# Patient Record
Sex: Female | Born: 1945 | Race: White | Hispanic: No | Marital: Married | Smoking: Current every day smoker
Health system: Southern US, Community
[De-identification: ages and names within clinical notes are randomized; demographics above are authoritative.]

## PROBLEM LIST (undated history)

## (undated) DIAGNOSIS — I2510 Atherosclerotic heart disease of native coronary artery without angina pectoris: Secondary | ICD-10-CM

## (undated) DIAGNOSIS — F419 Anxiety disorder, unspecified: Secondary | ICD-10-CM

## (undated) DIAGNOSIS — E119 Type 2 diabetes mellitus without complications: Secondary | ICD-10-CM

## (undated) DIAGNOSIS — R062 Wheezing: Secondary | ICD-10-CM

## (undated) DIAGNOSIS — K589 Irritable bowel syndrome without diarrhea: Secondary | ICD-10-CM

## (undated) DIAGNOSIS — I10 Essential (primary) hypertension: Secondary | ICD-10-CM

## (undated) DIAGNOSIS — M720 Palmar fascial fibromatosis [Dupuytren]: Secondary | ICD-10-CM

## (undated) DIAGNOSIS — K219 Gastro-esophageal reflux disease without esophagitis: Secondary | ICD-10-CM

## (undated) DIAGNOSIS — M1990 Unspecified osteoarthritis, unspecified site: Secondary | ICD-10-CM

## (undated) DIAGNOSIS — Z9889 Other specified postprocedural states: Secondary | ICD-10-CM

## (undated) DIAGNOSIS — E559 Vitamin D deficiency, unspecified: Secondary | ICD-10-CM

## (undated) DIAGNOSIS — Z22322 Carrier or suspected carrier of Methicillin resistant Staphylococcus aureus: Secondary | ICD-10-CM

## (undated) DIAGNOSIS — I5022 Chronic systolic (congestive) heart failure: Secondary | ICD-10-CM

## (undated) DIAGNOSIS — I219 Acute myocardial infarction, unspecified: Secondary | ICD-10-CM

## (undated) DIAGNOSIS — F32A Depression, unspecified: Secondary | ICD-10-CM

## (undated) DIAGNOSIS — J449 Chronic obstructive pulmonary disease, unspecified: Secondary | ICD-10-CM

## (undated) DIAGNOSIS — G709 Myoneural disorder, unspecified: Secondary | ICD-10-CM

## (undated) DIAGNOSIS — E11319 Type 2 diabetes mellitus with unspecified diabetic retinopathy without macular edema: Secondary | ICD-10-CM

## (undated) DIAGNOSIS — B019 Varicella without complication: Secondary | ICD-10-CM

## (undated) DIAGNOSIS — M810 Age-related osteoporosis without current pathological fracture: Secondary | ICD-10-CM

## (undated) DIAGNOSIS — E785 Hyperlipidemia, unspecified: Secondary | ICD-10-CM

## (undated) DIAGNOSIS — C4490 Unspecified malignant neoplasm of skin, unspecified: Secondary | ICD-10-CM

## (undated) DIAGNOSIS — I639 Cerebral infarction, unspecified: Secondary | ICD-10-CM

## (undated) DIAGNOSIS — J40 Bronchitis, not specified as acute or chronic: Secondary | ICD-10-CM

## (undated) DIAGNOSIS — I5189 Other ill-defined heart diseases: Secondary | ICD-10-CM

## (undated) DIAGNOSIS — N390 Urinary tract infection, site not specified: Secondary | ICD-10-CM

## (undated) DIAGNOSIS — I4510 Unspecified right bundle-branch block: Secondary | ICD-10-CM

## (undated) DIAGNOSIS — G459 Transient cerebral ischemic attack, unspecified: Secondary | ICD-10-CM

## (undated) DIAGNOSIS — J189 Pneumonia, unspecified organism: Secondary | ICD-10-CM

## (undated) DIAGNOSIS — F329 Major depressive disorder, single episode, unspecified: Secondary | ICD-10-CM

## (undated) HISTORY — DX: Vitamin D deficiency, unspecified: E55.9

## (undated) HISTORY — DX: Pneumonia, unspecified organism: J18.9

## (undated) HISTORY — DX: Age-related osteoporosis without current pathological fracture: M81.0

## (undated) HISTORY — DX: Hyperlipidemia, unspecified: E78.5

## (undated) HISTORY — DX: Type 2 diabetes mellitus without complications: E11.9

## (undated) HISTORY — PX: COLONOSCOPY: SHX5424

## (undated) HISTORY — DX: Major depressive disorder, single episode, unspecified: F32.9

## (undated) HISTORY — DX: Irritable bowel syndrome, unspecified: K58.9

## (undated) HISTORY — DX: Other specified postprocedural states: Z98.890

## (undated) HISTORY — DX: Type 2 diabetes mellitus with unspecified diabetic retinopathy without macular edema: E11.319

## (undated) HISTORY — DX: Varicella without complication: B01.9

## (undated) HISTORY — DX: Carrier or suspected carrier of methicillin resistant Staphylococcus aureus: Z22.322

## (undated) HISTORY — DX: Palmar fascial fibromatosis (dupuytren): M72.0

## (undated) HISTORY — DX: Depression, unspecified: F32.A

## (undated) HISTORY — PX: BACK SURGERY: SHX140

## (undated) HISTORY — PX: CATARACT EXTRACTION, BILATERAL: SHX1313

## (undated) HISTORY — PX: REPAIR DURAL / CSF LEAK: SUR1169

## (undated) HISTORY — PX: EYE SURGERY: SHX253

## (undated) HISTORY — DX: Cerebral infarction, unspecified: I63.9

---

## 1992-03-08 DIAGNOSIS — I219 Acute myocardial infarction, unspecified: Secondary | ICD-10-CM

## 1992-03-08 HISTORY — DX: Acute myocardial infarction, unspecified: I21.9

## 1992-03-08 HISTORY — PX: CARDIAC CATHETERIZATION: SHX172

## 1992-03-08 HISTORY — PX: CORONARY ANGIOPLASTY: SHX604

## 1997-10-21 ENCOUNTER — Encounter: Admission: RE | Admit: 1997-10-21 | Discharge: 1998-01-19 | Payer: Self-pay | Admitting: Family Medicine

## 1998-05-05 ENCOUNTER — Ambulatory Visit (HOSPITAL_COMMUNITY): Admission: RE | Admit: 1998-05-05 | Discharge: 1998-05-05 | Payer: Self-pay | Admitting: Orthopedic Surgery

## 1998-05-05 ENCOUNTER — Encounter: Payer: Self-pay | Admitting: Orthopedic Surgery

## 1998-05-06 ENCOUNTER — Encounter: Payer: Self-pay | Admitting: Orthopedic Surgery

## 1998-05-06 ENCOUNTER — Ambulatory Visit (HOSPITAL_COMMUNITY): Admission: RE | Admit: 1998-05-06 | Discharge: 1998-05-06 | Payer: Self-pay | Admitting: Orthopedic Surgery

## 1999-10-17 ENCOUNTER — Encounter: Payer: Self-pay | Admitting: Emergency Medicine

## 1999-10-17 ENCOUNTER — Emergency Department (HOSPITAL_COMMUNITY): Admission: EM | Admit: 1999-10-17 | Discharge: 1999-10-17 | Payer: Self-pay | Admitting: Emergency Medicine

## 1999-10-23 ENCOUNTER — Encounter: Admission: RE | Admit: 1999-10-23 | Discharge: 1999-10-23 | Payer: Self-pay | Admitting: Family Medicine

## 1999-10-23 ENCOUNTER — Encounter: Payer: Self-pay | Admitting: Family Medicine

## 1999-11-04 ENCOUNTER — Encounter: Admission: RE | Admit: 1999-11-04 | Discharge: 1999-11-04 | Payer: Self-pay | Admitting: Family Medicine

## 1999-11-04 ENCOUNTER — Encounter: Payer: Self-pay | Admitting: Family Medicine

## 1999-12-09 ENCOUNTER — Encounter: Admission: RE | Admit: 1999-12-09 | Discharge: 1999-12-09 | Payer: Self-pay | Admitting: Family Medicine

## 1999-12-09 ENCOUNTER — Encounter: Payer: Self-pay | Admitting: Family Medicine

## 1999-12-10 ENCOUNTER — Inpatient Hospital Stay (HOSPITAL_COMMUNITY): Admission: AD | Admit: 1999-12-10 | Discharge: 1999-12-16 | Payer: Self-pay | Admitting: Family Medicine

## 1999-12-11 ENCOUNTER — Encounter: Payer: Self-pay | Admitting: Family Medicine

## 1999-12-13 ENCOUNTER — Encounter: Payer: Self-pay | Admitting: Family Medicine

## 1999-12-15 ENCOUNTER — Encounter: Payer: Self-pay | Admitting: Family Medicine

## 1999-12-24 ENCOUNTER — Encounter: Admission: RE | Admit: 1999-12-24 | Discharge: 1999-12-24 | Payer: Self-pay | Admitting: Family Medicine

## 2000-02-23 ENCOUNTER — Encounter: Admission: RE | Admit: 2000-02-23 | Discharge: 2000-02-23 | Payer: Self-pay | Admitting: Family Medicine

## 2000-02-23 ENCOUNTER — Encounter: Payer: Self-pay | Admitting: Family Medicine

## 2000-02-29 ENCOUNTER — Encounter: Payer: Self-pay | Admitting: Neurological Surgery

## 2000-02-29 ENCOUNTER — Ambulatory Visit (HOSPITAL_COMMUNITY): Admission: RE | Admit: 2000-02-29 | Discharge: 2000-02-29 | Payer: Self-pay | Admitting: Neurological Surgery

## 2000-05-04 ENCOUNTER — Encounter: Admission: RE | Admit: 2000-05-04 | Discharge: 2000-05-04 | Payer: Self-pay | Admitting: Neurological Surgery

## 2000-05-04 ENCOUNTER — Encounter: Payer: Self-pay | Admitting: Neurological Surgery

## 2000-05-11 ENCOUNTER — Encounter: Admission: RE | Admit: 2000-05-11 | Discharge: 2000-06-27 | Payer: Self-pay | Admitting: Neurological Surgery

## 2000-07-05 ENCOUNTER — Encounter: Payer: Self-pay | Admitting: Neurological Surgery

## 2000-07-05 ENCOUNTER — Ambulatory Visit (HOSPITAL_COMMUNITY): Admission: RE | Admit: 2000-07-05 | Discharge: 2000-07-05 | Payer: Self-pay | Admitting: Neurological Surgery

## 2002-04-24 ENCOUNTER — Ambulatory Visit (HOSPITAL_COMMUNITY): Admission: RE | Admit: 2002-04-24 | Discharge: 2002-04-24 | Payer: Self-pay | Admitting: Family Medicine

## 2002-06-11 ENCOUNTER — Other Ambulatory Visit: Admission: RE | Admit: 2002-06-11 | Discharge: 2002-06-11 | Payer: Self-pay | Admitting: Family Medicine

## 2002-06-20 ENCOUNTER — Encounter: Admission: RE | Admit: 2002-06-20 | Discharge: 2002-06-20 | Payer: Self-pay | Admitting: Family Medicine

## 2002-06-20 ENCOUNTER — Encounter: Payer: Self-pay | Admitting: Family Medicine

## 2004-09-17 ENCOUNTER — Other Ambulatory Visit: Admission: RE | Admit: 2004-09-17 | Discharge: 2004-09-17 | Payer: Self-pay | Admitting: Family Medicine

## 2006-01-17 ENCOUNTER — Other Ambulatory Visit: Admission: RE | Admit: 2006-01-17 | Discharge: 2006-01-17 | Payer: Self-pay | Admitting: Family Medicine

## 2009-03-08 DIAGNOSIS — Z9889 Other specified postprocedural states: Secondary | ICD-10-CM

## 2009-03-08 DIAGNOSIS — M720 Palmar fascial fibromatosis [Dupuytren]: Secondary | ICD-10-CM

## 2009-03-08 HISTORY — PX: DUPUYTREN CONTRACTURE RELEASE: SHX1478

## 2009-03-08 HISTORY — PX: HAND SURGERY: SHX662

## 2009-03-08 HISTORY — DX: Palmar fascial fibromatosis (dupuytren): M72.0

## 2009-03-08 HISTORY — DX: Other specified postprocedural states: Z98.890

## 2010-05-15 ENCOUNTER — Ambulatory Visit: Payer: Self-pay

## 2010-06-23 ENCOUNTER — Ambulatory Visit: Payer: Self-pay | Admitting: Physician Assistant

## 2012-03-08 DIAGNOSIS — G459 Transient cerebral ischemic attack, unspecified: Secondary | ICD-10-CM

## 2012-03-08 DIAGNOSIS — J189 Pneumonia, unspecified organism: Secondary | ICD-10-CM

## 2012-03-08 HISTORY — DX: Transient cerebral ischemic attack, unspecified: G45.9

## 2012-03-08 HISTORY — DX: Pneumonia, unspecified organism: J18.9

## 2012-07-24 DIAGNOSIS — J189 Pneumonia, unspecified organism: Secondary | ICD-10-CM | POA: Insufficient documentation

## 2012-07-24 DIAGNOSIS — I259 Chronic ischemic heart disease, unspecified: Secondary | ICD-10-CM | POA: Insufficient documentation

## 2012-07-24 DIAGNOSIS — E119 Type 2 diabetes mellitus without complications: Secondary | ICD-10-CM | POA: Insufficient documentation

## 2012-07-24 DIAGNOSIS — I240 Acute coronary thrombosis not resulting in myocardial infarction: Secondary | ICD-10-CM | POA: Insufficient documentation

## 2012-07-24 DIAGNOSIS — R0902 Hypoxemia: Secondary | ICD-10-CM | POA: Insufficient documentation

## 2012-07-30 DIAGNOSIS — I5022 Chronic systolic (congestive) heart failure: Secondary | ICD-10-CM | POA: Insufficient documentation

## 2012-08-17 DIAGNOSIS — E785 Hyperlipidemia, unspecified: Secondary | ICD-10-CM | POA: Insufficient documentation

## 2012-08-17 DIAGNOSIS — J449 Chronic obstructive pulmonary disease, unspecified: Secondary | ICD-10-CM | POA: Insufficient documentation

## 2012-08-17 DIAGNOSIS — M810 Age-related osteoporosis without current pathological fracture: Secondary | ICD-10-CM | POA: Insufficient documentation

## 2012-08-17 DIAGNOSIS — Z794 Long term (current) use of insulin: Secondary | ICD-10-CM | POA: Insufficient documentation

## 2012-08-17 DIAGNOSIS — E113293 Type 2 diabetes mellitus with mild nonproliferative diabetic retinopathy without macular edema, bilateral: Secondary | ICD-10-CM | POA: Insufficient documentation

## 2012-08-17 DIAGNOSIS — E1169 Type 2 diabetes mellitus with other specified complication: Secondary | ICD-10-CM | POA: Insufficient documentation

## 2012-08-17 DIAGNOSIS — D649 Anemia, unspecified: Secondary | ICD-10-CM | POA: Insufficient documentation

## 2012-11-23 DIAGNOSIS — Z86010 Personal history of colonic polyps: Secondary | ICD-10-CM | POA: Insufficient documentation

## 2012-11-23 DIAGNOSIS — Z7982 Long term (current) use of aspirin: Secondary | ICD-10-CM | POA: Insufficient documentation

## 2012-11-23 DIAGNOSIS — Z8673 Personal history of transient ischemic attack (TIA), and cerebral infarction without residual deficits: Secondary | ICD-10-CM | POA: Insufficient documentation

## 2012-11-23 DIAGNOSIS — I4510 Unspecified right bundle-branch block: Secondary | ICD-10-CM | POA: Insufficient documentation

## 2012-11-23 DIAGNOSIS — I252 Old myocardial infarction: Secondary | ICD-10-CM | POA: Insufficient documentation

## 2012-11-23 DIAGNOSIS — Z9861 Coronary angioplasty status: Secondary | ICD-10-CM | POA: Insufficient documentation

## 2013-06-04 ENCOUNTER — Ambulatory Visit: Payer: Self-pay | Admitting: Ophthalmology

## 2013-06-04 DIAGNOSIS — I10 Essential (primary) hypertension: Secondary | ICD-10-CM

## 2013-06-04 LAB — HEMOGLOBIN: HGB: 10.3 g/dL — ABNORMAL LOW (ref 12.0–16.0)

## 2013-06-04 LAB — POTASSIUM: Potassium: 4.3 mmol/L (ref 3.5–5.1)

## 2013-06-11 ENCOUNTER — Ambulatory Visit: Payer: Self-pay | Admitting: Ophthalmology

## 2013-10-25 ENCOUNTER — Ambulatory Visit: Payer: Self-pay | Admitting: Ophthalmology

## 2013-10-25 LAB — POTASSIUM: Potassium: 4.2 mmol/L (ref 3.5–5.1)

## 2013-10-25 LAB — HEMOGLOBIN: HGB: 10 g/dL — ABNORMAL LOW (ref 12.0–16.0)

## 2013-11-05 ENCOUNTER — Ambulatory Visit: Payer: Self-pay | Admitting: Ophthalmology

## 2013-11-06 HISTORY — PX: HIP FRACTURE SURGERY: SHX118

## 2013-11-25 ENCOUNTER — Inpatient Hospital Stay: Payer: Self-pay | Admitting: Internal Medicine

## 2013-11-25 LAB — CBC WITH DIFFERENTIAL/PLATELET
Basophil #: 0 10*3/uL (ref 0.0–0.1)
Basophil %: 0.4 %
Eosinophil #: 0 10*3/uL (ref 0.0–0.7)
Eosinophil %: 0.1 %
HCT: 36.7 % (ref 35.0–47.0)
HGB: 11.8 g/dL — ABNORMAL LOW (ref 12.0–16.0)
Lymphocyte #: 0.7 10*3/uL — ABNORMAL LOW (ref 1.0–3.6)
Lymphocyte %: 8.4 %
MCH: 29.6 pg (ref 26.0–34.0)
MCHC: 32.1 g/dL (ref 32.0–36.0)
MCV: 92 fL (ref 80–100)
Monocyte #: 0.3 x10 3/mm (ref 0.2–0.9)
Monocyte %: 3.9 %
Neutrophil #: 7.5 10*3/uL — ABNORMAL HIGH (ref 1.4–6.5)
Neutrophil %: 87.2 %
Platelet: 198 10*3/uL (ref 150–440)
RBC: 3.97 10*6/uL (ref 3.80–5.20)
RDW: 14.7 % — ABNORMAL HIGH (ref 11.5–14.5)
WBC: 8.6 10*3/uL (ref 3.6–11.0)

## 2013-11-25 LAB — BASIC METABOLIC PANEL
Anion Gap: 6 — ABNORMAL LOW (ref 7–16)
BUN: 45 mg/dL — ABNORMAL HIGH (ref 7–18)
Calcium, Total: 8.9 mg/dL (ref 8.5–10.1)
Chloride: 97 mmol/L — ABNORMAL LOW (ref 98–107)
Co2: 31 mmol/L (ref 21–32)
Creatinine: 1.52 mg/dL — ABNORMAL HIGH (ref 0.60–1.30)
EGFR (African American): 40 — ABNORMAL LOW
EGFR (Non-African Amer.): 35 — ABNORMAL LOW
Glucose: 341 mg/dL — ABNORMAL HIGH (ref 65–99)
Osmolality: 293 (ref 275–301)
Potassium: 6.2 mmol/L — ABNORMAL HIGH (ref 3.5–5.1)
Sodium: 134 mmol/L — ABNORMAL LOW (ref 136–145)

## 2013-11-25 LAB — POTASSIUM: POTASSIUM: 5.5 mmol/L — AB (ref 3.5–5.1)

## 2013-11-26 LAB — CBC WITH DIFFERENTIAL/PLATELET
BASOS PCT: 0.6 %
Basophil #: 0 10*3/uL (ref 0.0–0.1)
Eosinophil #: 0.1 10*3/uL (ref 0.0–0.7)
Eosinophil %: 1.5 %
HCT: 29 % — AB (ref 35.0–47.0)
HGB: 9.3 g/dL — ABNORMAL LOW (ref 12.0–16.0)
Lymphocyte #: 1.1 10*3/uL (ref 1.0–3.6)
Lymphocyte %: 17 %
MCH: 29.6 pg (ref 26.0–34.0)
MCHC: 32 g/dL (ref 32.0–36.0)
MCV: 92 fL (ref 80–100)
Monocyte #: 0.6 x10 3/mm (ref 0.2–0.9)
Monocyte %: 9.2 %
Neutrophil #: 4.8 10*3/uL (ref 1.4–6.5)
Neutrophil %: 71.7 %
PLATELETS: 184 10*3/uL (ref 150–440)
RBC: 3.14 10*6/uL — AB (ref 3.80–5.20)
RDW: 14.7 % — ABNORMAL HIGH (ref 11.5–14.5)
WBC: 6.7 10*3/uL (ref 3.6–11.0)

## 2013-11-26 LAB — BASIC METABOLIC PANEL
Anion Gap: 6 — ABNORMAL LOW (ref 7–16)
BUN: 44 mg/dL — ABNORMAL HIGH (ref 7–18)
CALCIUM: 7.8 mg/dL — AB (ref 8.5–10.1)
Chloride: 105 mmol/L (ref 98–107)
Co2: 26 mmol/L (ref 21–32)
Creatinine: 2.14 mg/dL — ABNORMAL HIGH (ref 0.60–1.30)
EGFR (African American): 27 — ABNORMAL LOW
EGFR (Non-African Amer.): 23 — ABNORMAL LOW
GLUCOSE: 196 mg/dL — AB (ref 65–99)
OSMOLALITY: 290 (ref 275–301)
Potassium: 5 mmol/L (ref 3.5–5.1)
Sodium: 137 mmol/L (ref 136–145)

## 2013-11-26 LAB — HEMOGLOBIN A1C: Hemoglobin A1C: 7.5 % — ABNORMAL HIGH (ref 4.2–6.3)

## 2013-11-27 LAB — CBC WITH DIFFERENTIAL/PLATELET
Basophil #: 0 10*3/uL (ref 0.0–0.1)
Basophil %: 0.5 %
EOS PCT: 2.9 %
Eosinophil #: 0.2 10*3/uL (ref 0.0–0.7)
HCT: 25.2 % — AB (ref 35.0–47.0)
HGB: 7.7 g/dL — ABNORMAL LOW (ref 12.0–16.0)
LYMPHS ABS: 1 10*3/uL (ref 1.0–3.6)
Lymphocyte %: 16 %
MCH: 28.4 pg (ref 26.0–34.0)
MCHC: 30.6 g/dL — ABNORMAL LOW (ref 32.0–36.0)
MCV: 93 fL (ref 80–100)
MONOS PCT: 10.4 %
Monocyte #: 0.7 x10 3/mm (ref 0.2–0.9)
NEUTROS PCT: 70.2 %
Neutrophil #: 4.5 10*3/uL (ref 1.4–6.5)
PLATELETS: 148 10*3/uL — AB (ref 150–440)
RBC: 2.71 10*6/uL — AB (ref 3.80–5.20)
RDW: 14.7 % — AB (ref 11.5–14.5)
WBC: 6.4 10*3/uL (ref 3.6–11.0)

## 2013-11-27 LAB — BASIC METABOLIC PANEL
ANION GAP: 3 — AB (ref 7–16)
BUN: 40 mg/dL — ABNORMAL HIGH (ref 7–18)
Calcium, Total: 7.4 mg/dL — ABNORMAL LOW (ref 8.5–10.1)
Chloride: 105 mmol/L (ref 98–107)
Co2: 27 mmol/L (ref 21–32)
Creatinine: 1.77 mg/dL — ABNORMAL HIGH (ref 0.60–1.30)
GFR CALC AF AMER: 34 — AB
GFR CALC NON AF AMER: 29 — AB
GLUCOSE: 258 mg/dL — AB (ref 65–99)
OSMOLALITY: 289 (ref 275–301)
Potassium: 4.9 mmol/L (ref 3.5–5.1)
Sodium: 135 mmol/L — ABNORMAL LOW (ref 136–145)

## 2013-11-28 LAB — CBC WITH DIFFERENTIAL/PLATELET
Basophil #: 0 10*3/uL (ref 0.0–0.1)
Basophil %: 0.3 %
EOS ABS: 0.2 10*3/uL (ref 0.0–0.7)
Eosinophil %: 2.2 %
HCT: 27.8 % — ABNORMAL LOW (ref 35.0–47.0)
HGB: 8.8 g/dL — AB (ref 12.0–16.0)
Lymphocyte #: 1.1 10*3/uL (ref 1.0–3.6)
Lymphocyte %: 12.6 %
MCH: 29.3 pg (ref 26.0–34.0)
MCHC: 31.5 g/dL — AB (ref 32.0–36.0)
MCV: 93 fL (ref 80–100)
MONOS PCT: 7.3 %
Monocyte #: 0.6 x10 3/mm (ref 0.2–0.9)
NEUTROS PCT: 77.6 %
Neutrophil #: 6.4 10*3/uL (ref 1.4–6.5)
Platelet: 159 10*3/uL (ref 150–440)
RBC: 2.99 10*6/uL — ABNORMAL LOW (ref 3.80–5.20)
RDW: 14.6 % — ABNORMAL HIGH (ref 11.5–14.5)
WBC: 8.3 10*3/uL (ref 3.6–11.0)

## 2013-11-28 LAB — BASIC METABOLIC PANEL
Anion Gap: 6 — ABNORMAL LOW (ref 7–16)
BUN: 35 mg/dL — ABNORMAL HIGH (ref 7–18)
CHLORIDE: 103 mmol/L (ref 98–107)
CO2: 26 mmol/L (ref 21–32)
CREATININE: 1.42 mg/dL — AB (ref 0.60–1.30)
Calcium, Total: 8.1 mg/dL — ABNORMAL LOW (ref 8.5–10.1)
GFR CALC AF AMER: 44 — AB
GFR CALC NON AF AMER: 38 — AB
Glucose: 309 mg/dL — ABNORMAL HIGH (ref 65–99)
OSMOLALITY: 290 (ref 275–301)
Potassium: 5 mmol/L (ref 3.5–5.1)
Sodium: 135 mmol/L — ABNORMAL LOW (ref 136–145)

## 2013-11-29 ENCOUNTER — Encounter: Payer: Self-pay | Admitting: Internal Medicine

## 2013-12-06 ENCOUNTER — Encounter: Payer: Self-pay | Admitting: Internal Medicine

## 2013-12-14 ENCOUNTER — Ambulatory Visit: Payer: Self-pay | Admitting: Gerontology

## 2014-01-01 LAB — CBC WITH DIFFERENTIAL/PLATELET
Basophil #: 0 10*3/uL (ref 0.0–0.1)
Basophil %: 0.5 %
EOS ABS: 1.1 10*3/uL — AB (ref 0.0–0.7)
Eosinophil %: 15.4 %
HCT: 30.1 % — AB (ref 35.0–47.0)
HGB: 9.1 g/dL — ABNORMAL LOW (ref 12.0–16.0)
LYMPHS ABS: 1.2 10*3/uL (ref 1.0–3.6)
LYMPHS PCT: 16.7 %
MCH: 27.7 pg (ref 26.0–34.0)
MCHC: 30.4 g/dL — ABNORMAL LOW (ref 32.0–36.0)
MCV: 91 fL (ref 80–100)
MONOS PCT: 8.3 %
Monocyte #: 0.6 x10 3/mm (ref 0.2–0.9)
NEUTROS PCT: 59.1 %
Neutrophil #: 4.2 10*3/uL (ref 1.4–6.5)
PLATELETS: 261 10*3/uL (ref 150–440)
RBC: 3.3 10*6/uL — AB (ref 3.80–5.20)
RDW: 16.5 % — ABNORMAL HIGH (ref 11.5–14.5)
WBC: 7 10*3/uL (ref 3.6–11.0)

## 2014-01-01 LAB — BASIC METABOLIC PANEL
Anion Gap: 8 (ref 7–16)
BUN: 17 mg/dL (ref 7–18)
CALCIUM: 8.3 mg/dL — AB (ref 8.5–10.1)
CO2: 30 mmol/L (ref 21–32)
CREATININE: 1.02 mg/dL (ref 0.60–1.30)
Chloride: 105 mmol/L (ref 98–107)
EGFR (Non-African Amer.): 57 — ABNORMAL LOW
Glucose: 136 mg/dL — ABNORMAL HIGH (ref 65–99)
Osmolality: 289 (ref 275–301)
POTASSIUM: 4.2 mmol/L (ref 3.5–5.1)
Sodium: 143 mmol/L (ref 136–145)

## 2014-01-01 LAB — TSH: THYROID STIMULATING HORM: 5.93 u[IU]/mL — AB

## 2014-01-06 ENCOUNTER — Encounter: Payer: Self-pay | Admitting: Internal Medicine

## 2014-01-16 LAB — URINALYSIS, COMPLETE
Bacteria: NONE SEEN
Bilirubin,UR: NEGATIVE
Glucose,UR: NEGATIVE mg/dL (ref 0–75)
Hyaline Cast: 2
Ketone: NEGATIVE
Leukocyte Esterase: NEGATIVE
Nitrite: NEGATIVE
Ph: 6 (ref 4.5–8.0)
Protein: NEGATIVE
RBC,UR: <1 /HPF (ref 0–5)
SPECIFIC GRAVITY: 1.009 (ref 1.003–1.030)
Squamous Epithelial: 1
WBC UR: 1 /HPF (ref 0–5)

## 2014-01-18 LAB — URINE CULTURE

## 2014-02-05 ENCOUNTER — Encounter: Payer: Self-pay | Admitting: Internal Medicine

## 2014-03-08 ENCOUNTER — Encounter: Payer: Self-pay | Admitting: Internal Medicine

## 2014-06-29 NOTE — Consult Note (Signed)
PATIENT NAME:  Andrea [name], Andrea [name] MR#:  [id number] DATE OF BIRTH:  03/05/1946  DATE OF CONSULTATION:  11/25/2013  REFERRING PHYSICIAN:   CONSULTING PHYSICIAN:  Laurene Footman, MD  REASON FOR CONSULTATION: Left hip fracture.   HISTORY OF PRESENT ILLNESS: The patient is a 69 year old female with a history of diabetes and peripheral neuropathy. She slipped at home when she reached out to go outside her home, fell after losing her balance. She had immediate pain in her hip. She went to the Emergency Room in [facility] and had hip fracture and she is transferred here per her request as [facility] does not have orthopedic surgery coverage. She has a history of chronic back problems and apparently some significant peripheral neuropathy as well as significant health problems related to diabetes and heart disease. She has been ambulatory in the community without assisted device.  X-rays are not available and an AP x-ray has been ordered.  Her left leg is quite shortened, approximately 3 to 4 inches on the left, externally rotated. She is able to feel light touch to the foot and has trace dorsalis pedis and posterior tibialis pulses. Skin is intact.  CLINICAL IMPRESSION:  Comminuted intertrochanteric hip fracture on the left based on  x-ray report.   PLAN:  Obtain an x-ray and open reduction and internal fixation later today. The risks, benefits, and possible complications were discussed. Plan on cephalomedullary device unless x-ray shows something unexpected.     ____________________________ Laurene Footman, MD mjm:nr D: 11/25/2013 10:22:45 ET T: 11/25/2013 11:33:07 ET JOB#: [id number]  cc: Laurene Footman, MD, <Dictator> Laurene Footman MD ELECTRONICALLY SIGNED 11/25/2013 13:25

## 2014-06-29 NOTE — Op Note (Signed)
PATIENT NAME:  Andrea [name], Andrea [name] MR#:  [id number] DATE OF BIRTH:  05-Feb-1946  DATE OF PROCEDURE:  06/11/2013  PREOPERATIVE DIAGNOSIS: Cataract, left eye.   POSTOPERATIVE DIAGNOSIS: Cataract, left eye.  PROCEDURE PERFORMED: Extracapsular cataract extraction using phacoemulsification with placement Alcon [id number], 23.0 diopter posterior chamber lens, serial number [id number].   SURGEON: Loura Back. Serina Nichter, M.D.   ANESTHESIA: 4% lidocaine, 0.75% Marcaine a 50-50 mixture with 10 units/mL of HyoMax added, given as a peribulbar.   ANESTHESIOLOGIST: Dr. Benjamine Mola.   COMPLICATIONS: None.   ESTIMATED BLOOD LOSS: Less than 1 mL.   DESCRIPTION OF PROCEDURE:  The patient was brought to the operating room and given a peribulbar block.  The patient was then prepped and draped in the usual fashion.  The vertical rectus muscles were imbricated using 5-0 silk sutures.  These sutures were then clamped to the sterile drapes as bridle sutures.  A limbal peritomy was performed extending two clock hours and hemostasis was obtained with cautery.  A partial thickness scleral groove was made at the surgical limbus and dissected anteriorly in a lamellar dissection using an Alcon crescent knife.  The anterior chamber was entered supero-temporally with a Superblade and through the lamellar dissection with a 2.6 mm keratome.  DisCoVisc was used to replace the aqueous and a continuous tear capsulorrhexis was carried out.  Hydrodissection and hydrodelineation were carried out with balanced salt and a 27 gauge canula.  The nucleus was rotated to confirm the effectiveness of the hydrodissection.  Phacoemulsification was carried out using a divide-and-conquer technique.  Total ultrasound time was 1 minute and 12 seconds with an average power of 23.7 percent. CDE 30.18.  Irrigation/aspiration was used to remove the residual cortex.  DisCoVisc was used to inflate the capsule and the internal incision was enlarged to 3 mm with the  crescent knife.  The intraocular lens was folded and inserted into the capsular bag using the Acrysert delivery system.  Irrigation/aspiration was used to remove the residual DisCoVisc.  Miostat was injected into the anterior chamber through the paracentesis track to inflate the anterior chamber and induce miosis. A tenth of a milliliter of cefuroxime containing 1 mg of drug was injected via the paracentesis track The wound was checked for leaks and wound leakage was found.  A single 10-0 suture was placed across the incision, tied and the knot was rotated superiorly.  The conjunctiva was closed with cautery and the bridle sutures were removed.  Two drops of 0.3% Vigamox were placed on the eye.   An eye shield was placed on the eye.  The patient was discharged to the recovery room in good condition.     ____________________________ Loura Back Dymir Neeson, MD sad:sg D: 06/11/2013 12:07:17 ET T: 06/11/2013 12:21:45 ET JOB#: [id number]  cc: Remo Lipps A. Donjuan Robison, MD, <Dictator> Martie Lee MD ELECTRONICALLY SIGNED 06/18/2013 12:23

## 2014-06-29 NOTE — H&P (Signed)
PATIENT NAME:  Andrea [name], Andrea [name] MR#:  [id number] DATE OF BIRTH:  1945-04-25  DATE OF ADMISSION:  11/25/2013  REFERRING PHYSICIAN: Dr. Alvester Morin from [facility].   PRIMARY CARE PHYSICIAN: Dr. Cyndi Bender.   ADMISSION DIAGNOSIS: Hip fracture.   HISTORY OF PRESENT ILLNESS: This is a 69 year old Caucasian female who is accepted in transfer from [facility] where she was evaluated in Emergency Department after a fall. The patient states that she was reaching for and opened door and lost her balance. She struck her left hip and immediately felt excruciating pain. She could not stand or move and so she called EMS for transport. X-rays in the Emergency Department show a significantly comminuted left hip fracture. At that hospital they do not have orthopedic surgical services and so the patient was requested transfer to [facility] where she is closer to family.   REVIEW OF SYSTEMS:   CONSTITUTIONAL: The patient denies fever or weakness.  EYES: Denies blurred vision or inflammation.  EARS, NOSE AND THROAT: Denies tinnitus or sore throat.  RESPIRATORY: Denies cough or wheezing.  CARDIOVASCULAR: Denies chest pain, orthopnea,  GASTROINTESTINAL: The patient admits to an episode of nausea in the Emergency Department at the outside hospital following fentanyl injection. She denies abdominal pain.  GENITOURINARY: The patient denies dysuria, increased frequency or hesitancy.  ENDOCRINE: The patient denies polyuria or polydipsia.  HEMATOLOGIC AND LYMPHATIC: The patient denies easy bruising or bleeding.  INTEGUMENT: Denies rashes or lesions.  MUSCULOSKELETAL: Admits to chronic back pain as well as right hip pain. She does not complain of muscle aches.  NEUROLOGIC: The patient denies numbness in her extremities or dysarthria.  PSYCHIATRIC: The patient denies depression or suicidal ideation.   PAST MEDICAL HISTORY: Significant for coronary artery disease, diabetes type 2, osteoporosis, chronic  back pain and Dupuytren contractures as well as coronary artery disease. The patient had a myocardial infarction approximately 3 years ago.   PAST SURGICAL HISTORY: The patient had surgery on her left hand, as well as bilateral cataract removal.   FAMILY HISTORY: Diabetes specific particularly in her daughter.   SOCIAL HISTORY: The patient is a former smoker. She does not drink or use any illegal drugs.   MEDICATIONS: Aspirin 81 mg 1 tablet p.o. daily, atorvastatin 40 mg 1 tab p.o. daily, enalapril 2.5 mg 1 tab p.o. daily, gabapentin 600 mg p.o. t.i.d., Levemir the patient cannot remember her dose, fast acting insulin the patient also cannot remember her dose, Timolol 0.5% ophthalmic drops one drop to both eyes b.i.d. The patient is also on Lasix, but she cannot remember the dose or directions.   ALLERGIES: No known drug allergies.   LABORATORIES: The patient does not have any lab values at this time, but radiology results from the outside hospital show a comminuted left femoral intertrochanteric fracture with up to 6 cm of shortening in the fracture site and mild rotation of the distal femur, a displaced lesser trochanter fragment is seen. The left femoral head remains in the acetabulum. No additional fractures are seen.   PHYSICAL EXAMINATION:  VITAL SIGNS: Temperature is 97.5, pulse 83, respirations 22, blood pressure 144/81, pulse oximetry 90% on 2 liters of oxygen via nasal cannula.  GENERAL: The patient is alert and oriented. She is moaning in a lot of distress anytime she tries to move.  HEENT: Normocephalic, atraumatic. Pupils equal, round, and reactive to light and accommodation. Extraocular movements are intact. Mucous membranes are dry.  NECK: Trachea is midline. No adenopathy.  CHEST: Symmetric and  atraumatic.  CARDIOVASCULAR: Regular rate and rhythm. Normal S1, S2. No rubs, clicks, or murmurs.  LUNGS: Clear to auscultation bilaterally. Normal effort and excursion.  ABDOMEN:  Positive bowel sounds. Somewhat firm, mildly tender diffusely and some  distention, but no hepatosplenomegaly.  GENITOURINARY: Foley is in place.  MUSCULOSKELETAL: The patient moves her upper extremities equally bilaterally and she has 5/5 strength bilaterally.  She can move her right leg without much difficulty, but this causes a lot of pain to her left leg, which is pinned under the right which is supported by a pillow. Her left leg is externally rotated and flexed at the knee. It is exquisitely tender to touch at the hip.  SKIN: There is skin tear on the right upper extremity. Otherwise there are no rashes or lesions.  NEUROLOGIC: Cranial nerves II through XII grossly intact.  PSYCHIATRIC: Mood is normal albeit uncomfortable and the affect is congruent.   ASSESSMENT AND PLAN: This is a 69 year old female with a left hip fracture.   1. Hip fracture goal right now is pain control. The patient had fentanyl 100 mcg at the outside hospital, as well as 2 mg of Dilaudid. She still complains of pain as to be expected after transport. I have ordered long acting OxyContin 10 mg and IV morphine 4 mg as needed for breakthrough pain. Norco consult has been ordered. Dr. Rudene Christians is aware. She is a moderate risk for surgery at this time given her history of coronary artery disease and myocardial infarction. She is currently a nonsmoker and she has no lung disease. I have ordered chest x-ray for preop evaluation. Labs are pending as well.  2. Coronary artery disease. Continue aspirin, enalapril, atorvastatin for secondary prevention. (Holding enalapril prior to surgery at the discretion of the operating physician). There is some question of ischemic cardiomyopathy as Lasix is on the patient's home medicine list. Certainly this could be in place due to some venous insufficiency or maybe even some diastolic dysfunction. Clinically she does not have any fluid overload, but she is on maintenance fluid at this time and it is  important for [place] to verify her dose so as not to place her at undue risk for surgery from a cardiovascular standpoint.  3. Diabetes type 2. The patient is currently n.p.o. except for medications. I will clarify her fluid orders so that she will get a bolus of normal saline and then D5 half-normal saline for maintenance prior to surgery. I have started her on a basal dose of insulin that is half the calculated dose. She also has sliding scale insulin ordered as needed.  4. Osteoporosis. Check a vitamin D level.  5. Deep vein thrombosis prophylaxis. Heparin.  6. Gastrointestinal prophylaxis. Pantoprazole.  CODE STATUS: The patient is a full code.  TIME SPENT ON ADMISSION ORDERS AND PATIENT CARE: Approximately 35 minutes.     ____________________________ Norva Riffle. Marcille Blanco, MD msd:JT D: 11/25/2013 03:47:04 ET T: 11/25/2013 06:38:16 ET JOB#: [id number]  cc: Norva Riffle. Marcille Blanco, MD, <Dictator> Norva Riffle Esther Broyles MD ELECTRONICALLY SIGNED 11/26/2013 0:04

## 2014-06-29 NOTE — Op Note (Signed)
PATIENT NAME:  Andrea [name], Andrea [name] MR#:  [id number] DATE OF BIRTH:  [birth year]  DATE OF PROCEDURE:  11/25/2013  PREOPERATIVE DIAGNOSIS:  Subtrochanteric-intertrochanteric left hip fracture.   POSTOPERATIVE DIAGNOSIS:  Subtrochanteric-intertrochanteric left hip fracture.   PROCEDURE:  Open reduction and internal fixation with intramedullary device.   ANESTHESIA:  General.   SURGEON:  Hessie Knows, MD   DESCRIPTION OF PROCEDURE:  The patient was brought to the operating room, and after adequate anesthesia was obtained, the patient was placed on the fracture table, the right leg in the well leg holder, left foot in the traction boot with traction applied. C-arm was brought in, and good alignment was obtained in both AP and lateral projections. After prepping and draping using the barrier drape method, appropriate patient identification and timeout procedures were completed. A small proximal incision approximately 5 cm in length was made and the subcutaneous tissue spread. A guidewire was inserted into the tip of the trochanter, and getting appropriate position on the lateral view, proximal reaming was carried out. A guidewire was then placed down the canal past the subtrochanteric extension and measurements made for length determination. Reaming was carried out to 11 mm, at which point there was chatter at the isthmus, and a 9 x 320 mm left long Affixus rod 130 degrees was obtained and assembled on the insertion device. It was then inserted down the canal and set to the appropriate level. The subtrochanteric extension was somewhat displaced prior to reaming, and so a small lateral incision was made and the reduction clamp applied, and this was left in place for reaming and for the rod insertion. It was removed so that the proximal interlock could be placed after drill guide down to the appropriate level. Drill hole was made and a guidewire inserted up into the femoral head on both AP and lateral projections,  drilled, and a 95 mm lag screw was inserted to the appropriate level. The proximal set screw was placed to a level where it was tightened and then backed off a quarter turn to allow for compression. The proximal insertion handle was removed at this point and the leg abducted so that the distal femoral drill hole could be made through the slotted hole. This was made through a small stab incision after localizing that hole with the C-arm. Drilling was carried out followed by placement of a 40 mm screw, which gave good fixation. AP and lateral projections of fractures obtained with internal fixation. The wounds were thoroughly irrigated. The deep fascia was repaired using #1 Vicryl, 2-0 Vicryl subcutaneously, and skin staples. Xeroform, 4 x 4's, ABD, and tape applied.   ESTIMATED BLOOD LOSS:  500.   COMPLICATIONS:  None.   SPECIMEN:  None.   IMPLANTS:  Biomet Affixus hip fracture nail, left, 9 x 320 mm, 130 degrees with a 95 mm lag screw and a 40 mm cortical bone screw distally.   CONDITION:  To recovery room stable.    ____________________________ Laurene Footman, MD mjm:nb D: 11/25/2013 17:54:34 ET T: 11/25/2013 22:29:07 ET JOB#: [id number]  cc: Laurene Footman, MD, <Dictator> Laurene Footman MD ELECTRONICALLY SIGNED 11/26/2013 8:04

## 2014-06-29 NOTE — Discharge Summary (Signed)
Dates of Admission and Diagnosis:  Date of Admission 25-Nov-2013   Date of Discharge 28-Nov-2013   Admitting Diagnosis hip fracture   Final Diagnosis hip fracture Ac blood loss anemia Htn CAD    Chief Complaint/History of Present Illness a 69 year old Caucasian female who is accepted in transfer from [facility] where she was evaluated in Emergency Department after a fall. The patient states that she was reaching for and opened door and lost her balance. She struck her left hip and immediately felt excruciating pain. She could not stand or move and so she called EMS for transport. X-rays in the Emergency Department show a significantly comminuted left hip fracture. At that hospital they do not have orthopedic surgical services and so the patient was requested transfer to [facility] where she is closer to family.   Allergies:  No Known Allergies:   Routine BB:  20-Sep-15 04:09   Crossmatch Unit 1 Transfused  Result(s) reported on 28 Nov 2013 at 06:56AM.  ABO Group + Rh Type A Positive  Antibody Screen NEGATIVE (Result(s) reported on 25 Nov 2013 at 05:56AM.)  General Ref:  20-Sep-15 04:09   Vitamin D 1,25 - Dihydroxy - 10 ========== TEST NAME ==========  ========= RESULTS =========  = REFERENCE RANGE =  CALCITRIOL  Calcitriol(1,25 di-OH Vit D) Calcitriol(1,25 di-OH Vit D)    [   58.8 pg/mL           ]         19.9-79.3               [facility]  No: [id number]           [address], [place], [place] [address]           Lindon Romp, MD         [phone]   Result(s) reported on 26 Nov 2013 at 05:30PM.  Routine Chem:  20-Sep-15 04:09   Glucose, Serum  341  BUN  45  Creatinine (comp)  1.52  Sodium, Serum  134  Potassium, Serum  6.2  Chloride, Serum  97  CO2, Serum 31  Calcium (Total), Serum 8.9  Anion Gap  6  Osmolality (calc) 293  eGFR (African American)  40  eGFR (Non-African American)  35 (eGFR values <2m/min/1.73 m2 may be an  indication of chronic kidney disease (CKD). Calculated eGFR is useful in patients with stable renal function. The eGFR calculation will not be reliable in acutely ill patients when serum creatinine is changing rapidly. It is not useful in  patients on dialysis. The eGFR calculation may not be applicable to patients at the low and high extremes of body sizes, pregnant women, and vegetarians.)  21-Sep-15 04:14   Glucose, Serum  196  BUN  44  Creatinine (comp)  2.14  Sodium, Serum 137  Potassium, Serum 5.0  Chloride, Serum 105  CO2, Serum 26  Calcium (Total), Serum  7.8  Anion Gap  6  Osmolality (calc) 290  eGFR (African American)  27  eGFR (Non-African American)  23 (eGFR values <677mmin/1.73 m2 may be an indication of chronic kidney disease (CKD). Calculated eGFR is useful in patients with stable renal function. The eGFR calculation will not be reliable in acutely ill patients when serum creatinine is changing rapidly. It is not useful in  patients on dialysis. The eGFR calculation may not be applicable to patients at the low and high extremes of body sizes, pregnant women, and vegetarians.)  Hemoglobin A1c ([facility])  7.5 (  The American Diabetes Association recommends that a primary goal of therapy should be <7% and that physicians should reevaluate the treatment regimen in patients with HbA1c values consistently >8%.)  22-Sep-15 05:57   Glucose, Serum  258  BUN  40  Creatinine (comp)  1.77  Sodium, Serum  135  Potassium, Serum 4.9  Chloride, Serum 105  CO2, Serum 27  Calcium (Total), Serum  7.4  Anion Gap  3  Osmolality (calc) 289  eGFR (African American)  34  eGFR (Non-African American)  29 (eGFR values <12m/min/1.73 m2 may be an indication of chronic kidney disease (CKD). Calculated eGFR is useful in patients with stable renal function. The eGFR calculation will not be reliable in acutely ill patients when serum creatinine is changing rapidly. It is not useful in   patients on dialysis. The eGFR calculation may not be applicable to patients at the low and high extremes of body sizes, pregnant women, and vegetarians.)  23-Sep-15 08:52   Glucose, Serum  309  BUN  35  Creatinine (comp)  1.42  Sodium, Serum  135  Potassium, Serum 5.0  Chloride, Serum 103  CO2, Serum 26  Calcium (Total), Serum  8.1  Anion Gap  6  Osmolality (calc) 290  eGFR (African American)  44  eGFR (Non-African American)  38 (eGFR values <617mmin/1.73 m2 may be an indication of chronic kidney disease (CKD). Calculated eGFR is useful in patients with stable renal function. The eGFR calculation will not be reliable in acutely ill patients when serum creatinine is changing rapidly. It is not useful in  patients on dialysis. The eGFR calculation may not be applicable to patients at the low and high extremes of body sizes, pregnant women, and vegetarians.)  Routine Hem:  20-Sep-15 04:09   WBC (CBC) 8.6  RBC (CBC) 3.97  Hemoglobin (CBC)  11.8  Hematocrit (CBC) 36.7  Platelet Count (CBC) 198  MCV 92  MCH 29.6  MCHC 32.1  RDW  14.7  Neutrophil % 87.2  Lymphocyte % 8.4  Monocyte % 3.9  Eosinophil % 0.1  Basophil % 0.4  Neutrophil #  7.5  Lymphocyte #  0.7  Monocyte # 0.3  Eosinophil # 0.0  Basophil # 0.0 (Result(s) reported on 25 Nov 2013 at 04:48AM.)  21-Sep-15 04:14   WBC (CBC) 6.7  RBC (CBC)  3.14  Hemoglobin (CBC)  9.3  Hematocrit (CBC)  29.0  Platelet Count (CBC) 184  MCV 92  MCH 29.6  MCHC 32.0  RDW  14.7  Neutrophil % 71.7  Lymphocyte % 17.0  Monocyte % 9.2  Eosinophil % 1.5  Basophil % 0.6  Neutrophil # 4.8  Lymphocyte # 1.1  Monocyte # 0.6  Eosinophil # 0.1  Basophil # 0.0 (Result(s) reported on 26 Nov 2013 at 06[facility]  22-Sep-15 05:57   WBC (CBC) 6.4  RBC (CBC)  2.71  Hemoglobin (CBC)  7.7  Hematocrit (CBC)  25.2  Platelet Count (CBC)  148  MCV 93  MCH 28.4  MCHC  30.6  RDW  14.7  Neutrophil % 70.2  Lymphocyte % 16.0  Monocyte %  10.4  Eosinophil % 2.9  Basophil % 0.5  Neutrophil # 4.5  Lymphocyte # 1.0  Monocyte # 0.7  Eosinophil # 0.2  Basophil # 0.0 (Result(s) reported on 27 Nov 2013 at 07:28AM.)  23-Sep-15 08:52   WBC (CBC) 8.3  RBC (CBC)  2.99  Hemoglobin (CBC)  8.8  Hematocrit (CBC)  27.8  Platelet Count (CBC) 159  MCV 93  MCH 29.3  MCHC  31.5  RDW  14.6  Neutrophil % 77.6  Lymphocyte % 12.6  Monocyte % 7.3  Eosinophil % 2.2  Basophil % 0.3  Neutrophil # 6.4  Lymphocyte # 1.1  Monocyte # 0.6  Eosinophil # 0.2  Basophil # 0.0 (Result(s) reported on 28 Nov 2013 at 09:13AM.)   PERTINENT RADIOLOGY STUDIES: XRay:    20-Sep-15 06:23, Chest 1 View AP or PA  Chest 1 View AP or PA   REASON FOR EXAM:    eval for acute process  COMMENTS:       PROCEDURE: DXR - DXR CHEST 1 VIEWAP OR PA  - Nov 25 2013  6:23AM     CLINICAL DATA:  Hip fracture.    EXAM:  PORTABLE CHEST - 1 VIEW    COMPARISON:  None.    FINDINGS:  Cardiac silhouette mildly to moderately enlarged for AP portable  technique. Consolidation in the lateral left lung base. Lungs  otherwise clear. Pulmonary venous hypertension without overt edema.  No visible pleural effusions.     IMPRESSION:  Atelectasis versus pneumonia involving the lateral left lower lobe.  Cardiomegaly without pulmonary edema.      Electronically Signed    By: Evangeline Dakin M.D.    On: 11/25/2013 08:13         Verified By: Deniece Portela, M.D.,    20-Sep-15 10:51, Hip Left One View  Hip Left One View   REASON FOR EXAM:    hip fracture with no xrays from [facility] sent  COMMENTS:   LMP: Post-Menopausal    PROCEDURE: DXR - DXR HIP LEFT ONE VIEW  - Nov 25 2013 10:51AM     CLINICAL DATA:  Known hip fracture; no images stent with patient  from that outside facility    EXAM:  LEFT HIP - 1 VIEW:    COMPARISON:  None.    FINDINGS:  This single portable frog-leg lateral view of the left hip reveal an  intertrochanteric fracture. There is  mild overriding of the fracture  fragments. The femoral head and acetabulum are grossly intact.     IMPRESSION:  There is an intertrochanteric fracture of the left hip.      Electronically Signed    By: David  Martinique    On: 11/25/2013 10:54         Verified By: DAVID A. Martinique, M.D., MD    20-Sep-15 15:03, Hip Left One View  Hip Left One View   REASON FOR EXAM:    Fracture reduction/fixation/left hip long Affixus   nailing  COMMENTS:       PROCEDURE: DXR - DXR HIP LEFT ONE VIEW  - Nov 25 2013  3:03PM     CLINICAL DATA:  Status post ORIF foreign intertrochanteric fracture  of the left hip.    EXAM:  LEFT HIP - 1 VIEW:    COMPARISON:  None.    FINDINGS:  Fluoro time reported is 2 min 49 seconds. Fluoro spot films reveal  the patient placement of an intra medullary rod and telescoping dx  screw for an intertrochanteric fracture. Alignment of the fracture  fragments is now more near anatomic.     IMPRESSION:  The patient has undergone ORIF foreign intertrochanteric fracture of  the left hip without evidence of immediate postprocedure  complication.      Electronically Signed    By: David  Martinique    On: 11/25/2013 15:08     Verified By: Shanon Brow  A. Martinique, M.D., MD  LabUnknown:    20-Sep-15 06:23, Chest 1 View AP or PA  PACS Image     20-Sep-15 10:51, Hip Left One View  PACS Image     20-Sep-15 15:03, Hip Left One View  PACS Image    Pertinent Past History:  Pertinent Past History Significant for coronary artery disease, diabetes type 2, osteoporosis, chronic back pain and Dupuytren contractures as well as coronary artery disease. The patient had a myocardial infarction approximately 3 years ago.   Hospital Course:  Hospital Course * hip fracture- sx 11/25/13   manage per sx.   PT, rehab placement today.  * ac blood loss anemia   s/p sx, monitor. Dropped to 7.7   Explained pt about risk of BT- including mild fever- to severe cross reactions and lung or kidney  damage.   She agreed to receive 1 unit- as it may help to recover.   transfused 9/22- now Hb 8.8  * CAD   asa, statin  * DM on insulin.  * osteoporosis   cal+ vit D   Condition on Discharge Stable   Code Status:  Code Status Full Code   DISCHARGE INSTRUCTIONS HOME MEDS:  Medication Reconciliation: Patient's Home Medications at Discharge:     Medication Instructions  fish oil  300 milligram(s) orally once a daytabs   gabapentin 600 mg oral tablet  1 tab(s) orally 3 times a day   vitamin d3 2000 intl units oral capsule  1 cap(s) orally once a day   aspirin low dose 81 mg oral delayed release tablet  1 tab(s) orally once a day   atorvastatin 80 mg oral tablet  1 tab(s) orally once a day (at bedtime)   durezol 0.05% ophthalmic emulsion  1 drop(s) to each affected eye 4 times a day   timolol ophthalmic 0.5% ophthalmic solution  1 drop(s) to each affected eye 2 times a day   ilevro 0.3% ophthalmic suspension  1 drop(s) to each affected eye once a day   insulin detemir 100 units/ml subcutaneous solution  15 unit(s) subcutaneous once a day (at bedtime)   lasix 40 mg oral tablet  0.5 tab(s) orally once a day   oxycodone 10 mg oral tablet, extended release  1 tab(s) orally every 12 hours   oxycodone 10 mg oral tablet  1 tab(s) orally every 4 hours, As needed, moderate pain (4-6/10)   tamsulosin 0.4 mg oral capsule  1 cap(s) orally    nystatin 100,000 units/g topical powder  1 application topically 2 times a day   ferrous sulfate 325 mg (65 mg elemental iron) oral tablet  1 tab(s) orally 2 times a day (with meals)   senna  1 tab(s) orally 2 times a day, As needed, constipation   heparin  5000 unit(s) subcutaneous every 8 hours    STOP TAKING THE FOLLOWING MEDICATION(S):    oxybutynin 10 mg/24 hr oral tablet, extended release: 1 tab(s) orally once a day norco 325 mg-10 mg oral tablet: 1 tab(s) orally 1 to 2 times a day prn enalapril 2.5 mg oral tablet: 1 tab(s) orally once a  day  Physician's Instructions:  Diet Low Sodium  Low Fat, Low Cholesterol  Carbohydrate Controlled (ADA) Diet   Activity Limitations As tolerated   Return to Work Not Applicable   Time frame for Follow Up Appointment 1-2 weeks  PMD, Ortho   Other Comments follow with PMD in 2 weeks and check renal function.  Hessie Knows J(Ordered): [facility], [address] [place], [place] [address], [place]  Electronic Signatures: Vaughan Basta (MD)  (Signed 23-Sep-15 17:04)  Authored: ADMISSION DATE AND DIAGNOSIS, CHIEF COMPLAINT/HPI, Allergies, PERTINENT LABS, PERTINENT RADIOLOGY STUDIES, PERTINENT PAST HISTORY, HOSPITAL COURSE, DISCHARGE INSTRUCTIONS HOME MEDS, PATIENT INSTRUCTIONS, Follow Up Physician   Last Updated: 23-Sep-15 17:04 by Vaughan Basta (MD)

## 2014-06-29 NOTE — Consult Note (Signed)
Brief Consult Note: Diagnosis: left hip fracture.   Patient was seen by consultant.   Recommend to proceed with surgery or procedure.   Recommend further assessment or treatment.   Orders entered.   Discussed with Attending MD.   Comments: Will need xray, don't have any from [facility] , but expect long cephalomedullary nail. has eleveted potassium, will recheck stat.  Electronic Signatures: Laurene Footman (MD)  (Signed 20-Sep-15 09:06)  Authored: Brief Consult Note   Last Updated: 20-Sep-15 09:06 by Laurene Footman (MD)

## 2014-06-29 NOTE — Op Note (Signed)
PATIENT NAME:  Andrea [name], Andrea [name] MR#:  [id number] DATE OF BIRTH:  June 30, 1945  DATE OF PROCEDURE:  11/05/2013  PREOPERATIVE DIAGNOSIS: Nuclear sclerotic cataract, right eye.   POSTOPERATIVE DIAGNOSIS: Nuclear sclerotic cataract, right eye.  PROCEDURE PERFORMED: Extracapsular cataract extraction using phacoemulsification with placement of Alcon [id number], 22.5 diopter posterior chamber lens, serial number [id number].  SURGEON: Loura Back. Noriel Guthrie, M.D.   ANESTHESIA: Lidocaine 4% and 0.75% Marcaine, a 50-50 mixture with 10 units per mL of Hylenex added, given as a peribulbar.  ANESTHESIOLOGIST: Velora Heckler. Polin, MD  COMPLICATIONS: None.   ESTIMATED BLOOD LOSS: Less than 1 mL.  DESCRIPTION OF PROCEDURE:  The patient was brought to the operating room and given a peribulbar block.  The patient was then prepped and draped in the usual fashion.  The vertical rectus muscles were imbricated using 5-0 silk sutures.  These sutures were then clamped to the sterile drapes as bridle sutures.  A limbal peritomy was performed extending two clock hours and hemostasis was obtained with cautery.  A partial thickness scleral groove was made at the surgical limbus and dissected anteriorly in a lamellar dissection using an Alcon crescent knife.  The anterior chamber was entered superonasally with a Superblade and through the lamellar dissection with a 2.6 mm keratome.  DisCoVisc was used to replace the aqueous and a continuous tear capsulorrhexis was carried out.  Hydrodissection and hydrodelineation were carried out with balanced salt and a 27 gauge canula.  The nucleus was rotated to confirm the effectiveness of the hydrodissection.  Phacoemulsification was carried out using a divide-and-conquer technique.  Total ultrasound time was 1 minute and 38.4 seconds with an average power of 25.1 percent. CDE of 40.52. No suture was placed.   Irrigation/aspiration was used to remove the residual cortex.  DisCoVisc was used  to inflate the capsule and the internal incision was enlarged to 3 mm with the crescent knife.  The intraocular lens was folded and inserted into the capsular bag using the [employer] delivery system instead of a [employer].  Irrigation/aspiration was used to remove the residual DisCoVisc.  Miostat was injected into the anterior chamber through the paracentesis track to inflate the anterior chamber and induce miosis.  Cefuroxime 0.01 mL was injected via the paracentesis tract containing 1 mg of drug. The wound was checked for leaks and none were found. The conjunctiva was closed with cautery and the bridle sutures were removed.  Two drops of 0.3% Vigamox were placed on the eye.   An eye shield was placed on the eye.  The patient was discharged to the recovery room in good condition.   ____________________________ Loura Back Taje Littler, MD sad:LT D: 11/05/2013 13:26:19 ET T: 11/05/2013 22:47:54 ET JOB#: [id number]  cc: Remo Lipps A. Heman Que, MD, <Dictator> Martie Lee MD ELECTRONICALLY SIGNED 11/13/2013 11:24

## 2014-08-27 DIAGNOSIS — S7290XA Unspecified fracture of unspecified femur, initial encounter for closed fracture: Secondary | ICD-10-CM | POA: Diagnosis not present

## 2014-09-10 DIAGNOSIS — M79644 Pain in right finger(s): Secondary | ICD-10-CM | POA: Diagnosis not present

## 2014-09-10 DIAGNOSIS — Z6839 Body mass index (BMI) 39.0-39.9, adult: Secondary | ICD-10-CM | POA: Diagnosis not present

## 2014-09-10 DIAGNOSIS — M659 Synovitis and tenosynovitis, unspecified: Secondary | ICD-10-CM | POA: Diagnosis not present

## 2014-09-26 DIAGNOSIS — S7290XA Unspecified fracture of unspecified femur, initial encounter for closed fracture: Secondary | ICD-10-CM | POA: Diagnosis not present

## 2014-10-07 DIAGNOSIS — Z79899 Other long term (current) drug therapy: Secondary | ICD-10-CM | POA: Diagnosis not present

## 2014-10-07 DIAGNOSIS — E1165 Type 2 diabetes mellitus with hyperglycemia: Secondary | ICD-10-CM | POA: Diagnosis not present

## 2014-10-07 DIAGNOSIS — M545 Low back pain: Secondary | ICD-10-CM | POA: Diagnosis not present

## 2014-10-07 DIAGNOSIS — E780 Pure hypercholesterolemia: Secondary | ICD-10-CM | POA: Diagnosis not present

## 2014-10-07 DIAGNOSIS — I2510 Atherosclerotic heart disease of native coronary artery without angina pectoris: Secondary | ICD-10-CM | POA: Diagnosis not present

## 2014-10-07 DIAGNOSIS — E559 Vitamin D deficiency, unspecified: Secondary | ICD-10-CM | POA: Diagnosis not present

## 2014-10-15 DIAGNOSIS — I2510 Atherosclerotic heart disease of native coronary artery without angina pectoris: Secondary | ICD-10-CM | POA: Diagnosis not present

## 2014-10-15 DIAGNOSIS — I5021 Acute systolic (congestive) heart failure: Secondary | ICD-10-CM | POA: Diagnosis not present

## 2014-10-15 DIAGNOSIS — E669 Obesity, unspecified: Secondary | ICD-10-CM | POA: Diagnosis not present

## 2014-10-15 DIAGNOSIS — E785 Hyperlipidemia, unspecified: Secondary | ICD-10-CM | POA: Diagnosis not present

## 2014-10-27 DIAGNOSIS — S7290XA Unspecified fracture of unspecified femur, initial encounter for closed fracture: Secondary | ICD-10-CM | POA: Diagnosis not present

## 2014-10-29 DIAGNOSIS — I5021 Acute systolic (congestive) heart failure: Secondary | ICD-10-CM | POA: Diagnosis not present

## 2014-11-05 DIAGNOSIS — Z6838 Body mass index (BMI) 38.0-38.9, adult: Secondary | ICD-10-CM | POA: Diagnosis not present

## 2014-11-05 DIAGNOSIS — N390 Urinary tract infection, site not specified: Secondary | ICD-10-CM | POA: Diagnosis not present

## 2014-11-05 DIAGNOSIS — N3941 Urge incontinence: Secondary | ICD-10-CM | POA: Diagnosis not present

## 2014-11-06 ENCOUNTER — Emergency Department
Admission: EM | Admit: 2014-11-06 | Discharge: 2014-11-06 | Disposition: A | Discharge status: Home / Self Care | Payer: Medicare Other | Attending: Emergency Medicine | Admitting: Emergency Medicine

## 2014-11-06 DIAGNOSIS — R509 Fever, unspecified: Secondary | ICD-10-CM | POA: Diagnosis not present

## 2014-11-06 DIAGNOSIS — E119 Type 2 diabetes mellitus without complications: Secondary | ICD-10-CM | POA: Insufficient documentation

## 2014-11-06 DIAGNOSIS — N390 Urinary tract infection, site not specified: Secondary | ICD-10-CM | POA: Diagnosis not present

## 2014-11-06 DIAGNOSIS — R110 Nausea: Secondary | ICD-10-CM | POA: Diagnosis not present

## 2014-11-06 HISTORY — DX: Atherosclerotic heart disease of native coronary artery without angina pectoris: I25.10

## 2014-11-06 HISTORY — DX: Transient cerebral ischemic attack, unspecified: G45.9

## 2014-11-06 HISTORY — DX: Acute myocardial infarction, unspecified: I21.9

## 2014-11-06 HISTORY — DX: Type 2 diabetes mellitus without complications: E11.9

## 2014-11-06 LAB — COMPREHENSIVE METABOLIC PANEL
ALT: 43 U/L (ref 14–54)
AST: 49 U/L — AB (ref 15–41)
Albumin: 3.2 g/dL — ABNORMAL LOW (ref 3.5–5.0)
Alkaline Phosphatase: 208 U/L — ABNORMAL HIGH (ref 38–126)
Anion gap: 10 (ref 5–15)
BILIRUBIN TOTAL: 2.7 mg/dL — AB (ref 0.3–1.2)
BUN: 25 mg/dL — AB (ref 6–20)
CO2: 25 mmol/L (ref 22–32)
CREATININE: 1.54 mg/dL — AB (ref 0.44–1.00)
Calcium: 8.5 mg/dL — ABNORMAL LOW (ref 8.9–10.3)
Chloride: 97 mmol/L — ABNORMAL LOW (ref 101–111)
GFR calc Af Amer: 39 mL/min — ABNORMAL LOW (ref >60)
GFR, EST NON AFRICAN AMERICAN: 33 mL/min — AB (ref >60)
Glucose, Bld: 277 mg/dL — ABNORMAL HIGH (ref 65–99)
POTASSIUM: 4.3 mmol/L (ref 3.5–5.1)
Sodium: 132 mmol/L — ABNORMAL LOW (ref 135–145)
TOTAL PROTEIN: 7.1 g/dL (ref 6.5–8.1)

## 2014-11-06 LAB — CBC
HCT: 39.1 % (ref 35.0–47.0)
Hemoglobin: 12.7 g/dL (ref 12.0–16.0)
MCH: 29.3 pg (ref 26.0–34.0)
MCHC: 32.5 g/dL (ref 32.0–36.0)
MCV: 90.1 fL (ref 80.0–100.0)
PLATELETS: 128 10*3/uL — AB (ref 150–440)
RBC: 4.34 MIL/uL (ref 3.80–5.20)
RDW: 14.1 % (ref 11.5–14.5)
WBC: 9.3 10*3/uL (ref 3.6–11.0)

## 2014-11-06 LAB — URINALYSIS COMPLETE WITH MICROSCOPIC (ARMC ONLY)
Bilirubin Urine: NEGATIVE
KETONES UR: NEGATIVE mg/dL
NITRITE: NEGATIVE
Protein, ur: 100 mg/dL — AB
SPECIFIC GRAVITY, URINE: 1.011 (ref 1.005–1.030)
pH: 6 (ref 5.0–8.0)

## 2014-11-06 LAB — LIPASE, BLOOD: Lipase: 13 U/L — ABNORMAL LOW (ref 22–51)

## 2014-11-06 MED ORDER — DEXTROSE 5 % IV SOLN
1.0000 g | Freq: Once | INTRAVENOUS | Status: AC
  Administered 2014-11-06: 1 g via INTRAVENOUS
  Filled 2014-11-06: qty 10

## 2014-11-06 MED ORDER — ACETAMINOPHEN 500 MG PO TABS
1000.0000 mg | ORAL_TABLET | Freq: Once | ORAL | Status: AC
  Administered 2014-11-06: 1000 mg via ORAL
  Filled 2014-11-06: qty 2

## 2014-11-06 MED ORDER — ONDANSETRON HCL 4 MG/2ML IJ SOLN
4.0000 mg | Freq: Once | INTRAMUSCULAR | Status: AC
  Administered 2014-11-06: 4 mg via INTRAVENOUS
  Filled 2014-11-06: qty 2

## 2014-11-06 MED ORDER — SODIUM CHLORIDE 0.9 % IV BOLUS (SEPSIS)
1000.0000 mL | Freq: Once | INTRAVENOUS | Status: AC
  Administered 2014-11-06: 1000 mL via INTRAVENOUS

## 2014-11-06 MED ORDER — ONDANSETRON 4 MG PO TBDP: 4.0000 mg | ORAL_TABLET | Freq: Three times a day (TID) | ORAL | Status: DC | PRN

## 2014-11-06 MED ORDER — CEPHALEXIN 500 MG PO CAPS: 500.0000 mg | ORAL_CAPSULE | Freq: Four times a day (QID) | ORAL | Status: AC

## 2014-11-06 NOTE — ED Provider Notes (Signed)
[facility] Emergency Department Provider Note  Time seen: 9:12 PM  I have reviewed the triage vital signs and the nursing notes.   HISTORY  Chief Complaint Nausea    HPI Andrea [name] is a 69 y.o. female for the past medical history of diabetes, MI, TIAs who presents the emergency department for nausea. According to the patient she was having lower abdominal pain, and dysuria so she went to her primary care doctor yesterday and was diagnosed with urinary tract infection. Patient was prescribed Bactrim when she started this morning, and since starting her antibiotics she has been very nauseated unable to tolerate anything orally. Patient also states since last night she had a fever 102 yesterday, 103.3 in the ER today. Denies any back pain, denies any vomiting but states she feels too nauseated to eat. Describes her lower abdominal pain as dull, burning, moderate in severity. Worse when urinating.     Past Medical History  Diagnosis Date  . Diabetes mellitus without complication   . Coronary artery disease   . MI (myocardial infarction)   . TIA (transient ischemic attack)     There are no active problems to display for this patient.   History reviewed. No pertinent past surgical history.  No current outpatient prescriptions on file.  Allergies Review of patient's allergies indicates no known allergies.  No family history on file.  Social History Social History  Substance Use Topics  . Smoking status: None  . Smokeless tobacco: None  . Alcohol Use: None    Review of Systems Constitutional: Positive for fever. Cardiovascular: Negative for chest pain. Respiratory: Negative for shortness of breath. Gastrointestinal: Positive for lower abdominal pain, nausea. Negative for diarrhea and vomiting. Genitourinary: Positive for dysuria. Musculoskeletal: Negative for back pain Neurological: Negative for headache 10-point ROS otherwise  negative.  ____________________________________________   PHYSICAL EXAM:  VITAL SIGNS: ED Triage Vitals  Enc Vitals Group     BP 11/06/14 2051 170/64 mmHg     Pulse Rate 11/06/14 2051 98     Resp 11/06/14 2051 12     Temp 11/06/14 2051 103.3 F (39.6 C)     Temp Source 11/06/14 2051 Oral     SpO2 11/06/14 2051 91 %     Weight 11/06/14 2051 197 lb (89.359 kg)     Height 11/06/14 2051 5' (1.524 m)     Head Cir --      Peak Flow --      Pain Score 11/06/14 2053 6     Pain Loc --      Pain Edu? --      Excl. in [place]? --     Constitutional: Alert and oriented. Well appearing and in no distress. Eyes: Normal exam ENT   Mouth/Throat: Mucous membranes are moist. Cardiovascular: Normal rate, regular rhythm.  Respiratory: Normal respiratory effort without tachypnea nor retractions. Breath sounds are clear and equal bilaterally. No wheezes/rales/rhonchi. Gastrointestinal: Soft, moderate lower abdominal/suprapubic tenderness palpation. No distention. No CVA tenderness palpation. Musculoskeletal: Nontender with normal range of motion in all extremities. Neurologic:  Normal speech and language. No gross focal neurologic deficits Skin:  Skin is warm, dry and intact.  Psychiatric: Mood and affect are normal. Speech and behavior are normal. ____________________________________________   INITIAL IMPRESSION / ASSESSMENT AND PLAN / ED COURSE  Pertinent labs & imaging results that were available during my care of the patient were reviewed by me and considered in my medical decision making (see chart for details).  Patient with lower abdominal pain, dysuria, now fever to 103, nausea. We will check labs including blood cultures, urinalysis and urine culture. Patient states her nausea began after taking Bactrim, possible reaction to antibiotics versus progression of her urinary tract infection with possible pyelonephritis. We will start IV Rocephin empirically while awaiting lab results,  continue IV hydration and monitor very closely in the emergency department.  Labs consistent with urinary tract infection, but kidney function appears within normal limits, normal white blood cell count. We will treat with Rocephin in the emergency department and discharged on Keflex. I discontinued the patient's Bactrim, and I will prescribe a nausea medication as needed. I discussed this plan of care with the patient is agreeable. Patient is to follow-up with her primary care doctor tomorrow. ____________________________________________   FINAL CLINICAL IMPRESSION(S) / ED DIAGNOSES  Fever Urinary tract infection   Harvest Dark, MD 11/06/14 2227

## 2014-11-06 NOTE — Discharge Instructions (Signed)
You have been seen in the emergency department today for nausea, vomiting, any urinary tract infection. Please take your antibiotics as prescribed for the entire course. Please take nausea medication as needed, as written. Please follow-up with your primary care doctor in the next 1-2 days for reevaluation. As we discussed if you develop any abdominal pain, continued fever, or vomiting unable to keep your antibiotics down please return to the emergency department immediately for further evaluation.     Nausea and Vomiting Nausea is a sick feeling that often comes before throwing up (vomiting). Vomiting is a reflex where stomach contents come out of your mouth. Vomiting can cause severe loss of body fluids (dehydration). Children and elderly adults can become dehydrated quickly, especially if they also have diarrhea. Nausea and vomiting are symptoms of a condition or disease. It is important to find the cause of your symptoms. CAUSES   Direct irritation of the stomach lining. This irritation can result from increased acid production (gastroesophageal reflux disease), infection, food poisoning, taking certain medicines (such as nonsteroidal anti-inflammatory drugs), alcohol use, or tobacco use.  Signals from the brain.These signals could be caused by a headache, heat exposure, an inner ear disturbance, increased pressure in the brain from injury, infection, a tumor, or a concussion, pain, emotional stimulus, or metabolic problems.  An obstruction in the gastrointestinal tract (bowel obstruction).  Illnesses such as diabetes, hepatitis, gallbladder problems, appendicitis, kidney problems, cancer, sepsis, atypical symptoms of a heart attack, or eating disorders.  Medical treatments such as chemotherapy and radiation.  Receiving medicine that makes you sleep (general anesthetic) during surgery. DIAGNOSIS Your caregiver may ask for tests to be done if the problems do not improve after a few days.  Tests may also be done if symptoms are severe or if the reason for the nausea and vomiting is not clear. Tests may include:  Urine tests.  Blood tests.  Stool tests.  Cultures (to look for evidence of infection).  X-rays or other imaging studies. Test results can help your caregiver make decisions about treatment or the need for additional tests. TREATMENT You need to stay well hydrated. Drink frequently but in small amounts.You may wish to drink water, sports drinks, clear broth, or eat frozen ice pops or gelatin dessert to help stay hydrated.When you eat, eating slowly may help prevent nausea.There are also some antinausea medicines that may help prevent nausea. HOME CARE INSTRUCTIONS   Take all medicine as directed by your caregiver.  If you do not have an appetite, do not force yourself to eat. However, you must continue to drink fluids.  If you have an appetite, eat a normal diet unless your caregiver tells you differently.  Eat a variety of complex carbohydrates (rice, wheat, potatoes, bread), lean meats, yogurt, fruits, and vegetables.  Avoid high-fat foods because they are more difficult to digest.  Drink enough water and fluids to keep your urine clear or pale yellow.  If you are dehydrated, ask your caregiver for specific rehydration instructions. Signs of dehydration may include:  Severe thirst.  Dry lips and mouth.  Dizziness.  Dark urine.  Decreasing urine frequency and amount.  Confusion.  Rapid breathing or pulse. SEEK IMMEDIATE MEDICAL CARE IF:   You have blood or brown flecks (like coffee grounds) in your vomit.  You have black or bloody stools.  You have a severe headache or stiff neck.  You are confused.  You have severe abdominal pain.  You have chest pain or trouble breathing.  You  do not urinate at least once every 8 hours.  You develop cold or clammy skin.  You continue to vomit for longer than 24 to 48 hours.  You have a  fever. MAKE SURE YOU:   Understand these instructions.  Will watch your condition.  Will get help right away if you are not doing well or get worse. Document Released: 02/22/2005 Document Revised: 05/17/2011 Document Reviewed: 07/22/2010 [facility] Patient Information 2015 [place], [place]. This information is not intended to replace advice given to you by your health care provider. Make sure you discuss any questions you have with your health care provider.  Urinary Tract Infection Urinary tract infections (UTIs) can develop anywhere along your urinary tract. Your urinary tract is your body's drainage system for removing wastes and extra water. Your urinary tract includes two kidneys, two ureters, a bladder, and a urethra. Your kidneys are a pair of bean-shaped organs. Each kidney is about the size of your fist. They are located below your ribs, one on each side of your spine. CAUSES Infections are caused by microbes, which are microscopic organisms, including fungi, viruses, and bacteria. These organisms are so small that they can only be seen through a microscope. Bacteria are the microbes that most commonly cause UTIs. SYMPTOMS  Symptoms of UTIs may vary by age and gender of the patient and by the location of the infection. Symptoms in young women typically include a frequent and intense urge to urinate and a painful, burning feeling in the bladder or urethra during urination. Older women and men are more likely to be tired, shaky, and weak and have muscle aches and abdominal pain. A fever may mean the infection is in your kidneys. Other symptoms of a kidney infection include pain in your back or sides below the ribs, nausea, and vomiting. DIAGNOSIS To diagnose a UTI, your caregiver will ask you about your symptoms. Your caregiver also will ask to provide a urine sample. The urine sample will be tested for bacteria and white blood cells. White blood cells are made by your body to help fight  infection. TREATMENT  Typically, UTIs can be treated with medication. Because most UTIs are caused by a bacterial infection, they usually can be treated with the use of antibiotics. The choice of antibiotic and length of treatment depend on your symptoms and the type of bacteria causing your infection. HOME CARE INSTRUCTIONS  If you were prescribed antibiotics, take them exactly as your caregiver instructs you. Finish the medication even if you feel better after you have only taken some of the medication.  Drink enough water and fluids to keep your urine clear or pale yellow.  Avoid caffeine, tea, and carbonated beverages. They tend to irritate your bladder.  Empty your bladder often. Avoid holding urine for long periods of time.  Empty your bladder before and after sexual intercourse.  After a bowel movement, women should cleanse from front to back. Use each tissue only once. SEEK MEDICAL CARE IF:   You have back pain.  You develop a fever.  Your symptoms do not begin to resolve within 3 days. SEEK IMMEDIATE MEDICAL CARE IF:   You have severe back pain or lower abdominal pain.  You develop chills.  You have nausea or vomiting.  You have continued burning or discomfort with urination. MAKE SURE YOU:   Understand these instructions.  Will watch your condition.  Will get help right away if you are not doing well or get worse. Document Released: 12/02/2004 Document  Revised: 08/24/2011 Document Reviewed: 04/02/2011 [facility] Patient Information 2015 [place], [place]. This information is not intended to replace advice given to you by your health care provider. Make sure you discuss any questions you have with your health care provider.

## 2014-11-06 NOTE — ED Notes (Signed)
Pt presents to ED with c/o nausea and dry heaves since starting an ABX (Bactrim) for a UTI today (pt dx/'d with a UTI yesterday at her PCP and was prescribed an ABX). Pt reports not eating anything at all today  D/t lack of appetite. Per EMS, pt is also reported to have a fever of 102.8 PTA. Pt is A&O, in NAD, respirations even and unlabored, calm and cooperative. EMS reports CBG of 218, BP of 148/62, HR 102.

## 2014-11-06 NOTE — ED Notes (Signed)
Pt placed on 2L O2 via [place] d/t O2 sats between 90-92% after ambulating to the toilet.

## 2014-11-07 DIAGNOSIS — H40003 Preglaucoma, unspecified, bilateral: Secondary | ICD-10-CM | POA: Diagnosis not present

## 2014-11-08 LAB — URINE CULTURE

## 2014-11-11 LAB — CULTURE, BLOOD (ROUTINE X 2)
CULTURE: NO GROWTH
Culture: NO GROWTH

## 2014-11-13 DIAGNOSIS — Z961 Presence of intraocular lens: Secondary | ICD-10-CM | POA: Diagnosis not present

## 2014-11-28 DIAGNOSIS — N390 Urinary tract infection, site not specified: Secondary | ICD-10-CM | POA: Diagnosis not present

## 2014-11-28 DIAGNOSIS — Z9181 History of falling: Secondary | ICD-10-CM | POA: Diagnosis not present

## 2014-11-28 DIAGNOSIS — Z1389 Encounter for screening for other disorder: Secondary | ICD-10-CM | POA: Diagnosis not present

## 2014-11-28 DIAGNOSIS — Z6838 Body mass index (BMI) 38.0-38.9, adult: Secondary | ICD-10-CM | POA: Diagnosis not present

## 2014-11-28 DIAGNOSIS — Z139 Encounter for screening, unspecified: Secondary | ICD-10-CM | POA: Diagnosis not present

## 2014-11-28 DIAGNOSIS — N898 Other specified noninflammatory disorders of vagina: Secondary | ICD-10-CM | POA: Diagnosis not present

## 2015-01-09 DIAGNOSIS — I2510 Atherosclerotic heart disease of native coronary artery without angina pectoris: Secondary | ICD-10-CM | POA: Diagnosis not present

## 2015-01-09 DIAGNOSIS — Z79899 Other long term (current) drug therapy: Secondary | ICD-10-CM | POA: Diagnosis not present

## 2015-01-09 DIAGNOSIS — Z23 Encounter for immunization: Secondary | ICD-10-CM | POA: Diagnosis not present

## 2015-01-09 DIAGNOSIS — D509 Iron deficiency anemia, unspecified: Secondary | ICD-10-CM | POA: Diagnosis not present

## 2015-01-09 DIAGNOSIS — E7800 Pure hypercholesterolemia, unspecified: Secondary | ICD-10-CM | POA: Diagnosis not present

## 2015-01-09 DIAGNOSIS — M545 Low back pain: Secondary | ICD-10-CM | POA: Diagnosis not present

## 2015-01-09 DIAGNOSIS — E1165 Type 2 diabetes mellitus with hyperglycemia: Secondary | ICD-10-CM | POA: Diagnosis not present

## 2015-02-25 DIAGNOSIS — E669 Obesity, unspecified: Secondary | ICD-10-CM | POA: Diagnosis not present

## 2015-02-25 DIAGNOSIS — N390 Urinary tract infection, site not specified: Secondary | ICD-10-CM | POA: Diagnosis not present

## 2015-02-25 DIAGNOSIS — Z6838 Body mass index (BMI) 38.0-38.9, adult: Secondary | ICD-10-CM | POA: Diagnosis not present

## 2015-03-11 DIAGNOSIS — L270 Generalized skin eruption due to drugs and medicaments taken internally: Secondary | ICD-10-CM | POA: Diagnosis not present

## 2015-04-16 DIAGNOSIS — I2510 Atherosclerotic heart disease of native coronary artery without angina pectoris: Secondary | ICD-10-CM | POA: Diagnosis not present

## 2015-04-16 DIAGNOSIS — E785 Hyperlipidemia, unspecified: Secondary | ICD-10-CM | POA: Diagnosis not present

## 2015-04-16 DIAGNOSIS — I5022 Chronic systolic (congestive) heart failure: Secondary | ICD-10-CM | POA: Diagnosis not present

## 2015-04-16 DIAGNOSIS — E669 Obesity, unspecified: Secondary | ICD-10-CM | POA: Insufficient documentation

## 2015-04-22 DIAGNOSIS — I2510 Atherosclerotic heart disease of native coronary artery without angina pectoris: Secondary | ICD-10-CM | POA: Diagnosis not present

## 2015-04-22 DIAGNOSIS — E1165 Type 2 diabetes mellitus with hyperglycemia: Secondary | ICD-10-CM | POA: Diagnosis not present

## 2015-04-22 DIAGNOSIS — K219 Gastro-esophageal reflux disease without esophagitis: Secondary | ICD-10-CM | POA: Diagnosis not present

## 2015-05-14 DIAGNOSIS — H40003 Preglaucoma, unspecified, bilateral: Secondary | ICD-10-CM | POA: Diagnosis not present

## 2015-07-08 ENCOUNTER — Emergency Department: Payer: Medicare Other

## 2015-07-08 ENCOUNTER — Emergency Department
Admission: EM | Admit: 2015-07-08 | Discharge: 2015-07-08 | Disposition: A | Discharge status: Home / Self Care | Payer: Medicare Other | Attending: Emergency Medicine | Admitting: Emergency Medicine

## 2015-07-08 ENCOUNTER — Encounter: Payer: Self-pay | Admitting: Emergency Medicine

## 2015-07-08 DIAGNOSIS — E119 Type 2 diabetes mellitus without complications: Secondary | ICD-10-CM | POA: Insufficient documentation

## 2015-07-08 DIAGNOSIS — R0981 Nasal congestion: Secondary | ICD-10-CM | POA: Diagnosis present

## 2015-07-08 DIAGNOSIS — J209 Acute bronchitis, unspecified: Secondary | ICD-10-CM

## 2015-07-08 DIAGNOSIS — Z79899 Other long term (current) drug therapy: Secondary | ICD-10-CM | POA: Insufficient documentation

## 2015-07-08 DIAGNOSIS — J0110 Acute frontal sinusitis, unspecified: Secondary | ICD-10-CM | POA: Diagnosis not present

## 2015-07-08 DIAGNOSIS — Z87891 Personal history of nicotine dependence: Secondary | ICD-10-CM | POA: Insufficient documentation

## 2015-07-08 DIAGNOSIS — Z7982 Long term (current) use of aspirin: Secondary | ICD-10-CM | POA: Insufficient documentation

## 2015-07-08 DIAGNOSIS — I252 Old myocardial infarction: Secondary | ICD-10-CM | POA: Insufficient documentation

## 2015-07-08 DIAGNOSIS — R05 Cough: Secondary | ICD-10-CM | POA: Diagnosis not present

## 2015-07-08 DIAGNOSIS — Z8673 Personal history of transient ischemic attack (TIA), and cerebral infarction without residual deficits: Secondary | ICD-10-CM | POA: Diagnosis not present

## 2015-07-08 DIAGNOSIS — I2510 Atherosclerotic heart disease of native coronary artery without angina pectoris: Secondary | ICD-10-CM | POA: Diagnosis not present

## 2015-07-08 MED ORDER — ALBUTEROL SULFATE HFA 108 (90 BASE) MCG/ACT IN AERS: 2.0000 | INHALATION_SPRAY | Freq: Four times a day (QID) | RESPIRATORY_TRACT | Status: DC | PRN

## 2015-07-08 MED ORDER — IPRATROPIUM-ALBUTEROL 0.5-2.5 (3) MG/3ML IN SOLN
3.0000 mL | Freq: Once | RESPIRATORY_TRACT | Status: AC
  Administered 2015-07-08: 3 mL via RESPIRATORY_TRACT
  Filled 2015-07-08: qty 3

## 2015-07-08 MED ORDER — LEVOFLOXACIN 500 MG PO TABS: 500.0000 mg | ORAL_TABLET | Freq: Every day | ORAL | Status: AC

## 2015-07-08 MED ORDER — BENZONATATE 100 MG PO CAPS
100.0000 mg | ORAL_CAPSULE | Freq: Three times a day (TID) | ORAL | Status: DC | PRN
Start: 2015-07-08 — End: 2015-09-18

## 2015-07-08 NOTE — ED Notes (Signed)
States she developed some sinus pressure and nasal congestion/drip since Sunday  Also had some diarrhea with decreased appetite

## 2015-07-08 NOTE — Discharge Instructions (Signed)
Acute Bronchitis Bronchitis is when the airways that extend from the windpipe into the lungs get red, puffy, and painful (inflamed). Bronchitis often causes thick spit (mucus) to develop. This leads to a cough. A cough is the most common symptom of bronchitis. In acute bronchitis, the condition usually begins suddenly and goes away over time (usually in 2 weeks). Smoking, allergies, and asthma can make bronchitis worse. Repeated episodes of bronchitis may cause more lung problems. HOME CARE  Rest.  Drink enough fluids to keep your pee (urine) clear or pale yellow (unless you need to limit fluids as told by your doctor).  Only take over-the-counter or prescription medicines as told by your doctor.  Avoid smoking and secondhand smoke. These can make bronchitis worse. If you are a smoker, think about using nicotine gum or skin patches. Quitting smoking will help your lungs heal faster.  Reduce the chance of getting bronchitis again by:  Washing your hands often.  Avoiding people with cold symptoms.  Trying not to touch your hands to your mouth, nose, or eyes.  Follow up with your doctor as told. GET HELP IF: Your symptoms do not improve after 1 week of treatment. Symptoms include:  Cough.  Fever.  Coughing up thick spit.  Body aches.  Chest congestion.  Chills.  Shortness of breath.  Sore throat. GET HELP RIGHT AWAY IF:   You have an increased fever.  You have chills.  You have severe shortness of breath.  You have bloody thick spit (sputum).  You throw up (vomit) often.  You lose too much body fluid (dehydration).  You have a severe headache.  You faint. MAKE SURE YOU:   Understand these instructions.  Will watch your condition.  Will get help right away if you are not doing well or get worse.   This information is not intended to replace advice given to you by your health care provider. Make sure you discuss any questions you have with your health care  provider.   Document Released: 08/11/2007 Document Revised: 10/25/2012 Document Reviewed: 08/15/2012 Elsevier Interactive Patient Education 2016 [employer]  Sinusitis, Adult Sinusitis is redness, soreness, and puffiness (inflammation) of the air pockets in the bones of your face (sinuses). The redness, soreness, and puffiness can cause air and mucus to get trapped in your sinuses. This can allow germs to grow and cause an infection.  HOME CARE   Drink enough fluids to keep your pee (urine) clear or pale yellow.  Use a humidifier in your home.  Run a hot shower to create steam in the bathroom. Sit in the bathroom with the door closed. Breathe in the steam 3-4 times a day.  Put a warm, moist washcloth on your face 3-4 times a day, or as told by your doctor.  Use salt water sprays (saline sprays) to wet the thick fluid in your nose. This can help the sinuses drain.  Only take medicine as told by your doctor. GET HELP RIGHT AWAY IF:   Your pain gets worse.  You have very bad headaches.  You are sick to your stomach (nauseous).  You throw up (vomit).  You are very sleepy (drowsy) all the time.  Your face is puffy (swollen).  Your vision changes.  You have a stiff neck.  You have trouble breathing. MAKE SURE YOU:   Understand these instructions.  Will watch your condition.  Will get help right away if you are not doing well or get worse.   This information is  not intended to replace advice given to you by your health care provider. Make sure you discuss any questions you have with your health care provider.   Document Released: 08/11/2007 Document Revised: 03/15/2014 Document Reviewed: 09/28/2011 Elsevier Interactive Patient Education 2016 [place] with your regular doctor if any continued problems. Use albuterol inhaler as needed for wheezing. Levaquin is for infection once a day for 7 days. And Tessalon Perles for cough as needed.

## 2015-07-08 NOTE — ED Notes (Signed)
Pt states headache and sinus "drip." Pt has gauze stuffed in her nose, and states it has been running since Sunday. Decreased appetite as well.

## 2015-07-08 NOTE — ED Provider Notes (Signed)
[facility] Emergency Department Provider Note   ____________________________________________  Time seen: Approximately 6:44 PM  I have reviewed the triage vital signs and the nursing notes.   HISTORY  Chief Complaint Nasal Congestion and Headache   HPI Andrea [name] is a 70 y.o. female is here complaining of sinus pressure and nasal congestion for 2-3 days. Patient states that she has had decreased appetite due to the sinus drainage. She states that her nose has been running and at same time feels stopped up. She has not been taking any over-the-counter medication for this. She also has had some diarrhea but no nausea or vomiting. She is unaware of any fever. She states she has had a history of bronchitis and pneumonia in the past. Currently she also complains of facial pain and headache. Patient was a former smoker and discontinued smoking approximately 3 years ago.At present she rates her pain as 4/10.   Past Medical History  Diagnosis Date  . Diabetes mellitus without complication ([place])   . Coronary artery disease   . MI (myocardial infarction) ([place])   . TIA (transient ischemic attack)     There are no active problems to display for this patient.   History reviewed. No pertinent past surgical history.  Current Outpatient Rx  Name  Route  Sig  Dispense  Refill  . aspirin 81 MG tablet   Oral   Take 81 mg by mouth daily.         [name] Kitchen albuterol (PROVENTIL HFA;VENTOLIN HFA) 108 (90 Base) MCG/ACT inhaler   Inhalation   Inhale 2 puffs into the lungs every 6 (six) hours as needed for wheezing or shortness of breath.   1 Inhaler   2   . atorvastatin (LIPITOR) 80 MG tablet   Oral   Take 80 mg by mouth daily.         . benzonatate (TESSALON PERLES) 100 MG capsule   Oral   Take 1 capsule (100 mg total) by mouth 3 (three) times daily as needed for cough.   30 capsule   0   . Cyanocobalamin (B-12) 1000 MCG CAPS   Oral   Take 1,500 mcg by  mouth.         . ferrous sulfate 325 (65 FE) MG tablet   Oral   Take 325 mg by mouth daily with breakfast.         . gabapentin (NEURONTIN) 600 MG tablet   Oral   Take 600 mg by mouth 3 (three) times daily.         [name] Kitchen HYDROcodone-acetaminophen (NORCO) 10-325 MG per tablet   Oral   Take 1 tablet by mouth every 6 (six) hours as needed.         [name] Kitchen levofloxacin (LEVAQUIN) 500 MG tablet   Oral   Take 1 tablet (500 mg total) by mouth daily.   7 tablet   0   . lisinopril (PRINIVIL,ZESTRIL) 5 MG tablet   Oral   Take 5 mg by mouth daily.         . Omega-3 Fatty Acids (FISH OIL) 1200 MG CAPS   Oral   Take by mouth.         . oxybutynin (DITROPAN-XL) 10 MG 24 hr tablet   Oral   Take 10 mg by mouth at bedtime.         . timolol (BETIMOL) 0.5 % ophthalmic solution      1 drop 2 (two) times daily.         [name] Kitchen  Vitamin D, Ergocalciferol, (DRISDOL) 50000 UNITS CAPS capsule   Oral   Take 50,000 Units by mouth every 7 (seven) days.           Allergies Metformin and related and Sulfa antibiotics  No family history on file.  Social History Social History  Substance Use Topics  . Smoking status: Former Research scientist (life sciences)  . Smokeless tobacco: None  . Alcohol Use: No    Review of Systems Constitutional: No fever/chills Eyes: No visual changes. ENT: Positive right-sided facial pain. Positive posterior drainage. Cardiovascular: Denies chest pain. Respiratory: Denies shortness of breath. Positive cough. Gastrointestinal: No abdominal pain.  No nausea, no vomiting.  Positive diarrhea.  No constipation. Positive decreased appetite. Genitourinary: Negative for dysuria. Musculoskeletal: Negative for back pain. Skin: Negative for rash. Neurological: Positive for headaches, no focal weakness or numbness.  10-point ROS otherwise negative.  ____________________________________________   PHYSICAL EXAM:  VITAL SIGNS: ED Triage Vitals  Enc Vitals Group     BP 07/08/15 1746  112/52 mmHg     Pulse Rate 07/08/15 1746 78     Resp 07/08/15 1746 16     Temp 07/08/15 1746 98.1 F (36.7 C)     Temp Source 07/08/15 1746 Oral     SpO2 07/08/15 1746 96 %     Weight 07/08/15 1746 185 lb (83.915 kg)     Height 07/08/15 1746 5' (1.524 m)     Head Cir --      Peak Flow --      Pain Score 07/08/15 1811 4     Pain Loc --      Pain Edu? --      Excl. in [place]? --     Constitutional: Alert and oriented. Well appearing and in no acute distress. Eyes: Conjunctivae are normal. PERRL. EOMI. Head: Atraumatic.Positive right frontal sinus pain on percussion. Nose: Mild nasal congestion is present. No rhinorrhea. Mouth/Throat: Mucous membranes are moist.  Oropharynx non-erythematous, but positive posterior drainage.. Neck: No stridor.  Supple. Hematological/Lymphatic/Immunilogical: No cervical lymphadenopathy. Cardiovascular: Normal rate, regular rhythm. Grossly normal heart sounds.  Good peripheral circulation. Respiratory: Normal respiratory effort.  No retractions. Lungs bilateral expiratory wheezes heard throughout which clears slightly with cough. Gastrointestinal: Soft and nontender. No distention.  Musculoskeletal: Moves upper and lower extremities without any difficulty. Neurologic:  Normal speech and language. No gross focal neurologic deficits are appreciated. No gait instability. Skin:  Skin is warm, dry and intact. No rash noted. Psychiatric: Mood and affect are normal. Speech and behavior are normal.  ____________________________________________   LABS (all labs ordered are listed, but only abnormal results are displayed)  Labs Reviewed - No data to display   RADIOLOGY  X-rays x-ray per radiologist shows cardiomegaly unchanged but no acute findings. ____________________________________________   PROCEDURES  Procedure(s) performed: None  Critical Care performed: No  ____________________________________________   INITIAL IMPRESSION / ASSESSMENT AND  PLAN / ED COURSE  Pertinent labs & imaging results that were available during my care of the patient were reviewed by me and considered in my medical decision making (see chart for details).  Patient received a DuoNeb treatment prior to discharge and improved significantly. We discussed patient's blood sugar readings and with her diabetes in poor control we deferred any steroids at this time. Patient was started on albuterol inhaler as needed for wheezing. Levaquin 500 mg one daily for 7 days and Tessalon Perles as needed for coughing. Patient is to follow-up with her primary care doctor if any continued problems at which  time they can discuss the use of steroids. ____________________________________________   FINAL CLINICAL IMPRESSION(S) / ED DIAGNOSES  Final diagnoses:  Acute frontal sinusitis, recurrence not specified  Acute bronchitis, unspecified organism      NEW MEDICATIONS STARTED DURING THIS VISIT:  New Prescriptions   ALBUTEROL (PROVENTIL HFA;VENTOLIN HFA) 108 (90 BASE) MCG/ACT INHALER    Inhale 2 puffs into the lungs every 6 (six) hours as needed for wheezing or shortness of breath.   BENZONATATE (TESSALON PERLES) 100 MG CAPSULE    Take 1 capsule (100 mg total) by mouth 3 (three) times daily as needed for cough.   LEVOFLOXACIN (LEVAQUIN) 500 MG TABLET    Take 1 tablet (500 mg total) by mouth daily.     Note:  This document was prepared using Dragon voice recognition software and may include unintentional dictation errors.    Johnn Hai, PA-C 07/08/15 2027  Nena Polio, MD 07/08/15 2029

## 2015-07-29 DIAGNOSIS — M545 Low back pain: Secondary | ICD-10-CM | POA: Diagnosis not present

## 2015-07-29 DIAGNOSIS — Z79899 Other long term (current) drug therapy: Secondary | ICD-10-CM | POA: Diagnosis not present

## 2015-07-29 DIAGNOSIS — E1165 Type 2 diabetes mellitus with hyperglycemia: Secondary | ICD-10-CM | POA: Diagnosis not present

## 2015-07-29 DIAGNOSIS — E559 Vitamin D deficiency, unspecified: Secondary | ICD-10-CM | POA: Diagnosis not present

## 2015-07-29 DIAGNOSIS — I2510 Atherosclerotic heart disease of native coronary artery without angina pectoris: Secondary | ICD-10-CM | POA: Diagnosis not present

## 2015-07-29 DIAGNOSIS — E7800 Pure hypercholesterolemia, unspecified: Secondary | ICD-10-CM | POA: Diagnosis not present

## 2015-07-30 ENCOUNTER — Telehealth: Payer: Self-pay | Admitting: Physician Assistant

## 2015-07-30 NOTE — Telephone Encounter (Signed)
Yes that is fine

## 2015-07-30 NOTE — Telephone Encounter (Signed)
Called pt.to schd a new pt appt.   She said she was being set up for a colonoscopy by her primary care doctor that she is presently seeing.  She will contact us after the colonoscopy and schedule an appt with [place].  She does want to change doctors, but thinks she needs to wait until after the colonoscopy is done.  I think daughter is encouraging mom to change physicians  . Con [place]

## 2015-07-30 NOTE — Telephone Encounter (Signed)
Please review.  Thanks,  -Joseline 

## 2015-07-30 NOTE — Telephone Encounter (Signed)
This lady called in to say she would like to know if you would take her on a new patient.  Her daughter [name] sees you and referred her .  This lady does have diabetes and taking insulin.  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  Her call back is [phone].  Thanks, [employer]

## 2015-07-31 NOTE — Telephone Encounter (Signed)
When she calls back please make sure she is not wanting to stay with her current PCP. If she has a good relationship with him and trust him, it is better to continue that relationship. If she still wished to switch, however, I will take her on as a new patient.

## 2015-08-06 DIAGNOSIS — Z86010 Personal history of colonic polyps: Secondary | ICD-10-CM | POA: Diagnosis not present

## 2015-08-06 DIAGNOSIS — K580 Irritable bowel syndrome with diarrhea: Secondary | ICD-10-CM | POA: Diagnosis not present

## 2015-08-21 DIAGNOSIS — Z7982 Long term (current) use of aspirin: Secondary | ICD-10-CM | POA: Diagnosis not present

## 2015-08-21 DIAGNOSIS — Z09 Encounter for follow-up examination after completed treatment for conditions other than malignant neoplasm: Secondary | ICD-10-CM | POA: Diagnosis not present

## 2015-08-21 DIAGNOSIS — E119 Type 2 diabetes mellitus without complications: Secondary | ICD-10-CM | POA: Diagnosis not present

## 2015-08-21 DIAGNOSIS — Z1211 Encounter for screening for malignant neoplasm of colon: Secondary | ICD-10-CM | POA: Diagnosis not present

## 2015-08-21 DIAGNOSIS — Z8673 Personal history of transient ischemic attack (TIA), and cerebral infarction without residual deficits: Secondary | ICD-10-CM | POA: Diagnosis not present

## 2015-08-21 DIAGNOSIS — I252 Old myocardial infarction: Secondary | ICD-10-CM | POA: Diagnosis not present

## 2015-08-21 DIAGNOSIS — K6389 Other specified diseases of intestine: Secondary | ICD-10-CM | POA: Diagnosis not present

## 2015-08-21 DIAGNOSIS — K5730 Diverticulosis of large intestine without perforation or abscess without bleeding: Secondary | ICD-10-CM | POA: Diagnosis not present

## 2015-08-21 DIAGNOSIS — J449 Chronic obstructive pulmonary disease, unspecified: Secondary | ICD-10-CM | POA: Diagnosis not present

## 2015-08-21 DIAGNOSIS — I10 Essential (primary) hypertension: Secondary | ICD-10-CM | POA: Diagnosis not present

## 2015-08-21 DIAGNOSIS — D123 Benign neoplasm of transverse colon: Secondary | ICD-10-CM | POA: Diagnosis not present

## 2015-08-21 DIAGNOSIS — D126 Benign neoplasm of colon, unspecified: Secondary | ICD-10-CM | POA: Diagnosis not present

## 2015-08-21 DIAGNOSIS — K219 Gastro-esophageal reflux disease without esophagitis: Secondary | ICD-10-CM | POA: Diagnosis not present

## 2015-08-21 DIAGNOSIS — Z86010 Personal history of colonic polyps: Secondary | ICD-10-CM | POA: Diagnosis not present

## 2015-08-21 DIAGNOSIS — I2510 Atherosclerotic heart disease of native coronary artery without angina pectoris: Secondary | ICD-10-CM | POA: Diagnosis not present

## 2015-08-21 DIAGNOSIS — Z87891 Personal history of nicotine dependence: Secondary | ICD-10-CM | POA: Diagnosis not present

## 2015-08-21 DIAGNOSIS — E785 Hyperlipidemia, unspecified: Secondary | ICD-10-CM | POA: Diagnosis not present

## 2015-08-21 HISTORY — PX: OTHER SURGICAL HISTORY: SHX169

## 2015-08-21 LAB — HM COLONOSCOPY

## 2015-09-02 ENCOUNTER — Ambulatory Visit (INDEPENDENT_AMBULATORY_CARE_PROVIDER_SITE_OTHER): Payer: Medicare Other | Admitting: Physician Assistant

## 2015-09-02 ENCOUNTER — Other Ambulatory Visit: Payer: Self-pay | Admitting: Physician Assistant

## 2015-09-02 ENCOUNTER — Encounter: Payer: Self-pay | Admitting: Physician Assistant

## 2015-09-02 VITALS — BP 110/60 | HR 70 | Temp 97.9°F | Resp 16 | Wt 186.6 lb

## 2015-09-02 DIAGNOSIS — E1142 Type 2 diabetes mellitus with diabetic polyneuropathy: Secondary | ICD-10-CM

## 2015-09-02 DIAGNOSIS — D519 Vitamin B12 deficiency anemia, unspecified: Secondary | ICD-10-CM

## 2015-09-02 DIAGNOSIS — Z7189 Other specified counseling: Secondary | ICD-10-CM

## 2015-09-02 DIAGNOSIS — E785 Hyperlipidemia, unspecified: Secondary | ICD-10-CM | POA: Diagnosis not present

## 2015-09-02 DIAGNOSIS — Z794 Long term (current) use of insulin: Secondary | ICD-10-CM | POA: Diagnosis not present

## 2015-09-02 DIAGNOSIS — Z7689 Persons encountering health services in other specified circumstances: Secondary | ICD-10-CM

## 2015-09-02 DIAGNOSIS — I5022 Chronic systolic (congestive) heart failure: Secondary | ICD-10-CM

## 2015-09-02 NOTE — Progress Notes (Signed)
Patient: Andrea [name] Female    DOB: 1945-03-30   70 y.o.   MRN: [id number] Visit Date: 09/02/2015  Today's Provider: Mar Daring, PA-C   Chief Complaint  Patient presents with  . New Patient (Initial Visit)    Establish Care   Subjective:    HPI Patient is here to establish care as new patient and follow-up diabetes. She reports she had a colonoscopy 08/21/2015, Mammogram 2016 normal per patient, BMD 2014-Osteoporosis and Pneumonia vaccine 2016. Physicians from whom patient received care within the 2 years -Dr. Cloyd Stagers and Dr. Annice Needy.   Diabetes Mellitus Type II, Follow-up:   Lab Results  Component Value Date   HGBA1C 7.5* 11/26/2013    Last seen for diabetes 1 months ago.  Management since then includes none. She reports good compliance with treatment. She is not having side effects.  Current symptoms include none and have been stable. Home blood sugar records: fasting range: 130-200  Episodes of hypoglycemia? no   Current Insulin Regimen: Lantus. Most Recent Eye Exam: per patient is was this 2017 around April. Weight trend: fluctuating a lot Prior visit with dietician: no Current diet: in general, an "unhealthy" diet Current exercise: none  Pertinent Labs:    Component Value Date/Time   CREATININE 1.54* 11/06/2014 2101   CREATININE 1.02 01/01/2014 0643    Wt Readings from Last 3 Encounters:  09/02/15 186 lb 9.6 oz (84.641 kg)  07/08/15 185 lb (83.915 kg)  11/06/14 197 lb (89.359 kg)    ------------------------------------------------------------------------       Allergies  Allergen Reactions  . Metformin And Related Diarrhea  . Sulfa Antibiotics Nausea And Vomiting   Current Meds  Medication Sig  . albuterol (PROVENTIL HFA;VENTOLIN HFA) 108 (90 Base) MCG/ACT inhaler Inhale 2 puffs into the lungs every 6 (six) hours as needed for wheezing or shortness of breath.  [name] Kitchen aspirin 81 MG tablet Take 81 mg by mouth daily.  [name] Kitchen  atorvastatin (LIPITOR) 80 MG tablet Take 80 mg by mouth daily.  . Cyanocobalamin (B-12) 1000 MCG CAPS Take 1,500 mcg by mouth.  . gabapentin (NEURONTIN) 600 MG tablet Take 600 mg by mouth 3 (three) times daily.  [name] Kitchen HYDROcodone-acetaminophen (NORCO) 10-325 MG per tablet Take 1 tablet by mouth every 6 (six) hours as needed.  . Insulin Glargine (LANTUS SOLOSTAR) 100 UNIT/ML Solostar Pen   . lisinopril (PRINIVIL,ZESTRIL) 5 MG tablet Take 5 mg by mouth daily.  . metoprolol succinate (TOPROL-XL) 25 MG 24 hr tablet   . Omega-3 Fatty Acids (FISH OIL) 1200 MG CAPS Take by mouth.  [name] Kitchen omeprazole (PRILOSEC) 40 MG capsule   . oxybutynin (DITROPAN-XL) 10 MG 24 hr tablet Take 10 mg by mouth at bedtime.  . timolol (BETIMOL) 0.5 % ophthalmic solution 1 drop 2 (two) times daily.  . Vitamin D, Ergocalciferol, (DRISDOL) 50000 UNITS CAPS capsule Take 50,000 Units by mouth every 7 (seven) days.    Review of Systems  Constitutional: Negative.   HENT: Negative.   Eyes: Negative.   Respiratory: Positive for cough and wheezing.   Cardiovascular: Negative.   Gastrointestinal: Negative.   Endocrine: Negative.   Genitourinary: Negative.   Musculoskeletal: Positive for myalgias, back pain, arthralgias and gait problem.  Skin: Negative.   Allergic/Immunologic: Negative.   Neurological: Negative.   Hematological: Bruises/bleeds easily.  Psychiatric/Behavioral: Negative.     Social History  Substance Use Topics  . Smoking status: Former Research scientist (life sciences)  . Smokeless tobacco: Not on file  Comment: Quit 2014  . Alcohol Use: No   Objective:   BP 110/60 mmHg  Pulse 70  Temp(Src) 97.9 F (36.6 C) (Oral)  Resp 16  Wt 186 lb 9.6 oz (84.641 kg)  Physical Exam  Constitutional: She is oriented to person, place, and time. She appears well-developed and well-nourished. No distress.  HENT:  Head: Normocephalic and atraumatic.  Right Ear: External ear normal.  Left Ear: External ear normal.  Nose: Nose normal.    Mouth/Throat: Oropharynx is clear and moist. No oropharyngeal exudate.  Eyes: Conjunctivae and EOM are normal. Pupils are equal, round, and reactive to light. Right eye exhibits no discharge. Left eye exhibits no discharge. No scleral icterus.  Neck: Normal range of motion. Neck supple. No JVD present. No tracheal deviation present. No thyromegaly present.  Cardiovascular: Normal rate, regular rhythm, normal heart sounds and intact distal pulses.  Exam reveals no gallop and no friction rub.   No murmur heard. Pulmonary/Chest: Effort normal. No respiratory distress. She has wheezes. She has no rales. She exhibits no tenderness.  Abdominal: Soft. Bowel sounds are normal. She exhibits no distension and no mass. There is no tenderness. There is no rebound and no guarding.  Musculoskeletal: Normal range of motion. She exhibits no edema or tenderness.  Lymphadenopathy:    She has no cervical adenopathy.  Neurological: She is alert and oriented to person, place, and time.  Skin: Skin is warm and dry. No rash noted. She is not diaphoretic.  Psychiatric: She has a normal mood and affect. Her behavior is normal. Judgment and thought content normal.  Vitals reviewed.       Assessment & Plan:     1. Establishing care with new doctor, encounter for Previous patient of Cyndi Bender, PA-C. I will see her back in 3 months for medicare wellness.  2. Type 2 diabetes mellitus with diabetic polyneuropathy, with long-term current use of insulin (HCC) Stable. Will check labs as below and f/u pending lab results. - CBC with Differential - Comprehensive metabolic panel - Hemoglobin A1C  3. Anemia due to vitamin B12 deficiency Will check labs as below and f/u pending results. - CBC with Differential - TSH  4. HLD (hyperlipidemia) Will check labs as below and f/u pending results. - Lipid panel  5. Chronic systolic heart failure ([place]) Will check labs as below and f/u pending results. - Comprehensive  metabolic panel       Mar Daring, PA-C  [facility]

## 2015-09-02 NOTE — Patient Instructions (Signed)

## 2015-09-03 ENCOUNTER — Telehealth: Payer: Self-pay

## 2015-09-03 DIAGNOSIS — M545 Low back pain, unspecified: Secondary | ICD-10-CM

## 2015-09-03 LAB — COMPREHENSIVE METABOLIC PANEL
ALBUMIN: 3.9 g/dL (ref 3.6–4.8)
ALT: 13 IU/L (ref 0–32)
AST: 13 IU/L (ref 0–40)
Albumin/Globulin Ratio: 1.2 (ref 1.2–2.2)
Alkaline Phosphatase: 106 IU/L (ref 39–117)
BUN / CREAT RATIO: 18 (ref 12–28)
BUN: 17 mg/dL (ref 8–27)
Bilirubin Total: 0.6 mg/dL (ref 0.0–1.2)
CO2: 26 mmol/L (ref 18–29)
CREATININE: 0.95 mg/dL (ref 0.57–1.00)
Calcium: 9.4 mg/dL (ref 8.7–10.3)
Chloride: 101 mmol/L (ref 96–106)
GFR calc Af Amer: 71 mL/min/{1.73_m2} (ref >59)
GFR, EST NON AFRICAN AMERICAN: 61 mL/min/{1.73_m2} (ref >59)
GLOBULIN, TOTAL: 3.2 g/dL (ref 1.5–4.5)
GLUCOSE: 114 mg/dL — AB (ref 65–99)
Potassium: 5.3 mmol/L — ABNORMAL HIGH (ref 3.5–5.2)
SODIUM: 139 mmol/L (ref 134–144)
Total Protein: 7.1 g/dL (ref 6.0–8.5)

## 2015-09-03 LAB — CBC WITH DIFFERENTIAL/PLATELET
BASOS: 0 %
Basophils Absolute: 0 10*3/uL (ref 0.0–0.2)
EOS (ABSOLUTE): 0.4 10*3/uL (ref 0.0–0.4)
EOS: 6 %
HEMATOCRIT: 40.9 % (ref 34.0–46.6)
Hemoglobin: 13.1 g/dL (ref 11.1–15.9)
Immature Grans (Abs): 0 10*3/uL (ref 0.0–0.1)
Immature Granulocytes: 0 %
LYMPHS ABS: 1.6 10*3/uL (ref 0.7–3.1)
Lymphs: 29 %
MCH: 29.7 pg (ref 26.6–33.0)
MCHC: 32 g/dL (ref 31.5–35.7)
MCV: 93 fL (ref 79–97)
MONOS ABS: 0.3 10*3/uL (ref 0.1–0.9)
Monocytes: 5 %
Neutrophils Absolute: 3.3 10*3/uL (ref 1.4–7.0)
Neutrophils: 60 %
PLATELETS: 197 10*3/uL (ref 150–379)
RBC: 4.41 x10E6/uL (ref 3.77–5.28)
RDW: 14.5 % (ref 12.3–15.4)
WBC: 5.6 10*3/uL (ref 3.4–10.8)

## 2015-09-03 LAB — HEMOGLOBIN A1C
Est. average glucose Bld gHb Est-mCnc: 212 mg/dL
HEMOGLOBIN A1C: 9 % — AB (ref 4.8–5.6)

## 2015-09-03 LAB — LIPID PANEL
CHOL/HDL RATIO: 2.1 ratio (ref 0.0–4.4)
Cholesterol, Total: 142 mg/dL (ref 100–199)
HDL: 67 mg/dL (ref >39)
LDL Calculated: 48 mg/dL (ref 0–99)
Triglycerides: 134 mg/dL (ref 0–149)
VLDL CHOLESTEROL CAL: 27 mg/dL (ref 5–40)

## 2015-09-03 LAB — TSH: TSH: 0.967 u[IU]/mL (ref 0.450–4.500)

## 2015-09-03 MED ORDER — HYDROCODONE-ACETAMINOPHEN 10-325 MG PO TABS: 1.0000 | ORAL_TABLET | Freq: Three times a day (TID) | ORAL | Status: DC | PRN

## 2015-09-03 NOTE — Telephone Encounter (Signed)
Patient advised.

## 2015-09-03 NOTE — Telephone Encounter (Signed)
Rx printed and will be placed up front for pick up

## 2015-09-03 NOTE — Telephone Encounter (Signed)
-----   Message from Mar Daring, [place] sent at 09/03/2015  8:20 AM EDT ----- All labs are within normal limits and stable with exception of HgBA1c which has increased to 9.0. I would like for you to come back in the next 2 weeks or so. Thanks! -JB

## 2015-09-03 NOTE — Telephone Encounter (Signed)
Advised patient as directed below. Scheduled her for a 30 minutes appointment on 07/13 follow-up diabetes and back pain. She wants refills on the HYDROcodone-acetaminophen ([place]) 10-325 MG per tablet She will have enough until 07/07. Asking if you can give her a prescription until she comes in to see you.  Please advise.  Thanks.

## 2015-09-03 NOTE — Telephone Encounter (Signed)
LMTCB  Thanks,  -Joseline 

## 2015-09-18 ENCOUNTER — Ambulatory Visit (INDEPENDENT_AMBULATORY_CARE_PROVIDER_SITE_OTHER): Payer: Medicare Other | Admitting: Physician Assistant

## 2015-09-18 ENCOUNTER — Encounter: Payer: Self-pay | Admitting: Physician Assistant

## 2015-09-18 VITALS — BP 130/60 | HR 62 | Temp 98.0°F | Resp 18 | Wt 188.0 lb

## 2015-09-18 DIAGNOSIS — E1142 Type 2 diabetes mellitus with diabetic polyneuropathy: Secondary | ICD-10-CM

## 2015-09-18 DIAGNOSIS — Z794 Long term (current) use of insulin: Secondary | ICD-10-CM

## 2015-09-18 LAB — POCT GLYCOSYLATED HEMOGLOBIN (HGB A1C): Hemoglobin A1C: 9

## 2015-09-18 MED ORDER — EMPAGLIFLOZIN 10 MG PO TABS: 10.0000 mg | ORAL_TABLET | Freq: Every day | ORAL | Status: DC

## 2015-09-18 MED ORDER — EMPAGLIFLOZIN 25 MG PO TABS: 25.0000 mg | ORAL_TABLET | Freq: Every day | ORAL | Status: DC

## 2015-09-18 NOTE — Progress Notes (Signed)
Patient: Andrea [name] Female    DOB: 07-06-1945   70 y.o.   MRN: [id number] Visit Date: 09/18/2015  Today's Provider: Mar Daring, PA-C   No chief complaint on file.  Subjective:    HPI Patient is here to follow-up Type 2 Diabetes Mellitus. HgBA1c increase from 7.5 to 9.0 in one year. She reports no visual disturbances, frequent urination or frequent urination or foot ulcerations. She is currently using Lantus 65 units with biggest meal and 65 units at bedtime with a snack. She has previously been on Actos, glipizide and Levemir. She was changed to Lantus by her previous PCP. She cannot recall why he discontinued the actos or glipizide.   Back Pain:She is here to follow-up on back pain. This is a chronic problem. Her hydrocodone-atemanophen was refilled on 08/29/15. She is stable currently.      Allergies  Allergen Reactions  . Metformin And Related Diarrhea  . Sulfa Antibiotics Nausea And Vomiting   Current Meds  Medication Sig  . albuterol (PROVENTIL HFA;VENTOLIN HFA) 108 (90 Base) MCG/ACT inhaler Inhale 2 puffs into the lungs every 6 (six) hours as needed for wheezing or shortness of breath.  [name] Kitchen aspirin 81 MG tablet Take 81 mg by mouth daily.  [name] Kitchen atorvastatin (LIPITOR) 80 MG tablet Take 80 mg by mouth daily.  . Blood Glucose Calibration (ONETOUCH VERIO) SOLN   . Blood Glucose Monitoring Suppl (ONETOUCH VERIO IQ SYSTEM) w/Device KIT   . Cyanocobalamin (B-12) 1000 MCG CAPS Take 1,500 mcg by mouth.  . gabapentin (NEURONTIN) 600 MG tablet Take 600 mg by mouth 3 (three) times daily.  [name] Kitchen HYDROcodone-acetaminophen (NORCO) 10-325 MG tablet Take 1 tablet by mouth every 8 (eight) hours as needed.  . Insulin Glargine (LANTUS SOLOSTAR) 100 UNIT/ML Solostar Pen   . lisinopril (PRINIVIL,ZESTRIL) 5 MG tablet Take 5 mg by mouth daily.  . metoprolol succinate (TOPROL-XL) 25 MG 24 hr tablet   . Omega-3 Fatty Acids (FISH OIL) 1200 MG CAPS Take by mouth.  [name] Kitchen omeprazole (PRILOSEC) 40  MG capsule   . ONETOUCH DELICA LANCETS 85I MISC   . ONETOUCH VERIO test strip   . oxybutynin (DITROPAN-XL) 10 MG 24 hr tablet Take 10 mg by mouth at bedtime.  . timolol (BETIMOL) 0.5 % ophthalmic solution 1 drop 2 (two) times daily.  . Vitamin D, Ergocalciferol, (DRISDOL) 50000 UNITS CAPS capsule Take 50,000 Units by mouth every 7 (seven) days.    Review of Systems  Constitutional: Negative for fatigue.  Respiratory: Negative.   Cardiovascular: Negative for chest pain, palpitations and leg swelling.  Gastrointestinal: Negative.   Endocrine: Negative for cold intolerance, heat intolerance and polyuria.  Genitourinary: Negative for urgency and frequency.  Neurological: Negative for dizziness, light-headedness and headaches.    Social History  Substance Use Topics  . Smoking status: Former Research scientist (life sciences)  . Smokeless tobacco: Not on file     Comment: Quit 2014  . Alcohol Use: No   Objective:   BP 130/60 mmHg  Pulse 62  Temp(Src) 98 F (36.7 C) (Oral)  Resp 18  Wt 188 lb (85.276 kg)  Physical Exam  Constitutional: She appears well-developed and well-nourished. No distress.  Neck: Normal range of motion. Neck supple.  Cardiovascular: Normal rate, regular rhythm and normal heart sounds.  Exam reveals no gallop and no friction rub.   No murmur heard. Pulmonary/Chest: Effort normal and breath sounds normal. No respiratory distress. She has no wheezes. She has no rales.  Skin:  She is not diaphoretic.  Vitals reviewed.     Assessment & Plan:     1. Type 2 diabetes mellitus with diabetic polyneuropathy, with long-term current use of insulin (HCC) Will add Jardiance 10mg for her to take x one week, then increase to Jardiance 25mg. Continue Lantus as prescribed. Will recheck in 2-3 months. She is to call if she has any adverse reaction to the medication.  - POCT glycosylated hemoglobin (Hb A1C) - empagliflozin (JARDIANCE) 10 MG TABS tablet; Take 10 mg by mouth daily.  Dispense: 7 tablet;  Refill: 0 - empagliflozin (JARDIANCE) 25 MG TABS tablet; Take 25 mg by mouth daily.  Dispense: 21 tablet; Refill: 0       Jennifer M Burnette, PA-C  [facility] 

## 2015-09-18 NOTE — Patient Instructions (Signed)
Empagliflozin oral tablets What is this medicine? EMPAGLIGLOZIN (EM pa gli FLOE zin) helps to treat type 2 diabetes. It helps to control blood sugar. Treatment is combined with diet and exercise. This medicine may be used for other purposes; ask your health care provider or pharmacist if you have questions. What should I tell my health care provider before I take this medicine? They need to know if you have any of these conditions: -dehydration -diabetic ketoacidosis -diet low in salt -eating less due to illness, surgery, dieting, or any other reason -having surgery -high cholesterol -high levels of potassium in the blood -history of pancreatitis or pancreas problems -history of yeast infection of the penis or vagina -if you often drink alcohol -infections in the bladder, kidneys, or urinary tract -kidney disease -liver disease -low blood pressure -on hemodialysis -problems urinating -type 1 diabetes -uncircumcised female -an unusual or allergic reaction to empagliflozin, other medicines, foods, dyes, or preservatives -pregnant or trying to get pregnant -breast-feeding How should I use this medicine? Take this medicine by mouth with a glass of water. Follow the directions on the prescription label. Take it in the morning, with or without food. Take your dose at the same time each day. Do not take more often than directed. Do not stop taking except on your doctor's advice. Talk to your pediatrician regarding the use of this medicine in children. Special care may be needed. Overdosage: If you think you have taken too much of this medicine contact a poison control center or emergency room at once. NOTE: This medicine is only for you. Do not share this medicine with others. What if I miss a dose? If you miss a dose, take it as soon as you can. If it is almost time for your next dose, take only that dose. Do not take double or extra doses. What may interact with this medicine? Do not take  this medicine with any of the following medications: -gatifloxacin This medicine may also interact with the following medications: -alcohol -certain medicines for blood pressure, heart disease -diuretics This list may not describe all possible interactions. Give your health care provider a list of all the medicines, herbs, non-prescription drugs, or dietary supplements you use. Also tell them if you smoke, drink alcohol, or use illegal drugs. Some items may interact with your medicine. What should I watch for while using this medicine? Visit your doctor or health care professional for regular checks on your progress. This medicine can cause a serious condition in which there is too much acid in the blood. If you develop nausea, vomiting, stomach pain, unusual tiredness, or breathing problems, stop taking this medicine and call your doctor right away. If possible, use a ketone dipstick to check for ketones in your urine. A test called the HbA1C (A1C) will be monitored. This is a simple blood test. It measures your blood sugar control over the last 2 to 3 months. You will receive this test every 3 to 6 months. Learn how to check your blood sugar. Learn the symptoms of low and high blood sugar and how to manage them. Always carry a quick-source of sugar with you in case you have symptoms of low blood sugar. Examples include hard sugar candy or glucose tablets. Make sure others know that you can choke if you eat or drink when you develop serious symptoms of low blood sugar, such as seizures or unconsciousness. They must get medical help at once. Tell your doctor or health care professional if you  have high blood sugar. You might need to change the dose of your medicine. If you are sick or exercising more than usual, you might need to change the dose of your medicine. Do not skip meals. Ask your doctor or health care professional if you should avoid alcohol. Many nonprescription cough and cold products  contain sugar or alcohol. These can affect blood sugar. Wear a medical ID bracelet or chain, and carry a card that describes your disease and details of your medicine and dosage times. What side effects may I notice from receiving this medicine? Side effects that you should report to your doctor or health care professional as soon as possible: -allergic reactions like skin rash, itching or hives, swelling of the face, lips, or tongue -breathing problems -dizziness -fast or irregular heartbeat -feeling faint or lightheaded, falls -muscle weakness -nausea, vomiting, unusual stomach upset or pain -signs and symptoms of low blood sugar such as feeling anxious, confusion, dizziness, increased hunger, unusually weak or tired, sweating, shakiness, cold, irritable, headache, blurred vision, fast heartbeat, loss of consciousness -signs and symptoms of a urinary tract infection, such as fever, chills, a burning feeling when urinating, blood in the urine, back pain -trouble passing urine or change in the amount of urine, including an urgent need to urinate more often, in larger amounts, or at night -penile discharge, itching, or pain in men -unusual tiredness -vaginal discharge, itching, or odor in women Side effects that usually do not require medical attention (Report these to your doctor or health care professional if they continue or are bothersome.): -joint pain -mild increase in urination -thirsty This list may not describe all possible side effects. Call your doctor for medical advice about side effects. You may report side effects to FDA at 1-800-FDA-1088. Where should I keep my medicine? Keep out of the reach of children. Store at room temperature between 20 and 25 degrees C (68 and 77 degrees F). Throw away any unused medicine after the expiration date. NOTE: This sheet is a summary. It may not cover all possible information. If you have questions about this medicine, talk to your doctor,  pharmacist, or health care provider.    2016, Elsevier/Gold Standard. (2014-02-12 11:37:10)

## 2015-10-02 ENCOUNTER — Telehealth: Payer: Self-pay

## 2015-10-02 DIAGNOSIS — Z794 Long term (current) use of insulin: Secondary | ICD-10-CM

## 2015-10-02 DIAGNOSIS — E1142 Type 2 diabetes mellitus with diabetic polyneuropathy: Secondary | ICD-10-CM

## 2015-10-02 MED ORDER — EMPAGLIFLOZIN 25 MG PO TABS: 25.0000 mg | ORAL_TABLET | Freq: Every day | ORAL | 0 refills | Status: DC

## 2015-10-02 MED ORDER — INSULIN SYRINGE-NEEDLE U-100 29G 1 ML MISC: 6 refills | Status: DC

## 2015-10-02 MED ORDER — INSULIN GLARGINE 100 UNIT/ML ~~LOC~~ SOLN: 65.0000 [IU] | Freq: Two times a day (BID) | SUBCUTANEOUS | 11 refills | Status: DC

## 2015-10-02 MED ORDER — ALCOHOL PREP PADS: MEDICATED_PAD | 6 refills | Status: DC

## 2015-10-02 NOTE — Telephone Encounter (Signed)
Patient would like Samples of Andrea [name] 25mg . She would also like to change the Lantus pen to the Lantus Vial due to cost. Patient would like to pick up samples today if possible. I didn't pull the lantus down for re-order because the one in patient chart is for the pen, and patient wants the Vial.

## 2015-10-02 NOTE — Telephone Encounter (Signed)
LM with detailed message as directed below.  Thanks,  -Derenda Giddings

## 2015-10-02 NOTE — Telephone Encounter (Signed)
Have 4 bottles of jardiance for her to pick up and sent new Rx with supplies to [facility] for lantus Rx.

## 2015-10-23 DIAGNOSIS — I5022 Chronic systolic (congestive) heart failure: Secondary | ICD-10-CM | POA: Diagnosis not present

## 2015-10-23 DIAGNOSIS — I2510 Atherosclerotic heart disease of native coronary artery without angina pectoris: Secondary | ICD-10-CM | POA: Diagnosis not present

## 2015-10-23 DIAGNOSIS — E785 Hyperlipidemia, unspecified: Secondary | ICD-10-CM | POA: Diagnosis not present

## 2015-10-24 ENCOUNTER — Other Ambulatory Visit: Payer: Self-pay

## 2015-10-24 DIAGNOSIS — K219 Gastro-esophageal reflux disease without esophagitis: Secondary | ICD-10-CM

## 2015-10-24 MED ORDER — OMEPRAZOLE 40 MG PO CPDR: 40.0000 mg | DELAYED_RELEASE_CAPSULE | Freq: Every day | ORAL | 3 refills | Status: DC

## 2015-10-24 NOTE — Telephone Encounter (Signed)
Refill request received from [facility] requesting Omeprazole 40 mg.   Thanks, [employer]

## 2015-10-27 ENCOUNTER — Other Ambulatory Visit: Payer: Self-pay | Admitting: Physician Assistant

## 2015-10-27 ENCOUNTER — Telehealth: Payer: Self-pay | Admitting: Physician Assistant

## 2015-10-27 DIAGNOSIS — M545 Low back pain, unspecified: Secondary | ICD-10-CM

## 2015-10-27 MED ORDER — HYDROCODONE-ACETAMINOPHEN 10-325 MG PO TABS: 1.0000 | ORAL_TABLET | Freq: Three times a day (TID) | ORAL | 0 refills | Status: DC | PRN

## 2015-10-27 NOTE — Telephone Encounter (Signed)
Samples left at front desk. Okay per Anderson Malta. Patient is aware.

## 2015-10-27 NOTE — Telephone Encounter (Signed)
Pt contacted office for refill request on the following medications: HYDROcodone-acetaminophen (NORCO) 10-325 MG tablet  Last written: 09/03/15 Last OV: 09/18/15 Please advise. Thanks TNP

## 2015-10-27 NOTE — Telephone Encounter (Signed)
Patient advised RX is ready for pick up.   Thanks, [employer]

## 2015-10-27 NOTE — Telephone Encounter (Signed)
Pt is requesting samples of empagliflozin (JARDIANCE) 25 MG TABS tablet.  CB# [id number]

## 2015-11-11 ENCOUNTER — Encounter: Payer: Self-pay | Admitting: Physician Assistant

## 2015-11-11 ENCOUNTER — Ambulatory Visit (INDEPENDENT_AMBULATORY_CARE_PROVIDER_SITE_OTHER): Payer: Medicare Other | Admitting: Physician Assistant

## 2015-11-11 VITALS — BP 128/70 | HR 67 | Temp 97.6°F | Resp 16 | Ht 60.0 in | Wt 183.2 lb

## 2015-11-11 DIAGNOSIS — Z1239 Encounter for other screening for malignant neoplasm of breast: Secondary | ICD-10-CM

## 2015-11-11 DIAGNOSIS — N3000 Acute cystitis without hematuria: Secondary | ICD-10-CM | POA: Diagnosis not present

## 2015-11-11 DIAGNOSIS — E113299 Type 2 diabetes mellitus with mild nonproliferative diabetic retinopathy without macular edema, unspecified eye: Secondary | ICD-10-CM | POA: Diagnosis not present

## 2015-11-11 DIAGNOSIS — M545 Low back pain, unspecified: Secondary | ICD-10-CM

## 2015-11-11 DIAGNOSIS — B379 Candidiasis, unspecified: Secondary | ICD-10-CM

## 2015-11-11 DIAGNOSIS — R300 Dysuria: Secondary | ICD-10-CM

## 2015-11-11 DIAGNOSIS — Z780 Asymptomatic menopausal state: Secondary | ICD-10-CM | POA: Diagnosis not present

## 2015-11-11 DIAGNOSIS — Z794 Long term (current) use of insulin: Secondary | ICD-10-CM

## 2015-11-11 DIAGNOSIS — Z0000 Encounter for general adult medical examination without abnormal findings: Secondary | ICD-10-CM

## 2015-11-11 LAB — POCT URINALYSIS DIPSTICK
Bilirubin, UA: NEGATIVE
GLUCOSE UA: 1000
KETONES UA: NEGATIVE
Nitrite, UA: POSITIVE
Protein, UA: NEGATIVE
SPEC GRAV UA: 1.01
UROBILINOGEN UA: 0.2
pH, UA: 6

## 2015-11-11 MED ORDER — NITROFURANTOIN MONOHYD MACRO 100 MG PO CAPS: 100.0000 mg | ORAL_CAPSULE | Freq: Two times a day (BID) | ORAL | 0 refills | Status: DC

## 2015-11-11 MED ORDER — HYDROCODONE-ACETAMINOPHEN 10-325 MG PO TABS: 1.0000 | ORAL_TABLET | Freq: Three times a day (TID) | ORAL | 0 refills | Status: DC | PRN

## 2015-11-11 MED ORDER — VITAMIN D (ERGOCALCIFEROL) 1.25 MG (50000 UNIT) PO CAPS: 50000.0000 [IU] | ORAL_CAPSULE | ORAL | 1 refills | Status: DC

## 2015-11-11 MED ORDER — FLUCONAZOLE 150 MG PO TABS: ORAL_TABLET | ORAL | 0 refills | Status: DC

## 2015-11-11 NOTE — Progress Notes (Signed)
Patient: Andrea [name], Female    DOB: 1945/11/08, 70 y.o.   MRN: [id number] Visit Date: 11/11/2015  Today's Provider: Mar Daring, PA-C   Chief Complaint  Patient presents with  . Medicare Wellness   Subjective:    Annual wellness visit [name] is a 70 y.o. female. She feels fairly well. She reports exercising none. She reports she is sleeping well.  Patient had her follow-up with Cardiology last month. She does not have to return for cardiology f/u for 6 months. She normally has seen Dr. Cloyd Stagers, [facility], but she went out on maternity leave. She recently saw Dr. Dwyane Dee in place of Dr. Cloyd Stagers and did not have a good relationship. She is going to see if Dr. Cloyd Stagers returns when it is time for her 6 month f/u and if not she would like to switch to a cardiologist in [place].   EKG:06/04/2013 Colonoscopy:08/21/15 Mammogram:07/17/14 Prevnar 13 (va) 09/10/14 Pneumovax 12/29/10 Eye Exam:05/14/15-Has a follow-up scheduled 11/13/15  She also has concerns of having increasing dysuria, frequency, and possible yeast infection. She has been having increased vaginal discharge that is a thick, white consistency. She denies much itching at this time. She is currently taking Jardiance for her diabetes. -----------------------------------------------------------  Review of Systems  Constitutional: Negative.        Irritability  HENT: Positive for congestion, drooling and voice change.   Eyes: Negative.   Respiratory: Positive for cough and wheezing. Negative for chest tightness.   Cardiovascular: Negative.   Gastrointestinal: Negative.   Endocrine: Negative.   Genitourinary: Positive for dysuria, frequency and vaginal discharge. Negative for flank pain, genital sores, hematuria, pelvic pain and urgency.       Thinks she has UTI and possible yeast infection  Musculoskeletal: Positive for arthralgias, back pain and gait problem.  Allergic/Immunologic: Negative.     Hematological: Bruises/bleeds easily.  Psychiatric/Behavioral: Positive for agitation.    Social History   Social History  . Marital status: Single    Spouse name: N/A  . Number of children: N/A  . Years of education: N/A   Occupational History  . Not on file.   Social History Main Topics  . Smoking status: Former Research scientist (life sciences)  . Smokeless tobacco: Never Used     Comment: Quit 2014  . Alcohol use No  . Drug use: No  . Sexual activity: Not on file   Other Topics Concern  . Not on file   Social History Narrative  . No narrative on file    Past Medical History:  Diagnosis Date  . Coronary artery disease   . Diabetes mellitus without complication ([place])   . MI (myocardial infarction) ([place])   . Pneumonia 2014  . TIA (transient ischemic attack)      Patient Active Problem List   Diagnosis Date Noted  . Morbid obesity ([place]) 04/16/2015  . History of colon polyps 11/23/2012  . Healed myocardial infarct 11/23/2012  . Bundle branch block, 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  . 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 Absolute anemia 08/17/2012  . Chronic obstructive pulmonary disease ([place]) 08/17/2012  . HLD (hyperlipidemia) 08/17/2012  . OP (osteoporosis) 08/17/2012  . Type 2 diabetes mellitus ([place]) 08/17/2012  . Chronic systolic heart failure ([place]) 07/30/2012  . 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 Angio plasty  1995  . HAND SURGERY  Left 2012  . HIP FRACTURE SURGERY  2015    Her family history is not on file.    Current Meds  Medication Sig  . Alcohol Swabs (ALCOHOL PREP) PADS Use twice daily prior to SQ injection of insulin to clean skin  . aspirin 81 MG tablet Take 81 mg by mouth daily.  [name] Kitchen atorvastatin (LIPITOR) 80 MG tablet Take 80 mg by mouth daily.  . Blood Glucose Calibration (ONETOUCH VERIO) SOLN   . Blood  Glucose Monitoring Suppl (ONETOUCH VERIO IQ SYSTEM) w/Device KIT   . Cyanocobalamin (B-12) 1000 MCG CAPS Take 1,500 mcg by mouth.  . empagliflozin (JARDIANCE) 25 MG TABS tablet Take 25 mg by mouth daily.  [name] Kitchen gabapentin (NEURONTIN) 600 MG tablet Take 600 mg by mouth 3 (three) times daily.  Derrill Memo ON 11/26/2015] HYDROcodone-acetaminophen (NORCO) 10-325 MG tablet Take 1 tablet by mouth every 8 (eight) hours as needed. Please do NOT fill until 11/26/15  . insulin glargine (LANTUS) 100 UNIT/ML injection Inject 0.65 mLs (65 Units total) into the skin 2 (two) times daily with a meal.  . Insulin Syringe-Needle U-100 29G 1 ML MISC Use to inject 65 units Lantus SQ BIDWC  . lisinopril (PRINIVIL,ZESTRIL) 5 MG tablet Take 5 mg by mouth daily.  . metoprolol succinate (TOPROL-XL) 25 MG 24 hr tablet   . Omega-3 Fatty Acids (FISH OIL) 1200 MG CAPS Take by mouth.  [name] Kitchen omeprazole (PRILOSEC) 40 MG capsule Take 1 capsule (40 mg total) by mouth daily.  Glory Rosebush DELICA LANCETS 27O MISC   . ONETOUCH VERIO test strip   . oxybutynin (DITROPAN-XL) 10 MG 24 hr tablet Take 10 mg by mouth at bedtime.  . timolol (BETIMOL) 0.5 % ophthalmic solution 1 drop 2 (two) times daily.  . Vitamin D, Ergocalciferol, (DRISDOL) 50000 units CAPS capsule Take 1 capsule (50,000 Units total) by mouth every 7 (seven) days.  . [DISCONTINUED] HYDROcodone-acetaminophen (NORCO) 10-325 MG tablet Take 1 tablet by mouth every 8 (eight) hours as needed.  . [DISCONTINUED] Vitamin D, Ergocalciferol, (DRISDOL) 50000 UNITS CAPS capsule Take 50,000 Units by mouth every 7 (seven) days.    Patient Care Team: Cyndi Bender, PA-C as PCP - General (Physician Assistant)    Objective:   Vitals: BP 128/70 (BP Location: Left Arm, Patient Position: Sitting, Cuff Size: Normal)   Pulse 67   Temp 97.6 F (36.4 C) (Oral)   Resp 16   Ht 5' (1.524 m)   Wt 183 lb 3.2 oz (83.1 kg)   SpO2 96%   BMI 35.78 kg/m   Physical Exam  Constitutional: She is oriented to  person, place, and time. She appears well-developed and well-nourished. No distress.  HENT:  Head: Normocephalic and atraumatic.  Right Ear: Tympanic membrane, external ear and ear canal normal.  Left Ear: Tympanic membrane, external ear and ear canal normal.  Nose: Nose normal.  Mouth/Throat: Uvula is midline, oropharynx is clear and moist and mucous membranes are normal. She has dentures. No oropharyngeal exudate.  Eyes: Conjunctivae and EOM are normal. Pupils are equal, round, and reactive to light. Right eye exhibits no discharge. Left eye exhibits no discharge. No scleral icterus.  Neck: Normal range of motion. Neck supple. No JVD present. No tracheal deviation present. No thyromegaly present.  Cardiovascular: Normal rate, regular rhythm, normal heart sounds and intact distal pulses.  Exam reveals no gallop and no friction rub.   No murmur heard. Pulmonary/Chest: Effort normal and breath sounds normal. No respiratory distress. She has no wheezes.  She has no rales. She exhibits no tenderness.  Abdominal: Soft. Bowel sounds are normal. She exhibits no distension and no mass. There is no tenderness. There is no rebound and no guarding.  Musculoskeletal: Normal range of motion. She exhibits no edema or tenderness.  Lymphadenopathy:    She has no cervical adenopathy.  Neurological: She is alert and oriented to person, place, and time.  Skin: Skin is warm and dry. No rash noted. She is not diaphoretic.  Psychiatric: She has a normal mood and affect. Her behavior is normal. Judgment and thought content normal.  Vitals reviewed.  Diabetic Foot Form - Detailed   Diabetic Foot Exam - detailed Diabetic Foot exam was performed with the following findings:  Yes 11/11/2015 10:00 AM  Is there a history of foot ulcer?:  No Can the patient see the bottom of their feet?:  No Are the shoes appropriate in style and fit?:  Yes Is there swelling or and abnormal foot shape?:  No Are the toenails long?:   No Are the toenails thick?:  Yes Do you have pain in calf while walking?:  No Is there a claw toe deformity?:  Yes (Comment: slight) Is there elevated skin temparature?:  No Is there limited skin dorsiflexion?:  No Is there foot or ankle muscle weakness?:  No Are the toenails ingrown?:  No Normal Range of Motion:  Yes Pulse Foot Exam completed.:  Yes  Right posterior Tibialias:  Present Left posterior Tibialias:  Present  Right Dorsalis Pedis:  Present Left Dorsalis Pedis:  Present  Sensory Foot Exam Completed.:  Yes Swelling:  No Semmes-Weinstein Monofilament Test R Foot Test Control:  Neg L Foot Test Control:  Neg  R Site 1-Great Toe:  Neg L Site 1-Great Toe:  Neg  R Site 4:  Neg L Site 4:  Neg  R Site 5:  Neg L Site 5:  Neg    Comments:  Normal exam     Activities of Daily Living In your present state of health, do you have any difficulty performing the following activities: 11/11/2015  Hearing? N  Vision? N  Difficulty concentrating or making decisions? Y  Walking or climbing stairs? Y  Dressing or bathing? Y  Doing errands, shopping? Y  Some recent data might be hidden    Fall Risk Assessment Fall Risk  11/11/2015  Falls in the past year? No     Depression Screen PHQ 2/9 Scores 11/11/2015 11/11/2015  PHQ - 2 Score 5 5  PHQ- 9 Score 10 -    Cognitive Testing - 6-CIT  Correct? Score   What year is it? yes 0 0 or 4  What month is it? yes 0 0 or 3  Memorize:    Pia Mau,  42,  [place],      What time is it? (within 1 hour) yes 0 0 or 3  Count backwards from 20 yes 0 0, 2, or 4  Name the months of the year yes 0 0, 2, or 4  Repeat name & address above no 8 0, 2, 4, 6, 8, or 10       TOTAL SCORE  8/28   Interpretation:  Abnormal- 8  Normal (0-7) Abnormal (8-28)   Audit-C Alcohol Use Screening  Question Answer Points  How often do you have alcoholic drink? never 0  On days you do drink alcohol, how many drinks do you typically consume? 0 0  How  oftey will you drink  6 or more in a total? never 0  Total Score:  0   A score of 3 or more in women, and 4 or more in men indicates increased risk for alcohol abuse, EXCEPT if all of the points are from question 1.     Assessment & Plan:     Annual Wellness Visit  Reviewed patient's Family Medical History Reviewed and updated list of patient's medical providers Assessment of cognitive impairment was done Assessed patient's functional ability Established a written schedule for health screening Susan Moore Completed and Reviewed  Exercise Activities and Dietary recommendations Goals    None       There is no immunization history on file for this patient.  Health Maintenance  Topic Date Due  . Hepatitis C Screening  Mar 14, 1945  . FOOT EXAM  10/03/1955  . TETANUS/TDAP  10/02/1964  . ZOSTAVAX  10/02/2005  . DEXA SCAN  10/03/2010  . INFLUENZA VACCINE  10/07/2015  . HEMOGLOBIN A1C  12/19/2015  . OPHTHALMOLOGY EXAM  05/13/2016  . MAMMOGRAM  07/16/2016  . COLONOSCOPY  08/20/2025  . PNA vac Low Risk Adult  Completed      Discussed health benefits of physical activity, and encouraged her to engage in regular exercise appropriate for her age and condition.    1. Medicare annual wellness visit, subsequent Normal physical exam.  2. Breast cancer screening Previous mammogram was normal. Mammogram ordered as below. - MM Digital Screening; Future  3. Postmenopausal estrogen deficiency Continue Vit D and calcium. Has not had BMD since 2001. Will repeat as below.  - DG Bone Density; Future - Vitamin D, Ergocalciferol, (DRISDOL) 50000 units CAPS capsule; Take 1 capsule (50,000 Units total) by mouth every 7 (seven) days.  Dispense: 12 capsule; Refill: 1  4. Acute cystitis without hematuria UA positive for leukocytes and glucose (Jardiance). Will treat with Macrobid as below. She is to call if no improvement in symptoms. Urine also sent for culture. Will  adjust antibiotic therapy if needed per C&S results. Advised to stay well hydrated with Jardiance. - nitrofurantoin, macrocrystal-monohydrate, (MACROBID) 100 MG capsule; Take 1 capsule (100 mg total) by mouth 2 (two) times daily.  Dispense: 14 capsule; Refill: 0  5. Dysuria See above.  - POCT urinalysis dipstick - Urine culture  6. Yeast infection Possible mycotic yeast infection from [place]. Will treat with diflucan as below. She is to call if no improvement.  - fluconazole (DIFLUCAN) 150 MG tablet; Take one tablet by mouth; May repeat 2nd tab 72 hours after 1st if needed.  Dispense: 2 tablet; Refill: 0  7. Bilateral low back pain without sciatica No change. Stable. Diagnosis pulled for medication refill. Continue current medical treatment plan. - HYDROcodone-acetaminophen (NORCO) 10-325 MG tablet; Take 1 tablet by mouth every 8 (eight) hours as needed. Please do NOT fill until 11/26/15  Dispense: 90 tablet; Refill: 0  8. 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 She has her 6 month f/u ophthalmology appt on Thursday 11/13/14.   ------------------------------------------------------------------------------------------------------------    Mar Daring, PA-C  [facility]

## 2015-11-11 NOTE — Patient Instructions (Signed)

## 2015-11-13 DIAGNOSIS — H40003 Preglaucoma, unspecified, bilateral: Secondary | ICD-10-CM | POA: Diagnosis not present

## 2015-11-16 LAB — URINE CULTURE

## 2015-11-17 ENCOUNTER — Telehealth: Payer: Self-pay

## 2015-11-17 NOTE — Telephone Encounter (Signed)
Pt advised.   Thanks,   -Petrea Fredenburg  

## 2015-11-17 NOTE — Telephone Encounter (Signed)
-----   Message from Mar Daring, PA-C sent at 11/17/2015  8:22 AM EDT ----- Urine culture positive for e.coli. It is susceptible to macrobid. Continue until completed and call if no symptom improvement.

## 2015-11-26 ENCOUNTER — Telehealth: Payer: Self-pay | Admitting: Physician Assistant

## 2015-11-26 DIAGNOSIS — B3731 Acute candidiasis of vulva and vagina: Secondary | ICD-10-CM

## 2015-11-26 DIAGNOSIS — B373 Candidiasis of vulva and vagina: Secondary | ICD-10-CM

## 2015-11-26 MED ORDER — TERCONAZOLE 0.8 % VA CREA
1.0000 | TOPICAL_CREAM | Freq: Every day | VAGINAL | 0 refills | Status: DC
Start: 2015-11-26 — End: 2015-12-24

## 2015-11-26 NOTE — Telephone Encounter (Signed)
Sent in terconazole cream to [facility]

## 2015-11-26 NOTE — Telephone Encounter (Signed)
Patient advised.

## 2015-11-26 NOTE — Telephone Encounter (Signed)
Pt states he has rec'd a Rx for yeast infection from [facility] on 11/11/15.  Pt states she is still having some itching in her vaginal area.  Pt is requesting something different to help with this.  [place].  [id number]

## 2015-11-27 DIAGNOSIS — Z780 Asymptomatic menopausal state: Secondary | ICD-10-CM | POA: Diagnosis not present

## 2015-11-27 DIAGNOSIS — R29890 Loss of height: Secondary | ICD-10-CM | POA: Diagnosis not present

## 2015-11-27 DIAGNOSIS — Z1231 Encounter for screening mammogram for malignant neoplasm of breast: Secondary | ICD-10-CM | POA: Diagnosis not present

## 2015-11-27 DIAGNOSIS — M810 Age-related osteoporosis without current pathological fracture: Secondary | ICD-10-CM | POA: Diagnosis not present

## 2015-12-02 ENCOUNTER — Telehealth: Payer: Self-pay | Admitting: Physician Assistant

## 2015-12-02 DIAGNOSIS — E1142 Type 2 diabetes mellitus with diabetic polyneuropathy: Secondary | ICD-10-CM

## 2015-12-02 DIAGNOSIS — Z794 Long term (current) use of insulin: Secondary | ICD-10-CM

## 2015-12-02 MED ORDER — EMPAGLIFLOZIN 25 MG PO TABS: 25.0000 mg | ORAL_TABLET | Freq: Every day | ORAL | 5 refills | Status: DC

## 2015-12-02 NOTE — Telephone Encounter (Signed)
Pt is requesting results from a body density and mammogram.  Pt contacted office for refill request on the following medications:  empagliflozin (JARDIANCE) 25 MG TABS tablet.  [place].  [id number]

## 2015-12-02 NOTE — Telephone Encounter (Signed)
I just got mammogram today and it is normal. Have not seen BMD result yet.  Jardiance refilled

## 2015-12-02 NOTE — Telephone Encounter (Signed)
Patient advised as below.  

## 2015-12-03 ENCOUNTER — Encounter: Payer: Self-pay | Admitting: Physician Assistant

## 2015-12-03 ENCOUNTER — Telehealth: Payer: Self-pay | Admitting: Physician Assistant

## 2015-12-03 DIAGNOSIS — E1142 Type 2 diabetes mellitus with diabetic polyneuropathy: Secondary | ICD-10-CM

## 2015-12-03 DIAGNOSIS — Z794 Long term (current) use of insulin: Secondary | ICD-10-CM

## 2015-12-03 MED ORDER — EMPAGLIFLOZIN 25 MG PO TABS: 25.0000 mg | ORAL_TABLET | Freq: Every day | ORAL | 0 refills | Status: DC

## 2015-12-03 NOTE — Telephone Encounter (Signed)
Ok to give samples if we have them. I have given her 1 month supply before

## 2015-12-03 NOTE — Telephone Encounter (Signed)
Please advise refills 

## 2015-12-03 NOTE — Telephone Encounter (Signed)
Patient was notified samples are ready to pick-up.  

## 2015-12-03 NOTE — Telephone Encounter (Signed)
Pt called wanting to know if we have any samples of jardiance?  She said she can not afford to pay for them at the pharmacy.  Her call back is [phone]  Hoyt Koch

## 2015-12-09 ENCOUNTER — Telehealth: Payer: Self-pay | Admitting: Physician Assistant

## 2015-12-09 NOTE — Telephone Encounter (Signed)
Pt is requesting results from bone density test.  [id number]

## 2015-12-09 NOTE — Telephone Encounter (Signed)
Please review.  Thanks,  -Shalika Arntz 

## 2015-12-10 NOTE — Telephone Encounter (Signed)
Can we call and have them send the results. I never received them and the only thing under care everywhere states they sent the results and results can be viewed under canopy but I dont have outside access to this.

## 2015-12-11 NOTE — Telephone Encounter (Signed)
Patient advised that we are waiting for the results to be fax and once we get the results.  Thanks,  -Teige Rountree

## 2015-12-12 NOTE — Telephone Encounter (Signed)
Patient advised of results. Report placed up front to be scan into patient charts. HM updated.  Thanks,  -Joseline

## 2015-12-16 ENCOUNTER — Encounter: Payer: Self-pay | Admitting: Physician Assistant

## 2015-12-23 ENCOUNTER — Ambulatory Visit: Payer: Self-pay | Admitting: Physician Assistant

## 2015-12-24 ENCOUNTER — Encounter: Payer: Self-pay | Admitting: Physician Assistant

## 2015-12-24 ENCOUNTER — Ambulatory Visit (INDEPENDENT_AMBULATORY_CARE_PROVIDER_SITE_OTHER): Payer: Medicare Other | Admitting: Physician Assistant

## 2015-12-24 DIAGNOSIS — M545 Low back pain, unspecified: Secondary | ICD-10-CM

## 2015-12-24 DIAGNOSIS — E1142 Type 2 diabetes mellitus with diabetic polyneuropathy: Secondary | ICD-10-CM

## 2015-12-24 DIAGNOSIS — Z794 Long term (current) use of insulin: Secondary | ICD-10-CM | POA: Diagnosis not present

## 2015-12-24 DIAGNOSIS — N3000 Acute cystitis without hematuria: Secondary | ICD-10-CM | POA: Diagnosis not present

## 2015-12-24 DIAGNOSIS — B373 Candidiasis of vulva and vagina: Secondary | ICD-10-CM

## 2015-12-24 DIAGNOSIS — G8929 Other chronic pain: Secondary | ICD-10-CM

## 2015-12-24 DIAGNOSIS — B3731 Acute candidiasis of vulva and vagina: Secondary | ICD-10-CM

## 2015-12-24 MED ORDER — NITROFURANTOIN MONOHYD MACRO 100 MG PO CAPS: 100.0000 mg | ORAL_CAPSULE | Freq: Two times a day (BID) | ORAL | 0 refills | Status: DC

## 2015-12-24 MED ORDER — TERCONAZOLE 0.8 % VA CREA: 1.0000 | TOPICAL_CREAM | Freq: Every day | VAGINAL | 3 refills | Status: DC

## 2015-12-24 MED ORDER — EMPAGLIFLOZIN 25 MG PO TABS: 25.0000 mg | ORAL_TABLET | Freq: Every day | ORAL | 3 refills | Status: DC

## 2015-12-24 MED ORDER — HYDROCODONE-ACETAMINOPHEN 10-325 MG PO TABS: 1.0000 | ORAL_TABLET | Freq: Three times a day (TID) | ORAL | 0 refills | Status: DC | PRN

## 2015-12-24 NOTE — Patient Instructions (Signed)

## 2015-12-24 NOTE — Progress Notes (Signed)
Patient: Andrea [name] Female    DOB: February 23, 1946   70 y.o.   MRN: [id number] Visit Date: 12/24/2015  Today's Provider: Mar Daring, PA-C   Chief Complaint  Patient presents with  . Urinary Tract Infection   Subjective:    HPI Patient is here with c/o of possible UTI. She denies painful urination or burning. She reports that her back hurts and urine has a bad odor/cloudy. She has had UTI last month and culture came back positive for E. Coli. She reports improving after antibiotic and symptoms just returning over the last week. She also reports increasing vaginal itching similar to yeast infection. This also improved following Terazol cream but returned a couple of weeks after discontinuing.      Allergies  Allergen Reactions  . Metformin And Related Diarrhea  . Sulfa Antibiotics Nausea And Vomiting     Current Outpatient Prescriptions:  .  Alcohol Swabs (ALCOHOL PREP) PADS, Use twice daily prior to SQ injection of insulin to clean skin, Disp: 100 each, Rfl: 6 .  aspirin 81 MG tablet, Take 81 mg by mouth daily., Disp: , Rfl:  .  atorvastatin (LIPITOR) 80 MG tablet, Take 80 mg by mouth daily., Disp: , Rfl:  .  Blood Glucose Calibration (ONETOUCH VERIO) SOLN, , Disp: , Rfl:  .  Blood Glucose Monitoring Suppl (ONETOUCH VERIO IQ SYSTEM) w/Device KIT, , Disp: , Rfl:  .  Cyanocobalamin (B-12) 1000 MCG CAPS, Take 1,500 mcg by mouth., Disp: , Rfl:  .  empagliflozin (JARDIANCE) 25 MG TABS tablet, Take 25 mg by mouth daily., Disp: 28 tablet, Rfl: 0 .  gabapentin (NEURONTIN) 600 MG tablet, Take 600 mg by mouth 3 (three) times daily., Disp: , Rfl:  .  HYDROcodone-acetaminophen (NORCO) 10-325 MG tablet, Take 1 tablet by mouth every 8 (eight) hours as needed. Please do NOT fill until 11/26/15, Disp: 90 tablet, Rfl: 0 .  insulin glargine (LANTUS) 100 UNIT/ML injection, Inject 0.65 mLs (65 Units total) into the skin 2 (two) times daily with a meal., Disp: 10 mL, Rfl: 11 .  Insulin  Syringe-Needle U-100 29G 1 ML MISC, Use to inject 65 units Lantus SQ BIDWC, Disp: 100 each, Rfl: 6 .  lisinopril (PRINIVIL,ZESTRIL) 5 MG tablet, Take 5 mg by mouth daily., Disp: , Rfl:  .  metoprolol succinate (TOPROL-XL) 25 MG 24 hr tablet, , Disp: , Rfl:  .  nitrofurantoin, macrocrystal-monohydrate, (MACROBID) 100 MG capsule, Take 1 capsule (100 mg total) by mouth 2 (two) times daily., Disp: 14 capsule, Rfl: 0 .  Omega-3 Fatty Acids (FISH OIL) 1200 MG CAPS, Take by mouth., Disp: , Rfl:  .  omeprazole (PRILOSEC) 40 MG capsule, Take 1 capsule (40 mg total) by mouth daily., Disp: 90 capsule, Rfl: 3 .  ONETOUCH DELICA LANCETS 29U MISC, , Disp: , Rfl:  .  ONETOUCH VERIO test strip, , Disp: , Rfl:  .  oxybutynin (DITROPAN-XL) 10 MG 24 hr tablet, Take 10 mg by mouth at bedtime., Disp: , Rfl:  .  terconazole (TERAZOL 3) 0.8 % vaginal cream, Place 1 applicator vaginally at bedtime., Disp: 20 g, Rfl: 0 .  timolol (BETIMOL) 0.5 % ophthalmic solution, 1 drop 2 (two) times daily., Disp: , Rfl:  .  Vitamin D, Ergocalciferol, (DRISDOL) 50000 units CAPS capsule, Take 1 capsule (50,000 Units total) by mouth every 7 (seven) days., Disp: 12 capsule, Rfl: 1 .  albuterol (PROVENTIL HFA;VENTOLIN HFA) 108 (90 Base) MCG/ACT inhaler, Inhale 2 puffs  into the lungs every 6 (six) hours as needed for wheezing or shortness of breath. (Patient not taking: Reported on 12/24/2015), Disp: 1 Inhaler, Rfl: 2  Review of Systems  Constitutional: Negative.   Respiratory: Negative.   Cardiovascular: Negative for chest pain, palpitations and leg swelling.  Gastrointestinal: Negative.   Endocrine: Positive for polyuria.  Genitourinary: Positive for frequency and urgency. Negative for dysuria.  Musculoskeletal: Positive for back pain.    Social History  Substance Use Topics  . Smoking status: Former Research scientist (life sciences)  . Smokeless tobacco: Never Used     Comment: Quit 2014  . Alcohol use No   Objective:   BP 112/70 (BP Location: Right  Arm, Patient Position: Sitting, Cuff Size: Normal)   Pulse 83   Temp 97.8 F (36.6 C) (Oral)   Resp 16   Wt 181 lb (82.1 kg)   BMI 35.35 kg/m   Physical Exam  Constitutional: She is oriented to person, place, and time. She appears well-developed and well-nourished. No distress.  Cardiovascular: Normal rate, regular rhythm and normal heart sounds.  Exam reveals no gallop and no friction rub.   No murmur heard. Pulmonary/Chest: Effort normal and breath sounds normal. No respiratory distress. She has no wheezes. She has no rales.  Abdominal: Soft. Normal appearance and bowel sounds are normal. She exhibits no distension and no mass. There is no hepatosplenomegaly. There is tenderness in the suprapubic area. There is no rebound, no guarding and no CVA tenderness.  Neurological: She is alert and oriented to person, place, and time.  Skin: Skin is warm and dry. She is not diaphoretic.  Vitals reviewed.     Assessment & Plan:     1. Vaginal yeast infection Will give her terazol cream for vaginal yeast infection. She is to use daily until symptoms resolve, then use M-W-F afterwards.  - terconazole (TERAZOL 3) 0.8 % vaginal cream; Place 1 applicator vaginally at bedtime.  Dispense: 20 g; Refill: 3  2. Acute cystitis without hematuria Unable to void today. Will treat based off previous culture results. She is to call if symptoms do not improve. - nitrofurantoin, macrocrystal-monohydrate, (MACROBID) 100 MG capsule; Take 1 capsule (100 mg total) by mouth 2 (two) times daily.  Dispense: 20 capsule; Refill: 0  3. Type 2 diabetes mellitus with diabetic polyneuropathy, with long-term current use of insulin ([place]) Jardiance prescription printed for financial assistance through pharmaceutical company. - empagliflozin (JARDIANCE) 25 MG TABS tablet; Take 25 mg by mouth daily.  Dispense: 90 tablet; Refill: 3  4. Chronic bilateral low back pain without sciatica Stable. Diagnosis pulled for medication  refill. Continue current medical treatment plan. - HYDROcodone-acetaminophen (NORCO) 10-325 MG tablet; Take 1 tablet by mouth every 8 (eight) hours as needed. Please do NOT fill until 12/26/15  Dispense: 90 tablet; Refill: 0       Mar Daring, PA-C  [facility]

## 2016-01-06 ENCOUNTER — Other Ambulatory Visit: Payer: Self-pay

## 2016-01-06 DIAGNOSIS — Z780 Asymptomatic menopausal state: Secondary | ICD-10-CM

## 2016-01-06 MED ORDER — VITAMIN D (ERGOCALCIFEROL) 1.25 MG (50000 UNIT) PO CAPS: 50000.0000 [IU] | ORAL_CAPSULE | ORAL | 1 refills | Status: DC

## 2016-01-06 NOTE — Telephone Encounter (Signed)
Refill request for Vitamin D 50000IU CAP.  Thanks,  -Taliyah Watrous

## 2016-01-09 ENCOUNTER — Telehealth: Payer: Self-pay | Admitting: Physician Assistant

## 2016-01-09 NOTE — Telephone Encounter (Signed)
Samples are available in closet please advise. KW

## 2016-01-09 NOTE — Telephone Encounter (Signed)
Ok to give samples. May give month supply if we have enough.

## 2016-01-09 NOTE — Telephone Encounter (Signed)
Advised patient that samples have been left at the front desk

## 2016-01-09 NOTE — Telephone Encounter (Signed)
Pt called wanting to get samples of Jardiance 25 mg.  She is going to her eye appt.  [name] on Monday and would like to stop by then and pick them up  Her call back is [phone].  Thanks, Con Memos

## 2016-01-22 ENCOUNTER — Telehealth: Payer: Self-pay | Admitting: Physician Assistant

## 2016-01-22 DIAGNOSIS — M545 Low back pain, unspecified: Secondary | ICD-10-CM

## 2016-01-22 DIAGNOSIS — G8929 Other chronic pain: Secondary | ICD-10-CM

## 2016-01-22 MED ORDER — HYDROCODONE-ACETAMINOPHEN 10-325 MG PO TABS: 1.0000 | ORAL_TABLET | Freq: Three times a day (TID) | ORAL | 0 refills | Status: DC | PRN

## 2016-01-22 NOTE — Telephone Encounter (Signed)
Please review. KW 

## 2016-01-22 NOTE — Telephone Encounter (Signed)
Patient has been advised. KW 

## 2016-01-22 NOTE — Telephone Encounter (Signed)
Pt needs refill  HYDROcodone-acetaminophen (NORCO) 10-325 MG tablet  She does not need to get it filled until next week but she has a ride today.  Please call when it is ready for pick up.  Thanks Con Memos

## 2016-01-22 NOTE — Telephone Encounter (Signed)
Rx printed and available for pick up

## 2016-01-30 ENCOUNTER — Telehealth: Payer: Self-pay | Admitting: Physician Assistant

## 2016-01-30 DIAGNOSIS — B3731 Acute candidiasis of vulva and vagina: Secondary | ICD-10-CM

## 2016-01-30 DIAGNOSIS — B373 Candidiasis of vulva and vagina: Secondary | ICD-10-CM

## 2016-01-30 DIAGNOSIS — N3000 Acute cystitis without hematuria: Secondary | ICD-10-CM

## 2016-01-30 MED ORDER — NITROFURANTOIN MONOHYD MACRO 100 MG PO CAPS: 100.0000 mg | ORAL_CAPSULE | Freq: Two times a day (BID) | ORAL | 0 refills | Status: DC

## 2016-01-30 MED ORDER — FLUCONAZOLE 150 MG PO TABS: 150.0000 mg | ORAL_TABLET | Freq: Once | ORAL | 0 refills | Status: AC

## 2016-01-30 NOTE — Telephone Encounter (Signed)
Sent in [place] and diflucan for UTI and yeast

## 2016-01-30 NOTE — Telephone Encounter (Signed)
Pt is wanting a refill on the antibiotic she took about a month ago for a UTI.  She is having symptoms again.  She does not want to come in.  She uses [facility] in [place].  Call back is [phone]  Thanks teri

## 2016-01-30 NOTE — Telephone Encounter (Signed)
Patient advised as directed below.  Thanks,  -Petrona Wyeth 

## 2016-01-30 NOTE — Telephone Encounter (Signed)
Please review.  Thanks,  -Joseline 

## 2016-02-10 ENCOUNTER — Ambulatory Visit: Payer: Medicare Other | Admitting: Physician Assistant

## 2016-02-12 ENCOUNTER — Encounter: Payer: Self-pay | Admitting: Physician Assistant

## 2016-02-12 ENCOUNTER — Ambulatory Visit (INDEPENDENT_AMBULATORY_CARE_PROVIDER_SITE_OTHER): Payer: Medicare Other | Admitting: Physician Assistant

## 2016-02-12 VITALS — BP 120/50 | HR 84 | Temp 97.9°F | Resp 15 | Wt 177.8 lb

## 2016-02-12 DIAGNOSIS — G8929 Other chronic pain: Secondary | ICD-10-CM | POA: Diagnosis not present

## 2016-02-12 DIAGNOSIS — E113299 Type 2 diabetes mellitus with mild nonproliferative diabetic retinopathy without macular edema, unspecified eye: Secondary | ICD-10-CM | POA: Diagnosis not present

## 2016-02-12 DIAGNOSIS — Z23 Encounter for immunization: Secondary | ICD-10-CM

## 2016-02-12 DIAGNOSIS — F331 Major depressive disorder, recurrent, moderate: Secondary | ICD-10-CM

## 2016-02-12 DIAGNOSIS — M545 Low back pain, unspecified: Secondary | ICD-10-CM

## 2016-02-12 LAB — POCT GLYCOSYLATED HEMOGLOBIN (HGB A1C)
ESTIMATED AVERAGE GLUCOSE: 235
Hemoglobin A1C: 9.8

## 2016-02-12 MED ORDER — SERTRALINE HCL 25 MG PO TABS: 25.0000 mg | ORAL_TABLET | Freq: Every day | ORAL | 0 refills | Status: DC

## 2016-02-12 MED ORDER — HYDROCODONE-ACETAMINOPHEN 10-325 MG PO TABS: 1.0000 | ORAL_TABLET | Freq: Three times a day (TID) | ORAL | 0 refills | Status: DC | PRN

## 2016-02-12 NOTE — Patient Instructions (Signed)
Major Depressive Disorder, Adult Major depressive disorder (MDD) is a mental health condition. MDD often makes you feel sad, hopeless, or helpless. MDD can also cause symptoms in your body. MDD can affect your:  Work.  School.  Relationships.  Other normal activities. MDD can range from mild to very bad. It may occur once (single episode MDD). It can also occur many times (recurrent MDD). The main symptoms of MDD often include:  Feeling sad, depressed, or irritable most of the time.  Loss of interest. MDD symptoms also include:  Sleeping too much or too little.  Eating too much or too little.  A change in your weight.  Feeling tired (fatigue) or having low energy.  Feeling worthless.  Feeling guilty.  Trouble making decisions.  Trouble thinking clearly.  Thoughts of suicide or harming others.  Feeling weak.  Feeling agitated.  Keeping yourself from being around other people (isolation). Follow these instructions at home: Activity  Do these things as told by your doctor:  Go back to your normal activities.  Exercise regularly.  Spend time outdoors. Alcohol  Talk with your doctor about how alcohol can affect your antidepressant medicines.  Do not drink alcohol. Or, limit how much alcohol you drink.  This means no more than 1 drink a day for nonpregnant women and 2 drinks a day for men. One drink equals one of these:  12 oz of beer.  5 oz of wine.  1 oz of hard liquor. General instructions  Take over-the-counter and prescription medicines only as told by your doctor.  Eat a healthy diet.  Get plenty of sleep.  Find activities that you enjoy. Make time to do them.  Think about joining a support group. Your doctor may be able to suggest a group for you.  Keep all follow-up visits as told by your doctor. This is important. Where to find more information:  [name] on Mental Illness:  www.nami.org  U.S. National Institute of Mental  Health:  https://carter.com/  National Suicide Prevention Lifeline:  445 458 8723. This is free, 24-hour help. Contact a doctor if:  Your symptoms get worse.  You have new symptoms. Get help right away if:  You self-harm.  You see, hear, taste, smell, or feel things that are not present (hallucinate). If you ever feel like you may hurt yourself or others, or have thoughts about taking your own life, get help right away. You can go to your nearest emergency department or call:  Your local emergency services (911 in the [place]  A suicide crisis helpline, such as the [place]:  5716217630. This is open 24 hours a day. This information is not intended to replace advice given to you by your health care provider. Make sure you discuss any questions you have with your health care provider. Document Released: 02/03/2015 Document Revised: 11/09/2015 Document Reviewed: 11/09/2015 Elsevier Interactive Patient Education  2017 Elsevier Inc. Sertraline tablets What is this medicine? SERTRALINE (SER tra leen) is used to treat depression. It may also be used to treat obsessive compulsive disorder, panic disorder, post-trauma stress, premenstrual dysphoric disorder (PMDD) or social anxiety. This medicine may be used for other purposes; ask your health care provider or pharmacist if you have questions. COMMON BRAND NAME(S): Zoloft What should I tell my health care provider before I take this medicine? They need to know if you have any of these conditions: -bleeding disorders -bipolar disorder or a family history of bipolar disorder -glaucoma -heart disease -high blood pressure -history  of irregular heartbeat -history of low levels of calcium, magnesium, or potassium in the blood -if you often drink alcohol -liver disease -receiving electroconvulsive therapy -seizures -suicidal thoughts, plans, or attempt; a previous suicide attempt by you or a family  member -take medicines that treat or prevent blood clots -thyroid disease -an unusual or allergic reaction to sertraline, other medicines, foods, dyes, or preservatives -pregnant or trying to get pregnant -breast-feeding How should I use this medicine? Take this medicine by mouth with a glass of water. Follow the directions on the prescription label. You can take it with or without food. Take your medicine at regular intervals. Do not take your medicine more often than directed. Do not stop taking this medicine suddenly except upon the advice of your doctor. Stopping this medicine too quickly may cause serious side effects or your condition may worsen. A special MedGuide will be given to you by the pharmacist with each prescription and refill. Be sure to read this information carefully each time. Talk to your pediatrician regarding the use of this medicine in children. While this drug may be prescribed for children as young as 7 years for selected conditions, precautions do apply. Overdosage: If you think you have taken too much of this medicine contact a poison control center or emergency room at once. NOTE: This medicine is only for you. Do not share this medicine with others. What if I miss a dose? If you miss a dose, take it as soon as you can. If it is almost time for your next dose, take only that dose. Do not take double or extra doses. What may interact with this medicine? Do not take this medicine with any of the following medications: -certain medicines for fungal infections like fluconazole, itraconazole, ketoconazole, posaconazole, voriconazole -cisapride -disulfiram -dofetilide -linezolid -MAOIs like Carbex, Eldepryl, Marplan, Nardil, and Parnate -metronidazole -methylene blue (injected into a vein) -pimozide -thioridazine -ziprasidone This medicine may also interact with the following medications: -alcohol -amphetamines -aspirin and aspirin-like medicines -certain  medicines for depression, anxiety, or psychotic disturbances -certain medicines for irregular heart beat like flecainide, propafenone -certain medicines for migraine headaches like almotriptan, eletriptan, frovatriptan, naratriptan, rizatriptan, sumatriptan, zolmitriptan -certain medicines for sleep -certain medicines for seizures like carbamazepine, valproic acid, phenytoin -certain medicines that treat or prevent blood clots like warfarin, enoxaparin, dalteparin -cimetidine -digoxin -diuretics -fentanyl -furazolidone -isoniazid -lithium -NSAIDs, medicines for pain and inflammation, like ibuprofen or naproxen -other medicines that prolong the QT interval (cause an abnormal heart rhythm) -procarbazine -rasagiline -supplements like St. John's wort, kava kava, valerian -tolbutamide -tramadol -tryptophan This list may not describe all possible interactions. Give your health care provider a list of all the medicines, herbs, non-prescription drugs, or dietary supplements you use. Also tell them if you smoke, drink alcohol, or use illegal drugs. Some items may interact with your medicine. What should I watch for while using this medicine? Tell your doctor if your symptoms do not get better or if they get worse. Visit your doctor or health care professional for regular checks on your progress. Because it may take several weeks to see the full effects of this medicine, it is important to continue your treatment as prescribed by your doctor. Patients and their families should watch out for new or worsening thoughts of suicide or depression. Also watch out for sudden changes in feelings such as feeling anxious, agitated, panicky, irritable, hostile, aggressive, impulsive, severely restless, overly excited and hyperactive, or not being able to sleep. If this happens, especially at  the beginning of treatment or after a change in dose, call your health care professional. Dennis Bast may get drowsy or dizzy. Do  not drive, use machinery, or do anything that needs mental alertness until you know how this medicine affects you. Do not stand or sit up quickly, especially if you are an older patient. This reduces the risk of dizzy or fainting spells. Alcohol may interfere with the effect of this medicine. Avoid alcoholic drinks. Your mouth may get dry. Chewing sugarless gum or sucking hard candy, and drinking plenty of water may help. Contact your doctor if the problem does not go away or is severe. What side effects may I notice from receiving this medicine? Side effects that you should report to your doctor or health care professional as soon as possible: -allergic reactions like skin rash, itching or hives, swelling of the face, lips, or tongue -anxious -black, tarry stools -changes in vision -confusion -elevated mood, decreased need for sleep, racing thoughts, impulsive behavior -eye pain -fast, irregular heartbeat -feeling faint or lightheaded, falls -feeling agitated, angry, or irritable -hallucination, loss of contact with reality -loss of balance or coordination -loss of memory -painful or prolonged erections -restlessness, pacing, inability to keep still -seizures -stiff muscles -suicidal thoughts or other mood changes -trouble sleeping -unusual bleeding or bruising -unusually weak or tired -vomiting Side effects that usually do not require medical attention (report to your doctor or health care professional if they continue or are bothersome): -change in appetite or weight -change in sex drive or performance -diarrhea -increased sweating -indigestion, nausea -tremors This list may not describe all possible side effects. Call your doctor for medical advice about side effects. You may report side effects to FDA at 1-800-FDA-1088. Where should I keep my medicine? Keep out of the reach of children. Store at room temperature between 15 and 30 degrees C (59 and 86 degrees F). Throw away  any unused medicine after the expiration date. NOTE: This sheet is a summary. It may not cover all possible information. If you have questions about this medicine, talk to your doctor, pharmacist, or health care provider.  2017 Elsevier/Gold Standard (2015-07-26 15:54:02)

## 2016-02-12 NOTE — Progress Notes (Signed)
Patient: Andrea [name] Female    DOB: Jan 31, 1946   70 y.o.   MRN: [id number] Visit Date: 02/12/2016  Today's Provider: Mar Daring, PA-C   No chief complaint on file.  Subjective:    HPI  Diabetes Mellitus Type II, Follow-up:   Lab Results  Component Value Date   HGBA1C 9.8 02/12/2016   HGBA1C 9.0 09/18/2015   HGBA1C 9.0 (H) 09/02/2015    Last seen for diabetes 3 months ago.  Management since then includes none. She reports excellent compliance with treatment. She is not having side effects.  Current symptoms include none and have been stable. Sugars levels at home after a cup of coffee is between 120's-140's.  Most Recent Eye Exam: 11/13/2014 Weight trend: decreasing steadily Current diet: in general, an "unhealthy" diet Current exercise: none Upon further discussion with patient she does admit that she is not taking her 65 units of insulin at bedtime. She states that she sometimes forgets to take this dose.  Pertinent Labs:    Component Value Date/Time   CHOL 142 09/02/2015 1214   TRIG 134 09/02/2015 1214   HDL 67 09/02/2015 1214   LDLCALC 48 09/02/2015 1214   CREATININE 0.95 09/02/2015 1214   CREATININE 1.02 01/01/2014 0643    Wt Readings from Last 3 Encounters:  02/12/16 177 lb 12.8 oz (80.6 kg)  12/24/15 181 lb (82.1 kg)  11/11/15 183 lb 3.2 oz (83.1 kg)    ------------------------------------------------------------------------ She reports that lately she is crying for no reason, irritability and poor appetite.     Allergies  Allergen Reactions  . Metformin And Related Diarrhea  . Sulfa Antibiotics Nausea And Vomiting     Current Outpatient Prescriptions:  .  albuterol (PROVENTIL HFA;VENTOLIN HFA) 108 (90 Base) MCG/ACT inhaler, Inhale 2 puffs into the lungs every 6 (six) hours as needed for wheezing or shortness of breath., Disp: 1 Inhaler, Rfl: 2 .  Alcohol Swabs (ALCOHOL PREP) PADS, Use twice daily prior to SQ injection of  insulin to clean skin, Disp: 100 each, Rfl: 6 .  aspirin 81 MG tablet, Take 81 mg by mouth daily., Disp: , Rfl:  .  atorvastatin (LIPITOR) 80 MG tablet, Take 80 mg by mouth daily., Disp: , Rfl:  .  Blood Glucose Calibration (ONETOUCH VERIO) SOLN, , Disp: , Rfl:  .  Blood Glucose Monitoring Suppl (ONETOUCH VERIO IQ SYSTEM) w/Device KIT, , Disp: , Rfl:  .  Cyanocobalamin (B-12) 1000 MCG CAPS, Take 1,500 mcg by mouth., Disp: , Rfl:  .  empagliflozin (JARDIANCE) 25 MG TABS tablet, Take 25 mg by mouth daily., Disp: 90 tablet, Rfl: 3 .  gabapentin (NEURONTIN) 600 MG tablet, Take 600 mg by mouth 3 (three) times daily., Disp: , Rfl:  .  HYDROcodone-acetaminophen (NORCO) 10-325 MG tablet, Take 1 tablet by mouth every 8 (eight) hours as needed. Please do NOT fill until 01/26/16, Disp: 90 tablet, Rfl: 0 .  insulin glargine (LANTUS) 100 UNIT/ML injection, Inject 0.65 mLs (65 Units total) into the skin 2 (two) times daily with a meal., Disp: 10 mL, Rfl: 11 .  Insulin Syringe-Needle U-100 29G 1 ML MISC, Use to inject 65 units Lantus SQ BIDWC, Disp: 100 each, Rfl: 6 .  lisinopril (PRINIVIL,ZESTRIL) 5 MG tablet, Take 5 mg by mouth daily., Disp: , Rfl:  .  metoprolol succinate (TOPROL-XL) 25 MG 24 hr tablet, , Disp: , Rfl:  .  nitrofurantoin, macrocrystal-monohydrate, (MACROBID) 100 MG capsule, Take 1 capsule (100 mg  total) by mouth 2 (two) times daily., Disp: 20 capsule, Rfl: 0 .  Omega-3 Fatty Acids (FISH OIL) 1200 MG CAPS, Take by mouth., Disp: , Rfl:  .  omeprazole (PRILOSEC) 40 MG capsule, Take 1 capsule (40 mg total) by mouth daily., Disp: 90 capsule, Rfl: 3 .  ONETOUCH DELICA LANCETS 05L MISC, , Disp: , Rfl:  .  ONETOUCH VERIO test strip, , Disp: , Rfl:  .  oxybutynin (DITROPAN-XL) 10 MG 24 hr tablet, Take 10 mg by mouth at bedtime., Disp: , Rfl:  .  terconazole (TERAZOL 3) 0.8 % vaginal cream, Place 1 applicator vaginally at bedtime., Disp: 20 g, Rfl: 3 .  Vitamin D, Ergocalciferol, (DRISDOL) 50000 units  CAPS capsule, Take 1 capsule (50,000 Units total) by mouth every 7 (seven) days., Disp: 12 capsule, Rfl: 1  Review of Systems  Constitutional: Positive for appetite change and fatigue.       Crying and Irritability  Respiratory: Positive for shortness of breath.   Cardiovascular: Negative for chest pain, palpitations and leg swelling.  Endocrine: Positive for polydipsia.    Social History  Substance Use Topics  . Smoking status: Former Research scientist (life sciences)  . Smokeless tobacco: Never Used     Comment: Quit 2014  . Alcohol use No   Objective:   BP (!) 120/50 (BP Location: Right Arm, Patient Position: Sitting, Cuff Size: Normal)   Pulse 84   Temp 97.9 F (36.6 C) (Oral)   Resp 15   Wt 177 lb 12.8 oz (80.6 kg)   BMI 34.72 kg/m   Physical Exam  Constitutional: She appears well-developed and well-nourished. No distress.  Neck: Normal range of motion. Neck supple. No JVD present. No tracheal deviation present. No thyromegaly present.  Cardiovascular: Normal rate, regular rhythm and normal heart sounds.  Exam reveals no gallop and no friction rub.   No murmur heard. Pulmonary/Chest: Effort normal and breath sounds normal. No respiratory distress. She has no wheezes. She has no rales.  Lymphadenopathy:    She has no cervical adenopathy.  Skin: She is not diaphoretic.  Psychiatric: Her speech is normal and behavior is normal. Judgment and thought content normal. Her mood appears anxious. Cognition and memory are normal. She exhibits a depressed mood.  Vitals reviewed.     Assessment & Plan:     1. 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 A1c is increased 9.8. Patient states that she is not taking her nightly dose of the 65 units insulin. Advised patient that she needs to start being more compliant with medications. She voices understanding. Her daughter  states that she is also point to start trying to make sure she is taking the nighttime dose as well. She is to focus on trying to take this nighttime dose over the next 4 weeks and I will see her back and we will recheck her A1c at that time. - POCT glycosylated hemoglobin (Hb A1C)  2. Need for influenza vaccination Flu vaccine given today without complication. Patient sat upright for 15 minutes to check for adverse reaction before being released. - Flu vaccine HIGH DOSE PF  3. Chronic bilateral low back pain without sciatica Stable. Diagnosis pulled for medication refill. Continue current medical treatment plan. - HYDROcodone-acetaminophen (NORCO) 10-325 MG tablet; Take 1 tablet by mouth every 8 (eight) hours as needed. Please do NOT fill until 01/26/16  Dispense: 90 tablet; Refill: 0  4. Moderate episode  of recurrent major depressive disorder (HCC) Worsening symptoms. Will start low-dose sertraline as below. I will see her back in 4 weeks to see how she is doing with this medication and to see if there are any improvements. - sertraline (ZOLOFT) 25 MG tablet; Take 1 tablet (25 mg total) by mouth daily.  Dispense: 30 tablet; Refill: 0       Mar Daring, PA-C  [facility]

## 2016-02-16 ENCOUNTER — Other Ambulatory Visit: Payer: Self-pay

## 2016-02-16 DIAGNOSIS — Z794 Long term (current) use of insulin: Secondary | ICD-10-CM

## 2016-02-16 DIAGNOSIS — E1142 Type 2 diabetes mellitus with diabetic polyneuropathy: Secondary | ICD-10-CM

## 2016-02-16 MED ORDER — INSULIN GLARGINE 100 UNIT/ML ~~LOC~~ SOLN: 65.0000 [IU] | Freq: Two times a day (BID) | SUBCUTANEOUS | 11 refills | Status: DC

## 2016-02-25 ENCOUNTER — Telehealth: Payer: Self-pay | Admitting: Physician Assistant

## 2016-02-25 NOTE — Telephone Encounter (Signed)
Patient advised that Tawanna Sat said is ok for patient to take 2 Sertraline on Christmas Day. Patient is aware that it will make her sleepy.  Thanks,  -Mulan Adan

## 2016-02-25 NOTE — Telephone Encounter (Signed)
Pt is asking if she can take 2 sertraline (ZOLOFT) 25 MG tablet on Christmas Day so she will stay calm due to a lot of people being around her.  [id number]

## 2016-03-02 ENCOUNTER — Other Ambulatory Visit: Payer: Self-pay

## 2016-03-02 DIAGNOSIS — Z780 Asymptomatic menopausal state: Secondary | ICD-10-CM

## 2016-03-02 NOTE — Telephone Encounter (Signed)
Josie there isn't any medications attached here.

## 2016-03-03 MED ORDER — VITAMIN D (ERGOCALCIFEROL) 1.25 MG (50000 UNIT) PO CAPS: 50000.0000 [IU] | ORAL_CAPSULE | ORAL | 1 refills | Status: DC

## 2016-03-11 ENCOUNTER — Ambulatory Visit (INDEPENDENT_AMBULATORY_CARE_PROVIDER_SITE_OTHER): Payer: Medicare Other | Admitting: Physician Assistant

## 2016-03-11 ENCOUNTER — Encounter: Payer: Self-pay | Admitting: Physician Assistant

## 2016-03-11 DIAGNOSIS — M545 Low back pain, unspecified: Secondary | ICD-10-CM

## 2016-03-11 DIAGNOSIS — G8929 Other chronic pain: Secondary | ICD-10-CM

## 2016-03-11 DIAGNOSIS — F331 Major depressive disorder, recurrent, moderate: Secondary | ICD-10-CM | POA: Diagnosis not present

## 2016-03-11 MED ORDER — HYDROCODONE-ACETAMINOPHEN 10-325 MG PO TABS: 1.0000 | ORAL_TABLET | Freq: Three times a day (TID) | ORAL | 0 refills | Status: DC | PRN

## 2016-03-11 MED ORDER — SERTRALINE HCL 25 MG PO TABS: 25.0000 mg | ORAL_TABLET | Freq: Every day | ORAL | 3 refills | Status: DC

## 2016-03-11 NOTE — Patient Instructions (Signed)

## 2016-03-11 NOTE — Progress Notes (Signed)
Patient: Andrea [name] Female    DOB: May 01, 1945   71 y.o.   MRN: [id number] Visit Date: 03/11/2016  Today's Provider: Mar Daring, PA-C   Chief Complaint  Patient presents with  . Follow-up    Anxiety   Subjective:    Anxiety  Presents for follow-up visit. Symptoms include confusion, decreased concentration (patient reports is a little better), excessive worry and irritability. Patient reports no hyperventilation, nervous/anxious behavior, palpitations, panic, shortness of breath or suicidal ideas. Episode frequency: Irritability depends on the situation. She reports that it happens when she is around a lot of people. The severity of symptoms is moderate. The quality of sleep is fair.        Allergies  Allergen Reactions  . Metformin And Related Diarrhea  . Sulfa Antibiotics Nausea And Vomiting     Current Outpatient Prescriptions:  .  albuterol (PROVENTIL HFA;VENTOLIN HFA) 108 (90 Base) MCG/ACT inhaler, Inhale 2 puffs into the lungs every 6 (six) hours as needed for wheezing or shortness of breath., Disp: 1 Inhaler, Rfl: 2 .  Alcohol Swabs (ALCOHOL PREP) PADS, Use twice daily prior to SQ injection of insulin to clean skin, Disp: 100 each, Rfl: 6 .  aspirin 81 MG tablet, Take 81 mg by mouth daily., Disp: , Rfl:  .  atorvastatin (LIPITOR) 80 MG tablet, Take 80 mg by mouth daily., Disp: , Rfl:  .  Blood Glucose Calibration (ONETOUCH VERIO) SOLN, , Disp: , Rfl:  .  Blood Glucose Monitoring Suppl (ONETOUCH VERIO IQ SYSTEM) w/Device KIT, , Disp: , Rfl:  .  Cyanocobalamin (B-12) 1000 MCG CAPS, Take 1,500 mcg by mouth., Disp: , Rfl:  .  empagliflozin (JARDIANCE) 25 MG TABS tablet, Take 25 mg by mouth daily., Disp: 90 tablet, Rfl: 3 .  gabapentin (NEURONTIN) 600 MG tablet, Take 600 mg by mouth 3 (three) times daily., Disp: , Rfl:  .  HYDROcodone-acetaminophen (NORCO) 10-325 MG tablet, Take 1 tablet by mouth every 8 (eight) hours as needed. Please do NOT fill until  01/26/16, Disp: 90 tablet, Rfl: 0 .  insulin glargine (LANTUS) 100 UNIT/ML injection, Inject 0.65 mLs (65 Units total) into the skin 2 (two) times daily with a meal., Disp: 10 mL, Rfl: 11 .  Insulin Syringe-Needle U-100 29G 1 ML MISC, Use to inject 65 units Lantus SQ BIDWC, Disp: 100 each, Rfl: 6 .  lisinopril (PRINIVIL,ZESTRIL) 5 MG tablet, Take 5 mg by mouth daily., Disp: , Rfl:  .  metoprolol succinate (TOPROL-XL) 25 MG 24 hr tablet, , Disp: , Rfl:  .  Omega-3 Fatty Acids (FISH OIL) 1200 MG CAPS, Take by mouth., Disp: , Rfl:  .  omeprazole (PRILOSEC) 40 MG capsule, Take 1 capsule (40 mg total) by mouth daily., Disp: 90 capsule, Rfl: 3 .  ONETOUCH DELICA LANCETS 33I MISC, , Disp: , Rfl:  .  ONETOUCH VERIO test strip, , Disp: , Rfl:  .  oxybutynin (DITROPAN-XL) 10 MG 24 hr tablet, Take 10 mg by mouth at bedtime., Disp: , Rfl:  .  sertraline (ZOLOFT) 25 MG tablet, Take 1 tablet (25 mg total) by mouth daily., Disp: 30 tablet, Rfl: 0 .  terconazole (TERAZOL 3) 0.8 % vaginal cream, Place 1 applicator vaginally at bedtime., Disp: 20 g, Rfl: 3 .  Vitamin D, Ergocalciferol, (DRISDOL) 50000 units CAPS capsule, Take 1 capsule (50,000 Units total) by mouth every 7 (seven) days., Disp: 12 capsule, Rfl: 1  Review of Systems  Constitutional: Positive for irritability.  Respiratory: Negative for shortness of breath.   Cardiovascular: Negative for palpitations.  Psychiatric/Behavioral: Positive for confusion and decreased concentration (patient reports is a little better). Negative for suicidal ideas. The patient is not nervous/anxious.     Social History  Substance Use Topics  . Smoking status: Former Research scientist (life sciences)  . Smokeless tobacco: Never Used     Comment: Quit 2014  . Alcohol use No   Objective:   BP 120/60 (BP Location: Right Arm, Patient Position: Sitting, Cuff Size: Large)   Pulse 82   Temp 98.1 F (36.7 C) (Oral)   Resp 16   Wt 178 lb 9.6 oz (81 kg)   BMI 34.88 kg/m   Physical Exam    Constitutional: She appears well-developed and well-nourished. No distress.  Neck: Normal range of motion. Neck supple. No tracheal deviation present. No thyromegaly present.  Cardiovascular: Normal rate, regular rhythm and normal heart sounds.  Exam reveals no gallop and no friction rub.   No murmur heard. Pulmonary/Chest: Effort normal and breath sounds normal. No respiratory distress. She has no wheezes. She has no rales.  Lymphadenopathy:    She has no cervical adenopathy.  Skin: She is not diaphoretic.  Vitals reviewed.      Assessment & Plan:     1. Moderate episode of recurrent major depressive disorder (HCC) Improving. Will continue current dose of sertraline 64m as she feels this is helping enough. I will see her back in 2 months for T2DM f/u/. - sertraline (ZOLOFT) 25 MG tablet; Take 1 tablet (25 mg total) by mouth daily.  Dispense: 30 tablet; Refill: 3  2. Chronic bilateral low back pain without sciatica Stable. Diagnosis pulled for medication refill. Continue current medical treatment plan. - HYDROcodone-acetaminophen (NORCO) 10-325 MG tablet; Take 1 tablet by mouth every 8 (eight) hours as needed. Please do NOT fill until 03/27/16  Dispense: 90 tablet; Refill: 0       JMar Daring PA-C  [email or web address]

## 2016-03-31 ENCOUNTER — Telehealth: Payer: Self-pay

## 2016-03-31 DIAGNOSIS — Z8673 Personal history of transient ischemic attack (TIA), and cerebral infarction without residual deficits: Secondary | ICD-10-CM

## 2016-03-31 DIAGNOSIS — I4510 Unspecified right bundle-branch block: Secondary | ICD-10-CM

## 2016-03-31 DIAGNOSIS — I5022 Chronic systolic (congestive) heart failure: Secondary | ICD-10-CM

## 2016-03-31 DIAGNOSIS — I252 Old myocardial infarction: Secondary | ICD-10-CM

## 2016-03-31 NOTE — Telephone Encounter (Signed)
It looks like I did not place referral and I apologize and am glad she called to check. Order is now placed.

## 2016-03-31 NOTE — Telephone Encounter (Signed)
Patient is calling to check the status of a referral for a different cardiologist. Patient states she discussed with Tawanna Sat at [facility] on 03/11/2016. CB# [phone]

## 2016-04-21 ENCOUNTER — Telehealth: Payer: Self-pay

## 2016-04-21 DIAGNOSIS — M545 Low back pain, unspecified: Secondary | ICD-10-CM

## 2016-04-21 DIAGNOSIS — G8929 Other chronic pain: Secondary | ICD-10-CM

## 2016-04-21 MED ORDER — HYDROCODONE-ACETAMINOPHEN 10-325 MG PO TABS: 1.0000 | ORAL_TABLET | Freq: Three times a day (TID) | ORAL | 0 refills | Status: DC | PRN

## 2016-04-21 NOTE — Telephone Encounter (Signed)
Rx printed for patient. [place] Substance abuse registry checked. Patient is only having filled by me, has not filled early and has not pharmacy shopped.

## 2016-04-21 NOTE — Telephone Encounter (Signed)
Please review. Thanks!  

## 2016-04-21 NOTE — Telephone Encounter (Signed)
Patient is requesting to pick up RX for HYDROcodone-acetaminophen (NORCO) 10-325 MG tablet tomorrow. 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 Network engineer and will be in [place]. She said she will not fill RX until the 20th when its due. CB#[phone]

## 2016-04-22 ENCOUNTER — Ambulatory Visit: Payer: Medicare Other | Admitting: Cardiology

## 2016-04-29 ENCOUNTER — Encounter (INDEPENDENT_AMBULATORY_CARE_PROVIDER_SITE_OTHER): Payer: Self-pay

## 2016-04-29 ENCOUNTER — Telehealth: Payer: Self-pay | Admitting: Physician Assistant

## 2016-04-29 ENCOUNTER — Encounter: Payer: Self-pay | Admitting: Cardiology

## 2016-04-29 ENCOUNTER — Ambulatory Visit (INDEPENDENT_AMBULATORY_CARE_PROVIDER_SITE_OTHER): Payer: Medicare Other | Admitting: Cardiology

## 2016-04-29 VITALS — BP 108/50 | HR 63 | Ht 60.0 in | Wt 174.8 lb

## 2016-04-29 DIAGNOSIS — I2510 Atherosclerotic heart disease of native coronary artery without angina pectoris: Secondary | ICD-10-CM | POA: Diagnosis not present

## 2016-04-29 DIAGNOSIS — E6609 Other obesity due to excess calories: Secondary | ICD-10-CM

## 2016-04-29 DIAGNOSIS — I255 Ischemic cardiomyopathy: Secondary | ICD-10-CM | POA: Diagnosis not present

## 2016-04-29 DIAGNOSIS — E782 Mixed hyperlipidemia: Secondary | ICD-10-CM

## 2016-04-29 DIAGNOSIS — I4510 Unspecified right bundle-branch block: Secondary | ICD-10-CM | POA: Diagnosis not present

## 2016-04-29 DIAGNOSIS — Z6834 Body mass index (BMI) 34.0-34.9, adult: Secondary | ICD-10-CM | POA: Diagnosis not present

## 2016-04-29 NOTE — Patient Instructions (Addendum)
Medication Instructions:  Continue same medications  Labwork: None today  Testing/Procedures: Your physician has requested that you have an echocardiogram. Echocardiography is a painless test that uses sound waves to create images of your heart. It provides your doctor with information about the size and shape of your heart and how well your heart's chambers and valves are working. This procedure takes approximately one hour. There are no restrictions for this procedure.  Andrea [name]  Your caregiver has ordered a Stress Test with nuclear imaging. The purpose of this test is to evaluate the blood supply to your heart muscle. This procedure is referred to as a "Non-Invasive Stress Test." This is because other than having an IV started in your vein, nothing is inserted or "invades" your body. Cardiac stress tests are done to find areas of poor blood flow to the heart by determining the extent of coronary artery disease (CAD). Some patients exercise on a treadmill, which naturally increases the blood flow to your heart, while others who are  unable to walk on a treadmill due to physical limitations have a pharmacologic/chemical stress agent called Lexiscan . This medicine will mimic walking on a treadmill by temporarily increasing your coronary blood flow.   Please note: these test may take anywhere between 2-4 hours to complete  PLEASE REPORT TO [place] AT THE FIRST DESK WILL DIRECT YOU WHERE TO GO  Date of Procedure:_Wednesday May 12, 2016 at 09:00AM_  Arrival Time for Procedure:_Arrive at 08:45AM to register__  Instructions regarding medication:   __X__ : Hold diabetes medication morning of procedure  __X__:  Hold metoprolol the night before procedure and morning of procedure  _X___:  Decrease your insulin the night before by half and do not take morning dose of procedure.   PLEASE NOTIFY THE OFFICE AT LEAST 7 HOURS IN ADVANCE IF YOU ARE UNABLE TO  KEEP YOUR APPOINTMENT.  [phone] AND  PLEASE NOTIFY NUCLEAR MEDICINE AT [facility] AT LEAST 24 HOURS IN ADVANCE IF YOU ARE UNABLE TO KEEP YOUR APPOINTMENT. [phone]  How to prepare for your Myoview test:  1. Do not eat or drink after midnight 2. No caffeine for 24 hours prior to test 3. No smoking 24 hours prior to test. 4. Your medication may be taken with water.  If your doctor stopped a medication because of this test, do not take that medication. 5. Ladies, please do not wear dresses.  Skirts or pants are appropriate. Please wear a short sleeve shirt. 6. No perfume, cologne or lotion. 7. Wear comfortable walking shoes. No heels!   Follow-Up: Your physician wants you to follow-up in: 6 months. You will receive a reminder letter in the mail two months in advance. If you don't receive a letter, please call our office to schedule the follow-up appointment.  It was a pleasure seeing you today here in the office. Please do not hesitate to give [place] a call back if you have any further questions. [place], BSN     Pharmacologic Stress Electrocardiogram A pharmacologic stress electrocardiogram is a heart (cardiac) test that uses nuclear imaging to evaluate the blood supply to your heart. This test may also be called a pharmacologic stress electrocardiography. Pharmacologic means that a medicine is used to increase your heart rate and blood pressure.  This stress test is done to find areas of poor blood flow to the heart by determining the extent of coronary artery disease (CAD). Some people exercise on a  treadmill, which naturally increases the blood flow to the heart. For those people unable to exercise on a treadmill, a medicine is used. This medicine stimulates your heart and will cause your heart to beat harder and more quickly, as if you were exercising.  Pharmacologic stress tests can help determine:  The adequacy of blood flow to your heart during increased levels of  activity in order to clear you for discharge home.  The extent of coronary artery blockage caused by CAD.  Your prognosis if you have suffered a heart attack.  The effectiveness of cardiac procedures done, such as an angioplasty, which can increase the circulation in your coronary arteries.  Causes of chest pain or pressure. LET [facility] CARE PROVIDER KNOW ABOUT:  Any allergies you have.  All medicines you are taking, including vitamins, herbs, eye drops, creams, and over-the-counter medicines.  Previous problems you or members of your family have had with the use of anesthetics.  Any blood disorders you have.  Previous surgeries you have had.  Medical conditions you have.  Possibility of pregnancy, if this applies.  If you are currently breastfeeding. RISKS AND COMPLICATIONS Generally, this is a safe procedure. However, as with any procedure, complications can occur. Possible complications include:  You develop pain or pressure in the following areas:  Chest.  Jaw or neck.  Between your shoulder blades.  Radiating down your left arm.  Headache.  Dizziness or light-headedness.  Shortness of breath.  Increased or irregular heartbeat.  Low blood pressure.  Nausea or vomiting.  Flushing.  Redness going up the arm and slight pain during injection of medicine.  Heart attack (rare). BEFORE THE PROCEDURE   Avoid all forms of caffeine for 24 hours before your test or as directed by your health care provider. This includes coffee, tea (even decaffeinated tea), caffeinated sodas, chocolate, cocoa, and certain pain medicines.  Follow your health care provider's instructions regarding eating and drinking before the test.  Take your medicines as directed at regular times with water unless instructed otherwise. Exceptions may include:  If you have diabetes, ask how you are to take your insulin or pills. It is common to adjust insulin dosing the morning of the  test.  If you are taking beta-blocker medicines, it is important to talk to your health care provider about these medicines well before the date of your test. Taking beta-blocker medicines may interfere with the test. In some cases, these medicines need to be changed or stopped 24 hours or more before the test.  If you wear a nitroglycerin patch, it may need to be removed prior to the test. Ask your health care provider if the patch should be removed before the test.  If you use an inhaler for any breathing condition, bring it with you to the test.  If you are an outpatient, bring a snack so you can eat right after the stress phase of the test.  Do not smoke for 4 hours prior to the test or as directed by your health care provider.  Do not apply lotions, powders, creams, or oils on your chest prior to the test.  Wear comfortable shoes and clothing. Let your health care provider know if you were unable to complete or follow the preparations for your test. PROCEDURE   Multiple patches (electrodes) will be put on your chest. If needed, small areas of your chest may be shaved to get better contact with the electrodes. Once the electrodes are attached to your body,  multiple wires will be attached to the electrodes, and your heart rate will be monitored.  An IV access will be started. A nuclear trace (isotope) is given. The isotope may be given intravenously, or it may be swallowed. Nuclear refers to several types of radioactive isotopes, and the nuclear isotope lights up the arteries so that the nuclear images are clear. The isotope is absorbed by your body. This results in low radiation exposure.  A resting nuclear image is taken to show how your heart functions at rest.  A medicine is given through the IV access.  A second scan is done about 1 hour after the medicine injection and determines how your heart functions under stress.  During this stress phase, you will be connected to an  electrocardiogram machine. Your blood pressure and oxygen levels will be monitored. AFTER THE PROCEDURE   Your heart rate and blood pressure will be monitored after the test.  You may return to your normal schedule, including diet,activities, and medicines, unless your health care provider tells you otherwise. This information is not intended to replace advice given to you by your health care provider. Make sure you discuss any questions you have with your health care provider. Document Released: 07/11/2008 Document Revised: 02/27/2013 Document Reviewed: 10/30/2012 Elsevier Interactive Patient Education  2017 [employer]. Echocardiogram An echocardiogram, or echocardiography, uses sound waves (ultrasound) to produce an image of your heart. The echocardiogram is simple, painless, obtained within a short period of time, and offers valuable information to your health care provider. The images from an echocardiogram can provide information such as:  Evidence of coronary artery disease (CAD).  Heart size.  Heart muscle function.  Heart valve function.  Aneurysm detection.  Evidence of a past heart attack.  Fluid buildup around the heart.  Heart muscle thickening.  Assess heart valve function. Tell a health care provider about:  Any allergies you have.  All medicines you are taking, including vitamins, herbs, eye drops, creams, and over-the-counter medicines.  Any problems you or family members have had with anesthetic medicines.  Any blood disorders you have.  Any surgeries you have had.  Any medical conditions you have.  Whether you are pregnant or may be pregnant. What happens before the procedure? No special preparation is needed. Eat and drink normally. What happens during the procedure?  In order to produce an image of your heart, gel will be applied to your chest and a wand-like tool (transducer) will be moved over your chest. The gel will help transmit the sound  waves from the transducer. The sound waves will harmlessly bounce off your heart to allow the heart images to be captured in real-time motion. These images will then be recorded.  You may need an IV to receive a medicine that improves the quality of the pictures. What happens after the procedure? You may return to your normal schedule including diet, activities, and medicines, unless your health care provider tells you otherwise. This information is not intended to replace advice given to you by your health care provider. Make sure you discuss any questions you have with your health care provider. Document Released: 02/20/2000 Document Revised: 10/11/2015 Document Reviewed: 10/30/2012 Elsevier Interactive Patient Education  2017 [employer].

## 2016-04-29 NOTE — Telephone Encounter (Signed)
Called Pt to schedule AWV with [facility] - knb °

## 2016-04-29 NOTE — Progress Notes (Signed)
Cardiology Office Note   Date:  04/29/2016   ID:  Andrea [name], Andrea [name] 04/20/1945, MRN [id number]  Referring Doctor:  Mar Daring, PA-C   Cardiologist:   Wende Bushy, MD   Reason for consultation:  Chief Complaint  Patient presents with  . other    New Patient. Referred by [name], PA. Pt wants to establish care here; switching from [facility].      History of Present Illness: Andrea [name] is a 71 y.o. female who presents for Transfer of care for history of coronary artery disease  Review of medical records show: Andrea [name] is a 71 y.o. female with obesity, diabetes, hyperlipidemia, right bundle branch block, history of myocardial infarction (in 1994-95) , history of systolic heart failure with ejection fraction of 45-50% in May 2014  Patient reports shortness of breath is presentation of myocardial infarction. She remembers having had an angioplasty done at that time. She admits to noncompliance with medical therapy for diabetes and even for heart disease medications.  At baseline, very limited functional capacity due to history of hip fracture, limited mobility. No chest pain or shortness of breath at rest. Next  Denies PND, orthopnea, edema. Does not recall having had any stress testing.  ROS:  Please see the history of present illness. Aside from mentioned under HPI, all other systems are reviewed and negative.     Past Medical History:  Diagnosis Date  . Coronary artery disease   . Depression   . Diabetes mellitus without complication ([place])   . MI (myocardial infarction)   . Osteoporosis   . Pneumonia 2014  . Stroke ([place])   . TIA (transient ischemic attack)     Past Surgical History:  Procedure Laterality Date  . Angio plasty  1995  . HAND SURGERY Left 2012  . HIP FRACTURE SURGERY  2015     reports that she has quit smoking. She has never used smokeless tobacco. She reports that she does not drink alcohol or use drugs.   family  history includes Anxiety disorder in her daughter; Arthritis in her daughter; Asthma in her brother; Cataracts in her mother; Depression in her daughter and mother; Hypertension in her mother; Plantar fasciitis in her daughter; Stroke in her mother.   Outpatient Medications Prior to Visit  Medication Sig Dispense Refill  . albuterol (PROVENTIL HFA;VENTOLIN HFA) 108 (90 Base) MCG/ACT inhaler Inhale 2 puffs into the lungs every 6 (six) hours as needed for wheezing or shortness of breath. 1 Inhaler 2  . Alcohol Swabs (ALCOHOL PREP) PADS Use twice daily prior to SQ injection of insulin to clean skin 100 each 6  . aspirin 81 MG tablet Take 81 mg by mouth daily.    [name] Kitchen atorvastatin (LIPITOR) 80 MG tablet Take 80 mg by mouth daily.    . Blood Glucose Calibration (ONETOUCH VERIO) SOLN     . Blood Glucose Monitoring Suppl (ONETOUCH VERIO IQ SYSTEM) w/Device KIT     . Cyanocobalamin (B-12) 1000 MCG CAPS Take 1,500 mcg by mouth.    . empagliflozin (JARDIANCE) 25 MG TABS tablet Take 25 mg by mouth daily. 90 tablet 3  . gabapentin (NEURONTIN) 600 MG tablet Take 600 mg by mouth 3 (three) times daily.    [name] Kitchen HYDROcodone-acetaminophen (NORCO) 10-325 MG tablet Take 1 tablet by mouth every 8 (eight) hours as needed. 90 tablet 0  . insulin glargine (LANTUS) 100 UNIT/ML injection Inject 0.65 mLs (65 Units total) into the skin 2 (two) times  daily with a meal. 10 mL 11  . Insulin Syringe-Needle U-100 29G 1 ML MISC Use to inject 65 units Lantus SQ BIDWC 100 each 6  . lisinopril (PRINIVIL,ZESTRIL) 5 MG tablet Take 5 mg by mouth daily.    . metoprolol succinate (TOPROL-XL) 25 MG 24 hr tablet     . Omega-3 Fatty Acids (FISH OIL) 1200 MG CAPS Take by mouth.    [name] Kitchen omeprazole (PRILOSEC) 40 MG capsule Take 1 capsule (40 mg total) by mouth daily. 90 capsule 3  . ONETOUCH DELICA LANCETS 35K MISC     . ONETOUCH VERIO test strip     . oxybutynin (DITROPAN-XL) 10 MG 24 hr tablet Take 10 mg by mouth at bedtime.    . sertraline  (ZOLOFT) 25 MG tablet Take 1 tablet (25 mg total) by mouth daily. 30 tablet 3  . terconazole (TERAZOL 3) 0.8 % vaginal cream Place 1 applicator vaginally at bedtime. 20 g 3  . Vitamin D, Ergocalciferol, (DRISDOL) 50000 units CAPS capsule Take 1 capsule (50,000 Units total) by mouth every 7 (seven) days. 12 capsule 1   No facility-administered medications prior to visit.      Allergies: Metformin and related and Sulfa antibiotics    PHYSICAL EXAM: VS:  BP (!) 108/50 (BP Location: Right Arm, Patient Position: Sitting, Cuff Size: Normal)   Pulse 63   Ht 5' (1.524 m)   Wt 174 lb 12 oz (79.3 kg)   BMI 34.13 kg/m  , Body mass index is 34.13 kg/m. Wt Readings from Last 3 Encounters:  04/29/16 174 lb 12 oz (79.3 kg)  03/11/16 178 lb 9.6 oz (81 kg)  02/12/16 177 lb 12.8 oz (80.6 kg)    GENERAL:  well developed, well nourished,  obese, not in acute distress HEENT: normocephalic, pink conjunctivae, anicteric sclerae, no xanthelasma, normal dentition, oropharynx clear NECK:  no neck vein engorgement, JVP normal, no hepatojugular reflux, carotid upstroke brisk and symmetric, no bruit, no thyromegaly, no lymphadenopathy LUNGS:  good respiratory effort, clear to auscultation bilaterally CV:  PMI not displaced, no thrills, no lifts, S1 and S2 within normal limits, no palpable S3 or S4, no murmurs, no rubs, no gallops ABD:  Soft, nontender, nondistended, normoactive bowel sounds, no abdominal aortic bruit, no hepatomegaly, no splenomegaly MS: nontender back, no kyphosis, no scoliosis, no joint deformities EXT:  2+ DP/PT pulses, no edema, no varicosities, no cyanosis, no clubbing SKIN: warm, nondiaphoretic, normal turgor, no ulcers NEUROPSYCH: alert, oriented to person, place, and time, sensory/motor grossly intact, normal mood, appropriate affect  Recent Labs: 09/02/2015: ALT 13; BUN 17; Creatinine, Ser 0.95; Platelets 197; Potassium 5.3; Sodium 139; TSH 0.967   Lipid Panel    Component Value  Date/Time   CHOL 142 09/02/2015 1214   TRIG 134 09/02/2015 1214   HDL 67 09/02/2015 1214   CHOLHDL 2.1 09/02/2015 1214   LDLCALC 48 09/02/2015 1214     Other studies Reviewed:  EKG:  The ekg from 04/29/2016 was personally reviewed by me and it revealed sinus rhythm, 63 BPM, first-degree AV block, right bundle branch block.  Additional studies/ records that were reviewed personally reviewed by me today include: None available   ASSESSMENT AND PLAN: Coronary artery disease status post MI treated with angioplasty/local therapy in the 1990s right bundle branch block Asymptomatic although very limited functional capacity Due to concerns of uncontrolled risk factors including diabetes with an A1c of 9, history of noncompliance with medications, recommend further assessment with stress testing, pharmacologic nuclear stress  test. Continue medical therapy with aspirin 81 daily, atorvastatin 80 mg by mouth daily at bedtime. Continue Lisinopril and metoprolol.  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  If weight gain of > 2 lbs over 24 hours, or > 5 lbs over 1 week, please call office.    Hyperlipidemia Lipid levels at goal (LDL < 70). Continue current medical therapy and lifestyle changes. PCP following labs  Obesity Body mass index is 34.13 kg/m.[name] Kitchen Recommend aggressive weight loss through diet and increased physical activity.   Current medicines are reviewed at length with the patient today.  The patient does not have concerns regarding medicines.  Labs/ tests ordered today include:  Orders Placed This Encounter  Procedures  . NM Myocar Multi W/Spect W/Wall Motion / EF  . ECHOCARDIOGRAM COMPLETE    I had a lengthy and detailed discussion with the patient regarding diagnoses, prognosis, diagnostic  options, treatment options , and side effects of medications.   I counseled the patient on importance of lifestyle modification including heart healthy diet, regular physical activity .   Disposition:   FU with undersigned after tests   Thank you for this consultation. We will forwarding this consultation to referring physician.    I spent at least 52mnutes with the patient today and more than 50% of the time was spent counseling the patient and coordinating care.   Signed, AWende Bushy MD  04/29/2016 5:50 PM    [place] This note was generated in part with voice recognition software and I apologize for any typographical errors that were not detected and corrected.

## 2016-05-04 NOTE — Addendum Note (Signed)
Addended by: Kittie Plater on: 05/04/2016 07:55 AM   Modules accepted: Orders

## 2016-05-07 ENCOUNTER — Encounter: Payer: Self-pay | Admitting: Physician Assistant

## 2016-05-07 ENCOUNTER — Ambulatory Visit (INDEPENDENT_AMBULATORY_CARE_PROVIDER_SITE_OTHER): Payer: Medicare Other | Admitting: Physician Assistant

## 2016-05-07 VITALS — BP 116/46 | HR 66 | Temp 97.7°F | Resp 16 | Wt 172.4 lb

## 2016-05-07 DIAGNOSIS — E1142 Type 2 diabetes mellitus with diabetic polyneuropathy: Secondary | ICD-10-CM | POA: Diagnosis not present

## 2016-05-07 DIAGNOSIS — Z794 Long term (current) use of insulin: Secondary | ICD-10-CM

## 2016-05-07 DIAGNOSIS — M5431 Sciatica, right side: Secondary | ICD-10-CM

## 2016-05-07 LAB — POCT GLYCOSYLATED HEMOGLOBIN (HGB A1C)
Est. average glucose Bld gHb Est-mCnc: >326
HEMOGLOBIN A1C: 13.5

## 2016-05-07 NOTE — Patient Instructions (Signed)
Carbohydrate Counting for Diabetes Mellitus, Adult Carbohydrate counting is a method for keeping track of how many carbohydrates you eat. Eating carbohydrates naturally increases the amount of sugar (glucose) in the blood. Counting how many carbohydrates you eat helps keep your blood glucose within normal limits, which helps you manage your diabetes (diabetes mellitus). It is important to know how many carbohydrates you can safely have in each meal. This is different for every person. A diet and nutrition specialist (registered dietitian) can help you make a meal plan and calculate how many carbohydrates you should have at each meal and snack. Carbohydrates are found in the following foods:  Grains, such as breads and cereals.  Dried beans and soy products.  Starchy vegetables, such as potatoes, peas, and corn.  Fruit and fruit juices.  Milk and yogurt.  Sweets and snack foods, such as cake, cookies, candy, chips, and soft drinks. How do I count carbohydrates? There are two ways to count carbohydrates in food. You can use either of the methods or a combination of both. Reading "Nutrition Facts" on packaged food  The "Nutrition Facts" list is included on the labels of almost all packaged foods and beverages in the [place] It includes:  The serving size.  Information about nutrients in each serving, including the grams (g) of carbohydrate per serving. To use the "Nutrition Facts":  Decide how many servings you will have.  Multiply the number of servings by the number of carbohydrates per serving.  The resulting number is the total amount of carbohydrates that you will be having. Learning standard serving sizes of other foods  When you eat foods containing carbohydrates that are not packaged or do not include "Nutrition Facts" on the label, you need to measure the servings in order to count the amount of carbohydrates:  Measure the foods that you will eat with a food scale or measuring  cup, if needed.  Decide how many standard-size servings you will eat.  Multiply the number of servings by 15. Most carbohydrate-rich foods have about 15 g of carbohydrates per serving.  For example, if you eat 8 oz (170 g) of strawberries, you will have eaten 2 servings and 30 g of carbohydrates (2 servings x 15 g = 30 g).  For foods that have more than one food mixed, such as soups and casseroles, you must count the carbohydrates in each food that is included. The following list contains standard serving sizes of common carbohydrate-rich foods. Each of these servings has about 15 g of carbohydrates:   hamburger bun or  English muffin.   oz (15 mL) syrup.   oz (14 g) jelly.  1 slice of bread.  1 six-inch tortilla.  3 oz (85 g) cooked rice or pasta.  4 oz (113 g) cooked dried beans.  4 oz (113 g) starchy vegetable, such as peas, corn, or potatoes.  4 oz (113 g) hot cereal.  4 oz (113 g) mashed potatoes or  of a large baked potato.  4 oz (113 g) canned or frozen fruit.  4 oz (120 mL) fruit juice.  4-6 crackers.  6 chicken nuggets.  6 oz (170 g) unsweetened dry cereal.  6 oz (170 g) plain fat-free yogurt or yogurt sweetened with artificial sweeteners.  8 oz (240 mL) milk.  8 oz (170 g) fresh fruit or one small piece of fruit.  24 oz (680 g) popped popcorn. Example of carbohydrate counting Sample meal  3 oz (85 g) chicken breast.  6 oz (  170 g) brown rice.  4 oz (113 g) corn.  8 oz (240 mL) milk.  8 oz (170 g) strawberries with sugar-free whipped topping. Carbohydrate calculation 1. Identify the foods that contain carbohydrates:  Rice.  Corn.  Milk.  Strawberries. 2. Calculate how many servings you have of each food:  2 servings rice.  1 serving corn.  1 serving milk.  1 serving strawberries. 3. Multiply each number of servings by 15 g:  2 servings rice x 15 g = 30 g.  1 serving corn x 15 g = 15 g.  1 serving milk x 15 g = 15  g.  1 serving strawberries x 15 g = 15 g. 4. Add together all of the amounts to find the total grams of carbohydrates eaten:  30 g + 15 g + 15 g + 15 g = 75 g of carbohydrates total. This information is not intended to replace advice given to you by your health care provider. Make sure you discuss any questions you have with your health care provider. Document Released: 02/22/2005 Document Revised: 09/12/2015 Document Reviewed: 08/06/2015 Elsevier Interactive Patient Education  2017 [employer]  

## 2016-05-07 NOTE — Progress Notes (Signed)
Patient: Andrea [name] Female    DOB: 07-09-1945   71 y.o.   MRN: [id number] Visit Date: 05/07/2016  Today's Provider: Mar Daring, PA-C   Chief Complaint  Patient presents with  . Follow-up  . Diabetes   Subjective:    HPI  Follow up ER visit  Patient was seen in ER for right buttock pain radiating down to hip on 04/30/2016. She was treated for sciatica. Treatment for this included x-ray. Patient was given Dilaudid at ER and prednisone 60 mg. She reports excellent compliance with treatment. She reports this condition is Improved. She is ambulating small distances without her walker. ------------------------------------------------------------------------------------  Diabetes Mellitus Type II, Follow-up:   Lab Results  Component Value Date   HGBA1C 13.5 05/07/2016   HGBA1C 9.8 02/12/2016   HGBA1C 9.0 09/18/2015    Last seen for diabetes 3 months ago.  Management since then includes check A1c 9.8% patient was not taking her 65 units of insulin at night. She reports excellent compliance with treatment. She is not having side effects.  Current symptoms include weight loss and have been unchanged. Home blood sugar records: fasting range: 177 this morning. Patient reports that FBS was in the 300 during her treatment with prednisone.  Episodes of hypoglycemia? no   Current Insulin Regimen: as  Most Recent Eye Exam: 2017, Dr. Kate Sable Weight trend: decreasing steadily Prior visit with dietician: no Current diet: in general, an "unhealthy" diet Current exercise: none Patient reports that she is drinking sweet tea and apple juice for the entire day. She also reports eating maybe one meal per day. She is not taking her insulin but once on most days, occasionally twice.   Pertinent Labs:    Component Value Date/Time   CHOL 142 09/02/2015 1214   TRIG 134 09/02/2015 1214   HDL 67 09/02/2015 1214   LDLCALC 48 09/02/2015 1214   CREATININE 0.95 09/02/2015  1214   CREATININE 1.02 01/01/2014 0643   Wt Readings from Last 3 Encounters:  05/07/16 172 lb 6.4 oz (78.2 kg)  04/29/16 174 lb 12 oz (79.3 kg)  03/11/16 178 lb 9.6 oz (81 kg)  ------------------------------------------------------------------------    Allergies  Allergen Reactions  . Metformin And Related Diarrhea  . Sulfa Antibiotics Nausea And Vomiting     Current Outpatient Prescriptions:  .  albuterol (PROVENTIL HFA;VENTOLIN HFA) 108 (90 Base) MCG/ACT inhaler, Inhale 2 puffs into the lungs every 6 (six) hours as needed for wheezing or shortness of breath., Disp: 1 Inhaler, Rfl: 2 .  Alcohol Swabs (ALCOHOL PREP) PADS, Use twice daily prior to SQ injection of insulin to clean skin, Disp: 100 each, Rfl: 6 .  aspirin 81 MG tablet, Take 81 mg by mouth daily., Disp: , Rfl:  .  atorvastatin (LIPITOR) 80 MG tablet, Take 80 mg by mouth daily., Disp: , Rfl:  .  Blood Glucose Calibration (ONETOUCH VERIO) SOLN, , Disp: , Rfl:  .  Blood Glucose Monitoring Suppl (ONETOUCH VERIO IQ SYSTEM) w/Device KIT, , Disp: , Rfl:  .  Cyanocobalamin (B-12) 1000 MCG CAPS, Take 1,500 mcg by mouth., Disp: , Rfl:  .  empagliflozin (JARDIANCE) 25 MG TABS tablet, Take 25 mg by mouth daily., Disp: 90 tablet, Rfl: 3 .  gabapentin (NEURONTIN) 600 MG tablet, Take 600 mg by mouth 3 (three) times daily., Disp: , Rfl:  .  HYDROcodone-acetaminophen (NORCO) 10-325 MG tablet, Take 1 tablet by mouth every 8 (eight) hours as needed., Disp: 90 tablet, Rfl:  0 .  insulin glargine (LANTUS) 100 UNIT/ML injection, Inject 0.65 mLs (65 Units total) into the skin 2 (two) times daily with a meal., Disp: 10 mL, Rfl: 11 .  Insulin Syringe-Needle U-100 29G 1 ML MISC, Use to inject 65 units Lantus SQ BIDWC, Disp: 100 each, Rfl: 6 .  lisinopril (PRINIVIL,ZESTRIL) 5 MG tablet, Take 2.5 mg by mouth daily. , Disp: , Rfl:  .  metoprolol succinate (TOPROL-XL) 25 MG 24 hr tablet, Take 12.5 mg by mouth daily. , Disp: , Rfl:  .  Omega-3 Fatty  Acids (FISH OIL) 1200 MG CAPS, Take by mouth., Disp: , Rfl:  .  omeprazole (PRILOSEC) 40 MG capsule, Take 1 capsule (40 mg total) by mouth daily., Disp: 90 capsule, Rfl: 3 .  ONETOUCH DELICA LANCETS 09W MISC, , Disp: , Rfl:  .  ONETOUCH VERIO test strip, , Disp: , Rfl:  .  oxybutynin (DITROPAN-XL) 10 MG 24 hr tablet, Take 10 mg by mouth at bedtime., Disp: , Rfl:  .  sertraline (ZOLOFT) 25 MG tablet, Take 1 tablet (25 mg total) by mouth daily., Disp: 30 tablet, Rfl: 3 .  terconazole (TERAZOL 3) 0.8 % vaginal cream, Place 1 applicator vaginally at bedtime., Disp: 20 g, Rfl: 3 .  Vitamin D, Ergocalciferol, (DRISDOL) 50000 units CAPS capsule, Take 1 capsule (50,000 Units total) by mouth every 7 (seven) days., Disp: 12 capsule, Rfl: 1  Review of Systems  Constitutional: Positive for appetite change.  HENT: Negative.   Respiratory: Negative.   Cardiovascular: Negative.   Gastrointestinal: Negative.   Endocrine: Negative.   Musculoskeletal: Positive for arthralgias.  Neurological: Negative.     Social History  Substance Use Topics  . Smoking status: Former Research scientist (life sciences)  . Smokeless tobacco: Never Used     Comment: Quit 2014  . Alcohol use No   Objective:   BP (!) 116/46 (BP Location: Left Arm, Patient Position: Sitting, Cuff Size: Large)   Pulse 66   Temp 97.7 F (36.5 C) (Oral)   Resp 16   Wt 172 lb 6.4 oz (78.2 kg)   SpO2 95%   BMI 33.67 kg/m  Vitals:   05/07/16 1400  BP: (!) 116/46  Pulse: 66  Resp: 16  Temp: 97.7 F (36.5 C)  TempSrc: Oral  SpO2: 95%  Weight: 172 lb 6.4 oz (78.2 kg)     Physical Exam  Constitutional: She appears well-developed and well-nourished. No distress.  Neck: Normal range of motion. Neck supple. No tracheal deviation present. No thyromegaly present.  Cardiovascular: Normal rate, regular rhythm and normal heart sounds.  Exam reveals no gallop and no friction rub.   No murmur heard. Pulmonary/Chest: Effort normal and breath sounds normal. No  respiratory distress. She has no wheezes. She has no rales.  Musculoskeletal: She exhibits no edema.  Lymphadenopathy:    She has no cervical adenopathy.  Skin: She is not diaphoretic.  Psychiatric: She has a normal mood and affect. Her behavior is normal. Judgment and thought content normal.  Vitals reviewed.     Assessment & Plan:     1. Type 2 diabetes mellitus with diabetic polyneuropathy, with long-term current use of insulin (HCC) Uncontrolled most likely secondary to diet and prednisone. Discussed in detail dieting habits. She needs to start trying to eat smaller meals more frequently and increase water. Continue exercise as she is doing. Make sure to take insulin twice daily. Will recheck in 3 months.  - POCT glycosylated hemoglobin (Hb A1C)  2. Sciatica of right  side Improved. Will hold off on referral or MRI unless symptoms return.        Mar Daring, PA-C  [facility]

## 2016-05-11 ENCOUNTER — Other Ambulatory Visit: Payer: Self-pay | Admitting: Physician Assistant

## 2016-05-11 ENCOUNTER — Other Ambulatory Visit: Payer: Self-pay

## 2016-05-11 DIAGNOSIS — Z794 Long term (current) use of insulin: Secondary | ICD-10-CM

## 2016-05-11 DIAGNOSIS — E1142 Type 2 diabetes mellitus with diabetic polyneuropathy: Secondary | ICD-10-CM

## 2016-05-11 MED ORDER — EMPAGLIFLOZIN 25 MG PO TABS: 25.0000 mg | ORAL_TABLET | Freq: Every day | ORAL | 3 refills | Status: DC

## 2016-05-11 NOTE — Telephone Encounter (Signed)
Pt requesting refill of Jardiance to be sent to [facility] for delivery to house. Renaldo Fiddler, CMA

## 2016-05-11 NOTE — Progress Notes (Signed)
Jardiance refilled

## 2016-05-12 ENCOUNTER — Telehealth: Payer: Self-pay

## 2016-05-12 ENCOUNTER — Encounter
Admission: RE | Admit: 2016-05-12 | Discharge: 2016-05-12 | Disposition: A | Discharge status: Home / Self Care | Payer: Medicare Other | Source: Ambulatory Visit | Attending: Cardiology | Admitting: Cardiology

## 2016-05-12 DIAGNOSIS — I2510 Atherosclerotic heart disease of native coronary artery without angina pectoris: Secondary | ICD-10-CM | POA: Diagnosis not present

## 2016-05-12 LAB — NM MYOCAR MULTI W/SPECT W/WALL MOTION / EF
CHL CUP NUCLEAR SDS: 6
CHL CUP NUCLEAR SSS: 33
CHL CUP STRESS STAGE 2 GRADE: 0 %
CHL CUP STRESS STAGE 2 SPEED: 0 mph
CHL CUP STRESS STAGE 3 GRADE: 0 %
CHL CUP STRESS STAGE 3 SPEED: 0 mph
CHL CUP STRESS STAGE 4 HR: 94 {beats}/min
CHL CUP STRESS STAGE 5 SPEED: 0 mph
CSEPPHR: 86 {beats}/min
Estimated workload: 1 METS
LVDIAVOL: 95 mL (ref 46–106)
LVSYSVOL: 57 mL
NUC STRESS TID: 1.02
Percent HR: 75 %
Percent of predicted max HR: 57 %
Rest HR: 108 {beats}/min
SRS: 27
Stage 1 Grade: 0 %
Stage 1 HR: 73 {beats}/min
Stage 1 Speed: 0 mph
Stage 2 HR: 70 {beats}/min
Stage 3 HR: 86 {beats}/min
Stage 4 Grade: 0 %
Stage 4 Speed: 0 mph
Stage 5 DBP: 52 mmHg
Stage 5 Grade: 0 %
Stage 5 HR: 93 {beats}/min
Stage 5 SBP: 120 mmHg

## 2016-05-12 MED ORDER — TECHNETIUM TC 99M TETROFOSMIN IV KIT
30.0000 | PACK | Freq: Once | INTRAVENOUS | Status: AC | PRN
  Administered 2016-05-12: 32.685 via INTRAVENOUS

## 2016-05-12 MED ORDER — TECHNETIUM TC 99M TETROFOSMIN IV KIT
13.0000 | PACK | Freq: Once | INTRAVENOUS | Status: AC | PRN
  Administered 2016-05-12: 12.62 via INTRAVENOUS

## 2016-05-12 MED ORDER — REGADENOSON 0.4 MG/5ML IV SOLN
0.4000 mg | Freq: Once | INTRAVENOUS | Status: AC
  Administered 2016-05-12: 0.4 mg via INTRAVENOUS

## 2016-05-12 NOTE — Telephone Encounter (Signed)
Pt wanted to inform you that she had her stress test this morning, and she did NOT experience any nausea or headache. This is just an [place]. Renaldo Fiddler, CMA

## 2016-05-12 NOTE — Telephone Encounter (Signed)
Ok great.

## 2016-05-13 ENCOUNTER — Ambulatory Visit: Payer: Medicare Other | Admitting: Physician Assistant

## 2016-05-21 ENCOUNTER — Telehealth: Payer: Self-pay

## 2016-05-21 NOTE — Telephone Encounter (Signed)
Patient called and states Vania Rea is costing pt $100 something and she can not afford this. What does she need to do?-aa

## 2016-05-21 NOTE — Telephone Encounter (Signed)
Patient advised. She will [employer], [employer]. One month supply of samples at the front desk for pick up.

## 2016-05-21 NOTE — Telephone Encounter (Signed)
She can contact the pharmaceutical company like she did previously to see if she can get it from them. Last time she got the form then they brought to me for me to write the Rx and then the company mails the supply to her house. If we have a sample we can give to cover until this is completed. Ok to give 2 weeks to a month supply of samples if we have them.

## 2016-05-27 ENCOUNTER — Ambulatory Visit (INDEPENDENT_AMBULATORY_CARE_PROVIDER_SITE_OTHER): Payer: Medicare Other

## 2016-05-27 ENCOUNTER — Encounter: Payer: Self-pay | Admitting: Cardiology

## 2016-05-27 ENCOUNTER — Ambulatory Visit (INDEPENDENT_AMBULATORY_CARE_PROVIDER_SITE_OTHER): Payer: Medicare Other | Admitting: Cardiology

## 2016-05-27 ENCOUNTER — Telehealth: Payer: Self-pay | Admitting: Physician Assistant

## 2016-05-27 ENCOUNTER — Other Ambulatory Visit: Payer: Self-pay

## 2016-05-27 VITALS — BP 98/60 | HR 79 | Ht 60.0 in | Wt 178.5 lb

## 2016-05-27 DIAGNOSIS — E6609 Other obesity due to excess calories: Secondary | ICD-10-CM | POA: Diagnosis not present

## 2016-05-27 DIAGNOSIS — I4510 Unspecified right bundle-branch block: Secondary | ICD-10-CM

## 2016-05-27 DIAGNOSIS — I2510 Atherosclerotic heart disease of native coronary artery without angina pectoris: Secondary | ICD-10-CM | POA: Diagnosis not present

## 2016-05-27 DIAGNOSIS — E782 Mixed hyperlipidemia: Secondary | ICD-10-CM | POA: Diagnosis not present

## 2016-05-27 DIAGNOSIS — Z6834 Body mass index (BMI) 34.0-34.9, adult: Secondary | ICD-10-CM | POA: Diagnosis not present

## 2016-05-27 DIAGNOSIS — M545 Low back pain, unspecified: Secondary | ICD-10-CM

## 2016-05-27 DIAGNOSIS — G8929 Other chronic pain: Secondary | ICD-10-CM

## 2016-05-27 MED ORDER — HYDROCODONE-ACETAMINOPHEN 10-325 MG PO TABS: 1.0000 | ORAL_TABLET | Freq: Three times a day (TID) | ORAL | 0 refills | Status: DC | PRN

## 2016-05-27 NOTE — Progress Notes (Signed)
Cardiology Office Note   Date:  05/27/2016   ID:  Soul, Deveney 12/04/1945, MRN [id number]  Referring Doctor:  Mar Daring, PA-C   Cardiologist:   Wende Bushy, MD   Reason for consultation:  Chief Complaint  Patient presents with  . other    follow up from testing.  Meds verbally reviewed with patient.        History of Present Illness: Andrea [name] is a 71 y.o. female who presents for ffup after tests, CAD  Review of medical records show: Andrea [name] is a 71 y.o. female with obesity, diabetes, hyperlipidemia, right bundle branch block, history of myocardial infarction (in 1994-95) , history of systolic heart failure with ejection fraction of 45-50% in May 2014  Since last visit, pt doing well. SOB at baseline, no CP.  No PND, orhtopnea, edema.  ROS:  Please see the history of present illness. Aside from mentioned under HPI, all other systems are reviewed and negative.     Past Medical History:  Diagnosis Date  . Coronary artery disease   . Depression   . Diabetes mellitus without complication ([place])   . MI (myocardial infarction)   . Osteoporosis   . Pneumonia 2014  . Stroke ([place])   . TIA (transient ischemic attack)     Past Surgical History:  Procedure Laterality Date  . Angio plasty  1995  . HAND SURGERY Left 2012  . HIP FRACTURE SURGERY  2015     reports that she has quit smoking. She has never used smokeless tobacco. She reports that she does not drink alcohol or use drugs.   family history includes Anxiety disorder in her daughter; Arthritis in her daughter; Asthma in her brother; Cataracts in her mother; Depression in her daughter and mother; Hypertension in her mother; Plantar fasciitis in her daughter; Stroke in her mother.   Outpatient Medications Prior to Visit  Medication Sig Dispense Refill  . albuterol (PROVENTIL HFA;VENTOLIN HFA) 108 (90 Base) MCG/ACT inhaler Inhale 2 puffs into the lungs every 6 (six) hours as needed for  wheezing or shortness of breath. 1 Inhaler 2  . Alcohol Swabs (ALCOHOL PREP) PADS Use twice daily prior to SQ injection of insulin to clean skin 100 each 6  . aspirin 81 MG tablet Take 81 mg by mouth daily.    [name] Kitchen atorvastatin (LIPITOR) 80 MG tablet Take 80 mg by mouth daily.    . Blood Glucose Calibration (ONETOUCH VERIO) SOLN     . Blood Glucose Monitoring Suppl (ONETOUCH VERIO IQ SYSTEM) w/Device KIT     . Cyanocobalamin (B-12) 1000 MCG CAPS Take 1,500 mcg by mouth.    . empagliflozin (JARDIANCE) 25 MG TABS tablet Take 25 mg by mouth daily. 90 tablet 3  . gabapentin (NEURONTIN) 600 MG tablet Take 600 mg by mouth 3 (three) times daily.    [name] Kitchen HYDROcodone-acetaminophen (NORCO) 10-325 MG tablet Take 1 tablet by mouth every 8 (eight) hours as needed. 90 tablet 0  . insulin glargine (LANTUS) 100 UNIT/ML injection Inject 0.65 mLs (65 Units total) into the skin 2 (two) times daily with a meal. 10 mL 11  . Insulin Syringe-Needle U-100 29G 1 ML MISC Use to inject 65 units Lantus SQ BIDWC 100 each 6  . lisinopril (PRINIVIL,ZESTRIL) 5 MG tablet Take 2.5 mg by mouth daily.     . metoprolol succinate (TOPROL-XL) 25 MG 24 hr tablet Take 12.5 mg by mouth daily.     . Omega-3 Fatty  Acids (FISH OIL) 1200 MG CAPS Take by mouth.    [name] Kitchen omeprazole (PRILOSEC) 40 MG capsule Take 1 capsule (40 mg total) by mouth daily. 90 capsule 3  . ONETOUCH DELICA LANCETS 12Y MISC     . ONETOUCH VERIO test strip     . oxybutynin (DITROPAN-XL) 10 MG 24 hr tablet Take 10 mg by mouth at bedtime.    . sertraline (ZOLOFT) 25 MG tablet Take 1 tablet (25 mg total) by mouth daily. 30 tablet 3  . terconazole (TERAZOL 3) 0.8 % vaginal cream Place 1 applicator vaginally at bedtime. 20 g 3  . Vitamin D, Ergocalciferol, (DRISDOL) 50000 units CAPS capsule Take 1 capsule (50,000 Units total) by mouth every 7 (seven) days. 12 capsule 1   No facility-administered medications prior to visit.      Allergies: Metformin and related and Sulfa  antibiotics    PHYSICAL EXAM: VS:  BP 98/60 (BP Location: Left Arm, Patient Position: Sitting, Cuff Size: Normal)   Pulse 79   Ht 5' (1.524 m)   Wt 178 lb 8 oz (81 kg)   BMI 34.86 kg/m  , Body mass index is 34.86 kg/m. Wt Readings from Last 3 Encounters:  05/27/16 178 lb 8 oz (81 kg)  05/07/16 172 lb 6.4 oz (78.2 kg)  04/29/16 174 lb 12 oz (79.3 kg)    GENERAL:  well developed, well nourished, obese, not in acute distress HEENT: normocephalic, pink conjunctivae, anicteric sclerae, no xanthelasma, normal dentition, oropharynx clear NECK:  no neck vein engorgement, JVP normal, no hepatojugular reflux, carotid upstroke brisk and symmetric, no bruit, no thyromegaly, no lymphadenopathy LUNGS:  good respiratory effort, clear to auscultation bilaterally CV:  PMI not displaced, no thrills, no lifts, S1 and S2 within normal limits, no palpable S3 or S4, no murmurs, no rubs, no gallops ABD:  Soft, nontender, nondistended, normoactive bowel sounds, no abdominal aortic bruit, no hepatomegaly, no splenomegaly MS: nontender back, kyphosis, no scoliosis, no joint deformities EXT:  2+ DP/PT pulses, no edema, no varicosities, no cyanosis, no clubbing SKIN: warm, nondiaphoretic, normal turgor, no ulcers NEUROPSYCH: alert, oriented to person, place, and time, sensory/motor grossly intact, normal mood, appropriate affect    Recent Labs: 09/02/2015: ALT 13; BUN 17; Creatinine, Ser 0.95; Platelets 197; Potassium 5.3; Sodium 139; TSH 0.967   Lipid Panel    Component Value Date/Time   CHOL 142 09/02/2015 1214   TRIG 134 09/02/2015 1214   HDL 67 09/02/2015 1214   CHOLHDL 2.1 09/02/2015 1214   LDLCALC 48 09/02/2015 1214     Other studies Reviewed:  EKG:  The ekg from 04/29/2016 was personally reviewed by me and it revealed sinus rhythm, 63 BPM, first-degree AV block, right bundle branch block.  Additional studies/ records that were reviewed personally reviewed by me today include: Nuclear stress  test 05/12/2016: Pharmacological myocardial perfusion imaging study with no significant ischemia Large region of severe fixed perfusion defect in the mid anterior wall extending to apical region, anteroseptal wall, apical, apical inferior region, consistent with previous MI Large region of Wall motion abnormality noted in the region above EF estimated at 56% No EKG changes concerning for ischemia at peak stress or in recovery. Baseline EKG concerning for old anterior MI At least moderate risk scan given size of perfusion defect    ASSESSMENT AND PLAN: Coronary artery disease status post MI treated with angioplasty/local therapy in the 1990s right bundle branch block No evidence of ischemia on stress test. Fixed perfusion defect in anterior,  although EF nl. Will await echo. Recommendations the same as per last visit 04/29/2106: Continue medical therapy with aspirin 81 daily, atorvastatin 80 mg by mouth daily at bedtime. Continue Lisinopril and metoprolol.  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  If weight gain of > 2 lbs over 24 hours, or > 5 lbs over 1 week, please call office.  Await echo.   Hyperlipidemia Lipid levels at goal (LDL < 70). Continue current medical therapy and lifestyle changes.   Obesity Body mass index is 34.86 kg/m. Recommend aggressive weight loss through diet and increased physical activity.   Current medicines are reviewed at length with the patient today.  The patient does not have concerns regarding medicines.  Labs/ tests ordered today include:  No orders of the defined types were placed in this encounter.   I had a lengthy and detailed discussion with the patient regarding diagnoses, prognosis, diagnostic options, treatment  options , and side effects of medications.   I counseled the patient on importance of lifestyle modification including heart healthy diet, regular physical activity .   Disposition:   FU with Cardiologyin 6 mo  Thank you for this consultation. We will forwarding this consultation to referring physician.   I spent at least 25 minutes with the patient today and more than 50% of the time was spent counseling the patient and coordinating care.    Signed, Wende Bushy, MD  05/27/2016 2:42 PM    [place]  This note was generated in part with voice recognition software and I apologize for any typographical errors that were not detected and corrected.

## 2016-05-27 NOTE — Telephone Encounter (Signed)
Pt contacted office for refill request on the following medications: HYDROcodone-acetaminophen (NORCO) 10-325 MG tablet Last Rx: 04/21/16 Pt stated that she will be in the area this afternoon and would really like to pick up the Rx this afternoon if possible b/c she has a long drive from [place]. Please advise. Thanks TNP

## 2016-05-27 NOTE — Telephone Encounter (Signed)
LM that Rx printed and is placed up front ready for pick up.  Thanks,  -Taler Kushner

## 2016-05-27 NOTE — Patient Instructions (Signed)
Follow-Up: Your physician wants you to follow-up in: 6 months. You will receive a reminder letter in the mail two months in advance. If you don't receive a letter, please call our office to schedule the follow-up appointment.  It was a pleasure seeing you today here in the office. Please do not hesitate to give us a call back if you have any further questions. [phone]  Lynora Dymond A. RN, BSN     

## 2016-05-27 NOTE — Telephone Encounter (Signed)
Please review.   Last prescription was 04/21/16 Qty:90

## 2016-05-27 NOTE — Telephone Encounter (Signed)
Rx printed. [place] substance abuse registry checked. Patient had last Rx filled on 04/30/16 at [facility]. She does not request or fill early, does not pharmacy shop and Rx is only filled by me.

## 2016-05-31 ENCOUNTER — Telehealth: Payer: Self-pay | Admitting: Physician Assistant

## 2016-05-31 NOTE — Telephone Encounter (Signed)
Please review-aa 

## 2016-05-31 NOTE — Telephone Encounter (Signed)
Ok to give one month supply of samples if we have them. And the paper work she would have to get by calling the [place] like she did last year and they mail the paperwork to her. Once she gets it she would bring it to him to complete the Rx part of it. Joy may need to call for her to get the paperwork mailed to her. Or if possible and someone has time we can see if we can get the paperwork mailed to her. It is for the patient assistance program for patient's with financial issues.

## 2016-05-31 NOTE — Telephone Encounter (Signed)
Pt called about getting samples of empagliflozin (JARDIANCE) 25 MG TABS tablet  She also said that there is paper work that needs to be filled out about getting the refills for Jardiance  if it could be sent home to her by her daughter when she picks up the samples.  Thank sTeri

## 2016-05-31 NOTE — Telephone Encounter (Signed)
Patient was advised as directed below. Samples placed up front ready for pick up.  Thanks,  -Vernice Mannina

## 2016-06-14 ENCOUNTER — Other Ambulatory Visit: Payer: Self-pay | Admitting: Physician Assistant

## 2016-06-14 DIAGNOSIS — E1140 Type 2 diabetes mellitus with diabetic neuropathy, unspecified: Secondary | ICD-10-CM

## 2016-06-14 DIAGNOSIS — N3281 Overactive bladder: Secondary | ICD-10-CM

## 2016-06-14 MED ORDER — GABAPENTIN 600 MG PO TABS: 600.0000 mg | ORAL_TABLET | Freq: Three times a day (TID) | ORAL | 5 refills | Status: DC

## 2016-06-14 MED ORDER — OXYBUTYNIN CHLORIDE ER 10 MG PO TB24: 10.0000 mg | ORAL_TABLET | Freq: Every day | ORAL | 5 refills | Status: DC

## 2016-06-14 NOTE — Telephone Encounter (Signed)
[facility] faxed a request on the following medications for patient.  Thanks CC   gabapentin (NEURONTIN) 600 MG tablet  Take one Tablet by mouth three times a day for back/leg pain.   oxybutynin (DITROPAN-XL) 10 MG 24 hr tablet Take one tablet by mouth once daily for bladder control.

## 2016-06-16 ENCOUNTER — Telehealth: Payer: Self-pay

## 2016-06-16 ENCOUNTER — Other Ambulatory Visit: Payer: Self-pay | Admitting: Physician Assistant

## 2016-06-16 DIAGNOSIS — Z794 Long term (current) use of insulin: Secondary | ICD-10-CM

## 2016-06-16 DIAGNOSIS — E1142 Type 2 diabetes mellitus with diabetic polyneuropathy: Secondary | ICD-10-CM

## 2016-06-16 MED ORDER — EMPAGLIFLOZIN 25 MG PO TABS: 25.0000 mg | ORAL_TABLET | Freq: Every day | ORAL | 3 refills | Status: DC

## 2016-06-16 NOTE — Telephone Encounter (Signed)
Patient was advised that her prescription for Jardiance is placed up front ready for pick up, together with the completed form.  Thanks,  -Andrea [name] Copy of the form was made to be can in patient's chart.

## 2016-06-16 NOTE — Progress Notes (Signed)
jardiance printed to be sent to [employer] with patient information for patient financial assistance.

## 2016-06-23 ENCOUNTER — Other Ambulatory Visit: Payer: Self-pay | Admitting: Physician Assistant

## 2016-06-23 DIAGNOSIS — E1142 Type 2 diabetes mellitus with diabetic polyneuropathy: Secondary | ICD-10-CM

## 2016-06-23 MED ORDER — ONETOUCH VERIO VI STRP: ORAL_STRIP | 3 refills | Status: DC

## 2016-06-23 NOTE — Telephone Encounter (Signed)
Refill  ONETOUCH VERIO test strip  [facility]

## 2016-06-24 ENCOUNTER — Telehealth: Payer: Self-pay | Admitting: Physician Assistant

## 2016-06-24 NOTE — Telephone Encounter (Signed)
Error/MW °

## 2016-06-28 ENCOUNTER — Other Ambulatory Visit: Payer: Self-pay | Admitting: Physician Assistant

## 2016-06-28 DIAGNOSIS — M545 Low back pain, unspecified: Secondary | ICD-10-CM

## 2016-06-28 DIAGNOSIS — G8929 Other chronic pain: Secondary | ICD-10-CM

## 2016-06-28 MED ORDER — HYDROCODONE-ACETAMINOPHEN 10-325 MG PO TABS: 1.0000 | ORAL_TABLET | Freq: Three times a day (TID) | ORAL | 0 refills | Status: DC | PRN

## 2016-06-28 NOTE — Telephone Encounter (Signed)
Rx printed for Norco. [place] Substance abuse registry checked and no flags noted. May give one month of Jardiance 25mg  if we have enough.

## 2016-06-28 NOTE — Telephone Encounter (Signed)
Pt needs refill   HYDROcodone-acetaminophen (NORCO) 10-325 MG tablet  And sample of Jardiance.  Please call when ready to pick up [phone]  Thanks teri

## 2016-06-28 NOTE — Telephone Encounter (Signed)
Patient advised as directed below.  Thanks,  -Herrick Hartog 

## 2016-07-12 ENCOUNTER — Other Ambulatory Visit: Payer: Self-pay | Admitting: Physician Assistant

## 2016-07-12 DIAGNOSIS — F331 Major depressive disorder, recurrent, moderate: Secondary | ICD-10-CM

## 2016-07-12 MED ORDER — SERTRALINE HCL 25 MG PO TABS: 25.0000 mg | ORAL_TABLET | Freq: Every day | ORAL | 5 refills | Status: DC

## 2016-07-12 NOTE — Telephone Encounter (Signed)
[facility] faxed a request on the following medication. Thanks CC  sertraline (ZOLOFT) 25 MG tablet  Take one tablet by mouth once daily.

## 2016-07-13 ENCOUNTER — Telehealth: Payer: Self-pay | Admitting: Physician Assistant

## 2016-07-13 ENCOUNTER — Telehealth: Payer: Self-pay | Admitting: Cardiology

## 2016-07-13 DIAGNOSIS — I10 Essential (primary) hypertension: Secondary | ICD-10-CM

## 2016-07-13 DIAGNOSIS — Z780 Asymptomatic menopausal state: Secondary | ICD-10-CM

## 2016-07-13 DIAGNOSIS — F331 Major depressive disorder, recurrent, moderate: Secondary | ICD-10-CM

## 2016-07-13 MED ORDER — VITAMIN D (ERGOCALCIFEROL) 1.25 MG (50000 UNIT) PO CAPS: 50000.0000 [IU] | ORAL_CAPSULE | ORAL | 1 refills | Status: DC

## 2016-07-13 MED ORDER — LISINOPRIL 5 MG PO TABS: 5.0000 mg | ORAL_TABLET | Freq: Every day | ORAL | 5 refills | Status: DC

## 2016-07-13 MED ORDER — SERTRALINE HCL 25 MG PO TABS: 25.0000 mg | ORAL_TABLET | Freq: Every day | ORAL | 5 refills | Status: DC

## 2016-07-13 MED ORDER — METOPROLOL SUCCINATE ER 25 MG PO TB24: 25.0000 mg | ORAL_TABLET | Freq: Every day | ORAL | 5 refills | Status: DC

## 2016-07-13 NOTE — Telephone Encounter (Signed)
Please review for refill. Thanks!  

## 2016-07-13 NOTE — Telephone Encounter (Signed)
Please review and advise. KW 

## 2016-07-13 NOTE — Telephone Encounter (Signed)
refilled 

## 2016-07-13 NOTE — Telephone Encounter (Signed)
Pt needs refill on   metoprolol succinate (TOPROL-XL) 25 MG 24 hr tablet  sertraline (ZOLOFT) 25 MG tablet  Vitamin D, Ergocalciferol, (DRISDOL) 50000 units CAPS capsule  lisinopril (PRINIVIL,ZESTRIL) 5 MG tablet  [facility]  ThanksTeri

## 2016-07-13 NOTE — Telephone Encounter (Signed)
°*  STAT* If patient is at the pharmacy, call can be transferred to refill team.   1. Which medications need to be refilled? (please list name of each medication and dose if known)  Metoprolol and Lisinopril   2. Which pharmacy/location (including street and city if local pharmacy) is medication to be sent to? [facility]   3. Do they need a 30 day or 90 day supply? 90 day

## 2016-07-13 NOTE — Telephone Encounter (Signed)
Spoke with patient and let her know that medications have already been sent in for refill. She was appreciative for the call and has no further questions at this time.

## 2016-07-15 ENCOUNTER — Telehealth: Payer: Self-pay | Admitting: Physician Assistant

## 2016-07-15 DIAGNOSIS — I10 Essential (primary) hypertension: Secondary | ICD-10-CM

## 2016-07-15 MED ORDER — LISINOPRIL 5 MG PO TABS: 2.5000 mg | ORAL_TABLET | Freq: Every day | ORAL | 3 refills | Status: DC

## 2016-07-15 MED ORDER — METOPROLOL SUCCINATE ER 25 MG PO TB24
12.5000 mg | ORAL_TABLET | Freq: Every day | ORAL | 3 refills | Status: DC
Start: 2016-07-15 — End: 2017-07-20

## 2016-07-15 NOTE — Telephone Encounter (Signed)
Yes she should only be taking half. I will change.

## 2016-07-15 NOTE — Telephone Encounter (Signed)
Pt states she had been metoprolol 12.5 mg and lisinopril 2.5 mg daily. Pt states that these were filled as 1 whole tablet daily, and was calling to ask if she needed to increase the dose to the whole tablet. Last 3 OV BP's were 116/46, 120/60, 120/50. Pt also states were written for #30 tablets, instead of #45 that they are usually written for. I advised pt to only take 1/2 tablet until I can verify this with you. Please advise. Renaldo Fiddler, CMA

## 2016-07-15 NOTE — Telephone Encounter (Signed)
Pt advised and agrees with treatment plan. Dashton Czerwinski Drozdowski, CMA  

## 2016-07-15 NOTE — Telephone Encounter (Signed)
Pt is needing clarification on her   metoprolol succinate (TOPROL-XL) 25 MG 24 hr tablet  lisinopril (PRINIVIL,ZESTRIL) 5 MG tablet  She is not sure what dosage she is suppose to be taking   [employer]

## 2016-07-16 ENCOUNTER — Telehealth: Payer: Self-pay

## 2016-07-16 DIAGNOSIS — Z794 Long term (current) use of insulin: Secondary | ICD-10-CM

## 2016-07-16 DIAGNOSIS — E1142 Type 2 diabetes mellitus with diabetic polyneuropathy: Secondary | ICD-10-CM

## 2016-07-16 MED ORDER — EMPAGLIFLOZIN 25 MG PO TABS: 25.0000 mg | ORAL_TABLET | Freq: Every day | ORAL | 3 refills | Status: DC

## 2016-07-16 NOTE — Telephone Encounter (Signed)
RX faxed to assistance program-aa

## 2016-07-19 LAB — HM DIABETES EYE EXAM

## 2016-07-23 ENCOUNTER — Other Ambulatory Visit: Payer: Self-pay

## 2016-07-23 DIAGNOSIS — E1142 Type 2 diabetes mellitus with diabetic polyneuropathy: Secondary | ICD-10-CM

## 2016-07-23 DIAGNOSIS — E7800 Pure hypercholesterolemia, unspecified: Secondary | ICD-10-CM

## 2016-07-23 DIAGNOSIS — Z794 Long term (current) use of insulin: Secondary | ICD-10-CM

## 2016-07-23 MED ORDER — ATORVASTATIN CALCIUM 80 MG PO TABS: 80.0000 mg | ORAL_TABLET | Freq: Every day | ORAL | 1 refills | Status: DC

## 2016-07-23 MED ORDER — INSULIN GLARGINE 100 UNIT/ML ~~LOC~~ SOLN: 65.0000 [IU] | Freq: Two times a day (BID) | SUBCUTANEOUS | 11 refills | Status: DC

## 2016-07-23 NOTE — Telephone Encounter (Signed)
Request for New RX Atorvastatin Calcium 80MG  Tab  Qty:90 R;3  Pharmacy: [facility] History: 07/31/15  90 10/23/15  90 01/14/16 90 04/27/16 90

## 2016-07-23 NOTE — Telephone Encounter (Signed)
Last ov 05/07/16 Last filled 02/16/16 Please review. Thank you. sd

## 2016-07-26 ENCOUNTER — Other Ambulatory Visit: Payer: Self-pay | Admitting: Physician Assistant

## 2016-07-26 DIAGNOSIS — G8929 Other chronic pain: Secondary | ICD-10-CM

## 2016-07-26 DIAGNOSIS — M545 Low back pain, unspecified: Secondary | ICD-10-CM

## 2016-07-26 MED ORDER — HYDROCODONE-ACETAMINOPHEN 10-325 MG PO TABS: 1.0000 | ORAL_TABLET | Freq: Three times a day (TID) | ORAL | 0 refills | Status: DC | PRN

## 2016-07-26 NOTE — Telephone Encounter (Signed)
pt needs refill  HYDROcodone-acetaminophen (NORCO) 10-325 MG tablet  Pt would like to pick tomorrow.  teri

## 2016-08-09 ENCOUNTER — Encounter: Payer: Self-pay | Admitting: Physician Assistant

## 2016-08-09 ENCOUNTER — Ambulatory Visit (INDEPENDENT_AMBULATORY_CARE_PROVIDER_SITE_OTHER): Payer: Medicare Other | Admitting: Physician Assistant

## 2016-08-09 ENCOUNTER — Ambulatory Visit
Admission: RE | Admit: 2016-08-09 | Discharge: 2016-08-09 | Disposition: A | Discharge status: Home / Self Care | Payer: Medicare Other | Source: Ambulatory Visit | Attending: Physician Assistant | Admitting: Physician Assistant

## 2016-08-09 VITALS — BP 106/56 | HR 76 | Temp 98.0°F | Resp 16 | Wt 177.0 lb

## 2016-08-09 DIAGNOSIS — M5441 Lumbago with sciatica, right side: Secondary | ICD-10-CM | POA: Diagnosis not present

## 2016-08-09 DIAGNOSIS — I10 Essential (primary) hypertension: Secondary | ICD-10-CM | POA: Diagnosis not present

## 2016-08-09 DIAGNOSIS — E113299 Type 2 diabetes mellitus with mild nonproliferative diabetic retinopathy without macular edema, unspecified eye: Secondary | ICD-10-CM | POA: Diagnosis not present

## 2016-08-09 DIAGNOSIS — M5136 Other intervertebral disc degeneration, lumbar region: Secondary | ICD-10-CM | POA: Diagnosis not present

## 2016-08-09 DIAGNOSIS — Z794 Long term (current) use of insulin: Secondary | ICD-10-CM

## 2016-08-09 DIAGNOSIS — G8929 Other chronic pain: Secondary | ICD-10-CM | POA: Diagnosis present

## 2016-08-09 DIAGNOSIS — Z6834 Body mass index (BMI) 34.0-34.9, adult: Secondary | ICD-10-CM

## 2016-08-09 DIAGNOSIS — M5137 Other intervertebral disc degeneration, lumbosacral region: Secondary | ICD-10-CM | POA: Insufficient documentation

## 2016-08-09 DIAGNOSIS — E6609 Other obesity due to excess calories: Secondary | ICD-10-CM

## 2016-08-09 LAB — POCT GLYCOSYLATED HEMOGLOBIN (HGB A1C)
ESTIMATED AVERAGE GLUCOSE: 235
HEMOGLOBIN A1C: 9.8

## 2016-08-09 MED ORDER — LOSARTAN POTASSIUM 50 MG PO TABS: 25.0000 mg | ORAL_TABLET | Freq: Every day | ORAL | 3 refills | Status: DC

## 2016-08-09 NOTE — Patient Instructions (Signed)

## 2016-08-09 NOTE — Progress Notes (Addendum)
Patient: Andrea [name] Female    DOB: Apr 27, 1945   71 y.o.   MRN: [id number] Visit Date: 08/09/2016  Today's Provider: Mar Daring, PA-C   Chief Complaint  Patient presents with  . Diabetes  . Leg Pain   Subjective:    HPI  Diabetes Mellitus Type II, Follow-up:   Lab Results  Component Value Date   HGBA1C 9.8 08/09/2016   HGBA1C 13.5 05/07/2016   HGBA1C 9.8 02/12/2016    Last seen for diabetes 3 months ago.  Management since then includes no changes. She reports excellent compliance with treatment. She is not having side effects. Current symptoms include paresthesia of the feet and have been stable. Home blood sugar records: fasting range: 75 today  Episodes of hypoglycemia? no   Current Insulin Regimen: no change Most Recent Eye Exam: UTD Weight trend: stable Prior visit with dietician: no Current diet: in general, a "healthy" diet   Current exercise: none  Pertinent Labs:    Component Value Date/Time   CHOL 142 09/02/2015 1214   TRIG 134 09/02/2015 1214   HDL 67 09/02/2015 1214   LDLCALC 48 09/02/2015 1214   CREATININE 0.95 09/02/2015 1214   CREATININE 1.02 01/01/2014 0643    Wt Readings from Last 3 Encounters:  08/09/16 177 lb (80.3 kg)  05/27/16 178 lb 8 oz (81 kg)  05/07/16 172 lb 6.4 oz (78.2 kg)    ------------------------------------------------------------------------ Patient C/O leg pain right worse than left. Patient reports that medications have not been able to control pain. Patient reports that pain is worse with activity. She has known back issues since 2001. She reports symptoms have recently worsened from previous. She reports that her pain starts in the lower back and radiates down the right leg. She has had imaging of the lumbar spine including MRIs, most recently in 2009. She reports also previously being seen by a neurosurgeon but this was back in 2001-2002. She does also report remote history of one ESI that was  unsuccessful. No loss of bowel or bladder function.     Allergies  Allergen Reactions  . Metformin And Related Diarrhea  . Sulfa Antibiotics Nausea And Vomiting     Current Outpatient Prescriptions:  .  albuterol (PROVENTIL HFA;VENTOLIN HFA) 108 (90 Base) MCG/ACT inhaler, Inhale 2 puffs into the lungs every 6 (six) hours as needed for wheezing or shortness of breath., Disp: 1 Inhaler, Rfl: 2 .  Alcohol Swabs (ALCOHOL PREP) PADS, Use twice daily prior to SQ injection of insulin to clean skin, Disp: 100 each, Rfl: 6 .  aspirin 81 MG tablet, Take 81 mg by mouth daily., Disp: , Rfl:  .  atorvastatin (LIPITOR) 80 MG tablet, Take 1 tablet (80 mg total) by mouth daily., Disp: 90 tablet, Rfl: 1 .  Blood Glucose Calibration (ONETOUCH VERIO) SOLN, , Disp: , Rfl:  .  Blood Glucose Monitoring Suppl (ONETOUCH VERIO IQ SYSTEM) w/Device KIT, , Disp: , Rfl:  .  Cyanocobalamin (B-12) 1000 MCG CAPS, Take 1,500 mcg by mouth., Disp: , Rfl:  .  empagliflozin (JARDIANCE) 25 MG TABS tablet, Take 25 mg by mouth daily., Disp: 90 tablet, Rfl: 3 .  gabapentin (NEURONTIN) 600 MG tablet, Take 1 tablet (600 mg total) by mouth 3 (three) times daily., Disp: 90 tablet, Rfl: 5 .  HYDROcodone-acetaminophen (NORCO) 10-325 MG tablet, Take 1 tablet by mouth every 8 (eight) hours as needed., Disp: 90 tablet, Rfl: 0 .  insulin glargine (LANTUS) 100 UNIT/ML injection,  Inject 0.65 mLs (65 Units total) into the skin 2 (two) times daily with a meal., Disp: 10 mL, Rfl: 11 .  Insulin Syringe-Needle U-100 29G 1 ML MISC, Use to inject 65 units Lantus SQ BIDWC, Disp: 100 each, Rfl: 6 .  lisinopril (PRINIVIL,ZESTRIL) 5 MG tablet, Take 0.5 tablets (2.5 mg total) by mouth daily., Disp: 45 tablet, Rfl: 3 .  metoprolol succinate (TOPROL-XL) 25 MG 24 hr tablet, Take 0.5 tablets (12.5 mg total) by mouth daily., Disp: 45 tablet, Rfl: 3 .  Multiple Vitamins-Minerals (ICAPS AREDS 2 PO), Take 1 capsule by mouth daily., Disp: , Rfl:  .  Omega-3 Fatty  Acids (FISH OIL) 1200 MG CAPS, Take by mouth., Disp: , Rfl:  .  omeprazole (PRILOSEC) 40 MG capsule, Take 1 capsule (40 mg total) by mouth daily., Disp: 90 capsule, Rfl: 3 .  ONETOUCH DELICA LANCETS 78G MISC, , Disp: , Rfl:  .  ONETOUCH VERIO test strip, To check blood sugar once daily, Disp: 100 each, Rfl: 3 .  oxybutynin (DITROPAN-XL) 10 MG 24 hr tablet, Take 1 tablet (10 mg total) by mouth at bedtime., Disp: 30 tablet, Rfl: 5 .  sertraline (ZOLOFT) 25 MG tablet, Take 1 tablet (25 mg total) by mouth daily., Disp: 30 tablet, Rfl: 5 .  terconazole (TERAZOL 3) 0.8 % vaginal cream, Place 1 applicator vaginally at bedtime., Disp: 20 g, Rfl: 3 .  Vitamin D, Ergocalciferol, (DRISDOL) 50000 units CAPS capsule, Take 1 capsule (50,000 Units total) by mouth every 7 (seven) days., Disp: 12 capsule, Rfl: 1  Review of Systems  Constitutional: Positive for fatigue. Negative for activity change, appetite change, chills, diaphoresis, fever and unexpected weight change.  HENT: Negative for congestion, postnasal drip, rhinorrhea, sinus pain, sinus pressure, sneezing, sore throat, tinnitus, trouble swallowing and voice change.   Respiratory: Positive for cough (clearing throat frequently). Negative for chest tightness, shortness of breath and wheezing.   Cardiovascular: Negative.   Musculoskeletal: Positive for back pain, gait problem and myalgias. Negative for arthralgias, joint swelling, neck pain and neck stiffness.  Neurological: Positive for weakness and numbness. Negative for dizziness and headaches.    Social History  Substance Use Topics  . Smoking status: Former Research scientist (life sciences)  . Smokeless tobacco: Never Used     Comment: Quit 2014  . Alcohol use No   Objective:   BP (!) 106/56 (BP Location: Right Arm, Patient Position: Sitting, Cuff Size: Large)   Pulse 76   Temp 98 F (36.7 C) (Oral)   Resp 16   Wt 177 lb (80.3 kg)   BMI 34.57 kg/m  Vitals:   08/09/16 1356  BP: (!) 106/56  Pulse: 76  Resp: 16    Temp: 98 F (36.7 C)  TempSrc: Oral  Weight: 177 lb (80.3 kg)     Physical Exam  Constitutional: She appears well-developed and well-nourished. No distress.  Neck: Normal range of motion. Neck supple. No tracheal deviation present. No thyromegaly present.  Cardiovascular: Normal rate, regular rhythm and normal heart sounds.  Exam reveals no gallop and no friction rub.   No murmur heard. Pulmonary/Chest: Effort normal and breath sounds normal. No respiratory distress. She has no wheezes. She has no rales.  Musculoskeletal:       Right hip: She exhibits normal range of motion, normal strength and no tenderness.       Right knee: Normal.       Right ankle: Normal.       Lumbar back: She exhibits decreased range of  motion and tenderness. She exhibits no bony tenderness and no spasm.  Lymphadenopathy:    She has no cervical adenopathy.  Skin: She is not diaphoretic.  Vitals reviewed.  CLINICAL DATA:  71 year old female with pain right lower back since 2001. Pain radiates to right hip down right leg. No injury.  EXAM: LUMBAR SPINE - COMPLETE 4+ VIEW  COMPARISON:  09/25/2007 MR. an 07/08/2015 chest x-ray.  FINDINGS: Fusion T10 and T11 vertebra.  Mild facet degenerative changes L3-4 through L5-S1. Minimal anterior slip L4. Minimal disc space narrowing L3-4 through L5-S1.  Vascular calcifications.  Prior left hip surgery.  Mild right hip joint degenerative changes.  IMPRESSION: Mild facet degenerative changes L3-4 through L5-S1. Minimal anterior slip L4. Minimal disc space narrowing L3-4 through L5-S1.   Electronically Signed   By: Genia Del M.D.   On: 08/09/2016 20:58    Assessment & Plan:     1. 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 Continue current medical treatment plan lantus 65 units BID and  jardiance 53m. I will see her back in 3 months for AWV/CPE and all labs. - POCT glycosylated hemoglobin (Hb A1C)  2. Chronic right-sided low back pain with right-sided sciatica Worsening symptoms of chronic issue. Patient already on gabapentin 604mTID and Norco [id number] get new xray since not imaged in many years to make sure no worsening bony abnormalities. Will also refer back to neurosurgery to see what other treatment options are available.  - DG Lumbar Spine Complete; Future - Ambulatory referral to Neurosurgery  3. Class 1 obesity due to excess calories with serious comorbidity and body mass index (BMI) of 34.0 to 34.9 in adult Patient use to walk for exercise until her acute on chronic low back pain with right lower extremity radiculopathy.  4. Essential hypertension Suspect cough/throat clearing to be secondary to lisinopril. Will change to losartan as below. Will recheck BP when she returns in 3 months.  - losartan (COZAAR) 50 MG tablet; Take 0.5 tablets (25 mg total) by mouth daily.  Dispense: 45 tablet; Refill: 3  [place]

## 2016-08-27 ENCOUNTER — Other Ambulatory Visit: Payer: Self-pay | Admitting: Physician Assistant

## 2016-08-27 DIAGNOSIS — M545 Low back pain, unspecified: Secondary | ICD-10-CM

## 2016-08-27 DIAGNOSIS — G8929 Other chronic pain: Secondary | ICD-10-CM

## 2016-08-27 MED ORDER — HYDROCODONE-ACETAMINOPHEN 10-325 MG PO TABS: 1.0000 | ORAL_TABLET | Freq: Three times a day (TID) | ORAL | 0 refills | Status: DC | PRN

## 2016-08-27 NOTE — Telephone Encounter (Signed)
Pt contacted office for refill request on the following medications: ° °HYDROcodone-acetaminophen (NORCO) 10-325 MG tablet ° °CB#[phone]/MW °

## 2016-08-27 NOTE — Telephone Encounter (Signed)
Rx printed

## 2016-08-27 NOTE — Telephone Encounter (Signed)
LM that prescription is placed up front ready for pick up.  Thanks,  -Joseline 

## 2016-09-02 ENCOUNTER — Telehealth: Payer: Self-pay | Admitting: Physician Assistant

## 2016-09-02 DIAGNOSIS — M545 Low back pain, unspecified: Secondary | ICD-10-CM

## 2016-09-02 DIAGNOSIS — G8929 Other chronic pain: Secondary | ICD-10-CM

## 2016-09-02 MED ORDER — HYDROCODONE-ACETAMINOPHEN 10-325 MG PO TABS: 1.0000 | ORAL_TABLET | Freq: Three times a day (TID) | ORAL | 0 refills | Status: DC | PRN

## 2016-09-02 NOTE — Telephone Encounter (Signed)
LMTCB  Thanks,  -Alvita Fana 

## 2016-09-02 NOTE — Telephone Encounter (Signed)
Pt wants to know if Sonia Baller can write several months Rx at time because it is hard for her or her daughter to get here.  She said they could be dated separately for each month.  She is coming to pick up [place] today and wants to know if Sonia Baller can do one for July and August also...   Pt call back [phone]  Thanks teri

## 2016-09-02 NOTE — Telephone Encounter (Signed)
I am sorry I do understand the circumstance but I only give one Rx at a time. I can not make an exception to this rule as if I do it for one I would have to change for others. I am only giving one month at a time.  Rx printed and can be picked up.

## 2016-09-17 ENCOUNTER — Telehealth: Payer: Self-pay | Admitting: Physician Assistant

## 2016-09-17 NOTE — Telephone Encounter (Signed)
Spoke with patient and scheduled her for Monday 09/20/16.  Thanks,  -Pawel Soules

## 2016-09-17 NOTE — Telephone Encounter (Signed)
Pt would like a nurse to call her back regarding continual UTI's  Call back is [phone]. Pt would like a call back before 11 am  Thanks teri

## 2016-09-20 ENCOUNTER — Ambulatory Visit: Payer: Self-pay | Admitting: Physician Assistant

## 2016-09-23 ENCOUNTER — Ambulatory Visit (INDEPENDENT_AMBULATORY_CARE_PROVIDER_SITE_OTHER): Payer: Medicare Other | Admitting: Physician Assistant

## 2016-09-23 ENCOUNTER — Encounter: Payer: Self-pay | Admitting: Physician Assistant

## 2016-09-23 VITALS — BP 116/54 | HR 80 | Temp 97.4°F | Resp 16 | Wt 173.0 lb

## 2016-09-23 DIAGNOSIS — N3090 Cystitis, unspecified without hematuria: Secondary | ICD-10-CM

## 2016-09-23 DIAGNOSIS — E113299 Type 2 diabetes mellitus with mild nonproliferative diabetic retinopathy without macular edema, unspecified eye: Secondary | ICD-10-CM | POA: Diagnosis not present

## 2016-09-23 DIAGNOSIS — M545 Low back pain, unspecified: Secondary | ICD-10-CM

## 2016-09-23 DIAGNOSIS — B3731 Acute candidiasis of vulva and vagina: Secondary | ICD-10-CM

## 2016-09-23 DIAGNOSIS — Z6833 Body mass index (BMI) 33.0-33.9, adult: Secondary | ICD-10-CM

## 2016-09-23 DIAGNOSIS — G8929 Other chronic pain: Secondary | ICD-10-CM

## 2016-09-23 DIAGNOSIS — J410 Simple chronic bronchitis: Secondary | ICD-10-CM

## 2016-09-23 DIAGNOSIS — I5022 Chronic systolic (congestive) heart failure: Secondary | ICD-10-CM

## 2016-09-23 DIAGNOSIS — B373 Candidiasis of vulva and vagina: Secondary | ICD-10-CM | POA: Diagnosis not present

## 2016-09-23 DIAGNOSIS — Z794 Long term (current) use of insulin: Secondary | ICD-10-CM

## 2016-09-23 LAB — POCT URINALYSIS DIPSTICK
BILIRUBIN UA: NEGATIVE
Glucose, UA: 2000
KETONES UA: NEGATIVE
Nitrite, UA: POSITIVE
PH UA: 6.5 (ref 5.0–8.0)
Protein, UA: NEGATIVE
SPEC GRAV UA: 1.01 (ref 1.010–1.025)
Urobilinogen, UA: 0.2 E.U./dL

## 2016-09-23 MED ORDER — HYDROCODONE-ACETAMINOPHEN 10-325 MG PO TABS: 1.0000 | ORAL_TABLET | Freq: Three times a day (TID) | ORAL | 0 refills | Status: DC | PRN

## 2016-09-23 MED ORDER — ALBUTEROL SULFATE HFA 108 (90 BASE) MCG/ACT IN AERS: 2.0000 | INHALATION_SPRAY | Freq: Four times a day (QID) | RESPIRATORY_TRACT | 2 refills | Status: DC | PRN

## 2016-09-23 MED ORDER — NITROFURANTOIN MONOHYD MACRO 100 MG PO CAPS: 100.0000 mg | ORAL_CAPSULE | Freq: Two times a day (BID) | ORAL | 0 refills | Status: DC

## 2016-09-23 NOTE — Progress Notes (Signed)
Patient: Andrea [name] Female    DOB: Jul 09, 1945   71 y.o.   MRN: [id number] Visit Date: 09/23/2016  Today's Provider: Mar Daring, PA-C   Chief Complaint  Patient presents with  . Urinary Tract Infection   Subjective:    Urinary Tract Infection   This is a new problem. Episode onset: "couple weeks" Quality: "pressure" The pain is mild. There has been no fever. Associated symptoms include a discharge (yeast), flank pain (improved), frequency and urgency. Pertinent negatives include no chills, hematuria, hesitancy, nausea, sweats or vomiting. She has tried nothing for the symptoms.   Pt is c/o skin lesions on bilateral upper legs. She denies insect bites. She states the lesions are blistering and itchy. She is unsure of what could have caused these sx.  She also continues to complain of recurrent yeast infections. She has been using terazol cream without relief. She is on Jardiance currently.     Allergies  Allergen Reactions  . Metformin And Related Diarrhea  . Sulfa Antibiotics Nausea And Vomiting     Current Outpatient Prescriptions:  .  albuterol (PROVENTIL HFA;VENTOLIN HFA) 108 (90 Base) MCG/ACT inhaler, Inhale 2 puffs into the lungs every 6 (six) hours as needed for wheezing or shortness of breath., Disp: 1 Inhaler, Rfl: 2 .  Alcohol Swabs (ALCOHOL PREP) PADS, Use twice daily prior to SQ injection of insulin to clean skin, Disp: 100 each, Rfl: 6 .  aspirin 81 MG tablet, Take 81 mg by mouth daily., Disp: , Rfl:  .  atorvastatin (LIPITOR) 80 MG tablet, Take 1 tablet (80 mg total) by mouth daily., Disp: 90 tablet, Rfl: 1 .  Blood Glucose Calibration (ONETOUCH VERIO) SOLN, , Disp: , Rfl:  .  Blood Glucose Monitoring Suppl (ONETOUCH VERIO IQ SYSTEM) w/Device KIT, , Disp: , Rfl:  .  Cyanocobalamin (B-12) 1000 MCG CAPS, Take 1,500 mcg by mouth., Disp: , Rfl:  .  empagliflozin (JARDIANCE) 25 MG TABS tablet, Take 25 mg by mouth daily., Disp: 90 tablet, Rfl: 3 .   gabapentin (NEURONTIN) 600 MG tablet, Take 1 tablet (600 mg total) by mouth 3 (three) times daily., Disp: 90 tablet, Rfl: 5 .  HYDROcodone-acetaminophen (NORCO) 10-325 MG tablet, Take 1 tablet by mouth every 8 (eight) hours as needed., Disp: 90 tablet, Rfl: 0 .  insulin glargine (LANTUS) 100 UNIT/ML injection, Inject 0.65 mLs (65 Units total) into the skin 2 (two) times daily with a meal., Disp: 10 mL, Rfl: 11 .  Insulin Syringe-Needle U-100 29G 1 ML MISC, Use to inject 65 units Lantus SQ BIDWC, Disp: 100 each, Rfl: 6 .  losartan (COZAAR) 50 MG tablet, Take 0.5 tablets (25 mg total) by mouth daily., Disp: 45 tablet, Rfl: 3 .  metoprolol succinate (TOPROL-XL) 25 MG 24 hr tablet, Take 0.5 tablets (12.5 mg total) by mouth daily., Disp: 45 tablet, Rfl: 3 .  Multiple Vitamins-Minerals (ICAPS AREDS 2 PO), Take 1 capsule by mouth daily., Disp: , Rfl:  .  Omega-3 Fatty Acids (FISH OIL) 1200 MG CAPS, Take by mouth., Disp: , Rfl:  .  omeprazole (PRILOSEC) 40 MG capsule, Take 1 capsule (40 mg total) by mouth daily., Disp: 90 capsule, Rfl: 3 .  ONETOUCH DELICA LANCETS 63J MISC, , Disp: , Rfl:  .  ONETOUCH VERIO test strip, To check blood sugar once daily, Disp: 100 each, Rfl: 3 .  oxybutynin (DITROPAN-XL) 10 MG 24 hr tablet, Take 1 tablet (10 mg total) by mouth at bedtime.,  Disp: 30 tablet, Rfl: 5 .  sertraline (ZOLOFT) 25 MG tablet, Take 1 tablet (25 mg total) by mouth daily., Disp: 30 tablet, Rfl: 5 .  terconazole (TERAZOL 3) 0.8 % vaginal cream, Place 1 applicator vaginally at bedtime., Disp: 20 g, Rfl: 3 .  Vitamin D, Ergocalciferol, (DRISDOL) 50000 units CAPS capsule, Take 1 capsule (50,000 Units total) by mouth every 7 (seven) days., Disp: 12 capsule, Rfl: 1  Review of Systems  Constitutional: Negative for chills.  Respiratory: Negative.   Cardiovascular: Negative.   Gastrointestinal: Negative for abdominal pain, nausea and vomiting.  Genitourinary: Positive for flank pain (improved), frequency and  urgency. Negative for hematuria and hesitancy.    Social History  Substance Use Topics  . Smoking status: Former Research scientist (life sciences)  . Smokeless tobacco: Never Used     Comment: Quit 2014  . Alcohol use No   Objective:   BP (!) 116/54 (BP Location: Left Arm, Patient Position: Sitting, Cuff Size: Normal)   Pulse 80   Temp (!) 97.4 F (36.3 C) (Oral)   Resp 16   Wt 173 lb (78.5 kg)   BMI 33.79 kg/m  Vitals:   09/23/16 1354  BP: (!) 116/54  Pulse: 80  Resp: 16  Temp: (!) 97.4 F (36.3 C)  TempSrc: Oral  Weight: 173 lb (78.5 kg)     Physical Exam  Constitutional: She is oriented to person, place, and time. She appears well-developed and well-nourished. No distress.  Cardiovascular: Normal rate, regular rhythm and normal heart sounds.  Exam reveals no gallop and no friction rub.   No murmur heard. Pulmonary/Chest: Effort normal. No respiratory distress. She has wheezes in the right upper field, the right middle field and the left upper field. She has no rales.  Abdominal: Soft. Normal appearance and bowel sounds are normal. She exhibits no distension and no mass. There is no hepatosplenomegaly. There is tenderness in the suprapubic area. There is no rebound, no guarding and no CVA tenderness.  Suprapubic tenderness  Neurological: She is alert and oriented to person, place, and time.  Skin: Skin is warm and dry. She is not diaphoretic.  Vitals reviewed.      Assessment & Plan:     1. Cystitis Worsening symptoms. UA positive. Will treat empirically with Macrobid. Continue to push fluids. Urine sent for culture. Will follow up pending C&S results. She is to call if symptoms do not improve or if they worsen.  - POCT urinalysis dipstick - Urine Culture - nitrofurantoin, macrocrystal-monohydrate, (MACROBID) 100 MG capsule; Take 1 capsule (100 mg total) by mouth 2 (two) times daily.  Dispense: 20 capsule; Refill: 0  2. BMI 33.0-33.9,adult Counseled patient on healthy lifestyle  modifications including dieting and exercise.   3. Simple chronic bronchitis (HCC) Stable. Diagnosis pulled for medication refill. Continue current medical treatment plan. - albuterol (PROVENTIL HFA;VENTOLIN HFA) 108 (90 Base) MCG/ACT inhaler; Inhale 2 puffs into the lungs every 6 (six) hours as needed for wheezing or shortness of breath.  Dispense: 1 Inhaler; Refill: 2  4. 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 We will recheck A1c when she returns in September 2018.   5. Chronic systolic heart failure ([place]) Medically managed and stable. No edema or weight change.   6. Chronic bilateral low back pain without sciatica Stable. Diagnosis pulled for medication refill. Continue current medical treatment plan. - HYDROcodone-acetaminophen (  NORCO) 10-325 MG tablet; Take 1 tablet by mouth every 8 (eight) hours as needed. Please do NOT fill until 10/01/16  Dispense: 90 tablet; Refill: 0  7. Yeast infection of the vagina See above medical treatment plan for #2.        Mar Daring, PA-C  [facility]

## 2016-09-26 LAB — URINE CULTURE

## 2016-10-13 ENCOUNTER — Other Ambulatory Visit: Payer: Self-pay | Admitting: Physician Assistant

## 2016-10-13 DIAGNOSIS — E1142 Type 2 diabetes mellitus with diabetic polyneuropathy: Secondary | ICD-10-CM

## 2016-10-13 MED ORDER — ONETOUCH DELICA LANCETS 33G MISC: 5 refills | Status: DC

## 2016-10-13 NOTE — Progress Notes (Signed)
Refill request for onetouch delica 18Z needles received and refilled to [facility].

## 2016-10-21 ENCOUNTER — Other Ambulatory Visit: Payer: Self-pay | Admitting: Neurosurgery

## 2016-10-21 DIAGNOSIS — M4316 Spondylolisthesis, lumbar region: Secondary | ICD-10-CM

## 2016-10-25 ENCOUNTER — Other Ambulatory Visit: Payer: Self-pay | Admitting: Physician Assistant

## 2016-10-25 DIAGNOSIS — B373 Candidiasis of vulva and vagina: Secondary | ICD-10-CM

## 2016-10-25 DIAGNOSIS — K219 Gastro-esophageal reflux disease without esophagitis: Secondary | ICD-10-CM

## 2016-10-25 DIAGNOSIS — B3731 Acute candidiasis of vulva and vagina: Secondary | ICD-10-CM

## 2016-10-25 MED ORDER — TERCONAZOLE 0.8 % VA CREA: 1.0000 | TOPICAL_CREAM | Freq: Every day | VAGINAL | 3 refills | Status: DC

## 2016-10-25 MED ORDER — OMEPRAZOLE 40 MG PO CPDR: 40.0000 mg | DELAYED_RELEASE_CAPSULE | Freq: Every day | ORAL | 3 refills | Status: DC

## 2016-10-25 NOTE — Telephone Encounter (Signed)
[facility] faxed a request for the following medication. Thanks CC  omeprazole (PRILOSEC) 40 MG capsule  >Take one capsule by mouth once daily forgerd/reflux.

## 2016-10-25 NOTE — Telephone Encounter (Signed)
[facility] faxed a request on the following medication. Thanks CC  terconazole (TERAZOL 3) 0.8 % vaginal cream  >Insert 1 applicatorful vaginally at bedtime.

## 2016-11-01 ENCOUNTER — Ambulatory Visit: Payer: Medicare Other

## 2016-11-04 ENCOUNTER — Ambulatory Visit
Admission: RE | Admit: 2016-11-04 | Discharge: 2016-11-04 | Disposition: A | Discharge status: Home / Self Care | Payer: Medicare Other | Source: Ambulatory Visit | Attending: Neurosurgery | Admitting: Neurosurgery

## 2016-11-04 ENCOUNTER — Encounter: Payer: Self-pay | Admitting: Physician Assistant

## 2016-11-04 ENCOUNTER — Ambulatory Visit (INDEPENDENT_AMBULATORY_CARE_PROVIDER_SITE_OTHER): Payer: Medicare Other | Admitting: Physician Assistant

## 2016-11-04 VITALS — BP 98/58 | HR 96 | Temp 98.2°F | Resp 16 | Wt 173.6 lb

## 2016-11-04 DIAGNOSIS — M1288 Other specific arthropathies, not elsewhere classified, other specified site: Secondary | ICD-10-CM | POA: Diagnosis not present

## 2016-11-04 DIAGNOSIS — M5127 Other intervertebral disc displacement, lumbosacral region: Secondary | ICD-10-CM | POA: Insufficient documentation

## 2016-11-04 DIAGNOSIS — M545 Low back pain, unspecified: Secondary | ICD-10-CM

## 2016-11-04 DIAGNOSIS — R350 Frequency of micturition: Secondary | ICD-10-CM | POA: Diagnosis not present

## 2016-11-04 DIAGNOSIS — M48061 Spinal stenosis, lumbar region without neurogenic claudication: Secondary | ICD-10-CM | POA: Insufficient documentation

## 2016-11-04 DIAGNOSIS — N3090 Cystitis, unspecified without hematuria: Secondary | ICD-10-CM

## 2016-11-04 DIAGNOSIS — G8929 Other chronic pain: Secondary | ICD-10-CM | POA: Diagnosis not present

## 2016-11-04 DIAGNOSIS — M5126 Other intervertebral disc displacement, lumbar region: Secondary | ICD-10-CM | POA: Insufficient documentation

## 2016-11-04 DIAGNOSIS — M4316 Spondylolisthesis, lumbar region: Secondary | ICD-10-CM | POA: Insufficient documentation

## 2016-11-04 LAB — POCT URINALYSIS DIPSTICK
Bilirubin, UA: NEGATIVE
GLUCOSE UA: NEGATIVE
KETONES UA: NEGATIVE
Nitrite, UA: POSITIVE
PH UA: 5 (ref 5.0–8.0)
Protein, UA: NEGATIVE
RBC UA: NEGATIVE
Urobilinogen, UA: 0.2 E.U./dL

## 2016-11-04 MED ORDER — NITROFURANTOIN MONOHYD MACRO 100 MG PO CAPS: 100.0000 mg | ORAL_CAPSULE | Freq: Two times a day (BID) | ORAL | 0 refills | Status: DC

## 2016-11-04 MED ORDER — HYDROCODONE-ACETAMINOPHEN 10-325 MG PO TABS: 1.0000 | ORAL_TABLET | Freq: Three times a day (TID) | ORAL | 0 refills | Status: DC | PRN

## 2016-11-04 NOTE — Progress Notes (Signed)
Patient: Andrea [name] Female    DOB: 02/15/1946   71 y.o.   MRN: [id number] Visit Date: 11/04/2016  Today's Provider: Mar Daring, PA-C   Chief Complaint  Patient presents with  . Urinary Tract Infection   Subjective:    Urinary Tract Infection   This is a new problem. The current episode started 1 to 4 weeks ago (3 weeks). The problem has been unchanged. The patient is experiencing no pain. There has been no fever. She is sexually active. There is no history of pyelonephritis. Associated symptoms include frequency and urgency. Pertinent negatives include no chills, discharge, flank pain, hematuria, nausea, possible pregnancy, sweats or vomiting. She has tried nothing for the symptoms.  She did have a UTI last month that was E.coli.      Allergies  Allergen Reactions  . Metformin And Related Diarrhea  . Sulfa Antibiotics Nausea And Vomiting     Current Outpatient Prescriptions:  .  albuterol (PROVENTIL HFA;VENTOLIN HFA) 108 (90 Base) MCG/ACT inhaler, Inhale 2 puffs into the lungs every 6 (six) hours as needed for wheezing or shortness of breath., Disp: 1 Inhaler, Rfl: 2 .  Alcohol Swabs (ALCOHOL PREP) PADS, Use twice daily prior to SQ injection of insulin to clean skin, Disp: 100 each, Rfl: 6 .  aspirin 81 MG tablet, Take 81 mg by mouth daily., Disp: , Rfl:  .  atorvastatin (LIPITOR) 80 MG tablet, Take 1 tablet (80 mg total) by mouth daily., Disp: 90 tablet, Rfl: 1 .  Blood Glucose Calibration (ONETOUCH VERIO) SOLN, , Disp: , Rfl:  .  Blood Glucose Monitoring Suppl (ONETOUCH VERIO IQ SYSTEM) w/Device KIT, , Disp: , Rfl:  .  Cyanocobalamin (B-12) 1000 MCG CAPS, Take 1,500 mcg by mouth., Disp: , Rfl:  .  gabapentin (NEURONTIN) 600 MG tablet, Take 1 tablet (600 mg total) by mouth 3 (three) times daily., Disp: 90 tablet, Rfl: 5 .  HYDROcodone-acetaminophen (NORCO) 10-325 MG tablet, Take 1 tablet by mouth every 8 (eight) hours as needed. Please do NOT fill until  10/01/16, Disp: 90 tablet, Rfl: 0 .  insulin glargine (LANTUS) 100 UNIT/ML injection, Inject 0.65 mLs (65 Units total) into the skin 2 (two) times daily with a meal., Disp: 10 mL, Rfl: 11 .  Insulin Syringe-Needle U-100 29G 1 ML MISC, Use to inject 65 units Lantus SQ BIDWC, Disp: 100 each, Rfl: 6 .  losartan (COZAAR) 50 MG tablet, Take 0.5 tablets (25 mg total) by mouth daily., Disp: 45 tablet, Rfl: 3 .  metoprolol succinate (TOPROL-XL) 25 MG 24 hr tablet, Take 0.5 tablets (12.5 mg total) by mouth daily., Disp: 45 tablet, Rfl: 3 .  Multiple Vitamins-Minerals (ICAPS AREDS 2 PO), Take 1 capsule by mouth daily., Disp: , Rfl:  .  nitrofurantoin, macrocrystal-monohydrate, (MACROBID) 100 MG capsule, Take 1 capsule (100 mg total) by mouth 2 (two) times daily., Disp: 20 capsule, Rfl: 0 .  Omega-3 Fatty Acids (FISH OIL) 1200 MG CAPS, Take by mouth., Disp: , Rfl:  .  omeprazole (PRILOSEC) 40 MG capsule, Take 1 capsule (40 mg total) by mouth daily., Disp: 90 capsule, Rfl: 3 .  ONETOUCH DELICA LANCETS 27X MISC, To check blood sugar daily, Disp: 100 each, Rfl: 5 .  ONETOUCH VERIO test strip, To check blood sugar once daily, Disp: 100 each, Rfl: 3 .  oxybutynin (DITROPAN-XL) 10 MG 24 hr tablet, Take 1 tablet (10 mg total) by mouth at bedtime., Disp: 30 tablet, Rfl: 5 .  sertraline (ZOLOFT) 25 MG tablet, Take 1 tablet (25 mg total) by mouth daily., Disp: 30 tablet, Rfl: 5 .  terconazole (TERAZOL 3) 0.8 % vaginal cream, Place 1 applicator vaginally at bedtime., Disp: 20 g, Rfl: 3 .  Vitamin D, Ergocalciferol, (DRISDOL) 50000 units CAPS capsule, Take 1 capsule (50,000 Units total) by mouth every 7 (seven) days., Disp: 12 capsule, Rfl: 1  Review of Systems  Constitutional: Negative for chills.  Respiratory: Negative for chest tightness, shortness of breath and wheezing.   Cardiovascular: Negative for chest pain, palpitations and leg swelling.  Gastrointestinal: Positive for abdominal pain (lower abdominal pain).  Negative for nausea and vomiting.  Genitourinary: Positive for frequency and urgency. Negative for dysuria, flank pain, hematuria, pelvic pain, vaginal bleeding, vaginal discharge and vaginal pain.    Social History  Substance Use Topics  . Smoking status: Former Research scientist (life sciences)  . Smokeless tobacco: Never Used     Comment: Quit 2014  . Alcohol use No   Objective:   BP (!) 98/58 (BP Location: Right Arm, Patient Position: Sitting, Cuff Size: Large)   Pulse 96   Temp 98.2 F (36.8 C) (Oral)   Resp 16   Wt 173 lb 9.6 oz (78.7 kg)   BMI 33.90 kg/m     Physical Exam  Constitutional: She is oriented to person, place, and time. She appears well-developed and well-nourished. No distress.  Cardiovascular: Normal rate, regular rhythm and normal heart sounds.  Exam reveals no gallop and no friction rub.   No murmur heard. Pulmonary/Chest: Effort normal and breath sounds normal. No respiratory distress. She has no wheezes. She has no rales.  Abdominal: Soft. Normal appearance and bowel sounds are normal. She exhibits no distension and no mass. There is no hepatosplenomegaly. There is tenderness in the suprapubic area. There is no rebound, no guarding and no CVA tenderness.  Neurological: She is alert and oriented to person, place, and time.  Skin: Skin is warm and dry. She is not diaphoretic.  Vitals reviewed.      Assessment & Plan:     1. Cystitis Worsening symptoms. UA positive. Will treat empirically with Macrobid as she has tolerated well and last culture showed sensitivity. Continue to push fluids. Urine sent for culture. Will follow up pending C&S results. She is to call if symptoms do not improve or if they worsen.  - nitrofurantoin, macrocrystal-monohydrate, (MACROBID) 100 MG capsule; Take 1 capsule (100 mg total) by mouth 2 (two) times daily.  Dispense: 20 capsule; Refill: 0  2. Frequency of urination See above medical treatment plan. - POCT urinalysis dipstick - Urine Culture  3.  Chronic bilateral low back pain without sciatica Stable. Diagnosis pulled for medication refill. Continue current medical treatment plan. - HYDROcodone-acetaminophen (NORCO) 10-325 MG tablet; Take 1 tablet by mouth every 8 (eight) hours as needed.  Dispense: 90 tablet; Refill: 0       Mar Daring, PA-C  [facility]

## 2016-11-04 NOTE — Patient Instructions (Signed)

## 2016-11-08 LAB — URINE CULTURE

## 2016-11-09 ENCOUNTER — Telehealth: Payer: Self-pay

## 2016-11-09 NOTE — Telephone Encounter (Signed)
-----   Message from Mar Daring, PA-C sent at 11/09/2016  8:25 AM EDT ----- Urine culture positive for e. Coli again. It is susceptible to the macrobid. Continue until completed.

## 2016-11-09 NOTE — Telephone Encounter (Signed)
Patient advised as directed below.  Thanks,  -Joseline 

## 2016-11-11 ENCOUNTER — Ambulatory Visit: Payer: Medicare Other

## 2016-11-16 ENCOUNTER — Ambulatory Visit: Payer: Medicare Other

## 2016-11-17 ENCOUNTER — Encounter: Payer: Medicare Other | Admitting: Physician Assistant

## 2016-11-29 ENCOUNTER — Telehealth: Payer: Self-pay | Admitting: Physician Assistant

## 2016-11-29 DIAGNOSIS — M545 Low back pain, unspecified: Secondary | ICD-10-CM

## 2016-11-29 DIAGNOSIS — G8929 Other chronic pain: Secondary | ICD-10-CM

## 2016-11-29 MED ORDER — HYDROCODONE-ACETAMINOPHEN 10-325 MG PO TABS: 1.0000 | ORAL_TABLET | Freq: Three times a day (TID) | ORAL | 0 refills | Status: DC | PRN

## 2016-11-29 NOTE — Telephone Encounter (Signed)
Pt contacted office for refill request on the following medications:  HYDROcodone-acetaminophen (NORCO) 10-325 MG tablet  CB#[phone]/MW

## 2016-11-29 NOTE — Telephone Encounter (Signed)
Please review

## 2016-11-29 NOTE — Telephone Encounter (Signed)
LM for patient that prescription is placed up front.  Thanks,  -Joseline

## 2016-11-29 NOTE — Telephone Encounter (Signed)
Rx printed

## 2016-12-01 ENCOUNTER — Other Ambulatory Visit: Payer: Self-pay | Admitting: Neurosurgery

## 2016-12-02 ENCOUNTER — Telehealth: Payer: Self-pay

## 2016-12-02 NOTE — Telephone Encounter (Signed)
Patient advised that the placard form is ready and placed up front ready for pick up.  Thanks,  -Joseline

## 2016-12-16 ENCOUNTER — Ambulatory Visit (INDEPENDENT_AMBULATORY_CARE_PROVIDER_SITE_OTHER): Payer: Medicare Other

## 2016-12-16 ENCOUNTER — Ambulatory Visit (INDEPENDENT_AMBULATORY_CARE_PROVIDER_SITE_OTHER): Payer: Medicare Other | Admitting: Physician Assistant

## 2016-12-16 VITALS — BP 122/52 | HR 72 | Temp 98.6°F | Ht 60.0 in | Wt 174.6 lb

## 2016-12-16 DIAGNOSIS — N390 Urinary tract infection, site not specified: Secondary | ICD-10-CM | POA: Diagnosis not present

## 2016-12-16 DIAGNOSIS — Z1159 Encounter for screening for other viral diseases: Secondary | ICD-10-CM | POA: Diagnosis not present

## 2016-12-16 DIAGNOSIS — E113299 Type 2 diabetes mellitus with mild nonproliferative diabetic retinopathy without macular edema, unspecified eye: Secondary | ICD-10-CM

## 2016-12-16 DIAGNOSIS — J410 Simple chronic bronchitis: Secondary | ICD-10-CM

## 2016-12-16 DIAGNOSIS — Z794 Long term (current) use of insulin: Secondary | ICD-10-CM | POA: Diagnosis not present

## 2016-12-16 DIAGNOSIS — I5022 Chronic systolic (congestive) heart failure: Secondary | ICD-10-CM

## 2016-12-16 DIAGNOSIS — E7800 Pure hypercholesterolemia, unspecified: Secondary | ICD-10-CM

## 2016-12-16 DIAGNOSIS — Z23 Encounter for immunization: Secondary | ICD-10-CM | POA: Diagnosis not present

## 2016-12-16 DIAGNOSIS — D519 Vitamin B12 deficiency anemia, unspecified: Secondary | ICD-10-CM

## 2016-12-16 DIAGNOSIS — Z1231 Encounter for screening mammogram for malignant neoplasm of breast: Secondary | ICD-10-CM

## 2016-12-16 DIAGNOSIS — Z0000 Encounter for general adult medical examination without abnormal findings: Secondary | ICD-10-CM

## 2016-12-16 DIAGNOSIS — Z6834 Body mass index (BMI) 34.0-34.9, adult: Secondary | ICD-10-CM | POA: Diagnosis not present

## 2016-12-16 DIAGNOSIS — Z1239 Encounter for other screening for malignant neoplasm of breast: Secondary | ICD-10-CM

## 2016-12-16 DIAGNOSIS — M810 Age-related osteoporosis without current pathological fracture: Secondary | ICD-10-CM | POA: Diagnosis not present

## 2016-12-16 DIAGNOSIS — R300 Dysuria: Secondary | ICD-10-CM

## 2016-12-16 LAB — POCT UA - MICROALBUMIN: MICROALBUMIN (UR) POC: 20 mg/L

## 2016-12-16 LAB — POCT URINALYSIS DIPSTICK
Bilirubin, UA: NEGATIVE
Glucose, UA: NEGATIVE
Ketones, UA: NEGATIVE
Nitrite, UA: NEGATIVE
PROTEIN UA: NEGATIVE
SPEC GRAV UA: 1.01 (ref 1.010–1.025)
Urobilinogen, UA: 0.2 E.U./dL
pH, UA: 6.5 (ref 5.0–8.0)

## 2016-12-16 MED ORDER — AMOXICILLIN-POT CLAVULANATE 875-125 MG PO TABS: 1.0000 | ORAL_TABLET | Freq: Two times a day (BID) | ORAL | 0 refills | Status: DC

## 2016-12-16 MED ORDER — NITROFURANTOIN MACROCRYSTAL 100 MG PO CAPS: 100.0000 mg | ORAL_CAPSULE | Freq: Every day | ORAL | 0 refills | Status: DC

## 2016-12-16 NOTE — Patient Instructions (Signed)
Andrea [name] , Thank you for taking time to come for your Medicare Wellness Visit. I appreciate your ongoing commitment to your health goals. Please review the following plan we discussed and let me know if I can assist you in the future.   Screening recommendations/referrals: Colonoscopy: Up to date Mammogram: Up to date Bone Density: discuss with PCP Recommended yearly ophthalmology/optometry visit for glaucoma screening and checkup Recommended yearly dental visit for hygiene and checkup  Vaccinations: Influenza vaccine: completed today Pneumococcal vaccine: completed series Tdap vaccine: declined Shingles vaccine: declined  Advanced directives: Advance directive discussed with you today. I have provided a copy for you to complete at home and have notarized. Once this is complete please bring a copy in to our office so we can scan it into your chart.  Conditions/risks identified: Obesity; Recommend decreasing caffeine by 2 glasses a dayand drinking 3 glasses of water a day as a subsitute.   Next appointment: 2:00 PM   Preventive Care 86 Years and Older, Female Preventive care refers to lifestyle choices and visits with your health care provider that can promote health and wellness. What does preventive care include?  A yearly physical exam. This is also called an annual well check.  Dental exams once or twice a year.  Routine eye exams. Ask your health care provider how often you should have your eyes checked.  Personal lifestyle choices, including:  Daily care of your teeth and gums.  Regular physical activity.  Eating a healthy diet.  Avoiding tobacco and drug use.  Limiting alcohol use.  Practicing safe sex.  Taking low-dose aspirin every day.  Taking vitamin and mineral supplements as recommended by your health care provider. What happens during an annual well check? The services and screenings done by your health care provider during your annual well check  will depend on your age, overall health, lifestyle risk factors, and family history of disease. Counseling  Your health care provider may ask you questions about your:  Alcohol use.  Tobacco use.  Drug use.  Emotional well-being.  Home and relationship well-being.  Sexual activity.  Eating habits.  History of falls.  Memory and ability to understand (cognition).  Work and work Statistician.  Reproductive health. Screening  You may have the following tests or measurements:  Height, weight, and BMI.  Blood pressure.  Lipid and cholesterol levels. These may be checked every 5 years, or more frequently if you are over 61 years old.  Skin check.  Lung cancer screening. You may have this screening every year starting at age 71 if you have a 30-pack-year history of smoking and currently smoke or have quit within the past 15 years.  Fecal occult blood test (FOBT) of the stool. You may have this test every year starting at age 2.  Flexible sigmoidoscopy or colonoscopy. You may have a sigmoidoscopy every 5 years or a colonoscopy every 10 years starting at age 43.  Hepatitis C blood test.  Hepatitis B blood test.  Sexually transmitted disease (STD) testing.  Diabetes screening. This is done by checking your blood sugar (glucose) after you have not eaten for a while (fasting). You may have this done every 1-3 years.  Bone density scan. This is done to screen for osteoporosis. You may have this done starting at age 56.  Mammogram. This may be done every 1-2 years. Talk to your health care provider about how often you should have regular mammograms. Talk with your health care provider about your test results,  treatment options, and if necessary, the need for more tests. Vaccines  Your health care provider may recommend certain vaccines, such as:  Influenza vaccine. This is recommended every year.  Tetanus, diphtheria, and acellular pertussis (Tdap, Td) vaccine. You may  need a Td booster every 10 years.  Zoster vaccine. You may need this after age 21.  Pneumococcal 13-valent conjugate (PCV13) vaccine. One dose is recommended after age 49.  Pneumococcal polysaccharide (PPSV23) vaccine. One dose is recommended after age 30. Talk to your health care provider about which screenings and vaccines you need and how often you need them. This information is not intended to replace advice given to you by your health care provider. Make sure you discuss any questions you have with your health care provider. Document Released: 03/21/2015 Document Revised: 11/12/2015 Document Reviewed: 12/24/2014 Elsevier Interactive Patient Education  2017 [place] Prevention in the Home Falls can cause injuries. They can happen to people of all ages. There are many things you can do to make your home safe and to help prevent falls. What can I do on the outside of my home?  Regularly fix the edges of walkways and driveways and fix any cracks.  Remove anything that might make you trip as you walk through a door, such as a raised step or threshold.  Trim any bushes or trees on the path to your home.  Use bright outdoor lighting.  Clear any walking paths of anything that might make someone trip, such as rocks or tools.  Regularly check to see if handrails are loose or broken. Make sure that both sides of any steps have handrails.  Any raised decks and porches should have guardrails on the edges.  Have any leaves, snow, or ice cleared regularly.  Use sand or salt on walking paths during winter.  Clean up any spills in your garage right away. This includes oil or grease spills. What can I do in the bathroom?  Use night lights.  Install grab bars by the toilet and in the tub and shower. Do not use towel bars as grab bars.  Use non-skid mats or decals in the tub or shower.  If you need to sit down in the shower, use a plastic, non-slip stool.  Keep the floor  dry. Clean up any water that spills on the floor as soon as it happens.  Remove soap buildup in the tub or shower regularly.  Attach bath mats securely with double-sided non-slip rug tape.  Do not have throw rugs and other things on the floor that can make you trip. What can I do in the bedroom?  Use night lights.  Make sure that you have a light by your bed that is easy to reach.  Do not use any sheets or blankets that are too big for your bed. They should not hang down onto the floor.  Have a firm chair that has side arms. You can use this for support while you get dressed.  Do not have throw rugs and other things on the floor that can make you trip. What can I do in the kitchen?  Clean up any spills right away.  Avoid walking on wet floors.  Keep items that you use a lot in easy-to-reach places.  If you need to reach something above you, use a strong step stool that has a grab bar.  Keep electrical cords out of the way.  Do not use floor polish or wax that makes floors  slippery. If you must use wax, use non-skid floor wax.  Do not have throw rugs and other things on the floor that can make you trip. What can I do with my stairs?  Do not leave any items on the stairs.  Make sure that there are handrails on both sides of the stairs and use them. Fix handrails that are broken or loose. Make sure that handrails are as long as the stairways.  Check any carpeting to make sure that it is firmly attached to the stairs. Fix any carpet that is loose or worn.  Avoid having throw rugs at the top or bottom of the stairs. If you do have throw rugs, attach them to the floor with carpet tape.  Make sure that you have a light switch at the top of the stairs and the bottom of the stairs. If you do not have them, ask someone to add them for you. What else can I do to help prevent falls?  Wear shoes that:  Do not have high heels.  Have rubber bottoms.  Are comfortable and fit you  well.  Are closed at the toe. Do not wear sandals.  If you use a stepladder:  Make sure that it is fully opened. Do not climb a closed stepladder.  Make sure that both sides of the stepladder are locked into place.  Ask someone to hold it for you, if possible.  Clearly mark and make sure that you can see:  Any grab bars or handrails.  First and last steps.  Where the edge of each step is.  Use tools that help you move around (mobility aids) if they are needed. These include:  Canes.  Walkers.  Scooters.  Crutches.  Turn on the lights when you go into a dark area. Replace any light bulbs as soon as they burn out.  Set up your furniture so you have a clear path. Avoid moving your furniture around.  If any of your floors are uneven, fix them.  If there are any pets around you, be aware of where they are.  Review your medicines with your doctor. Some medicines can make you feel dizzy. This can increase your chance of falling. Ask your doctor what other things that you can do to help prevent falls. This information is not intended to replace advice given to you by your health care provider. Make sure you discuss any questions you have with your health care provider. Document Released: 12/19/2008 Document Revised: 07/31/2015 Document Reviewed: 03/29/2014 Elsevier Interactive Patient Education  2017 [employer].

## 2016-12-16 NOTE — Progress Notes (Signed)
Patient: Andrea [name], Female    DOB: August 31, 1945, 71 y.o.   MRN: [id number] Visit Date: 12/16/2016  Today's Provider: Mar Daring, PA-C   No chief complaint on file.  Subjective:    Annual physical exam Andrea [name] is a 71 y.o. female who presents today for health maintenance and complete physical. She feels well. She reports exercising none. She reports she is sleeping well. -----------------------------------------------------------------  CPE:11/11/15 Mammogram:11/27/15 BI-RADS 1 BMD: 11/27/15 Recheck 11/2016 Colonoscopy:08/21/2015 Polyps-F/U 3-5 years  Review of Systems  Constitutional: Negative.        Irritability  HENT: Positive for drooling.   Eyes: Negative.   Respiratory: Positive for wheezing.   Cardiovascular: Negative.   Gastrointestinal: Negative.   Endocrine: Negative.   Genitourinary: Negative.   Musculoskeletal: Positive for arthralgias, back pain and gait problem.  Skin: Negative.   Allergic/Immunologic: Negative.   Hematological: Bruises/bleeds easily.  Psychiatric/Behavioral: Positive for agitation.    Social History      She  reports that she has quit smoking. She has never used smokeless tobacco. She reports that she does not drink alcohol or use drugs.       Social History   Social History  . Marital status: Single    Spouse name: N/A  . Number of children: N/A  . Years of education: N/A   Social History Main Topics  . Smoking status: Former Research scientist (life sciences)  . Smokeless tobacco: Never Used     Comment: Quit 2014  . Alcohol use No  . Drug use: No  . Sexual activity: Not on file   Other Topics Concern  . Not on file   Social History Narrative  . No narrative on file    Past Medical History:  Diagnosis Date  . Coronary artery disease   . Depression   . Diabetes mellitus without complication ([place])   . MI (myocardial infarction) ([place])   . Osteoporosis   . Pneumonia 2014  . Stroke ([place])   . TIA (transient  ischemic attack)      Patient Active Problem List   Diagnosis Date Noted  . Obese 04/16/2015  . History of colon polyps 11/23/2012  . Healed myocardial infarct 11/23/2012  . Bundle branch block, 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  . 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 Absolute anemia 08/17/2012  . Chronic obstructive pulmonary disease ([place]) 08/17/2012  . HLD (hyperlipidemia) 08/17/2012  . OP (osteoporosis) 08/17/2012  . Type 2 diabetes mellitus ([place]) 08/17/2012  . Chronic systolic heart failure ([place]) 07/30/2012  . 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 Angio plasty  1995  . HAND SURGERY Left 2012  . HIP FRACTURE SURGERY  2015    Family History        Family Status  Relation Status  . Daughter Alive  . Mother Deceased  . Sister Alive  . Brother Alive        Her family history includes Anxiety disorder in her daughter; Arthritis in her daughter; Asthma in her brother; Cataracts in her mother; Depression in her daughter and mother; Hypertension in her mother; Plantar fasciitis in her daughter; Stroke in her mother.     Allergies  Allergen Reactions  . Metformin And Related Diarrhea  . Sulfa Antibiotics Nausea And Vomiting     Current Outpatient Prescriptions:  .  albuterol (PROVENTIL HFA;VENTOLIN HFA) 108 (  90 Base) MCG/ACT inhaler, Inhale 2 puffs into the lungs every 6 (six) hours as needed for wheezing or shortness of breath., Disp: 1 Inhaler, Rfl: 2 .  Alcohol Swabs (ALCOHOL PREP) PADS, Use twice daily prior to SQ injection of insulin to clean skin, Disp: 100 each, Rfl: 6 .  aspirin 81 MG tablet, Take 81 mg by mouth daily., Disp: , Rfl:  .  atorvastatin (LIPITOR) 80 MG tablet, Take 1 tablet (80 mg total) by mouth daily., Disp: 90 tablet, Rfl: 1 .  Blood Glucose Calibration (ONETOUCH VERIO) SOLN, , Disp: , Rfl:  .   Blood Glucose Monitoring Suppl (ONETOUCH VERIO IQ SYSTEM) w/Device KIT, , Disp: , Rfl:  .  Cyanocobalamin (B-12) 1000 MCG CAPS, Take 1,500 mcg by mouth., Disp: , Rfl:  .  gabapentin (NEURONTIN) 600 MG tablet, Take 1 tablet (600 mg total) by mouth 3 (three) times daily., Disp: 90 tablet, Rfl: 5 .  HYDROcodone-acetaminophen (NORCO) 10-325 MG tablet, Take 1 tablet by mouth every 8 (eight) hours as needed., Disp: 90 tablet, Rfl: 0 .  insulin glargine (LANTUS) 100 UNIT/ML injection, Inject 0.65 mLs (65 Units total) into the skin 2 (two) times daily with a meal., Disp: 10 mL, Rfl: 11 .  Insulin Syringe-Needle U-100 29G 1 ML MISC, Use to inject 65 units Lantus SQ BIDWC, Disp: 100 each, Rfl: 6 .  losartan (COZAAR) 50 MG tablet, Take 0.5 tablets (25 mg total) by mouth daily., Disp: 45 tablet, Rfl: 3 .  metoprolol succinate (TOPROL-XL) 25 MG 24 hr tablet, Take 0.5 tablets (12.5 mg total) by mouth daily., Disp: 45 tablet, Rfl: 3 .  Multiple Vitamins-Minerals (ICAPS AREDS 2 PO), Take 1 capsule by mouth daily., Disp: , Rfl:  .  Omega-3 Fatty Acids (FISH OIL) 1200 MG CAPS, Take by mouth., Disp: , Rfl:  .  omeprazole (PRILOSEC) 40 MG capsule, Take 1 capsule (40 mg total) by mouth daily., Disp: 90 capsule, Rfl: 3 .  ONETOUCH DELICA LANCETS 25E MISC, To check blood sugar daily, Disp: 100 each, Rfl: 5 .  ONETOUCH VERIO test strip, To check blood sugar once daily, Disp: 100 each, Rfl: 3 .  oxybutynin (DITROPAN-XL) 10 MG 24 hr tablet, Take 1 tablet (10 mg total) by mouth at bedtime., Disp: 30 tablet, Rfl: 5 .  sertraline (ZOLOFT) 25 MG tablet, Take 1 tablet (25 mg total) by mouth daily., Disp: 30 tablet, Rfl: 5 .  terconazole (TERAZOL 3) 0.8 % vaginal cream, Place 1 applicator vaginally at bedtime. (Patient taking differently: Place 1 applicator vaginally at bedtime. ), Disp: 20 g, Rfl: 3 .  Vitamin D, Ergocalciferol, (DRISDOL) 50000 units CAPS capsule, Take 1 capsule (50,000 Units total) by mouth every 7 (seven) days.,  Disp: 12 capsule, Rfl: 1   Patient Care Team: Mar Daring, PA-C as PCP - General (Family Medicine) Dingeldein, Remo Lipps, MD as Consulting Physician (Ophthalmology) Newman Pies, MD as Consulting Physician (Neurosurgery)      Objective:  Vitals: BP (!) 122/52 (BP Location: Left Arm)   Pulse 72   Temp 98.6 F (37 C) (Oral)   Ht 5' (1.524 m)   Wt 174 lb 9.6 oz (79.2 kg)   BMI 34.10 kg/m   Body mass index is 34.1 kg/m.   Physical Exam  Constitutional: She is oriented to person, place, and time. She appears well-developed and well-nourished.  HENT:  Head: Normocephalic and atraumatic.  Right Ear: Hearing, tympanic membrane, external ear and ear canal normal.  Left Ear: Hearing, tympanic membrane, external  ear and ear canal normal.  Nose: Nose normal. Right sinus exhibits no maxillary sinus tenderness and no frontal sinus tenderness. Left sinus exhibits no maxillary sinus tenderness and no frontal sinus tenderness.  Mouth/Throat: Uvula is midline, oropharynx is clear and moist and mucous membranes are normal.  Eyes: Pupils are equal, round, and reactive to light. Conjunctivae and EOM are normal.  Neck: Normal range of motion. Neck supple. Carotid bruit is not present.  Cardiovascular: Normal rate, regular rhythm, normal heart sounds and intact distal pulses.   Pulmonary/Chest: Effort normal. She has no decreased breath sounds. She has wheezes in the right middle field and the left middle field. She has no rhonchi. She has no rales.  Abdominal: Soft. Bowel sounds are normal.  Genitourinary:  Genitourinary Comments: Deferred per patient  Musculoskeletal: Normal range of motion.  Neurological: She is alert and oriented to person, place, and time. She has normal reflexes.  Skin: Skin is warm and dry.  Psychiatric: She has a normal mood and affect. Her behavior is normal. Judgment and thought content normal.  Vitals reviewed.    Depression Screen PHQ 2/9 Scores 12/16/2016  12/16/2016 12/16/2016 11/11/2015  PHQ - 2 Score 0 0 0 5  PHQ- 9 Score 0 0 - 10    Diabetic Foot Exam - Simple   Simple Foot Form Diabetic Foot exam was performed with the following findings:  Yes 12/16/2016  1:27 PM  Visual Inspection No deformities, no ulcerations, no other skin breakdown bilaterally:  Yes Sensation Testing Intact to touch and monofilament testing bilaterally:  Yes Pulse Check Posterior Tibialis and Dorsalis pulse intact bilaterally:  Yes Comments     Assessment & Plan:     Routine Health Maintenance and Physical Exam  Exercise Activities and Dietary recommendations Goals    . Decrease soda or juice intake          Recommend decreasing caffeine by 2 glasses a dayand drinking 3 glasses of water a day as a subsitute.       Immunization History  Administered Date(s) Administered  . Influenza, High Dose Seasonal PF 02/12/2016, 12/16/2016    Health Maintenance  Topic Date Due  . Hepatitis C Screening  January 23, 1946  . TETANUS/TDAP  10/02/1964  . HEMOGLOBIN A1C  11/09/2016  . FOOT EXAM  11/10/2016  . DEXA SCAN  11/26/2016  . OPHTHALMOLOGY EXAM  07/19/2017  . MAMMOGRAM  11/26/2017  . COLONOSCOPY  08/21/2018  . INFLUENZA VACCINE  Completed  . PNA vac Low Risk Adult  Completed     Discussed health benefits of physical activity, and encouraged her to engage in regular exercise appropriate for her age and condition.    1. Annual physical exam Normal physical exam today. Will check labs as below and f/u pending lab results. If labs are stable and WNL she will not need to have these rechecked for one year at her next annual physical exam. She is to call the office in the meantime if she has any acute issue, questions or concerns. - TSH  2. Breast cancer screening Patient wants to await mammogram (last done at [facility]) until after her back surgery on 12/30/16. I will see her back in 3 months for T2DM and will consider ordering at that time if doing  well.   3. Chronic systolic heart failure (HCC) Stable. Continue Losartan, metoprolol.  - Comprehensive metabolic panel  4. Simple chronic bronchitis (HCC) Stable. - Comprehensive metabolic panel  5. 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 Patient on Losartan. A1c was 11.0. Will add Trulicity as below to Lantus 65 units BID. I will see her back in 3 months to recheck.  - POCT UA - Microalbumin - Comprehensive metabolic panel - Hemoglobin A1c - Dulaglutide (TRULICITY) 5.32 YE/3.3ID SOPN; Inject 0.75 mg into the skin once a week.  Dispense: 4 pen; Refill: 5  6. Age-related osteoporosis without current pathological fracture Was to have repeat BMD in one year due to high risk osteoporosis but patient wishes to wait until after her back surgery on 12/30/16 with Dr. Arnoldo Morale. I will see her back in 3 months for T2DM and will see if she wishes to have ordered then.  7. Pure hypercholesterolemia Stable. Continue Atorvastatin. Will check labs as below and f/u pending results. - Comprehensive metabolic panel - Lipid panel  8. Anemia due to vitamin B12 deficiency, unspecified B12 deficiency type H/O this. Will check labs as below and f/u pending results. - CBC with Differential/Platelet  9. BMI 34.0-34.9,adult Counseled patient on healthy lifestyle modifications including dieting and exercise.   10. Dysuria UA positive for leuks again. Has had E.coli positive cultures. Will treat with augmentin as below. Once treatment completed she is to start Macrobid once daily for prophylaxis x 3 months.  - POCT Urinalysis Dipstick - Urine Culture  11. Recurrent UTI See above medical treatment plan. - amoxicillin-clavulanate (AUGMENTIN) 875-125 MG tablet; Take 1 tablet by mouth 2 (two) times daily.  Dispense: 10 tablet; Refill: 0 - nitrofurantoin  (MACRODANTIN) 100 MG capsule; Take 1 capsule (100 mg total) by mouth at bedtime.  Dispense: 90 capsule; Refill: 0  12. Need for hepatitis C screening test - Hepatitis C antibody  --------------------------------------------------------------------    Mar Daring, PA-C  [facility]

## 2016-12-16 NOTE — Patient Instructions (Signed)
Health Maintenance for Postmenopausal Women Menopause is a normal process in which your reproductive ability comes to an end. This process happens gradually over a span of months to years, usually between the ages of 22 and 9. Menopause is complete when you have missed 12 consecutive menstrual periods. It is important to talk with your health care provider about some of the most common conditions that affect postmenopausal women, such as heart disease, cancer, and bone loss (osteoporosis). Adopting a healthy lifestyle and getting preventive care can help to promote your health and wellness. Those actions can also lower your chances of developing some of these common conditions. What should I know about menopause? During menopause, you may experience a number of symptoms, such as:  Moderate-to-severe hot flashes.  Night sweats.  Decrease in sex drive.  Mood swings.  Headaches.  Tiredness.  Irritability.  Memory problems.  Insomnia.  Choosing to treat or not to treat menopausal changes is an individual decision that you make with your health care provider. What should I know about hormone replacement therapy and supplements? Hormone therapy products are effective for treating symptoms that are associated with menopause, such as hot flashes and night sweats. Hormone replacement carries certain risks, especially as you become older. If you are thinking about using estrogen or estrogen with progestin treatments, discuss the benefits and risks with your health care provider. What should I know about heart disease and stroke? Heart disease, heart attack, and stroke become more likely as you age. This may be due, in part, to the hormonal changes that your body experiences during menopause. These can affect how your body processes dietary fats, triglycerides, and cholesterol. Heart attack and stroke are both medical emergencies. There are many things that you can do to help prevent heart disease  and stroke:  Have your blood pressure checked at least every 1-2 years. High blood pressure causes heart disease and increases the risk of stroke.  If you are 53-22 years old, ask your health care provider if you should take aspirin to prevent a heart attack or a stroke.  Do not use any tobacco products, including cigarettes, chewing tobacco, or electronic cigarettes. If you need help quitting, ask your health care provider.  It is important to eat a healthy diet and maintain a healthy weight. ? Be sure to include plenty of vegetables, fruits, low-fat dairy products, and lean protein. ? Avoid eating foods that are high in solid fats, added sugars, or salt (sodium).  Get regular exercise. This is one of the most important things that you can do for your health. ? Try to exercise for at least 150 minutes each week. The type of exercise that you do should increase your heart rate and make you sweat. This is known as moderate-intensity exercise. ? Try to do strengthening exercises at least twice each week. Do these in addition to the moderate-intensity exercise.  Know your numbers.Ask your health care provider to check your cholesterol and your blood glucose. Continue to have your blood tested as directed by your health care provider.  What should I know about cancer screening? There are several types of cancer. Take the following steps to reduce your risk and to catch any cancer development as early as possible. Breast Cancer  Practice breast self-awareness. ? This means understanding how your breasts normally appear and feel. ? It also means doing regular breast self-exams. Let your health care provider know about any changes, no matter how small.  If you are 40  or older, have a clinician do a breast exam (clinical breast exam or CBE) every year. Depending on your age, family history, and medical history, it may be recommended that you also have a yearly breast X-ray (mammogram).  If you  have a family history of breast cancer, talk with your health care provider about genetic screening.  If you are at high risk for breast cancer, talk with your health care provider about having an MRI and a mammogram every year.  Breast cancer (BRCA) gene test is recommended for women who have family members with BRCA-related cancers. Results of the assessment will determine the need for genetic counseling and BRCA1 and for BRCA2 testing. BRCA-related cancers include these types: ? Breast. This occurs in males or females. ? Ovarian. ? Tubal. This may also be called fallopian tube cancer. ? Cancer of the abdominal or pelvic lining (peritoneal cancer). ? Prostate. ? Pancreatic.  Cervical, Uterine, and Ovarian Cancer Your health care provider may recommend that you be screened regularly for cancer of the pelvic organs. These include your ovaries, uterus, and vagina. This screening involves a pelvic exam, which includes checking for microscopic changes to the surface of your cervix (Pap test).  For women ages 21-65, health care providers may recommend a pelvic exam and a Pap test every three years. For women ages 79-65, they may recommend the Pap test and pelvic exam, combined with testing for human papilloma virus (HPV), every five years. Some types of HPV increase your risk of cervical cancer. Testing for HPV may also be done on women of any age who have unclear Pap test results.  Other health care providers may not recommend any screening for nonpregnant women who are considered low risk for pelvic cancer and have no symptoms. Ask your health care provider if a screening pelvic exam is right for you.  If you have had past treatment for cervical cancer or a condition that could lead to cancer, you need Pap tests and screening for cancer for at least 20 years after your treatment. If Pap tests have been discontinued for you, your risk factors (such as having a new sexual partner) need to be  reassessed to determine if you should start having screenings again. Some women have medical problems that increase the chance of getting cervical cancer. In these cases, your health care provider may recommend that you have screening and Pap tests more often.  If you have a family history of uterine cancer or ovarian cancer, talk with your health care provider about genetic screening.  If you have vaginal bleeding after reaching menopause, tell your health care provider.  There are currently no reliable tests available to screen for ovarian cancer.  Lung Cancer Lung cancer screening is recommended for adults 69-62 years old who are at high risk for lung cancer because of a history of smoking. A yearly low-dose CT scan of the lungs is recommended if you:  Currently smoke.  Have a history of at least 30 pack-years of smoking and you currently smoke or have quit within the past 15 years. A pack-year is smoking an average of one pack of cigarettes per day for one year.  Yearly screening should:  Continue until it has been 15 years since you quit.  Stop if you develop a health problem that would prevent you from having lung cancer treatment.  Colorectal Cancer  This type of cancer can be detected and can often be prevented.  Routine colorectal cancer screening usually begins at  age 42 and continues through age 45.  If you have risk factors for colon cancer, your health care provider may recommend that you be screened at an earlier age.  If you have a family history of colorectal cancer, talk with your health care provider about genetic screening.  Your health care provider may also recommend using home test kits to check for hidden blood in your stool.  A small camera at the end of a tube can be used to examine your colon directly (sigmoidoscopy or colonoscopy). This is done to check for the earliest forms of colorectal cancer.  Direct examination of the colon should be repeated every  5-10 years until age 71. However, if early forms of precancerous polyps or small growths are found or if you have a family history or genetic risk for colorectal cancer, you may need to be screened more often.  Skin Cancer  Check your skin from head to toe regularly.  Monitor any moles. Be sure to tell your health care provider: ? About any new moles or changes in moles, especially if there is a change in a mole's shape or color. ? If you have a mole that is larger than the size of a pencil eraser.  If any of your family members has a history of skin cancer, especially at a young age, talk with your health care provider about genetic screening.  Always use sunscreen. Apply sunscreen liberally and repeatedly throughout the day.  Whenever you are outside, protect yourself by wearing long sleeves, pants, a wide-brimmed hat, and sunglasses.  What should I know about osteoporosis? Osteoporosis is a condition in which bone destruction happens more quickly than new bone creation. After menopause, you may be at an increased risk for osteoporosis. To help prevent osteoporosis or the bone fractures that can happen because of osteoporosis, the following is recommended:  If you are 46-71 years old, get at least 1,000 mg of calcium and at least 600 mg of vitamin D per day.  If you are older than age 55 but younger than age 65, get at least 1,200 mg of calcium and at least 600 mg of vitamin D per day.  If you are older than age 54, get at least 1,200 mg of calcium and at least 800 mg of vitamin D per day.  Smoking and excessive alcohol intake increase the risk of osteoporosis. Eat foods that are rich in calcium and vitamin D, and do weight-bearing exercises several times each week as directed by your health care provider. What should I know about how menopause affects my mental health? Depression may occur at any age, but it is more common as you become older. Common symptoms of depression  include:  Low or sad mood.  Changes in sleep patterns.  Changes in appetite or eating patterns.  Feeling an overall lack of motivation or enjoyment of activities that you previously enjoyed.  Frequent crying spells.  Talk with your health care provider if you think that you are experiencing depression. What should I know about immunizations? It is important that you get and maintain your immunizations. These include:  Tetanus, diphtheria, and pertussis (Tdap) booster vaccine.  Influenza every year before the flu season begins.  Pneumonia vaccine.  Shingles vaccine.  Your health care provider may also recommend other immunizations. This information is not intended to replace advice given to you by your health care provider. Make sure you discuss any questions you have with your health care provider. Document Released: 04/16/2005  Document Revised: 09/12/2015 Document Reviewed: 11/26/2014 Elsevier Interactive Patient Education  2018 [employer]  

## 2016-12-16 NOTE — Progress Notes (Signed)
Subjective:   Andrea [name] is a 71 y.o. female who presents for Medicare Annual (Subsequent) preventive examination.  Review of Systems:  N/A  Cardiac Risk Factors include: advanced age (>66mn, >>87women);dyslipidemia;hypertension;obesity (BMI >30kg/m2);diabetes mellitus     Objective:     Vitals: BP (!) 122/52 (BP Location: Left Arm)   Pulse 72   Temp 98.6 F (37 C) (Oral)   Ht 5' (1.524 m)   Wt 174 lb 9.6 oz (79.2 kg)   BMI 34.10 kg/m   Body mass index is 34.1 kg/m.   Tobacco History  Smoking Status  . Former Smoker  Smokeless Tobacco  . Never Used    Comment: Quit 2014     Counseling given: Not Answered   Past Medical History:  Diagnosis Date  . Coronary artery disease   . Depression   . Diabetes mellitus [place] complication [place]   . [place]) [place]   . Osteoporosis   . Pneumonia 2014  . Stroke ([place]   . TIA (transient ischemic attack)    Past Surgical History:  Procedure Laterality Date  . Angio plasty  1995  . HAND SURGERY Left 2012  . HIP FRACTURE SURGERY  2015   Family History  Problem Relation Age of Onset  . Depression Daughter   . Arthritis Daughter        spine  . Plantar fasciitis Daughter   . Anxiety disorder Daughter   . Hypertension Mother   . Stroke Mother   . Cataracts Mother   . Depression Mother   . Asthma Brother    History  Sexual Activity  . Sexual activity: Not on file    Outpatient Encounter Prescriptions as of 12/16/2016  Medication Sig  . albuterol (PROVENTIL HFA;VENTOLIN HFA) 108 (90 Base) MCG/ACT inhaler Inhale 2 puffs into the lungs every 6 (six) hours as needed for wheezing or shortness of breath.  . Alcohol Swabs (ALCOHOL PREP) PADS Use twice daily prior to SQ injection of insulin to clean skin  . aspirin 81 MG tablet Take 81 mg by mouth daily.  .[name] Kitchenatorvastatin (LIPITOR) 80 MG tablet Take 1 tablet (80 mg total) by mouth daily.  . Blood Glucose Calibration (ONETOUCH VERIO) SOLN   .  Blood Glucose Monitoring Suppl (ONETOUCH VERIO IQ SYSTEM) w/Device KIT   . Cyanocobalamin (B-12) 1000 MCG CAPS Take 1,500 mcg by mouth.  . gabapentin (NEURONTIN) 600 MG tablet Take 1 tablet (600 mg total) by mouth 3 (three) times daily.  .[name] KitchenHYDROcodone-acetaminophen (NORCO) 10-325 MG tablet Take 1 tablet by mouth every 8 (eight) hours as needed.  . insulin glargine (LANTUS) 100 UNIT/ML injection Inject 0.65 mLs (65 Units total) into the skin 2 (two) times daily with a meal.  . Insulin Syringe-Needle U-100 29G 1 ML MISC Use to inject 65 units Lantus SQ BIDWC  . losartan (COZAAR) 50 MG tablet Take 0.5 tablets (25 mg total) by mouth daily.  . metoprolol succinate (TOPROL-XL) 25 MG 24 hr tablet Take 0.5 tablets (12.5 mg total) by mouth daily.  . Multiple Vitamins-Minerals (ICAPS AREDS 2 PO) Take 1 capsule by mouth daily.  . Omega-3 Fatty Acids (FISH OIL) 1200 MG CAPS Take by mouth.  .[name] Kitchenomeprazole (PRILOSEC) 40 MG capsule Take 1 capsule (40 mg total) by mouth daily.  .Glory RosebushDELICA LANCETS 322QMISC To check blood sugar daily  . ONETOUCH VERIO test strip To check blood sugar once daily  . oxybutynin (DITROPAN-XL) 10 MG 24 hr tablet Take 1  tablet (10 mg total) by mouth at bedtime.  . sertraline (ZOLOFT) 25 MG tablet Take 1 tablet (25 mg total) by mouth daily.  [name] Kitchen terconazole (TERAZOL 3) 0.8 % vaginal cream Place 1 applicator vaginally at bedtime. (Patient taking differently: Place 1 applicator vaginally at bedtime. )  . Vitamin D, Ergocalciferol, (DRISDOL) 50000 units CAPS capsule Take 1 capsule (50,000 Units total) by mouth every 7 (seven) days.  . [DISCONTINUED] nitrofurantoin, macrocrystal-monohydrate, (MACROBID) 100 MG capsule Take 1 capsule (100 mg total) by mouth 2 (two) times daily.   No facility-administered encounter medications on file as of 12/16/2016.     Activities of Daily Living In your present state of health, do you have any difficulty performing the following activities: 12/16/2016    Hearing? N  Vision? N  Difficulty concentrating or making decisions? N  Walking or climbing stairs? Y  Comment due to back pain  Dressing or bathing? Y  Comment only bathing  Doing errands, shopping? N  Preparing Food and eating ? N  Using the Toilet? N  In the past six months, have you accidently leaked urine? N  Do you have problems with loss of bowel control? N  Managing your Medications? N  Managing your Finances? N  Housekeeping or managing your Housekeeping? N  Some recent data might be hidden    Patient Care Team: Rubye Beach as PCP - General (Family Medicine) Estill Cotta, MD as Consulting Physician (Ophthalmology) Newman Pies, MD as Consulting Physician (Neurosurgery)    Assessment:     Exercise Activities and Dietary recommendations Current Exercise Habits: The patient does not participate in regular exercise at present, Exercise limited by: orthopedic condition(s)  Goals    . Decrease soda or juice intake          Recommend decreasing caffeine by 2 glasses a dayand drinking 3 glasses of water a day as a subsitute.      Fall Risk Fall Risk  12/16/2016 11/11/2015  Falls in the past year? No No   Depression Screen PHQ 2/9 Scores 12/16/2016 12/16/2016 12/16/2016 11/11/2015  PHQ - 2 Score 0 0 0 5  PHQ- 9 Score 0 0 - 10     Cognitive Function     6CIT Screen 12/16/2016  What Year? 0 points  What month? 0 points  What time? 0 points  Count back from 20 0 points  Months in reverse 2 points  Repeat phrase 4 points  Total Score 6    Immunization History  Administered Date(s) Administered  . Influenza, High Dose Seasonal PF 02/12/2016, 12/16/2016   Screening Tests Health Maintenance  Topic Date Due  . Hepatitis C Screening  14-Jun-1945  . TETANUS/TDAP  10/02/1964  . HEMOGLOBIN A1C  11/09/2016  . FOOT EXAM  11/10/2016  . DEXA SCAN  11/26/2016  . OPHTHALMOLOGY EXAM  07/19/2017  . MAMMOGRAM  11/26/2017  . COLONOSCOPY   08/21/2018  . INFLUENZA VACCINE  Completed  . PNA vac Low Risk Adult  Completed      Plan:  I have personally reviewed and addressed the Medicare Annual Wellness questionnaire and have noted the following in the patient's chart:  A. Medical and social history B. Use of alcohol, tobacco or illicit drugs  C. Current medications and supplements D. Functional ability and status E.  Nutritional status F.  Physical activity G. Advance directives H. List of other physicians I.  Hospitalizations, surgeries, and ER visits in previous 12 months J.  [place] such as  hearing and vision if needed, cognitive and depression L. Referrals and appointments - none  In addition, I have reviewed and discussed with patient certain preventive protocols, quality metrics, and best practice recommendations. A written personalized care plan for preventive services as well as general preventive health recommendations were provided to patient.  See attached scanned questionnaire for additional information.   Signed,  Fabio Neighbors, LPN Nurse Health Advisor   MD Recommendations: Pt needs her Hgb A1c checked and a diabetic foot exam completed today. Pt declined the tetanus vaccine. Pt would like to follow up on previous DEXA report and discuss future orders for this.

## 2016-12-17 LAB — COMPLETE METABOLIC PANEL WITH GFR
AG Ratio: 1.4 (calc) (ref 1.0–2.5)
ALKALINE PHOSPHATASE (APISO): 151 U/L — AB (ref 33–130)
ALT: 21 U/L (ref 6–29)
AST: 19 U/L (ref 10–35)
Albumin: 4 g/dL (ref 3.6–5.1)
BILIRUBIN TOTAL: 0.7 mg/dL (ref 0.2–1.2)
BUN/Creatinine Ratio: 22 (calc) (ref 6–22)
BUN: 24 mg/dL (ref 7–25)
CHLORIDE: 102 mmol/L (ref 98–110)
CO2: 28 mmol/L (ref 20–32)
Calcium: 9.6 mg/dL (ref 8.6–10.4)
Creat: 1.08 mg/dL — ABNORMAL HIGH (ref 0.60–0.93)
GFR, Est African American: 60 mL/min/{1.73_m2} (ref >=60)
GFR, Est Non African American: 52 mL/min/{1.73_m2} — ABNORMAL LOW (ref >=60)
GLUCOSE: 77 mg/dL (ref 65–139)
Globulin: 2.9 g/dL (calc) (ref 1.9–3.7)
Potassium: 4.6 mmol/L (ref 3.5–5.3)
Sodium: 140 mmol/L (ref 135–146)
Total Protein: 6.9 g/dL (ref 6.1–8.1)

## 2016-12-17 LAB — TSH: TSH: 1.59 mIU/L (ref 0.40–4.50)

## 2016-12-17 LAB — CBC WITH DIFFERENTIAL/PLATELET
BASOS ABS: 63 {cells}/uL (ref 0–200)
Basophils Relative: 0.6 %
EOS ABS: 368 {cells}/uL (ref 15–500)
Eosinophils Relative: 3.5 %
HCT: 39.4 % (ref 35.0–45.0)
Hemoglobin: 12.9 g/dL (ref 11.7–15.5)
Lymphs Abs: 3360 cells/uL (ref 850–3900)
MCH: 29.3 pg (ref 27.0–33.0)
MCHC: 32.7 g/dL (ref 32.0–36.0)
MCV: 89.5 fL (ref 80.0–100.0)
MPV: 10.7 fL (ref 7.5–12.5)
Monocytes Relative: 6.1 %
NEUTROS PCT: 57.8 %
Neutro Abs: 6069 cells/uL (ref 1500–7800)
PLATELETS: 344 10*3/uL (ref 140–400)
RBC: 4.4 10*6/uL (ref 3.80–5.10)
RDW: 13.4 % (ref 11.0–15.0)
TOTAL LYMPHOCYTE: 32 %
WBC: 10.5 10*3/uL (ref 3.8–10.8)
WBCMIX: 641 {cells}/uL (ref 200–950)

## 2016-12-17 LAB — HEMOGLOBIN A1C
HEMOGLOBIN A1C: 11 %{Hb} — AB (ref <5.7)
MEAN PLASMA GLUCOSE: 269 (calc)
eAG (mmol/L): 14.9 (calc)

## 2016-12-17 LAB — HEPATITIS C ANTIBODY
HEP C AB: NONREACTIVE
SIGNAL TO CUT-OFF: 0.02 (ref <1.00)

## 2016-12-17 LAB — LIPID PANEL
CHOL/HDL RATIO: 2.1 (calc) (ref <5.0)
CHOLESTEROL: 144 mg/dL (ref <200)
HDL: 67 mg/dL (ref >50)
LDL Cholesterol (Calc): 56 mg/dL (calc)
NON-HDL CHOLESTEROL (CALC): 77 mg/dL (ref <130)
TRIGLYCERIDES: 133 mg/dL (ref <150)

## 2016-12-17 MED ORDER — DULAGLUTIDE 0.75 MG/0.5ML ~~LOC~~ SOAJ: 0.7500 mg | SUBCUTANEOUS | 5 refills | Status: DC

## 2016-12-19 LAB — URINE CULTURE
MICRO NUMBER:: 81134999
SPECIMEN QUALITY: ADEQUATE

## 2016-12-20 ENCOUNTER — Telehealth: Payer: Self-pay

## 2016-12-20 NOTE — Telephone Encounter (Signed)
Patient advised as below.  

## 2016-12-20 NOTE — Telephone Encounter (Signed)
-----   Message from Mar Daring, [place] sent at 12/17/2016 11:25 AM EDT ----- All labs are stable and WNL with exception of A1c which is up to 11.0. Continue lantus 65 units twice daily. I will send in Rx for the once weekly injectable as well just as we discussed. I will see you back in 3 months and recheck A1c. Depending on result will consider lowering Lantus dose at that time.

## 2016-12-24 ENCOUNTER — Other Ambulatory Visit: Payer: Self-pay | Admitting: Physician Assistant

## 2016-12-24 DIAGNOSIS — G8929 Other chronic pain: Secondary | ICD-10-CM

## 2016-12-24 DIAGNOSIS — M545 Low back pain, unspecified: Secondary | ICD-10-CM

## 2016-12-24 MED ORDER — HYDROCODONE-ACETAMINOPHEN 10-325 MG PO TABS: 1.0000 | ORAL_TABLET | Freq: Three times a day (TID) | ORAL | 0 refills | Status: DC | PRN

## 2016-12-24 NOTE — Progress Notes (Signed)
Norco refilled. Elys[facility] reviewed.

## 2016-12-27 NOTE — Pre-Procedure Instructions (Signed)
Andrea [name]  12/27/2016      [facility] - [place], [place] - [place] [address] Phone: [phone] Fax: [phone]    Your procedure is scheduled on October 25  Report to [facility] at [phone] A.M.  Call this number if you have problems the morning of surgery:  [phone]   Remember:  Do not eat food or drink liquids after midnight.  Continue all other medications as directed by your physician except follow these medication instructions before surgery   Take these medicines the morning of surgery with A SIP OF WATER  albuterol (PROVENTIL HFA;VENTOLIN HFA)  amoxicillin-clavulanate (AUGMENTIN)  gabapentin (NEURONTIN)  HYDROcodone-acetaminophen (NORCO) metoprolol succinate (TOPROL-XL)  omeprazole (PRILOSEC)  sertraline (ZOLOFT)   7 days prior to surgery STOP taking any Aspirin (unless otherwise instructed by your surgeon), Aleve, Naproxen, Ibuprofen, Motrin, Advil, Goody's, BC's, all herbal medications, fish oil, and all vitamins   WHAT DO I DO ABOUT MY DIABETES MEDICATION?   [name] Kitchen Do not take oral diabetes medicines (pills) the morning of surgery.  . THE NIGHT BEFORE SURGERY, take ____32_______ units of __insulin glargine (LANTUS)_________insulin.       . THE MORNING OF SURGERY, take ______32_______ units of __insulin glargine (LANTUS)________insulin.  . The day of surgery, do not take other diabetes injectables, including Byetta (exenatide), Bydureon (exenatide ER), Victoza (liraglutide), or Trulicity (dulaglutide).   How to Manage Your Diabetes Before and After Surgery  Why is it important to control my blood sugar before and after surgery? . Improving blood sugar levels before and after surgery helps healing and can limit problems. . A way of improving blood sugar control is eating a healthy diet by: o  Eating less sugar and carbohydrates o  Increasing activity/exercise o  Talking  with your doctor about reaching your blood sugar goals . High blood sugars (greater than 180 mg/dL) can raise your risk of infections and slow your recovery, so you will need to focus on controlling your diabetes during the weeks before surgery. . Make sure that the doctor who takes care of your diabetes knows about your planned surgery including the date and location.  How do I manage my blood sugar before surgery? . Check your blood sugar at least 4 times a day, starting 2 days before surgery, to make sure that the level is not too high or low. o Check your blood sugar the morning of your surgery when you wake up and every 2 hours until you get to the Short Stay unit. . If your blood sugar is less than 70 mg/dL, you will need to treat for low blood sugar: o Do not take insulin. o Treat a low blood sugar (less than 70 mg/dL) with  cup of clear juice (cranberry or apple), 4 glucose tablets, OR glucose gel. o Recheck blood sugar in 15 minutes after treatment (to make sure it is greater than 70 mg/dL). If your blood sugar is not greater than 70 mg/dL on recheck, call [phone] for further instructions. . Report your blood sugar to the short stay nurse when you get to Short Stay.  . If you are admitted to the hospital after surgery: o Your blood sugar will be checked by the staff and you will probably be given insulin after surgery (instead of oral diabetes medicines) to make sure you have good blood sugar levels. o The goal for blood sugar control after surgery is 80-180 mg/dL.  Do not wear jewelry, make-up or nail polish.  Do not wear lotions, powders, or perfumes, or deoderant.  Do not shave 48 hours prior to surgery.  Men may shave face and neck.  Do not bring valuables to the hospital.  [facility] is not responsible for any belongings or valuables.  Contacts, dentures or bridgework may not be worn into surgery.  Leave your suitcase in the car.  After surgery it may be brought to  your room.  For patients admitted to the hospital, discharge time will be determined by your treatment team.  Patients discharged the day of surgery will not be allowed to drive home.    Special instructions:   [facility]- Preparing For Surgery  Before surgery, you can play an important role. Because skin is not sterile, your skin needs to be as free of germs as possible. You can reduce the number of germs on your skin by washing with CHG (chlorahexidine gluconate) Soap before surgery.  CHG is an antiseptic cleaner which kills germs and bonds with the skin to continue killing germs even after washing.  Please do not use if you have an allergy to CHG or antibacterial soaps. If your skin becomes reddened/irritated stop using the CHG.  Do not shave (including legs and underarms) for at least 48 hours prior to first CHG shower. It is OK to shave your face.  Please follow these instructions carefully.   1. Shower the NIGHT BEFORE SURGERY and the MORNING OF SURGERY with CHG.   2. If you chose to wash your hair, wash your hair first as usual with your normal shampoo.  3. After you shampoo, rinse your hair and body thoroughly to remove the shampoo.  4. Use CHG as you would any other liquid soap. You can apply CHG directly to the skin and wash gently with a scrungie or a clean washcloth.   5. Apply the CHG Soap to your body ONLY FROM THE NECK DOWN.  Do not use on open wounds or open sores. Avoid contact with your eyes, ears, mouth and genitals (private parts). Wash Face and genitals (private parts)  with your normal soap.  6. Wash thoroughly, paying special attention to the area where your surgery will be performed.  7. Thoroughly rinse your body with warm water from the neck down.  8. DO NOT shower/wash with your normal soap after using and rinsing off the CHG Soap.  9. Pat yourself dry with a CLEAN TOWEL.  10. Wear CLEAN PAJAMAS to bed the night before surgery, wear comfortable clothes  the morning of surgery  11. Place CLEAN SHEETS on your bed the night of your first shower and DO NOT SLEEP WITH PETS.    Day of Surgery: Do not apply any deodorants/lotions. Please wear clean clothes to the hospital/surgery center.      Please read over the following fact sheets that you were given.

## 2016-12-28 ENCOUNTER — Encounter (HOSPITAL_COMMUNITY): Payer: Self-pay

## 2016-12-28 ENCOUNTER — Encounter (HOSPITAL_COMMUNITY)
Admission: RE | Admit: 2016-12-28 | Discharge: 2016-12-28 | Disposition: A | Discharge status: Home / Self Care | Payer: Medicare Other | Source: Ambulatory Visit | Attending: Neurosurgery | Admitting: Neurosurgery

## 2016-12-28 ENCOUNTER — Telehealth: Payer: Self-pay | Admitting: Physician Assistant

## 2016-12-28 DIAGNOSIS — E7800 Pure hypercholesterolemia, unspecified: Secondary | ICD-10-CM

## 2016-12-28 HISTORY — DX: Wheezing: R06.2

## 2016-12-28 HISTORY — DX: Gastro-esophageal reflux disease without esophagitis: K21.9

## 2016-12-28 HISTORY — DX: Bronchitis, not specified as acute or chronic: J40

## 2016-12-28 HISTORY — DX: Myoneural disorder, unspecified: G70.9

## 2016-12-28 HISTORY — DX: Urinary tract infection, site not specified: N39.0

## 2016-12-28 LAB — CBC
HEMATOCRIT: 43.5 % (ref 36.0–46.0)
Hemoglobin: 13.6 g/dL (ref 12.0–15.0)
MCH: 29.4 pg (ref 26.0–34.0)
MCHC: 31.3 g/dL (ref 30.0–36.0)
MCV: 94 fL (ref 78.0–100.0)
PLATELETS: 175 10*3/uL (ref 150–400)
RBC: 4.63 MIL/uL (ref 3.87–5.11)
RDW: 15 % (ref 11.5–15.5)
WBC: 6.1 10*3/uL (ref 4.0–10.5)

## 2016-12-28 LAB — BASIC METABOLIC PANEL
Anion gap: 7 (ref 5–15)
BUN: 16 mg/dL (ref 6–20)
CALCIUM: 9.5 mg/dL (ref 8.9–10.3)
CO2: 28 mmol/L (ref 22–32)
Chloride: 103 mmol/L (ref 101–111)
Creatinine, Ser: 0.97 mg/dL (ref 0.44–1.00)
GFR calc Af Amer: >60 mL/min (ref >60)
GFR, EST NON AFRICAN AMERICAN: 57 mL/min — AB (ref >60)
GLUCOSE: 203 mg/dL — AB (ref 65–99)
POTASSIUM: 4.4 mmol/L (ref 3.5–5.1)
SODIUM: 138 mmol/L (ref 135–145)

## 2016-12-28 LAB — SURGICAL PCR SCREEN
MRSA, PCR: NEGATIVE
STAPHYLOCOCCUS AUREUS: NEGATIVE

## 2016-12-28 LAB — GLUCOSE, CAPILLARY: GLUCOSE-CAPILLARY: 196 mg/dL — AB (ref 65–99)

## 2016-12-28 LAB — TYPE AND SCREEN
ABO/RH(D): A POS
Antibody Screen: NEGATIVE

## 2016-12-28 LAB — ABO/RH: ABO/RH(D): A POS

## 2016-12-28 NOTE — Telephone Encounter (Signed)
Andrea [name] faxed refill request for the following medication. Thanks CC  atorvastatin (LIPITOR) 80 MG tablet  >Take one tablet by mouth once daily for cholesterol.

## 2016-12-28 NOTE — Progress Notes (Signed)
PCP - [name] Cardiologist - Farrel Conners? At [facility]  Chest x-ray - not needed EKG - 04/30/79 Stress Test - denies ECHO - 05/2016 Cardiac Cath - cant remember maybe 1995    Fasting Blood Sugar - 120s Checks Blood Sugar __1___ times a day  Sending to anesthesia for review of EKG and a1c   Patient denies shortness of breath, fever, cough and chest pain at PAT appointment   Patient verbalized understanding of instructions that were given to them at the PAT appointment. Patient was also instructed that they will need to review over the PAT instructions again at home before surgery.

## 2016-12-29 MED ORDER — ATORVASTATIN CALCIUM 80 MG PO TABS: 80.0000 mg | ORAL_TABLET | Freq: Every day | ORAL | 1 refills | Status: DC

## 2016-12-29 NOTE — Progress Notes (Signed)
Anesthesia Chart Review:  Pt is a 71 year old female scheduled for L4-5 PLIF on 12/30/2016 with Newman Pies, MD  - PCP is [name], PA - Cardiologist is Wende Bushy, MD. Last office visit 05/27/16. F/u scheduled 01/25/17 with Dr. Ida Rogue.   PMH includes:  CAD (MI, PTCA ~1995), DM, stroke, TIA, GERD. Former smoker. BMI 33  Medications include: albuterol, augmentin (last dose 12/25/16), ASA 81mg , dulaglutide, lantus, losartan, metoprolol, prilosec  BP (!) 128/47   Pulse 78   Temp 36.7 C   Resp 20   Ht 5' (1.524 m)   Wt 170 lb 8 oz (77.3 kg)   SpO2 97%   BMI 33.30 kg/m    Preoperative labs reviewed.   - Glucose 203. HbA1c was 11.0 on 12/16/16, new medicine dulaglutide added. Pt reports fasting sugars now ~120.   EKG 04/29/16: sinus rhythm with 1st degree AV block. RBBB.   Echo 05/27/16:  - Left ventricle: Systolic function was normal. The estimated ejection fraction was in the range of 55% to 60%. Doppler parameters are consistent with abnormal left ventricular relaxation (grade 1 diastolic dysfunction).  Nuclear stress test 05/12/16:  - Pharmacological myocardial perfusion imaging study with no significant ischemia - Large region of severe fixed perfusion defect in the mid anterior wall extending to apical region, anteroseptal wall, apical, apical inferior region, consistent with previous MI - Large region of Wall motion abnormality noted in the region above EF estimated at 56% - No EKG changes concerning for ischemia at peak stress or in recovery. 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   Willeen Cass, FNP-BC [facility]/Anesthesiology Phone: [phone] 12/29/2016 11:53 AM

## 2016-12-29 NOTE — Telephone Encounter (Signed)
refilled 

## 2016-12-30 ENCOUNTER — Encounter (HOSPITAL_COMMUNITY)
Admission: RE | Disposition: A | Discharge status: Skilled Nursing Facility | Payer: Self-pay | Source: Ambulatory Visit | Attending: Neurosurgery

## 2016-12-30 ENCOUNTER — Inpatient Hospital Stay (HOSPITAL_COMMUNITY)
Admission: RE | Admit: 2016-12-30 | Discharge: 2017-01-05 | DRG: 455 | Disposition: A | Discharge status: Skilled Nursing Facility | Payer: Medicare Other | Source: Ambulatory Visit | Attending: Neurosurgery | Admitting: Neurosurgery

## 2016-12-30 ENCOUNTER — Inpatient Hospital Stay (HOSPITAL_COMMUNITY): Payer: Medicare Other | Admitting: Emergency Medicine

## 2016-12-30 ENCOUNTER — Inpatient Hospital Stay (HOSPITAL_COMMUNITY): Payer: Medicare Other

## 2016-12-30 ENCOUNTER — Encounter (HOSPITAL_COMMUNITY): Payer: Self-pay | Admitting: Certified Registered Nurse Anesthetist

## 2016-12-30 DIAGNOSIS — M810 Age-related osteoporosis without current pathological fracture: Secondary | ICD-10-CM | POA: Diagnosis present

## 2016-12-30 DIAGNOSIS — Z881 Allergy status to other antibiotic agents status: Secondary | ICD-10-CM

## 2016-12-30 DIAGNOSIS — I2510 Atherosclerotic heart disease of native coronary artery without angina pectoris: Secondary | ICD-10-CM | POA: Diagnosis present

## 2016-12-30 DIAGNOSIS — Z6833 Body mass index (BMI) 33.0-33.9, adult: Secondary | ICD-10-CM

## 2016-12-30 DIAGNOSIS — M48061 Spinal stenosis, lumbar region without neurogenic claudication: Secondary | ICD-10-CM | POA: Diagnosis present

## 2016-12-30 DIAGNOSIS — R0902 Hypoxemia: Secondary | ICD-10-CM | POA: Diagnosis not present

## 2016-12-30 DIAGNOSIS — K219 Gastro-esophageal reflux disease without esophagitis: Secondary | ICD-10-CM | POA: Diagnosis present

## 2016-12-30 DIAGNOSIS — M5416 Radiculopathy, lumbar region: Secondary | ICD-10-CM | POA: Diagnosis present

## 2016-12-30 DIAGNOSIS — R400 Somnolence: Secondary | ICD-10-CM | POA: Diagnosis not present

## 2016-12-30 DIAGNOSIS — Z888 Allergy status to other drugs, medicaments and biological substances status: Secondary | ICD-10-CM

## 2016-12-30 DIAGNOSIS — Z818 Family history of other mental and behavioral disorders: Secondary | ICD-10-CM

## 2016-12-30 DIAGNOSIS — Z794 Long term (current) use of insulin: Secondary | ICD-10-CM | POA: Diagnosis not present

## 2016-12-30 DIAGNOSIS — Z419 Encounter for procedure for purposes other than remedying health state, unspecified: Secondary | ICD-10-CM

## 2016-12-30 DIAGNOSIS — Z8673 Personal history of transient ischemic attack (TIA), and cerebral infarction without residual deficits: Secondary | ICD-10-CM

## 2016-12-30 DIAGNOSIS — E119 Type 2 diabetes mellitus without complications: Secondary | ICD-10-CM | POA: Diagnosis present

## 2016-12-30 DIAGNOSIS — F329 Major depressive disorder, single episode, unspecified: Secondary | ICD-10-CM | POA: Diagnosis present

## 2016-12-30 DIAGNOSIS — Z79899 Other long term (current) drug therapy: Secondary | ICD-10-CM

## 2016-12-30 DIAGNOSIS — Z87891 Personal history of nicotine dependence: Secondary | ICD-10-CM | POA: Diagnosis not present

## 2016-12-30 DIAGNOSIS — E669 Obesity, unspecified: Secondary | ICD-10-CM | POA: Diagnosis present

## 2016-12-30 DIAGNOSIS — N289 Disorder of kidney and ureter, unspecified: Secondary | ICD-10-CM | POA: Diagnosis present

## 2016-12-30 DIAGNOSIS — M4316 Spondylolisthesis, lumbar region: Principal | ICD-10-CM | POA: Diagnosis present

## 2016-12-30 DIAGNOSIS — Z7982 Long term (current) use of aspirin: Secondary | ICD-10-CM | POA: Diagnosis not present

## 2016-12-30 DIAGNOSIS — I252 Old myocardial infarction: Secondary | ICD-10-CM

## 2016-12-30 LAB — GLUCOSE, CAPILLARY
GLUCOSE-CAPILLARY: 109 mg/dL — AB (ref 65–99)
GLUCOSE-CAPILLARY: 111 mg/dL — AB (ref 65–99)
GLUCOSE-CAPILLARY: 318 mg/dL — AB (ref 65–99)
Glucose-Capillary: 306 mg/dL — ABNORMAL HIGH (ref 65–99)

## 2016-12-30 LAB — HEMOGLOBIN A1C
Hgb A1c MFr Bld: 10.4 % — ABNORMAL HIGH (ref 4.8–5.6)
MEAN PLASMA GLUCOSE: 251.78 mg/dL

## 2016-12-30 SURGERY — POSTERIOR LUMBAR FUSION 1 LEVEL
Anesthesia: General

## 2016-12-30 MED ORDER — PROPOFOL 10 MG/ML IV BOLUS
INTRAVENOUS | Status: AC
  Filled 2016-12-30: qty 20

## 2016-12-30 MED ORDER — BISACODYL 10 MG RE SUPP
10.0000 mg | Freq: Every day | RECTAL | Status: DC | PRN
  Administered 2017-01-03 – 2017-01-04 (×2): 10 mg via RECTAL
  Filled 2016-12-30 (×2): qty 1

## 2016-12-30 MED ORDER — ONDANSETRON HCL 4 MG/2ML IJ SOLN: 4.0000 mg | Freq: Once | INTRAMUSCULAR | Status: DC | PRN

## 2016-12-30 MED ORDER — ONDANSETRON HCL 4 MG/2ML IJ SOLN
INTRAMUSCULAR | Status: DC | PRN
Start: 2016-12-30 — End: 2016-12-30
  Administered 2016-12-30: 4 mg via INTRAVENOUS

## 2016-12-30 MED ORDER — ACETAMINOPHEN 325 MG PO TABS
650.0000 mg | ORAL_TABLET | ORAL | Status: DC | PRN
  Administered 2017-01-01 – 2017-01-04 (×6): 650 mg via ORAL
  Filled 2016-12-30 (×7): qty 2

## 2016-12-30 MED ORDER — CHLORHEXIDINE GLUCONATE CLOTH 2 % EX PADS: 6.0000 | MEDICATED_PAD | Freq: Once | CUTANEOUS | Status: DC

## 2016-12-30 MED ORDER — HYDROMORPHONE HCL 1 MG/ML IJ SOLN
INTRAMUSCULAR | Status: AC
  Filled 2016-12-30: qty 1

## 2016-12-30 MED ORDER — ATORVASTATIN CALCIUM 80 MG PO TABS
80.0000 mg | ORAL_TABLET | Freq: Every day | ORAL | Status: DC
  Administered 2016-12-30 – 2017-01-05 (×7): 80 mg via ORAL
  Filled 2016-12-30 (×7): qty 1

## 2016-12-30 MED ORDER — BUPIVACAINE LIPOSOME 1.3 % IJ SUSP
INTRAMUSCULAR | Status: DC | PRN
  Administered 2016-12-30: 20 mL

## 2016-12-30 MED ORDER — THROMBIN (RECOMBINANT) 20000 UNITS EX SOLR
CUTANEOUS | Status: DC | PRN
  Administered 2016-12-30: 13:00:00 via TOPICAL

## 2016-12-30 MED ORDER — DEXAMETHASONE SODIUM PHOSPHATE 10 MG/ML IJ SOLN
INTRAMUSCULAR | Status: DC | PRN
  Administered 2016-12-30: 4 mg via INTRAVENOUS

## 2016-12-30 MED ORDER — MORPHINE SULFATE (PF) 4 MG/ML IV SOLN
4.0000 mg | INTRAVENOUS | Status: DC | PRN
  Administered 2016-12-30: 4 mg via INTRAVENOUS
  Filled 2016-12-30: qty 1

## 2016-12-30 MED ORDER — GABAPENTIN 600 MG PO TABS
600.0000 mg | ORAL_TABLET | Freq: Three times a day (TID) | ORAL | Status: DC
  Administered 2016-12-30 – 2017-01-01 (×5): 600 mg via ORAL
  Filled 2016-12-30 (×5): qty 1

## 2016-12-30 MED ORDER — SUGAMMADEX SODIUM 200 MG/2ML IV SOLN
INTRAVENOUS | Status: DC | PRN
  Administered 2016-12-30: 200 mg via INTRAVENOUS

## 2016-12-30 MED ORDER — MENTHOL 3 MG MT LOZG: 1.0000 | LOZENGE | OROMUCOSAL | Status: DC | PRN

## 2016-12-30 MED ORDER — ROCURONIUM BROMIDE 100 MG/10ML IV SOLN
INTRAVENOUS | Status: DC | PRN
  Administered 2016-12-30: 10 mg via INTRAVENOUS
  Administered 2016-12-30: 40 mg via INTRAVENOUS

## 2016-12-30 MED ORDER — FENTANYL CITRATE (PF) 100 MCG/2ML IJ SOLN
25.0000 ug | INTRAMUSCULAR | Status: DC | PRN
  Administered 2016-12-30 (×2): 50 ug via INTRAVENOUS

## 2016-12-30 MED ORDER — FENTANYL CITRATE (PF) 250 MCG/5ML IJ SOLN
INTRAMUSCULAR | Status: AC
  Filled 2016-12-30: qty 5

## 2016-12-30 MED ORDER — ONDANSETRON HCL 4 MG/2ML IJ SOLN
INTRAMUSCULAR | Status: AC
  Filled 2016-12-30: qty 2

## 2016-12-30 MED ORDER — PROPOFOL 10 MG/ML IV BOLUS
INTRAVENOUS | Status: DC | PRN
  Administered 2016-12-30: 110 mg via INTRAVENOUS

## 2016-12-30 MED ORDER — PHENYLEPHRINE 40 MCG/ML (10ML) SYRINGE FOR IV PUSH (FOR BLOOD PRESSURE SUPPORT)
PREFILLED_SYRINGE | INTRAVENOUS | Status: AC
  Filled 2016-12-30: qty 10

## 2016-12-30 MED ORDER — ARTIFICIAL TEARS OPHTHALMIC OINT
TOPICAL_OINTMENT | OPHTHALMIC | Status: AC
  Filled 2016-12-30: qty 7

## 2016-12-30 MED ORDER — BACITRACIN ZINC 500 UNIT/GM EX OINT
TOPICAL_OINTMENT | CUTANEOUS | Status: AC
  Filled 2016-12-30: qty 28.35

## 2016-12-30 MED ORDER — METOPROLOL SUCCINATE ER 25 MG PO TB24
12.5000 mg | ORAL_TABLET | Freq: Every day | ORAL | Status: DC
  Administered 2016-12-31 – 2017-01-05 (×5): 12.5 mg via ORAL
  Filled 2016-12-30 (×6): qty 1

## 2016-12-30 MED ORDER — PHENYLEPHRINE HCL 10 MG/ML IJ SOLN
INTRAMUSCULAR | Status: DC | PRN
  Administered 2016-12-30 (×2): 80 ug via INTRAVENOUS
  Administered 2016-12-30: 50 ug via INTRAVENOUS
  Administered 2016-12-30 (×2): 80 ug via INTRAVENOUS

## 2016-12-30 MED ORDER — BUPIVACAINE-EPINEPHRINE (PF) 0.5% -1:200000 IJ SOLN
INTRAMUSCULAR | Status: AC
  Filled 2016-12-30: qty 30

## 2016-12-30 MED ORDER — ARTIFICIAL TEARS OPHTHALMIC OINT
TOPICAL_OINTMENT | OPHTHALMIC | Status: DC | PRN
  Administered 2016-12-30: 1 via OPHTHALMIC

## 2016-12-30 MED ORDER — LIDOCAINE HCL (CARDIAC) 20 MG/ML IV SOLN
INTRAVENOUS | Status: DC | PRN
  Administered 2016-12-30: 40 mg via INTRAVENOUS

## 2016-12-30 MED ORDER — LOSARTAN POTASSIUM 50 MG PO TABS
25.0000 mg | ORAL_TABLET | Freq: Every day | ORAL | Status: DC
  Administered 2017-01-01 – 2017-01-05 (×5): 25 mg via ORAL
  Filled 2016-12-30 (×8): qty 1

## 2016-12-30 MED ORDER — BACITRACIN ZINC 500 UNIT/GM EX OINT
TOPICAL_OINTMENT | CUTANEOUS | Status: DC | PRN
  Administered 2016-12-30: 1 via TOPICAL

## 2016-12-30 MED ORDER — INSULIN GLARGINE 100 UNIT/ML ~~LOC~~ SOLN
65.0000 [IU] | Freq: Two times a day (BID) | SUBCUTANEOUS | Status: DC
  Administered 2016-12-31 – 2017-01-05 (×9): 65 [IU] via SUBCUTANEOUS
  Filled 2016-12-30 (×13): qty 0.65

## 2016-12-30 MED ORDER — HYDROCODONE-ACETAMINOPHEN 10-325 MG PO TABS
1.0000 | ORAL_TABLET | ORAL | Status: DC | PRN
  Administered 2016-12-31 – 2017-01-05 (×10): 1 via ORAL
  Filled 2016-12-30 (×10): qty 1

## 2016-12-30 MED ORDER — ALBUTEROL SULFATE (2.5 MG/3ML) 0.083% IN NEBU
2.5000 mg | INHALATION_SOLUTION | Freq: Four times a day (QID) | RESPIRATORY_TRACT | Status: DC | PRN
  Administered 2017-01-01: 2.5 mg via RESPIRATORY_TRACT
  Filled 2016-12-30: qty 3

## 2016-12-30 MED ORDER — HYDROMORPHONE HCL 1 MG/ML IJ SOLN
0.2500 mg | INTRAMUSCULAR | Status: DC | PRN
  Administered 2016-12-30 (×4): 0.5 mg via INTRAVENOUS

## 2016-12-30 MED ORDER — SERTRALINE HCL 50 MG PO TABS
25.0000 mg | ORAL_TABLET | Freq: Every day | ORAL | Status: DC
  Administered 2016-12-31 – 2017-01-05 (×5): 25 mg via ORAL
  Filled 2016-12-30 (×6): qty 1

## 2016-12-30 MED ORDER — PHENYLEPHRINE HCL 10 MG/ML IJ SOLN
INTRAVENOUS | Status: DC | PRN
  Administered 2016-12-30: 40 ug/min via INTRAVENOUS

## 2016-12-30 MED ORDER — LACTATED RINGERS IV SOLN
INTRAVENOUS | Status: DC
  Administered 2016-12-30 (×3): via INTRAVENOUS

## 2016-12-30 MED ORDER — OXYCODONE HCL 5 MG/5ML PO SOLN: 5.0000 mg | Freq: Once | ORAL | Status: AC | PRN

## 2016-12-30 MED ORDER — ACETAMINOPHEN 650 MG RE SUPP: 650.0000 mg | RECTAL | Status: DC | PRN

## 2016-12-30 MED ORDER — MIDAZOLAM HCL 5 MG/5ML IJ SOLN
INTRAMUSCULAR | Status: DC | PRN
  Administered 2016-12-30: 1 mg via INTRAVENOUS

## 2016-12-30 MED ORDER — CEFAZOLIN SODIUM-DEXTROSE 2-4 GM/100ML-% IV SOLN
2.0000 g | Freq: Three times a day (TID) | INTRAVENOUS | Status: AC
  Administered 2016-12-30 – 2016-12-31 (×2): 2 g via INTRAVENOUS
  Filled 2016-12-30 (×2): qty 100

## 2016-12-30 MED ORDER — VANCOMYCIN HCL 1000 MG IV SOLR
INTRAVENOUS | Status: DC | PRN
  Administered 2016-12-30: 1000 mg via TOPICAL

## 2016-12-30 MED ORDER — ALBUMIN HUMAN 5 % IV SOLN
INTRAVENOUS | Status: DC | PRN
  Administered 2016-12-30: 13:00:00 via INTRAVENOUS

## 2016-12-30 MED ORDER — SODIUM CHLORIDE 0.9% FLUSH
3.0000 mL | Freq: Two times a day (BID) | INTRAVENOUS | Status: DC
  Administered 2016-12-31 – 2017-01-05 (×8): 3 mL via INTRAVENOUS

## 2016-12-30 MED ORDER — CEFAZOLIN SODIUM-DEXTROSE 2-4 GM/100ML-% IV SOLN
2.0000 g | INTRAVENOUS | Status: AC
  Administered 2016-12-30: 2 g via INTRAVENOUS
  Filled 2016-12-30: qty 100

## 2016-12-30 MED ORDER — VANCOMYCIN HCL 1000 MG IV SOLR
INTRAVENOUS | Status: AC
  Filled 2016-12-30: qty 1000

## 2016-12-30 MED ORDER — THROMBIN (RECOMBINANT) 20000 UNITS EX SOLR
CUTANEOUS | Status: DC | PRN
  Administered 2016-12-30: 20000 [IU] via TOPICAL

## 2016-12-30 MED ORDER — CYCLOBENZAPRINE HCL 10 MG PO TABS
10.0000 mg | ORAL_TABLET | Freq: Three times a day (TID) | ORAL | Status: DC | PRN
  Administered 2016-12-30 – 2017-01-01 (×3): 10 mg via ORAL
  Filled 2016-12-30 (×2): qty 1

## 2016-12-30 MED ORDER — BUPIVACAINE LIPOSOME 1.3 % IJ SUSP
20.0000 mL | Freq: Once | INTRAMUSCULAR | Status: DC
  Filled 2016-12-30: qty 20

## 2016-12-30 MED ORDER — ZOLPIDEM TARTRATE 5 MG PO TABS
5.0000 mg | ORAL_TABLET | Freq: Every evening | ORAL | Status: DC | PRN
  Filled 2016-12-30: qty 1

## 2016-12-30 MED ORDER — THROMBIN (RECOMBINANT) 20000 UNITS EX SOLR
CUTANEOUS | Status: AC
  Filled 2016-12-30: qty 20000

## 2016-12-30 MED ORDER — PHENOL 1.4 % MT LIQD: 1.0000 | OROMUCOSAL | Status: DC | PRN

## 2016-12-30 MED ORDER — OXYCODONE HCL 5 MG PO TABS
5.0000 mg | ORAL_TABLET | ORAL | Status: DC | PRN
  Administered 2016-12-31 – 2017-01-05 (×4): 5 mg via ORAL
  Filled 2016-12-30 (×4): qty 1

## 2016-12-30 MED ORDER — BUPIVACAINE-EPINEPHRINE (PF) 0.5% -1:200000 IJ SOLN
INTRAMUSCULAR | Status: DC | PRN
  Administered 2016-12-30: 20 mL

## 2016-12-30 MED ORDER — CYCLOBENZAPRINE HCL 10 MG PO TABS
ORAL_TABLET | ORAL | Status: AC
  Filled 2016-12-30: qty 1

## 2016-12-30 MED ORDER — DEXAMETHASONE SODIUM PHOSPHATE 10 MG/ML IJ SOLN
INTRAMUSCULAR | Status: AC
  Filled 2016-12-30: qty 1

## 2016-12-30 MED ORDER — DOCUSATE SODIUM 100 MG PO CAPS
100.0000 mg | ORAL_CAPSULE | Freq: Two times a day (BID) | ORAL | Status: DC
  Administered 2016-12-30 – 2017-01-05 (×12): 100 mg via ORAL
  Filled 2016-12-30 (×12): qty 1

## 2016-12-30 MED ORDER — BACITRACIN 50000 UNITS IM SOLR
INTRAMUSCULAR | Status: DC | PRN
Start: 2016-12-30 — End: 2016-12-30
  Administered 2016-12-30: 13:00:00

## 2016-12-30 MED ORDER — EPHEDRINE SULFATE 50 MG/ML IJ SOLN
INTRAMUSCULAR | Status: DC | PRN
  Administered 2016-12-30: 10 mg via INTRAVENOUS

## 2016-12-30 MED ORDER — ONDANSETRON HCL 4 MG/2ML IJ SOLN: 4.0000 mg | Freq: Four times a day (QID) | INTRAMUSCULAR | Status: DC | PRN

## 2016-12-30 MED ORDER — CEFAZOLIN SODIUM-DEXTROSE 2-4 GM/100ML-% IV SOLN: 2.0000 g | INTRAVENOUS | Status: DC

## 2016-12-30 MED ORDER — VITAMIN D (ERGOCALCIFEROL) 1.25 MG (50000 UNIT) PO CAPS
50000.0000 [IU] | ORAL_CAPSULE | ORAL | Status: DC
  Administered 2016-12-31: 50000 [IU] via ORAL
  Filled 2016-12-30: qty 1

## 2016-12-30 MED ORDER — PANTOPRAZOLE SODIUM 40 MG PO TBEC
80.0000 mg | DELAYED_RELEASE_TABLET | Freq: Every day | ORAL | Status: DC
  Administered 2016-12-31 – 2017-01-05 (×6): 80 mg via ORAL
  Filled 2016-12-30 (×6): qty 2

## 2016-12-30 MED ORDER — INSULIN ASPART 100 UNIT/ML ~~LOC~~ SOLN
0.0000 [IU] | SUBCUTANEOUS | Status: DC
  Administered 2016-12-30: 15 [IU] via SUBCUTANEOUS
  Administered 2016-12-31: 7 [IU] via SUBCUTANEOUS
  Administered 2016-12-31: 15 [IU] via SUBCUTANEOUS
  Administered 2016-12-31: 7 [IU] via SUBCUTANEOUS
  Administered 2016-12-31 (×3): 15 [IU] via SUBCUTANEOUS
  Administered 2017-01-01 (×5): 4 [IU] via SUBCUTANEOUS
  Administered 2017-01-01: 7 [IU] via SUBCUTANEOUS
  Administered 2017-01-02: 15 [IU] via SUBCUTANEOUS
  Administered 2017-01-02 (×2): 11 [IU] via SUBCUTANEOUS
  Administered 2017-01-03: 4 [IU] via SUBCUTANEOUS
  Administered 2017-01-03: 3 [IU] via SUBCUTANEOUS
  Administered 2017-01-03: 15 [IU] via SUBCUTANEOUS
  Administered 2017-01-03 – 2017-01-04 (×2): 4 [IU] via SUBCUTANEOUS
  Administered 2017-01-04: 7 [IU] via SUBCUTANEOUS
  Administered 2017-01-04: 4 [IU] via SUBCUTANEOUS
  Administered 2017-01-05: 3 [IU] via SUBCUTANEOUS

## 2016-12-30 MED ORDER — FENTANYL CITRATE (PF) 100 MCG/2ML IJ SOLN
INTRAMUSCULAR | Status: AC
  Filled 2016-12-30: qty 2

## 2016-12-30 MED ORDER — FENTANYL CITRATE (PF) 100 MCG/2ML IJ SOLN
INTRAMUSCULAR | Status: DC | PRN
  Administered 2016-12-30 (×5): 50 ug via INTRAVENOUS

## 2016-12-30 MED ORDER — SODIUM CHLORIDE 0.9% FLUSH: 3.0000 mL | INTRAVENOUS | Status: DC | PRN

## 2016-12-30 MED ORDER — OXYCODONE HCL 5 MG PO TABS
5.0000 mg | ORAL_TABLET | Freq: Once | ORAL | Status: AC | PRN
  Administered 2016-12-30: 5 mg via ORAL

## 2016-12-30 MED ORDER — OXYCODONE HCL 5 MG PO TABS
ORAL_TABLET | ORAL | Status: AC
  Filled 2016-12-30: qty 1

## 2016-12-30 MED ORDER — MIDAZOLAM HCL 2 MG/2ML IJ SOLN
INTRAMUSCULAR | Status: AC
  Filled 2016-12-30: qty 2

## 2016-12-30 MED ORDER — ONDANSETRON HCL 4 MG PO TABS: 4.0000 mg | ORAL_TABLET | Freq: Four times a day (QID) | ORAL | Status: DC | PRN

## 2016-12-30 MED ORDER — OXYBUTYNIN CHLORIDE ER 10 MG PO TB24
10.0000 mg | ORAL_TABLET | Freq: Every day | ORAL | Status: DC
  Administered 2016-12-31 – 2017-01-04 (×5): 10 mg via ORAL
  Filled 2016-12-30 (×6): qty 1

## 2016-12-30 MED ORDER — SODIUM CHLORIDE 0.9 % IV SOLN
250.0000 mL | INTRAVENOUS | Status: DC
  Administered 2017-01-02: 250 mL via INTRAVENOUS

## 2016-12-30 MED ORDER — OXYCODONE HCL 5 MG PO TABS
10.0000 mg | ORAL_TABLET | ORAL | Status: DC | PRN
  Administered 2016-12-31 – 2017-01-01 (×2): 10 mg via ORAL
  Filled 2016-12-30 (×2): qty 2

## 2016-12-30 MED ORDER — DULAGLUTIDE 0.75 MG/0.5ML ~~LOC~~ SOAJ
0.7500 mg | SUBCUTANEOUS | Status: DC
  Administered 2017-01-03: 0.75 mg via SUBCUTANEOUS
  Filled 2016-12-30 (×2): qty 1

## 2016-12-30 MED FILL — Sodium Chloride IV Soln 0.9%: INTRAVENOUS | Qty: 1000 | Status: AC

## 2016-12-30 MED FILL — Heparin Sodium (Porcine) Inj 1000 Unit/ML: INTRAMUSCULAR | Qty: 30 | Status: AC

## 2016-12-30 SURGICAL SUPPLY — 61 items
BAG DECANTER FOR FLEXI CONT (MISCELLANEOUS) ×2 IMPLANT
BASKET BONE COLLECTION (BASKET) ×2 IMPLANT
BENZOIN TINCTURE PRP APPL 2/3 (GAUZE/BANDAGES/DRESSINGS) ×2 IMPLANT
BLADE CLIPPER SURG (BLADE) IMPLANT
BUR MATCHSTICK NEURO 3.0 LAGG (BURR) ×2 IMPLANT
BUR PRECISION FLUTE 6.0 (BURR) ×2 IMPLANT
CANISTER SUCT 3000ML PPV (MISCELLANEOUS) ×2 IMPLANT
CAP REVERE LOCKING (Cap) ×8 IMPLANT
CARTRIDGE OIL MAESTRO DRILL (MISCELLANEOUS) ×1 IMPLANT
CONT SPEC 4OZ CLIKSEAL STRL BL (MISCELLANEOUS) ×2 IMPLANT
COVER BACK TABLE 60X90IN (DRAPES) ×4 IMPLANT
DIFFUSER DRILL AIR PNEUMATIC (MISCELLANEOUS) ×2 IMPLANT
DRAPE C-ARM 42X72 X-RAY (DRAPES) ×4 IMPLANT
DRAPE HALF SHEET 40X57 (DRAPES) ×2 IMPLANT
DRAPE LAPAROTOMY 100X72X124 (DRAPES) ×2 IMPLANT
DRAPE POUCH INSTRU U-SHP 10X18 (DRAPES) ×2 IMPLANT
DRAPE SURG 17X23 STRL (DRAPES) ×8 IMPLANT
ELECT BLADE 4.0 EZ CLEAN MEGAD (MISCELLANEOUS) ×2
ELECT REM PT RETURN 9FT ADLT (ELECTROSURGICAL) ×2
ELECTRODE BLDE 4.0 EZ CLN MEGD (MISCELLANEOUS) ×1 IMPLANT
ELECTRODE REM PT RTRN 9FT ADLT (ELECTROSURGICAL) ×1 IMPLANT
EVACUATOR 1/8 PVC DRAIN (DRAIN) IMPLANT
GAUZE SPONGE 4X4 12PLY STRL (GAUZE/BANDAGES/DRESSINGS) ×2 IMPLANT
GAUZE SPONGE 4X4 16PLY XRAY LF (GAUZE/BANDAGES/DRESSINGS) ×2 IMPLANT
GLOVE BIO SURGEON STRL SZ8 (GLOVE) ×4 IMPLANT
GLOVE BIO SURGEON STRL SZ8.5 (GLOVE) ×4 IMPLANT
GLOVE EXAM NITRILE LRG STRL (GLOVE) IMPLANT
GLOVE EXAM NITRILE XL STR (GLOVE) IMPLANT
GLOVE EXAM NITRILE XS STR PU (GLOVE) IMPLANT
GLOVE INDICATOR 7.5 STRL GRN (GLOVE) ×4 IMPLANT
GOWN STRL REUS W/ TWL LRG LVL3 (GOWN DISPOSABLE) IMPLANT
GOWN STRL REUS W/ TWL XL LVL3 (GOWN DISPOSABLE) ×2 IMPLANT
GOWN STRL REUS W/TWL 2XL LVL3 (GOWN DISPOSABLE) IMPLANT
GOWN STRL REUS W/TWL LRG LVL3 (GOWN DISPOSABLE)
GOWN STRL REUS W/TWL XL LVL3 (GOWN DISPOSABLE) ×2
KIT BASIN OR (CUSTOM PROCEDURE TRAY) ×2 IMPLANT
KIT INFUSE XX SMALL 0.7CC (Orthopedic Implant) ×2 IMPLANT
KIT ROOM TURNOVER OR (KITS) ×2 IMPLANT
NEEDLE HYPO 21X1.5 SAFETY (NEEDLE) IMPLANT
NEEDLE HYPO 22GX1.5 SAFETY (NEEDLE) ×2 IMPLANT
NS IRRIG 1000ML POUR BTL (IV SOLUTION) ×2 IMPLANT
OIL CARTRIDGE MAESTRO DRILL (MISCELLANEOUS) ×2
PACK LAMINECTOMY NEURO (CUSTOM PROCEDURE TRAY) ×2 IMPLANT
PAD ARMBOARD 7.5X6 YLW CONV (MISCELLANEOUS) ×6 IMPLANT
PATTIES SURGICAL .5 X1 (DISPOSABLE) IMPLANT
ROD REVERE 6.35 40MM (Rod) ×4 IMPLANT
SCREW 7.5X50MM (Screw) ×8 IMPLANT
SPACER ALTERA 10X31-15 (Spacer) ×2 IMPLANT
SPONGE LAP 4X18 X RAY DECT (DISPOSABLE) IMPLANT
SPONGE NEURO XRAY DETECT 1X3 (DISPOSABLE) IMPLANT
SPONGE SURGIFOAM ABS GEL 100 (HEMOSTASIS) ×2 IMPLANT
STRIP BIOACTIVE 20CC 25X100X8 (Miscellaneous) ×2 IMPLANT
STRIP CLOSURE SKIN 1/2X4 (GAUZE/BANDAGES/DRESSINGS) ×2 IMPLANT
SUT VIC AB 1 CT1 18XBRD ANBCTR (SUTURE) ×2 IMPLANT
SUT VIC AB 1 CT1 8-18 (SUTURE) ×2
SUT VIC AB 2-0 CP2 18 (SUTURE) ×4 IMPLANT
TAPE CLOTH SURG 4X10 WHT LF (GAUZE/BANDAGES/DRESSINGS) ×2 IMPLANT
TOWEL GREEN STERILE (TOWEL DISPOSABLE) ×2 IMPLANT
TOWEL GREEN STERILE FF (TOWEL DISPOSABLE) ×2 IMPLANT
TRAY FOLEY W/METER SILVER 16FR (SET/KITS/TRAYS/PACK) ×2 IMPLANT
WATER STERILE IRR 1000ML POUR (IV SOLUTION) ×2 IMPLANT

## 2016-12-30 NOTE — Anesthesia Preprocedure Evaluation (Addendum)
Anesthesia Evaluation  Patient identified by MRN, date of birth, ID band Patient awake    Reviewed: Allergy & Precautions, NPO status , Patient's Chart, lab work & pertinent test results  Airway Mallampati: II  TM Distance: >3 FB Neck ROM: Full    Dental  (+) Edentulous Upper   Pulmonary former smoker,    breath sounds clear to auscultation       Cardiovascular  Rhythm:Regular Rate:Normal     Neuro/Psych    GI/Hepatic   Endo/Other  diabetes  Renal/GU      Musculoskeletal   Abdominal (+) + obese,   Peds  Hematology   Anesthesia Other Findings   Reproductive/Obstetrics                           Anesthesia Physical Anesthesia Plan  ASA: III  Anesthesia Plan: General   Post-op Pain Management:    Induction: Intravenous  PONV Risk Score and Plan: 1 and Ondansetron and Dexamethasone  Airway Management Planned: Oral ETT  Additional Equipment:   Intra-op Plan:   Post-operative Plan: Extubation in OR  Informed Consent: I have reviewed the patients History and Physical, chart, labs and discussed the procedure including the risks, benefits and alternatives for the proposed anesthesia with the patient or authorized representative who has indicated his/her understanding and acceptance.   Dental advisory given  Plan Discussed with: CRNA and Anesthesiologist  Anesthesia Plan Comments:         Anesthesia Quick Evaluation

## 2016-12-30 NOTE — Op Note (Signed)
Brief history: The patient is a 71 year old white female who has complained of back, buttock and leg pain consistent with neurogenic claudication. 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 I discussed the various treatment options with the patient including surgery. She has weighed the risks, benefits, and alternatives to surgery and decided proceed with an L4-5 decompression, instrumentation, and fusion.  Preoperative diagnosis: L4-5 spondylolisthesis, facet arthropathy,  spinal stenosis compressing both the L4 and the L5 nerve roots; lumbago; lumbar radiculopathy; neurogenic claudication  Postoperative diagnosis: The same  Procedure: Bilateral L4-5 Laminotomy/foraminotomies to decompress the bilateral L4 and L5 nerve roots(the work required to do this was in addition to the work required to do the posterior lumbar interbody fusion because of the patient's spinal stenosis, facet arthropathy. Etc. requiring a wide decompression of the nerve roots.); L4-5 transforaminal lumbar interbody fusion with local morselized autograft bone and Kinnex graft extender and bone morphogenic protein-soaked collagen sponges; insertion of interbody prosthesis at L4-5 (globus peek expandable interbody prosthesis); posterior nonsegmental instrumentation from L4 to L5 with globus titanium pedicle screws and rods; posterior lateral arthrodesis at L4-5 with local morselized autograft bone and Kinnex bone graft extender and bone morphogenic protein soaked coated sponges.  Surgeon: Dr. Earle Gell  Asst.: Dr. Saintclair Halsted  Anesthesia: Gen. endotracheal  Estimated blood loss: 200 mL  Drains: None  Complications: None  Description of procedure: The patient was brought to the operating room by the anesthesia team. General endotracheal anesthesia was induced. The patient was turned to the prone  position on the Wilson frame. The patient's lumbosacral region was then prepared with Betadine scrub and Betadine solution. Sterile drapes were applied.  I then injected the area to be incised with Marcaine with epinephrine solution. I then used the scalpel to make a linear midline incision over the L4-5 interspace. I then used electrocautery to perform a bilateral subperiosteal dissection exposing the spinous process and lamina of L4 and L5. We then obtained intraoperative radiograph to confirm our location. We then inserted the Verstrac retractor to provide exposure.  I began the decompression by using the high speed drill to perform laminotomies at L4-5 bilaterally. We then used the Kerrison punches to widen the laminotomy and removed the ligamentum flavum at L4-5 bilaterally. We used the Kerrison punches to remove the medial facets at L4-5 bilaterally. We performed wide foraminotomies about the bilateral L4 and L5 nerve roots completing the decompression.  We now turned our attention to the posterior lumbar interbody fusion. I used a scalpel to incise the intervertebral disc at L4-5 bilaterally. I then performed a partial intervertebral discectomy at L4-5 bilaterally using the pituitary forceps. We prepared the vertebral endplates at J2-4 bilaterally for the fusion by removing the soft tissues with the curettes. We then used the trial spacers to pick the appropriate sized interbody prosthesis. We prefilled his prosthesis with a combination of local morselized autograft bone that we obtained during the decompression as well as Kinnex bone graft extender. We inserted the prefilled prosthesis into the interspace at L4-5, we then expanded the prosthesis. There was a good snug fit of the prosthesis in the interspace. We then filled and the remainder of the intervertebral disc space with local morselized autograft bone and Kinnex and small bone morphogenic protein-soaked collagen sponges. This completed the  posterior lumbar interbody arthrodesis.  We now turned attention to the instrumentation. Under fluoroscopic guidance we  cannulated the bilateral L4 and L5 pedicles with the bone probe. We then removed the bone probe. We then tapped the pedicle with a 6.5 millimeter tap. We then removed the tap. We probed inside the tapped pedicle with a ball probe to rule out cortical breaches. We then inserted a 7.5 x 50 millimeter pedicle screw into the L4 and L5 pedicles bilaterally under fluoroscopic guidance. We then palpated along the medial aspect of the pedicles to rule out cortical breaches. There were none. The nerve roots were not injured. We then connected the unilateral pedicle screws with a lordotic rod. We compressed the construct and secured the rod in place with the caps. We then tightened the caps appropriately. This completed the instrumentation from L4-5.  We now turned our attention to the posterior lateral arthrodesis at L4-5 bilaterally. We used the high-speed drill to decorticate the remainder of the facets, pars, transverse process at L4-5 bilaterally. We then applied a combination of local morselized autograft bone, bone morphogenic protein soaked collagen sponges and Kinnex bone graft extender over these decorticated posterior lateral structures. This completed the posterior lateral arthrodesis.  We then obtained hemostasis using bipolar electrocautery. We irrigated the wound out with bacitracin solution. We inspected the thecal sac and nerve roots and noted they were well decompressed. We then removed the retractor. We placed vancomycin powder in the wound. We reapproximated patient's thoracolumbar fascia with interrupted #1 Vicryl suture. We reapproximated patient's subcutaneous tissue with interrupted 2-0 Vicryl suture. The reapproximated patient's skin with Steri-Strips and benzoin. The wound was then coated with bacitracin ointment. A sterile dressing was applied. The drapes were removed. The  patient was subsequently returned to the supine position where they were extubated by the anesthesia team. He was then transported to the post anesthesia care unit in stable condition. All sponge instrument and needle counts were reportedly correct at the end of this case.

## 2016-12-30 NOTE — Progress Notes (Signed)
Patient ID: Andrea [name], female   DOB: 08/06/1945, 71 y.o.   MRN: [id number] Subjective:  the patient is alert and pleasant. She is in no apparent distress.  Objective: Vital signs in last 24 hours: Temp:  [97.7 F (36.5 C)-98.1 F (36.7 C)] 97.7 F (36.5 C) (10/25 1537) Pulse Rate:  [76-130] 76 (10/25 1600) Resp:  [15-31] 15 (10/25 1600) BP: (82-142)/(39-59) 111/39 (10/25 1552) SpO2:  [90 %-96 %] 95 % (10/25 1600) Weight:  [77.3 kg (170 lb 8 oz)] 77.3 kg (170 lb 8 oz) (10/25 1037)  Intake/Output from previous day: No intake/output data recorded. Intake/Output this shift: Total I/O In: 1850 [I.V.:1600; IV Piggyback:250] Out: 395 [Urine:195; Blood:200]  Physical exam the patient is alert and pleasant. She is moving her lower extremities well.  Lab Results:  Recent Labs  12/28/16 1155  WBC 6.1  HGB 13.6  HCT 43.5  PLT 175   BMET  Recent Labs  12/28/16 1155  NA 138  K 4.4  CL 103  CO2 28  GLUCOSE 203*  BUN 16  CREATININE 0.97  CALCIUM 9.5    Studies/Results: Dg Lumbar Spine 2-3 Views  Result Date: 12/30/2016 CLINICAL DATA:  Posterior fusion at L4-5 EXAM: LUMBAR SPINE - 2-3 VIEW COMPARISON:  Lumbar spine films 11/16/2016 FINDINGS: Two C-arm spot films show placement of pedicular screws at L4 on L5 with interbody fusion plug in good position for fusion of L4-5. No complicating features are seen. IMPRESSION: Hardware placed for posterior fusion of L4-5. Electronically Signed   By: Ivar Drape M.D.   On: 12/30/2016 15:27   Dg Lumbar Spine 1 View  Result Date: 12/30/2016 CLINICAL DATA:  Lumbar spine surgery. EXAM: LUMBAR SPINE - 1 VIEW COMPARISON:  MRI 11/04/2016. FINDINGS: MRI scratched it lumbar spine numbered as per prior exam. Metallic marker noted posteriorly at L4-L5 disc space level. IMPRESSION: Metallic marker noted posteriorly at the L4-L5 disc space level. Electronically Signed   By: Marcello Moores  Register   On: 12/30/2016 13:17   Dg C-arm 1-60  Min  Result Date: 12/30/2016 CLINICAL DATA:  Posterior fusion at L4-5 EXAM: DG C-ARM 61-120 MIN COMPARISON:  Lumbar spine films of 11/16/2016 FINDINGS: C-arm fluoroscopy was provided during posterior fusion at L4-5. Fluoroscopy time of 30 seconds was recorded. IMPRESSION: C-arm fluoroscopy provided. Electronically Signed   By: Ivar Drape M.D.   On: 12/30/2016 15:26    Assessment/Plan: The patient is doing well. I spoke with her family.  LOS: 0 days     Cambree Hendrix D 12/30/2016, 4:33 PM

## 2016-12-30 NOTE — Anesthesia Procedure Notes (Signed)
Procedure Name: Intubation Date/Time: 12/30/2016 12:09 PM Performed by: Rejeana Brock L Pre-anesthesia Checklist: Patient identified, Emergency Drugs available, Suction available and Patient being monitored Patient Re-evaluated:Patient Re-evaluated prior to induction Oxygen Delivery Method: Circle System Utilized Preoxygenation: Pre-oxygenation with 100% oxygen Induction Type: IV induction Ventilation: Mask ventilation without difficulty Laryngoscope Size: Mac and 3 Grade View: Grade I Tube type: Oral Tube size: 7.0 mm Number of attempts: 1 Airway Equipment and Method: Stylet and Oral airway Placement Confirmation: ETT inserted through vocal cords under direct vision,  positive ETCO2 and breath sounds checked- equal and bilateral Secured at: 20 cm Tube secured with: Tape Dental Injury: Teeth and Oropharynx as per pre-operative assessment

## 2016-12-30 NOTE — Anesthesia Postprocedure Evaluation (Signed)
Anesthesia Post Note  Patient: Andrea [name]  Procedure(s) Performed: POSTERIOR LUMBAR INTERBODY FUSION, POSTERIOR INSTRUMENTATION LUMBAR 4- LUMBAR 5 (N/A )     Patient location during evaluation: PACU Anesthesia Type: General Level of consciousness: awake, oriented and awake and alert Pain management: pain level controlled Vital Signs Assessment: post-procedure vital signs reviewed and stable Respiratory status: spontaneous breathing, nonlabored ventilation and respiratory function stable Cardiovascular status: blood pressure returned to baseline Anesthetic complications: no    Last Vitals:  Vitals:   12/30/16 1700 12/30/16 1709  BP:  97/63  Pulse: 80 79  Resp: (!) 29 13  Temp:    SpO2: 96% 97%    Last Pain:  Vitals:   12/30/16 1702  TempSrc:   PainSc: 7     LLE Motor Response: Purposeful movement (12/30/16 1707) LLE Sensation: Full sensation (12/30/16 1707) RLE Motor Response: Purposeful movement (12/30/16 1707) RLE Sensation: Full sensation (12/30/16 1707)      Kadeem Hyle COKER

## 2016-12-30 NOTE — Transfer of Care (Signed)
Immediate Anesthesia Transfer of Care Note  Patient: Andrea [name]  Procedure(s) Performed: POSTERIOR LUMBAR INTERBODY FUSION, POSTERIOR INSTRUMENTATION LUMBAR 4- LUMBAR 5 (N/A )  Patient Location: PACU  Anesthesia Type:General  Level of Consciousness: awake and alert   Airway & Oxygen Therapy: Patient Spontanous Breathing and Patient connected to nasal cannula oxygen  Post-op Assessment: Report given to RN, Post -op Vital signs reviewed and stable and Patient moving all extremities X 4  Post vital signs: Reviewed and stable  Last Vitals:  Vitals:   12/30/16 1037  BP: (!) 142/59  Pulse: 83  Resp: 20  Temp: 36.7 C  SpO2: 96%    Last Pain:  Vitals:   12/30/16 1048  TempSrc:   PainSc: 7       Patients Stated Pain Goal: 4 (78/41/28 2081)  Complications: No apparent anesthesia complications

## 2016-12-30 NOTE — H&P (Signed)
Subjective: The patient is a 71 year old white female who has complained of back and leg pain consistent with neurogenic claudication. She has failed medical management and was worked up with a lumbar MRI. This demonstrated an L4-5 spondylolisthesis and spinal stenosis. I discussed the various treatment options. She has decided to proceed with surgery.   Past Medical History:  Diagnosis Date  . Bronchitis   . Coronary artery disease   . Depression   . Diabetes mellitus without complication ([place])   . Frequent urinary tract infections   . GERD (gastroesophageal reflux disease)   . MI (myocardial infarction) ([place])   . Neuromuscular disorder ([place])    nerve pain in back and legs  . Osteoporosis   . Pneumonia 2014  . Stroke ([place])   . TIA (transient ischemic attack)   . Wheezing     Past Surgical History:  Procedure Laterality Date  . Angio plasty  1995  . COLONOSCOPY     x3  . EYE SURGERY     cataracts  . HAND SURGERY Left 2012  . HIP FRACTURE SURGERY  2015    Allergies  Allergen Reactions  . Metformin And Related Diarrhea  . Sulfa Antibiotics Rash    Social History  Substance Use Topics  . Smoking status: Former Research scientist (life sciences)  . Smokeless tobacco: Never Used     Comment: Quit 2014  . Alcohol use No    Family History  Problem Relation Age of Onset  . Depression Daughter   . Arthritis Daughter        spine  . Plantar fasciitis Daughter   . Anxiety disorder Daughter   . Hypertension Mother   . Stroke Mother   . Cataracts Mother   . Depression Mother   . Asthma Brother    Prior to Admission medications   Medication Sig Start Date End Date Taking? Authorizing Provider  albuterol (PROVENTIL HFA;VENTOLIN HFA) 108 (90 Base) MCG/ACT inhaler Inhale 2 puffs into the lungs every 6 (six) hours as needed for wheezing or shortness of breath. 09/23/16  Yes Mar Daring, PA-C  amoxicillin-clavulanate (AUGMENTIN) 875-125 MG tablet Take 1 tablet by mouth 2 (two) times daily.  12/16/16  Yes Mar Daring, PA-C  aspirin 81 MG tablet Take 81 mg by mouth daily.   Yes [provider]  atorvastatin (LIPITOR) 80 MG tablet Take 1 tablet (80 mg total) by mouth daily. 12/29/16  Yes Mar Daring, PA-C  Cyanocobalamin (B-12) 1000 MCG CAPS Take 1,000 mcg by mouth daily.    Yes [provider]  Dulaglutide (TRULICITY) 5.10 CH/8.5ID SOPN Inject 0.75 mg into the skin once a week. 12/17/16  Yes Mar Daring, PA-C  gabapentin (NEURONTIN) 600 MG tablet Take 1 tablet (600 mg total) by mouth 3 (three) times daily. 06/14/16  Yes Mar Daring, PA-C  HYDROcodone-acetaminophen (NORCO) 10-325 MG tablet Take 1 tablet by mouth every 8 (eight) hours as needed. 12/24/16  Yes [name] M, PA-C  insulin glargine (LANTUS) 100 UNIT/ML injection Inject 0.65 mLs (65 Units total) into the skin 2 (two) times daily with a meal. 07/23/16  Yes Burnette, Clearnce Sorrel, PA-C  losartan (COZAAR) 50 MG tablet Take 0.5 tablets (25 mg total) by mouth daily. 08/09/16  Yes Mar Daring, PA-C  metoprolol succinate (TOPROL-XL) 25 MG 24 hr tablet Take 0.5 tablets (12.5 mg total) by mouth daily. 07/15/16  Yes Mar Daring, PA-C  Multiple Vitamins-Minerals (ICAPS AREDS 2 PO) Take 1 capsule by mouth 2 (two)  times daily.    Yes [provider]  nitrofurantoin (MACRODANTIN) 100 MG capsule Take 1 capsule (100 mg total) by mouth at bedtime. 12/16/16  Yes Mar Daring, PA-C  Omega-3 Fatty Acids (FISH OIL) 1200 MG CAPS Take 1,200 mg by mouth daily.    Yes [provider]  omeprazole (PRILOSEC) 40 MG capsule Take 1 capsule (40 mg total) by mouth daily. 10/25/16  Yes Mar Daring, PA-C  oxybutynin (DITROPAN-XL) 10 MG 24 hr tablet Take 1 tablet (10 mg total) by mouth at bedtime. Patient taking differently: Take 10 mg by mouth daily.  06/14/16  Yes Mar Daring, PA-C  sertraline (ZOLOFT) 25 MG tablet Take 1 tablet (25 mg total) by  mouth daily. 07/13/16  Yes Mar Daring, PA-C  terconazole (TERAZOL 3) 0.8 % vaginal cream Place 1 applicator vaginally at bedtime. Patient taking differently: Place 1 applicator vaginally at bedtime as needed (yeast infections).  10/25/16  Yes Mar Daring, PA-C  Vitamin D, Ergocalciferol, (DRISDOL) 50000 units CAPS capsule Take 1 capsule (50,000 Units total) by mouth every 7 (seven) days. 07/13/16  Yes Mar Daring, PA-C  Alcohol Swabs (ALCOHOL PREP) PADS Use twice daily prior to SQ injection of insulin to clean skin 10/02/15   Mar Daring, PA-C  Insulin Syringe-Needle U-100 29G 1 ML MISC Use to inject 65 units Lantus SQ BIDWC 10/02/15   Mar Daring, PA-C  [facility] DELICA LANCETS 16X MISC To check blood sugar daily 10/13/16   Mar Daring, PA-C  [facility] VERIO test strip To check blood sugar once daily 06/23/16   Mar Daring, PA-C     Review of Systems  Positive ROS: As above  All other systems have been reviewed and were otherwise negative with the exception of those mentioned in the HPI and as above.  Objective: Vital signs in last 24 hours: Temp:  [98.1 F (36.7 C)] 98.1 F (36.7 C) (10/25 1037) Pulse Rate:  [83] 83 (10/25 1037) Resp:  [20] 20 (10/25 1037) BP: (142)/(59) 142/59 (10/25 1037) SpO2:  [96 %] 96 % (10/25 1037) Weight:  [77.3 kg (170 lb 8 oz)] 77.3 kg (170 lb 8 oz) (10/25 1037)  General Appearance: Alert Head: Normocephalic, without obvious abnormality, atraumatic Eyes: PERRL, conjunctiva/corneas clear, EOM's intact,    Ears: Normal  Throat: Normal  Neck: Supple, Back: unremarkable Lungs: Clear to auscultation bilaterally, respirations unlabored Heart: Regular rate and rhythm, no murmur, rub or gallop Abdomen: Soft, non-tender Extremities: Extremities normal, atraumatic, no cyanosis or edema Skin: unremarkable  NEUROLOGIC:   Mental status: alert and oriented,Motor Exam - grossly normal Sensory Exam - grossly  normal Reflexes:  Coordination - grossly normal Gait - grossly normal Balance - grossly normal Cranial Nerves: I: smell Not tested  II: visual acuity  OS: Normal  OD: Normal   II: visual fields Full to confrontation  II: pupils Equal, round, reactive to light  III,VII: ptosis None  III,IV,VI: extraocular muscles  Full ROM  V: mastication Normal  V: facial light touch sensation  Normal  V,VII: corneal reflex  Present  VII: facial muscle function - upper  Normal  VII: facial muscle function - lower Normal  VIII: hearing Not tested  IX: soft palate elevation  Normal  IX,X: gag reflex Present  XI: trapezius strength  5/5  XI: sternocleidomastoid strength 5/5  XI: neck flexion strength  5/5  XII: tongue strength  Normal    Data Review Lab Results  Component Value Date  WBC 6.1 12/28/2016   HGB 13.6 12/28/2016   HCT 43.5 12/28/2016   MCV 94.0 12/28/2016   PLT 175 12/28/2016   Lab Results  Component Value Date   NA 138 12/28/2016   K 4.4 12/28/2016   CL 103 12/28/2016   CO2 28 12/28/2016   BUN 16 12/28/2016   CREATININE 0.97 12/28/2016   GLUCOSE 203 (H) 12/28/2016   No results found for: INR, PROTIME  Assessment/Plan: L4-5 spinal listhesis, spinal stenosis, lumbar radiculopathy, neurogenic claudication, lumbago: I discussed the situation with the patient and reviewed her imaging studies with her. We have discussed the various treatment options including surgery. I have described the surgical treatment option of an L4-5 decompression, instrumentation and fusion. I have shown her surgical models. I have given her a surgical pamphlet. I have discussed the risks, benefits, alternatives, expected postoperative course, and likelihood of achieving our goals with surgery. I have answered all her questions. She has decided proceed with surgery.   Rafiel Mecca D 12/30/2016 11:09 AM

## 2016-12-31 ENCOUNTER — Encounter (HOSPITAL_COMMUNITY): Payer: Self-pay | Admitting: *Deleted

## 2016-12-31 LAB — BASIC METABOLIC PANEL
ANION GAP: 8 (ref 5–15)
BUN: 22 mg/dL — ABNORMAL HIGH (ref 6–20)
CHLORIDE: 100 mmol/L — AB (ref 101–111)
CO2: 27 mmol/L (ref 22–32)
Calcium: 8.6 mg/dL — ABNORMAL LOW (ref 8.9–10.3)
Creatinine, Ser: 1.15 mg/dL — ABNORMAL HIGH (ref 0.44–1.00)
GFR calc non Af Amer: 47 mL/min — ABNORMAL LOW (ref >60)
GFR, EST AFRICAN AMERICAN: 54 mL/min — AB (ref >60)
Glucose, Bld: 299 mg/dL — ABNORMAL HIGH (ref 65–99)
POTASSIUM: 4.8 mmol/L (ref 3.5–5.1)
SODIUM: 135 mmol/L (ref 135–145)

## 2016-12-31 LAB — GLUCOSE, CAPILLARY
GLUCOSE-CAPILLARY: 306 mg/dL — AB (ref 65–99)
GLUCOSE-CAPILLARY: 232 mg/dL — AB (ref 65–99)
GLUCOSE-CAPILLARY: 308 mg/dL — AB (ref 65–99)
Glucose-Capillary: 238 mg/dL — ABNORMAL HIGH (ref 65–99)
Glucose-Capillary: 320 mg/dL — ABNORMAL HIGH (ref 65–99)

## 2016-12-31 LAB — CBC
HCT: 33.8 % — ABNORMAL LOW (ref 36.0–46.0)
HEMOGLOBIN: 10.3 g/dL — AB (ref 12.0–15.0)
MCH: 28.9 pg (ref 26.0–34.0)
MCHC: 30.5 g/dL (ref 30.0–36.0)
MCV: 94.7 fL (ref 78.0–100.0)
Platelets: 149 10*3/uL — ABNORMAL LOW (ref 150–400)
RBC: 3.57 MIL/uL — AB (ref 3.87–5.11)
RDW: 15 % (ref 11.5–15.5)
WBC: 11.2 10*3/uL — ABNORMAL HIGH (ref 4.0–10.5)

## 2016-12-31 MED ORDER — LIVING WELL WITH DIABETES BOOK
Freq: Once | Status: AC
  Administered 2016-12-31: 10:00:00
  Filled 2016-12-31: qty 1

## 2016-12-31 NOTE — Evaluation (Signed)
Physical Therapy Evaluation Patient Details Name: Andrea [name] MRN: [id number] DOB: 04-03-1945 Today's Date: 12/31/2016   History of Present Illness  The patient is a 71 year old white female who has complained of back, buttock and leg pain consistent with neurogenic claudication. 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   Past Medical History:  Diagnosis Date  . Bronchitis   . Coronary artery disease   . Depression   . Diabetes mellitus without complication ([place])   . Frequent urinary tract infections   . GERD (gastroesophageal reflux disease)   . MI (myocardial infarction) ([place])   . Neuromuscular disorder ([place])    nerve pain in back and legs  . Osteoporosis   . Pneumonia 2014  . Stroke ([place])   . TIA (transient ischemic attack)   . Wheezing      Clinical Impression  Patient is s/p above surgery resulting in functional limitations due to the deficits listed below (see PT Problem List). Pt mobility is limited by pain and generalized weakness of her B LE. Pt currently minA for transfers and ambulation of 50 feet with RW support. Patient will benefit from skilled PT to increase their independence and safety with mobility to allow discharge to the venue listed below.       Follow Up Recommendations SNF    Equipment Recommendations  Other (comment) (to be determined at next venue)    Recommendations for Other Services OT consult     Precautions / Restrictions Precautions Precautions: Back Precaution Booklet Issued: Yes (comment) Required Braces or Orthoses: Spinal Brace Spinal Brace: Thoracolumbosacral orthotic;Applied in sitting position Restrictions Weight Bearing Restrictions: No      Mobility  Bed Mobility               General  bed mobility comments: seated EoB at entry   Transfers Overall transfer level: Needs assistance Equipment used: Rolling walker (2 wheeled) Transfers: Sit to/from Stand Sit to Stand: Min assist         General transfer comment: minA for power up and steadying in walker  Ambulation/Gait Ambulation/Gait assistance: Min assist Ambulation Distance (Feet): 50 Feet Assistive device: Rolling walker (2 wheeled) Gait Pattern/deviations: Decreased step length - right;Decreased step length - left;Shuffle;Decreased weight shift to left Gait velocity: slowed Gait velocity interpretation: Below normal speed for age/gender General Gait Details: minA for steadying with gait, due to L LE shorter than R pt requires hip hike clear R LE that increases back pain      Balance Overall balance assessment: Needs assistance Sitting-balance support: Feet supported;No upper extremity supported Sitting balance-Leahy Scale: Good     Standing balance support: Bilateral upper extremity supported Standing balance-Leahy Scale: Poor Standing balance comment: requires RW support for standing balance                             Pertinent Vitals/Pain Pain Assessment: 0-10 Pain Score: 8  Pain Location: back  Pain Descriptors / Indicators: Aching;Burning Pain Intervention(s): Premedicated before session;Monitored during session;Limited activity within patient's tolerance    Home Living Family/patient expects to be discharged to:: Private residence Living Arrangements: Spouse/significant other Available Help at Discharge: Available PRN/intermittently;Friend(s);Family Type of Home: Mobile home Home Access: Ramped entrance     Home Layout: One level Home Equipment: Gilford Rile -  2 wheels;Wheelchair - manual;Tub bench;Grab bars - toilet      Prior Function Level of Independence: Needs assistance   Gait / Transfers Assistance Needed: been using her wheelchair the majority of the time  recently  ADL's / Homemaking Assistance Needed:  needs assist with bathing and dressing, and laundry           Extremity/Trunk Assessment   Upper Extremity Assessment Upper Extremity Assessment: Overall WFL for tasks assessed    Lower Extremity Assessment Lower Extremity Assessment: LLE deficits/detail LLE Deficits / Details: L leg shorter than R s/p previous hip surgery, should wear lift shoes but can't afford, strength grossly 3+/5 with functional activity    Cervical / Trunk Assessment Cervical / Trunk Assessment: Kyphotic  Communication   Communication: No difficulties  Cognition Arousal/Alertness: Awake/alert Behavior During Therapy: WFL for tasks assessed/performed Overall Cognitive Status: Within Functional Limits for tasks assessed                                        General Comments General comments (skin integrity, edema, etc.): VSS throughout session        Assessment/Plan    PT Assessment Patient needs continued PT services  PT Problem List Decreased strength;Decreased activity tolerance;Decreased balance;Decreased mobility;Decreased knowledge of precautions;Pain       PT Treatment Interventions DME instruction;Gait training;Therapeutic activities;Functional mobility training;Therapeutic exercise;Balance training;Patient/family education    PT Goals (Current goals can be found in the Care Plan section)  Acute Rehab PT Goals Patient Stated Goal: have less pain PT Goal Formulation: With patient Time For Goal Achievement: 01/05/17 Potential to Achieve Goals: Fair    Frequency Min 4X/week   Barriers to discharge Decreased caregiver support no one can be with pt throughout day       AM-PAC PT "6 Clicks" Daily Activity  Outcome Measure Difficulty turning over in bed (including adjusting bedclothes, sheets and blankets)?: Unable Difficulty moving from lying on back to sitting on the side of the bed? : Unable Difficulty sitting down  on and standing up from a chair with arms (e.g., wheelchair, bedside commode, etc,.)?: Unable Help needed moving to and from a bed to chair (including a wheelchair)?: A Little Help needed walking in hospital room?: A Little Help needed climbing 3-5 steps with a railing? : Total 6 Click Score: 10    End of Session Equipment Utilized During Treatment: Gait belt;Back brace Activity Tolerance: Patient limited by pain;Patient limited by fatigue Patient left: in chair;with call bell/phone within reach;with chair alarm set Nurse Communication: Mobility status PT Visit Diagnosis: Unsteadiness on feet (R26.81);Other abnormalities of gait and mobility (R26.89);Muscle weakness (generalized) (M62.81);History of falling (Z91.81);Difficulty in walking, not elsewhere classified (R26.2);Pain Pain - part of body:  (back)    Time: 1740-8144 PT Time Calculation (min) (ACUTE ONLY): 39 min   Charges:   PT Evaluation $PT Eval Moderate Complexity: 1 Mod PT Treatments $Gait Training: 8-22 mins $Therapeutic Activity: 8-22 mins   PT G Codes:        Aarilyn Dye B. Migdalia Dk PT, DPT Acute Rehabilitation  [phone] Pager [phone]    [place] 12/31/2016, 3:02 PM

## 2016-12-31 NOTE — Progress Notes (Signed)
Inpatient Diabetes Program Recommendations  AACE/ADA: New Consensus Statement on Inpatient Glycemic Control (2015)  Target Ranges:  Prepandial:   less than 140 mg/dL      Peak postprandial:   less than 180 mg/dL (1-2 hours)      Critically ill patients:  140 - 180 mg/dL   Lab Results  Component Value Date   GLUCAP 238 (H) 12/31/2016   HGBA1C 10.4 (H) 12/30/2016   Review of Glycemic Control Results for Andrea [name], Andrea [name] (MRN [id number]) as of 12/31/2016 13:02  Ref. Range 12/30/2016 20:08 12/31/2016 00:01 12/31/2016 04:49 12/31/2016 08:03 12/31/2016 11:16  Glucose-Capillary Latest Ref Range: 65 - 99 mg/dL 306 (H) 318 (H) 306 (H) 232 (H) 238 (H)   Diabetes history: DM2 Outpatient Diabetes medications: Lantus 65 units bid + Trulicity .75 mg weekly Current orders for Inpatient glycemic control: Lantus 65 units bid + Dulaglutide 0.75 mg + Novolog moderate correction q 4 hrs.  Inpatient Diabetes Program Recommendations:    Spoke with pt about new diagnosis A1C 10.4 them and explained what an A1C is, basic pathophysiology of DM Type 2, basic home care, basic diabetes diet nutrition principles, importance of checking CBGs and maintaining good CBG control to prevent long-term and short-term complications. Reviewed signs and symptoms of hyperglycemia and hypoglycemia and how to treat hypoglycemia at home. Also reviewed blood sugar goals at home.  RNs to provide ongoing basic DM education at bedside with this patient. Have ordered educational booklet and DM videos.   Patient states she started on her prescription of Trulicity approximately 2 weeks ago plans to followup with PCP.  Thank you, Nani Gasser. Philo Kurtz, RN, MSN, CDE  Diabetes Coordinator Inpatient Glycemic Control Team Team Pager [phone] (8am-5pm) 12/31/2016 1:10 PM

## 2016-12-31 NOTE — Progress Notes (Signed)
Patient ID: Andrea [name], female   DOB: 1945/05/08, 71 y.o.   MRN: [id number] Subjective:  The patient is alert and pleasant. She looks well. She is in no apparent distress. Her back is appropriately sore.  Objective: Vital signs in last 24 hours: Temp:  [97.6 F (36.4 C)-98 F (36.7 C)] 98 F (36.7 C) (10/26 0938) Pulse Rate:  [72-130] 84 (10/26 0938) Resp:  [13-57] 16 (10/26 0938) BP: (82-139)/(39-113) 106/41 (10/26 0938) SpO2:  [87 %-100 %] 96 % (10/26 0938)  Intake/Output from previous day: 10/25 0701 - 10/26 0700 In: 2050 [I.V.:1600; IV Piggyback:450] Out: 1295 [Urine:1095; Blood:200] Intake/Output this shift: No intake/output data recorded.  Physical exam the patient is alert and oriented. Her strength is grossly normal and lower extremities. Her dressing is clean and dry.  Lab Results:  Recent Labs  12/31/16 0548  WBC 11.2*  HGB 10.3*  HCT 33.8*  PLT 149*   BMET  Recent Labs  12/31/16 0548  NA 135  K 4.8  CL 100*  CO2 27  GLUCOSE 299*  BUN 22*  CREATININE 1.15*  CALCIUM 8.6*    Studies/Results: Dg Lumbar Spine 2-3 Views  Result Date: 12/30/2016 CLINICAL DATA:  Posterior fusion at L4-5 EXAM: LUMBAR SPINE - 2-3 VIEW COMPARISON:  Lumbar spine films 11/16/2016 FINDINGS: Two C-arm spot films show placement of pedicular screws at L4 on L5 with interbody fusion plug in good position for fusion of L4-5. No complicating features are seen. IMPRESSION: Hardware placed for posterior fusion of L4-5. Electronically Signed   By: Ivar Drape M.[name].   On: 12/30/2016 15:27   Dg Lumbar Spine 1 View  Result Date: 12/30/2016 CLINICAL DATA:  Lumbar spine surgery. EXAM: LUMBAR SPINE - 1 VIEW COMPARISON:  MRI 11/04/2016. FINDINGS: MRI scratched it lumbar spine numbered as per prior exam. Metallic marker noted posteriorly at L4-L5 disc space level. IMPRESSION: Metallic marker noted posteriorly at the L4-L5 disc space level. Electronically Signed   By: Marcello Moores  Register   On:  12/30/2016 13:17   Dg C-arm 1-60 Min  Result Date: 12/30/2016 CLINICAL DATA:  Posterior fusion at L4-5 EXAM: DG C-ARM 61-120 MIN COMPARISON:  Lumbar spine films of 11/16/2016 FINDINGS: C-arm fluoroscopy was provided during posterior fusion at L4-5. Fluoroscopy time of 30 seconds was recorded. IMPRESSION: C-arm fluoroscopy provided. Electronically Signed   By: Ivar Drape M.[name].   On: 12/30/2016 15:26    Assessment/Plan: Postop day 1: The patient is doing well well neurologically. We will continue to mobilize her. She will likely go home over the weekend.  Renal insufficiency: Her creatinine is slightly elevated from 2 weeks ago. I'll plan to check her creatinine again tomorrow.  LOS: 1 day     Andrea [name] 12/31/2016, 12:43 PM

## 2016-12-31 NOTE — Progress Notes (Signed)
Orthopedic Tech Progress Note Patient Details:  Andrea [name] 1945-03-22 [id number]  Patient ID: [name], female   DOB: [birth year], 71 y.o.   MRN: [id number]   [place] Pink 12/31/2016, 8:32 [facility] [employer] for Lumbar brace.

## 2016-12-31 NOTE — Care Management Note (Signed)
Case Management Note  Patient Details  Name: Andrea [name] MRN: [id number] Date of Birth: 04/14/1945  Subjective/Objective:   Pt s/p lumbar fusion. She is from home with spouse.                 Action/Plan: PT recommending SNF. Awaiting OT rec. CM following for d/c disposition.  Expected Discharge Date:                  Expected Discharge Plan:  Skilled Nursing Facility  In-House Referral:  Clinical Social Work  Discharge planning Services     Post Acute Care Choice:    Choice offered to:     DME Arranged:    DME Agency:     HH Arranged:    [place] Agency:     Status of Service:  In process, will continue to follow  If discussed at Long Length of Stay Meetings, dates discussed:    Additional Comments:  Pollie Friar, RN 12/31/2016, 3:05 PM

## 2017-01-01 ENCOUNTER — Inpatient Hospital Stay (HOSPITAL_COMMUNITY): Payer: Medicare Other

## 2017-01-01 LAB — GLUCOSE, CAPILLARY
GLUCOSE-CAPILLARY: 132 mg/dL — AB (ref 65–99)
GLUCOSE-CAPILLARY: 179 mg/dL — AB (ref 65–99)
GLUCOSE-CAPILLARY: 247 mg/dL — AB (ref 65–99)
Glucose-Capillary: 159 mg/dL — ABNORMAL HIGH (ref 65–99)
Glucose-Capillary: 155 mg/dL — ABNORMAL HIGH (ref 65–99)
Glucose-Capillary: 198 mg/dL — ABNORMAL HIGH (ref 65–99)

## 2017-01-01 LAB — BASIC METABOLIC PANEL
ANION GAP: 8 (ref 5–15)
BUN: 24 mg/dL — ABNORMAL HIGH (ref 6–20)
CHLORIDE: 100 mmol/L — AB (ref 101–111)
CO2: 27 mmol/L (ref 22–32)
Calcium: 8.7 mg/dL — ABNORMAL LOW (ref 8.9–10.3)
Creatinine, Ser: 1.11 mg/dL — ABNORMAL HIGH (ref 0.44–1.00)
GFR calc Af Amer: 57 mL/min — ABNORMAL LOW (ref >60)
GFR, EST NON AFRICAN AMERICAN: 49 mL/min — AB (ref >60)
Glucose, Bld: 179 mg/dL — ABNORMAL HIGH (ref 65–99)
POTASSIUM: 4.5 mmol/L (ref 3.5–5.1)
SODIUM: 135 mmol/L (ref 135–145)

## 2017-01-01 MED ORDER — CYCLOBENZAPRINE HCL 10 MG PO TABS
5.0000 mg | ORAL_TABLET | Freq: Three times a day (TID) | ORAL | Status: DC | PRN
  Administered 2017-01-03 – 2017-01-05 (×3): 5 mg via ORAL
  Filled 2017-01-01 (×3): qty 1

## 2017-01-01 MED ORDER — GABAPENTIN 600 MG PO TABS
300.0000 mg | ORAL_TABLET | Freq: Three times a day (TID) | ORAL | Status: DC
  Administered 2017-01-01 – 2017-01-05 (×13): 300 mg via ORAL
  Filled 2017-01-01 (×13): qty 1

## 2017-01-01 MED ORDER — MORPHINE SULFATE (PF) 2 MG/ML IV SOLN: 2.0000 mg | INTRAVENOUS | Status: DC | PRN

## 2017-01-01 NOTE — Progress Notes (Signed)
Patient ID: Andrea [name], female   DOB: [birth year], 71 y.o.   MRN: [id number] Subjective:  The patient is mildly somnolent but easily arousable. She is in no apparent distress. She's had some hypoxia. She is on nasal cannula oxygen.  Objective: Vital signs in last 24 hours: Temp:  [98 F (36.7 C)-101.1 F (38.4 C)] 98.2 F (36.8 C) (10/27 0807) Pulse Rate:  [81-109] 107 (10/27 0920) Resp:  [16-20] 16 (10/27 0912) BP: (105-124)/(34-47) 114/34 (10/27 0912) SpO2:  [74 %-98 %] 95 % (10/27 0915)  Intake/Output from previous day: 10/26 0701 - 10/27 0700 In: 480 [P.O.:480] Out: -  Intake/Output this shift: No intake/output data recorded.  Physical exam the patient is somnolent but arousable. She is oriented to person and [facility] She is moving her lower extremities well.  Lab Results:  Recent Labs  12/31/16 0548  WBC 11.2*  HGB 10.3*  HCT 33.8*  PLT 149*   BMET  Recent Labs  12/31/16 0548 01/01/17 0617  NA 135 135  K 4.8 4.5  CL 100* 100*  CO2 27 27  GLUCOSE 299* 179*  BUN 22* 24*  CREATININE 1.15* 1.11*  CALCIUM 8.6* 8.7*    Studies/Results: Dg Lumbar Spine 2-3 Views  Result Date: 12/30/2016 CLINICAL DATA:  Posterior fusion at L4-5 EXAM: LUMBAR SPINE - 2-3 VIEW COMPARISON:  Lumbar spine films 11/16/2016 FINDINGS: Two C-arm spot films show placement of pedicular screws at L4 on L5 with interbody fusion plug in good position for fusion of L4-5. No complicating features are seen. IMPRESSION: Hardware placed for posterior fusion of L4-5. Electronically Signed   By: Ivar Drape M.D.   On: 12/30/2016 15:27   Dg Lumbar Spine 1 View  Result Date: 12/30/2016 CLINICAL DATA:  Lumbar spine surgery. EXAM: LUMBAR SPINE - 1 VIEW COMPARISON:  MRI 11/04/2016. FINDINGS: MRI scratched it lumbar spine numbered as per prior exam. Metallic marker noted posteriorly at L4-L5 disc space level. IMPRESSION: Metallic marker noted posteriorly at the L4-L5 disc space level.  Electronically Signed   By: Marcello Moores  Register   On: 12/30/2016 13:17   Dg C-arm 1-60 Min  Result Date: 12/30/2016 CLINICAL DATA:  Posterior fusion at L4-5 EXAM: DG C-ARM 61-120 MIN COMPARISON:  Lumbar spine films of 11/16/2016 FINDINGS: C-arm fluoroscopy was provided during posterior fusion at L4-5. Fluoroscopy time of 30 seconds was recorded. IMPRESSION: C-arm fluoroscopy provided. Electronically Signed   By: Ivar Drape M.D.   On: 12/30/2016 15:26    Assessment/Plan: Postop day #2: We will continue to mobilize the patient. She may need rehabilitation.  Hypoxia: I'll check a chest x-ray  Renal insufficiency: The patient is back to her baseline.  Somnolence: The patient's sodium is normal. I'll decrease her sedating medications.    LOS: 2 days     Jaking Thayer D 01/01/2017, 9:37 AM

## 2017-01-01 NOTE — Progress Notes (Signed)
Family called me to bedside. Patient is lethargic. Wakes to voice. Bilateral expiratory wheezing heard.Called respiratory.Repositioned patient in bed.

## 2017-01-01 NOTE — Progress Notes (Signed)
Physical Therapy Treatment Patient Details Name: Andrea [name] MRN: [id number] DOB: October 27, 1945 Today's Date: 01/01/2017    History of Present Illness The patient is a 71 year old white female who has complained of back, buttock and leg pain consistent with neurogenic claudication. 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     PT Comments    Pt making slow progress and somewhat lethargic today. Pt also limited secondary to pain and anxiety. Pt would continue to benefit from skilled physical therapy services at this time while admitted and after d/c to address the below listed limitations in order to improve overall safety and independence with functional mobility.    Follow Up Recommendations  SNF     Equipment Recommendations  None recommended by PT    Recommendations for Other Services       Precautions / Restrictions Precautions Precautions: Back Precaution Booklet Issued: Yes (comment) Required Braces or Orthoses: Spinal Brace Spinal Brace: Thoracolumbosacral orthotic;Applied in sitting position Restrictions Weight Bearing Restrictions: No    Mobility  Bed Mobility Overal bed mobility: Needs Assistance Bed Mobility: Rolling;Sidelying to Sit Rolling: Mod assist Sidelying to sit: Mod assist       General bed mobility comments: increased time and effort, assist at hips to roll, assist with bilateral LEs and trunk to achieve sitting EOB  Transfers Overall transfer level: Needs assistance Equipment used: Rolling walker (2 wheeled) Transfers: Sit to/from Stand Sit to Stand: Min assist         General transfer comment: assist to power into standing x2 from bed and x1 from toilet  Ambulation/Gait Ambulation/Gait assistance: Min  assist Ambulation Distance (Feet): 20 Feet (20' x1, 30' x1) Assistive device: Rolling walker (2 wheeled) Gait Pattern/deviations: Decreased step length - left;Shuffle;Decreased weight shift to left;Decreased stance time - right;Decreased stride length;Antalgic Gait velocity: decreased Gait velocity interpretation: Below normal speed for age/gender General Gait Details: pt intermittently dragging L LE, unsteady throughout and requiring constant min A for safety with RW   Stairs            Wheelchair Mobility    Modified [place] (Stroke Patients Only)       Balance Overall balance assessment: Needs assistance Sitting-balance support: Feet supported;Bilateral upper extremity supported Sitting balance-Leahy Scale: Poor Sitting balance - Comments: bracing in pain   Standing balance support: Bilateral upper extremity supported Standing balance-Leahy Scale: Poor                              Cognition Arousal/Alertness: Awake/alert Behavior During Therapy: Anxious (tearful) Overall Cognitive Status: Impaired/Different from baseline Area of Impairment: Memory;Safety/judgement;Problem solving                     Memory: Decreased recall of precautions;Decreased short-term memory   Safety/Judgement: Decreased awareness of deficits;Decreased awareness of safety   Problem Solving: Difficulty sequencing;Requires verbal cues;Requires tactile cues        Exercises      General Comments        Pertinent Vitals/Pain Pain Assessment: 0-10 Pain Score: 8  Pain Location: back  Pain Descriptors / Indicators: Aching;Burning Pain Intervention(s): Monitored during session;Repositioned    Home Living  Prior Function            PT Goals (current goals can now be found in the care plan section) Acute Rehab PT Goals PT Goal Formulation: With patient Time For Goal Achievement: 01/05/17 Potential to Achieve Goals: Fair Progress  towards PT goals: Progressing toward goals    Frequency    Min 4X/week      PT Plan Current plan remains appropriate    Co-evaluation              AM-PAC PT "6 Clicks" Daily Activity  Outcome Measure  Difficulty turning over in bed (including adjusting bedclothes, sheets and blankets)?: Unable Difficulty moving from lying on back to sitting on the side of the bed? : Unable Difficulty sitting down on and standing up from a chair with arms (e.g., wheelchair, bedside commode, etc,.)?: Unable Help needed moving to and from a bed to chair (including a wheelchair)?: A Little Help needed walking in hospital room?: A Little Help needed climbing 3-5 steps with a railing? : A Lot 6 Click Score: 11    End of Session Equipment Utilized During Treatment: Gait belt;Back brace Activity Tolerance: Patient limited by pain;Patient limited by lethargy;Patient limited by fatigue Patient left: in chair;with call bell/phone within reach;with chair alarm set Nurse Communication: Mobility status PT Visit Diagnosis: Unsteadiness on feet (R26.81);Other abnormalities of gait and mobility (R26.89);Muscle weakness (generalized) (M62.81);History of falling (Z91.81);Difficulty in walking, not elsewhere classified (R26.2);Pain Pain - part of body:  (back)     Time: 8299-3716 PT Time Calculation (min) (ACUTE ONLY): 39 min  Charges:  $Gait Training: 8-22 mins $Therapeutic Activity: 23-37 mins                    G Codes:       [place], [place], [place] 01/01/2017, 4:33 PM

## 2017-01-02 LAB — GLUCOSE, CAPILLARY
GLUCOSE-CAPILLARY: 267 mg/dL — AB (ref 65–99)
GLUCOSE-CAPILLARY: 278 mg/dL — AB (ref 65–99)
GLUCOSE-CAPILLARY: 347 mg/dL — AB (ref 65–99)
Glucose-Capillary: 88 mg/dL (ref 65–99)
Glucose-Capillary: 67 mg/dL (ref 65–99)
Glucose-Capillary: 96 mg/dL (ref 65–99)

## 2017-01-02 MED ORDER — POLYETHYLENE GLYCOL 3350 17 G PO PACK
17.0000 g | PACK | Freq: Every day | ORAL | Status: DC | PRN
  Administered 2017-01-04: 17 g via ORAL
  Filled 2017-01-02: qty 1

## 2017-01-02 NOTE — Progress Notes (Signed)
Pt noted with CBG of 69, currently eating breakfast.  Will recheck when breakfast is done.  Will continue to monitor.  Cori Razor, RN

## 2017-01-02 NOTE — Progress Notes (Signed)
Patient slept throughout night. When turned she would complain of pain. She states "It doesn't hurt if I don't move". Dressing clean dry and intact. Patient is still sleepy, but easily opens eyes to voice. Bed alarm on. Safety maintained.

## 2017-01-02 NOTE — Progress Notes (Signed)
Patient ID: Andrea [name], female   DOB: 23-Dec-1945, 71 y.o.   MRN: [id number] Subjective:  The patient is alert and pleasant. She looks much better today. Her daughter is at the bedside.  Objective: Vital signs in last 24 hours: Temp:  [98.3 F (36.8 C)-99.5 F (37.5 C)] 99.5 F (37.5 C) (10/28 0947) Pulse Rate:  [76-93] 87 (10/28 0947) Resp:  [16] 16 (10/28 0536) BP: (99-119)/(46-51) 113/46 (10/28 0947) SpO2:  [97 %-99 %] 99 % (10/28 0947)  Intake/Output from previous day: 10/27 0701 - 10/28 0700 In: 312 [P.O.:300; I.V.:12] Out: 300 [Urine:300] Intake/Output this shift: No intake/output data recorded.  Physical exam the patient is alert and oriented. She is moving her lower extremities well.  Lab Results:  Recent Labs  12/31/16 0548  WBC 11.2*  HGB 10.3*  HCT 33.8*  PLT 149*   BMET  Recent Labs  12/31/16 0548 01/01/17 0617  NA 135 135  K 4.8 4.5  CL 100* 100*  CO2 27 27  GLUCOSE 299* 179*  BUN 22* 24*  CREATININE 1.15* 1.11*  CALCIUM 8.6* 8.7*    Studies/Results: Dg Chest Port 1 View  Result Date: 01/01/2017 CLINICAL DATA:  Hypoxia EXAM: PORTABLE CHEST 1 VIEW COMPARISON:  07/08/2015 FINDINGS: The heart is enlarged. There are no focal consolidations or pleural effusions. No pulmonary edema. Mediastinal contour is accentuated by the AP portable position of the patient. IMPRESSION: Stable cardiomegaly.  No evidence for acute pulmonary abnormality. Electronically Signed   By: Nolon Nations M.D.   On: 01/01/2017 11:01    Assessment/Plan: Postop day #3: The patient is slowly making progress. It looks like she will need skilled nursing facility placement. Exoneration is agreeable.  Somnolence, hypoxia: Her chest x-ray was okay. I think she was a bit overmedicated. She is better today.  Renal insufficiency: She is at her baseline.  LOS: 3 days     Danilynn Jemison D 01/02/2017, 12:27 PM

## 2017-01-03 LAB — GLUCOSE, CAPILLARY
GLUCOSE-CAPILLARY: 188 mg/dL — AB (ref 65–99)
GLUCOSE-CAPILLARY: 147 mg/dL — AB (ref 65–99)
Glucose-Capillary: 167 mg/dL — ABNORMAL HIGH (ref 65–99)
Glucose-Capillary: 307 mg/dL — ABNORMAL HIGH (ref 65–99)
Glucose-Capillary: 75 mg/dL (ref 65–99)
Glucose-Capillary: 89 mg/dL (ref 65–99)

## 2017-01-03 MED ORDER — DOCUSATE SODIUM 100 MG PO CAPS: 100.0000 mg | ORAL_CAPSULE | Freq: Two times a day (BID) | ORAL | 0 refills | Status: DC

## 2017-01-03 MED ORDER — CYCLOBENZAPRINE HCL 5 MG PO TABS: 5.0000 mg | ORAL_TABLET | Freq: Three times a day (TID) | ORAL | 1 refills | Status: DC | PRN

## 2017-01-03 MED ORDER — OXYCODONE HCL 5 MG PO TABS: 5.0000 mg | ORAL_TABLET | ORAL | 0 refills | Status: DC | PRN

## 2017-01-03 NOTE — Discharge Summary (Addendum)
Physician Discharge Summary  Patient ID: AKEYA RYTHER MRN: [id number] DOB/AGE: 71-Oct-1947 71 y.o.  Admit date: 12/30/2016 Discharge date: 01/05/2017  Admission Diagnoses: L4-5 spondylolisthesis, lumbago, lumbar radiculopathy  Discharge Diagnoses: The same Active Problems:   Spondylolisthesis of lumbar region   Discharged Condition: good  Hospital Course: I performed an L4-5 decompression, instrumentation, and fusion on the patient on 12/30/2016. The surgery went well.  The patient's postoperative course was unremarkable. She was somewhat slow to mobilize. She was for made for her to go to a skilled nursing facility. The patient was given written and oral discharge instructions. All her questions were answered.  Consults: Physical therapy, care management Significant Diagnostic Studies: None Treatments: L4-5 decompression, agitation, and fusion. Discharge Exam: Blood pressure 115/70, pulse 83, temperature 98.5 F (36.9 C), temperature source Oral, resp. rate 18, height 5' (1.524 m), weight 77.3 kg (170 lb 8 oz), SpO2 92 %. The patient is alert and pleasant. She looks well. Her incision is healing well. Her strength is normal in her lower extremities.  Disposition: Skilled nursing facility  Discharge Instructions    Call MD for:  difficulty breathing, headache or visual disturbances    Complete by:  As directed    Call MD for:  extreme fatigue    Complete by:  As directed    Call MD for:  hives    Complete by:  As directed    Call MD for:  persistant dizziness or light-headedness    Complete by:  As directed    Call MD for:  persistant nausea and vomiting    Complete by:  As directed    Call MD for:  redness, tenderness, or signs of infection (pain, swelling, redness, odor or green/yellow discharge around incision site)    Complete by:  As directed    Call MD for:  severe uncontrolled pain    Complete by:  As directed    Call MD for:  temperature >100.4    Complete by:   As directed    Diet - low sodium heart healthy    Complete by:  As directed    Discharge instructions    Complete by:  As directed    Call [phone] for a followup appointment. Take a stool softener while you are using pain medications.   Driving Restrictions    Complete by:  As directed    Do not drive for 2 weeks.   Increase activity slowly    Complete by:  As directed    Lifting restrictions    Complete by:  As directed    Do not lift more than 5 pounds. No excessive bending or twisting.   May shower / Bathe    Complete by:  As directed    He may shower after the pain she is removed 3 days after surgery. Leave the incision alone.   No dressing needed    Complete by:  As directed      Allergies as of 01/05/2017      Reactions   Metformin And Related Diarrhea   Sulfa Antibiotics Rash      Medication List    STOP taking these medications   HYDROcodone-acetaminophen 10-325 MG tablet Commonly known as:  NORCO     TAKE these medications   albuterol 108 (90 Base) MCG/ACT inhaler Commonly known as:  PROVENTIL HFA;VENTOLIN HFA Inhale 2 puffs into the lungs every 6 (six) hours as needed for wheezing or shortness of breath.   Alcohol Prep Pads Use  twice daily prior to SQ injection of insulin to clean skin   amoxicillin-clavulanate 875-125 MG tablet Commonly known as:  AUGMENTIN Take 1 tablet by mouth 2 (two) times daily.   aspirin 81 MG tablet Take 81 mg by mouth daily.   atorvastatin 80 MG tablet Commonly known as:  LIPITOR Take 1 tablet (80 mg total) by mouth daily.   B-12 1000 MCG Caps Take 1,000 mcg by mouth daily.   cyclobenzaprine 5 MG tablet Commonly known as:  FLEXERIL Take 1 tablet (5 mg total) by mouth 3 (three) times daily as needed for muscle spasms.   docusate sodium 100 MG capsule Commonly known as:  COLACE Take 1 capsule (100 mg total) by mouth 2 (two) times daily.   Dulaglutide 0.75 MG/0.5ML Sopn Commonly known as:  TRULICITY Inject 6.30  mg into the skin once a week.   Fish Oil 1200 MG Caps Take 1,200 mg by mouth daily.   gabapentin 600 MG tablet Commonly known as:  NEURONTIN Take 1 tablet (600 mg total) by mouth 3 (three) times daily.   ICAPS AREDS 2 PO Take 1 capsule by mouth 2 (two) times daily.   insulin glargine 100 UNIT/ML injection Commonly known as:  LANTUS Inject 0.65 mLs (65 Units total) into the skin 2 (two) times daily with a meal.   Insulin Syringe-Needle U-100 29G 1 ML Misc Use to inject 65 units Lantus SQ BIDWC   losartan 50 MG tablet Commonly known as:  COZAAR Take 0.5 tablets (25 mg total) by mouth daily.   metoprolol succinate 25 MG 24 hr tablet Commonly known as:  TOPROL-XL Take 0.5 tablets (12.5 mg total) by mouth daily.   nitrofurantoin 100 MG capsule Commonly known as:  MACRODANTIN Take 1 capsule (100 mg total) by mouth at bedtime.   omeprazole 40 MG capsule Commonly known as:  PRILOSEC Take 1 capsule (40 mg total) by mouth daily.   ONETOUCH DELICA LANCETS 16W Misc To check blood sugar daily   ONETOUCH VERIO test strip Generic drug:  glucose blood To check blood sugar once daily   oxybutynin 10 MG 24 hr tablet Commonly known as:  DITROPAN-XL Take 1 tablet (10 mg total) by mouth at bedtime. What changed:  when to take this   oxyCODONE 5 MG immediate release tablet Commonly known as:  Oxy IR/ROXICODONE Take 1 tablet (5 mg total) by mouth every 3 (three) hours as needed.   sertraline 25 MG tablet Commonly known as:  ZOLOFT Take 1 tablet (25 mg total) by mouth daily.   terconazole 0.8 % vaginal cream Commonly known as:  TERAZOL 3 Place 1 applicator vaginally at bedtime. What changed:  when to take this  reasons to take this   Vitamin D (Ergocalciferol) 50000 units Caps capsule Commonly known as:  DRISDOL Take 1 capsule (50,000 Units total) by mouth every 7 (seven) days.      Contact information for after-discharge care    Destination    [facility] .    Specialty:  [place] information: [address] [place] [phone]              Signed: Traci Sermon 01/05/2017, 2:41 PM

## 2017-01-03 NOTE — NC FL2 (Signed)
[place] LEVEL OF CARE SCREENING TOOL     IDENTIFICATION  Patient Name: Andrea [name] Birthdate: 1945-05-20 Sex: female Admission Date (Current Location): 12/30/2016  [facility] and [place] Number:  Herbalist and Address:  [facility], [place] [address] [place], [place] [address]      Provider Number: [id number]  Attending Physician Name and Address:  Newman Pies, MD  Relative Name and Phone Number:       Current Level of Care: Hospital Recommended Level of Care: [place] Prior Approval Number:    Date Approved/Denied:   PASRR Number: [id number]  Discharge Plan: SNF    Current Diagnoses: Patient Active Problem List   Diagnosis Date Noted  . Spondylolisthesis of lumbar region 12/30/2016  . Obese 04/16/2015  . History of colon polyps 11/23/2012  . Healed myocardial infarct 11/23/2012  . Bundle branch block, 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  . 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 Absolute anemia 08/17/2012  . Chronic obstructive pulmonary disease ([place]) 08/17/2012  . HLD (hyperlipidemia) 08/17/2012  . OP (osteoporosis) 08/17/2012  . Type 2 diabetes mellitus ([place]) 08/17/2012  . Chronic systolic heart failure ([place]) 07/30/2012  . 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[place]) 07/24/2012    Orientation RESPIRATION BLADDER Height & Weight     Self, Time, Situation, Place  O2 ([place] 2L) Continent, Incontinent (incontinent at times) Weight: 170 lb 8 oz (77.3 kg) Height:  5' (152.4 cm)  BEHAVIORAL SYMPTOMS/MOOD NEUROLOGICAL BOWEL NUTRITION STATUS      Continent Diet (carb modified)  AMBULATORY STATUS COMMUNICATION OF NEEDS Skin   Limited Assist Verbally Surgical wounds (closed back incision (12/30/16), gauze and tape dressing)                       Personal Care Assistance Level of Assistance  Bathing, Feeding,  Dressing Bathing Assistance: Limited assistance Feeding assistance: Independent Dressing Assistance: Limited assistance     Functional Limitations Info             [place]  PT (By licensed PT), OT (By licensed OT)     PT Frequency: 5x/wk OT Frequency: 5x/wk            Contractures Contractures Info: Not present    Additional Factors Info  Code Status, Allergies, Psychotropic, Insulin Sliding Scale Code Status Info: Full Allergies Info: Metformin And Related, Sulfa Antibiotics Psychotropic Info: Zoloft 25mg  daily Insulin Sliding Scale Info: Insulin 0-20 units every 4 hours; Lantus 65 units 2x/day with meals       Current Medications (01/03/2017):  This is the current hospital active medication list Current Facility-Administered Medications  Medication Dose Route Frequency Provider Last Rate Last Dose  . 0.9 %  sodium chloride infusion  250 mL Intravenous Continuous Newman Pies, MD 1 mL/hr at 01/02/17 0548 250 mL at 01/02/17 0548  . acetaminophen (TYLENOL) tablet 650 mg  650 mg Oral Q4H PRN Newman Pies, MD   650 mg at 01/02/17 2112   Or  . acetaminophen (TYLENOL) suppository 650 mg  650 mg Rectal Q4H PRN Newman Pies, MD      . albuterol (PROVENTIL) (2.5 MG/3ML) 0.083% nebulizer solution 2.5 mg  2.5 mg Nebulization Q6H PRN Newman Pies, MD   2.5 mg at 01/01/17 2048  . atorvastatin (LIPITOR) tablet 80 mg  80 mg Oral Daily Newman Pies, MD   80 mg at 01/03/17  0915  . bisacodyl (DULCOLAX) suppository 10 mg  10 mg Rectal Daily PRN Newman Pies, MD      . cyclobenzaprine (FLEXERIL) tablet 5 mg  5 mg Oral TID PRN Newman Pies, MD   5 mg at 01/03/17 0650  . docusate sodium (COLACE) capsule 100 mg  100 mg Oral BID Newman Pies, MD   100 mg at 01/03/17 0915  . Dulaglutide SOPN 0.75 mg  0.75 mg Subcutaneous Weekly Newman Pies, MD      . gabapentin (NEURONTIN) tablet 300 mg  300 mg Oral TID Newman Pies, MD   300 mg at  01/03/17 0915  . HYDROcodone-acetaminophen (NORCO) 10-325 MG per tablet 1 tablet  1 tablet Oral Q4H PRN Newman Pies, MD   1 tablet at 01/03/17 [phone]  . insulin aspart (novoLOG) injection 0-20 Units  0-20 Units Subcutaneous Q4H Newman Pies, MD   4 Units at 01/03/17 0427  . insulin glargine (LANTUS) injection 65 Units  65 Units Subcutaneous BID WC Newman Pies, MD   65 Units at 01/03/17 0900  . losartan (COZAAR) tablet 25 mg  25 mg Oral Daily Newman Pies, MD   25 mg at 01/03/17 0916  . menthol-cetylpyridinium (CEPACOL) lozenge 3 mg  1 lozenge Oral PRN Newman Pies, MD       Or  . phenol [facility]) mouth spray 1 spray  1 spray Mouth/Throat PRN Newman Pies, MD      . metoprolol succinate (TOPROL-XL) 24 hr tablet 12.5 mg  12.5 mg Oral Daily Newman Pies, MD   12.5 mg at 01/03/17 0916  . morphine 2 MG/ML injection 2 mg  2 mg Intravenous Q2H PRN Newman Pies, MD      . ondansetron [facility]) tablet 4 mg  4 mg Oral Q6H PRN Newman Pies, MD       Or  . ondansetron [facility]) injection 4 mg  4 mg Intravenous Q6H PRN Newman Pies, MD      . oxybutynin (DITROPAN-XL) 24 hr tablet 10 mg  10 mg Oral QHS Newman Pies, MD   10 mg at 01/03/17 0009  . oxyCODONE (Oxy IR/ROXICODONE) immediate release tablet 5 mg  5 mg Oral Q3H PRN Newman Pies, MD   5 mg at 12/31/16 0709  . pantoprazole (PROTONIX) EC tablet 80 mg  80 mg Oral Daily Newman Pies, MD   80 mg at 01/03/17 0915  . polyethylene glycol (MIRALAX / GLYCOLAX) packet 17 g  17 g Oral Daily PRN Newman Pies, MD      . sertraline (ZOLOFT) tablet 25 mg  25 mg Oral Daily Newman Pies, MD   25 mg at 01/03/17 0915  . sodium chloride flush (NS) 0.9 % injection 3 mL  3 mL Intravenous Q12H Newman Pies, MD   3 mL at 01/03/17 1000  . sodium chloride flush (NS) 0.9 % injection 3 mL  3 mL Intravenous PRN Newman Pies, MD      . Vitamin D (Ergocalciferol) (DRISDOL) capsule 50,000 Units  50,000 Units Oral  Q7 days Newman Pies, MD   50,000 Units at 12/31/16 0506     Discharge Medications: Please see discharge summary for a list of discharge medications.  Relevant Imaging Results:  Relevant Lab Results:   Additional Information SS#: [id number]  Geralynn Ochs, LCSW

## 2017-01-03 NOTE — Progress Notes (Signed)
Physical Therapy Treatment Patient Details Name: Andrea [name] MRN: [id number] DOB: 11-11-1945 Today's Date: 01/03/2017    History of Present Illness The patient is a 71 year old white female who has complained of back, buttock and leg pain consistent with neurogenic claudication. 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     PT Comments    Pt progressing towards physical therapy goals. Was able to perform transfers and ambulation with gross min assist for balance support and safety. Pt with increased pain and difficulty maneuvering the RW. Replaced RW with youth sized RW and pt was able to manage better. Recommend youth sized walker at d/c. Will continue to follow and progress as able per POC.    Follow Up Recommendations  SNF;Supervision/Assistance - 24 hour     Equipment Recommendations  Rolling walker with 5" wheels (Youth size)    Recommendations for Other Services       Precautions / Restrictions Precautions Precautions: Back;Fall Precaution Booklet Issued: Yes (comment) Precaution Comments: Reviewed during functional mobility. Pt was able to recall 2/3 precautions without cues.  Required Braces or Orthoses: Spinal Brace Spinal Brace: Lumbar corset;Applied in sitting position Restrictions Weight Bearing Restrictions: No    Mobility  Bed Mobility Overal bed mobility: Needs Assistance Bed Mobility: Rolling;Sidelying to Sit Rolling: Min guard Sidelying to sit: Mod assist       General bed mobility comments: Increased time and effort with heavy use of rails. Assist provided to advance LE's off EOB and to elevate trunk to achieve full sitting position.  Transfers Overall transfer level: Needs assistance Equipment used: Rolling walker (2  wheeled) Transfers: Sit to/from Stand Sit to Stand: Min assist         General transfer comment: Assist to power-up to full standing position. VC's for hand placement on seated surface for safety. Pt was able to complete x4 sit<>stand transfers throughout session.   Ambulation/Gait Ambulation/Gait assistance: Min assist Ambulation Distance (Feet): 30 Feet Assistive device: Rolling walker (2 wheeled) Gait Pattern/deviations: Decreased step length - left;Shuffle;Decreased weight shift to left;Decreased stance time - right;Decreased stride length;Antalgic Gait velocity: Decreased Gait velocity interpretation: Below normal speed for age/gender General Gait Details: VC's for sequencing and general safety with RW. Pt required balance support during dynamic standing activity, but also trunk support in static standing due to increased pain. Pt attempting to lean over the side of the walker onto the sink for increased support, breaking precautions.    Stairs            Wheelchair Mobility    Modified Rankin (Stroke Patients Only)       Balance Overall balance assessment: Needs assistance Sitting-balance support: Feet supported;Bilateral upper extremity supported Sitting balance-Leahy Scale: Poor Sitting balance - Comments: bracing in pain   Standing balance support: Bilateral upper extremity supported Standing balance-Leahy Scale: Poor Standing balance comment: requires RW support for standing balance                            Cognition Arousal/Alertness: Awake/alert Behavior During Therapy: Anxious Overall Cognitive Status: Impaired/Different from baseline Area of Impairment: Memory;Safety/judgement;Problem solving                     Memory: Decreased recall of precautions;Decreased short-term memory   Safety/Judgement:  Decreased awareness of deficits;Decreased awareness of safety   Problem Solving: Difficulty sequencing;Requires verbal  cues;Requires tactile cues        Exercises      General Comments        Pertinent Vitals/Pain Pain Assessment: 0-10 Pain Score: 10-Worst pain ever Pain Location: back  Pain Descriptors / Indicators: Operative site guarding;Aching;Burning Pain Intervention(s): Limited activity within patient's tolerance;Monitored during session;Repositioned    Home Living Family/patient expects to be discharged to:: Private residence                    Prior Function            PT Goals (current goals can now be found in the care plan section) Acute Rehab PT Goals Patient Stated Goal: have less pain PT Goal Formulation: With patient Time For Goal Achievement: 01/05/17 Potential to Achieve Goals: Fair Progress towards PT goals: Progressing toward goals    Frequency    Min 5X/week      PT Plan Frequency needs to be updated    Co-evaluation              AM-PAC PT "6 Clicks" Daily Activity  Outcome Measure  Difficulty turning over in bed (including adjusting bedclothes, sheets and blankets)?: Unable Difficulty moving from lying on back to sitting on the side of the bed? : Unable Difficulty sitting down on and standing up from a chair with arms (e.g., wheelchair, bedside commode, etc,.)?: Unable Help needed moving to and from a bed to chair (including a wheelchair)?: A Little Help needed walking in hospital room?: A Little Help needed climbing 3-5 steps with a railing? : A Lot 6 Click Score: 11    End of Session Equipment Utilized During Treatment: Gait belt;Back brace Activity Tolerance: Patient limited by pain;Patient limited by lethargy;Patient limited by fatigue Patient left: in chair;with call bell/phone within reach;with chair alarm set Nurse Communication: Mobility status PT Visit Diagnosis: Unsteadiness on feet (R26.81);Other abnormalities of gait and mobility (R26.89);Muscle weakness (generalized) (M62.81);History of falling (Z91.81);Difficulty in  walking, not elsewhere classified (R26.2);Pain Pain - part of body:  (back)     Time: 1117-1140 PT Time Calculation (min) (ACUTE ONLY): 23 min  Charges:  $Gait Training: 23-37 mins                    G Codes:       Rolinda Roan, PT, DPT Acute Rehabilitation Services Pager: [phone] 01/03/2017, 11:59 AM

## 2017-01-03 NOTE — Clinical Social Work Note (Signed)
Clinical Social Work Assessment  Patient Details  Name: AVERYANNA [name] MRN: [id number] Date of Birth: 1945/05/31  Date of referral:  01/03/17               Reason for consult:  Facility Placement                Permission sought to share information with:  Facility Sport and exercise psychologist, Family Supports Permission granted to share information::  Yes, Verbal Permission Granted  Name::     Horald Pollen  Agency::  SNF  Relationship::  Husband, Daughter  Contact Information:     Housing/Transportation Living arrangements for the past 2 months:  Single Family Home Source of Information:  Patient Patient Interpreter Needed:  None Criminal Activity/Legal Involvement Pertinent to Current Situation/Hospitalization:  No - Comment as needed Significant Relationships:  Adult Children, Spouse Lives with:  Self, Spouse Do you feel safe going back to the place where you live?  Yes Need for family participation in patient care:  No (Coment)  Care giving concerns:  Patient has been living at home with spouse but will currently benefit from some post acute rehab in order to heal from surgery and return to previous level of functioning to return home with spouse support.   Social Worker assessment / plan:  CSW met with patient to discuss recommendation for SNF. CSW confirmed with patient that she had been to rehab in the past and would prefer to return to the same facility she had been to before. CSW completed referral information and sent over to [facility]. CSW left voicemail for admissions coordinator to see if patient would have a bed available for admission. CSW to follow.  Employment status:  Retired Nurse, adult PT Recommendations:  [place] / Referral to community resources:     Patient/Family's Response to care:  Patient agreeable to SNF placement.  Patient/Family's Understanding of and Emotional Response to Diagnosis, Current  Treatment, and Prognosis:  Patient discussed how enjoyable her previous SNF stay had been when she broke her hip a few years ago. Patient expressed that, while she was happy to return back home after her rehab ended, she was sad to leave the friends behind that she had made at the SNF. Patient hopeful that she can return and then get back home soon.  Emotional Assessment Appearance:    Attitude/Demeanor/Rapport:    Affect (typically observed):  Appropriate Orientation:  Oriented to Self, Oriented to Place, Oriented to  Time, Oriented to Situation Alcohol / Substance use:  Not Applicable Psych involvement (Current and /or in the community):  No (Comment)  Discharge Needs  Concerns to be addressed:  Care Coordination Readmission within the last 30 days:  No Current discharge risk:  Physical Impairment Barriers to Discharge:  Continued Medical Work up   [employer], [place] 01/03/2017, 11:16 AM

## 2017-01-03 NOTE — Care Management Important Message (Signed)
Important Message  Patient Details  Name: Andrea [name] MRN: [id number] Date of Birth: 1945-10-09   Medicare Important Message Given:  Yes    Jaggar Benko Abena 01/03/2017, 9:02 AM

## 2017-01-03 NOTE — Progress Notes (Signed)
CSW confirmed with [facility] that patient can admit, but facility will not have a bed until tomorrow. CSW alerted MD.  CSW will follow up tomorrow.  Laveda Abbe, [place] Clinical Social Worker [phone]

## 2017-01-03 NOTE — Progress Notes (Signed)
   01/03/17 1500  Clinical Encounter Type  Visited With Patient  Visit Type Initial  Referral From Chaplain  Consult/Referral To Chaplain  Spiritual Encounters  Spiritual Needs Prayer  Stress Factors  Patient Stress Factors Major life changes  Chaplain visited with PT during rounding.  PT was thankful for a chaplain visit b/c she stated she was lonely.  PT requested prayer and she talked about how she depends on the [place] for her healing.

## 2017-01-03 NOTE — Progress Notes (Addendum)
Inpatient Diabetes Program Recommendations  AACE/ADA: New Consensus Statement on Inpatient Glycemic Control (2015)  Target Ranges:  Prepandial:   less than 140 mg/dL      Peak postprandial:   less than 180 mg/dL (1-2 hours)      Critically ill patients:  140 - 180 mg/dL   Lab Results  Component Value Date   GLUCAP 75 01/03/2017   HGBA1C 10.4 (H) 12/30/2016    Review of Glycemic Control Results for Andrea, [name] (MRN [id number]) as of 01/03/2017 11:00  Ref. Range 01/02/2017 00:11 01/02/2017 05:35 01/02/2017 08:19 01/02/2017 11:17 01/02/2017 16:15 01/02/2017 20:25 01/03/2017 00:18 01/03/2017 04:21 01/03/2017 08:12  Glucose-Capillary Latest Ref Range: 65 - 99 mg/dL 278 (H) 88 67 96 267 (H) 347 (H) 307 (H) 167 (H) 75    Inpatient Diabetes Program Recommendations:    Patient with low glucose this am. Ordered home dose of lantus of 65 units bid and resistant q 4 hrs.  Patient now eating 100%.  Please consider changing correction to tidwc and HS correction scale.  Thank you Rosita Kea, RN, MSN, CDE  Diabetes Inpatient Program Office: [phone] Pager: [phone] 8:00 am to 5:00 pm

## 2017-01-04 ENCOUNTER — Other Ambulatory Visit: Payer: Self-pay

## 2017-01-04 ENCOUNTER — Encounter
Admission: RE | Admit: 2017-01-04 | Discharge: 2017-01-04 | Disposition: A | Discharge status: Home / Self Care | Payer: Medicare Other | Source: Ambulatory Visit | Attending: Internal Medicine | Admitting: Internal Medicine

## 2017-01-04 LAB — GLUCOSE, CAPILLARY
GLUCOSE-CAPILLARY: 91 mg/dL (ref 65–99)
GLUCOSE-CAPILLARY: 85 mg/dL (ref 65–99)
GLUCOSE-CAPILLARY: 176 mg/dL — AB (ref 65–99)
GLUCOSE-CAPILLARY: 199 mg/dL — AB (ref 65–99)
Glucose-Capillary: 118 mg/dL — ABNORMAL HIGH (ref 65–99)
Glucose-Capillary: 219 mg/dL — ABNORMAL HIGH (ref 65–99)

## 2017-01-04 MED ORDER — OXYCODONE HCL 5 MG PO TABS
5.0000 mg | ORAL_TABLET | ORAL | 0 refills | Status: DC | PRN
Start: 2017-01-04 — End: 2017-01-05

## 2017-01-04 MED ORDER — FLEET ENEMA 7-19 GM/118ML RE ENEM: 1.0000 | ENEMA | Freq: Once | RECTAL | Status: DC

## 2017-01-04 NOTE — Progress Notes (Signed)
Called [facility] nursing spoke to "Andrea [name]" as they are adamant about their per 3 day Bowel movement policy.  Patient had a bowel movement 4 days ago, states that this is normal for her and that she has not the urge to go at this time. Patient at this time refuses an enema when offered by this nurse.   [facility] called and notified of this, "Andrea [name]" stated that they are open and that patient may come anytime in the night as long as someone notifies them ahead of time but that patient will not be accepted unless patient has a bowel movement before transfer.   On call physician notified of events, ok if patient stays another night if no bowel movement produced will be addressed tomorrow.

## 2017-01-04 NOTE — Progress Notes (Signed)
Patient encouraged and  given Dulcolax suppository

## 2017-01-04 NOTE — Progress Notes (Signed)
Discharge instructions given to patient.  All questions and concerns addressed.  Patient medication picked up from pharmacy and given to patient to take with her.

## 2017-01-04 NOTE — Discharge Instructions (Signed)
Spinal Fusion, Adult, Care After This sheet gives you information about how to care for yourself after your procedure. Your health care provider may also give you more specific instructions. If you have problems or questions, contact your health care provider. What can I expect after the procedure? After the procedure, it is common to have:  Back pain and stiffness.  Pain in the incision area.  Follow these instructions at home: Medicines  Take over-the-counter and prescription medicines only as told by your health care provider. These include any pain medicines or blood thinning medicines (anticoagulants).  If you were prescribed an antibiotic medicine, take it as told by your health care provider. Do not stop taking the antibiotic even if you start to feel better.  Do not drive for 24 hours if you received a medicine to help you relax (sedative).  Do not drive or use heavy machinery while taking prescription pain medicine. If you have a brace:  Wear the brace as told by your health care provider. Remove it only as told by your health care provider.  Keep the brace clean. Managing pain, stiffness, and swelling  If directed, put ice on the injured area: ? If you have a removable brace, remove it as told by your health care provider. ? Put ice in a plastic bag. ? Place a towel between your skin and the bag. ? Leave the ice on for 20 minutes, 2-3 times a day. Incision care  Follow instructions from your health care provider about how to take care of your incision. Make sure you: ? Wash your hands with soap and water before you change your bandage (dressing). If soap and water are not available, use hand sanitizer. ? Change your dressing as told by your health care provider. ? Leave stitches (sutures), skin glue, or adhesive strips in place. These skin closures may need to be in place for 2 weeks or longer. If adhesive strip edges start to loosen and curl up, you may trim the loose  edges. Do not remove adhesive strips completely unless your health care provider tells you to do that.  Keep your incision clean and dry. Do not take baths, swim, or use a hot tub until your health care provider approves.  Check your incision area every day for signs of infection. Check for: ? More redness, swelling, or pain. ? Fluid or blood. ? Warmth. ? Pus or a bad smell. Physical activity  Rest and protect your back as much as possible.  Follow instructions from your health care provider about how to move and use good posture to help your spine heal.  Do not lift anything that is heavier than 8 lb (3.6 kg) or as told by your health care provider.  Do not twist or bend at the waist until your health care provider approves.  Avoid: ? Pushing and pulling motions. ? Lifting anything over your head. ? Sitting or lying down in the same position for long periods of time.  Do not exercise until your health care provider approves. Once your health care provider has approved exercise, ask him or her what kinds of exercises you can do to make your back stronger (physical therapy). General instructions  Wear compression stockings and walk at least every few hours as told by your health care provider. Doing this will help to prevent blood clots and reduce swelling in your legs.  Do not use any products that contain nicotine or tobacco, such as cigarettes and e-cigarettes. These can  delay bone healing. If you need help quitting, ask your health care provider.  To prevent or treat constipation while you are taking prescription pain medicine, your health care provider may recommend that you: ? Drink enough fluid to keep your urine clear or pale yellow. ? Take over-the-counter or prescription medicines. ? Eat foods that are high in fiber, such as fresh fruits and vegetables, whole grains, and beans. ? Limit foods that are high in fat and processed sugars, such as fried and sweet foods.  Keep  all follow-up visits as told by your health care provider. This is important. Contact a health care provider if:  You have pain that gets worse or does not get better with medicine.  Your legs or feet become painful or swollen.  You have more redness, swelling, or pain around your incision.  You have fluid or blood coming from your incision.  Your incision feels warm to the touch.  You have pus or a bad smell coming from your incision.  You have a fever.  You vomit or feel nausea.  You have weakness or numbness in your legs that is new or getting worse.  You have trouble controlling urination or bowel movements. Get help right away if:  You have severe pain.  You have chest pain.  You have trouble breathing.  You develop a cough. These symptoms may represent a serious problem that is an emergency. Do not wait to see if the symptoms will go away. Get medical help right away. Call your local emergency services (911 in the [place] Do not drive yourself to the hospital. Summary  It is common to have pain at the back and incision area.  Icing and pain medicines may help to control the pain. Follow directions from your health care provider.  Rest and protect your back as much as possible. Do not twist or bend at the waist. Get up and walk at least every few hours as told by your health care provider. This information is not intended to replace advice given to you by your health care provider. Make sure you discuss any questions you have with your health care provider. Document Released: 09/11/2004 Document Revised: 02/11/2016 Document Reviewed: 02/11/2016 Elsevier Interactive Patient Education  [employer].

## 2017-01-04 NOTE — Care Management Note (Signed)
Case Management Note  Patient Details  Name: GRAYLYN BUNNEY MRN: [id number] Date of Birth: [birth year]  Subjective/Objective:                    Action/Plan: Pt discharging to [facility] today. No further needs per CM.   Expected Discharge Date:  01/03/17               Expected Discharge Plan:  Skilled Nursing Facility  In-House Referral:  Clinical Social Work  Discharge planning Services     Post Acute Care Choice:    Choice offered to:     DME Arranged:    DME Agency:     HH Arranged:    [place] Agency:     Status of Service:  Completed, signed off  If discussed at [employer], dates discussed:    Additional Comments:  Pollie Friar, RN 01/04/2017, 11:34 AM

## 2017-01-04 NOTE — Progress Notes (Signed)
Patient given miralax for stool softener.  Patient refuse suppository, stated she doesn't need it as of yet.

## 2017-01-04 NOTE — Progress Notes (Signed)
Physical Therapy Treatment Patient Details Name: Andrea [name] MRN: [id number] DOB: [birth year] Today's Date: 01/04/2017    History of Present Illness The patient is a 71 year old white female who has complained of back, buttock and leg pain consistent with neurogenic claudication. 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     PT Comments    Pt progressing towards goals. Seemed less guarded this session and less anxious. Pt able to increase ambulation distance, however, continues to require min A for steadying assist. Current recommendations remain appropriate. Will continue to follow acutely to maximize functional mobility independence and safety.    Follow Up Recommendations  SNF;Supervision/Assistance - 24 hour     Equipment Recommendations  Rolling walker with 5" wheels (youth size )    Recommendations for Other Services       Precautions / Restrictions Precautions Precautions: Back;Fall Precaution Booklet Issued: Yes (comment) Precaution Comments: Pt able to recall 2/3 without cues and no lifting with cues.  Required Braces or Orthoses: Spinal Brace Spinal Brace: Lumbar corset;Applied in sitting position Restrictions Weight Bearing Restrictions: No    Mobility  Bed Mobility               General bed mobility comments: IN chair upon entry   Transfers Overall transfer level: Needs assistance Equipment used: Rolling walker (2 wheeled) Transfers: Sit to/from Stand Sit to Stand: Min assist         General transfer comment: Min A for steadying assist and for lift assist. Sit<>Stand X 3 during session. Verbal cues for safe hand placement.   Ambulation/Gait Ambulation/Gait assistance: Min assist Ambulation Distance (Feet): 40  Feet Assistive device: Rolling walker (2 wheeled) Gait Pattern/deviations: Decreased step length - left;Shuffle;Decreased weight shift to left;Decreased stance time - right;Decreased stride length;Antalgic Gait velocity: Decreased Gait velocity interpretation: Below normal speed for age/gender General Gait Details: Min A for steadying throughout. Verbal cues for sequencing with RW and for upright posture during gait.    Stairs            Wheelchair Mobility    Modified Rankin (Stroke Patients Only)       Balance Overall balance assessment: Needs assistance Sitting-balance support: Feet supported;No upper extremity supported Sitting balance-Leahy Scale: Fair Sitting balance - Comments: Less bracing this session    Standing balance support: Bilateral upper extremity supported Standing balance-Leahy Scale: Poor Standing balance comment: requires RW support for standing balance. Slight posterior lean upon standing initially.                             Cognition Arousal/Alertness: Awake/alert Behavior During Therapy: WFL for tasks assessed/performed Overall Cognitive Status: Impaired/Different from baseline Area of Impairment: Memory;Safety/judgement;Problem solving                     Memory: Decreased recall of precautions;Decreased short-term memory   Safety/Judgement: Decreased awareness of deficits;Decreased awareness of safety   Problem Solving: Difficulty sequencing;Requires verbal cues;Requires tactile cues        Exercises      General Comments General comments (skin integrity, edema, etc.): Educated about generalized walking program.       Pertinent Vitals/Pain Pain Assessment: 0-10 Pain Score: 8  Pain Location: back  Pain Descriptors / Indicators: Operative site guarding;Aching;Burning  Pain Intervention(s): Limited activity within patient's tolerance;Monitored during session;Repositioned    Home Living                       Prior Function            PT Goals (current goals can now be found in the care plan section) Acute Rehab PT Goals Patient Stated Goal: have less pain PT Goal Formulation: With patient Time For Goal Achievement: 01/05/17 Potential to Achieve Goals: Fair Progress towards PT goals: Progressing toward goals    Frequency    Min 5X/week      PT Plan Current plan remains appropriate    Co-evaluation              AM-PAC PT "6 Clicks" Daily Activity  Outcome Measure  Difficulty turning over in bed (including adjusting bedclothes, sheets and blankets)?: Unable Difficulty moving from lying on back to sitting on the side of the bed? : Unable Difficulty sitting down on and standing up from a chair with arms (e.g., wheelchair, bedside commode, etc,.)?: Unable Help needed moving to and from a bed to chair (including a wheelchair)?: A Little Help needed walking in hospital room?: A Little Help needed climbing 3-5 steps with a railing? : A Lot 6 Click Score: 11    End of Session Equipment Utilized During Treatment: Gait belt;Back brace Activity Tolerance: Patient limited by pain Patient left: in chair;with call bell/phone within reach;with chair alarm set Nurse Communication: Mobility status PT Visit Diagnosis: Unsteadiness on feet (R26.81);Other abnormalities of gait and mobility (R26.89);Muscle weakness (generalized) (M62.81);History of falling (Z91.81);Difficulty in walking, not elsewhere classified (R26.2);Pain Pain - part of body:  (back )     Time: 4801-6553 PT Time Calculation (min) (ACUTE ONLY): 18 min  Charges:  $Gait Training: 8-22 mins                    G Codes:       Leighton Ruff, PT, DPT  Acute Rehabilitation Services  Pager: [phone]    Rudean Hitt 01/04/2017, 3:57 PM

## 2017-01-04 NOTE — Progress Notes (Signed)
   01/04/17 1100  Clinical Encounter Type  Visited With Patient  Visit Type Follow-up  Consult/Referral To Chaplain  Spiritual Encounters  Spiritual Needs Prayer  Chaplain promised to revisit with the PT from yesterday at her request.  It turns out that the PT is going to a rehab place and her spirits have been lifted.  She was up sitting int he chair brushing her hair as if she was ready to go.  She is excited about getting back on her feet

## 2017-01-04 NOTE — Progress Notes (Signed)
CSW contacted by [facility] admissions coordinator that patient cannot admit until after a bowel movement; facility policy dictates that patient's must have had a bowel movement within 3 days in order to admit for SNF. CSW notified RN.  Facility can accept patient after a bowel movement; RN to call and update facility before patient's daughter provides transportation.  Laveda Abbe, [place] Clinical Social Worker [phone]

## 2017-01-04 NOTE — Progress Notes (Signed)
Patient ID: Andrea [name], female   DOB: 22-Sep-1945, 71 y.o.   MRN: [id number] Subjective: The patient is alert and pleasant. She looks well. We are awaiting a rehabilitation bed.  Objective: Vital signs in last 24 hours: Temp:  [98.3 F (36.8 C)-99.2 F (37.3 C)] 98.4 F (36.9 C) (10/30 0438) Pulse Rate:  [78-92] 78 (10/30 0438) Resp:  [16-20] 20 (10/30 0438) BP: (101-133)/(45-66) 122/49 (10/30 0438) SpO2:  [92 %-95 %] 94 % (10/30 0438)  Intake/Output from previous day: 10/29 0701 - 10/30 0700 In: 680 [P.O.:680] Out: -  Intake/Output this shift: No intake/output data recorded.  Physical exam the patient is alert and oriented. Strength is normal.  Lab Results: No results for input(s): WBC, HGB, HCT, PLT in the last 72 hours. BMET No results for input(s): NA, K, CL, CO2, GLUCOSE, BUN, CREATININE, CALCIUM in the last 72 hours.  Studies/Results: No results found.  Assessment/Plan: Subsequently #5: The patient is doing well. She is to go to a skilled nursing facility today. I gave her discharge instructions and answered all her questions.  LOS: 5 days     Afrika Brick D 01/04/2017, 7:55 AM

## 2017-01-04 NOTE — Progress Notes (Signed)
Report given to [facility].

## 2017-01-04 NOTE — Telephone Encounter (Signed)
Rx sent to [facility] phone : [phone] , fax : [phone]  

## 2017-01-04 NOTE — Clinical Social Work Placement (Signed)
Nurse to call report to [phone], Room 203A   CLINICAL SOCIAL WORK PLACEMENT  NOTE  Date:  01/04/2017  Patient Details  Name: Andrea [name] MRN: [id number] Date of Birth: [birth year]  Clinical Social Work is seeking post-discharge placement for this patient at the [place] level of care (*CSW will initial, date and re-position this form in  chart as items are completed):  Yes   Patient/family provided with [place] Work Department's list of facilities offering this level of care within the geographic area requested by the patient (or if unable, by the patient's family).  Yes   Patient/family informed of their freedom to choose among providers that offer the needed level of care, that participate in Medicare, Medicaid or managed care program needed by the patient, have an available bed and are willing to accept the patient.  Yes   Patient/family informed of [facility]'s ownership interest in [facility] and [facility], as well as of the fact that they are under no obligation to receive care at these facilities.  PASRR submitted to EDS on 01/03/17     PASRR number received on       Existing PASRR number confirmed on       FL2 transmitted to all facilities in geographic area requested by pt/family on       FL2 transmitted to all facilities within larger geographic area on 01/03/17     Patient informed that his/her managed care company has contracts with or will negotiate with certain facilities, including the following:        Yes   Patient/family informed of bed offers received.  Patient chooses bed at [facility]     Physician recommends and patient chooses bed at      Patient to be transferred to [facility] on 01/04/17.  Patient to be transferred to facility by [facility]     Patient family notified on 01/04/17 of transfer.  Name of family member notified:        PHYSICIAN       Additional Comment:     _______________________________________________ Geralynn Ochs, LCSW 01/04/2017, 11:55 AM

## 2017-01-05 LAB — GLUCOSE, CAPILLARY
GLUCOSE-CAPILLARY: 135 mg/dL — AB (ref 65–99)
GLUCOSE-CAPILLARY: 70 mg/dL (ref 65–99)
Glucose-Capillary: 73 mg/dL (ref 65–99)

## 2017-01-05 MED ORDER — OXYCODONE HCL 5 MG PO TABS: 5.0000 mg | ORAL_TABLET | ORAL | 0 refills | Status: DC | PRN

## 2017-01-05 MED ORDER — OXYCODONE HCL 5 MG PO TABS
5.0000 mg | ORAL_TABLET | ORAL | 0 refills | Status: DC | PRN
Start: 2017-01-05 — End: 2017-01-05

## 2017-01-05 NOTE — Progress Notes (Signed)
Patient ID: Andrea [name], female   DOB: 1946/01/05, 71 y.o.   MRN: [id number] Subjective:  The patient is alert and pleasant. She hasn't had a bowel movement today. We are awaiting a rehabilitation bed. I am told the rehabilitation facility will take her she's had a bowel movement.  Objective: Vital signs in last 24 hours: Temp:  [98.1 F (36.7 C)-99.3 F (37.4 C)] 99.3 F (37.4 C) (10/31 0401) Pulse Rate:  [76-87] 82 (10/31 0401) Resp:  [16-20] 20 (10/31 0401) BP: (102-126)/(39-49) 124/44 (10/31 0401) SpO2:  [92 %-96 %] 96 % (10/31 0401)  Intake/Output from previous day: 10/30 0701 - 10/31 0700 In: 720 [P.O.:720] Out: 500 [Urine:500] Intake/Output this shift: No intake/output data recorded.  Physical exam the patient is alert and oriented. She looks well. Her abdomen is soft. Her wound is healing well. Her strength is normal.  Lab Results: No results for input(s): WBC, HGB, HCT, PLT in the last 72 hours. BMET No results for input(s): NA, K, CL, CO2, GLUCOSE, BUN, CREATININE, CALCIUM in the last 72 hours.  Studies/Results: No results found.  Assessment/Plan: Postop day #6: The patient has been ready for transfer to skilled nursing facility for the last 2 days. We are awaiting placement.  LOS: 6 days     Andrea [name] D 01/05/2017, 8:19 AM

## 2017-01-05 NOTE — Progress Notes (Addendum)
Patient d/c to [facility], d/c instructions done with teach back, pt verbalize understanding, no new concerns, pt had 2 bowel movement after soap sud enema. Report given to Rocky Link LPN. Pt will be transported from Hospital by sister.

## 2017-01-06 ENCOUNTER — Encounter
Admission: RE | Admit: 2017-01-06 | Discharge: 2017-01-06 | Disposition: A | Discharge status: Home / Self Care | Payer: Medicare Other | Source: Ambulatory Visit | Attending: Internal Medicine | Admitting: Internal Medicine

## 2017-01-06 DIAGNOSIS — M48061 Spinal stenosis, lumbar region without neurogenic claudication: Secondary | ICD-10-CM | POA: Insufficient documentation

## 2017-01-06 DIAGNOSIS — I2510 Atherosclerotic heart disease of native coronary artery without angina pectoris: Secondary | ICD-10-CM | POA: Insufficient documentation

## 2017-01-06 DIAGNOSIS — K5903 Drug induced constipation: Secondary | ICD-10-CM | POA: Insufficient documentation

## 2017-01-06 DIAGNOSIS — I25118 Atherosclerotic heart disease of native coronary artery with other forms of angina pectoris: Secondary | ICD-10-CM | POA: Insufficient documentation

## 2017-01-11 ENCOUNTER — Non-Acute Institutional Stay (SKILLED_NURSING_FACILITY): Payer: Medicare Other | Admitting: Gerontology

## 2017-01-11 DIAGNOSIS — T148XXA Other injury of unspecified body region, initial encounter: Secondary | ICD-10-CM

## 2017-01-12 ENCOUNTER — Encounter: Payer: Self-pay | Admitting: Gerontology

## 2017-01-12 NOTE — Progress Notes (Signed)
Location:    [place] Room Number: [place] of Service:  SNF [phone]) Provider:  Toni Arthurs, NP-C  Marlyn Corporal Clearnce Sorrel, PA-C  Patient Care Team: Rubye Beach as PCP - General (Family Medicine) Dingeldein, Remo Lipps, MD as Consulting Physician (Ophthalmology) Newman Pies, MD as Consulting Physician (Neurosurgery)  Extended Emergency Contact Information Primary Emergency Contact: Ephriam Jenkins Address: [address]          [place], [place] [address] [place] Phone: [phone] Relation: Spouse Secondary Emergency Contact: Darlina Sicilian Address: [address]          [place], [place] [address] [place] Phone: [phone] Mobile Phone: [phone] Relation: Daughter  Code Status: FULL Goals of care: Advanced Directive information Advanced Directives 01/12/2017  Does Patient Have a Medical Advance Directive? No  Would patient like information on creating a medical advance directive? No - Patient declined     Chief Complaint  Patient presents with  . Acute Visit    Recheck incision    HPI:  Pt is a 71 y.o. female seen today for an acute visit for evaluation of incision. Pt is concerned about tenderness around the incision on her back. Staff has noticed a minor amount of dark red blood on the dressing and minor swelling around the incision site. On exam, the peri-incisional tissue is without redness, warmth. Incision is well approximated except for small area on distal incision. Minor swelling around the incision. Very small amount of dark, red blood expressed from incision. Pt denies significant pain or neuropathies. No weakness. No fevers. No signs of infection. Otherwise, pt reports she is feeling fine and pain is well controlled. VSS. No other complaints.    Past Medical History:  Diagnosis Date  . Bronchitis   . Chickenpox   . Coronary artery disease   . Depression   .  Diabetes mellitus type 2, uncomplicated ([place])   . Diabetes mellitus without complication ([place])   . Diabetic retinopathy ([place])   . Dupuytren contracture   . Frequent urinary tract infections   . GERD (gastroesophageal reflux disease)   . History of hand surgery 2011   left hand  . Hyperlipidemia, unspecified   . Irritable bowel syndrome   . MI (myocardial infarction) ([place]) 1994  . Neuromuscular disorder ([place])    nerve pain in back and legs  . Osteoporosis   . Pneumonia 2014  . Stroke ([place])   . TIA (transient ischemic attack)   . Vitamin D deficiency, unspecified   . Wheezing    Past Surgical History:  Procedure Laterality Date  . CARDIAC CATHETERIZATION  1994  . COLONOSCOPY     x3  . CORONARY ANGIOPLASTY  1994  . EYE SURGERY     cataracts  . HAND SURGERY Left 2011   left hand release of contractures  . HIP FRACTURE SURGERY  11/2013  . PR COLSC FLX W/REMOVAL LESION BY HOT BX FORCEPS   08/21/2015   Procedure: COLONOSCOPY, FLEXIBLE, PROXIMAL TO SPLENIC FLEXURE; W/REMOVAL TUMOR/POLYP/OTHER LESION, HOT BX FORCEP/CAUTE; Surgeon: Carlena Hurl, MD; Location: [facility]; Service: General Surgery    Allergies  Allergen Reactions  . Metformin And Related Diarrhea  . Sulfa Antibiotics Rash    Allergies as of 01/11/2017      Reactions   Metformin And Related Diarrhea   Sulfa Antibiotics Rash      Medication List  Accurate as of 01/11/17 11:59 PM. Always use your most recent med list.          acetaminophen 325 MG tablet Commonly known as:  TYLENOL Take 650 mg 4 (four) times daily by mouth.   albuterol 108 (90 Base) MCG/ACT inhaler Commonly known as:  PROVENTIL HFA;VENTOLIN HFA Inhale 2 puffs into the lungs every 6 (six) hours as needed for wheezing or shortness of breath.   Alcohol Prep Pads Use twice daily prior to SQ injection of insulin to clean skin   aspirin EC 81 MG tablet Take 81 mg daily by mouth.   atorvastatin 80 MG tablet Commonly  known as:  LIPITOR Take 80 mg daily by mouth.   atorvastatin 80 MG tablet Commonly known as:  LIPITOR Take 1 tablet (80 mg total) by mouth daily.   B-12 1000 MCG Caps Take 1,000 mcg by mouth daily.   cyclobenzaprine 5 MG tablet Commonly known as:  FLEXERIL Take 1 tablet (5 mg total) by mouth 3 (three) times daily as needed for muscle spasms.   docusate sodium 100 MG capsule Commonly known as:  COLACE Take 1 capsule (100 mg total) by mouth 2 (two) times daily.   ergocalciferol 50000 units capsule Commonly known as:  VITAMIN D2 Take 50,000 Units once a week by mouth. On Fridays   Fish Oil 1200 MG Caps Take 1,200 mg by mouth daily.   gabapentin 600 MG tablet Commonly known as:  NEURONTIN Take 1 tablet (600 mg total) by mouth 3 (three) times daily.   ICAPS AREDS 2 PO Take 1 capsule by mouth 2 (two) times daily.   insulin glargine 100 UNIT/ML injection Commonly known as:  LANTUS Inject 0.65 mLs (65 Units total) into the skin 2 (two) times daily with a meal.   Insulin Syringe-Needle U-100 29G 1 ML Misc Use to inject 65 units Lantus SQ BIDWC   losartan 25 MG tablet Commonly known as:  COZAAR Take 25 mg daily by mouth.   metoprolol succinate 25 MG 24 hr tablet Commonly known as:  TOPROL-XL Take 0.5 tablets (12.5 mg total) by mouth daily.   nitrofurantoin 100 MG capsule Commonly known as:  MACRODANTIN Take 100 mg at bedtime by mouth.   omeprazole 40 MG capsule Commonly known as:  PRILOSEC Take 1 capsule (40 mg total) by mouth daily.   ONETOUCH DELICA LANCETS 64Q Misc To check blood sugar daily   ONETOUCH VERIO test strip Generic drug:  glucose blood To check blood sugar once daily   oxybutynin 10 MG 24 hr tablet Commonly known as:  DITROPAN-XL Take 10 mg at bedtime by mouth.   oxyCODONE 5 MG immediate release tablet Commonly known as:  Oxy IR/ROXICODONE Take 1 tablet (5 mg total) by mouth every 3 (three) hours as needed.   senna 8.6 MG Tabs  tablet Commonly known as:  SENOKOT Take 2 tablets 2 (two) times daily by mouth.   sertraline 25 MG tablet Commonly known as:  ZOLOFT Take 25 mg at bedtime by mouth.   terconazole 0.8 % vaginal cream Commonly known as:  TERAZOL 3 Place 1 applicator at bedtime vaginally. Give 3 days and hold 4 days , for vaginal yeast infection ( per home regimen )   TRULICITY 0.34 VQ/2.5ZD Sopn Generic drug:  Dulaglutide Inject 0.75 mg once a week into the skin. On Monday       Review of Systems  Constitutional: Negative for activity change, appetite change, chills, diaphoresis and fever.  HENT: Negative for  congestion, sneezing, sore throat, trouble swallowing and voice change.   Respiratory: Negative for apnea, cough, choking, chest tightness, shortness of breath and wheezing.   Cardiovascular: Negative for chest pain, palpitations and leg swelling.  Gastrointestinal: Negative for abdominal distention, abdominal pain, constipation, diarrhea and nausea.  Genitourinary: Negative for difficulty urinating, dysuria, frequency and urgency.  Musculoskeletal: Positive for arthralgias (typical arthritis) and back pain. Negative for gait problem and myalgias.  Skin: Positive for wound. Negative for color change, pallor and rash.  Neurological: Negative for dizziness, tremors, syncope, speech difficulty, weakness, numbness and headaches.  Psychiatric/Behavioral: Negative for agitation and behavioral problems.  All other systems reviewed and are negative.   Immunization History  Administered Date(s) Administered  . Influenza, High Dose Seasonal PF 02/12/2016, 12/16/2016   Pertinent  Health Maintenance Due  Topic Date Due  . DEXA SCAN  01/16/2017 (Originally 11/26/2016)  . HEMOGLOBIN A1C  04/01/2017  . OPHTHALMOLOGY EXAM  07/19/2017  . MAMMOGRAM  11/26/2017  . FOOT EXAM  12/16/2017  . COLONOSCOPY  08/21/2018  . INFLUENZA VACCINE  Completed  . PNA vac Low Risk Adult  Completed   Fall Risk   12/16/2016 11/11/2015  Falls in the past year? No No   Functional Status Survey:    Vitals:   01/11/17 1031  BP: (!) 135/58  Pulse: 91  Resp: 20  Temp: 98.1 F (36.7 C)  TempSrc: Oral  SpO2: 90%  Weight: 177 lb 8 oz (80.5 kg)  Height: 5' (1.524 m)   Body mass index is 34.67 kg/m. Physical Exam  Constitutional: She is oriented to person, place, and time. Vital signs are normal. She appears well-developed and well-nourished. She is active and cooperative. She does not appear ill. No distress.  HENT:  Head: Normocephalic and atraumatic.  Mouth/Throat: Uvula is midline, oropharynx is clear and moist and mucous membranes are normal. Mucous membranes are not pale, not dry and not cyanotic.  Eyes: Conjunctivae, EOM and lids are normal. Pupils are equal, round, and reactive to light.  Neck: Trachea normal, normal range of motion and full passive range of motion without pain. Neck supple. No JVD present. No tracheal deviation, no edema and no erythema present. No thyromegaly present.  Cardiovascular: Normal rate, regular rhythm, normal heart sounds, intact distal pulses and normal pulses. Exam reveals no gallop, no distant heart sounds and no friction rub.  No murmur heard. Pulses:      Dorsalis pedis pulses are 2+ on the right side, and 2+ on the left side.  No edema  Pulmonary/Chest: Effort normal and breath sounds normal. No accessory muscle usage. No respiratory distress. She has no decreased breath sounds. She has no wheezes. She has no rhonchi. She has no rales. She exhibits no tenderness.  Abdominal: Soft. Normal appearance and bowel sounds are normal. She exhibits no distension and no ascites. There is no tenderness.  Musculoskeletal: She exhibits no edema or tenderness.       Lumbar back: She exhibits decreased range of motion, swelling and laceration.  Expected osteoarthritis, stiffness; generalized weakness; calves soft, supple. Negative Homan's sign.  Neurological: She is alert  and oriented to person, place, and time. She has normal strength.  Skin: Skin is warm and dry. Laceration noted. She is not diaphoretic. No cyanosis. No pallor. Nails show no clubbing.     Psychiatric: She has a normal mood and affect. Her speech is normal and behavior is normal. Judgment and thought content normal. Cognition and memory are normal.  Nursing note  and vitals reviewed.   Labs reviewed: Recent Labs    12/28/16 1155 12/31/16 0548 01/01/17 0617  NA 138 135 135  K 4.4 4.8 4.5  CL 103 100* 100*  CO2 28 27 27   GLUCOSE 203* 299* 179*  BUN 16 22* 24*  CREATININE 0.97 1.15* 1.11*  CALCIUM 9.5 8.6* 8.7*   Recent Labs    12/16/16 1539  AST 19  ALT 21  BILITOT 0.7  PROT 6.9   Recent Labs    12/16/16 1539 12/28/16 1155 12/31/16 0548  WBC 10.5 6.1 11.2*  NEUTROABS 6,069  --   --   HGB 12.9 13.6 10.3*  HCT 39.4 43.5 33.8*  MCV 89.5 94.0 94.7  PLT 344 175 149*   Lab Results  Component Value Date   TSH 1.59 12/16/2016   Lab Results  Component Value Date   HGBA1C 10.4 (H) 12/30/2016   Lab Results  Component Value Date   CHOL 144 12/16/2016   HDL 67 12/16/2016   LDLCALC 48 09/02/2015   TRIG 133 12/16/2016   CHOLHDL 2.1 12/16/2016    Significant Diagnostic Results in last 30 days:  Dg Lumbar Spine 2-3 Views  Result Date: 12/30/2016 CLINICAL DATA:  Posterior fusion at L4-5 EXAM: LUMBAR SPINE - 2-3 VIEW COMPARISON:  Lumbar spine films 11/16/2016 FINDINGS: Two C-arm spot films show placement of pedicular screws at L4 on L5 with interbody fusion plug in good position for fusion of L4-5. No complicating features are seen. IMPRESSION: Hardware placed for posterior fusion of L4-5. Electronically Signed   By: Ivar Drape M.D.   On: 12/30/2016 15:27   Dg Lumbar Spine 1 View  Result Date: 12/30/2016 CLINICAL DATA:  Lumbar spine surgery. EXAM: LUMBAR SPINE - 1 VIEW COMPARISON:  MRI 11/04/2016. FINDINGS: MRI scratched it lumbar spine numbered as per prior exam.  Metallic marker noted posteriorly at L4-L5 disc space level. IMPRESSION: Metallic marker noted posteriorly at the L4-L5 disc space level. Electronically Signed   By: Marcello Moores  Register   On: 12/30/2016 13:17   Dg Chest Port 1 View  Result Date: 01/01/2017 CLINICAL DATA:  Hypoxia EXAM: PORTABLE CHEST 1 VIEW COMPARISON:  07/08/2015 FINDINGS: The heart is enlarged. There are no focal consolidations or pleural effusions. No pulmonary edema. Mediastinal contour is accentuated by the AP portable position of the patient. IMPRESSION: Stable cardiomegaly.  No evidence for acute pulmonary abnormality. Electronically Signed   By: Nolon Nations M.D.   On: 01/01/2017 11:01   Dg C-arm 1-60 Min  Result Date: 12/30/2016 CLINICAL DATA:  Posterior fusion at L4-5 EXAM: DG C-ARM 61-120 MIN COMPARISON:  Lumbar spine films of 11/16/2016 FINDINGS: C-arm fluoroscopy was provided during posterior fusion at L4-5. Fluoroscopy time of 30 seconds was recorded. IMPRESSION: C-arm fluoroscopy provided. Electronically Signed   By: Ivar Drape M.D.   On: 12/30/2016 15:26    Assessment/Plan 1. Subcutaneous Hematoma  Compression dressing to incision. Change Q day  Software engineer of findings  Family/ staff Communication:   Total Time:  Documentation:  Face to Face:  Family/Phone:   Labs/tests ordered:    Medication list reviewed and assessed for continued appropriateness.  Vikki Ports, NP-C Geriatrics [facility] [phone], [place] [address] Cell Phone (Mon-Fri 8am-5pm):  [phone] On Call:  [phone] & follow prompts after 5pm & weekends Office Phone:  [phone] Office Fax:  [phone]

## 2017-01-18 ENCOUNTER — Encounter: Payer: Self-pay | Admitting: Gerontology

## 2017-01-18 ENCOUNTER — Non-Acute Institutional Stay (SKILLED_NURSING_FACILITY): Payer: Medicare Other | Admitting: Gerontology

## 2017-01-18 DIAGNOSIS — Z981 Arthrodesis status: Secondary | ICD-10-CM

## 2017-01-18 DIAGNOSIS — M4316 Spondylolisthesis, lumbar region: Secondary | ICD-10-CM

## 2017-01-18 NOTE — Progress Notes (Signed)
Location:   [facility] Room Number: 209B Place of Service:  SNF [phone]) Provider:  Toni Arthurs, NP-C  Marlyn Corporal Clearnce Sorrel, PA-C  Patient Care Team: Rubye Beach as PCP - General (Family Medicine) Dingeldein, Remo Lipps, MD as Consulting Physician (Ophthalmology) Newman Pies, MD as Consulting Physician (Neurosurgery)  Extended Emergency Contact Information Primary Emergency Contact: Ephriam Jenkins Address: [address] [place], [place] [address] [place] Phone: [phone] Relation: Spouse Secondary Emergency Contact: Darlina Sicilian Address: [place]          [place], [place] [address] [place] Phone: [phone] Mobile Phone: [phone] Relation: Daughter  Code Status:  FULL Goals of care: Advanced Directive information Advanced Directives 01/18/2017  Does Patient Have a Medical Advance Directive? No  Would patient like information on creating a medical advance directive? -     Chief Complaint  Patient presents with  . Medical Management of Chronic Issues    Routine Visit    HPI:  Pt is a 71 y.o. female seen today for medical management of chronic diseases.  Patient was admitted to the facility for rehab following hospitalization for lumbar fusion due to spondylolisthesis of the lumbar region.  patient has been participating in PT and OT.  She is ambulatory with a walker.  Patient has been progressing well.  Patient reports her pain is well controlled with current regimen.  On last visit, patient had a small hematoma under the incision site.  This seems to have resolved.  Incision is now well approximated, dressing CDI. No noted deficits. Back brace in place. Appetite is good, voiding well and have regular BMs. VSS. No other complaints.    Past Medical History:  Diagnosis Date  . Bronchitis   . Chickenpox   . Coronary artery disease   . Depression   . Diabetes  mellitus type 2, uncomplicated ([place])   . Diabetes mellitus without complication ([place])   . Diabetic retinopathy ([place])   . Dupuytren contracture   . Frequent urinary tract infections   . GERD (gastroesophageal reflux disease)   . History of hand surgery 2011   left hand  . Hyperlipidemia, unspecified   . Irritable bowel syndrome   . MI (myocardial infarction) ([place]) 1994  . Neuromuscular disorder ([place])    nerve pain in back and legs  . Osteoporosis   . Pneumonia 2014  . Stroke ([place])   . TIA (transient ischemic attack)   . Vitamin D deficiency, unspecified   . Wheezing    Past Surgical History:  Procedure Laterality Date  . CARDIAC CATHETERIZATION  1994  . COLONOSCOPY     x3  . CORONARY ANGIOPLASTY  1994  . EYE SURGERY     cataracts  . HAND SURGERY Left 2011   left hand release of contractures  . HIP FRACTURE SURGERY  11/2013  . PR COLSC FLX W/REMOVAL LESION BY HOT BX FORCEPS   08/21/2015   Procedure: COLONOSCOPY, FLEXIBLE, PROXIMAL TO SPLENIC FLEXURE; W/REMOVAL TUMOR/POLYP/OTHER LESION, HOT BX FORCEP/CAUTE; Surgeon: Carlena Hurl, MD; Location: [facility]; Service: General Surgery    Allergies  Allergen Reactions  . Metformin And Related Diarrhea  . Sulfa Antibiotics Rash    Allergies as of 01/18/2017      Reactions   Metformin And Related Diarrhea   Sulfa Antibiotics Rash      Medication List  Accurate as of 01/18/17 11:17 AM. Always use your most recent med list.          acetaminophen 325 MG tablet Commonly known as:  TYLENOL Take 650 mg 4 (four) times daily by mouth.   albuterol 108 (90 Base) MCG/ACT inhaler Commonly known as:  PROVENTIL HFA;VENTOLIN HFA Inhale 2 puffs into the lungs every 6 (six) hours as needed for wheezing or shortness of breath.   Alcohol Prep Pads Use twice daily prior to SQ injection of insulin to clean skin   aspirin EC 81 MG tablet Take 81 mg daily by mouth.   atorvastatin 80 MG tablet Commonly known as:   LIPITOR Take 80 mg daily by mouth.   B-12 1000 MCG Caps Take 1,000 mcg by mouth daily.   cyclobenzaprine 5 MG tablet Commonly known as:  FLEXERIL Take 1 tablet (5 mg total) by mouth 3 (three) times daily as needed for muscle spasms.   docusate sodium 100 MG capsule Commonly known as:  COLACE Take 1 capsule (100 mg total) by mouth 2 (two) times daily.   ergocalciferol 50000 units capsule Commonly known as:  VITAMIN D2 Take 50,000 Units once a week by mouth. On Fridays   Fish Oil 1200 MG Caps Take 1,200 mg by mouth daily.   gabapentin 600 MG tablet Commonly known as:  NEURONTIN Take 1 tablet (600 mg total) by mouth 3 (three) times daily.   ICAPS AREDS 2 PO Take 1 capsule by mouth 2 (two) times daily.   insulin glargine 100 UNIT/ML injection Commonly known as:  LANTUS Inject 0.65 mLs (65 Units total) into the skin 2 (two) times daily with a meal.   Insulin Syringe-Needle U-100 29G 1 ML Misc Use to inject 65 units Lantus SQ BIDWC   losartan 25 MG tablet Commonly known as:  COZAAR Take 25 mg daily by mouth.   metoprolol succinate 25 MG 24 hr tablet Commonly known as:  TOPROL-XL Take 0.5 tablets (12.5 mg total) by mouth daily.   nitrofurantoin 100 MG capsule Commonly known as:  MACRODANTIN Take 100 mg at bedtime by mouth.   omeprazole 40 MG capsule Commonly known as:  PRILOSEC Take 1 capsule (40 mg total) by mouth daily.   ONETOUCH DELICA LANCETS 96E Misc To check blood sugar daily   ONETOUCH VERIO test strip Generic drug:  glucose blood To check blood sugar once daily   oxybutynin 10 MG 24 hr tablet Commonly known as:  DITROPAN-XL Take 10 mg at bedtime by mouth.   oxyCODONE 5 MG immediate release tablet Commonly known as:  Oxy IR/ROXICODONE Take 1 tablet (5 mg total) by mouth every 3 (three) hours as needed.   senna 8.6 MG Tabs tablet Commonly known as:  SENOKOT Take 2 tablets 2 (two) times daily by mouth.   sertraline 25 MG tablet Commonly known as:   ZOLOFT Take 25 mg at bedtime by mouth.   terconazole 0.8 % vaginal cream Commonly known as:  TERAZOL 3 Place 1 applicator at bedtime vaginally. Give 3 days and hold 4 days , for vaginal yeast infection ( per home regimen )   TRULICITY 9.52 WU/1.3KG Sopn Generic drug:  Dulaglutide Inject 0.75 mg once a week into the skin. On Monday       Review of Systems  Constitutional: Negative for activity change, appetite change, chills, diaphoresis and fever.  HENT: Negative for congestion, sneezing, sore throat, trouble swallowing and voice change.   Respiratory: Negative for apnea, cough, choking, chest tightness, shortness  of breath and wheezing.   Cardiovascular: Negative for chest pain, palpitations and leg swelling.  Gastrointestinal: Negative for abdominal distention, abdominal pain, constipation, diarrhea and nausea.  Genitourinary: Negative for difficulty urinating, dysuria, frequency and urgency.  Musculoskeletal: Positive for arthralgias (typical arthritis) and back pain. Negative for gait problem and myalgias.  Skin: Positive for wound. Negative for color change, pallor and rash.  Neurological: Negative for dizziness, tremors, syncope, speech difficulty, weakness, numbness and headaches.  Psychiatric/Behavioral: Negative for agitation and behavioral problems.  All other systems reviewed and are negative.   Immunization History  Administered Date(s) Administered  . Influenza, High Dose Seasonal PF 02/12/2016, 12/16/2016   Pertinent  Health Maintenance Due  Topic Date Due  . DEXA SCAN  11/26/2016  . HEMOGLOBIN A1C  04/01/2017  . OPHTHALMOLOGY EXAM  07/19/2017  . MAMMOGRAM  11/26/2017  . FOOT EXAM  12/16/2017  . COLONOSCOPY  08/21/2018  . INFLUENZA VACCINE  Completed  . PNA vac Low Risk Adult  Completed   Fall Risk  12/16/2016 11/11/2015  Falls in the past year? No No   Functional Status Survey:    Vitals:   01/18/17 1103  BP: 130/63  Pulse: 84  Resp: 16  Temp: 98.6  F (37 C)  TempSrc: Oral  SpO2: 96%  Weight: 173 lb 4.8 oz (78.6 kg)  Height: 5' (1.524 m)   Body mass index is 33.85 kg/m. Physical Exam  Constitutional: She is oriented to person, place, and time. Vital signs are normal. She appears well-developed and well-nourished. She is active and cooperative. She does not appear ill. No distress.  HENT:  Head: Normocephalic and atraumatic.  Mouth/Throat: Uvula is midline, oropharynx is clear and moist and mucous membranes are normal. Mucous membranes are not pale, not dry and not cyanotic.  Eyes: Conjunctivae, EOM and lids are normal. Pupils are equal, round, and reactive to light.  Neck: Trachea normal, normal range of motion and full passive range of motion without pain. Neck supple. No JVD present. No tracheal deviation, no edema and no erythema present. No thyromegaly present.  Cardiovascular: Normal rate, regular rhythm, normal heart sounds, intact distal pulses and normal pulses. Exam reveals no gallop, no distant heart sounds and no friction rub.  No murmur heard. Pulses:      Dorsalis pedis pulses are 2+ on the right side, and 2+ on the left side.  No edema  Pulmonary/Chest: Effort normal and breath sounds normal. No accessory muscle usage. No respiratory distress. She has no decreased breath sounds. She has no wheezes. She has no rhonchi. She has no rales. She exhibits no tenderness.  Abdominal: Soft. Normal appearance and bowel sounds are normal. She exhibits no distension and no ascites. There is no tenderness.  Musculoskeletal: She exhibits no edema or tenderness.       Lumbar back: She exhibits decreased range of motion and laceration.  Expected osteoarthritis, stiffness; calves soft, supple. Negative homan's sign  Neurological: She is alert and oriented to person, place, and time. She has normal strength.  Skin: Skin is warm and dry. Laceration noted. She is not diaphoretic. No cyanosis. No pallor. Nails show no clubbing.    Psychiatric: She has a normal mood and affect. Her speech is normal and behavior is normal. Judgment and thought content normal. Cognition and memory are normal.  Nursing note and vitals reviewed.   Labs reviewed: Recent Labs    12/28/16 1155 12/31/16 0548 01/01/17 0617  NA 138 135 135  K 4.4 4.8 4.5  CL 103 100* 100*  CO2 28 27 27   GLUCOSE 203* 299* 179*  BUN 16 22* 24*  CREATININE 0.97 1.15* 1.11*  CALCIUM 9.5 8.6* 8.7*   Recent Labs    12/16/16 1539  AST 19  ALT 21  BILITOT 0.7  PROT 6.9   Recent Labs    12/16/16 1539 12/28/16 1155 12/31/16 0548  WBC 10.5 6.1 11.2*  NEUTROABS 6,069  --   --   HGB 12.9 13.6 10.3*  HCT 39.4 43.5 33.8*  MCV 89.5 94.0 94.7  PLT 344 175 149*   Lab Results  Component Value Date   TSH 1.59 12/16/2016   Lab Results  Component Value Date   HGBA1C 10.4 (H) 12/30/2016   Lab Results  Component Value Date   CHOL 144 12/16/2016   HDL 67 12/16/2016   LDLCALC 48 09/02/2015   TRIG 133 12/16/2016   CHOLHDL 2.1 12/16/2016    Significant Diagnostic Results in last 30 days:  Dg Lumbar Spine 2-3 Views  Result Date: 12/30/2016 CLINICAL DATA:  Posterior fusion at L4-5 EXAM: LUMBAR SPINE - 2-3 VIEW COMPARISON:  Lumbar spine films 11/16/2016 FINDINGS: Two C-arm spot films show placement of pedicular screws at L4 on L5 with interbody fusion plug in good position for fusion of L4-5. No complicating features are seen. IMPRESSION: Hardware placed for posterior fusion of L4-5. Electronically Signed   By: Ivar Drape M.D.   On: 12/30/2016 15:27   Dg Lumbar Spine 1 View  Result Date: 12/30/2016 CLINICAL DATA:  Lumbar spine surgery. EXAM: LUMBAR SPINE - 1 VIEW COMPARISON:  MRI 11/04/2016. FINDINGS: MRI scratched it lumbar spine numbered as per prior exam. Metallic marker noted posteriorly at L4-L5 disc space level. IMPRESSION: Metallic marker noted posteriorly at the L4-L5 disc space level. Electronically Signed   By: Marcello Moores  Register   On:  12/30/2016 13:17   Dg Chest Port 1 View  Result Date: 01/01/2017 CLINICAL DATA:  Hypoxia EXAM: PORTABLE CHEST 1 VIEW COMPARISON:  07/08/2015 FINDINGS: The heart is enlarged. There are no focal consolidations or pleural effusions. No pulmonary edema. Mediastinal contour is accentuated by the AP portable position of the patient. IMPRESSION: Stable cardiomegaly.  No evidence for acute pulmonary abnormality. Electronically Signed   By: Nolon Nations M.D.   On: 01/01/2017 11:01   Dg C-arm 1-60 Min  Result Date: 12/30/2016 CLINICAL DATA:  Posterior fusion at L4-5 EXAM: DG C-ARM 61-120 MIN COMPARISON:  Lumbar spine films of 11/16/2016 FINDINGS: C-arm fluoroscopy was provided during posterior fusion at L4-5. Fluoroscopy time of 30 seconds was recorded. IMPRESSION: C-arm fluoroscopy provided. Electronically Signed   By: Ivar Drape M.D.   On: 12/30/2016 15:26    Assessment/Plan 1. Spondylolithisthesis of lumbar region 2. S/P lumbar fusion  Continue working with PT/OT  Continue exercises as taught by PT/OT  Ice pack to the back as needed for pain or swelling  Skin care per protocol  Continue daily dressing changes  Continue Tylenol 650 mg po QID scheduled for pain  Continue oxycodone 5 mg 1 tablet p.o. every 3 hours as needed pain  Continue Flexeril 5 mg po TID prn for spasms/cramps  Follow up with Neurosurgeon as instructed  Family/ staff Communication:   Total Time:  Documentation:  Face to Face:  Family/Phone:   Labs/tests ordered:    Medication list reviewed and assessed for continued appropriateness. Monthly medication orders reviewed and signed.  Vikki Ports, NP-C Geriatrics [facility] [phone]  [place], [place] [address] Cell Phone (Mon-Fri 8am-5pm):  [phone] On Call:  [phone] & follow prompts after 5pm & weekends Office Phone:  [phone] Office Fax:  [phone]

## 2017-01-24 DIAGNOSIS — I2510 Atherosclerotic heart disease of native coronary artery without angina pectoris: Secondary | ICD-10-CM | POA: Insufficient documentation

## 2017-01-24 NOTE — Progress Notes (Deleted)
Cardiology Office Note  Date:  01/24/2017   ID:  Xuan, Mateus [birth year], MRN [id number]  PCP:  Mar Daring, PA-C   No chief complaint on file.   HPI:  Andrea [name] is a 71 y.o. female  CAD L4-5 decompression, instrumentation, and fusion 12/30/2016. 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 The estimated   ejection fraction was in the range of 55% to 60%. Doppler   parameters are consistent with abnormal left ventricular   relaxation (grade 1 diastolic dysfunction).  Stress test May 12, 2016 Pharmacological myocardial perfusion imaging study with no significant ischemia Large region of severe fixed perfusion defect in the mid anterior wall extending to apical region, anteroseptal wall, apical, apical inferior region, consistent with previous MI Large region of Wall motion abnormality noted in the region above EF estimated at 56% No EKG changes concerning for ischemia at peak stress or in recovery. Baseline EKG concerning for old anterior MI At least moderate risk scan given size of perfusion defect    Since last visit, pt doing well. SOB at baseline, no CP.  No PND, orhtopnea, edema.   PMH:   has a past medical history of Bronchitis, Chickenpox, Coronary artery disease, Depression, Diabetes mellitus type 2, uncomplicated ([facility]), Diabetes mellitus without complication ([place]), Diabetic retinopathy ([place]), Dupuytren contracture, Frequent urinary tract infections, GERD (gastroesophageal reflux disease), History of hand surgery (2011), Hyperlipidemia, unspecified, Irritable bowel syndrome, MI (myocardial infarction) ([place]) (1994), Neuromuscular disorder  ([place]), Osteoporosis, Pneumonia (2014), Stroke ([place]), TIA (transient ischemic attack), Vitamin D deficiency, unspecified, and Wheezing.  PSH:    Past Surgical History:  Procedure Laterality Date  . CARDIAC CATHETERIZATION  1994  . COLONOSCOPY     x3  . CORONARY ANGIOPLASTY  1994  . EYE SURGERY     cataracts  . HAND SURGERY Left 2011   left hand release of contractures  . HIP FRACTURE SURGERY  11/2013  . POSTERIOR LUMBAR INTERBODY FUSION, POSTERIOR INSTRUMENTATION LUMBAR 4- LUMBAR 5 N/A 12/30/2016   Performed by Newman Pies, MD at [facility]  . PR COLSC FLX W/REMOVAL LESION BY HOT BX FORCEPS   08/21/2015   Procedure: COLONOSCOPY, FLEXIBLE, PROXIMAL TO SPLENIC FLEXURE; W/REMOVAL TUMOR/POLYP/OTHER LESION, HOT BX FORCEP/CAUTE; Surgeon: Carlena Hurl, MD; Location: [facility]; Service: General Surgery    Current Outpatient Medications  Medication Sig Dispense Refill  . acetaminophen (TYLENOL) 325 MG tablet Take 650 mg 4 (four) times daily by mouth.    [name] Kitchen albuterol (PROVENTIL HFA;VENTOLIN HFA) 108 (90 Base) MCG/ACT inhaler Inhale 2 puffs into the lungs every 6 (six) hours as needed for wheezing or shortness of breath. 1 Inhaler 2  . Alcohol Swabs (ALCOHOL PREP) PADS Use twice daily prior to SQ injection of insulin to clean skin 100 each 6  . aspirin EC 81 MG tablet Take 81 mg daily by mouth.    [name] Kitchen atorvastatin (LIPITOR) 80 MG tablet Take 80 mg daily by mouth.    . Cyanocobalamin (B-12) 1000 MCG CAPS Take 1,000 mcg by mouth daily.     . cyclobenzaprine (FLEXERIL) 5 MG tablet Take 1 tablet (5 mg total) by mouth 3 (three) times daily as needed for muscle spasms. 50 tablet 1  .  docusate sodium (COLACE) 100 MG capsule Take 1 capsule (100 mg total) by mouth 2 (two) times daily. 60 capsule 0  . Dulaglutide (TRULICITY) 2.37 SE/8.3TD SOPN Inject 0.75 mg once a week into the skin. On Monday    . ergocalciferol (VITAMIN D2) 50000 units capsule Take 50,000 Units once a week by mouth. On Fridays     . gabapentin (NEURONTIN) 600 MG tablet Take 1 tablet (600 mg total) by mouth 3 (three) times daily. 90 tablet 5  . insulin glargine (LANTUS) 100 UNIT/ML injection Inject 0.65 mLs (65 Units total) into the skin 2 (two) times daily with a meal. 10 mL 11  . Insulin Syringe-Needle U-100 29G 1 ML MISC Use to inject 65 units Lantus SQ BIDWC 100 each 6  . losartan (COZAAR) 25 MG tablet Take 25 mg daily by mouth.    . metoprolol succinate (TOPROL-XL) 25 MG 24 hr tablet Take 0.5 tablets (12.5 mg total) by mouth daily. 45 tablet 3  . Multiple Vitamins-Minerals (ICAPS AREDS 2 PO) Take 1 capsule by mouth 2 (two) times daily.     . nitrofurantoin (MACRODANTIN) 100 MG capsule Take 100 mg at bedtime by mouth.    . Omega-3 Fatty Acids (FISH OIL) 1200 MG CAPS Take 1,200 mg by mouth daily.     [name] Kitchen omeprazole (PRILOSEC) 40 MG capsule Take 1 capsule (40 mg total) by mouth daily. 90 capsule 3  . ONETOUCH DELICA LANCETS 17O MISC To check blood sugar daily 100 each 5  . ONETOUCH VERIO test strip To check blood sugar once daily 100 each 3  . oxybutynin (DITROPAN-XL) 10 MG 24 hr tablet Take 10 mg at bedtime by mouth.    . oxyCODONE (OXY IR/ROXICODONE) 5 MG immediate release tablet Take 1 tablet (5 mg total) by mouth every 3 (three) hours as needed. 120 tablet 0  . senna (SENOKOT) 8.6 MG TABS tablet Take 2 tablets 2 (two) times daily by mouth.    . sertraline (ZOLOFT) 25 MG tablet Take 25 mg at bedtime by mouth.    . terconazole (TERAZOL 3) 0.8 % vaginal cream Place 1 applicator at bedtime vaginally. Give 3 days and hold 4 days , for vaginal yeast infection ( per home regimen )     No current facility-administered medications for this visit.      Allergies:   Metformin and related and Sulfa antibiotics   Social History:  The patient  reports that she quit smoking about 4 years ago. Her smoking use included cigarettes. She has a 30.00 pack-year smoking history. she has never used smokeless tobacco. She reports that  she drinks alcohol. She reports that she does not use drugs.   Family History:   family history includes AAA (abdominal aortic aneurysm) in her daughter; Anxiety disorder in her daughter; Arthritis in her daughter; Asthma in her brother; Cataracts in her mother; Depression in her daughter and mother; Diabetes in her daughter; Heart disease in her mother; Hyperlipidemia in her daughter and daughter; Hypertension in her daughter and mother; Plantar fasciitis in her daughter; Stroke in her mother.    Review of Systems: ROS   PHYSICAL EXAM: VS:  There were no vitals taken for this visit. , BMI There is no height or weight on file to calculate BMI. GEN: Well nourished, well developed, in no acute distress HEENT: normal Neck: no JVD, carotid bruits, or masses Cardiac: RRR; no murmurs, rubs, or gallops,no edema  Respiratory:  clear to auscultation bilaterally, normal work of breathing GI:  soft, nontender, nondistended, + BS MS: no deformity or atrophy Skin: warm and dry, no rash Neuro:  Strength and sensation are intact Psych: euthymic mood, full affect    Recent Labs: 12/16/2016: ALT 21; TSH 1.59 12/31/2016: Hemoglobin 10.3; Platelets 149 01/01/2017: BUN 24; Creatinine, Ser 1.11; Potassium 4.5; Sodium 135    Lipid Panel Lab Results  Component Value Date   CHOL 144 12/16/2016   HDL 67 12/16/2016   LDLCALC 48 09/02/2015   TRIG 133 12/16/2016      Wt Readings from Last 3 Encounters:  01/18/17 173 lb 4.8 oz (78.6 kg)  01/11/17 177 lb 8 oz (80.5 kg)  12/30/16 170 lb 8 oz (77.3 kg)       ASSESSMENT AND PLAN:  No diagnosis found.   Disposition:   F/U  6 months  No orders of the defined types were placed in this encounter.    Signed, Esmond Plants, M.D., Ph.D. 01/24/2017  [facility], [place]

## 2017-01-25 ENCOUNTER — Ambulatory Visit: Payer: Medicare Other | Admitting: Cardiovascular Disease

## 2017-01-26 ENCOUNTER — Encounter: Payer: Self-pay | Admitting: Cardiovascular Disease

## 2017-01-26 ENCOUNTER — Encounter: Payer: Self-pay | Admitting: Gerontology

## 2017-01-26 ENCOUNTER — Non-Acute Institutional Stay (SKILLED_NURSING_FACILITY): Payer: Medicare Other | Admitting: Gerontology

## 2017-01-26 DIAGNOSIS — M4316 Spondylolisthesis, lumbar region: Secondary | ICD-10-CM | POA: Diagnosis not present

## 2017-01-26 DIAGNOSIS — Z981 Arthrodesis status: Secondary | ICD-10-CM | POA: Diagnosis not present

## 2017-01-26 NOTE — Progress Notes (Signed)
Location:   [facility] Room Number: 209B Place of Service:  SNF [phone]) Provider:  Toni Arthurs, NP-C  Marlyn Corporal Clearnce Sorrel, PA-C  Patient Care Team: Rubye Beach as PCP - General (Family Medicine) Dingeldein, Remo Lipps, MD as Consulting Physician (Ophthalmology) Newman Pies, MD as Consulting Physician (Neurosurgery)  Extended Emergency Contact Information Primary Emergency Contact: Ephriam Jenkins Address: [address] and [address] [place], [place] [address] [place] Phone: [phone] Relation: Spouse Secondary Emergency Contact: Darlina Sicilian Address: [address]          [place], [place] [address] [place] Phone: [phone] Mobile Phone: [phone] Relation: Daughter  Code Status:  FULL Goals of care: Advanced Directive information Advanced Directives 01/26/2017  Does Patient Have a Medical Advance Directive? No  Would patient like information on creating a medical advance directive? No - Patient declined     Chief Complaint  Patient presents with  . Medical Management of Chronic Issues    Routine Visit    HPI:  Pt is a 71 y.o. female seen today for medical management of chronic diseases. Pt was admitted to the facility for rehab following hospitalization for lumbar fusion due to spondylolisthesis of the lumbar region. Patient has been participating in PT and OT. She is ambulatory with a walker. Patient has been progressing well. Patient reports her pain is well controlled with current regimen. On a previous visit, patient had a small hematoma under the incision site. This seems to have resolved. Incision is now well approximated, dressing CDI. No noted deficits. Back brace in place. Appetite is good, voiding well and is having regular BMs. VSS. No other complaints.    Past Medical History:  Diagnosis Date  . Bronchitis   . Chickenpox   . Coronary artery disease   . Depression     . Diabetes mellitus type 2, uncomplicated ([place])   . Diabetes mellitus without complication ([place])   . Diabetic retinopathy ([place])   . Dupuytren contracture   . Frequent urinary tract infections   . GERD (gastroesophageal reflux disease)   . History of hand surgery 2011   left hand  . Hyperlipidemia, unspecified   . Irritable bowel syndrome   . MI (myocardial infarction) ([place]) 1994  . Neuromuscular disorder ([place])    nerve pain in back and legs  . Osteoporosis   . Pneumonia 2014  . Stroke ([place])   . TIA (transient ischemic attack)   . Vitamin D deficiency, unspecified   . Wheezing    Past Surgical History:  Procedure Laterality Date  . CARDIAC CATHETERIZATION  1994  . COLONOSCOPY     x3  . CORONARY ANGIOPLASTY  1994  . EYE SURGERY     cataracts  . HAND SURGERY Left 2011   left hand release of contractures  . HIP FRACTURE SURGERY  11/2013  . PR COLSC FLX W/REMOVAL LESION BY HOT BX FORCEPS   08/21/2015   Procedure: COLONOSCOPY, FLEXIBLE, PROXIMAL TO SPLENIC FLEXURE; W/REMOVAL TUMOR/POLYP/OTHER LESION, HOT BX FORCEP/CAUTE; Surgeon: Carlena Hurl, MD; Location: [facility]; Service: General Surgery    Allergies  Allergen Reactions  . Metformin And Related Diarrhea  . Sulfa Antibiotics Rash    Allergies as of 01/26/2017      Reactions   Metformin And Related Diarrhea   Sulfa Antibiotics Rash      Medication List  Accurate as of 01/26/17 11:02 AM. Always use your most recent med list.          acetaminophen 325 MG tablet Commonly known as:  TYLENOL Take 650 mg 4 (four) times daily by mouth.   albuterol 108 (90 Base) MCG/ACT inhaler Commonly known as:  PROVENTIL HFA;VENTOLIN HFA Inhale 2 puffs into the lungs every 6 (six) hours as needed for wheezing or shortness of breath.   Alcohol Prep Pads Use twice daily prior to SQ injection of insulin to clean skin   aspirin EC 81 MG tablet Take 81 mg daily by mouth.   atorvastatin 80 MG  tablet Commonly known as:  LIPITOR Take 80 mg daily by mouth.   B-12 1000 MCG Caps Take 1,000 mcg by mouth daily.   cyclobenzaprine 5 MG tablet Commonly known as:  FLEXERIL Take 1 tablet (5 mg total) by mouth 3 (three) times daily as needed for muscle spasms.   docusate sodium 100 MG capsule Commonly known as:  COLACE Take 1 capsule (100 mg total) by mouth 2 (two) times daily.   ergocalciferol 50000 units capsule Commonly known as:  VITAMIN D2 Take 50,000 Units once a week by mouth. On Fridays   Fish Oil 1200 MG Caps Take 1,200 mg by mouth daily.   gabapentin 600 MG tablet Commonly known as:  NEURONTIN Take 1 tablet (600 mg total) by mouth 3 (three) times daily.   ICAPS AREDS 2 PO Take 1 capsule by mouth 2 (two) times daily.   insulin glargine 100 UNIT/ML injection Commonly known as:  LANTUS Inject 0.65 mLs (65 Units total) into the skin 2 (two) times daily with a meal.   Insulin Syringe-Needle U-100 29G 1 ML Misc Use to inject 65 units Lantus SQ BIDWC   losartan 25 MG tablet Commonly known as:  COZAAR Take 25 mg daily by mouth.   metoprolol succinate 25 MG 24 hr tablet Commonly known as:  TOPROL-XL Take 0.5 tablets (12.5 mg total) by mouth daily.   nitrofurantoin 100 MG capsule Commonly known as:  MACRODANTIN Take 100 mg at bedtime by mouth.   omeprazole 40 MG capsule Commonly known as:  PRILOSEC Take 1 capsule (40 mg total) by mouth daily.   ONETOUCH DELICA LANCETS 95M Misc To check blood sugar daily   ONETOUCH VERIO test strip Generic drug:  glucose blood To check blood sugar once daily   oxybutynin 10 MG 24 hr tablet Commonly known as:  DITROPAN-XL Take 10 mg at bedtime by mouth.   oxyCODONE 5 MG immediate release tablet Commonly known as:  Oxy IR/ROXICODONE Take 1 tablet (5 mg total) by mouth every 3 (three) hours as needed.   senna 8.6 MG Tabs tablet Commonly known as:  SENOKOT Take 2 tablets 2 (two) times daily by mouth.   sertraline 25 MG  tablet Commonly known as:  ZOLOFT Take 25 mg at bedtime by mouth.   terconazole 0.8 % vaginal cream Commonly known as:  TERAZOL 3 Place 1 applicator at bedtime vaginally. Give 3 days and hold 4 days , for vaginal yeast infection ( per home regimen )   TRULICITY 8.41 LK/4.4WN Sopn Generic drug:  Dulaglutide Inject 0.75 mg once a week into the skin. On Monday       Review of Systems  Constitutional: Negative for activity change, appetite change, chills, diaphoresis and fever.  HENT: Negative for congestion, sneezing, sore throat, trouble swallowing and voice change.   Respiratory: Negative for apnea, cough, choking, chest tightness, shortness  of breath and wheezing.   Cardiovascular: Negative for chest pain, palpitations and leg swelling.  Gastrointestinal: Negative for abdominal distention, abdominal pain, constipation, diarrhea and nausea.  Genitourinary: Negative for difficulty urinating, dysuria, frequency and urgency.  Musculoskeletal: Positive for arthralgias (typical arthritis) and back pain. Negative for gait problem and myalgias.  Skin: Positive for wound. Negative for color change, pallor and rash.  Neurological: Negative for dizziness, tremors, syncope, speech difficulty, weakness, numbness and headaches.  Psychiatric/Behavioral: Negative for agitation and behavioral problems.  All other systems reviewed and are negative.   Immunization History  Administered Date(s) Administered  . Influenza, High Dose Seasonal PF 02/12/2016, 12/16/2016   Pertinent  Health Maintenance Due  Topic Date Due  . DEXA SCAN  11/26/2016  . HEMOGLOBIN A1C  04/01/2017  . OPHTHALMOLOGY EXAM  07/19/2017  . MAMMOGRAM  11/26/2017  . FOOT EXAM  12/16/2017  . COLONOSCOPY  08/21/2018  . INFLUENZA VACCINE  Completed  . PNA vac Low Risk Adult  Completed   Fall Risk  12/16/2016 11/11/2015  Falls in the past year? No No   Functional Status Survey:    Vitals:   01/26/17 1040  BP: 132/73  Pulse:  93  Resp: 20  Temp: 97.9 F (36.6 C)  TempSrc: Oral  SpO2: 96%  Weight: 175 lb (79.4 kg)  Height: 5' (1.524 m)   Body mass index is 34.18 kg/m. Physical Exam  Constitutional: She is oriented to person, place, and time. Vital signs are normal. She appears well-developed and well-nourished. She is active and cooperative. She does not appear ill. No distress.  HENT:  Head: Normocephalic and atraumatic.  Mouth/Throat: Uvula is midline, oropharynx is clear and moist and mucous membranes are normal. Mucous membranes are not pale, not dry and not cyanotic.  Eyes: Conjunctivae, EOM and lids are normal. Pupils are equal, round, and reactive to light.  Neck: Trachea normal, normal range of motion and full passive range of motion without pain. Neck supple. No JVD present. No tracheal deviation, no edema and no erythema present. No thyromegaly present.  Cardiovascular: Normal rate, regular rhythm, normal heart sounds, intact distal pulses and normal pulses. Exam reveals no gallop, no distant heart sounds and no friction rub.  No murmur heard. Pulses:      Dorsalis pedis pulses are 2+ on the right side, and 2+ on the left side.  No edema  Pulmonary/Chest: Effort normal and breath sounds normal. No accessory muscle usage. No respiratory distress. She has no decreased breath sounds. She has no wheezes. She has no rhonchi. She has no rales. She exhibits no tenderness.  Abdominal: Normal appearance and bowel sounds are normal. She exhibits no distension and no ascites. There is no tenderness.  Musculoskeletal: She exhibits no edema or tenderness.       Lumbar back: She exhibits decreased range of motion and laceration.  Expected osteoarthritis, stiffness; calves soft, supple. Negative Homan's sign  Neurological: She is alert and oriented to person, place, and time. She has normal strength.  Skin: Skin is warm and dry. Laceration noted. She is not diaphoretic. No cyanosis. No pallor. Nails show no  clubbing.  Psychiatric: She has a normal mood and affect. Her speech is normal and behavior is normal. Judgment and thought content normal. Cognition and memory are normal.  Nursing note and vitals reviewed.   Labs reviewed: Recent Labs    12/28/16 1155 12/31/16 0548 01/01/17 0617  NA 138 135 135  K 4.4 4.8 4.5  CL 103 100*  100*  CO2 28 27 27   GLUCOSE 203* 299* 179*  BUN 16 22* 24*  CREATININE 0.97 1.15* 1.11*  CALCIUM 9.5 8.6* 8.7*   Recent Labs    12/16/16 1539  AST 19  ALT 21  BILITOT 0.7  PROT 6.9   Recent Labs    12/16/16 1539 12/28/16 1155 12/31/16 0548  WBC 10.5 6.1 11.2*  NEUTROABS 6,069  --   --   HGB 12.9 13.6 10.3*  HCT 39.4 43.5 33.8*  MCV 89.5 94.0 94.7  PLT 344 175 149*   Lab Results  Component Value Date   TSH 1.59 12/16/2016   Lab Results  Component Value Date   HGBA1C 10.4 (H) 12/30/2016   Lab Results  Component Value Date   CHOL 144 12/16/2016   HDL 67 12/16/2016   LDLCALC 48 09/02/2015   TRIG 133 12/16/2016   CHOLHDL 2.1 12/16/2016    Significant Diagnostic Results in last 30 days:  Dg Lumbar Spine 2-3 Views  Result Date: 12/30/2016 CLINICAL DATA:  Posterior fusion at L4-5 EXAM: LUMBAR SPINE - 2-3 VIEW COMPARISON:  Lumbar spine films 11/16/2016 FINDINGS: Two C-arm spot films show placement of pedicular screws at L4 on L5 with interbody fusion plug in good position for fusion of L4-5. No complicating features are seen. IMPRESSION: Hardware placed for posterior fusion of L4-5. Electronically Signed   By: Ivar Drape M.D.   On: 12/30/2016 15:27   Dg Lumbar Spine 1 View  Result Date: 12/30/2016 CLINICAL DATA:  Lumbar spine surgery. EXAM: LUMBAR SPINE - 1 VIEW COMPARISON:  MRI 11/04/2016. FINDINGS: MRI scratched it lumbar spine numbered as per prior exam. Metallic marker noted posteriorly at L4-L5 disc space level. IMPRESSION: Metallic marker noted posteriorly at the L4-L5 disc space level. Electronically Signed   By: Marcello Moores  Register    On: 12/30/2016 13:17   Dg Chest Port 1 View  Result Date: 01/01/2017 CLINICAL DATA:  Hypoxia EXAM: PORTABLE CHEST 1 VIEW COMPARISON:  07/08/2015 FINDINGS: The heart is enlarged. There are no focal consolidations or pleural effusions. No pulmonary edema. Mediastinal contour is accentuated by the AP portable position of the patient. IMPRESSION: Stable cardiomegaly.  No evidence for acute pulmonary abnormality. Electronically Signed   By: Nolon Nations M.D.   On: 01/01/2017 11:01   Dg C-arm 1-60 Min  Result Date: 12/30/2016 CLINICAL DATA:  Posterior fusion at L4-5 EXAM: DG C-ARM 61-120 MIN COMPARISON:  Lumbar spine films of 11/16/2016 FINDINGS: C-arm fluoroscopy was provided during posterior fusion at L4-5. Fluoroscopy time of 30 seconds was recorded. IMPRESSION: C-arm fluoroscopy provided. Electronically Signed   By: Ivar Drape M.D.   On: 12/30/2016 15:26    Assessment/Plan 1. Spondylolithisthesis of lumbar region 2. S/P lumbar fusion  Continue working with PT/OT  Continue exercises as taught by PT/OT  Ice pack to the back as needed for pain or swelling  Skin care per protocol  Continue daily dressing changes  Continue Tylenol 650 mg po QID scheduled for pain  Continue Oxycodone 5 mg po Q 3 hours prn pain  Continue Flexeril 5 mg po TID prn for spasms/cramps  Follow up with Neurosurgeon as instructed   Family/ staff Communication:   Total Time:  Documentation:  Face to Face:  Family/Phone:   Labs/tests ordered:    Medication list reviewed and assessed for continued appropriateness. Monthly medication orders reviewed and signed.  Vikki Ports, NP-C [facility] [phone] [address], [place] [address] Cell  Phone (Mon-Fri 8am-5pm):  [phone] On Call:  [phone] & follow prompts after 5pm & weekends Office Phone:  [phone] Office Fax:  [phone]

## 2017-02-10 ENCOUNTER — Other Ambulatory Visit: Payer: Self-pay | Admitting: Physician Assistant

## 2017-02-10 ENCOUNTER — Telehealth: Payer: Self-pay | Admitting: Physician Assistant

## 2017-02-10 ENCOUNTER — Telehealth: Payer: Self-pay

## 2017-02-10 DIAGNOSIS — Z794 Long term (current) use of insulin: Secondary | ICD-10-CM

## 2017-02-10 DIAGNOSIS — E1142 Type 2 diabetes mellitus with diabetic polyneuropathy: Secondary | ICD-10-CM

## 2017-02-10 MED ORDER — INSULIN GLARGINE 100 UNIT/ML ~~LOC~~ SOLN: 65.0000 [IU] | Freq: Two times a day (BID) | SUBCUTANEOUS | 11 refills | Status: DC

## 2017-02-10 NOTE — Telephone Encounter (Signed)
Pt called asking about her rx for [place].  She said the pharmacy [facility] told her they had called [place] 3 times and faxed an order to [place].  I did not see that in her chart.  Please advise  [phone]  Thanks teri

## 2017-02-10 NOTE — Telephone Encounter (Signed)
Patient advised as directed below.  Thanks,  -Aanchal Cope 

## 2017-02-10 NOTE — Telephone Encounter (Signed)
Chris (PT) with [facility] called to report that Andrea [name] BP reading is low 83/44. Patient reports that last night she was not feeling well was having upper back pain and vomiting. Patient denies any shortness of breath, chest pain, swelling, or vomiting today. Patient reports she is feeling much better, and has been able to drink water today with out vomiting. Gerald Stabs reports that patient is stable and reports that patient does not seem to need urgent care at this moment. Gerald Stabs advised patient to continue hydrating and check BP again and call with any changes or questions.  [phone] Andrea [name] [place]

## 2017-02-10 NOTE — Telephone Encounter (Signed)
Agree push fluids and call if BP continues to run low or if she starts with vomiting or diarrhea again

## 2017-02-10 NOTE — Telephone Encounter (Signed)
error 

## 2017-02-10 NOTE — Progress Notes (Signed)
Request received for insulin refill. This has been sent to [facility]

## 2017-02-10 NOTE — Telephone Encounter (Signed)
Patient advised as below. Patient verbalizes understanding and is in agreement with treatment plan. Patient reports she is feeling better, BP 96/76 now.

## 2017-03-09 ENCOUNTER — Other Ambulatory Visit: Payer: Self-pay | Admitting: Physician Assistant

## 2017-03-09 DIAGNOSIS — E559 Vitamin D deficiency, unspecified: Secondary | ICD-10-CM

## 2017-03-09 MED ORDER — ERGOCALCIFEROL 1.25 MG (50000 UT) PO CAPS: 50000.0000 [IU] | ORAL_CAPSULE | ORAL | 1 refills | Status: DC

## 2017-03-09 NOTE — Telephone Encounter (Signed)
[place] [facility] faxed a refill request for the following medication. Thanks CC  ergocalciferol (VITAMIN D2) 50000 units capsule

## 2017-03-22 ENCOUNTER — Encounter: Payer: Self-pay | Admitting: Physician Assistant

## 2017-03-22 ENCOUNTER — Ambulatory Visit: Payer: Medicare Other | Admitting: Physician Assistant

## 2017-03-22 VITALS — BP 130/70 | HR 68 | Temp 97.8°F | Resp 16 | Wt 169.0 lb

## 2017-03-22 DIAGNOSIS — M4316 Spondylolisthesis, lumbar region: Secondary | ICD-10-CM

## 2017-03-22 DIAGNOSIS — E7800 Pure hypercholesterolemia, unspecified: Secondary | ICD-10-CM

## 2017-03-22 DIAGNOSIS — B3731 Acute candidiasis of vulva and vagina: Secondary | ICD-10-CM

## 2017-03-22 DIAGNOSIS — B373 Candidiasis of vulva and vagina: Secondary | ICD-10-CM | POA: Diagnosis not present

## 2017-03-22 DIAGNOSIS — E113299 Type 2 diabetes mellitus with mild nonproliferative diabetic retinopathy without macular edema, unspecified eye: Secondary | ICD-10-CM

## 2017-03-22 DIAGNOSIS — Z794 Long term (current) use of insulin: Secondary | ICD-10-CM | POA: Diagnosis not present

## 2017-03-22 DIAGNOSIS — Z1231 Encounter for screening mammogram for malignant neoplasm of breast: Secondary | ICD-10-CM

## 2017-03-22 LAB — POCT GLYCOSYLATED HEMOGLOBIN (HGB A1C)
Est. average glucose Bld gHb Est-mCnc: 249
HEMOGLOBIN A1C: 10.3

## 2017-03-22 MED ORDER — HYDROCODONE-ACETAMINOPHEN 10-325 MG PO TABS: 1.0000 | ORAL_TABLET | Freq: Three times a day (TID) | ORAL | 0 refills | Status: DC | PRN

## 2017-03-22 MED ORDER — FISH OIL 1200 MG PO CAPS: 1200.0000 mg | ORAL_CAPSULE | Freq: Every day | ORAL | 3 refills | Status: DC

## 2017-03-22 MED ORDER — TERCONAZOLE 0.8 % VA CREA: 1.0000 | TOPICAL_CREAM | Freq: Every day | VAGINAL | 11 refills | Status: DC

## 2017-03-22 NOTE — Progress Notes (Signed)
Patient: Andrea [name] Female    DOB: 10-18-1945   72 y.o.   MRN: [id number] Visit Date: 03/22/2017  Today's Provider: Mar Daring, PA-C   Chief Complaint  Patient presents with  . Follow-up    T2DM   Subjective:    HPI  Diabetes Mellitus Type II, Follow-up:   Lab Results  Component Value Date   HGBA1C 10.3 03/22/2017   HGBA1C 10.4 (H) 12/30/2016   HGBA1C 11.0 (H) 12/16/2016    Last seen for diabetes 3 months ago.  Management since then includes add Trulicity to Lantus 65 units BID, She reports good compliance with treatment. She is not having side effects.  Current symptoms include none and have been stable. Home blood sugar records: 140's  Episodes of hypoglycemia? no   Current Insulin Regimen: Trulicity 3.32 RJ/1.8AC and Lantus 65 units Most Recent Eye Exam: UTD Weight trend: stable Prior visit with dietician: no Current diet: in general, a "healthy" diet   Current exercise: none  Pertinent Labs:    Component Value Date/Time   CHOL 144 12/16/2016 1539   CHOL 142 09/02/2015 1214   TRIG 133 12/16/2016 1539   HDL 67 12/16/2016 1539   HDL 67 09/02/2015 1214   LDLCALC 48 09/02/2015 1214   CREATININE 1.11 (H) 01/01/2017 0617   CREATININE 1.08 (H) 12/16/2016 1539    Wt Readings from Last 3 Encounters:  03/22/17 169 lb (76.7 kg)  01/26/17 175 lb (79.4 kg)  01/18/17 173 lb 4.8 oz (78.6 kg)    ------------------------------------------------------------------------     Allergies  Allergen Reactions  . Metformin And Related Diarrhea  . Sulfa Antibiotics Rash     Current Outpatient Medications:  .  acetaminophen (TYLENOL) 325 MG tablet, Take 650 mg 4 (four) times daily by mouth., Disp: , Rfl:  .  albuterol (PROVENTIL HFA;VENTOLIN HFA) 108 (90 Base) MCG/ACT inhaler, Inhale 2 puffs into the lungs every 6 (six) hours as needed for wheezing or shortness of breath., Disp: 1 Inhaler, Rfl: 2 .  Alcohol Swabs (ALCOHOL PREP) PADS, Use twice  daily prior to SQ injection of insulin to clean skin, Disp: 100 each, Rfl: 6 .  aspirin EC 81 MG tablet, Take 81 mg daily by mouth., Disp: , Rfl:  .  atorvastatin (LIPITOR) 80 MG tablet, Take 80 mg daily by mouth., Disp: , Rfl:  .  Cyanocobalamin (B-12) 1000 MCG CAPS, Take 1,000 mcg by mouth daily. , Disp: , Rfl:  .  cyclobenzaprine (FLEXERIL) 5 MG tablet, Take 1 tablet (5 mg total) by mouth 3 (three) times daily as needed for muscle spasms., Disp: 50 tablet, Rfl: 1 .  docusate sodium (COLACE) 100 MG capsule, Take 1 capsule (100 mg total) by mouth 2 (two) times daily., Disp: 60 capsule, Rfl: 0 .  Dulaglutide (TRULICITY) 1.66 AY/3.0ZS SOPN, Inject 0.75 mg once a week into the skin. On Monday, Disp: , Rfl:  .  ergocalciferol (VITAMIN D2) 50000 units capsule, Take 1 capsule (50,000 Units total) by mouth once a week. On Fridays, Disp: 12 capsule, Rfl: 1 .  gabapentin (NEURONTIN) 600 MG tablet, Take 1 tablet (600 mg total) by mouth 3 (three) times daily., Disp: 90 tablet, Rfl: 5 .  insulin glargine (LANTUS) 100 UNIT/ML injection, Inject 0.65 mLs (65 Units total) into the skin 2 (two) times daily with a meal., Disp: 10 mL, Rfl: 11 .  Insulin Syringe-Needle U-100 29G 1 ML MISC, Use to inject 65 units Lantus SQ BIDWC, Disp: 100  each, Rfl: 6 .  losartan (COZAAR) 25 MG tablet, Take 25 mg daily by mouth., Disp: , Rfl:  .  metoprolol succinate (TOPROL-XL) 25 MG 24 hr tablet, Take 0.5 tablets (12.5 mg total) by mouth daily., Disp: 45 tablet, Rfl: 3 .  Multiple Vitamins-Minerals (ICAPS AREDS 2 PO), Take 1 capsule by mouth 2 (two) times daily. , Disp: , Rfl:  .  nitrofurantoin (MACRODANTIN) 100 MG capsule, Take 100 mg at bedtime by mouth., Disp: , Rfl:  .  Omega-3 Fatty Acids (FISH OIL) 1200 MG CAPS, Take 1,200 mg by mouth daily. , Disp: , Rfl:  .  omeprazole (PRILOSEC) 40 MG capsule, Take 1 capsule (40 mg total) by mouth daily., Disp: 90 capsule, Rfl: 3 .  ONETOUCH DELICA LANCETS 23J MISC, To check blood sugar  daily, Disp: 100 each, Rfl: 5 .  ONETOUCH VERIO test strip, To check blood sugar once daily, Disp: 100 each, Rfl: 3 .  oxybutynin (DITROPAN-XL) 10 MG 24 hr tablet, Take 10 mg at bedtime by mouth., Disp: , Rfl:  .  senna (SENOKOT) 8.6 MG TABS tablet, Take 2 tablets 2 (two) times daily by mouth., Disp: , Rfl:  .  sertraline (ZOLOFT) 25 MG tablet, Take 25 mg at bedtime by mouth., Disp: , Rfl:  .  terconazole (TERAZOL 3) 0.8 % vaginal cream, Place 1 applicator at bedtime vaginally. Give 3 days and hold 4 days , for vaginal yeast infection ( per home regimen ), Disp: , Rfl:   Review of Systems  Constitutional: Negative.   Respiratory: Negative.   Cardiovascular: Negative.   Gastrointestinal: Negative.   Endocrine: Negative.   Genitourinary: Positive for dysuria and frequency. Negative for flank pain, hematuria, pelvic pain and urgency.  Neurological: Positive for numbness.    Social History   Tobacco Use  . Smoking status: Former Smoker    Packs/day: 1.00    Years: 30.00    Pack years: 30.00    Types: Cigarettes    Last attempt to quit: 07/17/2012    Years since quitting: 4.6  . Smokeless tobacco: Never Used  . Tobacco comment:    Substance Use Topics  . Alcohol use: Yes    Alcohol/week: 0.0 oz    Comment: Rarely   Objective:   BP 130/70 (BP Location: Left Wrist, Patient Position: Sitting, Cuff Size: Normal)   Pulse 68   Temp 97.8 F (36.6 C) (Oral)   Resp 16   Wt 169 lb (76.7 kg)   BMI 33.01 kg/m    Physical Exam  Constitutional: She appears well-developed and well-nourished. No distress.  Neck: Normal range of motion. Neck supple.  Cardiovascular: Normal rate, regular rhythm and normal heart sounds. Exam reveals no gallop and no friction rub.  No murmur heard. Pulmonary/Chest: Effort normal and breath sounds normal. No respiratory distress. She has no wheezes. She has no rales.  Skin: She is not diaphoretic.  Vitals reviewed.       Assessment & Plan:   1. 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 Patient does admit to only using Lantus once daily, not twice. She does state she is using Trulicity every Monday. Had long discussion with patient on importance of taking insulin. Discussed regimen of taking Lantus in the morning with some food then taking the second dose in the evening with her largest meal. She is  to continue Trulicity as well. I have advised her to watch for hypoglycemic episodes with this dosing as this may occur. She agrees and will call. I will see her back in 3 months to recheck.  - POCT glycosylated hemoglobin (Hb A1C)  2. Encounter for screening mammogram for breast cancer Mammogram ordered as below.  - MM DIGITAL SCREENING BILATERAL; Future  3. Vaginal yeast infection Stable. Diagnosis pulled for medication refill. Continue current medical treatment plan. - terconazole (TERAZOL 3) 0.8 % vaginal cream; Place 1 applicator vaginally at bedtime. Give 3 days and hold 4 days , for vaginal yeast infection ( per home regimen )  Dispense: 20 g; Refill: 11  4. Hypercholesterolemia Stable. Diagnosis pulled for medication refill. Continue current medical treatment plan. - Omega-3 Fatty Acids (FISH OIL) 1200 MG CAPS; Take 1 capsule (1,200 mg total) by mouth daily.  Dispense: 90 capsule; Refill: 3  5. Spondylolisthesis of lumbar region Stable. Diagnosis pulled for medication refill. Continue current medical treatment plan. - HYDROcodone-acetaminophen (NORCO) 10-325 MG tablet; Take 1 tablet by mouth every 8 (eight) hours as needed.  Dispense: 90 tablet; Refill: 0       Mar Daring, PA-C  [facility]

## 2017-03-22 NOTE — Patient Instructions (Addendum)
Take 65 units lantus in morning with small amount of food when you wake up and then take the 2nd 65 units of lantus with supper in the evening.  Continue trulicity once weekly, on Mondays.

## 2017-03-24 ENCOUNTER — Telehealth: Payer: Self-pay | Admitting: Physician Assistant

## 2017-03-24 NOTE — Telephone Encounter (Signed)
LMTCB  Thanks,  -Latacha Texeira 

## 2017-03-24 NOTE — Telephone Encounter (Signed)
I dont have this in her medication list so I do not know dosing. She can have [facility] send a request.

## 2017-03-24 NOTE — Telephone Encounter (Signed)
Patient is calling asking if you will send in a prescription for lidocain prilocaine cream.  She would like this sent to [facility]

## 2017-03-25 ENCOUNTER — Telehealth: Payer: Self-pay

## 2017-03-25 DIAGNOSIS — M5442 Lumbago with sciatica, left side: Principal | ICD-10-CM

## 2017-03-25 DIAGNOSIS — M5441 Lumbago with sciatica, right side: Principal | ICD-10-CM

## 2017-03-25 DIAGNOSIS — G8929 Other chronic pain: Secondary | ICD-10-CM

## 2017-03-25 NOTE — Telephone Encounter (Signed)
LMTCB

## 2017-03-25 NOTE — Telephone Encounter (Signed)
Pt has never has the lidocaine prilocaine prescribed before, and an [employer] employee had advised her to try this medication. This is for back and leg pain. [place].

## 2017-03-26 MED ORDER — LIDOCAINE-PRILOCAINE 2.5-2.5 % EX CREA: 1.0000 "application " | TOPICAL_CREAM | CUTANEOUS | 0 refills | Status: DC | PRN

## 2017-03-26 NOTE — Telephone Encounter (Signed)
Med sent in to try

## 2017-04-09 NOTE — Progress Notes (Signed)
Cardiology Office Note  Date:  04/12/2017   ID:  [name], [name] 10/09/1945, MRN [id number]  PCP:  Mar Daring, PA-C   Chief Complaint  Patient presents with  . Other    OD 6 month follow up. Patient states she is "doing well" Patient last seen 05/27/2016. Former Water engineer patient. Meds reviewed verbally with patient.     HPI:  Andrea [name] is a 72 y.o. female  with past medical history of obesity,  Diabetes type II, hemoglobin A1c 10.3  Prior stroke/TIA Prior smoking, stopped 4 to 5 yrs ago hyperlipidemia,  right bundle branch block,  myocardial infarction (in 1994-95) , mid anterior wall to apical region, anteroseptal, apical and apical inferior region systolic heart failure with ejection fraction of 45-50% in May 2014 Normal ejection fraction March 2018 Who presents for follow-up of her coronary artery disease, history of previous MI  No chest pain,  Walks with a walker, some gait instability, previous falls but none recently  Previous fall leading to hip fracture No significant lower extremity edema  Previous lab work reviewed with her Total cholesterol 144, LDL 56  Previous testing reviewed with her in detail Echocardiogram March 2018 ejection fraction 55-60%  Stress test March 2018 fixed defect mid to distal anterior wall, previous MI  HBa1C 10.3 Struggling to get her sugars down, sometimes eating the wrong foods Try carbohydrates 20 pound weight loss it would appear over the past several months  Stopped smoking 4 years ago after PNA  EKG personally reviewed by myself on todays visit Shows normal sinus rhythm with rate 99 bpm consider old anterior MI, left axis deviation  PMH:   has a past medical history of Bronchitis, Chickenpox, Coronary artery disease, Depression, Diabetes mellitus type 2, uncomplicated ([place]), Diabetes mellitus without complication ([place]), Diabetic retinopathy ([place]), Dupuytren contracture, Frequent urinary tract infections, GERD  (gastroesophageal reflux disease), History of hand surgery (2011), Hyperlipidemia, unspecified, Irritable bowel syndrome, MI (myocardial infarction) ([place]) (1994), Neuromuscular disorder ([place]), Osteoporosis, Pneumonia (2014), Stroke ([place]), TIA (transient ischemic attack), Vitamin D deficiency, unspecified, and Wheezing.  PSH:    Past Surgical History:  Procedure Laterality Date  . CARDIAC CATHETERIZATION  1994  . COLONOSCOPY     x3  . CORONARY ANGIOPLASTY  1994  . EYE SURGERY     cataracts  . HAND SURGERY Left 2011   left hand release of contractures  . HIP FRACTURE SURGERY  11/2013  . PR COLSC FLX W/REMOVAL LESION BY HOT BX FORCEPS   08/21/2015   Procedure: COLONOSCOPY, FLEXIBLE, PROXIMAL TO SPLENIC FLEXURE; W/REMOVAL TUMOR/POLYP/OTHER LESION, HOT BX FORCEP/CAUTE; Surgeon: Carlena Hurl, MD; Location: OR [facility]; Service: General Surgery    Current Outpatient Medications  Medication Sig Dispense Refill  . acetaminophen (TYLENOL) 325 MG tablet Take 650 mg 4 (four) times daily by mouth.    [name] Kitchen albuterol (PROVENTIL HFA;VENTOLIN HFA) 108 (90 Base) MCG/ACT inhaler Inhale 2 puffs into the lungs every 6 (six) hours as needed for wheezing or shortness of breath. 1 Inhaler 2  . Alcohol Swabs (ALCOHOL PREP) PADS Use twice daily prior to SQ injection of insulin to clean skin 100 each 6  . aspirin EC 81 MG tablet Take 81 mg daily by mouth.    [name] Kitchen atorvastatin (LIPITOR) 80 MG tablet Take 80 mg daily by mouth.    . Cyanocobalamin (B-12) 1000 MCG CAPS Take 1,000 mcg by mouth daily.     . cyclobenzaprine (FLEXERIL) 5 MG tablet Take 1 tablet (5 mg total)  by mouth 3 (three) times daily as needed for muscle spasms. 50 tablet 1  . docusate sodium (COLACE) 100 MG capsule Take 1 capsule (100 mg total) by mouth 2 (two) times daily. 60 capsule 0  . Dulaglutide (TRULICITY) 1.61 WR/6.0AV SOPN Inject 0.75 mg once a week into the skin. On Monday    . ergocalciferol (VITAMIN D2) 50000 units capsule Take 1  capsule (50,000 Units total) by mouth once a week. On Fridays 12 capsule 1  . gabapentin (NEURONTIN) 600 MG tablet Take 1 tablet (600 mg total) by mouth 3 (three) times daily. 90 tablet 5  . HYDROcodone-acetaminophen (NORCO) 10-325 MG tablet Take 1 tablet by mouth every 8 (eight) hours as needed. 90 tablet 0  . insulin glargine (LANTUS) 100 UNIT/ML injection Inject 0.65 mLs (65 Units total) into the skin 2 (two) times daily with a meal. 10 mL 11  . Insulin Syringe-Needle U-100 29G 1 ML MISC Use to inject 65 units Lantus SQ BIDWC 100 each 6  . lidocaine-prilocaine (EMLA) cream Apply 1 application topically as needed. 30 g 0  . losartan (COZAAR) 25 MG tablet Take 25 mg daily by mouth.    . metoprolol succinate (TOPROL-XL) 25 MG 24 hr tablet Take 0.5 tablets (12.5 mg total) by mouth daily. 45 tablet 3  . Multiple Vitamins-Minerals (ICAPS AREDS 2 PO) Take 1 capsule by mouth 2 (two) times daily.     . nitrofurantoin (MACRODANTIN) 100 MG capsule Take 100 mg at bedtime by mouth.    . Omega-3 Fatty Acids (FISH OIL) 1200 MG CAPS Take 1 capsule (1,200 mg total) by mouth daily. 90 capsule 3  . omeprazole (PRILOSEC) 40 MG capsule Take 1 capsule (40 mg total) by mouth daily. 90 capsule 3  . ONETOUCH DELICA LANCETS 40J MISC To check blood sugar daily 100 each 5  . ONETOUCH VERIO test strip To check blood sugar once daily 100 each 3  . oxybutynin (DITROPAN-XL) 10 MG 24 hr tablet Take 10 mg at bedtime by mouth.    . senna (SENOKOT) 8.6 MG TABS tablet Take 2 tablets 2 (two) times daily by mouth.    . sertraline (ZOLOFT) 25 MG tablet Take 25 mg at bedtime by mouth.    . terconazole (TERAZOL 3) 0.8 % vaginal cream Place 1 applicator vaginally at bedtime. Give 3 days and hold 4 days , for vaginal yeast infection ( per home regimen ) 20 g 11   No current facility-administered medications for this visit.      Allergies:   Metformin and related and Sulfa antibiotics   Social History:  The patient  reports that she  quit smoking about 4 years ago. Her smoking use included cigarettes. She has a 30.00 pack-year smoking history. she has never used smokeless tobacco. She reports that she drinks alcohol. She reports that she does not use drugs.   Family History:   family history includes AAA (abdominal aortic aneurysm) in her daughter; Anxiety disorder in her daughter; Arthritis in her daughter; Asthma in her brother; Cataracts in her mother; Depression in her daughter and mother; Diabetes in her daughter; Heart disease in her mother; Hyperlipidemia in her daughter and daughter; Hypertension in her daughter and mother; Plantar fasciitis in her daughter; Stroke in her mother.    Review of Systems: Review of Systems  Constitutional: Negative.   Respiratory: Negative.   Cardiovascular: Negative.   Gastrointestinal: Negative.   Musculoskeletal: Negative.        Unsteady gait Previous falls  Neurological: Negative.   Psychiatric/Behavioral: Negative.   All other systems reviewed and are negative.    PHYSICAL EXAM: VS:  BP 133/70 (BP Location: Right Arm, Patient Position: Sitting, Cuff Size: Normal)   Pulse 99   Ht 5' (1.524 m)   Wt 153 lb (69.4 kg)   BMI 29.88 kg/m  , BMI Body mass index is 29.88 kg/m. GEN: Well nourished, well developed, in no acute distress , obese, walking with a walker HEENT: normal  Neck: no JVD, carotid bruits, or masses Cardiac: RRR; no murmurs, rubs, or gallops,no edema  Respiratory:  clear to auscultation bilaterally, normal work of breathing GI: soft, nontender, nondistended, + BS MS: no deformity or atrophy  Skin: warm and dry, no rash Neuro:  Strength and sensation are intact Psych: euthymic mood, full affect    Recent Labs: 12/16/2016: ALT 21; TSH 1.59 12/31/2016: Hemoglobin 10.3; Platelets 149 01/01/2017: BUN 24; Creatinine, Ser 1.11; Potassium 4.5; Sodium 135    Lipid Panel Lab Results  Component Value Date   CHOL 144 12/16/2016   HDL 67 12/16/2016    LDLCALC 48 09/02/2015   TRIG 133 12/16/2016      Wt Readings from Last 3 Encounters:  04/12/17 153 lb (69.4 kg)  03/22/17 169 lb (76.7 kg)  01/26/17 175 lb (79.4 kg)       ASSESSMENT AND PLAN:  Atherosclerosis of native coronary artery with stable angina pectoris, unspecified whether native or transplanted heart ([place]) - Plan: EKG 12-Lead Currently with no symptoms of angina. No further workup at this time. Continue current medication regimen.  Stable  Poorly controlled type 2 diabetes mellitus ([place]) - Plan: EKG 12-Lead Long discussion concerning her diet Dietary guide provided Suggestions made when they eat out  Mixed hyperlipidemia - Plan: EKG 12-Lead Cholesterol is at goal on the current lipid regimen. No changes to the medications were made.  Former smoker - Plan: EKG 12-Lead Stopped several years ago after pneumonia  Disposition:   F/U  12 months   Total encounter time more than 25 minutes  Greater than 50% was spent in counseling and coordination of care with the patient    Orders Placed This Encounter  Procedures  . EKG 12-Lead     Signed, Esmond Plants, M.D., Ph.D. 04/12/2017  [place], [place]

## 2017-04-12 ENCOUNTER — Ambulatory Visit
Admission: RE | Admit: 2017-04-12 | Discharge: 2017-04-12 | Disposition: A | Discharge status: Home / Self Care | Payer: Medicare Other | Source: Ambulatory Visit | Attending: Physician Assistant | Admitting: Physician Assistant

## 2017-04-12 ENCOUNTER — Ambulatory Visit (INDEPENDENT_AMBULATORY_CARE_PROVIDER_SITE_OTHER): Payer: Medicare Other | Admitting: Cardiovascular Disease

## 2017-04-12 ENCOUNTER — Encounter: Payer: Self-pay | Admitting: Cardiovascular Disease

## 2017-04-12 VITALS — BP 133/70 | HR 99 | Ht 60.0 in | Wt 153.0 lb

## 2017-04-12 DIAGNOSIS — E782 Mixed hyperlipidemia: Secondary | ICD-10-CM | POA: Diagnosis not present

## 2017-04-12 DIAGNOSIS — I25118 Atherosclerotic heart disease of native coronary artery with other forms of angina pectoris: Secondary | ICD-10-CM | POA: Diagnosis not present

## 2017-04-12 DIAGNOSIS — Z87891 Personal history of nicotine dependence: Secondary | ICD-10-CM | POA: Diagnosis not present

## 2017-04-12 DIAGNOSIS — E1165 Type 2 diabetes mellitus with hyperglycemia: Secondary | ICD-10-CM | POA: Diagnosis not present

## 2017-04-12 DIAGNOSIS — Z1231 Encounter for screening mammogram for malignant neoplasm of breast: Secondary | ICD-10-CM | POA: Insufficient documentation

## 2017-04-12 MED ORDER — ATORVASTATIN CALCIUM 80 MG PO TABS: 80.0000 mg | ORAL_TABLET | Freq: Every day | ORAL | 3 refills | Status: DC

## 2017-04-12 NOTE — Patient Instructions (Signed)

## 2017-04-13 ENCOUNTER — Telehealth: Payer: Self-pay

## 2017-04-13 NOTE — Telephone Encounter (Signed)
Patient advised as directed below.  Thanks,  -Efstathios Sawin 

## 2017-04-13 NOTE — Telephone Encounter (Signed)
-----   Message from Mar Daring, [place] sent at 04/12/2017  8:15 PM EST ----- Normal mammogram. Repeat screening in one year.

## 2017-04-20 ENCOUNTER — Other Ambulatory Visit: Payer: Self-pay | Admitting: Physician Assistant

## 2017-04-20 ENCOUNTER — Encounter: Payer: Self-pay | Admitting: Physician Assistant

## 2017-04-20 DIAGNOSIS — M4316 Spondylolisthesis, lumbar region: Secondary | ICD-10-CM

## 2017-04-20 MED ORDER — HYDROCODONE-ACETAMINOPHEN 10-325 MG PO TABS: 1.0000 | ORAL_TABLET | Freq: Three times a day (TID) | ORAL | 0 refills | Status: DC | PRN

## 2017-04-20 NOTE — Telephone Encounter (Signed)
NCCSR reviewed. Rx printed. 

## 2017-04-20 NOTE — Telephone Encounter (Signed)
Patient is requesting a refill on the following medication  HYDROcodone-acetaminophen (NORCO) 10-325 MG tablet   She uses [facility].  She would like to pick this up Friday along with her jury duty excuse if possible.

## 2017-05-16 ENCOUNTER — Other Ambulatory Visit: Payer: Self-pay | Admitting: Physician Assistant

## 2017-05-16 DIAGNOSIS — M4316 Spondylolisthesis, lumbar region: Secondary | ICD-10-CM

## 2017-05-16 MED ORDER — HYDROCODONE-ACETAMINOPHEN 10-325 MG PO TABS: 1.0000 | ORAL_TABLET | Freq: Three times a day (TID) | ORAL | 0 refills | Status: DC | PRN

## 2017-05-16 NOTE — Telephone Encounter (Signed)
Pt contacted office for refill request on the following medications:  1. nitrofurantoin (MACRODANTIN) 100 MG capsule Last Rx: 12/16/16 (pt wasn't sure if Tawanna Sat wanted her to continue taking this medication)  2. HYDROcodone-acetaminophen (NORCO) 10-325 MG tablet Last Rx: 04/20/17  LOV: 03/22/17  [place]  Please advise. Thanks TNP

## 2017-05-16 NOTE — Telephone Encounter (Signed)
NCCSR reviewed. 

## 2017-06-20 ENCOUNTER — Other Ambulatory Visit: Payer: Self-pay | Admitting: Physician Assistant

## 2017-06-20 DIAGNOSIS — E1140 Type 2 diabetes mellitus with diabetic neuropathy, unspecified: Secondary | ICD-10-CM

## 2017-06-20 DIAGNOSIS — M4316 Spondylolisthesis, lumbar region: Secondary | ICD-10-CM

## 2017-06-20 MED ORDER — HYDROCODONE-ACETAMINOPHEN 10-325 MG PO TABS: 1.0000 | ORAL_TABLET | Freq: Three times a day (TID) | ORAL | 0 refills | Status: DC | PRN

## 2017-06-20 MED ORDER — GABAPENTIN 600 MG PO TABS: 600.0000 mg | ORAL_TABLET | Freq: Three times a day (TID) | ORAL | 5 refills | Status: DC

## 2017-06-20 NOTE — Telephone Encounter (Signed)
Please review. Thanks!  

## 2017-06-20 NOTE — Telephone Encounter (Signed)
[place] reviewed. No red flags. Narcotics only written by me on file.

## 2017-06-20 NOTE — Telephone Encounter (Signed)
Pt needs refill on her  Hydrocodone 10-325  She uses [facility]  Pt's call back is [phone]  Thanks teri

## 2017-06-20 NOTE — Telephone Encounter (Signed)
[facility] faxed a refill request for the following medication. Thanks CC   gabapentin (NEURONTIN) 600 MG tablet

## 2017-06-21 ENCOUNTER — Telehealth: Payer: Self-pay

## 2017-06-21 ENCOUNTER — Ambulatory Visit: Payer: Medicare Other | Admitting: Physician Assistant

## 2017-06-21 ENCOUNTER — Encounter: Payer: Self-pay | Admitting: Physician Assistant

## 2017-06-21 VITALS — BP 122/68 | HR 95 | Temp 97.9°F | Resp 16 | Ht 60.0 in | Wt 168.0 lb

## 2017-06-21 DIAGNOSIS — E113299 Type 2 diabetes mellitus with mild nonproliferative diabetic retinopathy without macular edema, unspecified eye: Secondary | ICD-10-CM | POA: Diagnosis not present

## 2017-06-21 DIAGNOSIS — Z794 Long term (current) use of insulin: Secondary | ICD-10-CM | POA: Diagnosis not present

## 2017-06-21 DIAGNOSIS — F320 Major depressive disorder, single episode, mild: Secondary | ICD-10-CM

## 2017-06-21 LAB — POCT GLYCOSYLATED HEMOGLOBIN (HGB A1C)
ESTIMATED AVERAGE GLUCOSE: 263
Hemoglobin A1C: 10.8

## 2017-06-21 MED ORDER — DULAGLUTIDE 1.5 MG/0.5ML ~~LOC~~ SOAJ: 1.5000 mg | SUBCUTANEOUS | 5 refills | Status: DC

## 2017-06-21 NOTE — Telephone Encounter (Signed)
[facility] send a refill request for the following medication: sertraline (ZOLOFT) 25 MG tablet  Qty:30

## 2017-06-21 NOTE — Patient Instructions (Signed)

## 2017-06-21 NOTE — Progress Notes (Signed)
Patient: Andrea [name] Female    DOB: [birth year]   72 y.o.   MRN: [id number] Visit Date: 06/21/2017  Today's Provider: Mar Daring, PA-C   Chief Complaint  Patient presents with  . Diabetes  . Hyperlipidemia   Subjective:    HPI  Diabetes Mellitus Type II, Follow-up:   Lab Results  Component Value Date   HGBA1C 10.8 06/21/2017   HGBA1C 10.3 03/22/2017   HGBA1C 10.4 (H) 12/30/2016    Last seen for diabetes 3 months ago.  Management since then includes no changes. However, patient reports that she is taking her Lantus twice daily now.  She reports good compliance with treatment. She is not having side effects.  Current symptoms include none and have been resolved. Home blood sugar records: trend: fluctuating a bit  Episodes of hypoglycemia? no   Current Insulin Regimen: 65 units of Lantus twice daily.  Most Recent Eye Exam: due in 07/2017. Weight trend: stable Prior visit with dietician: no Current diet: well balanced Current exercise: no regular exercise  Pertinent Labs:    Component Value Date/Time   CHOL 144 12/16/2016 1539   CHOL 142 09/02/2015 1214   TRIG 133 12/16/2016 1539   HDL 67 12/16/2016 1539   HDL 67 09/02/2015 1214   LDLCALC 56 12/16/2016 1539   CREATININE 1.11 (H) 01/01/2017 0617   CREATININE 1.08 (H) 12/16/2016 1539    Wt Readings from Last 3 Encounters:  06/21/17 168 lb (76.2 kg)  04/12/17 153 lb (69.4 kg)  03/22/17 169 lb (76.7 kg)    Lipid/Cholesterol, Follow-up:   Last seen for this3 months ago.  Management changes since that visit include no changes. . Last Lipid Panel:    Component Value Date/Time   CHOL 144 12/16/2016 1539   CHOL 142 09/02/2015 1214   TRIG 133 12/16/2016 1539   HDL 67 12/16/2016 1539   HDL 67 09/02/2015 1214   CHOLHDL 2.1 12/16/2016 1539   LDLCALC 56 12/16/2016 1539    Risk factors for vascular disease include diabetes mellitus  She reports good compliance with treatment. She is not  having side effects.  Current symptoms include none and have been stable.     Allergies  Allergen Reactions  . Metformin And Related Diarrhea  . Sulfa Antibiotics Rash     Current Outpatient Medications:  .  acetaminophen (TYLENOL) 325 MG tablet, Take 650 mg 4 (four) times daily by mouth., Disp: , Rfl:  .  albuterol (PROVENTIL HFA;VENTOLIN HFA) 108 (90 Base) MCG/ACT inhaler, Inhale 2 puffs into the lungs every 6 (six) hours as needed for wheezing or shortness of breath., Disp: 1 Inhaler, Rfl: 2 .  Alcohol Swabs (ALCOHOL PREP) PADS, Use twice daily prior to SQ injection of insulin to clean skin, Disp: 100 each, Rfl: 6 .  aspirin EC 81 MG tablet, Take 81 mg daily by mouth., Disp: , Rfl:  .  atorvastatin (LIPITOR) 80 MG tablet, Take 1 tablet (80 mg total) by mouth daily., Disp: 90 tablet, Rfl: 3 .  Cyanocobalamin (B-12) 1000 MCG CAPS, Take 1,000 mcg by mouth daily. , Disp: , Rfl:  .  cyclobenzaprine (FLEXERIL) 5 MG tablet, Take 1 tablet (5 mg total) by mouth 3 (three) times daily as needed for muscle spasms., Disp: 50 tablet, Rfl: 1 .  docusate sodium (COLACE) 100 MG capsule, Take 1 capsule (100 mg total) by mouth 2 (two) times daily., Disp: 60 capsule, Rfl: 0 .  Dulaglutide (TRULICITY) 2.44 WN/0.2VO SOPN,  Inject 0.75 mg once a week into the skin. On Monday, Disp: , Rfl:  .  ergocalciferol (VITAMIN D2) 50000 units capsule, Take 1 capsule (50,000 Units total) by mouth once a week. On Fridays, Disp: 12 capsule, Rfl: 1 .  gabapentin (NEURONTIN) 600 MG tablet, Take 1 tablet (600 mg total) by mouth 3 (three) times daily., Disp: 90 tablet, Rfl: 5 .  HYDROcodone-acetaminophen (NORCO) 10-325 MG tablet, Take 1 tablet by mouth every 8 (eight) hours as needed., Disp: 90 tablet, Rfl: 0 .  insulin glargine (LANTUS) 100 UNIT/ML injection, Inject 0.65 mLs (65 Units total) into the skin 2 (two) times daily with a meal., Disp: 10 mL, Rfl: 11 .  Insulin Syringe-Needle U-100 29G 1 ML MISC, Use to inject 65 units  Lantus SQ BIDWC, Disp: 100 each, Rfl: 6 .  lidocaine-prilocaine (EMLA) cream, Apply 1 application topically as needed., Disp: 30 g, Rfl: 0 .  losartan (COZAAR) 25 MG tablet, Take 25 mg daily by mouth., Disp: , Rfl:  .  metoprolol succinate (TOPROL-XL) 25 MG 24 hr tablet, Take 0.5 tablets (12.5 mg total) by mouth daily., Disp: 45 tablet, Rfl: 3 .  Multiple Vitamins-Minerals (ICAPS AREDS 2 PO), Take 1 capsule by mouth 2 (two) times daily. , Disp: , Rfl:  .  nitrofurantoin (MACRODANTIN) 100 MG capsule, Take 100 mg at bedtime by mouth., Disp: , Rfl:  .  Omega-3 Fatty Acids (FISH OIL) 1200 MG CAPS, Take 1 capsule (1,200 mg total) by mouth daily., Disp: 90 capsule, Rfl: 3 .  omeprazole (PRILOSEC) 40 MG capsule, Take 1 capsule (40 mg total) by mouth daily., Disp: 90 capsule, Rfl: 3 .  ONETOUCH DELICA LANCETS 16S MISC, To check blood sugar daily, Disp: 100 each, Rfl: 5 .  ONETOUCH VERIO test strip, To check blood sugar once daily, Disp: 100 each, Rfl: 3 .  oxybutynin (DITROPAN-XL) 10 MG 24 hr tablet, Take 10 mg at bedtime by mouth., Disp: , Rfl:  .  senna (SENOKOT) 8.6 MG TABS tablet, Take 2 tablets 2 (two) times daily by mouth., Disp: , Rfl:  .  sertraline (ZOLOFT) 25 MG tablet, Take 25 mg at bedtime by mouth., Disp: , Rfl:  .  terconazole (TERAZOL 3) 0.8 % vaginal cream, Place 1 applicator vaginally at bedtime. Give 3 days and hold 4 days , for vaginal yeast infection ( per home regimen ) (Patient not taking: Reported on 06/21/2017), Disp: 20 g, Rfl: 11  Review of Systems  Constitutional: Negative for appetite change, fatigue and unexpected weight change.  Respiratory: Negative for apnea, cough, choking, chest tightness, shortness of breath, wheezing and stridor.   Cardiovascular: Negative for chest pain, palpitations and leg swelling.  Endocrine: Negative for cold intolerance, heat intolerance, polydipsia, polyphagia and polyuria.  Neurological: Positive for numbness.    Social History   Tobacco  Use  . Smoking status: Former Smoker    Packs/day: 1.00    Years: 30.00    Pack years: 30.00    Types: Cigarettes    Last attempt to quit: 07/17/2012    Years since quitting: 4.9  . Smokeless tobacco: Never Used  . Tobacco comment:    Substance Use Topics  . Alcohol use: Yes    Alcohol/week: 0.0 oz    Comment: Rarely   Objective:   BP 122/68 (BP Location: Left Arm, Patient Position: Sitting, Cuff Size: Normal)   Pulse 95   Temp 97.9 F (36.6 C)   Resp 16   Ht 5' (1.524 m)  Wt 168 lb (76.2 kg)   SpO2 95%   BMI 32.81 kg/m  Vitals:   06/21/17 1116  BP: 122/68  Pulse: 95  Resp: 16  Temp: 97.9 F (36.6 C)  SpO2: 95%  Weight: 168 lb (76.2 kg)  Height: 5' (1.524 m)     Physical Exam  Constitutional: She appears well-developed and well-nourished. No distress.  Neck: Normal range of motion. Neck supple. No JVD present. No tracheal deviation present. No thyromegaly present.  Cardiovascular: Normal rate, regular rhythm and normal heart sounds. Exam reveals no gallop and no friction rub.  No murmur heard. Pulmonary/Chest: Effort normal and breath sounds normal. No respiratory distress. She has no wheezes. She has no rales.  Musculoskeletal: She exhibits no edema.  Lymphadenopathy:    She has no cervical adenopathy.  Skin: She is not diaphoretic.  Vitals reviewed.      Assessment & Plan:     1. 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 Had long discussion about dietary habits and instructions on how to count carbs. Information also printed about carb counting. Will increase Trulicity to 1.5mg /0.10mL as below. Continue Lantus 65 units BID. I will see her back in 3 months. If still increasing or not much change will add a DPP4.  - POCT glycosylated hemoglobin (Hb A1C) - Dulaglutide (TRULICITY) 1.5 PJ/8.2NK SOPN; Inject 1.5  mg into the skin once a week.  Dispense: 4 pen; Refill: [place], PA-C  [facility]

## 2017-06-22 MED ORDER — SERTRALINE HCL 25 MG PO TABS: 25.0000 mg | ORAL_TABLET | Freq: Every day | ORAL | 1 refills | Status: DC

## 2017-06-22 NOTE — Telephone Encounter (Signed)
Refilled sertraline

## 2017-06-24 ENCOUNTER — Other Ambulatory Visit: Payer: Self-pay | Admitting: Physician Assistant

## 2017-06-24 DIAGNOSIS — E1142 Type 2 diabetes mellitus with diabetic polyneuropathy: Secondary | ICD-10-CM

## 2017-06-24 DIAGNOSIS — Z794 Long term (current) use of insulin: Secondary | ICD-10-CM

## 2017-06-24 NOTE — Telephone Encounter (Signed)
Jim with [facility] is requesting refill on the following medications:  insulin glargine (LANTUS) 100 UNIT/ML injection   Last Rx: 02/10/17 with 11 refills LOV: 06/21/17 Clair Gulling stated that pt has already used the 11 refills and needs new Rx for the medication. Please advise. Thanks TNP

## 2017-06-27 ENCOUNTER — Telehealth: Payer: Self-pay | Admitting: Physician Assistant

## 2017-06-27 MED ORDER — INSULIN GLARGINE 100 UNIT/ML ~~LOC~~ SOLN: 65.0000 [IU] | Freq: Two times a day (BID) | SUBCUTANEOUS | 11 refills | Status: DC

## 2017-06-27 NOTE — Telephone Encounter (Signed)
Pt called to check and see if her rx was sent to the pharmacy

## 2017-06-27 NOTE — Telephone Encounter (Signed)
Please review. Thanks!  

## 2017-06-27 NOTE — Telephone Encounter (Signed)
error 

## 2017-07-04 ENCOUNTER — Other Ambulatory Visit: Payer: Self-pay

## 2017-07-04 NOTE — Telephone Encounter (Signed)
Fax from covermymeds for prior authorization for trulicity.  Prior authorization initiated via covermymeds.  Waiting outcome.

## 2017-07-11 ENCOUNTER — Telehealth: Payer: Self-pay | Admitting: Physician Assistant

## 2017-07-11 DIAGNOSIS — N3281 Overactive bladder: Secondary | ICD-10-CM

## 2017-07-11 MED ORDER — OXYBUTYNIN CHLORIDE ER 10 MG PO TB24: 10.0000 mg | ORAL_TABLET | Freq: Every day | ORAL | 1 refills | Status: DC

## 2017-07-11 NOTE — Telephone Encounter (Signed)
refilled 

## 2017-07-11 NOTE — Telephone Encounter (Signed)
pt needs refill  Oxybutynin 10 mg   She uses [facility]  Pt call back is [phone]  Thanks teri

## 2017-07-14 LAB — HM DIABETES EYE EXAM

## 2017-07-20 ENCOUNTER — Other Ambulatory Visit: Payer: Self-pay | Admitting: Physician Assistant

## 2017-07-20 DIAGNOSIS — I10 Essential (primary) hypertension: Secondary | ICD-10-CM

## 2017-07-20 MED ORDER — METOPROLOL SUCCINATE ER 25 MG PO TB24: 12.5000 mg | ORAL_TABLET | Freq: Every day | ORAL | 3 refills | Status: DC

## 2017-07-20 NOTE — Telephone Encounter (Signed)
[facility] faxed a refill request for the following medication. Thanks CC  metoprolol succinate (TOPROL-XL) 25 MG 24 hr tablet

## 2017-07-21 ENCOUNTER — Telehealth: Payer: Self-pay | Admitting: Physician Assistant

## 2017-07-21 DIAGNOSIS — M4316 Spondylolisthesis, lumbar region: Secondary | ICD-10-CM

## 2017-07-21 MED ORDER — HYDROCODONE-ACETAMINOPHEN 10-325 MG PO TABS
1.0000 | ORAL_TABLET | Freq: Three times a day (TID) | ORAL | 0 refills | Status: DC | PRN
Start: 2017-07-21 — End: 2017-08-19

## 2017-07-21 NOTE — Telephone Encounter (Signed)
NCCSR reviewed. Rx sent in. 

## 2017-07-21 NOTE — Telephone Encounter (Signed)
Pt needs a refill on her hydrocodone 10-325  She uses [facility]  In [place] [employer]

## 2017-07-27 ENCOUNTER — Telehealth: Payer: Self-pay | Admitting: Physician Assistant

## 2017-07-27 NOTE — Telephone Encounter (Signed)
They can try the pharmacy program like she has done for her Jardiance.

## 2017-07-27 NOTE — Telephone Encounter (Signed)
Pt returned call

## 2017-07-27 NOTE — Telephone Encounter (Signed)
LMTCB

## 2017-07-27 NOTE — Telephone Encounter (Signed)
Pt Is calling saying the Trulicity 1.5 mg cost too much money.  She wants to know if There is something she can do to pay less.  Pt's call back is [phone]  Thanks teri

## 2017-07-28 NOTE — Telephone Encounter (Signed)
Patient advised as below.  

## 2017-08-19 ENCOUNTER — Other Ambulatory Visit: Payer: Self-pay

## 2017-08-19 DIAGNOSIS — M4316 Spondylolisthesis, lumbar region: Secondary | ICD-10-CM

## 2017-08-19 MED ORDER — HYDROCODONE-ACETAMINOPHEN 10-325 MG PO TABS: 1.0000 | ORAL_TABLET | Freq: Three times a day (TID) | ORAL | 0 refills | Status: DC | PRN

## 2017-08-19 NOTE — Telephone Encounter (Signed)
NCCSR reviewed. 

## 2017-08-26 ENCOUNTER — Other Ambulatory Visit: Payer: Self-pay | Admitting: Physician Assistant

## 2017-08-26 DIAGNOSIS — E559 Vitamin D deficiency, unspecified: Secondary | ICD-10-CM

## 2017-09-05 ENCOUNTER — Other Ambulatory Visit: Payer: Self-pay | Admitting: Physician Assistant

## 2017-09-05 DIAGNOSIS — I10 Essential (primary) hypertension: Secondary | ICD-10-CM

## 2017-09-05 MED ORDER — LOSARTAN POTASSIUM 50 MG PO TABS: 50.0000 mg | ORAL_TABLET | Freq: Every day | ORAL | 1 refills | Status: DC

## 2017-09-05 MED ORDER — LOSARTAN POTASSIUM 25 MG PO TABS: 25.0000 mg | ORAL_TABLET | Freq: Every day | ORAL | 1 refills | Status: DC

## 2017-09-05 NOTE — Addendum Note (Signed)
Addended by: Mar Daring on: 09/05/2017 02:25 PM   Modules accepted: Orders

## 2017-09-05 NOTE — Telephone Encounter (Signed)
Please Review

## 2017-09-05 NOTE — Telephone Encounter (Signed)
Pt advised.   Thanks,   -Denisia Harpole  

## 2017-09-05 NOTE — Telephone Encounter (Signed)
Pt states she is needing 50 MG instead of 25 MG.  States she wasn't sure why the medication was called in for 25 MG.  Informed the patient it appeared the original message had she was requesting 25 MG.  Pt states she never gave or confirmed a MG just called for a refill on the medication.  Pt wants to know if Tawanna Sat wants her to take 25 MG of Losartin or keep taking 50 MG.  States new RX will need to be called in if 50 MG.

## 2017-09-05 NOTE — Telephone Encounter (Signed)
Peds refill on her losartin 25mg   She uses [facility]   Pt's call back is [phone]  Thanks teri

## 2017-09-05 NOTE — Telephone Encounter (Signed)
Sent in 50mg 

## 2017-09-06 ENCOUNTER — Telehealth: Payer: Self-pay | Admitting: Physician Assistant

## 2017-09-06 NOTE — Telephone Encounter (Signed)
patient advised to continue 1/2 tablet daily as previous prescription.

## 2017-09-06 NOTE — Telephone Encounter (Signed)
Pt picked up her losartin form [facility] and the pill looks different and has different instructions.  Pt is concerned that something is not right with her prescription  Pt's call back is [phone]  Thanks teri

## 2017-09-07 NOTE — Telephone Encounter (Signed)
Agreed -

## 2017-09-21 ENCOUNTER — Ambulatory Visit (INDEPENDENT_AMBULATORY_CARE_PROVIDER_SITE_OTHER): Payer: Medicare Other | Admitting: Physician Assistant

## 2017-09-21 ENCOUNTER — Encounter: Payer: Self-pay | Admitting: Physician Assistant

## 2017-09-21 VITALS — BP 118/60 | HR 101 | Temp 97.7°F | Resp 16 | Ht 60.0 in | Wt 161.0 lb

## 2017-09-21 DIAGNOSIS — M2170 Unequal limb length (acquired), unspecified site: Secondary | ICD-10-CM

## 2017-09-21 DIAGNOSIS — E113299 Type 2 diabetes mellitus with mild nonproliferative diabetic retinopathy without macular edema, unspecified eye: Secondary | ICD-10-CM

## 2017-09-21 DIAGNOSIS — M4316 Spondylolisthesis, lumbar region: Secondary | ICD-10-CM | POA: Diagnosis not present

## 2017-09-21 DIAGNOSIS — Z794 Long term (current) use of insulin: Secondary | ICD-10-CM

## 2017-09-21 LAB — POCT GLYCOSYLATED HEMOGLOBIN (HGB A1C)
Est. average glucose Bld gHb Est-mCnc: 252
Hemoglobin A1C: 10.4 % — AB (ref 4.0–5.6)

## 2017-09-21 MED ORDER — HYDROCODONE-ACETAMINOPHEN 10-325 MG PO TABS: 1.0000 | ORAL_TABLET | Freq: Three times a day (TID) | ORAL | 0 refills | Status: DC | PRN

## 2017-09-21 NOTE — Progress Notes (Signed)
Patient: Andrea [name] Female    DOB: 1946-02-17   72 y.o.   MRN: [id number] Visit Date: 09/21/2017  Today's Provider: Mar Daring, PA-C   Chief Complaint  Patient presents with  . Follow-up    Diabetes   Subjective:    HPI  Diabetes Mellitus Type II, Follow-up:   Lab Results  Component Value Date   HGBA1C 10.4 (A) 09/21/2017   HGBA1C 10.8 06/21/2017   HGBA1C 10.3 03/22/2017    Last seen for diabetes 3 months ago.  Management since then includes long discussion about dietary habits and instructions on how to count carbs.  Information also printed about carb counting.Increased Trulicity to 1.5mg /0.3mL. Continue Lantus 65 units BID.If still increasing or not much change will add a DPP4.   She reports excellent compliance with treatment. She is not having side effects.  Current symptoms include none and have been stable. Home blood sugar records: fasting range: 120's-170's  Episodes of hypoglycemia? no  Current Insulin Regimen: Lantus 65 units BID, patient admits to not taking nighttime dose and eating a lot of fruit and starchy vegetables.  Most Recent Eye Exam: 07/2017 Weight trend: stable Prior visit with dietician: no Current diet: well balanced Current exercise: none  Pertinent Labs:    Component Value Date/Time   CHOL 144 12/16/2016 1539   CHOL 142 09/02/2015 1214   TRIG 133 12/16/2016 1539   HDL 67 12/16/2016 1539   HDL 67 09/02/2015 1214   LDLCALC 56 12/16/2016 1539   CREATININE 1.11 (H) 01/01/2017 0617   CREATININE 1.08 (H) 12/16/2016 1539    Wt Readings from Last 3 Encounters:  09/21/17 161 lb (73 kg)  06/21/17 168 lb (76.2 kg)  04/12/17 153 lb (69.4 kg)    ------------------------------------------------------------------------ Patient also reports that she stopped the Gabapentin. Reports neuropathy has been improved recently and is not having issues with lower extremity neuropathy. She has been using CBD oil without  recommendation.     Allergies  Allergen Reactions  . Metformin And Related Diarrhea  . Sulfa Antibiotics Rash     Current Outpatient Medications:  .  acetaminophen (TYLENOL) 325 MG tablet, Take 650 mg 4 (four) times daily by mouth., Disp: , Rfl:  .  albuterol (PROVENTIL HFA;VENTOLIN HFA) 108 (90 Base) MCG/ACT inhaler, Inhale 2 puffs into the lungs every 6 (six) hours as needed for wheezing or shortness of breath., Disp: 1 Inhaler, Rfl: 2 .  Alcohol Swabs (ALCOHOL PREP) PADS, Use twice daily prior to SQ injection of insulin to clean skin, Disp: 100 each, Rfl: 6 .  aspirin EC 81 MG tablet, Take 81 mg daily by mouth., Disp: , Rfl:  .  atorvastatin (LIPITOR) 80 MG tablet, Take 1 tablet (80 mg total) by mouth daily., Disp: 90 tablet, Rfl: 3 .  Cyanocobalamin (B-12) 1000 MCG CAPS, Take 1,000 mcg by mouth daily. , Disp: , Rfl:  .  cyclobenzaprine (FLEXERIL) 5 MG tablet, Take 1 tablet (5 mg total) by mouth 3 (three) times daily as needed for muscle spasms., Disp: 50 tablet, Rfl: 1 .  docusate sodium (COLACE) 100 MG capsule, Take 1 capsule (100 mg total) by mouth 2 (two) times daily., Disp: 60 capsule, Rfl: 0 .  Dulaglutide (TRULICITY) 1.5 UY/4.0HK SOPN, Inject 1.5 mg into the skin once a week., Disp: 4 pen, Rfl: 5 .  HYDROcodone-acetaminophen (NORCO) 10-325 MG tablet, Take 1 tablet by mouth every 8 (eight) hours as needed., Disp: 90 tablet, Rfl: 0 .  insulin glargine (LANTUS) 100 UNIT/ML injection, Inject 0.65 mLs (65 Units total) into the skin 2 (two) times daily with a meal., Disp: 10 mL, Rfl: 11 .  Insulin Syringe-Needle U-100 29G 1 ML MISC, Use to inject 65 units Lantus SQ BIDWC, Disp: 100 each, Rfl: 6 .  lidocaine-prilocaine (EMLA) cream, Apply 1 application topically as needed., Disp: 30 g, Rfl: 0 .  losartan (COZAAR) 50 MG tablet, Take 1 tablet (50 mg total) by mouth daily., Disp: 90 tablet, Rfl: 1 .  metoprolol succinate (TOPROL-XL) 25 MG 24 hr tablet, Take 0.5 tablets (12.5 mg total) by  mouth daily., Disp: 45 tablet, Rfl: 3 .  Multiple Vitamins-Minerals (ICAPS AREDS 2 PO), Take 1 capsule by mouth 2 (two) times daily. , Disp: , Rfl:  .  nitrofurantoin (MACRODANTIN) 100 MG capsule, Take 100 mg at bedtime by mouth., Disp: , Rfl:  .  Omega-3 Fatty Acids (FISH OIL) 1200 MG CAPS, Take 1 capsule (1,200 mg total) by mouth daily., Disp: 90 capsule, Rfl: 3 .  omeprazole (PRILOSEC) 40 MG capsule, Take 1 capsule (40 mg total) by mouth daily., Disp: 90 capsule, Rfl: 3 .  ONETOUCH DELICA LANCETS 11B MISC, To check blood sugar daily, Disp: 100 each, Rfl: 5 .  ONETOUCH VERIO test strip, To check blood sugar once daily, Disp: 100 each, Rfl: 3 .  oxybutynin (DITROPAN-XL) 10 MG 24 hr tablet, Take 1 tablet (10 mg total) by mouth at bedtime., Disp: 90 tablet, Rfl: 1 .  senna (SENOKOT) 8.6 MG TABS tablet, Take 2 tablets 2 (two) times daily by mouth., Disp: , Rfl:  .  sertraline (ZOLOFT) 25 MG tablet, Take 1 tablet (25 mg total) by mouth at bedtime., Disp: 90 tablet, Rfl: 1 .  terconazole (TERAZOL 3) 0.8 % vaginal cream, Place 1 applicator vaginally at bedtime. Give 3 days and hold 4 days , for vaginal yeast infection ( per home regimen ), Disp: 20 g, Rfl: 11 .  Vitamin D, Ergocalciferol, (DRISDOL) 50000 units CAPS capsule, TAKE 1 CAPSULE BY MOUTH ONCE WEEKLY ON THE SAME DAY EACH WEEK ON FRIDAYS, Disp: 12 capsule, Rfl: 1 .  gabapentin (NEURONTIN) 600 MG tablet, Take 1 tablet (600 mg total) by mouth 3 (three) times daily. (Patient not taking: Reported on 09/21/2017), Disp: 90 tablet, Rfl: 5  Review of Systems  Constitutional: Negative.   Respiratory: Negative.   Cardiovascular: Negative.   Musculoskeletal: Negative.   Neurological: Negative.     Social History   Tobacco Use  . Smoking status: Former Smoker    Packs/day: 1.00    Years: 30.00    Pack years: 30.00    Types: Cigarettes    Last attempt to quit: 07/17/2012    Years since quitting: 5.1  . Smokeless tobacco: Never Used  . Tobacco  comment:    Substance Use Topics  . Alcohol use: Yes    Alcohol/week: 0.0 oz    Comment: Rarely   Objective:   BP 118/60 (BP Location: Left Arm, Patient Position: Sitting, Cuff Size: Normal)   Pulse (!) 101   Temp 97.7 F (36.5 C) (Oral)   Resp 16   Ht 5' (1.524 m)   Wt 161 lb (73 kg)   SpO2 98%   BMI 31.44 kg/m  Vitals:   09/21/17 1127  BP: 118/60  Pulse: (!) 101  Resp: 16  Temp: 97.7 F (36.5 C)  TempSrc: Oral  SpO2: 98%  Weight: 161 lb (73 kg)  Height: 5' (1.524 m)  Physical Exam  Constitutional: She appears well-developed and well-nourished. No distress.  Neck: Normal range of motion. Neck supple.  Cardiovascular: Normal rate, regular rhythm and normal heart sounds. Exam reveals no gallop and no friction rub.  No murmur heard. Pulses:      Dorsalis pedis pulses are 2+ on the right side, and 2+ on the left side.       Posterior tibial pulses are 2+ on the right side, and 2+ on the left side.  Pulmonary/Chest: Effort normal and breath sounds normal. No respiratory distress. She has no wheezes. She has no rales.  Musculoskeletal: She exhibits no edema.       Right foot: There is normal range of motion and no deformity.       Left foot: There is normal range of motion and no deformity.  Feet:  Right Foot:  Protective Sensation: 10 sites tested. 10 sites sensed.  Skin Integrity: Negative for ulcer, blister, skin breakdown, erythema, warmth, callus or dry skin.  Left Foot:  Protective Sensation: 10 sites tested. 10 sites sensed.  Skin Integrity: Negative for ulcer, blister, skin breakdown, erythema, warmth, callus or dry skin.  Skin: She is not diaphoretic.  Vitals reviewed.      Assessment & Plan:     1. 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 Discussed importance of taking insulin as  prescribed. Also discussed decreasing fruit intake some as she is eating fruit with every meal. She voiced understanding. I will see her back in 3 months for CPE and labs.  - POCT glycosylated hemoglobin (Hb A1C)  2. Spondylolisthesis of lumbar region Stable. Diagnosis pulled for medication refill. Continue current medical treatment plan. - HYDROcodone-acetaminophen (NORCO) 10-325 MG tablet; Take 1 tablet by mouth every 8 (eight) hours as needed.  Dispense: 90 tablet; Refill: 0  3. Leg length discrepancy Left leg shorter by approximately 2 inches. Reports this was acquired secondary to hip surgery many years ago. Has one pair of shoes with a lift that are old and from initial diagnosis. Never got another pair. 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 Will try to refer her to a prosthetic company for new lift.        Mar Daring, PA-C  [facility]

## 2017-09-27 ENCOUNTER — Other Ambulatory Visit: Payer: Self-pay | Admitting: Physician Assistant

## 2017-09-27 DIAGNOSIS — E1142 Type 2 diabetes mellitus with diabetic polyneuropathy: Secondary | ICD-10-CM

## 2017-09-27 MED ORDER — ONETOUCH VERIO VI STRP: ORAL_STRIP | 3 refills | Status: DC

## 2017-09-27 NOTE — Telephone Encounter (Signed)
[facility] faxed a refill request for the following. Thanks CC  ONETOUCH VERIO test strip

## 2017-10-17 ENCOUNTER — Other Ambulatory Visit: Payer: Self-pay | Admitting: Physician Assistant

## 2017-10-17 DIAGNOSIS — M4316 Spondylolisthesis, lumbar region: Secondary | ICD-10-CM

## 2017-10-17 MED ORDER — HYDROCODONE-ACETAMINOPHEN 10-325 MG PO TABS: 1.0000 | ORAL_TABLET | Freq: Three times a day (TID) | ORAL | 0 refills | Status: DC | PRN

## 2017-10-17 NOTE — Telephone Encounter (Signed)
Pt needs a refill on her hydrocodone 10-325  She uses [facility]  In [place]  CB#  [phone]  Thanks [employer]

## 2017-10-24 ENCOUNTER — Other Ambulatory Visit: Payer: Self-pay | Admitting: Physician Assistant

## 2017-10-24 DIAGNOSIS — K219 Gastro-esophageal reflux disease without esophagitis: Secondary | ICD-10-CM

## 2017-10-24 MED ORDER — OMEPRAZOLE 40 MG PO CPDR: 40.0000 mg | DELAYED_RELEASE_CAPSULE | Freq: Every day | ORAL | 3 refills | Status: DC

## 2017-10-24 NOTE — Telephone Encounter (Signed)
[facility] faxed a refill request for the following medication. Thanks CC  omeprazole (PRILOSEC) 40 MG capsule

## 2017-11-18 ENCOUNTER — Other Ambulatory Visit: Payer: Self-pay

## 2017-11-18 DIAGNOSIS — M4316 Spondylolisthesis, lumbar region: Secondary | ICD-10-CM

## 2017-11-18 MED ORDER — HYDROCODONE-ACETAMINOPHEN 10-325 MG PO TABS: 1.0000 | ORAL_TABLET | Freq: Three times a day (TID) | ORAL | 0 refills | Status: DC | PRN

## 2017-11-18 NOTE — Telephone Encounter (Signed)
NCCSR reviewed. 

## 2017-12-11 ENCOUNTER — Other Ambulatory Visit: Payer: Self-pay | Admitting: Physician Assistant

## 2017-12-11 DIAGNOSIS — E559 Vitamin D deficiency, unspecified: Secondary | ICD-10-CM

## 2017-12-12 ENCOUNTER — Other Ambulatory Visit: Payer: Self-pay | Admitting: Physician Assistant

## 2017-12-12 DIAGNOSIS — E113299 Type 2 diabetes mellitus with mild nonproliferative diabetic retinopathy without macular edema, unspecified eye: Secondary | ICD-10-CM

## 2017-12-12 DIAGNOSIS — Z794 Long term (current) use of insulin: Principal | ICD-10-CM

## 2017-12-15 ENCOUNTER — Other Ambulatory Visit: Payer: Self-pay | Admitting: Physician Assistant

## 2017-12-15 DIAGNOSIS — E113299 Type 2 diabetes mellitus with mild nonproliferative diabetic retinopathy without macular edema, unspecified eye: Secondary | ICD-10-CM

## 2017-12-15 DIAGNOSIS — Z794 Long term (current) use of insulin: Principal | ICD-10-CM

## 2017-12-19 ENCOUNTER — Ambulatory Visit (INDEPENDENT_AMBULATORY_CARE_PROVIDER_SITE_OTHER): Payer: Medicare Other | Admitting: Physician Assistant

## 2017-12-19 ENCOUNTER — Ambulatory Visit (INDEPENDENT_AMBULATORY_CARE_PROVIDER_SITE_OTHER): Payer: Medicare Other

## 2017-12-19 ENCOUNTER — Encounter: Payer: Self-pay | Admitting: Physician Assistant

## 2017-12-19 VITALS — BP 128/52 | HR 98 | Temp 98.3°F | Ht 60.0 in | Wt 159.8 lb

## 2017-12-19 DIAGNOSIS — E7800 Pure hypercholesterolemia, unspecified: Secondary | ICD-10-CM | POA: Diagnosis not present

## 2017-12-19 DIAGNOSIS — Z0000 Encounter for general adult medical examination without abnormal findings: Secondary | ICD-10-CM | POA: Diagnosis not present

## 2017-12-19 DIAGNOSIS — Z13820 Encounter for screening for osteoporosis: Secondary | ICD-10-CM | POA: Diagnosis not present

## 2017-12-19 DIAGNOSIS — M810 Age-related osteoporosis without current pathological fracture: Secondary | ICD-10-CM

## 2017-12-19 DIAGNOSIS — Z794 Long term (current) use of insulin: Secondary | ICD-10-CM

## 2017-12-19 DIAGNOSIS — Z23 Encounter for immunization: Secondary | ICD-10-CM | POA: Diagnosis not present

## 2017-12-19 DIAGNOSIS — E113299 Type 2 diabetes mellitus with mild nonproliferative diabetic retinopathy without macular edema, unspecified eye: Secondary | ICD-10-CM | POA: Diagnosis not present

## 2017-12-19 DIAGNOSIS — M4316 Spondylolisthesis, lumbar region: Secondary | ICD-10-CM

## 2017-12-19 DIAGNOSIS — I5022 Chronic systolic (congestive) heart failure: Secondary | ICD-10-CM

## 2017-12-19 DIAGNOSIS — J410 Simple chronic bronchitis: Secondary | ICD-10-CM

## 2017-12-19 DIAGNOSIS — F331 Major depressive disorder, recurrent, moderate: Secondary | ICD-10-CM | POA: Insufficient documentation

## 2017-12-19 DIAGNOSIS — F3342 Major depressive disorder, recurrent, in full remission: Secondary | ICD-10-CM

## 2017-12-19 MED ORDER — HYDROCODONE-ACETAMINOPHEN 10-325 MG PO TABS: 1.0000 | ORAL_TABLET | Freq: Three times a day (TID) | ORAL | 0 refills | Status: DC | PRN

## 2017-12-19 NOTE — Patient Instructions (Signed)
Andrea [name] , Thank you for taking time to come for your Medicare Wellness Visit. I appreciate your ongoing commitment to your health goals. Please review the following plan we discussed and let me know if I can assist you in the future.   Screening recommendations/referrals: Colonoscopy: Up to date Mammogram: Up to date Bone Density: Referral sent today.  Recommended yearly ophthalmology/optometry visit for glaucoma screening and checkup Recommended yearly dental visit for hygiene and checkup  Vaccinations: Influenza vaccine: Wants to update today. Pneumococcal vaccine: Up to date Tdap vaccine: Pt declines today.  Shingles vaccine: Pt declines today.     Advanced directives: Please bring a copy of your POA (Power of Attorney) and/or Living Will to your next appointment.   Conditions/risks identified: Obesity- recommend to decrease portion sizes by eating 3 small healthy meals and at least 2 healthy snacks per day.  Next appointment: 2:20 PM with Andrea [name] today.   Preventive Care 46 Years and Older, Female Preventive care refers to lifestyle choices and visits with your health care provider that can promote health and wellness. What does preventive care include?  A yearly physical exam. This is also called an annual well check.  Dental exams once or twice a year.  Routine eye exams. Ask your health care provider how often you should have your eyes checked.  Personal lifestyle choices, including:  Daily care of your teeth and gums.  Regular physical activity.  Eating a healthy diet.  Avoiding tobacco and drug use.  Limiting alcohol use.  Practicing safe sex.  Taking low-dose aspirin every day.  Taking vitamin and mineral supplements as recommended by your health care provider. What happens during an annual well check? The services and screenings done by your health care provider during your annual well check will depend on your age, overall health, lifestyle  risk factors, and family history of disease. Counseling  Your health care provider may ask you questions about your:  Alcohol use.  Tobacco use.  Drug use.  Emotional well-being.  Home and relationship well-being.  Sexual activity.  Eating habits.  History of falls.  Memory and ability to understand (cognition).  Work and work Statistician.  Reproductive health. Screening  You may have the following tests or measurements:  Height, weight, and BMI.  Blood pressure.  Lipid and cholesterol levels. These may be checked every 5 years, or more frequently if you are over 36 years old.  Skin check.  Lung cancer screening. You may have this screening every year starting at age 41 if you have a 30-pack-year history of smoking and currently smoke or have quit within the past 15 years.  Fecal occult blood test (FOBT) of the stool. You may have this test every year starting at age 71.  Flexible sigmoidoscopy or colonoscopy. You may have a sigmoidoscopy every 5 years or a colonoscopy every 10 years starting at age 43.  Hepatitis C blood test.  Hepatitis B blood test.  Sexually transmitted disease (STD) testing.  Diabetes screening. This is done by checking your blood sugar (glucose) after you have not eaten for a while (fasting). You may have this done every 1-3 years.  Bone density scan. This is done to screen for osteoporosis. You may have this done starting at age 23.  Mammogram. This may be done every 1-2 years. Talk to your health care provider about how often you should have regular mammograms. Talk with your health care provider about your test results, treatment options, and if necessary, the  need for more tests. Vaccines  Your health care provider may recommend certain vaccines, such as:  Influenza vaccine. This is recommended every year.  Tetanus, diphtheria, and acellular pertussis (Tdap, Td) vaccine. You may need a Td booster every 10 years.  Zoster vaccine.  You may need this after age 9.  Pneumococcal 13-valent conjugate (PCV13) vaccine. One dose is recommended after age 21.  Pneumococcal polysaccharide (PPSV23) vaccine. One dose is recommended after age 67. Talk to your health care provider about which screenings and vaccines you need and how often you need them. This information is not intended to replace advice given to you by your health care provider. Make sure you discuss any questions you have with your health care provider. Document Released: 03/21/2015 Document Revised: 11/12/2015 Document Reviewed: 12/24/2014 Elsevier Interactive Patient Education  2017 [place] Prevention in the Home Falls can cause injuries. They can happen to people of all ages. There are many things you can do to make your home safe and to help prevent falls. What can I do on the outside of my home?  Regularly fix the edges of walkways and driveways and fix any cracks.  Remove anything that might make you trip as you walk through a door, such as a raised step or threshold.  Trim any bushes or trees on the path to your home.  Use bright outdoor lighting.  Clear any walking paths of anything that might make someone trip, such as rocks or tools.  Regularly check to see if handrails are loose or broken. Make sure that both sides of any steps have handrails.  Any raised decks and porches should have guardrails on the edges.  Have any leaves, snow, or ice cleared regularly.  Use sand or salt on walking paths during winter.  Clean up any spills in your garage right away. This includes oil or grease spills. What can I do in the bathroom?  Use night lights.  Install grab bars by the toilet and in the tub and shower. Do not use towel bars as grab bars.  Use non-skid mats or decals in the tub or shower.  If you need to sit down in the shower, use a plastic, non-slip stool.  Keep the floor dry. Clean up any water that spills on the floor as soon  as it happens.  Remove soap buildup in the tub or shower regularly.  Attach bath mats securely with double-sided non-slip rug tape.  Do not have throw rugs and other things on the floor that can make you trip. What can I do in the bedroom?  Use night lights.  Make sure that you have a light by your bed that is easy to reach.  Do not use any sheets or blankets that are too big for your bed. They should not hang down onto the floor.  Have a firm chair that has side arms. You can use this for support while you get dressed.  Do not have throw rugs and other things on the floor that can make you trip. What can I do in the kitchen?  Clean up any spills right away.  Avoid walking on wet floors.  Keep items that you use a lot in easy-to-reach places.  If you need to reach something above you, use a strong step stool that has a grab bar.  Keep electrical cords out of the way.  Do not use floor polish or wax that makes floors slippery. If you must use wax,  use non-skid floor wax.  Do not have throw rugs and other things on the floor that can make you trip. What can I do with my stairs?  Do not leave any items on the stairs.  Make sure that there are handrails on both sides of the stairs and use them. Fix handrails that are broken or loose. Make sure that handrails are as long as the stairways.  Check any carpeting to make sure that it is firmly attached to the stairs. Fix any carpet that is loose or worn.  Avoid having throw rugs at the top or bottom of the stairs. If you do have throw rugs, attach them to the floor with carpet tape.  Make sure that you have a light switch at the top of the stairs and the bottom of the stairs. If you do not have them, ask someone to add them for you. What else can I do to help prevent falls?  Wear shoes that:  Do not have high heels.  Have rubber bottoms.  Are comfortable and fit you well.  Are closed at the toe. Do not wear sandals.  If  you use a stepladder:  Make sure that it is fully opened. Do not climb a closed stepladder.  Make sure that both sides of the stepladder are locked into place.  Ask someone to hold it for you, if possible.  Clearly mark and make sure that you can see:  Any grab bars or handrails.  First and last steps.  Where the edge of each step is.  Use tools that help you move around (mobility aids) if they are needed. These include:  Canes.  Walkers.  Scooters.  Crutches.  Turn on the lights when you go into a dark area. Replace any light bulbs as soon as they burn out.  Set up your furniture so you have a clear path. Avoid moving your furniture around.  If any of your floors are uneven, fix them.  If there are any pets around you, be aware of where they are.  Review your medicines with your doctor. Some medicines can make you feel dizzy. This can increase your chance of falling. Ask your doctor what other things that you can do to help prevent falls. This information is not intended to replace advice given to you by your health care provider. Make sure you discuss any questions you have with your health care provider. Document Released: 12/19/2008 Document Revised: 07/31/2015 Document Reviewed: 03/29/2014 Elsevier Interactive Patient Education  2017 [employer].

## 2017-12-19 NOTE — Progress Notes (Signed)
Subjective:   Andrea [name] is a 72 y.o. female who presents for Medicare Annual (Subsequent) preventive examination.  Review of Systems:  N/A  Cardiac Risk Factors include: advanced age (>25men, >84 women);diabetes mellitus;dyslipidemia     Objective:     Vitals: BP (!) 128/52 (BP Location: Right Arm)   Pulse 98   Temp 98.3 F (36.8 C) (Oral)   Ht 5' (1.524 m)   Wt 159 lb 12.8 oz (72.5 kg)   BMI 31.21 kg/m   Body mass index is 31.21 kg/m.  Advanced Directives 12/19/2017 01/26/2017 01/18/2017 01/12/2017 12/31/2016 12/30/2016 12/28/2016  Does Patient Have a Medical Advance Directive? Yes No No No No No No  Type of Paramedic of [place];Living will - - - - - -  Copy of [place] in Chart? No - copy requested - - - - - -  Would patient like information on creating a medical advance directive? - No - Patient declined - No - Patient declined No - Patient declined No - Patient declined No - Patient declined    Tobacco Social History   Tobacco Use  Smoking Status Former Smoker  . Packs/day: 1.00  . Years: 30.00  . Pack years: 30.00  . Types: Cigarettes  . Last attempt to quit: 07/17/2012  . Years since quitting: 5.4  Smokeless Tobacco Never Used  Tobacco Comment         Counseling given: Not Answered Comment:     Clinical Intake:  Pre-visit preparation completed: Yes  Pain : 0-10 Pain Score: 0-No pain Pain Type: Chronic pain Pain Location: Leg Pain Orientation: Left Pain Descriptors / Indicators: Aching Pain Frequency: Constant    Nutrition Risk Assessment:  Has the patient had any N/V/D within the last 2 months?  No  Does the patient have any non-healing wounds?  No  Has the patient had any unintentional weight loss or weight gain?  No   Diabetes:  Is the patient diabetic?  Yes  If diabetic, was a CBG obtained today?  No  Did the patient bring in their glucometer from home?  No  How often do you monitor  your CBG's? Once a day.   Financial Strains and Diabetes Management:  Are you having any financial strains with the device, your supplies or your medication? No .  Does the patient want to be seen by Chronic Care Management for management of their diabetes?  No  Would the patient like to be referred to a Nutritionist or for Diabetic Management?  No   Diabetic Exams:  Diabetic Eye Exam: Completed 07/14/17.   Diabetic Foot Exam: Completed 12/16/16. Pt due for this today.     How often do you need to have someone help you when you read instructions, pamphlets, or other written materials from your doctor or pharmacy?: 1 - Never  Interpreter Needed?: No  Information entered by :: [facility], LPN  Past Medical History:  Diagnosis Date  . Bronchitis   . Chickenpox   . Coronary artery disease   . Depression   . Diabetes mellitus type 2, uncomplicated ([place])   . Diabetes mellitus without complication ([place])   . Diabetic retinopathy ([place])   . Dupuytren contracture   . Frequent urinary tract infections   . GERD (gastroesophageal reflux disease)   . History of hand surgery 2011   left hand  . Hyperlipidemia, unspecified   . Irritable bowel syndrome   . MI (myocardial infarction) ([place]) 1994  .  Neuromuscular disorder ([place])    nerve pain in back and legs  . Osteoporosis   . Pneumonia 2014  . Stroke ([place])   . TIA (transient ischemic attack)   . Vitamin D deficiency, unspecified   . Wheezing    Past Surgical History:  Procedure Laterality Date  . CARDIAC CATHETERIZATION  1994  . COLONOSCOPY     x3  . CORONARY ANGIOPLASTY  1994  . EYE SURGERY     cataracts  . HAND SURGERY Left 2011   left hand release of contractures  . HIP FRACTURE SURGERY  11/2013  . PR COLSC FLX W/REMOVAL LESION BY HOT BX FORCEPS   08/21/2015   Procedure: COLONOSCOPY, FLEXIBLE, PROXIMAL TO SPLENIC FLEXURE; W/REMOVAL TUMOR/POLYP/OTHER LESION, HOT BX FORCEP/CAUTE; Surgeon: Carlena Hurl, MD;  Location: OR [facility]; Service: General Surgery   Family History  Problem Relation Age of Onset  . Depression Daughter   . Arthritis Daughter        spine  . Plantar fasciitis Daughter   . Anxiety disorder Daughter   . Hyperlipidemia Daughter   . AAA (abdominal aortic aneurysm) Daughter   . Hypertension Mother   . Stroke Mother   . Cataracts Mother   . Depression Mother   . Heart disease Mother   . Asthma Brother   . Diabetes Daughter   . Hyperlipidemia Daughter   . Hypertension Daughter    Social History   Socioeconomic History  . Marital status: Married    Spouse name: Roselie Awkward  . Number of children: 3  . Years of education: 8  . Highest education level: 8th grade  Occupational History  . Occupation: retired  Scientific laboratory technician  . Financial resource strain: Not hard at all  . Food insecurity:    Worry: Never true    Inability: Never true  . Transportation needs:    Medical: No    Non-medical: No  Tobacco Use  . Smoking status: Former Smoker    Packs/day: 1.00    Years: 30.00    Pack years: 30.00    Types: Cigarettes    Last attempt to quit: 07/17/2012    Years since quitting: 5.4  . Smokeless tobacco: Never Used  . Tobacco comment:    Substance and Sexual Activity  . Alcohol use: Not Currently    Alcohol/week: 0.0 standard drinks  . Drug use: No  . Sexual activity: Not on file  Lifestyle  . Physical activity:    Days per week: Not on file    Minutes per session: Not on file  . Stress: Not at all  Relationships  . Social connections:    Talks on phone: Patient refused    Gets together: Patient refused    Attends religious service: Patient refused    Active member of club or organization: Patient refused    Attends meetings of clubs or organizations: Patient refused    Relationship status: Patient refused  Other Topics Concern  . Not on file  Social History Narrative  . Not on file    Outpatient Encounter Medications as of 12/19/2017  Medication Sig    . Alcohol Swabs (ALCOHOL PREP) PADS Use twice daily prior to SQ injection of insulin to clean skin  . aspirin EC 81 MG tablet Take 81 mg daily by mouth.  [name] Kitchen atorvastatin (LIPITOR) 80 MG tablet Take 1 tablet (80 mg total) by mouth daily.  . Cyanocobalamin (B-12) 1000 MCG CAPS Take 1,000 mcg by mouth daily.   [name] Kitchen HYDROcodone-acetaminophen ([place])  10-325 MG tablet Take 1 tablet by mouth every 8 (eight) hours as needed.  . insulin glargine (LANTUS) 100 UNIT/ML injection Inject 0.65 mLs (65 Units total) into the skin 2 (two) times daily with a meal.  . Insulin Syringe-Needle U-100 29G 1 ML MISC Use to inject 65 units Lantus SQ BIDWC  . losartan (COZAAR) 50 MG tablet Take 1 tablet (50 mg total) by mouth daily. (Patient taking differently: Take 25 mg by mouth daily. )  . metoprolol succinate (TOPROL-XL) 25 MG 24 hr tablet Take 0.5 tablets (12.5 mg total) by mouth daily.  . Multiple Vitamins-Minerals (ICAPS AREDS 2 PO) Take 1 capsule by mouth 2 (two) times daily.   . nitrofurantoin (MACRODANTIN) 100 MG capsule Take 100 mg at bedtime by mouth.  . Omega-3 Fatty Acids (FISH OIL) 1200 MG CAPS Take 1 capsule (1,200 mg total) by mouth daily.  [name] Kitchen omeprazole (PRILOSEC) 40 MG capsule Take 1 capsule (40 mg total) by mouth daily.  Glory Rosebush DELICA LANCETS 41Y MISC To check blood sugar daily  . ONETOUCH VERIO test strip To check blood sugar once daily  . oxybutynin (DITROPAN-XL) 10 MG 24 hr tablet Take 1 tablet (10 mg total) by mouth at bedtime.  . sertraline (ZOLOFT) 25 MG tablet Take 1 tablet (25 mg total) by mouth at bedtime.  . TRULICITY 1.5 SA/6.3KZ SOPN INJECT 1.5MG  INTO THE SKIN ONCE A WEEK AS DIRECTED  . Vitamin D, Ergocalciferol, (DRISDOL) 50000 units CAPS capsule TAKE ONE CAPSULE BY MOUTH ONCE WEEKLY ON THE SAME DAY EACH WEEK (ON FRIDAYS)  . acetaminophen (TYLENOL) 325 MG tablet Take 650 mg 4 (four) times daily by mouth.  [name] Kitchen albuterol (PROVENTIL HFA;VENTOLIN HFA) 108 (90 Base) MCG/ACT inhaler Inhale 2 puffs  into the lungs every 6 (six) hours as needed for wheezing or shortness of breath. (Patient not taking: Reported on 12/19/2017)  . cyclobenzaprine (FLEXERIL) 5 MG tablet Take 1 tablet (5 mg total) by mouth 3 (three) times daily as needed for muscle spasms. (Patient not taking: Reported on 12/19/2017)  . docusate sodium (COLACE) 100 MG capsule Take 1 capsule (100 mg total) by mouth 2 (two) times daily. (Patient not taking: Reported on 12/19/2017)  . lidocaine-prilocaine (EMLA) cream Apply 1 application topically as needed. (Patient not taking: Reported on 12/19/2017)  . senna (SENOKOT) 8.6 MG TABS tablet Take 2 tablets 2 (two) times daily by mouth.  . terconazole (TERAZOL 3) 0.8 % vaginal cream Place 1 applicator vaginally at bedtime. Give 3 days and hold 4 days , for vaginal yeast infection ( per home regimen ) (Patient not taking: Reported on 12/19/2017)   No facility-administered encounter medications on file as of 12/19/2017.     Activities of Daily Living In your present state of health, do you have any difficulty performing the following activities: 12/19/2017 12/30/2016  Hearing? N N  Vision? N N  Difficulty concentrating or making decisions? Y N  Walking or climbing stairs? Y Y  Comment Due to leg pains.  -  Dressing or bathing? N N  Comment Husband helps with showers. -  Doing errands, shopping? N N  Preparing Food and eating ? Y -  Using the Toilet? N -  In the past six months, have you accidently leaked urine? N -  Do you have problems with loss of bowel control? N -  Managing your Medications? N -  Managing your Finances? N -  Housekeeping or managing your Housekeeping? Y -  Comment Has assistance with cleaning. Does  easy tasks.  -  Some recent data might be hidden    Patient Care Team: Mar Daring, PA-C as PCP - General (Family Medicine) Dingeldein, Remo Lipps, MD as Consulting Physician (Ophthalmology) Newman Pies, MD as Consulting Physician  (Neurosurgery) Minna Merritts, MD as Consulting Physician (Cardiology)    Assessment:   This is a routine wellness examination for Halyn.  Exercise Activities and Dietary recommendations Current Exercise Habits: The patient does not participate in regular exercise at present, Exercise limited by: orthopedic condition(s)  Goals    . Decrease soda or juice intake     Recommend decreasing caffeine by 2 glasses a dayand drinking 3 glasses of water a day as a subsitute.    . Have 3 meals a day     Recommend to decrease portion sizes by eating 3 small healthy meals and at least 2 healthy snacks per day.         Fall Risk Fall Risk  12/19/2017 12/16/2016 11/11/2015  Falls in the past year? No No No   FALL RISK PREVENTION PERTAINING TO THE HOME:  Any stairs in or around the home WITH handrails? No  Home free of loose throw rugs in walkways, pet beds, electrical cords, etc? Yes  Adequate lighting in your home to reduce risk of falls? Yes   ASSISTIVE DEVICES UTILIZED TO PREVENT FALLS:  Life alert? No  Use of a cane, walker or w/c? Yes  Grab bars in the bathroom? No  Shower chair or bench in shower? Yes  Elevated toilet seat or a handicapped toilet? Yes   DME ORDERS:  DME order needed?  No   TIMED UP AND GO:  Was the test performed? No .    Depression Screen PHQ 2/9 Scores 12/19/2017 12/16/2016 12/16/2016 12/16/2016  PHQ - 2 Score 0 0 0 0  PHQ- 9 Score - 0 0 -     Cognitive Function     6CIT Screen 12/19/2017 12/16/2016  What Year? 0 points 0 points  What month? 0 points 0 points  What time? 0 points 0 points  Count back from 20 0 points 0 points  Months in reverse 0 points 2 points  Repeat phrase 4 points 4 points  Total Score 4 6    Immunization History  Administered Date(s) Administered  . Influenza, High Dose Seasonal PF 02/12/2016, 12/16/2016    Qualifies for Shingles Vaccine? Yes . Due for Shingrix. Education has been provided regarding the  importance of this vaccine. Pt has been advised to call insurance company to determine out of pocket expense. Advised may also receive vaccine at local pharmacy or Health Dept. Verbalized acceptance and understanding.  Tdap: Although this vaccine is not a covered service during a Wellness Exam, does the patient still wish to receive this vaccine today?  No .  Education has been provided regarding the importance of this vaccine. Advised may receive this vaccine at local pharmacy or Health Dept. Aware to provide a copy of the vaccination record if obtained from local pharmacy or Health Dept. Verbalized acceptance and understanding.  Flu Vaccine: Due for Flu vaccine. Does the patient want to receive this vaccine today?  Yes . Education has been provided regarding the importance of this vaccine but still declined. Advised may receive this vaccine at local pharmacy or Health Dept. Aware to provide a copy of the vaccination record if obtained from local pharmacy or Health Dept. Verbalized acceptance and understanding.  Pneumococcal Vaccine: Up to date    Screening  Tests Health Maintenance  Topic Date Due  . TETANUS/TDAP  10/02/1964  . INFLUENZA VACCINE  10/06/2017  . DEXA SCAN  11/26/2017  . FOOT EXAM  12/16/2017  . HEMOGLOBIN A1C  12/22/2017  . OPHTHALMOLOGY EXAM  07/15/2018  . COLONOSCOPY  08/21/2018  . MAMMOGRAM  04/13/2019  . Hepatitis C Screening  Completed  . PNA vac Low Risk Adult  Completed   Cancer Screenings:  Colorectal Screening: Completed 08/21/15. Repeat every 3 years.  Mammogram: Completed 04/12/17.   Bone Density: Completed 11/27/15. Ordered today. Pt aware the office will call re: appt.  Lung Cancer Screening: (Low Dose CT Chest recommended if Age 30-80 years, 30 pack-year currently smoking OR have quit w/in 15years.) does qualify, however declines today.   Additional Screening:  Hepatitis C Screening: Up to date  Vision Screening: Recommended annual ophthalmology  exams for early detection of glaucoma and other disorders of the eye. Dental Screening: Recommended annual dental exams for proper oral hygiene  Community Resource Referral:  CRR required this visit?  No       Plan:  I have personally reviewed and addressed the Medicare Annual Wellness questionnaire and have noted the following in the patient's chart:  A. Medical and social history B. Use of alcohol, tobacco or illicit drugs  C. Current medications and supplements D. Functional ability and status E.  Nutritional status F.  Physical activity G. Advance directives H. List of other physicians I.  Hospitalizations, surgeries, and ER visits in previous 12 months J.  [place] such as hearing and vision if needed, cognitive and depression L. Referrals and appointments - none  In addition, I have reviewed and discussed with patient certain preventive protocols, quality metrics, and best practice recommendations. A written personalized care plan for preventive services as well as general preventive health recommendations were provided to patient.  See attached scanned questionnaire for additional information.   Signed,  Fabio Neighbors, LPN Nurse Health Advisor   Nurse Recommendations: Pt needs her influenza vaccine (was behind schedule) and diabetic foot exam today.. Pt declined the tetanus vaccine today.

## 2017-12-19 NOTE — Patient Instructions (Signed)
Health Maintenance for Postmenopausal Women Menopause is a normal process in which your reproductive ability comes to an end. This process happens gradually over a span of months to years, usually between the ages of 22 and 9. Menopause is complete when you have missed 12 consecutive menstrual periods. It is important to talk with your health care provider about some of the most common conditions that affect postmenopausal women, such as heart disease, cancer, and bone loss (osteoporosis). Adopting a healthy lifestyle and getting preventive care can help to promote your health and wellness. Those actions can also lower your chances of developing some of these common conditions. What should I know about menopause? During menopause, you may experience a number of symptoms, such as:  Moderate-to-severe hot flashes.  Night sweats.  Decrease in sex drive.  Mood swings.  Headaches.  Tiredness.  Irritability.  Memory problems.  Insomnia.  Choosing to treat or not to treat menopausal changes is an individual decision that you make with your health care provider. What should I know about hormone replacement therapy and supplements? Hormone therapy products are effective for treating symptoms that are associated with menopause, such as hot flashes and night sweats. Hormone replacement carries certain risks, especially as you become older. If you are thinking about using estrogen or estrogen with progestin treatments, discuss the benefits and risks with your health care provider. What should I know about heart disease and stroke? Heart disease, heart attack, and stroke become more likely as you age. This may be due, in part, to the hormonal changes that your body experiences during menopause. These can affect how your body processes dietary fats, triglycerides, and cholesterol. Heart attack and stroke are both medical emergencies. There are many things that you can do to help prevent heart disease  and stroke:  Have your blood pressure checked at least every 1-2 years. High blood pressure causes heart disease and increases the risk of stroke.  If you are 53-22 years old, ask your health care provider if you should take aspirin to prevent a heart attack or a stroke.  Do not use any tobacco products, including cigarettes, chewing tobacco, or electronic cigarettes. If you need help quitting, ask your health care provider.  It is important to eat a healthy diet and maintain a healthy weight. ? Be sure to include plenty of vegetables, fruits, low-fat dairy products, and lean protein. ? Avoid eating foods that are high in solid fats, added sugars, or salt (sodium).  Get regular exercise. This is one of the most important things that you can do for your health. ? Try to exercise for at least 150 minutes each week. The type of exercise that you do should increase your heart rate and make you sweat. This is known as moderate-intensity exercise. ? Try to do strengthening exercises at least twice each week. Do these in addition to the moderate-intensity exercise.  Know your numbers.Ask your health care provider to check your cholesterol and your blood glucose. Continue to have your blood tested as directed by your health care provider.  What should I know about cancer screening? There are several types of cancer. Take the following steps to reduce your risk and to catch any cancer development as early as possible. Breast Cancer  Practice breast self-awareness. ? This means understanding how your breasts normally appear and feel. ? It also means doing regular breast self-exams. Let your health care provider know about any changes, no matter how small.  If you are 40  or older, have a clinician do a breast exam (clinical breast exam or CBE) every year. Depending on your age, family history, and medical history, it may be recommended that you also have a yearly breast X-ray (mammogram).  If you  have a family history of breast cancer, talk with your health care provider about genetic screening.  If you are at high risk for breast cancer, talk with your health care provider about having an MRI and a mammogram every year.  Breast cancer (BRCA) gene test is recommended for women who have family members with BRCA-related cancers. Results of the assessment will determine the need for genetic counseling and BRCA1 and for BRCA2 testing. BRCA-related cancers include these types: ? Breast. This occurs in males or females. ? Ovarian. ? Tubal. This may also be called fallopian tube cancer. ? Cancer of the abdominal or pelvic lining (peritoneal cancer). ? Prostate. ? Pancreatic.  Cervical, Uterine, and Ovarian Cancer Your health care provider may recommend that you be screened regularly for cancer of the pelvic organs. These include your ovaries, uterus, and vagina. This screening involves a pelvic exam, which includes checking for microscopic changes to the surface of your cervix (Pap test).  For women ages 21-65, health care providers may recommend a pelvic exam and a Pap test every three years. For women ages 79-65, they may recommend the Pap test and pelvic exam, combined with testing for human papilloma virus (HPV), every five years. Some types of HPV increase your risk of cervical cancer. Testing for HPV may also be done on women of any age who have unclear Pap test results.  Other health care providers may not recommend any screening for nonpregnant women who are considered low risk for pelvic cancer and have no symptoms. Ask your health care provider if a screening pelvic exam is right for you.  If you have had past treatment for cervical cancer or a condition that could lead to cancer, you need Pap tests and screening for cancer for at least 20 years after your treatment. If Pap tests have been discontinued for you, your risk factors (such as having a new sexual partner) need to be  reassessed to determine if you should start having screenings again. Some women have medical problems that increase the chance of getting cervical cancer. In these cases, your health care provider may recommend that you have screening and Pap tests more often.  If you have a family history of uterine cancer or ovarian cancer, talk with your health care provider about genetic screening.  If you have vaginal bleeding after reaching menopause, tell your health care provider.  There are currently no reliable tests available to screen for ovarian cancer.  Lung Cancer Lung cancer screening is recommended for adults 69-62 years old who are at high risk for lung cancer because of a history of smoking. A yearly low-dose CT scan of the lungs is recommended if you:  Currently smoke.  Have a history of at least 30 pack-years of smoking and you currently smoke or have quit within the past 15 years. A pack-year is smoking an average of one pack of cigarettes per day for one year.  Yearly screening should:  Continue until it has been 15 years since you quit.  Stop if you develop a health problem that would prevent you from having lung cancer treatment.  Colorectal Cancer  This type of cancer can be detected and can often be prevented.  Routine colorectal cancer screening usually begins at  age 42 and continues through age 45.  If you have risk factors for colon cancer, your health care provider may recommend that you be screened at an earlier age.  If you have a family history of colorectal cancer, talk with your health care provider about genetic screening.  Your health care provider may also recommend using home test kits to check for hidden blood in your stool.  A small camera at the end of a tube can be used to examine your colon directly (sigmoidoscopy or colonoscopy). This is done to check for the earliest forms of colorectal cancer.  Direct examination of the colon should be repeated every  5-10 years until age 71. However, if early forms of precancerous polyps or small growths are found or if you have a family history or genetic risk for colorectal cancer, you may need to be screened more often.  Skin Cancer  Check your skin from head to toe regularly.  Monitor any moles. Be sure to tell your health care provider: ? About any new moles or changes in moles, especially if there is a change in a mole's shape or color. ? If you have a mole that is larger than the size of a pencil eraser.  If any of your family members has a history of skin cancer, especially at a young age, talk with your health care provider about genetic screening.  Always use sunscreen. Apply sunscreen liberally and repeatedly throughout the day.  Whenever you are outside, protect yourself by wearing long sleeves, pants, a wide-brimmed hat, and sunglasses.  What should I know about osteoporosis? Osteoporosis is a condition in which bone destruction happens more quickly than new bone creation. After menopause, you may be at an increased risk for osteoporosis. To help prevent osteoporosis or the bone fractures that can happen because of osteoporosis, the following is recommended:  If you are 46-71 years old, get at least 1,000 mg of calcium and at least 600 mg of vitamin D per day.  If you are older than age 55 but younger than age 65, get at least 1,200 mg of calcium and at least 600 mg of vitamin D per day.  If you are older than age 54, get at least 1,200 mg of calcium and at least 800 mg of vitamin D per day.  Smoking and excessive alcohol intake increase the risk of osteoporosis. Eat foods that are rich in calcium and vitamin D, and do weight-bearing exercises several times each week as directed by your health care provider. What should I know about how menopause affects my mental health? Depression may occur at any age, but it is more common as you become older. Common symptoms of depression  include:  Low or sad mood.  Changes in sleep patterns.  Changes in appetite or eating patterns.  Feeling an overall lack of motivation or enjoyment of activities that you previously enjoyed.  Frequent crying spells.  Talk with your health care provider if you think that you are experiencing depression. What should I know about immunizations? It is important that you get and maintain your immunizations. These include:  Tetanus, diphtheria, and pertussis (Tdap) booster vaccine.  Influenza every year before the flu season begins.  Pneumonia vaccine.  Shingles vaccine.  Your health care provider may also recommend other immunizations. This information is not intended to replace advice given to you by your health care provider. Make sure you discuss any questions you have with your health care provider. Document Released: 04/16/2005  Document Revised: 09/12/2015 Document Reviewed: 11/26/2014 [employer] Interactive Patient Education  [employer].

## 2017-12-19 NOTE — Progress Notes (Signed)
Patient: Andrea [name], Female    DOB: 09-17-1945, 72 y.o.   MRN: [id number] Visit Date: 12/19/2017  Today's Provider: Mar Daring, PA-C   Chief Complaint  Patient presents with  . Annual Exam   Subjective:     Complete Physical Andrea [name] is a 72 y.o. female. She feels well. She reports exercising none. She reports she is sleeping well. -----------------------------------------------------------   Review of Systems  Constitutional: Negative.   HENT: Negative.   Eyes: Negative.   Respiratory: Negative.   Gastrointestinal: Negative.   Endocrine: Positive for cold intolerance.  Genitourinary: Negative.   Musculoskeletal: Positive for arthralgias, back pain and gait problem.  Skin: Negative.   Allergic/Immunologic: Negative.   Hematological: Bruises/bleeds easily.  Psychiatric/Behavioral: Positive for agitation.    Social History   Socioeconomic History  . Marital status: Married    Spouse name: Roselie Awkward  . Number of children: 3  . Years of education: 8  . Highest education level: 8th grade  Occupational History  . Occupation: retired  Scientific laboratory technician  . Financial resource strain: Not hard at all  . Food insecurity:    Worry: Never true    Inability: Never true  . Transportation needs:    Medical: No    Non-medical: No  Tobacco Use  . Smoking status: Former Smoker    Packs/day: 1.00    Years: 30.00    Pack years: 30.00    Types: Cigarettes    Last attempt to quit: 07/17/2012    Years since quitting: 5.4  . Smokeless tobacco: Never Used  . Tobacco comment:    Substance and Sexual Activity  . Alcohol use: Not Currently    Alcohol/week: 0.0 standard drinks  . Drug use: No  . Sexual activity: Not on file  Lifestyle  . Physical activity:    Days per week: Not on file    Minutes per session: Not on file  . Stress: Not at all  Relationships  . Social connections:    Talks on phone: Patient refused    Gets together: Patient refused     Attends religious service: Patient refused    Active member of club or organization: Patient refused    Attends meetings of clubs or organizations: Patient refused    Relationship status: Patient refused  . Intimate partner violence:    Fear of current or ex partner: Patient refused    Emotionally abused: Patient refused    Physically abused: Patient refused    Forced sexual activity: Patient refused  Other Topics Concern  . Not on file  Social History Narrative  . Not on file    Past Medical History:  Diagnosis Date  . Bronchitis   . Chickenpox   . Coronary artery disease   . Depression   . Diabetes mellitus type 2, uncomplicated ([place])   . Diabetes mellitus without complication ([place])   . Diabetic retinopathy ([place])   . Dupuytren contracture   . Frequent urinary tract infections   . GERD (gastroesophageal reflux disease)   . History of hand surgery 2011   left hand  . Hyperlipidemia, unspecified   . Irritable bowel syndrome   . MI (myocardial infarction) ([place]) 1994  . Neuromuscular disorder ([place])    nerve pain in back and legs  . Osteoporosis   . Pneumonia 2014  . Stroke ([place])   . TIA (transient ischemic attack)   . Vitamin D deficiency, unspecified   . Wheezing  Patient Active Problem List   Diagnosis Date Noted  . Leg length discrepancy 09/21/2017  . CAD (coronary artery disease), native coronary artery 01/24/2017  . Spondylolisthesis of lumbar region 12/30/2016  . Obese 04/16/2015  . History of colon polyps 11/23/2012  . Bundle branch block, 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  . 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 Absolute anemia 08/17/2012  . Chronic obstructive pulmonary disease ([place]) 08/17/2012  . HLD (hyperlipidemia) 08/17/2012  . OP (osteoporosis) 08/17/2012  . Type 2 diabetes mellitus ([place]) 08/17/2012  . Chronic systolic heart failure ([place]) 07/30/2012    Past Surgical History:   Procedure Laterality Date  . CARDIAC CATHETERIZATION  1994  . COLONOSCOPY     x3  . CORONARY ANGIOPLASTY  1994  . EYE SURGERY     cataracts  . HAND SURGERY Left 2011   left hand release of contractures  . HIP FRACTURE SURGERY  11/2013  . PR COLSC FLX W/REMOVAL LESION BY HOT BX FORCEPS   08/21/2015   Procedure: COLONOSCOPY, FLEXIBLE, PROXIMAL TO SPLENIC FLEXURE; W/REMOVAL TUMOR/POLYP/OTHER LESION, HOT BX FORCEP/CAUTE; Surgeon: Carlena Hurl, MD; Location: OR [facility]; Service: General Surgery    Her family history includes AAA (abdominal aortic aneurysm) in her daughter; Anxiety disorder in her daughter; Arthritis in her daughter; Asthma in her brother; Cataracts in her mother; Depression in her daughter and mother; Diabetes in her daughter; Heart disease in her mother; Hyperlipidemia in her daughter and daughter; Hypertension in her daughter and mother; Plantar fasciitis in her daughter; Stroke in her mother.      Current Outpatient Medications:  .  acetaminophen (TYLENOL) 325 MG tablet, Take 650 mg 4 (four) times daily by mouth., Disp: , Rfl:  .  albuterol (PROVENTIL HFA;VENTOLIN HFA) 108 (90 Base) MCG/ACT inhaler, Inhale 2 puffs into the lungs every 6 (six) hours as needed for wheezing or shortness of breath., Disp: 1 Inhaler, Rfl: 2 .  Alcohol Swabs (ALCOHOL PREP) PADS, Use twice daily prior to SQ injection of insulin to clean skin, Disp: 100 each, Rfl: 6 .  aspirin EC 81 MG tablet, Take 81 mg daily by mouth., Disp: , Rfl:  .  atorvastatin (LIPITOR) 80 MG tablet, Take 1 tablet (80 mg total) by mouth daily., Disp: 90 tablet, Rfl: 3 .  Cyanocobalamin (B-12) 1000 MCG CAPS, Take 1,000 mcg by mouth daily. , Disp: , Rfl:  .  cyclobenzaprine (FLEXERIL) 5 MG tablet, Take 1 tablet (5 mg total) by mouth 3 (three) times daily as needed for muscle spasms., Disp: 50 tablet, Rfl: 1 .  docusate sodium (COLACE) 100 MG capsule, Take 1 capsule (100 mg total) by mouth 2 (two) times daily.,  Disp: 60 capsule, Rfl: 0 .  HYDROcodone-acetaminophen (NORCO) 10-325 MG tablet, Take 1 tablet by mouth every 8 (eight) hours as needed., Disp: 90 tablet, Rfl: 0 .  insulin glargine (LANTUS) 100 UNIT/ML injection, Inject 0.65 mLs (65 Units total) into the skin 2 (two) times daily with a meal., Disp: 10 mL, Rfl: 11 .  Insulin Syringe-Needle U-100 29G 1 ML MISC, Use to inject 65 units Lantus SQ BIDWC, Disp: 100 each, Rfl: 6 .  lidocaine-prilocaine (EMLA) cream, Apply 1 application topically as needed., Disp: 30 g, Rfl: 0 .  losartan (COZAAR) 50 MG tablet, Take 1 tablet (50 mg total) by mouth daily. (Patient taking differently: Take 25 mg by mouth daily. ), Disp: 90 tablet, Rfl: 1 .  metoprolol succinate (TOPROL-XL) 25 MG 24 hr  tablet, Take 0.5 tablets (12.5 mg total) by mouth daily., Disp: 45 tablet, Rfl: 3 .  Multiple Vitamins-Minerals (ICAPS AREDS 2 PO), Take 1 capsule by mouth 2 (two) times daily. , Disp: , Rfl:  .  nitrofurantoin (MACRODANTIN) 100 MG capsule, Take 100 mg at bedtime by mouth., Disp: , Rfl:  .  Omega-3 Fatty Acids (FISH OIL) 1200 MG CAPS, Take 1 capsule (1,200 mg total) by mouth daily., Disp: 90 capsule, Rfl: 3 .  omeprazole (PRILOSEC) 40 MG capsule, Take 1 capsule (40 mg total) by mouth daily., Disp: 90 capsule, Rfl: 3 .  ONETOUCH DELICA LANCETS 63Z MISC, To check blood sugar daily, Disp: 100 each, Rfl: 5 .  ONETOUCH VERIO test strip, To check blood sugar once daily, Disp: 100 each, Rfl: 3 .  oxybutynin (DITROPAN-XL) 10 MG 24 hr tablet, Take 1 tablet (10 mg total) by mouth at bedtime., Disp: 90 tablet, Rfl: 1 .  senna (SENOKOT) 8.6 MG TABS tablet, Take 2 tablets 2 (two) times daily by mouth., Disp: , Rfl:  .  sertraline (ZOLOFT) 25 MG tablet, Take 1 tablet (25 mg total) by mouth at bedtime., Disp: 90 tablet, Rfl: 1 .  terconazole (TERAZOL 3) 0.8 % vaginal cream, Place 1 applicator vaginally at bedtime. Give 3 days and hold 4 days , for vaginal yeast infection ( per home regimen ),  Disp: 20 g, Rfl: 11 .  TRULICITY 1.5 CH/8.8FO SOPN, INJECT 1.5MG  INTO THE SKIN ONCE A WEEK AS DIRECTED, Disp: 2 pen, Rfl: 2 .  Vitamin D, Ergocalciferol, (DRISDOL) 50000 units CAPS capsule, TAKE ONE CAPSULE BY MOUTH ONCE WEEKLY ON THE SAME DAY EACH WEEK (ON FRIDAYS), Disp: 4 capsule, Rfl: 5  Patient Care Team: Mar Daring, PA-C as PCP - General (Family Medicine) Dingeldein, Remo Lipps, MD as Consulting Physician (Ophthalmology) Newman Pies, MD as Consulting Physician (Neurosurgery) Rockey Situ, Kathlene November, MD as Consulting Physician (Cardiology)     Objective:  Vitals: BP (!) 128/52 (BP Location: Right Arm)   Pulse 98   Temp 98.3 F (36.8 C) (Oral)   Ht 5' (1.524 m)   Wt 159 lb 12.8 oz (72.5 kg)   BMI 31.21 kg/m   Body mass index is 31.21 kg/m.  Physical Exam  Constitutional: She is oriented to person, place, and time. She appears well-developed and well-nourished. No distress.  HENT:  Head: Normocephalic and atraumatic.  Right Ear: Hearing, tympanic membrane, external ear and ear canal normal.  Left Ear: Hearing, tympanic membrane, external ear and ear canal normal.  Nose: Nose normal.  Mouth/Throat: Uvula is midline, oropharynx is clear and moist and mucous membranes are normal. No oropharyngeal exudate.  Eyes: Pupils are equal, round, and reactive to light. Conjunctivae and EOM are normal. Right eye exhibits no discharge. Left eye exhibits no discharge. No scleral icterus.  Neck: Normal range of motion. Neck supple. No JVD present. Carotid bruit is not present. No tracheal deviation present. No thyromegaly present.  Cardiovascular: Normal rate, regular rhythm, normal heart sounds and intact distal pulses. Exam reveals no gallop and no friction rub.  No murmur heard. Pulmonary/Chest: Effort normal and breath sounds normal. No respiratory distress. She has no wheezes. She has no rales. She exhibits no tenderness.  Abdominal: Soft. Bowel sounds are normal. She exhibits no  distension and no mass. There is no tenderness. There is no rebound and no guarding.  Musculoskeletal: Normal range of motion. She exhibits no edema or tenderness.  Lymphadenopathy:    She has no cervical adenopathy.  Neurological: She is alert and oriented to person, place, and time.  Skin: Skin is warm and dry. No rash noted. She is not diaphoretic.  Psychiatric: She has a normal mood and affect. Her behavior is normal. Judgment and thought content normal.  Vitals reviewed.   Activities of Daily Living In your present state of health, do you have any difficulty performing the following activities: 12/19/2017 12/30/2016  Hearing? N N  Vision? N N  Difficulty concentrating or making decisions? Y N  Walking or climbing stairs? Y Y  Comment Due to leg pains.  -  Dressing or bathing? N N  Comment Husband helps with showers. -  Doing errands, shopping? N N  Preparing Food and eating ? Y -  Using the Toilet? N -  In the past six months, have you accidently leaked urine? N -  Do you have problems with loss of bowel control? N -  Managing your Medications? N -  Managing your Finances? N -  Housekeeping or managing your Housekeeping? Y -  Comment Has assistance with cleaning. Does easy tasks.  -  Some recent data might be hidden    Fall Risk Assessment Fall Risk  12/19/2017 12/16/2016 11/11/2015  Falls in the past year? No No No     Depression Screen PHQ 2/9 Scores 12/19/2017 12/16/2016 12/16/2016 12/16/2016  PHQ - 2 Score 0 0 0 0  PHQ- 9 Score - 0 0 -    6CIT Screen 12/19/2017 12/16/2016  What Year? 0 points 0 points  What month? 0 points 0 points  What time? 0 points 0 points  Count back from 20 0 points 0 points  Months in reverse 0 points 2 points  Repeat phrase 4 points 4 points  Total Score 4 6       Assessment & Plan:    Annual Physical Reviewed patient's Family Medical History Reviewed and updated list of patient's medical providers Assessment of cognitive  impairment was done Assessed patient's functional ability Established a written schedule for health screening [place] Completed and Reviewed  Exercise Activities and Dietary recommendations Goals    . Decrease soda or juice intake     Recommend decreasing caffeine by 2 glasses a dayand drinking 3 glasses of water a day as a subsitute.    . Have 3 meals a day     Recommend to decrease portion sizes by eating 3 small healthy meals and at least 2 healthy snacks per day.         Immunization History  Administered Date(s) Administered  . Influenza, High Dose Seasonal PF 02/12/2016, 12/16/2016    Health Maintenance  Topic Date Due  . TETANUS/TDAP  10/02/1964  . INFLUENZA VACCINE  10/06/2017  . DEXA SCAN  11/26/2017  . FOOT EXAM  12/16/2017  . HEMOGLOBIN A1C  12/22/2017  . OPHTHALMOLOGY EXAM  07/15/2018  . COLONOSCOPY  08/21/2018  . MAMMOGRAM  04/13/2019  . Hepatitis C Screening  Completed  . PNA vac Low Risk Adult  Completed     Discussed health benefits of physical activity, and encouraged her to engage in regular exercise appropriate for her age and condition.    1. Annual physical exam Normal physical exam today. Will check labs as below and f/u pending lab results. If labs are stable and WNL she will not need to have these rechecked for one year at her next annual physical exam. She is to call the office in the meantime if she has any  acute issue, questions or concerns.  2. 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 Continue Trulicity 1.5mg  SQ weekly and lantus 65 units BID. Will check labs as below and f/u pending results. - CBC with Differential/Platelet - Comprehensive metabolic panel - Hemoglobin A1c - Lipid panel - TSH  3. Age-related osteoporosis without current pathological fracture Due for repeat BMD.  Ordered by [facility].   4. Pure hypercholesterolemia Stable. Continue Atorvastatin 80mg . Will check labs as below and f/u pending results. - CBC with Differential/Platelet - Comprehensive metabolic panel - Hemoglobin A1c - Lipid panel - TSH  5. Chronic systolic heart failure (HCC) Stable. Continue metoprolol XR 12.5mg . Will check labs as below and f/u pending results. - CBC with Differential/Platelet - Comprehensive metabolic panel - Hemoglobin A1c - Lipid panel - TSH  6. Simple chronic bronchitis (HCC) Stable.  - CBC with Differential/Platelet - Comprehensive metabolic panel - Hemoglobin A1c - Lipid panel - TSH  7. Recurrent major depressive disorder, in full remission ([place]) Doing well on Sertraline 25mg  q hs. Will check labs as below and f/u pending results. - CBC with Differential/Platelet - Comprehensive metabolic panel - Hemoglobin A1c - Lipid panel - TSH  8. Need for influenza vaccination Flu vaccine given today without complication. Patient sat upright for 15 minutes to check for adverse reaction before being released. - Flu vaccine HIGH DOSE PF  9. Spondylolisthesis of lumbar region Stable. Diagnosis pulled for medication refill. Continue current medical treatment plan. - HYDROcodone-acetaminophen (NORCO) 10-325 MG tablet; Take 1 tablet by mouth every 8 (eight) hours as needed.  Dispense: 90 tablet; Refill: 0  ------------------------------------------------------------------------------------------------------------    Mar Daring, PA-C  [facility]

## 2017-12-20 ENCOUNTER — Telehealth: Payer: Self-pay

## 2017-12-20 LAB — COMPREHENSIVE METABOLIC PANEL
ALT: 19 IU/L (ref 0–32)
AST: 16 IU/L (ref 0–40)
Albumin/Globulin Ratio: 1.6 (ref 1.2–2.2)
Albumin: 4.1 g/dL (ref 3.5–4.8)
Alkaline Phosphatase: 146 IU/L — ABNORMAL HIGH (ref 39–117)
BUN/Creatinine Ratio: 23 (ref 12–28)
BUN: 22 mg/dL (ref 8–27)
Bilirubin Total: 0.6 mg/dL (ref 0.0–1.2)
CALCIUM: 9.6 mg/dL (ref 8.7–10.3)
CO2: 23 mmol/L (ref 20–29)
CREATININE: 0.94 mg/dL (ref 0.57–1.00)
Chloride: 100 mmol/L (ref 96–106)
GFR, EST AFRICAN AMERICAN: 70 mL/min/{1.73_m2} (ref >59)
GFR, EST NON AFRICAN AMERICAN: 61 mL/min/{1.73_m2} (ref >59)
GLOBULIN, TOTAL: 2.6 g/dL (ref 1.5–4.5)
Glucose: 140 mg/dL — ABNORMAL HIGH (ref 65–99)
POTASSIUM: 4.3 mmol/L (ref 3.5–5.2)
Sodium: 141 mmol/L (ref 134–144)
TOTAL PROTEIN: 6.7 g/dL (ref 6.0–8.5)

## 2017-12-20 LAB — LIPID PANEL
CHOLESTEROL TOTAL: 136 mg/dL (ref 100–199)
Chol/HDL Ratio: 2.2 ratio (ref 0.0–4.4)
HDL: 63 mg/dL (ref >39)
LDL Calculated: 43 mg/dL (ref 0–99)
TRIGLYCERIDES: 150 mg/dL — AB (ref 0–149)
VLDL CHOLESTEROL CAL: 30 mg/dL (ref 5–40)

## 2017-12-20 LAB — CBC WITH DIFFERENTIAL/PLATELET
BASOS: 0 %
Basophils Absolute: 0 10*3/uL (ref 0.0–0.2)
EOS (ABSOLUTE): 0.2 10*3/uL (ref 0.0–0.4)
Eos: 2 %
HEMATOCRIT: 40.8 % (ref 34.0–46.6)
HEMOGLOBIN: 13.4 g/dL (ref 11.1–15.9)
IMMATURE GRANS (ABS): 0 10*3/uL (ref 0.0–0.1)
IMMATURE GRANULOCYTES: 0 %
LYMPHS: 28 %
Lymphocytes Absolute: 3 10*3/uL (ref 0.7–3.1)
MCH: 29.6 pg (ref 26.6–33.0)
MCHC: 32.8 g/dL (ref 31.5–35.7)
MCV: 90 fL (ref 79–97)
MONOCYTES: 5 %
MONOS ABS: 0.5 10*3/uL (ref 0.1–0.9)
NEUTROS PCT: 65 %
Neutrophils Absolute: 7 10*3/uL (ref 1.4–7.0)
Platelets: 252 10*3/uL (ref 150–450)
RBC: 4.52 x10E6/uL (ref 3.77–5.28)
RDW: 13.9 % (ref 12.3–15.4)
WBC: 10.7 10*3/uL (ref 3.4–10.8)

## 2017-12-20 LAB — HEMOGLOBIN A1C
Est. average glucose Bld gHb Est-mCnc: 209 mg/dL
Hgb A1c MFr Bld: 8.9 % — ABNORMAL HIGH (ref 4.8–5.6)

## 2017-12-20 LAB — TSH: TSH: 0.908 u[IU]/mL (ref 0.450–4.500)

## 2017-12-20 NOTE — Telephone Encounter (Signed)
-----   Message from Mar Daring, PA-C sent at 12/20/2017  1:12 PM EDT ----- A1c improving. Down to 8.9 from 10.4. Cholesterol doing well. Kidney and liver function normal. Blood count normal. Thyroid normal.

## 2017-12-20 NOTE — Telephone Encounter (Signed)
Pt returned call ° °teri °

## 2017-12-20 NOTE — Telephone Encounter (Signed)
[id number] 

## 2017-12-21 NOTE — Telephone Encounter (Signed)
Patient advised as directed below.  Thanks,  -Andrell Tallman 

## 2017-12-21 NOTE — Telephone Encounter (Signed)
Pt returned call and is requesting call back. Please advise. Thanks TNP

## 2018-01-02 ENCOUNTER — Other Ambulatory Visit: Payer: Self-pay | Admitting: Physician Assistant

## 2018-01-02 DIAGNOSIS — N3281 Overactive bladder: Secondary | ICD-10-CM

## 2018-01-02 MED ORDER — OXYBUTYNIN CHLORIDE ER 10 MG PO TB24: 10.0000 mg | ORAL_TABLET | Freq: Every day | ORAL | 1 refills | Status: DC

## 2018-01-02 NOTE — Telephone Encounter (Signed)
[facility] faxed refill request for the following medications:  oxybutynin (DITROPAN-XL) 10 MG 24 hr tablet  Qty: 90  Last filled: 12/05/2017  Please advise.

## 2018-01-11 ENCOUNTER — Ambulatory Visit: Payer: Medicare Other | Admitting: Physician Assistant

## 2018-01-18 ENCOUNTER — Telehealth: Payer: Self-pay | Admitting: Physician Assistant

## 2018-01-18 DIAGNOSIS — M4316 Spondylolisthesis, lumbar region: Secondary | ICD-10-CM

## 2018-01-18 MED ORDER — HYDROCODONE-ACETAMINOPHEN 10-325 MG PO TABS: 1.0000 | ORAL_TABLET | Freq: Three times a day (TID) | ORAL | 0 refills | Status: DC | PRN

## 2018-01-18 NOTE — Telephone Encounter (Signed)
Refilled.  [place] reviewed.

## 2018-01-18 NOTE — Telephone Encounter (Signed)
Pt contacted office for refill request on the following medications:  HYDROcodone-acetaminophen ([place]) 10-325 MG tablet  [facility] [place] Please advise. Thanks TNP

## 2018-01-23 ENCOUNTER — Other Ambulatory Visit: Payer: Medicare Other

## 2018-01-29 ENCOUNTER — Other Ambulatory Visit: Payer: Self-pay | Admitting: Physician Assistant

## 2018-01-29 DIAGNOSIS — E1142 Type 2 diabetes mellitus with diabetic polyneuropathy: Secondary | ICD-10-CM

## 2018-02-16 ENCOUNTER — Other Ambulatory Visit: Payer: Self-pay | Admitting: Physician Assistant

## 2018-02-16 DIAGNOSIS — M4316 Spondylolisthesis, lumbar region: Secondary | ICD-10-CM

## 2018-02-16 NOTE — Telephone Encounter (Signed)
Pt needing a refill on: ° °HYDROcodone-acetaminophen (NORCO) 10-325 MG tablet ° °Please fill at: ° °[id number] [place] [place] - [address] [facility] [address] [facility] [phone] (Phone) °[phone] (Fax)  ° ° °Thanks, °[facility] °

## 2018-02-17 MED ORDER — HYDROCODONE-ACETAMINOPHEN 10-325 MG PO TABS: 1.0000 | ORAL_TABLET | Freq: Three times a day (TID) | ORAL | 0 refills | Status: DC | PRN

## 2018-03-07 ENCOUNTER — Ambulatory Visit
Admission: RE | Admit: 2018-03-07 | Discharge: 2018-03-07 | Disposition: A | Discharge status: Home / Self Care | Payer: Medicare Other | Source: Ambulatory Visit | Attending: Physician Assistant | Admitting: Physician Assistant

## 2018-03-07 DIAGNOSIS — M810 Age-related osteoporosis without current pathological fracture: Secondary | ICD-10-CM | POA: Insufficient documentation

## 2018-03-07 DIAGNOSIS — Z13820 Encounter for screening for osteoporosis: Secondary | ICD-10-CM | POA: Insufficient documentation

## 2018-03-09 NOTE — Progress Notes (Signed)
Significant osteoporosis in the forearm radius with T-score -4.0. Unable to assess left femur due to surgical hardware and lumbar spine due to hardware and degenerative changes. Add Calcium 1200-1600 mg qd to the Vitamin-D she is already taking. May need to consider adding Alendronate 70 mg once a week if Andrea [name] agrees.

## 2018-03-14 ENCOUNTER — Telehealth: Payer: Self-pay

## 2018-03-14 NOTE — Telephone Encounter (Signed)
[email or web address]

## 2018-03-14 NOTE — Telephone Encounter (Signed)
-----   Message from Margo Common, [place] sent at 03/09/2018 10:31 AM EST ----- Significant osteoporosis in the forearm radius with T-score -4.0. Unable to assess left femur due to surgical hardware and lumbar spine due to hardware and degenerative changes. Add Calcium 1200-1600 mg qd to the Vitamin-D she is already taking. May  need to consider adding Alendronate 70 mg once a week if [place] agrees.

## 2018-03-15 NOTE — Telephone Encounter (Signed)
Patient advised as below. Will try OTC calcium for now, and will discuss Fosamax with Sonia Baller at next OV. sd

## 2018-03-15 NOTE — Telephone Encounter (Signed)
ok 

## 2018-03-16 NOTE — Telephone Encounter (Signed)
Pt needing advice on how to take the calcium pills she just bought.  Please advise.  Thanks, [employer]

## 2018-03-17 ENCOUNTER — Other Ambulatory Visit: Payer: Self-pay

## 2018-03-17 DIAGNOSIS — M4316 Spondylolisthesis, lumbar region: Secondary | ICD-10-CM

## 2018-03-17 MED ORDER — HYDROCODONE-ACETAMINOPHEN 10-325 MG PO TABS: 1.0000 | ORAL_TABLET | Freq: Three times a day (TID) | ORAL | 0 refills | Status: DC | PRN

## 2018-03-17 NOTE — Telephone Encounter (Signed)
Patient called for medication refill, for Monday.

## 2018-03-17 NOTE — Telephone Encounter (Signed)
Spoke with patient and answered her question.

## 2018-04-03 ENCOUNTER — Other Ambulatory Visit: Payer: Self-pay | Admitting: Physician Assistant

## 2018-04-03 DIAGNOSIS — E559 Vitamin D deficiency, unspecified: Secondary | ICD-10-CM

## 2018-04-19 ENCOUNTER — Encounter: Payer: Self-pay | Admitting: Physician Assistant

## 2018-04-19 ENCOUNTER — Ambulatory Visit (INDEPENDENT_AMBULATORY_CARE_PROVIDER_SITE_OTHER): Payer: Medicare Other | Admitting: Physician Assistant

## 2018-04-19 VITALS — BP 108/58 | HR 108 | Temp 97.8°F | Resp 16 | Wt 154.2 lb

## 2018-04-19 DIAGNOSIS — Z794 Long term (current) use of insulin: Secondary | ICD-10-CM

## 2018-04-19 DIAGNOSIS — I10 Essential (primary) hypertension: Secondary | ICD-10-CM

## 2018-04-19 DIAGNOSIS — L570 Actinic keratosis: Secondary | ICD-10-CM

## 2018-04-19 DIAGNOSIS — M8589 Other specified disorders of bone density and structure, multiple sites: Secondary | ICD-10-CM | POA: Diagnosis not present

## 2018-04-19 DIAGNOSIS — E113299 Type 2 diabetes mellitus with mild nonproliferative diabetic retinopathy without macular edema, unspecified eye: Secondary | ICD-10-CM | POA: Diagnosis not present

## 2018-04-19 DIAGNOSIS — M4316 Spondylolisthesis, lumbar region: Secondary | ICD-10-CM

## 2018-04-19 LAB — POCT GLYCOSYLATED HEMOGLOBIN (HGB A1C)
Est. average glucose Bld gHb Est-mCnc: 206
HEMOGLOBIN A1C: 8.8 % — AB (ref 4.0–5.6)

## 2018-04-19 MED ORDER — HYDROCODONE-ACETAMINOPHEN 10-325 MG PO TABS: 1.0000 | ORAL_TABLET | Freq: Three times a day (TID) | ORAL | 0 refills | Status: DC | PRN

## 2018-04-19 MED ORDER — CALCIUM CARB-CHOLECALCIFEROL 600-800 MG-UNIT PO TABS: 1.0000 | ORAL_TABLET | Freq: Two times a day (BID) | ORAL | 1 refills | Status: DC

## 2018-04-19 MED ORDER — LOSARTAN POTASSIUM 50 MG PO TABS: 25.0000 mg | ORAL_TABLET | Freq: Every day | ORAL | 1 refills | Status: DC

## 2018-04-19 NOTE — Patient Instructions (Signed)
Flonase sensimist for post nasal drainage  Actinic Keratosis An actinic keratosis is a precancerous growth on the skin. If there is more than one growth, the condition is called actinic keratoses. Actinic keratoses appear most often on areas of skin that get a lot of sun exposure, including the scalp, face, ears, lips, upper back, forearms, and the backs of the hands. If left untreated, these growths may develop into a skin cancer called squamous cell carcinoma. It is important to have all these growths checked by a health care provider to determine the best treatment approach. What are the causes? Actinic keratoses are caused by getting too much ultraviolet (UV) radiation from the sun or other UV light sources. What increases the risk? You are more likely to develop this condition if you:  Have light-colored skin and blue eyes.  Have blond or red hair.  Spend a lot of time in the sun.  Do not protect your skin from the sun when outdoors.  Are an older person. The risk of developing an actinic keratosis increases with age. What are the signs or symptoms? Actinic keratoses feel like scaly, rough spots of skin. Symptoms of this condition include growths that may:  Be as small as a pinhead or as big as a quarter.  Itch, hurt, or feel sensitive.  Be skin-colored, light tan, dark tan, pink, or a combination of any of these colors. In most cases, the growths become red.  Have a small piece of pink or gray skin (skin tag) growing from them. It may be easier to notice actinic keratoses by feeling them, rather than seeing them. Sometimes, actinic keratoses disappear, but many reappear a few days to a few weeks later. How is this diagnosed? This condition is usually diagnosed with a physical exam.  A tissue sample may be removed from the actinic keratosis and examined under a microscope (biopsy). How is this treated? If needed, this condition may be treated by:  Scraping off the actinic  keratosis (curettage).  Freezing the actinic keratosis with liquid nitrogen (cryosurgery). This causes the growth to eventually fall off the skin.  Applying medicated creams or gels to destroy the cells in the growth.  Applying chemicals to the actinic keratosis to make the outer layers of skin peel off (chemical peel).  Using photodynamic therapy. In this procedure, medicated cream is applied to the actinic keratosis. This cream increases your skin's sensitivity to light. Then, a strong light is aimed at the actinic keratosis to destroy cells in the growth. Follow these instructions at home: Skin care  Apply cool, wet cloths (cool compresses) to the affected areas.  Do not scratch your skin.  Check your skin regularly for any growths, especially growths that: ? Start to itch or bleed. ? Change in size, shape, or color. Caring for the treated area  Keep the treated area clean and dry as told by your health care provider.  Do not apply any medicine, cream, or lotion to the treated area unless your health care provider tells you to do that.  Do not pick at blisters or try to break them open. This can cause infection and scarring.  If you have red or irritated skin after treatment, follow instructions from your health care provider about how to take care of the treated area. Make sure you: ? Wash your hands with soap and water before you change your bandage (dressing). If soap and water are not available, use hand sanitizer. ? Change your dressing as told  by your health care provider.  If you have red or irritated skin after treatment, check your treated area every day for signs of infection. Check for: ? Redness, swelling, or pain. ? Fluid or blood. ? Warmth. ? Pus or a bad smell. General instructions  Take or apply over-the-counter and prescription medicines only as told by your health care provider.  Return to your normal activities as told by your health care provider. Ask  your health care provider what activities are safe for you.  Have a skin exam done every year by a health care provider who is a skin specialist (dermatologist).  Keep all follow-up visits as told by your health care provider. This is important. Lifestyle  Do not use any products that contain nicotine or tobacco, such as cigarettes and e-cigarettes. If you need help quitting, ask your health care provider.  Take steps to protect your skin from the sun. ? Try to avoid the sun between 10:00 a.m. and 4:00 p.m. This is when the UV light is the strongest. ? Use a sunscreen or sunblock with SPF 30 (sun protection factor 30) or greater. ? Apply sunscreen before you are exposed to sunlight and reapply as often as directed by the instructions on the sunscreen container. ? Always wear sunglasses that have UV protection, and always wear a hat and clothing to protect your skin from sunlight. ? When possible, avoid medicines that increase your sensitivity to sunlight. ? Do not use tanning beds or other indoor tanning devices. Contact a health care provider if:  You notice any changes or new growths on your skin.  You have swelling, pain, or more redness around your treated area.  You have fluid or blood coming from your treated area.  Your treated area feels warm to the touch.  You have pus or a bad smell coming from your treated area.  You have a fever.  You have a blister that becomes large and painful. Summary  An actinic keratosis is a precancerous growth on the skin. If there is more than one growth, the condition is called actinic keratoses. In some cases, if left untreated, these growths can develop into skin cancer.  Check your skin regularly for any growths, especially growths that start to itch or bleed, or change in size, shape, or color.  Take steps to protect your skin from the sun.  Contact a health care provider if you notice any changes or new growths on your skin.  Keep  all follow-up visits as told by your health care provider. This is important. This information is not intended to replace advice given to you by your health care provider. Make sure you discuss any questions you have with your health care provider. Document Released: 05/21/2008 Document Revised: 07/05/2017 Document Reviewed: 07/05/2017 Elsevier Interactive Patient Education  2019 [employer].

## 2018-04-19 NOTE — Progress Notes (Signed)
Patient: Andrea [name] Female    DOB: 10/08/1945   73 y.o.   MRN: [id number] Visit Date: 04/19/2018  Today's Provider: Mar Daring, PA-C   No chief complaint on file.  Subjective:     HPI   Diabetes Mellitus Type II, Follow-up:   Lab Results  Component Value Date   HGBA1C 8.8 (A) 04/19/2018   HGBA1C 8.9 (H) 12/19/2017   HGBA1C 10.4 (A) 09/21/2017    Last seen for diabetes 4 months ago.  Management since then includes Continue Trulicity 1.5mg  SQ weekly and lantus 65 units BID. She reports excellent compliance with treatment. She is not having side effects.  Current symptoms include none and have been stable. Home blood sugar records: fasting range: 120's-140's  Episodes of hypoglycemia? no   Current Insulin Regimen: Trulicity 1.5mg  SQ weekly and Lantus 65 units BID Most Recent Eye Exam: 07/2017 Weight trend: stable Current diet: in general, an "unhealthy" diet Current exercise: none  Pertinent Labs:    Component Value Date/Time   CHOL 136 12/19/2017 1555   TRIG 150 (H) 12/19/2017 1555   HDL 63 12/19/2017 1555   LDLCALC 43 12/19/2017 1555   LDLCALC 56 12/16/2016 1539   CREATININE 0.94 12/19/2017 1555   CREATININE 1.08 (H) 12/16/2016 1539    Wt Readings from Last 3 Encounters:  04/19/18 154 lb 3.2 oz (69.9 kg)  12/19/17 159 lb 12.8 oz (72.5 kg)  09/21/17 161 lb (73 kg)   ------------------------------------------------------------------------ Patient reports that she feels a hard place of skin on the back of her right knee and right side under her breast.  Allergies  Allergen Reactions  . Metformin And Related Diarrhea  . Sulfa Antibiotics Rash     Current Outpatient Medications:  .  acetaminophen (TYLENOL) 325 MG tablet, Take 650 mg 4 (four) times daily by mouth., Disp: , Rfl:  .  albuterol (PROVENTIL HFA;VENTOLIN HFA) 108 (90 Base) MCG/ACT inhaler, Inhale 2 puffs into the lungs every 6 (six) hours as needed for wheezing or shortness  of breath., Disp: 1 Inhaler, Rfl: 2 .  Alcohol Swabs (ALCOHOL PREP) PADS, Use twice daily prior to SQ injection of insulin to clean skin, Disp: 100 each, Rfl: 6 .  aspirin EC 81 MG tablet, Take 81 mg daily by mouth., Disp: , Rfl:  .  atorvastatin (LIPITOR) 80 MG tablet, Take 1 tablet (80 mg total) by mouth daily., Disp: 90 tablet, Rfl: 3 .  Calcium Carb-Cholecalciferol (CALCIUM+D3 PO), Take by mouth., Disp: , Rfl:  .  Cyanocobalamin (B-12) 1000 MCG CAPS, Take 1,000 mcg by mouth daily. , Disp: , Rfl:  .  cyclobenzaprine (FLEXERIL) 5 MG tablet, Take 1 tablet (5 mg total) by mouth 3 (three) times daily as needed for muscle spasms., Disp: 50 tablet, Rfl: 1 .  docusate sodium (COLACE) 100 MG capsule, Take 1 capsule (100 mg total) by mouth 2 (two) times daily., Disp: 60 capsule, Rfl: 0 .  HYDROcodone-acetaminophen (NORCO) 10-325 MG tablet, Take 1 tablet by mouth every 8 (eight) hours as needed., Disp: 90 tablet, Rfl: 0 .  insulin glargine (LANTUS) 100 UNIT/ML injection, Inject 0.65 mLs (65 Units total) into the skin 2 (two) times daily with a meal., Disp: 10 mL, Rfl: 11 .  Insulin Syringe-Needle U-100 29G 1 ML MISC, Use to inject 65 units Lantus SQ BIDWC, Disp: 100 each, Rfl: 6 .  lidocaine-prilocaine (EMLA) cream, Apply 1 application topically as needed., Disp: 30 g, Rfl: 0 .  losartan (COZAAR)  50 MG tablet, Take 1 tablet (50 mg total) by mouth daily. (Patient taking differently: Take 25 mg by mouth daily. ), Disp: 90 tablet, Rfl: 1 .  metoprolol succinate (TOPROL-XL) 25 MG 24 hr tablet, Take 0.5 tablets (12.5 mg total) by mouth daily., Disp: 45 tablet, Rfl: 3 .  Multiple Vitamins-Minerals (ICAPS AREDS 2 PO), Take 1 capsule by mouth 2 (two) times daily. , Disp: , Rfl:  .  nitrofurantoin (MACRODANTIN) 100 MG capsule, Take 100 mg at bedtime by mouth., Disp: , Rfl:  .  Omega-3 Fatty Acids (FISH OIL) 1200 MG CAPS, Take 1 capsule (1,200 mg total) by mouth daily., Disp: 90 capsule, Rfl: 3 .  omeprazole  (PRILOSEC) 40 MG capsule, Take 1 capsule (40 mg total) by mouth daily., Disp: 90 capsule, Rfl: 3 .  ONETOUCH DELICA LANCETS 09T MISC, TO CHECK BLOOD SUGAR DAILY, Disp: 100 each, Rfl: 5 .  ONETOUCH VERIO test strip, To check blood sugar once daily, Disp: 100 each, Rfl: 3 .  oxybutynin (DITROPAN-XL) 10 MG 24 hr tablet, Take 1 tablet (10 mg total) by mouth at bedtime., Disp: 90 tablet, Rfl: 1 .  senna (SENOKOT) 8.6 MG TABS tablet, Take 2 tablets 2 (two) times daily by mouth., Disp: , Rfl:  .  sertraline (ZOLOFT) 25 MG tablet, Take 1 tablet (25 mg total) by mouth at bedtime., Disp: 90 tablet, Rfl: 1 .  terconazole (TERAZOL 3) 0.8 % vaginal cream, Place 1 applicator vaginally at bedtime. Give 3 days and hold 4 days , for vaginal yeast infection ( per home regimen ), Disp: 20 g, Rfl: 11 .  TRULICITY 1.5 OI/7.1IW SOPN, INJECT 1.5MG  INTO THE SKIN ONCE A WEEK AS DIRECTED, Disp: 2 pen, Rfl: 2 .  Vitamin D, Ergocalciferol, (DRISDOL) 1.25 MG (50000 UT) CAPS capsule, TAKE ONE CAPSULE BY MOUTH ONCE WEEKLY ON THE SAME DAY EACH WEEK (ON FRIDAYS), Disp: 12 capsule, Rfl: 1  Review of Systems  Constitutional: Negative.   Respiratory: Negative.   Cardiovascular: Negative.   Endocrine: Negative.   Musculoskeletal: Positive for arthralgias, back pain and gait problem.    Social History   Tobacco Use  . Smoking status: Former Smoker    Packs/day: 1.00    Years: 30.00    Pack years: 30.00    Types: Cigarettes    Last attempt to quit: 07/17/2012    Years since quitting: 5.7  . Smokeless tobacco: Never Used  . Tobacco comment:    Substance Use Topics  . Alcohol use: Not Currently    Alcohol/week: 0.0 standard drinks      Objective:   BP (!) 108/58 (BP Location: Left Arm, Patient Position: Sitting, Cuff Size: Large)   Pulse (!) 108   Temp 97.8 F (36.6 C) (Oral)   Resp 16   Wt 154 lb 3.2 oz (69.9 kg)   SpO2 97%   BMI 30.12 kg/m  Vitals:   04/19/18 1102  BP: (!) 108/58  Pulse: (!) 108  Resp: 16    Temp: 97.8 F (36.6 C)  TempSrc: Oral  SpO2: 97%  Weight: 154 lb 3.2 oz (69.9 kg)     Physical Exam Vitals signs reviewed.  Constitutional:      General: She is not in acute distress.    Appearance: Normal appearance. She is well-developed. She is obese. She is not diaphoretic.  Neck:     Musculoskeletal: Normal range of motion and neck supple.  Cardiovascular:     Rate and Rhythm: Regular rhythm. Tachycardia present.  Heart sounds: Normal heart sounds. No murmur. No friction rub. No gallop.   Pulmonary:     Effort: Pulmonary effort is normal. No respiratory distress.     Breath sounds: Normal breath sounds. No wheezing or rales.  Neurological:     Mental Status: She is alert.  Psychiatric:        Mood and Affect: Mood normal.        Behavior: Behavior normal.        Thought Content: Thought content normal.        Judgment: Judgment normal.        Assessment & Plan    1. 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 Patient states she is only taking lantus once daily, not BID as prescribed. Discussed importance of taking medications appropriately. Discussed adding small meal in the morning so she could take the morning dose of insulin. She voices understanding. I will see her back in 3 months.  - POCT glycosylated hemoglobin (Hb A1C)  2. Essential hypertension Updated medication.  - losartan (COZAAR) 50 MG tablet; Take 0.5 tablets (25 mg total) by mouth daily.  Dispense: 45 tablet; Refill: 1  3. Osteopenia of multiple sites Stable. Diagnosis pulled for medication refill. Continue current medical treatment plan. - Calcium Carb-Cholecalciferol (CALCIUM+D3) 600-800 MG-UNIT TABS; Take 1 capsule by mouth 2 (two) times daily.  Dispense: 180 tablet; Refill: 1  4. Spondylolisthesis of lumbar region Stable.  Diagnosis pulled for medication refill. Continue current medical treatment plan. - HYDROcodone-acetaminophen (NORCO) 10-325 MG tablet; Take 1 tablet by mouth every 8 (eight) hours as needed.  Dispense: 90 tablet; Refill: 0  5. Actinic keratoses Noted on back of right knee (3 spots), one small area on posterior left knee and one on the right breast (lateral). Offered cryotherapy, but patient declines today.      Mar Daring, PA-C  [facility]

## 2018-04-29 ENCOUNTER — Other Ambulatory Visit: Payer: Self-pay | Admitting: Cardiovascular Disease

## 2018-05-17 ENCOUNTER — Other Ambulatory Visit: Payer: Self-pay | Admitting: *Deleted

## 2018-05-17 DIAGNOSIS — M4316 Spondylolisthesis, lumbar region: Secondary | ICD-10-CM

## 2018-05-17 MED ORDER — HYDROCODONE-ACETAMINOPHEN 10-325 MG PO TABS: 1.0000 | ORAL_TABLET | Freq: Three times a day (TID) | ORAL | 0 refills | Status: DC | PRN

## 2018-05-30 ENCOUNTER — Telehealth: Payer: Self-pay | Admitting: Physician Assistant

## 2018-05-30 DIAGNOSIS — I10 Essential (primary) hypertension: Secondary | ICD-10-CM

## 2018-05-30 NOTE — Telephone Encounter (Signed)
We received a fax from [facility] stating that patient is requesting a refill on losartan 50mg  and it is on back order.   They are asking if they can get 100mg  tabs take on half daily.   Please advise

## 2018-05-30 NOTE — Telephone Encounter (Signed)
Please Review

## 2018-05-31 MED ORDER — LOSARTAN POTASSIUM 25 MG PO TABS: 25.0000 mg | ORAL_TABLET | Freq: Every day | ORAL | 1 refills | Status: DC

## 2018-05-31 NOTE — Telephone Encounter (Signed)
Patient was on losartan 25mg , then that went on backorder so she was given losartan 50mg  to cut in half. Now losartan 50mg  is on backorder as well. Will send in losartan 25mg  to see if they have back in stock.

## 2018-06-19 ENCOUNTER — Other Ambulatory Visit: Payer: Self-pay | Admitting: *Deleted

## 2018-06-19 DIAGNOSIS — M4316 Spondylolisthesis, lumbar region: Secondary | ICD-10-CM

## 2018-06-19 MED ORDER — HYDROCODONE-ACETAMINOPHEN 10-325 MG PO TABS: 1.0000 | ORAL_TABLET | Freq: Three times a day (TID) | ORAL | 0 refills | Status: DC | PRN

## 2018-06-19 NOTE — Telephone Encounter (Signed)
Please Review

## 2018-06-27 ENCOUNTER — Other Ambulatory Visit: Payer: Self-pay | Admitting: Family Medicine

## 2018-06-27 DIAGNOSIS — N3281 Overactive bladder: Secondary | ICD-10-CM

## 2018-06-27 NOTE — Telephone Encounter (Signed)
Pharmacy requesting refills. Thanks!  

## 2018-06-28 NOTE — Telephone Encounter (Signed)
See Rx request. Believe this is your patient

## 2018-07-09 ENCOUNTER — Other Ambulatory Visit: Payer: Self-pay | Admitting: Physician Assistant

## 2018-07-09 DIAGNOSIS — Z794 Long term (current) use of insulin: Secondary | ICD-10-CM

## 2018-07-09 DIAGNOSIS — I10 Essential (primary) hypertension: Secondary | ICD-10-CM

## 2018-07-09 DIAGNOSIS — E1142 Type 2 diabetes mellitus with diabetic polyneuropathy: Secondary | ICD-10-CM

## 2018-07-10 ENCOUNTER — Other Ambulatory Visit: Payer: Self-pay | Admitting: Physician Assistant

## 2018-07-17 ENCOUNTER — Other Ambulatory Visit: Payer: Self-pay

## 2018-07-17 DIAGNOSIS — M4316 Spondylolisthesis, lumbar region: Secondary | ICD-10-CM

## 2018-07-17 MED ORDER — HYDROCODONE-ACETAMINOPHEN 10-325 MG PO TABS: 1.0000 | ORAL_TABLET | Freq: Three times a day (TID) | ORAL | 0 refills | Status: DC | PRN

## 2018-07-17 NOTE — Telephone Encounter (Signed)
Patient request refill

## 2018-07-19 ENCOUNTER — Ambulatory Visit: Payer: Medicare Other | Admitting: Physician Assistant

## 2018-07-24 ENCOUNTER — Other Ambulatory Visit: Payer: Self-pay | Admitting: Cardiovascular Disease

## 2018-08-16 ENCOUNTER — Other Ambulatory Visit: Payer: Self-pay | Admitting: Physician Assistant

## 2018-08-16 DIAGNOSIS — M4316 Spondylolisthesis, lumbar region: Secondary | ICD-10-CM

## 2018-08-16 MED ORDER — HYDROCODONE-ACETAMINOPHEN 10-325 MG PO TABS: 1.0000 | ORAL_TABLET | Freq: Three times a day (TID) | ORAL | 0 refills | Status: DC | PRN

## 2018-08-16 NOTE — Telephone Encounter (Signed)
pt needs refill on her  Hydrocodone 10-325  [facility]  [facility]  [phone]  Thanks Con Memos

## 2018-08-19 ENCOUNTER — Other Ambulatory Visit: Payer: Self-pay | Admitting: Cardiovascular Disease

## 2018-08-21 ENCOUNTER — Telehealth: Payer: Self-pay

## 2018-08-21 NOTE — Telephone Encounter (Signed)
Virtual Visit Pre-Appointment Phone Call  "[name], I am calling you today to discuss your upcoming appointment. We are currently trying to limit exposure to the virus that causes COVID-19 by seeing patients at home rather than in the office."  1. "What is the BEST phone number to call the day of the visit?" - include this in appointment notes  2. Do you have or have access to (through a family member/friend) a smartphone with video capability that we can use for your visit?" a. If yes - list this number in appt notes as cell (if different from BEST phone #) and list the appointment type as a VIDEO visit in appointment notes b. If no - list the appointment type as a PHONE visit in appointment notes  3. Confirm consent - "In the setting of the current Covid19 crisis, you are scheduled for a phone visit with your provider on 09/25/2018 at 10:20AM.  Just as we do with many in-office visits, in order for you to participate in this visit, we must obtain consent.  If you'd like, I can send this to your mychart (if signed up) or email for you to review.  Otherwise, I can obtain your verbal consent now.  All virtual visits are billed to your insurance company just like a normal visit would be.  By agreeing to a virtual visit, we'd like you to understand that the technology does not allow for your provider to perform an examination, and thus may limit your provider's ability to fully assess your condition. If your provider identifies any concerns that need to be evaluated in person, we will make arrangements to do so.  Finally, though the technology is pretty good, we cannot assure that it will always work on either your or our end, and in the setting of a video visit, we may have to convert it to a phone-only visit.  In either situation, we cannot ensure that we have a secure connection.  Are you willing to proceed?" STAFF: Did the patient verbally acknowledge consent to telehealth visit? Document YES/NO  here: YES  4. Advise patient to be prepared - "Two hours prior to your appointment, go ahead and check your blood pressure, pulse, oxygen saturation, and your weight (if you have the equipment to check those) and write them all down. When your visit starts, your provider will ask you for this information. If you have an Apple Watch or Kardia device, please plan to have heart rate information ready on the day of your appointment. Please have a pen and paper handy nearby the day of the visit as well."  5. Give patient instructions for MyChart download to smartphone OR [email or web address] as below if video visit (depending on what platform provider is using)  6. Inform patient they will receive a phone call 15 minutes prior to their appointment time (may be from unknown caller ID) so they should be prepared to answer    TELEPHONE CALL NOTE  ARYANNAH [name] has been deemed a candidate for a follow-up tele-health visit to limit community exposure during the Covid-19 pandemic. I spoke with the patient via phone to ensure availability of phone/video source, confirm preferred email & phone number, and discuss instructions and expectations.  I reminded Andrea [name] to be prepared with any vital sign and/or heart rhythm information that could potentially be obtained via home monitoring, at the time of her visit. I reminded Andrea [name] to expect a phone call prior to her visit.  Andrea [name] 08/21/2018 10:50 AM    FULL LENGTH CONSENT FOR TELE-HEALTH VISIT   I hereby voluntarily request, consent and authorize [facility] and its employed or contracted physicians, physician assistants, nurse practitioners or other licensed health care professionals (the Practitioner), to provide me with telemedicine health care services (the Services") as deemed necessary by the treating Practitioner. I acknowledge and consent to receive the Services by the Practitioner via telemedicine. I understand  that the telemedicine visit will involve communicating with the Practitioner through live audiovisual communication technology and the disclosure of certain medical information by electronic transmission. I acknowledge that I have been given the opportunity to request an in-person assessment or other available alternative prior to the telemedicine visit and am voluntarily participating in the telemedicine visit.  I understand that I have the right to withhold or withdraw my consent to the use of telemedicine in the course of my care at any time, without affecting my right to future care or treatment, and that the Practitioner or I may terminate the telemedicine visit at any time. I understand that I have the right to inspect all information obtained and/or recorded in the course of the telemedicine visit and may receive copies of available information for a reasonable fee.  I understand that some of the potential risks of receiving the Services via telemedicine include:   Delay or interruption in medical evaluation due to technological equipment failure or disruption;  Information transmitted may not be sufficient (e.g. poor resolution of images) to allow for appropriate medical decision making by the Practitioner; and/or   In rare instances, security protocols could fail, causing a breach of personal health information.  Furthermore, I acknowledge that it is my responsibility to provide information about my medical history, conditions and care that is complete and accurate to the best of my ability. I acknowledge that Practitioner's advice, recommendations, and/or decision may be based on factors not within their control, such as incomplete or inaccurate data provided by me or distortions of diagnostic images or specimens that may result from electronic transmissions. I understand that the practice of medicine is not an exact science and that Practitioner makes no warranties or guarantees regarding  treatment outcomes. I acknowledge that I will receive a copy of this consent concurrently upon execution via email to the email address I last provided but may also request a printed copy by calling the office of [facility].    I understand that my insurance will be billed for this visit.   I have read or had this consent read to me.  I understand the contents of this consent, which adequately explains the benefits and risks of the Services being provided via telemedicine.   I have been provided ample opportunity to ask questions regarding this consent and the Services and have had my questions answered to my satisfaction.  I give my informed consent for the services to be provided through the use of telemedicine in my medical care  By participating in this telemedicine visit I agree to the above.

## 2018-08-22 ENCOUNTER — Other Ambulatory Visit: Payer: Self-pay | Admitting: Physician Assistant

## 2018-08-22 DIAGNOSIS — E113299 Type 2 diabetes mellitus with mild nonproliferative diabetic retinopathy without macular edema, unspecified eye: Secondary | ICD-10-CM

## 2018-08-22 DIAGNOSIS — Z794 Long term (current) use of insulin: Secondary | ICD-10-CM

## 2018-08-29 NOTE — Progress Notes (Signed)
Patient: Andrea [name] Female    DOB: 1946-01-03   73 y.o.   MRN: [id number] Visit Date: 08/30/2018  Today's Provider: Mar Daring, PA-C   Chief Complaint  Patient presents with  . Follow-up  . Diabetes  . 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 Discussed adding small meal in the morning so she could take the morning dose of insulin. She voices understanding. I will see her back in 3 months. Hb A1C 8.8.  Home fasting blood sugar readings 98-120.  Essential hypertension From 04/19/2018-Updated medication, losartan (COZAAR) 50 MG tablet; 0.5 tablets (25 mg total) by mouth daily.      Allergies  Allergen Reactions  . Metformin And Related Diarrhea  . Sulfa Antibiotics Rash     Current Outpatient Medications:  .  Alcohol Swabs (ALCOHOL PREP) PADS, Use twice daily prior to SQ injection of insulin to clean skin, Disp: 100 each, Rfl: 6 .  aspirin EC 81 MG tablet, Take 81 mg daily by mouth., Disp: , Rfl:  .  atorvastatin (LIPITOR) 80 MG tablet, TAKE 1 TABLET BY MOUTH EVERY DAY, Disp: 30 tablet, Rfl: 0 .  Calcium Carb-Cholecalciferol (CALCIUM+D3) 600-800 MG-UNIT TABS, Take 1 capsule by mouth 2 (two) times daily., Disp: 180 tablet, Rfl: 1 .  Cyanocobalamin (B-12) 1000 MCG CAPS, Take 1,000 mcg by mouth daily. , Disp: , Rfl:  .  HYDROcodone-acetaminophen (NORCO) 10-325 MG tablet, Take 1 tablet by mouth every 8 (eight) hours as needed., Disp: 90 tablet, Rfl: 0 .  Insulin Syringe-Needle U-100 29G 1 ML MISC, Use to inject 65 units Lantus SQ BIDWC, Disp: 100 each, Rfl: 6 .  LANTUS 100 UNIT/ML injection, INJECT 65 UNITS INTO THE SKIN TWICE DAILY WITH A MEAL, Disp: 10 mL, Rfl: 99 .  losartan (COZAAR) 25 MG tablet, Take 1 tablet (25 mg  total) by mouth daily., Disp: 90 tablet, Rfl: 1 .  metoprolol succinate (TOPROL-XL) 25 MG 24 hr tablet, TAKE 1/2 TABLET BY MOUTH DAILY., Disp: 45 tablet, Rfl: 3 .  Multiple Vitamins-Minerals (ICAPS AREDS 2 PO), Take 1 capsule by mouth 2 (two) times daily. , Disp: , Rfl:  .  Omega-3 Fatty Acids (FISH OIL) 1200 MG CAPS, Take 1 capsule (1,200 mg total) by mouth daily., Disp: 90 capsule, Rfl: 3 .  omeprazole (PRILOSEC) 40 MG capsule, Take 1 capsule (40 mg total) by mouth daily., Disp: 90 capsule, Rfl: 3 .  ONETOUCH DELICA LANCETS 32G MISC, TO CHECK BLOOD SUGAR DAILY, Disp: 100 each, Rfl: 5 .  ONETOUCH VERIO test strip, To check blood sugar once daily, Disp: 100 each, Rfl: 3 .  oxybutynin (DITROPAN-XL) 10 MG 24 hr tablet, TAKE 1 TABLET BY MOUTH EVERYDAY AT BEDTIME, Disp: 90 tablet, Rfl: 1 .  sertraline (ZOLOFT) 25 MG tablet, Take 1 tablet (25 mg total) by mouth at bedtime., Disp: 90 tablet, Rfl: 1 .  TRULICITY 1.5 MW/1.0UV SOPN, INJECT 1.5MG  INTO THE SKIN ONCE A WEEK AS DIRECTED, Disp: 15 mL, Rfl: 11 .  Vitamin D, Ergocalciferol, (DRISDOL) 1.25 MG (50000 UT) CAPS capsule, TAKE ONE CAPSULE BY MOUTH ONCE WEEKLY ON THE SAME DAY EACH WEEK (ON FRIDAYS), Disp: 12 capsule, Rfl: 1 .  acetaminophen (TYLENOL) 325 MG tablet, Take 650 mg 4 (four) times daily by mouth., Disp: , Rfl:  .  albuterol (PROVENTIL HFA;VENTOLIN HFA) 108 (90 Base) MCG/ACT inhaler, Inhale 2 puffs into the lungs every 6 (six) hours as needed for wheezing or shortness of breath. (Patient not taking: Reported on 08/30/2018), Disp: 1 Inhaler, Rfl: 2 .  cyclobenzaprine (FLEXERIL) 5 MG tablet, Take 1 tablet (5 mg total) by mouth 3 (three) times daily as needed for muscle spasms. (Patient not taking: Reported on 08/30/2018), Disp: 50 tablet, Rfl: 1 .  docusate sodium (COLACE) 100 MG capsule, Take 1 capsule (100 mg total) by mouth 2 (two) times daily. (Patient not taking: Reported on 08/30/2018), Disp: 60 capsule, Rfl: 0 .  lidocaine-prilocaine (EMLA)  cream, Apply 1 application topically as needed. (Patient not taking: Reported on 08/30/2018), Disp: 30 g, Rfl: 0 .  nitrofurantoin (MACRODANTIN) 100 MG capsule, Take 100 mg at bedtime by mouth., Disp: , Rfl:  .  senna (SENOKOT) 8.6 MG TABS tablet, Take 2 tablets 2 (two) times daily by mouth., Disp: , Rfl:  .  terconazole (TERAZOL 3) 0.8 % vaginal cream, Place 1 applicator vaginally at bedtime. Give 3 days and hold 4 days , for vaginal yeast infection ( per home regimen ) (Patient not taking: Reported on 08/30/2018), Disp: 20 g, Rfl: 11  Review of Systems  Constitutional: Negative for appetite change, chills, fatigue and fever.  Respiratory: Negative for chest tightness and shortness of breath.   Cardiovascular: Negative for chest pain and palpitations.  Gastrointestinal: Negative for abdominal pain, nausea and vomiting.  Endocrine: Negative for polydipsia, polyphagia and polyuria.  Musculoskeletal: Positive for arthralgias (chronic ) and back pain (chronic).  Neurological: Negative for dizziness and weakness.    Social History   Tobacco Use  . Smoking status: Former Smoker    Packs/day: 1.00    Years: 30.00    Pack years: 30.00    Types: Cigarettes    Quit date: 07/17/2012    Years since quitting: 6.1  . Smokeless tobacco: Never Used  . Tobacco comment:    Substance Use Topics  . Alcohol use: Not Currently    Alcohol/week: 0.0 standard drinks      Objective:   BP 110/68 (BP Location: Left Arm, Patient Position: Sitting, Cuff Size: Large)   Pulse 76   Temp 98.4 F (36.9 C) (Oral)   Resp 16   Ht 5' (1.524 m)   Wt 156 lb (70.8 kg)   SpO2 96%   BMI 30.47 kg/m  Vitals:   08/30/18 1129  BP: 110/68  Pulse: 76  Resp: 16  Temp: 98.4 F (36.9 C)  TempSrc: Oral  SpO2: 96%  Weight: 156 lb (70.8 kg)  Height: 5' (1.524 m)     Physical Exam Vitals signs reviewed.  Constitutional:      General: She is not in acute distress.    Appearance: Normal appearance. She is  well-developed. She is obese. She is not ill-appearing or diaphoretic.  Neck:     Musculoskeletal: Normal range of motion and neck supple.  Cardiovascular:     Rate and Rhythm: Normal rate and regular rhythm.     Pulses: Normal pulses.     Heart sounds: Normal heart sounds. No murmur. No friction rub. No gallop.   Pulmonary:     Effort: Pulmonary effort is normal. No respiratory distress.     Breath sounds: Rhonchi (throughout) present. No wheezing or rales.  Neurological:     Mental Status: She is alert.  Psychiatric:        Mood and Affect: Mood normal.  Behavior: Behavior normal.        Thought Content: Thought content normal.        Judgment: Judgment normal.      No results found for any visits on 08/30/18.     Assessment & Plan    1. 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 Continue Lantus 65 units BID and trulicity 1.5mg  once weekly. Will check labs as below and f/u pending results. - CBC w/Diff/Platelet - Basic Metabolic Panel (BMET) - HgB A1c  2. Simple chronic bronchitis (HCC) Stable. Diagnosis pulled for medication refill. Continue current medical treatment plan. Not using albuterol currently but had more prominent rhonchi today. Advised to use albuterol q 6-8 hours for next 4-5 days.  - albuterol (VENTOLIN HFA) 108 (90 Base) MCG/ACT inhaler; Inhale 2 puffs into the lungs every 6 (six) hours as needed for wheezing or shortness of breath.  Dispense: 18 g; Refill: [place], PA-C  [facility]

## 2018-08-30 ENCOUNTER — Encounter: Payer: Self-pay | Admitting: Physician Assistant

## 2018-08-30 ENCOUNTER — Other Ambulatory Visit: Payer: Self-pay

## 2018-08-30 ENCOUNTER — Ambulatory Visit (INDEPENDENT_AMBULATORY_CARE_PROVIDER_SITE_OTHER): Payer: Medicare Other | Admitting: Physician Assistant

## 2018-08-30 ENCOUNTER — Ambulatory Visit: Payer: Self-pay | Admitting: Physician Assistant

## 2018-08-30 VITALS — BP 110/68 | HR 76 | Temp 98.4°F | Resp 16 | Ht 60.0 in | Wt 156.0 lb

## 2018-08-30 DIAGNOSIS — J410 Simple chronic bronchitis: Secondary | ICD-10-CM | POA: Diagnosis not present

## 2018-08-30 DIAGNOSIS — Z794 Long term (current) use of insulin: Secondary | ICD-10-CM

## 2018-08-30 DIAGNOSIS — E113299 Type 2 diabetes mellitus with mild nonproliferative diabetic retinopathy without macular edema, unspecified eye: Secondary | ICD-10-CM

## 2018-08-30 MED ORDER — ALBUTEROL SULFATE HFA 108 (90 BASE) MCG/ACT IN AERS: 2.0000 | INHALATION_SPRAY | Freq: Four times a day (QID) | RESPIRATORY_TRACT | 1 refills | Status: DC | PRN

## 2018-08-30 NOTE — Patient Instructions (Signed)
Diabetes Mellitus and Nutrition, Adult  When you have diabetes (diabetes mellitus), it is very important to have healthy eating habits because your blood sugar (glucose) levels are greatly affected by what you eat and drink. Eating healthy foods in the appropriate amounts, at about the same times every day, can help you:  · Control your blood glucose.  · Lower your risk of heart disease.  · Improve your blood pressure.  · Reach or maintain a healthy weight.  Every person with diabetes is different, and each person has different needs for a meal plan. Your health care provider may recommend that you work with a diet and nutrition specialist (dietitian) to make a meal plan that is best for you. Your meal plan may vary depending on factors such as:  · The calories you need.  · The medicines you take.  · Your weight.  · Your blood glucose, blood pressure, and cholesterol levels.  · Your activity level.  · Other health conditions you have, such as heart or kidney disease.  How do carbohydrates affect me?  Carbohydrates, also called carbs, affect your blood glucose level more than any other type of food. Eating carbs naturally raises the amount of glucose in your blood. Carb counting is a method for keeping track of how many carbs you eat. Counting carbs is important to keep your blood glucose at a healthy level, especially if you use insulin or take certain oral diabetes medicines.  It is important to know how many carbs you can safely have in each meal. This is different for every person. Your dietitian can help you calculate how many carbs you should have at each meal and for each snack.  Foods that contain carbs include:  · Bread, cereal, rice, pasta, and crackers.  · Potatoes and corn.  · Peas, beans, and lentils.  · Milk and yogurt.  · Fruit and juice.  · Desserts, such as cakes, cookies, ice cream, and candy.  How does alcohol affect me?  Alcohol can cause a sudden decrease in blood glucose (hypoglycemia),  especially if you use insulin or take certain oral diabetes medicines. Hypoglycemia can be a life-threatening condition. Symptoms of hypoglycemia (sleepiness, dizziness, and confusion) are similar to symptoms of having too much alcohol.  If your health care provider says that alcohol is safe for you, follow these guidelines:  · Limit alcohol intake to no more than 1 drink per day for nonpregnant women and 2 drinks per day for men. One drink equals 12 oz of beer, 5 oz of wine, or 1½ oz of hard liquor.  · Do not drink on an empty stomach.  · Keep yourself hydrated with water, diet soda, or unsweetened iced tea.  · Keep in mind that regular soda, juice, and other mixers may contain a lot of sugar and must be counted as carbs.  What are tips for following this plan?    Reading food labels  · Start by checking the serving size on the "Nutrition Facts" label of packaged foods and drinks. The amount of calories, carbs, fats, and other nutrients listed on the label is based on one serving of the item. Many items contain more than one serving per package.  · Check the total grams (g) of carbs in one serving. You can calculate the number of servings of carbs in one serving by dividing the total carbs by 15. For example, if a food has 30 g of total carbs, it would be equal to 2   servings of carbs.  · Check the number of grams (g) of saturated and trans fats in one serving. Choose foods that have low or no amount of these fats.  · Check the number of milligrams (mg) of salt (sodium) in one serving. Most people should limit total sodium intake to less than 2,300 mg per day.  · Always check the nutrition information of foods labeled as "low-fat" or "nonfat". These foods may be higher in added sugar or refined carbs and should be avoided.  · Talk to your dietitian to identify your daily goals for nutrients listed on the label.  Shopping  · Avoid buying canned, premade, or processed foods. These foods tend to be high in fat, sodium,  and added sugar.  · Shop around the outside edge of the grocery store. This includes fresh fruits and vegetables, bulk grains, fresh meats, and fresh dairy.  Cooking  · Use low-heat cooking methods, such as baking, instead of high-heat cooking methods like deep frying.  · Cook using healthy oils, such as olive, canola, or sunflower oil.  · Avoid cooking with butter, cream, or high-fat meats.  Meal planning  · Eat meals and snacks regularly, preferably at the same times every day. Avoid going long periods of time without eating.  · Eat foods high in fiber, such as fresh fruits, vegetables, beans, and whole grains. Talk to your dietitian about how many servings of carbs you can eat at each meal.  · Eat 4-6 ounces (oz) of lean protein each day, such as lean meat, chicken, fish, eggs, or tofu. One oz of lean protein is equal to:  ? 1 oz of meat, chicken, or fish.  ? 1 egg.  ? ¼ cup of tofu.  · Eat some foods each day that contain healthy fats, such as avocado, nuts, seeds, and fish.  Lifestyle  · Check your blood glucose regularly.  · Exercise regularly as told by your health care provider. This may include:  ? 150 minutes of moderate-intensity or vigorous-intensity exercise each week. This could be brisk walking, biking, or water aerobics.  ? Stretching and doing strength exercises, such as yoga or weightlifting, at least 2 times a week.  · Take medicines as told by your health care provider.  · Do not use any products that contain nicotine or tobacco, such as cigarettes and e-cigarettes. If you need help quitting, ask your health care provider.  · Work with a counselor or diabetes educator to identify strategies to manage stress and any emotional and social challenges.  Questions to ask a health care provider  · Do I need to meet with a diabetes educator?  · Do I need to meet with a dietitian?  · What number can I call if I have questions?  · When are the best times to check my blood glucose?  Where to find more  information:  · American Diabetes Association: diabetes.org  · Academy of Nutrition and Dietetics: www.eatright.org  · National Institute of Diabetes and Digestive and Kidney Diseases (NIH): www.niddk.nih.gov  Summary  · A healthy meal plan will help you control your blood glucose and maintain a healthy lifestyle.  · Working with a diet and nutrition specialist (dietitian) can help you make a meal plan that is best for you.  · Keep in mind that carbohydrates (carbs) and alcohol have immediate effects on your blood glucose levels. It is important to count carbs and to use alcohol carefully.  This information is not intended to   replace advice given to you by your health care provider. Make sure you discuss any questions you have with your health care provider.  Document Released: 11/19/2004 Document Revised: 09/22/2016 Document Reviewed: 03/29/2016  Elsevier Interactive Patient Education © 2019 Elsevier Inc.

## 2018-08-31 ENCOUNTER — Telehealth: Payer: Self-pay

## 2018-08-31 LAB — BASIC METABOLIC PANEL
BUN/Creatinine Ratio: 28 (ref 12–28)
BUN: 27 mg/dL (ref 8–27)
CO2: 22 mmol/L (ref 20–29)
Calcium: 10 mg/dL (ref 8.7–10.3)
Chloride: 103 mmol/L (ref 96–106)
Creatinine, Ser: 0.97 mg/dL (ref 0.57–1.00)
GFR calc Af Amer: 67 mL/min/{1.73_m2} (ref >59)
GFR calc non Af Amer: 59 mL/min/{1.73_m2} — ABNORMAL LOW (ref >59)
Glucose: 197 mg/dL — ABNORMAL HIGH (ref 65–99)
Potassium: 4.6 mmol/L (ref 3.5–5.2)
Sodium: 140 mmol/L (ref 134–144)

## 2018-08-31 LAB — CBC WITH DIFFERENTIAL/PLATELET
Basophils Absolute: 0 10*3/uL (ref 0.0–0.2)
Basos: 0 %
EOS (ABSOLUTE): 0.3 10*3/uL (ref 0.0–0.4)
Eos: 3 %
Hematocrit: 39.9 % (ref 34.0–46.6)
Hemoglobin: 12.9 g/dL (ref 11.1–15.9)
Immature Grans (Abs): 0 10*3/uL (ref 0.0–0.1)
Immature Granulocytes: 0 %
Lymphocytes Absolute: 2.1 10*3/uL (ref 0.7–3.1)
Lymphs: 21 %
MCH: 29.5 pg (ref 26.6–33.0)
MCHC: 32.3 g/dL (ref 31.5–35.7)
MCV: 91 fL (ref 79–97)
Monocytes Absolute: 0.6 10*3/uL (ref 0.1–0.9)
Monocytes: 6 %
Neutrophils Absolute: 6.9 10*3/uL (ref 1.4–7.0)
Neutrophils: 70 %
Platelets: 169 10*3/uL (ref 150–450)
RBC: 4.38 x10E6/uL (ref 3.77–5.28)
RDW: 14.1 % (ref 11.7–15.4)
WBC: 10 10*3/uL (ref 3.4–10.8)

## 2018-08-31 LAB — HEMOGLOBIN A1C
Est. average glucose Bld gHb Est-mCnc: 194 mg/dL
Hgb A1c MFr Bld: 8.4 % — ABNORMAL HIGH (ref 4.8–5.6)

## 2018-08-31 NOTE — Telephone Encounter (Signed)
Patient advised as directed below. 

## 2018-08-31 NOTE — Telephone Encounter (Signed)
-----   Message from Mar Daring, [place] sent at 08/31/2018  8:35 AM EDT ----- A1c has come down to 8.4 from 8.8. Keep up the good work. All other labs were all normal.

## 2018-09-07 LAB — HM DIABETES EYE EXAM

## 2018-09-14 ENCOUNTER — Other Ambulatory Visit: Payer: Self-pay | Admitting: Physician Assistant

## 2018-09-14 DIAGNOSIS — E559 Vitamin D deficiency, unspecified: Secondary | ICD-10-CM

## 2018-09-15 ENCOUNTER — Other Ambulatory Visit: Payer: Self-pay

## 2018-09-15 DIAGNOSIS — M4316 Spondylolisthesis, lumbar region: Secondary | ICD-10-CM

## 2018-09-15 MED ORDER — HYDROCODONE-ACETAMINOPHEN 10-325 MG PO TABS: 1.0000 | ORAL_TABLET | Freq: Three times a day (TID) | ORAL | 0 refills | Status: DC | PRN

## 2018-09-15 NOTE — Telephone Encounter (Signed)
Patient requesting refill on the following medication: HYDROcodone-acetaminophen (NORCO) 10-325 MG tablet   Pharmacy:[id number] - [place], [place] - [address]

## 2018-09-19 ENCOUNTER — Other Ambulatory Visit: Payer: Self-pay | Admitting: Cardiovascular Disease

## 2018-09-22 ENCOUNTER — Telehealth: Payer: Self-pay | Admitting: Cardiovascular Disease

## 2018-09-22 NOTE — Telephone Encounter (Signed)
error 

## 2018-09-25 ENCOUNTER — Ambulatory Visit: Payer: Medicare Other | Admitting: Cardiovascular Disease

## 2018-09-26 ENCOUNTER — Telehealth: Payer: Self-pay | Admitting: Cardiovascular Disease

## 2018-09-26 NOTE — Telephone Encounter (Signed)
Virtual Visit Pre-Appointment Phone Call  "(Name), I am calling you today to discuss your upcoming appointment. We are currently trying to limit exposure to the virus that causes COVID-19 by seeing patients at home rather than in the office."  1. "What is the BEST phone number to call the day of the visit?" - include this in appointment notes  2. Do you have or have access to (through a family member/friend) a smartphone with video capability that we can use for your visit?" a. If yes - list this number in appt notes as cell (if different from BEST phone #) and list the appointment type as a VIDEO visit in appointment notes b. If no - list the appointment type as a PHONE visit in appointment notes  3. Confirm consent - "In the setting of the current Covid19 crisis, you are scheduled for a (phone or video) visit with your provider on (date) at (time).  Just as we do with many in-office visits, in order for you to participate in this visit, we must obtain consent.  If you'd like, I can send this to your mychart (if signed up) or email for you to review.  Otherwise, I can obtain your verbal consent now.  All virtual visits are billed to your insurance company just like a normal visit would be.  By agreeing to a virtual visit, we'd like you to understand that the technology does not allow for your provider to perform an examination, and thus may limit your provider's ability to fully assess your condition. If your provider identifies any concerns that need to be evaluated in person, we will make arrangements to do so.  Finally, though the technology is pretty good, we cannot assure that it will always work on either your or our end, and in the setting of a video visit, we may have to convert it to a phone-only visit.  In either situation, we cannot ensure that we have a secure connection.  Are you willing to proceed?" STAFF: Did the patient verbally acknowledge consent to telehealth visit? Document  YES/NO here: YES  4. Advise patient to be prepared - "Two hours prior to your appointment, go ahead and check your blood pressure, pulse, oxygen saturation, and your weight (if you have the equipment to check those) and write them all down. When your visit starts, your provider will ask you for this information. If you have an Apple Watch or Kardia device, please plan to have heart rate information ready on the day of your appointment. Please have a pen and paper handy nearby the day of the visit as well."  5. Give patient instructions for MyChart download to smartphone OR [email or web address] as below if video visit (depending on what platform provider is using)  6. Inform patient they will receive a phone call 15 minutes prior to their appointment time (may be from unknown caller ID) so they should be prepared to answer    TELEPHONE CALL NOTE  ARIANA JUUL has been deemed a candidate for a follow-up tele-health visit to limit community exposure during the Covid-19 pandemic. I spoke with the patient via phone to ensure availability of phone/video source, confirm preferred email & phone number, and discuss instructions and expectations.  I reminded Andrea [name] to be prepared with any vital sign and/or heart rhythm information that could potentially be obtained via home monitoring, at the time of her visit. I reminded Andrea [name] to expect a phone call prior to  her visit.  Clarisse Gouge 09/26/2018 1:37 PM   INSTRUCTIONS FOR DOWNLOADING THE MYCHART APP TO SMARTPHONE  - The patient must first make sure to have activated MyChart and know their login information - If Apple, go to [employer] and type in MyChart in the search bar and download the app. If Android, ask patient to go to [employer] and type in [place] in the search bar and download the app. The app is free but as with any other app downloads, their phone may require them to verify saved payment information or  Apple/Android password.  - The patient will need to then log into the app with their MyChart username and password, and select [facility] as their healthcare provider to link the account. When it is time for your visit, go to the MyChart app, find appointments, and click Begin Video Visit. Be sure to Select Allow for your device to access the Microphone and Camera for your visit. You will then be connected, and your provider will be with you shortly.  **If they have any issues connecting, or need assistance please contact MyChart service desk [phone])**  **If using a computer, in order to ensure the best quality for their visit they will need to use either of the following Internet Browsers: [employer], or Google Chrome**  IF USING DOXIMITY or DOXY.ME - The patient will receive a link just prior to their visit by text.     FULL LENGTH CONSENT FOR TELE-HEALTH VISIT   I hereby voluntarily request, consent and authorize [facility] and its employed or contracted physicians, physician assistants, nurse practitioners or other licensed health care professionals (the Practitioner), to provide me with telemedicine health care services (the Services") as deemed necessary by the treating Practitioner. I acknowledge and consent to receive the Services by the Practitioner via telemedicine. I understand that the telemedicine visit will involve communicating with the Practitioner through live audiovisual communication technology and the disclosure of certain medical information by electronic transmission. I acknowledge that I have been given the opportunity to request an in-person assessment or other available alternative prior to the telemedicine visit and am voluntarily participating in the telemedicine visit.  I understand that I have the right to withhold or withdraw my consent to the use of telemedicine in the course of my care at any time, without affecting my right to future care  or treatment, and that the Practitioner or I may terminate the telemedicine visit at any time. I understand that I have the right to inspect all information obtained and/or recorded in the course of the telemedicine visit and may receive copies of available information for a reasonable fee.  I understand that some of the potential risks of receiving the Services via telemedicine include:   Delay or interruption in medical evaluation due to technological equipment failure or disruption;  Information transmitted may not be sufficient (e.g. poor resolution of images) to allow for appropriate medical decision making by the Practitioner; and/or   In rare instances, security protocols could fail, causing a breach of personal health information.  Furthermore, I acknowledge that it is my responsibility to provide information about my medical history, conditions and care that is complete and accurate to the best of my ability. I acknowledge that Practitioner's advice, recommendations, and/or decision may be based on factors not within their control, such as incomplete or inaccurate data provided by me or distortions of diagnostic images or specimens that may result from electronic transmissions. I  understand that the practice of medicine is not an exact science and that Practitioner makes no warranties or guarantees regarding treatment outcomes. I acknowledge that I will receive a copy of this consent concurrently upon execution via email to the email address I last provided but may also request a printed copy by calling the office of [facility].    I understand that my insurance will be billed for this visit.   I have read or had this consent read to me.  I understand the contents of this consent, which adequately explains the benefits and risks of the Services being provided via telemedicine.   I have been provided ample opportunity to ask questions regarding this consent and the Services and have had  my questions answered to my satisfaction.  I give my informed consent for the services to be provided through the use of telemedicine in my medical care  By participating in this telemedicine visit I agree to the above.

## 2018-09-26 NOTE — Progress Notes (Signed)
Virtual Visit via Telephone Note   This visit type was conducted due to national recommendations for restrictions regarding the COVID-19 Pandemic (e.g. social distancing) in an effort to limit this patient's exposure and mitigate transmission in our community.  Due to her co-morbid illnesses, this patient is at least at moderate risk for complications without adequate follow up.  This format is felt to be most appropriate for this patient at this time.  The patient did not have access to video technology/had technical difficulties with video requiring transitioning to audio format only (telephone).  All issues noted in this document were discussed and addressed.  No physical exam could be performed with this format.  Please refer to the patient's chart for her  consent to telehealth for [facility].   I connected with  Andrea [name] on 09/27/18 by a video enabled telemedicine application and verified that I am speaking with the correct person using two identifiers. I discussed the limitations of evaluation and management by telemedicine. The patient expressed understanding and agreed to proceed.   Evaluation Performed:  Follow-up visit  Date:  09/27/2018   ID:  Andrea, [name] 1945/07/31, MRN [id number]  Patient Location:  [place] [address]   Provider location:   [facility], [place] office  PCP:  Mar Daring, PA-C  Cardiologist:  Patsy Baltimore   Chief Complaint:  Leg weakness, bp elevated   History of Present Illness:    Andrea [name] is a 73 y.o. female who presents via audio/video conferencing for a telehealth visit today.   The patient does not symptoms concerning for COVID-19 infection (fever, chills, cough, or new SHORTNESS OF BREATH).   Patient has a past medical history of obesity,  Diabetes type II, hemoglobin A1c 10.3  Prior stroke/TIA Prior smoking, stopped 4 to 5 yrs ago hyperlipidemia,  right bundle  branch block,  myocardial infarction (in 1994-95) , mid anterior wall to apical region, anteroseptal, apical and apical inferior region systolic heart failure with ejection fraction of 45-50% in May 2014 Normal ejection fraction March 2018 Who presents for follow-up of her coronary artery disease, history of previous MI  Last office visit February 2019  Doing well,  No chest pain, no SOB No edema  Walks with a walker, some gait instability,  previous falls but none recently  Previous fall leading to hip fracture  HBA1C 8.4, down over two year Cut back on soda, sweat tea  Previous lab work reviewed with her Total cholesterol 136, LDL 43   Previous testing reviewed with her in detail Echocardiogram March 2018 ejection fraction 55-60%  Stress test March 2018 fixed defect mid to distal anterior wall, previous MI  Prior CV studies:   The following studies were reviewed today:    Past Medical History:  Diagnosis Date  . Bronchitis   . Chickenpox   . Coronary artery disease   . Depression   . Diabetes mellitus type 2, uncomplicated ([place])   . Diabetes mellitus without complication ([place])   . Diabetic retinopathy ([place])   . Dupuytren contracture   . Frequent urinary tract infections   . GERD (gastroesophageal reflux disease)   . History of hand surgery 2011   left hand  . Hyperlipidemia, unspecified   . Irritable bowel syndrome   . MI (myocardial infarction) ([place]) 1994  . Neuromuscular disorder ([place])    nerve pain in back and legs  . Osteoporosis   .  Pneumonia 2014  . Stroke ([place])   . TIA (transient ischemic attack)   . Vitamin D deficiency, unspecified   . Wheezing    Past Surgical History:  Procedure Laterality Date  . CARDIAC CATHETERIZATION  1994  . COLONOSCOPY     x3  . CORONARY ANGIOPLASTY  1994  . EYE SURGERY     cataracts  . HAND SURGERY Left 2011   left hand release of contractures  . HIP FRACTURE SURGERY  11/2013  . PR COLSC FLX W/REMOVAL LESION  BY HOT BX FORCEPS   08/21/2015   Procedure: COLONOSCOPY, FLEXIBLE, PROXIMAL TO SPLENIC FLEXURE; W/REMOVAL TUMOR/POLYP/OTHER LESION, HOT BX FORCEP/CAUTE; Surgeon: Carlena Hurl, MD; Location: OR [facility]; Service: General Surgery     Current Meds  Medication Sig  . albuterol (VENTOLIN HFA) 108 (90 Base) MCG/ACT inhaler Inhale 2 puffs into the lungs every 6 (six) hours as needed for wheezing or shortness of breath.  . Alcohol Swabs (ALCOHOL PREP) PADS Use twice daily prior to SQ injection of insulin to clean skin  . aspirin EC 81 MG tablet Take 81 mg daily by mouth.  [name] Kitchen atorvastatin (LIPITOR) 80 MG tablet TAKE 1 TABLET BY MOUTH EVERY DAY  . Calcium Carb-Cholecalciferol (CALCIUM+D3) 600-800 MG-UNIT TABS Take 1 capsule by mouth 2 (two) times daily.  . Cyanocobalamin (B-12) 1000 MCG CAPS Take 1,000 mcg by mouth daily.   [name] Kitchen HYDROcodone-acetaminophen (NORCO) 10-325 MG tablet Take 1 tablet by mouth every 8 (eight) hours as needed.  . Insulin Syringe-Needle U-100 29G 1 ML MISC Use to inject 65 units Lantus SQ BIDWC  . LANTUS 100 UNIT/ML injection INJECT 65 UNITS INTO THE SKIN TWICE DAILY WITH A MEAL  . losartan (COZAAR) 25 MG tablet Take 1 tablet (25 mg total) by mouth daily.  . metoprolol succinate (TOPROL-XL) 25 MG 24 hr tablet TAKE 1/2 TABLET BY MOUTH DAILY.  . Multiple Vitamins-Minerals (ICAPS AREDS 2 PO) Take 1 capsule by mouth 2 (two) times daily.   . nitrofurantoin (MACRODANTIN) 100 MG capsule Take 100 mg at bedtime by mouth.  . Omega-3 Fatty Acids (FISH OIL) 1200 MG CAPS Take 1 capsule (1,200 mg total) by mouth daily.  [name] Kitchen omeprazole (PRILOSEC) 40 MG capsule Take 1 capsule (40 mg total) by mouth daily.  Andrea [name] DELICA LANCETS 08M MISC TO CHECK BLOOD SUGAR DAILY  . ONETOUCH VERIO test strip To check blood sugar once daily  . oxybutynin (DITROPAN-XL) 10 MG 24 hr tablet TAKE 1 TABLET BY MOUTH EVERYDAY AT BEDTIME  . sertraline (ZOLOFT) 25 MG tablet Take 1 tablet (25 mg total) by mouth at  bedtime.  . TRULICITY 1.5 VH/8.4ON SOPN INJECT 1.5MG  INTO THE SKIN ONCE A WEEK AS DIRECTED  . Vitamin D, Ergocalciferol, (DRISDOL) 1.25 MG (50000 UT) CAPS capsule TAKE ONE CAPSULE BY MOUTH ONCE WEEKLY ON THE SAME DAY EACH WEEK (ON FRIDAYS)     Allergies:   Metformin and related and Sulfa antibiotics   Social History   Tobacco Use  . Smoking status: Former Smoker    Packs/day: 1.00    Years: 30.00    Pack years: 30.00    Types: Cigarettes    Quit date: 07/17/2012    Years since quitting: 6.2  . Smokeless tobacco: Never Used  . Tobacco comment:    Substance Use Topics  . Alcohol use: Not Currently    Alcohol/week: 0.0 standard drinks  . Drug use: No     Current Outpatient Medications on File Prior to Visit  Medication  Sig Dispense Refill  . albuterol (VENTOLIN HFA) 108 (90 Base) MCG/ACT inhaler Inhale 2 puffs into the lungs every 6 (six) hours as needed for wheezing or shortness of breath. 18 g 1  . Alcohol Swabs (ALCOHOL PREP) PADS Use twice daily prior to SQ injection of insulin to clean skin 100 each 6  . aspirin EC 81 MG tablet Take 81 mg daily by mouth.    [name] Kitchen atorvastatin (LIPITOR) 80 MG tablet TAKE 1 TABLET BY MOUTH EVERY DAY 30 tablet 0  . Calcium Carb-Cholecalciferol (CALCIUM+D3) 600-800 MG-UNIT TABS Take 1 capsule by mouth 2 (two) times daily. 180 tablet 1  . Cyanocobalamin (B-12) 1000 MCG CAPS Take 1,000 mcg by mouth daily.     [name] Kitchen HYDROcodone-acetaminophen (NORCO) 10-325 MG tablet Take 1 tablet by mouth every 8 (eight) hours as needed. 90 tablet 0  . Insulin Syringe-Needle U-100 29G 1 ML MISC Use to inject 65 units Lantus SQ BIDWC 100 each 6  . LANTUS 100 UNIT/ML injection INJECT 65 UNITS INTO THE SKIN TWICE DAILY WITH A MEAL 10 mL 99  . losartan (COZAAR) 25 MG tablet Take 1 tablet (25 mg total) by mouth daily. 90 tablet 1  . metoprolol succinate (TOPROL-XL) 25 MG 24 hr tablet TAKE 1/2 TABLET BY MOUTH DAILY. 45 tablet 3  . Multiple Vitamins-Minerals (ICAPS AREDS 2 PO) Take  1 capsule by mouth 2 (two) times daily.     . nitrofurantoin (MACRODANTIN) 100 MG capsule Take 100 mg at bedtime by mouth.    . Omega-3 Fatty Acids (FISH OIL) 1200 MG CAPS Take 1 capsule (1,200 mg total) by mouth daily. 90 capsule 3  . omeprazole (PRILOSEC) 40 MG capsule Take 1 capsule (40 mg total) by mouth daily. 90 capsule 3  . ONETOUCH DELICA LANCETS 57S MISC TO CHECK BLOOD SUGAR DAILY 100 each 5  . ONETOUCH VERIO test strip To check blood sugar once daily 100 each 3  . oxybutynin (DITROPAN-XL) 10 MG 24 hr tablet TAKE 1 TABLET BY MOUTH EVERYDAY AT BEDTIME 90 tablet 1  . sertraline (ZOLOFT) 25 MG tablet Take 1 tablet (25 mg total) by mouth at bedtime. 90 tablet 1  . TRULICITY 1.5 VX/7.9TJ SOPN INJECT 1.5MG  INTO THE SKIN ONCE A WEEK AS DIRECTED 15 mL 11  . Vitamin D, Ergocalciferol, (DRISDOL) 1.25 MG (50000 UT) CAPS capsule TAKE ONE CAPSULE BY MOUTH ONCE WEEKLY ON THE SAME DAY EACH WEEK (ON FRIDAYS) 12 capsule 1   No current facility-administered medications on file prior to visit.      Family Hx: The patient's family history includes AAA (abdominal aortic aneurysm) in her daughter; Anxiety disorder in her daughter; Arthritis in her daughter; Asthma in her brother; Cataracts in her mother; Depression in her daughter and mother; Diabetes in her daughter; Heart disease in her mother; Hyperlipidemia in her daughter and daughter; Hypertension in her daughter and mother; Plantar fasciitis in her daughter; Stroke in her mother.  ROS:   Please see the history of present illness.    Review of Systems  Constitutional: Negative.   HENT: Negative.   Respiratory: Negative.   Cardiovascular: Negative.   Gastrointestinal: Negative.   Musculoskeletal: Negative.        Gait instability  Neurological: Negative.   Psychiatric/Behavioral: Negative.   All other systems reviewed and are negative.    Labs/Other Tests and Data Reviewed:    Recent Labs: 12/19/2017: ALT 19; TSH 0.908 08/30/2018: BUN 27;  Creatinine, Ser 0.97; Hemoglobin 12.9; Platelets 169; Potassium 4.6; Sodium  140   Recent Lipid Panel Lab Results  Component Value Date/Time   CHOL 136 12/19/2017 03:55 PM   TRIG 150 (H) 12/19/2017 03:55 PM   HDL 63 12/19/2017 03:55 PM   CHOLHDL 2.2 12/19/2017 03:55 PM   CHOLHDL 2.1 12/16/2016 03:39 PM   LDLCALC 43 12/19/2017 03:55 PM   LDLCALC 56 12/16/2016 03:39 PM    Wt Readings from Last 3 Encounters:  09/27/18 154 lb (69.9 kg)  08/30/18 156 lb (70.8 kg)  04/19/18 154 lb 3.2 oz (69.9 kg)     Exam:    Vital Signs: Vital signs may also be detailed in the HPI BP (!) 155/55 (BP Location: Right Arm, Patient Position: Sitting, Cuff Size: Normal)   Pulse 97   Ht 5' (1.524 m)   Wt 154 lb (69.9 kg)   BMI 30.08 kg/m   Wt Readings from Last 3 Encounters:  09/27/18 154 lb (69.9 kg)  08/30/18 156 lb (70.8 kg)  04/19/18 154 lb 3.2 oz (69.9 kg)   Temp Readings from Last 3 Encounters:  08/30/18 98.4 F (36.9 C) (Oral)  04/19/18 97.8 F (36.6 C) (Oral)  12/19/17 98.3 F (36.8 C) (Oral)   BP Readings from Last 3 Encounters:  09/27/18 (!) 155/55  08/30/18 110/68  04/19/18 (!) 108/58   Pulse Readings from Last 3 Encounters:  09/27/18 97  08/30/18 76  04/19/18 (!) 108    150/90,  36  Well nourished, well developed female in no acute distress. Constitutional:  oriented to person, place, and time. No distress.    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 No further workup at this time. Continue current medication regimen.  Diabetes, HBA1C 8.4, Diet changes  Gait  Instability Sedentary, unstable, uses a walker, No recent falls, leg pain  COVID-19 Education: The signs and symptoms of COVID-19 were discussed with the patient and how to seek care for testing (follow up with PCP or arrange E-visit).  The importance of social distancing was discussed today.  Patient Risk:   After full review of this patients clinical status, I feel that they are at least moderate risk at this time.  Time:   Today, I have spent 25 minutes with the patient with telehealth technology discussing the cardiac and medical problems/diagnoses detailed above   10 min spent reviewing the chart prior to patient visit today   Medication Adjustments/Labs and Tests Ordered: Current medicines are reviewed at length with the patient today.  Concerns regarding medicines are outlined above.   Tests Ordered: No tests ordered   Medication Changes: No changes made   Disposition: Follow-up in 6 months  Signed, Ida Rogue, MD  [place] Office [id number], [place], [place] [address]

## 2018-09-27 ENCOUNTER — Other Ambulatory Visit: Payer: Self-pay

## 2018-09-27 ENCOUNTER — Telehealth: Payer: Self-pay | Admitting: Cardiovascular Disease

## 2018-09-27 ENCOUNTER — Telehealth (INDEPENDENT_AMBULATORY_CARE_PROVIDER_SITE_OTHER): Payer: Medicare Other | Admitting: Cardiovascular Disease

## 2018-09-27 ENCOUNTER — Encounter: Payer: Self-pay | Admitting: Cardiovascular Disease

## 2018-09-27 VITALS — BP 155/55 | HR 97 | Ht 60.0 in | Wt 154.0 lb

## 2018-09-27 DIAGNOSIS — Z794 Long term (current) use of insulin: Secondary | ICD-10-CM | POA: Diagnosis not present

## 2018-09-27 DIAGNOSIS — E1159 Type 2 diabetes mellitus with other circulatory complications: Secondary | ICD-10-CM | POA: Diagnosis not present

## 2018-09-27 DIAGNOSIS — I25118 Atherosclerotic heart disease of native coronary artery with other forms of angina pectoris: Secondary | ICD-10-CM | POA: Diagnosis not present

## 2018-09-27 DIAGNOSIS — E782 Mixed hyperlipidemia: Secondary | ICD-10-CM

## 2018-09-27 DIAGNOSIS — I5022 Chronic systolic (congestive) heart failure: Secondary | ICD-10-CM

## 2018-09-27 DIAGNOSIS — Z87891 Personal history of nicotine dependence: Secondary | ICD-10-CM

## 2018-09-27 DIAGNOSIS — E1165 Type 2 diabetes mellitus with hyperglycemia: Secondary | ICD-10-CM

## 2018-09-27 NOTE — Telephone Encounter (Signed)
Patient called in with BP readings 155/55 while on the phone. After she hung up she rechecked it and got 128/55. Patient stated she might have cuff on wrong but wanted to let Dr. Rockey Situ know.

## 2018-09-27 NOTE — Patient Instructions (Signed)

## 2018-09-28 NOTE — Telephone Encounter (Signed)
Spoke with the pt. Pt sts that she purchased a BP machine and has began to monitor her BP daily. Pt sts that the first reading was elevated, sh believes it was due to user error. Pt sts that the recheck was 128/55. Adv the pt to ck her BP daily at the same time. Pt adv to sit and relax for 51min prior to checking. Pt adv to keep a log of her readings and to call the office to provide them for Dr.Gollan to review in 2 weeks. Pt adv to contact the office sooner of her BP is consistently elevated, systolic > 803. Pt verbalized understanding and voiced appreciation for the call.

## 2018-09-28 NOTE — Telephone Encounter (Signed)
Would continue to check blood pressure Some of her other numbers were very high 180 and higher Try amlodipine 5 (break the pill in half) If blood pressure continues to run high, greater than 140 on a regular basis, probably need a full 10 mg  There is a big difference between her measurements of 180 and 120.  We need to find out where it is really running

## 2018-10-03 ENCOUNTER — Ambulatory Visit: Payer: Medicare Other | Admitting: Physician Assistant

## 2018-10-13 ENCOUNTER — Telehealth: Payer: Self-pay | Admitting: Cardiovascular Disease

## 2018-10-13 DIAGNOSIS — I10 Essential (primary) hypertension: Secondary | ICD-10-CM

## 2018-10-13 MED ORDER — METOPROLOL SUCCINATE ER 25 MG PO TB24: 25.0000 mg | ORAL_TABLET | Freq: Every day | ORAL | Status: DC

## 2018-10-13 NOTE — Telephone Encounter (Signed)
I called and spoke with the patient. I advised her of Dr. Donivan Scull recommendations to increase metoprolol succinate to 25 mg once daily.  She voices understanding and is agreeable. She will record her BP/ HR for about a week on the higher dose metoprolol and call back with further BP (HR) readings.

## 2018-10-13 NOTE — Telephone Encounter (Signed)
Good numbers heartrate a little high Would she consider increasing the metoprolol up to a whole pil, 25 mg daily, Continue losartan at current dose

## 2018-10-13 NOTE — Telephone Encounter (Signed)
Patient called in with blood pressure readings (called to back door #- states she called [facility] and they gave her a number, which was the portable phone number).  I advised her to please call the main number next time.  She had a telehealth visit on 7/22 with Dr. Rockey Situ and her BP numbers were reported high at home.  She was advised to take her BP for 2 weeks and call with her readings.   BP (HR's) as below:  7/23- 129/56 (96) 7/24- 130/55 (98) 7/25- 127/55 (96) 7/27- 138/60 (96) 7/28- 128/56 (97) 7/29- 136/57 (100) 7/30- 130/62 (89) 7/31- 129/51 (87) 8/1- 136/58 (90) 8/2- 120/59 (75) 8/3- 143/61 (88) 8/4- 128/58 (90) 8/5- 129/59 (95)  She confirms she is taking: - losartan 25 mg once daily in the morning  - metoprolol succinate 25 mg- 0.5 tablet (12.5 mg) once daily in the morning.  She also confirms her BP (HR) readings are taken mid day.  She states she feels well at this time and denies any symptoms of SOB/ dizziness/ lightheaded.   She is aware I will forward to Dr. Rockey Situ to review and we will call her back with recommendations.  She voices understanding and is agreeable.

## 2018-10-14 ENCOUNTER — Other Ambulatory Visit: Payer: Self-pay | Admitting: Cardiovascular Disease

## 2018-10-16 ENCOUNTER — Other Ambulatory Visit: Payer: Self-pay | Admitting: Physician Assistant

## 2018-10-16 DIAGNOSIS — M4316 Spondylolisthesis, lumbar region: Secondary | ICD-10-CM

## 2018-10-16 MED ORDER — HYDROCODONE-ACETAMINOPHEN 10-325 MG PO TABS: 1.0000 | ORAL_TABLET | Freq: Three times a day (TID) | ORAL | 0 refills | Status: DC | PRN

## 2018-10-16 NOTE — Telephone Encounter (Signed)
Pt needing a refill on:  HYDROcodone-acetaminophen (NORCO) 10-325 MG tablet  Please fill at:  [id number] [place], [place] - [place] [phone] (Phone) [phone] (Fax)    Thanks, [facility]

## 2018-10-23 ENCOUNTER — Other Ambulatory Visit: Payer: Self-pay | Admitting: Physician Assistant

## 2018-10-23 DIAGNOSIS — E1142 Type 2 diabetes mellitus with diabetic polyneuropathy: Secondary | ICD-10-CM

## 2018-10-24 ENCOUNTER — Telehealth: Payer: Self-pay | Admitting: Cardiovascular Disease

## 2018-10-24 NOTE — Telephone Encounter (Signed)
On 8/7, Dr Rockey Situ had patient increase metoprolol succinate to 25 mg daily in telephone message. Patient calling today with most recent BP/HR since the med increase. Reports no complaints, no chest pain, shortness of breath, dizziness or palpitations. Says she's doing well. Advised I will forward to Dr Rockey Situ to review and will make her aware if there are any further changes. She is aware to continue her current medications at this time.

## 2018-10-24 NOTE — Telephone Encounter (Signed)
Patient calling in with HR and BP readings   BP  HR   8/8  122/61  87 8/9  119/51  98  8/10 130/57  89 8/11 141/66  87 8/12 131/60  83 8/13 120/54  81 8/14 123/55  86

## 2018-10-24 NOTE — Telephone Encounter (Signed)
Very good numbers both blood pressure and heart rate

## 2018-10-26 ENCOUNTER — Other Ambulatory Visit: Payer: Self-pay | Admitting: Physician Assistant

## 2018-10-26 DIAGNOSIS — K219 Gastro-esophageal reflux disease without esophagitis: Secondary | ICD-10-CM

## 2018-10-26 MED ORDER — OMEPRAZOLE 40 MG PO CPDR: 40.0000 mg | DELAYED_RELEASE_CAPSULE | Freq: Every day | ORAL | 3 refills | Status: DC

## 2018-10-26 NOTE — Telephone Encounter (Signed)
[facility] for the following medications:  omeprazole (PRILOSEC) 40 MG capsule  LOV: 08/30/2018 Please advise. Thanks TNP

## 2018-10-27 NOTE — Telephone Encounter (Signed)
No answer. Left detail message with recommendations, ok per DPR, and to call back if any questions.  

## 2018-10-30 NOTE — Telephone Encounter (Signed)
Spoke with patient and reviewed her readings were good. She was very appreciative for the call back with no further questions at this time.

## 2018-11-09 ENCOUNTER — Telehealth: Payer: Self-pay | Admitting: Physician Assistant

## 2018-11-09 LAB — ABI

## 2018-11-09 NOTE — Telephone Encounter (Signed)
They can send over the report of ABIs so I can send to vascular surgeon.

## 2018-11-09 NOTE — Telephone Encounter (Signed)
[place] with [facility] called saying she was doing a wellness visit with patient and she did a PAD test and it showed moderate PAD on the rt lower extremity.    Her call back is  [phone]  Con Memos

## 2018-11-10 NOTE — Telephone Encounter (Signed)
Left Message for [facility] with [facility]

## 2018-11-16 ENCOUNTER — Other Ambulatory Visit: Payer: Self-pay | Admitting: Physician Assistant

## 2018-11-16 DIAGNOSIS — M4316 Spondylolisthesis, lumbar region: Secondary | ICD-10-CM

## 2018-11-16 MED ORDER — HYDROCODONE-ACETAMINOPHEN 10-325 MG PO TABS: 1.0000 | ORAL_TABLET | Freq: Three times a day (TID) | ORAL | 0 refills | Status: DC | PRN

## 2018-11-16 NOTE — Telephone Encounter (Signed)
Pt needs a refill on her hydrocodone   [facility]

## 2018-11-17 NOTE — Telephone Encounter (Signed)
[place] with [facility] will send reports to [place] today.

## 2018-11-23 ENCOUNTER — Other Ambulatory Visit: Payer: Self-pay | Admitting: Physician Assistant

## 2018-11-23 DIAGNOSIS — M8589 Other specified disorders of bone density and structure, multiple sites: Secondary | ICD-10-CM

## 2018-11-26 ENCOUNTER — Other Ambulatory Visit: Payer: Self-pay | Admitting: Physician Assistant

## 2018-11-26 DIAGNOSIS — I10 Essential (primary) hypertension: Secondary | ICD-10-CM

## 2018-11-27 ENCOUNTER — Telehealth: Payer: Self-pay | Admitting: Cardiovascular Disease

## 2018-11-27 DIAGNOSIS — I10 Essential (primary) hypertension: Secondary | ICD-10-CM

## 2018-11-27 MED ORDER — METOPROLOL SUCCINATE ER 25 MG PO TB24: 25.0000 mg | ORAL_TABLET | Freq: Every day | ORAL | 3 refills | Status: DC

## 2018-11-27 NOTE — Telephone Encounter (Signed)
°*  STAT* If patient is at the pharmacy, call can be transferred to refill team.   1. Which medications need to be refilled? (please list name of each medication and dose if known)   Metoprolol 25 mg po q d   2. Which pharmacy/location (including street and city if local pharmacy) is [facility] [place]   3. Do they need a 30 day or 90 day supply? 90  Patient wants to make sure this is the right medication .[name] Kitchen  She says Dr. Rockey Situ recently increased it .

## 2018-11-27 NOTE — Telephone Encounter (Signed)
Confirmed with patient that current intended Metoprolol is 1 tablet (25 mg total) once daily.   Rx refill sent.

## 2018-11-27 NOTE — Telephone Encounter (Signed)
Please review for refill.  

## 2018-12-18 ENCOUNTER — Telehealth: Payer: Self-pay

## 2018-12-18 DIAGNOSIS — M4316 Spondylolisthesis, lumbar region: Secondary | ICD-10-CM

## 2018-12-18 MED ORDER — HYDROCODONE-ACETAMINOPHEN 10-325 MG PO TABS: 1.0000 | ORAL_TABLET | Freq: Three times a day (TID) | ORAL | 0 refills | Status: DC | PRN

## 2018-12-18 NOTE — Telephone Encounter (Signed)
Patient requesting refill request on the following medication HYDROcodone-acetaminophen (NORCO) 10-325 MG tablet   [facility] [place]

## 2018-12-21 ENCOUNTER — Encounter: Payer: Medicare Other | Admitting: Physician Assistant

## 2018-12-21 ENCOUNTER — Ambulatory Visit: Payer: Medicare Other

## 2018-12-27 NOTE — Progress Notes (Signed)
Patient: Andrea [name], Female    DOB: Jul 13, 1945, 73 y.o.   MRN: [id number] Visit Date: 12/28/2018  Today's Provider: Mar Daring, PA-C   Chief Complaint  Patient presents with   Medicare Wellness   Subjective:     Annual wellness visit Andrea [name] is a 73 y.o. female. She feels well. She reports exercising none. She reports she is sleeping well.  ----------------------------------------------------------- 03/09/2018-Significant osteoporosis  04/12/2017- Normal mammogram. Repeat screening in one year.  Review of Systems  Constitutional: Negative.   HENT: Negative.   Eyes: Negative.   Respiratory: Negative.   Cardiovascular: Negative.   Gastrointestinal: Positive for constipation.  Endocrine: Negative.   Genitourinary: Negative.   Musculoskeletal: Positive for arthralgias and back pain.  Skin: Negative.   Allergic/Immunologic: Negative.   Neurological: Negative.   Hematological: Negative.   Psychiatric/Behavioral: Positive for agitation. The patient is nervous/anxious.     Social History   Socioeconomic History   Marital status: Married    Spouse name: Roselie Awkward   Number of children: 3   Years of education: 8   Highest education level: 8th grade  Occupational History   Occupation: retired  Scientist, product/process development strain: Not hard at [employer] insecurity    Worry: Never true    Inability: Never true   Transportation needs    Medical: No    Non-medical: No  Tobacco Use   Smoking status: Former Smoker    Packs/day: 1.00    Years: 30.00    Pack years: 30.00    Types: Cigarettes    Quit date: 07/17/2012    Years since quitting: 6.4   Smokeless tobacco: Never Used   Tobacco comment:    Substance and Sexual Activity   Alcohol use: Not Currently    Alcohol/week: 0.0 standard drinks   Drug use: No   Sexual activity: Not on file  Lifestyle   Physical activity    Days per week: Not on file    Minutes per  session: Not on file   Stress: Not at all  Relationships   Social connections    Talks on phone: Patient refused    Gets together: Patient refused    Attends religious service: Patient refused    Active member of club or organization: Patient refused    Attends meetings of clubs or organizations: Patient refused    Relationship status: Patient refused   Intimate partner violence    Fear of current or ex partner: Patient refused    Emotionally abused: Patient refused    Physically abused: Patient refused    Forced sexual activity: Patient refused  Other Topics Concern   Not on file  Social History Narrative   Not on file    Past Medical History:  Diagnosis Date   Bronchitis    Chickenpox    Coronary artery disease    Depression    Diabetes mellitus type 2, uncomplicated ([place])    Diabetes mellitus without complication ([place])    Diabetic retinopathy ([place])    Dupuytren contracture    Frequent urinary tract infections    GERD (gastroesophageal reflux disease)    History of hand surgery 2011   left hand   Hyperlipidemia, unspecified    Irritable bowel syndrome    MI (myocardial infarction) ([place]) 1994   Neuromuscular disorder ([place])    nerve pain in back and legs   Osteoporosis    Pneumonia 2014   Stroke ([place])  TIA (transient ischemic attack)    Vitamin D deficiency, unspecified    Wheezing      Patient Active Problem List   Diagnosis Date Noted   Moderate episode of recurrent major depressive disorder ([place]) 12/19/2017   Leg length discrepancy 09/21/2017   CAD (coronary artery disease), native coronary artery 01/24/2017   Spondylolisthesis of lumbar region 12/30/2016   Obese 04/16/2015   History of colon polyps 11/23/2012   Bundle branch block, 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   Personal history of transient ischemic attack (TIA), and cerebral infarction without residual deficits 11/23/2012   Absolute anemia 08/17/2012   Chronic obstructive  pulmonary disease ([place]) 08/17/2012   HLD (hyperlipidemia) 08/17/2012   OP (osteoporosis) 08/17/2012   Type 2 diabetes mellitus ([place]) [id number]   Chronic systolic heart failure ([place]) 07/30/2012    Past Surgical History:  Procedure Laterality Date   CARDIAC CATHETERIZATION  1994   COLONOSCOPY     x3   CORONARY ANGIOPLASTY  1994   EYE SURGERY     cataracts   HAND SURGERY Left 2011   left hand release of contractures   HIP FRACTURE SURGERY  11/2013   PR COLSC FLX W/REMOVAL LESION BY HOT BX FORCEPS   08/21/2015   Procedure: COLONOSCOPY, FLEXIBLE, PROXIMAL TO SPLENIC FLEXURE; W/REMOVAL TUMOR/POLYP/OTHER LESION, HOT BX FORCEP/CAUTE; Surgeon: Carlena Hurl, MD; Location: OR [facility]; Service: General Surgery    Her family history includes AAA (abdominal aortic aneurysm) in her daughter; Anxiety disorder in her daughter; Arthritis in her daughter; Asthma in her brother; Cataracts in her mother; Depression in her daughter and mother; Diabetes in her daughter; Heart disease in her mother; Hyperlipidemia in her daughter and daughter; Hypertension in her daughter and mother; Plantar fasciitis in her daughter; Stroke in her mother.   Current Outpatient Medications:    albuterol (VENTOLIN HFA) 108 (90 Base) MCG/ACT inhaler, Inhale 2 puffs into the lungs every 6 (six) hours as needed for wheezing or shortness of breath., Disp: 18 g, Rfl: 1   Alcohol Swabs (ALCOHOL PREP) PADS, Use twice daily prior to SQ injection of insulin to clean skin, Disp: 100 each, Rfl: 6   aspirin EC 81 MG tablet, Take 81 mg daily by mouth., Disp: , Rfl:    atorvastatin (LIPITOR) 80 MG tablet, TAKE 1 TABLET BY MOUTH EVERY DAY, Disp: 30 tablet, Rfl: 5   CVS CALCIUM 600+D 600-800 MG-UNIT TABS, TAKE 1 TABLET BY MOUTH TWICE DAILY, Disp: 120 tablet, Rfl: 2   Cyanocobalamin (B-12) 1000 MCG CAPS, Take 1,000 mcg by mouth daily. , Disp: , Rfl:    HYDROcodone-acetaminophen (NORCO) 10-325 MG tablet, Take 1  tablet by mouth every 8 (eight) hours as needed., Disp: 90 tablet, Rfl: 0   Insulin Syringe-Needle U-100 29G 1 ML MISC, Use to inject 65 units Lantus SQ BIDWC, Disp: 100 each, Rfl: 6   LANTUS 100 UNIT/ML injection, INJECT 65 UNITS INTO THE SKIN TWICE DAILY WITH A MEAL, Disp: 10 mL, Rfl: 99   losartan (COZAAR) 25 MG tablet, TAKE 1 TABLET BY MOUTH EVERY DAY, Disp: 90 tablet, Rfl: 1   metoprolol succinate (TOPROL-XL) 25 MG 24 hr tablet, Take 1 tablet (25 mg total) by mouth daily., Disp: 90 tablet, Rfl: 3   Multiple Vitamins-Minerals (ICAPS AREDS 2 PO), Take 1 capsule by mouth 2 (two) times daily. , Disp: , Rfl:    nitrofurantoin (MACRODANTIN) 100 MG capsule, Take 100 mg at bedtime by mouth., Disp: , Rfl:    Omega-3 Fatty Acids (FISH OIL) 1200 MG  CAPS, Take 1 capsule (1,200 mg total) by mouth daily., Disp: 90 capsule, Rfl: 3   omeprazole (PRILOSEC) 40 MG capsule, Take 1 capsule (40 mg total) by mouth daily., Disp: 90 capsule, Rfl: 3   ONETOUCH DELICA LANCETS 99991111 MISC, TO CHECK BLOOD SUGAR DAILY, Disp: 100 each, Rfl: 5   ONETOUCH VERIO test strip, TO CHECK BLOOD SUGAR ONCE DAILY., Disp: 100 strip, Rfl: 3   oxybutynin (DITROPAN-XL) 10 MG 24 hr tablet, TAKE 1 TABLET BY MOUTH EVERYDAY AT BEDTIME, Disp: 90 tablet, Rfl: 1   sertraline (ZOLOFT) 25 MG tablet, Take 1 tablet (25 mg total) by mouth at bedtime., Disp: 90 tablet, Rfl: 1   TRULICITY 1.5 0000000 SOPN, INJECT 1.5MG  INTO THE SKIN ONCE A WEEK AS DIRECTED, Disp: 15 mL, Rfl: 11   Vitamin D, Ergocalciferol, (DRISDOL) 1.25 MG (50000 UT) CAPS capsule, TAKE ONE CAPSULE BY MOUTH ONCE WEEKLY ON THE SAME DAY EACH WEEK (ON FRIDAYS), Disp: 12 capsule, Rfl: 1  Patient Care Team: Mar Daring, PA-C as PCP - General (Family Medicine) Dingeldein, Remo Lipps, MD as Consulting Physician (Ophthalmology) Newman Pies, MD as Consulting Physician (Neurosurgery) Minna Merritts, MD as Consulting Physician (Cardiology)    Objective:    Vitals:  BP 103/67 (BP Location: Left Arm, Patient Position: Sitting, Cuff Size: Large)    Pulse 82    Temp (!) 97.5 F (36.4 C) (Other (Comment))    Resp 16    Ht 5' (1.524 m)    Wt 154 lb 12.8 oz (70.2 kg)    BMI 30.23 kg/m   Physical Exam Vitals signs reviewed.  Constitutional:      General: She is not in acute distress.    Appearance: Normal appearance. She is well-developed. She is obese. She is not ill-appearing or diaphoretic.  HENT:     Head: Normocephalic and atraumatic.     Right Ear: Tympanic membrane, ear canal and external ear normal.     Left Ear: Tympanic membrane, ear canal and external ear normal.     Nose: Nose normal.     Mouth/Throat:     Mouth: Mucous membranes are moist.     Pharynx: Oropharynx is clear. No oropharyngeal exudate.  Eyes:     General: No scleral icterus.       Right eye: No discharge.        Left eye: No discharge.     Extraocular Movements: Extraocular movements intact.     Conjunctiva/sclera: Conjunctivae normal.     Pupils: Pupils are equal, round, and reactive to light.  Neck:     Musculoskeletal: Normal range of motion and neck supple.     Thyroid: No thyromegaly.     Vascular: No carotid bruit or JVD.     Trachea: No tracheal deviation.  Cardiovascular:     Rate and Rhythm: Normal rate and regular rhythm.     Pulses: Normal pulses.     Heart sounds: Normal heart sounds. No murmur. No friction rub. No gallop.   Pulmonary:     Effort: Pulmonary effort is normal. No respiratory distress.     Breath sounds: Normal breath sounds. No wheezing or rales.  Chest:     Chest wall: No tenderness.  Abdominal:     General: Abdomen is protuberant. Bowel sounds are normal. There is no distension.     Palpations: Abdomen is soft. There is no mass.     Tenderness: There is no abdominal tenderness. There is no guarding or rebound.  Musculoskeletal: Normal  range of motion.        General: No tenderness.     Right lower leg: No edema.     Left lower leg: No  edema.  Lymphadenopathy:     Cervical: No cervical adenopathy.  Skin:    General: Skin is warm and dry.     Capillary Refill: Capillary refill takes less than 2 seconds.     Findings: No rash.  Neurological:     General: No focal deficit present.     Mental Status: She is alert and oriented to person, place, and time. Mental status is at baseline.     Gait: Gait abnormal (walks with walker).  Psychiatric:        Mood and Affect: Mood normal.        Behavior: Behavior normal.        Thought Content: Thought content normal.        Judgment: Judgment normal.     Activities of Daily Living In your present state of health, do you have any difficulty performing the following activities: 12/28/2018  Hearing? N  Vision? N  Difficulty concentrating or making decisions? Y  Walking or climbing stairs? Y  Dressing or bathing? N  Doing errands, shopping? Y  Some recent data might be hidden    Fall Risk Assessment Fall Risk  12/28/2018 12/19/2017 12/16/2016 11/11/2015  Falls in the past year? 0 No No No  Number falls in past yr: 0 - - -  Injury with Fall? 0 - - -     Depression Screen PHQ 2/9 Scores 12/28/2018 12/19/2017 12/16/2016 12/16/2016  PHQ - 2 Score 0 0 0 0  PHQ- 9 Score 0 - 0 0    6CIT Screen 12/28/2018  What Year? 0 points  What month? 0 points  What time? 0 points  Count back from 20 0 points  Months in reverse 0 points  Repeat phrase 2 points  Total Score 2      Assessment & Plan:     Annual Wellness Visit  Reviewed patient's Family Medical History Reviewed and updated list of patient's medical providers Assessment of cognitive impairment was done Assessed patient's functional ability Established a written schedule for health screening services Health Risk Assessent Completed and Reviewed  Exercise Activities and Dietary recommendations Goals     Decrease soda or juice intake     Recommend decreasing caffeine by 2 glasses a dayand drinking 3 glasses  of water a day as a subsitute.     Have 3 meals a day     Recommend to decrease portion sizes by eating 3 small healthy meals and at least 2 healthy snacks per day.         Immunization History  Administered Date(s) Administered   Influenza, High Dose Seasonal PF 02/12/2016, 12/16/2016, 12/19/2017   Zoster Recombinat (Shingrix) 05/07/2018, 11/21/2018    Health Maintenance  Topic Date Due   TETANUS/TDAP  10/02/1964   FOOT EXAM  12/16/2017   COLONOSCOPY  08/21/2018   INFLUENZA VACCINE  10/07/2018   HEMOGLOBIN A1C  11/30/2018   MAMMOGRAM  04/13/2019   OPHTHALMOLOGY EXAM  09/07/2019   DEXA SCAN  03/07/2020   Hepatitis C Screening  Completed   PNA vac Low Risk Adult  Completed     Discussed health benefits of physical activity, and encouraged her to engage in regular exercise appropriate for her age and condition.    1. Annual physical exam Normal physical exam today. Will check labs as below  and f/u pending lab results. If labs are stable and WNL she will not need to have these rechecked for one year at her next annual physical exam. She is to call the office in the meantime if she has any acute issue, questions or concerns. - CBC with Differential/Platelet - Comprehensive metabolic panel - Hemoglobin A1c - Lipid panel - TSH  2. Encounter for breast cancer screening using non-mammogram modality There is no family history of breast cancer. She does perform regular self breast exams. Mammogram was ordered as below. Information for [facility] Breast clinic was given to patient so she may schedule her mammogram at her convenience. - MM 3D SCREEN BREAST BILATERAL; Future  3. Colon cancer screening Had colonoscopy in 2017 and had tubular adenomas that require 3 yr f/u. Referral placed for colonoscopy.  - Ambulatory referral to Gastroenterology  4. Coronary artery disease of native artery of native heart with stable angina pectoris (HCC) Stable. Followed by  Cardiology. Will check labs as below and f/u pending results. - CBC with Differential/Platelet - Comprehensive metabolic panel - Lipid panel  5. Simple chronic bronchitis (HCC) Stable. Currently asymptomatic. Continue to not smoke.   6. Type 2 diabetes mellitus with diabetic peripheral angiopathy without gangrene, with long-term current use of insulin (HCC) Stable. Continue lantus 65 units daily, trulicity 1.5mg /0.64mL. Continue lifestyle management. Will check labs as below and f/u pending results. - CBC with Differential/Platelet - Comprehensive metabolic panel - Hemoglobin A1c - Lipid panel - Ambulatory referral to Vascular Surgery  7. Mixed hyperlipidemia Stable. Continue Atorvastatin 80mg . Will check labs as below and f/u pending results. - CBC with Differential/Platelet - Comprehensive metabolic panel - Lipid panel  8. Chronic systolic heart failure (HCC) Stable. Continue metoprolol 25mg  daily, losartan 25mg  daily.   9. Recurrent major depressive disorder, in full remission ([place]) Stable. Continue Sertraline 25mg  at bedtime.   10. PAD (peripheral artery disease) ([place]) New found in right leg. Patient is currently asymptomatic. Denies claudication or rest pain. Outside ABI was found to be 0.75 in right leg, left leg was normal (1.26). Referral to vascular surgery for further evaluation and monitoring if required.  - Ambulatory referral to Vascular Surgery  11. 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 edema, with long-term current use of insulin (HCC) Stable. See medical plan for #6.  12. Need for influenza vaccination Patient declined.  ------------------------------------------------------------------------------------------------------------    Mar Daring, PA-C  [facility]

## 2018-12-28 ENCOUNTER — Encounter: Payer: Self-pay | Admitting: Physician Assistant

## 2018-12-28 ENCOUNTER — Other Ambulatory Visit: Payer: Self-pay

## 2018-12-28 ENCOUNTER — Ambulatory Visit (INDEPENDENT_AMBULATORY_CARE_PROVIDER_SITE_OTHER): Payer: Medicare Other | Admitting: Physician Assistant

## 2018-12-28 ENCOUNTER — Other Ambulatory Visit: Payer: Self-pay | Admitting: Physician Assistant

## 2018-12-28 VITALS — BP 103/67 | HR 82 | Temp 97.5°F | Resp 16 | Ht 60.0 in | Wt 154.8 lb

## 2018-12-28 DIAGNOSIS — Z1239 Encounter for other screening for malignant neoplasm of breast: Secondary | ICD-10-CM

## 2018-12-28 DIAGNOSIS — Z1211 Encounter for screening for malignant neoplasm of colon: Secondary | ICD-10-CM | POA: Diagnosis not present

## 2018-12-28 DIAGNOSIS — I739 Peripheral vascular disease, unspecified: Secondary | ICD-10-CM | POA: Insufficient documentation

## 2018-12-28 DIAGNOSIS — E1151 Type 2 diabetes mellitus with diabetic peripheral angiopathy without gangrene: Secondary | ICD-10-CM

## 2018-12-28 DIAGNOSIS — I25118 Atherosclerotic heart disease of native coronary artery with other forms of angina pectoris: Secondary | ICD-10-CM | POA: Diagnosis not present

## 2018-12-28 DIAGNOSIS — F3342 Major depressive disorder, recurrent, in full remission: Secondary | ICD-10-CM

## 2018-12-28 DIAGNOSIS — Z0000 Encounter for general adult medical examination without abnormal findings: Secondary | ICD-10-CM | POA: Diagnosis not present

## 2018-12-28 DIAGNOSIS — E1142 Type 2 diabetes mellitus with diabetic polyneuropathy: Secondary | ICD-10-CM

## 2018-12-28 DIAGNOSIS — Z23 Encounter for immunization: Secondary | ICD-10-CM

## 2018-12-28 DIAGNOSIS — Z794 Long term (current) use of insulin: Secondary | ICD-10-CM

## 2018-12-28 DIAGNOSIS — E113293 Type 2 diabetes mellitus with mild nonproliferative diabetic retinopathy without macular edema, bilateral: Secondary | ICD-10-CM

## 2018-12-28 DIAGNOSIS — J410 Simple chronic bronchitis: Secondary | ICD-10-CM

## 2018-12-28 DIAGNOSIS — E782 Mixed hyperlipidemia: Secondary | ICD-10-CM

## 2018-12-28 DIAGNOSIS — N3281 Overactive bladder: Secondary | ICD-10-CM

## 2018-12-28 DIAGNOSIS — I5022 Chronic systolic (congestive) heart failure: Secondary | ICD-10-CM

## 2018-12-29 ENCOUNTER — Telehealth: Payer: Self-pay

## 2018-12-29 LAB — COMPREHENSIVE METABOLIC PANEL
ALT: 14 IU/L (ref 0–32)
AST: 15 IU/L (ref 0–40)
Albumin/Globulin Ratio: 1.7 (ref 1.2–2.2)
Albumin: 4.3 g/dL (ref 3.7–4.7)
Alkaline Phosphatase: 118 IU/L — ABNORMAL HIGH (ref 39–117)
BUN/Creatinine Ratio: 26 (ref 12–28)
BUN: 27 mg/dL (ref 8–27)
Bilirubin Total: 0.9 mg/dL (ref 0.0–1.2)
CO2: 23 mmol/L (ref 20–29)
Calcium: 10.5 mg/dL — ABNORMAL HIGH (ref 8.7–10.3)
Chloride: 103 mmol/L (ref 96–106)
Creatinine, Ser: 1.02 mg/dL — ABNORMAL HIGH (ref 0.57–1.00)
GFR calc Af Amer: 63 mL/min/{1.73_m2} (ref >59)
GFR calc non Af Amer: 55 mL/min/{1.73_m2} — ABNORMAL LOW (ref >59)
Globulin, Total: 2.6 g/dL (ref 1.5–4.5)
Glucose: 91 mg/dL (ref 65–99)
Potassium: 4.3 mmol/L (ref 3.5–5.2)
Sodium: 140 mmol/L (ref 134–144)
Total Protein: 6.9 g/dL (ref 6.0–8.5)

## 2018-12-29 LAB — CBC WITH DIFFERENTIAL/PLATELET
Basophils Absolute: 0 10*3/uL (ref 0.0–0.2)
Basos: 1 %
EOS (ABSOLUTE): 0.2 10*3/uL (ref 0.0–0.4)
Eos: 2 %
Hematocrit: 38.7 % (ref 34.0–46.6)
Hemoglobin: 13.1 g/dL (ref 11.1–15.9)
Immature Grans (Abs): 0 10*3/uL (ref 0.0–0.1)
Immature Granulocytes: 0 %
Lymphocytes Absolute: 2.9 10*3/uL (ref 0.7–3.1)
Lymphs: 33 %
MCH: 30.5 pg (ref 26.6–33.0)
MCHC: 33.9 g/dL (ref 31.5–35.7)
MCV: 90 fL (ref 79–97)
Monocytes Absolute: 0.6 10*3/uL (ref 0.1–0.9)
Monocytes: 7 %
Neutrophils Absolute: 5.1 10*3/uL (ref 1.4–7.0)
Neutrophils: 57 %
Platelets: 170 10*3/uL (ref 150–450)
RBC: 4.29 x10E6/uL (ref 3.77–5.28)
RDW: 13.2 % (ref 11.7–15.4)
WBC: 8.8 10*3/uL (ref 3.4–10.8)

## 2018-12-29 LAB — LIPID PANEL
Chol/HDL Ratio: 1.9 ratio (ref 0.0–4.4)
Cholesterol, Total: 123 mg/dL (ref 100–199)
HDL: 64 mg/dL (ref >39)
LDL Chol Calc (NIH): 39 mg/dL (ref 0–99)
Triglycerides: 115 mg/dL (ref 0–149)
VLDL Cholesterol Cal: 20 mg/dL (ref 5–40)

## 2018-12-29 LAB — TSH: TSH: 1.22 u[IU]/mL (ref 0.450–4.500)

## 2018-12-29 LAB — HEMOGLOBIN A1C
Est. average glucose Bld gHb Est-mCnc: 171 mg/dL
Hgb A1c MFr Bld: 7.6 % — ABNORMAL HIGH (ref 4.8–5.6)

## 2018-12-29 NOTE — Telephone Encounter (Signed)
-----   Message from Mar Daring, [place] sent at 12/29/2018  5:05 PM EDT ----- Blood count is normal. Kidney function is stable. Liver enzymes are normal. Sodium and potassium are normal. Calcium is just very borderline high, so may can cut back to one calcium daily instead of 2 or do just one multivitamin like we discussed. A1c is also doing GREAT! It is down to 7.6. This is fantastic! Keep up all the good work. Because it is better controlled does not mean to start slacking now on those things you have been doing because they are working. Cholesterol is great. Thyroid is normal.

## 2018-12-29 NOTE — Telephone Encounter (Signed)
Patient is very excited about this and will try to get better or maintain. She is also requesting that we mail the AVS to her as she did not get one when she left yesterday. Ok to do this?

## 2018-12-30 ENCOUNTER — Encounter: Payer: Self-pay | Admitting: Physician Assistant

## 2018-12-30 MED ORDER — INSULIN GLARGINE 100 UNIT/ML ~~LOC~~ SOLN: 65.0000 [IU] | Freq: Every day | SUBCUTANEOUS | 99 refills | Status: DC

## 2019-01-01 NOTE — Telephone Encounter (Signed)
AVS has been mailed to the patient.

## 2019-01-01 NOTE — Telephone Encounter (Signed)
Yes I guess so

## 2019-01-09 ENCOUNTER — Encounter: Payer: Self-pay | Admitting: *Deleted

## 2019-01-10 ENCOUNTER — Telehealth: Payer: Self-pay

## 2019-01-10 ENCOUNTER — Other Ambulatory Visit: Payer: Self-pay

## 2019-01-10 DIAGNOSIS — Z86010 Personal history of colonic polyps: Secondary | ICD-10-CM

## 2019-01-10 DIAGNOSIS — Z1211 Encounter for screening for malignant neoplasm of colon: Secondary | ICD-10-CM

## 2019-01-10 NOTE — Telephone Encounter (Signed)
Gastroenterology Pre-Procedure Review  Request Date: Friday 02/08/19 Requesting Physician: Dr. Vicente Males  PATIENT REVIEW QUESTIONS: The patient responded to the following health history questions as indicated:    1. Are you having any GI issues? no 2. Do you have a personal history of Polyps? yes (2017 colon polyps) 3. Do you have a family history of Colon Cancer or Polyps? no 4. Diabetes Mellitus?yes 5. Joint replacements in the past 12 months?no 6. Major health problems in the past 3 months?no 7. Any artificial heart valves, MVP, or defibrillator?no    MEDICATIONS & ALLERGIES:    Patient reports the following regarding taking any anticoagulation/antiplatelet therapy:   Plavix, Coumadin, Eliquis, Xarelto, Lovenox, Pradaxa, Brilinta, or Effient? no Aspirin? yes (81 mg daily)  Patient confirms/reports the following medications:  Current Outpatient Medications  Medication Sig Dispense Refill  . albuterol (VENTOLIN HFA) 108 (90 Base) MCG/ACT inhaler Inhale 2 puffs into the lungs every 6 (six) hours as needed for wheezing or shortness of breath. 18 g 1  . Alcohol Swabs (ALCOHOL PREP) PADS Use twice daily prior to SQ injection of insulin to clean skin 100 each 6  . aspirin EC 81 MG tablet Take 81 mg daily by mouth.    [name] Kitchen atorvastatin (LIPITOR) 80 MG tablet TAKE 1 TABLET BY MOUTH EVERY DAY 30 tablet 5  . CVS CALCIUM 600+D 600-800 MG-UNIT TABS TAKE 1 TABLET BY MOUTH TWICE DAILY 120 tablet 2  . Cyanocobalamin (B-12) 1000 MCG CAPS Take 1,000 mcg by mouth daily.     [name] Kitchen HYDROcodone-acetaminophen (NORCO) 10-325 MG tablet Take 1 tablet by mouth every 8 (eight) hours as needed. 90 tablet 0  . insulin glargine (LANTUS) 100 UNIT/ML injection Inject 0.65 mLs (65 Units total) into the skin daily with supper. 10 mL 99  . Insulin Syringe-Needle U-100 29G 1 ML MISC Use to inject 65 units Lantus SQ BIDWC 100 each 6  . losartan (COZAAR) 25 MG tablet TAKE 1 TABLET BY MOUTH EVERY DAY 90 tablet 1  . metoprolol  succinate (TOPROL-XL) 25 MG 24 hr tablet Take 1 tablet (25 mg total) by mouth daily. 90 tablet 3  . Multiple Vitamins-Minerals (ICAPS AREDS 2 PO) Take 1 capsule by mouth 2 (two) times daily.     . nitrofurantoin (MACRODANTIN) 100 MG capsule Take 100 mg at bedtime by mouth.    . Omega-3 Fatty Acids (FISH OIL) 1200 MG CAPS Take 1 capsule (1,200 mg total) by mouth daily. 90 capsule 3  . omeprazole (PRILOSEC) 40 MG capsule Take 1 capsule (40 mg total) by mouth daily. 90 capsule 3  . ONETOUCH DELICA LANCETS 99991111 MISC TO CHECK BLOOD SUGAR DAILY 100 each 5  . ONETOUCH VERIO test strip TO CHECK BLOOD SUGAR ONCE DAILY. 100 strip 3  . oxybutynin (DITROPAN-XL) 10 MG 24 hr tablet TAKE 1 TABLET BY MOUTH EVERYDAY AT BEDTIME 90 tablet 1  . sertraline (ZOLOFT) 25 MG tablet Take 1 tablet (25 mg total) by mouth at bedtime. 90 tablet 1  . TRULICITY 1.5 0000000 SOPN INJECT 1.5MG  INTO THE SKIN ONCE A WEEK AS DIRECTED 15 mL 11  . Vitamin D, Ergocalciferol, (DRISDOL) 1.25 MG (50000 UT) CAPS capsule TAKE ONE CAPSULE BY MOUTH ONCE WEEKLY ON THE SAME DAY EACH WEEK (ON FRIDAYS) 12 capsule 1   No current facility-administered medications for this visit.     Patient confirms/reports the following allergies:  Allergies  Allergen Reactions  . Metformin And Related Diarrhea  . Sulfa Antibiotics Rash    No orders  of the defined types were placed in this encounter.   AUTHORIZATION INFORMATION Primary Insurance: 1D#: Group #:  Secondary Insurance: 1D#: Group #:  SCHEDULE INFORMATION: Date: 02/08/19 Time: Location:[facility]

## 2019-01-16 ENCOUNTER — Other Ambulatory Visit: Payer: Self-pay | Admitting: Physician Assistant

## 2019-01-16 ENCOUNTER — Encounter (INDEPENDENT_AMBULATORY_CARE_PROVIDER_SITE_OTHER): Payer: Self-pay | Admitting: Vascular Surgery

## 2019-01-16 ENCOUNTER — Ambulatory Visit (INDEPENDENT_AMBULATORY_CARE_PROVIDER_SITE_OTHER): Payer: Medicare Other | Admitting: Vascular Surgery

## 2019-01-16 ENCOUNTER — Other Ambulatory Visit: Payer: Self-pay

## 2019-01-16 VITALS — BP 105/62 | HR 93 | Resp 16 | Wt 155.8 lb

## 2019-01-16 DIAGNOSIS — E113293 Type 2 diabetes mellitus with mild nonproliferative diabetic retinopathy without macular edema, bilateral: Secondary | ICD-10-CM

## 2019-01-16 DIAGNOSIS — I25118 Atherosclerotic heart disease of native coronary artery with other forms of angina pectoris: Secondary | ICD-10-CM | POA: Diagnosis not present

## 2019-01-16 DIAGNOSIS — E782 Mixed hyperlipidemia: Secondary | ICD-10-CM

## 2019-01-16 DIAGNOSIS — M4316 Spondylolisthesis, lumbar region: Secondary | ICD-10-CM

## 2019-01-16 DIAGNOSIS — Z794 Long term (current) use of insulin: Secondary | ICD-10-CM

## 2019-01-16 DIAGNOSIS — I739 Peripheral vascular disease, unspecified: Secondary | ICD-10-CM

## 2019-01-16 MED ORDER — HYDROCODONE-ACETAMINOPHEN 10-325 MG PO TABS: 1.0000 | ORAL_TABLET | Freq: Three times a day (TID) | ORAL | 0 refills | Status: DC | PRN

## 2019-01-16 NOTE — Patient Instructions (Signed)

## 2019-01-16 NOTE — Assessment & Plan Note (Signed)
lipid control important in reducing the progression of atherosclerotic disease. Continue statin therapy  

## 2019-01-16 NOTE — Progress Notes (Signed)
Patient ID: Andrea [name], female   DOB: 05-Oct-1945, 73 y.o.   MRN: [id number]  Chief Complaint  Patient presents with  . New Patient (Initial Visit)    ref Marlyn Corporal PAD    HPI Andrea [name] is a 73 y.o. female.  I am asked to see the patient by Joette Catching, PA-C for evaluation of PAD.  The patient is undergone recent noninvasive studies by her primary care provider which demonstrated moderately reduced right ABI of 0.75 range and a normal left ABI of 1.2.  She says she actually has a lot more trouble with her left leg.  This is the leg where she had the hip fracture and subsequent surgery with shortening of that leg.  She admits to not really being able to walk enough to determine true claudication.  She describes no ischemic rest pain or ulceration.  She has a significant history of atherosclerotic disease with multiple previous coronary interventions.  She has multiple atherosclerotic risk factors.  No current fevers or chills.  No ulcerations or infections of the lower extremity   Past Medical History:  Diagnosis Date  . Bronchitis   . Chickenpox   . Coronary artery disease   . Depression   . Diabetes mellitus type 2, uncomplicated ([place])   . Diabetes mellitus without complication ([place])   . Diabetic retinopathy ([place])   . Dupuytren contracture   . Frequent urinary tract infections   . GERD (gastroesophageal reflux disease)   . History of hand surgery 2011   left hand  . Hyperlipidemia, unspecified   . Irritable bowel syndrome   . MI (myocardial infarction) ([place]) 1994  . Neuromuscular disorder ([place])    nerve pain in back and legs  . Osteoporosis   . Pneumonia 2014  . Stroke ([place])   . TIA (transient ischemic attack)   . Vitamin D deficiency, unspecified   . Wheezing     Past Surgical History:  Procedure Laterality Date  . CARDIAC CATHETERIZATION  1994  . COLONOSCOPY     x3  . CORONARY ANGIOPLASTY  1994  . EYE SURGERY     cataracts  . HAND SURGERY Left 2011   left hand release of contractures  . HIP FRACTURE SURGERY  11/2013  . PR COLSC FLX W/REMOVAL LESION BY HOT BX FORCEPS   08/21/2015   Procedure: COLONOSCOPY, FLEXIBLE, PROXIMAL TO SPLENIC FLEXURE; W/REMOVAL TUMOR/POLYP/OTHER LESION, HOT BX FORCEP/CAUTE; Surgeon: Carlena Hurl, MD; Location: OR [facility]; Service: General Surgery     Family History  Problem Relation Age of Onset  . Depression Daughter   . Arthritis Daughter        spine  . Plantar fasciitis Daughter   . Anxiety disorder Daughter   . Hyperlipidemia Daughter   . AAA (abdominal aortic aneurysm) Daughter   . Hypertension Mother   . Stroke Mother   . Cataracts Mother   . Depression Mother   . Heart disease Mother   . Asthma Brother   . Diabetes Daughter   . Hyperlipidemia Daughter   . Hypertension Daughter     Social History   Tobacco Use  . Smoking status: Former Smoker    Packs/day: 1.00    Years: 30.00    Pack years: 30.00    Types: Cigarettes    Quit date: 07/17/2012    Years since quitting: 6.5  . Smokeless tobacco: Never Used  . Tobacco comment:    Substance Use Topics  . Alcohol use: Not Currently  Alcohol/week: 0.0 standard drinks  . Drug use: No     Allergies  Allergen Reactions  . Metformin And Related Diarrhea  . Sulfa Antibiotics Rash    Current Outpatient Medications  Medication Sig Dispense Refill  . albuterol (VENTOLIN HFA) 108 (90 Base) MCG/ACT inhaler Inhale 2 puffs into the lungs every 6 (six) hours as needed for wheezing or shortness of breath. 18 g 1  . Alcohol Swabs (ALCOHOL PREP) PADS Use twice daily prior to SQ injection of insulin to clean skin 100 each 6  . aspirin EC 81 MG tablet Take 81 mg daily by mouth.    [name] Kitchen atorvastatin (LIPITOR) 80 MG tablet TAKE 1 TABLET BY MOUTH EVERY DAY 30 tablet 5  . [facility] CALCIUM 600+D 600-800 MG-UNIT TABS TAKE 1 TABLET BY MOUTH TWICE DAILY 120 tablet 2  . Cyanocobalamin (B-12) 1000 MCG CAPS Take 1,000 mcg by mouth daily.     [name] Kitchen  HYDROcodone-acetaminophen (NORCO) 10-325 MG tablet Take 1 tablet by mouth every 8 (eight) hours as needed. 90 tablet 0  . insulin glargine (LANTUS) 100 UNIT/ML injection Inject 0.65 mLs (65 Units total) into the skin daily with supper. 10 mL 99  . Insulin Syringe-Needle U-100 29G 1 ML MISC Use to inject 65 units Lantus SQ BIDWC 100 each 6  . losartan (COZAAR) 25 MG tablet TAKE 1 TABLET BY MOUTH EVERY DAY 90 tablet 1  . metoprolol succinate (TOPROL-XL) 25 MG 24 hr tablet Take 1 tablet (25 mg total) by mouth daily. 90 tablet 3  . Multiple Vitamins-Minerals (ICAPS AREDS 2 PO) Take 1 capsule by mouth 2 (two) times daily.     . nitrofurantoin (MACRODANTIN) 100 MG capsule Take 100 mg at bedtime by mouth.    . Omega-3 Fatty Acids (FISH OIL) 1200 MG CAPS Take 1 capsule (1,200 mg total) by mouth daily. 90 capsule 3  . omeprazole (PRILOSEC) 40 MG capsule Take 1 capsule (40 mg total) by mouth daily. 90 capsule 3  . ONETOUCH DELICA LANCETS 99991111 MISC TO CHECK BLOOD SUGAR DAILY 100 each 5  . ONETOUCH VERIO test strip TO CHECK BLOOD SUGAR ONCE DAILY. 100 strip 3  . oxybutynin (DITROPAN-XL) 10 MG 24 hr tablet TAKE 1 TABLET BY MOUTH EVERYDAY AT BEDTIME 90 tablet 1  . sertraline (ZOLOFT) 25 MG tablet Take 1 tablet (25 mg total) by mouth at bedtime. 90 tablet 1  . TRULICITY 1.5 0000000 SOPN INJECT 1.5MG  INTO THE SKIN ONCE A WEEK AS DIRECTED 15 mL 11  . Vitamin D, Ergocalciferol, (DRISDOL) 1.25 MG (50000 UT) CAPS capsule TAKE ONE CAPSULE BY MOUTH ONCE WEEKLY ON THE SAME DAY EACH WEEK (ON FRIDAYS) 12 capsule 1   No current facility-administered medications for this visit.       REVIEW OF SYSTEMS (Negative unless checked)  Constitutional: [] Weight loss  [] Fever  [] Chills Cardiac: [] Chest pain   [] Chest pressure   [] Palpitations   [] Shortness of breath when laying flat   [] Shortness of breath at rest   [x] Shortness of breath with exertion. Vascular:  [x] Pain in legs with walking   [x] Pain in legs at rest   [x] Pain  in legs when laying flat   [] Claudication   [] Pain in feet when walking  [] Pain in feet at rest  [] Pain in feet when laying flat   [] History of DVT   [] Phlebitis   [] Swelling in legs   [] Varicose veins   [] Non-healing ulcers Pulmonary:   [] Uses home oxygen   [] Productive cough   [] Hemoptysis   []   Wheeze  [x] COPD   [x] Asthma Neurologic:  [] Dizziness  [] Blackouts   [] Seizures   [] History of stroke   [] History of TIA  [] Aphasia   [] Temporary blindness   [] Dysphagia   [] Weakness or numbness in arms   [] Weakness or numbness in legs Musculoskeletal:  [x] Arthritis   [] Joint swelling   [x] Joint pain   [] Low back pain Hematologic:  [] Easy bruising  [] Easy bleeding   [] Hypercoagulable state   [] Anemic  [] Hepatitis Gastrointestinal:  [] Blood in stool   [] Vomiting blood  [x] Gastroesophageal reflux/heartburn   [] Abdominal pain Genitourinary:  [] Chronic kidney disease   [] Difficult urination  [] Frequent urination  [] Burning with urination   [] Hematuria Skin:  [] Rashes   [] Ulcers   [] Wounds Psychological:  [] History of anxiety   []  History of major depression.    Physical Exam BP 105/62 (BP Location: Right Arm)   Pulse 93   Resp 16   Wt 155 lb 12.8 oz (70.7 kg)   BMI 30.43 kg/m  Gen:  WD/WN, NAD Head: [place]/AT, No temporalis wasting.  Ear/Nose/Throat: Hearing grossly intact, nares w/o erythema or drainage, oropharynx w/o Erythema/Exudate Eyes: Conjunctiva clear, sclera non-icteric  Neck: trachea midline.  No JVD.  Pulmonary:  Good air movement, respirations not labored, no use of accessory muscles  Cardiac: RRR, no JVD Vascular:  Vessel Right Left  Radial Palpable Palpable                          DP  1+  2+  PT  1+  1+   Gastrointestinal:. No masses, surgical incisions, or scars. Musculoskeletal: M/S 5/5 throughout.  Extremities without ischemic changes.  No deformity or atrophy.  Walks with a walker.  Left leg is noticeably shorter than the right.  No significant lower extremity edema.  Neurologic: Sensation grossly intact in extremities.  Symmetrical.  Speech is fluent. Motor exam as listed above. Psychiatric: Judgment intact, Mood & affect appropriate for pt's clinical situation. Dermatologic: No rashes or ulcers noted.  No cellulitis or open wounds.    Radiology No results found.  Labs Recent Results (from the past 2160 hour(s))  ABI     Status: None   Collection Time: 11/09/18 10:24 AM  Result Value Ref Range   Right ABI     Left ABI    CBC with Differential/Platelet     Status: None   Collection Time: 12/28/18  4:26 PM  Result Value Ref Range   WBC 8.8 3.4 - 10.8 x10E3/uL   RBC 4.29 3.77 - 5.28 x10E6/uL   Hemoglobin 13.1 11.1 - 15.9 g/dL   Hematocrit 38.7 34.0 - 46.6 %   MCV 90 79 - 97 fL   MCH 30.5 26.6 - 33.0 pg   MCHC 33.9 31.5 - 35.7 g/dL   RDW 13.2 11.7 - 15.4 %   Platelets 170 150 - 450 x10E3/uL   Neutrophils 57 Not Estab. %   Lymphs 33 Not Estab. %   Monocytes 7 Not Estab. %   Eos 2 Not Estab. %   Basos 1 Not Estab. %   Neutrophils Absolute 5.1 1.4 - 7.0 x10E3/uL   Lymphocytes Absolute 2.9 0.7 - 3.1 x10E3/uL   Monocytes Absolute 0.6 0.1 - 0.9 x10E3/uL   EOS (ABSOLUTE) 0.2 0.0 - 0.4 x10E3/uL   Basophils Absolute 0.0 0.0 - 0.2 x10E3/uL   Immature Granulocytes 0 Not Estab. %   Immature Grans (Abs) 0.0 0.0 - 0.1 x10E3/uL  Comprehensive metabolic panel  Status: Abnormal   Collection Time: 12/28/18  4:26 PM  Result Value Ref Range   Glucose 91 65 - 99 mg/dL   BUN 27 8 - 27 mg/dL   Creatinine, Ser 1.02 (H) 0.57 - 1.00 mg/dL   GFR calc non Af Amer 55 (L) >59 mL/min/1.73   GFR calc Af Amer 63 >59 mL/min/1.73   BUN/Creatinine Ratio 26 12 - 28   Sodium 140 134 - 144 mmol/L   Potassium 4.3 3.5 - 5.2 mmol/L   Chloride 103 96 - 106 mmol/L   CO2 23 20 - 29 mmol/L   Calcium 10.5 (H) 8.7 - 10.3 mg/dL   Total Protein 6.9 6.0 - 8.5 g/dL   Albumin 4.3 3.7 - 4.7 g/dL   Globulin, Total 2.6 1.5 - 4.5 g/dL   Albumin/Globulin Ratio 1.7 1.2 - 2.2    Bilirubin Total 0.9 0.0 - 1.2 mg/dL   Alkaline Phosphatase 118 (H) 39 - 117 IU/L   AST 15 0 - 40 IU/L   ALT 14 0 - 32 IU/L  Hemoglobin A1c     Status: Abnormal   Collection Time: 12/28/18  4:26 PM  Result Value Ref Range   Hgb A1c MFr Bld 7.6 (H) 4.8 - 5.6 %    Comment:          Prediabetes: 5.7 - 6.4          Diabetes: >6.4          Glycemic control for adults with diabetes: <7.0    Est. average glucose Bld gHb Est-mCnc 171 mg/dL  Lipid panel     Status: None   Collection Time: 12/28/18  4:26 PM  Result Value Ref Range   Cholesterol, Total 123 100 - 199 mg/dL   Triglycerides 115 0 - 149 mg/dL   HDL 64 >39 mg/dL   VLDL Cholesterol Cal 20 5 - 40 mg/dL   LDL Chol Calc (NIH) 39 0 - 99 mg/dL   Chol/HDL Ratio 1.9 0.0 - 4.4 ratio    Comment:                                   T. Chol/HDL Ratio                                             Men  Women                               1/2 Avg.Risk  3.4    3.3                                   Avg.Risk  5.0    4.4                                2X Avg.Risk  9.6    7.1                                3X Avg.Risk 23.4   11.0   TSH     Status: None   Collection Time:  12/28/18  4:26 PM  Result Value Ref Range   TSH 1.220 0.450 - 4.500 uIU/mL    Assessment/Plan:  HLD (hyperlipidemia) lipid control important in reducing the progression of atherosclerotic disease. Continue statin therapy   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 edema, with long-term current use of insulin (HCC) blood glucose control important in reducing the progression of atherosclerotic disease. Also, involved in wound healing. On appropriate medications.   CAD (coronary artery disease), native coronary artery Continue cardiac and antihypertensive medications as already ordered and reviewed, no changes at this time. Continue statin as ordered and reviewed, no changes at this time Nitrates PRN for chest pain    PAD (peripheral artery disease) ([place]) The patient has undergone recent noninvasive studies by her primary care provider which demonstrated moderately reduced right ABI of 0.75 range and a normal left ABI of 1.2. At current, she does not have any limb threatening symptoms.  No ulceration or rest pain.  Given her limitations in walking due to her musculoskeletal issues, she is likely not having debilitating claudication at this time.  I will plan more formal noninvasive studies with duplex and ABIs at a follow-up visit in a couple of months.  She will contact our office with any problems in the interim.  I discussed the pathophysiology and natural history of peripheral arterial disease.  The patient voices her understanding and is agreeable with our plan of care.      Leotis Pain 01/16/2019, 3:25 PM   This note was created with Dragon medical transcription system.  Any errors from dictation are unintentional.

## 2019-01-16 NOTE — Telephone Encounter (Signed)
Pt needing refill on:  HYDROcodone-acetaminophen (NORCO) 10-325 MG tablet  Please fill at:  [id number] [place], [place] [phone] (Phone) [phone] (Fax)   Thanks, [facility]

## 2019-01-16 NOTE — Assessment & Plan Note (Signed)
The patient has undergone recent noninvasive studies by her primary care provider which demonstrated moderately reduced right ABI of 0.75 range and a normal left ABI of 1.2. At current, she does not have any limb threatening symptoms.  No ulceration or rest pain.  Given her limitations in walking due to her musculoskeletal issues, she is likely not having debilitating claudication at this time.  I will plan more formal noninvasive studies with duplex and ABIs at a follow-up visit in a couple of months.  She will contact our office with any problems in the interim.  I discussed the pathophysiology and natural history of peripheral arterial disease.  The patient voices her understanding and is agreeable with our plan of care.

## 2019-01-16 NOTE — Assessment & Plan Note (Signed)
Continue cardiac and antihypertensive medications as already ordered and reviewed, no changes at this time. Continue statin as ordered and reviewed, no changes at this time Nitrates PRN for chest pain  

## 2019-01-16 NOTE — Assessment & Plan Note (Signed)
blood glucose control important in reducing the progression of atherosclerotic disease. Also, involved in wound healing. On appropriate medications.  

## 2019-02-05 ENCOUNTER — Other Ambulatory Visit
Admission: RE | Admit: 2019-02-05 | Discharge: 2019-02-05 | Disposition: A | Discharge status: Home / Self Care | Payer: Medicare Other | Source: Ambulatory Visit | Attending: Gastroenterology | Admitting: Gastroenterology

## 2019-02-05 ENCOUNTER — Other Ambulatory Visit: Payer: Self-pay

## 2019-02-05 DIAGNOSIS — Z01812 Encounter for preprocedural laboratory examination: Secondary | ICD-10-CM | POA: Insufficient documentation

## 2019-02-05 DIAGNOSIS — Z20828 Contact with and (suspected) exposure to other viral communicable diseases: Secondary | ICD-10-CM | POA: Diagnosis not present

## 2019-02-06 LAB — SARS CORONAVIRUS 2 (TAT 6-24 HRS): SARS Coronavirus 2: NEGATIVE

## 2019-02-08 ENCOUNTER — Encounter
Admission: RE | Disposition: A | Discharge status: Home / Self Care | Payer: Self-pay | Source: Ambulatory Visit | Attending: Gastroenterology

## 2019-02-08 ENCOUNTER — Other Ambulatory Visit: Payer: Self-pay

## 2019-02-08 ENCOUNTER — Ambulatory Visit
Admission: RE | Admit: 2019-02-08 | Discharge: 2019-02-08 | Disposition: A | Discharge status: Home / Self Care | Payer: Medicare Other | Source: Ambulatory Visit | Attending: Gastroenterology | Admitting: Gastroenterology

## 2019-02-08 ENCOUNTER — Ambulatory Visit: Payer: Medicare Other | Admitting: Anesthesiology

## 2019-02-08 DIAGNOSIS — D122 Benign neoplasm of ascending colon: Secondary | ICD-10-CM | POA: Insufficient documentation

## 2019-02-08 DIAGNOSIS — Z1211 Encounter for screening for malignant neoplasm of colon: Secondary | ICD-10-CM | POA: Diagnosis not present

## 2019-02-08 DIAGNOSIS — Z882 Allergy status to sulfonamides status: Secondary | ICD-10-CM | POA: Diagnosis not present

## 2019-02-08 DIAGNOSIS — E11319 Type 2 diabetes mellitus with unspecified diabetic retinopathy without macular edema: Secondary | ICD-10-CM | POA: Diagnosis not present

## 2019-02-08 DIAGNOSIS — Z888 Allergy status to other drugs, medicaments and biological substances status: Secondary | ICD-10-CM | POA: Insufficient documentation

## 2019-02-08 DIAGNOSIS — Z8673 Personal history of transient ischemic attack (TIA), and cerebral infarction without residual deficits: Secondary | ICD-10-CM | POA: Insufficient documentation

## 2019-02-08 DIAGNOSIS — Z794 Long term (current) use of insulin: Secondary | ICD-10-CM | POA: Diagnosis not present

## 2019-02-08 DIAGNOSIS — E785 Hyperlipidemia, unspecified: Secondary | ICD-10-CM | POA: Diagnosis not present

## 2019-02-08 DIAGNOSIS — Z9861 Coronary angioplasty status: Secondary | ICD-10-CM | POA: Insufficient documentation

## 2019-02-08 DIAGNOSIS — K219 Gastro-esophageal reflux disease without esophagitis: Secondary | ICD-10-CM | POA: Diagnosis not present

## 2019-02-08 DIAGNOSIS — Z79899 Other long term (current) drug therapy: Secondary | ICD-10-CM | POA: Diagnosis not present

## 2019-02-08 DIAGNOSIS — Z8249 Family history of ischemic heart disease and other diseases of the circulatory system: Secondary | ICD-10-CM | POA: Insufficient documentation

## 2019-02-08 DIAGNOSIS — Z6831 Body mass index (BMI) 31.0-31.9, adult: Secondary | ICD-10-CM | POA: Insufficient documentation

## 2019-02-08 DIAGNOSIS — D125 Benign neoplasm of sigmoid colon: Secondary | ICD-10-CM | POA: Insufficient documentation

## 2019-02-08 DIAGNOSIS — K640 First degree hemorrhoids: Secondary | ICD-10-CM | POA: Diagnosis not present

## 2019-02-08 DIAGNOSIS — E559 Vitamin D deficiency, unspecified: Secondary | ICD-10-CM | POA: Insufficient documentation

## 2019-02-08 DIAGNOSIS — Z86010 Personal history of colonic polyps: Secondary | ICD-10-CM

## 2019-02-08 DIAGNOSIS — K589 Irritable bowel syndrome without diarrhea: Secondary | ICD-10-CM | POA: Diagnosis not present

## 2019-02-08 DIAGNOSIS — Z7982 Long term (current) use of aspirin: Secondary | ICD-10-CM | POA: Diagnosis not present

## 2019-02-08 DIAGNOSIS — I2510 Atherosclerotic heart disease of native coronary artery without angina pectoris: Secondary | ICD-10-CM | POA: Insufficient documentation

## 2019-02-08 DIAGNOSIS — F329 Major depressive disorder, single episode, unspecified: Secondary | ICD-10-CM | POA: Insufficient documentation

## 2019-02-08 DIAGNOSIS — Z87891 Personal history of nicotine dependence: Secondary | ICD-10-CM | POA: Diagnosis not present

## 2019-02-08 DIAGNOSIS — E669 Obesity, unspecified: Secondary | ICD-10-CM | POA: Diagnosis not present

## 2019-02-08 DIAGNOSIS — K635 Polyp of colon: Secondary | ICD-10-CM

## 2019-02-08 DIAGNOSIS — D120 Benign neoplasm of cecum: Secondary | ICD-10-CM | POA: Insufficient documentation

## 2019-02-08 DIAGNOSIS — I252 Old myocardial infarction: Secondary | ICD-10-CM | POA: Insufficient documentation

## 2019-02-08 HISTORY — PX: COLONOSCOPY WITH PROPOFOL: SHX5780

## 2019-02-08 LAB — GLUCOSE, CAPILLARY: Glucose-Capillary: 166 mg/dL — ABNORMAL HIGH (ref 70–99)

## 2019-02-08 SURGERY — COLONOSCOPY WITH PROPOFOL
Anesthesia: General

## 2019-02-08 MED ORDER — PROPOFOL 500 MG/50ML IV EMUL
INTRAVENOUS | Status: DC | PRN
  Administered 2019-02-08: 120 ug/kg/min via INTRAVENOUS

## 2019-02-08 MED ORDER — PROPOFOL 10 MG/ML IV BOLUS
INTRAVENOUS | Status: AC
  Filled 2019-02-08: qty 20

## 2019-02-08 MED ORDER — LIDOCAINE HCL (PF) 2 % IJ SOLN
INTRAMUSCULAR | Status: AC
  Filled 2019-02-08: qty 10

## 2019-02-08 MED ORDER — SODIUM CHLORIDE 0.9 % IV SOLN
INTRAVENOUS | Status: DC
  Administered 2019-02-08: 08:00:00 via INTRAVENOUS

## 2019-02-08 MED ORDER — LIDOCAINE HCL (CARDIAC) PF 100 MG/5ML IV SOSY
PREFILLED_SYRINGE | INTRAVENOUS | Status: DC | PRN
  Administered 2019-02-08: 60 mg via INTRAVENOUS

## 2019-02-08 MED ORDER — PROPOFOL 10 MG/ML IV BOLUS
INTRAVENOUS | Status: DC | PRN
  Administered 2019-02-08: 40 mg via INTRAVENOUS

## 2019-02-08 MED ORDER — PHENYLEPHRINE HCL (PRESSORS) 10 MG/ML IV SOLN
INTRAVENOUS | Status: AC
  Filled 2019-02-08: qty 1

## 2019-02-08 NOTE — Anesthesia Post-op Follow-up Note (Signed)
Anesthesia QCDR form completed.        

## 2019-02-08 NOTE — Op Note (Signed)
[facility]astroenter[facility] Patient Name: Andrea [name] Procedure Date: 02/08/2019 7:33 AM MRN: [id number] Account #: [id number] Date of Birth: [birth year] Admit Type: Outpatient Age: 73 Room: [facility] ENDO ROOM 4 Gender: Female Note Status: Finalized Procedure:             Colonoscopy Indications:           High risk colon cancer surveillance: Personal history                         of colonic polyps Providers:             [name] MD, MD Medicines:             Monitored Anesthesia Care Complications:         No immediate complications. Procedure:             Pre-Anesthesia Assessment:                        - Prior to the procedure, a History and Physical was                         performed, and patient medications, allergies and                         sensitivities were reviewed. The patient's tolerance                         of previous anesthesia was reviewed.                        - The risks and benefits of the procedure and the                         sedation options and risks were discussed with the                         patient. All questions were answered and informed                         consent was obtained.                        - After reviewing the risks and benefits, the patient                         was deemed in satisfactory condition to undergo the                         procedure.                        - ASA Grade Assessment: III - A patient with severe                         systemic disease.                        After obtaining informed consent, the colonoscope was  passed under direct vision. Throughout the procedure,                         the patient's blood pressure, pulse, and oxygen                         saturations were monitored continuously. The                         Colonoscope was introduced through the anus and                         advanced to the the cecum, identified by the                          appendiceal orifice. The colonoscopy was performed                         with ease. The patient tolerated the procedure well.                         The quality of the bowel preparation was excellent. Findings:      The perianal and digital rectal examinations were normal.      Three sessile polyps were found in the sigmoid colon, ascending colon       and cecum. The polyps were 5 to 8 mm in size. These polyps were removed       with a cold snare. Resection and retrieval were complete.      Non-bleeding internal hemorrhoids were found during endoscopy. The       hemorrhoids were medium-sized and Grade I (internal hemorrhoids that do       not prolapse).      The exam was otherwise without abnormality. Impression:            - Three 5 to 8 mm polyps in the sigmoid colon, in the                         ascending colon and in the cecum, removed with a cold                         snare. Resected and retrieved.                        - Non-bleeding internal hemorrhoids.                        - The examination was otherwise normal. Recommendation:        - Discharge patient to home (with escort).                        - Resume previous diet.                        - Continue present medications.                        - Await pathology results. Procedure Code(s):     --- Professional ---  45385, Colonoscopy, flexible; with removal of                         tumor(s), polyp(s), or other lesion(s) by snare                         technique Diagnosis Code(s):     --- Professional ---                        Z86.010, Personal history of colonic polyps                        K63.5, Polyp of colon                        K64.0, First degree hemorrhoids CPT copyright 2019 American Medical Association. All rights reserved. The codes documented in this report are preliminary and upon coder review may  be revised to meet current compliance  requirements. [name], MD [name] MD, MD 02/08/2019 8:46:28 AM This report has been signed electronically. Number of Addenda: 0 Note Initiated On: 02/08/2019 7:33 AM Scope Withdrawal Time: 0 hours 11 minutes 11 seconds  Total Procedure Duration: 0 hours 15 minutes 55 seconds  Estimated Blood Loss:  Estimated blood loss: none.      [facility]

## 2019-02-08 NOTE — Transfer of Care (Signed)
Immediate Anesthesia Transfer of Care Note  Patient: Andrea [name]  Procedure(s) Performed: COLONOSCOPY WITH PROPOFOL (N/A )  Patient Location: PACU  Anesthesia Type:General  Level of Consciousness: awake and patient cooperative  Airway & Oxygen Therapy: Patient Spontanous Breathing and Patient connected to nasal cannula oxygen  Post-op Assessment: Report given to RN and Post -op Vital signs reviewed and stable  Post vital signs: Reviewed and stable  Last Vitals:  Vitals Value Taken Time  BP 113/69 02/08/19 0850  Temp 36.2 C 02/08/19 0850  Pulse 103 02/08/19 0851  Resp 16 02/08/19 0851  SpO2 99 % 02/08/19 0851  Vitals shown include unvalidated device data.  Last Pain:  Vitals:   02/08/19 0803  TempSrc: Temporal  PainSc: 2          Complications: No apparent anesthesia complications

## 2019-02-08 NOTE — H&P (Signed)
Andrea Bellows, MD [address] [place], [place], [place], [address] [id number], [place], [place], [place], [address] Phone: [phone]  Fax: [phone]  Primary Care Physician:  Mar Daring, PA-C   Pre-Procedure History & Physical: HPI:  Andrea [name] is a 73 y.o. female is here for an colonoscopy.   Past Medical History:  Diagnosis Date  . Bronchitis   . Chickenpox   . Coronary artery disease   . Depression   . Diabetes mellitus type 2, uncomplicated ([place])   . Diabetes mellitus without complication ([place])   . Diabetic retinopathy ([place])   . Dupuytren contracture   . Frequent urinary tract infections   . GERD (gastroesophageal reflux disease)   . History of hand surgery 2011   left hand  . Hyperlipidemia, unspecified   . Irritable bowel syndrome   . MI (myocardial infarction) ([place]) 1994  . Neuromuscular disorder ([place])    nerve pain in back and legs  . Osteoporosis   . Pneumonia 2014  . Stroke ([place])   . TIA (transient ischemic attack)   . Vitamin D deficiency, unspecified   . Wheezing     Past Surgical History:  Procedure Laterality Date  . CARDIAC CATHETERIZATION  1994  . COLONOSCOPY     x3  . CORONARY ANGIOPLASTY  1994  . EYE SURGERY     cataracts  . HAND SURGERY Left 2011   left hand release of contractures  . HIP FRACTURE SURGERY  11/2013  . PR COLSC FLX W/REMOVAL LESION BY HOT BX FORCEPS   08/21/2015   Procedure: COLONOSCOPY, FLEXIBLE, PROXIMAL TO SPLENIC FLEXURE; W/REMOVAL TUMOR/POLYP/OTHER LESION, HOT BX FORCEP/CAUTE; Surgeon: Carlena Hurl, MD; Location: [facility]; Service: General Surgery    Prior to Admission medications   Medication Sig Start Date End Date Taking? Authorizing Provider  aspirin EC 81 MG tablet Take 81 mg daily by mouth.   Yes [provider]  atorvastatin (LIPITOR) 80 MG tablet TAKE 1 TABLET BY MOUTH EVERY DAY 10/16/18  Yes Gollan, Kathlene November, MD  [facility] CALCIUM 600+D 600-800 MG-UNIT TABS TAKE 1  TABLET BY MOUTH TWICE DAILY 11/23/18  Yes Burnette, Anderson Malta M, PA-C  Cyanocobalamin (B-12) 1000 MCG CAPS Take 1,000 mcg by mouth daily.    Yes [provider]  HYDROcodone-acetaminophen (NORCO) 10-325 MG tablet Take 1 tablet by mouth every 8 (eight) hours as needed. 01/16/19  Yes [name] M, PA-C  insulin glargine (LANTUS) 100 UNIT/ML injection Inject 0.65 mLs (65 Units total) into the skin daily with supper. 12/30/18  Yes [name] M, PA-C  Insulin Syringe-Needle U-100 29G 1 ML MISC Use to inject 65 units Lantus SQ BIDWC 10/02/15  Yes [name] M, PA-C  losartan (COZAAR) 25 MG tablet TAKE 1 TABLET BY MOUTH EVERY DAY 11/27/18  Yes [name] M, PA-C  metoprolol succinate (TOPROL-XL) 25 MG 24 hr tablet Take 1 tablet (25 mg total) by mouth daily. 11/27/18  Yes Gollan, Kathlene November, MD  Multiple Vitamins-Minerals (ICAPS AREDS 2 PO) Take 1 capsule by mouth 2 (two) times daily.    Yes [provider]  Omega-3 Fatty Acids (FISH OIL) 1200 MG CAPS Take 1 capsule (1,200 mg total) by mouth daily. 03/22/17  Yes Mar Daring, PA-C  omeprazole (PRILOSEC) 40 MG capsule Take 1 capsule (40 mg total) by mouth daily. 10/26/18  Yes Burnette, [place], PA-C  ONETOUCH DELICA LANCETS [id number] MISC TO CHECK BLOOD SUGAR DAILY 01/29/18  Yes Mar Daring, [place]  ONETOUCH VERIO test strip TO CHECK BLOOD SUGAR ONCE DAILY. 10/23/18  Yes Mar Daring, PA-C  oxybutynin (DITROPAN-XL) 10 MG 24 hr tablet TAKE 1 TABLET BY MOUTH EVERYDAY AT BEDTIME 12/28/18  Yes Mar Daring, PA-C  sertraline (ZOLOFT) 25 MG tablet Take 1 tablet (25 mg total) by mouth at bedtime. 06/22/17  Yes Burnette, Clearnce Sorrel, PA-C  Vitamin D, Ergocalciferol, (DRISDOL) 1.25 MG (50000 UT) CAPS capsule TAKE ONE CAPSULE BY MOUTH ONCE WEEKLY ON THE SAME DAY EACH WEEK (ON FRIDAYS) 09/14/18  Yes [name] M, PA-C  albuterol (VENTOLIN HFA) 108 (90 Base) MCG/ACT inhaler Inhale 2 puffs into the  lungs every 6 (six) hours as needed for wheezing or shortness of breath. 08/30/18   Mar Daring, PA-C  Alcohol Swabs (ALCOHOL PREP) PADS Use twice daily prior to SQ injection of insulin to clean skin 10/02/15   Mar Daring, PA-C  nitrofurantoin (MACRODANTIN) 100 MG capsule Take 100 mg at bedtime by mouth.    [provider]  TRULICITY 1.5 0000000 SOPN INJECT 1.5MG  INTO THE SKIN ONCE A WEEK AS DIRECTED 08/22/18   Mar Daring, PA-C    Allergies as of 01/10/2019 - Review Complete 12/30/2018  Allergen Reaction Noted  . Metformin and related Diarrhea 07/08/2015  . Sulfa antibiotics Rash 07/08/2015    Family History  Problem Relation Age of Onset  . Depression Daughter   . Arthritis Daughter        spine  . Plantar fasciitis Daughter   . Anxiety disorder Daughter   . Hyperlipidemia Daughter   . AAA (abdominal aortic aneurysm) Daughter   . Hypertension Mother   . Stroke Mother   . Cataracts Mother   . Depression Mother   . Heart disease Mother   . Asthma Brother   . Diabetes Daughter   . Hyperlipidemia Daughter   . Hypertension Daughter     Social History   Socioeconomic History  . Marital status: Married    Spouse name: Roselie Awkward  . Number of children: 3  . Years of education: 8  . Highest education level: 8th grade  Occupational History  . Occupation: retired  Scientific laboratory technician  . Financial resource strain: Not hard at all  . Food insecurity    Worry: Never true    Inability: Never true  . Transportation needs    Medical: No    Non-medical: No  Tobacco Use  . Smoking status: Former Smoker    Packs/day: 1.00    Years: 30.00    Pack years: 30.00    Types: Cigarettes    Quit date: 07/17/2012    Years since quitting: 6.5  . Smokeless tobacco: Never Used  . Tobacco comment:    Substance and Sexual Activity  . Alcohol use: Not Currently    Alcohol/week: 0.0 standard drinks  . Drug use: No  . Sexual activity: Not on file  Lifestyle  .  Physical activity    Days per week: Not on file    Minutes per session: Not on file  . Stress: Not at all  Relationships  . Social Herbalist on phone: Patient refused    Gets together: Patient refused    Attends religious service: Patient refused    Active member of club or organization: Patient refused    Attends meetings of clubs or organizations: Patient refused    Relationship status: Patient refused  . Intimate partner violence    Fear of current or ex partner:  Patient refused    Emotionally abused: Patient refused    Physically abused: Patient refused    Forced sexual activity: Patient refused  Other Topics Concern  . Not on file  Social History Narrative  . Not on file    Review of Systems: See HPI, otherwise negative ROS  Physical Exam: BP (!) 138/56   Pulse 88   Temp (!) 96.7 F (35.9 C) (Temporal)   Resp 20   Ht 4\' 11"  (1.499 m)   Wt 69.9 kg   SpO2 100%   BMI 31.10 kg/m  General:   Alert,  pleasant and cooperative in NAD Head:  Normocephalic and atraumatic. Neck:  Supple; no masses or thyromegaly. Lungs:  Clear throughout to auscultation, normal respiratory effort.    Heart:  +S1, +S2, Regular rate and rhythm, No edema. Abdomen:  Soft, nontender and nondistended. Normal bowel sounds, without guarding, and without rebound.   Neurologic:  Alert and  oriented x4;  grossly normal neurologically.  Impression/Plan: Andrea [name] is here for an colonoscopy to be performed for surveillance due to prior history of colon polyps   Risks, benefits, limitations, and alternatives regarding  colonoscopy have been reviewed with the patient.  Questions have been answered.  All parties agreeable.   Andrea Bellows, MD  02/08/2019, 8:13 AM

## 2019-02-08 NOTE — Anesthesia Preprocedure Evaluation (Addendum)
Anesthesia Evaluation  Patient identified by MRN, date of birth, ID band Patient awake    Reviewed: Allergy & Precautions, H&P , NPO status , Patient's Chart, lab work & pertinent test results  Airway Mallampati: II  TM Distance: >3 FB     Dental  (+) Edentulous Lower, Upper Dentures   Pulmonary shortness of breath and with exertion, COPD, former smoker,           Cardiovascular (-) angina+ CAD, + Past MI and + Peripheral Vascular Disease    Echo 2018:  Left ventricle: Systolic function was normal. The estimated   ejection fraction was in the range of 55% to 60%. Doppler   parameters are consistent with abnormal left ventricular   relaxation (grade 1 diastolic dysfunction).   Neuro/Psych PSYCHIATRIC DISORDERS Depression TIACVA    GI/Hepatic Neg liver ROS, GERD  Controlled,  Endo/Other  diabetes  Renal/GU negative Renal ROS  negative genitourinary   Musculoskeletal   Abdominal   Peds  Hematology   Anesthesia Other Findings Obese  Past Medical History: No date: Bronchitis No date: Chickenpox No date: Coronary artery disease No date: Depression No date: Diabetes mellitus type 2, uncomplicated ([facility]) No date: Diabetes mellitus without complication ([facility]) No date: Diabetic retinopathy ([facility]) No date: Dupuytren contracture No date: Frequent urinary tract infections No date: GERD (gastroesophageal reflux disease) 2011: History of hand surgery     Comment:  left hand No date: Hyperlipidemia, unspecified No date: Irritable bowel syndrome 1994: MI (myocardial infarction) ([place]) No date: Neuromuscular disorder ([place])     Comment:  nerve pain in back and legs No date: Osteoporosis 2014: Pneumonia No date: Stroke ([place]) No date: TIA (transient ischemic attack) No date: Vitamin D deficiency, unspecified No date: Wheezing  Past Surgical History: 1994: CARDIAC CATHETERIZATION No date: COLONOSCOPY     Comment:   x3 1994: CORONARY ANGIOPLASTY No date: EYE SURGERY     Comment:  cataracts 2011: HAND SURGERY; Left     Comment:  left hand release of contractures 11/2013: HIP FRACTURE SURGERY 08/21/2015: PR COLSC FLX W/REMOVAL LESION BY HOT BX FORCEPS      Comment:  Procedure: COLONOSCOPY, FLEXIBLE, PROXIMAL TO SPLENIC               FLEXURE; W/REMOVAL TUMOR/POLYP/OTHER LESION, HOT BX               FORCEP/CAUTE; Surgeon: Carlena Hurl, MD;               Location: [facility]; Service: General Surgery  BMI    Body Mass Index: 31.10 kg/m      Reproductive/Obstetrics negative OB ROS                            Anesthesia Physical Anesthesia Plan  ASA: III  Anesthesia Plan: General   Post-op Pain Management:    Induction:   PONV Risk Score and Plan: Propofol infusion and TIVA  Airway Management Planned: Natural Airway and Nasal Cannula  Additional Equipment:   Intra-op Plan:   Post-operative Plan:   Informed Consent: I have reviewed the patients History and Physical, chart, labs and discussed the procedure including the risks, benefits and alternatives for the proposed anesthesia with the patient or authorized representative who has indicated his/her understanding and acceptance.     Dental Advisory Given  Plan Discussed with: Anesthesiologist  Anesthesia Plan Comments:         Anesthesia Quick Evaluation

## 2019-02-09 ENCOUNTER — Encounter: Payer: Self-pay | Admitting: Gastroenterology

## 2019-02-09 LAB — SURGICAL PATHOLOGY

## 2019-02-09 NOTE — Anesthesia Postprocedure Evaluation (Signed)
Anesthesia Post Note  Patient: Andrea [name]  Procedure(s) Performed: COLONOSCOPY WITH PROPOFOL (N/A )  Patient location during evaluation: PACU Anesthesia Type: General Level of consciousness: awake and alert Pain management: pain level controlled Vital Signs Assessment: post-procedure vital signs reviewed and stable Respiratory status: spontaneous breathing, nonlabored ventilation and respiratory function stable Cardiovascular status: blood pressure returned to baseline and stable Postop Assessment: no apparent nausea or vomiting Anesthetic complications: no     Last Vitals:  Vitals:   02/08/19 0900 02/08/19 0910  BP: (!) 120/46 125/77  Pulse: 81 91  Resp: 18 19  Temp:    SpO2: 100% 100%    Last Pain:  Vitals:   02/09/19 0737  TempSrc:   PainSc: 0-No pain                 Durenda Hurt

## 2019-02-11 ENCOUNTER — Encounter: Payer: Self-pay | Admitting: Gastroenterology

## 2019-02-15 ENCOUNTER — Other Ambulatory Visit: Payer: Self-pay | Admitting: Physician Assistant

## 2019-02-15 DIAGNOSIS — M4316 Spondylolisthesis, lumbar region: Secondary | ICD-10-CM

## 2019-02-15 MED ORDER — HYDROCODONE-ACETAMINOPHEN 10-325 MG PO TABS: 1.0000 | ORAL_TABLET | Freq: Three times a day (TID) | ORAL | 0 refills | Status: DC | PRN

## 2019-02-15 NOTE — Telephone Encounter (Signed)
Requested medication (s) are due for refill today: yes  Requested medication (s) are on the active medication list: yes  Last refill:  01/16/2019  Future visit scheduled: no  Notes to clinic: refill cannot be delegated    Requested Prescriptions  Pending Prescriptions Disp Refills   HYDROcodone-acetaminophen (NORCO) 10-325 MG tablet 90 tablet 0    Sig: Take 1 tablet by mouth every 8 (eight) hours as needed.      Not Delegated - Analgesics:  Opioid Agonist Combinations Failed - 02/15/2019  9:38 AM      Failed - This refill cannot be delegated      Failed - Urine Drug Screen completed in last 360 days.      Passed - Valid encounter within last 6 months    Recent Outpatient Visits           1 month ago Annual physical exam   [facility] [name] M, [place]   5 months ago 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, with long-term current use of insulin, macular edema presence unspecified, unspecified laterality [facility])   [place], Anderson Malta M, PA-C   10 months ago 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, with long-term current use of insulin, macular edema presence unspecified, unspecified laterality [facility])   [place], Clearnce Sorrel, [place]   1 year ago Annual physical exam   [facility] [name] M, PA-C   1 year ago 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, with long-term current use of insulin, macular edema presence unspecified, unspecified laterality [facility])   [place], [place], [place]

## 2019-02-15 NOTE — Telephone Encounter (Signed)
Medication Refill - Medication: HYDROcodone-acetaminophen (NORCO) 10-325 MG tablet   [id number] [place], [place] Phone:  [phone]  Fax:  [phone]       Pt was advised that RX refills may take up to 3 business days. We ask that you follow-up with your pharmacy.

## 2019-02-23 ENCOUNTER — Other Ambulatory Visit: Payer: Self-pay | Admitting: Physician Assistant

## 2019-02-23 DIAGNOSIS — E559 Vitamin D deficiency, unspecified: Secondary | ICD-10-CM

## 2019-02-23 NOTE — Telephone Encounter (Signed)
Requested medication (s) are due for refill today: yes  Requested medication (s) are on the active medication list: yes  Last refill: 12/05/2018  Future visit scheduled: no  Notes to clinic:  refill cannot be delegated    Requested Prescriptions  Pending Prescriptions Disp Refills   Vitamin D, Ergocalciferol, (DRISDOL) 1.25 MG (50000 UT) CAPS capsule [Pharmacy Med Name: VITAMIN D2 1.25MG (50,000 UNIT)] 12 capsule 1    Sig: TAKE ONE CAPSULE BY MOUTH ONCE WEEKLY ON THE SAME DAY [place] (ON FRIDAYS)      Endocrinology:  Vitamins - Vitamin D Supplementation Failed - 02/23/2019  2:55 AM      Failed - 50,000 IU strengths are not delegated      Failed - Ca in normal range and within 360 days    Calcium  Date Value Ref Range Status  12/28/2018 10.5 (H) 8.7 - 10.3 mg/dL Final   Calcium, Total  Date Value Ref Range Status  01/01/2014 8.3 (L) 8.5 - 10.1 mg/dL Final          Failed - Phosphate in normal range and within 360 days    No results found for: PHOS        Failed - Vitamin D in normal range and within 360 days    No results found for: [id number], [id number], [id number], 25OHVITD3, [place], [place], [place], [place], 25OHVITD2, 25OHVITD3, VD25OH        Passed - Valid encounter within last 12 months    Recent Outpatient Visits           1 month ago Annual physical exam   [facility] [name] M, PA-C   5 months ago 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, with long-term current use of insulin, macular edema presence unspecified, unspecified laterality [facility])   [place], Anderson Malta M, PA-C   10 months ago 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, with long-term current use of insulin, macular edema presence unspecified, unspecified laterality [facility])   [place], Clearnce Sorrel, [place]   1 year ago Annual physical exam   [place], [place], PA-C   1 year ago 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, with long-term current use of insulin, macular edema presence unspecified, unspecified laterality [facility])   [facility] [place], [place], [place]

## 2019-03-09 DIAGNOSIS — I252 Old myocardial infarction: Secondary | ICD-10-CM | POA: Insufficient documentation

## 2019-03-19 ENCOUNTER — Other Ambulatory Visit: Payer: Self-pay | Admitting: Physician Assistant

## 2019-03-19 ENCOUNTER — Telehealth: Payer: Self-pay | Admitting: Physician Assistant

## 2019-03-19 DIAGNOSIS — M4316 Spondylolisthesis, lumbar region: Secondary | ICD-10-CM

## 2019-03-19 MED ORDER — HYDROCODONE-ACETAMINOPHEN 10-325 MG PO TABS: 1.0000 | ORAL_TABLET | Freq: Three times a day (TID) | ORAL | 0 refills | Status: DC | PRN

## 2019-03-19 NOTE — Telephone Encounter (Signed)
Patient is calling to ask is it time to schedule an appt to follow back up with Andrea [name] Please advise CB- [phone]

## 2019-03-19 NOTE — Telephone Encounter (Signed)
Patient has been scheduled

## 2019-03-19 NOTE — Telephone Encounter (Signed)
Medication: HYDROcodone-acetaminophen (NORCO) 10-325 MG tablet [id number]   Has the patient contacted their pharmacy? Yes  (Agent: If no, request that the patient contact the pharmacy for the refill.) (Agent: If yes, when and what did the pharmacy advise?)  Preferred Pharmacy (with phone number or street name): [id number] [place], [place] [address] Phone: [phone] Fax: [phone] Not a 24 hour pharmacy; exact hours not known.    Agent: Please be advised that RX refills may take up to 3 business days. We ask that you follow-up with your pharmacy.

## 2019-03-19 NOTE — Telephone Encounter (Signed)
Requested medication (s) are due for refill today: yes  Requested medication (s) are on the active medication list: yes  Last refill:  02/15/2019  Future visit scheduled: no  Notes to clinic:  not delegated    Requested Prescriptions  Pending Prescriptions Disp Refills   HYDROcodone-acetaminophen (NORCO) 10-325 MG tablet 90 tablet 0    Sig: Take 1 tablet by mouth every 8 (eight) hours as needed.      Not Delegated - Analgesics:  Opioid Agonist Combinations Failed - 03/19/2019  9:23 AM      Failed - This refill cannot be delegated      Failed - Urine Drug Screen completed in last 360 days.      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, with long-term current use of insulin, macular edema presence unspecified, unspecified laterality [facility])   [place], Anderson Malta M, [place]   11 months ago 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, with long-term current use of insulin, macular edema presence unspecified, unspecified laterality [facility])   [place], Clearnce Sorrel, [place]   1 year ago Annual physical exam   [facility] [name] M, PA-C   1 year ago 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, with long-term current use of insulin, macular edema presence unspecified, unspecified laterality [facility])   [place], [place], [place]

## 2019-03-22 ENCOUNTER — Encounter (INDEPENDENT_AMBULATORY_CARE_PROVIDER_SITE_OTHER): Payer: Self-pay

## 2019-03-22 ENCOUNTER — Ambulatory Visit (INDEPENDENT_AMBULATORY_CARE_PROVIDER_SITE_OTHER): Payer: Medicare Other | Admitting: Nurse Practitioner

## 2019-03-22 ENCOUNTER — Encounter (INDEPENDENT_AMBULATORY_CARE_PROVIDER_SITE_OTHER): Payer: Self-pay | Admitting: Nurse Practitioner

## 2019-03-22 ENCOUNTER — Other Ambulatory Visit: Payer: Self-pay

## 2019-03-22 ENCOUNTER — Ambulatory Visit (INDEPENDENT_AMBULATORY_CARE_PROVIDER_SITE_OTHER): Payer: Medicare Other

## 2019-03-22 VITALS — BP 148/83 | HR 82 | Resp 14 | Ht 59.0 in | Wt 161.0 lb

## 2019-03-22 DIAGNOSIS — I739 Peripheral vascular disease, unspecified: Secondary | ICD-10-CM

## 2019-03-22 DIAGNOSIS — E782 Mixed hyperlipidemia: Secondary | ICD-10-CM | POA: Diagnosis not present

## 2019-03-22 DIAGNOSIS — E113293 Type 2 diabetes mellitus with mild nonproliferative diabetic retinopathy without macular edema, bilateral: Secondary | ICD-10-CM | POA: Diagnosis not present

## 2019-03-22 DIAGNOSIS — Z794 Long term (current) use of insulin: Secondary | ICD-10-CM

## 2019-03-22 NOTE — Progress Notes (Signed)
SUBJECTIVE:  Patient ID: Andrea [name], female    DOB: 04/22/1945, 74 y.o.   MRN: [id number] Chief Complaint  Patient presents with  . Follow-up    U/S Follow up    HPI  Andrea [name] is a 74 y.o. female the presents today for follow-up studies regarding noninvasive studies that were done as part of a screening by her insurance company.  She was told that she had decreased blood flow in her right lower extremity.  A digital ABI was done using Quanta flow that revealed a 0.75 ABI on the right and 1.29 ABI on the left.  The patient denies any claudication-like symptoms however after a recent hip surgery she does not ambulate very far or for long.  However she also denies any rest pain like symptoms or lower extremity wounds.  She denies any fever, chills, nausea, vomiting or diarrhea.  She denies any chest pain or shortness of breath.  She denies any TIA-like symptoms.  Today she underwent bilateral ABIs which revealed an ABI of 1.25 on the right and 1.04 on the left.  Her TBI was 0.77 on the right and 0.91 on the left.  Bilateral lower extremity arterial duplex revealed biphasic waveforms throughout the level of the left lower extremity.  The right lower extremity has biphasic/triphasic waveforms throughout the lower extremity.  Past Medical History:  Diagnosis Date  . Bronchitis   . Chickenpox   . Coronary artery disease   . Depression   . Diabetes mellitus type 2, uncomplicated ([place])   . Diabetes mellitus without complication ([place])   . Diabetic retinopathy ([place])   . Dupuytren contracture   . Frequent urinary tract infections   . GERD (gastroesophageal reflux disease)   . History of hand surgery 2011   left hand  . Hyperlipidemia, unspecified   . Irritable bowel syndrome   . MI (myocardial infarction) ([place]) 1994  . Neuromuscular disorder ([place])    nerve pain in back and legs  . Osteoporosis   . Pneumonia 2014  . Stroke ([place])   . TIA (transient ischemic attack)   . Vitamin  D deficiency, unspecified   . Wheezing     Past Surgical History:  Procedure Laterality Date  . CARDIAC CATHETERIZATION  1994  . COLONOSCOPY     x3  . COLONOSCOPY WITH PROPOFOL N/A 02/08/2019   Procedure: COLONOSCOPY WITH PROPOFOL;  Surgeon: [name], MD;  Location: [facility];  Service: Gastroenterology;  Laterality: N/A;  . CORONARY ANGIOPLASTY  1994  . EYE SURGERY     cataracts  . HAND SURGERY Left 2011   left hand release of contractures  . HIP FRACTURE SURGERY  11/2013  . PR COLSC FLX W/REMOVAL LESION BY HOT BX FORCEPS   08/21/2015   Procedure: COLONOSCOPY, FLEXIBLE, PROXIMAL TO SPLENIC FLEXURE; W/REMOVAL TUMOR/POLYP/OTHER LESION, HOT BX FORCEP/CAUTE; Surgeon: Carlena Hurl, MD; Location: [facility]; Service: General Surgery    Social History   Socioeconomic History  . Marital status: Married    Spouse name: Roselie Awkward  . Number of children: 3  . Years of education: 8  . Highest education level: 8th grade  Occupational History  . Occupation: retired  Tobacco Use  . Smoking status: Former Smoker    Packs/day: 1.00    Years: 30.00    Pack years: 30.00    Types: Cigarettes    Quit date: 07/17/2012    Years since quitting: 6.6  . Smokeless tobacco: Never Used  . Tobacco comment:  Substance and Sexual Activity  . Alcohol use: Not Currently    Alcohol/week: 0.0 standard drinks  . Drug use: No  . Sexual activity: Not on file  Other Topics Concern  . Not on file  Social History Narrative  . Not on file   Social Determinants of Health   Financial Resource Strain:   . Difficulty of Paying Living Expenses: Not on file  Food Insecurity:   . Worried About Charity fundraiser in the Last Year: Not on file  . Ran Out of Food in the Last Year: Not on file  Transportation Needs:   . Lack of Transportation (Medical): Not on file  . Lack of Transportation (Non-Medical): Not on file  Physical Activity:   . Days of Exercise per Week: Not on file  .  Minutes of Exercise per Session: Not on file  Stress:   . Feeling of Stress : Not on file  Social Connections:   . Frequency of Communication with Friends and Family: Not on file  . Frequency of Social Gatherings with Friends and Family: Not on file  . Attends Religious Services: Not on file  . Active Member of Clubs or Organizations: Not on file  . Attends Archivist Meetings: Not on file  . Marital Status: Not on file  Intimate Partner Violence:   . Fear of Current or Ex-Partner: Not on file  . Emotionally Abused: Not on file  . Physically Abused: Not on file  . Sexually Abused: Not on file    Family History  Problem Relation Age of Onset  . Depression Daughter   . Arthritis Daughter        spine  . Plantar fasciitis Daughter   . Anxiety disorder Daughter   . Hyperlipidemia Daughter   . AAA (abdominal aortic aneurysm) Daughter   . Hypertension Mother   . Stroke Mother   . Cataracts Mother   . Depression Mother   . Heart disease Mother   . Asthma Brother   . Diabetes Daughter   . Hyperlipidemia Daughter   . Hypertension Daughter     Allergies  Allergen Reactions  . Metformin And Related Diarrhea  . Sulfa Antibiotics Rash     Review of Systems   Review of Systems: Negative Unless Checked Constitutional: [] Weight loss  [] Fever  [] Chills Cardiac: [] Chest pain   []  Atrial Fibrillation  [] Palpitations   [] Shortness of breath when laying flat   [] Shortness of breath with exertion. [] Shortness of breath at rest Vascular:  [] Pain in legs with walking   [] Pain in legs with standing [] Pain in legs when laying flat   [] Claudication    [] Pain in feet when laying flat    [] History of DVT   [] Phlebitis   [] Swelling in legs   [] Varicose veins   [] Non-healing ulcers Pulmonary:   [] Uses home oxygen   [] Productive cough   [] Hemoptysis   [] Wheeze  [x] COPD   [] Asthma Neurologic:  [] Dizziness   [] Seizures  [] Blackouts [] History of stroke   [] History of TIA  [] Aphasia    [] Temporary Blindness   [] Weakness or numbness in arm   [] Weakness or numbness in leg Musculoskeletal:   [] Joint swelling   [] Joint pain   [] Low back pain  []  History of Knee Replacement [x] Arthritis [] back Surgeries  [x]  Spinal Stenosis    Hematologic:  [] Easy bruising  [] Easy bleeding   [] Hypercoagulable state   [] Anemic Gastrointestinal:  [] Diarrhea   [] Vomiting  [] Gastroesophageal reflux/heartburn   [] Difficulty swallowing. [] Abdominal  pain Genitourinary:  [] Chronic kidney disease   [] Difficult urination  [] Anuric   [] Blood in urine [] Frequent urination  [] Burning with urination   [] Hematuria Skin:  [] Rashes   [] Ulcers [] Wounds Psychological:  [] History of anxiety   []  History of major depression  []  Memory Difficulties      OBJECTIVE:   Physical Exam  BP (!) 148/83 (BP Location: Right Arm)   Pulse 82   Resp 14   Ht 4\' 11"  (1.499 m)   Wt 161 lb (73 kg)   BMI 32.52 kg/m   Gen: WD/WN, NAD Head: [place]/AT, No temporalis wasting.  Ear/Nose/Throat: Hearing grossly intact, nares w/o erythema or drainage Eyes: PER, EOMI, sclera nonicteric.  Neck: Supple, no masses.  No JVD.  Pulmonary:  Good air movement, no use of accessory muscles.  Cardiac: RRR Vascular:  Vessel Right Left  Radial Palpable Palpable  Dorsalis Pedis Palpable Palpable  Posterior Tibial Palpable Palpable   Gastrointestinal: soft, non-distended. No guarding/no peritoneal signs.  Musculoskeletal: Uses walker today, wheelchair-bound at home No deformity or atrophy.  Neurologic: Pain and light touch intact in extremities.  Symmetrical.  Speech is fluent. Motor exam as listed above. Psychiatric: Judgment intact, Mood & affect appropriate for pt's clinical situation. Dermatologic: No Venous rashes. No Ulcers Noted.  No changes consistent with cellulitis.       ASSESSMENT AND PLAN:  1. PAD (peripheral artery disease) ([place]) Patient currently does not have any limb threatening or emergent symptoms of peripheral arterial  disease.  However based on her studies she does have seem to have some very mild disease.  Based on her multiple comorbidities in addition to her previous smoking history I feel it be prudent to at least monitor her on an annual basis in case some changes should occur.  Patient is agreement with this plan we will see her 1 year for noninvasive studies. - VAS [place] ABI WITH/WO TBI; Future  2. 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 edema, with long-term current use of insulin (HCC) Continue hypoglycemic medications as already ordered, these medications have been reviewed and there are no changes at this time.  Hgb A1C to be monitored as already arranged by primary service   3. Mixed hyperlipidemia Continue statin as ordered and reviewed, no changes at this time    Current Outpatient Medications on File Prior to Visit  Medication Sig Dispense Refill  . albuterol (VENTOLIN HFA) 108 (90 Base) MCG/ACT inhaler Inhale 2 puffs into the lungs every 6 (six) hours as needed for wheezing or shortness of breath. 18 g 1  . Alcohol Swabs (ALCOHOL PREP) PADS Use twice daily prior to SQ injection of insulin to clean skin 100 each 6  . aspirin EC 81 MG tablet Take 81 mg daily by mouth.    [name] Kitchen atorvastatin (LIPITOR) 80 MG tablet TAKE 1 TABLET BY MOUTH EVERY DAY 30 tablet 5  . [facility] CALCIUM 600+D 600-800 MG-UNIT TABS TAKE 1 TABLET BY MOUTH TWICE DAILY 120 tablet 2  . Cyanocobalamin (B-12) 1000 MCG CAPS Take 1,000 mcg by mouth daily.     [name] Kitchen HYDROcodone-acetaminophen (NORCO) 10-325 MG tablet Take 1 tablet by mouth every 8 (eight) hours as needed. 90 tablet 0  . insulin glargine (LANTUS) 100 UNIT/ML injection Inject 0.65 mLs (65 Units total) into the skin daily with supper. 10 mL 99  . Insulin Syringe-Needle U-100 29G 1 ML MISC Use to inject 65 units Lantus SQ BIDWC 100 each 6  .  losartan (COZAAR) 25 MG tablet TAKE 1 TABLET BY MOUTH EVERY DAY 90 tablet 1  .  metoprolol succinate (TOPROL-XL) 25 MG 24 hr tablet Take 1 tablet (25 mg total) by mouth daily. 90 tablet 3  . Multiple Vitamins-Minerals (ICAPS AREDS 2 PO) Take 1 capsule by mouth 2 (two) times daily.     . Omega-3 Fatty Acids (FISH OIL) 1200 MG CAPS Take 1 capsule (1,200 mg total) by mouth daily. 90 capsule 3  . omeprazole (PRILOSEC) 40 MG capsule Take 1 capsule (40 mg total) by mouth daily. 90 capsule 3  . ONETOUCH DELICA LANCETS 99991111 MISC TO CHECK BLOOD SUGAR DAILY 100 each 5  . ONETOUCH VERIO test strip TO CHECK BLOOD SUGAR ONCE DAILY. 100 strip 3  . oxybutynin (DITROPAN-XL) 10 MG 24 hr tablet TAKE 1 TABLET BY MOUTH EVERYDAY AT BEDTIME 90 tablet 1  . sertraline (ZOLOFT) 25 MG tablet Take 1 tablet (25 mg total) by mouth at bedtime. 90 tablet 1  . TRULICITY 1.5 0000000 SOPN INJECT 1.5MG  INTO THE SKIN ONCE A WEEK AS DIRECTED 15 mL 11  . Vitamin D, Ergocalciferol, (DRISDOL) 1.25 MG (50000 UT) CAPS capsule TAKE ONE CAPSULE BY MOUTH ONCE WEEKLY ON THE SAME DAY EACH WEEK (ON FRIDAYS) 12 capsule 1  . nitrofurantoin (MACRODANTIN) 100 MG capsule Take 100 mg at bedtime by mouth.     No current facility-administered medications on file prior to visit.    There are no Patient Instructions on file for this visit. No follow-ups on file.   Kris Hartmann, NP  This note was completed with Sales executive.  Any errors are purely unintentional.

## 2019-04-05 ENCOUNTER — Ambulatory Visit (INDEPENDENT_AMBULATORY_CARE_PROVIDER_SITE_OTHER): Payer: Medicare Other | Admitting: Physician Assistant

## 2019-04-05 ENCOUNTER — Encounter: Payer: Self-pay | Admitting: Physician Assistant

## 2019-04-05 ENCOUNTER — Other Ambulatory Visit: Payer: Self-pay

## 2019-04-05 VITALS — BP 129/75 | HR 96 | Temp 96.9°F | Resp 16 | Wt 160.4 lb

## 2019-04-05 DIAGNOSIS — E1151 Type 2 diabetes mellitus with diabetic peripheral angiopathy without gangrene: Secondary | ICD-10-CM

## 2019-04-05 DIAGNOSIS — Z794 Long term (current) use of insulin: Secondary | ICD-10-CM | POA: Diagnosis not present

## 2019-04-05 LAB — POCT GLYCOSYLATED HEMOGLOBIN (HGB A1C)
Est. average glucose Bld gHb Est-mCnc: 171
Hemoglobin A1C: 7.6 % — AB (ref 4.0–5.6)

## 2019-04-05 NOTE — Progress Notes (Signed)
Patient: Andrea [name] Female    DOB: February 07, 1946   74 y.o.   MRN: [id number] Visit Date: 04/05/2019  Today's Provider: Mar Daring, PA-C   Chief Complaint  Patient presents with  . Follow-up    T2DM   Subjective:     HPI  Type 2 diabetes mellitus with diabetic peripheral angiopathy without gangrene, with long-term current use of insulin: Stable. Continue lantus 65 units daily, trulicity 1.5mg /0.62mL. Continue lifestyle management. Lab Results  Component Value Date   HGBA1C 7.6 (A) 04/05/2019   Mixed hyperlipidemia: Stable. Patient on Atorvastatin 80mg .   Allergies  Allergen Reactions  . Metformin And Related Diarrhea  . Sulfa Antibiotics Rash     Current Outpatient Medications:  .  albuterol (VENTOLIN HFA) 108 (90 Base) MCG/ACT inhaler, Inhale 2 puffs into the lungs every 6 (six) hours as needed for wheezing or shortness of breath., Disp: 18 g, Rfl: 1 .  Alcohol Swabs (ALCOHOL PREP) PADS, Use twice daily prior to SQ injection of insulin to clean skin, Disp: 100 each, Rfl: 6 .  aspirin EC 81 MG tablet, Take 81 mg daily by mouth., Disp: , Rfl:  .  atorvastatin (LIPITOR) 80 MG tablet, TAKE 1 TABLET BY MOUTH EVERY DAY, Disp: 30 tablet, Rfl: 5 .  CVS CALCIUM 600+D 600-800 MG-UNIT TABS, TAKE 1 TABLET BY MOUTH TWICE DAILY, Disp: 120 tablet, Rfl: 2 .  Cyanocobalamin (B-12) 1000 MCG CAPS, Take 1,000 mcg by mouth daily. , Disp: , Rfl:  .  HYDROcodone-acetaminophen (NORCO) 10-325 MG tablet, Take 1 tablet by mouth every 8 (eight) hours as needed., Disp: 90 tablet, Rfl: 0 .  insulin glargine (LANTUS) 100 UNIT/ML injection, Inject 0.65 mLs (65 Units total) into the skin daily with supper., Disp: 10 mL, Rfl: 99 .  Insulin Syringe-Needle U-100 29G 1 ML MISC, Use to inject 65 units Lantus SQ BIDWC, Disp: 100 each, Rfl: 6 .  losartan (COZAAR) 25 MG tablet, TAKE 1 TABLET BY MOUTH EVERY DAY, Disp: 90 tablet, Rfl: 1 .  metoprolol succinate (TOPROL-XL) 25 MG 24 hr tablet, Take 1  tablet (25 mg total) by mouth daily., Disp: 90 tablet, Rfl: 3 .  Multiple Vitamins-Minerals (ICAPS AREDS 2 PO), Take 1 capsule by mouth 2 (two) times daily. , Disp: , Rfl:  .  nitrofurantoin (MACRODANTIN) 100 MG capsule, Take 100 mg at bedtime by mouth., Disp: , Rfl:  .  Omega-3 Fatty Acids (FISH OIL) 1200 MG CAPS, Take 1 capsule (1,200 mg total) by mouth daily., Disp: 90 capsule, Rfl: 3 .  omeprazole (PRILOSEC) 40 MG capsule, Take 1 capsule (40 mg total) by mouth daily., Disp: 90 capsule, Rfl: 3 .  ONETOUCH DELICA LANCETS 99991111 MISC, TO CHECK BLOOD SUGAR DAILY, Disp: 100 each, Rfl: 5 .  ONETOUCH VERIO test strip, TO CHECK BLOOD SUGAR ONCE DAILY., Disp: 100 strip, Rfl: 3 .  oxybutynin (DITROPAN-XL) 10 MG 24 hr tablet, TAKE 1 TABLET BY MOUTH EVERYDAY AT BEDTIME, Disp: 90 tablet, Rfl: 1 .  sertraline (ZOLOFT) 25 MG tablet, Take 1 tablet (25 mg total) by mouth at bedtime., Disp: 90 tablet, Rfl: 1 .  TRULICITY 1.5 0000000 SOPN, INJECT 1.5MG  INTO THE SKIN ONCE A WEEK AS DIRECTED, Disp: 15 mL, Rfl: 11 .  Vitamin D, Ergocalciferol, (DRISDOL) 1.25 MG (50000 UT) CAPS capsule, TAKE ONE CAPSULE BY MOUTH ONCE WEEKLY ON THE SAME DAY EACH WEEK (ON FRIDAYS), Disp: 12 capsule, Rfl: 1  Review of Systems  Constitutional: Negative.  Respiratory: Negative.   Cardiovascular: Negative.   Neurological: Negative.     Social History   Tobacco Use  . Smoking status: Former Smoker    Packs/day: 1.00    Years: 30.00    Pack years: 30.00    Types: Cigarettes    Quit date: 07/17/2012    Years since quitting: 6.7  . Smokeless tobacco: Never Used  . Tobacco comment:    Substance Use Topics  . Alcohol use: Not Currently    Alcohol/week: 0.0 standard drinks      Objective:   BP 129/75 (BP Location: Left Arm, Patient Position: Sitting, Cuff Size: Large)   Pulse 96   Temp (!) 96.9 F (36.1 C) (Temporal)   Resp 16   Wt 160 lb 6.4 oz (72.8 kg)   BMI 32.40 kg/m  Vitals:   04/05/19 1109  BP: 129/75  Pulse:  96  Resp: 16  Temp: (!) 96.9 F (36.1 C)  TempSrc: Temporal  Weight: 160 lb 6.4 oz (72.8 kg)  Body mass index is 32.4 kg/m.   Physical Exam Vitals reviewed.  Constitutional:      General: She is not in acute distress.    Appearance: Normal appearance. She is well-developed. She is obese. She is not ill-appearing or diaphoretic.  Cardiovascular:     Rate and Rhythm: Normal rate and regular rhythm.     Pulses: Normal pulses.     Heart sounds: Normal heart sounds. No murmur. No friction rub. No gallop.   Pulmonary:     Effort: Pulmonary effort is normal. No respiratory distress.     Breath sounds: Normal breath sounds. No wheezing or rales.  Musculoskeletal:     Cervical back: Normal range of motion and neck supple.  Neurological:     Mental Status: She is alert.      Results for orders placed or performed in visit on 04/05/19  POCT glycosylated hemoglobin (Hb A1C)  Result Value Ref Range   Hemoglobin A1C 7.6 (A) 4.0 - 5.6 %   Est. average glucose Bld gHb Est-mCnc 171        Assessment & Plan    1. Type 2 diabetes mellitus with diabetic peripheral angiopathy without gangrene, with long-term current use of insulin (HCC) A1c stable at 7.6. Continue Trulicity 1.5mg  weekly and lantus 65 units with supper. Return in 3 months.     Mar Daring, PA-C  [facility]

## 2019-04-05 NOTE — Patient Instructions (Signed)

## 2019-04-12 ENCOUNTER — Other Ambulatory Visit: Payer: Self-pay | Admitting: Cardiovascular Disease

## 2019-04-19 ENCOUNTER — Other Ambulatory Visit: Payer: Self-pay | Admitting: Physician Assistant

## 2019-04-19 DIAGNOSIS — M4316 Spondylolisthesis, lumbar region: Secondary | ICD-10-CM

## 2019-04-19 MED ORDER — HYDROCODONE-ACETAMINOPHEN 10-325 MG PO TABS: 1.0000 | ORAL_TABLET | Freq: Three times a day (TID) | ORAL | 0 refills | Status: DC | PRN

## 2019-04-19 NOTE — Telephone Encounter (Signed)
Medication Refill - Medication: HYDROcodone-acetaminophen (NORCO) 10-325 MG tablet   Has the patient contacted their pharmacy? Yes.   (Agent: If no, request that the patient contact the pharmacy for the refill.) (Agent: If yes, when and what did the pharmacy advise?)  Preferred Pharmacy (with phone number or street name):  [id number] [place], [place]  [address] [place] [address]  Phone: [phone] Fax: [phone]     Agent: Please be advised that RX refills may take up to 3 business days. We ask that you follow-up with your pharmacy.

## 2019-04-19 NOTE — Telephone Encounter (Signed)
Requested medication (s) are due for refill today: yes  Requested medication (s) are on the active medication list: yes  Last refill:  03/19/19  Future visit scheduled: yes  Notes to clinic:  not delegated    Requested Prescriptions  Pending Prescriptions Disp Refills   HYDROcodone-acetaminophen (NORCO) 10-325 MG tablet 90 tablet 0    Sig: Take 1 tablet by mouth every 8 (eight) hours as needed.      Not Delegated - Analgesics:  Opioid Agonist Combinations Failed - 04/19/2019 11:01 AM      Failed - This refill cannot be delegated      Failed - Urine Drug Screen completed in last 360 days.      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, with long-term current use of insulin, macular edema presence unspecified, unspecified laterality [facility])   [place], [place], PA-C   1 year ago 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, with long-term current use of insulin, macular edema presence unspecified, unspecified laterality [facility])   [place], Clearnce Sorrel, [place]   1 year ago Annual physical exam   [place], Clearnce [place], [place]       Future Appointments             In 1 week [place] Rile, Martie [place], NP [place], [facility]   In 2 months [place], [place], PA-C [employer], [place]

## 2019-04-26 ENCOUNTER — Encounter: Payer: Self-pay | Admitting: Family

## 2019-04-26 ENCOUNTER — Telehealth (INDEPENDENT_AMBULATORY_CARE_PROVIDER_SITE_OTHER): Payer: Medicare Other | Admitting: Family

## 2019-04-26 ENCOUNTER — Other Ambulatory Visit: Payer: Self-pay

## 2019-04-26 VITALS — BP 129/75 | HR 96 | Ht 60.0 in | Wt 154.0 lb

## 2019-04-26 DIAGNOSIS — I2510 Atherosclerotic heart disease of native coronary artery without angina pectoris: Secondary | ICD-10-CM | POA: Diagnosis not present

## 2019-04-26 DIAGNOSIS — I739 Peripheral vascular disease, unspecified: Secondary | ICD-10-CM | POA: Diagnosis not present

## 2019-04-26 DIAGNOSIS — I10 Essential (primary) hypertension: Secondary | ICD-10-CM | POA: Diagnosis not present

## 2019-04-26 DIAGNOSIS — E782 Mixed hyperlipidemia: Secondary | ICD-10-CM

## 2019-04-26 DIAGNOSIS — I4510 Unspecified right bundle-branch block: Secondary | ICD-10-CM

## 2019-04-26 DIAGNOSIS — E785 Hyperlipidemia, unspecified: Secondary | ICD-10-CM

## 2019-04-26 NOTE — Progress Notes (Signed)
Virtual Visit via Telephone Note   This visit type was conducted due to national recommendations for restrictions regarding the COVID-19 Pandemic (e.g. social distancing) in an effort to limit this patient's exposure and mitigate transmission in our community.  Due to her co-morbid illnesses, this patient is at least at moderate risk for complications without adequate follow up.  This format is felt to be most appropriate for this patient at this time.  The patient did not have access to video technology/had technical difficulties with video requiring transitioning to audio format only (telephone).  All issues noted in this document were discussed and addressed.  No physical exam could be performed with this format.  Please refer to the patient's chart for her  consent to telehealth for [facility].   Date:  04/26/2019   ID:  Andrea [name], DOB Apr 29, 1945, MRN [id number]  Patient Location: Home Provider Location: Home  PCP:  Mar Daring, PA-C  Cardiologist:  Ida Rogue, MD  Electrophysiologist:  None   Evaluation Performed:  Follow-Up Visit  Chief Complaint:  Follow up of CAD and HTN  History of Present Illness:    Andrea [name] is a 74 y.o. female with HTN, prior CVA/TIA, prior tobacco abuse, RBBB, CAD s/p MI in 99991111, systolic heart failure, HLD, DM 2, PAD.  Prior EF 45-50% in 2014.  Most recent echo March 2019 with EF 55 to 60%.  Stress test March 2018 with fixed defect mid to distal anterior wall consistent with previous MI.  She was seen by vascular 03/22/2019.  Testing shows 0.79 ABI on the right and 199 ABI on the left as part of intricately screening.  She denies claudication-like symptoms.  ABI in the office 125 on the right and 1.04 on the left.  TBI 0.77 on the right and 0.91 on the left.  Bilateral lower extremity duplex showing biphasic waveforms.  RLE had biphasic/triphasic waveforms.  Noted mild PAD.  Recommended for annual follow-up with  ABI.  Recent BPs at office visits:   03/22/19 - 148/73  04/05/19 - 129/76  Tells me she has been mostly wheelchair bound since her hip surgery. She worked with physical therapy, but completed this. Uses a [employer]ros[employer] around the home.  We discussed that maintaining her weight would likely need to be done via an increased focus on diet since she cannot perform increased activity.  Denies chest pain, pressure, tightness.  No shortness of breath or dyspnea on exertion.  Denies edema, orthopnea, PND.  She has no complaints related to her heart today.  Reports checking her blood pressure intermittently at home.  She was encouraged to check at least once a week.  The patient does not have symptoms concerning for COVID-19 infection (fever, chills, cough, or new shortness of breath).    Past Medical History:  Diagnosis Date  . Bronchitis   . Chickenpox   . Coronary artery disease   . Depression   . Diabetes mellitus type 2, uncomplicated ([place])   . Diabetes mellitus without complication ([place])   . Diabetic retinopathy ([place])   . Dupuytren contracture   . Frequent urinary tract infections   . GERD (gastroesophageal reflux disease)   . History of hand surgery 2011   left hand  . Hyperlipidemia, unspecified   . Irritable bowel syndrome   . MI (myocardial infarction) ([place]) 1994  . Neuromuscular disorder ([place])    nerve pain in back and legs  . Osteoporosis   . Pneumonia 2014  . Stroke (  [place])   . TIA (transient ischemic attack)   . Vitamin D deficiency, unspecified   . Wheezing    Past Surgical History:  Procedure Laterality Date  . CARDIAC CATHETERIZATION  1994  . COLONOSCOPY     x3  . COLONOSCOPY WITH PROPOFOL N/A 02/08/2019   Procedure: COLONOSCOPY WITH PROPOFOL;  Surgeon: [name], MD;  Location: [facility] ENDOSCOPY;  Service: Gastroenterology;  Laterality: N/A;  . CORONARY ANGIOPLASTY  1994  . EYE SURGERY     cataracts  . HAND SURGERY Left 2011   left hand release of contractures  .  HIP FRACTURE SURGERY  11/2013  . PR COLSC FLX W/REMOVAL LESION BY HOT BX FORCEPS   08/21/2015   Procedure: COLONOSCOPY, FLEXIBLE, PROXIMAL TO SPLENIC FLEXURE; W/REMOVAL TUMOR/POLYP/OTHER LESION, HOT BX FORCEP/CAUTE; Surgeon: Carlena Hurl, MD; Location: OR [place]; Service: General Surgery     Current Meds  Medication Sig  . albuterol (VENTOLIN HFA) 108 (90 Base) MCG/ACT inhaler Inhale 2 puffs into the lungs every 6 (six) hours as needed for wheezing or shortness of breath.  . Alcohol Swabs (ALCOHOL PREP) PADS Use twice daily prior to SQ injection of insulin to clean skin  . aspirin EC 81 MG tablet Take 81 mg daily by mouth.  [name] Kitchen atorvastatin (LIPITOR) 80 MG tablet TAKE 1 TABLET BY MOUTH EVERY DAY  . Calcium Carb-Cholecalciferol ([facility] CALCIUM 600+D) 600-800 MG-UNIT TABS Take 1 tablet by mouth once daily  . Cyanocobalamin (B-12) 1000 MCG CAPS Take 1,000 mcg by mouth daily.   [name] Kitchen HYDROcodone-acetaminophen (NORCO) 10-325 MG tablet Take 1 tablet by mouth every 8 (eight) hours as needed.  . insulin glargine (LANTUS) 100 UNIT/ML injection Inject 0.65 mLs (65 Units total) into the skin daily with supper.  . Insulin Syringe-Needle U-100 29G 1 ML MISC Use to inject 65 units Lantus SQ BIDWC  . losartan (COZAAR) 25 MG tablet TAKE 1 TABLET BY MOUTH EVERY DAY  . metoprolol succinate (TOPROL-XL) 25 MG 24 hr tablet Take 1 tablet (25 mg total) by mouth daily.  . Multiple Vitamins-Minerals (ICAPS AREDS 2 PO) Take 1 capsule by mouth 2 (two) times daily.   . nitrofurantoin (MACRODANTIN) 100 MG capsule Take 100 mg at bedtime by mouth.  . Omega-3 Fatty Acids (FISH OIL) 1200 MG CAPS Take 1 capsule (1,200 mg total) by mouth daily.  [name] Kitchen omeprazole (PRILOSEC) 40 MG capsule Take 1 capsule (40 mg total) by mouth daily.  Glory Rosebush DELICA LANCETS 99991111 MISC TO CHECK BLOOD SUGAR DAILY  . ONETOUCH VERIO test strip TO CHECK BLOOD SUGAR ONCE DAILY.  [name] Kitchen oxybutynin (DITROPAN-XL) 10 MG 24 hr tablet TAKE 1 TABLET BY MOUTH  EVERYDAY AT BEDTIME  . sertraline (ZOLOFT) 25 MG tablet Take 1 tablet (25 mg) by mouth once daily as needed  . TRULICITY 1.5 0000000 SOPN INJECT 1.5MG  INTO THE SKIN ONCE A WEEK AS DIRECTED  . Vitamin D, Ergocalciferol, (DRISDOL) 1.25 MG (50000 UT) CAPS capsule TAKE ONE CAPSULE BY MOUTH ONCE WEEKLY ON THE SAME DAY EACH WEEK (ON FRIDAYS)     Allergies:   Metformin and related and Sulfa antibiotics   Social History   Tobacco Use  . Smoking status: Former Smoker    Packs/day: 1.00    Years: 30.00    Pack years: 30.00    Types: Cigarettes    Quit date: 07/17/2012    Years since quitting: 6.7  . Smokeless tobacco: Never Used  . Tobacco comment:    Substance Use Topics  . Alcohol use:  Not Currently    Alcohol/week: 0.0 standard drinks  . Drug use: No     Family Hx: The patient's family history includes AAA (abdominal aortic aneurysm) in her daughter; Anxiety disorder in her daughter; Arthritis in her daughter; Asthma in her brother; Cataracts in her mother; Depression in her daughter and mother; Diabetes in her daughter; Heart disease in her mother; Hyperlipidemia in her daughter and daughter; Hypertension in her daughter and mother; Plantar fasciitis in her daughter; Stroke in her mother.  ROS:   Please see the history of present illness.    Review of Systems  Constitution: Negative for chills, fever and malaise/fatigue.  Cardiovascular: Negative for chest pain, dyspnea on exertion, leg swelling, near-syncope, orthopnea, palpitations and syncope.  Respiratory: Negative for cough, shortness of breath and wheezing.   Musculoskeletal:       (+) leg pain and hip pain  Gastrointestinal: Negative for nausea and vomiting.  Neurological: Negative for dizziness, light-headedness and weakness.   All other systems reviewed and are negative.  Labs/Other Tests and Data Reviewed:    EKG:  No ECG reviewed.  Recent Labs: 12/28/2018: ALT 14; BUN 27; Creatinine, Ser 1.02; Hemoglobin 13.1;  Platelets 170; Potassium 4.3; Sodium 140; TSH 1.220   Recent Lipid Panel Lab Results  Component Value Date/Time   CHOL 123 12/28/2018 04:26 PM   TRIG 115 12/28/2018 04:26 PM   HDL 64 12/28/2018 04:26 PM   CHOLHDL 1.9 12/28/2018 04:26 PM   CHOLHDL 2.1 12/16/2016 03:39 PM   LDLCALC 39 12/28/2018 04:26 PM   LDLCALC 56 12/16/2016 03:39 PM    Wt Readings from Last 3 Encounters:  04/26/19 154 lb (69.9 kg)  04/05/19 160 lb 6.4 oz (72.8 kg)  03/22/19 161 lb (73 kg)     Objective:    Vital Signs:  BP 129/75   Pulse 96   Ht 5' (1.524 m)   Wt 154 lb (69.9 kg)   BMI 30.08 kg/m    VITAL SIGNS:  reviewed  ASSESSMENT & PLAN:    1. CAD - Stable with no anginal symptoms. Continue GDMT aspirin, beta blocker, statin.  No indication for ischemic evaluation at this time. 2. HTN -BP well controlled continue present antihypertensive regimen including losartan 25 mg daily and metoprolol 25 mg daily. 3. DM2 -follows with her PCP.  If additional agent needed could consider SGLT2 I for cardioprotective benefit. 4. HLD, LDL goal <70 - 12/2018 LDL 39.  Continue high intensity atorvastatin. 5. HFrEF - Most recent EF improved to normal.  No symptoms concerning for worsening heart failure.  No edema, shortness of breath, DOE.  Continue beta-blocker and ARB.  No indication for diuretic at this time. 6. RBBB - No symptoms including lightheadedness, dizziness.  We will update EKG at her next office visit. 7. Obesity -weight loss via diet and exercise encouraged.  Due to hip and leg pain in the setting of hip surgery discussed that much of her weight loss could need to be via diet changes.  COVID-19 Education: The signs and symptoms of COVID-19 were discussed with the patient and how to seek care for testing (follow up with PCP or arrange E-visit).  The importance of social distancing was discussed today.  Time:   Today, I have spent 15 minutes with the patient with telehealth technology discussing the  above problems.    Medication Adjustments/Labs and Tests Ordered: Current medicines are reviewed at length with the patient today.  Concerns regarding medicines are outlined above.   Tests  Ordered: No orders of the defined types were placed in this encounter.  Medication Changes: No orders of the defined types were placed in this encounter.   Follow Up:  In Person in 6 month(s) with Dr. Rockey Situ  Signed, Loel Dubonnet, NP  04/26/2019 10:32 AM    [place]

## 2019-04-26 NOTE — Patient Instructions (Signed)
Medication Instructions:  No medication changes today.  *If you need a refill on your cardiac medications before your next appointment, please call your pharmacy*  Lab Work: No lab work ordered today. Recent labs with your primary care provider looked great.  Testing/Procedures: None ordered today.  We will plan to do an EKG when you see [place] in the office the next time.  Follow-Up: At [facility], you and your health needs are our priority.  As part of our continuing mission to provide you with exceptional heart care, we have created designated Provider Care Teams.  These Care Teams include your primary Cardiologist (physician) and Advanced Practice Providers (APPs -  Physician Assistants and Nurse Practitioners) who all work together to provide you with the care you need, when you need it.  Your next appointment:   6 month(s)  The format for your next appointment:   In Person  Provider:   Ida Rogue, MD  Other Instructions  It was a pleasure to speak with you today.  Thank you for your flexibility being willing to switch to a virtual office visit rather than an office visit with the weather.  So glad you are feeling well!  Your blood pressure looks great.  We will plan to continue your present blood pressure medications.  Your cholesterol numbers are wonderful in October when checked by her primary care provider.  Try to be as active as you can but I understand that this is difficult due to your hip and leg pain.  Likely will just have to be a little bit more mindful of what you eat.  If you are buying seeds or prepackaged food would recommend looking for things with a "low-sodium" label. Additional heart healthy diet information included below.   DASH Eating Plan DASH stands for "Dietary Approaches to Stop Hypertension." The DASH eating plan is a healthy eating plan that has been shown to reduce high blood pressure (hypertension). It may also reduce your risk for type 2  diabetes, heart disease, and stroke. The DASH eating plan may also help with weight loss. What are tips for following this plan?  General guidelines  Avoid eating more than 2,300 mg (milligrams) of salt (sodium) a day. If you have hypertension, you may need to reduce your sodium intake to 1,500 mg a day.  Limit alcohol intake to no more than 1 drink a day for nonpregnant women and 2 drinks a day for men. One drink equals 12 oz of beer, 5 oz of wine, or 1 oz of hard liquor.  Work with your health care provider to maintain a healthy body weight or to lose weight. Ask what an ideal weight is for you.  Get at least 30 minutes of exercise that causes your heart to beat faster (aerobic exercise) most days of the week. Activities may include walking, swimming, or biking.  Work with your health care provider or diet and nutrition specialist (dietitian) to adjust your eating plan to your individual calorie needs. Reading food labels   Check food labels for the amount of sodium per serving. Choose foods with less than 5 percent of the Daily Value of sodium. Generally, foods with less than 300 mg of sodium per serving fit into this eating plan.  To find whole grains, look for the word "whole" as the first word in the ingredient list. Shopping  Buy products labeled as "low-sodium" or "no salt added."  Buy fresh foods. Avoid canned foods and premade or frozen meals. Cooking  Avoid adding salt when cooking. Use salt-free seasonings or herbs instead of table salt or sea salt. Check with your health care provider or pharmacist before using salt substitutes.  Do not fry foods. Cook foods using healthy methods such as baking, boiling, grilling, and broiling instead.  Cook with heart-healthy oils, such as olive, canola, soybean, or sunflower oil. Meal planning  Eat a balanced diet that includes: ? 5 or more servings of fruits and vegetables each day. At each meal, try to fill half of your plate with  fruits and vegetables. ? Up to 6-8 servings of whole grains each day. ? Less than 6 oz of lean meat, poultry, or fish each day. A 3-oz serving of meat is about the same size as a deck of cards. One egg equals 1 oz. ? 2 servings of low-fat dairy each day. ? A serving of nuts, seeds, or beans 5 times each week. ? Heart-healthy fats. Healthy fats called Omega-3 fatty acids are found in foods such as flaxseeds and coldwater fish, like sardines, salmon, and mackerel.  Limit how much you eat of the following: ? Canned or prepackaged foods. ? Food that is high in trans fat, such as fried foods. ? Food that is high in saturated fat, such as fatty meat. ? Sweets, desserts, sugary drinks, and other foods with added sugar. ? Full-fat dairy products.  Do not salt foods before eating.  Try to eat at least 2 vegetarian meals each week.  Eat more home-cooked food and less restaurant, buffet, and fast food.  When eating at a restaurant, ask that your food be prepared with less salt or no salt, if possible. What foods are recommended? The items listed may not be a complete list. Talk with your dietitian about what dietary choices are best for you. Grains Whole-grain or whole-wheat bread. Whole-grain or whole-wheat pasta. Brown rice. Modena Morrow. Bulgur. Whole-grain and low-sodium cereals. Pita bread. Low-fat, low-sodium crackers. Whole-wheat flour tortillas. Vegetables Fresh or frozen vegetables (raw, steamed, roasted, or grilled). Low-sodium or reduced-sodium tomato and vegetable juice. Low-sodium or reduced-sodium tomato sauce and tomato paste. Low-sodium or reduced-sodium canned vegetables. Fruits All fresh, dried, or frozen fruit. Canned fruit in natural juice (without added sugar). Meat and other protein foods Skinless chicken or [place]. Ground chicken or [place]. Pork with fat trimmed off. Fish and seafood. Egg whites. Dried beans, peas, or lentils. Unsalted nuts, nut butters, and seeds.  Unsalted canned beans. Lean cuts of beef with fat trimmed off. Low-sodium, lean deli meat. Dairy Low-fat (1%) or fat-free (skim) milk. Fat-free, low-fat, or reduced-fat cheeses. Nonfat, low-sodium ricotta or cottage cheese. Low-fat or nonfat yogurt. Low-fat, low-sodium cheese. Fats and oils Soft margarine without trans fats. Vegetable oil. Low-fat, reduced-fat, or light mayonnaise and salad dressings (reduced-sodium). Canola, safflower, olive, soybean, and sunflower oils. Avocado. Seasoning and other foods Herbs. Spices. Seasoning mixes without salt. Unsalted popcorn and pretzels. Fat-free sweets. What foods are not recommended? The items listed may not be a complete list. Talk with your dietitian about what dietary choices are best for you. Grains Baked goods made with fat, such as croissants, muffins, or some breads. Dry pasta or rice meal packs. Vegetables Creamed or fried vegetables. Vegetables in a cheese sauce. Regular canned vegetables (not low-sodium or reduced-sodium). Regular canned tomato sauce and paste (not low-sodium or reduced-sodium). Regular tomato and vegetable juice (not low-sodium or reduced-sodium). Angie Fava. Olives. Fruits Canned fruit in a light or heavy syrup. Fried fruit. Fruit in cream or butter sauce. Meat  and other protein foods Fatty cuts of meat. Ribs. Fried meat. Berniece Salines. Sausage. Bologna and other processed lunch meats. Salami. Fatback. Hotdogs. Bratwurst. Salted nuts and seeds. Canned beans with added salt. Canned or smoked fish. Whole eggs or egg yolks. Chicken or [place] with skin. Dairy Whole or 2% milk, cream, and half-and-half. Whole or full-fat cream cheese. Whole-fat or sweetened yogurt. Full-fat cheese. Nondairy creamers. Whipped toppings. Processed cheese and cheese spreads. Fats and oils Butter. Stick margarine. Lard. Shortening. Ghee. Bacon fat. Tropical oils, such as coconut, palm kernel, or palm oil. Seasoning and other foods Salted popcorn and  pretzels. Onion salt, garlic salt, seasoned salt, table salt, and sea salt. Worcestershire sauce. Tartar sauce. Barbecue sauce. Teriyaki sauce. Soy sauce, including reduced-sodium. Steak sauce. Canned and packaged gravies. Fish sauce. Oyster sauce. Cocktail sauce. Horseradish that you find on the shelf. Ketchup. Mustard. Meat flavorings and tenderizers. Bouillon cubes. Hot sauce and Tabasco sauce. Premade or packaged marinades. Premade or packaged taco seasonings. Relishes. Regular salad dressings. Where to find more information:  National Heart, Lung, and [place]: https://wilson-eaton.com/  American Heart Association: www.heart.org Summary  The DASH eating plan is a healthy eating plan that has been shown to reduce high blood pressure (hypertension). It may also reduce your risk for type 2 diabetes, heart disease, and stroke.  With the DASH eating plan, you should limit salt (sodium) intake to 2,300 mg a day. If you have hypertension, you may need to reduce your sodium intake to 1,500 mg a day.  When on the DASH eating plan, aim to eat more fresh fruits and vegetables, whole grains, lean proteins, low-fat dairy, and heart-healthy fats.  Work with your health care provider or diet and nutrition specialist (dietitian) to adjust your eating plan to your individual calorie needs. This information is not intended to replace advice given to you by your health care provider. Make sure you discuss any questions you have with your health care provider. Document Revised: 02/04/2017 Document Reviewed: 02/16/2016 Elsevier Patient Education  2020 [employer].

## 2019-04-27 ENCOUNTER — Encounter (INDEPENDENT_AMBULATORY_CARE_PROVIDER_SITE_OTHER): Payer: Self-pay

## 2019-04-30 ENCOUNTER — Other Ambulatory Visit: Payer: Self-pay | Admitting: Physician Assistant

## 2019-04-30 DIAGNOSIS — E1142 Type 2 diabetes mellitus with diabetic polyneuropathy: Secondary | ICD-10-CM

## 2019-04-30 NOTE — Telephone Encounter (Signed)
Refill request for ONE TOUCH DELICA [id number] LANCETS. Prescription expired on 01/29/19

## 2019-05-17 ENCOUNTER — Other Ambulatory Visit: Payer: Self-pay | Admitting: Physician Assistant

## 2019-05-17 DIAGNOSIS — M4316 Spondylolisthesis, lumbar region: Secondary | ICD-10-CM

## 2019-05-17 MED ORDER — HYDROCODONE-ACETAMINOPHEN 10-325 MG PO TABS: 1.0000 | ORAL_TABLET | Freq: Three times a day (TID) | ORAL | 0 refills | Status: DC | PRN

## 2019-05-17 NOTE — Telephone Encounter (Signed)
Medication Refill - Medication: HYDROcodone-acetaminophen (NORCO) 10-325 MG tablet    Has the patient contacted their pharmacy? No. (Agent: If no, request that the patient contact the pharmacy for the refill.) (Agent: If yes, when and what did the pharmacy advise?)  Preferred Pharmacy (with phone number or street name):  [id number] [place], [place] Phone:  [phone]  Fax:  [phone]       Agent: Please be advised that RX refills may take up to 3 business days. We ask that you follow-up with your pharmacy.

## 2019-05-17 NOTE — Telephone Encounter (Signed)
Requested medication (s) are due for refill today: yes  Requested medication (s) are on the active medication list: yes  Last refill:  04/19/19  Future visit scheduled: yes  Notes to clinic:  Not delegated    Requested Prescriptions  Pending Prescriptions Disp Refills   HYDROcodone-acetaminophen (NORCO) 10-325 MG tablet 90 tablet 0    Sig: Take 1 tablet by mouth every 8 (eight) hours as needed.      Not Delegated - Analgesics:  Opioid Agonist Combinations Failed - 05/17/2019  3:49 PM      Failed - This refill cannot be delegated      Failed - Urine Drug Screen completed in last 360 days.      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, with long-term current use of insulin, macular edema presence unspecified, unspecified laterality [facility])   [place], [place], PA-C   1 year ago 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

## 2019-05-25 ENCOUNTER — Other Ambulatory Visit: Payer: Self-pay | Admitting: Physician Assistant

## 2019-05-25 DIAGNOSIS — I10 Essential (primary) hypertension: Secondary | ICD-10-CM

## 2019-05-26 ENCOUNTER — Other Ambulatory Visit: Payer: Self-pay | Admitting: Physician Assistant

## 2019-05-26 DIAGNOSIS — I10 Essential (primary) hypertension: Secondary | ICD-10-CM

## 2019-05-28 ENCOUNTER — Other Ambulatory Visit: Payer: Self-pay | Admitting: Physician Assistant

## 2019-05-28 DIAGNOSIS — I10 Essential (primary) hypertension: Secondary | ICD-10-CM

## 2019-05-28 MED ORDER — METOPROLOL SUCCINATE ER 25 MG PO TB24: 25.0000 mg | ORAL_TABLET | Freq: Every day | ORAL | 3 refills | Status: DC

## 2019-05-28 NOTE — Telephone Encounter (Signed)
Pt called stating that she is completely out of her metoprolol. Pt states that she was supposed to be taking a half pill and she has been taking the whole thing. Please advise.      [id number] [place], [place]  [place] [address]  Phone: [phone] Fax: [phone]  Not a 24 hour pharmacy; exact hours not known.

## 2019-05-28 NOTE — Telephone Encounter (Signed)
Requested medication (s) are due for refill today: yes  Requested medication (s) are on the active medication list: yes  Last refill:  11/27/18 by cardiology  Future visit scheduled:yes  Notes to clinic:  Patient states she is to have taken half dose. I see nothing in notes that indicate less than 25 mg daily. Please advise patient.    Requested Prescriptions  Pending Prescriptions Disp Refills   metoprolol succinate (TOPROL-XL) 25 MG 24 hr tablet 90 tablet 3    Sig: Take 1 tablet (25 mg total) by mouth daily.      Cardiovascular:  Beta Blockers Passed - 05/28/2019  9:23 AM      Passed - Last BP in normal range    BP Readings from Last 1 Encounters:  04/26/19 129/75          Passed - Last Heart Rate in normal range    Pulse Readings from Last 1 Encounters:  04/26/19 96          Passed - Valid encounter within last 6 months    Recent Outpatient Visits           1 month ago Type 2 diabetes mellitus with diabetic peripheral angiopathy without gangrene, with long-term current use of insulin [facility])   [facility] [place], [place], [place]   5 months ago Annual physical exam   [facility] [name] M, PA-C   9 months ago 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, with long-term current use of insulin, macular edema presence unspecified, unspecified laterality [facility])   [place], [place], PA-C   1 year ago 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

## 2019-05-29 ENCOUNTER — Telehealth: Payer: Self-pay | Admitting: Physician Assistant

## 2019-05-29 NOTE — Telephone Encounter (Signed)
metoprolol succinate (TOPROL-XL) 25 MG 24 hr tablet    Patient calling to inquire what the correct dosage of this medication is. She states previously she was taking half a tablet.

## 2019-05-30 ENCOUNTER — Telehealth: Payer: Self-pay

## 2019-05-30 ENCOUNTER — Telehealth: Payer: Self-pay | Admitting: Cardiovascular Disease

## 2019-05-30 NOTE — Telephone Encounter (Signed)
Pt c/o medication issue:  1. Name of Medication: metoprolol   2. How are you currently taking this medication (dosage and times per day)? 25 MG 1 tablet daily   3. Are you having a reaction (difficulty breathing--STAT)? HR has been in the 90s  4. What is your medication issue? Patient calling, states that she use to take 0.5 tablet of metoprolol but at one point was changed to 1 tablet a day.  Patient is confused and would like to clarify on what amount she should be taking.  Please call to discuss.

## 2019-05-30 NOTE — Telephone Encounter (Signed)
Noted.   Thank you Jiles Garter for doing the research there.  JB

## 2019-05-30 NOTE — Telephone Encounter (Signed)
Pt would like a call today so she can know what her dose should be. She was taking half of tablet but now her Rx says 1 tablet/ please advise about med listed below

## 2019-05-30 NOTE — Telephone Encounter (Signed)
Spoke with patient about her medication question. Reviewed previous notes and medications and confirmed that she should be taking metoprolol succinate 25 mg 1 tablet once daily. Had her get pill bottle and spell out name on bottle so that I could confirm her medication. She read back metoprolol succinate 25 mg and then instructed her that she should be taking one whole tablet once daily. She verbalized understanding with no further questions at this time.

## 2019-05-30 NOTE — Telephone Encounter (Signed)
FYI-Patient confirmed with Dr Rockey Situ that he wants her on 1 whole pill daily.     Copied from [place] [id number]. Topic: General - Other >> May 30, 2019 10:45 AM Yvette Rack wrote: Reason for CRM: Pt stated she saw her heart doctor and she was instructed to take 1 pill daily of the metoprolol succinate (TOPROL-XL) 25 MG 24 hr tablet. Pt requested that the clinical staff be made aware.

## 2019-05-30 NOTE — Telephone Encounter (Signed)
FYI  I spoke with the patient after reviewing the chart to see where the confusion may have occurred.  In August 2020 Dr Donivan Scull office had a conversation via phone with the patient suggesting she go to 1 pill daily.  Patient states that she went up to 1 whole one but when she ran out of pills the Rx that was filled was apparently the old one and it said 1/2 daily.  Not sure if Dr Rockey Situ sent in a new Rx at the time of the conversation.   Advised patient we should probably see what Dr Rockey Situ wanted her to do.  The last few visits she had we various providers revealed heart rate staying around the low 90's.   She is going to call Dr Donivan Scull office and explain that she has only been taking 1/2 tab daily and she will advise [place] what he tells her to do.

## 2019-06-15 ENCOUNTER — Other Ambulatory Visit: Payer: Self-pay | Admitting: Physician Assistant

## 2019-06-15 DIAGNOSIS — M4316 Spondylolisthesis, lumbar region: Secondary | ICD-10-CM

## 2019-06-15 MED ORDER — HYDROCODONE-ACETAMINOPHEN 10-325 MG PO TABS: 1.0000 | ORAL_TABLET | Freq: Three times a day (TID) | ORAL | 0 refills | Status: DC | PRN

## 2019-06-15 NOTE — Telephone Encounter (Signed)
Patient called back and stated that she would like her request expedited and is out of her medication. Please advise

## 2019-06-15 NOTE — Telephone Encounter (Signed)
Copied from [place] [id number]. Topic: Quick Communication - Rx Refill/Question >> Jun 15, 2019  9:19 AM Rainey Pines A wrote: Medication: HYDROcodone-acetaminophen (NORCO) 10-325 MG tablet   Has the patient contacted their pharmacy?yes (Agent: If no, request that the patient contact the pharmacy for the refill.) (Agent: If yes, when and what did the pharmacy advise?)contact PCP  Preferred Pharmacy (with phone number or street name): [id number] - [place], [place]  Phone:  [phone] Fax:  [phone]     Agent: Please be advised that RX refills may take up to 3 business days. We ask that you follow-up with your pharmacy.

## 2019-06-22 ENCOUNTER — Other Ambulatory Visit: Payer: Self-pay | Admitting: Physician Assistant

## 2019-06-22 DIAGNOSIS — N3281 Overactive bladder: Secondary | ICD-10-CM

## 2019-06-29 NOTE — Progress Notes (Signed)
Established patient visit   Patient: Andrea [name]   DOB: [birth year]   74 y.o. Female  MRN: [id number] Visit Date: 07/05/2019  Today's healthcare provider: Mar Daring, PA-C   No chief complaint on file.  Subjective    HPI Diabetes Mellitus Type II, Follow-up  Lab Results  Component Value Date   HGBA1C 8.1 (A) 07/05/2019   HGBA1C 7.6 (A) 04/05/2019   HGBA1C 7.6 (H) 12/28/2018   Last seen for diabetes 3 months ago.  Management since then includes continue Lantus 65 units daily, Trulicity 1.5mg /0.7mL. Continue lifestyle management. She reports excellent compliance with treatment. She is not having side effects.  Symptoms: No fatigue No foot ulcerations No appetite changes No nausea No paresthesia (numbness or tingling) of the feet  No polydipsia (excessive thirst) No polyuria (frequent urination) No visual disturbances  No vomiting  Home blood sugar records: fasting range: 110's-160's  Episodes of hypoglycemia? No    Current insulin regiment: Lantus 65 units daily and Trulicity 1.5mg /0.85mL Current exercise: none Current diet habits: in general, an "unhealthy" diet  Pertinent Labs: Lab Results  Component Value Date   CHOL 123 12/28/2018   HDL 64 12/28/2018   LDLCALC 39 12/28/2018   TRIG 115 12/28/2018   CHOLHDL 1.9 12/28/2018   Lab Results  Component Value Date   NA 140 12/28/2018   K 4.3 12/28/2018   CO2 23 12/28/2018   GLUCOSE 91 12/28/2018   BUN 27 12/28/2018   CREATININE 1.02 (H) 12/28/2018   CALCIUM 10.5 (H) 12/28/2018   GFRNONAA 55 (L) 12/28/2018   GFRAA 63 12/28/2018     Wt Readings from Last 3 Encounters:  07/05/19 159 lb (72.1 kg)  04/26/19 154 lb (69.9 kg)  04/05/19 160 lb 6.4 oz (72.8 kg)    -----------------------------------------------------------------------------------------   Patient Active Problem List   Diagnosis Date Noted  . PAD (peripheral artery disease) ([place]) 12/28/2018  . Moderate episode of recurrent  major depressive disorder ([place]) 12/19/2017  . Leg length discrepancy 09/21/2017  . CAD (coronary artery disease), native coronary artery 01/24/2017  . Drug-induced constipation 01/06/2017  . Spinal stenosis of lumbar region 01/06/2017  . Spondylolisthesis of lumbar region 12/30/2016  . Obese 04/16/2015  . History of colon polyps 11/23/2012  . Bundle branch block, 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  . 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 Encounter for long-term (current) use of aspirin 11/23/2012  . Status post percutaneous transluminal coronary angioplasty 11/23/2012  . Transient ischemic attack (TIA), and cerebral infarction without residual deficits(V12.54) 11/23/2012  . Absolute anemia 08/17/2012  . Chronic obstructive pulmonary disease ([place]) 08/17/2012  . HLD (hyperlipidemia) 08/17/2012  . OP (osteoporosis) 08/17/2012  . 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 edema, with long-term current use of insulin ([place]) 08/17/2012  . Chronic systolic heart failure ([place]) 07/30/2012   Past Medical History:  Diagnosis Date  . Bronchitis   . Chickenpox   . Coronary artery disease   . Depression   . Diabetes mellitus type 2, uncomplicated ([place])   . Diabetes mellitus without complication ([place])   . Diabetic retinopathy ([place])   . Dupuytren contracture   . Frequent urinary tract infections   . GERD (gastroesophageal reflux disease)   . History of hand surgery 2011   left hand  . Hyperlipidemia, unspecified   . Irritable bowel syndrome   . MI (myocardial infarction) ([place]) 1994  . Neuromuscular disorder ([place])  nerve pain in back and legs  . Osteoporosis   . Pneumonia 2014  . Stroke ([place])   . TIA (transient ischemic attack)   . Vitamin D deficiency, unspecified   . Wheezing    Allergies  Allergen Reactions  . Metformin And Related Diarrhea  . Sulfa Antibiotics  Rash        Medications: Outpatient Medications Prior to Visit  Medication Sig  . albuterol (VENTOLIN HFA) 108 (90 Base) MCG/ACT inhaler Inhale 2 puffs into the lungs every 6 (six) hours as needed for wheezing or shortness of breath.  . Alcohol Swabs (ALCOHOL PREP) PADS Use twice daily prior to SQ injection of insulin to clean skin  . aspirin EC 81 MG tablet Take 81 mg daily by mouth.  [name] Kitchen atorvastatin (LIPITOR) 80 MG tablet TAKE 1 TABLET BY MOUTH EVERY DAY  . Calcium Carb-Cholecalciferol ([facility] CALCIUM 600+D) 600-800 MG-UNIT TABS Take 1 tablet by mouth once daily  . Cyanocobalamin (B-12) 1000 MCG CAPS Take 1,000 mcg by mouth daily.   [name] Kitchen HYDROcodone-acetaminophen (NORCO) 10-325 MG tablet Take 1 tablet by mouth every 8 (eight) hours as needed.  . insulin glargine (LANTUS) 100 UNIT/ML injection Inject 0.65 mLs (65 Units total) into the skin daily with supper.  . Insulin Syringe-Needle U-100 29G 1 ML MISC Use to inject 65 units Lantus SQ BIDWC  . losartan (COZAAR) 25 MG tablet TAKE 1 TABLET BY MOUTH EVERY DAY  . metoprolol succinate (TOPROL-XL) 25 MG 24 hr tablet Take 1 tablet (25 mg total) by mouth daily.  . Multiple Vitamins-Minerals (ICAPS AREDS 2 PO) Take 1 capsule by mouth 2 (two) times daily.   . Omega-3 Fatty Acids (FISH OIL) 1200 MG CAPS Take 1 capsule (1,200 mg total) by mouth daily.  [name] Kitchen omeprazole (PRILOSEC) 40 MG capsule Take 1 capsule (40 mg total) by mouth daily.  Glory Rosebush Delica Lancets 99991111 MISC To check blood sugar daily  DX: E11.9  . ONETOUCH VERIO test strip TO CHECK BLOOD SUGAR ONCE DAILY.  [name] Kitchen oxybutynin (DITROPAN-XL) 10 MG 24 hr tablet TAKE 1 TABLET BY MOUTH EVERYDAY AT BEDTIME  . sertraline (ZOLOFT) 25 MG tablet Take 1 tablet (25 mg) by mouth once daily as needed  . TRULICITY 1.5 0000000 SOPN INJECT 1.5MG  INTO THE SKIN ONCE A WEEK AS DIRECTED  . Vitamin D, Ergocalciferol, (DRISDOL) 1.25 MG (50000 UT) CAPS capsule TAKE ONE CAPSULE BY MOUTH ONCE WEEKLY ON THE SAME DAY EACH WEEK  (ON FRIDAYS)  . nitrofurantoin (MACRODANTIN) 100 MG capsule Take 100 mg at bedtime by mouth.   No facility-administered medications prior to visit.    Review of Systems  Constitutional: Negative.   Respiratory: Negative.  Negative for chest tightness and shortness of breath.   Cardiovascular: Negative.  Negative for chest pain, palpitations and leg swelling.  Hematological: Negative.         Objective    BP 124/68 (BP Location: Left Arm, Patient Position: Sitting, Cuff Size: Normal)   Pulse 93   Temp (!) 96.9 F (36.1 C) (Temporal)   Resp 16   Wt 159 lb (72.1 kg)   BMI 31.05 kg/m  BP Readings from Last 3 Encounters:  07/05/19 124/68  04/26/19 129/75  04/05/19 129/75   Wt Readings from Last 3 Encounters:  07/05/19 159 lb (72.1 kg)  04/26/19 154 lb (69.9 kg)  04/05/19 160 lb 6.4 oz (72.8 kg)      Physical Exam Vitals reviewed.  Constitutional:      General: She is not  in acute distress.    Appearance: Normal appearance. She is well-developed. She is not ill-appearing or diaphoretic.  Cardiovascular:     Rate and Rhythm: Normal rate and regular rhythm.     Heart sounds: Normal heart sounds. No murmur. No friction rub. No gallop.   Pulmonary:     Effort: Pulmonary effort is normal. No respiratory distress.     Breath sounds: Normal breath sounds. No wheezing or rales.  Musculoskeletal:     Cervical back: Normal range of motion and neck supple.  Neurological:     Mental Status: She is alert.       Results for orders placed or performed in visit on 07/05/19  POCT glycosylated hemoglobin (Hb A1C)  Result Value Ref Range   Hemoglobin A1C 8.1 (A) 4.0 - 5.6 %   Est. average glucose Bld gHb Est-mCnc 186     Assessment & Plan    1. Type 2 diabetes mellitus with diabetic peripheral angiopathy without gangrene, with long-term current use of insulin (HCC) A1c increased from 7.6 to 8.1. Continue current regimen of Lantus 65 units daily and Trulicity 1.5mg  once weekly.  Will f/u in 3 months. If A1c increases again will increase trulicity to 3mg .   2. Intertrigo Noted in skin folds under breast and in groin. Nystatin powder given as below. Call if not improving.  - nystatin (MYCOSTATIN/NYSTOP) powder; Apply 1 application topically 3 (three) times daily.  Dispense: 15 g; Refill: 0   No follow-ups on file.      Reynolds Bowl, PA-C, have reviewed all documentation for this visit. The documentation on 07/05/19 for the exam, diagnosis, procedures, and orders are all accurate and complete.   Rubye Beach  [facility] [phone] (phone) [phone] (fax)  [place]

## 2019-07-03 NOTE — Progress Notes (Signed)
Pt did not answer her telephonic AWV call. This encounter was created in error - please disregard.

## 2019-07-04 ENCOUNTER — Other Ambulatory Visit: Payer: Self-pay

## 2019-07-04 ENCOUNTER — Other Ambulatory Visit: Payer: Self-pay | Admitting: Cardiovascular Disease

## 2019-07-04 NOTE — Progress Notes (Signed)
Subjective:   Andrea [name] is a 74 y.o. female who presents for Medicare Annual (Subsequent) preventive examination.    This visit is being conducted through telemedicine due to the COVID-19 pandemic. This patient has given me verbal consent via doximity to conduct this visit, patient states they are participating from their home address. Some vital signs may be absent or patient reported.    Patient identification: identified by name, DOB, and current address  Review of Systems:  N/A  Cardiac Risk Factors include: advanced age (>61men, >53 women);diabetes mellitus;dyslipidemia;obesity (BMI >30kg/m2);hypertension     Objective:     Vitals: There were no vitals taken for this visit.  There is no height or weight on file to calculate BMI. Unable to obtain vitals due to visit being conducted via telephonically.   Advanced Directives 07/09/2019 02/08/2019 12/19/2017 01/26/2017 01/18/2017 01/12/2017 12/31/2016  Does Patient Have a Medical Advance Directive? Yes No Yes No No No No  Type of Paramedic of [place];Living will - [place];Living will - - - -  Copy of [place] in Chart? No - copy requested - No - copy requested - - - -  Would patient like information on creating a medical advance directive? - No - Patient declined - No - Patient declined - No - Patient declined No - Patient declined    Tobacco Social History   Tobacco Use  Smoking Status Former Smoker  . Packs/day: 1.00  . Years: 30.00  . Pack years: 30.00  . Types: Cigarettes  . Quit date: 07/17/2012  . Years since quitting: 6.9  Smokeless Tobacco Never Used  Tobacco Comment         Counseling given: Not Answered Comment:     Clinical Intake:  Pre-visit preparation completed: Yes  Pain : No/denies pain     Nutritional Risks: None Diabetes: Yes  How often do you need to have someone help you when you read instructions, pamphlets, or other  written materials from your doctor or pharmacy?: 1 - Never   Diabetes:  Is the patient diabetic?  Yes  If diabetic, was a CBG obtained today?  No  Did the patient bring in their glucometer from home?  No  How often do you monitor your CBG's? Once a day in the morning.   Financial Strains and Diabetes Management:  Are you having any financial strains with the device, your supplies or your medication? No .  Does the patient want to be seen by Chronic Care Management for management of their diabetes?  No  Would the patient like to be referred to a Nutritionist or for Diabetic Management?  No   Diabetic Exams:  Diabetic Eye Exam: Completed 09/07/18. Repeat yearly.  Diabetic Foot Exam: Completed 12/28/18. Repeat yearly.   Interpreter Needed?: No  Information entered by :: [facility], LPN  Past Medical History:  Diagnosis Date  . Bronchitis   . Chickenpox   . Coronary artery disease   . Depression   . Diabetes mellitus type 2, uncomplicated ([place])   . Diabetes mellitus without complication ([place])   . Diabetic retinopathy ([place])   . Dupuytren contracture   . Frequent urinary tract infections   . GERD (gastroesophageal reflux disease)   . History of hand surgery 2011   left hand  . Hyperlipidemia, unspecified   . Irritable bowel syndrome   . MI (myocardial infarction) ([place]) 1994  . Neuromuscular disorder ([place])    nerve pain in back  and legs  . Osteoporosis   . Pneumonia 2014  . Stroke ([place])   . TIA (transient ischemic attack)   . Vitamin D deficiency, unspecified   . Wheezing    Past Surgical History:  Procedure Laterality Date  . CARDIAC CATHETERIZATION  1994  . COLONOSCOPY     x3  . COLONOSCOPY WITH PROPOFOL N/A 02/08/2019   Procedure: COLONOSCOPY WITH PROPOFOL;  Surgeon: [name], MD;  Location: [facility] ENDOSCOPY;  Service: Gastroenterology;  Laterality: N/A;  . CORONARY ANGIOPLASTY  1994  . EYE SURGERY     cataracts  . HAND SURGERY Left 2011   left hand release of  contractures  . HIP FRACTURE SURGERY  11/2013  . PR COLSC FLX W/REMOVAL LESION BY HOT BX FORCEPS   08/21/2015   Procedure: COLONOSCOPY, FLEXIBLE, PROXIMAL TO SPLENIC FLEXURE; W/REMOVAL TUMOR/POLYP/OTHER LESION, HOT BX FORCEP/CAUTE; Surgeon: Carlena Hurl, MD; Location: [facility]; Service: General Surgery   Family History  Problem Relation Age of Onset  . Depression Daughter   . Arthritis Daughter        spine  . Plantar fasciitis Daughter   . Anxiety disorder Daughter   . Hyperlipidemia Daughter   . AAA (abdominal aortic aneurysm) Daughter   . Hypertension Mother   . Stroke Mother   . Cataracts Mother   . Depression Mother   . Heart disease Mother   . Asthma Brother   . Diabetes Daughter   . Hyperlipidemia Daughter   . Hypertension Daughter    Social History   Socioeconomic History  . Marital status: Married    Spouse name: Roselie Awkward  . Number of children: 3  . Years of education: 8  . Highest education level: 8th grade  Occupational History  . Occupation: retired  Tobacco Use  . Smoking status: Former Smoker    Packs/day: 1.00    Years: 30.00    Pack years: 30.00    Types: Cigarettes    Quit date: 07/17/2012    Years since quitting: 6.9  . Smokeless tobacco: Never Used  . Tobacco comment:    Substance and Sexual Activity  . Alcohol use: Not Currently    Alcohol/week: 0.0 standard drinks  . Drug use: No  . Sexual activity: Not on file  Other Topics Concern  . Not on file  Social History Narrative  . Not on file   Social Determinants of Health   Financial Resource Strain: Low Risk   . Difficulty of Paying Living Expenses: Not hard at all  Food Insecurity: No Food Insecurity  . Worried About Charity fundraiser in the Last Year: Never true  . Ran Out of Food in the Last Year: Never true  Transportation Needs: No Transportation Needs  . Lack of Transportation (Medical): No  . Lack of Transportation (Non-Medical): No  Physical Activity: Inactive    . Days of Exercise per Week: 0 days  . Minutes of Exercise per Session: 0 min  Stress: No Stress Concern Present  . Feeling of Stress : Not at all  Social Connections: Somewhat Isolated  . Frequency of Communication with Friends and Family: More than three times a week  . Frequency of Social Gatherings with Friends and Family: Once a week  . Attends Religious Services: Never  . Active Member of Clubs or Organizations: No  . Attends Archivist Meetings: Never  . Marital Status: Married    Outpatient Encounter Medications as of 07/09/2019  Medication Sig  . albuterol (VENTOLIN HFA)  108 (90 Base) MCG/ACT inhaler Inhale 2 puffs into the lungs every 6 (six) hours as needed for wheezing or shortness of breath.  . Alcohol Swabs (ALCOHOL PREP) PADS Use twice daily prior to SQ injection of insulin to clean skin  . aspirin EC 81 MG tablet Take 81 mg daily by mouth.  [name] Kitchen atorvastatin (LIPITOR) 80 MG tablet TAKE 1 TABLET BY MOUTH EVERY DAY  . Calcium Carb-Cholecalciferol ([employer] CALCIUM 600+D) 600-800 MG-UNIT TABS Take 1 tablet by mouth once daily  . Cyanocobalamin (B-12) 1000 MCG CAPS Take 1,000 mcg by mouth daily.   [name] Kitchen HYDROcodone-acetaminophen (NORCO) 10-325 MG tablet Take 1 tablet by mouth every 8 (eight) hours as needed.  . insulin glargine (LANTUS) 100 UNIT/ML injection Inject 0.65 mLs (65 Units total) into the skin daily with supper.  . Insulin Syringe-Needle U-100 29G 1 ML MISC Use to inject 65 units Lantus SQ BIDWC  . losartan (COZAAR) 25 MG tablet TAKE 1 TABLET BY MOUTH EVERY DAY  . metoprolol succinate (TOPROL-XL) 25 MG 24 hr tablet Take 1 tablet (25 mg total) by mouth daily.  . Multiple Vitamins-Minerals (ICAPS AREDS 2 PO) Take 1 capsule by mouth 2 (two) times daily.   . nitrofurantoin (MACRODANTIN) 100 MG capsule Take 100 mg at bedtime by mouth.  . nystatin (MYCOSTATIN/NYSTOP) powder Apply 1 application topically 3 (three) times daily.  . Omega-3 Fatty Acids (FISH OIL) 1200 MG CAPS  Take 1 capsule (1,200 mg total) by mouth daily.  [name] Kitchen omeprazole (PRILOSEC) 40 MG capsule Take 1 capsule (40 mg total) by mouth daily.  Glory Rosebush Delica Lancets 99991111 MISC To check blood sugar daily  DX: E11.9  . ONETOUCH VERIO test strip TO CHECK BLOOD SUGAR ONCE DAILY.  [name] Kitchen oxybutynin (DITROPAN-XL) 10 MG 24 hr tablet TAKE 1 TABLET BY MOUTH EVERYDAY AT BEDTIME  . sertraline (ZOLOFT) 25 MG tablet Take 1 tablet (25 mg) by mouth once daily as needed  . TRULICITY 1.5 0000000 SOPN INJECT 1.5MG  INTO THE SKIN ONCE A WEEK AS DIRECTED  . Vitamin D, Ergocalciferol, (DRISDOL) 1.25 MG (50000 UT) CAPS capsule TAKE ONE CAPSULE BY MOUTH ONCE WEEKLY ON THE SAME DAY EACH WEEK (ON FRIDAYS)   No facility-administered encounter medications on file as of 07/09/2019.    Activities of Daily Living In your present state of health, do you have any difficulty performing the following activities: 07/09/2019 12/28/2018  Hearing? N N  Vision? N N  Difficulty concentrating or making decisions? N Y  Walking or climbing stairs? Y Y  Comment Due to being wheelchair bound. -  Dressing or bathing? Y N  Comment Husband assists with showers. -  Doing errands, shopping? N Y  Conservation officer, nature and eating ? N -  Using the Toilet? N -  In the past six months, have you accidently leaked urine? N -  Do you have problems with loss of bowel control? N -  Managing your Medications? N -  Managing your Finances? N -  Housekeeping or managing your Housekeeping? N -  Some recent data might be hidden    Patient Care Team: Mar Daring, PA-C as PCP - General (Family Medicine) Minna Merritts, MD as PCP - Cardiology (Cardiology) Dingeldein, Remo Lipps, MD as Consulting Physician (Ophthalmology) Newman Pies, MD as Consulting Physician (Neurosurgery) Kris Hartmann, NP as Nurse Practitioner (Vascular Surgery) Algernon Huxley, MD as Referring Physician (Vascular Surgery) [name], MD as Consulting Physician (Gastroenterology)      Assessment:   This is a  routine wellness examination for Andrea [name].  Exercise Activities and Dietary recommendations Current Exercise Habits: The patient does not participate in regular exercise at present, Exercise limited by: orthopedic condition(s)  Goals    . Decrease soda or juice intake     Recommend decreasing caffeine by 2 glasses a dayand drinking 3 glasses of water a day as a subsitute.    . Have 3 meals a day     Recommend to decrease portion sizes by eating 3 small healthy meals and at least 2 healthy snacks per day.         Fall Risk: Fall Risk  07/09/2019 12/28/2018 12/19/2017 12/16/2016 11/11/2015  Falls in the past year? 0 0 No No No  Number falls in past yr: 0 0 - - -  Injury with Fall? 0 0 - - -  Risk for fall due to : - Impaired balance/gait;Impaired mobility - - -  Follow up - Falls evaluation completed - - -    FALL RISK PREVENTION PERTAINING TO THE HOME:  Any stairs in or around the home? No  If so, are there any without handrails? N/A  Home free of loose throw rugs in walkways, pet beds, electrical cords, etc? Yes  Adequate lighting in your home to reduce risk of falls? Yes   ASSISTIVE DEVICES UTILIZED TO PREVENT FALLS:  Life alert? No  Use of a cane, walker or w/c? Yes  Grab bars in the bathroom? No  Shower chair or bench in shower? Yes  Elevated toilet seat or a handicapped toilet? Yes   TIMED UP AND GO:  Was the test performed? No .    Depression Screen PHQ 2/9 Scores 07/09/2019 12/28/2018 12/19/2017 12/16/2016  PHQ - 2 Score 0 0 0 0  PHQ- 9 Score - 0 - 0     Cognitive Function: Not due until 12/2019.     6CIT Screen 12/28/2018 12/19/2017 12/16/2016  What Year? 0 points 0 points 0 points  What month? 0 points 0 points 0 points  What time? 0 points 0 points 0 points  Count back from 20 0 points 0 points 0 points  Months in reverse 0 points 0 points 2 points  Repeat phrase 2 points 4 points 4 points  Total Score 2 4 6      Immunization History  Administered Date(s) Administered  . Influenza, High Dose Seasonal PF 02/12/2016, 12/16/2016, 12/19/2017  . Zoster Recombinat (Shingrix) 05/07/2018, 11/21/2018    Qualifies for Shingles Vaccine? Completed series  Tdap: Although this vaccine is not a covered service during a Wellness Exam, does the patient still wish to receive this vaccine today?  No . Advised may receive this vaccine at local pharmacy or Health Dept. Aware to provide a copy of the vaccination record if obtained from local pharmacy or Health Dept. Verbalized acceptance and understanding.  Flu Vaccine: Due fall 2021  Pneumococcal Vaccine: Completed series  Screening Tests Health Maintenance  Topic Date Due  . COVID-19 Vaccine (1) Never done  . MAMMOGRAM  04/13/2019  . TETANUS/TDAP  12/28/2019 (Originally 10/02/1964)  . OPHTHALMOLOGY EXAM  09/07/2019  . HEMOGLOBIN A1C  10/04/2019  . INFLUENZA VACCINE  10/07/2019  . FOOT EXAM  12/28/2019  . DEXA SCAN  03/07/2020  . COLONOSCOPY  02/07/2022  . Hepatitis C Screening  Completed  . PNA vac Low Risk Adult  Completed    Cancer Screenings:  Colorectal Screening: Completed 02/08/19. Repeat every 3 years.   Mammogram: Completed 04/12/17. Repeat every 1-2 years as  advised. Ordered 12/28/18 by PCP. Pt provided with contact info and advised to call to schedule appt.   Bone Density: Completed 03/07/18. Results reflect OSTEOPOROSIS. Repeat every 2 years.   Lung Cancer Screening: (Low Dose CT Chest recommended if Age 81-80 years, 30 pack-year currently smoking OR have quit w/in 15years.) Pt does qualify, however declines today.   Additional Screening:  Hepatitis C Screening: Up to date  Dental Screening: Recommended annual dental exams for proper oral hygiene   Community Resource Referral:  CRR required this visit?  No       Plan:  I have personally reviewed and addressed the Medicare Annual Wellness questionnaire and have noted the following  in the patient's chart:  A. Medical and social history B. Use of alcohol, tobacco or illicit drugs  C. Current medications and supplements D. Functional ability and status E.  Nutritional status F.  Physical activity G. Advance directives H. List of other physicians I.  Hospitalizations, surgeries, and ER visits in previous 12 months J.  [place] such as hearing and vision if needed, cognitive and depression L. Referrals and appointments   In addition, I have reviewed and discussed with patient certain preventive protocols, quality metrics, and best practice recommendations. A written personalized care plan for preventive services as well as general preventive health recommendations were provided to patient.   Glendora Score, [place]  [id number] Nurse Health Advisor   Nurse Notes: Pt to call [facility] and set up mammogram. Contact information given.

## 2019-07-05 ENCOUNTER — Other Ambulatory Visit: Payer: Self-pay

## 2019-07-05 ENCOUNTER — Encounter: Payer: Self-pay | Admitting: Physician Assistant

## 2019-07-05 ENCOUNTER — Ambulatory Visit (INDEPENDENT_AMBULATORY_CARE_PROVIDER_SITE_OTHER): Payer: Medicare Other | Admitting: Physician Assistant

## 2019-07-05 VITALS — BP 124/68 | HR 93 | Temp 96.9°F | Resp 16 | Wt 159.0 lb

## 2019-07-05 DIAGNOSIS — L304 Erythema intertrigo: Secondary | ICD-10-CM

## 2019-07-05 DIAGNOSIS — Z794 Long term (current) use of insulin: Secondary | ICD-10-CM

## 2019-07-05 DIAGNOSIS — E1151 Type 2 diabetes mellitus with diabetic peripheral angiopathy without gangrene: Secondary | ICD-10-CM | POA: Diagnosis not present

## 2019-07-05 LAB — POCT GLYCOSYLATED HEMOGLOBIN (HGB A1C)
Est. average glucose Bld gHb Est-mCnc: 186
Hemoglobin A1C: 8.1 % — AB (ref 4.0–5.6)

## 2019-07-05 MED ORDER — NYSTATIN 100000 UNIT/GM EX POWD: 1.0000 "application " | Freq: Three times a day (TID) | CUTANEOUS | 0 refills | Status: DC

## 2019-07-05 NOTE — Patient Instructions (Signed)
Intertrigo Intertrigo is skin irritation or inflammation (dermatitis) that occurs when folds of skin rub together. The irritation can cause a rash and make skin raw and itchy. This condition most commonly occurs in the skin folds of these areas:  Toes.  Armpits.  Groin.  Under the belly.  Under the breasts.  Buttocks. Intertrigo is not passed from person to person (is not contagious). What are the causes? This condition is caused by heat, moisture, rubbing (friction), and not enough air circulation. The condition can be made worse by:  Sweat.  Bacteria.  A fungus, such as yeast. What increases the risk? This condition is more likely to occur if you have moisture in your skin folds. You are more likely to develop this condition if you:  Have diabetes.  Are overweight.  Are not able to move around or are not active.  Live in a warm and moist climate.  Wear splints, braces, or other medical devices.  Are not able to control your bowels or bladder (have incontinence). What are the signs or symptoms? Symptoms of this condition include:  A pink or red skin rash in the skin fold or near the skin fold.  Raw or scaly skin.  Itchiness.  A burning feeling.  Bleeding.  Leaking fluid.  A bad smell. How is this diagnosed? This condition is diagnosed with a medical history and physical exam. You may also have a skin swab to test for bacteria or a fungus. How is this treated? This condition may be treated by:  Cleaning and drying your skin.  Taking an antibiotic medicine or using an antibiotic skin cream for a bacterial infection.  Using an antifungal cream on your skin or taking pills for an infection that was caused by a fungus, such as yeast.  Using a steroid ointment to relieve itchiness and irritation.  Separating the skin fold with a clean cotton cloth to absorb moisture and allow air to flow into the area. Follow these instructions at home:  Keep the  affected area clean and dry.  Do not scratch your skin.  Stay in a cool environment as much as possible. Use an air conditioner or fan, if available.  Apply over-the-counter and prescription medicines only as told by your health care provider.  If you were prescribed an antibiotic medicine, use it as told by your health care provider. Do not stop using the antibiotic even if your condition improves.  Keep all follow-up visits as told by your health care provider. This is important. How is this prevented?   Maintain a healthy weight.  Take care of your feet, especially if you have diabetes. Foot care includes: ? Wearing shoes that fit well. ? Keeping your feet dry. ? Wearing clean, breathable socks.  Protect the skin around your groin and buttocks, especially if you have incontinence. Skin protection includes: ? Following a regular cleaning routine. ? Using skin protectant creams, powders, or ointments. ? Changing protection pads frequently.  Do not wear tight clothes. Wear clothes that are loose, absorbent, and made of cotton.  Wear a bra that gives good support, if needed.  Shower and dry yourself well after activity or exercise. Use a hair dryer on a cool setting to dry between skin folds, especially after you bathe.  If you have diabetes, keep your blood sugar under control. Contact a health care provider if:  Your symptoms do not improve with treatment.  Your symptoms get worse or they spread.  You notice increased redness and   warmth.  You have a fever. Summary  Intertrigo is skin irritation or inflammation (dermatitis) that occurs when folds of skin rub together.  This condition is caused by heat, moisture, rubbing (friction), and not enough air circulation.  This condition may be treated by cleaning and drying your skin and with medicines.  Apply over-the-counter and prescription medicines only as told by your health care provider.  Keep all follow-up visits  as told by your health care provider. This is important. This information is not intended to replace advice given to you by your health care provider. Make sure you discuss any questions you have with your health care provider. Document Revised: 07/25/2017 Document Reviewed: 07/25/2017 Elsevier Patient Education  2020 [employer]  

## 2019-07-09 ENCOUNTER — Other Ambulatory Visit: Payer: Self-pay

## 2019-07-09 ENCOUNTER — Ambulatory Visit (INDEPENDENT_AMBULATORY_CARE_PROVIDER_SITE_OTHER): Payer: Medicare Other

## 2019-07-09 DIAGNOSIS — Z0000 Encounter for general adult medical examination without abnormal findings: Secondary | ICD-10-CM

## 2019-07-09 NOTE — Patient Instructions (Addendum)
Andrea [name] , Thank you for taking time to come for your Medicare Wellness Visit. I appreciate your ongoing commitment to your health goals. Please review the following plan we discussed and let me know if I can assist you in the future.   Screening recommendations/referrals: Colonoscopy: Up to date, due 02/2022 Mammogram: Currently due- ordered 12/28/18. Pt to call [facility] and set up apt.  Bone Density: Up to date, due 02/2020 Recommended yearly ophthalmology/optometry visit for glaucoma screening and checkup Recommended yearly dental visit for hygiene and checkup  Vaccinations: Influenza vaccine: Due fall 2021 Pneumococcal vaccine: Completed series Tdap vaccine: Pt declines today.  Shingles vaccine: Completed series    Advanced directives: Please bring a copy of your POA (Power of Attorney) and/or Living Will to your next appointment.   Conditions/risks identified: Recommend to increase water intake to 6-8 oz glasses a day. Continue to eat a healthy diet and avoid high sugar and high carbohydrate foods.   Next appointment: 10/11/19 @ 10:20 AM with [name]. Declined scheduling an AWV for 2022 at this time.    Preventive Care 35 Years and Older, Female Preventive care refers to lifestyle choices and visits with your health care provider that can promote health and wellness. What does preventive care include?  A yearly physical exam. This is also called an annual well check.  Dental exams once or twice a year.  Routine eye exams. Ask your health care provider how often you should have your eyes checked.  Personal lifestyle choices, including:  Daily care of your teeth and gums.  Regular physical activity.  Eating a healthy diet.  Avoiding tobacco and drug use.  Limiting alcohol use.  Practicing safe sex.  Taking low-dose aspirin every day.  Taking vitamin and mineral supplements as recommended by your health care provider. What happens during an  annual well check? The services and screenings done by your health care provider during your annual well check will depend on your age, overall health, lifestyle risk factors, and family history of disease. Counseling  Your health care provider may ask you questions about your:  Alcohol use.  Tobacco use.  Drug use.  Emotional well-being.  Home and relationship well-being.  Sexual activity.  Eating habits.  History of falls.  Memory and ability to understand (cognition).  Work and work Statistician.  Reproductive health. Screening  You may have the following tests or measurements:  Height, weight, and BMI.  Blood pressure.  Lipid and cholesterol levels. These may be checked every 5 years, or more frequently if you are over 26 years old.  Skin check.  Lung cancer screening. You may have this screening every year starting at age 71 if you have a 30-pack-year history of smoking and currently smoke or have quit within the past 15 years.  Fecal occult blood test (FOBT) of the stool. You may have this test every year starting at age 45.  Flexible sigmoidoscopy or colonoscopy. You may have a sigmoidoscopy every 5 years or a colonoscopy every 10 years starting at age 31.  Hepatitis C blood test.  Hepatitis B blood test.  Sexually transmitted disease (STD) testing.  Diabetes screening. This is done by checking your blood sugar (glucose) after you have not eaten for a while (fasting). You may have this done every 1-3 years.  Bone density scan. This is done to screen for osteoporosis. You may have this done starting at age 27.  Mammogram. This may be done every 1-2 years. Talk to  your health care provider about how often you should have regular mammograms. Talk with your health care provider about your test results, treatment options, and if necessary, the need for more tests. Vaccines  Your health care provider may recommend certain vaccines, such as:  Influenza  vaccine. This is recommended every year.  Tetanus, diphtheria, and acellular pertussis (Tdap, Td) vaccine. You may need a Td booster every 10 years.  Zoster vaccine. You may need this after age 45.  Pneumococcal 13-valent conjugate (PCV13) vaccine. One dose is recommended after age 55.  Pneumococcal polysaccharide (PPSV23) vaccine. One dose is recommended after age 22. Talk to your health care provider about which screenings and vaccines you need and how often you need them. This information is not intended to replace advice given to you by your health care provider. Make sure you discuss any questions you have with your health care provider. Document Released: 03/21/2015 Document Revised: 11/12/2015 Document Reviewed: 12/24/2014 Elsevier Interactive Patient Education  2017 [place] Prevention in the Home Falls can cause injuries. They can happen to people of all ages. There are many things you can do to make your home safe and to help prevent falls. What can I do on the outside of my home?  Regularly fix the edges of walkways and driveways and fix any cracks.  Remove anything that might make you trip as you walk through a door, such as a raised step or threshold.  Trim any bushes or trees on the path to your home.  Use bright outdoor lighting.  Clear any walking paths of anything that might make someone trip, such as rocks or tools.  Regularly check to see if handrails are loose or broken. Make sure that both sides of any steps have handrails.  Any raised decks and porches should have guardrails on the edges.  Have any leaves, snow, or ice cleared regularly.  Use sand or salt on walking paths during winter.  Clean up any spills in your garage right away. This includes oil or grease spills. What can I do in the bathroom?  Use night lights.  Install grab bars by the toilet and in the tub and shower. Do not use towel bars as grab bars.  Use non-skid mats or decals  in the tub or shower.  If you need to sit down in the shower, use a plastic, non-slip stool.  Keep the floor dry. Clean up any water that spills on the floor as soon as it happens.  Remove soap buildup in the tub or shower regularly.  Attach bath mats securely with double-sided non-slip rug tape.  Do not have throw rugs and other things on the floor that can make you trip. What can I do in the bedroom?  Use night lights.  Make sure that you have a light by your bed that is easy to reach.  Do not use any sheets or blankets that are too big for your bed. They should not hang down onto the floor.  Have a firm chair that has side arms. You can use this for support while you get dressed.  Do not have throw rugs and other things on the floor that can make you trip. What can I do in the kitchen?  Clean up any spills right away.  Avoid walking on wet floors.  Keep items that you use a lot in easy-to-reach places.  If you need to reach something above you, use a strong step stool that has  a grab bar.  Keep electrical cords out of the way.  Do not use floor polish or wax that makes floors slippery. If you must use wax, use non-skid floor wax.  Do not have throw rugs and other things on the floor that can make you trip. What can I do with my stairs?  Do not leave any items on the stairs.  Make sure that there are handrails on both sides of the stairs and use them. Fix handrails that are broken or loose. Make sure that handrails are as long as the stairways.  Check any carpeting to make sure that it is firmly attached to the stairs. Fix any carpet that is loose or worn.  Avoid having throw rugs at the top or bottom of the stairs. If you do have throw rugs, attach them to the floor with carpet tape.  Make sure that you have a light switch at the top of the stairs and the bottom of the stairs. If you do not have them, ask someone to add them for you. What else can I do to help  prevent falls?  Wear shoes that:  Do not have high heels.  Have rubber bottoms.  Are comfortable and fit you well.  Are closed at the toe. Do not wear sandals.  If you use a stepladder:  Make sure that it is fully opened. Do not climb a closed stepladder.  Make sure that both sides of the stepladder are locked into place.  Ask someone to hold it for you, if possible.  Clearly mark and make sure that you can see:  Any grab bars or handrails.  First and last steps.  Where the edge of each step is.  Use tools that help you move around (mobility aids) if they are needed. These include:  Canes.  Walkers.  Scooters.  Crutches.  Turn on the lights when you go into a dark area. Replace any light bulbs as soon as they burn out.  Set up your furniture so you have a clear path. Avoid moving your furniture around.  If any of your floors are uneven, fix them.  If there are any pets around you, be aware of where they are.  Review your medicines with your doctor. Some medicines can make you feel dizzy. This can increase your chance of falling. Ask your doctor what other things that you can do to help prevent falls. This information is not intended to replace advice given to you by your health care provider. Make sure you discuss any questions you have with your health care provider. Document Released: 12/19/2008 Document Revised: 07/31/2015 Document Reviewed: 03/29/2014 Elsevier Interactive Patient Education  2017 [employer].

## 2019-07-14 ENCOUNTER — Ambulatory Visit: Payer: Medicare Other | Attending: Internal Medicine

## 2019-07-14 DIAGNOSIS — Z23 Encounter for immunization: Secondary | ICD-10-CM

## 2019-07-14 NOTE — Progress Notes (Signed)
   Covid-19 Vaccination Clinic  Name:  Andrea [name]    MRN: [id number] DOB: [birth year]  07/14/2019  Ms. [name] was observed post Covid-19 immunization for 15 minutes without incident. She was provided with Vaccine Information Sheet and instruction to access the V-Safe system.   Ms. [name] was instructed to call 911 with any severe reactions post vaccine: [name] Kitchen Difficulty breathing  . Swelling of face and throat  . A fast heartbeat  . A bad rash all over body  . Dizziness and weakness   Immunizations Administered    Name Date Dose VIS Date Route   Pfizer COVID-19 Vaccine 07/14/2019  9:45 AM 0.3 mL 05/02/2018 Intramuscular   Manufacturer: [employer], [employer]   Lot: [id number]   [place]: [id number]

## 2019-07-16 ENCOUNTER — Other Ambulatory Visit: Payer: Self-pay | Admitting: Physician Assistant

## 2019-07-16 DIAGNOSIS — M4316 Spondylolisthesis, lumbar region: Secondary | ICD-10-CM

## 2019-07-16 MED ORDER — HYDROCODONE-ACETAMINOPHEN 10-325 MG PO TABS: 1.0000 | ORAL_TABLET | Freq: Three times a day (TID) | ORAL | 0 refills | Status: DC | PRN

## 2019-07-16 NOTE — Telephone Encounter (Signed)
Medication: HYDROcodone-acetaminophen (NORCO) 10-325 MG tablet [id number]   Has the patient contacted their pharmacy? Yes  (Agent: If no, request that the patient contact the pharmacy for the refill.) (Agent: If yes, when and what did the pharmacy advise?)  Preferred Pharmacy (with phone number [facility] street name[id number] [place], [place]  Phone:  [phone] Fax:  [phone]     Agent: Please be advised that RX refills may take up to 3 business days. We ask that you follow-up with your pharmacy.

## 2019-07-17 ENCOUNTER — Other Ambulatory Visit: Payer: Self-pay | Admitting: Physician Assistant

## 2019-07-17 DIAGNOSIS — E1142 Type 2 diabetes mellitus with diabetic polyneuropathy: Secondary | ICD-10-CM

## 2019-07-17 DIAGNOSIS — Z794 Long term (current) use of insulin: Secondary | ICD-10-CM

## 2019-07-28 ENCOUNTER — Other Ambulatory Visit: Payer: Self-pay | Admitting: Physician Assistant

## 2019-07-28 DIAGNOSIS — E113299 Type 2 diabetes mellitus with mild nonproliferative diabetic retinopathy without macular edema, unspecified eye: Secondary | ICD-10-CM

## 2019-07-31 ENCOUNTER — Other Ambulatory Visit: Payer: Self-pay | Admitting: Physician Assistant

## 2019-07-31 DIAGNOSIS — K219 Gastro-esophageal reflux disease without esophagitis: Secondary | ICD-10-CM

## 2019-08-05 ENCOUNTER — Other Ambulatory Visit: Payer: Self-pay | Admitting: Physician Assistant

## 2019-08-05 DIAGNOSIS — E559 Vitamin D deficiency, unspecified: Secondary | ICD-10-CM

## 2019-08-05 NOTE — Telephone Encounter (Signed)
Requested medications are due for refill today?  Yes  - 50,000 IU strengths are not delegated.    Requested medications are on active medication list?  Yes  Last Refill:  02/23/2019  # 12 with one refill   Future visit scheduled? Yes   Notes to Clinic:  This strength cannot be delegated.

## 2019-08-14 ENCOUNTER — Ambulatory Visit: Payer: Medicare Other | Attending: Internal Medicine

## 2019-08-14 DIAGNOSIS — Z23 Encounter for immunization: Secondary | ICD-10-CM

## 2019-08-14 NOTE — Progress Notes (Signed)
   Covid-19 Vaccination Clinic  Name:  MARDEE CLUNE    MRN: [id number] DOB: 1946-01-09  08/14/2019  Ms. Maxim was observed post Covid-19 immunization for 15 minutes without incident. She was provided with Vaccine Information Sheet and instruction to access the V-Safe system.   Ms. Vanzile was instructed to call 911 with any severe reactions post vaccine: [name] Kitchen Difficulty breathing  . Swelling of face and throat  . A fast heartbeat  . A bad rash all over body  . Dizziness and weakness   Immunizations Administered    Name Date Dose VIS Date Route   Pfizer COVID-19 Vaccine 08/14/2019 10:22 AM 0.3 mL 05/02/2018 Intramuscular   Manufacturer: [place]   Lot: [id number]   [place]: [id number]

## 2019-08-16 ENCOUNTER — Other Ambulatory Visit: Payer: Self-pay | Admitting: Physician Assistant

## 2019-08-16 DIAGNOSIS — M4316 Spondylolisthesis, lumbar region: Secondary | ICD-10-CM

## 2019-08-16 MED ORDER — HYDROCODONE-ACETAMINOPHEN 10-325 MG PO TABS: 1.0000 | ORAL_TABLET | Freq: Three times a day (TID) | ORAL | 0 refills | Status: DC | PRN

## 2019-08-16 NOTE — Telephone Encounter (Signed)
Requested medication (s) are due for refill today: yes  Requested medication (s) are on the active medication list: yes  Last refill:  07/16/2019  Future visit scheduled: yes  Notes to clinic:  this refill cannot be delegated    Requested Prescriptions  Pending Prescriptions Disp Refills   HYDROcodone-acetaminophen (NORCO) 10-325 MG tablet 90 tablet 0    Sig: Take 1 tablet by mouth every 8 (eight) hours as needed.      Not Delegated - Analgesics:  Opioid Agonist Combinations Failed - 08/16/2019  8:09 AM      Failed - This refill cannot be delegated      Failed - Urine Drug Screen completed in last 360 days.      Passed - Valid encounter within last 6 months    Recent Outpatient Visits           1 month ago Type 2 diabetes mellitus with diabetic peripheral angiopathy without gangrene, with long-term current use of insulin [facility])   [facility] [place], [place], PA-C   4 months ago Type 2 diabetes mellitus with diabetic peripheral angiopathy without gangrene, with long-term current use of insulin [facility])   [facility] [place], Anderson Malta M, [place]   7 months ago Annual physical exam   [facility] [name] M, [place]   11 months ago 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, with long-term current use of insulin, macular edema presence unspecified, unspecified laterality [facility])   [place], [place], PA-C   1 year ago 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

## 2019-08-16 NOTE — Telephone Encounter (Signed)
Pt request refill  HYDROcodone-acetaminophen (NORCO) 10-325 MG tablet  90 tabs [id number] [place], [place] Phone:  [phone]  Fax:  [phone]

## 2019-08-20 ENCOUNTER — Telehealth: Payer: Self-pay | Admitting: Physician Assistant

## 2019-08-20 NOTE — Telephone Encounter (Signed)
Order written to be faxed

## 2019-08-20 NOTE — Telephone Encounter (Signed)
faxed

## 2019-08-20 NOTE — Telephone Encounter (Signed)
Pt has a walker with wheels that has broken and Pt called [facility] and was advised to call her PCP and place an order for a new walker with wheels / [facility] or [facility] were two places she was told the orders could be placed with/ please advise Pt when ordered if they need to be picked up or if they can be delivered to her home

## 2019-08-21 ENCOUNTER — Telehealth: Payer: Self-pay

## 2019-08-21 NOTE — Telephone Encounter (Signed)
Copied from [place] [phone]. Topic: General - Other >> Aug 21, 2019  8:54 AM Oneta Rack wrote: Osvaldo Human name: Cecille Rubin from [facility]  Call back number: [phone]    Reason for call:  Faxing orders back to PCP regarding walker. Unable to process order stating patient received a walker in 2018 therefore the order needs to go to the company who provided patient with walker in 2018.

## 2019-08-23 ENCOUNTER — Telehealth: Payer: Self-pay

## 2019-08-23 NOTE — Telephone Encounter (Signed)
Noted  

## 2019-08-23 NOTE — Telephone Encounter (Signed)
Copied from [place] [phone]. Topic: General - Other >> Aug 23, 2019  3:34 PM Yvette Rack wrote: Reason for CRM: Pt requested that a message be sent to [name] advising that she does not need a walker because her neighbor gave her one.

## 2019-08-23 NOTE — Telephone Encounter (Signed)
LMTCB if patient calls ok for [facility] nurse to give message

## 2019-08-23 NOTE — Telephone Encounter (Signed)
Can we call her and see if she knows which company she received her walker from in 2018 please?  This is who we would need to use.

## 2019-08-24 ENCOUNTER — Encounter: Payer: Self-pay | Admitting: *Deleted

## 2019-08-24 NOTE — Telephone Encounter (Signed)
Contacted pt regarding walker obtained in 2018; the pt states she got her that walker from a yard sale; she also says that a friend gave her a walker this time, so she does not need one; the pt states she left a message for the office yesterday to notify them; she is seen by [name], [facility]; will route to office for notification.

## 2019-08-24 NOTE — Telephone Encounter (Signed)
This encounter was created in error - please disregard.

## 2019-08-24 NOTE — Telephone Encounter (Signed)
Please see other note regarding this.

## 2019-08-24 NOTE — Telephone Encounter (Signed)
noted 

## 2019-09-12 DIAGNOSIS — Z961 Presence of intraocular lens: Secondary | ICD-10-CM | POA: Diagnosis not present

## 2019-09-12 DIAGNOSIS — H353131 Nonexudative age-related macular degeneration, bilateral, early dry stage: Secondary | ICD-10-CM | POA: Diagnosis not present

## 2019-09-12 LAB — HM DIABETES EYE EXAM

## 2019-09-13 ENCOUNTER — Other Ambulatory Visit: Payer: Self-pay | Admitting: Physician Assistant

## 2019-09-13 DIAGNOSIS — M4316 Spondylolisthesis, lumbar region: Secondary | ICD-10-CM

## 2019-09-13 MED ORDER — HYDROCODONE-ACETAMINOPHEN 10-325 MG PO TABS: 1.0000 | ORAL_TABLET | Freq: Three times a day (TID) | ORAL | 0 refills | Status: DC | PRN

## 2019-09-13 NOTE — Telephone Encounter (Signed)
HYDROcodone-acetaminophen (NORCO) 10-325 MG tablet Medication Date: 08/16/2019 Department: [place] Ordering/Authorizing: Mar Daring, PA-C   [id number] [place], [place] Phone:  [phone]  Fax:  [phone]     PT knows this is a little early but in re to another FU call wanted to request at earliest refill date.

## 2019-09-14 ENCOUNTER — Encounter: Payer: Self-pay | Admitting: Physician Assistant

## 2019-09-27 ENCOUNTER — Other Ambulatory Visit: Payer: Self-pay | Admitting: Cardiovascular Disease

## 2019-10-05 DIAGNOSIS — Z794 Long term (current) use of insulin: Secondary | ICD-10-CM | POA: Diagnosis not present

## 2019-10-05 DIAGNOSIS — M25552 Pain in left hip: Secondary | ICD-10-CM | POA: Diagnosis not present

## 2019-10-05 DIAGNOSIS — T8484XA Pain due to internal orthopedic prosthetic devices, implants and grafts, initial encounter: Secondary | ICD-10-CM | POA: Diagnosis not present

## 2019-10-05 DIAGNOSIS — E119 Type 2 diabetes mellitus without complications: Secondary | ICD-10-CM | POA: Diagnosis not present

## 2019-10-05 DIAGNOSIS — M7062 Trochanteric bursitis, left hip: Secondary | ICD-10-CM | POA: Diagnosis not present

## 2019-10-08 ENCOUNTER — Telehealth: Payer: Self-pay

## 2019-10-08 ENCOUNTER — Telehealth: Payer: Self-pay | Admitting: Physician Assistant

## 2019-10-08 ENCOUNTER — Other Ambulatory Visit: Payer: Self-pay | Admitting: Orthopedic Surgery

## 2019-10-08 DIAGNOSIS — E113293 Type 2 diabetes mellitus with mild nonproliferative diabetic retinopathy without macular edema, bilateral: Secondary | ICD-10-CM

## 2019-10-08 DIAGNOSIS — Z794 Long term (current) use of insulin: Secondary | ICD-10-CM

## 2019-10-08 MED ORDER — "INSULIN SYRINGE 29G X 1"" 0.3 ML MISC": 6 refills | Status: DC

## 2019-10-08 NOTE — Telephone Encounter (Signed)
Requested medication (s) are due for refill today: Yes  Requested medication (s) are on the active medication list: Yes  Last refill:  2017  Future visit scheduled: Yes  Notes to clinic:  Prescription has expired.    There are no medications in this encounter.

## 2019-10-08 NOTE — Telephone Encounter (Signed)
Copied from [place] [phone]. Topic: General - Other >> Oct 08, 2019  8:21 AM [name] wrote: Reason for CRM: Per patient would like Andrea [name] to talk to her daughter about getting COVID vaccinated when she come in with her for her visit

## 2019-10-08 NOTE — Telephone Encounter (Signed)
Copied from [place] [phone]. Topic: Quick Communication - Rx Refill/Question >> Oct 08, 2019  8:17 AM [name] wrote: Medication: Insulin Syringe-Needle U-100 29G 1 ML MISC  Has the patient contacted their pharmacy? Yes.   (Agent: If no, request that the patient contact the pharmacy for the refill.) (Agent: If yes, when and what did the pharmacy advise?)  Preferred Pharmacy (with phone number [facility] street name[id number] [place], [place]  Phone:  [phone] Fax:  [phone]     Agent: Please be advised that RX refills may take up to 3 business days. We ask that you follow-up with your pharmacy.

## 2019-10-08 NOTE — Telephone Encounter (Signed)
noted 

## 2019-10-08 NOTE — Telephone Encounter (Signed)
Refilled insulin needles

## 2019-10-11 ENCOUNTER — Encounter: Payer: Self-pay | Admitting: Physician Assistant

## 2019-10-11 ENCOUNTER — Ambulatory Visit (INDEPENDENT_AMBULATORY_CARE_PROVIDER_SITE_OTHER): Payer: Medicare Other | Admitting: Physician Assistant

## 2019-10-11 ENCOUNTER — Other Ambulatory Visit: Payer: Self-pay

## 2019-10-11 VITALS — BP 122/69 | HR 96 | Temp 98.2°F | Resp 16 | Wt 159.6 lb

## 2019-10-11 DIAGNOSIS — I5022 Chronic systolic (congestive) heart failure: Secondary | ICD-10-CM | POA: Diagnosis not present

## 2019-10-11 DIAGNOSIS — Z794 Long term (current) use of insulin: Secondary | ICD-10-CM | POA: Diagnosis not present

## 2019-10-11 DIAGNOSIS — I25118 Atherosclerotic heart disease of native coronary artery with other forms of angina pectoris: Secondary | ICD-10-CM

## 2019-10-11 DIAGNOSIS — F3342 Major depressive disorder, recurrent, in full remission: Secondary | ICD-10-CM | POA: Diagnosis not present

## 2019-10-11 DIAGNOSIS — E113293 Type 2 diabetes mellitus with mild nonproliferative diabetic retinopathy without macular edema, bilateral: Secondary | ICD-10-CM

## 2019-10-11 DIAGNOSIS — J410 Simple chronic bronchitis: Secondary | ICD-10-CM | POA: Diagnosis not present

## 2019-10-11 LAB — POCT GLYCOSYLATED HEMOGLOBIN (HGB A1C): Hemoglobin A1C: 8.1 % — AB (ref 4.0–5.6)

## 2019-10-11 NOTE — Progress Notes (Signed)
Established patient visit   Patient: Andrea [name]   DOB: Jun 22, 1945   74 y.o. Female  MRN: [id number] Visit Date: 10/11/2019  Today's healthcare provider: Mar Daring, PA-C   Chief Complaint  Patient presents with  . Follow-up   Subjective    HPI  Diabetes Mellitus Type II, Follow-up  Lab Results  Component Value Date   HGBA1C 8.1 (A) 10/11/2019   HGBA1C 8.1 (A) 07/05/2019   HGBA1C 7.6 (A) 04/05/2019   Wt Readings from Last 3 Encounters:  10/11/19 159 lb 9.6 oz (72.4 kg)  07/05/19 159 lb (72.1 kg)  04/26/19 154 lb (69.9 kg)   Last seen for diabetes 3 months ago.  Management since then includes Continue current regimen of Lantus 65 units daily and Trulicity 1.5mg  once weekly. Will f/u in 3 months. If A1c increases again will increase trulicity to 3mg .  . She reports excellent compliance with treatment. She is not having side effects.   Symptoms: No fatigue No foot ulcerations  No appetite changes No nausea  No paresthesia of the feet  No polydipsia  No polyuria No visual disturbances   No vomiting     Home blood sugar records: fasting range: 125-110's  Episodes of hypoglycemia? No   Most Recent Eye Exam: last month 09/2019 she is to follow up in a year. Reports that everything was good  Pertinent Labs: Lab Results  Component Value Date   CHOL 123 12/28/2018   HDL 64 12/28/2018   LDLCALC 39 12/28/2018   TRIG 115 12/28/2018   CHOLHDL 1.9 12/28/2018   Lab Results  Component Value Date   NA 140 12/28/2018   K 4.3 12/28/2018   CREATININE 1.02 (H) 12/28/2018   GFRNONAA 55 (L) 12/28/2018   GFRAA 63 12/28/2018   GLUCOSE 91 12/28/2018     ---------------------------------------------------------------------------------------------------  Patient Active Problem List   Diagnosis Date Noted  . PAD (peripheral artery disease) ([place]) 12/28/2018  . Moderate episode of recurrent major depressive disorder ([place]) 12/19/2017  . Leg length discrepancy  09/21/2017  . Coronary artery disease of native artery of native heart with stable angina pectoris ([place]) 01/24/2017  . Drug-induced constipation 01/06/2017  . Spinal stenosis of lumbar region 01/06/2017  . Spondylolisthesis of lumbar region 12/30/2016  . Obese 04/16/2015  . History of colon polyps 11/23/2012  . Bundle branch block, 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  . 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 Encounter for long-term (current) use of aspirin 11/23/2012  . Status post percutaneous transluminal coronary angioplasty 11/23/2012  . Transient ischemic attack (TIA), and cerebral infarction without residual deficits(V12.54) 11/23/2012  . Absolute anemia 08/17/2012  . Chronic obstructive pulmonary disease ([place]) 08/17/2012  . HLD (hyperlipidemia) 08/17/2012  . OP (osteoporosis) 08/17/2012  . 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 edema, with long-term current use of insulin ([name]) 08/17/2012  . Chronic systolic heart failure ([place]) 07/30/2012   Past Medical History:  Diagnosis Date  . Bronchitis   . Chickenpox   . Coronary artery disease   . Depression   . Diabetes mellitus type 2, uncomplicated ([place])   . Diabetes mellitus without complication ([place])   . Diabetic retinopathy ([place])   . Dupuytren contracture   . Frequent urinary tract infections   . GERD (gastroesophageal reflux disease)   . History of hand surgery 2011   left hand  . Hyperlipidemia, unspecified   . Irritable bowel  syndrome   . MI (myocardial infarction) ([place]) 1994  . Neuromuscular disorder ([place])    nerve pain in back and legs  . Osteoporosis   . Pneumonia 2014  . Stroke ([place])   . TIA (transient ischemic attack)   . Vitamin D deficiency, unspecified   . Wheezing        Medications: Outpatient Medications Prior to Visit  Medication Sig  . Alcohol Swabs (ALCOHOL PREP) PADS Use twice  daily prior to SQ injection of insulin to clean skin  . aspirin EC 81 MG tablet Take 81 mg daily by mouth.  [name] Kitchen atorvastatin (LIPITOR) 80 MG tablet TAKE 1 TABLET BY MOUTH EVERY DAY  . Calcium Carb-Cholecalciferol ([employer] CALCIUM 600+D) 600-800 MG-UNIT TABS Take 1 tablet by mouth once daily  . Cyanocobalamin (B-12) 1000 MCG CAPS Take 1,000 mcg by mouth daily.   [name] Kitchen HYDROcodone-acetaminophen (NORCO) 10-325 MG tablet Take 1 tablet by mouth every 8 (eight) hours as needed.  . Insulin Syringe-Needle U-100 (INSULIN SYRINGE .3CC/29GX1") 29G X 1" 0.3 ML MISC To inject lantus 65 units SQ BIDWC  . LANTUS 100 UNIT/ML injection INJECT 65 UNITS INTO THE SKIN TWICE DAILY WITH A MEAL  . losartan (COZAAR) 25 MG tablet TAKE 1 TABLET BY MOUTH EVERY DAY  . metoprolol succinate (TOPROL-XL) 25 MG 24 hr tablet Take 1 tablet (25 mg total) by mouth daily.  . Multiple Vitamins-Minerals (ICAPS AREDS 2 PO) Take 1 capsule by mouth 2 (two) times daily.   [name] Kitchen nystatin (MYCOSTATIN/NYSTOP) powder Apply 1 application topically 3 (three) times daily.  . Omega-3 Fatty Acids (FISH OIL) 1200 MG CAPS Take 1 capsule (1,200 mg total) by mouth daily.  [name] Kitchen omeprazole (PRILOSEC) 40 MG capsule TAKE 1 CAPSULE BY MOUTH EVERY DAY  . OneTouch Delica Lancets 03T MISC To check blood sugar daily  DX: E11.9  . ONETOUCH VERIO test strip TO CHECK BLOOD SUGAR ONCE DAILY.  [name] Kitchen oxybutynin (DITROPAN-XL) 10 MG 24 hr tablet TAKE 1 TABLET BY MOUTH EVERYDAY AT BEDTIME  . sertraline (ZOLOFT) 25 MG tablet Take 1 tablet (25 mg) by mouth once daily as needed  . TRULICITY 1.5 DH/7.4BU SOPN INJECT 1.5MG  INTO THE SKIN ONCE A WEEK AS DIRECTED  . Vitamin D, Ergocalciferol, (DRISDOL) 1.25 MG (50000 UNIT) CAPS capsule TAKE ONE CAPSULE BY MOUTH ONCE WEEKLY ON THE SAME DAY EACH WEEK (ON FRIDAYS)  . [DISCONTINUED] albuterol (VENTOLIN HFA) 108 (90 Base) MCG/ACT inhaler Inhale 2 puffs into the lungs every 6 (six) hours as needed for wheezing or shortness of breath.  . [DISCONTINUED]  nitrofurantoin (MACRODANTIN) 100 MG capsule Take 100 mg at bedtime by mouth.   No facility-administered medications prior to visit.    Review of Systems  Constitutional: Negative for appetite change, chills, fatigue and fever.  Respiratory: Negative for chest tightness and shortness of breath.   Cardiovascular: Negative for chest pain and palpitations.  Gastrointestinal: Negative for abdominal pain, nausea and vomiting.  Neurological: Negative for dizziness and weakness.    Last CBC Lab Results  Component Value Date   WBC 8.8 12/28/2018   HGB 13.1 12/28/2018   HCT 38.7 12/28/2018   MCV 90 12/28/2018   MCH 30.5 12/28/2018   RDW 13.2 12/28/2018   PLT 170 38/45/3646   Last metabolic panel Lab Results  Component Value Date   GLUCOSE 91 12/28/2018   NA 140 12/28/2018   K 4.3 12/28/2018   CL 103 12/28/2018   CO2 23 12/28/2018   BUN 27 12/28/2018   CREATININE 1.02 (  H) 12/28/2018   GFRNONAA 55 (L) 12/28/2018   GFRAA 63 12/28/2018   CALCIUM 10.5 (H) 12/28/2018   PROT 6.9 12/28/2018   ALBUMIN 4.3 12/28/2018   LABGLOB 2.6 12/28/2018   AGRATIO 1.7 12/28/2018   BILITOT 0.9 12/28/2018   ALKPHOS 118 (H) 12/28/2018   AST 15 12/28/2018   ALT 14 12/28/2018   ANIONGAP 8 01/01/2017      Objective    BP 122/69 (BP Location: Left Arm, Patient Position: Sitting, Cuff Size: Large)   Pulse 96   Temp 98.2 F (36.8 C) (Oral)   Resp 16   Wt 159 lb 9.6 oz (72.4 kg)   BMI 31.17 kg/m  BP Readings from Last 3 Encounters:  10/11/19 122/69  07/05/19 124/68  04/26/19 129/75   Wt Readings from Last 3 Encounters:  10/11/19 159 lb 9.6 oz (72.4 kg)  07/05/19 159 lb (72.1 kg)  04/26/19 154 lb (69.9 kg)      Physical Exam Vitals reviewed.  Constitutional:      General: She is not in acute distress.    Appearance: Normal appearance. She is well-developed. She is obese. She is not ill-appearing or diaphoretic.  Cardiovascular:     Rate and Rhythm: Normal rate and regular rhythm.      Pulses: Normal pulses.     Heart sounds: Normal heart sounds. No murmur heard.  No friction rub. No gallop.   Pulmonary:     Effort: Pulmonary effort is normal. No respiratory distress.     Breath sounds: Normal breath sounds. No wheezing or rales.  Musculoskeletal:     Cervical back: Normal range of motion and neck supple.  Neurological:     Mental Status: She is alert.  Psychiatric:        Mood and Affect: Mood normal.        Thought Content: Thought content normal.      Results for orders placed or performed in visit on 10/11/19  POCT glycosylated hemoglobin (Hb A1C)  Result Value Ref Range   Hemoglobin A1C 8.1 (A) 4.0 - 5.6 %    Assessment & Plan     1. 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 edema, with long-term current use of insulin (HCC) A1c stable at 8.1. Continue Lantus 65 units SQ BID and trulicity 1.5mg  SQ weekly. Long discussion over appropriate dietary habits. F/U in 3 months for CPE/AWV.   2. Simple chronic bronchitis (HCC) Stable. No treatment currently required. Patient no longer smokes, quit long time ago.  3. Chronic systolic heart failure (HCC) Stable. Continue Metoprolol XR 25mg  daily. Followed by Dr. Rockey Situ, Cardiology as well.   4. Recurrent major depressive disorder, in full remission ([place]) Stable. On Sertraline 25mg  daily.   5. Coronary artery disease of native artery of native heart with stable angina pectoris (HCC) Stable. Continue statin and ASA. Continue medications to control BP and sugar.   Return in about 3 months (around 01/11/2020).      Reynolds Bowl, PA-C, have reviewed all documentation for this visit. The documentation on 10/11/19 for the exam, diagnosis, procedures, and orders are all accurate and complete.   Rubye Beach  [facility] [phone] (phone) [phone] (fax)  [place]

## 2019-10-15 ENCOUNTER — Other Ambulatory Visit: Payer: Self-pay | Admitting: Physician Assistant

## 2019-10-15 DIAGNOSIS — Z794 Long term (current) use of insulin: Secondary | ICD-10-CM

## 2019-10-15 DIAGNOSIS — E113293 Type 2 diabetes mellitus with mild nonproliferative diabetic retinopathy without macular edema, bilateral: Secondary | ICD-10-CM

## 2019-10-15 DIAGNOSIS — M4316 Spondylolisthesis, lumbar region: Secondary | ICD-10-CM

## 2019-10-15 DIAGNOSIS — E1142 Type 2 diabetes mellitus with diabetic polyneuropathy: Secondary | ICD-10-CM

## 2019-10-15 MED ORDER — INSULIN GLARGINE 100 UNIT/ML ~~LOC~~ SOLN: SUBCUTANEOUS | 4 refills | Status: DC

## 2019-10-15 MED ORDER — HYDROCODONE-ACETAMINOPHEN 10-325 MG PO TABS: 1.0000 | ORAL_TABLET | Freq: Three times a day (TID) | ORAL | 0 refills | Status: DC | PRN

## 2019-10-15 MED ORDER — "INSULIN SYRINGE 29G X 1/2"" 1 ML MISC": 1 refills | Status: DC

## 2019-10-15 NOTE — Telephone Encounter (Signed)
Pt was sent the syringes for her insulin. Pt stated she receive th 30 units but needs 100 units or she would have to stick herself at least three times. / Pt stated she doesn't know what to do with the box she has now and would like a call to advise her what to do with the 30 unit box/ please advise asap   Pt also needs a refill on her HYDROcodone-acetaminophen (NORCO) 10-325 MG tablet

## 2019-10-15 NOTE — Telephone Encounter (Signed)
Norco refilled. Insulin syringes corrected.

## 2019-10-17 ENCOUNTER — Encounter
Admission: RE | Admit: 2019-10-17 | Discharge: 2019-10-17 | Disposition: A | Discharge status: Home / Self Care | Payer: Medicare Other | Source: Ambulatory Visit | Attending: Orthopedic Surgery | Admitting: Orthopedic Surgery

## 2019-10-17 ENCOUNTER — Other Ambulatory Visit: Payer: Self-pay

## 2019-10-17 NOTE — Patient Instructions (Signed)
COVID TESTING Date: October 23, 2019 TUESDAY Testing site:  [facility] [address] Thru Hours:  4:01 am - 1:00 pm Once you are tested, you are asked to stay quarantined (avoiding public places) until after your surgery.   Your procedure is scheduled on: October 25, 2019 THURSDAY Report to Day Surgery on the 2nd floor of the [facility]. To find out your arrival time, please call [phone] between 1PM - 3PM on: Wednesday October 24, 2019  REMEMBER: Instructions that are not followed completely may result in serious medical risk, up to and including death; or upon the discretion of your surgeon and anesthesiologist your surgery may need to be rescheduled.  Do not eat food after midnight the night before surgery.  No gum chewing, lozengers or hard candies.  You may however, drink CLEAR liquids up to 2 hours before you are scheduled to arrive for your surgery. Do not drink anything within 2 hours of your scheduled arrival time.  Clear liquids include: - water   Do NOT drink anything that is not on this list.  Type 1 and Type 2 diabetics should only drink water.  G2 GATORADE  DRINK:  Complete drinking 2 hours prior to scheduled arrival time.  TAKE THESE MEDICATIONS THE MORNING OF SURGERY WITH A SIP OF WATER: OXYBUTYNIN METOPROLOL OMEPRAZOLE (take one the night before and one on the morning of surgery - helps to prevent nausea after surgery.)  NO INSULIN MORNING OF SURGERY  Follow recommendations from Cardiologist, Pulmonologist or PCP regarding stopping Aspirin, Coumadin, Plavix, Eliquis, Pradaxa, or Pletal.  Stop Anti-inflammatories (NSAIDS) such as Advil, Aleve, Ibuprofen, Motrin, Naproxen, Naprosyn and ASPIRIN AND Aspirin based products such as Excedrin, Goodys Powder, BC Powder. (May take Tylenol or Acetaminophen if needed.)  Stop ANY OVER THE COUNTER supplements until after surgery. STOP FISH OIL (May continue Vitamin D, Vitamin B, and  multivitamin.)  No Alcohol for 24 hours before or after surgery.  No Smoking including e-cigarettes for 24 hours prior to surgery.  No chewable tobacco products for at least 6 hours prior to surgery.  No nicotine patches on the day of surgery.  Do not use any "recreational" drugs for at least a week prior to your surgery.  Please be advised that the combination of cocaine and anesthesia may have negative outcomes, up to and including death. If you test positive for cocaine, your surgery will be cancelled.  On the morning of surgery brush your teeth with toothpaste and water, you may rinse your mouth with mouthwash if you wish. Do not swallow any toothpaste or mouthwash.  Do not wear jewelry, make-up, hairpins, clips or nail polish.  Do not wear lotions, powders, or perfumes OR  DEODORANT  Do not shave 48 hours prior to surgery   Contact lenses, hearing aids and dentures may not be worn into surgery.  Do not bring valuables to the hospital. [facility] is not responsible for any missing/lost belongings or valuables.   Use CHG Soap  as directed on instruction sheet.  Notify your doctor if there is any change in your medical condition (cold, fever, infection).  Wear comfortable clothing (specific to your surgery type) to the hospital.  Plan for stool softeners for home use; pain medications have a tendency to cause constipation. You can also help prevent constipation by eating foods high in fiber such as fruits and vegetables and drinking plenty of fluids as your diet allows.  After surgery, you can help prevent lung  complications by doing breathing exercises.  Take deep breaths and cough every 1-2 hours. Your doctor may order a device called an Incentive Spirometer to help you take deep breaths. When coughing or sneezing, hold a pillow firmly against your incision with both hands. This is called "splinting." Doing this helps protect your incision. It also decreases belly  discomfort.  If you are being discharged the day of surgery, you will not be allowed to drive home. You will need a responsible adult (18 years or older) to drive you home and stay with you that night.   Please call the [facility] at [phone] if you have any questions about these instructions.  Visitation Policy:  Patients undergoing a surgery or procedure may have one family member or support person with them as long as that person is not COVID-19 positive or experiencing its symptoms.  That person may remain in the waiting area during the procedure.  Children under 17 years of age may have both parents or legal guardians with them during their procedure.  Inpatient Visitation Update:   In an effort to ensure the safety of our team members and our patients, we are implementing a change to our visitation policy:  Effective Monday, Aug. 9, at 7 a.m., inpatients will be allowed one support person.  o The support person may change daily.  o The support person must pass our screening, gel in and out, and wear a mask at all times, including in the patient's room.  o Patients must also wear a mask when staff or their support person are in the room.  o Masking is required regardless of vaccination status.  Systemwide, no visitors 17 or younger.

## 2019-10-23 ENCOUNTER — Other Ambulatory Visit
Admission: RE | Admit: 2019-10-23 | Discharge: 2019-10-23 | Disposition: A | Discharge status: Home / Self Care | Payer: Medicare Other | Source: Ambulatory Visit | Attending: Orthopedic Surgery | Admitting: Orthopedic Surgery

## 2019-10-23 ENCOUNTER — Other Ambulatory Visit: Payer: Self-pay

## 2019-10-23 ENCOUNTER — Encounter: Payer: Self-pay | Admitting: Urgent Care

## 2019-10-23 ENCOUNTER — Telehealth: Payer: Self-pay | Admitting: Physician Assistant

## 2019-10-23 DIAGNOSIS — Z20822 Contact with and (suspected) exposure to covid-19: Secondary | ICD-10-CM | POA: Diagnosis not present

## 2019-10-23 DIAGNOSIS — E118 Type 2 diabetes mellitus with unspecified complications: Secondary | ICD-10-CM | POA: Insufficient documentation

## 2019-10-23 DIAGNOSIS — Z01812 Encounter for preprocedural laboratory examination: Secondary | ICD-10-CM | POA: Diagnosis not present

## 2019-10-23 LAB — BASIC METABOLIC PANEL
Anion gap: 11 (ref 5–15)
BUN: 25 mg/dL — ABNORMAL HIGH (ref 8–23)
CO2: 26 mmol/L (ref 22–32)
Calcium: 9.4 mg/dL (ref 8.9–10.3)
Chloride: 101 mmol/L (ref 98–111)
Creatinine, Ser: 0.98 mg/dL (ref 0.44–1.00)
GFR calc Af Amer: >60 mL/min (ref >60)
GFR calc non Af Amer: 57 mL/min — ABNORMAL LOW (ref >60)
Glucose, Bld: 177 mg/dL — ABNORMAL HIGH (ref 70–99)
Potassium: 3.9 mmol/L (ref 3.5–5.1)
Sodium: 138 mmol/L (ref 135–145)

## 2019-10-23 LAB — CBC
HCT: 37.3 % (ref 36.0–46.0)
Hemoglobin: 12.2 g/dL (ref 12.0–15.0)
MCH: 30.2 pg (ref 26.0–34.0)
MCHC: 32.7 g/dL (ref 30.0–36.0)
MCV: 92.3 fL (ref 80.0–100.0)
Platelets: 182 10*3/uL (ref 150–400)
RBC: 4.04 MIL/uL (ref 3.87–5.11)
RDW: 13.7 % (ref 11.5–15.5)
WBC: 8.9 10*3/uL (ref 4.0–10.5)
nRBC: 0 % (ref 0.0–0.2)

## 2019-10-23 LAB — SARS CORONAVIRUS 2 (TAT 6-24 HRS): SARS Coronavirus 2: NEGATIVE

## 2019-10-23 NOTE — Telephone Encounter (Signed)
This encounter was created in error - please disregard.

## 2019-10-23 NOTE — Telephone Encounter (Addendum)
Request for pre-operative cardiac clearance:  1. What type of surgery is being performed? Removal of orthopedic hardware  2. When is this surgery scheduled? 10/25/2019  3. Are there any medications that need to be held prior to surgery? ASA  4. Practice name and name of physician performing surgery?  Performing surgeon: Dr. Michael Menz, MD Requesting clearance: Bryan Gray, FNP-C with [facility].   5. Anesthesia type (none, local, MAC, general)? General   6. What is the office phone and fax number?  [phone] (phone)   [phone] (fax) ATTENTION: Once cleared, or if further information is required, please route message back to Bryan Gray, FNP-C via [facility]. No need to FAX to surgeon or PAT office. Note to be reviewed by APP in [facility]. Unable to create telephone message as per your standard workflow. Directed by [facility] providers to send requests for cardiac clearance to this pool for appropriate distribution to provider covering pre-operative clearances.   Note: EKG showing new FAFB. 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   Bryan Gray, MSN, APRN, FNP-C, CEN [facility]  Peri-operative Services Nurse Practitioner Phone: [phone] 10/23/19 11:42 AM    

## 2019-10-23 NOTE — Telephone Encounter (Signed)
-----   Message from Mendel Ryder, [place] sent at 10/23/2019 11:56 AM EDT ----- Request for pre-operative cardiac clearance:  1. What type of surgery is being performed? Removal of orthopedic hardware  2. When is this surgery scheduled? 10/25/2019  3. Are there any medications that need to be held prior to surgery? ASA  4. Practice name and name of physician performing surgery?  Performing surgeon: Dr. Hessie Knows, MD Requesting clearance: Honor Loh, FNP-C with [facility] PAT.   5. Anesthesia type (none, local, MAC, general)? General   6. What is the office phone and fax number?  [phone] (phone)   [phone] (fax) ATTENTION: Once cleared, or if further information is required, please route message back to Honor Loh, FNP-C via [facility]. No need to FAX to surgeon or PAT office. Note to be reviewed by APP in [facility]. Unable to create telephone message as per your standard workflow. Directed by [facility] providers to send requests for cardiac clearance to this pool for appropriate distribution to provider covering pre-operative clearances.   Note: EKG showing new FAFB. 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   Honor Loh, MSN, APRN, FNP-C, CEN [facility]  Peri-operative Services Nurse Practitioner Phone: [phone] 10/23/19 11:42 AM

## 2019-10-23 NOTE — Progress Notes (Signed)
[facility] Perioperative Services  Pre-Admission/Anesthesia Testing Clinical Review  Date: 10/23/19  Patient Demographics:  Name: Andrea [name] DOB:   07-16-1945 MRN:   [id number]  Planned Surgical Procedure(s):    Case: [id number] Date/Time: 10/25/19 0700   Procedure: HARDWARE REMOVAL (Left )   Anesthesia type: Choice   Pre-op diagnosis:      Painful orthopaedic hardware T84.84XA     Greater trochanteric bursitis of left hip M70.62   Location: [facility] OR ROOM 01 / [place] ORS FOR ANESTHESIA GROUP   Surgeons: Hessie Knows, MD     NOTE: Available PAT nursing documentation and vital signs have been reviewed. Clinical nursing staff has updated patient's PMH/PSHx, current medication list, and drug allergies/intolerances to ensure comprehensive history available to assist in medical decision making as it pertains to the aforementioned surgical procedure and anticipated anesthetic course.   Clinical Discussion:  Andrea [name] is a 74 y.o. female who is submitted for pre-surgical anesthesia review and clearance prior to her undergoing the above procedure. Patient is a Former Smoker (30 pack years; quit 07/2012). Pertinent PMH includes: CAD, MI (1994), HRfEF, RBBB, 1st degree AV block, HLD, T2DM, TIA, CVA, GERD, anemia, PAD, spinal stenosis, depression.   Patient is followed by cardiology Rockey Situ, MD). She was last seen in by cardiology via tele-health on 04/26/2019; notes reviewed. Patient reporting that she has been predominantly w/c bound since her hip surgery; can ambulate with use of walker at home. At the time of her visit the patient denied any angina, palpitations, shortness of breath, orthopnea, PND, dizziness, or presyncope/syncope. Patient's only complaint was pain in her hip for which she is planning to have surgery to remove fixation hardware.TTE in 05/2017 revealed an LVEF of 55-60%; improved since 2014 when EF was 45-50%. . Subsequent Lexiscan demonstrated a  fixed defect to the mid-distal anterior wall from her previous MI. Patient compliant with prescribed beta blocker and ARB therapy.This patient is on daily antiplatelet therapy.    Given patient's PMH and EKG today (new LAFB) in PAT, pre-surgical clearance was sought prior to patient undergoing anesthesia. Cardiology unable to clear at this time. They are requesting that patient be seen for a face to face clearance visit due to her multiple co-morbid conditions. [facility] to contact patient to schedule.   She denies previous intra-operative complications with anesthesia. She underwent a general anesthetic course here (ASA III) in 02/2019 with no documented complications.   Vitals with BMI 10/17/2019 10/11/2019 07/05/2019  Height 5\' 0"  - -  Weight 156 lbs 159 lbs 10 oz 159 lbs  BMI 67.20 - -  Systolic - 947 096  Diastolic - 69 68  Pulse - 96 93    Providers/Specialists:   NOTE: Primary physician provider listed below. Patient may have been seen by APP or partner within same practice.   PROVIDER ROLE LAST Fabio Bering, MD Orthopedics (Surgeon) 10/05/2019  Mar Daring, PA-C Primary Care Provider 10/11/2019  Ida Rogue, MD Cardiology 04/26/2019   Allergies:  Metformin and related and Sulfa antibiotics  Current Home Medications:   No current facility-administered medications for this encounter.    aspirin EC 81 MG tablet   atorvastatin (LIPITOR) 80 MG tablet   Calcium Carb-Cholecalciferol ([employer] CALCIUM 600+D) 600-800 MG-UNIT TABS   Cyanocobalamin (B-12) 1000 MCG CAPS   losartan (COZAAR) 25 MG tablet   metoprolol succinate (TOPROL-XL) 25 MG 24 hr tablet   Multiple Vitamins-Minerals (ICAPS AREDS 2 PO)   Omega-3 Fatty Acids (FISH  OIL) 1200 MG CAPS   omeprazole (PRILOSEC) 40 MG capsule   oxybutynin (DITROPAN-XL) 10 MG 24 hr tablet   sertraline (ZOLOFT) 25 MG tablet   TRULICITY 1.5 OE/7.0JJ SOPN   Vitamin D, Ergocalciferol, (DRISDOL) 1.25 MG (50000  UNIT) CAPS capsule   Alcohol Swabs (ALCOHOL PREP) PADS   HYDROcodone-acetaminophen (NORCO) 10-325 MG tablet   insulin glargine (LANTUS) 100 UNIT/ML injection   INSULIN SYRINGE 1CC/29G 29G X 1/2" 1 ML MISC   nystatin (MYCOSTATIN/NYSTOP) powder   OneTouch Delica Lancets 00X MISC   ONETOUCH VERIO test strip   History:   Past Medical History:  Diagnosis Date   Bronchitis    Chickenpox    Coronary artery disease    Depression    Diabetes mellitus type 2, uncomplicated (HCC)    Diabetes mellitus without complication ([place])    Diabetic retinopathy ([place])    Dupuytren contracture    Frequent urinary tract infections    GERD (gastroesophageal reflux disease)    History of hand surgery 2011   left hand   Hyperlipidemia, unspecified    Irritable bowel syndrome    MI (myocardial infarction) ([employer]) 1994   Neuromuscular disorder ([place])    nerve pain in back and legs   Osteoporosis    Pneumonia 2014   Stroke ([place])    TIA (transient ischemic attack)    Vitamin D deficiency, unspecified    Wheezing    Past Surgical History:  Procedure Laterality Date   CARDIAC CATHETERIZATION  1994   COLONOSCOPY     x3   COLONOSCOPY WITH PROPOFOL N/A 02/08/2019   Procedure: COLONOSCOPY WITH PROPOFOL;  Surgeon: [name], MD;  Location: [facility] ENDOSCOPY;  Service: Gastroenterology;  Laterality: N/A;   CORONARY ANGIOPLASTY  1994   EYE SURGERY Bilateral    cataracts   HAND SURGERY Left 2011   left hand release of contractures   HIP FRACTURE SURGERY  11/2013   PR COLSC FLX W/REMOVAL LESION BY HOT BX FORCEPS   08/21/2015   Procedure: COLONOSCOPY, FLEXIBLE, PROXIMAL TO SPLENIC FLEXURE; W/REMOVAL TUMOR/POLYP/OTHER LESION, HOT BX FORCEP/CAUTE; Surgeon: Carlena Hurl, MD; Location: OR [place]; Service: General Surgery   Family History  Problem Relation Age of Onset   Depression Daughter    Arthritis Daughter        spine   Plantar fasciitis Daughter     Anxiety disorder Daughter    Hyperlipidemia Daughter    AAA (abdominal aortic aneurysm) Daughter    Hypertension Mother    Stroke Mother    Cataracts Mother    Depression Mother    Heart disease Mother    Asthma Brother    Diabetes Daughter    Hyperlipidemia Daughter    Hypertension Daughter    Social History   Tobacco Use   Smoking status: Former Smoker    Packs/day: 1.00    Years: 30.00    Pack years: 30.00    Types: Cigarettes    Quit date: 07/17/2012    Years since quitting: 7.2   Smokeless tobacco: Never Used   Tobacco comment:    Vaping Use   Vaping Use: Never used  Substance Use Topics   Alcohol use: Not Currently    Alcohol/week: 0.0 standard drinks   Drug use: No    Pertinent Clinical Results:  LABS: Labs reviewed: Acceptable for surgery.  Hospital Outpatient Visit on 10/23/2019  Component Date Value Ref Range Status   Sodium 10/23/2019 138  135 - 145 mmol/L Final   Potassium 10/23/2019 3.9  3.5 - 5.1 mmol/L Final   Chloride 10/23/2019 101  98 - 111 mmol/L Final   CO2 10/23/2019 26  22 - 32 mmol/L Final   Glucose, Bld 10/23/2019 177* 70 - 99 mg/dL Final   Glucose reference range applies only to samples taken after fasting for at least 8 hours.   BUN 10/23/2019 25* 8 - 23 mg/dL Final   Creatinine, Ser 10/23/2019 0.98  0.44 - 1.00 mg/dL Final   Calcium 10/23/2019 9.4  8.9 - 10.3 mg/dL Final   GFR calc non Af Amer 10/23/2019 57* >60 mL/min Final   GFR calc Af Amer 10/23/2019 >60  >60 mL/min Final   Anion gap 10/23/2019 11  5 - 15 Final   Performed at [facility], [address] [place], [place] [address]   WBC 10/23/2019 8.9  4.0 - 10.5 K/uL Final   RBC 10/23/2019 4.04  3.87 - 5.11 MIL/uL Final   Hemoglobin 10/23/2019 12.2  12.0 - 15.0 g/dL Final   HCT 10/23/2019 37.3  36 - 46 % Final   MCV 10/23/2019 92.3  80.0 - 100.0 fL Final   MCH 10/23/2019 30.2  26.0 - 34.0 pg Final   MCHC 10/23/2019 32.7  30.0 - 36.0  g/dL Final   RDW 10/23/2019 13.7  11.5 - 15.5 % Final   Platelets 10/23/2019 182  150 - 400 K/uL Final   nRBC 10/23/2019 0.0  0.0 - 0.2 % Final   Performed at [facility], [address] [place], [place] [address]    ECG: Date: 10/23/2019 Time ECG obtained: 1122 AM Rate: 88 bpm Rhythm: SR with 1st degree AVB; RBBB; LAFB (trifasicular block) Intervals: PR 220 ms. QTc 498 ms. ST segment and T wave changes: No evidence of acute ST segment elevation or depression Comparison: New LAFB present since last tracing on 04/22/2017   IMAGING / PROCEDURES: ECHOCARDIOGRAM done on 05/27/2016 1. Left ventricle:  Systolic function was normal.   The estimated ejection fraction was in the range of 55% to 60%.   Doppler parameters are consistent with abnormal left ventricularrelaxation (grade 1 diastolic dysfunction). 2. Right ventricle  Cavity size was normal.  Systolic function was normal 3. PASP could not be accurately estimated.  LEXISCAN done on 05/12/2016 1. Pharmacological myocardial perfusion imaging study with no significant ischemia. 2. Large region of severe fixed perfusion defect in the mid anterior wall extending to apical region, anteroseptal wall, apical, apical inferior region, consistent with previous MI 3. Large region of Wall motion abnormality noted in the region above 4. EF estimated at 56% 5. No EKG changes concerning for ischemia at peak stress or in recovery. 6. Baseline EKG concerning for old anterior MI. 7. At least moderate risk scan given size of perfusion defect   Impression and Plan:  Andrea [name] has been referred for pre-anesthesia review and clearance prior her undergoing the planned anesthetic and procedural courses. Available labs, pertinent testing, and imaging results were personally reviewed by me. Patient has multiple co-morbidities and new findings on her ECG today (LAFB). Spoke who recommends an in person evaluation for pre-surgical  clearance and risk stratification. This is certainly felt to be reasonable as this is an elective orthopedic procedure for pain that has been present for > 5 years. Cardiology office to contact patient with appointment for evaluation in person by Dr. Rockey Situ or one of the practice APPs. Due to the timing of her procedure (10/25/2019), it is likely that it will have to be re-scheduled. Cardiology  practice to make patient aware. I will notify surgeon and SDS.    Based on clinical review performed today (10/23/19), barring any significant acute changes in the patient's overall condition, it is anticipated that she will be able to proceed with the planned surgical intervention. This of course if contingent upon the appropriate clearance being provided by cardiology. Any acute changes in clinical condition may necessitate her procedure being postponed further and/or cancelled. Pre-surgical instructions were reviewed with the patient during her PAT appointment and questions were fielded by PAT clinical staff.  Honor Loh, MSN, APRN, FNP-C, CEN [facility]  Peri-operative Services Nurse Practitioner Phone: [phone] 10/23/19 3:09 PM  NOTE: This note has been prepared using Dragon dictation software. Despite my best ability to proofread, there is always the potential that unintentional transcriptional errors may still occur from this process.

## 2019-10-23 NOTE — Telephone Encounter (Signed)
   Primary Cardiologist:Timothy Rockey Situ, MD  Chart reviewed as part of pre-operative protocol coverage.   The patient has a hx of coronary artery disease s/p MI in the 1990s, peripheral arterial disease, prior stroke, IDDM, HTN.  Her last echocardiogram in 2018 demonstrated normal EF and her last Myoview in 2018 demonstrated a lg scar but no ischemia.  She was last seen in 04/2019 via Telemedicine.  Because of SRAH AKE past medical history and time since last visit (6 mos), they will require a follow-up visit in order to better assess preoperative cardiovascular risk.  Date of surgery is 10/25/2019.  I have reviewed with Honor Loh, FNP-C at [facility] PAT who notes that surgery is not urgent and can be postponed.  Pre-op covering staff: - Please schedule appointment and call patient to inform them.  - The patient has an appt with Dr. Rockey Situ 8/24.  If possible schedule sooner. - Please add "pre-op clearance" to the appointment notes so provider is aware. - Please contact requesting surgeon's office via preferred method (i.e, phone, fax) to inform them of need for appointment prior to surgery.  If applicable, this message will also be routed to pharmacy pool and/or primary cardiologist for input on holding anticoagulant/antiplatelet agent as requested below so that this information is available to the clearing provider at time of patient's appointment. (Dr. Rockey Situ - Please comment on ASA in case pt is seen sooner that scheduled appt with you on 8/24 and route to P CV DIV PREOP).   Richardson Dopp, PA-C  10/23/2019, 1:34 PM

## 2019-10-23 NOTE — Telephone Encounter (Addendum)
Request for pre-operative cardiac clearance:  1. What type of surgery is being performed? Removal of orthopedic hardware  2. When is this surgery scheduled? 10/25/2019  3. Are there any medications that need to be held prior to surgery? ASA  4. Practice name and name of physician performing surgery?  Performing surgeon: Dr. Hessie Knows, MD Requesting clearance: Honor Loh, FNP-C with [facility] PAT.   5. Anesthesia type (none, local, MAC, general)? General   6. What is the office phone and fax number?  [phone] (phone)   [phone] (fax) ATTENTION: Once cleared, or if further information is required, please route message back to Honor Loh, FNP-C via [facility]. No need to FAX to surgeon or PAT office. Note to be reviewed by APP in [facility]. Unable to create telephone message as per your standard workflow. Directed by [facility] providers to send requests for cardiac clearance to this pool for appropriate distribution to provider covering pre-operative clearances.   Note: EKG showing new FAFB. 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   Honor Loh, MSN, APRN, FNP-C, CEN [facility]  Peri-operative Services Nurse Practitioner Phone: [phone] 10/23/19 11:42 AM

## 2019-10-25 ENCOUNTER — Other Ambulatory Visit: Payer: Self-pay | Admitting: Physician Assistant

## 2019-10-25 ENCOUNTER — Encounter: Admission: RE | Payer: Self-pay | Source: Ambulatory Visit

## 2019-10-25 ENCOUNTER — Ambulatory Visit: Admission: RE | Admit: 2019-10-25 | Payer: Medicare Other | Source: Ambulatory Visit | Admitting: Orthopedic Surgery

## 2019-10-25 DIAGNOSIS — E1142 Type 2 diabetes mellitus with diabetic polyneuropathy: Secondary | ICD-10-CM

## 2019-10-25 SURGERY — REMOVAL, HARDWARE
Anesthesia: Choice | Laterality: Left

## 2019-10-25 NOTE — Telephone Encounter (Signed)
Requested Prescriptions  Pending Prescriptions Disp Refills   ONETOUCH VERIO test strip [Pharmacy Med Name: ONE TOUCH VERIO TEST STRIP] 100 strip 3    Sig: TO CHECK BLOOD SUGAR ONCE DAILY.     Endocrinology: Diabetes - Testing Supplies Passed - 10/25/2019  1:45 AM      Passed - Valid encounter within last 12 months    Recent Outpatient Visits          2 weeks ago 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 edema, with long-term current use of insulin [facility])   [facility] [place], 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

## 2019-10-29 NOTE — Progress Notes (Signed)
Date:  10/30/2019   ID:  Andrea, [name] 1945/07/26, MRN [id number]  Patient Location:  [name] [address]   Provider location:   [facility], [place] office  PCP:  Mar Daring, PA-C  Cardiologist:  Arvid Right [facility]   Chief Complaint  Patient presents with  . other    Pre op clearance for left hip surgery; removal of exotosis. Meds reviewed by the pt. verbally. "doing well."      History of Present Illness:    Andrea [name] is a 74 y.o. female  past medical history of obesity,  Diabetes type II, hemoglobin A1c 8.1 Prior stroke/TIA Prior smoking, stopped 4 to 5 yrs ago hyperlipidemia,  right bundle branch block,  CAD myocardial infarction (in 1994-95) , mid anterior wall to apical region, anteroseptal, apical and apical inferior region systolic heart failure with ejection fraction of 45-50% in May 2014 Normal ejection fraction March 2018 Echo 2018: EF 55% Who presents for follow-up of her coronary artery disease, history of previous MI  Last office visit February 2019 Video visit with our office 04/2019   seen by vascular 03/22/2019.   Had lower extremity arterial Dopplers  Bilateral lower extremity duplex showing biphasic waveforms.  RLE had biphasic/triphasic waveforms.  Noted mild PAD  wheelchair bound since her hip surgery (prior fall leading to hip fracture ) having problems with the hardware per the patient, needs to have left hip surgery in the near future  Has a walker, presents today in a wheelchair No chest pain, no SOB Does not feel she is having any angina No edema  Prior stress test 2018 No ischemia  Echocardiogram 2018, normal ejection fraction 55 to 60%  Hemoglobin A1c has improved over the last several years  EKG personally reviewed by myself on todays visit NSR, old anterior mi LAD  Previous lab work  Total cholesterol 136, LDL 43    Prior CV studies:   The following studies  were reviewed today:    Past Medical History:  Diagnosis Date  . Bronchitis   . Chickenpox   . Coronary artery disease   . Depression   . Diabetes mellitus type 2, uncomplicated ([place])   . Diabetes mellitus without complication ([place])   . Diabetic retinopathy ([place])   . Dupuytren contracture   . Frequent urinary tract infections   . GERD (gastroesophageal reflux disease)   . History of hand surgery 2011   left hand  . Hyperlipidemia, unspecified   . Irritable bowel syndrome   . MI (myocardial infarction) ([place]) 1994  . Neuromuscular disorder ([place])    nerve pain in back and legs  . Osteoporosis   . Pneumonia 2014  . Stroke ([place])   . TIA (transient ischemic attack)   . Vitamin D deficiency, unspecified   . Wheezing    Past Surgical History:  Procedure Laterality Date  . CARDIAC CATHETERIZATION  1994  . COLONOSCOPY     x3  . COLONOSCOPY WITH PROPOFOL N/A 02/08/2019   Procedure: COLONOSCOPY WITH PROPOFOL;  Surgeon: [name], MD;  Location: [facility] ENDOSCOPY;  Service: Gastroenterology;  Laterality: N/A;  . CORONARY ANGIOPLASTY  1994  . EYE SURGERY Bilateral    cataracts  . HAND SURGERY Left 2011   left hand release of contractures  . HIP FRACTURE SURGERY  11/2013  . PR COLSC FLX W/REMOVAL LESION BY HOT BX FORCEPS   08/21/2015   Procedure: COLONOSCOPY, FLEXIBLE, PROXIMAL  TO SPLENIC FLEXURE; W/REMOVAL TUMOR/POLYP/OTHER LESION, HOT BX FORCEP/CAUTE; Surgeon: Carlena Hurl, MD; Location: [facility]; Service: General Surgery     Current Meds  Medication Sig  . Alcohol Swabs (ALCOHOL PREP) PADS Use twice daily prior to SQ injection of insulin to clean skin  . aspirin EC 81 MG tablet Take 81 mg daily by mouth.  [name] Kitchen atorvastatin (LIPITOR) 80 MG tablet TAKE 1 TABLET BY MOUTH EVERY DAY (Patient taking differently: Take 80 mg by mouth at bedtime. )  . Calcium Carb-Cholecalciferol ([employer]S CALCIUM 600+D) 600-800 MG-UNIT TABS Take 1 tablet by mouth in the morning and at bedtime.    . Cyanocobalamin (B-12) 1000 MCG CAPS Take 1,000 mcg by mouth daily.   [name] Kitchen HYDROcodone-acetaminophen (NORCO) 10-325 MG tablet Take 1 tablet by mouth every 8 (eight) hours as needed.  . insulin glargine (LANTUS) 100 UNIT/ML injection INJECT 65 UNITS INTO THE SKIN TWICE DAILY WITH A MEAL  . INSULIN SYRINGE 1CC/29G 29G X 1/2" 1 ML MISC For lantus injections twice daily  . losartan (COZAAR) 25 MG tablet TAKE 1 TABLET BY MOUTH EVERY DAY (Patient taking differently: Take 25 mg by mouth daily. )  . metoprolol succinate (TOPROL-XL) 25 MG 24 hr tablet Take 1 tablet (25 mg total) by mouth daily.  . Multiple Vitamins-Minerals (ICAPS AREDS 2 PO) Take 1 capsule by mouth 2 (two) times daily.   [name] Kitchen nystatin (MYCOSTATIN/NYSTOP) powder Apply 1 application topically 3 (three) times daily.  . Omega-3 Fatty Acids (FISH OIL) 1200 MG CAPS Take 1 capsule (1,200 mg total) by mouth daily. (Patient taking differently: Take 1,200 mg by mouth at bedtime. )  . omeprazole (PRILOSEC) 40 MG capsule TAKE 1 CAPSULE BY MOUTH EVERY DAY (Patient taking differently: Take 40 mg by mouth daily. )  . OneTouch Delica Lancets 01U MISC To check blood sugar daily  DX: E11.9  . ONETOUCH VERIO test strip TO CHECK BLOOD SUGAR ONCE DAILY.  [name] Kitchen oxybutynin (DITROPAN-XL) 10 MG 24 hr tablet TAKE 1 TABLET BY MOUTH EVERYDAY AT BEDTIME (Patient taking differently: Take 10 mg by mouth daily. )  . sertraline (ZOLOFT) 25 MG tablet Take 25 mg by mouth daily as needed (Anxiety).   . TRULICITY 1.5 XN/2.3FT SOPN INJECT 1.5MG  INTO THE SKIN ONCE A WEEK AS DIRECTED (Patient taking differently: Inject 1.5 mg into the skin every Wednesday. )  . Vitamin D, Ergocalciferol, (DRISDOL) 1.25 MG (50000 UNIT) CAPS capsule TAKE ONE CAPSULE BY MOUTH ONCE WEEKLY ON THE SAME DAY EACH WEEK (ON FRIDAYS) (Patient taking differently: Take 50,000 Units by mouth every 7 (seven) days. Friday)     Allergies:   Metformin and related and Sulfa antibiotics   Social History   Tobacco Use   . Smoking status: Former Smoker    Packs/day: 1.00    Years: 30.00    Pack years: 30.00    Types: Cigarettes    Quit date: 07/17/2012    Years since quitting: 7.2  . Smokeless tobacco: Never Used  . Tobacco comment:    Vaping Use  . Vaping Use: Never used  Substance Use Topics  . Alcohol use: Not Currently    Alcohol/week: 0.0 standard drinks  . Drug use: No      Family Hx: The patient's family history includes AAA (abdominal aortic aneurysm) in her daughter; Anxiety disorder in her daughter; Arthritis in her daughter; Asthma in her brother; Cataracts in her mother; Depression in her daughter and mother; Diabetes in her daughter; Heart disease in her mother; Hyperlipidemia  in her daughter and daughter; Hypertension in her daughter and mother; Plantar fasciitis in her daughter; Stroke in her mother.  ROS:   Please see the history of present illness.    Review of Systems  Constitutional: Negative.   HENT: Negative.   Respiratory: Negative.   Cardiovascular: Negative.   Gastrointestinal: Negative.   Musculoskeletal: Negative.        Gait instability  Neurological: Negative.   Psychiatric/Behavioral: Negative.   All other systems reviewed and are negative.    Labs/Other Tests and Data Reviewed:    Recent Labs: 12/28/2018: ALT 14; TSH 1.220 10/23/2019: BUN 25; Creatinine, Ser 0.98; Hemoglobin 12.2; Platelets 182; Potassium 3.9; Sodium 138   Recent Lipid Panel Lab Results  Component Value Date/Time   CHOL 123 12/28/2018 04:26 PM   TRIG 115 12/28/2018 04:26 PM   HDL 64 12/28/2018 04:26 PM   CHOLHDL 1.9 12/28/2018 04:26 PM   CHOLHDL 2.1 12/16/2016 03:39 PM   LDLCALC 39 12/28/2018 04:26 PM   LDLCALC 56 12/16/2016 03:39 PM    Wt Readings from Last 3 Encounters:  10/30/19 160 lb 8 oz (72.8 kg)  10/17/19 156 lb (70.8 kg)  10/11/19 159 lb 9.6 oz (72.4 kg)     Exam:    Vital Signs: Vital signs may also be detailed in the HPI BP 120/60 (BP Location: Left Arm, Patient  Position: Sitting, Cuff Size: Normal)   Pulse 91   Ht 5' (1.524 m)   Wt 160 lb 8 oz (72.8 kg)   SpO2 98%   BMI 31.35 kg/m   Constitutional:  oriented to person, place, and time. No distress.  In wheelchair HENT:  Head: Grossly normal Eyes:  no discharge. No scleral icterus.  Neck: No JVD, no carotid bruits  Cardiovascular: Regular rate and rhythm, no murmurs appreciated Pulmonary/Chest: Clear to auscultation bilaterally, no wheezes or rails Abdominal: Soft.  no distension.  no tenderness.  Musculoskeletal: Normal range of motion Neurological:  normal muscle tone. Coordination normal. 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  She would clearly benefit as she is relatively immobile She would be acceptable risk, denies any unstable angina symptoms Blood pressure stable, cholesterol at goal Appears euvolemic No EKG changes  HTN: Blood pressure is well controlled on today's visit. No changes made to the medications.  Cad stable angina Prior stress test with no ischemia, no changes clinically since that time Stressed importance that she continue to work on her diabetes  Diabetes, HBA1C 8.4, Unable to exercise for weight loss, recommended strict diet  Gait  Instability Plan for left hip surgery in the next several weeks per the patient with Dr. Rudene Christians Likely have  benefit going forward as she is relatively immobile currently   Total encounter time  more than 25 minutes  Greater than 50% was spent in counseling and coordination of care with the patient   Disposition: Follow-up in 6 months  Signed, Ida Rogue, MD  [facility] [id number], [place], [place] [address]

## 2019-10-30 ENCOUNTER — Other Ambulatory Visit: Payer: Self-pay

## 2019-10-30 ENCOUNTER — Encounter: Payer: Self-pay | Admitting: Cardiovascular Disease

## 2019-10-30 ENCOUNTER — Other Ambulatory Visit: Payer: Self-pay | Admitting: Physician Assistant

## 2019-10-30 ENCOUNTER — Ambulatory Visit: Payer: Medicare Other | Admitting: Cardiovascular Disease

## 2019-10-30 VITALS — BP 120/60 | HR 91 | Ht 60.0 in | Wt 160.5 lb

## 2019-10-30 DIAGNOSIS — I739 Peripheral vascular disease, unspecified: Secondary | ICD-10-CM

## 2019-10-30 DIAGNOSIS — I5022 Chronic systolic (congestive) heart failure: Secondary | ICD-10-CM

## 2019-10-30 DIAGNOSIS — I25118 Atherosclerotic heart disease of native coronary artery with other forms of angina pectoris: Secondary | ICD-10-CM | POA: Diagnosis not present

## 2019-10-30 DIAGNOSIS — E1165 Type 2 diabetes mellitus with hyperglycemia: Secondary | ICD-10-CM

## 2019-10-30 DIAGNOSIS — E785 Hyperlipidemia, unspecified: Secondary | ICD-10-CM

## 2019-10-30 DIAGNOSIS — J410 Simple chronic bronchitis: Secondary | ICD-10-CM

## 2019-10-30 DIAGNOSIS — I10 Essential (primary) hypertension: Secondary | ICD-10-CM

## 2019-10-30 NOTE — Patient Instructions (Addendum)
Medication Instructions:  No changes  If you need a refill on your cardiac medications before your next appointment, please call your pharmacy.    Lab work: No new labs needed   If you have labs (blood work) drawn today and your tests are completely normal, you will receive your results only by: . MyChart Message (if you have MyChart) OR . A paper copy in the mail If you have any lab test that is abnormal or we need to change your treatment, we will call you to review the results.   Testing/Procedures: No new testing needed   Follow-Up: At [facility], you and your health needs are our priority.  As part of our continuing mission to provide you with exceptional heart care, we have created designated Provider Care Teams.  These Care Teams include your primary Cardiologist (physician) and Advanced Practice Providers (APPs -  Physician Assistants and Nurse Practitioners) who all work together to provide you with the care you need, when you need it.  . You will need a follow up appointment in 12 months  . Providers on your designated Care Team:   . Christopher Berge, NP . Ryan Dunn, PA-C . Jacquelyn Visser, PA-C  Any Other Special Instructions Will Be Listed Below (If Applicable).  COVID-19 Vaccine Information can be found at: [email or web address] For questions related to vaccine distribution or appointments, please email [email or web address] or call [phone].     

## 2019-10-31 NOTE — Telephone Encounter (Signed)
Okay to proceed with surgery, she was seen in clinic by myself

## 2019-10-31 NOTE — Telephone Encounter (Signed)
   Primary Cardiologist: Ida Rogue, MD  Chart reviewed as part of pre-operative protocol coverage. Given past medical history and time since last visit, based on ACC/AHA guidelines, Andrea [name] would be at acceptable risk for the planned procedure without further cardiovascular testing.   We would prefer the patient remain on aspirin 81 mg but OK to hold 5 days pre op if needed.  I will route this recommendation to the requesting party via Epic fax function and remove from pre-op pool.  Please call with questions.  Kerin Ransom, PA-C 10/31/2019, 3:37 PM

## 2019-11-07 DIAGNOSIS — G039 Meningitis, unspecified: Secondary | ICD-10-CM

## 2019-11-07 DIAGNOSIS — A419 Sepsis, unspecified organism: Secondary | ICD-10-CM

## 2019-11-07 DIAGNOSIS — G9600 Cerebrospinal fluid leak, unspecified: Secondary | ICD-10-CM

## 2019-11-07 HISTORY — DX: Meningitis, unspecified: G03.9

## 2019-11-07 HISTORY — DX: Sepsis, unspecified organism: A41.9

## 2019-11-07 HISTORY — DX: Cerebrospinal fluid leak, unspecified: G96.00

## 2019-11-14 ENCOUNTER — Other Ambulatory Visit: Payer: Self-pay | Admitting: Orthopedic Surgery

## 2019-11-14 ENCOUNTER — Encounter
Admission: RE | Admit: 2019-11-14 | Discharge: 2019-11-14 | Disposition: A | Discharge status: Home / Self Care | Payer: Medicare Other | Source: Ambulatory Visit | Attending: Orthopedic Surgery | Admitting: Orthopedic Surgery

## 2019-11-14 NOTE — Pre-Procedure Instructions (Signed)
Minna Merritts, MD  Physician  Cardiology  Progress Notes     Signed  Encounter Date:  10/30/2019          Signed      Expand All Collapse All  Show:Clear all [x] Manual[x] Template[x] Copied  Added by: [x] Minna Merritts, MD  [] Hover for details       Date:  10/30/2019   ID:  [name], [name] 1946-02-13, MRN [id number]  Patient Location:  [address] [place] [address]   Provider location:   [facility], [place] office  PCP:  Mar Daring, PA-C       Cardiologist:  Arvid Right [facility]       Chief Complaint  Patient presents with   other    Pre op clearance for left hip surgery; removal of exotosis. Meds reviewed by the pt. verbally. "doing well."      History of Present Illness:    Andrea [name] is a 74 y.o. female  past medical history of obesity,  Diabetestype II,hemoglobin A1c 8.1 Prior stroke/TIA Prior smoking, stopped 4 to 5 yrs ago hyperlipidemia,  right bundle branch block,  CAD myocardial infarction (in 1994-95) ,mid anterior wall to apical region, anteroseptal, apical and apical inferior region systolic heart failure with ejection fraction of 45-50% in May 2014 Normal ejection fraction March 2018 Echo 2018: EF 55% Who presents for follow-up of her coronary artery disease,history of previous MI  Last office visit February 2019 Video visit with our office 04/2019  seen by vascular 03/22/2019.  Had lower extremity arterial Dopplers  Bilateral lower extremity duplex showing biphasic waveforms. RLE had biphasic/triphasic waveforms.Noted mild PAD  wheelchair bound since her hip surgery (prior fall leading to hip fracture ) having problems with the hardware per the patient, needs to have left hip surgery in the near future  Has a walker, presents today in a wheelchair No chest pain, no SOB Does not feel she is having any angina No edema  Prior stress test 2018 No  ischemia  Echocardiogram 2018, normal ejection fraction 55 to 60%  Hemoglobin A1c has improved over the last several years  EKG personally reviewed by myself on todays visit NSR, old anterior mi LAD  Previous lab work  Total cholesterol 136, LDL 43    Prior CV studies:   The following studies were reviewed today:        Past Medical History:  Diagnosis Date   Bronchitis    Chickenpox    Coronary artery disease    Depression    Diabetes mellitus type 2, uncomplicated (HCC)    Diabetes mellitus without complication ([place])    Diabetic retinopathy ([place])    Dupuytren contracture    Frequent urinary tract infections    GERD (gastroesophageal reflux disease)    History of hand surgery 2011   left hand   Hyperlipidemia, unspecified    Irritable bowel syndrome    MI (myocardial infarction) ([place]) 1994   Neuromuscular disorder ([place])    nerve pain in back and legs   Osteoporosis    Pneumonia 2014   Stroke ([place])    TIA (transient ischemic attack)    Vitamin D deficiency, unspecified    Wheezing         Past Surgical History:  Procedure Laterality Date   CARDIAC CATHETERIZATION  1994   COLONOSCOPY     x3   COLONOSCOPY WITH PROPOFOL N/A 02/08/2019   Procedure: COLONOSCOPY WITH PROPOFOL;  Surgeon: [name], MD;  Location: [facility] ENDOSCOPY;  Service: Gastroenterology;  Laterality: N/A;   CORONARY ANGIOPLASTY  1994   EYE SURGERY Bilateral    cataracts   HAND SURGERY Left 2011   left hand release of contractures   HIP FRACTURE SURGERY  11/2013   PR COLSC FLX W/REMOVAL LESION BY HOT BX FORCEPS   08/21/2015   Procedure: COLONOSCOPY, FLEXIBLE, PROXIMAL TO SPLENIC FLEXURE; W/REMOVAL TUMOR/POLYP/OTHER LESION, HOT BX FORCEP/CAUTE; Surgeon: Carlena Hurl, MD; Location: [facility]; Service: General Surgery     Active Medications      Current Meds  Medication Sig   Alcohol Swabs (ALCOHOL  PREP) PADS Use twice daily prior to SQ injection of insulin to clean skin   aspirin EC 81 MG tablet Take 81 mg daily by mouth.   atorvastatin (LIPITOR) 80 MG tablet TAKE 1 TABLET BY MOUTH EVERY DAY (Patient taking differently: Take 80 mg by mouth at bedtime. )   Calcium Carb-Cholecalciferol (CVS CALCIUM 600+D) 600-800 MG-UNIT TABS Take 1 tablet by mouth in the morning and at bedtime.    Cyanocobalamin (B-12) 1000 MCG CAPS Take 1,000 mcg by mouth daily.    HYDROcodone-acetaminophen (NORCO) 10-325 MG tablet Take 1 tablet by mouth every 8 (eight) hours as needed.   insulin glargine (LANTUS) 100 UNIT/ML injection INJECT 65 UNITS INTO THE SKIN TWICE DAILY WITH A MEAL   INSULIN SYRINGE 1CC/29G 29G X 1/2" 1 ML MISC For lantus injections twice daily   losartan (COZAAR) 25 MG tablet TAKE 1 TABLET BY MOUTH EVERY DAY (Patient taking differently: Take 25 mg by mouth daily. )   metoprolol succinate (TOPROL-XL) 25 MG 24 hr tablet Take 1 tablet (25 mg total) by mouth daily.   Multiple Vitamins-Minerals (ICAPS AREDS 2 PO) Take 1 capsule by mouth 2 (two) times daily.    nystatin (MYCOSTATIN/NYSTOP) powder Apply 1 application topically 3 (three) times daily.   Omega-3 Fatty Acids (FISH OIL) 1200 MG CAPS Take 1 capsule (1,200 mg total) by mouth daily. (Patient taking differently: Take 1,200 mg by mouth at bedtime. )   omeprazole (PRILOSEC) 40 MG capsule TAKE 1 CAPSULE BY MOUTH EVERY DAY (Patient taking differently: Take 40 mg by mouth daily. )   OneTouch Delica Lancets 32I MISC To check blood sugar daily  DX: E11.9   ONETOUCH VERIO test strip TO CHECK BLOOD SUGAR ONCE DAILY.   oxybutynin (DITROPAN-XL) 10 MG 24 hr tablet TAKE 1 TABLET BY MOUTH EVERYDAY AT BEDTIME (Patient taking differently: Take 10 mg by mouth daily. )   sertraline (ZOLOFT) 25 MG tablet Take 25 mg by mouth daily as needed (Anxiety).    TRULICITY 1.5 ZT/2.4PY SOPN INJECT 1.5MG  INTO THE SKIN ONCE A WEEK AS DIRECTED (Patient taking  differently: Inject 1.5 mg into the skin every Wednesday. )   Vitamin D, Ergocalciferol, (DRISDOL) 1.25 MG (50000 UNIT) CAPS capsule TAKE ONE CAPSULE BY MOUTH ONCE WEEKLY ON THE SAME DAY EACH WEEK (ON FRIDAYS) (Patient taking differently: Take 50,000 Units by mouth every 7 (seven) days. Friday)       Allergies:   Metformin and related and Sulfa antibiotics   Social History        Tobacco Use   Smoking status: Former Smoker    Packs/day: 1.00    Years: 30.00    Pack years: 30.00    Types: Cigarettes    Quit date: 07/17/2012    Years since quitting: 7.2   Smokeless tobacco: Never Used   Tobacco comment:    Vaping Use  Vaping Use: Never used  Substance Use Topics   Alcohol use: Not Currently    Alcohol/week: 0.0 standard drinks   Drug use: No      Family Hx: The patient's family history includes AAA (abdominal aortic aneurysm) in her daughter; Anxiety disorder in her daughter; Arthritis in her daughter; Asthma in her brother; Cataracts in her mother; Depression in her daughter and mother; Diabetes in her daughter; Heart disease in her mother; Hyperlipidemia in her daughter and daughter; Hypertension in her daughter and mother; Plantar fasciitis in her daughter; Stroke in her mother.  ROS:   Please see the history of present illness.    Review of Systems  Constitutional: Negative.   HENT: Negative.   Respiratory: Negative.   Cardiovascular: Negative.   Gastrointestinal: Negative.   Musculoskeletal: Negative.        Gait instability  Neurological: Negative.   Psychiatric/Behavioral: Negative.   All other systems reviewed and are negative.    Labs/Other Tests and Data Reviewed:    Recent Labs: 12/28/2018: ALT 14; TSH 1.220 10/23/2019: BUN 25; Creatinine, Ser 0.98; Hemoglobin 12.2; Platelets 182; Potassium 3.9; Sodium 138   Recent Lipid Panel Labs (Brief)       Lab Results  Component Value Date/Time   CHOL 123 12/28/2018 04:26 PM   TRIG  115 12/28/2018 04:26 PM   HDL 64 12/28/2018 04:26 PM   CHOLHDL 1.9 12/28/2018 04:26 PM   CHOLHDL 2.1 12/16/2016 03:39 PM   LDLCALC 39 12/28/2018 04:26 PM   LDLCALC 56 12/16/2016 03:39 PM         Wt Readings from Last 3 Encounters:  10/30/19 160 lb 8 oz (72.8 kg)  10/17/19 156 lb (70.8 kg)  10/11/19 159 lb 9.6 oz (72.4 kg)     Exam:    Vital Signs: Vital signs may also be detailed in the HPI BP 120/60 (BP Location: Left Arm, Patient Position: Sitting, Cuff Size: Normal)    Pulse 91    Ht 5' (1.524 m)    Wt 160 lb 8 oz (72.8 kg)    SpO2 98%    BMI 31.35 kg/m   Constitutional:  oriented to person, place, and time. No distress.  In wheelchair HENT:  Head: Grossly normal Eyes:  no discharge. No scleral icterus.  Neck: No JVD, no carotid bruits  Cardiovascular: Regular rate and rhythm, no murmurs appreciated Pulmonary/Chest: Clear to auscultation bilaterally, no wheezes or rails Abdominal: Soft.  no distension.  no tenderness.  Musculoskeletal: Normal range of motion Neurological:  normal muscle tone. Coordination normal. 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  She would clearly  benefit as she is relatively immobile She would be acceptable risk, denies any unstable angina symptoms Blood pressure stable,  cholesterol at goal Appears euvolemic No EKG changes  HTN: Blood pressure is well controlled on today's visit. No changes made to the medications.  Cad stable angina Prior stress test with no ischemia, no changes clinically since that time Stressed importance that she continue to work on her diabetes  Diabetes, HBA1C 8.4, Unable to exercise for weight loss, recommended strict diet  Gait  Instability Plan for left hip surgery in the next several weeks per the patient with Dr. Rudene Christians Likely have benefit going forward as she is relatively immobile currently   Total encounter time more than 25 minutes  Greater than 50% was spent in counseling and coordination of care with the patient   Disposition: Follow-up in 6 months  Signed, Ida Rogue, MD  [facility] [id number], [place], [place] [address]            Electronically signed by Minna Merritts, MD at 10/30/2019 1:23 PM  Office Visit on 10/30/2019   Office Visit on 10/30/2019     Detailed Report    Note shared with patient

## 2019-11-14 NOTE — Patient Instructions (Addendum)
Your procedure is scheduled on: 11-20-19 TUESDAY Report to Same Day Surgery 2nd floor medical mall [facility] Entrance-take elevator on left to 2nd floor.  Check in with surgery information desk.) To find out your arrival time please call [phone] between 1PM - 3PM on 11-19-19 MONDAY  Remember: Instructions that are not followed completely may result in serious medical risk, up to and including death, or upon the discretion of your surgeon and anesthesiologist your surgery may need to be rescheduled.    _x___ 1. Do not eat food after midnight the night before your procedure. NO GUM OR CANDY AFTER MIDNIGHT. You may drink WATER up to 2 hours before you are scheduled to arrive at the hospital for your procedure.  Do not drink WATER within 2 hours of your scheduled arrival to the hospital.  Type 1 and type 2 diabetics should only drink water.   _X___GATORADE G2-FINISH DRINK 2 HOURS PRIOR TO ARRIVAL TIME TO HOSPITAL THE DAY OF YOUR SURGERY     __x__ 2. No Alcohol for 24 hours before or after surgery.   __x__3. No Smoking or e-cigarettes for 24 prior to surgery.  Do not use any chewable tobacco products for at least 6 hour prior to surgery   ____  4. Bring all medications with you on the day of surgery if instructed.    __x__ 5. Notify your doctor if there is any change in your medical condition     (cold, fever, infections).    x___6. On the morning of surgery brush your teeth with toothpaste and water.  You may rinse your mouth with mouth wash if you wish.  Do not swallow any toothpaste or mouthwash.   Do not wear jewelry, make-up, hairpins, clips or nail polish.  Do not wear lotions, powders, or perfumes.   Do not shave 48 hours prior to surgery. Men may shave face and neck.  Do not bring valuables to the hospital.    [facility] is not responsible for any belongings or valuables.               Contacts, dentures or bridgework may not be worn into surgery.  Leave your suitcase  in the car. After surgery it may be brought to your room.  For patients admitted to the hospital, discharge time is determined by your treatment team.  _  Patients discharged the day of surgery will not be allowed to drive home.  You will need someone to drive you home and stay with you the night of your procedure.    Please read over the following fact sheets that you were given:   [facility] Preparing for Surgery  _x___ TAKE THE FOLLOWING MEDICATION THE MORNING OF SURGERY WITH A SMALL SIP OF WATER. These include:  1. METOPROLOL (TOPROL)  2. DITROPAN (OXYBUTYNIN)  3. PRILOSEC (OMEPRAZOLE)  4. TAKE AN EXTRA OMEPRAZOLE THE NIGHT BEFORE YOUR SURGERY  5. YOU MAY TAKE HYDROCODONE THE MORNING OF SURGERY IF NEEDED  6.  ____Fleets enema or Magnesium Citrate as directed.   _x___ Use CHG Soap  as directed on instruction sheet   ____ Use inhalers on the day of surgery and bring to hospital day of surgery  ____ Stop Metformin and Janumet 2 days prior to surgery.    _X___ Take 1/2 of usual insulin dose the night before surgery and none on the morning surgery-TAKE HALF OF YOUR LANTUS Monday NIGHT   _x___ Follow recommendations from Cardiologist, Pulmonologist or PCP regarding stopping Aspirin,  Coumadin, Plavix ,Eliquis, Effient, or Pradaxa, and Pletal-WAS INSTRUCTED BY CARDIOLOGIST TO CONTINUE 81 MG ASPIRIN-DO NOT TAKE ASPIRIN MORNING OF SURGERY  X____Stop Anti-inflammatories such as Advil, Aleve, Ibuprofen, Motrin, Naproxen, Naprosyn, Goodies powders or aspirin products NOW-OK to take Tylenol OR HYDROCODONE IF NEEDED   _x___ Stop supplements until after surgery-STOP AREDS MULTIVITAMIN, FISH OIL AND BERBERINE NOW-YOU MAY RESUME AFTER YOUR SURGERY   ____ Bring C-Pap to the hospital.

## 2019-11-15 ENCOUNTER — Inpatient Hospital Stay
Admission: EM | Admit: 2019-11-15 | Discharge: 2019-11-24 | DRG: 871 | Disposition: A | Discharge status: Other Acute Inpatient Hospital | Payer: Medicare Other | Attending: Internal Medicine | Admitting: Internal Medicine

## 2019-11-15 ENCOUNTER — Other Ambulatory Visit: Payer: Self-pay

## 2019-11-15 ENCOUNTER — Encounter (HOSPITAL_COMMUNITY): Payer: Self-pay | Admitting: Urgent Care

## 2019-11-15 ENCOUNTER — Emergency Department: Payer: Medicare Other

## 2019-11-15 DIAGNOSIS — Z79899 Other long term (current) drug therapy: Secondary | ICD-10-CM

## 2019-11-15 DIAGNOSIS — Z882 Allergy status to sulfonamides status: Secondary | ICD-10-CM | POA: Diagnosis not present

## 2019-11-15 DIAGNOSIS — A403 Sepsis due to Streptococcus pneumoniae: Principal | ICD-10-CM | POA: Diagnosis present

## 2019-11-15 DIAGNOSIS — I5022 Chronic systolic (congestive) heart failure: Secondary | ICD-10-CM | POA: Diagnosis not present

## 2019-11-15 DIAGNOSIS — Z87440 Personal history of urinary (tract) infections: Secondary | ICD-10-CM

## 2019-11-15 DIAGNOSIS — A409 Streptococcal sepsis, unspecified: Secondary | ICD-10-CM | POA: Diagnosis not present

## 2019-11-15 DIAGNOSIS — K219 Gastro-esophageal reflux disease without esophagitis: Secondary | ICD-10-CM | POA: Diagnosis not present

## 2019-11-15 DIAGNOSIS — J3489 Other specified disorders of nose and nasal sinuses: Secondary | ICD-10-CM | POA: Diagnosis not present

## 2019-11-15 DIAGNOSIS — K589 Irritable bowel syndrome without diarrhea: Secondary | ICD-10-CM | POA: Diagnosis present

## 2019-11-15 DIAGNOSIS — Z794 Long term (current) use of insulin: Secondary | ICD-10-CM

## 2019-11-15 DIAGNOSIS — Z8673 Personal history of transient ischemic attack (TIA), and cerebral infarction without residual deficits: Secondary | ICD-10-CM

## 2019-11-15 DIAGNOSIS — J410 Simple chronic bronchitis: Secondary | ICD-10-CM

## 2019-11-15 DIAGNOSIS — R7881 Bacteremia: Secondary | ICD-10-CM | POA: Diagnosis not present

## 2019-11-15 DIAGNOSIS — E785 Hyperlipidemia, unspecified: Secondary | ICD-10-CM | POA: Diagnosis present

## 2019-11-15 DIAGNOSIS — Z8701 Personal history of pneumonia (recurrent): Secondary | ICD-10-CM

## 2019-11-15 DIAGNOSIS — Z818 Family history of other mental and behavioral disorders: Secondary | ICD-10-CM

## 2019-11-15 DIAGNOSIS — F329 Major depressive disorder, single episode, unspecified: Secondary | ICD-10-CM | POA: Diagnosis present

## 2019-11-15 DIAGNOSIS — Z7982 Long term (current) use of aspirin: Secondary | ICD-10-CM

## 2019-11-15 DIAGNOSIS — Z833 Family history of diabetes mellitus: Secondary | ICD-10-CM

## 2019-11-15 DIAGNOSIS — J440 Chronic obstructive pulmonary disease with acute lower respiratory infection: Secondary | ICD-10-CM | POA: Diagnosis not present

## 2019-11-15 DIAGNOSIS — G9340 Encephalopathy, unspecified: Secondary | ICD-10-CM

## 2019-11-15 DIAGNOSIS — Z87891 Personal history of nicotine dependence: Secondary | ICD-10-CM

## 2019-11-15 DIAGNOSIS — G9600 Cerebrospinal fluid leak, unspecified: Secondary | ICD-10-CM

## 2019-11-15 DIAGNOSIS — A419 Sepsis, unspecified organism: Secondary | ICD-10-CM | POA: Diagnosis present

## 2019-11-15 DIAGNOSIS — Z8249 Family history of ischemic heart disease and other diseases of the circulatory system: Secondary | ICD-10-CM

## 2019-11-15 DIAGNOSIS — R410 Disorientation, unspecified: Secondary | ICD-10-CM | POA: Diagnosis not present

## 2019-11-15 DIAGNOSIS — E1169 Type 2 diabetes mellitus with other specified complication: Secondary | ICD-10-CM

## 2019-11-15 DIAGNOSIS — I252 Old myocardial infarction: Secondary | ICD-10-CM | POA: Diagnosis not present

## 2019-11-15 DIAGNOSIS — E1165 Type 2 diabetes mellitus with hyperglycemia: Secondary | ICD-10-CM | POA: Diagnosis present

## 2019-11-15 DIAGNOSIS — R519 Headache, unspecified: Secondary | ICD-10-CM | POA: Diagnosis not present

## 2019-11-15 DIAGNOSIS — B953 Streptococcus pneumoniae as the cause of diseases classified elsewhere: Secondary | ICD-10-CM | POA: Diagnosis not present

## 2019-11-15 DIAGNOSIS — R4781 Slurred speech: Secondary | ICD-10-CM | POA: Diagnosis present

## 2019-11-15 DIAGNOSIS — I517 Cardiomegaly: Secondary | ICD-10-CM | POA: Diagnosis not present

## 2019-11-15 DIAGNOSIS — R6520 Severe sepsis without septic shock: Secondary | ICD-10-CM | POA: Diagnosis not present

## 2019-11-15 DIAGNOSIS — E113293 Type 2 diabetes mellitus with mild nonproliferative diabetic retinopathy without macular edema, bilateral: Secondary | ICD-10-CM | POA: Diagnosis not present

## 2019-11-15 DIAGNOSIS — I2510 Atherosclerotic heart disease of native coronary artery without angina pectoris: Secondary | ICD-10-CM | POA: Diagnosis present

## 2019-11-15 DIAGNOSIS — J9601 Acute respiratory failure with hypoxia: Secondary | ICD-10-CM | POA: Diagnosis not present

## 2019-11-15 DIAGNOSIS — J9811 Atelectasis: Secondary | ICD-10-CM | POA: Diagnosis not present

## 2019-11-15 DIAGNOSIS — R4182 Altered mental status, unspecified: Secondary | ICD-10-CM | POA: Diagnosis present

## 2019-11-15 DIAGNOSIS — G8929 Other chronic pain: Secondary | ICD-10-CM | POA: Diagnosis present

## 2019-11-15 DIAGNOSIS — Z8661 Personal history of infections of the central nervous system: Secondary | ICD-10-CM

## 2019-11-15 DIAGNOSIS — E113299 Type 2 diabetes mellitus with mild nonproliferative diabetic retinopathy without macular edema, unspecified eye: Secondary | ICD-10-CM | POA: Diagnosis present

## 2019-11-15 DIAGNOSIS — I110 Hypertensive heart disease with heart failure: Secondary | ICD-10-CM | POA: Diagnosis present

## 2019-11-15 DIAGNOSIS — M48061 Spinal stenosis, lumbar region without neurogenic claudication: Secondary | ICD-10-CM | POA: Diagnosis present

## 2019-11-15 DIAGNOSIS — Z20822 Contact with and (suspected) exposure to covid-19: Secondary | ICD-10-CM | POA: Diagnosis present

## 2019-11-15 DIAGNOSIS — G039 Meningitis, unspecified: Secondary | ICD-10-CM | POA: Diagnosis not present

## 2019-11-15 DIAGNOSIS — I25118 Atherosclerotic heart disease of native coronary artery with other forms of angina pectoris: Secondary | ICD-10-CM | POA: Diagnosis not present

## 2019-11-15 DIAGNOSIS — J449 Chronic obstructive pulmonary disease, unspecified: Secondary | ICD-10-CM | POA: Diagnosis present

## 2019-11-15 DIAGNOSIS — M810 Age-related osteoporosis without current pathological fracture: Secondary | ICD-10-CM | POA: Diagnosis present

## 2019-11-15 DIAGNOSIS — R0902 Hypoxemia: Secondary | ICD-10-CM

## 2019-11-15 DIAGNOSIS — G9601 Cranial cerebrospinal fluid leak, spontaneous: Secondary | ICD-10-CM | POA: Diagnosis not present

## 2019-11-15 DIAGNOSIS — R0600 Dyspnea, unspecified: Secondary | ICD-10-CM

## 2019-11-15 DIAGNOSIS — Z5320 Procedure and treatment not carried out because of patient's decision for unspecified reasons: Secondary | ICD-10-CM | POA: Diagnosis present

## 2019-11-15 DIAGNOSIS — E559 Vitamin D deficiency, unspecified: Secondary | ICD-10-CM | POA: Diagnosis present

## 2019-11-15 DIAGNOSIS — J189 Pneumonia, unspecified organism: Secondary | ICD-10-CM | POA: Diagnosis not present

## 2019-11-15 DIAGNOSIS — Z825 Family history of asthma and other chronic lower respiratory diseases: Secondary | ICD-10-CM

## 2019-11-15 DIAGNOSIS — Z823 Family history of stroke: Secondary | ICD-10-CM

## 2019-11-15 DIAGNOSIS — R000 Tachycardia, unspecified: Secondary | ICD-10-CM | POA: Diagnosis not present

## 2019-11-15 DIAGNOSIS — Z888 Allergy status to other drugs, medicaments and biological substances status: Secondary | ICD-10-CM

## 2019-11-15 DIAGNOSIS — G9341 Metabolic encephalopathy: Secondary | ICD-10-CM | POA: Diagnosis present

## 2019-11-15 DIAGNOSIS — R0602 Shortness of breath: Secondary | ICD-10-CM | POA: Diagnosis not present

## 2019-11-15 LAB — COMPREHENSIVE METABOLIC PANEL
ALT: 17 U/L (ref 0–44)
AST: 15 U/L (ref 15–41)
Albumin: 4.1 g/dL (ref 3.5–5.0)
Alkaline Phosphatase: 102 U/L (ref 38–126)
Anion gap: 13 (ref 5–15)
BUN: 21 mg/dL (ref 8–23)
CO2: 22 mmol/L (ref 22–32)
Calcium: 9.6 mg/dL (ref 8.9–10.3)
Chloride: 98 mmol/L (ref 98–111)
Creatinine, Ser: 0.98 mg/dL (ref 0.44–1.00)
GFR calc Af Amer: >60 mL/min (ref >60)
GFR calc non Af Amer: 57 mL/min — ABNORMAL LOW (ref >60)
Glucose, Bld: 269 mg/dL — ABNORMAL HIGH (ref 70–99)
Potassium: 4.2 mmol/L (ref 3.5–5.1)
Sodium: 133 mmol/L — ABNORMAL LOW (ref 135–145)
Total Bilirubin: 2.1 mg/dL — ABNORMAL HIGH (ref 0.3–1.2)
Total Protein: 7.4 g/dL (ref 6.5–8.1)

## 2019-11-15 LAB — CBC
HCT: 39.7 % (ref 36.0–46.0)
Hemoglobin: 13.5 g/dL (ref 12.0–15.0)
MCH: 31 pg (ref 26.0–34.0)
MCHC: 34 g/dL (ref 30.0–36.0)
MCV: 91.3 fL (ref 80.0–100.0)
Platelets: 161 10*3/uL (ref 150–400)
RBC: 4.35 MIL/uL (ref 3.87–5.11)
RDW: 13.4 % (ref 11.5–15.5)
WBC: 21.5 10*3/uL — ABNORMAL HIGH (ref 4.0–10.5)
nRBC: 0 % (ref 0.0–0.2)

## 2019-11-15 LAB — PROTIME-INR
INR: 1 (ref 0.8–1.2)
Prothrombin Time: 12.8 seconds (ref 11.4–15.2)

## 2019-11-15 LAB — APTT: aPTT: 41 seconds — ABNORMAL HIGH (ref 24–36)

## 2019-11-15 LAB — URINALYSIS, COMPLETE (UACMP) WITH MICROSCOPIC
Bilirubin Urine: NEGATIVE
Glucose, UA: >=500 mg/dL — AB
Hgb urine dipstick: NEGATIVE
Ketones, ur: 5 mg/dL — AB
Nitrite: NEGATIVE
Protein, ur: >=300 mg/dL — AB
Specific Gravity, Urine: 1.013 (ref 1.005–1.030)
pH: 5 (ref 5.0–8.0)

## 2019-11-15 LAB — LACTIC ACID, PLASMA
Lactic Acid, Venous: 1.9 mmol/L (ref 0.5–1.9)
Lactic Acid, Venous: 1.9 mmol/L (ref 0.5–1.9)

## 2019-11-15 LAB — SARS CORONAVIRUS 2 BY RT PCR (HOSPITAL ORDER, PERFORMED IN ~~LOC~~ HOSPITAL LAB): SARS Coronavirus 2: NEGATIVE

## 2019-11-15 MED ORDER — DEXAMETHASONE SODIUM PHOSPHATE 10 MG/ML IJ SOLN
10.0000 mg | Freq: Once | INTRAMUSCULAR | Status: AC
  Administered 2019-11-15: 10 mg via INTRAVENOUS
  Filled 2019-11-15: qty 1

## 2019-11-15 MED ORDER — VANCOMYCIN HCL IN DEXTROSE 1-5 GM/200ML-% IV SOLN
1000.0000 mg | Freq: Once | INTRAVENOUS | Status: DC
  Administered 2019-11-16: 1000 mg via INTRAVENOUS
  Filled 2019-11-15: qty 200

## 2019-11-15 MED ORDER — ACETAMINOPHEN 325 MG PO TABS
650.0000 mg | ORAL_TABLET | Freq: Once | ORAL | Status: AC
  Administered 2019-11-15: 650 mg via ORAL
  Filled 2019-11-15: qty 2

## 2019-11-15 MED ORDER — LOSARTAN POTASSIUM 25 MG PO TABS
25.0000 mg | ORAL_TABLET | ORAL | Status: DC
  Administered 2019-11-16 – 2019-11-24 (×8): 25 mg via ORAL
  Filled 2019-11-15 (×11): qty 1

## 2019-11-15 MED ORDER — ATORVASTATIN CALCIUM 20 MG PO TABS
80.0000 mg | ORAL_TABLET | Freq: Every day | ORAL | Status: DC
  Administered 2019-11-16 – 2019-11-23 (×8): 80 mg via ORAL
  Filled 2019-11-15 (×9): qty 4

## 2019-11-15 MED ORDER — INSULIN GLARGINE 100 UNIT/ML ~~LOC~~ SOLN
65.0000 [IU] | Freq: Every day | SUBCUTANEOUS | Status: DC
  Filled 2019-11-15: qty 0.65

## 2019-11-15 MED ORDER — MORPHINE SULFATE (PF) 2 MG/ML IV SOLN
2.0000 mg | INTRAVENOUS | Status: DC | PRN
  Administered 2019-11-20: 2 mg via INTRAVENOUS
  Filled 2019-11-15: qty 1

## 2019-11-15 MED ORDER — ASPIRIN EC 81 MG PO TBEC
81.0000 mg | DELAYED_RELEASE_TABLET | Freq: Every day | ORAL | Status: DC
  Administered 2019-11-16 – 2019-11-24 (×9): 81 mg via ORAL
  Filled 2019-11-15 (×9): qty 1

## 2019-11-15 MED ORDER — VANCOMYCIN HCL 750 MG/150ML IV SOLN: 750.0000 mg | Freq: Two times a day (BID) | INTRAVENOUS | Status: DC

## 2019-11-15 MED ORDER — METOPROLOL SUCCINATE ER 25 MG PO TB24
25.0000 mg | ORAL_TABLET | ORAL | Status: DC
  Administered 2019-11-16 – 2019-11-24 (×9): 25 mg via ORAL
  Filled 2019-11-15 (×9): qty 1

## 2019-11-15 MED ORDER — PANTOPRAZOLE SODIUM 40 MG PO TBEC
40.0000 mg | DELAYED_RELEASE_TABLET | Freq: Every day | ORAL | Status: DC
  Administered 2019-11-16 – 2019-11-24 (×8): 40 mg via ORAL
  Filled 2019-11-15 (×9): qty 1

## 2019-11-15 MED ORDER — SODIUM CHLORIDE 0.9 % IV SOLN
2.0000 g | Freq: Once | INTRAVENOUS | Status: AC
  Administered 2019-11-16: 2 g via INTRAVENOUS
  Filled 2019-11-15: qty 2

## 2019-11-15 MED ORDER — ONDANSETRON HCL 4 MG/2ML IJ SOLN
4.0000 mg | Freq: Four times a day (QID) | INTRAMUSCULAR | Status: DC | PRN
  Administered 2019-11-20: 4 mg via INTRAVENOUS

## 2019-11-15 MED ORDER — LIDOCAINE-PRILOCAINE 2.5-2.5 % EX CREA
TOPICAL_CREAM | Freq: Once | CUTANEOUS | Status: DC
  Filled 2019-11-15: qty 5

## 2019-11-15 MED ORDER — SODIUM CHLORIDE 0.9 % IV SOLN
2.0000 g | Freq: Once | INTRAVENOUS | Status: AC
  Administered 2019-11-15: 2 g via INTRAVENOUS
  Filled 2019-11-15: qty 20

## 2019-11-15 MED ORDER — SODIUM CHLORIDE 0.9 % IV SOLN
2.0000 g | INTRAVENOUS | Status: DC
  Filled 2019-11-15 (×5): qty 2000

## 2019-11-15 MED ORDER — DEXTROSE 5 % IV SOLN
700.0000 mg | Freq: Two times a day (BID) | INTRAVENOUS | Status: DC
  Administered 2019-11-15: 700 mg via INTRAVENOUS
  Filled 2019-11-15 (×3): qty 14

## 2019-11-15 MED ORDER — ACETAMINOPHEN 650 MG RE SUPP
650.0000 mg | Freq: Four times a day (QID) | RECTAL | Status: DC | PRN
  Filled 2019-11-15: qty 1

## 2019-11-15 MED ORDER — SODIUM CHLORIDE 0.9 % IV SOLN
2.0000 g | Freq: Four times a day (QID) | INTRAVENOUS | Status: DC
  Administered 2019-11-15 – 2019-11-16 (×3): 2 g via INTRAVENOUS
  Filled 2019-11-15 (×7): qty 2000

## 2019-11-15 MED ORDER — METRONIDAZOLE IN NACL 5-0.79 MG/ML-% IV SOLN
500.0000 mg | Freq: Three times a day (TID) | INTRAVENOUS | Status: DC
  Administered 2019-11-16 (×2): 500 mg via INTRAVENOUS
  Filled 2019-11-15 (×2): qty 100

## 2019-11-15 MED ORDER — OXYBUTYNIN CHLORIDE ER 5 MG PO TB24
10.0000 mg | ORAL_TABLET | ORAL | Status: DC
  Administered 2019-11-16 – 2019-11-24 (×9): 10 mg via ORAL
  Filled 2019-11-15: qty 1
  Filled 2019-11-15 (×8): qty 2

## 2019-11-15 MED ORDER — LACTATED RINGERS IV BOLUS (SEPSIS): 1000.0000 mL | Freq: Once | INTRAVENOUS | Status: DC

## 2019-11-15 MED ORDER — LACTATED RINGERS IV BOLUS
1000.0000 mL | Freq: Once | INTRAVENOUS | Status: AC
  Administered 2019-11-15: 1000 mL via INTRAVENOUS

## 2019-11-15 MED ORDER — ACETAMINOPHEN 325 MG PO TABS
650.0000 mg | ORAL_TABLET | Freq: Four times a day (QID) | ORAL | Status: DC | PRN
  Administered 2019-11-16 – 2019-11-23 (×9): 650 mg via ORAL
  Filled 2019-11-15 (×10): qty 2

## 2019-11-15 MED ORDER — ENOXAPARIN SODIUM 40 MG/0.4ML ~~LOC~~ SOLN
40.0000 mg | SUBCUTANEOUS | Status: DC
  Administered 2019-11-16 – 2019-11-23 (×8): 40 mg via SUBCUTANEOUS
  Filled 2019-11-15 (×8): qty 0.4

## 2019-11-15 MED ORDER — VITAMIN B-12 1000 MCG PO TABS
1000.0000 ug | ORAL_TABLET | Freq: Every day | ORAL | Status: DC
  Administered 2019-11-16 – 2019-11-24 (×9): 1000 ug via ORAL
  Filled 2019-11-15 (×9): qty 1

## 2019-11-15 MED ORDER — ACETAMINOPHEN 500 MG PO TABS: 1000.0000 mg | ORAL_TABLET | Freq: Once | ORAL | Status: DC

## 2019-11-15 MED ORDER — HYDROCODONE-ACETAMINOPHEN 10-325 MG PO TABS
1.0000 | ORAL_TABLET | Freq: Three times a day (TID) | ORAL | Status: DC | PRN
  Administered 2019-11-17 – 2019-11-24 (×13): 1 via ORAL
  Filled 2019-11-15 (×15): qty 1

## 2019-11-15 MED ORDER — VANCOMYCIN HCL 1750 MG/350ML IV SOLN
1750.0000 mg | Freq: Once | INTRAVENOUS | Status: AC
  Administered 2019-11-15: 1750 mg via INTRAVENOUS
  Filled 2019-11-15: qty 350

## 2019-11-15 MED ORDER — ONDANSETRON HCL 4 MG PO TABS
4.0000 mg | ORAL_TABLET | Freq: Four times a day (QID) | ORAL | Status: DC | PRN
  Filled 2019-11-15: qty 1

## 2019-11-15 NOTE — ED Notes (Signed)
Per Dr Ellender Hose, start antibiotics, one set of cultures drawn, EDP aware, EDP states to start antibiotics and not wait to get second set of cultures

## 2019-11-15 NOTE — Progress Notes (Signed)
Notified bedside nurse of need to administer antibiotics.  

## 2019-11-15 NOTE — Progress Notes (Signed)
Pharmacy Antibiotic Note  Andrea [name] is a 74 y.o. 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  Pharmacy has been consulted for Acyclovir dosing.  Plan: Acyclovir 700 mg IV Q12H ordered to start on 9/9 @ 2200. CrCl = 44.8 ml/min   Height: 5' (152.4 cm) Weight: 72.8 kg (160 lb 8 oz) IBW/kg (Calculated) : 45.5  Temp (24hrs), Avg:99.1 F (37.3 C), Min:97.7 F (36.5 C), Max:100.4 F (38 C)  Recent Labs  Lab 11/15/19 1103 11/15/19 1838  WBC 21.5*  --   CREATININE 0.98  --   LATICACIDVEN  --  1.9    Estimated Creatinine Clearance: 44.8 mL/min (by C-G formula based on SCr of 0.98 mg/dL).    Allergies  Allergen Reactions  . Metformin And Related Diarrhea  . Sulfa Antibiotics Rash    Antimicrobials this admission:   >>    >>   Dose adjustments this admission:   Microbiology results:  BCx:   UCx:    Sputum:    MRSA PCR:   Thank you for allowing pharmacy to be a part of this patient's care.  Rion Catala D 11/15/2019 8:43 PM

## 2019-11-15 NOTE — ED Triage Notes (Signed)
Per pt daughter, pt lives alone but lives close and they check on her daily, states the could not get a hold of her this morning. States she is more confused and c/o HA and rocking back and fourth in the wheelchair on arrival.

## 2019-11-15 NOTE — H&P (Signed)
History and Physical   Andrea [name] GXQ:[id number] DOB: 1945-06-25 DOA: 11/15/2019  Referring MD/NP/PA: Dr. Ellender Hose  PCP: Mar Daring, PA-C   Outpatient Specialists: None  Patient coming from: Home  Chief Complaint: Altered mental status and headache  HPI: Andrea [name] is a 74 y.o. female with medical history significant of coronary artery disease, diabetes type 2, GERD, hyperlipidemia, recurrent UTI and pneumonia as well as depression who was apparently fine yesterday.  She has history of CVA and TIA in the past but no issues lately until today when family found her confused.  She is still confused and moving all around.  She is agitated.  Family reported that this happened suddenly this morning complaint of headache fever and chills.  No known exposure to Covid.  Patient is not having any neck pain or stiffness.  She came to the ER where she was found to be septic but no obvious source.  Lumbar puncture was recommended and ER physician tried with family have refused LP at this point.  They want to try treatment first prior to agreeing to do a lumbar puncture.  At this point the suspicion for sepsis due to a number of reasons.  Due to her altered mental status and headaches meningitis is a big differential either viral or bacterial.  Plan is to admit her and treat empirically while getting neurology consult hopefully in the morning..  ED Course: Temperature 100.4 blood pressure 123/59 pulse 190 respirate 25 and oxygen sats 94% room air.  White count is 21.5 hemoglobin 13.5 and platelets 161.  Sodium is 133 potassium 4.2 chloride 98 CO2 22 BUN 29 creatinine 2.99 calcium 9.6 glucose 269.  Lactic acid is 1.9 PT 12.8 INR 1.0 PTT 41.  COVID-19 is negative.  Blood cultures obtained.  Head CT without contrast showed no acute findings and chest x-ray showed no acute findings.  Urinalysis showed WBC 21-50 and many bacteria.  Is cloudy urine with small ketones.  Protein more than 300 - nitrite  with small leukocyte Estrace.  Review of Systems: As per HPI otherwise 10 point review of systems negative.    Past Medical History:  Diagnosis Date  . Bronchitis   . Chickenpox   . Coronary artery disease   . Depression   . Diabetes mellitus type 2, uncomplicated ([place])   . Diabetes mellitus without complication ([place])   . Diabetic retinopathy ([place])   . Dupuytren contracture   . Frequent urinary tract infections   . GERD (gastroesophageal reflux disease)   . History of hand surgery 2011   left hand  . Hyperlipidemia, unspecified   . Irritable bowel syndrome   . MI (myocardial infarction) ([place]) 1994  . Neuromuscular disorder ([place])    nerve pain in back and legs  . Osteoporosis   . Pneumonia 2014  . Stroke ([place])   . TIA (transient ischemic attack)   . Vitamin D deficiency, unspecified   . Wheezing     Past Surgical History:  Procedure Laterality Date  . CARDIAC CATHETERIZATION  1994  . COLONOSCOPY     x3  . COLONOSCOPY WITH PROPOFOL N/A 02/08/2019   Procedure: COLONOSCOPY WITH PROPOFOL;  Surgeon: [name], MD;  Location: [facility] ENDOSCOPY;  Service: Gastroenterology;  Laterality: N/A;  . CORONARY ANGIOPLASTY  1994  . EYE SURGERY Bilateral    cataracts  . HAND SURGERY Left 2011   left hand release of contractures  . HIP FRACTURE SURGERY  11/2013  . PR COLSC FLX W/REMOVAL  LESION BY HOT BX FORCEPS   08/21/2015   Procedure: COLONOSCOPY, FLEXIBLE, PROXIMAL TO SPLENIC FLEXURE; W/REMOVAL TUMOR/POLYP/OTHER LESION, HOT BX FORCEP/CAUTE; Surgeon: Carlena Hurl, MD; Location: OR [facility]; Service: General Surgery     reports that she quit smoking about 7 years ago. Her smoking use included cigarettes. She has a 30.00 pack-year smoking history. She has never used smokeless tobacco. She reports previous alcohol use. She reports that she does not use drugs.  Allergies  Allergen Reactions  . Metformin And Related Diarrhea  . Sulfa Antibiotics Rash    Family History    Problem Relation Age of Onset  . Depression Daughter   . Arthritis Daughter        spine  . Plantar fasciitis Daughter   . Anxiety disorder Daughter   . Hyperlipidemia Daughter   . AAA (abdominal aortic aneurysm) Daughter   . Hypertension Mother   . Stroke Mother   . Cataracts Mother   . Depression Mother   . Heart disease Mother   . Asthma Brother   . Diabetes Daughter   . Hyperlipidemia Daughter   . Hypertension Daughter      Prior to Admission medications   Medication Sig Start Date End Date Taking? Authorizing Provider  aspirin EC 81 MG tablet Take 81 mg daily by mouth.   Yes [provider]  atorvastatin (LIPITOR) 80 MG tablet TAKE 1 TABLET BY MOUTH EVERY DAY Patient taking differently: Take 80 mg by mouth at bedtime.  09/27/19  Yes Minna Merritts, MD  Calcium Carb-Cholecalciferol ([employer] CALCIUM 600+D) 600-800 MG-UNIT TABS Take 1 tablet by mouth in the morning and at bedtime.    Yes [provider]  Cyanocobalamin (B-12) 1000 MCG CAPS Take 1,000 mcg by mouth daily.    Yes [provider]  insulin glargine (LANTUS) 100 UNIT/ML injection INJECT 65 UNITS INTO THE SKIN TWICE DAILY WITH A MEAL Patient taking differently: Inject 65 Units into the skin daily with supper.  10/15/19  Yes [name] M, PA-C  losartan (COZAAR) 25 MG tablet TAKE 1 TABLET BY MOUTH EVERY DAY Patient taking differently: Take 25 mg by mouth every morning.  05/25/19  Yes Mar Daring, PA-C  metoprolol succinate (TOPROL-XL) 25 MG 24 hr tablet Take 1 tablet (25 mg total) by mouth daily. Patient taking differently: Take 25 mg by mouth every morning.  05/28/19  Yes Burnette, Clearnce Sorrel, PA-C  Multiple Vitamins-Minerals (ICAPS AREDS 2 PO) Take 1 capsule by mouth 2 (two) times daily.    Yes [provider]  Omega-3 Fatty Acids (FISH OIL) 1200 MG CAPS Take 1 capsule (1,200 mg total) by mouth daily. Patient taking differently: Take 1,200 mg by mouth at bedtime.   03/22/17  Yes [name] M, PA-C  omeprazole (PRILOSEC) 40 MG capsule TAKE 1 CAPSULE BY MOUTH EVERY DAY Patient taking differently: Take 40 mg by mouth every morning.  07/31/19  Yes Burnette, Clearnce Sorrel, PA-C  oxybutynin (DITROPAN-XL) 10 MG 24 hr tablet TAKE 1 TABLET BY MOUTH EVERYDAY AT BEDTIME Patient taking differently: Take 10 mg by mouth every morning.  06/22/19  Yes Burnette, Clearnce Sorrel, PA-C  TRULICITY 1.5 IW/5.8KD SOPN INJECT 1.5MG  INTO THE SKIN ONCE A WEEK AS DIRECTED Patient taking differently: Inject 1.5 mg into the skin every Wednesday.  07/28/19  Yes Mar Daring, PA-C  Vitamin D, Ergocalciferol, (DRISDOL) 1.25 MG (50000 UNIT) CAPS capsule TAKE ONE CAPSULE BY MOUTH ONCE WEEKLY ON THE SAME DAY EACH WEEK (ON FRIDAYS) Patient  taking differently: Take 50,000 Units by mouth every 7 (seven) days. Friday 08/07/19  Yes Mar Daring, PA-C  Alcohol Swabs (ALCOHOL PREP) PADS Use twice daily prior to SQ injection of insulin to clean skin 10/02/15   Mar Daring, PA-C  HYDROcodone-acetaminophen (NORCO) 10-325 MG tablet Take 1 tablet by mouth every 8 (eight) hours as needed. 10/15/19   Mar Daring, PA-C  INSULIN SYRINGE 1CC/29G 29G X 1/2" 1 ML MISC For lantus injections twice daily 10/15/19   Mar Daring, PA-C  nystatin (MYCOSTATIN/NYSTOP) powder Apply 1 application topically 3 (three) times daily. Patient not taking: Reported on 11/06/2019 07/05/19   Mar Daring, PA-C  OneTouch Delica Lancets 16X MISC To check blood sugar daily  DX: E11.9 05/01/19   Mar Daring, PA-C  ONETOUCH VERIO test strip TO CHECK BLOOD SUGAR ONCE DAILY. 10/25/19   Mar Daring, PA-C    Physical Exam: Vitals:   11/15/19 2030 11/15/19 2100 11/15/19 2130 11/15/19 2200  BP: (!) 123/59 (!) 126/55 140/63 (!) 153/97  Pulse: 98 99 (!) 109 (!) 109  Resp: (!) 22 (!) 22 (!) 24 (!) 25  Temp:      TempSrc:      SpO2: 94% 97% 96% 95%  Weight:      Height:           Constitutional: Confused, agitated, patient moving around in bed Vitals:   11/15/19 2030 11/15/19 2100 11/15/19 2130 11/15/19 2200  BP: (!) 123/59 (!) 126/55 140/63 (!) 153/97  Pulse: 98 99 (!) 109 (!) 109  Resp: (!) 22 (!) 22 (!) 24 (!) 25  Temp:      TempSrc:      SpO2: 94% 97% 96% 95%  Weight:      Height:       Eyes: PERRL, lids and conjunctivae normal ENMT: Mucous membranes are dry. Posterior pharynx clear of any exudate or lesions.Normal dentition.  Neck: normal, supple, no masses, no thyromegaly Respiratory: Decreased air entry with coarse breath sound no wheezing normal respiratory effort. No accessory muscle use.  Cardiovascular: Sinus tachycardia, no murmurs / rubs / gallops. No extremity edema. 2+ pedal pulses. No carotid bruits.  Abdomen: no tenderness, no masses palpated. No hepatosplenomegaly. Bowel sounds positive.  Musculoskeletal: no clubbing / cyanosis. No joint deformity upper and lower extremities. Good ROM, no contractures. Normal muscle tone.  Skin: no rashes, lesions, ulcers. No induration Neurologic: CN 2-12 grossly intact. Sensation intact, DTR normal. Strength 5/5 in all 4.  Psychiatric: Confused, agitated, moving around in bed, disoriented    Labs on Admission: I have personally reviewed following labs and imaging studies  CBC: Recent Labs  Lab 11/15/19 1103  WBC 21.5*  HGB 13.5  HCT 39.7  MCV 91.3  PLT 096   Basic Metabolic Panel: Recent Labs  Lab 11/15/19 1103  NA 133*  K 4.2  CL 98  CO2 22  GLUCOSE 269*  BUN 21  CREATININE 0.98  CALCIUM 9.6   GFR: Estimated Creatinine Clearance: 44.8 mL/min (by C-G formula based on SCr of 0.98 mg/dL). Liver Function Tests: Recent Labs  Lab 11/15/19 1103  AST 15  ALT 17  ALKPHOS 102  BILITOT 2.1*  PROT 7.4  ALBUMIN 4.1   No results for input(s): LIPASE, AMYLASE in the last 168 hours. No results for input(s): AMMONIA in the last 168 hours. Coagulation Profile: Recent Labs  Lab  11/15/19 1838  INR 1.0   Cardiac Enzymes: No results for input(s): CKTOTAL, CKMB,  CKMBINDEX, TROPONINI in the last 168 hours. BNP (last 3 results) No results for input(s): PROBNP in the last 8760 hours. HbA1C: No results for input(s): HGBA1C in the last 72 hours. CBG: No results for input(s): GLUCAP in the last 168 hours. Lipid Profile: No results for input(s): CHOL, HDL, LDLCALC, TRIG, CHOLHDL, LDLDIRECT in the last 72 hours. Thyroid Function Tests: No results for input(s): TSH, T4TOTAL, FREET4, T3FREE, THYROIDAB in the last 72 hours. Anemia Panel: No results for input(s): VITAMINB12, FOLATE, FERRITIN, TIBC, IRON, RETICCTPCT in the last 72 hours. Urine analysis:    Component Value Date/Time   COLORURINE YELLOW (A) 11/15/2019 1103   APPEARANCEUR CLOUDY (A) 11/15/2019 1103   APPEARANCEUR Clear 01/16/2014 1530   LABSPEC 1.013 11/15/2019 1103   LABSPEC 1.009 01/16/2014 1530   PHURINE 5.0 11/15/2019 1103   GLUCOSEU >=500 (A) 11/15/2019 1103   GLUCOSEU Negative 01/16/2014 1530   HGBUR NEGATIVE 11/15/2019 1103   BILIRUBINUR NEGATIVE 11/15/2019 1103   BILIRUBINUR Negative 12/16/2016 1519   BILIRUBINUR Negative 01/16/2014 1530   KETONESUR 5 (A) 11/15/2019 1103   PROTEINUR >=300 (A) 11/15/2019 1103   UROBILINOGEN 0.2 12/16/2016 1519   NITRITE NEGATIVE 11/15/2019 1103   LEUKOCYTESUR SMALL (A) 11/15/2019 1103   LEUKOCYTESUR Negative 01/16/2014 1530   Sepsis Labs: @LABRCNTIP (procalcitonin:4,lacticidven:4) ) Recent Results (from the past 240 hour(s))  SARS Coronavirus 2 by RT PCR (hospital order, performed in [facility] hospital lab) Nasopharyngeal Nasopharyngeal Swab     Status: None   Collection Time: 11/15/19  6:38 PM   Specimen: Nasopharyngeal Swab  Result Value Ref Range Status   SARS Coronavirus 2 NEGATIVE NEGATIVE Final    Comment: (NOTE) SARS-CoV-2 target nucleic acids are NOT DETECTED.  The SARS-CoV-2 RNA is generally detectable in upper and lower respiratory  specimens during the acute phase of infection. The lowest concentration of SARS-CoV-2 viral copies this assay can detect is 250 copies / mL. A negative result does not preclude SARS-CoV-2 infection and should not be used as the sole basis for treatment or other patient management decisions.  A negative result may occur with improper specimen collection / handling, submission of specimen other than nasopharyngeal swab, presence of viral mutation(s) within the areas targeted by this assay, and inadequate number of viral copies (<250 copies / mL). A negative result must be combined with clinical observations, patient history, and epidemiological information.  Fact Sheet for Patients:   StrictlyIdeas.no  Fact Sheet for Healthcare Providers: BankingDealers.co.za  This test is not yet approved or  cleared by the [place] FDA and has been authorized for detection and/or diagnosis of SARS-CoV-2 by FDA under an Emergency Use Authorization (EUA).  This EUA will remain in effect (meaning this test can be used) for the duration of the COVID-19 declaration under Section 564(b)(1) of the Act, 21 U.S.C. section 360bbb-3(b)(1), unless the authorization is terminated or revoked sooner.  Performed at [facility], [address] [place], [place] [address]      Radiological Exams on Admission: CT Head Wo Contrast  Result Date: 11/15/2019 CLINICAL DATA:  Headache EXAM: CT HEAD WITHOUT CONTRAST TECHNIQUE: Contiguous axial images were obtained from the base of the skull through the vertex without intravenous contrast. COMPARISON:  None. FINDINGS: Brain: No evidence of acute infarction, hemorrhage, hydrocephalus, extra-axial collection or mass lesion/mass effect. Mild age related cerebral volume loss. Vascular: Atherosclerotic calcifications involving the large vessels of the skull base. No unexpected hyperdense vessel. Skull: Normal. Negative  for fracture or focal lesion. Sinuses/Orbits: Mild mucosal  thickening within the left ethmoid air cells. Remaining visualized paranasal sinuses and mastoid air cells are clear. Orbital structures intact. Other: None. IMPRESSION: 1. No acute intracranial findings. 2. Mild left ethmoid sinus disease. Electronically Signed   By: Davina Poke D.O.   On: 11/15/2019 11:26   DG Chest Port 1 View  Result Date: 11/15/2019 CLINICAL DATA:  Onset headache and altered mental status today. EXAM: PORTABLE CHEST 1 VIEW COMPARISON:  Single-view of the chest 01/01/2017. FINDINGS: Lung volumes are low with mild left basilar atelectasis. Cardiomegaly and vascular congestion. No consolidative process, pneumothorax or effusion. Aortic atherosclerosis noted. No acute or focal bony abnormality. IMPRESSION: Cardiomegaly and vascular congestion. Mild basilar atelectasis in a low volume chest. Aortic Atherosclerosis (ICD10-I70.0). Electronically Signed   By: Inge Rise M.D.   On: 11/15/2019 12:12    EKG: Independently reviewed.  Sinus tachycardia  Assessment/Plan Principal Problem:   Sepsis ([place]) Active Problems:   Chronic systolic heart failure (HCC)   Chronic obstructive pulmonary disease (HCC)   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 edema, with long-term current use of insulin (HCC)   Coronary artery disease of native artery of native heart with stable angina pectoris ([place])   Spinal stenosis of lumbar region     #1 severe sepsis: I suspect this to be possibly UTI but again due to headaches and the altered mental status it is worrisome for possible meningitis.  Since lumbar puncture is not agreeable to the family we will treat empirically.  Patient will be treated for UTI including with Rocephin and broad-spectrum antibiotics for sepsis.  I will also agree with acyclovir empirically until we rule out meningitis.  May need to get ID involvement.  In  the meantime blood cultures obtained follow urine cultures.  #2 altered mental status: Again this could be secondary to sepsis.  It could be meningitis although she has no meningeal symptoms except for the associated headache.  Could also be UTI related.  We will monitor response.  #3 diabetes: Sliding scale insulin and other medications will be obtained.  #4 spinal stenosis: Patient has chronic pain taking hydrocodone.  I will continue with home regimen to avoid withdrawal symptoms.  #5 COPD: No acute exacerbation.  Continue treatment.  #6 coronary artery disease: Appears stable close to baseline.  Continue treatment.   DVT prophylaxis: Lovenox Code Status: Full code Family Communication: Daughter at bedside Disposition Plan: To be determined Consults called: None but may need neurology consult in the morning if not improved Admission status: Inpatient  Severity of Illness: The appropriate patient status for this patient is INPATIENT. Inpatient status is judged to be reasonable and necessary in order to provide the required intensity of service to ensure the patient's safety. The patient's presenting symptoms, physical exam findings, and initial radiographic and laboratory data in the context of their chronic comorbidities is felt to place them at high risk for further clinical deterioration. Furthermore, it is not anticipated that the patient will be medically stable for discharge from the hospital within 2 midnights of admission. The following factors support the patient status of inpatient.   " The patient's presenting symptoms include altered mental status, fever. " The worrisome physical exam findings include patient is confused completely. " The initial radiographic and laboratory data are worrisome because of evidence of UTI and sepsis. " The chronic co-morbidities include diabetes and hypertension.   * I certify that at the point of admission it is my  clinical judgment that the  patient will require inpatient hospital care spanning beyond 2 midnights from the point of admission due to high intensity of service, high risk for further deterioration and high frequency of surveillance required.Barbette Merino MD [facility] Pager [phone]  If 7PM-7AM, please contact night-coverage [email or web address] Password [facility]  11/15/2019, 11:46 PM

## 2019-11-15 NOTE — Progress Notes (Signed)
Notified bedside nurse of need to draw blood cultures and lactate now . (orders are timed for 1120 this am but not done).

## 2019-11-15 NOTE — ED Notes (Signed)
Dr Isaacs at bedside 

## 2019-11-15 NOTE — Progress Notes (Signed)
PHARMACY NOTE:  ANTIMICROBIAL RENAL DOSAGE ADJUSTMENT  Current antimicrobial regimen includes a mismatch between antimicrobial dosage and estimated renal function.  As per policy approved by the Pharmacy & Therapeutics and Medical Executive Committees, the antimicrobial dosage will be adjusted accordingly.  Current antimicrobial dosage:  Ampicillin 2g q4h  Indication: Meningitis  Renal Function:  Estimated Creatinine Clearance: 44.8 mL/min (by C-G formula based on SCr of 0.98 mg/dL). []      On intermittent HD, scheduled: []      On CRRT    Antimicrobial dosage has been changed to:  Ampicillin 2g q6h  Additional comments:   Thank you for allowing pharmacy to be a part of this patient's care.  Lu Duffel, PharmD, BCPS Clinical Pharmacist 11/15/2019 7:24 PM

## 2019-11-15 NOTE — Consult Note (Signed)
PHARMACY -  BRIEF ANTIBIOTIC NOTE   Pharmacy has received consult(s) for vancomycin from an ED provider.  The patient's profile has been reviewed for ht/wt/allergies/indication/available labs.    One time order(s) placed for vancomycin 1750 mg IV x 1 dose  Further antibiotics/pharmacy consults should be ordered by admitting physician if indicated.                       Thank you, Benn Moulder, PharmD Pharmacy Resident  11/15/2019 6:36 PM

## 2019-11-15 NOTE — Progress Notes (Signed)
CODE SEPSIS - PHARMACY COMMUNICATION  **Broad Spectrum Antibiotics should be administered within 1 hour of Sepsis diagnosis**  Time Code Sepsis Called/Page Received: 1752  Antibiotics Ordered: pending  Time of 1st antibiotic administration: Ceftriaxone   Additional action taken by pharmacy: 8416  If necessary, Name of Provider/Nurse Contacted: [place], PharmD, BCPS Clinical Pharmacist 11/15/2019 5:58 PM

## 2019-11-15 NOTE — ED Provider Notes (Addendum)
[facility] Emergency Department Provider Note  ____________________________________________   First MD Initiated Contact with Patient 11/15/19 1750     (approximate)  I have reviewed the triage vital signs and the nursing notes.   HISTORY  Chief Complaint Altered Mental Status and Headache    HPI Andrea [name] is a 74 y.o. female  With h/o DM, CAD, HTN, HLD, prior CVA/TIA, here with confusion, headache. Pt confused on arrival limiting history. Per family report, pt was last spoken to yesterday and was reportedly normal. She would not respond when they called to check on her today. She is normally awake, alert, oriented, responsive. Family came to check on her and she was confused w/ word finding difficulty, c/o headache, and chills. Unknown if she has had any preceding sx. Headaches are not usual for her. She subsequently was brought here by family for further evaluation. H/o UTIs.  Level 5 caveat invoked as remainder of history, ROS, and physical exam limited due to patient's confusion/AMS.         Past Medical History:  Diagnosis Date  . Bronchitis   . Chickenpox   . Coronary artery disease   . Depression   . Diabetes mellitus type 2, uncomplicated ([place])   . Diabetes mellitus without complication ([place])   . Diabetic retinopathy ([place])   . Dupuytren contracture   . Frequent urinary tract infections   . GERD (gastroesophageal reflux disease)   . History of hand surgery 2011   left hand  . Hyperlipidemia, unspecified   . Irritable bowel syndrome   . MI (myocardial infarction) ([place]) 1994  . Neuromuscular disorder ([place])    nerve pain in back and legs  . Osteoporosis   . Pneumonia 2014  . Stroke ([place])   . TIA (transient ischemic attack)   . Vitamin D deficiency, unspecified   . Wheezing     Patient Active Problem List   Diagnosis Date Noted  . Sepsis ([place]) 11/15/2019  . PAD (peripheral artery disease) ([place]) 12/28/2018  . Moderate  episode of recurrent major depressive disorder ([place]) 12/19/2017  . Leg length discrepancy 09/21/2017  . Coronary artery disease of native artery of native heart with stable angina pectoris ([place]) 01/24/2017  . Drug-induced constipation 01/06/2017  . Spinal stenosis of lumbar region 01/06/2017  . Spondylolisthesis of lumbar region 12/30/2016  . Obese 04/16/2015  . History of colon polyps 11/23/2012  . Bundle branch block, 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  . 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 Encounter for long-term (current) use of aspirin 11/23/2012  . Status post percutaneous transluminal coronary angioplasty 11/23/2012  . Transient ischemic attack (TIA), and cerebral infarction without residual deficits(V12.54) 11/23/2012  . Absolute anemia 08/17/2012  . Chronic obstructive pulmonary disease ([name]) 08/17/2012  . HLD (hyperlipidemia) 08/17/2012  . OP (osteoporosis) 08/17/2012  . 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 edema, with long-term current use of insulin ([place]) 08/17/2012  . Chronic systolic heart failure ([place]) 07/30/2012    Past Surgical History:  Procedure Laterality Date  . CARDIAC CATHETERIZATION  1994  . COLONOSCOPY     x3  . COLONOSCOPY WITH PROPOFOL N/A 02/08/2019   Procedure: COLONOSCOPY WITH PROPOFOL;  Surgeon: [name], MD;  Location: [facility] ENDOSCOPY;  Service: Gastroenterology;  Laterality: N/A;  . CORONARY ANGIOPLASTY  1994  . EYE SURGERY Bilateral    cataracts  . HAND SURGERY Left 2011   left  hand release of contractures  . HIP FRACTURE SURGERY  11/2013  . PR COLSC FLX W/REMOVAL LESION BY HOT BX FORCEPS   08/21/2015   Procedure: COLONOSCOPY, FLEXIBLE, PROXIMAL TO SPLENIC FLEXURE; W/REMOVAL TUMOR/POLYP/OTHER LESION, HOT BX FORCEP/CAUTE; Surgeon: Carlena Hurl, MD; Location: OR [facility]; Service: General Surgery    Prior to  Admission medications   Medication Sig Start Date End Date Taking? Authorizing Provider  aspirin EC 81 MG tablet Take 81 mg daily by mouth.   Yes [provider]  atorvastatin (LIPITOR) 80 MG tablet TAKE 1 TABLET BY MOUTH EVERY DAY Patient taking differently: Take 80 mg by mouth at bedtime.  09/27/19  Yes Minna Merritts, MD  Calcium Carb-Cholecalciferol ([employer] CALCIUM 600+D) 600-800 MG-UNIT TABS Take 1 tablet by mouth in the morning and at bedtime.    Yes [provider]  Cyanocobalamin (B-12) 1000 MCG CAPS Take 1,000 mcg by mouth daily.    Yes [provider]  HYDROcodone-acetaminophen (NORCO) 10-325 MG tablet Take 1 tablet by mouth every 8 (eight) hours as needed. Patient taking differently: Take 1 tablet by mouth 2 (two) times daily.  10/15/19  Yes [name] M, PA-C  insulin glargine (LANTUS) 100 UNIT/ML injection INJECT 65 UNITS INTO THE SKIN TWICE DAILY WITH A MEAL Patient taking differently: Inject 65 Units into the skin 2 (two) times daily.  10/15/19  Yes [name] M, PA-C  losartan (COZAAR) 25 MG tablet TAKE 1 TABLET BY MOUTH EVERY DAY Patient taking differently: Take 25 mg by mouth every morning.  05/25/19  Yes Mar Daring, PA-C  metoprolol succinate (TOPROL-XL) 25 MG 24 hr tablet Take 1 tablet (25 mg total) by mouth daily. Patient taking differently: Take 12.5 mg by mouth every morning.  05/28/19  Yes Burnette, Clearnce Sorrel, PA-C  omeprazole (PRILOSEC) 40 MG capsule TAKE 1 CAPSULE BY MOUTH EVERY DAY Patient taking differently: Take 40 mg by mouth every morning.  07/31/19  Yes Burnette, Clearnce Sorrel, PA-C  oxybutynin (DITROPAN-XL) 10 MG 24 hr tablet TAKE 1 TABLET BY MOUTH EVERYDAY AT BEDTIME Patient taking differently: Take 10 mg by mouth every morning.  06/22/19  Yes Mar Daring, PA-C  sertraline (ZOLOFT) 25 MG tablet Take 25 mg by mouth daily.   Yes [provider]  TRULICITY 1.5 CL/2.7NT SOPN INJECT 1.5MG  INTO THE SKIN ONCE A  WEEK AS DIRECTED Patient taking differently: Inject 1.5 mg into the skin every Wednesday.  07/28/19  Yes Burnette, Clearnce Sorrel, PA-C  Vitamin D, Ergocalciferol, (DRISDOL) 1.25 MG (50000 UNIT) CAPS capsule TAKE ONE CAPSULE BY MOUTH ONCE WEEKLY ON THE SAME DAY EACH WEEK (ON FRIDAYS) Patient taking differently: Take 50,000 Units by mouth every 7 (seven) days. Friday 08/07/19  Yes Mar Daring, PA-C  Alcohol Swabs (ALCOHOL PREP) PADS Use twice daily prior to SQ injection of insulin to clean skin 10/02/15   Mar Daring, PA-C  INSULIN SYRINGE 1CC/29G 29G X 1/2" 1 ML MISC For lantus injections twice daily 10/15/19   Mar Daring, PA-C  Multiple Vitamins-Minerals (ICAPS AREDS 2 PO) Take 1 capsule by mouth 2 (two) times daily.  Patient not taking: Reported on 11/16/2019    [provider]  nystatin (MYCOSTATIN/NYSTOP) powder Apply 1 application topically 3 (three) times daily. Patient not taking: Reported on 11/06/2019 07/05/19   Mar Daring, PA-C  Omega-3 Fatty Acids (FISH OIL) 1200 MG CAPS Take 1 capsule (1,200 mg total) by mouth daily. Patient not taking: Reported on 11/15/2019 03/22/17  [name] M, PA-C  OneTouch Delica Lancets 78I MISC To check blood sugar daily  DX: E11.9 05/01/19   Mar Daring, PA-C  ONETOUCH VERIO test strip TO CHECK BLOOD SUGAR ONCE DAILY. 10/25/19   Mar Daring, PA-C    Allergies Metformin and related and Sulfa antibiotics  Family History  Problem Relation Age of Onset  . Depression Daughter   . Arthritis Daughter        spine  . Plantar fasciitis Daughter   . Anxiety disorder Daughter   . Hyperlipidemia Daughter   . AAA (abdominal aortic aneurysm) Daughter   . Hypertension Mother   . Stroke Mother   . Cataracts Mother   . Depression Mother   . Heart disease Mother   . Asthma Brother   . Diabetes Daughter   . Hyperlipidemia Daughter   . Hypertension Daughter     Social History Social History   Tobacco  Use  . Smoking status: Former Smoker    Packs/day: 1.00    Years: 30.00    Pack years: 30.00    Types: Cigarettes    Quit date: 07/17/2012    Years since quitting: 7.3  . Smokeless tobacco: Never Used  . Tobacco comment:    Vaping Use  . Vaping Use: Never used  Substance Use Topics  . Alcohol use: Not Currently    Alcohol/week: 0.0 standard drinks  . Drug use: No    Review of Systems  Review of Systems  Constitutional: Positive for fatigue. Negative for chills and fever.  HENT: Negative for sore throat.   Respiratory: Negative for shortness of breath.   Cardiovascular: Negative for chest pain.  Gastrointestinal: Positive for nausea. Negative for abdominal pain.  Genitourinary: Negative for flank pain.  Musculoskeletal: Negative for neck pain.  Skin: Negative for rash and wound.  Allergic/Immunologic: Negative for immunocompromised state.  Neurological: Positive for weakness and headaches. Negative for numbness.  Hematological: Does not bruise/bleed easily.  Psychiatric/Behavioral: Positive for confusion.  All other systems reviewed and are negative.    ____________________________________________  PHYSICAL EXAM:      VITAL SIGNS: ED Triage Vitals  Enc Vitals Group     BP 11/15/19 1049 (!) 166/89     Pulse Rate 11/15/19 1049 (!) 119     Resp 11/15/19 1049 18     Temp 11/15/19 1049 97.7 F (36.5 C)     Temp Source 11/15/19 1049 Oral     SpO2 11/15/19 1049 95 %     Weight 11/15/19 1050 160 lb 8 oz (72.8 kg)     Height 11/15/19 1050 5' (1.524 m)     Head Circumference --      Peak Flow --      Pain Score --      Pain Loc --      Pain Edu? --      Excl. in [place]? --      Physical Exam Vitals and nursing note reviewed.  Constitutional:      General: She is not in acute distress.    Appearance: She is well-developed.  HENT:     Head: Normocephalic and atraumatic.     Comments: Dry MM Eyes:     Conjunctiva/sclera: Conjunctivae normal.  Neck:     Comments: No  apparent meningismus Cardiovascular:     Rate and Rhythm: Regular rhythm. Tachycardia present.     Heart sounds: Normal heart sounds. No murmur heard.  No friction rub.  Pulmonary:  Effort: Pulmonary effort is normal. No respiratory distress.     Breath sounds: Normal breath sounds. No wheezing or rales.  Abdominal:     General: There is no distension.     Palpations: Abdomen is soft.     Tenderness: There is no abdominal tenderness.  Musculoskeletal:     Cervical back: Neck supple.  Skin:    General: Skin is warm.     Capillary Refill: Capillary refill takes less than 2 seconds.  Neurological:     Mental Status: She is alert and oriented to person, place, and time.     Motor: No abnormal muscle tone.  Psychiatric:     Comments: Confused, oriented to person only. MAE with at least antigravity strength. No apparent CN deficits. Speech is slurred, however.       ____________________________________________   LABS (all labs ordered are listed, but only abnormal results are displayed)  Labs Reviewed  COMPREHENSIVE METABOLIC PANEL - Abnormal; Notable for the following components:      Result Value   Sodium 133 (*)    Glucose, Bld 269 (*)    Total Bilirubin 2.1 (*)    GFR calc non Af Amer 57 (*)    All other components within normal limits  CBC - Abnormal; Notable for the following components:   WBC 21.5 (*)    All other components within normal limits  APTT - Abnormal; Notable for the following components:   aPTT 41 (*)    All other components within normal limits  URINALYSIS, COMPLETE (UACMP) WITH MICROSCOPIC - Abnormal; Notable for the following components:   Color, Urine YELLOW (*)    APPearance CLOUDY (*)    Glucose, UA >=500 (*)    Ketones, ur 5 (*)    Protein, ur >=300 (*)    Leukocytes,Ua SMALL (*)    Bacteria, UA MANY (*)    All other components within normal limits  GLUCOSE, CAPILLARY - Abnormal; Notable for the following components:   Glucose-Capillary  384 (*)    All other components within normal limits  SARS CORONAVIRUS 2 BY RT PCR (HOSPITAL ORDER, [facility])  URINE CULTURE  CULTURE, BLOOD (ROUTINE X 2)  CULTURE, BLOOD (SINGLE)  LACTIC ACID, PLASMA  LACTIC ACID, PLASMA  PROTIME-INR  PROTIME-INR  CORTISOL-AM, BLOOD  PROCALCITONIN  COMPREHENSIVE METABOLIC PANEL  CBC WITH DIFFERENTIAL/PLATELET  VANCOMYCIN, RANDOM    ____________________________________________  EKG: Sinus tachycardia, QRS 152, QTc 532. PACs. RBBB. No acute ST elevations ________________________________________  RADIOLOGY All imaging, including plain films, CT scans, and ultrasounds, independently reviewed by me, and interpretations confirmed via formal radiology reads.  ED MD interpretation:   CT Head: NAICA CXR: Clear  Official radiology report(s): CT Head Wo Contrast  Result Date: 11/15/2019 CLINICAL DATA:  Headache EXAM: CT HEAD WITHOUT CONTRAST TECHNIQUE: Contiguous axial images were obtained from the base of the skull through the vertex without intravenous contrast. COMPARISON:  None. FINDINGS: Brain: No evidence of acute infarction, hemorrhage, hydrocephalus, extra-axial collection or mass lesion/mass effect. Mild age related cerebral volume loss. Vascular: Atherosclerotic calcifications involving the large vessels of the skull base. No unexpected hyperdense vessel. Skull: Normal. Negative for fracture or focal lesion. Sinuses/Orbits: Mild mucosal thickening within the left ethmoid air cells. Remaining visualized paranasal sinuses and mastoid air cells are clear. Orbital structures intact. Other: None. IMPRESSION: 1. No acute intracranial findings. 2. Mild left ethmoid sinus disease. Electronically Signed   By: Davina Poke D.O.   On: 11/15/2019 11:26  DG Chest Port 1 View  Result Date: 11/15/2019 CLINICAL DATA:  Onset headache and altered mental status today. EXAM: PORTABLE CHEST 1 VIEW COMPARISON:  Single-view of the chest  01/01/2017. FINDINGS: Lung volumes are low with mild left basilar atelectasis. Cardiomegaly and vascular congestion. No consolidative process, pneumothorax or effusion. Aortic atherosclerosis noted. No acute or focal bony abnormality. IMPRESSION: Cardiomegaly and vascular congestion. Mild basilar atelectasis in a low volume chest. Aortic Atherosclerosis (ICD10-I70.0). Electronically Signed   By: Inge Rise M.D.   On: 11/15/2019 12:12    ____________________________________________  PROCEDURES   Procedure(s) performed (including Critical Care):  .Critical Care Performed by: Duffy Bruce, MD Authorized by: Duffy Bruce, MD   Critical care provider statement:    Critical care time (minutes):  35   Critical care time was exclusive of:  Separately billable procedures and treating other patients and teaching time   Critical care was necessary to treat or prevent imminent or life-threatening deterioration of the following conditions:  Cardiac failure, circulatory failure and respiratory failure   Critical care was time spent personally by me on the following activities:  Development of treatment plan with patient or surrogate, discussions with consultants, evaluation of patient's response to treatment, examination of patient, obtaining history from patient or surrogate, ordering and performing treatments and interventions, ordering and review of laboratory studies, ordering and review of radiographic studies, pulse oximetry, re-evaluation of patient's condition and review of old charts   I assumed direction of critical care for this patient from another provider in my specialty: no      ____________________________________________  INITIAL IMPRESSION / MDM / [place] / ED COURSE  As part of my medical decision making, I reviewed the following data within the [place] notes reviewed and incorporated, Old chart reviewed, Notes from prior ED visits,  and [place] Controlled Substance Database       *TELICIA HODGKISS was evaluated in Emergency Department on 11/16/2019 for the symptoms described in the history of present illness. She was evaluated in the context of the global COVID-19 pandemic, which necessitated consideration that the patient might be at risk for infection with the SARS-CoV-2 virus that causes COVID-19. Institutional protocols and algorithms that pertain to the evaluation of patients at risk for COVID-19 are in a state of rapid change based on information released by regulatory bodies including the CDC and federal and state organizations. These policies and algorithms were followed during the patient's care in the ED.  Some ED evaluations and interventions may be delayed as a result of limited staffing during the pandemic.*     Medical Decision Making:  74 yo F here with headache, confusion, weakness. On arrival, pt febrile, tachycardic, but with normal BP. HR 100s. CXR is clear. CT Head reviewed and negative for acute abnormality. Labs from triage show marked leukocytosis, mild hyperbilirubinemia likely from infection and dehydration. Abdomen is soft. UA does show ketones, pyuria and bacteriuria. This could certainly be etiology though her HA and confusion seems somewhat out of proportion. No meningismus. Discussed with family, who would prefer minimizing any aggressive intervention/LP at this time. Will send UCx, empirically tx for possible CNS involvement, and admit. May ultimately benefit from MR/reassessment once more history can be obtained, consent for LP.  COVID negative. Pt has been given empiric decadron, Rocephin/Vanc/Amp, and Acyclovir. Will re-discuss with daughter re: LP. Will likely need sedation in order to obtain LP, which certainly carries its own risks, but will discuss further. ____________________________________________  FINAL CLINICAL IMPRESSION(S) / ED DIAGNOSES  Final diagnoses:  Sepsis with encephalopathy  without septic shock, due to unspecified organism [facility])     MEDICATIONS GIVEN DURING THIS VISIT:  Medications  ampicillin (OMNIPEN) 2 g in sodium chloride 0.9 % 100 mL IVPB (0 g Intravenous Stopped 11/15/19 2040)  lidocaine-prilocaine (EMLA) cream ( Topical Refused 11/15/19 2043)  acyclovir (ZOVIRAX) 700 mg in dextrose 5 % 100 mL IVPB (0 mg Intravenous Stopped 11/15/19 2302)  vitamin B-12 (CYANOCOBALAMIN) tablet 1,000 mcg (has no administration in time range)  aspirin EC tablet 81 mg (has no administration in time range)  losartan (COZAAR) tablet 25 mg (has no administration in time range)  metoprolol succinate (TOPROL-XL) 24 hr tablet 25 mg (has no administration in time range)  oxybutynin (DITROPAN-XL) 24 hr tablet 10 mg (has no administration in time range)  pantoprazole (PROTONIX) EC tablet 40 mg (has no administration in time range)  atorvastatin (LIPITOR) tablet 80 mg (80 mg Oral Refused 11/16/19 0007)  HYDROcodone-acetaminophen (NORCO) 10-325 MG per tablet 1 tablet (has no administration in time range)  insulin glargine (LANTUS) injection 65 Units (has no administration in time range)  enoxaparin (LOVENOX) injection 40 mg (40 mg Subcutaneous Given 11/16/19 0127)  metroNIDAZOLE (FLAGYL) IVPB 500 mg (500 mg Intravenous New Bag/Given 11/16/19 0130)  acetaminophen (TYLENOL) tablet 650 mg (has no administration in time range)    Or  acetaminophen (TYLENOL) suppository 650 mg (has no administration in time range)  ondansetron (ZOFRAN) tablet 4 mg (has no administration in time range)    Or  ondansetron (ZOFRAN) injection 4 mg (has no administration in time range)  morphine 2 MG/ML injection 2 mg (has no administration in time range)  vancomycin variable dose per unstable renal function (pharmacist dosing) (has no administration in time range)  acetaminophen (TYLENOL) tablet 650 mg (650 mg Oral Given 11/15/19 1607)  cefTRIAXone (ROCEPHIN) 2 g in sodium chloride 0.9 % 100 mL IVPB (0 g Intravenous  Stopped 11/15/19 1921)  dexamethasone (DECADRON) injection 10 mg (10 mg Intravenous Given 11/15/19 1853)  lactated ringers bolus 1,000 mL (0 mLs Intravenous Stopped 11/15/19 2153)  vancomycin (VANCOREADY) IVPB 1750 mg/350 mL (0 mg Intravenous Stopped 11/15/19 2142)  ceFEPIme (MAXIPIME) 2 g in sodium chloride 0.9 % 100 mL IVPB (0 g Intravenous Stopped 11/16/19 0033)     ED Discharge Orders    None       Note:  This document was prepared using Dragon voice recognition software and may include unintentional dictation errors.   Duffy Bruce, MD 11/15/19 1655    Duffy Bruce, MD 11/16/19 Natasha Mead

## 2019-11-16 ENCOUNTER — Other Ambulatory Visit: Admission: RE | Admit: 2019-11-16 | Payer: Medicare Other | Source: Ambulatory Visit

## 2019-11-16 ENCOUNTER — Encounter: Payer: Self-pay | Admitting: Internal Medicine

## 2019-11-16 DIAGNOSIS — R410 Disorientation, unspecified: Secondary | ICD-10-CM

## 2019-11-16 DIAGNOSIS — B953 Streptococcus pneumoniae as the cause of diseases classified elsewhere: Secondary | ICD-10-CM

## 2019-11-16 DIAGNOSIS — R519 Headache, unspecified: Secondary | ICD-10-CM

## 2019-11-16 LAB — BLOOD CULTURE ID PANEL (REFLEXED) - BCID2

## 2019-11-16 LAB — CBC WITH DIFFERENTIAL/PLATELET
Abs Immature Granulocytes: 0.12 10*3/uL — ABNORMAL HIGH (ref 0.00–0.07)
Basophils Absolute: 0 10*3/uL (ref 0.0–0.1)
Basophils Relative: 0 %
Eosinophils Absolute: 0 10*3/uL (ref 0.0–0.5)
Eosinophils Relative: 0 %
HCT: 33.8 % — ABNORMAL LOW (ref 36.0–46.0)
Hemoglobin: 11.9 g/dL — ABNORMAL LOW (ref 12.0–15.0)
Immature Granulocytes: 1 %
Lymphocytes Relative: 4 %
Lymphs Abs: 0.7 10*3/uL (ref 0.7–4.0)
MCH: 30.6 pg (ref 26.0–34.0)
MCHC: 35.2 g/dL (ref 30.0–36.0)
MCV: 86.9 fL (ref 80.0–100.0)
Monocytes Absolute: 0.7 10*3/uL (ref 0.1–1.0)
Monocytes Relative: 4 %
Neutro Abs: 15.9 10*3/uL — ABNORMAL HIGH (ref 1.7–7.7)
Neutrophils Relative %: 91 %
Platelets: 143 10*3/uL — ABNORMAL LOW (ref 150–400)
RBC: 3.89 MIL/uL (ref 3.87–5.11)
RDW: 13.6 % (ref 11.5–15.5)
WBC: 17.4 10*3/uL — ABNORMAL HIGH (ref 4.0–10.5)
nRBC: 0 % (ref 0.0–0.2)

## 2019-11-16 LAB — COMPREHENSIVE METABOLIC PANEL
ALT: 12 U/L (ref 0–44)
AST: 13 U/L — ABNORMAL LOW (ref 15–41)
Albumin: 3.2 g/dL — ABNORMAL LOW (ref 3.5–5.0)
Alkaline Phosphatase: 80 U/L (ref 38–126)
Anion gap: 14 (ref 5–15)
BUN: 21 mg/dL (ref 8–23)
CO2: 21 mmol/L — ABNORMAL LOW (ref 22–32)
Calcium: 8.6 mg/dL — ABNORMAL LOW (ref 8.9–10.3)
Chloride: 102 mmol/L (ref 98–111)
Creatinine, Ser: 0.96 mg/dL (ref 0.44–1.00)
GFR calc Af Amer: >60 mL/min (ref >60)
GFR calc non Af Amer: 58 mL/min — ABNORMAL LOW (ref >60)
Glucose, Bld: 351 mg/dL — ABNORMAL HIGH (ref 70–99)
Potassium: 4 mmol/L (ref 3.5–5.1)
Sodium: 137 mmol/L (ref 135–145)
Total Bilirubin: 1.9 mg/dL — ABNORMAL HIGH (ref 0.3–1.2)
Total Protein: 6.2 g/dL — ABNORMAL LOW (ref 6.5–8.1)

## 2019-11-16 LAB — PROTIME-INR
INR: 1.2 (ref 0.8–1.2)
Prothrombin Time: 14.3 seconds (ref 11.4–15.2)

## 2019-11-16 LAB — GLUCOSE, CAPILLARY
Glucose-Capillary: 384 mg/dL — ABNORMAL HIGH (ref 70–99)
Glucose-Capillary: 297 mg/dL — ABNORMAL HIGH (ref 70–99)
Glucose-Capillary: 267 mg/dL — ABNORMAL HIGH (ref 70–99)
Glucose-Capillary: 406 mg/dL — ABNORMAL HIGH (ref 70–99)
Glucose-Capillary: 416 mg/dL — ABNORMAL HIGH (ref 70–99)

## 2019-11-16 LAB — PROCALCITONIN: Procalcitonin: 0.35 ng/mL

## 2019-11-16 LAB — CORTISOL-AM, BLOOD: Cortisol - AM: 14.6 ug/dL (ref 6.7–22.6)

## 2019-11-16 MED ORDER — INSULIN ASPART 100 UNIT/ML ~~LOC~~ SOLN
0.0000 [IU] | Freq: Three times a day (TID) | SUBCUTANEOUS | Status: DC
  Administered 2019-11-16: 8 [IU] via SUBCUTANEOUS
  Filled 2019-11-16: qty 1

## 2019-11-16 MED ORDER — LACTATED RINGERS IV SOLN: INTRAVENOUS | Status: DC

## 2019-11-16 MED ORDER — INSULIN ASPART 100 UNIT/ML ~~LOC~~ SOLN: 0.0000 [IU] | Freq: Every day | SUBCUTANEOUS | Status: DC

## 2019-11-16 MED ORDER — SODIUM CHLORIDE 0.9 % IV SOLN
2.0000 g | Freq: Two times a day (BID) | INTRAVENOUS | Status: DC
  Administered 2019-11-16 – 2019-11-19 (×7): 2 g via INTRAVENOUS
  Filled 2019-11-16: qty 2
  Filled 2019-11-16: qty 20
  Filled 2019-11-16: qty 2
  Filled 2019-11-16 (×5): qty 20
  Filled 2019-11-16 (×2): qty 2
  Filled 2019-11-16: qty 20

## 2019-11-16 MED ORDER — SODIUM CHLORIDE 0.9 % IV SOLN: 2.0000 g | Freq: Two times a day (BID) | INTRAVENOUS | Status: DC

## 2019-11-16 MED ORDER — VANCOMYCIN VARIABLE DOSE PER UNSTABLE RENAL FUNCTION (PHARMACIST DOSING): Status: DC

## 2019-11-16 MED ORDER — INSULIN GLARGINE 100 UNIT/ML ~~LOC~~ SOLN
65.0000 [IU] | Freq: Every day | SUBCUTANEOUS | Status: DC
  Filled 2019-11-16: qty 0.65

## 2019-11-16 MED ORDER — LORATADINE 10 MG PO TABS
10.0000 mg | ORAL_TABLET | Freq: Every day | ORAL | Status: DC
  Administered 2019-11-16 – 2019-11-24 (×9): 10 mg via ORAL
  Filled 2019-11-16 (×9): qty 1

## 2019-11-16 MED ORDER — INSULIN ASPART 100 UNIT/ML ~~LOC~~ SOLN
0.0000 [IU] | Freq: Three times a day (TID) | SUBCUTANEOUS | Status: DC
  Administered 2019-11-16: 20 [IU] via SUBCUTANEOUS
  Administered 2019-11-17: 15 [IU] via SUBCUTANEOUS
  Administered 2019-11-17: 4 [IU] via SUBCUTANEOUS
  Administered 2019-11-17: 11 [IU] via SUBCUTANEOUS
  Administered 2019-11-17: 7 [IU] via SUBCUTANEOUS
  Administered 2019-11-18: 4 [IU] via SUBCUTANEOUS
  Administered 2019-11-18: 11 [IU] via SUBCUTANEOUS
  Administered 2019-11-18 (×2): 7 [IU] via SUBCUTANEOUS
  Administered 2019-11-19: 3 [IU] via SUBCUTANEOUS
  Administered 2019-11-19: 7 [IU] via SUBCUTANEOUS
  Administered 2019-11-19: 11 [IU] via SUBCUTANEOUS
  Administered 2019-11-19: 15 [IU] via SUBCUTANEOUS
  Administered 2019-11-20: 7 [IU] via SUBCUTANEOUS
  Administered 2019-11-20: 4 [IU] via SUBCUTANEOUS
  Administered 2019-11-20: 7 [IU] via SUBCUTANEOUS
  Administered 2019-11-20: 4 [IU] via SUBCUTANEOUS
  Administered 2019-11-21: 11 [IU] via SUBCUTANEOUS
  Administered 2019-11-21: 3 [IU] via SUBCUTANEOUS
  Administered 2019-11-21 (×2): 15 [IU] via SUBCUTANEOUS
  Administered 2019-11-22: 4 [IU] via SUBCUTANEOUS
  Administered 2019-11-22: 11 [IU] via SUBCUTANEOUS
  Administered 2019-11-22: 7 [IU] via SUBCUTANEOUS
  Administered 2019-11-22: 3 [IU] via SUBCUTANEOUS
  Administered 2019-11-23: 20 [IU] via SUBCUTANEOUS
  Administered 2019-11-23: 4 [IU] via SUBCUTANEOUS
  Administered 2019-11-23: 15 [IU] via SUBCUTANEOUS
  Administered 2019-11-24: 4 [IU] via SUBCUTANEOUS
  Administered 2019-11-24: 15 [IU] via SUBCUTANEOUS
  Administered 2019-11-24: 20 [IU] via SUBCUTANEOUS
  Filled 2019-11-16 (×31): qty 1

## 2019-11-16 MED ORDER — INSULIN GLARGINE 100 UNIT/ML ~~LOC~~ SOLN
65.0000 [IU] | Freq: Every day | SUBCUTANEOUS | Status: DC
  Administered 2019-11-16 – 2019-11-17 (×2): 65 [IU] via SUBCUTANEOUS
  Filled 2019-11-16 (×4): qty 0.65

## 2019-11-16 MED ORDER — VANCOMYCIN HCL 750 MG/150ML IV SOLN
750.0000 mg | Freq: Two times a day (BID) | INTRAVENOUS | Status: DC
  Administered 2019-11-16 – 2019-11-18 (×5): 750 mg via INTRAVENOUS
  Filled 2019-11-16 (×8): qty 150

## 2019-11-16 NOTE — Progress Notes (Signed)
Pt admitted to floor, RNs assessed for IVs. No luck, IV team consulted. RN asked Attending MD about IV fluids and CBG. RN waiting response

## 2019-11-16 NOTE — Progress Notes (Signed)
PHARMACY - PHYSICIAN COMMUNICATION CRITICAL VALUE ALERT - BLOOD CULTURE IDENTIFICATION (BCID)  Andrea [name] is an 74 y.o. female who presented to [facility] on [facility]/15/2019 with a chief complaint of headache and confusion  Assessment:  Ruling out meningitis, family refused LP.  Blood culture with GPC, BCID = S. pneumoniae  Name of physician (or Provider) Contacted: Dr Serita Grit  Current antibiotics: vanco, amp, cefepime, metronidazole, acyclovir  Changes to prescribed antibiotics recommended:  Recommendations accepted by provider - change to vanco and ceftriaxone.  Obtain ID consult  Results for orders placed or performed during the hospital encounter of 11/15/19  Blood Culture ID Panel (Reflexed) (Collected: 11/15/2019  6:38 PM)  Result Value Ref Range   Enterococcus faecalis NOT DETECTED NOT DETECTED   Enterococcus Faecium NOT DETECTED NOT DETECTED   Listeria monocytogenes NOT DETECTED NOT DETECTED   Staphylococcus species NOT DETECTED NOT DETECTED   Staphylococcus aureus (BCID) NOT DETECTED NOT DETECTED   Staphylococcus epidermidis NOT DETECTED NOT DETECTED   Staphylococcus lugdunensis NOT DETECTED NOT DETECTED   Streptococcus species DETECTED (A) NOT DETECTED   Streptococcus agalactiae NOT DETECTED NOT DETECTED   Streptococcus pneumoniae DETECTED (A) NOT DETECTED   Streptococcus pyogenes NOT DETECTED NOT DETECTED   A.calcoaceticus-baumannii NOT DETECTED NOT DETECTED   Bacteroides fragilis NOT DETECTED NOT DETECTED   Enterobacterales NOT DETECTED NOT DETECTED   Enterobacter cloacae complex NOT DETECTED NOT DETECTED   Escherichia coli NOT DETECTED NOT DETECTED   Klebsiella aerogenes NOT DETECTED NOT DETECTED   Klebsiella oxytoca NOT DETECTED NOT DETECTED   Klebsiella pneumoniae NOT DETECTED NOT DETECTED   Proteus species NOT DETECTED NOT DETECTED   Salmonella species NOT DETECTED NOT DETECTED   Serratia marcescens NOT DETECTED NOT DETECTED   Haemophilus influenzae NOT DETECTED  NOT DETECTED   Neisseria meningitidis NOT DETECTED NOT DETECTED   Pseudomonas aeruginosa NOT DETECTED NOT DETECTED   Stenotrophomonas maltophilia NOT DETECTED NOT DETECTED   Candida albicans NOT DETECTED NOT DETECTED   Candida auris NOT DETECTED NOT DETECTED   Candida glabrata NOT DETECTED NOT DETECTED   Candida krusei NOT DETECTED NOT DETECTED   Candida parapsilosis NOT DETECTED NOT DETECTED   Candida tropicalis NOT DETECTED NOT DETECTED   Cryptococcus neoformans/gattii NOT DETECTED NOT DETECTED    Doreene Eland, PharmD, BCPS.   Work Cell: [phone] 11/16/2019 10:26 AM

## 2019-11-16 NOTE — Progress Notes (Addendum)
Pharmacy Antibiotic Note  Andrea [name] is a 74 y.o. female admitted on 11/15/2019 with sepsis w/ suspicion of meningitis.  Pharmacy has been consulted for vanc/cefepime dosing.  Plan: Patient initially received vanc 1.75g IV x 1 then received another vanc 1g IV that was a duplicate order and so patient received 2.75g IV vanc in the span of a few hours.  Will check a random vanc level in 24 hours to assess level and will monitor renal function closely and bmp daily to assess renal function trend.  Patient is also on ampicillin 2g IV q6h for Listeria coverage in suspect meningitis patient over 28 years of age.  Will continue cefepime 2g IV q12h per CrCl 30 - 60 ml/min and will continue to monitor.  Goal random < 20 mcg/mL and will start vanc regimen if renal function remains stable.  Height: 5' (152.4 cm) Weight: 72.8 kg (160 lb 8 oz) IBW/kg (Calculated) : 45.5  Temp (24hrs), Avg:99.1 F (37.3 C), Min:97.7 F (36.5 C), Max:100.4 F (38 C)  Recent Labs  Lab 11/15/19 1103 11/15/19 1838 11/15/19 2205  WBC 21.5*  --   --   CREATININE 0.98  --   --   LATICACIDVEN  --  1.9 1.9    Estimated Creatinine Clearance: 44.8 mL/min (by C-G formula based on SCr of 0.98 mg/dL).    Allergies  Allergen Reactions  . Metformin And Related Diarrhea  . Sulfa Antibiotics Rash    Thank you for allowing pharmacy to be a part of this patient's care.  Tobie Lords, PharmD, BCPS Clinical Pharmacist 11/16/2019 2:21 AM

## 2019-11-16 NOTE — Progress Notes (Addendum)
Chaplain responded to an order requisition request. Chaplain consulted with RN prior to to seeing Patient.   Chaplain referred to the request being made on behalf of Pt. Chaplain followed lead of Pt and her daughter, Andrea [name] with regards to an emotional-spiritual care conversation.    Pt indicated she would like Advance Care Directive information be left with her. Chaplain left the information with Pt explaining how she could request a Chaplain in the future if she would like to have a discussion about the information.  Andrea [name], MDiv Staff Chaplain-Relief

## 2019-11-16 NOTE — Progress Notes (Signed)
Pharmacy Antibiotic Note  Andrea [name] is a 74 y.o. female admitted on 11/15/2019 with sepsis w/ suspicion of meningitis.  Pharmacy has been consulted for vanc/cefepime dosing.  Today, 11/16/2019  Day #1 antibiotics  9/9 Bcx GPC, BCID S. pneumoniae  SCr stable, WNL  WBC elevated, improved  Per notes, family has refused LP  Plan:  Since renal function stable and vancomycin reagent remains on backorder/allocation,  Will start vancomycin 750mg  IV q12h per nomogram for a goal trough 15-20 mcg/mL  Check trough if vancomycin to continue, this will be based on S. Pneumoniae susceptibilities/MIC (if ceftriaxone susceptible - MIC <=0.5 mcg/mL, then can [place] ceftriaxone alone)   Ceftriaxone 2gm IV q12h  Monitor renal function - BMP already ordered for am   Height: 5' (152.4 cm) Weight: 72.8 kg (160 lb 8 oz) IBW/kg (Calculated) : 45.5  Temp (24hrs), Avg:99.1 F (37.3 C), Min:97.7 F (36.5 C), Max:100.4 F (38 C)  Recent Labs  Lab 11/15/19 1103 11/15/19 1838 11/15/19 2205 11/16/19 0358 11/16/19 0623  WBC 21.5*  --   --   --  17.4*  CREATININE 0.98  --   --  0.96  --   LATICACIDVEN  --  1.9 1.9  --   --     Estimated Creatinine Clearance: 45.8 mL/min (by C-G formula based on SCr of 0.96 mg/dL).    Allergies  Allergen Reactions   Metformin And Related Diarrhea   Sulfa Antibiotics Rash   Antimicrobials this admission:  Ampicillin 9/9 >> 9/10 Cefepime 9/10 x 1 Acyclovir 9/9 >> 9/10 Metronidazole 9/10 x1 Vancomycin 9/9 >> Ceftriaxone 9/10 >>  Dose adjustments this admission:   Microbiology results:  9/9 BCx: GPC BCID S. pneumoniae 9/9 UCx:    Thank you for allowing pharmacy to be a part of this patients care.  Doreene Eland, PharmD, BCPS.   Work Cell: [phone] 11/16/2019 10:46 AM

## 2019-11-16 NOTE — ED Notes (Signed)
Pt eating lunch. Attempted to call report- extension given for call-back.

## 2019-11-16 NOTE — Consult Note (Signed)
NAME: Andrea [name]  DOB: 1945-09-15  MRN: [id number]  Date/Time: 11/16/2019 3:17 PM  REQUESTING PROVIDER: Posey Pronto Subjective:  REASON FOR CONSULT: strep pneumo bacteremia ? Andrea [name] is a 74 y.o.female  with a history of DM, CAD was admitted with altered mental status. As per daughter at bed side and patient he was well on Wednesday and had breakfast with her daughter in a restaurant. She had rhinorrhea that day and daughter thought it was allergy and gave her Zyrtec. PT has had runny nose for a few days. No fever, has had mild headache. On Thursday morning when her grand daughter called her she was not talking straight and appeared confused. They called 911 but patient refused ot go to the hospital. Later the daughters drove her to the hospital. In the ED she was confused . Vitals revealed temp 97.7 ( which later was 100.7) BP 166/89, HR 119, RR 18 Labs revealed a WBC of 21.5, hemoglobin of 13.5 and platelet of 161.  Creatinine was 0.98.  AST was 15 and ALT was 17.  Lactate was 1.9.  Blood cultures were sent she refused lumbar puncture she was started on broad-spectrum antibiotics to be treated as meningitis with cefepime, Vanco, ampicillin, metronidazole and acyclovir was started.  The blood cultures came back positive for Streptococcus pneumonia and her antibiotics are now deescalated to Vanco and ceftriaxone.  She also received 1 dose of Decadron yesterday at 6 PM..   I am asked to see the patient for the bacteremia and possible meningitis. Patient is totally awake alert and oriented.  She does not remember the events of yesterday.  She has some headache but much better than before. As per her daughter was at bedside she has been coughing today but not bringing up any sputum. Patient thinks she has received her pneumococcal vaccination.  The daughter will get the records from [facility].  Patient is fully vaccinated for Covid Past Medical History:  Diagnosis Date  . Bronchitis   .  Chickenpox   . Coronary artery disease   . Depression   . Diabetes mellitus type 2, uncomplicated ([place])   . Diabetes mellitus without complication ([place])   . Diabetic retinopathy ([place])   . Dupuytren contracture   . Frequent urinary tract infections   . GERD (gastroesophageal reflux disease)   . History of hand surgery 2011   left hand  . Hyperlipidemia, unspecified   . Irritable bowel syndrome   . MI (myocardial infarction) ([place]) 1994  . Neuromuscular disorder ([place])    nerve pain in back and legs  . Osteoporosis   . Pneumonia 2014  . Stroke ([place])   . TIA (transient ischemic attack)   . Vitamin D deficiency, unspecified   . Wheezing     Past Surgical History:  Procedure Laterality Date  . CARDIAC CATHETERIZATION  1994  . COLONOSCOPY     x3  . COLONOSCOPY WITH PROPOFOL N/A 02/08/2019   Procedure: COLONOSCOPY WITH PROPOFOL;  Surgeon: [name], MD;  Location: [facility];  Service: Gastroenterology;  Laterality: N/A;  . CORONARY ANGIOPLASTY  1994  . EYE SURGERY Bilateral    cataracts  . HAND SURGERY Left 2011   left hand release of contractures  . HIP FRACTURE SURGERY  11/2013  . PR COLSC FLX W/REMOVAL LESION BY HOT BX FORCEPS   08/21/2015   Procedure: COLONOSCOPY, FLEXIBLE, PROXIMAL TO SPLENIC FLEXURE; W/REMOVAL TUMOR/POLYP/OTHER LESION, HOT BX FORCEP/CAUTE; Surgeon: Carlena Hurl, MD; Location: [facility]; Service: General Surgery  Social History   Socioeconomic History  . Marital status: Married    Spouse name: Roselie Awkward  . Number of children: 3  . Years of education: 8  . Highest education level: 8th grade  Occupational History  . Occupation: retired  Tobacco Use  . Smoking status: Former Smoker    Packs/day: 1.00    Years: 30.00    Pack years: 30.00    Types: Cigarettes    Quit date: 07/17/2012    Years since quitting: 7.3  . Smokeless tobacco: Never Used  . Tobacco comment:    Vaping Use  . Vaping Use: Never used  Substance and Sexual  Activity  . Alcohol use: Not Currently    Alcohol/week: 0.0 standard drinks  . Drug use: No  . Sexual activity: Not on file  Other Topics Concern  . Not on file  Social History Narrative  . Not on file   Social Determinants of Health   Financial Resource Strain: Low Risk   . Difficulty of Paying Living Expenses: Not hard at all  Food Insecurity: No Food Insecurity  . Worried About Charity fundraiser in the Last Year: Never true  . Ran Out of Food in the Last Year: Never true  Transportation Needs: No Transportation Needs  . Lack of Transportation (Medical): No  . Lack of Transportation (Non-Medical): No  Physical Activity: Inactive  . Days of Exercise per Week: 0 days  . Minutes of Exercise per Session: 0 min  Stress: No Stress Concern Present  . Feeling of Stress : Not at all  Social Connections: Moderately Isolated  . Frequency of Communication with Friends and Family: More than three times a week  . Frequency of Social Gatherings with Friends and Family: Once a week  . Attends Religious Services: Never  . Active Member of Clubs or Organizations: No  . Attends Archivist Meetings: Never  . Marital Status: Married  Human resources officer Violence: Not At Risk  . Fear of Current or Ex-Partner: No  . Emotionally Abused: No  . Physically Abused: No  . Sexually Abused: No    Family History  Problem Relation Age of Onset  . Depression Daughter   . Arthritis Daughter        spine  . Plantar fasciitis Daughter   . Anxiety disorder Daughter   . Hyperlipidemia Daughter   . AAA (abdominal aortic aneurysm) Daughter   . Hypertension Mother   . Stroke Mother   . Cataracts Mother   . Depression Mother   . Heart disease Mother   . Asthma Brother   . Diabetes Daughter   . Hyperlipidemia Daughter   . Hypertension Daughter    Allergies  Allergen Reactions  . Metformin And Related Diarrhea  . Sulfa Antibiotics Rash    ? Current Facility-Administered Medications    Medication Dose Route Frequency Provider Last Rate Last Admin  . acetaminophen (TYLENOL) tablet 650 mg  650 mg Oral Q6H PRN Elwyn Reach, MD   650 mg at 11/16/19 0959   Or  . acetaminophen (TYLENOL) suppository 650 mg  650 mg Rectal Q6H PRN Elwyn Reach, MD      . aspirin EC tablet 81 mg  81 mg Oral Daily Elwyn Reach, MD   81 mg at 11/16/19 0952  . atorvastatin (LIPITOR) tablet 80 mg  80 mg Oral QHS Garba, Mohammad L, MD      . cefTRIAXone (ROCEPHIN) 2 g in sodium chloride 0.9 % 100 mL  IVPB  2 g Intravenous Q12H Fritzi Mandes, MD   Stopped at 11/16/19 1221  . enoxaparin (LOVENOX) injection 40 mg  40 mg Subcutaneous Q24H Gala Romney L, MD   40 mg at 11/16/19 0127  . HYDROcodone-acetaminophen (NORCO) 10-325 MG per tablet 1 tablet  1 tablet Oral Q8H PRN Garba, Mohammad L, MD      . insulin glargine (LANTUS) injection 65 Units  65 Units Subcutaneous Q supper Gala Romney L, MD      . lidocaine-prilocaine (EMLA) cream   Topical Once Duffy Bruce, MD      . losartan (COZAAR) tablet 25 mg  25 mg Oral Charlott Rakes, Henderson Newcomer, MD   25 mg at 11/16/19 0952  . metoprolol succinate (TOPROL-XL) 24 hr tablet 25 mg  25 mg Oral Ala Dach, MD   25 mg at 11/16/19 0953  . morphine 2 MG/ML injection 2 mg  2 mg Intravenous Q4H PRN Gala Romney L, MD      . ondansetron (ZOFRAN) tablet 4 mg  4 mg Oral Q6H PRN Elwyn Reach, MD       Or  . ondansetron (ZOFRAN) injection 4 mg  4 mg Intravenous Q6H PRN Gala Romney L, MD      . oxybutynin (DITROPAN-XL) 24 hr tablet 10 mg  10 mg Oral Peter Garter L, MD   10 mg at 11/16/19 0953  . pantoprazole (PROTONIX) EC tablet 40 mg  40 mg Oral Daily Elwyn Reach, MD   40 mg at 11/16/19 0953  . vancomycin (VANCOREADY) IVPB 750 mg/150 mL  750 mg Intravenous Q12H Berton Mount, RPH   Stopped at 11/16/19 1357  . vitamin B-12 (CYANOCOBALAMIN) tablet 1,000 mcg  1,000 mcg Oral Daily Gala Romney L, MD   1,000 mcg at  11/16/19 0953     Abtx:  Anti-infectives (From admission, onward)   Start     Dose/Rate Route Frequency Ordered Stop   11/16/19 1200  ceFEPIme (MAXIPIME) 2 g in sodium chloride 0.9 % 100 mL IVPB  Status:  Discontinued        2 g 200 mL/hr over 30 Minutes Intravenous Every 12 hours 11/16/19 0221 11/16/19 1011   11/16/19 1200  vancomycin (VANCOREADY) IVPB 750 mg/150 mL        750 mg 150 mL/hr over 60 Minutes Intravenous Every 12 hours 11/16/19 1041     11/16/19 1015  cefTRIAXone (ROCEPHIN) 2 g in sodium chloride 0.9 % 100 mL IVPB        2 g 200 mL/hr over 30 Minutes Intravenous Every 12 hours 11/16/19 1011     11/16/19 0700  vancomycin (VANCOREADY) IVPB 750 mg/150 mL  Status:  Discontinued        750 mg 150 mL/hr over 60 Minutes Intravenous Every 12 hours 11/15/19 1828 11/15/19 1834   11/16/19 0215  vancomycin variable dose per unstable renal function (pharmacist dosing)  Status:  Discontinued         Does not apply See admin instructions 11/16/19 0215 11/16/19 1041   11/16/19 0000  ceFEPIme (MAXIPIME) 2 g in sodium chloride 0.9 % 100 mL IVPB        2 g 200 mL/hr over 30 Minutes Intravenous  Once 11/15/19 2354 11/16/19 0033   11/16/19 0000  metroNIDAZOLE (FLAGYL) IVPB 500 mg  Status:  Discontinued        500 mg 100 mL/hr over 60 Minutes Intravenous Every 8 hours 11/15/19 2354 11/16/19 1011   11/16/19  0000  vancomycin (VANCOCIN) IVPB 1000 mg/200 mL premix  Status:  Discontinued        1,000 mg 200 mL/hr over 60 Minutes Intravenous  Once 11/15/19 2354 11/16/19 0013   11/15/19 2200  acyclovir (ZOVIRAX) 700 mg in dextrose 5 % 100 mL IVPB  Status:  Discontinued        700 mg 114 mL/hr over 60 Minutes Intravenous Every 12 hours 11/15/19 2040 11/16/19 1011   11/15/19 2000  ampicillin (OMNIPEN) 2 g in sodium chloride 0.9 % 100 mL IVPB  Status:  Discontinued        2 g 300 mL/hr over 20 Minutes Intravenous Every 4 hours 11/15/19 1813 11/15/19 1921   11/15/19 1930  ampicillin (OMNIPEN) 2 g in  sodium chloride 0.9 % 100 mL IVPB  Status:  Discontinued        2 g 300 mL/hr over 20 Minutes Intravenous Every 6 hours 11/15/19 1921 11/16/19 1011   11/15/19 1900  vancomycin (VANCOREADY) IVPB 1750 mg/350 mL        1,750 mg 175 mL/hr over 120 Minutes Intravenous  Once 11/15/19 1828 11/15/19 2142   11/15/19 1800  cefTRIAXone (ROCEPHIN) 2 g in sodium chloride 0.9 % 100 mL IVPB        2 g 200 mL/hr over 30 Minutes Intravenous  Once 11/15/19 1752 11/15/19 1921      REVIEW OF SYSTEMS:  Const: negative fever, negative chills, negative weight loss Eyes: negative diplopia or visual changes, negative eye pain ENT: + coryza, negative sore throat Resp: cough, hemoptysis, dyspnea Cards: negative for chest pain, palpitations, lower extremity edema GU: negative for frequency, dysuria and hematuria GI: Negative for abdominal pain, diarrhea, bleeding, constipation Skin: negative for rash and pruritus Heme: negative for easy bruising and gum/nose bleeding MS: Has pain in the left hip.  She uses wheelchair-.  Can walk with a walker.  Awaiting surgery Neurolo:+ headaches, confusion psych: negative for feelings of anxiety, depression  Endocrine: Diabetes Allergy/Immunology-self on Metformin Objective:  VITALS:  BP (!) 127/48 (BP Location: Left Arm)   Pulse 87   Temp 97.7 F (36.5 C) (Oral)   Resp 18   Ht 5' (1.524 m)   Wt 72.8 kg   SpO2 100%   BMI 31.35 kg/m  PHYSICAL EXAM:  General: Alert, cooperative, no distress, appears stated age.  Oriented x5 Head: Normocephalic, without obvious abnormality, atraumatic. Eyes: Conjunctivae clear, anicteric sclerae. Pupils are equal ENT Nares normal. No drainage or sinus tenderness. Lips, mucosa, and tongue normal. No Thrush, upper dentures absent lower teeth Neck: Supple, symmetrical, no adenopathy, thyroid: non tender no carotid bruit and no JVD. Back: No CVA tenderness. Lungs: Bilateral air entry.  Crypts in the bases. Heart: Regular rate and  rhythm, no murmur, rub or gallop. Abdomen: Soft, non-tender,not distended. Bowel sounds normal. No masses Extremities: Left thigh laterally scar  skin: Thin Lymph: Cervical, supraclavicular normal. Neurologic: Grossly non-focal Pertinent Labs Lab Results CBC    Component Value Date/Time   WBC 17.4 (H) 11/16/2019 0623   RBC 3.89 11/16/2019 0623   HGB 11.9 (L) 11/16/2019 0623   HGB 13.1 12/28/2018 1626   HCT 33.8 (L) 11/16/2019 0623   HCT 38.7 12/28/2018 1626   PLT 143 (L) 11/16/2019 0623   PLT 170 12/28/2018 1626   MCV 86.9 11/16/2019 0623   MCV 90 12/28/2018 1626   MCV 91 01/01/2014 0643   MCH 30.6 11/16/2019 0623   MCHC 35.2 11/16/2019 0623   RDW 13.6 11/16/2019 9892  RDW 13.2 12/28/2018 1626   RDW 16.5 (H) 01/01/2014 0643   LYMPHSABS 0.7 11/16/2019 0623   LYMPHSABS 2.9 12/28/2018 1626   LYMPHSABS 1.2 01/01/2014 0643   MONOABS 0.7 11/16/2019 0623   MONOABS 0.6 01/01/2014 0643   EOSABS 0.0 11/16/2019 0623   EOSABS 0.2 12/28/2018 1626   EOSABS 1.1 (H) 01/01/2014 0643   BASOSABS 0.0 11/16/2019 0623   BASOSABS 0.0 12/28/2018 1626   BASOSABS 0.0 01/01/2014 0643    CMP Latest Ref Rng & Units 11/16/2019 11/15/2019 10/23/2019  Glucose 70 - 99 mg/dL 351(H) 269(H) 177(H)  BUN 8 - 23 mg/dL 21 21 25(H)  Creatinine 0.44 - 1.00 mg/dL 0.96 0.98 0.98  Sodium 135 - 145 mmol/L 137 133(L) 138  Potassium 3.5 - 5.1 mmol/L 4.0 4.2 3.9  Chloride 98 - 111 mmol/L 102 98 101  CO2 22 - 32 mmol/L 21(L) 22 26  Calcium 8.9 - 10.3 mg/dL 8.6(L) 9.6 9.4  Total Protein 6.5 - 8.1 g/dL 6.2(L) 7.4 -  Total Bilirubin 0.3 - 1.2 mg/dL 1.9(H) 2.1(H) -  Alkaline Phos 38 - 126 U/L 80 102 -  AST 15 - 41 U/L 13(L) 15 -  ALT 0 - 44 U/L 12 17 -      Microbiology: Recent Results (from the past 240 hour(s))  Blood culture (routine x 2)     Status: None (Preliminary result)   Collection Time: 11/15/19  6:38 PM   Specimen: BLOOD  Result Value Ref Range Status   Specimen Description   Final    BLOOD LEFT  ANTECUBITAL Performed at [facility], [place] [address], [place], [place] [address]    Special Requests   Final    BOTTLES DRAWN AEROBIC AND ANAEROBIC Blood Culture results may not be optimal due to an excessive volume of blood received in culture bottles   Culture  Setup Time   Final    Organism ID to follow [place] AND ANAEROBIC BOTTLES CRITICAL RESULT CALLED TO, READ BACK BY AND VERIFIED WITH: SUSAN WATSON AT 0820 ON 11/16/19 SNG Performed at [facility], [address], [place], [place] [address]    Culture [facility] POSITIVE COCCI  Final   Report Status PENDING  Incomplete  SARS Coronavirus 2 by RT PCR (hospital order, performed in [place] hospital lab) Nasopharyngeal Nasopharyngeal Swab     Status: None   Collection Time: 11/15/19  6:38 PM   Specimen: Nasopharyngeal Swab  Result Value Ref Range Status   SARS Coronavirus 2 NEGATIVE NEGATIVE Final    Comment: (NOTE) SARS-CoV-2 target nucleic acids are NOT DETECTED.  The SARS-CoV-2 RNA is generally detectable in upper and lower respiratory specimens during the acute phase of infection. The lowest concentration of SARS-CoV-2 viral copies this assay can detect is 250 copies / mL. A negative result does not preclude SARS-CoV-2 infection and should not be used as the sole basis for treatment or other patient management decisions.  A negative result may occur with improper specimen collection / handling, submission of specimen other than nasopharyngeal swab, presence of viral mutation(s) within the areas targeted by this assay, and inadequate number of viral copies (<250 copies / mL). A negative result must be combined with clinical observations, patient history, and epidemiological information.  Fact Sheet for Patients:   StrictlyIdeas.no  Fact Sheet for Healthcare Providers: BankingDealers.co.za  This test is not yet approved or  cleared by  the [place] FDA and has been authorized for detection and/or  diagnosis of SARS-CoV-2 by FDA under an Emergency Use Authorization (EUA).  This EUA will remain in effect (meaning this test can be used) for the duration of the COVID-19 declaration under Section 564(b)(1) of the Act, 21 U.S.C. section 360bbb-3(b)(1), unless the authorization is terminated or revoked sooner.  Performed at [facility], [address] [place], [place] [address]   Blood Culture ID Panel (Reflexed)     Status: Abnormal   Collection Time: 11/15/19  6:38 PM  Result Value Ref Range Status   Enterococcus faecalis NOT DETECTED NOT DETECTED Final   Enterococcus Faecium NOT DETECTED NOT DETECTED Final   Listeria monocytogenes NOT DETECTED NOT DETECTED Final   Staphylococcus species NOT DETECTED NOT DETECTED Final   Staphylococcus aureus (BCID) NOT DETECTED NOT DETECTED Final   Staphylococcus epidermidis NOT DETECTED NOT DETECTED Final   Staphylococcus lugdunensis NOT DETECTED NOT DETECTED Final   Streptococcus species DETECTED (A) NOT DETECTED Final    Comment: CRITICAL RESULT CALLED TO, READ BACK BY AND VERIFIED WITH: SUSAN WATSON AT 0820 ON 11/16/19 SNG    Streptococcus agalactiae NOT DETECTED NOT DETECTED Final   Streptococcus pneumoniae DETECTED (A) NOT DETECTED Final    Comment: CRITICAL RESULT CALLED TO, READ BACK BY AND VERIFIED WITH: SUSAN WATSON AT 0820 ON 11/16/19 SNG    Streptococcus pyogenes NOT DETECTED NOT DETECTED Final   A.calcoaceticus-baumannii NOT DETECTED NOT DETECTED Final   Bacteroides fragilis NOT DETECTED NOT DETECTED Final   Enterobacterales NOT DETECTED NOT DETECTED Final   Enterobacter cloacae complex NOT DETECTED NOT DETECTED Final   Escherichia coli NOT DETECTED NOT DETECTED Final   Klebsiella aerogenes NOT DETECTED NOT DETECTED Final   Klebsiella oxytoca NOT DETECTED NOT DETECTED Final   Klebsiella pneumoniae NOT DETECTED NOT DETECTED Final   Proteus species NOT  DETECTED NOT DETECTED Final   Salmonella species NOT DETECTED NOT DETECTED Final   Serratia marcescens NOT DETECTED NOT DETECTED Final   Haemophilus influenzae NOT DETECTED NOT DETECTED Final   Neisseria meningitidis NOT DETECTED NOT DETECTED Final   Pseudomonas aeruginosa NOT DETECTED NOT DETECTED Final   Stenotrophomonas maltophilia NOT DETECTED NOT DETECTED Final   Candida albicans NOT DETECTED NOT DETECTED Final   Candida auris NOT DETECTED NOT DETECTED Final   Candida glabrata NOT DETECTED NOT DETECTED Final   Candida krusei NOT DETECTED NOT DETECTED Final   Candida parapsilosis NOT DETECTED NOT DETECTED Final   Candida tropicalis NOT DETECTED NOT DETECTED Final   Cryptococcus neoformans/gattii NOT DETECTED NOT DETECTED Final    Comment: Performed at [facility], [address] [place], [place] [address]    IMAGING RESULTS: CT head  nothing acute I have personally reviewed the films ? Impression/Recommendation ? 74 year old female with history of diabetes mellitus presents with headache and confusion Encephalopathy with pneumococcal bacteremia raises suspicion for pneumococcal meningitis.  But patient is completely awake alert and oriented x5 today with some headache.  She may have had a mild form of meningitis.  She received 1 dose of Decadron.  I do not think she would need further doses. Agree with vancomycin and ceftriaxone currently.  Await MIC of ceftriaxone.  If l highly susceptible to  ceftriaxone then we can discontinue vancomycin and continue every 12 ceftriaxone for  10 -14 days. Unclear source but could be from her upper respiratory tract as she had rhinorrhea for a few days.  There is no obvious evidence of pneumonia  Repeat blood cultures to look for clearance Will get 2D echo  to rule out endocarditis.  May need TEE. If she has not received her pneumococcal vaccination will get it at after completion of treatment.  Diabetes mellitus management as per  primary team    ? ___________________________________________________ Discussed with patient, her daughter and pharmacist  Note:  This document was prepared using Dragon voice recognition software and may include unintentional dictation errors.

## 2019-11-16 NOTE — Plan of Care (Signed)

## 2019-11-16 NOTE — Progress Notes (Addendum)
Andrea [name] at [place] NAME: Andrea [name]    MR#:  [id number]  DATE OF BIRTH:  [birth year]  SUBJECTIVE:  patient came into the hospital after family found she was confused. She had some fever and chills. Currently afebrile. More awake alert per daughter Andrea [name] in the room denies any shortness of breath. Has some chronic back pain.  REVIEW OF SYSTEMS:   Review of Systems  Constitutional: Negative for chills, fever and weight loss.  HENT: Negative for ear discharge, ear pain and nosebleeds.   Eyes: Negative for blurred vision, pain and discharge.  Respiratory: Negative for sputum production, shortness of breath, wheezing and stridor.   Cardiovascular: Negative for chest pain, palpitations, orthopnea and PND.  Gastrointestinal: Negative for abdominal pain, diarrhea, nausea and vomiting.  Genitourinary: Negative for frequency and urgency.  Musculoskeletal: Positive for back pain. Negative for joint pain.  Neurological: Positive for weakness. Negative for sensory change, speech change and focal weakness.  Psychiatric/Behavioral: Negative for depression and hallucinations. The patient is not nervous/anxious.    Tolerating Diet:yes Tolerating PT: pending  DRUG ALLERGIES:   Allergies  Allergen Reactions  . Metformin And Related Diarrhea  . Sulfa Antibiotics Rash    VITALS:  Blood pressure (!) 141/65, pulse 93, temperature 98.2 F (36.8 C), temperature source Oral, resp. rate 16, height 5' (1.524 m), weight 72.8 kg, SpO2 97 %.  PHYSICAL EXAMINATION:   Physical Exam  GENERAL:  74 y.o.-year-old patient lying in the bed with no acute distress. Appears chronically ill. HEENT: Head atraumatic, normocephalic. Oropharynx and nasopharynx clear.  LUNGS: Normal breath sounds bilaterally, no wheezing, rales, rhonchi. No use of accessory muscles of respiration.  CARDIOVASCULAR: S1, S2 normal. No murmurs, rubs, or gallops.  ABDOMEN: Soft, nontender,  nondistended. Bowel sounds present. No organomegaly or mass.  EXTREMITIES: No cyanosis, clubbing or edema b/l. -- Muscle wasting in both lower extremity NEUROLOGIC: Cranial nerves II through XII are intact. No focal Motor or sensory deficits b/l. Generalized weakness PSYCHIATRIC:  patient is alert and oriented x 3.  SKIN: No obvious rash, lesion, or ulcer.   dry skin     LABORATORY PANEL:  CBC Recent Labs  Lab 11/16/19 0623  WBC 17.4*  HGB 11.9*  HCT 33.8*  PLT 143*    Chemistries  Recent Labs  Lab 11/16/19 0358  NA 137  K 4.0  CL 102  CO2 21*  GLUCOSE 351*  BUN 21  CREATININE 0.96  CALCIUM 8.6*  AST 13*  ALT 12  ALKPHOS 80  BILITOT 1.9*   Cardiac Enzymes No results for input(s): TROPONINI in the last 168 hours. RADIOLOGY:  CT Head Wo Contrast  Result Date: 11/15/2019 CLINICAL DATA:  Headache EXAM: CT HEAD WITHOUT CONTRAST TECHNIQUE: Contiguous axial images were obtained from the base of the skull through the vertex without intravenous contrast. COMPARISON:  None. FINDINGS: Brain: No evidence of acute infarction, hemorrhage, hydrocephalus, extra-axial collection or mass lesion/mass effect. Mild age related cerebral volume loss. Vascular: Atherosclerotic calcifications involving the large vessels of the skull base. No unexpected hyperdense vessel. Skull: Normal. Negative for fracture or focal lesion. Sinuses/Orbits: Mild mucosal thickening within the left ethmoid air cells. Remaining visualized paranasal sinuses and mastoid air cells are clear. Orbital structures intact. Other: None. IMPRESSION: 1. No acute intracranial findings. 2. Mild left ethmoid sinus disease. Electronically Signed   By: Davina Poke D.O.   On: 11/15/2019 11:26   DG Chest Port 1 View  Result Date: 11/15/2019  CLINICAL DATA:  Onset headache and altered mental status today. EXAM: PORTABLE CHEST 1 VIEW COMPARISON:  Single-view of the chest 01/01/2017. FINDINGS: Lung volumes are low with mild left  basilar atelectasis. Cardiomegaly and vascular congestion. No consolidative process, pneumothorax or effusion. Aortic atherosclerosis noted. No acute or focal bony abnormality. IMPRESSION: Cardiomegaly and vascular congestion. Mild basilar atelectasis in a low volume chest. Aortic Atherosclerosis (ICD10-I70.0). Electronically Signed   By: Inge Rise M.D.   On: 11/15/2019 12:12   ASSESSMENT AND PLAN:  Andrea [name] is a 74 y.o. female with medical history significant of coronary artery disease, diabetes type 2, GERD, hyperlipidemia, recurrent UTI and pneumonia as well as depression who was apparently fine yesterday.  She has history of CVA and TIA in the past but no issues lately until today when family found her confused. Family reported that this happened suddenly this morning complaint of headache fever and chills   1 Sepsis POA with Acute metabolic encephalopathy--improving -patient presented with confusion, fever, tachycardia, tachypnea and leukocytosis. Etiology unclear could be UTI/? Suspected meningitis (clinically does not appear to be) -chest x-ray no pneumonia -blood culture  ([facility])positive for strep pneumonia -ID consultation requested -urine culture pending -COVID negative-lactic acid 1.9 -Pro calcitonin .35 -leukocytosis came in with white count of 21K--17.5K   #2 altered mental status: Again this could be secondary to sepsis.  -Mentation much improved  -no neck stiffness    #3 diabetes type II uncontrolled hyperglycemia with peripheral arterial disease -continue sliding scale insulin and Lantus -diabetes coordinator input appreciated  #4 back pain with chronic spinal stenosis: Patient has chronic pain taking hydrocodone.  I will continue with home regimen to avoid withdrawal symptoms.  #5 COPD: No acute exacerbation.  Continue treatment. -Wears  chronic oxygen at home  #6 coronary artery disease: Appears stable close to baseline.   -continue metoprolol,  losartan, aspirin, statins   DVT prophylaxis: Lovenox Code Status: Full code Family Communication: Daughter Andrea [name] at bedside Disposition Plan: To be determined Consults called:  infectious disease  admission status: Inpatient  Severity of Illness: patient is being admitted for sepsis. Blood cultures are positive. Currently getting IV antibiotics. ID consultation pending.     TOTAL TIME TAKING CARE OF THIS PATIENT: *35* minutes.  >50% time spent on counselling and coordination of care  Note: This dictation was prepared with Dragon dictation along with smaller phrase technology. Any transcriptional errors that result from this process are unintentional.  Fritzi Mandes M.D    [facility]   CC: Primary care physician; Mar Daring, PA-CPatient ID: Andrea [name], female   DOB: 01/13/1946, 74 y.o.   MRN: [id number]

## 2019-11-16 NOTE — Progress Notes (Signed)
Inpatient Diabetes Program Recommendations  AACE/ADA: New Consensus Statement on Inpatient Glycemic Control (2015)  Target Ranges:  Prepandial:   less than 140 mg/dL      Peak postprandial:   less than 180 mg/dL (1-2 hours)      Critically ill patients:  140 - 180 mg/dL   Lab Results  Component Value Date   GLUCAP 384 (H) 11/16/2019   HGBA1C 8.1 (A) 10/11/2019    Review of Glycemic Control  Diabetes history: DM2 Outpatient Diabetes medications: Lantus 65 units bid + Trulicity 1.5 mg q week Current orders for Inpatient glycemic control: Lantus 65 units  Inpatient Diabetes Program Recommendations:   -Glycemic control order set with Novolog moderate correction 0-15 units tid +hs 0-5 units if patient is eating Will follow during hospitalization.  Thank you, Nani Gasser. Minette Manders, RN, MSN, CDE  Diabetes Coordinator Inpatient Glycemic Control Team Team Pager [phone] (8am-5pm) 11/16/2019 11:50 AM

## 2019-11-16 NOTE — Progress Notes (Signed)
CODE SEPSIS - PHARMACY COMMUNICATION  **Broad Spectrum Antibiotics should be administered within 1 hour of Sepsis diagnosis**  Time Code Sepsis Called/Page Received: 1752  Antibiotics Ordered: vanc/cefepime  Time of 1st antibiotic administration: 1851  Additional action taken by pharmacy:   If necessary, Name of Provider/Nurse Contacted:     Tobie Lords ,PharmD Clinical Pharmacist  11/16/2019  12:14 AM

## 2019-11-17 DIAGNOSIS — G9341 Metabolic encephalopathy: Secondary | ICD-10-CM

## 2019-11-17 DIAGNOSIS — A403 Sepsis due to Streptococcus pneumoniae: Principal | ICD-10-CM

## 2019-11-17 DIAGNOSIS — G039 Meningitis, unspecified: Secondary | ICD-10-CM

## 2019-11-17 LAB — GLUCOSE, CAPILLARY
Glucose-Capillary: 264 mg/dL — ABNORMAL HIGH (ref 70–99)
Glucose-Capillary: 176 mg/dL — ABNORMAL HIGH (ref 70–99)
Glucose-Capillary: 256 mg/dL — ABNORMAL HIGH (ref 70–99)
Glucose-Capillary: 324 mg/dL — ABNORMAL HIGH (ref 70–99)
Glucose-Capillary: 249 mg/dL — ABNORMAL HIGH (ref 70–99)

## 2019-11-17 MED ORDER — GLUCERNA SHAKE PO LIQD
237.0000 mL | ORAL | Status: DC
  Administered 2019-11-17 – 2019-11-24 (×7): 237 mL via ORAL

## 2019-11-17 MED ORDER — LORATADINE 10 MG PO TABS
10.0000 mg | ORAL_TABLET | Freq: Once | ORAL | Status: AC
  Administered 2019-11-17: 10 mg via ORAL
  Filled 2019-11-17: qty 1

## 2019-11-17 NOTE — Care Plan (Signed)
Pt weaned off oxygen and on room air at this time. Pt is AxOx4. She is incontinent of urine. Pt sitting in chair after working with PT. Pts daughter states her mom and patient states she takes Norco at home for left hip pain. She has limited movement with that hip r/t the pain. Pt takes pills whole. Pt sugars have been high this shift and covered with insulin. Waiting (see messages to pharmacy) for lantus as this was an issue last night. Pt has bath today on this shift and linens changed. Ice pitcher refilled. New IV placed.

## 2019-11-17 NOTE — Progress Notes (Signed)
Consult was placed to the IV Therapist for a new iv site;  Pt with extremely poor veins; left arm assessed w ultrasound; was not able to thread catheter in L UA; able to place 20ga in right lat forearm;  Daughter at bedside; pt rather anxious;

## 2019-11-17 NOTE — Progress Notes (Signed)
[facility] [place] NAME: Andrea [name]    MR#:  [id number]  DATE OF BIRTH:  June 16, 1945  SUBJECTIVE:  patient came into the hospital after family found she was confused. She had some fever and chills.  Currently afebrile. More awake alert and talkative today [name] in the room denies any shortness of breath. Has some chronic back pain. Mild HA  REVIEW OF SYSTEMS:   Review of Systems  Constitutional: Negative for chills, fever and weight loss.  HENT: Negative for ear discharge, ear pain and nosebleeds.   Eyes: Negative for blurred vision, pain and discharge.  Respiratory: Negative for sputum production, shortness of breath, wheezing and stridor.   Cardiovascular: Negative for chest pain, palpitations, orthopnea and PND.  Gastrointestinal: Negative for abdominal pain, diarrhea, nausea and vomiting.  Genitourinary: Negative for frequency and urgency.  Musculoskeletal: Positive for back pain. Negative for joint pain.  Neurological: Positive for weakness. Negative for sensory change, speech change and focal weakness.  Psychiatric/Behavioral: Negative for depression and hallucinations. The patient is not nervous/anxious.    Tolerating Diet:yes Tolerating PT: pending  DRUG ALLERGIES:   Allergies  Allergen Reactions  . Metformin And Related Diarrhea  . Sulfa Antibiotics Rash    VITALS:  Blood pressure (!) 128/47, pulse 82, temperature 97.7 F (36.5 C), temperature source Oral, resp. rate 16, height 5' (1.524 m), weight 72.8 kg, SpO2 91 %.  PHYSICAL EXAMINATION:   Physical Exam  GENERAL:  74 y.o.-year-old patient lying in the bed with no acute distress. Appears chronically ill. HEENT: Head atraumatic, normocephalic. Oropharynx and nasopharynx clear.  LUNGS: Normal breath sounds bilaterally, no wheezing, rales, rhonchi. No use of accessory muscles of respiration.  CARDIOVASCULAR: S1, S2 normal. No murmurs, rubs, or gallops.   ABDOMEN: Soft, nontender, nondistended. Bowel sounds present. No organomegaly or mass.  EXTREMITIES: No cyanosis, clubbing or edema b/l. -- Muscle wasting in both lower extremity NEUROLOGIC: Cranial nerves II through XII are intact. No focal Motor or sensory deficits b/l. Generalized weakness PSYCHIATRIC:  patient is alert and oriented x 3.  SKIN: No obvious rash, lesion, or ulcer.   dry skin     LABORATORY PANEL:  CBC Recent Labs  Lab 11/16/19 0623  WBC 17.4*  HGB 11.9*  HCT 33.8*  PLT 143*    Chemistries  Recent Labs  Lab 11/16/19 0358  NA 137  K 4.0  CL 102  CO2 21*  GLUCOSE 351*  BUN 21  CREATININE 0.96  CALCIUM 8.6*  AST 13*  ALT 12  ALKPHOS 80  BILITOT 1.9*   Cardiac Enzymes No results for input(s): TROPONINI in the last 168 hours. RADIOLOGY:  No results found. ASSESSMENT AND PLAN:  FANNYE MYER is a 74 y.o. female with medical history significant of coronary artery disease, diabetes type 2, GERD, hyperlipidemia, recurrent UTI and pneumonia as well as depression who was apparently fine yesterday.  She has history of CVA and TIA in the past but no issues lately until today when family found her confused. Family reported that this happened suddenly this morning complaint of headache fever and chills   1 Sepsis POA with Acute metabolic encephalopathy--improving -patient presented with confusion, fever, tachycardia, tachypnea and leukocytosis. Etiology unclear could be Milder form of Pneumococcal meningitis  -chest x-ray no pneumonia -blood culture 11/15/2019 ([facility])positive for strep pneumonia --repeat blood cultures 11/16/2019--so far negative.  -ID consultation requested--appreciate input. Patient will need IV antibiotic for 14 days. Discussed  with ID  when patient will be appropriate for PICC line placement.--wait1-2 days -urine culture negative -COVID negative, respiratory PCR negative -lactic acid 1.9 -Pro calcitonin 0.35 -leukocytosis came in with  white count of 21K--17.5K   #2 altered mental status/Acute Encephlaopathy - Again this could be secondary to sepsis.  -Mentation much improved  -no neck stiffness    #3 Diabetes type II uncontrolled hyperglycemia with peripheral arterial disease -continue sliding scale insulin and Lantus -diabetes coordinator input appreciated  #4 Back pain with chronic spinal stenosis:  -Patient has chronic pain taking hydrocodone. - I will continue with home regimen to avoid withdrawal symptoms.  #5 COPD: No acute exacerbation. stable -Wears chronic oxygen at home  #6 coronary artery disease: -continue metoprolol, losartan, aspirin, statins   DVT prophylaxis: Lovenox Code Status: Full code Family Communication: [name] at bedside Disposition Plan: To be determined Consults called:  infectious disease  admission status: Inpatient  Severity of Illness: patient is being admitted for sepsis. Blood cultures are positive. Currently getting IV antibiotics. Patient will stay couple more days to determine course of IV antibiotic and will need PICC line placement. PT consulted pending TOC for discharge planning     TOTAL TIME TAKING CARE OF THIS PATIENT: *35* minutes.  >50% time spent on counselling and coordination of care  Note: This dictation was prepared with Dragon dictation along with smaller phrase technology. Any transcriptional errors that result from this process are unintentional.  Fritzi Mandes M.D    [facility]   CC: Primary care physician; Mar Daring, PA-CPatient ID: [name], female   DOB: 09-Mar-1945, 74 y.o.   MRN: [id number]

## 2019-11-17 NOTE — Evaluation (Signed)
Physical Therapy Evaluation Patient Details Name: Andrea [name] MRN: [id number] DOB: 1945-11-30 Today's Date: 11/17/2019   History of Present Illness  Andrea [name] is a 74 y/o female who was admitted for AMS and headache. PMH includes CAD, DM II, GERD, HLD, recurrent UTI, PNA, depression, CVA, and TIA.  Clinical Impression  Pt lying in bed, with daughter at bedside, and agreeable to PT session. Pt on 2L O2 via nasal cannula at 98% saturation. Pt performed bed mobility with min A for truncal elevation as pt pulled on clinician to come into sitting. Pt stood with CGA and ambulated 2 feet using RW with CGA for steadying. Pt performed all activities on room air and O2 sats remained above 96%. PT and daughter educated on supplemental oxygen usage and what the number readings mean. Pt performed standing and sitting therex with verbal cues for correct technqiue with good carryover of cues. Pt with deficits in weakness, endurance, balance, and functional mobility. Pt would benefit from skilled PT during acute stay and recommend HHPT at discharge to optimize return to PLOF and maximize functional mobility. Pt left on room air at 98%.    Follow Up Recommendations Home health PT;Supervision for mobility/OOB    Equipment Recommendations  None recommended by PT;Other (comment) (pt has all equipment already)    Recommendations for Other Services       Precautions / Restrictions Precautions Precautions: Fall Restrictions Weight Bearing Restrictions: No      Mobility  Bed Mobility Overal bed mobility: Needs Assistance Bed Mobility: Supine to Sit     Supine to sit: Min assist;HOB elevated     General bed mobility comments: Pt able to maneuver BLE to and over edge of bed without assistance and is able to pull on clinician's hand to come into sitting from elevated HOB.  Transfers Overall transfer level: Needs assistance Equipment used: Rolling walker (2 wheeled) Transfers: Sit to/from  Stand Sit to Stand: Min guard         General transfer comment: CGA for sit <> stand transfer for boost into standing and steadying. Verbal cues for hand and foot placement during transfer.  Ambulation/Gait Ambulation/Gait assistance: Min guard Gait Distance (Feet): 2 Feet Assistive device: Rolling walker (2 wheeled)   Gait velocity: decreased   General Gait Details: Pt ambulated 2 feet from bed to recliner chair using RW with CGA for steadying. Initially required min A secondary to patient's increased fear of falling.  Stairs            Wheelchair Mobility    Modified Rankin (Stroke Patients Only)       Balance Overall balance assessment: Needs assistance Sitting-balance support: Feet supported Sitting balance-Leahy Scale: Good Sitting balance - Comments: one slight anterior LOB however pt able to self correct; SBA for safety   Standing balance support: Single extremity supported;Bilateral upper extremity supported;During functional activity Standing balance-Leahy Scale: Fair Standing balance comment: CGA for static standing with BUE on RW then pt able to remove one hand to point to something with no overt LOB                             Pertinent Vitals/Pain Pain Assessment: 0-10 Pain Score: 3  Pain Location: behind bilateral knees Pain Descriptors / Indicators: Discomfort Pain Intervention(s): Monitored during session    Home Living Family/patient expects to be discharged to:: Private residence Living Arrangements: Spouse/significant other Available Help at Discharge: Family;Available PRN/intermittently Type of Home:  Mobile home Home Access: Ramped entrance;Other (comment) (has one step at top of ramp)     Home Layout: One level Home Equipment: Walker - 4 wheels;Walker - 2 wheels;Tub bench;Wheelchair - manual;Toilet riser      Prior Function Level of Independence: Independent with assistive device(s)         Comments: Pt reports  primary ambulation in manual wheelchair and is able to transfer to toilet utilizing sink. She reports no falls in last 6 months and states that her husband helps her to get baths.     Hand Dominance        Extremity/Trunk Assessment   Upper Extremity Assessment Upper Extremity Assessment: Generalized weakness (grossly 3+ to 4-/5 bilaterally)    Lower Extremity Assessment Lower Extremity Assessment: Generalized weakness (grossly 3+ to 4-/5 bilaterally)       Communication   Communication: No difficulties  Cognition Arousal/Alertness: Awake/alert Behavior During Therapy: WFL for tasks assessed/performed Overall Cognitive Status: Within Functional Limits for tasks assessed                                 General Comments: Pt A&O x 4.      General Comments      Exercises Other Exercises Other Exercises: standing marches x 20 with BUE on RW and CGA for steadying; hip ab/add x 10 bilat Other Exercises: education provided on use of supplemental oxygen; pt with saturations at 96% and above so removed 2L O2   Assessment/Plan    PT Assessment Patient needs continued PT services  PT Problem List Decreased strength;Decreased activity tolerance;Decreased balance;Decreased mobility;Pain       PT Treatment Interventions DME instruction;Gait training;Stair training;Functional mobility training;Therapeutic activities;Therapeutic exercise;Balance training;Patient/family education    PT Goals (Current goals can be found in the Care Plan section)  Acute Rehab PT Goals Patient Stated Goal: none stated PT Goal Formulation: With patient Time For Goal Achievement: 12/01/19 Potential to Achieve Goals: Good    Frequency Min 2X/week   Barriers to discharge        Co-evaluation               AM-PAC PT "6 Clicks" Mobility  Outcome Measure Help needed turning from your back to your side while in a flat bed without using bedrails?: None Help needed moving from  lying on your back to sitting on the side of a flat bed without using bedrails?: A Lot Help needed moving to and from a bed to a chair (including a wheelchair)?: A Little Help needed standing up from a chair using your arms (e.g., wheelchair or bedside chair)?: A Little Help needed to walk in hospital room?: A Little Help needed climbing 3-5 steps with a railing? : A Little 6 Click Score: 18    End of Session Equipment Utilized During Treatment: Gait belt Activity Tolerance: Patient tolerated treatment well Patient left: in chair;with call bell/phone within reach;with chair alarm set;with family/visitor present Nurse Communication: Mobility status;Other (comment) (O2 sats and removal of nasal cannula) PT Visit Diagnosis: Unsteadiness on feet (R26.81);Other abnormalities of gait and mobility (R26.89);Muscle weakness (generalized) (M62.81);Pain Pain - Right/Left:  (bilateral) Pain - part of body: Knee    Time: 2202-5427 PT Time Calculation (min) (ACUTE ONLY): 59 min   Charges:              Vale Haven, SPT  Vale Haven 11/17/2019, 3:56 PM

## 2019-11-18 ENCOUNTER — Other Ambulatory Visit: Payer: Self-pay | Admitting: Physician Assistant

## 2019-11-18 ENCOUNTER — Inpatient Hospital Stay (HOSPITAL_COMMUNITY)
Admit: 2019-11-18 | Discharge: 2019-11-18 | Disposition: A | Discharge status: Home / Self Care | Payer: Medicare Other | Attending: Infectious Diseases | Admitting: Infectious Diseases

## 2019-11-18 DIAGNOSIS — I25118 Atherosclerotic heart disease of native coronary artery with other forms of angina pectoris: Secondary | ICD-10-CM

## 2019-11-18 DIAGNOSIS — I10 Essential (primary) hypertension: Secondary | ICD-10-CM

## 2019-11-18 LAB — ECHOCARDIOGRAM COMPLETE
AR max vel: 1.36 cm2
AV Area VTI: 1.56 cm2
AV Area mean vel: 1.42 cm2
AV Mean grad: 4 mmHg
AV Peak grad: 8.4 mmHg
Ao pk vel: 1.45 m/s
Area-P 1/2: 1.9 cm2
Height: 60 in
S' Lateral: 3.76 cm
Weight: 2567.99 oz

## 2019-11-18 LAB — CULTURE, BLOOD (ROUTINE X 2)

## 2019-11-18 LAB — GLUCOSE, CAPILLARY
Glucose-Capillary: 163 mg/dL — ABNORMAL HIGH (ref 70–99)
Glucose-Capillary: 234 mg/dL — ABNORMAL HIGH (ref 70–99)
Glucose-Capillary: 255 mg/dL — ABNORMAL HIGH (ref 70–99)
Glucose-Capillary: 244 mg/dL — ABNORMAL HIGH (ref 70–99)

## 2019-11-18 LAB — URINE CULTURE: Culture: >=100000 — AB

## 2019-11-18 LAB — CREATININE, SERUM
Creatinine, Ser: 1.08 mg/dL — ABNORMAL HIGH (ref 0.44–1.00)
GFR calc Af Amer: 59 mL/min — ABNORMAL LOW (ref >60)
GFR calc non Af Amer: 51 mL/min — ABNORMAL LOW (ref >60)

## 2019-11-18 MED ORDER — INSULIN GLARGINE 100 UNIT/ML ~~LOC~~ SOLN
75.0000 [IU] | Freq: Every day | SUBCUTANEOUS | Status: DC
  Administered 2019-11-18 – 2019-11-20 (×3): 75 [IU] via SUBCUTANEOUS
  Filled 2019-11-18 (×3): qty 0.75

## 2019-11-18 NOTE — Progress Notes (Signed)
*  PRELIMINARY RESULTS* Echocardiogram 2D Echocardiogram has been performed.  Lavell Luster Seleen Walter 11/18/2019, 12:40 PM

## 2019-11-18 NOTE — TOC Initial Note (Signed)
Transition of Care [facility]) - Initial/Assessment Note    Patient Details  Name: Andrea [name] MRN: [id number] Date of Birth: 07-18-1945  Transition of Care [facility]) CM/SW Contact:    Boris Sharper, LCSW Phone Number: 11/18/2019, 3:24 PM  Clinical Narrative:                 [facility] consult received for [facility] at discharge. CSW contacted pt to discuss discharge plans. CSW notified pt of PT recommendations and she was agreeable to [facility]. PT stated that she has had home health in the past but could not remember what agency it was with, pt did not have a preference in agencies.   CSW called Corene Cornea with [facility] with referral and he is unable to accept at this time and stated he would reevaluate closer to discharge.  TOC will continue to follow  Expected Discharge Plan: [place] Barriers to Discharge: Continued Medical Work up   Patient Goals and CMS Choice Patient states their goals for this hospitalization and ongoing recovery are:: to get home health and recover CMS Medicare.gov Compare Post Acute Care list provided to:: Patient Choice offered to / list presented to : Patient  Expected Discharge Plan and Services Expected Discharge Plan: [place]       Living arrangements for the past 2 months: Mobile Home                                      Prior Living Arrangements/Services Living arrangements for the past 2 months: Mobile Home Lives with:: Spouse Patient language and need for interpreter reviewed:: Yes Do you feel safe going back to the place where you live?: Yes      Need for Family Participation in Patient Care: Yes (Comment) Care giver support system in place?: Yes (comment) (children and spouse)   Criminal Activity/Legal Involvement Pertinent to Current Situation/Hospitalization: No - Comment as needed  Activities of Daily Living Home Assistive Devices/Equipment: Wheelchair ADL Screening (condition at time of admission) Patient's  cognitive ability adequate to safely complete daily activities?: Yes Is the patient deaf or have difficulty hearing?: No Does the patient have difficulty seeing, even when wearing glasses/contacts?: No Does the patient have difficulty concentrating, remembering, or making decisions?: Yes Patient able to express need for assistance with ADLs?: Yes Does the patient have difficulty dressing or bathing?: No Independently performs ADLs?: No Communication: Independent Does the patient have difficulty walking or climbing stairs?: Yes Weakness of Legs: Left Weakness of Arms/Hands: None  Permission Sought/Granted Permission sought to share information with : Facility Art therapist granted to share information with : Yes, Verbal Permission Granted  Share Information with NAME: Andrea [name]     Permission granted to share info w Relationship: daughter  Permission granted to share info w Contact Information: [phone]  Emotional Assessment Appearance:: Other (Comment Required (unable to assess) Attitude/Demeanor/Rapport: Unable to Assess Affect (typically observed): Unable to Assess Orientation: : Oriented to Self, Oriented to Place, Oriented to  Time, Oriented to Situation Alcohol / Substance Use: Not Applicable Psych Involvement: No (comment)  Admission diagnosis:  Sepsis ([place]) [A41.9] Sepsis with encephalopathy without septic shock, due to unspecified organism ([place]) [A41.9, R65.20, G93.40] Patient Active Problem List   Diagnosis Date Noted  . Sepsis ([place]) 11/15/2019  . PAD (peripheral artery disease) ([place]) 12/28/2018  . Moderate episode of recurrent major depressive disorder ([place]) 12/19/2017  .  Leg length discrepancy 09/21/2017  . Coronary artery disease of native artery of native heart with stable angina pectoris ([place]) 01/24/2017  . Drug-induced constipation 01/06/2017  . Spinal stenosis of lumbar region 01/06/2017  . Spondylolisthesis of lumbar region 12/30/2016  .  Obese 04/16/2015  . History of colon polyps 11/23/2012  . Bundle branch block, 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  . 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 Encounter for long-term (current) use of aspirin 11/23/2012  . Status post percutaneous transluminal coronary angioplasty 11/23/2012  . Transient ischemic attack (TIA), and cerebral infarction without residual deficits(V12.54) 11/23/2012  . Absolute anemia 08/17/2012  . Chronic obstructive pulmonary disease ([place]) 08/17/2012  . HLD (hyperlipidemia) 08/17/2012  . OP (osteoporosis) 08/17/2012  . 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 edema, with long-term current use of insulin ([place]) 08/17/2012  . Chronic systolic heart failure ([place]) 07/30/2012   PCP:  Mar Daring, PA-C Pharmacy:   [facility] [place] [facility] [address] [place] [address] Phone: [phone] Fax: [phone]  [id number] - [place], [address] [place] [address] Phone: [phone] Fax: [phone]     Social Determinants of Health (SDOH) Interventions    Readmission Risk Interventions No flowsheet data found.

## 2019-11-18 NOTE — Progress Notes (Signed)
Pt does not want her hydrocodone at this time this morning. States that she will let nurse know when she needs it

## 2019-11-18 NOTE — Plan of Care (Signed)

## 2019-11-18 NOTE — Progress Notes (Signed)
A consult was placed to the IV Therapist;  Current iv site is leaking;  Pt has been seen/assessed/attempted by 2 IV Team RNs since Friday;  Very poor venous access even with ultrasound;   Pt is currently in the chair;  Will need pt to be back in the bed to re-assess again today for another new iv site;  RN to update consult when pt is back in bed.   Raynelle Fanning RN  IV Therapy

## 2019-11-18 NOTE — Evaluation (Signed)
Occupational Therapy Evaluation Patient Details Name: Andrea [name] MRN: [id number] DOB: Aug 30, 1945 Today's Date: 11/18/2019    History of Present Illness Andrea [name] is a 74 y/o female who was admitted for AMS and headache. PMH includes CAD, DM II, GERD, HLD, recurrent UTI, PNA, depression, CVA, and TIA.   Clinical Impression   [name] was seen for OT evaluation this date. Prior to hospital admission, pt was MOD I for ADLs and mobility using w/c. Pt lives c husband who works during day, has family/friends available PRN. Pt presents to acute OT demonstrating impaired ADL performance, functional cognition, and functional mobility 2/2 decreased activity tolerance, decreased safety awareness, and functional strength/balance deficits. Pt currently requires SETUP for self-drinking at bed level. MIN A sup<>sit. SUPERVISION for grooming tasks seated EOB using intermittent single UE support. CGA + RW for standing grooming tasks c single UE support on RW. Pt would benefit from skilled OT to address noted impairments and functional limitations (see below for any additional details) in order to maximize safety and independence while minimizing falls risk and caregiver burden. Upon hospital discharge, recommend HHOT to maximize pt safety and return to functional independence during meaningful occupations of daily life.     Follow Up Recommendations  Home health OT;Other (comment) (Supervision for Mobility)    Equipment Recommendations  3 in 1 bedside commode    Recommendations for Other Services       Precautions / Restrictions Precautions Precautions: Fall Restrictions Weight Bearing Restrictions: No      Mobility Bed Mobility Overal bed mobility: Needs Assistance Bed Mobility: Supine to Sit;Sit to Supine     Supine to sit: Min assist;HOB elevated Sit to supine: Min assist      Transfers Overall transfer level: Needs assistance Equipment used: Rolling walker (2  wheeled) Transfers: Sit to/from Stand Sit to Stand: Min guard         General transfer comment: CGA + RW sit<>stand - VCs for hand placement in standing c single UE support for tasks. Pt utilizes rocking to achieve standing    Balance Overall balance assessment: Needs assistance Sitting-balance support: Feet supported;Single extremity supported Sitting balance-Leahy Scale: Good     Standing balance support: Single extremity supported;During functional activity Standing balance-Leahy Scale: Fair            ADL either performed or assessed with clinical judgement   ADL Overall ADL's : Needs assistance/impaired         General ADL Comments: SETUP self-drinking at bed level. SUPERVISION for grooming tasks seated EOB. CGA + RW for standing grooming tasks c single UE support on RW     Vision   Additional Comments: Questionsable visual deficits vs heacahe limiting participation            Pertinent Vitals/Pain Pain Assessment: Faces Faces Pain Scale: Hurts little more Pain Location: Headache Pain Descriptors / Indicators: Discomfort;Grimacing;Dull;Headache Pain Intervention(s): Monitored during session;Repositioned     Hand Dominance Right   Extremity/Trunk Assessment Upper Extremity Assessment Upper Extremity Assessment: Generalized weakness   Lower Extremity Assessment Lower Extremity Assessment: Generalized weakness       Communication Communication Communication: No difficulties   Cognition Arousal/Alertness: Awake/alert Behavior During Therapy: WFL for tasks assessed/performed Overall Cognitive Status: Within Functional Limits for tasks assessed      General Comments: VCs for multi-step commands   General Comments       Exercises Exercises: Other exercises Other Exercises Other Exercises: Pt and family educated re: OT role, DME recs, d/c  recs, importance of mobility for functional strengthening, home/routines modifications, safe t/f  techniques Other Exercises: Self-drinking, grooming, sup<>sit, sit<>stand, functional reach, sitting/standing balance/tolerance   Shoulder Instructions      Home Living Family/patient expects to be discharged to:: Private residence Living Arrangements: Spouse/significant other Available Help at Discharge: Family;Available PRN/intermittently;Friend(s) Type of Home: Mobile home Home Access: Ramped entrance;Other (comment) (has one step at top of ramp)     Home Layout: One level     Bathroom Shower/Tub: Tub/shower unit     Bathroom Accessibility: Yes How Accessible: Accessible via walker Home Equipment: [place] - 4 wheels;Walker - 2 wheels;Tub bench;Wheelchair - manual;Toilet riser   Additional Comments: Reports w/c cannot get close enough to toilet - uses UE support on sink for ~2 steps to t/f      Prior Functioning/Environment Level of Independence: Independent with assistive device(s)        Comments: Pt reports primary ambulation in manual wheelchair and is able to transfer to toilet utilizing sink. She reports no falls in last 6 months and states that her husband helps her to get baths.        OT Problem List: Decreased strength;Decreased activity tolerance;Impaired balance (sitting and/or standing);Decreased safety awareness      OT Treatment/Interventions: Self-care/ADL training;Therapeutic exercise;Energy conservation;DME and/or AE instruction;Therapeutic activities;Patient/family education;Balance training    OT Goals(Current goals can be found in the care plan section) Acute Rehab OT Goals Patient Stated Goal: To return home OT Goal Formulation: With patient/family Time For Goal Achievement: 12/02/19 Potential to Achieve Goals: Good ADL Goals Pt Will Perform Grooming: standing;with modified independence (c LRAD PRN) Pt Will Perform Lower Body Dressing: with supervision;sit to/from stand (c LRAD PRN) Pt Will Transfer to Toilet: with modified  independence;ambulating;bedside commode (c LRAD PRN) Additional ADL Goal #1: Pt will Independently verbalize plan to implement x3 ECS  OT Frequency: Min 2X/week   Barriers to D/C: Inaccessible home environment;Decreased caregiver support             AM-PAC OT "6 Clicks" Daily Activity     Outcome Measure Help from another person eating meals?: None Help from another person taking care of personal grooming?: A Little Help from another person toileting, which includes using toliet, bedpan, or urinal?: A Little Help from another person bathing (including washing, rinsing, drying)?: A Little Help from another person to put on and taking off regular upper body clothing?: None Help from another person to put on and taking off regular lower body clothing?: A Little 6 Click Score: 20   End of Session Equipment Utilized During Treatment: Rolling walker Nurse Communication: Other (comment) (Need for BSC in room )  Activity Tolerance: Patient tolerated treatment well Patient left: in bed;with call bell/phone within reach;with nursing/sitter in room;with family/visitor present  OT Visit Diagnosis: Other abnormalities of gait and mobility (R26.89)                Time: 4801-6553 OT Time Calculation (min): 32 min Charges:  OT General Charges $OT Visit: 1 Visit OT Evaluation $OT Eval Moderate Complexity: 1 Mod OT Treatments $Self Care/Home Management : 23-37 mins  Dessie Coma, M.S. OTR/L  11/18/19, 11:27 AM  ascom [phone]

## 2019-11-18 NOTE — Telephone Encounter (Signed)
Requested Prescriptions  Pending Prescriptions Disp Refills  . losartan (COZAAR) 25 MG tablet [Pharmacy Med Name: LOSARTAN POTASSIUM 25 MG TAB] 90 tablet 1    Sig: TAKE 1 TABLET BY MOUTH EVERY DAY     Cardiovascular:  Angiotensin Receptor Blockers Failed - 11/18/2019 12:52 AM      Failed - Last BP in normal range    BP Readings from Last 1 Encounters:  11/17/19 (!) 110/47         Passed - Cr in normal range and within 180 days    Creat  Date Value Ref Range Status  12/16/2016 1.08 (H) 0.60 - 0.93 mg/dL Final    Comment:    For patients >82 years of age, the reference limit for Creatinine is approximately 13% higher for people identified as African-American. .    Creatinine, Ser  Date Value Ref Range Status  11/16/2019 0.96 0.44 - 1.00 mg/dL Final         Passed - K in normal range and within 180 days    Potassium  Date Value Ref Range Status  11/16/2019 4.0 3.5 - 5.1 mmol/L Final  01/01/2014 4.2 3.5 - 5.1 mmol/L Final         Passed - Patient is not pregnant      Passed - Valid encounter within last 6 months    Recent Outpatient Visits          1 month ago 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 edema, with long-term current use of insulin [facility])   [place], [place], PA-C   4 months ago Type 2 diabetes mellitus with diabetic peripheral angiopathy without gangrene, with long-term current use of insulin [facility])   [facility] [place], [place], PA-C   7 months ago Type 2 diabetes mellitus with diabetic peripheral angiopathy without gangrene, with long-term current use of insulin [facility])   [facility] [place], [place] M, [place]   10 months ago Annual physical exam   [facility], [place], PA-C   1 year ago 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, with long-term current use of insulin, macular edema presence  unspecified, unspecified laterality [facility])   [place], [place], [place]

## 2019-11-18 NOTE — Progress Notes (Addendum)
PROGRESS NOTE    Andrea [name]  GLO:[id number]  DOB: 12-16-1945  DOA: 11/15/2019 PCP: [facility]:   Hospital course:  Patient is a 74 year old female with CAD, DM 2 and recurrent UTI and pneumonia who was admitted 11/15/2019 with fever, leukocytosis and altered mental status.  Patient was started on broad-spectrum antibiotics to cover treatment of meningitis although patient refused lumbar puncture.  She was treated with cefepime, vancomycin, ampicillin, metronidazole and acyclovir.  Blood cultures became positive for strep pneumonia with no clear source as chest x-ray was negative.  Antibiotics were deescalated to vancomycin and ceftriaxone.  Patient improved clinically with resumption of normal mental status.  She was seen by ID who recommended continue ceftriaxone for 14 days until MIC of pneumococcus has returned.  Vancomycin has been discontinued.   Subjective:  Patient states that she feels quite well.  She does not remember all of what happened however at present states that she generally feels good.  Her main concern is ongoing headache which she describes as a dull throb over her head and and occasionally runny nose productive of thin clear phlegm.  Patient apparently been having headache and a runny nose since prior to admission and was taking Claritin with good effect.  Patient's daughter is at bedside and notes that she seems to be back to baseline.   Objective: Vitals:   11/17/19 1558 11/17/19 2356 11/18/19 0806 11/18/19 0854  BP: (!) 113/46 (!) 110/47 (!) 121/44   Pulse: 89 (!) 101 97   Resp: 16 17 18    Temp: 98.5 F (36.9 C) 98.2 F (36.8 C) 98.3 F (36.8 C)   TempSrc: Oral Oral Oral   SpO2: 100% 93% (!) 89% 91%  Weight:      Height:        Intake/Output Summary (Last 24 hours) at 11/18/2019 1555 Last data filed at 11/18/2019 1531 Gross per 24 hour  Intake 100 ml  Output 1650 ml  Net -1550 ml   Filed Weights    11/15/19 1050  Weight: 72.8 kg     Exam:  General: Relatively well-appearing but somewhat pale female sitting up in bed chatting comfortably with her attentive daughter who is at bedside Eyes: sclera anicteric, conjuctiva mild injection bilaterally CVS: S1-S2, regular  Respiratory:  decreased air entry bilaterally secondary to decreased inspiratory effort, rales at bases  GI: NABS, soft, NT  LE: No edema.  Neuro: A/O x 3, Moving all extremities equally with normal strength, CN 3-12 intact, grossly nonfocal.  Psych: patient is logical and coherent, judgement and insight appear normal, mood and affect appropriate to situation.   Assessment & Plan:   Sepsis secondary to pneumococcal bacteremia complicated by acute metabolic encephalopathy Patient is much improved with aggressive treatment, now consolidated to ceftriaxone for treatment of pneumococcal bacteremia. Of note patient notes persistent headache which she apparently had been having since prior to admission.  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 Continue ceftriaxone pending MIC of pneumococcus She will need 14 days of IV antibiotics per ID PICC line will need to be placed per ID recommendations  Headache As noted above, this may be secondary to resolving meningitis However patient states she thought Claritin was effective at home, will start Claritin here Continue as needed Norco which has been effective for headache per patient, she had initially been getting it for her hip but she notes that it makes her headache much  better.  Acute metabolic encephalopathy Resolved, patient is back to baseline  DM2 Blood sugars remain persistently elevated in the mid 200s, will increase glargine from 62 to 75 units Continue high-dose SSI AC at bedtime  Chronic LBP/hip pain with spinal stenosis Continue Norco  COPD No evidence for acute flare Continue O2 at home doses As needed inhaled  bronchodilators  CAD No chest pain, continue metoprolol, losartan, aspirin and statin   DVT prophylaxis: Lovenox Code Status: Full Family Communication: Patient's daughter Andrea [name] was at bedside throughout Disposition Plan:   Patient is from: Home  Anticipated Discharge Location: TBD  Barriers to Discharge: Still getting IV antibiotics, recently bacteremic  Is patient medically stable for Discharge: Not yet   Consultants:  ID  Procedures:  None  Antimicrobials:  Presently only on ceftriaxone   Data Reviewed:  Basic Metabolic Panel: Recent Labs  Lab 11/15/19 1103 11/16/19 0358 11/18/19 0809  NA 133* 137  --   K 4.2 4.0  --   CL 98 102  --   CO2 22 21*  --   GLUCOSE 269* 351*  --   BUN 21 21  --   CREATININE 0.98 0.96 1.08*  CALCIUM 9.6 8.6*  --    Liver Function Tests: Recent Labs  Lab 11/15/19 1103 11/16/19 0358  AST 15 13*  ALT 17 12  ALKPHOS 102 80  BILITOT 2.1* 1.9*  PROT 7.4 6.2*  ALBUMIN 4.1 3.2*   No results for input(s): LIPASE, AMYLASE in the last 168 hours. No results for input(s): AMMONIA in the last 168 hours. CBC: Recent Labs  Lab 11/15/19 1103 11/16/19 0623  WBC 21.5* 17.4*  NEUTROABS  --  15.9*  HGB 13.5 11.9*  HCT 39.7 33.8*  MCV 91.3 86.9  PLT 161 143*   Cardiac Enzymes: No results for input(s): CKTOTAL, CKMB, CKMBINDEX, TROPONINI in the last 168 hours. BNP (last 3 results) No results for input(s): PROBNP in the last 8760 hours. CBG: Recent Labs  Lab 11/17/19 1233 11/17/19 1700 11/17/19 2057 11/18/19 0803 11/18/19 1142  GLUCAP 256* 324* 249* 163* 234*    Recent Results (from the past 240 hour(s))  Urine culture     Status: Abnormal   Collection Time: 11/15/19 11:03 AM   Specimen: In/Out Cath Urine  Result Value Ref Range Status   Specimen Description   Final    IN/OUT CATH URINE Performed at [facility], [address], [place], [place] [address]    Special Requests   Final    NONE Performed at  [facility], [address] [place], [place] [address]    Culture >=100,000 COLONIES/mL ESCHERICHIA COLI (A)  Final   Report Status 11/18/2019 FINAL  Final   Organism ID, Bacteria ESCHERICHIA COLI (A)  Final      Susceptibility   Escherichia coli - MIC*    AMPICILLIN >=32 RESISTANT Resistant     CEFAZOLIN <=4 SENSITIVE Sensitive     CEFTRIAXONE <=0.25 SENSITIVE Sensitive     CIPROFLOXACIN <=0.25 SENSITIVE Sensitive     GENTAMICIN <=1 SENSITIVE Sensitive     IMIPENEM <=0.25 SENSITIVE Sensitive     NITROFURANTOIN <=16 SENSITIVE Sensitive     TRIMETH/SULFA <=20 SENSITIVE Sensitive     AMPICILLIN/SULBACTAM 8 SENSITIVE Sensitive     PIP/TAZO <=4 SENSITIVE Sensitive     * >=100,000 COLONIES/mL ESCHERICHIA COLI  Blood culture (routine x 2)     Status: Abnormal   Collection Time: 11/15/19  6:38 PM   Specimen: BLOOD  Result Value Ref Range Status   Specimen Description   Final    BLOOD LEFT ANTECUBITAL Performed at [facility], [address], [place], [place] [address]    Special Requests   Final    BOTTLES DRAWN AEROBIC AND ANAEROBIC Blood Culture results may not be optimal due to an excessive volume of blood received in culture bottles Performed at [facility], [address] [place], [place] [address]    Culture  Setup Time   Final    Organism ID to follow [place] BOTTLES CRITICAL RESULT CALLED TO, READ BACK BY AND VERIFIED WITH: SUSAN WATSON AT 0820 ON 11/16/19 SNG Performed at [facility], [address] [place], [place] [address]    Culture STREPTOCOCCUS PNEUMONIAE (A)  Final   Report Status 11/18/2019 FINAL  Final   Organism ID, Bacteria STREPTOCOCCUS PNEUMONIAE  Final      Susceptibility   Streptococcus pneumoniae - MIC*    ERYTHROMYCIN <=0.12 SENSITIVE Sensitive     LEVOFLOXACIN 0.5 SENSITIVE Sensitive     VANCOMYCIN 0.5 SENSITIVE Sensitive     PENICILLIN (meningitis) <=0.06 SENSITIVE  Sensitive     PENO - penicillin <=0.06      PENICILLIN (non-meningitis) <=0.06 SENSITIVE Sensitive     PENICILLIN (oral) <=0.06 SENSITIVE Sensitive     CEFTRIAXONE (non-meningitis) <=0.12 SENSITIVE Sensitive     CEFTRIAXONE (meningitis) <=0.12 SENSITIVE Sensitive     * STREPTOCOCCUS PNEUMONIAE  SARS Coronavirus 2 by RT PCR (hospital order, performed in [place] hospital lab) Nasopharyngeal Nasopharyngeal Swab     Status: None   Collection Time: 11/15/19  6:38 PM   Specimen: Nasopharyngeal Swab  Result Value Ref Range Status   SARS Coronavirus 2 NEGATIVE NEGATIVE Final    Comment: (NOTE) SARS-CoV-2 target nucleic acids are NOT DETECTED.  The SARS-CoV-2 RNA is generally detectable in upper and lower respiratory specimens during the acute phase of infection. The lowest concentration of SARS-CoV-2 viral copies this assay can detect is 250 copies / mL. A negative result does not preclude SARS-CoV-2 infection and should not be used as the sole basis for treatment or other patient management decisions.  A negative result may occur with improper specimen collection / handling, submission of specimen other than nasopharyngeal swab, presence of viral mutation(s) within the areas targeted by this assay, and inadequate number of viral copies (<250 copies / mL). A negative result must be combined with clinical observations, patient history, and epidemiological information.  Fact Sheet for Patients:   StrictlyIdeas.no  Fact Sheet for Healthcare Providers: BankingDealers.co.za  This test is not yet approved or  cleared by the [place] FDA and has been authorized for detection and/or diagnosis of SARS-CoV-2 by FDA under an Emergency Use Authorization (EUA).  This EUA will remain in effect (meaning this test can be used) for the duration of the COVID-19 declaration under Section 564(b)(1) of the Act, 21 U.S.C. section 360bbb-3(b)(1), unless  the authorization is terminated or revoked sooner.  Performed at [facility], [address] [place], [place] [address]   Blood Culture ID Panel (Reflexed)     Status: Abnormal   Collection Time: 11/15/19  6:38 PM  Result Value Ref Range Status   Enterococcus faecalis NOT DETECTED NOT DETECTED Final   Enterococcus Faecium NOT DETECTED NOT DETECTED Final   Listeria monocytogenes NOT DETECTED NOT DETECTED Final   Staphylococcus species NOT DETECTED NOT DETECTED Final   Staphylococcus aureus (BCID) NOT  DETECTED NOT DETECTED Final   Staphylococcus epidermidis NOT DETECTED NOT DETECTED Final   Staphylococcus lugdunensis NOT DETECTED NOT DETECTED Final   Streptococcus species DETECTED (A) NOT DETECTED Final    Comment: CRITICAL RESULT CALLED TO, READ BACK BY AND VERIFIED WITH: SUSAN WATSON AT 0820 ON 11/16/19 SNG    Streptococcus agalactiae NOT DETECTED NOT DETECTED Final   Streptococcus pneumoniae DETECTED (A) NOT DETECTED Final    Comment: CRITICAL RESULT CALLED TO, READ BACK BY AND VERIFIED WITH: SUSAN WATSON AT 0820 ON 11/16/19 SNG    Streptococcus pyogenes NOT DETECTED NOT DETECTED Final   A.calcoaceticus-baumannii NOT DETECTED NOT DETECTED Final   Bacteroides fragilis NOT DETECTED NOT DETECTED Final   Enterobacterales NOT DETECTED NOT DETECTED Final   Enterobacter cloacae complex NOT DETECTED NOT DETECTED Final   Escherichia coli NOT DETECTED NOT DETECTED Final   Klebsiella aerogenes NOT DETECTED NOT DETECTED Final   Klebsiella oxytoca NOT DETECTED NOT DETECTED Final   Klebsiella pneumoniae NOT DETECTED NOT DETECTED Final   Proteus species NOT DETECTED NOT DETECTED Final   Salmonella species NOT DETECTED NOT DETECTED Final   Serratia marcescens NOT DETECTED NOT DETECTED Final   Haemophilus influenzae NOT DETECTED NOT DETECTED Final   Neisseria meningitidis NOT DETECTED NOT DETECTED Final   Pseudomonas aeruginosa NOT DETECTED NOT DETECTED Final   Stenotrophomonas  maltophilia NOT DETECTED NOT DETECTED Final   Candida albicans NOT DETECTED NOT DETECTED Final   Candida auris NOT DETECTED NOT DETECTED Final   Candida glabrata NOT DETECTED NOT DETECTED Final   Candida krusei NOT DETECTED NOT DETECTED Final   Candida parapsilosis NOT DETECTED NOT DETECTED Final   Candida tropicalis NOT DETECTED NOT DETECTED Final   Cryptococcus neoformans/gattii NOT DETECTED NOT DETECTED Final    Comment: Performed at [facility], [address] [place], [place] [address]  Culture, blood (Routine X 2) w Reflex to ID Panel     Status: None (Preliminary result)   Collection Time: 11/16/19 11:53 PM   Specimen: BLOOD  Result Value Ref Range Status   Specimen Description BLOOD BLOOD LEFT FOREARM  Final   Special Requests   Final    BOTTLES DRAWN AEROBIC AND ANAEROBIC Blood Culture adequate volume   Culture   Final    NO GROWTH 1 DAY Performed at [facility], [address] [place], [place] [address]    Report Status PENDING  Incomplete  Culture, blood (Routine X 2) w Reflex to ID Panel     Status: None (Preliminary result)   Collection Time: 11/16/19 11:53 PM   Specimen: BLOOD  Result Value Ref Range Status   Specimen Description BLOOD BLOOD LEFT HAND  Final   Special Requests   Final    BOTTLES DRAWN AEROBIC AND ANAEROBIC Blood Culture adequate volume   Culture   Final    NO GROWTH 1 DAY Performed at [facility], [address] [place], [place] [address]    Report Status PENDING  Incomplete      Studies: ECHOCARDIOGRAM COMPLETE  Result Date: 11/18/2019    ECHOCARDIOGRAM REPORT   Patient Name:   Andrea [name] Date of Exam: 11/18/2019 Medical Rec #:  [id number]        Height:       60.0 in Accession #:    [id number]       Weight:       160.5 lb Date of Birth:  08/16/1945        BSA:  1.700 m Patient Age:    40 years         BP:           110/47 mmHg Patient Gender: F                HR:           92 bpm. Exam Location:   [facility] Procedure: 2D Echo Indications:     Bacteremia R78.81  History:         Patient has prior history of Echocardiogram examinations, most                  recent 05/27/2016.  Sonographer:     Arville Go RDCS Referring Phys:  [id number] Tsosie Billing Diagnosing Phys: Ida Rogue MD  Sonographer Comments: Technically difficult study due to poor echo windows, suboptimal apical window and suboptimal subcostal window. Image acquisition challenging due to patient body habitus. IMPRESSIONS  1. Challenging images  2. Left ventricular ejection fraction, by grossly estimated at 40 to 45% though difficult to visualize. The left ventricle has mildly decreased function. The left ventricle demonstrates regional wall motion abnormalities (images concerning for anterior wall hypokinesis). Left ventricular diastolic parameters are consistent with Grade I diastolic dysfunction (impaired relaxation).  3. Right ventricular systolic function is normal. The right ventricular size is normal.  4. Valves not well visualized. FINDINGS  Left Ventricle: Left ventricular ejection fraction, by estimation, is 40 to 45%. The left ventricle has mildly decreased function. The left ventricle demonstrates regional wall motion abnormalities. The left ventricular internal cavity size was normal in size. There is no left ventricular hypertrophy. Left ventricular diastolic parameters are consistent with Grade I diastolic dysfunction (impaired relaxation). Right Ventricle: The right ventricular size is normal. No increase in right ventricular wall thickness. Right ventricular systolic function is normal. Left Atrium: Left atrial size was normal in size. Right Atrium: Right atrial size was normal in size. Pericardium: There is no evidence of pericardial effusion. Mitral Valve: The mitral valve is normal in structure. No evidence of mitral valve regurgitation. No evidence of mitral valve stenosis. Tricuspid Valve: The tricuspid valve is normal  in structure. Tricuspid valve regurgitation is not demonstrated. No evidence of tricuspid stenosis. Aortic Valve: The aortic valve was not well visualized. Aortic valve regurgitation is not visualized. No aortic stenosis is present. Aortic valve mean gradient measures 4.0 mmHg. Aortic valve peak gradient measures 8.4 mmHg. Aortic valve area, by VTI measures 1.56 cm. Pulmonic Valve: The pulmonic valve was normal in structure. Pulmonic valve regurgitation is not visualized. No evidence of pulmonic stenosis. Aorta: The aortic root is normal in size and structure. Venous: The inferior vena cava is normal in size with greater than 50% respiratory variability, suggesting right atrial pressure of 3 mmHg. IAS/Shunts: No atrial level shunt detected by color flow Doppler.  LEFT VENTRICLE PLAX 2D LVIDd:         5.36 cm  Diastology LVIDs:         3.76 cm  LV e' medial:    5.98 cm/s LV PW:         0.90 cm  LV E/e' medial:  14.9 LV IVS:        0.82 cm  LV e' lateral:   5.66 cm/s LVOT diam:     1.90 cm  LV E/e' lateral: 15.8 LV SV:         43 LV SV Index:   26 LVOT Area:     2.84 cm  LEFT ATRIUM           Index LA diam:      3.40 cm 2.00 cm/m LA Vol (A4C): 42.2 ml 24.82 ml/m  AORTIC VALVE                   PULMONIC VALVE AV Area (Vmax):    1.36 cm    PV Vmax:       0.99 m/s AV Area (Vmean):   1.42 cm    PV Peak grad:  3.9 mmHg AV Area (VTI):     1.56 cm AV Vmax:           145.00 cm/s AV Vmean:          94.600 cm/s AV VTI:            0.278 m AV Peak Grad:      8.4 mmHg AV Mean Grad:      4.0 mmHg LVOT Vmax:         69.50 cm/s LVOT Vmean:        47.300 cm/s LVOT VTI:          0.153 m LVOT/AV VTI ratio: 0.55  AORTA Ao Root diam: 2.70 cm MITRAL VALVE MV Area (PHT): 1.90 cm     SHUNTS MV Decel Time: 400 msec     Systemic VTI:  0.15 m MV E velocity: 89.25 cm/s   Systemic Diam: 1.90 cm MV A velocity: 127.00 cm/s MV E/A ratio:  0.70 Ida Rogue MD Electronically signed by Ida Rogue MD Signature Date/Time: 11/18/2019/1:34:17  PM    Final      Scheduled Meds: . aspirin EC  81 mg Oral Daily  . atorvastatin  80 mg Oral QHS  . enoxaparin (LOVENOX) injection  40 mg Subcutaneous Q24H  . feeding supplement (GLUCERNA SHAKE)  237 mL Oral Q24H  . insulin aspart  0-20 Units Subcutaneous TID AC & HS  . insulin glargine  65 Units Subcutaneous Q supper  . lidocaine-prilocaine   Topical Once  . loratadine  10 mg Oral Daily  . losartan  25 mg Oral BH-q7a  . metoprolol succinate  25 mg Oral BH-q7a  . oxybutynin  10 mg Oral BH-q7a  . pantoprazole  40 mg Oral Daily  . vitamin B-12  1,000 mcg Oral Daily   Continuous Infusions: . cefTRIAXone (ROCEPHIN)  IV 2 g (11/18/19 0853)  . lactated ringers 50 mL/hr at 11/18/19 1135    Principal Problem:   Sepsis ([place]) Active Problems:   Chronic systolic heart failure (HCC)   Chronic obstructive pulmonary disease (HCC)   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 edema, with long-term current use of insulin ([place])   Coronary artery disease of native artery of native heart with stable angina pectoris ([place])   Spinal stenosis of lumbar region     Maudean Hoffmann Derek Jack, [facility]  If 7PM-7AM, please contact night-coverage [email or web address] Password [email or web address] 11/18/2019, 3:55 PM    LOS: 3 days

## 2019-11-19 LAB — CBC WITH DIFFERENTIAL/PLATELET
Abs Immature Granulocytes: 0.05 10*3/uL (ref 0.00–0.07)
Basophils Absolute: 0 10*3/uL (ref 0.0–0.1)
Basophils Relative: 0 %
Eosinophils Absolute: 0.6 10*3/uL — ABNORMAL HIGH (ref 0.0–0.5)
Eosinophils Relative: 5 %
HCT: 29.5 % — ABNORMAL LOW (ref 36.0–46.0)
Hemoglobin: 10.2 g/dL — ABNORMAL LOW (ref 12.0–15.0)
Immature Granulocytes: 0 %
Lymphocytes Relative: 18 %
Lymphs Abs: 2.2 10*3/uL (ref 0.7–4.0)
MCH: 30.8 pg (ref 26.0–34.0)
MCHC: 34.6 g/dL (ref 30.0–36.0)
MCV: 89.1 fL (ref 80.0–100.0)
Monocytes Absolute: 0.8 10*3/uL (ref 0.1–1.0)
Monocytes Relative: 6 %
Neutro Abs: 8.7 10*3/uL — ABNORMAL HIGH (ref 1.7–7.7)
Neutrophils Relative %: 71 %
Platelets: 147 10*3/uL — ABNORMAL LOW (ref 150–400)
RBC: 3.31 MIL/uL — ABNORMAL LOW (ref 3.87–5.11)
RDW: 13.3 % (ref 11.5–15.5)
WBC: 12.3 10*3/uL — ABNORMAL HIGH (ref 4.0–10.5)
nRBC: 0 % (ref 0.0–0.2)

## 2019-11-19 LAB — GLUCOSE, CAPILLARY
Glucose-Capillary: 128 mg/dL — ABNORMAL HIGH (ref 70–99)
Glucose-Capillary: 211 mg/dL — ABNORMAL HIGH (ref 70–99)
Glucose-Capillary: 287 mg/dL — ABNORMAL HIGH (ref 70–99)
Glucose-Capillary: 334 mg/dL — ABNORMAL HIGH (ref 70–99)

## 2019-11-19 LAB — BASIC METABOLIC PANEL
Anion gap: 9 (ref 5–15)
BUN: 24 mg/dL — ABNORMAL HIGH (ref 8–23)
CO2: 23 mmol/L (ref 22–32)
Calcium: 8.6 mg/dL — ABNORMAL LOW (ref 8.9–10.3)
Chloride: 105 mmol/L (ref 98–111)
Creatinine, Ser: 1.06 mg/dL — ABNORMAL HIGH (ref 0.44–1.00)
GFR calc Af Amer: 60 mL/min — ABNORMAL LOW (ref >60)
GFR calc non Af Amer: 52 mL/min — ABNORMAL LOW (ref >60)
Glucose, Bld: 180 mg/dL — ABNORMAL HIGH (ref 70–99)
Potassium: 3.8 mmol/L (ref 3.5–5.1)
Sodium: 137 mmol/L (ref 135–145)

## 2019-11-19 MED ORDER — IPRATROPIUM-ALBUTEROL 0.5-2.5 (3) MG/3ML IN SOLN
3.0000 mL | Freq: Four times a day (QID) | RESPIRATORY_TRACT | Status: DC | PRN
  Administered 2019-11-20: 3 mL via RESPIRATORY_TRACT

## 2019-11-19 MED ORDER — PENICILLIN G POTASSIUM 20000000 UNITS IJ SOLR
12.0000 10*6.[IU] | Freq: Two times a day (BID) | INTRAVENOUS | Status: AC
  Administered 2019-11-19 – 2019-11-21 (×3): 12 10*6.[IU] via INTRAVENOUS
  Filled 2019-11-19 (×6): qty 12

## 2019-11-19 MED ORDER — SERTRALINE HCL 50 MG PO TABS
25.0000 mg | ORAL_TABLET | Freq: Every day | ORAL | Status: DC
  Administered 2019-11-19 – 2019-11-24 (×6): 25 mg via ORAL
  Filled 2019-11-19 (×6): qty 1

## 2019-11-19 MED ORDER — ASPIRIN-ACETAMINOPHEN-CAFFEINE 250-250-65 MG PO TABS
1.0000 | ORAL_TABLET | Freq: Three times a day (TID) | ORAL | Status: DC | PRN
  Administered 2019-11-19 – 2019-11-24 (×4): 1 via ORAL
  Filled 2019-11-19 (×6): qty 1

## 2019-11-19 NOTE — Progress Notes (Signed)
PROGRESS NOTE    Andrea [name]  ASN:[id number]  DOB: 05/26/1945  DOA: 11/15/2019 PCP: [facility]:   Hospital course:  Patient is a 74 year old female with CAD, DM 2 and recurrent UTI and pneumonia who was admitted 11/15/2019 with fever, leukocytosis and altered mental status.  Patient was started on broad-spectrum antibiotics to cover treatment of meningitis although patient refused lumbar puncture.  She was treated with cefepime, vancomycin, ampicillin, metronidazole and acyclovir.  Blood cultures became positive for strep pneumonia with no clear source as chest x-ray was negative.  Antibiotics were deescalated to vancomycin and ceftriaxone.  Patient improved clinically with resumption of normal mental status.  She was seen by ID who recommended continue ceftriaxone for 14 days until MIC of pneumococcus has returned.  Vancomycin has been discontinued.   Subjective:  Patient states that she feels quite well.  She does not remember all of what happened however at present states that she generally feels good.  Her main concern is ongoing headache which she describes as a dull throb over her head and and occasionally runny nose productive of thin clear phlegm.  Patient apparently been having headache and a runny nose since prior to admission and was taking Claritin with good effect.  Patient's daughter is at bedside and notes that she seems to be back to baseline.   Objective: Vitals:   11/18/19 1616 11/18/19 2340 11/19/19 0740 11/19/19 1605  BP: 136/71 (!) 144/57 126/76 (!) 159/68  Pulse: (!) 105 100 (!) 106 95  Resp: 18 20 18 18   Temp: 98.9 F (37.2 C) 98.1 F (36.7 C) 98.6 F (37 C) 98.4 F (36.9 C)  TempSrc: Oral Oral    SpO2: 94% 95% 95% 97%  Weight:      Height:       No intake or output data in the 24 hours ending 11/19/19 1618 Filed Weights   11/15/19 1050  Weight: 72.8 kg     Exam:  General: Relatively well-appearing but  somewhat pale female sitting up in bed chatting comfortably with her attentive daughter who is at bedside Eyes: sclera anicteric, conjuctiva mild injection bilaterally CVS: S1-S2, regular  Respiratory:  decreased air entry bilaterally secondary to decreased inspiratory effort, rales at bases  GI: NABS, soft, NT  LE: No edema.  Neuro: A/O x 3, Moving all extremities equally with normal strength, CN 3-12 intact, grossly nonfocal.  Psych: patient is logical and coherent, judgement and insight appear normal, mood and affect appropriate to situation.   Assessment & Plan:   Sepsis secondary to pneumococcal bacteremia complicated by acute metabolic encephalopathy Patient is much improved with aggressive treatment, now consolidated to ceftriaxone for treatment of pneumococcal bacteremia. Of note patient notes persistent headache which she apparently had been having since prior to admission.  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  Continue ceftriaxone pending MIC of pneumococcus Discussed with ID who are recommending TEE Cardiology has been consulted (Arida/Ryan Dunn) She will need 14 days of IV antibiotics per ID for treatment of presumed meningitis  PICC line can be placed once endocarditis has ruled out  Headache This may be secondary to resolving meningitis However patient states she thought Claritin was effective at home, will start Claritin here Continue as needed Norco which has been effective for headache per patient, she had initially been getting it for her hip but she  notes that it makes her headache much better.  Acute metabolic encephalopathy Resolved, patient is back to baseline  DM2 Blood sugars remain persistently elevated in the mid 200s, will increase glargine from 62 to 75 units Continue high-dose SSI AC at bedtime  Chronic LBP/hip pain with spinal stenosis Continue  Norco  COPD No evidence for acute flare Continue O2 at home doses As needed inhaled bronchodilators  CAD No chest pain, continue metoprolol, losartan, aspirin and statin   DVT prophylaxis: Lovenox Code Status: Full Family Communication: Patient's [name] was at bedside throughout Disposition Plan:   Patient is from: Home  Anticipated Discharge Location: TBD  Barriers to Discharge: Still getting IV antibiotics, recently bacteremic  Is patient medically stable for Discharge: Not yet   Consultants:  ID  Procedures:  None  Antimicrobials:  Presently only on ceftriaxone   Data Reviewed:  Basic Metabolic Panel: Recent Labs  Lab 11/15/19 1103 11/16/19 0358 11/18/19 0809 11/19/19 0346  NA 133* 137  --  137  K 4.2 4.0  --  3.8  CL 98 102  --  105  CO2 22 21*  --  23  GLUCOSE 269* 351*  --  180*  BUN 21 21  --  24*  CREATININE 0.98 0.96 1.08* 1.06*  CALCIUM 9.6 8.6*  --  8.6*   Liver Function Tests: Recent Labs  Lab 11/15/19 1103 11/16/19 0358  AST 15 13*  ALT 17 12  ALKPHOS 102 80  BILITOT 2.1* 1.9*  PROT 7.4 6.2*  ALBUMIN 4.1 3.2*   No results for input(s): LIPASE, AMYLASE in the last 168 hours. No results for input(s): AMMONIA in the last 168 hours. CBC: Recent Labs  Lab 11/15/19 1103 11/16/19 0623 11/19/19 0346  WBC 21.5* 17.4* 12.3*  NEUTROABS  --  15.9* 8.7*  HGB 13.5 11.9* 10.2*  HCT 39.7 33.8* 29.5*  MCV 91.3 86.9 89.1  PLT 161 143* 147*   Cardiac Enzymes: No results for input(s): CKTOTAL, CKMB, CKMBINDEX, TROPONINI in the last 168 hours. BNP (last 3 results) No results for input(s): PROBNP in the last 8760 hours. CBG: Recent Labs  Lab 11/18/19 1142 11/18/19 1631 11/18/19 2128 11/19/19 0737 11/19/19 1159  GLUCAP 234* 255* 244* 128* 211*    Recent Results (from the past 240 hour(s))  Urine culture     Status: Abnormal   Collection Time: 11/15/19 11:03 AM   Specimen: In/Out Cath Urine  Result Value Ref Range Status     Specimen Description   Final    IN/OUT CATH URINE Performed at [facility], [address] [place], [place] [address]    Special Requests   Final    NONE Performed at [facility], [address] [place], [place] [address]    Culture >=100,000 COLONIES/mL ESCHERICHIA COLI (A)  Final   Report Status 11/18/2019 FINAL  Final   Organism ID, Bacteria ESCHERICHIA COLI (A)  Final      Susceptibility   Escherichia coli - MIC*    AMPICILLIN >=32 RESISTANT Resistant     CEFAZOLIN <=4 SENSITIVE Sensitive     CEFTRIAXONE <=0.25 SENSITIVE Sensitive     CIPROFLOXACIN <=0.25 SENSITIVE Sensitive     GENTAMICIN <=1 SENSITIVE Sensitive     IMIPENEM <=0.25 SENSITIVE Sensitive     NITROFURANTOIN <=16 SENSITIVE Sensitive     TRIMETH/SULFA <=20 SENSITIVE Sensitive     AMPICILLIN/SULBACTAM 8 SENSITIVE Sensitive     PIP/TAZO <=4 SENSITIVE Sensitive     * >=100,000 COLONIES/mL ESCHERICHIA  COLI  Blood culture (routine x 2)     Status: Abnormal   Collection Time: 11/15/19  6:38 PM   Specimen: BLOOD  Result Value Ref Range Status   Specimen Description   Final    BLOOD LEFT ANTECUBITAL Performed at [facility], [place] [address] [place], [place] [address]    Special Requests   Final    BOTTLES DRAWN AEROBIC AND ANAEROBIC Blood Culture results may not be optimal due to an excessive volume of blood received in culture bottles Performed at [facility], [address] [place], [place] [address]    Culture  Setup Time   Final    Organism ID to follow [place] BOTTLES CRITICAL RESULT CALLED TO, READ BACK BY AND VERIFIED WITH: SUSAN WATSON AT 0820 ON 11/16/19 SNG Performed at [facility], [address] [place], [place] [address]    Culture STREPTOCOCCUS PNEUMONIAE (A)  Final   Report Status 11/18/2019 FINAL  Final   Organism ID, Bacteria STREPTOCOCCUS PNEUMONIAE  Final      Susceptibility   Streptococcus  pneumoniae - MIC*    ERYTHROMYCIN <=0.12 SENSITIVE Sensitive     LEVOFLOXACIN 0.5 SENSITIVE Sensitive     VANCOMYCIN 0.5 SENSITIVE Sensitive     PENICILLIN (meningitis) <=0.06 SENSITIVE Sensitive     PENO - penicillin <=0.06      PENICILLIN (non-meningitis) <=0.06 SENSITIVE Sensitive     PENICILLIN (oral) <=0.06 SENSITIVE Sensitive     CEFTRIAXONE (non-meningitis) <=0.12 SENSITIVE Sensitive     CEFTRIAXONE (meningitis) <=0.12 SENSITIVE Sensitive     * STREPTOCOCCUS PNEUMONIAE  SARS Coronavirus 2 by RT PCR (hospital order, performed in [place] hospital lab) Nasopharyngeal Nasopharyngeal Swab     Status: None   Collection Time: 11/15/19  6:38 PM   Specimen: Nasopharyngeal Swab  Result Value Ref Range Status   SARS Coronavirus 2 NEGATIVE NEGATIVE Final    Comment: (NOTE) SARS-CoV-2 target nucleic acids are NOT DETECTED.  The SARS-CoV-2 RNA is generally detectable in upper and lower respiratory specimens during the acute phase of infection. The lowest concentration of SARS-CoV-2 viral copies this assay can detect is 250 copies / mL. A negative result does not preclude SARS-CoV-2 infection and should not be used as the sole basis for treatment or other patient management decisions.  A negative result may occur with improper specimen collection / handling, submission of specimen other than nasopharyngeal swab, presence of viral mutation(s) within the areas targeted by this assay, and inadequate number of viral copies (<250 copies / mL). A negative result must be combined with clinical observations, patient history, and epidemiological information.  Fact Sheet for Patients:   StrictlyIdeas.no  Fact Sheet for Healthcare Providers: BankingDealers.co.za  This test is not yet approved or  cleared by the [place] FDA and has been authorized for detection and/or diagnosis of SARS-CoV-2 by FDA under an Emergency Use Authorization (EUA).   This EUA will remain in effect (meaning this test can be used) for the duration of the COVID-19 declaration under Section 564(b)(1) of the Act, 21 U.S.C. section 360bbb-3(b)(1), unless the authorization is terminated or revoked sooner.  Performed at [facility], [address] [place], [place] [address]   Blood Culture ID Panel (Reflexed)     Status: Abnormal   Collection Time: 11/15/19  6:38 PM  Result Value Ref Range Status   Enterococcus faecalis NOT DETECTED NOT DETECTED Final   Enterococcus Faecium NOT DETECTED NOT  DETECTED Final   Listeria monocytogenes NOT DETECTED NOT DETECTED Final   Staphylococcus species NOT DETECTED NOT DETECTED Final   Staphylococcus aureus (BCID) NOT DETECTED NOT DETECTED Final   Staphylococcus epidermidis NOT DETECTED NOT DETECTED Final   Staphylococcus lugdunensis NOT DETECTED NOT DETECTED Final   Streptococcus species DETECTED (A) NOT DETECTED Final    Comment: CRITICAL RESULT CALLED TO, READ BACK BY AND VERIFIED WITH: SUSAN WATSON AT 0820 ON 11/16/19 SNG    Streptococcus agalactiae NOT DETECTED NOT DETECTED Final   Streptococcus pneumoniae DETECTED (A) NOT DETECTED Final    Comment: CRITICAL RESULT CALLED TO, READ BACK BY AND VERIFIED WITH: SUSAN WATSON AT 0820 ON 11/16/19 SNG    Streptococcus pyogenes NOT DETECTED NOT DETECTED Final   A.calcoaceticus-baumannii NOT DETECTED NOT DETECTED Final   Bacteroides fragilis NOT DETECTED NOT DETECTED Final   Enterobacterales NOT DETECTED NOT DETECTED Final   Enterobacter cloacae complex NOT DETECTED NOT DETECTED Final   Escherichia coli NOT DETECTED NOT DETECTED Final   Klebsiella aerogenes NOT DETECTED NOT DETECTED Final   Klebsiella oxytoca NOT DETECTED NOT DETECTED Final   Klebsiella pneumoniae NOT DETECTED NOT DETECTED Final   Proteus species NOT DETECTED NOT DETECTED Final   Salmonella species NOT DETECTED NOT DETECTED Final   Serratia marcescens NOT DETECTED NOT DETECTED Final    Haemophilus influenzae NOT DETECTED NOT DETECTED Final   Neisseria meningitidis NOT DETECTED NOT DETECTED Final   Pseudomonas aeruginosa NOT DETECTED NOT DETECTED Final   Stenotrophomonas maltophilia NOT DETECTED NOT DETECTED Final   Candida albicans NOT DETECTED NOT DETECTED Final   Candida auris NOT DETECTED NOT DETECTED Final   Candida glabrata NOT DETECTED NOT DETECTED Final   Candida krusei NOT DETECTED NOT DETECTED Final   Candida parapsilosis NOT DETECTED NOT DETECTED Final   Candida tropicalis NOT DETECTED NOT DETECTED Final   Cryptococcus neoformans/gattii NOT DETECTED NOT DETECTED Final    Comment: Performed at [facility], [address] [place], [place] [address]  Culture, blood (Routine X 2) w Reflex to ID Panel     Status: None (Preliminary result)   Collection Time: 11/16/19 11:53 PM   Specimen: BLOOD  Result Value Ref Range Status   Specimen Description BLOOD BLOOD LEFT FOREARM  Final   Special Requests   Final    BOTTLES DRAWN AEROBIC AND ANAEROBIC Blood Culture adequate volume   Culture   Final    NO GROWTH 2 DAYS Performed at [facility], [address] [place], [place] [address]    Report Status PENDING  Incomplete  Culture, blood (Routine X 2) w Reflex to ID Panel     Status: None (Preliminary result)   Collection Time: 11/16/19 11:53 PM   Specimen: BLOOD  Result Value Ref Range Status   Specimen Description BLOOD BLOOD LEFT HAND  Final   Special Requests   Final    BOTTLES DRAWN AEROBIC AND ANAEROBIC Blood Culture adequate volume   Culture   Final    NO GROWTH 2 DAYS Performed at [facility], [address], [place], [place] [address]    Report Status PENDING  Incomplete      Studies: ECHOCARDIOGRAM COMPLETE  Result Date: 11/18/2019    ECHOCARDIOGRAM REPORT   Patient Name:   Andrea [name] Date of Exam: 11/18/2019 Medical Rec #:  [id number]        Height:       60.0 in Accession #:    [id number]  Weight:        160.5 lb Date of Birth:  February 08, 1946        BSA:          1.700 m Patient Age:    2 years         BP:           110/47 mmHg Patient Gender: F                HR:           92 bpm. Exam Location:  [facility] Procedure: 2D Echo Indications:     Bacteremia R78.81  History:         Patient has prior history of Echocardiogram examinations, most                  recent 05/27/2016.  Sonographer:     Arville Go RDCS Referring Phys:  [id number] Tsosie Billing Diagnosing Phys: Ida Rogue MD  Sonographer Comments: Technically difficult study due to poor echo windows, suboptimal apical window and suboptimal subcostal window. Image acquisition challenging due to patient body habitus. IMPRESSIONS  1. Challenging images  2. Left ventricular ejection fraction, by grossly estimated at 40 to 45% though difficult to visualize. The left ventricle has mildly decreased function. The left ventricle demonstrates regional wall motion abnormalities (images concerning for anterior wall hypokinesis). Left ventricular diastolic parameters are consistent with Grade I diastolic dysfunction (impaired relaxation).  3. Right ventricular systolic function is normal. The right ventricular size is normal.  4. Valves not well visualized. FINDINGS  Left Ventricle: Left ventricular ejection fraction, by estimation, is 40 to 45%. The left ventricle has mildly decreased function. The left ventricle demonstrates regional wall motion abnormalities. The left ventricular internal cavity size was normal in size. There is no left ventricular hypertrophy. Left ventricular diastolic parameters are consistent with Grade I diastolic dysfunction (impaired relaxation). Right Ventricle: The right ventricular size is normal. No increase in right ventricular wall thickness. Right ventricular systolic function is normal. Left Atrium: Left atrial size was normal in size. Right Atrium: Right atrial size was normal in size. Pericardium: There is no evidence of  pericardial effusion. Mitral Valve: The mitral valve is normal in structure. No evidence of mitral valve regurgitation. No evidence of mitral valve stenosis. Tricuspid Valve: The tricuspid valve is normal in structure. Tricuspid valve regurgitation is not demonstrated. No evidence of tricuspid stenosis. Aortic Valve: The aortic valve was not well visualized. Aortic valve regurgitation is not visualized. No aortic stenosis is present. Aortic valve mean gradient measures 4.0 mmHg. Aortic valve peak gradient measures 8.4 mmHg. Aortic valve area, by VTI measures 1.56 cm. Pulmonic Valve: The pulmonic valve was normal in structure. Pulmonic valve regurgitation is not visualized. No evidence of pulmonic stenosis. Aorta: The aortic root is normal in size and structure. Venous: The inferior vena cava is normal in size with greater than 50% respiratory variability, suggesting right atrial pressure of 3 mmHg. IAS/Shunts: No atrial level shunt detected by color flow Doppler.  LEFT VENTRICLE PLAX 2D LVIDd:         5.36 cm  Diastology LVIDs:         3.76 cm  LV e' medial:    5.98 cm/s LV PW:         0.90 cm  LV E/e' medial:  14.9 LV IVS:        0.82 cm  LV e' lateral:   5.66 cm/s LVOT diam:     1.90 cm  LV E/e' lateral: 15.8 LV SV:         43 LV SV Index:   26 LVOT Area:     2.84 cm  LEFT ATRIUM           Index LA diam:      3.40 cm 2.00 cm/m LA Vol (A4C): 42.2 ml 24.82 ml/m  AORTIC VALVE                   PULMONIC VALVE AV Area (Vmax):    1.36 cm    PV Vmax:       0.99 m/s AV Area (Vmean):   1.42 cm    PV Peak grad:  3.9 mmHg AV Area (VTI):     1.56 cm AV Vmax:           145.00 cm/s AV Vmean:          94.600 cm/s AV VTI:            0.278 m AV Peak Grad:      8.4 mmHg AV Mean Grad:      4.0 mmHg LVOT Vmax:         69.50 cm/s LVOT Vmean:        47.300 cm/s LVOT VTI:          0.153 m LVOT/AV VTI ratio: 0.55  AORTA Ao Root diam: 2.70 cm MITRAL VALVE MV Area (PHT): 1.90 cm     SHUNTS MV Decel Time: 400 msec     Systemic VTI:   0.15 m MV E velocity: 89.25 cm/s   Systemic Diam: 1.90 cm MV A velocity: 127.00 cm/s MV E/A ratio:  0.70 Ida Rogue MD Electronically signed by Ida Rogue MD Signature Date/Time: 11/18/2019/1:34:17 PM    Final      Scheduled Meds: . aspirin EC  81 mg Oral Daily  . atorvastatin  80 mg Oral QHS  . enoxaparin (LOVENOX) injection  40 mg Subcutaneous Q24H  . feeding supplement (GLUCERNA SHAKE)  237 mL Oral Q24H  . insulin aspart  0-20 Units Subcutaneous TID AC & HS  . insulin glargine  75 Units Subcutaneous Q supper  . lidocaine-prilocaine   Topical Once  . loratadine  10 mg Oral Daily  . losartan  25 mg Oral BH-q7a  . metoprolol succinate  25 mg Oral BH-q7a  . oxybutynin  10 mg Oral BH-q7a  . pantoprazole  40 mg Oral Daily  . sertraline  25 mg Oral Daily  . vitamin B-12  1,000 mcg Oral Daily   Continuous Infusions: . cefTRIAXone (ROCEPHIN)  IV 2 g (11/19/19 1057)  . lactated ringers 50 mL/hr at 11/19/19 1536    Principal Problem:   Sepsis ([place]) Active Problems:   Chronic systolic heart failure (HCC)   Chronic obstructive pulmonary disease (HCC)   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 edema, with long-term current use of insulin (HCC)   Coronary artery disease of native artery of native heart with stable angina pectoris ([place])   Spinal stenosis of lumbar region     Meiling Hendriks Derek Jack, [facility]  If 7PM-7AM, please contact night-coverage [email or web address] Password [email or web address] 11/19/2019, 4:18 PM    LOS: 4 days

## 2019-11-19 NOTE — Progress Notes (Signed)
Physical Therapy Treatment Patient Details Name: Andrea [name] MRN: [id number] DOB: 10-31-1945 Today's Date: 11/19/2019    History of Present Illness Andrea [name] is a 74 y/o female who was admitted for AMS and headache. PMH includes CAD, DM II, GERD, HLD, recurrent UTI, PNA, depression, CVA, and TIA.    PT Comments    Pt already up to chair at entry, agreeable to PT session. Pt takes part in chair level exercises, active assistance provided as needed. Pt improving standing capacity and standing balance. Pt tolerates standing long enough for RN to assist with pericare and linen changes to chair. Pt is slightly tangential at times, but simple cues help stay on task for session. NAD at this time, pt appears to tolerate session well.    Follow Up Recommendations  Home health PT;Supervision for mobility/OOB     Equipment Recommendations  None recommended by PT;Other (comment)    Recommendations for Other Services       Precautions / Restrictions Precautions Precautions: Fall Restrictions Weight Bearing Restrictions: No    Mobility  Bed Mobility               General bed mobility comments: recevied up in chair  Transfers Overall transfer level: Needs assistance Equipment used: Rolling walker (2 wheeled) Transfers: Sit to/from Stand Sit to Stand: Min guard         General transfer comment: 8x in session;  Ambulation/Gait                 Stairs             Wheelchair Mobility    Modified Rankin (Stroke Patients Only)       Balance                                            Cognition Arousal/Alertness: Awake/alert Behavior During Therapy: WFL for tasks assessed/performed Overall Cognitive Status: Within Functional Limits for tasks assessed                                 General Comments: somewhat tangential at times      Exercises General Exercises - Lower Extremity Long Arc Quad: AROM;Both;10  reps;Seated Hip Flexion/Marching: AROM;AAROM;Seated;Both;10 reps Other Exercises Other Exercises: Marching in place c RW support intermittent 30steps reciprocally    General Comments        Pertinent Vitals/Pain Pain Assessment: Faces Faces Pain Scale: Hurts little more Pain Location: Headache    Home Living                      Prior Function            PT Goals (current goals can now be found in the care plan section) Acute Rehab PT Goals Patient Stated Goal: To return home PT Goal Formulation: With patient Time For Goal Achievement: 12/01/19 Potential to Achieve Goals: Good Additional Goals Additional Goal #1: Pt will perform sit <> stand and stand pivot sit transfers with mod I and LRAD. Progress towards PT goals: Progressing toward goals    Frequency    Min 2X/week      PT Plan Current plan remains appropriate    Co-evaluation              AM-PAC PT "6 Clicks" Mobility  Outcome Measure  Help needed turning from your back to your side while in a flat bed without using bedrails?: None Help needed moving from lying on your back to sitting on the side of a flat bed without using bedrails?: A Lot Help needed moving to and from a bed to a chair (including a wheelchair)?: A Little Help needed standing up from a chair using your arms (e.g., wheelchair or bedside chair)?: A Little Help needed to walk in hospital room?: A Little Help needed climbing 3-5 steps with a railing? : A Lot 6 Click Score: 17    End of Session Equipment Utilized During Treatment: Gait belt;Oxygen Activity Tolerance: Patient tolerated treatment well;No increased pain Patient left: in chair;with call bell/phone within reach;with chair alarm set;with family/visitor present Nurse Communication: Mobility status;Other (comment) PT Visit Diagnosis: Unsteadiness on feet (R26.81);Other abnormalities of gait and mobility (R26.89);Muscle weakness (generalized) (M62.81);Pain      Time: 1531-1559 PT Time Calculation (min) (ACUTE ONLY): 28 min  Charges:  $Therapeutic Exercise: 23-37 mins                     4:32 PM, 11/19/19 Andrea [name], PT, DPT Physical Therapist - [facility]  [phone] ([place])    Andrea [name] C 11/19/2019, 4:32 PM

## 2019-11-19 NOTE — Progress Notes (Signed)
    [facility] has been requested to perform a transesophageal echocardiogram on Andrea [name] for streptococcus pneumoniae bacteremia.  After careful review of history and examination, the risks and benefits of transesophageal echocardiogram have been explained including risks of esophageal damage, perforation (1:10,000 risk), bleeding, pharyngeal hematoma as well as other potential complications associated with conscious sedation including aspiration, arrhythmia, respiratory failure and death. Alternatives to treatment were discussed, questions were answered. Patient is willing to proceed.   Christell Faith, PA-C 11/19/2019 12:40 PM

## 2019-11-19 NOTE — Progress Notes (Addendum)
Date of Admission:  11/15/2019     ID: Andrea [name] is a 74 y.o. female  Principal Problem:   Sepsis ([place]) Active Problems:   Chronic systolic heart failure (HCC)   Chronic obstructive pulmonary disease (HCC)   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 edema, with long-term current use of insulin (HCC)   Coronary artery disease of native artery of native heart with stable angina pectoris ([place])   Spinal stenosis of lumbar region    Subjective: Pt says she is having clear fluid dripping from her nose Has headache No fever  Medications:  . aspirin EC  81 mg Oral Daily  . atorvastatin  80 mg Oral QHS  . enoxaparin (LOVENOX) injection  40 mg Subcutaneous Q24H  . feeding supplement (GLUCERNA SHAKE)  237 mL Oral Q24H  . insulin aspart  0-20 Units Subcutaneous TID AC & HS  . insulin glargine  75 Units Subcutaneous Q supper  . lidocaine-prilocaine   Topical Once  . loratadine  10 mg Oral Daily  . losartan  25 mg Oral BH-q7a  . metoprolol succinate  25 mg Oral BH-q7a  . oxybutynin  10 mg Oral BH-q7a  . pantoprazole  40 mg Oral Daily  . vitamin B-12  1,000 mcg Oral Daily    Objective: Vital signs in last 24 hours: Temp:  [98.1 F (36.7 C)-98.9 F (37.2 C)] 98.6 F (37 C) (09/13 0740) Pulse Rate:  [100-106] 106 (09/13 0740) Resp:  [18-20] 18 (09/13 0740) BP: (126-144)/(57-76) 126/76 (09/13 0740) SpO2:  [94 %-95 %] 95 % (09/13 0740)  PHYSICAL EXAM:  General: Alert, cooperative, no distress, appears stated age.  Head: Normocephalic, without obvious abnormality, atraumatic. Eyes: Conjunctivae clear, anicteric sclerae. Pupils are equal ENT Nares normal. No drainage or sinus tenderness. Lips, mucosa, and tongue normal. No Thrush Neck: Supple,  Neurologic: Grossly non-focal  Lab Results Recent Labs    11/18/19 0809 11/19/19 0346  WBC  --  12.3*  HGB  --  10.2*  HCT  --  29.5*  NA  --  137  K  --  3.8  CL   --  105  CO2  --  23  BUN  --  24*  CREATININE 1.08* 1.06*   Liver Panel No results for input(s): PROT, ALBUMIN, AST, ALT, ALKPHOS, BILITOT, BILIDIR, IBILI in the last 72 hours. Sedimentation Rate No results for input(s): ESRSEDRATE in the last 72 hours. C-Reactive Protein No results for input(s): CRP in the last 72 hours.  Microbiology:  Studies/Results: ECHOCARDIOGRAM COMPLETE  Result Date: 11/18/2019    ECHOCARDIOGRAM REPORT   Patient Name:   Andrea [name] Date of Exam: 11/18/2019 Medical Rec #:  [id number]        Height:       60.0 in Accession #:    [id number]       Weight:       160.5 lb Date of Birth:  1945/11/08        BSA:          1.700 m Patient Age:    51 years         BP:           110/47 mmHg Patient Gender: F                HR:           92 bpm. Exam Location:  [facility] Procedure: 2D Echo Indications:  Bacteremia R78.81  History:         Patient has prior history of Echocardiogram examinations, most                  recent 05/27/2016.  Sonographer:     Arville Go RDCS Referring Phys:  [id number] Tsosie Billing Diagnosing Phys: Ida Rogue MD  Sonographer Comments: Technically difficult study due to poor echo windows, suboptimal apical window and suboptimal subcostal window. Image acquisition challenging due to patient body habitus. IMPRESSIONS  1. Challenging images  2. Left ventricular ejection fraction, by grossly estimated at 40 to 45% though difficult to visualize. The left ventricle has mildly decreased function. The left ventricle demonstrates regional wall motion abnormalities (images concerning for anterior wall hypokinesis). Left ventricular diastolic parameters are consistent with Grade I diastolic dysfunction (impaired relaxation).  3. Right ventricular systolic function is normal. The right ventricular size is normal.  4. Valves not well visualized. FINDINGS  Left Ventricle: Left ventricular ejection fraction, by estimation, is 40 to 45%. The left ventricle has  mildly decreased function. The left ventricle demonstrates regional wall motion abnormalities. The left ventricular internal cavity size was normal in size. There is no left ventricular hypertrophy. Left ventricular diastolic parameters are consistent with Grade I diastolic dysfunction (impaired relaxation). Right Ventricle: The right ventricular size is normal. No increase in right ventricular wall thickness. Right ventricular systolic function is normal. Left Atrium: Left atrial size was normal in size. Right Atrium: Right atrial size was normal in size. Pericardium: There is no evidence of pericardial effusion. Mitral Valve: The mitral valve is normal in structure. No evidence of mitral valve regurgitation. No evidence of mitral valve stenosis. Tricuspid Valve: The tricuspid valve is normal in structure. Tricuspid valve regurgitation is not demonstrated. No evidence of tricuspid stenosis. Aortic Valve: The aortic valve was not well visualized. Aortic valve regurgitation is not visualized. No aortic stenosis is present. Aortic valve mean gradient measures 4.0 mmHg. Aortic valve peak gradient measures 8.4 mmHg. Aortic valve area, by VTI measures 1.56 cm. Pulmonic Valve: The pulmonic valve was normal in structure. Pulmonic valve regurgitation is not visualized. No evidence of pulmonic stenosis. Aorta: The aortic root is normal in size and structure. Venous: The inferior vena cava is normal in size with greater than 50% respiratory variability, suggesting right atrial pressure of 3 mmHg. IAS/Shunts: No atrial level shunt detected by color flow Doppler.  LEFT VENTRICLE PLAX 2D LVIDd:         5.36 cm  Diastology LVIDs:         3.76 cm  LV e' medial:    5.98 cm/s LV PW:         0.90 cm  LV E/e' medial:  14.9 LV IVS:        0.82 cm  LV e' lateral:   5.66 cm/s LVOT diam:     1.90 cm  LV E/e' lateral: 15.8 LV SV:         43 LV SV Index:   26 LVOT Area:     2.84 cm  LEFT ATRIUM           Index LA diam:      3.40 cm 2.00  cm/m LA Vol (A4C): 42.2 ml 24.82 ml/m  AORTIC VALVE                   PULMONIC VALVE AV Area (Vmax):    1.36 cm    PV Vmax:  0.99 m/s AV Area (Vmean):   1.42 cm    PV Peak grad:  3.9 mmHg AV Area (VTI):     1.56 cm AV Vmax:           145.00 cm/s AV Vmean:          94.600 cm/s AV VTI:            0.278 m AV Peak Grad:      8.4 mmHg AV Mean Grad:      4.0 mmHg LVOT Vmax:         69.50 cm/s LVOT Vmean:        47.300 cm/s LVOT VTI:          0.153 m LVOT/AV VTI ratio: 0.55  AORTA Ao Root diam: 2.70 cm MITRAL VALVE MV Area (PHT): 1.90 cm     SHUNTS MV Decel Time: 400 msec     Systemic VTI:  0.15 m MV E velocity: 89.25 cm/s   Systemic Diam: 1.90 cm MV A velocity: 127.00 cm/s MV E/A ratio:  0.70 Ida Rogue MD Electronically signed by Ida Rogue MD Signature Date/Time: 11/18/2019/1:34:17 PM    Final      Assessment/Plan: 74 year old female with history of diabetes mellitus presents with headache and confusion Encephalopathy with pneumococcal bacteremia raises suspicion for pneumococcal meningitis.  But patient is completely awake alert and oriented x5 today with some headache.  She may have had a mild form of meningitis.  She received 1 dose of Decadron. No further doses recommended. Highly susceptible pneumococcus- will change ceftriaxone to  Penicillin She has clear fluid dripping from her nostril which could be csf rhinorrhea/CSF leak . Send  fluid for  beta 2 transferrin test of the nasal fluid Need to get neurosurgery involved   Repeat blood cultures to look for clearance 2D echo  was a limited study .  will need TEE to r/o endocarditis If she has not received her pneumococcal vaccination will get it at after completion of treatment.  Diabetes mellitus management as per primary team  Discussed the management with patient and her daughter and her nurse

## 2019-11-19 NOTE — Care Management Important Message (Signed)
Important Message  Patient Details  Name: Andrea [name] MRN: [id number] Date of Birth: [birth year]   Medicare Important Message Given:  Yes     [name] 11/19/2019, 12:05 PM

## 2019-11-20 ENCOUNTER — Encounter
Admission: EM | Disposition: A | Discharge status: Other Acute Inpatient Hospital | Payer: Self-pay | Source: Home / Self Care | Attending: Internal Medicine

## 2019-11-20 ENCOUNTER — Inpatient Hospital Stay: Payer: Medicare Other

## 2019-11-20 ENCOUNTER — Encounter: Admission: RE | Payer: Self-pay | Source: Home / Self Care

## 2019-11-20 ENCOUNTER — Ambulatory Visit: Admission: RE | Admit: 2019-11-20 | Payer: Medicare Other | Source: Home / Self Care | Admitting: Orthopedic Surgery

## 2019-11-20 ENCOUNTER — Inpatient Hospital Stay: Admit: 2019-11-20 | Payer: Medicare Other

## 2019-11-20 HISTORY — PX: TEE WITHOUT CARDIOVERSION: SHX5443

## 2019-11-20 LAB — GLUCOSE, CAPILLARY
Glucose-Capillary: 188 mg/dL — ABNORMAL HIGH (ref 70–99)
Glucose-Capillary: 181 mg/dL — ABNORMAL HIGH (ref 70–99)
Glucose-Capillary: 215 mg/dL — ABNORMAL HIGH (ref 70–99)
Glucose-Capillary: 203 mg/dL — ABNORMAL HIGH (ref 70–99)

## 2019-11-20 LAB — BASIC METABOLIC PANEL
Anion gap: 8 (ref 5–15)
BUN: 22 mg/dL (ref 8–23)
CO2: 24 mmol/L (ref 22–32)
Calcium: 8.7 mg/dL — ABNORMAL LOW (ref 8.9–10.3)
Chloride: 105 mmol/L (ref 98–111)
Creatinine, Ser: 1.05 mg/dL — ABNORMAL HIGH (ref 0.44–1.00)
GFR calc Af Amer: >60 mL/min (ref >60)
GFR calc non Af Amer: 52 mL/min — ABNORMAL LOW (ref >60)
Glucose, Bld: 237 mg/dL — ABNORMAL HIGH (ref 70–99)
Potassium: 4.2 mmol/L (ref 3.5–5.1)
Sodium: 137 mmol/L (ref 135–145)

## 2019-11-20 SURGERY — ECHOCARDIOGRAM, TRANSESOPHAGEAL
Anesthesia: Moderate Sedation

## 2019-11-20 SURGERY — REMOVAL, HARDWARE
Anesthesia: Choice | Site: Hip | Laterality: Left

## 2019-11-20 MED ORDER — ONDANSETRON HCL 4 MG/2ML IJ SOLN
INTRAMUSCULAR | Status: AC
  Filled 2019-11-20: qty 2

## 2019-11-20 MED ORDER — LIDOCAINE VISCOUS HCL 2 % MT SOLN
OROMUCOSAL | Status: AC
  Filled 2019-11-20: qty 15

## 2019-11-20 MED ORDER — LIDOCAINE HCL (PF) 1 % IJ SOLN
10.0000 mL | Freq: Once | INTRAMUSCULAR | Status: AC
  Administered 2019-11-20: 10 mL
  Filled 2019-11-20: qty 10

## 2019-11-20 MED ORDER — MIDAZOLAM HCL 5 MG/5ML IJ SOLN
INTRAMUSCULAR | Status: AC
  Filled 2019-11-20: qty 5

## 2019-11-20 MED ORDER — FENTANYL CITRATE (PF) 100 MCG/2ML IJ SOLN
INTRAMUSCULAR | Status: AC
  Filled 2019-11-20: qty 2

## 2019-11-20 MED ORDER — MORPHINE SULFATE (PF) 2 MG/ML IV SOLN
INTRAVENOUS | Status: AC
  Filled 2019-11-20: qty 1

## 2019-11-20 MED ORDER — IPRATROPIUM-ALBUTEROL 0.5-2.5 (3) MG/3ML IN SOLN
RESPIRATORY_TRACT | Status: AC
  Filled 2019-11-20: qty 3

## 2019-11-20 MED ORDER — SODIUM CHLORIDE FLUSH 0.9 % IV SOLN
INTRAVENOUS | Status: AC
  Filled 2019-11-20: qty 10

## 2019-11-20 MED ORDER — IOHEXOL 300 MG/ML  SOLN
10.0000 mL | Freq: Once | INTRAMUSCULAR | Status: AC | PRN
  Administered 2019-11-20: 10 mL

## 2019-11-20 MED ORDER — SODIUM CHLORIDE 0.9 % IV SOLN: INTRAVENOUS | Status: DC

## 2019-11-20 NOTE — Progress Notes (Signed)
Patient received for TEE procedure.  On arrival patient c/o chest pressure mid chest 6/10 with associated nausea, dyspnea at rest lung sounds with biphasic wheezes, rhonchi and fine crackles in all lobes  12 ekg obtained and chris end, md and  ryan dunn, pa-c notified of the above assessment  Morphine 2mg  iv, zofran 4mg  iv , and duoneb tx given to patient.  Report called to receiving RN

## 2019-11-20 NOTE — Progress Notes (Signed)
PROGRESS NOTE    Andrea [name]   QJJ:[id number]  DOB: [birth year]  DOA: 11/15/2019     5  PCP: Rubye Beach  CC: fever, AMS  Hospital Course: Andrea [name] is a 74 year old female with CAD, DM 2 and recurrent UTI and pneumonia who was admitted 11/15/2019 with fever, leukocytosis and altered mental status.  Patient was started on broad-spectrum antibiotics to cover treatment of meningitis although patient refused lumbar puncture.  She was treated with cefepime, vancomycin, ampicillin, metronidazole and acyclovir.  Blood cultures became positive for strep pneumonia with no clear source as chest x-ray was negative initially.   Antibiotics were deescalated to vancomycin and ceftriaxone.  Patient improved clinically with resumption of normal mental status.  She was seen by ID who recommended continue ceftriaxone for 14 days until MIC of pneumococcus has returned Once sensitivies returned she was de-escalated further to PCN.  Vancomycin has been discontinued.  On 11/20/19 she was to undergo TEE however developed worsening cough/SOB. She then underwent a CXR which showed bilateral upper airspace opacities concerning for PNA.   She also had developed clear drainage from her nose on 11/19/19 with an ongoing headache which raised concern for a CSF leak. Neurosurgery was also consulted. A beta 2 transferrin was sent for testing and patient also underwent a CT cisternogram.    Interval History:  Patient resting in bed with daughter bedside today.  She endorses ongoing headache and states she continued to have ongoing drainage from her nares of clear fluid.  A sample was sent earlier today for testing as well.  Old records reviewed in assessment of this patient  ROS: Constitutional: negative for chills and fevers, Respiratory: positive for cough, Cardiovascular: negative for chest pain and Gastrointestinal: negative for abdominal pain  Assessment & Plan: Sepsis secondary to pneumococcal  bacteremia complicated by acute metabolic encephalopathy Patient is much improved with aggressive treatment, now consolidated from ceftriaxone to PCN for treatment of pneumococcal bacteremia. Of note patient notes persistent headache which she apparently had been having since prior to admission.  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 After further discussion with patient, she would consent to an LP if needed again in the future.  - continue PCN - patient became SOB prior to TEE which was cancelled for now She will need 14 days of IV antibiotics per ID for treatment of presumed meningitis  PICC line can be placed once endocarditis has ruled out  Nasal drainage - given ongoing HA, some concern for possible CSF leak - Neurosurgery consulted for additional recommendations and evaluation - follow up beta 2 transferrin - follow up CT cisternogram  Headache This may be secondary to resolving meningitis However patient states she thought Claritin was effective at home, will start Claritin here Continue as needed Norco which has been effective for headache per patient, she had initially been getting it for her hip but she notes that it makes her headache much better.  Acute metabolic encephalopathy Resolved, patient is back to baseline  DM2 - continue Lantus; adjust as needed - continue SSI and CBGs  Chronic LBP/hip pain with spinal stenosis Continue Norco  COPD No evidence for acute flare Continue O2 at home doses As needed inhaled bronchodilators  CAD No chest pain, continue metoprolol, losartan, aspirin and statin  Antimicrobials: Ampicillin 9/9>>9/10 Cefepime 9/10 x 1 Rocephin 9/9>>9/13 Flagyl 9/10 x 2 Vanc 9/9 >>  9/12 PCN G 9/13 >> current   DVT prophylaxis: Lovenox Code Status: Full Family Communication: Daughter bedside Disposition Plan: Status is:  Inpatient  Remains inpatient appropriate because:Ongoing diagnostic testing needed not appropriate for outpatient work up, Unsafe d/c plan, IV treatments appropriate due to intensity of illness or inability to take PO and Inpatient level of care appropriate due to severity of illness   Dispo: The patient is from: Home              Anticipated d/c is to: Home              Anticipated d/c date is: 3 days              Patient currently is not medically stable to d/c.       Objective: Blood pressure (!) 148/52, pulse 99, temperature 98.4 F (36.9 C), temperature source Oral, resp. rate 20, height 5' (1.524 m), weight 72.8 kg, SpO2 98 %.  Examination: General appearance: alert, cooperative and no distress Head: Normocephalic, without obvious abnormality, atraumatic Eyes: EOMI Lungs: coarse breath sounds bilaterally Heart: regular rate and rhythm and S1, S2 normal Abdomen: normal findings: bowel sounds normal and soft, non-tender Extremities: no edema Skin: mobility and turgor normal Neurologic: Grossly normal  Consultants:   ID  Neurosurgery  Cardiology for TEE  Procedures:   n/a  Data Reviewed: I have personally reviewed following labs and imaging studies Results for orders placed or performed during the hospital encounter of 11/15/19 (from the past 24 hour(s))  Glucose, capillary     Status: Abnormal   Collection Time: 11/19/19  9:50 PM  Result Value Ref Range   Glucose-Capillary 334 (H) 70 - 99 mg/dL   Comment 1 Notify RN   Basic metabolic panel     Status: Abnormal   Collection Time: 11/20/19  3:15 AM  Result Value Ref Range   Sodium 137 135 - 145 mmol/L   Potassium 4.2 3.5 - 5.1 mmol/L   Chloride 105 98 - 111 mmol/L   CO2 24 22 - 32 mmol/L   Glucose, Bld 237 (H) 70 - 99 mg/dL   BUN 22 8 - 23 mg/dL   Creatinine, Ser 1.05 (H) 0.44 - 1.00 mg/dL   Calcium 8.7 (L) 8.9 - 10.3 mg/dL   GFR calc non Af Amer 52 (L) >60 mL/min   GFR calc Af Amer >60 >60 mL/min    Anion gap 8 5 - 15  Glucose, capillary     Status: Abnormal   Collection Time: 11/20/19  7:31 AM  Result Value Ref Range   Glucose-Capillary 188 (H) 70 - 99 mg/dL  Glucose, capillary     Status: Abnormal   Collection Time: 11/20/19  1:37 PM  Result Value Ref Range   Glucose-Capillary 181 (H) 70 - 99 mg/dL  Glucose, capillary     Status: Abnormal   Collection Time: 11/20/19  4:23 PM  Result Value Ref Range   Glucose-Capillary 215 (H) 70 - 99 mg/dL    Recent Results (from the past 240 hour(s))  Urine culture     Status: Abnormal   Collection Time: 11/15/19 11:03 AM   Specimen: In/Out Cath Urine  Result Value Ref Range Status   Specimen Description   Final    IN/OUT CATH URINE Performed at [facility], [address], [place], [place] [address]    Special Requests   Final    NONE Performed at [facility], [address] [place], [place] [address]  Culture >=100,000 COLONIES/mL ESCHERICHIA COLI (A)  Final   Report Status 11/18/2019 FINAL  Final   Organism ID, Bacteria ESCHERICHIA COLI (A)  Final      Susceptibility   Escherichia coli - MIC*    AMPICILLIN >=32 RESISTANT Resistant     CEFAZOLIN <=4 SENSITIVE Sensitive     CEFTRIAXONE <=0.25 SENSITIVE Sensitive     CIPROFLOXACIN <=0.25 SENSITIVE Sensitive     GENTAMICIN <=1 SENSITIVE Sensitive     IMIPENEM <=0.25 SENSITIVE Sensitive     NITROFURANTOIN <=16 SENSITIVE Sensitive     TRIMETH/SULFA <=20 SENSITIVE Sensitive     AMPICILLIN/SULBACTAM 8 SENSITIVE Sensitive     PIP/TAZO <=4 SENSITIVE Sensitive     * >=100,000 COLONIES/mL ESCHERICHIA COLI  Blood culture (routine x 2)     Status: Abnormal   Collection Time: 11/15/19  6:38 PM   Specimen: BLOOD  Result Value Ref Range Status   Specimen Description   Final    BLOOD LEFT ANTECUBITAL Performed at [facility], [place] [address] [place], [place] [address]    Special Requests   Final    BOTTLES DRAWN AEROBIC AND ANAEROBIC Blood Culture results  may not be optimal due to an excessive volume of blood received in culture bottles Performed at [facility], [address] [place], [place] [address]    Culture  Setup Time   Final    Organism ID to follow [facility] BOTTLES CRITICAL RESULT CALLED TO, READ BACK BY AND VERIFIED WITH: SUSAN WATSON AT 0820 ON 11/16/19 SNG Performed at [facility], [address] [place], [place] [address]    Culture STREPTOCOCCUS PNEUMONIAE (A)  Final   Report Status 11/18/2019 FINAL  Final   Organism ID, Bacteria STREPTOCOCCUS PNEUMONIAE  Final      Susceptibility   Streptococcus pneumoniae - MIC*    ERYTHROMYCIN <=0.12 SENSITIVE Sensitive     LEVOFLOXACIN 0.5 SENSITIVE Sensitive     VANCOMYCIN 0.5 SENSITIVE Sensitive     PENICILLIN (meningitis) <=0.06 SENSITIVE Sensitive     PENO - penicillin <=0.06      PENICILLIN (non-meningitis) <=0.06 SENSITIVE Sensitive     PENICILLIN (oral) <=0.06 SENSITIVE Sensitive     CEFTRIAXONE (non-meningitis) <=0.12 SENSITIVE Sensitive     CEFTRIAXONE (meningitis) <=0.12 SENSITIVE Sensitive     * STREPTOCOCCUS PNEUMONIAE  SARS Coronavirus 2 by RT PCR (hospital order, performed in [place] hospital lab) Nasopharyngeal Nasopharyngeal Swab     Status: None   Collection Time: 11/15/19  6:38 PM   Specimen: Nasopharyngeal Swab  Result Value Ref Range Status   SARS Coronavirus 2 NEGATIVE NEGATIVE Final    Comment: (NOTE) SARS-CoV-2 target nucleic acids are NOT DETECTED.  The SARS-CoV-2 RNA is generally detectable in upper and lower respiratory specimens during the acute phase of infection. The lowest concentration of SARS-CoV-2 viral copies this assay can detect is 250 copies / mL. A negative result does not preclude SARS-CoV-2 infection and should not be used as the sole basis for treatment or other patient management decisions.  A negative result may occur with improper specimen collection / handling,  submission of specimen other than nasopharyngeal swab, presence of viral mutation(s) within the areas targeted by this assay, and inadequate number of viral copies (<250 copies / mL). A negative result must be combined with clinical observations, patient history, and epidemiological information.  Fact Sheet for Patients:   StrictlyIdeas.no  Fact Sheet for Healthcare Providers: BankingDealers.co.za  This  test is not yet approved or  cleared by the [place] and has been authorized for detection and/or diagnosis of SARS-CoV-2 by FDA under an Emergency Use Authorization (EUA).  This EUA will remain in effect (meaning this test can be used) for the duration of the COVID-19 declaration under Section 564(b)(1) of the Act, 21 U.S.C. section 360bbb-3(b)(1), unless the authorization is terminated or revoked sooner.  Performed at [facility], [address] [place], [place] [address]   Blood Culture ID Panel (Reflexed)     Status: Abnormal   Collection Time: 11/15/19  6:38 PM  Result Value Ref Range Status   Enterococcus faecalis NOT DETECTED NOT DETECTED Final   Enterococcus Faecium NOT DETECTED NOT DETECTED Final   Listeria monocytogenes NOT DETECTED NOT DETECTED Final   Staphylococcus species NOT DETECTED NOT DETECTED Final   Staphylococcus aureus (BCID) NOT DETECTED NOT DETECTED Final   Staphylococcus epidermidis NOT DETECTED NOT DETECTED Final   Staphylococcus lugdunensis NOT DETECTED NOT DETECTED Final   Streptococcus species DETECTED (A) NOT DETECTED Final    Comment: CRITICAL RESULT CALLED TO, READ BACK BY AND VERIFIED WITH: SUSAN WATSON AT 0820 ON 11/16/19 SNG    Streptococcus agalactiae NOT DETECTED NOT DETECTED Final   Streptococcus pneumoniae DETECTED (A) NOT DETECTED Final    Comment: CRITICAL RESULT CALLED TO, READ BACK BY AND VERIFIED WITH: SUSAN WATSON AT 0820 ON 11/16/19 SNG    Streptococcus pyogenes NOT  DETECTED NOT DETECTED Final   A.calcoaceticus-baumannii NOT DETECTED NOT DETECTED Final   Bacteroides fragilis NOT DETECTED NOT DETECTED Final   Enterobacterales NOT DETECTED NOT DETECTED Final   Enterobacter cloacae complex NOT DETECTED NOT DETECTED Final   Escherichia coli NOT DETECTED NOT DETECTED Final   Klebsiella aerogenes NOT DETECTED NOT DETECTED Final   Klebsiella oxytoca NOT DETECTED NOT DETECTED Final   Klebsiella pneumoniae NOT DETECTED NOT DETECTED Final   Proteus species NOT DETECTED NOT DETECTED Final   Salmonella species NOT DETECTED NOT DETECTED Final   Serratia marcescens NOT DETECTED NOT DETECTED Final   Haemophilus influenzae NOT DETECTED NOT DETECTED Final   Neisseria meningitidis NOT DETECTED NOT DETECTED Final   Pseudomonas aeruginosa NOT DETECTED NOT DETECTED Final   Stenotrophomonas maltophilia NOT DETECTED NOT DETECTED Final   Candida albicans NOT DETECTED NOT DETECTED Final   Candida auris NOT DETECTED NOT DETECTED Final   Candida glabrata NOT DETECTED NOT DETECTED Final   Candida krusei NOT DETECTED NOT DETECTED Final   Candida parapsilosis NOT DETECTED NOT DETECTED Final   Candida tropicalis NOT DETECTED NOT DETECTED Final   Cryptococcus neoformans/gattii NOT DETECTED NOT DETECTED Final    Comment: Performed at [facility], [address] [place], [place] [address]  Culture, blood (Routine X 2) w Reflex to ID Panel     Status: None (Preliminary result)   Collection Time: 11/16/19 11:53 PM   Specimen: BLOOD  Result Value Ref Range Status   Specimen Description BLOOD BLOOD LEFT FOREARM  Final   Special Requests   Final    BOTTLES DRAWN AEROBIC AND ANAEROBIC Blood Culture adequate volume   Culture   Final    NO GROWTH 3 DAYS Performed at [facility], [address] [place], [place] [address]    Report Status PENDING  Incomplete  Culture, blood (Routine X 2) w Reflex to ID Panel     Status: None (Preliminary result)   Collection  Time: 11/16/19 11:53 PM   Specimen: BLOOD  Result Value Ref  Range Status   Specimen Description BLOOD BLOOD LEFT HAND  Final   Special Requests   Final    BOTTLES DRAWN AEROBIC AND ANAEROBIC Blood Culture adequate volume   Culture   Final    NO GROWTH 3 DAYS Performed at [facility], [address], [place], [place] [address]    Report Status PENDING  Incomplete     Radiology Studies: DG Chest Port 1 View  Result Date: 11/20/2019 CLINICAL DATA:  Shortness of breath EXAM: PORTABLE CHEST 1 VIEW COMPARISON:  November 15, 2019 FINDINGS: Ill-defined airspace opacity is noted in each upper lobe. Lungs elsewhere clear. Heart size and pulmonary vascularity are normal. No adenopathy. There is aortic atherosclerosis. Bones are osteoporotic. IMPRESSION: Ill-defined airspace opacity in each upper lobe, concerning for foci of pneumonia. Lungs elsewhere clear. Stable cardiac silhouette. Aortic Atherosclerosis (ICD10-I70.0). Electronically Signed   By: Lowella Grip III M.D.   On: 11/20/2019 13:38   DG FLUORO GUIDED NEEDLE PLC ASPIRATION/INJECTION LOC  Result Date: 11/20/2019 CLINICAL DATA:  Rhinorrhea, meningitis, evaluate for spontaneous CSF leak. Contrast injection requested for CT cisternogram. EXAM: INTRATHECAL INJECTION UNDER FLUOROSCOPY FLUOROSCOPY TIME:  24 seconds TECHNIQUE: The procedure, risks (including but not limited to bleeding, infection, organ damage ), benefits, and alternatives were explained to the patient. Questions regarding the procedure were encouraged and answered. The patient understands and consents to the procedure. An appropriate skin entry site was determined fluoroscopically. Operator donned sterile gloves and mask. Skin site was marked, then prepped with Betadine, draped in usual sterile fashion, and infiltrated locally with 1% lidocaine. A 20 gauge spinal needle advanced into the thecal sac at L2-3 from a left interlaminar approach. 10 mL Omnipaque 300 was then  administered intrathecally. The needle was then removed. Using protocol table tilt maneuver, the contrast was directed intracranially with fluoroscopic confirmation. The patient tolerated the procedure well. COMPLICATIONS: None immediate IMPRESSION: 1. Technically successful intrathecal contrast injection under fluoroscopy. Electronically Signed   By: Lucrezia Europe M.D.   On: 11/20/2019 14:49   DG FLUORO GUIDED NEEDLE PLC ASPIRATION/INJECTION LOC  Final Result    DG Chest Port 1 View  Final Result    DG Chest Port 1 View  Final Result    CT Head Wo Contrast  Final Result    CT MAXILLOFACIAL W & WO CONTRAST    (Results Pending)    Scheduled Meds: . aspirin EC  81 mg Oral Daily  . atorvastatin  80 mg Oral QHS  . enoxaparin (LOVENOX) injection  40 mg Subcutaneous Q24H  . feeding supplement (GLUCERNA SHAKE)  237 mL Oral Q24H  . insulin aspart  0-20 Units Subcutaneous TID AC & HS  . insulin glargine  75 Units Subcutaneous Q supper  . ipratropium-albuterol      . lidocaine-prilocaine   Topical Once  . loratadine  10 mg Oral Daily  . losartan  25 mg Oral BH-q7a  . metoprolol succinate  25 mg Oral BH-q7a  . morphine      . ondansetron      . oxybutynin  10 mg Oral BH-q7a  . pantoprazole  40 mg Oral Daily  . sertraline  25 mg Oral Daily  . sodium chloride flush      . vitamin B-12  1,000 mcg Oral Daily   PRN Meds: acetaminophen **OR** acetaminophen, aspirin-acetaminophen-caffeine, HYDROcodone-acetaminophen, ipratropium-albuterol, morphine injection, ondansetron **OR** ondansetron (ZOFRAN) IV Continuous Infusions: . penicillin g continuous IV infusion Stopped (11/20/19 0920)      LOS: 5 days  Time spent: Greater than 50% of the 35 minute visit was spent in counseling/coordination of care for the patient as laid out in the A&P.   Dwyane Dee, MD [facility] 11/20/2019, 5:44 PM  Contact via secure chat.  To contact the attending provider between 7A-7P or the covering  provider during after hours 7P-7A, please log into the web site [email or web address] and access using universal [place] password for that web site. If you do not have the password, please call the hospital operator.

## 2019-11-20 NOTE — Progress Notes (Signed)
OT Cancellation Note  Patient Details Name: Andrea [name] MRN: [id number] DOB: 1945-04-08   Cancelled Treatment:    Reason Eval/Treat Not Completed: Patient declined, no reason specified. Multiple attempts made to see pt today. Pt declines OT intervention and reports, " I will try tomorrow. I have just had a lot happen today." OT will reattempt at next available time.   Darleen Crocker, [place], OTR/L , CBIS ascom [phone]  11/20/19, 3:33 PM   11/20/2019, 3:31 PM

## 2019-11-20 NOTE — Consult Note (Signed)
Referring Physician:  No referring provider defined for this encounter.  Primary Physician:  Mar Daring, PA-C  Chief Complaint: headache and runny nose x 2-3 weeks  History of Present Illness: Andrea [name] is a 74 y.o. female who presents with the chief complaint of headache and rhinorrhea x 2-3 weeks. She does not recall the events that brought her to the hospital, as she was "out of it" however her daughter is at bedside and does report that she called EMS for her mother, as she was experiencing altered mental status, fever, and chills.  She said she had spoken to her mother on the phone prior to calling EMS and she was confused, slurring her words, and not oriented, unable to get her thoughts out or words out.  Thus she was brought to [facility] for further evaluation.  Her initial diagnosis was sepsis with acute metabolic encephalopathy.  Blood cultures taken on 11/15/2019 were positive for strep pneumonia.  ID has been consulted, and due to her rhinorrhea and persistent headaches, it was recommended to consult neurosurgery.  Nasal drainage fluid has been sent to the lab for evaluation for beta transferrin which would be concerning for CSF leak, results are still pending.  [name] does have longstanding lower back pain however does not endorse that this is any worse than her usual today on exam.  On exam, she has no evidence of focal neurologic deficit.  She is alert and oriented x4.  She does complain of a persistent frontal headache without photophobia.  Denies any neck pain.  Has been ambulating with a walker in the room.  Does endorse persistent rhinorrhea for the last 2 to 3 weeks.   Review of Systems:  A 10 point review of systems is negative, except for the pertinent positives and negatives detailed in the HPI.  Past Medical History: Past Medical History:  Diagnosis Date  . Bronchitis   . Chickenpox   . Coronary artery disease   . Depression   . Diabetes mellitus  type 2, uncomplicated (Andrea [name])   . Diabetes mellitus without complication ([place])   . Diabetic retinopathy (Andrea [name])   . Dupuytren contracture   . Frequent urinary tract infections   . GERD (gastroesophageal reflux disease)   . History of hand surgery 2011   left hand  . Hyperlipidemia, unspecified   . Irritable bowel syndrome   . MI (myocardial infarction) (Andrea [name]) 1994  . Neuromuscular disorder (Andrea [name])    nerve pain in back and legs  . Osteoporosis   . Pneumonia 2014  . Stroke (Andrea [name])   . TIA (transient ischemic attack)   . Vitamin D deficiency, unspecified   . Wheezing     Past Surgical History: Past Surgical History:  Procedure Laterality Date  . CARDIAC CATHETERIZATION  1994  . COLONOSCOPY     x3  . COLONOSCOPY WITH PROPOFOL N/A 02/08/2019   Procedure: COLONOSCOPY WITH PROPOFOL;  Surgeon: [name], MD;  Location: [facility] ENDOSCOPY;  Service: Gastroenterology;  Laterality: N/A;  . CORONARY ANGIOPLASTY  1994  . EYE SURGERY Bilateral    cataracts  . HAND SURGERY Left 2011   left hand release of contractures  . HIP FRACTURE SURGERY  11/2013  . PR COLSC FLX W/REMOVAL LESION BY HOT BX FORCEPS   08/21/2015   Procedure: COLONOSCOPY, FLEXIBLE, PROXIMAL TO SPLENIC FLEXURE; W/REMOVAL TUMOR/POLYP/OTHER LESION, HOT BX FORCEP/CAUTE; Surgeon: Carlena Hurl, MD; Location: OR Andrea [name]; Service: General Surgery    Allergies: Allergies as of 11/15/2019 - Review Complete  11/15/2019  Allergen Reaction Noted  . Metformin and related Diarrhea 07/08/2015  . Sulfa antibiotics Rash 07/08/2015    Medications:  Current Facility-Administered Medications:  .  acetaminophen (TYLENOL) tablet 650 mg, 650 mg, Oral, Q6H PRN, 650 mg at 11/17/19 1008 **OR** acetaminophen (TYLENOL) suppository 650 mg, 650 mg, Rectal, Q6H PRN, Jonelle Sidle, Mohammad L, MD .  aspirin EC tablet 81 mg, 81 mg, Oral, Daily, Gala Romney L, MD, 81 mg at 11/20/19 0924 .  aspirin-acetaminophen-caffeine (EXCEDRIN MIGRAINE) per tablet  1 tablet, 1 tablet, Oral, Q8H PRN, Vashti Hey, MD, 1 tablet at 11/20/19 0918 .  atorvastatin (LIPITOR) tablet 80 mg, 80 mg, Oral, QHS, Garba, Mohammad L, MD, 80 mg at 11/19/19 2215 .  enoxaparin (LOVENOX) injection 40 mg, 40 mg, Subcutaneous, Q24H, Garba, Mohammad L, MD, 40 mg at 11/18/19 2307 .  feeding supplement (GLUCERNA SHAKE) (GLUCERNA SHAKE) liquid 237 mL, 237 mL, Oral, Q24H, Fritzi Mandes, MD, 237 mL at 11/19/19 1008 .  fentaNYL (SUBLIMAZE) 100 MCG/2ML injection, , , ,  .  HYDROcodone-acetaminophen (NORCO) 10-325 MG per tablet 1 tablet, 1 tablet, Oral, Q8H PRN, Elwyn Reach, MD, 1 tablet at 11/19/19 2235 .  insulin aspart (novoLOG) injection 0-20 Units, 0-20 Units, Subcutaneous, TID AC & HS, Sharion Settler, NP, 4 Units at 11/20/19 0747 .  insulin glargine (LANTUS) injection 75 Units, 75 Units, Subcutaneous, Q supper, Vashti Hey, MD, 75 Units at 11/19/19 1730 .  ipratropium-albuterol (DUONEB) 0.5-2.5 (3) MG/3ML nebulizer solution 3 mL, 3 mL, Nebulization, Q6H PRN, Bonnell Public Tublu, MD, 3 mL at 11/20/19 1034 .  ipratropium-albuterol (DUONEB) 0.5-2.5 (3) MG/3ML nebulizer solution, , , ,  .  lidocaine (XYLOCAINE) 2 % viscous mouth solution, , , ,  .  lidocaine-prilocaine (EMLA) cream, , Topical, Once, Duffy Bruce, MD .  loratadine (CLARITIN) tablet 10 mg, 10 mg, Oral, Daily, Sharion Settler, NP, 10 mg at 11/20/19 0919 .  losartan (COZAAR) tablet 25 mg, 25 mg, Oral, BH-q7a, Gala Romney L, MD, 25 mg at 11/20/19 1017 .  metoprolol succinate (TOPROL-XL) 24 hr tablet 25 mg, 25 mg, Oral, BH-q7a, Gala Romney L, MD, 25 mg at 11/20/19 0924 .  midazolam (VERSED) 5 MG/5ML injection, , , ,  .  morphine 2 MG/ML injection 2 mg, 2 mg, Intravenous, Q4H PRN, Gala Romney L, MD, 2 mg at 11/20/19 1028 .  morphine 2 MG/ML injection, , , ,  .  ondansetron (ZOFRAN) 4 MG/2ML injection, , , ,  .  ondansetron (ZOFRAN) tablet 4 mg, 4 mg, Oral, Q6H PRN **OR**  ondansetron (ZOFRAN) injection 4 mg, 4 mg, Intravenous, Q6H PRN, Jonelle Sidle, Mohammad L, MD, 4 mg at 11/20/19 1029 .  oxybutynin (DITROPAN-XL) 24 hr tablet 10 mg, 10 mg, Oral, BH-q7a, Gala Romney L, MD, 10 mg at 11/20/19 0923 .  pantoprazole (PROTONIX) EC tablet 40 mg, 40 mg, Oral, Daily, Jonelle Sidle, Mohammad L, MD, 40 mg at 11/19/19 1008 .  penicillin G potassium 12 Million Units in dextrose 5 % 500 mL continuous infusion, 12 Million Units, Intravenous, Q12H, Tsosie Billing, MD, Held at 11/20/19 0920 .  sertraline (ZOLOFT) tablet 25 mg, 25 mg, Oral, Daily, Bonnell Public Tublu, MD, 25 mg at 11/20/19 0919 .  sodium chloride flush 0.9 % injection, , , ,  .  vitamin B-12 (CYANOCOBALAMIN) tablet 1,000 mcg, 1,000 mcg, Oral, Daily, Elwyn Reach, MD, 1,000 mcg at 11/20/19 5102   Social History: Social History   Tobacco Use  . Smoking status: Former Smoker  Packs/day: 1.00    Years: 30.00    Pack years: 30.00    Types: Cigarettes    Quit date: 07/17/2012    Years since quitting: 7.3  . Smokeless tobacco: Never Used  . Tobacco comment:    Vaping Use  . Vaping Use: Never used  Substance Use Topics  . Alcohol use: Not Currently    Alcohol/week: 0.0 standard drinks  . Drug use: No    Family Medical History: Family History  Problem Relation Age of Onset  . Depression Daughter   . Arthritis Daughter        spine  . Plantar fasciitis Daughter   . Anxiety disorder Daughter   . Hyperlipidemia Daughter   . AAA (abdominal aortic aneurysm) Daughter   . Hypertension Mother   . Stroke Mother   . Cataracts Mother   . Depression Mother   . Heart disease Mother   . Asthma Brother   . Diabetes Daughter   . Hyperlipidemia Daughter   . Hypertension Daughter     Physical Examination: Vitals:   11/20/19 0958 11/20/19 1107  BP: (!) 142/98 (!) 167/67  Pulse: (!) 101 96  Resp: (!) 28 20  Temp: 98.5 F (36.9 C) 98.6 F (37 C)  SpO2: 95% 97%     General: Patient is well  developed, well nourished, calm, collected, and in no apparent distress.  Psychiatric: Patient is non-anxious.  Head:  Pupils equal, round, and reactive to light.  ENT:  Oral mucosa appears dry  Neck:   Supple.  Full range of motion.  No nuchal rigidity noted.  No tenderness to palpation  Respiratory: Patient appears to be in mild respiratory distress.  NEUROLOGICAL:  General: In no acute distress.   Awake, alert, oriented to person, place, and time.   Speech fluent and clear, no dysarthria.  Able to name 3 out of 3 objects correctly, repeats simple phrases, count backwards from 10 without difficulty. Pupils equal round and reactive to light. EOMI, no nystagmus. Facial tone is symmetric.  Tongue protrusion is midline.  There is no pronator drift. Moving all extremities equally.  ROM of spine: Full to cervical spine, thoracic and lumbar spine not evaluated. palpation of spine: nontender.    Strength: Side Biceps Triceps Deltoid Interossei Grip  R 5 5 5 5 5   L 5 5 5 5 5    Side Iliopsoas Quads Hamstring PF DF EHL  R 5 5 5 5 5 5   L 5 5 5 5 5 5    Patient does have prior left hip surgery and therefore is guarding left leg due to pain, but appears to be full strength. Bilateral upper and lower extremity sensation is intact to light touch.  Toes are down-going. Gait not tested. Hoffman's is absent.  Imaging: CT Head 11/15/2019: CLINICAL DATA:  Headache  EXAM: CT HEAD WITHOUT CONTRAST  TECHNIQUE: Contiguous axial images were obtained from the base of the skull through the vertex without intravenous contrast.  COMPARISON:  None.  FINDINGS: Brain: No evidence of acute infarction, hemorrhage, hydrocephalus, extra-axial collection or mass lesion/mass effect. Mild age related cerebral volume loss.  Vascular: Atherosclerotic calcifications involving the large vessels of the skull base. No unexpected hyperdense vessel.  Skull: Normal. Negative for fracture or focal  lesion.  Sinuses/Orbits: Mild mucosal thickening within the left ethmoid air cells. Remaining visualized paranasal sinuses and mastoid air cells are clear. Orbital structures intact.  Other: None.  IMPRESSION: 1. No acute intracranial findings. 2. Mild left ethmoid sinus  disease.  Assessment and Plan: Ms. Kanner is a pleasant 74 y.o. female who was admitted to the [facility] hospital with concerns for acute metabolic encephalopathy as well as sepsis.  Given her continued frontal headache as well as rhinorrhea, neurosurgery has been consulted.  - Appreciate ID recommendations and management - Agree with nasal fluid collection for evaluation of presence of beta transferrin to identify CSF rhinorrhea - We will await results; if positive, patient will need to undergo a CT cisternogram to identify leak  - No acute neurosurgical intervention at this time  We thank you for this consult.   Lonell Face, NP Dept. of Neurosurgery

## 2019-11-20 NOTE — Hospital Course (Addendum)
[name] is a 74 year old female with CAD, DM 2, recurrent UTI and pneumonia who was admitted 11/15/2019 with fever, leukocytosis (21.5k), and altered mental status.  Patient was started on broad-spectrum antibiotics to cover treatment of meningitis although patient refused lumbar puncture on admission.  She was treated with cefepime, vancomycin, ampicillin, metronidazole and acyclovir.   Due to high suspicion for meningitis despite refusal of LP she was continued on treatment. Blood cultures became positive for strep pna and her abx were slowly de-escalated.  Antibiotics were deescalated to vancomycin and ceftriaxone.  She  improved clinically with resumption of normal mental status however did have lingering headache (6/10 most days, needing morphine for relief).  She was seen by ID who recommended continuing ceftriaxone for 14 days until MIC of pneumococcus returned. Once sensitivies returned she was de-escalated to PCN however, once CSF leak was discovered she was broadened back to Rocephin (see A&P below for more specific details).   On 11/19/19 she developed a clear nasal discharge from her left nare that appeared more CSF in consistency than mucous. A beta-2 transferrin was sent for testing and ultimately returned positive.   On 11/20/19 she was to undergo TEE however developed worsening cough/SOB. She then underwent a CXR which showed bilateral upper airspace opacities concerning for PNA. Clinically she did improve from this perspective (down trending WBC, remained afebrile, improving O2 status-down to 2L, no significant cough nor SOB). However, her CXR was repeated to re-evaluate on 9/18 and did show increase in opacities but as noted, she had no clinical deterioration. She was given a dose of IV lasix on 9/18 as well in case of some superimposed volume overload. Incentive spirometry was also ordered and encourarged; she likely has some atelectasis as well given prolonged hospitalization and minimal  activity.  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  She also underwent a CT cisternogram on 9/14 to confirm the leak.   Her case was discussed at length with multiple transfer facilities, the patient, and her daughters. She is accepted by Dr. Raechel Ache at [facility] for transfer and further evaluation to decide on next course of action in regards to repairing her CSF leak.  Patient and daughters were informed of transfer acceptance and in agreement.

## 2019-11-20 NOTE — Progress Notes (Addendum)
Pt obtained skin tears on left hand and posterior forearm during transportation from floor to/from radiology. Per report from transportation personnel, how it was obtained, not certain. Cleansed skin tear with saline, Xeroform gauze, non-adhesive gauze  in place, Kerlix wrap, and secured with paper tape.

## 2019-11-20 NOTE — Progress Notes (Signed)
CXR showing foci of bilateral upper lobe PNA without evidence of edema or effusions. Recommend ongoing management per IM. TEE can be revisited when her respiratory status has improved.

## 2019-11-20 NOTE — Progress Notes (Signed)
OT Cancellation Note  Patient Details Name: Andrea [name] MRN: [id number] DOB: 05-Jan-1946   Cancelled Treatment:    Reason Eval/Treat Not Completed: Patient at procedure or test/ unavailable. Pt off the floor for TEE procedure. OT will attempt to follow up when pt is available.   Darleen Crocker, MS, OTR/L , CBIS ascom [phone]  11/20/19, 10:00 AM  11/20/2019, 9:59 AM

## 2019-11-20 NOTE — Progress Notes (Signed)
TEE postponed secondary to worsening respiratory status with bilateral wheezing.  EKG performed which demonstrated sinus tachycardia with a bifascicular block, without significant change when compared to prior tracing.  Patient to receive previously scheduled duonebs and morphine.  Will place order for stat chest x-ray.  Patient to be transported back to the floor.  Will notify primary service of the above for further evaluation.

## 2019-11-21 ENCOUNTER — Encounter: Payer: Self-pay | Admitting: Physician Assistant

## 2019-11-21 ENCOUNTER — Encounter: Payer: Self-pay | Admitting: Internal Medicine

## 2019-11-21 DIAGNOSIS — G9600 Cerebrospinal fluid leak, unspecified: Secondary | ICD-10-CM

## 2019-11-21 DIAGNOSIS — R7881 Bacteremia: Secondary | ICD-10-CM

## 2019-11-21 LAB — CBC WITH DIFFERENTIAL/PLATELET
Abs Immature Granulocytes: 0.08 10*3/uL — ABNORMAL HIGH (ref 0.00–0.07)
Basophils Absolute: 0 10*3/uL (ref 0.0–0.1)
Basophils Relative: 0 %
Eosinophils Absolute: 0.5 10*3/uL (ref 0.0–0.5)
Eosinophils Relative: 4 %
HCT: 28.2 % — ABNORMAL LOW (ref 36.0–46.0)
Hemoglobin: 9.2 g/dL — ABNORMAL LOW (ref 12.0–15.0)
Immature Granulocytes: 1 %
Lymphocytes Relative: 11 %
Lymphs Abs: 1.4 10*3/uL (ref 0.7–4.0)
MCH: 30.5 pg (ref 26.0–34.0)
MCHC: 32.6 g/dL (ref 30.0–36.0)
MCV: 93.4 fL (ref 80.0–100.0)
Monocytes Absolute: 1.1 10*3/uL — ABNORMAL HIGH (ref 0.1–1.0)
Monocytes Relative: 9 %
Neutro Abs: 9.1 10*3/uL — ABNORMAL HIGH (ref 1.7–7.7)
Neutrophils Relative %: 75 %
Platelets: 167 10*3/uL (ref 150–400)
RBC: 3.02 MIL/uL — ABNORMAL LOW (ref 3.87–5.11)
RDW: 13.3 % (ref 11.5–15.5)
WBC: 12.2 10*3/uL — ABNORMAL HIGH (ref 4.0–10.5)
nRBC: 0 % (ref 0.0–0.2)

## 2019-11-21 LAB — GLUCOSE, CAPILLARY
Glucose-Capillary: 128 mg/dL — ABNORMAL HIGH (ref 70–99)
Glucose-Capillary: 306 mg/dL — ABNORMAL HIGH (ref 70–99)
Glucose-Capillary: 312 mg/dL — ABNORMAL HIGH (ref 70–99)
Glucose-Capillary: 299 mg/dL — ABNORMAL HIGH (ref 70–99)

## 2019-11-21 LAB — BASIC METABOLIC PANEL
Anion gap: 8 (ref 5–15)
BUN: 18 mg/dL (ref 8–23)
CO2: 27 mmol/L (ref 22–32)
Calcium: 8.6 mg/dL — ABNORMAL LOW (ref 8.9–10.3)
Chloride: 102 mmol/L (ref 98–111)
Creatinine, Ser: 0.94 mg/dL (ref 0.44–1.00)
GFR calc Af Amer: >60 mL/min (ref >60)
GFR calc non Af Amer: 60 mL/min — ABNORMAL LOW (ref >60)
Glucose, Bld: 109 mg/dL — ABNORMAL HIGH (ref 70–99)
Potassium: 3.9 mmol/L (ref 3.5–5.1)
Sodium: 137 mmol/L (ref 135–145)

## 2019-11-21 LAB — MAGNESIUM: Magnesium: 2 mg/dL (ref 1.7–2.4)

## 2019-11-21 MED ORDER — INSULIN GLARGINE 100 UNIT/ML ~~LOC~~ SOLN
65.0000 [IU] | Freq: Every day | SUBCUTANEOUS | Status: DC
  Administered 2019-11-21 – 2019-11-23 (×3): 65 [IU] via SUBCUTANEOUS
  Filled 2019-11-21 (×5): qty 0.65

## 2019-11-21 MED ORDER — SODIUM CHLORIDE 0.9 % IV SOLN
2.0000 g | Freq: Two times a day (BID) | INTRAVENOUS | Status: DC
  Administered 2019-11-21 – 2019-11-24 (×6): 2 g via INTRAVENOUS
  Filled 2019-11-21 (×5): qty 20
  Filled 2019-11-21 (×2): qty 2
  Filled 2019-11-21 (×2): qty 20

## 2019-11-21 MED ORDER — SODIUM CHLORIDE 0.9 % IV SOLN
2.0000 g | INTRAVENOUS | Status: DC
  Filled 2019-11-21: qty 20

## 2019-11-21 NOTE — Progress Notes (Signed)
Date of Admission:  11/15/2019    ID: Andrea [name] is a 74 y.o. female  Principal Problem:   Sepsis ([place]) Active Problems:   Chronic systolic heart failure (HCC)   Chronic obstructive pulmonary disease (HCC)   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 edema, with long-term current use of insulin (HCC)   Coronary artery disease of native artery of native heart with stable angina pectoris ([place])   Spinal stenosis of lumbar region    Subjective: No fever No headache Has csf rhinorrhea  Medications:  . aspirin EC  81 mg Oral Daily  . atorvastatin  80 mg Oral QHS  . enoxaparin (LOVENOX) injection  40 mg Subcutaneous Q24H  . feeding supplement (GLUCERNA SHAKE)  237 mL Oral Q24H  . insulin aspart  0-20 Units Subcutaneous TID AC & HS  . insulin glargine  65 Units Subcutaneous Q supper  . lidocaine-prilocaine   Topical Once  . loratadine  10 mg Oral Daily  . losartan  25 mg Oral BH-q7a  . metoprolol succinate  25 mg Oral BH-q7a  . oxybutynin  10 mg Oral BH-q7a  . pantoprazole  40 mg Oral Daily  . sertraline  25 mg Oral Daily  . vitamin B-12  1,000 mcg Oral Daily    Objective: Vital signs in last 24 hours: Temp:  [98.1 F (36.7 C)-98.4 F (36.9 C)] 98.1 F (36.7 C) (09/15 0743) Pulse Rate:  [89-101] 101 (09/15 0743) Resp:  [17-20] 17 (09/15 0743) BP: (116-148)/(47-63) 116/63 (09/15 0743) SpO2:  [91 %-98 %] 91 % (09/15 0743)  PHYSICAL EXAM:  General: Alert, cooperative, no distress, appears stated age.  Head: Normocephalic, without obvious abnormality, atraumatic. Eyes: Conjunctivae clear, anicteric sclerae. Pupils are equal ENT Nares normal. No drainage or sinus tenderness. Back: No CVA tenderness. Lungs: b/l air entry Heart: Regular rate and rhythm, no murmur, rub or gallop. Abdomen: Soft, non-tender,not distended. Bowel sounds normal. No masses Extremities: atraumatic, no cyanosis. No edema. No  clubbing Skin: No rashes or lesions. Or bruising Lymph: Cervical, supraclavicular normal. Neurologic: Grossly non-focal  Lab Results Recent Labs    11/19/19 0346 11/19/19 0346 11/20/19 0315 11/21/19 0455  WBC 12.3*  --   --  12.2*  HGB 10.2*  --   --  9.2*  HCT 29.5*  --   --  28.2*  NA 137   < > 137 137  K 3.8   < > 4.2 3.9  CL 105   < > 105 102  CO2 23   < > 24 27  BUN 24*   < > 22 18  CREATININE 1.06*   < > 1.05* 0.94   < > = values in this interval not displayed.   Liver Panel No results for input(s): PROT, ALBUMIN, AST, ALT, ALKPHOS, BILITOT, BILIDIR, IBILI in the last 72 hours. Sedimentation Rate No results for input(s): ESRSEDRATE in the last 72 hours. C-Reactive Protein No results for input(s): CRP in the last 72 hours.  Microbiology:  Studies/Results: CT MAXILLOFACIAL W & WO CONTRAST  Result Date: 11/21/2019 CLINICAL DATA:  Rule out CSF leak. Recent history of headache and positive blood cultures for strep pneumonia. Presumed meningitis. Nasal drainage fluid. Beta transferrin pending. EXAM: CT MAXILLOFACIAL WITHOUT AND WITH CONTRAST TECHNIQUE: Multidetector CT imaging of the maxillofacial structures was performed without and after intrathecal contrast. Multiplanar CT image reconstructions were also generated. CONTRAST:  13mL OMNIPAQUE IOHEXOL 300 MG/ML SOLN Intrathecal contrast injected in  the lumbar spine by Dr. Vernard Gambles. COMPARISON:  CT head 11/15/2019 FINDINGS: Precontrast CT of the sinuses demonstrates an air-fluid level in the left sphenoid sinus. This fluid density is approximately 50 Hounsfield units. No other air-fluid levels. No bony defect is seen in the skull base. No intracranial mass or meningocele identified. Remaining paranasal sinuses are clear. Bilateral cataract extraction.  No orbital mass. Following intrathecal infusion of contrast, repeat CT was performed of the sinuses. Delayed imaging also performed 4 hours later. The patient initially refused delayed  scanning however after further discussion with the patient, this was performed. The air-fluid level in the left sphenoid sinus demonstrates slight increased density compared to the pre contrast fluid measuring approximately 65-75 Hounsfield units. This was considered indeterminate and therefore delayed imaging was requested and eventually performed. The 4 hour delayed imaging shows definitive contrast in the left sphenoid sinus with high density material measuring approximately 375 Hounsfield units. No other area of contrast leak is identified in the sinuses. No bony defect identified as a cause of the leak. IMPRESSION: 1. Air-fluid level left sphenoid sinus. On delayed imaging, there is definitive contrast in the left sphenoid sinus compatible with CSF leak. No cause for the leak identified. No fracture, mass, or bone defect identified. No meningocele identified. Remaining sinuses clear. 2. These results were called by telephone at the time of interpretation on 11/21/2019 at 8:19 am to provider DAVID Sabino Gasser , who verbally acknowledged these results. Electronically Signed   By: Franchot Gallo M.D.   On: 11/21/2019 08:19   DG Chest Port 1 View  Result Date: 11/20/2019 CLINICAL DATA:  Shortness of breath EXAM: PORTABLE CHEST 1 VIEW COMPARISON:  November 15, 2019 FINDINGS: Ill-defined airspace opacity is noted in each upper lobe. Lungs elsewhere clear. Heart size and pulmonary vascularity are normal. No adenopathy. There is aortic atherosclerosis. Bones are osteoporotic. IMPRESSION: Ill-defined airspace opacity in each upper lobe, concerning for foci of pneumonia. Lungs elsewhere clear. Stable cardiac silhouette. Aortic Atherosclerosis (ICD10-I70.0). Electronically Signed   By: Lowella Grip III M.D.   On: 11/20/2019 13:38   DG FLUORO GUIDED NEEDLE PLC ASPIRATION/INJECTION LOC  Result Date: 11/20/2019 CLINICAL DATA:  Rhinorrhea, meningitis, evaluate for spontaneous CSF leak. Contrast injection requested for  CT cisternogram. EXAM: INTRATHECAL INJECTION UNDER FLUOROSCOPY FLUOROSCOPY TIME:  24 seconds TECHNIQUE: The procedure, risks (including but not limited to bleeding, infection, organ damage ), benefits, and alternatives were explained to the patient. Questions regarding the procedure were encouraged and answered. The patient understands and consents to the procedure. An appropriate skin entry site was determined fluoroscopically. Operator donned sterile gloves and mask. Skin site was marked, then prepped with Betadine, draped in usual sterile fashion, and infiltrated locally with 1% lidocaine. A 20 gauge spinal needle advanced into the thecal sac at L2-3 from a left interlaminar approach. 10 mL Omnipaque 300 was then administered intrathecally. The needle was then removed. Using protocol table tilt maneuver, the contrast was directed intracranially with fluoroscopic confirmation. The patient tolerated the procedure well. COMPLICATIONS: None immediate IMPRESSION: 1. Technically successful intrathecal contrast injection under fluoroscopy. Electronically Signed   By: Lucrezia Europe M.D.   On: 11/20/2019 14:49     Assessment/Plan: 74 year old female with history of diabetes mellitus presents with headache and confusion Encephalopathy with pneumococcal bacteremia raises suspicion for pneumococcal meningitis. But patient is completely awake alert and oriented x5 today with some headache. She may have had a mild form of meningitis. She received 1 dose of Decadron. No further doses recommended.  Highly susceptible pneumococcus- will change ceftriaxone to  Penicillin She has csf rhinorrhea/ . 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 No fracture, mass, or bone defect identified. No meningocele identified. Remaining sinuses clear.  Change penicillin to ceftriaxone to cover for H.influenza as well as she has csf leak into sphenoid   Repeat blood cultures neg  2D echo  was a limited study  . TEE not needed as endocarditis is less likely especially we know that she has a csf leak If she has not received her pneumococcal vaccination will get it at after completion of treatment.  Diabetes mellitus management as per primary team  Discussed the management with patient and her daughter and care team- transfer to a center with skull based ENT surgeon services  For correction of csf leak

## 2019-11-21 NOTE — Progress Notes (Signed)
Writer received new orders per pharmacy to stop IVPenicillin abx @ 10pm and Pt is to start rocephin @ 10pm.

## 2019-11-21 NOTE — Progress Notes (Signed)
Physical Therapy Treatment Patient Details Name: Andrea [name] MRN: [id number] DOB: Dec 02, 1945 Today's Date: 11/21/2019    History of Present Illness Andrea [name] is a 74 y/o female who was admitted for AMS and headache. PMH includes CAD, DM II, GERD, HLD, recurrent UTI, PNA, depression, CVA, and TIA.    PT Comments    Up to chair at entry, DTR at bed side. DTR/pt inform Pryor Curia of recent diagnosed CSF leak (nasal) with plans to see ENT, possible surgery. Pt agreeable to treatment session. Performs 2 sets of 5, STS transfer from chair, requires minA for latter half of sets due to fatigue. Pt able to perform slow stepping/gait training fwd then back with RW, a total of 6 ft, limited somewhat by pain, but mostly by fatigue and falls anxiety. Pt continues to progress toward goals. Will continue to follow.     Follow Up Recommendations  Home health PT;Supervision for mobility/OOB     Equipment Recommendations  None recommended by PT;Other (comment)    Recommendations for Other Services       Precautions / Restrictions Precautions Precautions: Fall Restrictions Weight Bearing Restrictions: No    Mobility  Bed Mobility               General bed mobility comments: seated in recliner chair upon entering the room  Transfers Overall transfer level: Needs assistance Equipment used: None (YRW) Transfers: Sit to/from Stand Sit to Stand: Min guard;Min assist         General transfer comment: 2x5; requires minA for reps 4, 5 each set  Ambulation/Gait Ambulation/Gait assistance: Min guard Gait Distance (Feet): 6 Feet Assistive device: None (YRW) Gait Pattern/deviations: Step-to pattern     General Gait Details: 47ft fwd, 48ft back; minGuardA; some falls anxiety   Stairs             Wheelchair Mobility    Modified Rankin (Stroke Patients Only)       Balance Overall balance assessment: Modified Independent Sitting-balance support: No upper extremity  supported;Feet supported Sitting balance-Leahy Scale: Good     Standing balance support: Single extremity supported;During functional activity Standing balance-Leahy Scale: Fair                              Cognition Arousal/Alertness: Awake/alert Behavior During Therapy: WFL for tasks assessed/performed Overall Cognitive Status: Within Functional Limits for tasks assessed                                        Exercises      General Comments        Pertinent Vitals/Pain Pain Assessment: Faces Faces Pain Scale: Hurts a little bit Pain Location: Headache Pain Intervention(s): Limited activity within patient's tolerance;RN gave pain meds during session;Monitored during session    Home Living                      Prior Function            PT Goals (current goals can now be found in the care plan section) Acute Rehab PT Goals Patient Stated Goal: To return home PT Goal Formulation: With patient Time For Goal Achievement: 12/01/19 Potential to Achieve Goals: Good Additional Goals Additional Goal #1: Pt will perform sit <> stand and stand pivot sit transfers with mod I and LRAD. Progress towards PT  goals: Progressing toward goals    Frequency    Min 2X/week      PT Plan Current plan remains appropriate    Co-evaluation              AM-PAC PT "6 Clicks" Mobility   Outcome Measure  Help needed turning from your back to your side while in a flat bed without using bedrails?: A Little Help needed moving from lying on your back to sitting on the side of a flat bed without using bedrails?: A Lot Help needed moving to and from a bed to a chair (including a wheelchair)?: A Little Help needed standing up from a chair using your arms (e.g., wheelchair or bedside chair)?: A Little Help needed to walk in hospital room?: A Little Help needed climbing 3-5 steps with a railing? : A Lot 6 Click Score: 16    End of Session  Equipment Utilized During Treatment: Gait belt;Oxygen Activity Tolerance: Patient tolerated treatment well;No increased pain Patient left: in chair;with call bell/phone within reach;with chair alarm set;with family/visitor present Nurse Communication: Mobility status;Other (comment) PT Visit Diagnosis: Unsteadiness on feet (R26.81);Other abnormalities of gait and mobility (R26.89);Muscle weakness (generalized) (M62.81)     Time: 8828-0034 PT Time Calculation (min) (ACUTE ONLY): 30 min  Charges:  $Therapeutic Exercise: 23-37 mins                     4:47 PM, 11/21/19 Etta Grandchild, PT, DPT Physical Therapist - [facility]  [phone] ([place])    Clydine Parkison C 11/21/2019, 4:45 PM

## 2019-11-21 NOTE — Progress Notes (Signed)
Occupational Therapy Treatment Patient Details Name: Andrea [name] MRN: [id number] DOB: May 01, 1945 Today's Date: 11/21/2019    History of present illness Andrea [name] is a 74 y/o female who was admitted for AMS and headache. PMH includes CAD, DM II, GERD, HLD, recurrent UTI, PNA, depression, CVA, and TIA.   OT comments  Upon entering the room, pt seated in recliner chair and finishing breakfast. Pt is agreeable to OT intervention this session. While pt finishes eating, OT educated pt on energy conservation/pain management strategies for home. Pt reports doing some very unsafe things such as bring pot of hot food from stove towards lap to stir vs. Standing at stove to stir. OT expressed large safety concern with these practices and how to address. Also the importance of HHOT to help provide education for safety with these tasks at home. Pt standing x 2 with min guard and then performing 10 reps of chair push ups. Pt does fatigue quickly and has constant nasal drainage during session. Pt continues to benefit from OT intervention.    Follow Up Recommendations  Home health OT;Supervision - Intermittent    Equipment Recommendations  3 in 1 bedside commode       Precautions / Restrictions Precautions Precautions: Fall       Mobility Bed Mobility    General bed mobility comments: seated in recliner chair upon entering the room  Transfers Overall transfer level: Needs assistance Equipment used: Rolling walker (2 wheeled) Transfers: Sit to/from Stand Sit to Stand: Min guard         Balance Overall balance assessment: Needs assistance Sitting-balance support: Feet supported;Single extremity supported Sitting balance-Leahy Scale: Good Sitting balance - Comments: one slight anterior LOB however pt able to self correct; SBA for safety   Standing balance support: Single extremity supported;During functional activity Standing balance-Leahy Scale: Fair Standing balance comment: CGA  for static standing with BUE on RW then pt able to remove one hand to point to something with no overt LOB          ADL either performed or assessed with clinical judgement                  Cognition Arousal/Alertness: Awake/alert Behavior During Therapy: WFL for tasks assessed/performed Overall Cognitive Status: Within Functional Limits for tasks assessed                      Pertinent Vitals/ Pain       Pain Assessment: 0-10 Pain Score: 5  Pain Location: Headache Pain Descriptors / Indicators: Discomfort;Grimacing;Dull;Headache Pain Intervention(s): Monitored during session;Premedicated before session;Limited activity within patient's tolerance         Frequency  Min 1X/week        Progress Toward Goals  OT Goals(current goals can now be found in the care plan section)  Progress towards OT goals: Progressing toward goals  Acute Rehab OT Goals Patient Stated Goal: To return home OT Goal Formulation: With patient/family Time For Goal Achievement: 12/02/19 Potential to Achieve Goals: Good  Plan Discharge plan remains appropriate;Frequency needs to be updated       AM-PAC OT "6 Clicks" Daily Activity     Outcome Measure   Help from another person eating meals?: None Help from another person taking care of personal grooming?: A Little Help from another person toileting, which includes using toliet, bedpan, or urinal?: A Little Help from another person bathing (including washing, rinsing, drying)?: A Little Help from another person to put on and  taking off regular upper body clothing?: None Help from another person to put on and taking off regular lower body clothing?: A Little 6 Click Score: 20    End of Session    OT Visit Diagnosis: Other abnormalities of gait and mobility (R26.89)   Activity Tolerance Patient tolerated treatment well   Patient Left with call bell/phone within reach;in chair;with chair alarm set   Nurse Communication Mobility  status        Time: 8159-4707 OT Time Calculation (min): 42 min  Charges: OT General Charges $OT Visit: 1 Visit OT Treatments $Therapeutic Activity: 23-37 mins $Therapeutic Exercise: 8-22 mins  Darleen Crocker, MS, OTR/L , CBIS ascom [phone]  11/21/19, 12:47 PM

## 2019-11-21 NOTE — Progress Notes (Signed)
PROGRESS NOTE    Andrea [name]   TXM:[id number]  DOB: 02-11-1946  DOA: 11/15/2019     6  PCP: Rubye [facility]  CC: fever, AMS  Hospital Course: Ms. Andrea [name] is a 74 year old female with CAD, DM 2 and recurrent UTI and pneumonia who was admitted 11/15/2019 with fever, leukocytosis and altered mental status.  Patient was started on broad-spectrum antibiotics to cover treatment of meningitis although patient refused lumbar puncture.  She was treated with cefepime, vancomycin, ampicillin, metronidazole and acyclovir.  Blood cultures became positive for strep pneumonia with no clear source (initially) as chest x-ray was negative initially.   Antibiotics were deescalated to vancomycin and ceftriaxone.  Patient improved clinically with resumption of normal mental status however did have lingering headache (6/10).  She was seen by ID who recommended continue ceftriaxone for 14 days until MIC of pneumococcus has returned. Once sensitivies returned she was de-escalated further to PCN.  Vancomycin was also discontinued.  On 11/19/19 she developed a clear nasal discharge from her left nare that appeared more CSF in consistency than mucous. A beta-2 transferrin was sent for testing.   On 11/20/19 she was to undergo TEE however developed worsening cough/SOB. She then underwent a CXR which showed bilateral upper airspace opacities concerning for PNA.   She also underwent a CT cisternogram and after serial imagining she was considered to have a CSF leak per radiology read (beta-2 transferrin still pending).  Her case was discussed with [facility] (Dr. Dub Mikes) for transfer. She was accepted for transfer pending bed availability and placed on transfer wait list.  In the meantime she was continued on PCN for treatment of her underlying infections (Strep pna bacteremia, presumed meningitis, and PNA).    Interval History:  Patient's other daughter is bedside this morning.  We also called her daughter  [name] on speaker phone and went over a general update.  Tentative plan at this time is to seek transfer for ENT to further evaluate her presumed CSF leak after imaging studies were completed yesterday.  Patient and family are aware and in agreement of the plan. She continues to have a headache, 6/10 in intensity but is not worsening. No fevers overnight nor any neck pain.  Old records reviewed in assessment of this patient  ROS: Constitutional: negative for chills and fevers, Respiratory: positive for cough, Cardiovascular: negative for chest pain and Gastrointestinal: negative for abdominal pain  Assessment & Plan: Sepsis -resolved Pneumococcal bacteremia Acute metabolic encephalopathy - resolved Acute hypoxic respiratory failure Patient is much improved with aggressive treatment, now consolidated from ceftriaxone to PCN for treatment of pneumococcal bacteremia. - continue PCN - given PNA being noted on CXR and with culture data, it is now presumed that her bacteremia and presumed meningitis is all attributed to the Strep pna; therefore TEE can be cancelled (as per ID also) - weaning O2 as able, currently 2L  CSF leak, high suspicion - CT cisternogram shows what appears to be CSF leak into left sphenoid sinus (no obvious fx and patient denies trauma, etc) - follow up beta 2 transferrin - continue PCN - have spoke with [facility] (Dr. Dub Mikes). Currently patient on wait list for transfer to hospitalist team with ENT available for consult  Headache This may be secondary to resolving meningitis - continue supportive care - if worsens will consider repeat imagining but just had CT cisternogram on 9/14 as well  DM2 - continue Lantus; adjust as needed - continue SSI and CBGs  Chronic  LBP/hip pain with spinal stenosis Continue Norco  COPD No evidence for acute flare Continue O2 at home doses As needed inhaled bronchodilators  CAD No chest pain, continue metoprolol, losartan,  aspirin and statin  Antimicrobials: Ampicillin 9/9>>9/10 Cefepime 9/10 x 1 Rocephin 9/9>>9/13 Flagyl 9/10 x 2 Vanc 9/9 >> 9/12 PCN G 9/13 >> current   DVT prophylaxis: Lovenox Code Status: Full Family Communication: Daughter bedside Disposition Plan: Status is: Inpatient  Remains inpatient appropriate because:Ongoing diagnostic testing needed not appropriate for outpatient work up, Unsafe d/c plan, IV treatments appropriate due to intensity of illness or inability to take PO and Inpatient level of care appropriate due to severity of illness   Dispo: The patient is from: Home              Anticipated d/c is to: transfer to [facility] when bed available              Anticipated d/c date is: 3 days              Patient currently is not medically stable to d/c.  Objective: Blood pressure 116/63, pulse (!) 101, temperature 98.1 F (36.7 C), resp. rate 17, height 5' (1.524 m), weight 72.8 kg, SpO2 91 %.  Examination: General appearance: alert, cooperative and no distress Head: Normocephalic, without obvious abnormality, atraumatic Eyes: EOMI Lungs: coarse breath sounds bilaterally Heart: regular rate and rhythm and S1, S2 normal Abdomen: normal findings: bowel sounds normal and soft, non-tender Extremities: no edema Skin: mobility and turgor normal Neurologic: Grossly normal  Consultants:   ID  Neurosurgery  Procedures:   n/a  Data Reviewed: I have personally reviewed following labs and imaging studies Results for orders placed or performed during the hospital encounter of 11/15/19 (from the past 24 hour(s))  Glucose, capillary     Status: Abnormal   Collection Time: 11/20/19  9:17 PM  Result Value Ref Range   Glucose-Capillary 203 (H) 70 - 99 mg/dL  Basic metabolic panel     Status: Abnormal   Collection Time: 11/21/19  4:55 AM  Result Value Ref Range   Sodium 137 135 - 145 mmol/L   Potassium 3.9 3.5 - 5.1 mmol/L   Chloride 102 98 - 111 mmol/L   CO2 27 22 - 32 mmol/L    Glucose, Bld 109 (H) 70 - 99 mg/dL   BUN 18 8 - 23 mg/dL   Creatinine, Ser 0.94 0.44 - 1.00 mg/dL   Calcium 8.6 (L) 8.9 - 10.3 mg/dL   GFR calc non Af Amer 60 (L) >60 mL/min   GFR calc Af Amer >60 >60 mL/min   Anion gap 8 5 - 15  CBC with Differential/Platelet     Status: Abnormal   Collection Time: 11/21/19  4:55 AM  Result Value Ref Range   WBC 12.2 (H) 4.0 - 10.5 K/uL   RBC 3.02 (L) 3.87 - 5.11 MIL/uL   Hemoglobin 9.2 (L) 12.0 - 15.0 g/dL   HCT 28.2 (L) 36 - 46 %   MCV 93.4 80.0 - 100.0 fL   MCH 30.5 26.0 - 34.0 pg   MCHC 32.6 30.0 - 36.0 g/dL   RDW 13.3 11.5 - 15.5 %   Platelets 167 150 - 400 K/uL   nRBC 0.0 0.0 - 0.2 %   Neutrophils Relative % 75 %   Neutro Abs 9.1 (H) 1.7 - 7.7 K/uL   Lymphocytes Relative 11 %   Lymphs Abs 1.4 0.7 - 4.0 K/uL   Monocytes Relative 9 %  Monocytes Absolute 1.1 (H) 0 - 1 K/uL   Eosinophils Relative 4 %   Eosinophils Absolute 0.5 0 - 0 K/uL   Basophils Relative 0 %   Basophils Absolute 0.0 0 - 0 K/uL   Immature Granulocytes 1 %   Abs Immature Granulocytes 0.08 (H) 0.00 - 0.07 K/uL  Magnesium     Status: None   Collection Time: 11/21/19  4:55 AM  Result Value Ref Range   Magnesium 2.0 1.7 - 2.4 mg/dL  Glucose, capillary     Status: Abnormal   Collection Time: 11/21/19  8:14 AM  Result Value Ref Range   Glucose-Capillary 128 (H) 70 - 99 mg/dL  Glucose, capillary     Status: Abnormal   Collection Time: 11/21/19 12:10 PM  Result Value Ref Range   Glucose-Capillary 306 (H) 70 - 99 mg/dL    Recent Results (from the past 240 hour(s))  Urine culture     Status: Abnormal   Collection Time: 11/15/19 11:03 AM   Specimen: In/Out Cath Urine  Result Value Ref Range Status   Specimen Description   Final    IN/OUT CATH URINE Performed at [facility], [address], [place], [place] [address]    Special Requests   Final    NONE Performed at [facility], [address] [place], [place] [address]    Culture >=100,000  COLONIES/mL ESCHERICHIA COLI (A)  Final   Report Status 11/18/2019 FINAL  Final   Organism ID, Bacteria ESCHERICHIA COLI (A)  Final      Susceptibility   Escherichia coli - MIC*    AMPICILLIN >=32 RESISTANT Resistant     CEFAZOLIN <=4 SENSITIVE Sensitive     CEFTRIAXONE <=0.25 SENSITIVE Sensitive     CIPROFLOXACIN <=0.25 SENSITIVE Sensitive     GENTAMICIN <=1 SENSITIVE Sensitive     IMIPENEM <=0.25 SENSITIVE Sensitive     NITROFURANTOIN <=16 SENSITIVE Sensitive     TRIMETH/SULFA <=20 SENSITIVE Sensitive     AMPICILLIN/SULBACTAM 8 SENSITIVE Sensitive     PIP/TAZO <=4 SENSITIVE Sensitive     * >=100,000 COLONIES/mL ESCHERICHIA COLI  Blood culture (routine x 2)     Status: Abnormal   Collection Time: 11/15/19  6:38 PM   Specimen: BLOOD  Result Value Ref Range Status   Specimen Description   Final    BLOOD LEFT ANTECUBITAL Performed at [facility], [address] [place], [place] [address]    Special Requests   Final    BOTTLES DRAWN AEROBIC AND ANAEROBIC Blood Culture results may not be optimal due to an excessive volume of blood received in culture bottles Performed at [facility], [address] [place], [place] [address]    Culture  Setup Time   Final    Organism ID to follow [place] CRITICAL RESULT CALLED TO, READ BACK BY AND VERIFIED WITH: SUSAN WATSON AT 0820 ON 11/16/19 SNG Performed at [facility], [address] [place], [place] [address]    Culture STREPTOCOCCUS PNEUMONIAE (A)  Final   Report Status 11/18/2019 FINAL  Final   Organism ID, Bacteria STREPTOCOCCUS PNEUMONIAE  Final      Susceptibility   Streptococcus pneumoniae - MIC*    ERYTHROMYCIN <=0.12 SENSITIVE Sensitive     LEVOFLOXACIN 0.5 SENSITIVE Sensitive     VANCOMYCIN 0.5 SENSITIVE Sensitive     PENICILLIN (meningitis) <=0.06 SENSITIVE Sensitive     PENO - penicillin <=0.06  PENICILLIN (non-meningitis) <=0.06 SENSITIVE  Sensitive     PENICILLIN (oral) <=0.06 SENSITIVE Sensitive     CEFTRIAXONE (non-meningitis) <=0.12 SENSITIVE Sensitive     CEFTRIAXONE (meningitis) <=0.12 SENSITIVE Sensitive     * STREPTOCOCCUS PNEUMONIAE  SARS Coronavirus 2 by RT PCR (hospital order, performed in [place] hospital lab) Nasopharyngeal Nasopharyngeal Swab     Status: None   Collection Time: 11/15/19  6:38 PM   Specimen: Nasopharyngeal Swab  Result Value Ref Range Status   SARS Coronavirus 2 NEGATIVE NEGATIVE Final    Comment: (NOTE) SARS-CoV-2 target nucleic acids are NOT DETECTED.  The SARS-CoV-2 RNA is generally detectable in upper and lower respiratory specimens during the acute phase of infection. The lowest concentration of SARS-CoV-2 viral copies this assay can detect is 250 copies / mL. A negative result does not preclude SARS-CoV-2 infection and should not be used as the sole basis for treatment or other patient management decisions.  A negative result may occur with improper specimen collection / handling, submission of specimen other than nasopharyngeal swab, presence of viral mutation(s) within the areas targeted by this assay, and inadequate number of viral copies (<250 copies / mL). A negative result must be combined with clinical observations, patient history, and epidemiological information.  Fact Sheet for Patients:   StrictlyIdeas.no  Fact Sheet for Healthcare Providers: BankingDealers.co.za  This test is not yet approved or  cleared by the [place] FDA and has been authorized for detection and/or diagnosis of SARS-CoV-2 by FDA under an Emergency Use Authorization (EUA).  This EUA will remain in effect (meaning this test can be used) for the duration of the COVID-19 declaration under Section 564(b)(1) of the Act, 21 U.S.C. section 360bbb-3(b)(1), unless the authorization is terminated or revoked sooner.  Performed at [facility],  [address] [place], [place] [id number]   Blood Culture ID Panel (Reflexed)     Status: Abnormal   Collection Time: 11/15/19  6:38 PM  Result Value Ref Range Status   Enterococcus faecalis NOT DETECTED NOT DETECTED Final   Enterococcus Faecium NOT DETECTED NOT DETECTED Final   Listeria monocytogenes NOT DETECTED NOT DETECTED Final   Staphylococcus species NOT DETECTED NOT DETECTED Final   Staphylococcus aureus (BCID) NOT DETECTED NOT DETECTED Final   Staphylococcus epidermidis NOT DETECTED NOT DETECTED Final   Staphylococcus lugdunensis NOT DETECTED NOT DETECTED Final   Streptococcus species DETECTED (A) NOT DETECTED Final    Comment: CRITICAL RESULT CALLED TO, READ BACK BY AND VERIFIED WITH: SUSAN WATSON AT 0820 ON 11/16/19 SNG    Streptococcus agalactiae NOT DETECTED NOT DETECTED Final   Streptococcus pneumoniae DETECTED (A) NOT DETECTED Final    Comment: CRITICAL RESULT CALLED TO, READ BACK BY AND VERIFIED WITH: SUSAN WATSON AT 0820 ON 11/16/19 SNG    Streptococcus pyogenes NOT DETECTED NOT DETECTED Final   A.calcoaceticus-baumannii NOT DETECTED NOT DETECTED Final   Bacteroides fragilis NOT DETECTED NOT DETECTED Final   Enterobacterales NOT DETECTED NOT DETECTED Final   Enterobacter cloacae complex NOT DETECTED NOT DETECTED Final   Escherichia coli NOT DETECTED NOT DETECTED Final   Klebsiella aerogenes NOT DETECTED NOT DETECTED Final   Klebsiella oxytoca NOT DETECTED NOT DETECTED Final   Klebsiella pneumoniae NOT DETECTED NOT DETECTED Final   Proteus species NOT DETECTED NOT DETECTED Final   Salmonella species NOT DETECTED NOT DETECTED Final   Serratia marcescens NOT DETECTED NOT DETECTED Final   Haemophilus influenzae NOT DETECTED NOT DETECTED Final   Neisseria meningitidis NOT DETECTED  NOT DETECTED Final   Pseudomonas aeruginosa NOT DETECTED NOT DETECTED Final   Stenotrophomonas maltophilia NOT DETECTED NOT DETECTED Final   Candida albicans NOT DETECTED NOT DETECTED Final    Candida auris NOT DETECTED NOT DETECTED Final   Candida glabrata NOT DETECTED NOT DETECTED Final   Candida krusei NOT DETECTED NOT DETECTED Final   Candida parapsilosis NOT DETECTED NOT DETECTED Final   Candida tropicalis NOT DETECTED NOT DETECTED Final   Cryptococcus neoformans/gattii NOT DETECTED NOT DETECTED Final    Comment: Performed at [facility], [address] [place], [place] [address]  Culture, blood (Routine X 2) w Reflex to ID Panel     Status: None (Preliminary result)   Collection Time: 11/16/19 11:53 PM   Specimen: BLOOD  Result Value Ref Range Status   Specimen Description BLOOD BLOOD LEFT FOREARM  Final   Special Requests   Final    BOTTLES DRAWN AEROBIC AND ANAEROBIC Blood Culture adequate volume   Culture   Final    NO GROWTH 4 DAYS Performed at [facility], [address] [place], [place] [address]    Report Status PENDING  Incomplete  Culture, blood (Routine X 2) w Reflex to ID Panel     Status: None (Preliminary result)   Collection Time: 11/16/19 11:53 PM   Specimen: BLOOD  Result Value Ref Range Status   Specimen Description BLOOD BLOOD LEFT HAND  Final   Special Requests   Final    BOTTLES DRAWN AEROBIC AND ANAEROBIC Blood Culture adequate volume   Culture   Final    NO GROWTH 4 DAYS Performed at [facility], [address], [place], [place] [address]    Report Status PENDING  Incomplete     Radiology Studies: CT MAXILLOFACIAL W & WO CONTRAST  Result Date: 11/21/2019 CLINICAL DATA:  Rule out CSF leak. Recent history of headache and positive blood cultures for strep pneumonia. Presumed meningitis. Nasal drainage fluid. Beta transferrin pending. EXAM: CT MAXILLOFACIAL WITHOUT AND WITH CONTRAST TECHNIQUE: Multidetector CT imaging of the maxillofacial structures was performed without and after intrathecal contrast. Multiplanar CT image reconstructions were also generated. CONTRAST:  44mL OMNIPAQUE IOHEXOL 300 MG/ML SOLN  Intrathecal contrast injected in the lumbar spine by Dr. Vernard Gambles. COMPARISON:  CT head 11/15/2019 FINDINGS: Precontrast CT of the sinuses demonstrates an air-fluid level in the left sphenoid sinus. This fluid density is approximately 50 Hounsfield units. No other air-fluid levels. No bony defect is seen in the skull base. No intracranial mass or meningocele identified. Remaining paranasal sinuses are clear. Bilateral cataract extraction.  No orbital mass. Following intrathecal infusion of contrast, repeat CT was performed of the sinuses. Delayed imaging also performed 4 hours later. The patient initially refused delayed scanning however after further discussion with the patient, this was performed. The air-fluid level in the left sphenoid sinus demonstrates slight increased density compared to the pre contrast fluid measuring approximately 65-75 Hounsfield units. This was considered indeterminate and therefore delayed imaging was requested and eventually performed. The 4 hour delayed imaging shows definitive contrast in the left sphenoid sinus with high density material measuring approximately 375 Hounsfield units. No other area of contrast leak is identified in the sinuses. No bony defect identified as a cause of the leak. IMPRESSION: 1. Air-fluid level left sphenoid sinus. On delayed imaging, there is definitive contrast in the left sphenoid sinus compatible with CSF leak. No cause for the leak identified. No fracture, mass, or bone defect identified. No meningocele identified. Remaining  sinuses clear. 2. These results were called by telephone at the time of interpretation on 11/21/2019 at 8:19 am to provider Declan Adamson Sabino Gasser , who verbally acknowledged these results. Electronically Signed   By: Franchot Gallo M.D.   On: 11/21/2019 08:19   DG Chest Port 1 View  Result Date: 11/20/2019 CLINICAL DATA:  Shortness of breath EXAM: PORTABLE CHEST 1 VIEW COMPARISON:  November 15, 2019 FINDINGS: Ill-defined airspace  opacity is noted in each upper lobe. Lungs elsewhere clear. Heart size and pulmonary vascularity are normal. No adenopathy. There is aortic atherosclerosis. Bones are osteoporotic. IMPRESSION: Ill-defined airspace opacity in each upper lobe, concerning for foci of pneumonia. Lungs elsewhere clear. Stable cardiac silhouette. Aortic Atherosclerosis (ICD10-I70.0). Electronically Signed   By: Lowella Grip III M.D.   On: 11/20/2019 13:38   DG FLUORO GUIDED NEEDLE PLC ASPIRATION/INJECTION LOC  Result Date: 11/20/2019 CLINICAL DATA:  Rhinorrhea, meningitis, evaluate for spontaneous CSF leak. Contrast injection requested for CT cisternogram. EXAM: INTRATHECAL INJECTION UNDER FLUOROSCOPY FLUOROSCOPY TIME:  24 seconds TECHNIQUE: The procedure, risks (including but not limited to bleeding, infection, organ damage ), benefits, and alternatives were explained to the patient. Questions regarding the procedure were encouraged and answered. The patient understands and consents to the procedure. An appropriate skin entry site was determined fluoroscopically. Operator donned sterile gloves and mask. Skin site was marked, then prepped with Betadine, draped in usual sterile fashion, and infiltrated locally with 1% lidocaine. A 20 gauge spinal needle advanced into the thecal sac at L2-3 from a left interlaminar approach. 10 mL Omnipaque 300 was then administered intrathecally. The needle was then removed. Using protocol table tilt maneuver, the contrast was directed intracranially with fluoroscopic confirmation. The patient tolerated the procedure well. COMPLICATIONS: None immediate IMPRESSION: 1. Technically successful intrathecal contrast injection under fluoroscopy. Electronically Signed   By: Lucrezia Europe M.D.   On: 11/20/2019 14:49   CT MAXILLOFACIAL W & WO CONTRAST  Final Result    DG FLUORO GUIDED NEEDLE PLC ASPIRATION/INJECTION LOC  Final Result    DG Chest Port 1 View  Final Result    DG Chest Port 1 View   Final Result    CT Head Wo Contrast  Final Result      Scheduled Meds: . aspirin EC  81 mg Oral Daily  . atorvastatin  80 mg Oral QHS  . enoxaparin (LOVENOX) injection  40 mg Subcutaneous Q24H  . feeding supplement (GLUCERNA SHAKE)  237 mL Oral Q24H  . insulin aspart  0-20 Units Subcutaneous TID AC & HS  . insulin glargine  65 Units Subcutaneous Q supper  . lidocaine-prilocaine   Topical Once  . loratadine  10 mg Oral Daily  . losartan  25 mg Oral BH-q7a  . metoprolol succinate  25 mg Oral BH-q7a  . oxybutynin  10 mg Oral BH-q7a  . pantoprazole  40 mg Oral Daily  . sertraline  25 mg Oral Daily  . vitamin B-12  1,000 mcg Oral Daily   PRN Meds: acetaminophen **OR** acetaminophen, aspirin-acetaminophen-caffeine, HYDROcodone-acetaminophen, ipratropium-albuterol, morphine injection, ondansetron **OR** ondansetron (ZOFRAN) IV Continuous Infusions: . penicillin g continuous IV infusion 41.7 mL/hr at 11/21/19 1425      LOS: 6 days  Time spent: Greater than 50% of the 35 minute visit was spent in counseling/coordination of care for the patient as laid out in the A&P.   Dwyane Dee, MD [facility] 11/21/2019, 5:04 PM  Contact via secure chat.  To contact the attending provider between 7A-7P or the covering  provider during after hours 7P-7A, please log into the web site [email or web address] and access using universal [place] password for that web site. If you do not have the password, please call the hospital operator.

## 2019-11-21 NOTE — Progress Notes (Signed)
Pt [name] called concerned with moms care. She wanted to know how she obtained skin tears to her left arm and was it documented. Writer explained there's notation of what occurred. Daughter wanted pt advocacy number, writer unable to locate a number and provided the supervisors number for further assistance.

## 2019-11-21 NOTE — Progress Notes (Signed)
Pharmacy called RN to inform the IV Penicillin will change to different abx. RN called pharmacy that the new abx was not ordered for tonight, will wait for the pharmacy to place the order of new abx.

## 2019-11-22 DIAGNOSIS — G9601 Cranial cerebrospinal fluid leak, spontaneous: Secondary | ICD-10-CM

## 2019-11-22 LAB — BASIC METABOLIC PANEL
Anion gap: 10 (ref 5–15)
BUN: 18 mg/dL (ref 8–23)
CO2: 27 mmol/L (ref 22–32)
Calcium: 8.7 mg/dL — ABNORMAL LOW (ref 8.9–10.3)
Chloride: 101 mmol/L (ref 98–111)
Creatinine, Ser: 0.94 mg/dL (ref 0.44–1.00)
GFR calc Af Amer: >60 mL/min (ref >60)
GFR calc non Af Amer: 60 mL/min — ABNORMAL LOW (ref >60)
Glucose, Bld: 166 mg/dL — ABNORMAL HIGH (ref 70–99)
Potassium: 4.6 mmol/L (ref 3.5–5.1)
Sodium: 138 mmol/L (ref 135–145)

## 2019-11-22 LAB — CBC WITH DIFFERENTIAL/PLATELET
Abs Immature Granulocytes: 0.07 10*3/uL (ref 0.00–0.07)
Basophils Absolute: 0 10*3/uL (ref 0.0–0.1)
Basophils Relative: 0 %
Eosinophils Absolute: 0.6 10*3/uL — ABNORMAL HIGH (ref 0.0–0.5)
Eosinophils Relative: 5 %
HCT: 30.7 % — ABNORMAL LOW (ref 36.0–46.0)
Hemoglobin: 10 g/dL — ABNORMAL LOW (ref 12.0–15.0)
Immature Granulocytes: 1 %
Lymphocytes Relative: 12 %
Lymphs Abs: 1.4 10*3/uL (ref 0.7–4.0)
MCH: 30.2 pg (ref 26.0–34.0)
MCHC: 32.6 g/dL (ref 30.0–36.0)
MCV: 92.7 fL (ref 80.0–100.0)
Monocytes Absolute: 1.1 10*3/uL — ABNORMAL HIGH (ref 0.1–1.0)
Monocytes Relative: 10 %
Neutro Abs: 8.3 10*3/uL — ABNORMAL HIGH (ref 1.7–7.7)
Neutrophils Relative %: 72 %
Platelets: 233 10*3/uL (ref 150–400)
RBC: 3.31 MIL/uL — ABNORMAL LOW (ref 3.87–5.11)
RDW: 13.4 % (ref 11.5–15.5)
WBC: 11.4 10*3/uL — ABNORMAL HIGH (ref 4.0–10.5)
nRBC: 0 % (ref 0.0–0.2)

## 2019-11-22 LAB — CULTURE, BLOOD (ROUTINE X 2)
Culture: NO GROWTH
Culture: NO GROWTH
Special Requests: ADEQUATE
Special Requests: ADEQUATE

## 2019-11-22 LAB — MAGNESIUM: Magnesium: 1.9 mg/dL (ref 1.7–2.4)

## 2019-11-22 LAB — GLUCOSE, CAPILLARY
Glucose-Capillary: 140 mg/dL — ABNORMAL HIGH (ref 70–99)
Glucose-Capillary: 158 mg/dL — ABNORMAL HIGH (ref 70–99)
Glucose-Capillary: 221 mg/dL — ABNORMAL HIGH (ref 70–99)
Glucose-Capillary: 259 mg/dL — ABNORMAL HIGH (ref 70–99)

## 2019-11-22 NOTE — Care Management Important Message (Signed)
Important Message  Patient Details  Name: Andrea [name] MRN: [id number] Date of Birth: 08-10-1945   Medicare Important Message Given:  Yes     [name] 11/22/2019, 1:08 PM

## 2019-11-22 NOTE — Progress Notes (Signed)
   11/22/19 1900  Clinical Encounter Type  Visited With Patient  Visit Type Follow-up  Referral From Nurse   Referral received from Unit Director to support the patient. Upon arrival, the patient was observed to be eating dinner. She reported that she was unable to finish her fruit because she needs softer foods. The patient also reported that today, she has seen some improvement in her condition since her arrival; she is encouraged by this. She shared the history of her present illness, as well as some of the decisions that need to be made regarding her future care. The patient confirms good support from her family (which includes three adult daughters) and she feels well-cared for. She is particularly grateful that her granddaughter phoned her on the day of trip to the ED because she noted that she "didn't sound quite right." The patient is a person of [place] and she is utilizing her faith to understand her present circumstances. This chaplain provided support in the form of active and reflective listening, compassionate ministerial presence, theological reflection, and life-affirming humor.  Gennaro Africa, Chaplain

## 2019-11-22 NOTE — Progress Notes (Signed)
Writer administered PRN norco for pt c/o headache

## 2019-11-22 NOTE — Progress Notes (Signed)
Pt sitting in chair, visiting with family. RN changed dressings on skin tears and cleaned skin tears. Pt denies any further needs at this time, call bell within reach, RN number on board.

## 2019-11-22 NOTE — Progress Notes (Signed)
Pt sitting in chair, family visiting. RN gave prn tylenol for headache, RN lowered [place] o2 to 2 L [place]. Pt denies any further needs at this time, call bell within reach, RN number on pt's board.

## 2019-11-22 NOTE — Plan of Care (Signed)

## 2019-11-22 NOTE — Progress Notes (Signed)
ID Pt stable No fever  mild headache Has rhinorrhea  Patient Vitals for the past 24 hrs:  BP Temp Temp src Pulse Resp SpO2  11/22/19 0736 139/61 98.3 F (36.8 C) Oral 98 17 92 %  11/21/19 2310 (!) 157/67 98.2 F (36.8 C) Oral 92 18 100 %    O/e awake and alert X5 Siting in chair Verbal Chest cta Hss1s2 abd soft Cns non focal  CBC Latest Ref Rng & Units 11/22/2019 11/21/2019 11/19/2019  WBC 4.0 - 10.5 K/uL 11.4(H) 12.2(H) 12.3(H)  Hemoglobin 12.0 - 15.0 g/dL 10.0(L) 9.2(L) 10.2(L)  Hematocrit 36 - 46 % 30.7(L) 28.2(L) 29.5(L)  Platelets 150 - 400 K/uL 233 167 147(L)    CMP Latest Ref Rng & Units 11/22/2019 11/21/2019 11/20/2019  Glucose 70 - 99 mg/dL 166(H) 109(H) 237(H)  BUN 8 - 23 mg/dL 18 18 22   Creatinine 0.44 - 1.00 mg/dL 0.94 0.94 1.05(H)  Sodium 135 - 145 mmol/L 138 137 137  Potassium 3.5 - 5.1 mmol/L 4.6 3.9 4.2  Chloride 98 - 111 mmol/L 101 102 105  CO2 22 - 32 mmol/L 27 27 24   Calcium 8.9 - 10.3 mg/dL 8.7(L) 8.6(L) 8.7(L)  Total Protein 6.5 - 8.1 g/dL - - -  Total Bilirubin 0.3 - 1.2 mg/dL - - -  Alkaline Phos 38 - 126 U/L - - -  AST 15 - 41 U/L - - -  ALT 0 - 44 U/L - - -   Imaging precontrast- fluid in sphenoid sinus left   post contrast    Impression/recommendation  74 year old female with history of diabetes mellitus presents with headache and confusion Encephalopathy with pneumococcal bacteremia raises suspicion for pneumococcal meningitis. But patient is completely awake alert and oriented x5 today with some headache. She may have had a mild form of meningitis. 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 No fracture, mass, or bone defect identified. No meningocele identified. Awaiting trasnfer to [facility] for skull base ENT surgery to close the leak On ceftriaxone 2  grams IV q 12 Headache due to cef leak- causing intracranial hypotension?  Repeat blood cultures neg  2D echowas a limited study.TEE not needed as endocarditis is less likely especially we know that she has a csf leak  Diabetes mellitus management as per primary team  Discussed the management with patient and her daughter and care team- transfer to a center with skull based ENT surgeon services  For correction of csf leakis pending

## 2019-11-22 NOTE — Progress Notes (Signed)
Supervisor notified about pt [name])  calling with concerns about her care

## 2019-11-22 NOTE — Progress Notes (Signed)
PROGRESS NOTE    Andrea [name]   JSH:[id number]  DOB: 11-Feb-1946  DOA: 11/15/2019     7  PCP: Rubye Beach  CC: fever, AMS  Hospital Course: Andrea [name] is a 74 year old female with CAD, DM 2 and recurrent UTI and pneumonia who was admitted 11/15/2019 with fever, leukocytosis and altered mental status.  Patient was started on broad-spectrum antibiotics to cover treatment of meningitis although patient refused lumbar puncture.  She was treated with cefepime, vancomycin, ampicillin, metronidazole and acyclovir.  Blood cultures became positive for strep pneumonia with no clear source (initially) as chest x-ray was negative initially.   Antibiotics were deescalated to vancomycin and ceftriaxone.  Patient improved clinically with resumption of normal mental status however did have lingering headache (6/10).  She was seen by ID who recommended continue ceftriaxone for 14 days until MIC of pneumococcus has returned. Once sensitivies returned she was de-escalated further to PCN.  Vancomycin was also discontinued.  On 11/19/19 she developed a clear nasal discharge from her left nare that appeared more CSF in consistency than mucous. A beta-2 transferrin was sent for testing.   On 11/20/19 she was to undergo TEE however developed worsening cough/SOB. She then underwent a CXR which showed bilateral upper airspace opacities concerning for PNA.   She also underwent a CT cisternogram and after serial imagining she was considered to have a CSF leak per radiology read (beta-2 transferrin still pending).  Her case was discussed with [facility] (Dr. Dub Mikes) for transfer. She was accepted for transfer pending bed availability and placed on transfer wait list (have spoke with Dr. Etta Quill with hospitalist team).  In the meantime she was continued on antibiotics for treatment of her underlying infections (Strep pna bacteremia, presumed meningitis, and PNA).    Interval History:  No acute events  overnight.  She does seem a little more confused this morning and having trouble relaying her thoughts.  Her daughter Almyra Free is bedside and also agrees that she seems more confused today and she is also constantly holding a tissue to her left nose due to ongoing nasal drip. We also called and discussed on speaker phone with her other [name].  All questions were addressed and answered. Overall, I have explained that we have called multiple institutions and she is now on the [facility] transfer list and awaiting transfer.  We will monitor her day by day and adjust treatment plan as necessary.  Family understands and is in agreement.  Old records reviewed in assessment of this patient  ROS: Constitutional: negative for chills and fevers, Respiratory: positive for cough, Cardiovascular: negative for chest pain and Gastrointestinal: negative for abdominal pain  Assessment & Plan: Sepsis -resolved Pneumococcal bacteremia Acute metabolic encephalopathy - resolved Acute hypoxic respiratory failure - clinically has improved with ongoing downtrend in WBC, stable vitals, and afebrile - continue abx - wean O2 as able, currently 2L - given PNA being noted on CXR and CSF leak noted on CT and with known culture data, it is now presumed that her bacteremia and presumed meningitis is all attributed to the Strep pna; therefore TEE can be cancelled (as per ID also)  CSF leak, high suspicion - CT cisternogram shows what appears to be CSF leak into left sphenoid sinus (no obvious fx and patient denies trauma, etc) - follow up beta 2 transferrin - abx broadened out again on 9/15 to CTX to cover wider while awaiting transfer - have spoke with [facility] (Dr. Dub Mikes). Currently patient  on wait list for transfer to hospitalist team (Dr. Etta Quill was accepting physician with hospitalist team, but bed status may still take days)  Headache This may be secondary to resolving meningitis - continue supportive care - if  worsens will consider repeat imagining but just had CT cisternogram on 9/14 as well  DM2 - continue Lantus; adjust as needed - continue SSI and CBGs  Chronic LBP/hip pain with spinal stenosis Continue Norco  COPD No evidence for acute flare Continue O2 at home doses As needed inhaled bronchodilators  CAD No chest pain, continue metoprolol, losartan, aspirin and statin  Antimicrobials: Ampicillin 9/9>>9/10 Cefepime 9/10 x 1 Rocephin 9/9>>9/13 Flagyl 9/10 x 2 Vanc 9/9 >> 9/12 PCN G 9/13 >> 9/15 Rocephin 9/15>> current    DVT prophylaxis: Lovenox Code Status: Full Family Communication: Daughter bedside Disposition Plan: Status is: Inpatient  Remains inpatient appropriate because:Ongoing diagnostic testing needed not appropriate for outpatient work up, Unsafe d/c plan, IV treatments appropriate due to intensity of illness or inability to take PO and Inpatient level of care appropriate due to severity of illness   Dispo: The patient is from: Home              Anticipated d/c is to: transfer to [facility] when bed available              Anticipated d/c date is: 3 days              Patient currently is not medically stable to d/c.  Objective: Blood pressure 139/61, pulse 98, temperature 98.3 F (36.8 C), temperature source Oral, resp. rate 17, height 5' (1.524 m), weight 72.8 kg, SpO2 92 %.  Examination: General appearance: Mentation is a little slower and she appears a little more confused but still is awake, alert, and able to answer all questions just at a slower rate and sometimes says things that do not make full sense Head: Normocephalic, without obvious abnormality, atraumatic Eyes: EOMI Lungs: coarse breath sounds bilaterally Heart: regular rate and rhythm and S1, S2 normal Abdomen: normal findings: bowel sounds normal and soft, non-tender Extremities: no edema Skin: Skin abrasion noted on left wrist covered with dressing.  Senile purpura noted throughout and skin  is very soft and fragile throughout Neurologic: Grossly normal  Consultants:   ID  Neurosurgery  Procedures:   n/a  Data Reviewed: I have personally reviewed following labs and imaging studies Results for orders placed or performed during the hospital encounter of 11/15/19 (from the past 24 hour(s))  Glucose, capillary     Status: Abnormal   Collection Time: 11/21/19  5:06 PM  Result Value Ref Range   Glucose-Capillary 312 (H) 70 - 99 mg/dL  Glucose, capillary     Status: Abnormal   Collection Time: 11/21/19  8:52 PM  Result Value Ref Range   Glucose-Capillary 299 (H) 70 - 99 mg/dL   Comment 1 Notify RN   Basic metabolic panel     Status: Abnormal   Collection Time: 11/22/19  5:17 AM  Result Value Ref Range   Sodium 138 135 - 145 mmol/L   Potassium 4.6 3.5 - 5.1 mmol/L   Chloride 101 98 - 111 mmol/L   CO2 27 22 - 32 mmol/L   Glucose, Bld 166 (H) 70 - 99 mg/dL   BUN 18 8 - 23 mg/dL   Creatinine, Ser 0.94 0.44 - 1.00 mg/dL   Calcium 8.7 (L) 8.9 - 10.3 mg/dL   GFR calc non Af Amer 60 (L) >60  mL/min   GFR calc Af Amer >60 >60 mL/min   Anion gap 10 5 - 15  CBC with Differential/Platelet     Status: Abnormal   Collection Time: 11/22/19  5:17 AM  Result Value Ref Range   WBC 11.4 (H) 4.0 - 10.5 K/uL   RBC 3.31 (L) 3.87 - 5.11 MIL/uL   Hemoglobin 10.0 (L) 12.0 - 15.0 g/dL   HCT 30.7 (L) 36 - 46 %   MCV 92.7 80.0 - 100.0 fL   MCH 30.2 26.0 - 34.0 pg   MCHC 32.6 30.0 - 36.0 g/dL   RDW 13.4 11.5 - 15.5 %   Platelets 233 150 - 400 K/uL   nRBC 0.0 0.0 - 0.2 %   Neutrophils Relative % 72 %   Neutro Abs 8.3 (H) 1.7 - 7.7 K/uL   Lymphocytes Relative 12 %   Lymphs Abs 1.4 0.7 - 4.0 K/uL   Monocytes Relative 10 %   Monocytes Absolute 1.1 (H) 0 - 1 K/uL   Eosinophils Relative 5 %   Eosinophils Absolute 0.6 (H) 0 - 0 K/uL   Basophils Relative 0 %   Basophils Absolute 0.0 0 - 0 K/uL   Immature Granulocytes 1 %   Abs Immature Granulocytes 0.07 0.00 - 0.07 K/uL  Magnesium      Status: None   Collection Time: 11/22/19  5:17 AM  Result Value Ref Range   Magnesium 1.9 1.7 - 2.4 mg/dL  Glucose, capillary     Status: Abnormal   Collection Time: 11/22/19  7:38 AM  Result Value Ref Range   Glucose-Capillary 140 (H) 70 - 99 mg/dL  Glucose, capillary     Status: Abnormal   Collection Time: 11/22/19 11:24 AM  Result Value Ref Range   Glucose-Capillary 158 (H) 70 - 99 mg/dL    Recent Results (from the past 240 hour(s))  Urine culture     Status: Abnormal   Collection Time: 11/15/19 11:03 AM   Specimen: In/Out Cath Urine  Result Value Ref Range Status   Specimen Description   Final    IN/OUT CATH URINE Performed at [facility], [address] [place], [place] [address]    Special Requests   Final    NONE Performed at [facility], [address] [place], [place] [address]    Culture >=100,000 COLONIES/mL ESCHERICHIA COLI (A)  Final   Report Status 11/18/2019 FINAL  Final   Organism ID, Bacteria ESCHERICHIA COLI (A)  Final      Susceptibility   Escherichia coli - MIC*    AMPICILLIN >=32 RESISTANT Resistant     CEFAZOLIN <=4 SENSITIVE Sensitive     CEFTRIAXONE <=0.25 SENSITIVE Sensitive     CIPROFLOXACIN <=0.25 SENSITIVE Sensitive     GENTAMICIN <=1 SENSITIVE Sensitive     IMIPENEM <=0.25 SENSITIVE Sensitive     NITROFURANTOIN <=16 SENSITIVE Sensitive     TRIMETH/SULFA <=20 SENSITIVE Sensitive     AMPICILLIN/SULBACTAM 8 SENSITIVE Sensitive     PIP/TAZO <=4 SENSITIVE Sensitive     * >=100,000 COLONIES/mL ESCHERICHIA COLI  Blood culture (routine x 2)     Status: Abnormal   Collection Time: 11/15/19  6:38 PM   Specimen: BLOOD  Result Value Ref Range Status   Specimen Description   Final    BLOOD LEFT ANTECUBITAL Performed at [facility], [place] [address], [place], [place] [address]    Special Requests   Final    BOTTLES DRAWN AEROBIC AND ANAEROBIC Blood  Culture results may not be optimal due to an excessive volume of blood  received in culture bottles Performed at [facility], [address] [place], [place] [address]    Culture  Setup Time   Final    Organism ID to follow [place] CRITICAL RESULT CALLED TO, READ BACK BY AND VERIFIED WITH: SUSAN WATSON AT 0820 ON 11/16/19 SNG Performed at [facility], [address] [place], [place] [address]    Culture STREPTOCOCCUS PNEUMONIAE (A)  Final   Report Status 11/18/2019 FINAL  Final   Organism ID, Bacteria STREPTOCOCCUS PNEUMONIAE  Final      Susceptibility   Streptococcus pneumoniae - MIC*    ERYTHROMYCIN <=0.12 SENSITIVE Sensitive     LEVOFLOXACIN 0.5 SENSITIVE Sensitive     VANCOMYCIN 0.5 SENSITIVE Sensitive     PENICILLIN (meningitis) <=0.06 SENSITIVE Sensitive     PENO - penicillin <=0.06      PENICILLIN (non-meningitis) <=0.06 SENSITIVE Sensitive     PENICILLIN (oral) <=0.06 SENSITIVE Sensitive     CEFTRIAXONE (non-meningitis) <=0.12 SENSITIVE Sensitive     CEFTRIAXONE (meningitis) <=0.12 SENSITIVE Sensitive     * STREPTOCOCCUS PNEUMONIAE  SARS Coronavirus 2 by RT PCR (hospital order, performed in [place] hospital lab) Nasopharyngeal Nasopharyngeal Swab     Status: None   Collection Time: 11/15/19  6:38 PM   Specimen: Nasopharyngeal Swab  Result Value Ref Range Status   SARS Coronavirus 2 NEGATIVE NEGATIVE Final    Comment: (NOTE) SARS-CoV-2 target nucleic acids are NOT DETECTED.  The SARS-CoV-2 RNA is generally detectable in upper and lower respiratory specimens during the acute phase of infection. The lowest concentration of SARS-CoV-2 viral copies this assay can detect is 250 copies / mL. A negative result does not preclude SARS-CoV-2 infection and should not be used as the sole basis for treatment or other patient management decisions.  A negative result may occur with improper specimen collection / handling, submission of specimen other than nasopharyngeal swab,  presence of viral mutation(s) within the areas targeted by this assay, and inadequate number of viral copies (<250 copies / mL). A negative result must be combined with clinical observations, patient history, and epidemiological information.  Fact Sheet for Patients:   StrictlyIdeas.no  Fact Sheet for Healthcare Providers: BankingDealers.co.za  This test is not yet approved or  cleared by the [place] FDA and has been authorized for detection and/or diagnosis of SARS-CoV-2 by FDA under an Emergency Use Authorization (EUA).  This EUA will remain in effect (meaning this test can be used) for the duration of the COVID-19 declaration under Section 564(b)(1) of the Act, 21 U.S.C. section 360bbb-3(b)(1), unless the authorization is terminated or revoked sooner.  Performed at [facility], [address] [place], [place] [address]   Blood Culture ID Panel (Reflexed)     Status: Abnormal   Collection Time: 11/15/19  6:38 PM  Result Value Ref Range Status   Enterococcus faecalis NOT DETECTED NOT DETECTED Final   Enterococcus Faecium NOT DETECTED NOT DETECTED Final   Listeria monocytogenes NOT DETECTED NOT DETECTED Final   Staphylococcus species NOT DETECTED NOT DETECTED Final   Staphylococcus aureus (BCID) NOT DETECTED NOT DETECTED Final   Staphylococcus epidermidis NOT DETECTED NOT DETECTED Final   Staphylococcus lugdunensis NOT DETECTED NOT DETECTED Final   Streptococcus species DETECTED (A) NOT DETECTED Final    Comment: CRITICAL RESULT CALLED TO, READ BACK BY AND VERIFIED WITH: SUSAN WATSON AT  0820 ON 11/16/19 SNG    Streptococcus agalactiae NOT DETECTED NOT DETECTED Final   Streptococcus pneumoniae DETECTED (A) NOT DETECTED Final    Comment: CRITICAL RESULT CALLED TO, READ BACK BY AND VERIFIED WITH: SUSAN WATSON AT 0820 ON 11/16/19 SNG    Streptococcus pyogenes NOT DETECTED NOT DETECTED Final   A.calcoaceticus-baumannii  NOT DETECTED NOT DETECTED Final   Bacteroides fragilis NOT DETECTED NOT DETECTED Final   Enterobacterales NOT DETECTED NOT DETECTED Final   Enterobacter cloacae complex NOT DETECTED NOT DETECTED Final   Escherichia coli NOT DETECTED NOT DETECTED Final   Klebsiella aerogenes NOT DETECTED NOT DETECTED Final   Klebsiella oxytoca NOT DETECTED NOT DETECTED Final   Klebsiella pneumoniae NOT DETECTED NOT DETECTED Final   Proteus species NOT DETECTED NOT DETECTED Final   Salmonella species NOT DETECTED NOT DETECTED Final   Serratia marcescens NOT DETECTED NOT DETECTED Final   Haemophilus influenzae NOT DETECTED NOT DETECTED Final   Neisseria meningitidis NOT DETECTED NOT DETECTED Final   Pseudomonas aeruginosa NOT DETECTED NOT DETECTED Final   Stenotrophomonas maltophilia NOT DETECTED NOT DETECTED Final   Candida albicans NOT DETECTED NOT DETECTED Final   Candida auris NOT DETECTED NOT DETECTED Final   Candida glabrata NOT DETECTED NOT DETECTED Final   Candida krusei NOT DETECTED NOT DETECTED Final   Candida parapsilosis NOT DETECTED NOT DETECTED Final   Candida tropicalis NOT DETECTED NOT DETECTED Final   Cryptococcus neoformans/gattii NOT DETECTED NOT DETECTED Final    Comment: Performed at [facility], [address] [place], [place] [address]  Culture, blood (Routine X 2) w Reflex to ID Panel     Status: None   Collection Time: 11/16/19 11:53 PM   Specimen: BLOOD  Result Value Ref Range Status   Specimen Description BLOOD BLOOD LEFT FOREARM  Final   Special Requests   Final    BOTTLES DRAWN AEROBIC AND ANAEROBIC Blood Culture adequate volume   Culture   Final    NO GROWTH 5 DAYS Performed at [facility], [address] [place], [place] [address]    Report Status 11/22/2019 FINAL  Final  Culture, blood (Routine X 2) w Reflex to ID Panel     Status: None   Collection Time: 11/16/19 11:53 PM   Specimen: BLOOD  Result Value Ref Range Status   Specimen  Description BLOOD BLOOD LEFT HAND  Final   Special Requests   Final    BOTTLES DRAWN AEROBIC AND ANAEROBIC Blood Culture adequate volume   Culture   Final    NO GROWTH 5 DAYS Performed at [facility], [address] [place],  [address]    Report Status 11/22/2019 FINAL  Final     Radiology Studies: No results found. CT MAXILLOFACIAL W & WO CONTRAST  Final Result    DG FLUORO GUIDED NEEDLE PLC ASPIRATION/INJECTION LOC  Final Result    DG Chest Port 1 View  Final Result    DG Chest Port 1 View  Final Result    CT Head Wo Contrast  Final Result      Scheduled Meds: . aspirin EC  81 mg Oral Daily  . atorvastatin  80 mg Oral QHS  . enoxaparin (LOVENOX) injection  40 mg Subcutaneous Q24H  . feeding supplement (GLUCERNA SHAKE)  237 mL Oral Q24H  . insulin aspart  0-20 Units Subcutaneous TID AC & HS  . insulin glargine  65 Units Subcutaneous Q supper  . lidocaine-prilocaine   Topical Once  .  loratadine  10 mg Oral Daily  . losartan  25 mg Oral BH-q7a  . metoprolol succinate  25 mg Oral BH-q7a  . oxybutynin  10 mg Oral BH-q7a  . pantoprazole  40 mg Oral Daily  . sertraline  25 mg Oral Daily  . vitamin B-12  1,000 mcg Oral Daily   PRN Meds: acetaminophen **OR** acetaminophen, aspirin-acetaminophen-caffeine, HYDROcodone-acetaminophen, ipratropium-albuterol, morphine injection, ondansetron **OR** ondansetron (ZOFRAN) IV Continuous Infusions: . cefTRIAXone (ROCEPHIN)  IV 2 g (11/22/19 1010)      LOS: 7 days  Time spent: Greater than 50% of the 35 minute visit was spent in counseling/coordination of care for the patient as laid out in the A&P.   Dwyane Dee, MD [facility] 11/22/2019, 3:43 PM  Contact via secure chat.  To contact the attending provider between 7A-7P or the covering provider during after hours 7P-7A, please log into the web site [email or web address] and access using universal [place] password for that web site. If you do not have  the password, please call the hospital operator.

## 2019-11-22 NOTE — Progress Notes (Signed)
Pt sitting in chair waiting on lunch, scheduled meds given. Pt denies any needs at this time, call bell and phone within reach

## 2019-11-23 DIAGNOSIS — G9601 Cranial cerebrospinal fluid leak, spontaneous: Secondary | ICD-10-CM

## 2019-11-23 DIAGNOSIS — J9601 Acute respiratory failure with hypoxia: Secondary | ICD-10-CM

## 2019-11-23 DIAGNOSIS — Z8661 Personal history of infections of the central nervous system: Secondary | ICD-10-CM

## 2019-11-23 DIAGNOSIS — J189 Pneumonia, unspecified organism: Secondary | ICD-10-CM

## 2019-11-23 LAB — CBC WITH DIFFERENTIAL/PLATELET
Abs Immature Granulocytes: 0.07 10*3/uL (ref 0.00–0.07)
Basophils Absolute: 0 10*3/uL (ref 0.0–0.1)
Basophils Relative: 0 %
Eosinophils Absolute: 0.6 10*3/uL — ABNORMAL HIGH (ref 0.0–0.5)
Eosinophils Relative: 5 %
HCT: 28.4 % — ABNORMAL LOW (ref 36.0–46.0)
Hemoglobin: 8.9 g/dL — ABNORMAL LOW (ref 12.0–15.0)
Immature Granulocytes: 1 %
Lymphocytes Relative: 16 %
Lymphs Abs: 1.7 10*3/uL (ref 0.7–4.0)
MCH: 29.8 pg (ref 26.0–34.0)
MCHC: 31.3 g/dL (ref 30.0–36.0)
MCV: 95 fL (ref 80.0–100.0)
Monocytes Absolute: 1.1 10*3/uL — ABNORMAL HIGH (ref 0.1–1.0)
Monocytes Relative: 10 %
Neutro Abs: 7.7 10*3/uL (ref 1.7–7.7)
Neutrophils Relative %: 68 %
Platelets: 231 10*3/uL (ref 150–400)
RBC: 2.99 MIL/uL — ABNORMAL LOW (ref 3.87–5.11)
RDW: 13.3 % (ref 11.5–15.5)
WBC: 11.2 10*3/uL — ABNORMAL HIGH (ref 4.0–10.5)
nRBC: 0 % (ref 0.0–0.2)

## 2019-11-23 LAB — MISC LABCORP TEST (SEND OUT): Labcorp test code: 829030

## 2019-11-23 LAB — GLUCOSE, CAPILLARY
Glucose-Capillary: 95 mg/dL (ref 70–99)
Glucose-Capillary: 180 mg/dL — ABNORMAL HIGH (ref 70–99)
Glucose-Capillary: 335 mg/dL — ABNORMAL HIGH (ref 70–99)
Glucose-Capillary: 392 mg/dL — ABNORMAL HIGH (ref 70–99)

## 2019-11-23 LAB — BASIC METABOLIC PANEL
Anion gap: 10 (ref 5–15)
BUN: 20 mg/dL (ref 8–23)
CO2: 28 mmol/L (ref 22–32)
Calcium: 8.6 mg/dL — ABNORMAL LOW (ref 8.9–10.3)
Chloride: 100 mmol/L (ref 98–111)
Creatinine, Ser: 1.03 mg/dL — ABNORMAL HIGH (ref 0.44–1.00)
GFR calc Af Amer: >60 mL/min (ref >60)
GFR calc non Af Amer: 54 mL/min — ABNORMAL LOW (ref >60)
Glucose, Bld: 95 mg/dL (ref 70–99)
Potassium: 4.4 mmol/L (ref 3.5–5.1)
Sodium: 138 mmol/L (ref 135–145)

## 2019-11-23 LAB — MAGNESIUM: Magnesium: 2.1 mg/dL (ref 1.7–2.4)

## 2019-11-23 MED ORDER — POLYETHYLENE GLYCOL 3350 17 G PO PACK
17.0000 g | PACK | Freq: Every day | ORAL | Status: DC
  Filled 2019-11-23 (×2): qty 1

## 2019-11-23 MED ORDER — SENNOSIDES-DOCUSATE SODIUM 8.6-50 MG PO TABS
1.0000 | ORAL_TABLET | Freq: Two times a day (BID) | ORAL | Status: DC
  Administered 2019-11-24: 1 via ORAL
  Filled 2019-11-23 (×3): qty 1

## 2019-11-23 NOTE — Progress Notes (Signed)
Referring Physician:  No referring provider defined for this encounter.  Primary Physician:  Mar Daring, PA-C  Chief Complaint: headache and runny nose x 2-3 weeks  History of Present Illness: Andrea [name] was seen this morning. She is alert, oriented. Neurologic exam remains intact.  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 She is awaiting transport to another facility for treatment, and continues on antibiotics.  She reports that she is doing "better" and denies headaches currently.  Interval History from initial consult note 11/20/2019: Andrea [name] is a 74 y.o. female who presents with the chief complaint of headache and rhinorrhea x 2-3 weeks. She does not recall the events that brought her to the hospital, as she was "out of it" however her daughter is at bedside and does report that she called EMS for her mother, as she was experiencing altered mental status, fever, and chills.  She said she had spoken to her mother on the phone prior to calling EMS and she was confused, slurring her words, and not oriented, unable to get her thoughts out or words out.  Thus she was brought to [facility] for further evaluation.  Her initial diagnosis was sepsis with acute metabolic encephalopathy.  Blood cultures taken on 11/15/2019 were positive for strep pneumonia.  ID has been consulted, and due to her rhinorrhea and persistent headaches, it was recommended to consult neurosurgery.  Nasal drainage fluid has been sent to the lab for evaluation for beta transferrin which would be concerning for CSF leak, results are still pending.  [name] does have longstanding lower back pain however does not endorse that this is any worse than her usual today on exam.  On exam, she has no evidence of focal neurologic deficit.  She is alert and oriented x4.  She does complain of a persistent frontal headache without photophobia.  Denies any neck pain.  Has been ambulating with  a walker in the room.  Does endorse persistent rhinorrhea for the last 2 to 3 weeks.   Review of Systems:  A 10 point review of systems is negative, except for the pertinent positives and negatives detailed in the HPI.  Past Medical History: Past Medical History:  Diagnosis Date  . Bronchitis   . Chickenpox   . Coronary artery disease   . Depression   . Diabetes mellitus type 2, uncomplicated ([place])   . Diabetes mellitus without complication ([place])   . Diabetic retinopathy ([place])   . Dupuytren contracture   . Frequent urinary tract infections   . GERD (gastroesophageal reflux disease)   . History of hand surgery 2011   left hand  . Hyperlipidemia, unspecified   . Irritable bowel syndrome   . MI (myocardial infarction) ([place]) 1994  . Neuromuscular disorder ([place])    nerve pain in back and legs  . Osteoporosis   . Pneumonia 2014  . Stroke ([place])   . TIA (transient ischemic attack)   . Vitamin D deficiency, unspecified   . Wheezing     Past Surgical History: Past Surgical History:  Procedure Laterality Date  . CARDIAC CATHETERIZATION  1994  . COLONOSCOPY     x3  . COLONOSCOPY WITH PROPOFOL N/A 02/08/2019   Procedure: COLONOSCOPY WITH PROPOFOL;  Surgeon: [name], MD;  Location: [facility] ENDOSCOPY;  Service: Gastroenterology;  Laterality: N/A;  . CORONARY ANGIOPLASTY  1994  . EYE SURGERY Bilateral    cataracts  . HAND SURGERY Left 2011  left hand release of contractures  . HIP FRACTURE SURGERY  11/2013  . PR COLSC FLX W/REMOVAL LESION BY HOT BX FORCEPS   08/21/2015   Procedure: COLONOSCOPY, FLEXIBLE, PROXIMAL TO SPLENIC FLEXURE; W/REMOVAL TUMOR/POLYP/OTHER LESION, HOT BX FORCEP/CAUTE; Surgeon: Carlena Hurl, MD; Location: [facility] [place]; Service: General Surgery  . TEE WITHOUT CARDIOVERSION N/A 11/20/2019   Procedure: TRANSESOPHAGEAL ECHOCARDIOGRAM (TEE);  Surgeon: Nelva Bush, MD;  Location: [facility] ORS;  Service: Cardiovascular;  Laterality: N/A;     Allergies: Allergies as of 11/15/2019 - Review Complete 11/15/2019  Allergen Reaction Noted  . Metformin and related Diarrhea 07/08/2015  . Sulfa antibiotics Rash 07/08/2015    Medications:  Current Facility-Administered Medications:  .  acetaminophen (TYLENOL) tablet 650 mg, 650 mg, Oral, Q6H PRN, 650 mg at 11/23/19 0651 **OR** acetaminophen (TYLENOL) suppository 650 mg, 650 mg, Rectal, Q6H PRN, Jonelle Sidle, Mohammad L, MD .  aspirin EC tablet 81 mg, 81 mg, Oral, Daily, Gala Romney L, MD, 81 mg at 11/23/19 0914 .  aspirin-acetaminophen-caffeine (EXCEDRIN MIGRAINE) per tablet 1 tablet, 1 tablet, Oral, Q8H PRN, Vashti Hey, MD, 1 tablet at 11/20/19 0918 .  atorvastatin (LIPITOR) tablet 80 mg, 80 mg, Oral, QHS, Garba, Mohammad L, MD, 80 mg at 11/22/19 2149 .  cefTRIAXone (ROCEPHIN) 2 g in sodium chloride 0.9 % 100 mL IVPB, 2 g, Intravenous, Q12H, Tsosie Billing, MD, Stopped at 11/22/19 2228 .  enoxaparin (LOVENOX) injection 40 mg, 40 mg, Subcutaneous, Q24H, Garba, Mohammad L, MD, 40 mg at 11/22/19 2314 .  feeding supplement (GLUCERNA SHAKE) (GLUCERNA SHAKE) liquid 237 mL, 237 mL, Oral, Q24H, Fritzi Mandes, MD, 237 mL at 11/23/19 0913 .  HYDROcodone-acetaminophen (NORCO) 10-325 MG per tablet 1 tablet, 1 tablet, Oral, Q8H PRN, Elwyn Reach, MD, 1 tablet at 11/22/19 2028 .  insulin aspart (novoLOG) injection 0-20 Units, 0-20 Units, Subcutaneous, TID AC & HS, Sharion Settler, NP, 11 Units at 11/22/19 2201 .  insulin glargine (LANTUS) injection 65 Units, 65 Units, Subcutaneous, Q supper, Dwyane Dee, MD, 65 Units at 11/22/19 1747 .  ipratropium-albuterol (DUONEB) 0.5-2.5 (3) MG/3ML nebulizer solution 3 mL, 3 mL, Nebulization, Q6H PRN, Bonnell Public Tublu, MD, 3 mL at 11/20/19 1034 .  lidocaine-prilocaine (EMLA) cream, , Topical, Once, Duffy Bruce, MD .  loratadine (CLARITIN) tablet 10 mg, 10 mg, Oral, Daily, Sharion Settler, NP, 10 mg at 11/23/19 0913 .   losartan (COZAAR) tablet 25 mg, 25 mg, Oral, BH-q7a, Gala Romney L, MD, 25 mg at 11/23/19 0914 .  metoprolol succinate (TOPROL-XL) 24 hr tablet 25 mg, 25 mg, Oral, BH-q7a, Gala Romney L, MD, 25 mg at 11/23/19 0914 .  morphine 2 MG/ML injection 2 mg, 2 mg, Intravenous, Q4H PRN, Gala Romney L, MD, 2 mg at 11/20/19 1028 .  ondansetron (ZOFRAN) tablet 4 mg, 4 mg, Oral, Q6H PRN **OR** ondansetron (ZOFRAN) injection 4 mg, 4 mg, Intravenous, Q6H PRN, Jonelle Sidle, Mohammad L, MD, 4 mg at 11/20/19 1029 .  oxybutynin (DITROPAN-XL) 24 hr tablet 10 mg, 10 mg, Oral, BH-q7a, Gala Romney L, MD, 10 mg at 11/23/19 0914 .  pantoprazole (PROTONIX) EC tablet 40 mg, 40 mg, Oral, Daily, Gala Romney L, MD, 40 mg at 11/23/19 0914 .  sertraline (ZOLOFT) tablet 25 mg, 25 mg, Oral, Daily, Bonnell Public Tublu, MD, 25 mg at 11/23/19 0914 .  vitamin B-12 (CYANOCOBALAMIN) tablet 1,000 mcg, 1,000 mcg, Oral, Daily, Elwyn Reach, MD, 1,000 mcg at 11/23/19 0913   Social History: Social History   Tobacco Use  . Smoking  status: Former Smoker    Packs/day: 1.00    Years: 30.00    Pack years: 30.00    Types: Cigarettes    Quit date: 07/17/2012    Years since quitting: 7.3  . Smokeless tobacco: Never Used  . Tobacco comment:    Vaping Use  . Vaping Use: Never used  Substance Use Topics  . Alcohol use: Not Currently    Alcohol/week: 0.0 standard drinks  . Drug use: No    Family Medical History: Family History  Problem Relation Age of Onset  . Depression Daughter   . Arthritis Daughter        spine  . Plantar fasciitis Daughter   . Anxiety disorder Daughter   . Hyperlipidemia Daughter   . AAA (abdominal aortic aneurysm) Daughter   . Hypertension Mother   . Stroke Mother   . Cataracts Mother   . Depression Mother   . Heart disease Mother   . Asthma Brother   . Diabetes Daughter   . Hyperlipidemia Daughter   . Hypertension Daughter     Physical Examination: Vitals:   11/23/19 0039  11/23/19 0803  BP: (!) 115/49 (!) 129/52  Pulse: 96 90  Resp: 16 18  Temp: 98.3 F (36.8 C) 97.8 F (36.6 C)  SpO2: 96% 95%     General: Patient is well developed, well nourished, calm, collected, and in no apparent distress.  Psychiatric: Patient is non-anxious.  Head:  Pupils equal, round, and reactive to light.  ENT:  Oral mucosa appears dry  Neck:   Supple.  Full range of motion.  No nuchal rigidity noted.  No tenderness to palpation  Respiratory: Patient appears to be in mild respiratory distress.  NEUROLOGICAL:  General: In no acute distress.   Awake, alert, oriented to person, place, and time.   Speech fluent and clear, no dysarthria.   Pupils equal round and reactive to light. EOMI, no nystagmus. Facial tone is symmetric.  Tongue protrusion is midline.  There is no pronator drift. Moving all extremities equally. Sensation intact to bilateral upper and lower extremities.   Imaging: 11/20/2019: EXAM: CT MAXILLOFACIAL WITHOUT AND WITH CONTRAST  TECHNIQUE: Multidetector CT imaging of the maxillofacial structures was performed without and after intrathecal contrast. Multiplanar CT image reconstructions were also generated.  CONTRAST:  25mL OMNIPAQUE IOHEXOL 300 MG/ML SOLN Intrathecal contrast injected in the lumbar spine by Dr. Vernard Gambles.  COMPARISON:  CT head 11/15/2019  FINDINGS: Precontrast CT of the sinuses demonstrates an air-fluid level in the left sphenoid sinus. This fluid density is approximately 50 Hounsfield units. No other air-fluid levels. No bony defect is seen in the skull base. No intracranial mass or meningocele identified. Remaining paranasal sinuses are clear.  Bilateral cataract extraction.  No orbital mass.  Following intrathecal infusion of contrast, repeat CT was performed of the sinuses. Delayed imaging also performed 4 hours later. The patient initially refused delayed scanning however after further discussion with the  patient, this was performed.  The air-fluid level in the left sphenoid sinus demonstrates slight increased density compared to the pre contrast fluid measuring approximately 65-75 Hounsfield units. This was considered indeterminate and therefore delayed imaging was requested and eventually performed. The 4 hour delayed imaging shows definitive contrast in the left sphenoid sinus with high density material measuring approximately 375 Hounsfield units. No other area of contrast leak is identified in the sinuses. No bony defect identified as a cause of the leak.  IMPRESSION: 1. Air-fluid level left sphenoid sinus. On delayed  imaging, there is definitive contrast in the left sphenoid sinus compatible with CSF leak. No cause for the leak identified. No fracture, mass, or bone defect identified. No meningocele identified. Remaining sinuses Clear.  CT Head 11/15/2019: CLINICAL DATA:  Headache  EXAM: CT HEAD WITHOUT CONTRAST  TECHNIQUE: Contiguous axial images were obtained from the base of the skull through the vertex without intravenous contrast.  COMPARISON:  None.  FINDINGS: Brain: No evidence of acute infarction, hemorrhage, hydrocephalus, extra-axial collection or mass lesion/mass effect. Mild age related cerebral volume loss.  Vascular: Atherosclerotic calcifications involving the large vessels of the skull base. No unexpected hyperdense vessel.  Skull: Normal. Negative for fracture or focal lesion.  Sinuses/Orbits: Mild mucosal thickening within the left ethmoid air cells. Remaining visualized paranasal sinuses and mastoid air cells are clear. Orbital structures intact.  Other: None.  IMPRESSION: 1. No acute intracranial findings. 2. Mild left ethmoid sinus disease.  Assessment and Plan: Ms. Belson is a pleasant 74 y.o. female who was admitted to the [facility] hospital with concerns for acute metabolic encephalopathy as well as sepsis. As she was experiencing  rhinorrhea, nasal fluid was sent and a CT Cisternogram was completed to identify CSF leak, which was positive for CSF leak.   - No acute neurosurgical intervention at this time - Appreciate ID recommendations and management - Agree with transfer of patient to appropriate facility for management of CSF leak - Would maintain patient as inpatient until transfer can be arranged.   We thank you for this consult.   Lonell Face, NP Dept. of Neurosurgery

## 2019-11-23 NOTE — Progress Notes (Signed)
Awake and alert - oriented x5 Sitting in chair Doing better appetite better Still has CSF rhinorrhea- awaiting tasnfer to [facility] Continue IV ceftriaxone 2 grams Q 12 for strep pneumo meningitis ID will follow her remotely this weekend.

## 2019-11-23 NOTE — Progress Notes (Signed)
PROGRESS NOTE    Andrea [name]   VEL:[id number]  DOB: 1945/06/04  DOA: 11/15/2019     8  PCP: Rubye Beach  CC: fever, AMS  Hospital Course: Andrea [name] is a 74 year old female with CAD, DM 2 and recurrent UTI and pneumonia who was admitted 11/15/2019 with fever, leukocytosis and altered mental status.  Patient was started on broad-spectrum antibiotics to cover treatment of meningitis although patient refused lumbar puncture.  She was treated with cefepime, vancomycin, ampicillin, metronidazole and acyclovir.  Blood cultures became positive for strep pneumonia with no clear source (initially) as chest x-ray was negative initially.   Antibiotics were deescalated to vancomycin and ceftriaxone.  Patient improved clinically with resumption of normal mental status however did have lingering headache (6/10).  She was seen by ID who recommended continue ceftriaxone for 14 days until MIC of pneumococcus has returned. Once sensitivies returned she was de-escalated further to PCN.  Vancomycin was also discontinued.  On 11/19/19 she developed a clear nasal discharge from her left nare that appeared more CSF in consistency than mucous. A beta-2 transferrin was sent for testing.   On 11/20/19 she was to undergo TEE however developed worsening cough/SOB. She then underwent a CXR which showed bilateral upper airspace opacities concerning for PNA.   She also underwent a CT cisternogram and after serial imagining she was considered to have a CSF leak per radiology read (beta-2 transferrin still pending).  Her case was discussed with [facility] (Dr. Dub Mikes) for transfer. She was accepted for transfer pending bed availability and placed on transfer wait list (have spoke with Dr. Etta Quill with hospitalist team).  In the meantime she was continued on antibiotics for treatment of her underlying infections (Strep pna bacteremia, presumed meningitis, and PNA).    Interval History:  No acute events  overnight. She is more lucid today. Still 4-5/10 headache and still having left rhinorrhea. No other changes. Have been having multiple discussions with Andrea [name] (daughter) on phone as well as with both [facility] and [facility] regarding transfers. Andrea [name] is concerned about both [facility] and [facility] recommending possible discharge and outpatient follow up. I've put her in touch with both physicians at [facility] and she has been told the same thing (that typically these repairs can take place outpatient). However, Andrea [name] still nervous and still hopeful for transfer. Given that we are treating for meningitis as well, keeping patient inpatient hospitalized for now is justified.   Old records reviewed in assessment of this patient  ROS: Constitutional: negative for chills and fevers, Respiratory: positive for cough, Cardiovascular: negative for chest pain and Gastrointestinal: negative for abdominal pain  Assessment & Plan: Sepsis -resolved Strep pna bacteremia Presumed meningitis 2/2 Strep pna (no LP performed on admission) Acute metabolic encephalopathy - resolved Acute hypoxic respiratory failure CAP - clinically has improved with ongoing downtrend in WBC, stable vitals, and afebrile - continue abx - wean O2 as able, currently 2L - given PNA being noted on CXR and CSF leak noted on CT and with known culture data, it is now presumed that her bacteremia and presumed meningitis is all attributed to the Strep pna; therefore TEE can be cancelled (as per ID also)  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 Dub Mikes) and surgeon Dr. Dimple Nanas. Currently patient on wait list for transfer to hospitalist team (Dr. Etta Quill was accepting physician with hospitalist  team, but bed status may still take days); spoke again with transfer center on 9/17 in the afternoon (still on list and still awaiting a bed)  Headache This may be secondary to resolving meningitis - continue supportive care - if worsens will consider repeat imagining but just had CT cisternogram on 9/14 as well  DM2 - continue Lantus; adjust as needed - continue SSI and CBGs  Chronic LBP/hip pain with spinal stenosis Continue Norco  COPD No evidence for acute flare Continue O2 at home doses As needed inhaled bronchodilators  CAD No chest pain, continue metoprolol, losartan, aspirin and statin  Antimicrobials: Ampicillin 9/9>>9/10 Cefepime 9/10 x 1 Rocephin 9/9>>9/13 Flagyl 9/10 x 2 Vanc 9/9 >> 9/12 PCN G 9/13 >> 9/15 Rocephin 9/15>> current    DVT prophylaxis: Lovenox Code Status: Full Family Communication: Daughters Disposition Plan: Status is: Inpatient  Remains inpatient appropriate because:Ongoing diagnostic testing needed not appropriate for outpatient work up, Unsafe d/c plan, IV treatments appropriate due to intensity of illness or inability to take PO and Inpatient level of care appropriate due to severity of illness   Dispo: The patient is from: Home              Anticipated d/c is to: transfer to [facility] when bed available              Anticipated d/c date is: unknown; awaiting a bed              Patient currently is not medically stable to d/c.  Objective: Blood pressure (!) 129/52, pulse 90, temperature 97.8 F (36.6 C), temperature source Oral, resp. rate 18, height 5' (1.524 m), weight 72.8 kg, SpO2 95 %.  Examination: General appearance: Mentation is better today.  More awake and alert and less confused appearing.  Still having ongoing headache and discharge from her left nare Head: Normocephalic, without obvious abnormality, atraumatic Eyes: EOMI Lungs: coarse breath sounds bilaterally Heart: regular rate and rhythm and S1, S2 normal Abdomen:  normal findings: bowel sounds normal and soft, non-tender Extremities: no edema Skin: Skin abrasion noted on left wrist covered with dressing.  Senile purpura noted throughout and skin is very soft and fragile throughout Neurologic: Grossly normal  Consultants:   ID  Neurosurgery  Procedures:   n/a  Data Reviewed: I have personally reviewed following labs and imaging studies Results for orders placed or performed during the hospital encounter of 11/15/19 (from the past 24 hour(s))  Glucose, capillary     Status: Abnormal   Collection Time: 11/22/19  4:55 PM  Result Value Ref Range   Glucose-Capillary 221 (H) 70 - 99 mg/dL  Glucose, capillary     Status: Abnormal   Collection Time: 11/22/19  9:16 PM  Result Value Ref Range   Glucose-Capillary 259 (H) 70 - 99 mg/dL   Comment 1 Notify RN    Comment 2 Document in Chart   Basic metabolic panel     Status: Abnormal   Collection Time: 11/23/19  3:57 AM  Result Value Ref Range   Sodium 138 135 - 145 mmol/L   Potassium 4.4 3.5 - 5.1 mmol/L   Chloride 100 98 - 111 mmol/L   CO2 28 22 - 32 mmol/L   Glucose, Bld 95 70 - 99 mg/dL   BUN 20 8 - 23 mg/dL  Creatinine, Ser 1.03 (H) 0.44 - 1.00 mg/dL   Calcium 8.6 (L) 8.9 - 10.3 mg/dL   GFR calc non Af Amer 54 (L) >60 mL/min   GFR calc Af Amer >60 >60 mL/min   Anion gap 10 5 - 15  CBC with Differential/Platelet     Status: Abnormal   Collection Time: 11/23/19  3:57 AM  Result Value Ref Range   WBC 11.2 (H) 4.0 - 10.5 K/uL   RBC 2.99 (L) 3.87 - 5.11 MIL/uL   Hemoglobin 8.9 (L) 12.0 - 15.0 g/dL   HCT 28.4 (L) 36 - 46 %   MCV 95.0 80.0 - 100.0 fL   MCH 29.8 26.0 - 34.0 pg   MCHC 31.3 30.0 - 36.0 g/dL   RDW 13.3 11.5 - 15.5 %   Platelets 231 150 - 400 K/uL   nRBC 0.0 0.0 - 0.2 %   Neutrophils Relative % 68 %   Neutro Abs 7.7 1.7 - 7.7 K/uL   Lymphocytes Relative 16 %   Lymphs Abs 1.7 0.7 - 4.0 K/uL   Monocytes Relative 10 %   Monocytes Absolute 1.1 (H) 0 - 1 K/uL   Eosinophils  Relative 5 %   Eosinophils Absolute 0.6 (H) 0 - 0 K/uL   Basophils Relative 0 %   Basophils Absolute 0.0 0 - 0 K/uL   Immature Granulocytes 1 %   Abs Immature Granulocytes 0.07 0.00 - 0.07 K/uL  Magnesium     Status: None   Collection Time: 11/23/19  3:57 AM  Result Value Ref Range   Magnesium 2.1 1.7 - 2.4 mg/dL  Glucose, capillary     Status: None   Collection Time: 11/23/19  8:05 AM  Result Value Ref Range   Glucose-Capillary 95 70 - 99 mg/dL   Comment 1 Notify RN    Comment 2 Document in Chart   Glucose, capillary     Status: Abnormal   Collection Time: 11/23/19 11:46 AM  Result Value Ref Range   Glucose-Capillary 180 (H) 70 - 99 mg/dL   Comment 1 Notify RN    Comment 2 Document in Chart     Recent Results (from the past 240 hour(s))  Urine culture     Status: Abnormal   Collection Time: 11/15/19 11:03 AM   Specimen: In/Out Cath Urine  Result Value Ref Range Status   Specimen Description   Final    IN/OUT CATH URINE Performed at [facility], [address] [place], [place] [address]    Special Requests   Final    NONE Performed at [facility], [address] [place], [place] [address]    Culture >=100,000 COLONIES/mL ESCHERICHIA COLI (A)  Final   Report Status 11/18/2019 FINAL  Final   Organism ID, Bacteria ESCHERICHIA COLI (A)  Final      Susceptibility   Escherichia coli - MIC*    AMPICILLIN >=32 RESISTANT Resistant     CEFAZOLIN <=4 SENSITIVE Sensitive     CEFTRIAXONE <=0.25 SENSITIVE Sensitive     CIPROFLOXACIN <=0.25 SENSITIVE Sensitive     GENTAMICIN <=1 SENSITIVE Sensitive     IMIPENEM <=0.25 SENSITIVE Sensitive     NITROFURANTOIN <=16 SENSITIVE Sensitive     TRIMETH/SULFA <=20 SENSITIVE Sensitive     AMPICILLIN/SULBACTAM 8 SENSITIVE Sensitive     PIP/TAZO <=4 SENSITIVE Sensitive     * >=100,000 COLONIES/mL ESCHERICHIA COLI  Blood culture (routine x 2)     Status: Abnormal   Collection Time: 11/15/19  6:38 PM   Specimen: BLOOD   Result Value Ref Range Status   Specimen Description   Final    BLOOD LEFT ANTECUBITAL Performed at [facility], [place] [address], [place], [place] [address]    Special Requests   Final    BOTTLES DRAWN AEROBIC AND ANAEROBIC Blood Culture results may not be optimal due to an excessive volume of blood received in culture bottles Performed at [facility], [address] [place], [place] [address]    Culture  Setup Time   Final    Organism ID to follow [place] BOTTLES CRITICAL RESULT CALLED TO, READ BACK BY AND VERIFIED WITH: SUSAN WATSON AT 0820 ON 11/16/19 SNG Performed at [facility], [address] [place], [place] [address]    Culture STREPTOCOCCUS PNEUMONIAE (A)  Final   Report Status 11/18/2019 FINAL  Final   Organism ID, Bacteria STREPTOCOCCUS PNEUMONIAE  Final      Susceptibility   Streptococcus pneumoniae - MIC*    ERYTHROMYCIN <=0.12 SENSITIVE Sensitive     LEVOFLOXACIN 0.5 SENSITIVE Sensitive     VANCOMYCIN 0.5 SENSITIVE Sensitive     PENICILLIN (meningitis) <=0.06 SENSITIVE Sensitive     PENO - penicillin <=0.06      PENICILLIN (non-meningitis) <=0.06 SENSITIVE Sensitive     PENICILLIN (oral) <=0.06 SENSITIVE Sensitive     CEFTRIAXONE (non-meningitis) <=0.12 SENSITIVE Sensitive     CEFTRIAXONE (meningitis) <=0.12 SENSITIVE Sensitive     * STREPTOCOCCUS PNEUMONIAE  SARS Coronavirus 2 by RT PCR (hospital order, performed in [facility] lab) Nasopharyngeal Nasopharyngeal Swab     Status: None   Collection Time: 11/15/19  6:38 PM   Specimen: Nasopharyngeal Swab  Result Value Ref Range Status   SARS Coronavirus 2 NEGATIVE NEGATIVE Final    Comment: (NOTE) SARS-CoV-2 target nucleic acids are NOT DETECTED.  The SARS-CoV-2 RNA is generally detectable in upper and lower respiratory specimens during the acute phase of infection. The lowest concentration of SARS-CoV-2 viral copies this assay can  detect is 250 copies / mL. A negative result does not preclude SARS-CoV-2 infection and should not be used as the sole basis for treatment or other patient management decisions.  A negative result may occur with improper specimen collection / handling, submission of specimen other than nasopharyngeal swab, presence of viral mutation(s) within the areas targeted by this assay, and inadequate number of viral copies (<250 copies / mL). A negative result must be combined with clinical observations, patient history, and epidemiological information.  Fact Sheet for Patients:   StrictlyIdeas.no  Fact Sheet for Healthcare Providers: BankingDealers.co.za  This test is not yet approved or  cleared by the [place] FDA and has been authorized for detection and/or diagnosis of SARS-CoV-2 by FDA under an Emergency Use Authorization (EUA).  This EUA will remain in effect (meaning this test can be used) for the duration of the COVID-19 declaration under Section 564(b)(1) of the Act, 21 U.S.C. section 360bbb-3(b)(1), unless the authorization is terminated or revoked sooner.  Performed at [facility], [address] [place], [place] [address]   Blood Culture ID Panel (Reflexed)     Status: Abnormal   Collection Time: 11/15/19  6:38 PM  Result Value Ref Range Status   Enterococcus faecalis NOT DETECTED NOT DETECTED Final   Enterococcus Faecium NOT DETECTED NOT DETECTED Final   Listeria monocytogenes NOT DETECTED NOT DETECTED Final   Staphylococcus species NOT DETECTED NOT DETECTED  Final   Staphylococcus aureus (BCID) NOT DETECTED NOT DETECTED Final   Staphylococcus epidermidis NOT DETECTED NOT DETECTED Final   Staphylococcus lugdunensis NOT DETECTED NOT DETECTED Final   Streptococcus species DETECTED (A) NOT DETECTED Final    Comment: CRITICAL RESULT CALLED TO, READ BACK BY AND VERIFIED WITH: SUSAN WATSON AT 0820 ON 11/16/19 SNG     Streptococcus agalactiae NOT DETECTED NOT DETECTED Final   Streptococcus pneumoniae DETECTED (A) NOT DETECTED Final    Comment: CRITICAL RESULT CALLED TO, READ BACK BY AND VERIFIED WITH: SUSAN WATSON AT 0820 ON 11/16/19 SNG    Streptococcus pyogenes NOT DETECTED NOT DETECTED Final   A.calcoaceticus-baumannii NOT DETECTED NOT DETECTED Final   Bacteroides fragilis NOT DETECTED NOT DETECTED Final   Enterobacterales NOT DETECTED NOT DETECTED Final   Enterobacter cloacae complex NOT DETECTED NOT DETECTED Final   Escherichia coli NOT DETECTED NOT DETECTED Final   Klebsiella aerogenes NOT DETECTED NOT DETECTED Final   Klebsiella oxytoca NOT DETECTED NOT DETECTED Final   Klebsiella pneumoniae NOT DETECTED NOT DETECTED Final   Proteus species NOT DETECTED NOT DETECTED Final   Salmonella species NOT DETECTED NOT DETECTED Final   Serratia marcescens NOT DETECTED NOT DETECTED Final   Haemophilus influenzae NOT DETECTED NOT DETECTED Final   Neisseria meningitidis NOT DETECTED NOT DETECTED Final   Pseudomonas aeruginosa NOT DETECTED NOT DETECTED Final   Stenotrophomonas maltophilia NOT DETECTED NOT DETECTED Final   Candida albicans NOT DETECTED NOT DETECTED Final   Candida auris NOT DETECTED NOT DETECTED Final   Candida glabrata NOT DETECTED NOT DETECTED Final   Candida krusei NOT DETECTED NOT DETECTED Final   Candida parapsilosis NOT DETECTED NOT DETECTED Final   Candida tropicalis NOT DETECTED NOT DETECTED Final   Cryptococcus neoformans/gattii NOT DETECTED NOT DETECTED Final    Comment: Performed at [facility], [address] [place], [place] [address]  Culture, blood (Routine X 2) w Reflex to ID Panel     Status: None   Collection Time: 11/16/19 11:53 PM   Specimen: BLOOD  Result Value Ref Range Status   Specimen Description BLOOD BLOOD LEFT FOREARM  Final   Special Requests   Final    BOTTLES DRAWN AEROBIC AND ANAEROBIC Blood Culture adequate volume   Culture   Final    NO  GROWTH 5 DAYS Performed at [facility], [address] [place], [place] [address]    Report Status 11/22/2019 FINAL  Final  Culture, blood (Routine X 2) w Reflex to ID Panel     Status: None   Collection Time: 11/16/19 11:53 PM   Specimen: BLOOD  Result Value Ref Range Status   Specimen Description BLOOD BLOOD LEFT HAND  Final   Special Requests   Final    BOTTLES DRAWN AEROBIC AND ANAEROBIC Blood Culture adequate volume   Culture   Final    NO GROWTH 5 DAYS Performed at [facility], [address] [place], [place] [address]    Report Status 11/22/2019 FINAL  Final     Radiology Studies: No results found. CT MAXILLOFACIAL W & WO CONTRAST  Final Result    DG FLUORO GUIDED NEEDLE PLC ASPIRATION/INJECTION LOC  Final Result    DG Chest Port 1 View  Final Result    DG Chest Port 1 View  Final Result    CT Head Wo Contrast  Final Result      Scheduled Meds: . aspirin EC  81 mg Oral Daily  . atorvastatin  80 mg Oral QHS  . enoxaparin (LOVENOX) injection  40 mg Subcutaneous Q24H  . feeding supplement (GLUCERNA SHAKE)  237 mL Oral Q24H  . insulin aspart  0-20 Units Subcutaneous TID AC & HS  . insulin glargine  65 Units Subcutaneous Q supper  . lidocaine-prilocaine   Topical Once  . loratadine  10 mg Oral Daily  . losartan  25 mg Oral BH-q7a  . metoprolol succinate  25 mg Oral BH-q7a  . oxybutynin  10 mg Oral BH-q7a  . pantoprazole  40 mg Oral Daily  . polyethylene glycol  17 g Oral Daily  . senna-docusate  1 tablet Oral BID  . sertraline  25 mg Oral Daily  . vitamin B-12  1,000 mcg Oral Daily   PRN Meds: acetaminophen **OR** acetaminophen, aspirin-acetaminophen-caffeine, HYDROcodone-acetaminophen, ipratropium-albuterol, morphine injection, ondansetron **OR** ondansetron (ZOFRAN) IV Continuous Infusions: . cefTRIAXone (ROCEPHIN)  IV 2 g (11/23/19 1203)      LOS: 8 days  Time spent: Greater than 50% of the 35 minute visit was spent in  counseling/coordination of care for the patient as laid out in the A&P.   Dwyane Dee, MD [facility] 11/23/2019, 4:09 PM  Contact via secure chat.  To contact the attending provider between 7A-7P or the covering provider during after hours 7P-7A, please log into the web site [email or web address] and access using universal  password for that web site. If you do not have the password, please call the hospital operator.

## 2019-11-23 NOTE — Progress Notes (Signed)
Writer gave pt  full bed bath and linen change per her request

## 2019-11-23 NOTE — Progress Notes (Signed)
Occupational Therapy Treatment Patient Details Name: Andrea [name] MRN: [id number] DOB: 05-10-1945 Today's Date: 11/23/2019    History of present illness Andrea [name] is a 74 y/o female who was admitted for AMS and headache. PMH includes CAD, DM II, GERD, HLD, recurrent UTI, PNA, depression, CVA, and TIA.   OT comments  Pt received sitting in recliner, with daughter present. Pt is A&O x 4 and agreeable to therapy. Engaged in dressing and grooming tasks in sitting, with SUPV. Pt able to maintain arms raised above head w/ activity for >5 minutes w/o becoming fatigued. Provided education on home safety, EC, home exercise program, HHOT. Pt and daughter expressed understanding, although pt still displays a certain lack of awareness of safety concerns. Skilled OT services while hospitalized will increase safety, functional mobility, and minimize fall risk. HHOT and intermittent supervision will also improve safety and maximize pt's functional independence.   Follow Up Recommendations  Home health OT;Supervision - Intermittent    Equipment Recommendations       Recommendations for Other Services      Precautions / Restrictions Precautions Precautions: Fall Restrictions Weight Bearing Restrictions: No       Mobility Bed Mobility               General bed mobility comments: seated in recliner chair upon entering the room  Transfers                      Balance Overall balance assessment: Modified Independent Sitting-balance support: No upper extremity supported;Feet supported Sitting balance-Leahy Scale: Good                                     ADL either performed or assessed with clinical judgement   ADL Overall ADL's : Needs assistance/impaired     Grooming: Wash/dry hands;Wash/dry face;Brushing hair;Supervision/safety;Sitting Grooming Details (indicate cue type and reason): able to reach BUE simultaneously to head level and perform hair  care for ~ 5 minutes Upper Body Bathing: Supervision/ safety;Sitting                                   Vision       Perception     Praxis      Cognition Arousal/Alertness: Awake/alert Behavior During Therapy: WFL for tasks assessed/performed Overall Cognitive Status: Within Functional Limits for tasks assessed                                          Exercises Other Exercises Other Exercises: Educated re: POC, home safety, HEP, EC, role of HHOT   Shoulder Instructions       General Comments      Pertinent Vitals/ Pain       Pain Assessment: 0-10 Pain Score: 5  Pain Location: Headache  Home Living Family/patient expects to be discharged to:: Private residence Living Arrangements: Spouse/significant other Available Help at Discharge: Family;Available PRN/intermittently;Friend(s) Type of Home: Mobile home Home Access: Ramped entrance;Other (comment)     Home Layout: One level     Bathroom Shower/Tub: Teacher, early years/pre: Standard Bathroom Accessibility: Yes   Home Equipment: Walker - 4 wheels;Walker - 2 wheels;Tub bench;Wheelchair - manual;Toilet riser  Prior Functioning/Environment Level of Independence: Independent with assistive device(s)        Comments: Pt reports primary ambulation in manual wheelchair and is able to transfer to toilet utilizing sink. She reports no falls in last 6 months and states that her husband helps her to get baths.   Frequency  Min 1X/week        Progress Toward Goals  OT Goals(current goals can now be found in the care plan section)     Acute Rehab OT Goals Patient Stated Goal: To return home OT Goal Formulation: With patient/family Time For Goal Achievement: 12/02/19 Potential to Achieve Goals: Good  Plan      Co-evaluation                 AM-PAC OT "6 Clicks" Daily Activity     Outcome Measure   Help from another person eating meals?:  None Help from another person taking care of personal grooming?: A Little Help from another person toileting, which includes using toliet, bedpan, or urinal?: A Little Help from another person bathing (including washing, rinsing, drying)?: A Little Help from another person to put on and taking off regular upper body clothing?: None Help from another person to put on and taking off regular lower body clothing?: A Little 6 Click Score: 20    End of Session    OT Visit Diagnosis: Other abnormalities of gait and mobility (R26.89)   Activity Tolerance Patient tolerated treatment well   Patient Left in chair;with call bell/phone within reach;with family/visitor present   Nurse Communication          Time: 2482-5003 OT Time Calculation (min): 27 min  Charges: OT General Charges $OT Visit: 1 Visit OT Treatments $Self Care/Home Management : 8-22 mins $Therapeutic Activity: 8-22 mins  Josiah Lobo, PhD, [place], OTR/L ascom [phone] 11/23/19, 3:54 PM

## 2019-11-23 NOTE — Progress Notes (Signed)
Pt refusing senokot and miralax orders - states this morning was the first time she has been able to eat a full breakfast and wants to see what happens naturally before taking medication.

## 2019-11-23 NOTE — Progress Notes (Signed)
Physical Therapy Treatment Patient Details Name: Andrea [name] MRN: [id number] DOB: [birth year] Today's Date: 11/23/2019    History of Present Illness Ghada Abbett is a 74 y/o female who was admitted for AMS and headache. PMH includes CAD, DM II, GERD, HLD, recurrent UTI, PNA, depression, CVA, and TIA.    PT Comments    Pt in chair at entry, agreeable to OPT session. Reports HA well controlled this date. 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 Pt performed seated leg strengthening. Pt able to perform 10 consecutive STS transfers from chair this date without assist, a notable improvement compared to 2 days prior in which pt requires minA after 3 reps. Will continue to follow.    Follow Up Recommendations  Home health PT;Supervision for mobility/OOB     Equipment Recommendations  None recommended by PT;Other (comment)    Recommendations for Other Services       Precautions / Restrictions Precautions Precautions: Fall Restrictions Weight Bearing Restrictions: No    Mobility  Bed Mobility               General bed mobility comments: seated in recliner chair upon entering the room  Transfers     Transfers: Sit to/from Stand           General transfer comment: 1x10 this date no physical assist needed unlike 2 days prior  Ambulation/Gait                 Stairs             Wheelchair Mobility    Modified Rankin (Stroke Patients Only)       Balance Overall balance assessment: Modified Independent Sitting-balance support: No upper extremity supported;Feet supported Sitting balance-Leahy Scale: Good                                      Cognition Arousal/Alertness: Awake/alert Behavior During Therapy: WFL for tasks assessed/performed Overall Cognitive Status: Within Functional Limits for tasks assessed                                        Exercises General  Exercises - Lower Extremity Long Arc Quad: AROM;15 reps;Seated;Both Hip Flexion/Marching: AROM;AAROM;Seated;Both;15 reps Heel Raises: AROM;Seated;Both;20 reps Other Exercises Other Exercises: Educated re: POC, home safety, HEP, EC, role of HHOT    General Comments        Pertinent Vitals/Pain Pain Assessment: No/denies pain Pain Score: 5  Pain Location: Headache    Home Living Family/patient expects to be discharged to:: Private residence Living Arrangements: Spouse/significant other Available Help at Discharge: Family;Available PRN/intermittently;Friend(s) Type of Home: Mobile home Home Access: Ramped entrance;Other (comment)   Home Layout: One level Home Equipment: [place] - 4 wheels;Walker - 2 wheels;Tub bench;Wheelchair - manual;Toilet riser      Prior Function Level of Independence: Independent with assistive device(s)      Comments: Pt reports primary ambulation in manual wheelchair and is able to transfer to toilet utilizing sink. She reports no falls in last 6 months and states that her husband helps her to get baths.   PT Goals (current goals can now be found in the care plan section) Acute Rehab PT Goals Patient Stated Goal: To return home PT Goal Formulation: With patient Time For Goal Achievement: 12/01/19  Potential to Achieve Goals: Good Additional Goals Additional Goal #1: Pt will perform sit <> stand and stand pivot sit transfers with mod I and LRAD. Progress towards PT goals: Progressing toward goals    Frequency    Min 2X/week      PT Plan Current plan remains appropriate    Co-evaluation              AM-PAC PT "6 Clicks" Mobility   Outcome Measure  Help needed turning from your back to your side while in a flat bed without using bedrails?: A Little Help needed moving from lying on your back to sitting on the side of a flat bed without using bedrails?: A Lot Help needed moving to and from a bed to a chair (including a wheelchair)?: A  Little Help needed standing up from a chair using your arms (e.g., wheelchair or bedside chair)?: A Little Help needed to walk in hospital room?: A Little Help needed climbing 3-5 steps with a railing? : A Lot 6 Click Score: 16    End of Session Equipment Utilized During Treatment: Gait belt;Oxygen Activity Tolerance: Patient tolerated treatment well;No increased pain Patient left: in chair;with call bell/phone within reach;with chair alarm set;with family/visitor present Nurse Communication: Mobility status;Other (comment) PT Visit Diagnosis: Unsteadiness on feet (R26.81);Other abnormalities of gait and mobility (R26.89);Muscle weakness (generalized) (M62.81)     Time: 6503-5465 PT Time Calculation (min) (ACUTE ONLY): 18 min  Charges:  $Therapeutic Exercise: 8-22 mins                     4:18 PM, 11/23/19 Etta Grandchild, PT, DPT Physical Therapist - [facility]  [phone] ([place])    Jeanann Balinski C 11/23/2019, 4:16 PM

## 2019-11-24 ENCOUNTER — Ambulatory Visit (HOSPITAL_COMMUNITY)
Admission: AD | Admit: 2019-11-24 | Discharge: 2019-11-24 | Disposition: A | Discharge status: Home / Self Care | Payer: Medicare Other | Source: Other Acute Inpatient Hospital | Attending: Internal Medicine | Admitting: Internal Medicine

## 2019-11-24 ENCOUNTER — Inpatient Hospital Stay: Payer: Medicare Other

## 2019-11-24 DIAGNOSIS — Q019 Encephalocele, unspecified: Secondary | ICD-10-CM | POA: Diagnosis not present

## 2019-11-24 DIAGNOSIS — E1165 Type 2 diabetes mellitus with hyperglycemia: Secondary | ICD-10-CM | POA: Insufficient documentation

## 2019-11-24 DIAGNOSIS — I110 Hypertensive heart disease with heart failure: Secondary | ICD-10-CM | POA: Diagnosis not present

## 2019-11-24 DIAGNOSIS — J9690 Respiratory failure, unspecified, unspecified whether with hypoxia or hypercapnia: Secondary | ICD-10-CM | POA: Diagnosis not present

## 2019-11-24 DIAGNOSIS — I5032 Chronic diastolic (congestive) heart failure: Secondary | ICD-10-CM | POA: Diagnosis not present

## 2019-11-24 DIAGNOSIS — G039 Meningitis, unspecified: Secondary | ICD-10-CM | POA: Diagnosis not present

## 2019-11-24 DIAGNOSIS — M810 Age-related osteoporosis without current pathological fracture: Secondary | ICD-10-CM | POA: Diagnosis not present

## 2019-11-24 DIAGNOSIS — E1142 Type 2 diabetes mellitus with diabetic polyneuropathy: Secondary | ICD-10-CM | POA: Diagnosis not present

## 2019-11-24 DIAGNOSIS — G9601 Cranial cerebrospinal fluid leak, spontaneous: Secondary | ICD-10-CM | POA: Diagnosis not present

## 2019-11-24 DIAGNOSIS — N390 Urinary tract infection, site not specified: Secondary | ICD-10-CM | POA: Diagnosis not present

## 2019-11-24 DIAGNOSIS — Z794 Long term (current) use of insulin: Secondary | ICD-10-CM | POA: Diagnosis not present

## 2019-11-24 DIAGNOSIS — J9601 Acute respiratory failure with hypoxia: Secondary | ICD-10-CM

## 2019-11-24 DIAGNOSIS — A409 Streptococcal sepsis, unspecified: Secondary | ICD-10-CM | POA: Diagnosis not present

## 2019-11-24 DIAGNOSIS — R0989 Other specified symptoms and signs involving the circulatory and respiratory systems: Secondary | ICD-10-CM | POA: Diagnosis not present

## 2019-11-24 DIAGNOSIS — J189 Pneumonia, unspecified organism: Secondary | ICD-10-CM

## 2019-11-24 DIAGNOSIS — Z882 Allergy status to sulfonamides status: Secondary | ICD-10-CM | POA: Diagnosis not present

## 2019-11-24 DIAGNOSIS — J811 Chronic pulmonary edema: Secondary | ICD-10-CM | POA: Diagnosis not present

## 2019-11-24 DIAGNOSIS — E118 Type 2 diabetes mellitus with unspecified complications: Secondary | ICD-10-CM | POA: Diagnosis not present

## 2019-11-24 DIAGNOSIS — R638 Other symptoms and signs concerning food and fluid intake: Secondary | ICD-10-CM | POA: Diagnosis not present

## 2019-11-24 DIAGNOSIS — E119 Type 2 diabetes mellitus without complications: Secondary | ICD-10-CM | POA: Diagnosis not present

## 2019-11-24 DIAGNOSIS — J449 Chronic obstructive pulmonary disease, unspecified: Secondary | ICD-10-CM | POA: Diagnosis not present

## 2019-11-24 DIAGNOSIS — Z87891 Personal history of nicotine dependence: Secondary | ICD-10-CM | POA: Diagnosis not present

## 2019-11-24 DIAGNOSIS — Z7982 Long term (current) use of aspirin: Secondary | ICD-10-CM | POA: Diagnosis not present

## 2019-11-24 DIAGNOSIS — Z79891 Long term (current) use of opiate analgesic: Secondary | ICD-10-CM | POA: Diagnosis not present

## 2019-11-24 DIAGNOSIS — R918 Other nonspecific abnormal finding of lung field: Secondary | ICD-10-CM | POA: Diagnosis not present

## 2019-11-24 DIAGNOSIS — I2510 Atherosclerotic heart disease of native coronary artery without angina pectoris: Secondary | ICD-10-CM | POA: Diagnosis not present

## 2019-11-24 DIAGNOSIS — Z7984 Long term (current) use of oral hypoglycemic drugs: Secondary | ICD-10-CM | POA: Diagnosis not present

## 2019-11-24 DIAGNOSIS — G8929 Other chronic pain: Secondary | ICD-10-CM | POA: Diagnosis not present

## 2019-11-24 DIAGNOSIS — R7881 Bacteremia: Secondary | ICD-10-CM | POA: Diagnosis not present

## 2019-11-24 DIAGNOSIS — G9600 Cerebrospinal fluid leak, unspecified: Secondary | ICD-10-CM | POA: Diagnosis not present

## 2019-11-24 DIAGNOSIS — J440 Chronic obstructive pulmonary disease with acute lower respiratory infection: Secondary | ICD-10-CM | POA: Diagnosis not present

## 2019-11-24 DIAGNOSIS — M48061 Spinal stenosis, lumbar region without neurogenic claudication: Secondary | ICD-10-CM | POA: Diagnosis not present

## 2019-11-24 DIAGNOSIS — Z792 Long term (current) use of antibiotics: Secondary | ICD-10-CM | POA: Diagnosis not present

## 2019-11-24 DIAGNOSIS — J810 Acute pulmonary edema: Secondary | ICD-10-CM | POA: Diagnosis not present

## 2019-11-24 DIAGNOSIS — I252 Old myocardial infarction: Secondary | ICD-10-CM | POA: Diagnosis not present

## 2019-11-24 LAB — BASIC METABOLIC PANEL
Anion gap: 10 (ref 5–15)
BUN: 23 mg/dL (ref 8–23)
CO2: 28 mmol/L (ref 22–32)
Calcium: 8.8 mg/dL — ABNORMAL LOW (ref 8.9–10.3)
Chloride: 100 mmol/L (ref 98–111)
Creatinine, Ser: 1.02 mg/dL — ABNORMAL HIGH (ref 0.44–1.00)
GFR calc Af Amer: >60 mL/min (ref >60)
GFR calc non Af Amer: 54 mL/min — ABNORMAL LOW (ref >60)
Glucose, Bld: 197 mg/dL — ABNORMAL HIGH (ref 70–99)
Potassium: 4.1 mmol/L (ref 3.5–5.1)
Sodium: 138 mmol/L (ref 135–145)

## 2019-11-24 LAB — GLUCOSE, CAPILLARY
Glucose-Capillary: 154 mg/dL — ABNORMAL HIGH (ref 70–99)
Glucose-Capillary: 310 mg/dL — ABNORMAL HIGH (ref 70–99)
Glucose-Capillary: 368 mg/dL — ABNORMAL HIGH (ref 70–99)

## 2019-11-24 LAB — CBC WITH DIFFERENTIAL/PLATELET
Abs Immature Granulocytes: 0.12 10*3/uL — ABNORMAL HIGH (ref 0.00–0.07)
Basophils Absolute: 0 10*3/uL (ref 0.0–0.1)
Basophils Relative: 0 %
Eosinophils Absolute: 0.6 10*3/uL — ABNORMAL HIGH (ref 0.0–0.5)
Eosinophils Relative: 6 %
HCT: 29.1 % — ABNORMAL LOW (ref 36.0–46.0)
Hemoglobin: 9.8 g/dL — ABNORMAL LOW (ref 12.0–15.0)
Immature Granulocytes: 1 %
Lymphocytes Relative: 19 %
Lymphs Abs: 1.9 10*3/uL (ref 0.7–4.0)
MCH: 30.4 pg (ref 26.0–34.0)
MCHC: 33.7 g/dL (ref 30.0–36.0)
MCV: 90.4 fL (ref 80.0–100.0)
Monocytes Absolute: 0.9 10*3/uL (ref 0.1–1.0)
Monocytes Relative: 9 %
Neutro Abs: 6.6 10*3/uL (ref 1.7–7.7)
Neutrophils Relative %: 65 %
Platelets: 289 10*3/uL (ref 150–400)
RBC: 3.22 MIL/uL — ABNORMAL LOW (ref 3.87–5.11)
RDW: 13.3 % (ref 11.5–15.5)
WBC: 10.1 10*3/uL (ref 4.0–10.5)
nRBC: 0 % (ref 0.0–0.2)

## 2019-11-24 LAB — MAGNESIUM: Magnesium: 2.2 mg/dL (ref 1.7–2.4)

## 2019-11-24 LAB — PROCALCITONIN: Procalcitonin: 0.17 ng/mL

## 2019-11-24 MED ORDER — FUROSEMIDE 10 MG/ML IJ SOLN
40.0000 mg | Freq: Once | INTRAMUSCULAR | Status: AC
  Administered 2019-11-24: 40 mg via INTRAVENOUS
  Filled 2019-11-24: qty 4

## 2019-11-24 NOTE — Progress Notes (Signed)
PROGRESS NOTE    Andrea [name]   CZY:[id number]  DOB: 1946-01-08  DOA: 11/15/2019     9  PCP: Rubye Beach  CC: fever, AMS  Hospital Course: [name] is a 74 year old female with CAD, DM 2 and recurrent UTI and pneumonia who was admitted 11/15/2019 with fever, leukocytosis and altered mental status.  Patient was started on broad-spectrum antibiotics to cover treatment of meningitis although patient refused lumbar puncture.  She was treated with cefepime, vancomycin, ampicillin, metronidazole and acyclovir.  Blood cultures became positive for strep pneumonia with no clear source (initially) as chest x-ray was negative initially.   Antibiotics were deescalated to vancomycin and ceftriaxone.  Patient improved clinically with resumption of normal mental status however did have lingering headache (6/10).  She was seen by ID who recommended continue ceftriaxone for 14 days until MIC of pneumococcus has returned. Once sensitivies returned she was de-escalated further to PCN.  Vancomycin was also discontinued.  On 11/19/19 she developed a clear nasal discharge from her left nare that appeared more CSF in consistency than mucous. A beta-2 transferrin was sent for testing.   On 11/20/19 she was to undergo TEE however developed worsening cough/SOB. She then underwent a CXR which showed bilateral upper airspace opacities concerning for PNA.   She also underwent a CT cisternogram and after serial imagining she was considered to have a CSF leak per radiology read (beta-2 transferrin still pending).  Her case was discussed with [facility] (Dr. Dub Mikes) for transfer. She was accepted for transfer pending bed availability and placed on transfer wait list (have spoke with Dr. Etta Quill with hospitalist team).  In the meantime she was continued on antibiotics for treatment of her underlying infections (Strep pna bacteremia, presumed meningitis, and PNA).    Interval History:  No acute events  overnight.  Daughter, [name] is bedside this morning.  We had a thorough discussion regarding next steps.  For now, continue treatment for meningitis/bacteremia/pneumonia. CXR was repeated this morning and shows some worsening however clinically she has been improving and feeling okay.  Encouraged her to continue working on incentive spirometer and getting out of bed with physical therapy.  Old records reviewed in assessment of this patient  ROS: Constitutional: negative for chills and fevers, Respiratory: positive for cough, Cardiovascular: negative for chest pain and Gastrointestinal: negative for abdominal pain  Assessment & Plan: Sepsis -resolved Strep pna bacteremia Presumed meningitis 2/2 Strep pna (no LP performed on admission) Acute metabolic encephalopathy - resolved Acute hypoxic respiratory failure CAP - clinically has improved with ongoing downtrend in WBC, stable vitals, and afebrile - continue abx - wean O2 as able, currently 2L. 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 Dub Mikes) and surgeon Dr. Dimple Nanas. Currently patient on wait list for transfer to hospitalist  team (Dr. Etta Quill was accepting physician with hospitalist team, but bed status may still take days); spoke again with transfer center on 9/17 in the afternoon (still on list and  still awaiting a bed)  Headache This may be secondary to resolving meningitis - continue supportive care - if worsens will consider repeat imagining but just had CT cisternogram on 9/14 as well  DM2 - continue Lantus; adjust as needed - continue SSI and CBGs  Chronic LBP/hip pain with spinal stenosis Continue Norco  COPD No evidence for acute flare Continue O2 at home doses As needed inhaled bronchodilators  CAD No chest pain, continue metoprolol, losartan, aspirin and statin  Antimicrobials: Ampicillin 9/9>>9/10 Cefepime 9/10 x 1 Rocephin 9/9>>9/13 Flagyl 9/10 x 2 Vanc 9/9 >> 9/12 PCN G 9/13 >> 9/15 Rocephin 9/15>> current    DVT prophylaxis: Lovenox Code Status: Full Family Communication: Daughters Disposition Plan: Status is: Inpatient  Remains inpatient appropriate because:Ongoing diagnostic testing needed not appropriate for outpatient work up, Unsafe d/c plan, IV treatments appropriate due to intensity of illness or inability to take PO and Inpatient level of care appropriate due to severity of illness   Dispo: The patient is from: Home              Anticipated d/c is to: transfer to [facility] when bed available              Anticipated d/c date is: unknown; awaiting a bed              Patient currently is not medically stable to d/c.  Objective: Blood pressure (!) 146/90, pulse 93, temperature 98.1 F (36.7 C), temperature source Oral, resp. rate 16, height 5' (1.524 m), weight 72.8 kg, SpO2 96 %.  Examination: General appearance: Mentation still good and normal. Remains awake and AOx4.  Still has 5/10 HA but stable. Still has nasal drip Head: Normocephalic, without obvious abnormality, atraumatic Eyes: EOMI Lungs: coarse breath sounds bilaterally Heart: regular rate and rhythm and S1, S2 normal Abdomen: normal findings: bowel sounds normal and soft, non-tender Extremities: no edema Skin: Skin abrasion noted on left wrist covered with dressing.  Senile  purpura noted throughout and skin is very soft and fragile throughout Neurologic: Grossly normal  Consultants:   ID  Neurosurgery  Procedures:   n/a  Data Reviewed: I have personally reviewed following labs and imaging studies Results for orders placed or performed during the hospital encounter of 11/15/19 (from the past 24 hour(s))  Glucose, capillary     Status: Abnormal   Collection Time: 11/23/19  4:49 PM  Result Value Ref Range   Glucose-Capillary 335 (H) 70 - 99 mg/dL   Comment 1 Notify RN    Comment 2 Document in Chart   Glucose, capillary     Status: Abnormal   Collection Time: 11/23/19  8:50 PM  Result Value Ref Range   Glucose-Capillary 392 (H) 70 - 99 mg/dL  Basic metabolic panel     Status: Abnormal   Collection Time: 11/24/19  4:55 AM  Result Value Ref Range   Sodium 138 135 - 145 mmol/L   Potassium 4.1 3.5 - 5.1 mmol/L   Chloride 100 98 - 111 mmol/L   CO2 28 22 - 32 mmol/L   Glucose, Bld 197 (H) 70 - 99 mg/dL   BUN 23 8 - 23 mg/dL   Creatinine, Ser 1.02 (H) 0.44 - 1.00 mg/dL   Calcium 8.8 (L) 8.9 - 10.3 mg/dL   GFR calc non Af  Amer 54 (L) >60 mL/min   GFR calc Af Amer >60 >60 mL/min   Anion gap 10 5 - 15  CBC with Differential/Platelet     Status: Abnormal   Collection Time: 11/24/19  4:55 AM  Result Value Ref Range   WBC 10.1 4.0 - 10.5 K/uL   RBC 3.22 (L) 3.87 - 5.11 MIL/uL   Hemoglobin 9.8 (L) 12.0 - 15.0 g/dL   HCT 29.1 (L) 36 - 46 %   MCV 90.4 80.0 - 100.0 fL   MCH 30.4 26.0 - 34.0 pg   MCHC 33.7 30.0 - 36.0 g/dL   RDW 13.3 11.5 - 15.5 %   Platelets 289 150 - 400 K/uL   nRBC 0.0 0.0 - 0.2 %   Neutrophils Relative % 65 %   Neutro Abs 6.6 1.7 - 7.7 K/uL   Lymphocytes Relative 19 %   Lymphs Abs 1.9 0.7 - 4.0 K/uL   Monocytes Relative 9 %   Monocytes Absolute 0.9 0 - 1 K/uL   Eosinophils Relative 6 %   Eosinophils Absolute 0.6 (H) 0 - 0 K/uL   Basophils Relative 0 %   Basophils Absolute 0.0 0 - 0 K/uL   Immature Granulocytes 1 %   Abs  Immature Granulocytes 0.12 (H) 0.00 - 0.07 K/uL  Magnesium     Status: None   Collection Time: 11/24/19  4:55 AM  Result Value Ref Range   Magnesium 2.2 1.7 - 2.4 mg/dL  Glucose, capillary     Status: Abnormal   Collection Time: 11/24/19  7:40 AM  Result Value Ref Range   Glucose-Capillary 154 (H) 70 - 99 mg/dL   Comment 1 Notify RN    Comment 2 Document in Chart   Glucose, capillary     Status: Abnormal   Collection Time: 11/24/19 11:53 AM  Result Value Ref Range   Glucose-Capillary 310 (H) 70 - 99 mg/dL   Comment 1 Notify RN    Comment 2 Document in Chart     Recent Results (from the past 240 hour(s))  Urine culture     Status: Abnormal   Collection Time: 11/15/19 11:03 AM   Specimen: In/Out Cath Urine  Result Value Ref Range Status   Specimen Description   Final    IN/OUT CATH URINE Performed at [facility], [address] [place], [place] [address]    Special Requests   Final    NONE Performed at [facility], [address] [place],  [address]    Culture >=100,000 COLONIES/mL ESCHERICHIA COLI (A)  Final   Report Status 11/18/2019 FINAL  Final   Organism ID, Bacteria ESCHERICHIA COLI (A)  Final      Susceptibility   Escherichia coli - MIC*    AMPICILLIN >=32 RESISTANT Resistant     CEFAZOLIN <=4 SENSITIVE Sensitive     CEFTRIAXONE <=0.25 SENSITIVE Sensitive     CIPROFLOXACIN <=0.25 SENSITIVE Sensitive     GENTAMICIN <=1 SENSITIVE Sensitive     IMIPENEM <=0.25 SENSITIVE Sensitive     NITROFURANTOIN <=16 SENSITIVE Sensitive     TRIMETH/SULFA <=20 SENSITIVE Sensitive     AMPICILLIN/SULBACTAM 8 SENSITIVE Sensitive     PIP/TAZO <=4 SENSITIVE Sensitive     * >=100,000 COLONIES/mL ESCHERICHIA COLI  Blood culture (routine x 2)     Status: Abnormal   Collection Time: 11/15/19  6:38 PM   Specimen: BLOOD  Result Value Ref Range Status   Specimen Description   Final    BLOOD  LEFT ANTECUBITAL Performed at [facility], [place] [address] [place], [place] [address]    Special Requests   Final    BOTTLES DRAWN AEROBIC AND ANAEROBIC Blood Culture results may not be optimal due to an excessive volume of blood received in culture bottles Performed at [facility], [address] [place], [place] [address]    Culture  Setup Time   Final    Organism ID to follow [place] BOTTLES CRITICAL RESULT CALLED TO, READ BACK BY AND VERIFIED WITH: SUSAN WATSON AT 0820 ON 11/16/19 SNG Performed at [facility], [address] [place], [place] [address]    Culture STREPTOCOCCUS PNEUMONIAE (A)  Final   Report Status 11/18/2019 FINAL  Final   Organism ID, Bacteria STREPTOCOCCUS PNEUMONIAE  Final      Susceptibility   Streptococcus pneumoniae - MIC*    ERYTHROMYCIN <=0.12 SENSITIVE Sensitive     LEVOFLOXACIN 0.5 SENSITIVE Sensitive     VANCOMYCIN 0.5 SENSITIVE Sensitive     PENICILLIN (meningitis) <=0.06 SENSITIVE Sensitive     PENO - penicillin <=0.06      PENICILLIN (non-meningitis) <=0.06 SENSITIVE Sensitive     PENICILLIN (oral) <=0.06 SENSITIVE Sensitive     CEFTRIAXONE (non-meningitis) <=0.12 SENSITIVE Sensitive     CEFTRIAXONE (meningitis) <=0.12 SENSITIVE Sensitive     * STREPTOCOCCUS PNEUMONIAE  SARS Coronavirus 2 by RT PCR (hospital order, performed in [place] hospital lab) Nasopharyngeal Nasopharyngeal Swab     Status: None   Collection Time: 11/15/19  6:38 PM   Specimen: Nasopharyngeal Swab  Result Value Ref Range Status   SARS Coronavirus 2 NEGATIVE NEGATIVE Final    Comment: (NOTE) SARS-CoV-2 target nucleic acids are NOT DETECTED.  The SARS-CoV-2 RNA is generally detectable in upper and lower respiratory specimens during the acute phase of infection. The lowest concentration of SARS-CoV-2 viral copies this assay can detect is 250 copies / mL. A negative result does not preclude SARS-CoV-2 infection and should not be used as the sole basis for treatment or  other patient management decisions.  A negative result may occur with improper specimen collection / handling, submission of specimen other than nasopharyngeal swab, presence of viral mutation(s) within the areas targeted by this assay, and inadequate number of viral copies (<250 copies / mL). A negative result must be combined with clinical observations, patient history, and epidemiological information.  Fact Sheet for Patients:   StrictlyIdeas.no  Fact Sheet for Healthcare Providers: BankingDealers.co.za  This test is not yet approved or  cleared by the [place] FDA and has been authorized for detection and/or diagnosis of SARS-CoV-2 by FDA under an Emergency Use Authorization (EUA).  This EUA will remain in effect (meaning this test can be used) for the duration of the COVID-19 declaration under Section 564(b)(1) of the Act, 21 U.S.C. section 360bbb-3(b)(1), unless the authorization is terminated or revoked sooner.  Performed at [facility], [address] [place], [place] [address]   Blood Culture ID Panel (Reflexed)     Status: Abnormal   Collection Time: 11/15/19  6:38 PM  Result Value Ref Range Status   Enterococcus faecalis NOT DETECTED NOT DETECTED Final   Enterococcus Faecium NOT DETECTED NOT DETECTED Final   Listeria monocytogenes NOT DETECTED NOT DETECTED Final   Staphylococcus species NOT DETECTED NOT DETECTED Final   Staphylococcus aureus (BCID) NOT DETECTED NOT DETECTED Final   Staphylococcus epidermidis NOT DETECTED NOT DETECTED Final   Staphylococcus  lugdunensis NOT DETECTED NOT DETECTED Final   Streptococcus species DETECTED (A) NOT DETECTED Final    Comment: CRITICAL RESULT CALLED TO, READ BACK BY AND VERIFIED WITH: SUSAN WATSON AT 0820 ON 11/16/19 SNG    Streptococcus agalactiae NOT DETECTED NOT DETECTED Final   Streptococcus pneumoniae DETECTED (A) NOT DETECTED Final    Comment: CRITICAL RESULT  CALLED TO, READ BACK BY AND VERIFIED WITH: SUSAN WATSON AT 0820 ON 11/16/19 SNG    Streptococcus pyogenes NOT DETECTED NOT DETECTED Final   A.calcoaceticus-baumannii NOT DETECTED NOT DETECTED Final   Bacteroides fragilis NOT DETECTED NOT DETECTED Final   Enterobacterales NOT DETECTED NOT DETECTED Final   Enterobacter cloacae complex NOT DETECTED NOT DETECTED Final   Escherichia coli NOT DETECTED NOT DETECTED Final   Klebsiella aerogenes NOT DETECTED NOT DETECTED Final   Klebsiella oxytoca NOT DETECTED NOT DETECTED Final   Klebsiella pneumoniae NOT DETECTED NOT DETECTED Final   Proteus species NOT DETECTED NOT DETECTED Final   Salmonella species NOT DETECTED NOT DETECTED Final   Serratia marcescens NOT DETECTED NOT DETECTED Final   Haemophilus influenzae NOT DETECTED NOT DETECTED Final   Neisseria meningitidis NOT DETECTED NOT DETECTED Final   Pseudomonas aeruginosa NOT DETECTED NOT DETECTED Final   Stenotrophomonas maltophilia NOT DETECTED NOT DETECTED Final   Candida albicans NOT DETECTED NOT DETECTED Final   Candida auris NOT DETECTED NOT DETECTED Final   Candida glabrata NOT DETECTED NOT DETECTED Final   Candida krusei NOT DETECTED NOT DETECTED Final   Candida parapsilosis NOT DETECTED NOT DETECTED Final   Candida tropicalis NOT DETECTED NOT DETECTED Final   Cryptococcus neoformans/gattii NOT DETECTED NOT DETECTED Final    Comment: Performed at [facility], [address] [place], [place] [address]  Culture, blood (Routine X 2) w Reflex to ID Panel     Status: None   Collection Time: 11/16/19 11:53 PM   Specimen: BLOOD  Result Value Ref Range Status   Specimen Description BLOOD BLOOD LEFT FOREARM  Final   Special Requests   Final    BOTTLES DRAWN AEROBIC AND ANAEROBIC Blood Culture adequate volume   Culture   Final    NO GROWTH 5 DAYS Performed at [facility], [address] [place], [place] [address]    Report Status 11/22/2019 FINAL  Final  Culture,  blood (Routine X 2) w Reflex to ID Panel     Status: None   Collection Time: 11/16/19 11:53 PM   Specimen: BLOOD  Result Value Ref Range Status   Specimen Description BLOOD BLOOD LEFT HAND  Final   Special Requests   Final    BOTTLES DRAWN AEROBIC AND ANAEROBIC Blood Culture adequate volume   Culture   Final    NO GROWTH 5 DAYS Performed at [facility], [address], [place], [place] [address]    Report Status 11/22/2019 FINAL  Final     Radiology Studies: [facility] Chest Port 1 View  Result Date: 11/24/2019 CLINICAL DATA:  Hypoxia.  Acute mental status change. EXAM: PORTABLE CHEST 1 VIEW COMPARISON:  November 20, 2019 FINDINGS: Worsening of diffuse bilateral patchy pulmonary infiltrates. No pneumothorax. The cardiomediastinal silhouette is stable. IMPRESSION: Significant interval worsening of bilateral diffuse patchy pulmonary infiltrates most consistent with multifocal pneumonia. Recommend short-term follow-up imaging to ensure resolution. Electronically Signed   By: Dorise Bullion III M.D   On: 11/24/2019 10:58   DG Chest Port 1 View  Final Result    CT MAXILLOFACIAL W & WO CONTRAST  Final Result    DG FLUORO GUIDED NEEDLE PLC ASPIRATION/INJECTION LOC  Final Result    DG Chest Port 1 View  Final Result    DG Chest Port 1 View  Final Result    CT Head Wo Contrast  Final Result      Scheduled Meds: . aspirin EC  81 mg Oral Daily  . atorvastatin  80 mg Oral QHS  . enoxaparin (LOVENOX) injection  40 mg Subcutaneous Q24H  . feeding supplement (GLUCERNA SHAKE)  237 mL Oral Q24H  . insulin aspart  0-20 Units Subcutaneous TID AC & HS  . insulin glargine  65 Units Subcutaneous Q supper  . lidocaine-prilocaine   Topical Once  . loratadine  10 mg Oral Daily  . losartan  25 mg Oral BH-q7a  . metoprolol succinate  25 mg Oral BH-q7a  . oxybutynin  10 mg Oral BH-q7a  . pantoprazole  40 mg Oral Daily  . polyethylene glycol  17 g Oral Daily  . senna-docusate  1 tablet  Oral BID  . sertraline  25 mg Oral Daily  . vitamin B-12  1,000 mcg Oral Daily   PRN Meds: acetaminophen **OR** acetaminophen, aspirin-acetaminophen-caffeine, HYDROcodone-acetaminophen, ipratropium-albuterol, morphine injection, ondansetron **OR** ondansetron (ZOFRAN) IV Continuous Infusions: . cefTRIAXone (ROCEPHIN)  IV 2 g (11/24/19 0810)      LOS: 9 days  Time spent: Greater than 50% of the 35 minute visit was spent in counseling/coordination of care for the patient as laid out in the A&P.   Dwyane Dee, MD [facility] 11/24/2019, 2:30 PM  Contact via secure chat.  To contact the attending provider between 7A-7P or the covering provider during after hours 7P-7A, please log into the web site [email or web address] and access using universal [place] password for that web site. If you do not have the password, please call the hospital operator.

## 2019-11-24 NOTE — Progress Notes (Signed)
Patient transferring to [facility]. Report called to [facility] at [phone]. [facility] called for transport, will call back when they get ready to come transport her.

## 2019-11-24 NOTE — Discharge Summary (Signed)
Physician Discharge Summary  KAYTLYN DIN ACZ:[id number] DOB: Dec 23, 1945 DOA: 11/15/2019  PCP: Mar Daring, PA-C  Admit date: 11/15/2019 Discharge date: 11/24/2019  Admitted From: Home Disposition:  Transfer to [place] physician: Dr. Jonelle Sidle Discharging physician: Dwyane Dee, MD  Recommendations for Outpatient Follow-up:  1. Transferred to [facility] for ENT evaluation; further plan of care pending evaluation   Patient discharged to [facility] in Discharge Condition: stable CODE STATUS: Full Diet recommendation:  Diet Orders (From admission, onward)    Start     Ordered   11/24/19 0000  Diet Carb Modified        11/24/19 1633   11/20/19 1419  Diet heart healthy/carb modified Room service appropriate? Yes; Fluid consistency: Thin  Diet effective now       Question Answer Comment  Diet-HS Snack? Nothing   Room service appropriate? Yes   Fluid consistency: Thin      11/20/19 1418          Hospital Course: Ms. Sandberg is a 74 year old female with CAD, DM 2, recurrent UTI and pneumonia who was admitted 11/15/2019 with fever, leukocytosis (21.5k), and altered mental status.  Patient was started on broad-spectrum antibiotics to cover treatment of meningitis although patient refused lumbar puncture on admission.  She was treated with cefepime, vancomycin, ampicillin, metronidazole and acyclovir.   Due to high suspicion for meningitis despite refusal of LP she was continued on treatment. Blood cultures became positive for strep pna and her abx were slowly de-escalated.  Antibiotics were deescalated to vancomycin and ceftriaxone.  She  improved clinically with resumption of normal mental status however did have lingering headache (6/10 most days, needing morphine for relief).  She was seen by ID who recommended continuing ceftriaxone for 14 days until MIC of pneumococcus returned. Once sensitivies returned she was de-escalated to PCN however, once CSF leak was discovered she was broadened  back to Rocephin (see A&P below for more specific details).   On 11/19/19 she developed a clear nasal discharge from her left nare that appeared more CSF in consistency than mucous. A beta-2 transferrin was sent for testing and ultimately returned positive.   On 11/20/19 she was to undergo TEE however developed worsening cough/SOB. She then underwent a CXR which showed bilateral upper airspace opacities concerning for PNA. Clinically she did improve from this perspective (down trending WBC, remained afebrile, improving O2 status-down to 2L, no significant cough nor SOB). However, her CXR was repeated to re-evaluate on 9/18 and did show increase in opacities but as noted, she had no clinical deterioration. She was given a dose of IV lasix on 9/18 as well in case of some superimposed volume overload. Incentive spirometry was also ordered and encourarged; she likely has some atelectasis as well given prolonged hospitalization and minimal activity.  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  She also underwent a CT cisternogram on 9/14 to confirm the leak.   Her case was discussed at length with multiple transfer facilities, the patient, and her daughters. She is accepted by Dr. Raechel Ache at [facility] for transfer and further evaluation to decide on next course of action in regards to repairing her CSF leak.  Patient and daughters were informed of transfer acceptance and in agreement.   Sepsis -resolved Strep pna bacteremia Presumed meningitis 2/2 Strep pna (no LP performed on admission) Acute metabolic encephalopathy - resolved Acute hypoxic respiratory  failure CAP - clinically has improved with ongoing downtrend in WBC (now normalized), stable vitals, and afebrile - wean O2 as able, currently 2L. CXR worse on 9/18 but clinically doesn't correlate; will give trial of lasix today in case  superimposed edema; also use IS and needs to get OOB - patient being transferred to [facility]; consider trending procalcitonin  - given PNA being noted on CXR and CSF leak noted on CT and with known culture data, it is now presumed that her bacteremia and presumed meningitis is all attributed to the Strep pna; therefore TEE can be cancelled (as per ID also); if respiratory status does worsen, she may need broadening of antibiotics back out further (and/or sputum sample but currently has had no cough)  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 Raechel Ache (also discussed at length with Dr. Hoover Brunette), greatly appreciate assistance in the care for this patient   Headache This may be secondary to resolving meningitis - continue supportive care - if worsens will consider repeat imagining but just had CT cisternogram on 9/14 as well  DM2 - continue Lantus; adjust as needed - continue SSI and CBGs  Chronic LBP/hip pain with spinal stenosis Continue Norco  COPD No evidence for acute flare Continue O2 at home doses As needed inhaled bronchodilators  CAD No chest pain, continue metoprolol, losartan, aspirin and statin  Antimicrobials: Ampicillin 9/9>>9/10 Cefepime 9/10 x 1 Rocephin 9/9>>9/13 Flagyl 9/10 x 2 Vanc 9/9 >> 9/12 PCN G 9/13 >> 9/15 Rocephin 9/15>> current   Discharge Diagnoses:   Principal Diagnosis: Sepsis [facility])  Active Hospital Problems   Diagnosis Date Noted  . Meningitis 11/23/2019    Priority: High  . 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 Acute respiratory failure with hypoxia ([place]) 11/23/2019    Priority: Medium  . CAP (community acquired pneumonia) 11/23/2019    Priority: Low  . Coronary artery disease of native  artery of native heart with stable angina pectoris ([place]) 01/24/2017  . Spinal stenosis of lumbar region 01/06/2017  . Chronic obstructive pulmonary disease ([place]) 08/17/2012  . 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 edema, with long-term current use of insulin ([place]) 08/17/2012  . Chronic systolic heart failure ([place]) 07/30/2012    Resolved Hospital Problems   Diagnosis Date Noted Date Resolved  . Sepsis ([place]) 11/15/2019 11/24/2019    Priority: High    Discharge Instructions    Diet Carb Modified   Complete by: As directed    Discharge wound care:   Complete by: As directed    Continue wound care to left hand/arm skin tears     Allergies as of 11/24/2019      Reactions   Metformin And Related Diarrhea   Sulfa Antibiotics Rash      Medication List    STOP taking these medications   Fish Oil 1200 MG Caps   ICAPS AREDS 2 PO   nystatin powder Commonly known as: MYCOSTATIN/NYSTOP     TAKE these medications   Alcohol Prep Pads Use twice daily prior to SQ injection of insulin to clean skin   aspirin EC 81 MG tablet Take 81 mg daily by mouth.   atorvastatin 80  MG tablet Commonly known as: LIPITOR TAKE 1 TABLET BY MOUTH EVERY DAY What changed: when to take this   B-12 1000 MCG Caps Take 1,000 mcg by mouth daily.   [employer] Calcium 600+D 600-800 MG-UNIT Tabs Generic drug: Calcium Carb-Cholecalciferol Take 1 tablet by mouth in the morning and at bedtime.   HYDROcodone-acetaminophen 10-325 MG tablet Commonly known as: NORCO Take 1 tablet by mouth every 8 (eight) hours as needed. What changed: when to take this   insulin glargine 100 UNIT/ML injection Commonly known as: Lantus INJECT 65 UNITS INTO THE SKIN TWICE DAILY WITH A MEAL What changed:   how much to take  how to take this  when to take this  additional instructions   INSULIN SYRINGE 1CC/29G 29G X 1/2" 1 ML Misc For lantus injections twice  daily   losartan 25 MG tablet Commonly known as: COZAAR TAKE 1 TABLET BY MOUTH EVERY DAY What changed: when to take this   metoprolol succinate 25 MG 24 hr tablet Commonly known as: TOPROL-XL Take 1 tablet (25 mg total) by mouth daily. What changed:   how much to take  when to take this   omeprazole 40 MG capsule Commonly known as: PRILOSEC TAKE 1 CAPSULE BY MOUTH EVERY DAY What changed:   how much to take  when to take this   OneTouch Delica Lancets 93Y Misc To check blood sugar daily  DX: E11.9   OneTouch Verio test strip Generic drug: glucose blood TO CHECK BLOOD SUGAR ONCE DAILY.   oxybutynin 10 MG 24 hr tablet Commonly known as: DITROPAN-XL TAKE 1 TABLET BY MOUTH EVERYDAY AT BEDTIME What changed: See the new instructions.   sertraline 25 MG tablet Commonly known as: ZOLOFT Take 25 mg by mouth daily.   Trulicity 1.5 BO/1.7PZ Sopn Generic drug: Dulaglutide INJECT 1.5MG  INTO THE SKIN ONCE A WEEK AS DIRECTED What changed: See the new instructions.   Vitamin D (Ergocalciferol) 1.25 MG (50000 UNIT) Caps capsule Commonly known as: DRISDOL TAKE ONE CAPSULE BY MOUTH ONCE WEEKLY ON THE SAME DAY EACH WEEK (ON FRIDAYS) What changed: See the new instructions.            Discharge Care Instructions  (From admission, onward)         Start     Ordered   11/24/19 0000  Discharge wound care:       Comments: Continue wound care to left hand/arm skin tears   11/24/19 1633          Allergies  Allergen Reactions  . Metformin And Related Diarrhea  . Sulfa Antibiotics Rash    Consultations: Neurosurgery ID  Discharge Exam: BP (!) 155/61 (BP Location: Right Arm)   Pulse 99   Temp 98.3 F (36.8 C) (Oral)   Resp 18   Ht 5' (1.524 m)   Wt 72.8 kg   SpO2 97%   BMI 31.35 kg/m  General appearance: Mentation still good and normal. Remains awake and AOx4.  Still has 5/10 HA but stable. Still has nasal drip Head: Normocephalic, without obvious  abnormality, atraumatic Eyes: EOMI Lungs: coarse breath sounds bilaterally, stable Heart: regular rate and rhythm and S1, S2 normal Abdomen: normal findings: bowel sounds normal and soft, non-tender Extremities: no edema Skin: Skin abrasion noted on left wrist covered with dressing.  Senile purpura noted throughout and skin is very soft and fragile throughout Neurologic: Grossly normal  The results of significant diagnostics from this hospitalization (including imaging, microbiology, ancillary and laboratory) are listed  below for reference.   Microbiology: Recent Results (from the past 240 hour(s))  Urine culture     Status: Abnormal   Collection Time: 11/15/19 11:03 AM   Specimen: In/Out Cath Urine  Result Value Ref Range Status   Specimen Description   Final    IN/OUT CATH URINE Performed at [facility], [address], [place], [place] [address]    Special Requests   Final    NONE Performed at [facility], [address] [place], [place] [address]    Culture >=100,000 COLONIES/mL ESCHERICHIA COLI (A)  Final   Report Status 11/18/2019 FINAL  Final   Organism ID, Bacteria ESCHERICHIA COLI (A)  Final      Susceptibility   Escherichia coli - MIC*    AMPICILLIN >=32 RESISTANT Resistant     CEFAZOLIN <=4 SENSITIVE Sensitive     CEFTRIAXONE <=0.25 SENSITIVE Sensitive     CIPROFLOXACIN <=0.25 SENSITIVE Sensitive     GENTAMICIN <=1 SENSITIVE Sensitive     IMIPENEM <=0.25 SENSITIVE Sensitive     NITROFURANTOIN <=16 SENSITIVE Sensitive     TRIMETH/SULFA <=20 SENSITIVE Sensitive     AMPICILLIN/SULBACTAM 8 SENSITIVE Sensitive     PIP/TAZO <=4 SENSITIVE Sensitive     * >=100,000 COLONIES/mL ESCHERICHIA COLI  Blood culture (routine x 2)     Status: Abnormal   Collection Time: 11/15/19  6:38 PM   Specimen: BLOOD  Result Value Ref Range Status   Specimen Description   Final    BLOOD LEFT ANTECUBITAL Performed at [facility], [place] [address]  [place], [place] [address]    Special Requests   Final    BOTTLES DRAWN AEROBIC AND ANAEROBIC Blood Culture results may not be optimal due to an excessive volume of blood received in culture bottles Performed at [facility], [address] [place], [place] [address]    Culture  Setup Time   Final    Organism ID to follow [place] BOTTLES CRITICAL RESULT CALLED TO, READ BACK BY AND VERIFIED WITH: SUSAN WATSON AT 0820 ON 11/16/19 SNG Performed at [facility], [address] [place], [place] [address]    Culture STREPTOCOCCUS PNEUMONIAE (A)  Final   Report Status 11/18/2019 FINAL  Final   Organism ID, Bacteria STREPTOCOCCUS PNEUMONIAE  Final      Susceptibility   Streptococcus pneumoniae - MIC*    ERYTHROMYCIN <=0.12 SENSITIVE Sensitive     LEVOFLOXACIN 0.5 SENSITIVE Sensitive     VANCOMYCIN 0.5 SENSITIVE Sensitive     PENICILLIN (meningitis) <=0.06 SENSITIVE Sensitive     PENO - penicillin <=0.06      PENICILLIN (non-meningitis) <=0.06 SENSITIVE Sensitive     PENICILLIN (oral) <=0.06 SENSITIVE Sensitive     CEFTRIAXONE (non-meningitis) <=0.12 SENSITIVE Sensitive     CEFTRIAXONE (meningitis) <=0.12 SENSITIVE Sensitive     * STREPTOCOCCUS PNEUMONIAE  SARS Coronavirus 2 by RT PCR (hospital order, performed in [place] hospital lab) Nasopharyngeal Nasopharyngeal Swab     Status: None   Collection Time: 11/15/19  6:38 PM   Specimen: Nasopharyngeal Swab  Result Value Ref Range Status   SARS Coronavirus 2 NEGATIVE NEGATIVE Final    Comment: (NOTE) SARS-CoV-2 target nucleic acids are NOT DETECTED.  The SARS-CoV-2 RNA is generally detectable in upper and lower respiratory specimens during the acute phase of infection. The lowest concentration of SARS-CoV-2 viral copies this assay can detect is 250 copies / mL. A negative result  does not preclude SARS-CoV-2 infection and should not be used as the sole basis for treatment or  other patient management decisions.  A negative result may occur with improper specimen collection / handling, submission of specimen other than nasopharyngeal swab, presence of viral mutation(s) within the areas targeted by this assay, and inadequate number of viral copies (<250 copies / mL). A negative result must be combined with clinical observations, patient history, and epidemiological information.  Fact Sheet for Patients:   StrictlyIdeas.no  Fact Sheet for Healthcare Providers: BankingDealers.co.za  This test is not yet approved or  cleared by the [place] FDA and has been authorized for detection and/or diagnosis of SARS-CoV-2 by FDA under an Emergency Use Authorization (EUA).  This EUA will remain in effect (meaning this test can be used) for the duration of the COVID-19 declaration under Section 564(b)(1) of the Act, 21 U.S.C. section 360bbb-3(b)(1), unless the authorization is terminated or revoked sooner.  Performed at [facility], [address] [place], [place] [address]   Blood Culture ID Panel (Reflexed)     Status: Abnormal   Collection Time: 11/15/19  6:38 PM  Result Value Ref Range Status   Enterococcus faecalis NOT DETECTED NOT DETECTED Final   Enterococcus Faecium NOT DETECTED NOT DETECTED Final   Listeria monocytogenes NOT DETECTED NOT DETECTED Final   Staphylococcus species NOT DETECTED NOT DETECTED Final   Staphylococcus aureus (BCID) NOT DETECTED NOT DETECTED Final   Staphylococcus epidermidis NOT DETECTED NOT DETECTED Final   Staphylococcus lugdunensis NOT DETECTED NOT DETECTED Final   Streptococcus species DETECTED (A) NOT DETECTED Final    Comment: CRITICAL RESULT CALLED TO, READ BACK BY AND VERIFIED WITH: SUSAN WATSON AT 0820 ON 11/16/19 SNG    Streptococcus agalactiae NOT DETECTED NOT DETECTED Final   Streptococcus pneumoniae DETECTED (A) NOT DETECTED Final    Comment: CRITICAL RESULT  CALLED TO, READ BACK BY AND VERIFIED WITH: SUSAN WATSON AT 0820 ON 11/16/19 SNG    Streptococcus pyogenes NOT DETECTED NOT DETECTED Final   A.calcoaceticus-baumannii NOT DETECTED NOT DETECTED Final   Bacteroides fragilis NOT DETECTED NOT DETECTED Final   Enterobacterales NOT DETECTED NOT DETECTED Final   Enterobacter cloacae complex NOT DETECTED NOT DETECTED Final   Escherichia coli NOT DETECTED NOT DETECTED Final   Klebsiella aerogenes NOT DETECTED NOT DETECTED Final   Klebsiella oxytoca NOT DETECTED NOT DETECTED Final   Klebsiella pneumoniae NOT DETECTED NOT DETECTED Final   Proteus species NOT DETECTED NOT DETECTED Final   Salmonella species NOT DETECTED NOT DETECTED Final   Serratia marcescens NOT DETECTED NOT DETECTED Final   Haemophilus influenzae NOT DETECTED NOT DETECTED Final   Neisseria meningitidis NOT DETECTED NOT DETECTED Final   Pseudomonas aeruginosa NOT DETECTED NOT DETECTED Final   Stenotrophomonas maltophilia NOT DETECTED NOT DETECTED Final   Candida albicans NOT DETECTED NOT DETECTED Final   Candida auris NOT DETECTED NOT DETECTED Final   Candida glabrata NOT DETECTED NOT DETECTED Final   Candida krusei NOT DETECTED NOT DETECTED Final   Candida parapsilosis NOT DETECTED NOT DETECTED Final   Candida tropicalis NOT DETECTED NOT DETECTED Final   Cryptococcus neoformans/gattii NOT DETECTED NOT DETECTED Final    Comment: Performed at [facility], [address] [place], [place] [address]  Culture, blood (Routine X 2) w Reflex to ID Panel     Status: None   Collection Time: 11/16/19 11:53 PM   Specimen: BLOOD  Result Value Ref Range Status   Specimen Description BLOOD BLOOD  LEFT FOREARM  Final   Special Requests   Final    BOTTLES DRAWN AEROBIC AND ANAEROBIC Blood Culture adequate volume   Culture   Final    NO GROWTH 5 DAYS Performed at [facility], [address] [place], [place] [address]    Report Status 11/22/2019 FINAL  Final  Culture,  blood (Routine X 2) w Reflex to ID Panel     Status: None   Collection Time: 11/16/19 11:53 PM   Specimen: BLOOD  Result Value Ref Range Status   Specimen Description BLOOD BLOOD LEFT HAND  Final   Special Requests   Final    BOTTLES DRAWN AEROBIC AND ANAEROBIC Blood Culture adequate volume   Culture   Final    NO GROWTH 5 DAYS Performed at [facility], [address] [place], [place] [address]    Report Status 11/22/2019 FINAL  Final     Labs: BNP (last 3 results) No results for input(s): BNP in the last 8760 hours. Basic Metabolic Panel: Recent Labs  Lab 11/20/19 0315 11/21/19 0455 11/22/19 0517 11/23/19 0357 11/24/19 0455  NA 137 137 138 138 138  K 4.2 3.9 4.6 4.4 4.1  CL 105 102 101 100 100  CO2 24 27 27 28 28   GLUCOSE 237* 109* 166* 95 197*  BUN 22 18 18 20 23   CREATININE 1.05* 0.94 0.94 1.03* 1.02*  CALCIUM 8.7* 8.6* 8.7* 8.6* 8.8*  MG  --  2.0 1.9 2.1 2.2   Liver Function Tests: No results for input(s): AST, ALT, ALKPHOS, BILITOT, PROT, ALBUMIN in the last 168 hours. No results for input(s): LIPASE, AMYLASE in the last 168 hours. No results for input(s): AMMONIA in the last 168 hours. CBC: Recent Labs  Lab 11/19/19 0346 11/21/19 0455 11/22/19 0517 11/23/19 0357 11/24/19 0455  WBC 12.3* 12.2* 11.4* 11.2* 10.1  NEUTROABS 8.7* 9.1* 8.3* 7.7 6.6  HGB 10.2* 9.2* 10.0* 8.9* 9.8*  HCT 29.5* 28.2* 30.7* 28.4* 29.1*  MCV 89.1 93.4 92.7 95.0 90.4  PLT 147* 167 233 231 289   Cardiac Enzymes: No results for input(s): CKTOTAL, CKMB, CKMBINDEX, TROPONINI in the last 168 hours. BNP: Invalid input(s): POCBNP CBG: Recent Labs  Lab 11/23/19 1146 11/23/19 1649 11/23/19 2050 11/24/19 0740 11/24/19 1153  GLUCAP 180* 335* 392* 154* 310*   D-Dimer No results for input(s): DDIMER in the last 72 hours. Hgb A1c No results for input(s): HGBA1C in the last 72 hours. Lipid Profile No results for input(s): CHOL, HDL, LDLCALC, TRIG, CHOLHDL, LDLDIRECT in  the last 72 hours. Thyroid function studies No results for input(s): TSH, T4TOTAL, T3FREE, THYROIDAB in the last 72 hours.  Invalid input(s): FREET3 Anemia work up No results for input(s): VITAMINB12, FOLATE, FERRITIN, TIBC, IRON, RETICCTPCT in the last 72 hours. Urinalysis    Component Value Date/Time   COLORURINE YELLOW (A) 11/15/2019 1103   APPEARANCEUR CLOUDY (A) 11/15/2019 1103   APPEARANCEUR Clear 01/16/2014 1530   LABSPEC 1.013 11/15/2019 1103   LABSPEC 1.009 01/16/2014 1530   PHURINE 5.0 11/15/2019 1103   GLUCOSEU >=500 (A) 11/15/2019 1103   GLUCOSEU Negative 01/16/2014 1530   HGBUR NEGATIVE 11/15/2019 1103   BILIRUBINUR NEGATIVE 11/15/2019 1103   BILIRUBINUR Negative 12/16/2016 1519   BILIRUBINUR Negative 01/16/2014 1530   KETONESUR 5 (A) 11/15/2019 1103   PROTEINUR >=300 (A) 11/15/2019 1103   UROBILINOGEN 0.2 12/16/2016 1519   NITRITE NEGATIVE 11/15/2019 1103   LEUKOCYTESUR SMALL (A) 11/15/2019 1103   LEUKOCYTESUR Negative 01/16/2014 1530  Sepsis Labs Invalid input(s): PROCALCITONIN,  WBC,  LACTICIDVEN Microbiology Recent Results (from the past 240 hour(s))  Urine culture     Status: Abnormal   Collection Time: 11/15/19 11:03 AM   Specimen: In/Out Cath Urine  Result Value Ref Range Status   Specimen Description   Final    IN/OUT CATH URINE Performed at [facility], [address] [place], [place] [address]    Special Requests   Final    NONE Performed at [facility], [address] [place], [place] [address]    Culture >=100,000 COLONIES/mL ESCHERICHIA COLI (A)  Final   Report Status 11/18/2019 FINAL  Final   Organism ID, Bacteria ESCHERICHIA COLI (A)  Final      Susceptibility   Escherichia coli - MIC*    AMPICILLIN >=32 RESISTANT Resistant     CEFAZOLIN <=4 SENSITIVE Sensitive     CEFTRIAXONE <=0.25 SENSITIVE Sensitive     CIPROFLOXACIN <=0.25 SENSITIVE Sensitive     GENTAMICIN <=1 SENSITIVE Sensitive     IMIPENEM <=0.25  SENSITIVE Sensitive     NITROFURANTOIN <=16 SENSITIVE Sensitive     TRIMETH/SULFA <=20 SENSITIVE Sensitive     AMPICILLIN/SULBACTAM 8 SENSITIVE Sensitive     PIP/TAZO <=4 SENSITIVE Sensitive     * >=100,000 COLONIES/mL ESCHERICHIA COLI  Blood culture (routine x 2)     Status: Abnormal   Collection Time: 11/15/19  6:38 PM   Specimen: BLOOD  Result Value Ref Range Status   Specimen Description   Final    BLOOD LEFT ANTECUBITAL Performed at [facility], [place] [address] [place], [place] [address]    Special Requests   Final    BOTTLES DRAWN AEROBIC AND ANAEROBIC Blood Culture results may not be optimal due to an excessive volume of blood received in culture bottles Performed at [facility], [address] [place], [place] [address]    Culture  Setup Time   Final    Organism ID to follow [place] BOTTLES CRITICAL RESULT CALLED TO, READ BACK BY AND VERIFIED WITH: SUSAN WATSON AT 0820 ON 11/16/19 SNG Performed at [facility], [address] [place],  [address]    Culture STREPTOCOCCUS PNEUMONIAE (A)  Final   Report Status 11/18/2019 FINAL  Final   Organism ID, Bacteria STREPTOCOCCUS PNEUMONIAE  Final      Susceptibility   Streptococcus pneumoniae - MIC*    ERYTHROMYCIN <=0.12 SENSITIVE Sensitive     LEVOFLOXACIN 0.5 SENSITIVE Sensitive     VANCOMYCIN 0.5 SENSITIVE Sensitive     PENICILLIN (meningitis) <=0.06 SENSITIVE Sensitive     PENO - penicillin <=0.06      PENICILLIN (non-meningitis) <=0.06 SENSITIVE Sensitive     PENICILLIN (oral) <=0.06 SENSITIVE Sensitive     CEFTRIAXONE (non-meningitis) <=0.12 SENSITIVE Sensitive     CEFTRIAXONE (meningitis) <=0.12 SENSITIVE Sensitive     * STREPTOCOCCUS PNEUMONIAE  SARS Coronavirus 2 by RT PCR (hospital order, performed in [place] hospital lab) Nasopharyngeal Nasopharyngeal Swab     Status: None   Collection Time: 11/15/19  6:38 PM   Specimen: Nasopharyngeal  Swab  Result Value Ref Range Status   SARS Coronavirus 2 NEGATIVE NEGATIVE Final    Comment: (NOTE) SARS-CoV-2 target nucleic acids are NOT DETECTED.  The SARS-CoV-2 RNA is generally detectable in upper and lower respiratory specimens during the acute phase of infection. The lowest concentration of SARS-CoV-2 viral copies this assay can detect is 250 copies /  mL. A negative result does not preclude SARS-CoV-2 infection and should not be used as the sole basis for treatment or other patient management decisions.  A negative result may occur with improper specimen collection / handling, submission of specimen other than nasopharyngeal swab, presence of viral mutation(s) within the areas targeted by this assay, and inadequate number of viral copies (<250 copies / mL). A negative result must be combined with clinical observations, patient history, and epidemiological information.  Fact Sheet for Patients:   StrictlyIdeas.no  Fact Sheet for Healthcare Providers: [email or web address]  This test is not yet approved or  cleared by the [place] FDA and has been authorized for detection and/or diagnosis of SARS-CoV-2 by FDA under an Emergency Use Authorization (EUA).  This EUA will remain in effect (meaning this test can be used) for the duration of the COVID-19 declaration under Section 564(b)(1) of the Act, 21 U.S.C. section 360bbb-3(b)(1), unless the authorization is terminated or revoked sooner.  Performed at [facility], [address] [place], [place] [address]   Blood Culture ID Panel (Reflexed)     Status: Abnormal   Collection Time: 11/15/19  6:38 PM  Result Value Ref Range Status   Enterococcus faecalis NOT DETECTED NOT DETECTED Final   Enterococcus Faecium NOT DETECTED NOT DETECTED Final   Listeria monocytogenes NOT DETECTED NOT DETECTED Final   Staphylococcus species NOT DETECTED NOT DETECTED Final    Staphylococcus aureus (BCID) NOT DETECTED NOT DETECTED Final   Staphylococcus epidermidis NOT DETECTED NOT DETECTED Final   Staphylococcus lugdunensis NOT DETECTED NOT DETECTED Final   Streptococcus species DETECTED (A) NOT DETECTED Final    Comment: CRITICAL RESULT CALLED TO, READ BACK BY AND VERIFIED WITH: SUSAN WATSON AT 0820 ON 11/16/19 SNG    Streptococcus agalactiae NOT DETECTED NOT DETECTED Final   Streptococcus pneumoniae DETECTED (A) NOT DETECTED Final    Comment: CRITICAL RESULT CALLED TO, READ BACK BY AND VERIFIED WITH: SUSAN WATSON AT 0820 ON 11/16/19 SNG    Streptococcus pyogenes NOT DETECTED NOT DETECTED Final   A.calcoaceticus-baumannii NOT DETECTED NOT DETECTED Final   Bacteroides fragilis NOT DETECTED NOT DETECTED Final   Enterobacterales NOT DETECTED NOT DETECTED Final   Enterobacter cloacae complex NOT DETECTED NOT DETECTED Final   Escherichia coli NOT DETECTED NOT DETECTED Final   Klebsiella aerogenes NOT DETECTED NOT DETECTED Final   Klebsiella oxytoca NOT DETECTED NOT DETECTED Final   Klebsiella pneumoniae NOT DETECTED NOT DETECTED Final   Proteus species NOT DETECTED NOT DETECTED Final   Salmonella species NOT DETECTED NOT DETECTED Final   Serratia marcescens NOT DETECTED NOT DETECTED Final   Haemophilus influenzae NOT DETECTED NOT DETECTED Final   Neisseria meningitidis NOT DETECTED NOT DETECTED Final   Pseudomonas aeruginosa NOT DETECTED NOT DETECTED Final   Stenotrophomonas maltophilia NOT DETECTED NOT DETECTED Final   Candida albicans NOT DETECTED NOT DETECTED Final   Candida auris NOT DETECTED NOT DETECTED Final   Candida glabrata NOT DETECTED NOT DETECTED Final   Candida krusei NOT DETECTED NOT DETECTED Final   Candida parapsilosis NOT DETECTED NOT DETECTED Final   Candida tropicalis NOT DETECTED NOT DETECTED Final   Cryptococcus neoformans/gattii NOT DETECTED NOT DETECTED Final    Comment: Performed at [facility], [address]  [place], [place] [address]  Culture, blood (Routine X 2) w Reflex to ID Panel     Status: None   Collection Time: 11/16/19 11:53 PM   Specimen: BLOOD  Result Value Ref Range Status  Specimen Description BLOOD BLOOD LEFT FOREARM  Final   Special Requests   Final    BOTTLES DRAWN AEROBIC AND ANAEROBIC Blood Culture adequate volume   Culture   Final    NO GROWTH 5 DAYS Performed at [facility], [address] [place], [place] [address]    Report Status 11/22/2019 FINAL  Final  Culture, blood (Routine X 2) w Reflex to ID Panel     Status: None   Collection Time: 11/16/19 11:53 PM   Specimen: BLOOD  Result Value Ref Range Status   Specimen Description BLOOD BLOOD LEFT HAND  Final   Special Requests   Final    BOTTLES DRAWN AEROBIC AND ANAEROBIC Blood Culture adequate volume   Culture   Final    NO GROWTH 5 DAYS Performed at [facility], [address] [place],  [address]    Report Status 11/22/2019 FINAL  Final    Procedures/Studies: CT Head Wo Contrast  Result Date: 11/15/2019 CLINICAL DATA:  Headache EXAM: CT HEAD WITHOUT CONTRAST TECHNIQUE: Contiguous axial images were obtained from the base of the skull through the vertex without intravenous contrast. COMPARISON:  None. FINDINGS: Brain: No evidence of acute infarction, hemorrhage, hydrocephalus, extra-axial collection or mass lesion/mass effect. Mild age related cerebral volume loss. Vascular: Atherosclerotic calcifications involving the large vessels of the skull base. No unexpected hyperdense vessel. Skull: Normal. Negative for fracture or focal lesion. Sinuses/Orbits: Mild mucosal thickening within the left ethmoid air cells. Remaining visualized paranasal sinuses and mastoid air cells are clear. Orbital structures intact. Other: None. IMPRESSION: 1. No acute intracranial findings. 2. Mild left ethmoid sinus disease. Electronically Signed   By: Davina Poke D.O.   On: 11/15/2019 11:26   CT MAXILLOFACIAL  W & WO CONTRAST  Result Date: 11/21/2019 CLINICAL DATA:  Rule out CSF leak. Recent history of headache and positive blood cultures for strep pneumonia. Presumed meningitis. Nasal drainage fluid. Beta transferrin pending. EXAM: CT MAXILLOFACIAL WITHOUT AND WITH CONTRAST TECHNIQUE: Multidetector CT imaging of the maxillofacial structures was performed without and after intrathecal contrast. Multiplanar CT image reconstructions were also generated. CONTRAST:  63mL OMNIPAQUE IOHEXOL 300 MG/ML SOLN Intrathecal contrast injected in the lumbar spine by Dr. Vernard Gambles. COMPARISON:  CT head 11/15/2019 FINDINGS: Precontrast CT of the sinuses demonstrates an air-fluid level in the left sphenoid sinus. This fluid density is approximately 50 Hounsfield units. No other air-fluid levels. No bony defect is seen in the skull base. No intracranial mass or meningocele identified. Remaining paranasal sinuses are clear. Bilateral cataract extraction.  No orbital mass. Following intrathecal infusion of contrast, repeat CT was performed of the sinuses. Delayed imaging also performed 4 hours later. The patient initially refused delayed scanning however after further discussion with the patient, this was performed. The air-fluid level in the left sphenoid sinus demonstrates slight increased density compared to the pre contrast fluid measuring approximately 65-75 Hounsfield units. This was considered indeterminate and therefore delayed imaging was requested and eventually performed. The 4 hour delayed imaging shows definitive contrast in the left sphenoid sinus with high density material measuring approximately 375 Hounsfield units. No other area of contrast leak is identified in the sinuses. No bony defect identified as a cause of the leak. IMPRESSION: 1. Air-fluid level left sphenoid sinus. On delayed imaging, there is definitive contrast in the left sphenoid sinus compatible with CSF leak. No cause for the leak identified. No fracture,  mass, or bone defect identified. No meningocele identified. Remaining sinuses clear. 2. These results  were called by telephone at the time of interpretation on 11/21/2019 at 8:19 am to provider Kahleel Fadeley Sabino Gasser , who verbally acknowledged these results. Electronically Signed   By: Franchot Gallo M.D.   On: 11/21/2019 08:19   DG Chest Port 1 View  Result Date: 11/24/2019 CLINICAL DATA:  Hypoxia.  Acute mental status change. EXAM: PORTABLE CHEST 1 VIEW COMPARISON:  November 20, 2019 FINDINGS: Worsening of diffuse bilateral patchy pulmonary infiltrates. No pneumothorax. The cardiomediastinal silhouette is stable. IMPRESSION: Significant interval worsening of bilateral diffuse patchy pulmonary infiltrates most consistent with multifocal pneumonia. Recommend short-term follow-up imaging to ensure resolution. Electronically Signed   By: Dorise Bullion III M.D   On: 11/24/2019 10:58   DG Chest Port 1 View  Result Date: 11/20/2019 CLINICAL DATA:  Shortness of breath EXAM: PORTABLE CHEST 1 VIEW COMPARISON:  November 15, 2019 FINDINGS: Ill-defined airspace opacity is noted in each upper lobe. Lungs elsewhere clear. Heart size and pulmonary vascularity are normal. No adenopathy. There is aortic atherosclerosis. Bones are osteoporotic. IMPRESSION: Ill-defined airspace opacity in each upper lobe, concerning for foci of pneumonia. Lungs elsewhere clear. Stable cardiac silhouette. Aortic Atherosclerosis (ICD10-I70.0). Electronically Signed   By: Lowella Grip III M.D.   On: 11/20/2019 13:38   DG Chest Port 1 View  Result Date: 11/15/2019 CLINICAL DATA:  Onset headache and altered mental status today. EXAM: PORTABLE CHEST 1 VIEW COMPARISON:  Single-view of the chest 01/01/2017. FINDINGS: Lung volumes are low with mild left basilar atelectasis. Cardiomegaly and vascular congestion. No consolidative process, pneumothorax or effusion. Aortic atherosclerosis noted. No acute or focal bony abnormality. IMPRESSION:  Cardiomegaly and vascular congestion. Mild basilar atelectasis in a low volume chest. Aortic Atherosclerosis (ICD10-I70.0). Electronically Signed   By: Inge Rise M.D.   On: 11/15/2019 12:12   DG FLUORO GUIDED NEEDLE PLC ASPIRATION/INJECTION LOC  Result Date: 11/20/2019 CLINICAL DATA:  Rhinorrhea, meningitis, evaluate for spontaneous CSF leak. Contrast injection requested for CT cisternogram. EXAM: INTRATHECAL INJECTION UNDER FLUOROSCOPY FLUOROSCOPY TIME:  24 seconds TECHNIQUE: The procedure, risks (including but not limited to bleeding, infection, organ damage ), benefits, and alternatives were explained to the patient. Questions regarding the procedure were encouraged and answered. The patient understands and consents to the procedure. An appropriate skin entry site was determined fluoroscopically. Operator donned sterile gloves and mask. Skin site was marked, then prepped with Betadine, draped in usual sterile fashion, and infiltrated locally with 1% lidocaine. A 20 gauge spinal needle advanced into the thecal sac at L2-3 from a left interlaminar approach. 10 mL Omnipaque 300 was then administered intrathecally. The needle was then removed. Using protocol table tilt maneuver, the contrast was directed intracranially with fluoroscopic confirmation. The patient tolerated the procedure well. COMPLICATIONS: None immediate IMPRESSION: 1. Technically successful intrathecal contrast injection under fluoroscopy. Electronically Signed   By: Lucrezia Europe M.D.   On: 11/20/2019 14:49   ECHOCARDIOGRAM COMPLETE  Result Date: 11/18/2019    ECHOCARDIOGRAM REPORT   Patient Name:   GENNY CAULDER Date of Exam: 11/18/2019 Medical Rec #:  [id number]        Height:       60.0 in Accession #:    [id number]       Weight:       160.5 lb Date of Birth:  1945-06-09        BSA:          1.700 m Patient Age:    74 years         BP:  110/47 mmHg Patient Gender: F                HR:           92 bpm. Exam Location:  [facility]  Procedure: 2D Echo Indications:     Bacteremia R78.81  History:         Patient has prior history of Echocardiogram examinations, most                  recent 05/27/2016.  Sonographer:     Arville Go RDCS Referring Phys:  [id number] Tsosie Billing Diagnosing Phys: Ida Rogue MD  Sonographer Comments: Technically difficult study due to poor echo windows, suboptimal apical window and suboptimal subcostal window. Image acquisition challenging due to patient body habitus. IMPRESSIONS  1. Challenging images  2. Left ventricular ejection fraction, by grossly estimated at 40 to 45% though difficult to visualize. The left ventricle has mildly decreased function. The left ventricle demonstrates regional wall motion abnormalities (images concerning for anterior wall hypokinesis). Left ventricular diastolic parameters are consistent with Grade I diastolic dysfunction (impaired relaxation).  3. Right ventricular systolic function is normal. The right ventricular size is normal.  4. Valves not well visualized. FINDINGS  Left Ventricle: Left ventricular ejection fraction, by estimation, is 40 to 45%. The left ventricle has mildly decreased function. The left ventricle demonstrates regional wall motion abnormalities. The left ventricular internal cavity size was normal in size. There is no left ventricular hypertrophy. Left ventricular diastolic parameters are consistent with Grade I diastolic dysfunction (impaired relaxation). Right Ventricle: The right ventricular size is normal. No increase in right ventricular wall thickness. Right ventricular systolic function is normal. Left Atrium: Left atrial size was normal in size. Right Atrium: Right atrial size was normal in size. Pericardium: There is no evidence of pericardial effusion. Mitral Valve: The mitral valve is normal in structure. No evidence of mitral valve regurgitation. No evidence of mitral valve stenosis. Tricuspid Valve: The tricuspid valve is normal in  structure. Tricuspid valve regurgitation is not demonstrated. No evidence of tricuspid stenosis. Aortic Valve: The aortic valve was not well visualized. Aortic valve regurgitation is not visualized. No aortic stenosis is present. Aortic valve mean gradient measures 4.0 mmHg. Aortic valve peak gradient measures 8.4 mmHg. Aortic valve area, by VTI measures 1.56 cm. Pulmonic Valve: The pulmonic valve was normal in structure. Pulmonic valve regurgitation is not visualized. No evidence of pulmonic stenosis. Aorta: The aortic root is normal in size and structure. Venous: The inferior vena cava is normal in size with greater than 50% respiratory variability, suggesting right atrial pressure of 3 mmHg. IAS/Shunts: No atrial level shunt detected by color flow Doppler.  LEFT VENTRICLE PLAX 2D LVIDd:         5.36 cm  Diastology LVIDs:         3.76 cm  LV e' medial:    5.98 cm/s LV PW:         0.90 cm  LV E/e' medial:  14.9 LV IVS:        0.82 cm  LV e' lateral:   5.66 cm/s LVOT diam:     1.90 cm  LV E/e' lateral: 15.8 LV SV:         43 LV SV Index:   26 LVOT Area:     2.84 cm  LEFT ATRIUM           Index LA diam:      3.40 cm 2.00 cm/m LA Vol (A4C): 42.2  ml 24.82 ml/m  AORTIC VALVE                   PULMONIC VALVE AV Area (Vmax):    1.36 cm    PV Vmax:       0.99 m/s AV Area (Vmean):   1.42 cm    PV Peak grad:  3.9 mmHg AV Area (VTI):     1.56 cm AV Vmax:           145.00 cm/s AV Vmean:          94.600 cm/s AV VTI:            0.278 m AV Peak Grad:      8.4 mmHg AV Mean Grad:      4.0 mmHg LVOT Vmax:         69.50 cm/s LVOT Vmean:        47.300 cm/s LVOT VTI:          0.153 m LVOT/AV VTI ratio: 0.55  AORTA Ao Root diam: 2.70 cm MITRAL VALVE MV Area (PHT): 1.90 cm     SHUNTS MV Decel Time: 400 msec     Systemic VTI:  0.15 m MV E velocity: 89.25 cm/s   Systemic Diam: 1.90 cm MV A velocity: 127.00 cm/s MV E/A ratio:  0.70 Ida Rogue MD Electronically signed by Ida Rogue MD Signature Date/Time: 11/18/2019/1:34:17 PM     Final      Time coordinating discharge: Over 30 minutes    Dwyane Dee, MD  [facility] 11/24/2019, 4:39 PM Pager: Secure chat  If 7PM-7AM, please contact night-coverage [email or web address] Password [email or web address]

## 2019-12-03 DIAGNOSIS — Z03818 Encounter for observation for suspected exposure to other biological agents ruled out: Secondary | ICD-10-CM | POA: Diagnosis not present

## 2019-12-06 DIAGNOSIS — J449 Chronic obstructive pulmonary disease, unspecified: Secondary | ICD-10-CM | POA: Diagnosis not present

## 2019-12-06 DIAGNOSIS — M810 Age-related osteoporosis without current pathological fracture: Secondary | ICD-10-CM | POA: Diagnosis not present

## 2019-12-06 DIAGNOSIS — Z23 Encounter for immunization: Secondary | ICD-10-CM | POA: Diagnosis not present

## 2019-12-06 DIAGNOSIS — G9600 Cerebrospinal fluid leak, unspecified: Secondary | ICD-10-CM | POA: Diagnosis not present

## 2019-12-06 DIAGNOSIS — Z8679 Personal history of other diseases of the circulatory system: Secondary | ICD-10-CM | POA: Diagnosis not present

## 2019-12-06 DIAGNOSIS — G9601 Cranial cerebrospinal fluid leak, spontaneous: Secondary | ICD-10-CM | POA: Diagnosis not present

## 2019-12-06 DIAGNOSIS — Z9861 Coronary angioplasty status: Secondary | ICD-10-CM | POA: Diagnosis not present

## 2019-12-06 DIAGNOSIS — Z794 Long term (current) use of insulin: Secondary | ICD-10-CM | POA: Diagnosis not present

## 2019-12-06 DIAGNOSIS — Z8673 Personal history of transient ischemic attack (TIA), and cerebral infarction without residual deficits: Secondary | ICD-10-CM | POA: Diagnosis not present

## 2019-12-06 DIAGNOSIS — Z86010 Personal history of colonic polyps: Secondary | ICD-10-CM | POA: Diagnosis not present

## 2019-12-06 DIAGNOSIS — I2510 Atherosclerotic heart disease of native coronary artery without angina pectoris: Secondary | ICD-10-CM | POA: Diagnosis not present

## 2019-12-06 DIAGNOSIS — E785 Hyperlipidemia, unspecified: Secondary | ICD-10-CM | POA: Diagnosis not present

## 2019-12-06 DIAGNOSIS — I110 Hypertensive heart disease with heart failure: Secondary | ICD-10-CM | POA: Diagnosis not present

## 2019-12-06 DIAGNOSIS — R739 Hyperglycemia, unspecified: Secondary | ICD-10-CM | POA: Diagnosis not present

## 2019-12-06 DIAGNOSIS — Z87891 Personal history of nicotine dependence: Secondary | ICD-10-CM | POA: Diagnosis not present

## 2019-12-06 DIAGNOSIS — Z8739 Personal history of other diseases of the musculoskeletal system and connective tissue: Secondary | ICD-10-CM | POA: Diagnosis not present

## 2019-12-06 DIAGNOSIS — E1142 Type 2 diabetes mellitus with diabetic polyneuropathy: Secondary | ICD-10-CM | POA: Diagnosis not present

## 2019-12-06 DIAGNOSIS — Q018 Encephalocele of other sites: Secondary | ICD-10-CM | POA: Diagnosis not present

## 2019-12-06 DIAGNOSIS — I5022 Chronic systolic (congestive) heart failure: Secondary | ICD-10-CM | POA: Diagnosis not present

## 2019-12-06 DIAGNOSIS — R1310 Dysphagia, unspecified: Secondary | ICD-10-CM | POA: Diagnosis not present

## 2019-12-06 DIAGNOSIS — I252 Old myocardial infarction: Secondary | ICD-10-CM | POA: Diagnosis not present

## 2019-12-06 DIAGNOSIS — R638 Other symptoms and signs concerning food and fluid intake: Secondary | ICD-10-CM | POA: Diagnosis not present

## 2019-12-06 DIAGNOSIS — E1165 Type 2 diabetes mellitus with hyperglycemia: Secondary | ICD-10-CM | POA: Diagnosis not present

## 2019-12-06 DIAGNOSIS — Z882 Allergy status to sulfonamides status: Secondary | ICD-10-CM | POA: Diagnosis not present

## 2019-12-07 DIAGNOSIS — R739 Hyperglycemia, unspecified: Secondary | ICD-10-CM | POA: Diagnosis not present

## 2019-12-07 DIAGNOSIS — R638 Other symptoms and signs concerning food and fluid intake: Secondary | ICD-10-CM | POA: Diagnosis not present

## 2019-12-07 DIAGNOSIS — Z794 Long term (current) use of insulin: Secondary | ICD-10-CM | POA: Diagnosis not present

## 2019-12-11 DIAGNOSIS — Z86010 Personal history of colonic polyps: Secondary | ICD-10-CM | POA: Diagnosis not present

## 2019-12-11 DIAGNOSIS — M720 Palmar fascial fibromatosis [Dupuytren]: Secondary | ICD-10-CM | POA: Diagnosis not present

## 2019-12-11 DIAGNOSIS — G8929 Other chronic pain: Secondary | ICD-10-CM | POA: Diagnosis not present

## 2019-12-11 DIAGNOSIS — I25118 Atherosclerotic heart disease of native coronary artery with other forms of angina pectoris: Secondary | ICD-10-CM | POA: Diagnosis not present

## 2019-12-11 DIAGNOSIS — Z794 Long term (current) use of insulin: Secondary | ICD-10-CM | POA: Diagnosis not present

## 2019-12-11 DIAGNOSIS — I110 Hypertensive heart disease with heart failure: Secondary | ICD-10-CM | POA: Diagnosis not present

## 2019-12-11 DIAGNOSIS — N390 Urinary tract infection, site not specified: Secondary | ICD-10-CM | POA: Diagnosis not present

## 2019-12-11 DIAGNOSIS — E119 Type 2 diabetes mellitus without complications: Secondary | ICD-10-CM | POA: Diagnosis not present

## 2019-12-11 DIAGNOSIS — J440 Chronic obstructive pulmonary disease with acute lower respiratory infection: Secondary | ICD-10-CM | POA: Diagnosis not present

## 2019-12-11 DIAGNOSIS — I5022 Chronic systolic (congestive) heart failure: Secondary | ICD-10-CM | POA: Diagnosis not present

## 2019-12-11 DIAGNOSIS — G002 Streptococcal meningitis: Secondary | ICD-10-CM | POA: Diagnosis not present

## 2019-12-11 DIAGNOSIS — E785 Hyperlipidemia, unspecified: Secondary | ICD-10-CM | POA: Diagnosis not present

## 2019-12-11 DIAGNOSIS — G9600 Cerebrospinal fluid leak, unspecified: Secondary | ICD-10-CM | POA: Diagnosis not present

## 2019-12-11 DIAGNOSIS — M810 Age-related osteoporosis without current pathological fracture: Secondary | ICD-10-CM | POA: Diagnosis not present

## 2019-12-11 DIAGNOSIS — Z955 Presence of coronary angioplasty implant and graft: Secondary | ICD-10-CM | POA: Diagnosis not present

## 2019-12-11 DIAGNOSIS — Z79899 Other long term (current) drug therapy: Secondary | ICD-10-CM | POA: Diagnosis not present

## 2019-12-11 DIAGNOSIS — D649 Anemia, unspecified: Secondary | ICD-10-CM | POA: Diagnosis not present

## 2019-12-11 DIAGNOSIS — Z87891 Personal history of nicotine dependence: Secondary | ICD-10-CM | POA: Diagnosis not present

## 2019-12-11 DIAGNOSIS — I252 Old myocardial infarction: Secondary | ICD-10-CM | POA: Diagnosis not present

## 2019-12-11 DIAGNOSIS — I4510 Unspecified right bundle-branch block: Secondary | ICD-10-CM | POA: Diagnosis not present

## 2019-12-11 DIAGNOSIS — M48061 Spinal stenosis, lumbar region without neurogenic claudication: Secondary | ICD-10-CM | POA: Diagnosis not present

## 2019-12-11 DIAGNOSIS — Z8673 Personal history of transient ischemic attack (TIA), and cerebral infarction without residual deficits: Secondary | ICD-10-CM | POA: Diagnosis not present

## 2019-12-13 DIAGNOSIS — Z86010 Personal history of colonic polyps: Secondary | ICD-10-CM | POA: Diagnosis not present

## 2019-12-13 DIAGNOSIS — I5022 Chronic systolic (congestive) heart failure: Secondary | ICD-10-CM | POA: Diagnosis not present

## 2019-12-13 DIAGNOSIS — Z794 Long term (current) use of insulin: Secondary | ICD-10-CM | POA: Diagnosis not present

## 2019-12-13 DIAGNOSIS — D649 Anemia, unspecified: Secondary | ICD-10-CM | POA: Diagnosis not present

## 2019-12-13 DIAGNOSIS — Z79899 Other long term (current) drug therapy: Secondary | ICD-10-CM | POA: Diagnosis not present

## 2019-12-13 DIAGNOSIS — G9600 Cerebrospinal fluid leak, unspecified: Secondary | ICD-10-CM | POA: Diagnosis not present

## 2019-12-13 DIAGNOSIS — M810 Age-related osteoporosis without current pathological fracture: Secondary | ICD-10-CM | POA: Diagnosis not present

## 2019-12-13 DIAGNOSIS — E119 Type 2 diabetes mellitus without complications: Secondary | ICD-10-CM | POA: Diagnosis not present

## 2019-12-13 DIAGNOSIS — J440 Chronic obstructive pulmonary disease with acute lower respiratory infection: Secondary | ICD-10-CM | POA: Diagnosis not present

## 2019-12-13 DIAGNOSIS — M48061 Spinal stenosis, lumbar region without neurogenic claudication: Secondary | ICD-10-CM | POA: Diagnosis not present

## 2019-12-13 DIAGNOSIS — I25118 Atherosclerotic heart disease of native coronary artery with other forms of angina pectoris: Secondary | ICD-10-CM | POA: Diagnosis not present

## 2019-12-13 DIAGNOSIS — N390 Urinary tract infection, site not specified: Secondary | ICD-10-CM | POA: Diagnosis not present

## 2019-12-13 DIAGNOSIS — G8929 Other chronic pain: Secondary | ICD-10-CM | POA: Diagnosis not present

## 2019-12-13 DIAGNOSIS — I4510 Unspecified right bundle-branch block: Secondary | ICD-10-CM | POA: Diagnosis not present

## 2019-12-13 DIAGNOSIS — G002 Streptococcal meningitis: Secondary | ICD-10-CM | POA: Diagnosis not present

## 2019-12-13 DIAGNOSIS — M720 Palmar fascial fibromatosis [Dupuytren]: Secondary | ICD-10-CM | POA: Diagnosis not present

## 2019-12-13 DIAGNOSIS — I110 Hypertensive heart disease with heart failure: Secondary | ICD-10-CM | POA: Diagnosis not present

## 2019-12-13 DIAGNOSIS — Z955 Presence of coronary angioplasty implant and graft: Secondary | ICD-10-CM | POA: Diagnosis not present

## 2019-12-13 DIAGNOSIS — E785 Hyperlipidemia, unspecified: Secondary | ICD-10-CM | POA: Diagnosis not present

## 2019-12-13 DIAGNOSIS — I252 Old myocardial infarction: Secondary | ICD-10-CM | POA: Diagnosis not present

## 2019-12-13 DIAGNOSIS — Z8673 Personal history of transient ischemic attack (TIA), and cerebral infarction without residual deficits: Secondary | ICD-10-CM | POA: Diagnosis not present

## 2019-12-13 DIAGNOSIS — Z87891 Personal history of nicotine dependence: Secondary | ICD-10-CM | POA: Diagnosis not present

## 2019-12-14 DIAGNOSIS — Z794 Long term (current) use of insulin: Secondary | ICD-10-CM | POA: Diagnosis not present

## 2019-12-14 DIAGNOSIS — D649 Anemia, unspecified: Secondary | ICD-10-CM | POA: Diagnosis not present

## 2019-12-14 DIAGNOSIS — E119 Type 2 diabetes mellitus without complications: Secondary | ICD-10-CM | POA: Diagnosis not present

## 2019-12-14 DIAGNOSIS — G9600 Cerebrospinal fluid leak, unspecified: Secondary | ICD-10-CM | POA: Diagnosis not present

## 2019-12-14 DIAGNOSIS — M720 Palmar fascial fibromatosis [Dupuytren]: Secondary | ICD-10-CM | POA: Diagnosis not present

## 2019-12-14 DIAGNOSIS — Z955 Presence of coronary angioplasty implant and graft: Secondary | ICD-10-CM | POA: Diagnosis not present

## 2019-12-14 DIAGNOSIS — E785 Hyperlipidemia, unspecified: Secondary | ICD-10-CM | POA: Diagnosis not present

## 2019-12-14 DIAGNOSIS — I252 Old myocardial infarction: Secondary | ICD-10-CM | POA: Diagnosis not present

## 2019-12-14 DIAGNOSIS — Z87891 Personal history of nicotine dependence: Secondary | ICD-10-CM | POA: Diagnosis not present

## 2019-12-14 DIAGNOSIS — M48061 Spinal stenosis, lumbar region without neurogenic claudication: Secondary | ICD-10-CM | POA: Diagnosis not present

## 2019-12-14 DIAGNOSIS — N390 Urinary tract infection, site not specified: Secondary | ICD-10-CM | POA: Diagnosis not present

## 2019-12-14 DIAGNOSIS — G8929 Other chronic pain: Secondary | ICD-10-CM | POA: Diagnosis not present

## 2019-12-14 DIAGNOSIS — I25118 Atherosclerotic heart disease of native coronary artery with other forms of angina pectoris: Secondary | ICD-10-CM | POA: Diagnosis not present

## 2019-12-14 DIAGNOSIS — J440 Chronic obstructive pulmonary disease with acute lower respiratory infection: Secondary | ICD-10-CM | POA: Diagnosis not present

## 2019-12-14 DIAGNOSIS — Z79899 Other long term (current) drug therapy: Secondary | ICD-10-CM | POA: Diagnosis not present

## 2019-12-14 DIAGNOSIS — M810 Age-related osteoporosis without current pathological fracture: Secondary | ICD-10-CM | POA: Diagnosis not present

## 2019-12-14 DIAGNOSIS — Z8673 Personal history of transient ischemic attack (TIA), and cerebral infarction without residual deficits: Secondary | ICD-10-CM | POA: Diagnosis not present

## 2019-12-14 DIAGNOSIS — I5022 Chronic systolic (congestive) heart failure: Secondary | ICD-10-CM | POA: Diagnosis not present

## 2019-12-14 DIAGNOSIS — I4510 Unspecified right bundle-branch block: Secondary | ICD-10-CM | POA: Diagnosis not present

## 2019-12-14 DIAGNOSIS — G002 Streptococcal meningitis: Secondary | ICD-10-CM | POA: Diagnosis not present

## 2019-12-14 DIAGNOSIS — I110 Hypertensive heart disease with heart failure: Secondary | ICD-10-CM | POA: Diagnosis not present

## 2019-12-14 DIAGNOSIS — Z86010 Personal history of colonic polyps: Secondary | ICD-10-CM | POA: Diagnosis not present

## 2019-12-19 ENCOUNTER — Other Ambulatory Visit: Payer: Self-pay | Admitting: Cardiovascular Disease

## 2019-12-20 DIAGNOSIS — J310 Chronic rhinitis: Secondary | ICD-10-CM | POA: Diagnosis not present

## 2019-12-25 DIAGNOSIS — I5022 Chronic systolic (congestive) heart failure: Secondary | ICD-10-CM | POA: Diagnosis not present

## 2019-12-25 DIAGNOSIS — G002 Streptococcal meningitis: Secondary | ICD-10-CM | POA: Diagnosis not present

## 2019-12-25 DIAGNOSIS — E785 Hyperlipidemia, unspecified: Secondary | ICD-10-CM | POA: Diagnosis not present

## 2019-12-25 DIAGNOSIS — M810 Age-related osteoporosis without current pathological fracture: Secondary | ICD-10-CM | POA: Diagnosis not present

## 2019-12-25 DIAGNOSIS — Z86010 Personal history of colonic polyps: Secondary | ICD-10-CM | POA: Diagnosis not present

## 2019-12-25 DIAGNOSIS — I4510 Unspecified right bundle-branch block: Secondary | ICD-10-CM | POA: Diagnosis not present

## 2019-12-25 DIAGNOSIS — J440 Chronic obstructive pulmonary disease with acute lower respiratory infection: Secondary | ICD-10-CM | POA: Diagnosis not present

## 2019-12-25 DIAGNOSIS — I110 Hypertensive heart disease with heart failure: Secondary | ICD-10-CM | POA: Diagnosis not present

## 2019-12-25 DIAGNOSIS — M48061 Spinal stenosis, lumbar region without neurogenic claudication: Secondary | ICD-10-CM | POA: Diagnosis not present

## 2019-12-25 DIAGNOSIS — G9600 Cerebrospinal fluid leak, unspecified: Secondary | ICD-10-CM | POA: Diagnosis not present

## 2019-12-25 DIAGNOSIS — I25118 Atherosclerotic heart disease of native coronary artery with other forms of angina pectoris: Secondary | ICD-10-CM | POA: Diagnosis not present

## 2019-12-25 DIAGNOSIS — M720 Palmar fascial fibromatosis [Dupuytren]: Secondary | ICD-10-CM | POA: Diagnosis not present

## 2019-12-25 DIAGNOSIS — I252 Old myocardial infarction: Secondary | ICD-10-CM | POA: Diagnosis not present

## 2019-12-25 DIAGNOSIS — Z79899 Other long term (current) drug therapy: Secondary | ICD-10-CM | POA: Diagnosis not present

## 2019-12-25 DIAGNOSIS — G8929 Other chronic pain: Secondary | ICD-10-CM | POA: Diagnosis not present

## 2019-12-25 DIAGNOSIS — E119 Type 2 diabetes mellitus without complications: Secondary | ICD-10-CM | POA: Diagnosis not present

## 2019-12-25 DIAGNOSIS — N390 Urinary tract infection, site not specified: Secondary | ICD-10-CM | POA: Diagnosis not present

## 2019-12-25 DIAGNOSIS — Z8673 Personal history of transient ischemic attack (TIA), and cerebral infarction without residual deficits: Secondary | ICD-10-CM | POA: Diagnosis not present

## 2019-12-25 DIAGNOSIS — Z87891 Personal history of nicotine dependence: Secondary | ICD-10-CM | POA: Diagnosis not present

## 2019-12-25 DIAGNOSIS — D649 Anemia, unspecified: Secondary | ICD-10-CM | POA: Diagnosis not present

## 2019-12-25 DIAGNOSIS — Z955 Presence of coronary angioplasty implant and graft: Secondary | ICD-10-CM | POA: Diagnosis not present

## 2019-12-25 DIAGNOSIS — Z794 Long term (current) use of insulin: Secondary | ICD-10-CM | POA: Diagnosis not present

## 2019-12-26 ENCOUNTER — Encounter: Payer: Self-pay | Admitting: Physician Assistant

## 2019-12-26 ENCOUNTER — Other Ambulatory Visit
Admission: RE | Admit: 2019-12-26 | Discharge: 2019-12-26 | Disposition: A | Discharge status: Home / Self Care | Payer: Medicare Other | Source: Home / Self Care | Attending: Physician Assistant | Admitting: Physician Assistant

## 2019-12-26 ENCOUNTER — Ambulatory Visit
Admission: RE | Admit: 2019-12-26 | Discharge: 2019-12-26 | Disposition: A | Discharge status: Home / Self Care | Payer: Medicare Other | Source: Ambulatory Visit | Attending: Physician Assistant | Admitting: Physician Assistant

## 2019-12-26 ENCOUNTER — Other Ambulatory Visit: Payer: Self-pay

## 2019-12-26 ENCOUNTER — Ambulatory Visit (INDEPENDENT_AMBULATORY_CARE_PROVIDER_SITE_OTHER): Payer: Medicare Other | Admitting: Physician Assistant

## 2019-12-26 VITALS — BP 105/50 | HR 112 | Temp 98.2°F | Resp 16 | Wt 150.2 lb

## 2019-12-26 DIAGNOSIS — R0602 Shortness of breath: Secondary | ICD-10-CM | POA: Insufficient documentation

## 2019-12-26 DIAGNOSIS — R000 Tachycardia, unspecified: Secondary | ICD-10-CM

## 2019-12-26 DIAGNOSIS — Z8661 Personal history of infections of the central nervous system: Secondary | ICD-10-CM | POA: Diagnosis not present

## 2019-12-26 DIAGNOSIS — G9601 Cranial cerebrospinal fluid leak, spontaneous: Secondary | ICD-10-CM

## 2019-12-26 DIAGNOSIS — R112 Nausea with vomiting, unspecified: Secondary | ICD-10-CM

## 2019-12-26 DIAGNOSIS — L304 Erythema intertrigo: Secondary | ICD-10-CM | POA: Diagnosis not present

## 2019-12-26 LAB — COMPREHENSIVE METABOLIC PANEL
ALT: 11 U/L (ref 0–44)
AST: 9 U/L — ABNORMAL LOW (ref 15–41)
Albumin: 2.7 g/dL — ABNORMAL LOW (ref 3.5–5.0)
Alkaline Phosphatase: 93 U/L (ref 38–126)
Anion gap: 12 (ref 5–15)
BUN: 69 mg/dL — ABNORMAL HIGH (ref 8–23)
CO2: 13 mmol/L — ABNORMAL LOW (ref 22–32)
Calcium: 7.9 mg/dL — ABNORMAL LOW (ref 8.9–10.3)
Chloride: 100 mmol/L (ref 98–111)
Creatinine, Ser: 2.37 mg/dL — ABNORMAL HIGH (ref 0.44–1.00)
GFR, Estimated: 20 mL/min — ABNORMAL LOW (ref >60)
Glucose, Bld: 496 mg/dL — ABNORMAL HIGH (ref 70–99)
Potassium: 4.7 mmol/L (ref 3.5–5.1)
Sodium: 125 mmol/L — ABNORMAL LOW (ref 135–145)
Total Bilirubin: 0.8 mg/dL (ref 0.3–1.2)
Total Protein: 6.1 g/dL — ABNORMAL LOW (ref 6.5–8.1)

## 2019-12-26 LAB — CBC WITH DIFFERENTIAL/PLATELET
Abs Immature Granulocytes: 0.04 10*3/uL (ref 0.00–0.07)
Basophils Absolute: 0 10*3/uL (ref 0.0–0.1)
Basophils Relative: 0 %
Eosinophils Absolute: 0 10*3/uL (ref 0.0–0.5)
Eosinophils Relative: 0 %
HCT: 32.5 % — ABNORMAL LOW (ref 36.0–46.0)
Hemoglobin: 10.6 g/dL — ABNORMAL LOW (ref 12.0–15.0)
Immature Granulocytes: 0 %
Lymphocytes Relative: 8 %
Lymphs Abs: 1 10*3/uL (ref 0.7–4.0)
MCH: 29.8 pg (ref 26.0–34.0)
MCHC: 32.6 g/dL (ref 30.0–36.0)
MCV: 91.3 fL (ref 80.0–100.0)
Monocytes Absolute: 0.8 10*3/uL (ref 0.1–1.0)
Monocytes Relative: 7 %
Neutro Abs: 9.9 10*3/uL — ABNORMAL HIGH (ref 1.7–7.7)
Neutrophils Relative %: 85 %
Platelets: 233 10*3/uL (ref 150–400)
RBC: 3.56 MIL/uL — ABNORMAL LOW (ref 3.87–5.11)
RDW: 14.6 % (ref 11.5–15.5)
WBC: 11.7 10*3/uL — ABNORMAL HIGH (ref 4.0–10.5)
nRBC: 0 % (ref 0.0–0.2)

## 2019-12-26 MED ORDER — ONDANSETRON HCL 4 MG PO TABS: 4.0000 mg | ORAL_TABLET | Freq: Three times a day (TID) | ORAL | 0 refills | Status: DC | PRN

## 2019-12-26 MED ORDER — NYSTATIN 100000 UNIT/GM EX POWD: 1.0000 "application " | Freq: Three times a day (TID) | CUTANEOUS | 0 refills | Status: DC

## 2019-12-26 NOTE — Patient Instructions (Signed)
Nausea, Adult Nausea is the feeling that you have an upset stomach or that you are about to vomit. Nausea on its own is not usually a serious concern, but it may be an early sign of a more serious medical problem. As nausea gets worse, it can lead to vomiting. If vomiting develops, or if you are not able to drink enough fluids, you are at risk of becoming dehydrated. Dehydration can make you tired and thirsty, cause you to have a dry mouth, and decrease how often you urinate. Older adults and people with other diseases or a weak disease-fighting system (immune system) are at higher risk for dehydration. The main goals of treating your nausea are:  To relieve your nausea.  To limit repeated nausea episodes.  To prevent vomiting and dehydration. Follow these instructions at home: Watch your symptoms for any changes. Tell your health care provider about them. Follow these instructions as told by your health care provider. Eating and drinking      Take an oral rehydration solution (ORS). This is a drink that is sold at pharmacies and retail stores.  Drink clear fluids slowly and in small amounts as you are able. Clear fluids include water, ice chips, low-calorie sports drinks, and fruit juice that has water added (diluted fruit juice).  Eat bland, easy-to-digest foods in small amounts as you are able. These foods include bananas, applesauce, rice, lean meats, toast, and crackers.  Avoid drinking fluids that contain a lot of sugar or caffeine, such as energy drinks, sports drinks, and soda.  Avoid alcohol.  Avoid spicy or fatty foods. General instructions  Take over-the-counter and prescription medicines only as told by your health care provider.  Rest at home while you recover.  Drink enough fluid to keep your urine pale yellow.  Breathe slowly and deeply when you feel nauseous.  Avoid smelling things that have strong odors.  Wash your hands often using soap and water. If soap and  water are not available, use hand sanitizer.  Make sure that all people in your household wash their hands well and often.  Keep all follow-up visits as told by your health care provider. This is important. Contact a health care provider if:  Your nausea gets worse.  Your nausea does not go away after two days.  You vomit.  You cannot drink fluids without vomiting.  You have any of the following: ? New symptoms. ? A fever. ? A headache. ? Muscle cramps. ? A rash. ? Pain while urinating.  You feel light-headed or dizzy. Get help right away if:  You have pain in your chest, neck, arm, or jaw.  You feel extremely weak or you faint.  You have vomit that is bright red or looks like coffee grounds.  You have bloody or black stools or stools that look like tar.  You have a severe headache, a stiff neck, or both.  You have severe pain, cramping, or bloating in your abdomen.  You have difficulty breathing or are breathing very quickly.  Your heart is beating very quickly.  Your skin feels cold and clammy.  You feel confused.  You have signs of dehydration, such as: ? Dark urine, very little urine, or no urine. ? Cracked lips. ? Dry mouth. ? Sunken eyes. ? Sleepiness. ? Weakness. These symptoms may represent a serious problem that is an emergency. Do not wait to see if the symptoms will go away. Get medical help right away. Call your local emergency services (911  in the [place] Do not drive yourself to the hospital. Summary  Nausea is the feeling that you have an upset stomach or that you are about to vomit. Nausea on its own is not usually a serious concern, but it may be an early sign of a more serious medical problem.  If vomiting develops, or if you are not able to drink enough fluids, you are at risk of becoming dehydrated.  Follow recommendations for eating and drinking and take over-the-counter and prescription medicines only as told by your health care  provider.  Contact a health care provider right away if your symptoms worsen or you have new symptoms.  Keep all follow-up visits as told by your health care provider. This is important. This information is not intended to replace advice given to you by your health care provider. Make sure you discuss any questions you have with your health care provider. Document Revised: 08/02/2017 Document Reviewed: 08/02/2017 Elsevier Patient Education  [place].

## 2019-12-26 NOTE — Progress Notes (Signed)
Established patient visit   Patient: Andrea [name]   DOB: 10/04/1945   74 y.o. Female  MRN: [id number] Visit Date: 12/26/2019  Today's healthcare provider: Mar Daring, PA-C   Chief Complaint  Patient presents with  . Hospitalization Follow-up   Subjective    HPI  Follow up Hospitalization  Patient was admitted to [facility] on 11/15/19 and transferred to [facility] 11/24/2019 due to meningitis, spesis and  CSF Leak. She was discharged on 12/01/19. She was re-admitted for surgery on 12/06/19 and discharged on 12/09/19. She was treated for Sepsis with encephalopathy without septic shock, Meningitis, CSF leak. Telephone follow up was not done. She reports excellent compliance with treatment.   She reports this condition is worsened. Per daughter she feels like the patient is worsened. She is not eating. She is drinking plenty of fluid. She has been vomiting for the past three days, but was able to keep some boost down since yesterday. No solids. She did improved after the first discharge before the surgery, after surgery patient is not right and has loss of smell after the surgery. She is able to taste. Has lost 10 pounds over the last month. Has not been taking her insulin due to not eating. Daughter reports her sugar yesterday was over 300.   ----------------------------------------------------------------------------------------- - Wt Readings from Last 3 Encounters:  12/26/19 150 lb 3.2 oz (68.1 kg)  11/15/19 160 lb 8 oz (72.8 kg)  10/30/19 160 lb 8 oz (72.8 kg)        Medications: Outpatient Medications Prior to Visit  Medication Sig  . Alcohol Swabs (ALCOHOL PREP) PADS Use twice daily prior to SQ injection of insulin to clean skin  . aspirin EC 81 MG tablet Take 81 mg daily by mouth.  [name] Kitchen atorvastatin (LIPITOR) 80 MG tablet TAKE 1 TABLET BY MOUTH EVERY DAY  . Calcium Carb-Cholecalciferol ([facility] CALCIUM 600+D) 600-800 MG-UNIT TABS Take 1 tablet by mouth in the morning and  at bedtime.   . Cyanocobalamin (B-12) 1000 MCG CAPS Take 1,000 mcg by mouth daily.   [name] Kitchen HYDROcodone-acetaminophen (NORCO) 10-325 MG tablet Take 1 tablet by mouth every 8 (eight) hours as needed. (Patient taking differently: Take 1 tablet by mouth 2 (two) times daily. )  . insulin glargine (LANTUS) 100 UNIT/ML injection INJECT 65 UNITS INTO THE SKIN TWICE DAILY WITH A MEAL (Patient taking differently: Inject 65 Units into the skin 2 (two) times daily. )  . INSULIN SYRINGE 1CC/29G 29G X 1/2" 1 ML MISC For lantus injections twice daily  . losartan (COZAAR) 25 MG tablet TAKE 1 TABLET BY MOUTH EVERY DAY  . metoprolol succinate (TOPROL-XL) 25 MG 24 hr tablet Take 1 tablet (25 mg total) by mouth daily. (Patient taking differently: Take 12.5 mg by mouth every morning. )  . omeprazole (PRILOSEC) 40 MG capsule TAKE 1 CAPSULE BY MOUTH EVERY DAY (Patient taking differently: Take 40 mg by mouth every morning. )  . OneTouch Delica Lancets 40X MISC To check blood sugar daily  DX: E11.9  . ONETOUCH VERIO test strip TO CHECK BLOOD SUGAR ONCE DAILY.  [name] Kitchen oxybutynin (DITROPAN-XL) 10 MG 24 hr tablet TAKE 1 TABLET BY MOUTH EVERYDAY AT BEDTIME (Patient taking differently: Take 10 mg by mouth every morning. )  . sodium chloride (OCEAN) 0.65 % nasal spray Use 2 sprays into each nostril five (5) times a day.  . TRULICITY 1.5 BD/5.3GD SOPN INJECT 1.5MG  INTO THE SKIN ONCE A WEEK AS DIRECTED (Patient taking differently: Inject  1.5 mg into the skin every Wednesday. )  . Vitamin D, Ergocalciferol, (DRISDOL) 1.25 MG (50000 UNIT) CAPS capsule TAKE ONE CAPSULE BY MOUTH ONCE WEEKLY ON THE SAME DAY EACH WEEK (ON FRIDAYS) (Patient taking differently: Take 50,000 Units by mouth every 7 (seven) days. Friday)  . [DISCONTINUED] sertraline (ZOLOFT) 25 MG tablet Take 25 mg by mouth daily.   No facility-administered medications prior to visit.    Review of Systems  Constitutional: Positive for activity change, appetite change, fatigue and  unexpected weight change.  HENT: Negative for congestion, postnasal drip and rhinorrhea.        Loss of smell and taste from surgery to repair CSF leak; was very close to olfactory nerve  Respiratory: Negative.   Cardiovascular: Negative.   Gastrointestinal: Positive for nausea. Negative for abdominal pain, constipation and diarrhea.  Neurological: Positive for weakness. Negative for headaches.       Objective    BP (!) 105/50 (BP Location: Left Arm, Patient Position: Sitting, Cuff Size: Large)   Pulse (!) 112   Temp 98.2 F (36.8 C) (Oral)   Resp 16   Wt 150 lb 3.2 oz (68.1 kg)   SpO2 99%   BMI 29.33 kg/m     Physical Exam Vitals reviewed.  Constitutional:      General: She is not in acute distress.    Appearance: She is well-developed. She is ill-appearing. She is not diaphoretic.  HENT:     Head: Normocephalic and atraumatic.  Neck:     Thyroid: No thyromegaly.     Vascular: No JVD.     Trachea: No tracheal deviation.  Cardiovascular:     Rate and Rhythm: Regular rhythm. Tachycardia present.     Heart sounds: Normal heart sounds. No murmur heard.  No friction rub. No gallop.   Pulmonary:     Effort: Pulmonary effort is normal. No respiratory distress.     Breath sounds: Normal breath sounds. No wheezing or rales.  Musculoskeletal:     Cervical back: Normal range of motion and neck supple. No tenderness.  Lymphadenopathy:     Cervical: No cervical adenopathy.  Skin:    General: Skin is warm and dry.  Neurological:     Mental Status: She is alert.  Psychiatric:        Behavior: Behavior is cooperative.     Results for orders placed or performed during the hospital encounter of 12/26/19  CBC with Differential/Platelet  Result Value Ref Range   WBC 11.7 (H) 4.0 - 10.5 K/uL   RBC 3.56 (L) 3.87 - 5.11 MIL/uL   Hemoglobin 10.6 (L) 12.0 - 15.0 g/dL   HCT 32.5 (L) 36 - 46 %   MCV 91.3 80.0 - 100.0 fL   MCH 29.8 26.0 - 34.0 pg   MCHC 32.6 30.0 - 36.0 g/dL    RDW 14.6 11.5 - 15.5 %   Platelets 233 150 - 400 K/uL   nRBC 0.0 0.0 - 0.2 %   Neutrophils Relative % 85 %   Neutro Abs 9.9 (H) 1.7 - 7.7 K/uL   Lymphocytes Relative 8 %   Lymphs Abs 1.0 0.7 - 4.0 K/uL   Monocytes Relative 7 %   Monocytes Absolute 0.8 0.1 - 1.0 K/uL   Eosinophils Relative 0 %   Eosinophils Absolute 0.0 0.0 - 0.5 K/uL   Basophils Relative 0 %   Basophils Absolute 0.0 0.0 - 0.1 K/uL   Immature Granulocytes 0 %   Abs Immature Granulocytes 0.04 0.00 -  0.07 K/uL  Results for orders placed or performed during the hospital encounter of 12/26/19  Comprehensive metabolic panel  Result Value Ref Range   Sodium 125 (L) 135 - 145 mmol/L   Potassium 4.7 3.5 - 5.1 mmol/L   Chloride 100 98 - 111 mmol/L   CO2 13 (L) 22 - 32 mmol/L   Glucose, Bld 496 (H) 70 - 99 mg/dL   BUN 69 (H) 8 - 23 mg/dL   Creatinine, Ser 2.37 (H) 0.44 - 1.00 mg/dL   Calcium 7.9 (L) 8.9 - 10.3 mg/dL   Total Protein 6.1 (L) 6.5 - 8.1 g/dL   Albumin 2.7 (L) 3.5 - 5.0 g/dL   AST 9 (L) 15 - 41 U/L   ALT 11 0 - 44 U/L   Alkaline Phosphatase 93 38 - 126 U/L   Total Bilirubin 0.8 0.3 - 1.2 mg/dL   GFR, Estimated 20 (L) >60 mL/min   Anion gap 12 5 - 15    Assessment & Plan     1. SOB (shortness of breath) EKG shows no true change from previous EKGs on file. Will get labs and CXR as below and f/u pending results. Worried for dehydration, early DKA, and/or possible secondary pneumonia. Patient has been declining since her most recent discharge. Advised if patient worsens may need to take to ER. Advised to try to push fluids. Eat soft, bland diet and increase as tolerated. May have to force to eat a few bites. Will give zofran for the nausea to try to increase oral intake. Discussed ok to use protein shakes like Boost or Ensure (sugar free) to try to get nutrients back in. I will f/u pending results.  - EKG 12-Lead - CBC w/Diff/Platelet - Comprehensive Metabolic Panel (CMET) - DG Chest 2 View; Future  2.  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 - CBC w/Diff/Platelet - Comprehensive Metabolic Panel (CMET)  4. Intertrigo Noted when doing EKG today. Will give Nystatin powder for that.  - nystatin (MYCOSTATIN/NYSTOP) powder; Apply 1 application topically 3 (three) times daily.  Dispense: 60 g; Refill: 0 - CBC w/Diff/Platelet - Comprehensive Metabolic Panel (CMET)  5. Intractable vomiting with nausea, unspecified vomiting type See above medical treatment plan for #1.  - ondansetron (ZOFRAN) 4 MG tablet; Take 1 tablet (4 mg total) by mouth every 8 (eight) hours as needed.  Dispense: 20 tablet; Refill: 0 - CBC w/Diff/Platelet - Comprehensive Metabolic Panel (CMET)  6. Tachycardia Low BP with elevated HR and SOB. EKG unremarkable compared to previous with exception of tachycardia. Will be checking labs. Reinforced her being in a complex situation that may require rehospitalization.  - CBC w/Diff/Platelet - Comprehensive Metabolic Panel (CMET)   No follow-ups on file.      Reynolds Bowl, PA-C, have reviewed all documentation for this visit. The documentation on 12/27/19 for the exam, diagnosis, procedures, and orders are all accurate and complete.   Rubye Beach  [facility] [phone] (phone) [phone] (fax)  [place]

## 2019-12-27 ENCOUNTER — Telehealth: Payer: Self-pay

## 2019-12-27 ENCOUNTER — Encounter: Payer: Self-pay | Admitting: Physician Assistant

## 2019-12-27 ENCOUNTER — Ambulatory Visit: Payer: Self-pay

## 2019-12-27 DIAGNOSIS — L304 Erythema intertrigo: Secondary | ICD-10-CM | POA: Insufficient documentation

## 2019-12-27 DIAGNOSIS — R0602 Shortness of breath: Secondary | ICD-10-CM | POA: Diagnosis not present

## 2019-12-27 DIAGNOSIS — R112 Nausea with vomiting, unspecified: Secondary | ICD-10-CM | POA: Diagnosis not present

## 2019-12-27 DIAGNOSIS — I252 Old myocardial infarction: Secondary | ICD-10-CM | POA: Diagnosis not present

## 2019-12-27 DIAGNOSIS — E871 Hypo-osmolality and hyponatremia: Secondary | ICD-10-CM | POA: Diagnosis not present

## 2019-12-27 DIAGNOSIS — R918 Other nonspecific abnormal finding of lung field: Secondary | ICD-10-CM | POA: Diagnosis not present

## 2019-12-27 DIAGNOSIS — I2510 Atherosclerotic heart disease of native coronary artery without angina pectoris: Secondary | ICD-10-CM | POA: Diagnosis not present

## 2019-12-27 DIAGNOSIS — R197 Diarrhea, unspecified: Secondary | ICD-10-CM | POA: Diagnosis not present

## 2019-12-27 DIAGNOSIS — R5383 Other fatigue: Secondary | ICD-10-CM | POA: Diagnosis not present

## 2019-12-27 DIAGNOSIS — J329 Chronic sinusitis, unspecified: Secondary | ICD-10-CM | POA: Diagnosis not present

## 2019-12-27 DIAGNOSIS — M810 Age-related osteoporosis without current pathological fracture: Secondary | ICD-10-CM | POA: Diagnosis not present

## 2019-12-27 DIAGNOSIS — Z87891 Personal history of nicotine dependence: Secondary | ICD-10-CM | POA: Diagnosis not present

## 2019-12-27 DIAGNOSIS — J019 Acute sinusitis, unspecified: Secondary | ICD-10-CM | POA: Diagnosis not present

## 2019-12-27 DIAGNOSIS — Z882 Allergy status to sulfonamides status: Secondary | ICD-10-CM | POA: Diagnosis not present

## 2019-12-27 DIAGNOSIS — E440 Moderate protein-calorie malnutrition: Secondary | ICD-10-CM | POA: Diagnosis not present

## 2019-12-27 DIAGNOSIS — E860 Dehydration: Secondary | ICD-10-CM | POA: Diagnosis not present

## 2019-12-27 DIAGNOSIS — Z794 Long term (current) use of insulin: Secondary | ICD-10-CM | POA: Diagnosis not present

## 2019-12-27 DIAGNOSIS — J449 Chronic obstructive pulmonary disease, unspecified: Secondary | ICD-10-CM | POA: Diagnosis not present

## 2019-12-27 DIAGNOSIS — N3000 Acute cystitis without hematuria: Secondary | ICD-10-CM | POA: Diagnosis not present

## 2019-12-27 DIAGNOSIS — E1165 Type 2 diabetes mellitus with hyperglycemia: Secondary | ICD-10-CM | POA: Diagnosis not present

## 2019-12-27 DIAGNOSIS — N179 Acute kidney failure, unspecified: Secondary | ICD-10-CM | POA: Diagnosis not present

## 2019-12-27 DIAGNOSIS — R059 Cough, unspecified: Secondary | ICD-10-CM | POA: Diagnosis not present

## 2019-12-27 DIAGNOSIS — R5381 Other malaise: Secondary | ICD-10-CM | POA: Diagnosis not present

## 2019-12-27 DIAGNOSIS — D649 Anemia, unspecified: Secondary | ICD-10-CM | POA: Diagnosis not present

## 2019-12-27 NOTE — Telephone Encounter (Signed)
Copied from [place] [id number]. Topic: General - Call Back - No Documentation >> Dec 27, 2019 10:48 AM Erick Blinks wrote: Reason for CRM: [place] (daughter) called and would like a call back from clinic, she is concerned with her current health status. SOB mentioned - warm transferred to triage.

## 2019-12-27 NOTE — Telephone Encounter (Signed)
See previous phone note.  Pt's daughter was advised to take Ms. Olveda to the ER.   Thanks,   -Mickel Baas

## 2019-12-27 NOTE — Telephone Encounter (Signed)
Pt's daughter advised.  She will be taking Ms Piltz to the ER.   Thanks,   -Mickel Baas

## 2019-12-27 NOTE — Telephone Encounter (Signed)
-----   Message from Mar Daring, [place] sent at 12/27/2019 11:54 AM EDT ----- Blood count does show some signs of infection. Her WBC count has increased again and her Neutrophils are up as well. They resulted the metabolic panel separately so I will address concerns for that on there.

## 2019-12-27 NOTE — Telephone Encounter (Addendum)
This is Andrea [name]'s patient. Please review patient's lab results. Some of her results are out of range and one of the daughters is calling for lab results. We are still waiting on Medical records to locate the updated DPR from yesterday.

## 2019-12-27 NOTE — Telephone Encounter (Signed)
-----   Message from Mar Daring, [place] sent at 12/27/2019 11:58 AM EDT ----- So these labs are much more concerning. Her sodium is quite low. Her sugar is elevated at 496, which is very worrisome to me. Also with seeing her kidney function has declined quite drastically indicating an acute kidney injury. With these findings and her demeanor and strength I am very worried about early diabetic ketoacidosis and/or severe dehydration. Along with the elevated WBC count and neutrophils I feel she needs to return to the ER. I hate to say that, but we really need to get her sugar down safely, get some fluids in her and protect her kidneys. Please call [name], her daughter with results.

## 2019-12-27 NOTE — Telephone Encounter (Signed)
PCP checks messages and labs/results on admin time.  Will forward

## 2019-12-27 NOTE — Telephone Encounter (Signed)
Patient daughter Dorma Russell called stating that her mother was in office yesterday with her sister who is listed on DPR.  She is callings and states that her sister should have given at paper stating that she can get information on her mother.  She states tht she has seen some of the lab work and is concerned and needs to know what this means and does she need to take her mother to the hospital for care. Ms Zenaida Niece is not with her mother at this time. I advised that I was unable to review labs with her.  If she feels her mother is in need of immediate care she should get her to the nearest ER for evaluation. Ms Zenaida Niece was told that her sister(on the DPR) would need to call for the results. Ms Beem per her request was transferred to office to [place] for connecting to medical records.

## 2019-12-28 DIAGNOSIS — N390 Urinary tract infection, site not specified: Secondary | ICD-10-CM | POA: Insufficient documentation

## 2019-12-28 DIAGNOSIS — N179 Acute kidney failure, unspecified: Secondary | ICD-10-CM | POA: Diagnosis not present

## 2019-12-28 DIAGNOSIS — R5381 Other malaise: Secondary | ICD-10-CM | POA: Insufficient documentation

## 2019-12-28 DIAGNOSIS — R5383 Other fatigue: Secondary | ICD-10-CM | POA: Insufficient documentation

## 2019-12-28 DIAGNOSIS — E1165 Type 2 diabetes mellitus with hyperglycemia: Secondary | ICD-10-CM | POA: Diagnosis not present

## 2019-12-28 DIAGNOSIS — B379 Candidiasis, unspecified: Secondary | ICD-10-CM | POA: Diagnosis not present

## 2019-12-29 DIAGNOSIS — B379 Candidiasis, unspecified: Secondary | ICD-10-CM | POA: Diagnosis not present

## 2019-12-29 DIAGNOSIS — E1165 Type 2 diabetes mellitus with hyperglycemia: Secondary | ICD-10-CM | POA: Diagnosis not present

## 2019-12-29 DIAGNOSIS — N179 Acute kidney failure, unspecified: Secondary | ICD-10-CM | POA: Diagnosis not present

## 2019-12-29 DIAGNOSIS — N3000 Acute cystitis without hematuria: Secondary | ICD-10-CM | POA: Diagnosis not present

## 2020-01-02 DIAGNOSIS — I25118 Atherosclerotic heart disease of native coronary artery with other forms of angina pectoris: Secondary | ICD-10-CM | POA: Diagnosis not present

## 2020-01-02 DIAGNOSIS — M720 Palmar fascial fibromatosis [Dupuytren]: Secondary | ICD-10-CM | POA: Diagnosis not present

## 2020-01-02 DIAGNOSIS — G002 Streptococcal meningitis: Secondary | ICD-10-CM | POA: Diagnosis not present

## 2020-01-02 DIAGNOSIS — E785 Hyperlipidemia, unspecified: Secondary | ICD-10-CM | POA: Diagnosis not present

## 2020-01-02 DIAGNOSIS — I5022 Chronic systolic (congestive) heart failure: Secondary | ICD-10-CM | POA: Diagnosis not present

## 2020-01-02 DIAGNOSIS — G9600 Cerebrospinal fluid leak, unspecified: Secondary | ICD-10-CM | POA: Diagnosis not present

## 2020-01-02 DIAGNOSIS — Z955 Presence of coronary angioplasty implant and graft: Secondary | ICD-10-CM | POA: Diagnosis not present

## 2020-01-02 DIAGNOSIS — E119 Type 2 diabetes mellitus without complications: Secondary | ICD-10-CM | POA: Diagnosis not present

## 2020-01-02 DIAGNOSIS — Z8673 Personal history of transient ischemic attack (TIA), and cerebral infarction without residual deficits: Secondary | ICD-10-CM | POA: Diagnosis not present

## 2020-01-02 DIAGNOSIS — I4510 Unspecified right bundle-branch block: Secondary | ICD-10-CM | POA: Diagnosis not present

## 2020-01-02 DIAGNOSIS — Z794 Long term (current) use of insulin: Secondary | ICD-10-CM | POA: Diagnosis not present

## 2020-01-02 DIAGNOSIS — Z86010 Personal history of colonic polyps: Secondary | ICD-10-CM | POA: Diagnosis not present

## 2020-01-02 DIAGNOSIS — Z87891 Personal history of nicotine dependence: Secondary | ICD-10-CM | POA: Diagnosis not present

## 2020-01-02 DIAGNOSIS — N390 Urinary tract infection, site not specified: Secondary | ICD-10-CM | POA: Diagnosis not present

## 2020-01-02 DIAGNOSIS — I110 Hypertensive heart disease with heart failure: Secondary | ICD-10-CM | POA: Diagnosis not present

## 2020-01-02 DIAGNOSIS — J440 Chronic obstructive pulmonary disease with acute lower respiratory infection: Secondary | ICD-10-CM | POA: Diagnosis not present

## 2020-01-02 DIAGNOSIS — G8929 Other chronic pain: Secondary | ICD-10-CM | POA: Diagnosis not present

## 2020-01-02 DIAGNOSIS — I252 Old myocardial infarction: Secondary | ICD-10-CM | POA: Diagnosis not present

## 2020-01-02 DIAGNOSIS — Z79899 Other long term (current) drug therapy: Secondary | ICD-10-CM | POA: Diagnosis not present

## 2020-01-02 DIAGNOSIS — M48061 Spinal stenosis, lumbar region without neurogenic claudication: Secondary | ICD-10-CM | POA: Diagnosis not present

## 2020-01-02 DIAGNOSIS — M810 Age-related osteoporosis without current pathological fracture: Secondary | ICD-10-CM | POA: Diagnosis not present

## 2020-01-02 DIAGNOSIS — D649 Anemia, unspecified: Secondary | ICD-10-CM | POA: Diagnosis not present

## 2020-01-09 ENCOUNTER — Ambulatory Visit: Payer: Medicare Other | Admitting: Physician Assistant

## 2020-01-09 DIAGNOSIS — E785 Hyperlipidemia, unspecified: Secondary | ICD-10-CM | POA: Diagnosis not present

## 2020-01-09 DIAGNOSIS — I110 Hypertensive heart disease with heart failure: Secondary | ICD-10-CM | POA: Diagnosis not present

## 2020-01-09 DIAGNOSIS — I4510 Unspecified right bundle-branch block: Secondary | ICD-10-CM | POA: Diagnosis not present

## 2020-01-09 DIAGNOSIS — G8929 Other chronic pain: Secondary | ICD-10-CM | POA: Diagnosis not present

## 2020-01-09 DIAGNOSIS — G002 Streptococcal meningitis: Secondary | ICD-10-CM | POA: Diagnosis not present

## 2020-01-09 DIAGNOSIS — I25118 Atherosclerotic heart disease of native coronary artery with other forms of angina pectoris: Secondary | ICD-10-CM | POA: Diagnosis not present

## 2020-01-09 DIAGNOSIS — Z86010 Personal history of colonic polyps: Secondary | ICD-10-CM | POA: Diagnosis not present

## 2020-01-09 DIAGNOSIS — D649 Anemia, unspecified: Secondary | ICD-10-CM | POA: Diagnosis not present

## 2020-01-09 DIAGNOSIS — M48061 Spinal stenosis, lumbar region without neurogenic claudication: Secondary | ICD-10-CM | POA: Diagnosis not present

## 2020-01-09 DIAGNOSIS — G9600 Cerebrospinal fluid leak, unspecified: Secondary | ICD-10-CM | POA: Diagnosis not present

## 2020-01-09 DIAGNOSIS — Z955 Presence of coronary angioplasty implant and graft: Secondary | ICD-10-CM | POA: Diagnosis not present

## 2020-01-09 DIAGNOSIS — Z79899 Other long term (current) drug therapy: Secondary | ICD-10-CM | POA: Diagnosis not present

## 2020-01-09 DIAGNOSIS — Z87891 Personal history of nicotine dependence: Secondary | ICD-10-CM | POA: Diagnosis not present

## 2020-01-09 DIAGNOSIS — J440 Chronic obstructive pulmonary disease with acute lower respiratory infection: Secondary | ICD-10-CM | POA: Diagnosis not present

## 2020-01-09 DIAGNOSIS — I252 Old myocardial infarction: Secondary | ICD-10-CM | POA: Diagnosis not present

## 2020-01-09 DIAGNOSIS — I5022 Chronic systolic (congestive) heart failure: Secondary | ICD-10-CM | POA: Diagnosis not present

## 2020-01-09 DIAGNOSIS — Z794 Long term (current) use of insulin: Secondary | ICD-10-CM | POA: Diagnosis not present

## 2020-01-09 DIAGNOSIS — Z8673 Personal history of transient ischemic attack (TIA), and cerebral infarction without residual deficits: Secondary | ICD-10-CM | POA: Diagnosis not present

## 2020-01-09 DIAGNOSIS — E119 Type 2 diabetes mellitus without complications: Secondary | ICD-10-CM | POA: Diagnosis not present

## 2020-01-09 DIAGNOSIS — N390 Urinary tract infection, site not specified: Secondary | ICD-10-CM | POA: Diagnosis not present

## 2020-01-09 DIAGNOSIS — M720 Palmar fascial fibromatosis [Dupuytren]: Secondary | ICD-10-CM | POA: Diagnosis not present

## 2020-01-09 DIAGNOSIS — M810 Age-related osteoporosis without current pathological fracture: Secondary | ICD-10-CM | POA: Diagnosis not present

## 2020-01-14 ENCOUNTER — Telehealth: Payer: Self-pay

## 2020-01-14 ENCOUNTER — Other Ambulatory Visit: Payer: Self-pay | Admitting: Physician Assistant

## 2020-01-14 DIAGNOSIS — M4316 Spondylolisthesis, lumbar region: Secondary | ICD-10-CM

## 2020-01-14 MED ORDER — HYDROCODONE-ACETAMINOPHEN 10-325 MG PO TABS: 1.0000 | ORAL_TABLET | Freq: Three times a day (TID) | ORAL | 0 refills | Status: DC | PRN

## 2020-01-14 NOTE — Telephone Encounter (Signed)
Apt rescheduled for 02/11/2020

## 2020-01-14 NOTE — Telephone Encounter (Signed)
Copied from [place] [id number]. Topic: General - Other >> Jan 14, 2020 10:52 AM Keene Breath wrote: Reason for CRM: Patient called to ask if her AWV could be rescheduled to 11/17 because she has a conflict.  Please call to confirm  [phone]

## 2020-01-14 NOTE — Telephone Encounter (Signed)
Patient requesting HYDROcodone-acetaminophen ([place]) 10-325 MG tablet , informed please allow 48 to 72 hour turn around time   [id number] [place] [place] Phone:  [phone]  Fax:  [phone]

## 2020-01-14 NOTE — Telephone Encounter (Signed)
Requested medication (s) are due for refill today: yes  Requested medication (s) are on the active medication list: yes  Last refill: 10/15/19  #90  0 refills  Future visit scheduled  Yes 02/11/20  Notes to clinic:not delegated  Requested Prescriptions  Pending Prescriptions Disp Refills   HYDROcodone-acetaminophen (NORCO) 10-325 MG tablet 90 tablet 0    Sig: Take 1 tablet by mouth every 8 (eight) hours as needed.      Not Delegated - Analgesics:  Opioid Agonist Combinations Failed - 01/14/2020 12:15 PM      Failed - This refill cannot be delegated      Failed - Urine Drug Screen completed in last 360 days      Passed - Valid encounter within last 6 months    Recent Outpatient Visits           2 weeks ago SOB (shortness of breath)   [facility], [place], PA-C   3 months ago 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 edema, with long-term current use of insulin [facility])   [place], [place], PA-C   6 months ago Type 2 diabetes mellitus with diabetic peripheral angiopathy without gangrene, with long-term current use of insulin [facility])   [facility], [place], [place]   9 months ago Type 2 diabetes mellitus with diabetic peripheral angiopathy without gangrene, with long-term current use of insulin [facility])   [facility] [place], Clearnce Sorrel, [place]   1 year ago Annual physical exam   [facility] [name] M, [place]

## 2020-01-15 DIAGNOSIS — Z87891 Personal history of nicotine dependence: Secondary | ICD-10-CM | POA: Diagnosis not present

## 2020-01-15 DIAGNOSIS — G9600 Cerebrospinal fluid leak, unspecified: Secondary | ICD-10-CM | POA: Diagnosis not present

## 2020-01-15 DIAGNOSIS — D649 Anemia, unspecified: Secondary | ICD-10-CM | POA: Diagnosis not present

## 2020-01-15 DIAGNOSIS — E119 Type 2 diabetes mellitus without complications: Secondary | ICD-10-CM | POA: Diagnosis not present

## 2020-01-15 DIAGNOSIS — J440 Chronic obstructive pulmonary disease with acute lower respiratory infection: Secondary | ICD-10-CM | POA: Diagnosis not present

## 2020-01-15 DIAGNOSIS — I4510 Unspecified right bundle-branch block: Secondary | ICD-10-CM | POA: Diagnosis not present

## 2020-01-15 DIAGNOSIS — I25118 Atherosclerotic heart disease of native coronary artery with other forms of angina pectoris: Secondary | ICD-10-CM | POA: Diagnosis not present

## 2020-01-15 DIAGNOSIS — I5022 Chronic systolic (congestive) heart failure: Secondary | ICD-10-CM | POA: Diagnosis not present

## 2020-01-15 DIAGNOSIS — Z955 Presence of coronary angioplasty implant and graft: Secondary | ICD-10-CM | POA: Diagnosis not present

## 2020-01-15 DIAGNOSIS — M810 Age-related osteoporosis without current pathological fracture: Secondary | ICD-10-CM | POA: Diagnosis not present

## 2020-01-15 DIAGNOSIS — N390 Urinary tract infection, site not specified: Secondary | ICD-10-CM | POA: Diagnosis not present

## 2020-01-15 DIAGNOSIS — G8929 Other chronic pain: Secondary | ICD-10-CM | POA: Diagnosis not present

## 2020-01-15 DIAGNOSIS — M48061 Spinal stenosis, lumbar region without neurogenic claudication: Secondary | ICD-10-CM | POA: Diagnosis not present

## 2020-01-15 DIAGNOSIS — I252 Old myocardial infarction: Secondary | ICD-10-CM | POA: Diagnosis not present

## 2020-01-15 DIAGNOSIS — E785 Hyperlipidemia, unspecified: Secondary | ICD-10-CM | POA: Diagnosis not present

## 2020-01-15 DIAGNOSIS — Z79899 Other long term (current) drug therapy: Secondary | ICD-10-CM | POA: Diagnosis not present

## 2020-01-15 DIAGNOSIS — Z794 Long term (current) use of insulin: Secondary | ICD-10-CM | POA: Diagnosis not present

## 2020-01-15 DIAGNOSIS — G002 Streptococcal meningitis: Secondary | ICD-10-CM | POA: Diagnosis not present

## 2020-01-15 DIAGNOSIS — M720 Palmar fascial fibromatosis [Dupuytren]: Secondary | ICD-10-CM | POA: Diagnosis not present

## 2020-01-15 DIAGNOSIS — I110 Hypertensive heart disease with heart failure: Secondary | ICD-10-CM | POA: Diagnosis not present

## 2020-01-15 DIAGNOSIS — Z8673 Personal history of transient ischemic attack (TIA), and cerebral infarction without residual deficits: Secondary | ICD-10-CM | POA: Diagnosis not present

## 2020-01-15 DIAGNOSIS — Z86010 Personal history of colonic polyps: Secondary | ICD-10-CM | POA: Diagnosis not present

## 2020-01-17 ENCOUNTER — Encounter: Payer: Medicare Other | Admitting: Physician Assistant

## 2020-01-18 ENCOUNTER — Encounter: Payer: Medicare Other | Admitting: Physician Assistant

## 2020-01-20 ENCOUNTER — Other Ambulatory Visit: Payer: Self-pay | Admitting: Physician Assistant

## 2020-01-20 DIAGNOSIS — E559 Vitamin D deficiency, unspecified: Secondary | ICD-10-CM

## 2020-01-20 NOTE — Telephone Encounter (Signed)
Requested medications are due for refill today?  Yes  - 50,000 IU strengths cannot be delegated.    Requested medications are on active medication list?  Yes  Last Refill:   08/07/2019  # 12 with one refill  Future visit scheduled?  No   Notes to Clinic:  50,000 IU strengths cannot be delegated.

## 2020-01-21 DIAGNOSIS — Z794 Long term (current) use of insulin: Secondary | ICD-10-CM | POA: Diagnosis not present

## 2020-01-21 DIAGNOSIS — I25118 Atherosclerotic heart disease of native coronary artery with other forms of angina pectoris: Secondary | ICD-10-CM | POA: Diagnosis not present

## 2020-01-21 DIAGNOSIS — M720 Palmar fascial fibromatosis [Dupuytren]: Secondary | ICD-10-CM | POA: Diagnosis not present

## 2020-01-21 DIAGNOSIS — M810 Age-related osteoporosis without current pathological fracture: Secondary | ICD-10-CM | POA: Diagnosis not present

## 2020-01-21 DIAGNOSIS — E785 Hyperlipidemia, unspecified: Secondary | ICD-10-CM | POA: Diagnosis not present

## 2020-01-21 DIAGNOSIS — I110 Hypertensive heart disease with heart failure: Secondary | ICD-10-CM | POA: Diagnosis not present

## 2020-01-21 DIAGNOSIS — D649 Anemia, unspecified: Secondary | ICD-10-CM | POA: Diagnosis not present

## 2020-01-21 DIAGNOSIS — G002 Streptococcal meningitis: Secondary | ICD-10-CM | POA: Diagnosis not present

## 2020-01-21 DIAGNOSIS — Z87891 Personal history of nicotine dependence: Secondary | ICD-10-CM | POA: Diagnosis not present

## 2020-01-21 DIAGNOSIS — E119 Type 2 diabetes mellitus without complications: Secondary | ICD-10-CM | POA: Diagnosis not present

## 2020-01-21 DIAGNOSIS — Z86010 Personal history of colonic polyps: Secondary | ICD-10-CM | POA: Diagnosis not present

## 2020-01-21 DIAGNOSIS — Z8673 Personal history of transient ischemic attack (TIA), and cerebral infarction without residual deficits: Secondary | ICD-10-CM | POA: Diagnosis not present

## 2020-01-21 DIAGNOSIS — M48061 Spinal stenosis, lumbar region without neurogenic claudication: Secondary | ICD-10-CM | POA: Diagnosis not present

## 2020-01-21 DIAGNOSIS — Z955 Presence of coronary angioplasty implant and graft: Secondary | ICD-10-CM | POA: Diagnosis not present

## 2020-01-21 DIAGNOSIS — G8929 Other chronic pain: Secondary | ICD-10-CM | POA: Diagnosis not present

## 2020-01-21 DIAGNOSIS — I252 Old myocardial infarction: Secondary | ICD-10-CM | POA: Diagnosis not present

## 2020-01-21 DIAGNOSIS — N390 Urinary tract infection, site not specified: Secondary | ICD-10-CM | POA: Diagnosis not present

## 2020-01-21 DIAGNOSIS — I5022 Chronic systolic (congestive) heart failure: Secondary | ICD-10-CM | POA: Diagnosis not present

## 2020-01-21 DIAGNOSIS — G9600 Cerebrospinal fluid leak, unspecified: Secondary | ICD-10-CM | POA: Diagnosis not present

## 2020-01-21 DIAGNOSIS — J440 Chronic obstructive pulmonary disease with acute lower respiratory infection: Secondary | ICD-10-CM | POA: Diagnosis not present

## 2020-01-21 DIAGNOSIS — I4510 Unspecified right bundle-branch block: Secondary | ICD-10-CM | POA: Diagnosis not present

## 2020-01-21 DIAGNOSIS — Z79899 Other long term (current) drug therapy: Secondary | ICD-10-CM | POA: Diagnosis not present

## 2020-01-23 ENCOUNTER — Other Ambulatory Visit: Payer: Self-pay

## 2020-01-23 ENCOUNTER — Ambulatory Visit
Admission: RE | Admit: 2020-01-23 | Discharge: 2020-01-23 | Disposition: A | Discharge status: Home / Self Care | Payer: Medicare Other | Source: Ambulatory Visit | Attending: Physician Assistant | Admitting: Physician Assistant

## 2020-01-23 ENCOUNTER — Other Ambulatory Visit: Payer: Self-pay | Admitting: Physician Assistant

## 2020-01-23 DIAGNOSIS — Z1231 Encounter for screening mammogram for malignant neoplasm of breast: Secondary | ICD-10-CM | POA: Insufficient documentation

## 2020-01-23 DIAGNOSIS — N3281 Overactive bladder: Secondary | ICD-10-CM

## 2020-01-23 DIAGNOSIS — Z1239 Encounter for other screening for malignant neoplasm of breast: Secondary | ICD-10-CM

## 2020-01-27 ENCOUNTER — Other Ambulatory Visit: Payer: Self-pay | Admitting: Physician Assistant

## 2020-01-27 DIAGNOSIS — K219 Gastro-esophageal reflux disease without esophagitis: Secondary | ICD-10-CM

## 2020-01-29 DIAGNOSIS — G002 Streptococcal meningitis: Secondary | ICD-10-CM | POA: Diagnosis not present

## 2020-01-29 DIAGNOSIS — Z79899 Other long term (current) drug therapy: Secondary | ICD-10-CM | POA: Diagnosis not present

## 2020-01-29 DIAGNOSIS — I4510 Unspecified right bundle-branch block: Secondary | ICD-10-CM | POA: Diagnosis not present

## 2020-01-29 DIAGNOSIS — E119 Type 2 diabetes mellitus without complications: Secondary | ICD-10-CM | POA: Diagnosis not present

## 2020-01-29 DIAGNOSIS — G9600 Cerebrospinal fluid leak, unspecified: Secondary | ICD-10-CM | POA: Diagnosis not present

## 2020-01-29 DIAGNOSIS — M48061 Spinal stenosis, lumbar region without neurogenic claudication: Secondary | ICD-10-CM | POA: Diagnosis not present

## 2020-01-29 DIAGNOSIS — I25118 Atherosclerotic heart disease of native coronary artery with other forms of angina pectoris: Secondary | ICD-10-CM | POA: Diagnosis not present

## 2020-01-29 DIAGNOSIS — G8929 Other chronic pain: Secondary | ICD-10-CM | POA: Diagnosis not present

## 2020-01-29 DIAGNOSIS — J440 Chronic obstructive pulmonary disease with acute lower respiratory infection: Secondary | ICD-10-CM | POA: Diagnosis not present

## 2020-01-29 DIAGNOSIS — I252 Old myocardial infarction: Secondary | ICD-10-CM | POA: Diagnosis not present

## 2020-01-29 DIAGNOSIS — M720 Palmar fascial fibromatosis [Dupuytren]: Secondary | ICD-10-CM | POA: Diagnosis not present

## 2020-01-29 DIAGNOSIS — I110 Hypertensive heart disease with heart failure: Secondary | ICD-10-CM | POA: Diagnosis not present

## 2020-01-29 DIAGNOSIS — E785 Hyperlipidemia, unspecified: Secondary | ICD-10-CM | POA: Diagnosis not present

## 2020-01-29 DIAGNOSIS — Z87891 Personal history of nicotine dependence: Secondary | ICD-10-CM | POA: Diagnosis not present

## 2020-01-29 DIAGNOSIS — N390 Urinary tract infection, site not specified: Secondary | ICD-10-CM | POA: Diagnosis not present

## 2020-01-29 DIAGNOSIS — I5022 Chronic systolic (congestive) heart failure: Secondary | ICD-10-CM | POA: Diagnosis not present

## 2020-01-29 DIAGNOSIS — D649 Anemia, unspecified: Secondary | ICD-10-CM | POA: Diagnosis not present

## 2020-01-29 DIAGNOSIS — Z8673 Personal history of transient ischemic attack (TIA), and cerebral infarction without residual deficits: Secondary | ICD-10-CM | POA: Diagnosis not present

## 2020-01-29 DIAGNOSIS — Z86010 Personal history of colonic polyps: Secondary | ICD-10-CM | POA: Diagnosis not present

## 2020-01-29 DIAGNOSIS — Z955 Presence of coronary angioplasty implant and graft: Secondary | ICD-10-CM | POA: Diagnosis not present

## 2020-01-29 DIAGNOSIS — Z794 Long term (current) use of insulin: Secondary | ICD-10-CM | POA: Diagnosis not present

## 2020-01-29 DIAGNOSIS — M810 Age-related osteoporosis without current pathological fracture: Secondary | ICD-10-CM | POA: Diagnosis not present

## 2020-01-30 ENCOUNTER — Other Ambulatory Visit: Payer: Self-pay | Admitting: Physician Assistant

## 2020-01-30 DIAGNOSIS — Z794 Long term (current) use of insulin: Secondary | ICD-10-CM

## 2020-01-30 DIAGNOSIS — E113299 Type 2 diabetes mellitus with mild nonproliferative diabetic retinopathy without macular edema, unspecified eye: Secondary | ICD-10-CM

## 2020-02-06 ENCOUNTER — Other Ambulatory Visit: Payer: Self-pay | Admitting: Physician Assistant

## 2020-02-06 DIAGNOSIS — Z794 Long term (current) use of insulin: Secondary | ICD-10-CM

## 2020-02-06 DIAGNOSIS — E113299 Type 2 diabetes mellitus with mild nonproliferative diabetic retinopathy without macular edema, unspecified eye: Secondary | ICD-10-CM

## 2020-02-08 DIAGNOSIS — E785 Hyperlipidemia, unspecified: Secondary | ICD-10-CM | POA: Diagnosis not present

## 2020-02-08 DIAGNOSIS — Z86010 Personal history of colonic polyps: Secondary | ICD-10-CM | POA: Diagnosis not present

## 2020-02-08 DIAGNOSIS — I25118 Atherosclerotic heart disease of native coronary artery with other forms of angina pectoris: Secondary | ICD-10-CM | POA: Diagnosis not present

## 2020-02-08 DIAGNOSIS — I5022 Chronic systolic (congestive) heart failure: Secondary | ICD-10-CM | POA: Diagnosis not present

## 2020-02-08 DIAGNOSIS — G9600 Cerebrospinal fluid leak, unspecified: Secondary | ICD-10-CM | POA: Diagnosis not present

## 2020-02-08 DIAGNOSIS — J440 Chronic obstructive pulmonary disease with acute lower respiratory infection: Secondary | ICD-10-CM | POA: Diagnosis not present

## 2020-02-08 DIAGNOSIS — Z8673 Personal history of transient ischemic attack (TIA), and cerebral infarction without residual deficits: Secondary | ICD-10-CM | POA: Diagnosis not present

## 2020-02-08 DIAGNOSIS — D649 Anemia, unspecified: Secondary | ICD-10-CM | POA: Diagnosis not present

## 2020-02-08 DIAGNOSIS — Z79899 Other long term (current) drug therapy: Secondary | ICD-10-CM | POA: Diagnosis not present

## 2020-02-08 DIAGNOSIS — I110 Hypertensive heart disease with heart failure: Secondary | ICD-10-CM | POA: Diagnosis not present

## 2020-02-08 DIAGNOSIS — Z794 Long term (current) use of insulin: Secondary | ICD-10-CM | POA: Diagnosis not present

## 2020-02-08 DIAGNOSIS — M720 Palmar fascial fibromatosis [Dupuytren]: Secondary | ICD-10-CM | POA: Diagnosis not present

## 2020-02-08 DIAGNOSIS — I252 Old myocardial infarction: Secondary | ICD-10-CM | POA: Diagnosis not present

## 2020-02-08 DIAGNOSIS — G8929 Other chronic pain: Secondary | ICD-10-CM | POA: Diagnosis not present

## 2020-02-08 DIAGNOSIS — Z955 Presence of coronary angioplasty implant and graft: Secondary | ICD-10-CM | POA: Diagnosis not present

## 2020-02-08 DIAGNOSIS — G002 Streptococcal meningitis: Secondary | ICD-10-CM | POA: Diagnosis not present

## 2020-02-08 DIAGNOSIS — I4510 Unspecified right bundle-branch block: Secondary | ICD-10-CM | POA: Diagnosis not present

## 2020-02-08 DIAGNOSIS — E119 Type 2 diabetes mellitus without complications: Secondary | ICD-10-CM | POA: Diagnosis not present

## 2020-02-08 DIAGNOSIS — Z87891 Personal history of nicotine dependence: Secondary | ICD-10-CM | POA: Diagnosis not present

## 2020-02-08 DIAGNOSIS — M810 Age-related osteoporosis without current pathological fracture: Secondary | ICD-10-CM | POA: Diagnosis not present

## 2020-02-08 DIAGNOSIS — M48061 Spinal stenosis, lumbar region without neurogenic claudication: Secondary | ICD-10-CM | POA: Diagnosis not present

## 2020-02-08 DIAGNOSIS — N390 Urinary tract infection, site not specified: Secondary | ICD-10-CM | POA: Diagnosis not present

## 2020-02-11 ENCOUNTER — Ambulatory Visit (INDEPENDENT_AMBULATORY_CARE_PROVIDER_SITE_OTHER): Payer: Medicare Other | Admitting: Physician Assistant

## 2020-02-11 ENCOUNTER — Other Ambulatory Visit: Payer: Self-pay

## 2020-02-11 ENCOUNTER — Encounter: Payer: Self-pay | Admitting: Physician Assistant

## 2020-02-11 VITALS — BP 119/51 | HR 94 | Temp 98.3°F | Resp 16 | Wt 144.5 lb

## 2020-02-11 DIAGNOSIS — I5022 Chronic systolic (congestive) heart failure: Secondary | ICD-10-CM

## 2020-02-11 DIAGNOSIS — E113293 Type 2 diabetes mellitus with mild nonproliferative diabetic retinopathy without macular edema, bilateral: Secondary | ICD-10-CM | POA: Diagnosis not present

## 2020-02-11 DIAGNOSIS — E782 Mixed hyperlipidemia: Secondary | ICD-10-CM | POA: Diagnosis not present

## 2020-02-11 DIAGNOSIS — Z794 Long term (current) use of insulin: Secondary | ICD-10-CM

## 2020-02-11 DIAGNOSIS — Z0000 Encounter for general adult medical examination without abnormal findings: Secondary | ICD-10-CM

## 2020-02-11 NOTE — Patient Instructions (Signed)
Preventive Care 74 Years and Older, Female Preventive care refers to lifestyle choices and visits with your health care provider that can promote health and wellness. This includes:  A yearly physical exam. This is also called an annual well check.  Regular dental and eye exams.  Immunizations.  Screening for certain conditions.  Healthy lifestyle choices, such as diet and exercise. What can I expect for my preventive care visit? Physical exam Your health care provider will check:  Height and weight. These may be used to calculate body mass index (BMI), which is a measurement that tells if you are at a healthy weight.  Heart rate and blood pressure.  Your skin for abnormal spots. Counseling Your health care provider may ask you questions about:  Alcohol, tobacco, and drug use.  Emotional well-being.  Home and relationship well-being.  Sexual activity.  Eating habits.  History of falls.  Memory and ability to understand (cognition).  Work and work Statistician.  Pregnancy and menstrual history. What immunizations do I need?  Influenza (flu) vaccine  This is recommended every year. Tetanus, diphtheria, and pertussis (Tdap) vaccine  You may need a Td booster every 10 years. Varicella (chickenpox) vaccine  You may need this vaccine if you have not already been vaccinated. Zoster (shingles) vaccine  You may need this after age 33. Pneumococcal conjugate (PCV13) vaccine  One dose is recommended after age 33. Pneumococcal polysaccharide (PPSV23) vaccine  One dose is recommended after age 72. Measles, mumps, and rubella (MMR) vaccine  You may need at least one dose of MMR if you were born in 1957 or later. You may also need a second dose. Meningococcal conjugate (MenACWY) vaccine  You may need this if you have certain conditions. Hepatitis A vaccine  You may need this if you have certain conditions or if you travel or work in places where you may be exposed  to hepatitis A. Hepatitis B vaccine  You may need this if you have certain conditions or if you travel or work in places where you may be exposed to hepatitis B. Haemophilus influenzae type b (Hib) vaccine  You may need this if you have certain conditions. You may receive vaccines as individual doses or as more than one vaccine together in one shot (combination vaccines). Talk with your health care provider about the risks and benefits of combination vaccines. What tests do I need? Blood tests  Lipid and cholesterol levels. These may be checked every 5 years, or more frequently depending on your overall health.  Hepatitis C test.  Hepatitis B test. Screening  Lung cancer screening. You may have this screening every year starting at age 39 if you have a 30-pack-year history of smoking and currently smoke or have quit within the past 15 years.  Colorectal cancer screening. All adults should have this screening starting at age 36 and continuing until age 15. Your health care provider may recommend screening at age 23 if you are at increased risk. You will have tests every 1-10 years, depending on your results and the type of screening test.  Diabetes screening. This is done by checking your blood sugar (glucose) after you have not eaten for a while (fasting). You may have this done every 1-3 years.  Mammogram. This may be done every 1-2 years. Talk with your health care provider about how often you should have regular mammograms.  BRCA-related cancer screening. This may be done if you have a family history of breast, ovarian, tubal, or peritoneal cancers.  Other tests  Sexually transmitted disease (STD) testing.  Bone density scan. This is done to screen for osteoporosis. You may have this done starting at age 44. Follow these instructions at home: Eating and drinking  Eat a diet that includes fresh fruits and vegetables, whole grains, lean protein, and low-fat dairy products. Limit  your intake of foods with high amounts of sugar, saturated fats, and salt.  Take vitamin and mineral supplements as recommended by your health care provider.  Do not drink alcohol if your health care provider tells you not to drink.  If you drink alcohol: ? Limit how much you have to 0-1 drink a day. ? Be aware of how much alcohol is in your drink. In the [place] one drink equals one 12 oz bottle of beer (355 mL), one 5 oz glass of wine (148 mL), or one 1 oz glass of hard liquor (44 mL). Lifestyle  Take daily care of your teeth and gums.  Stay active. Exercise for at least 30 minutes on 5 or more days each week.  Do not use any products that contain nicotine or tobacco, such as cigarettes, e-cigarettes, and chewing tobacco. If you need help quitting, ask your health care provider.  If you are sexually active, practice safe sex. Use a condom or other form of protection in order to prevent STIs (sexually transmitted infections).  Talk with your health care provider about taking a low-dose aspirin or statin. What's next?  Go to your health care provider once a year for a well check visit.  Ask your health care provider how often you should have your eyes and teeth checked.  Stay up to date on all vaccines. This information is not intended to replace advice given to you by your health care provider. Make sure you discuss any questions you have with your health care provider. Document Revised: 02/16/2018 Document Reviewed: 02/16/2018 Elsevier Patient Education  2020 [employer].

## 2020-02-11 NOTE — Progress Notes (Signed)
Annual Wellness Visit     Patient: Andrea [name], Female    DOB: 1945-03-21, 74 y.o.   MRN: [id number] Visit Date: 02/11/2020  Today's Provider: Mar Daring, PA-C   Chief Complaint  Patient presents with  . Medicare Wellness   Subjective    Andrea [name] is a 74 y.o. female who presents today for her Annual Wellness Visit. She reports consuming a general diet. The patient does not participate in regular exercise at present. She generally feels fairly well. She reports sleeping well. She does not have additional problems to discuss today.   HPI   Patient Active Problem List   Diagnosis Date Noted  . Acute respiratory failure with hypoxia ([place]) 11/23/2019  . History of meningitis 11/23/2019  . 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 PAD (peripheral artery disease) ([place]) 12/28/2018  . Moderate episode of recurrent major depressive disorder ([place]) 12/19/2017  . Leg length discrepancy 09/21/2017  . Coronary artery disease of native artery of native heart with stable angina pectoris ([place]) 01/24/2017  . Drug-induced constipation 01/06/2017  . Spinal stenosis of lumbar region 01/06/2017  . Spondylolisthesis of lumbar region 12/30/2016  . Obese 04/16/2015  . History of colon polyps 11/23/2012  . Bundle branch block, 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  . 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 Encounter for long-term (current) use of aspirin 11/23/2012  . Status post percutaneous transluminal coronary angioplasty 11/23/2012  . Transient ischemic attack (TIA), and cerebral infarction without residual deficits(V12.54) 11/23/2012  . Absolute anemia 08/17/2012  . Chronic obstructive pulmonary disease ([place]) 08/17/2012  . HLD (hyperlipidemia) 08/17/2012  . OP (osteoporosis) 08/17/2012  . 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 edema, with long-term current  use of insulin ([place]) 08/17/2012  . Chronic systolic heart failure ([place]) 07/30/2012   Past Medical History:  Diagnosis Date  . Bronchitis   . Chickenpox   . Coronary artery disease   . Depression   . Diabetes mellitus type 2, uncomplicated ([place])   . Diabetes mellitus without complication ([place])   . Diabetic retinopathy ([place])   . Dupuytren contracture   . Frequent urinary tract infections   . GERD (gastroesophageal reflux disease)   . History of hand surgery 2011   left hand  . Hyperlipidemia, unspecified   . Irritable bowel syndrome   . MI (myocardial infarction) ([place]) 1994  . Neuromuscular disorder ([place])    nerve pain in back and legs  . Osteoporosis   . Pneumonia 2014  . Stroke ([place])   . TIA (transient ischemic attack)   . Vitamin D deficiency, unspecified   . Wheezing        Medications: Outpatient Medications Prior to Visit  Medication Sig  . Alcohol Swabs (ALCOHOL PREP) PADS Use twice daily prior to SQ injection of insulin to clean skin  . aspirin EC 81 MG tablet Take 81 mg daily by mouth.  [name] Kitchen atorvastatin (LIPITOR) 80 MG tablet TAKE 1 TABLET BY MOUTH EVERY DAY  . Calcium Carb-Cholecalciferol ([facility] CALCIUM 600+D) 600-800 MG-UNIT TABS Take 1 tablet by mouth in the morning and at bedtime.   . Cyanocobalamin (B-12) 1000 MCG CAPS Take 1,000 mcg by mouth daily.   [name] Kitchen HYDROcodone-acetaminophen (NORCO) 10-325 MG tablet Take 1 tablet by mouth every 8 (eight) hours as needed.  . insulin glargine (LANTUS) 100 UNIT/ML injection INJECT 65 UNITS INTO THE SKIN TWICE DAILY WITH A  MEAL (Patient taking differently: Inject 65 Units into the skin 2 (two) times daily. )  . INSULIN SYRINGE 1CC/29G 29G X 1/2" 1 ML MISC For lantus injections twice daily  . losartan (COZAAR) 25 MG tablet TAKE 1 TABLET BY MOUTH EVERY DAY  . metoprolol succinate (TOPROL-XL) 25 MG 24 hr tablet Take 1 tablet (25 mg total) by mouth daily. (Patient taking differently: Take 12.5 mg by mouth every morning. )  . nystatin  (MYCOSTATIN/NYSTOP) powder Apply 1 application topically 3 (three) times daily.  [name] Kitchen omeprazole (PRILOSEC) 40 MG capsule TAKE 1 CAPSULE BY MOUTH EVERY DAY  . ondansetron (ZOFRAN) 4 MG tablet Take 1 tablet (4 mg total) by mouth every 8 (eight) hours as needed.  Glory Rosebush Delica Lancets 45W MISC To check blood sugar daily  DX: E11.9  . ONETOUCH VERIO test strip TO CHECK BLOOD SUGAR ONCE DAILY.  [name] Kitchen oxybutynin (DITROPAN-XL) 10 MG 24 hr tablet TAKE 1 TABLET BY MOUTH EVERYDAY AT BEDTIME  . sodium chloride (OCEAN) 0.65 % nasal spray Use 2 sprays into each nostril five (5) times a day.  . TRULICITY 1.5 UJ/8.1XB SOPN INJECT 1.5MG  INTO THE SKIN ONCE A WEEK AS DIRECTED  . Vitamin D, Ergocalciferol, (DRISDOL) 1.25 MG (50000 UNIT) CAPS capsule TAKE ONE CAPSULE BY MOUTH ONCE WEEKLY ON THE SAME DAY EACH WEEK (ON FRIDAYS)   No facility-administered medications prior to visit.    Allergies  Allergen Reactions  . Metformin And Related Diarrhea  . Sulfa Antibiotics Rash    Patient Care Team: Mar Daring, PA-C as PCP - General (Family Medicine) Minna Merritts, MD as PCP - Cardiology (Cardiology) Estill Cotta, MD as Consulting Physician (Ophthalmology) Newman Pies, MD as Consulting Physician (Neurosurgery) Lucky Cowboy Erskine Squibb, MD as Referring Physician (Vascular Surgery) [name], MD as Consulting Physician (Gastroenterology)  Review of Systems  Constitutional: Positive for appetite change.       Crying, Irritability   HENT: Negative.   Eyes: Negative.   Respiratory: Positive for shortness of breath.   Cardiovascular: Negative.   Gastrointestinal: Negative.  Negative for constipation.  Endocrine: Negative.   Genitourinary: Negative.   Musculoskeletal: Negative.   Skin: Negative.   Allergic/Immunologic: Negative.   Neurological: Negative.   Hematological: Negative.   Psychiatric/Behavioral: Negative.     Last CBC Lab Results  Component Value Date   WBC 11.7 (H) 12/26/2019     HGB 10.6 (L) 12/26/2019   HCT 32.5 (L) 12/26/2019   MCV 91.3 12/26/2019   MCH 29.8 12/26/2019   RDW 14.6 12/26/2019   PLT 233 14/78/2956   Last metabolic panel Lab Results  Component Value Date   GLUCOSE 496 (H) 12/26/2019   NA 125 (L) 12/26/2019   K 4.7 12/26/2019   CL 100 12/26/2019   CO2 13 (L) 12/26/2019   BUN 69 (H) 12/26/2019   CREATININE 2.37 (H) 12/26/2019   GFRNONAA 20 (L) 12/26/2019   GFRAA >60 11/24/2019   CALCIUM 7.9 (L) 12/26/2019   PROT 6.1 (L) 12/26/2019   ALBUMIN 2.7 (L) 12/26/2019   LABGLOB 2.6 12/28/2018   AGRATIO 1.7 12/28/2018   BILITOT 0.8 12/26/2019   ALKPHOS 93 12/26/2019   AST 9 (L) 12/26/2019   ALT 11 12/26/2019   ANIONGAP 12 12/26/2019      Objective    Vitals: BP (!) 119/51 (BP Location: Left Arm, Patient Position: Sitting, Cuff Size: Large)   Pulse 94   Temp 98.3 F (36.8 C) (Oral)   Resp 16   Wt 144  lb 8 oz (65.5 kg)   BMI 28.22 kg/m  BP Readings from Last 3 Encounters:  02/11/20 (!) 119/51  12/26/19 (!) 105/50  11/24/19 (!) 154/63   Wt Readings from Last 3 Encounters:  02/11/20 144 lb 8 oz (65.5 kg)  12/26/19 150 lb 3.2 oz (68.1 kg)  11/15/19 160 lb 8 oz (72.8 kg)      Physical Exam Vitals reviewed.  Constitutional:      General: She is not in acute distress.    Appearance: Normal appearance. She is well-developed, well-groomed and overweight. She is not diaphoretic.  HENT:     Head: Normocephalic and atraumatic.     Right Ear: Tympanic membrane, ear canal and external ear normal.     Left Ear: Tympanic membrane, ear canal and external ear normal.  Eyes:     General: No scleral icterus.       Right eye: No discharge.        Left eye: No discharge.     Extraocular Movements: Extraocular movements intact.     Conjunctiva/sclera: Conjunctivae normal.     Pupils: Pupils are equal, round, and reactive to light.  Neck:     Thyroid: No thyromegaly.     Vascular: No carotid bruit or JVD.     Trachea: No tracheal  deviation.  Cardiovascular:     Rate and Rhythm: Normal rate and regular rhythm.     Pulses: Normal pulses.     Heart sounds: Normal heart sounds. No murmur heard.  No friction rub. No gallop.   Pulmonary:     Effort: Pulmonary effort is normal. No respiratory distress.     Breath sounds: Normal breath sounds. No wheezing or rales.  Chest:     Chest wall: No tenderness.  Abdominal:     General: Abdomen is protuberant. Bowel sounds are normal. There is no distension.     Palpations: Abdomen is soft. There is no mass.     Tenderness: There is no abdominal tenderness. There is no guarding or rebound.  Musculoskeletal:        General: No tenderness. Normal range of motion.     Cervical back: Normal range of motion and neck supple. No tenderness.     Right lower leg: No edema.     Left lower leg: No edema.  Lymphadenopathy:     Cervical: No cervical adenopathy.  Skin:    General: Skin is warm and dry.     Capillary Refill: Capillary refill takes less than 2 seconds.     Findings: No rash.  Neurological:     General: No focal deficit present.     Mental Status: She is alert and oriented to person, place, and time. Mental status is at baseline.  Psychiatric:        Mood and Affect: Mood normal.        Behavior: Behavior normal. Behavior is cooperative.        Thought Content: Thought content normal.        Judgment: Judgment normal.     Most recent functional status assessment: In your present state of health, do you have any difficulty performing the following activities: 02/11/2020  Hearing? N  Vision? N  Difficulty concentrating or making decisions? Y  Comment "sometimes"  Walking or climbing stairs? Y  Comment -  Dressing or bathing? N  Comment -  Doing errands, shopping? Y  Preparing Food and eating ? -  Using the Toilet? -  In the past six  months, have you accidently leaked urine? -  Do you have problems with loss of bowel control? -  Managing your Medications? -    Managing your Finances? -  Housekeeping or managing your Housekeeping? -  Some recent data might be hidden   Most recent fall risk assessment: Fall Risk  07/09/2019  Falls in the past year? 0  Number falls in past yr: 0  Injury with Fall? 0  Risk for fall due to : -  Follow up -    Most recent depression screenings: PHQ 2/9 Scores 02/11/2020 12/26/2019  PHQ - 2 Score 3 6  PHQ- 9 Score 6 17   Most recent cognitive screening: 6CIT Screen 02/11/2020  What Year? 0 points  What month? 0 points  What time? 0 points  Count back from 20 0 points  Months in reverse 0 points  Repeat phrase 2 points  Total Score 2   Most recent Audit-C alcohol use screening Alcohol Use Disorder Test (AUDIT) 02/11/2020  1. How often do you have a drink containing alcohol? 0  2. How many drinks containing alcohol do you have on a typical day when you are drinking? 0  3. How often do you have six or more drinks on one occasion? 0  AUDIT-C Score 0  Alcohol Brief Interventions/Follow-up AUDIT Score <7 follow-up not indicated   A score of 3 or more in women, and 4 or more in men indicates increased risk for alcohol abuse, EXCEPT if all of the points are from question 1   No results found for any visits on 02/11/20.  Assessment & Plan     Annual wellness visit done today including the all of the following: Reviewed patient's Family Medical History Reviewed and updated list of patient's medical providers Assessment of cognitive impairment was done Assessed patient's functional ability Established a written schedule for health screening [place] Completed and Reviewed  Exercise Activities and Dietary recommendations Goals    . Decrease soda or juice intake     Recommend decreasing caffeine by 2 glasses a dayand drinking 3 glasses of water a day as a subsitute.    . Have 3 meals a day     Recommend to decrease portion sizes by eating 3 small healthy meals and at least 2 healthy  snacks per day.         Immunization History  Administered Date(s) Administered  . Influenza, High Dose Seasonal PF 02/12/2016, 12/16/2016, 12/19/2017  . PFIZER SARS-COV-2 Vaccination 07/14/2019, 08/14/2019  . Zoster Recombinat (Shingrix) 05/07/2018, 11/21/2018    Health Maintenance  Topic Date Due  . TETANUS/TDAP  Never done  . FOOT EXAM  12/28/2019  . HEMOGLOBIN A1C  01/11/2020  . DEXA SCAN  03/07/2020  . OPHTHALMOLOGY EXAM  09/11/2020  . MAMMOGRAM  01/22/2022  . COLONOSCOPY  02/07/2022  . INFLUENZA VACCINE  Completed  . COVID-19 Vaccine  Completed  . Hepatitis C Screening  Completed  . PNA vac Low Risk Adult  Completed     Discussed health benefits of physical activity, and encouraged her to engage in regular exercise appropriate for her age and condition.    1. Medicare annual wellness visit, subsequent Normal physical exam today. Will check labs as below and f/u pending lab results. If labs are stable and WNL she will not need to have these rechecked for one year at her next annual physical exam. She is to call the office in the meantime if she has any acute issue,  questions or concerns.  2. Chronic systolic heart failure (HCC) Stable. Followed by Cardiology. Will check labs as below and f/u pending results. - CBC with Differential/Platelet - Comprehensive metabolic panel - Lipid panel - Hemoglobin A1c  3. 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 edema, with long-term current use of insulin (HCC) Stable. Continue Lantus 65 units BID, Trulicity 1.5mg  SQ weekly. Will check labs as below and f/u pending results. F/U in 3 months. Referral for diabetic nail care. - CBC with Differential/Platelet - Comprehensive metabolic panel - Lipid panel - Hemoglobin A1c - Ambulatory referral to Podiatry  4. Mixed hyperlipidemia Stable. Continue Atorvastatin 80mg . Will check labs as below and f/u pending results. - CBC  with Differential/Platelet - Comprehensive metabolic panel - Lipid panel - Hemoglobin A1c   No follow-ups on file.     Reynolds Bowl, PA-C, have reviewed all documentation for this visit. The documentation on 02/11/20 for the exam, diagnosis, procedures, and orders are all accurate and complete.   Rubye Beach  [facility] [phone] (phone) [phone] (fax)  [place]

## 2020-02-12 LAB — CBC WITH DIFFERENTIAL/PLATELET
Basophils Absolute: 0 10*3/uL (ref 0.0–0.2)
Basos: 0 %
EOS (ABSOLUTE): 0.2 10*3/uL (ref 0.0–0.4)
Eos: 2 %
Hematocrit: 37 % (ref 34.0–46.6)
Hemoglobin: 11.9 g/dL (ref 11.1–15.9)
Immature Grans (Abs): 0 10*3/uL (ref 0.0–0.1)
Immature Granulocytes: 0 %
Lymphocytes Absolute: 2.9 10*3/uL (ref 0.7–3.1)
Lymphs: 31 %
MCH: 28.9 pg (ref 26.6–33.0)
MCHC: 32.2 g/dL (ref 31.5–35.7)
MCV: 90 fL (ref 79–97)
Monocytes Absolute: 0.6 10*3/uL (ref 0.1–0.9)
Monocytes: 6 %
Neutrophils Absolute: 5.7 10*3/uL (ref 1.4–7.0)
Neutrophils: 61 %
Platelets: 226 10*3/uL (ref 150–450)
RBC: 4.12 x10E6/uL (ref 3.77–5.28)
RDW: 13.8 % (ref 11.7–15.4)
WBC: 9.3 10*3/uL (ref 3.4–10.8)

## 2020-02-12 LAB — LIPID PANEL
Chol/HDL Ratio: 1.7 ratio (ref 0.0–4.4)
Cholesterol, Total: 115 mg/dL (ref 100–199)
HDL: 67 mg/dL (ref >39)
LDL Chol Calc (NIH): 29 mg/dL (ref 0–99)
Triglycerides: 105 mg/dL (ref 0–149)
VLDL Cholesterol Cal: 19 mg/dL (ref 5–40)

## 2020-02-12 LAB — HEMOGLOBIN A1C
Est. average glucose Bld gHb Est-mCnc: 169 mg/dL
Hgb A1c MFr Bld: 7.5 % — ABNORMAL HIGH (ref 4.8–5.6)

## 2020-02-12 LAB — COMPREHENSIVE METABOLIC PANEL
ALT: 8 IU/L (ref 0–32)
AST: 15 IU/L (ref 0–40)
Albumin/Globulin Ratio: 1.6 (ref 1.2–2.2)
Albumin: 4.1 g/dL (ref 3.7–4.7)
Alkaline Phosphatase: 124 IU/L — ABNORMAL HIGH (ref 44–121)
BUN/Creatinine Ratio: 17 (ref 12–28)
BUN: 16 mg/dL (ref 8–27)
Bilirubin Total: 0.4 mg/dL (ref 0.0–1.2)
CO2: 23 mmol/L (ref 20–29)
Calcium: 9.4 mg/dL (ref 8.7–10.3)
Chloride: 105 mmol/L (ref 96–106)
Creatinine, Ser: 0.93 mg/dL (ref 0.57–1.00)
GFR calc Af Amer: 70 mL/min/{1.73_m2} (ref >59)
GFR calc non Af Amer: 61 mL/min/{1.73_m2} (ref >59)
Globulin, Total: 2.6 g/dL (ref 1.5–4.5)
Glucose: 69 mg/dL (ref 65–99)
Potassium: 4.6 mmol/L (ref 3.5–5.2)
Sodium: 142 mmol/L (ref 134–144)
Total Protein: 6.7 g/dL (ref 6.0–8.5)

## 2020-02-14 ENCOUNTER — Other Ambulatory Visit: Payer: Self-pay | Admitting: Physician Assistant

## 2020-02-14 DIAGNOSIS — M4316 Spondylolisthesis, lumbar region: Secondary | ICD-10-CM

## 2020-02-14 MED ORDER — HYDROCODONE-ACETAMINOPHEN 10-325 MG PO TABS: 1.0000 | ORAL_TABLET | Freq: Three times a day (TID) | ORAL | 0 refills | Status: DC | PRN

## 2020-02-14 NOTE — Telephone Encounter (Signed)
Medication Refill - Medication: Hydrocodone   Has the patient contacted their pharmacy? Yes.   (Agent: If no, request that the patient contact the pharmacy for the refill.) (Agent: If yes, when and what did the pharmacy advise?)  Preferred Pharmacy (with phone number or street name):  [id number] [place] [place]  [address] [place] [address]  Phone: [phone] Fax: [phone]  Hours: Not open 24 hours     Agent: Please be advised that RX refills may take up to 3 business days. We ask that you follow-up with your pharmacy.

## 2020-02-14 NOTE — Telephone Encounter (Signed)
Requested medication (s) are due for refill today:yes   Requested medication (s) are on the active medication list: yes   Last refill:  01/14/2020  Future visit scheduled: yes   Notes to clinic:  this refill cannot be delegated    Requested Prescriptions  Pending Prescriptions Disp Refills   HYDROcodone-acetaminophen (NORCO) 10-325 MG tablet 90 tablet 0    Sig: Take 1 tablet by mouth every 8 (eight) hours as needed.      There is no refill protocol information for this order

## 2020-02-21 DIAGNOSIS — J310 Chronic rhinitis: Secondary | ICD-10-CM | POA: Diagnosis not present

## 2020-03-05 ENCOUNTER — Ambulatory Visit: Payer: Medicare Other | Admitting: Podiatry

## 2020-03-06 ENCOUNTER — Other Ambulatory Visit: Payer: Self-pay

## 2020-03-06 ENCOUNTER — Encounter: Payer: Self-pay | Admitting: Podiatry

## 2020-03-06 ENCOUNTER — Ambulatory Visit: Payer: Medicare Other | Admitting: Podiatry

## 2020-03-06 DIAGNOSIS — Z794 Long term (current) use of insulin: Secondary | ICD-10-CM | POA: Diagnosis not present

## 2020-03-06 DIAGNOSIS — I739 Peripheral vascular disease, unspecified: Secondary | ICD-10-CM | POA: Diagnosis not present

## 2020-03-06 DIAGNOSIS — B351 Tinea unguium: Secondary | ICD-10-CM | POA: Diagnosis not present

## 2020-03-06 DIAGNOSIS — E113293 Type 2 diabetes mellitus with mild nonproliferative diabetic retinopathy without macular edema, bilateral: Secondary | ICD-10-CM

## 2020-03-06 DIAGNOSIS — M79675 Pain in left toe(s): Secondary | ICD-10-CM | POA: Diagnosis not present

## 2020-03-06 NOTE — Progress Notes (Signed)
This patient returns to my office for at risk foot care.  This patient requires this care by a professional since this patient will be at risk due to having PAD and diabetes.  She presents to the office with her daughter.  She says multiple nails on her left foot have grown into the skin and are causing pain.  This patient is unable to cut nails herself since the patient cannot reach her nails.These nails are painful walking and wearing shoes.  This patient presents for at risk foot care today.  General Appearance  Alert, conversant and in no acute stress.  Vascular  Dorsalis pedis  Are weakly  palpable  bilaterally. Posterior tibial pulses are absent   B/L.   Capillary return is within normal limits  bilaterally. Temperature is within normal limits  bilaterally.  Neurologic  Senn-Weinstein monofilament wire test within normal limits  bilaterally. Muscle power within normal limits bilaterally.  Nails Thick disfigured discolored nails with nails growing into the distal aspect of 1,2 and 4 left foot.   No evidence of bacterial infection or drainage bilaterally.  Orthopedic  No limitations of motion  feet .  No crepitus or effusions noted.  No bony pathology or digital deformities noted.  Midfoot  DJD  B/L.  Skin  normotropic skin with no porokeratosis noted bilaterally.  No signs of infections or ulcers noted.     Onychomycosis    Pain in left toes  Consent was obtained for treatment procedures.   Mechanical debridement of nails 1-5  Left foot. performed with a nail nipper.  Filed with dremel without incident.    Return office visit    prn                 Told patient to return for periodic foot care and evaluation due to potential at risk complications.   Helane Gunther DPM

## 2020-03-17 ENCOUNTER — Other Ambulatory Visit: Payer: Self-pay | Admitting: Physician Assistant

## 2020-03-17 DIAGNOSIS — M4316 Spondylolisthesis, lumbar region: Secondary | ICD-10-CM

## 2020-03-17 NOTE — Telephone Encounter (Signed)
Requested medication (s) are due for refill today: yes  Requested medication (s) are on the active medication list: yes  Last refill:  02/14/2020  Future visit scheduled: yes  Notes to clinic:  this refill cannot be delegated    Requested Prescriptions  Pending Prescriptions Disp Refills   HYDROcodone-acetaminophen (NORCO) 10-325 MG tablet 90 tablet 0    Sig: Take 1 tablet by mouth every 8 (eight) hours as needed.      There is no refill protocol information for this order

## 2020-03-17 NOTE — Telephone Encounter (Signed)
Medication Refill - Medication: HYDROcodone-acetaminophen (NORCO) 10-325 MG tablet     Preferred Pharmacy (with phone number or street name):  [id number] [place] [place] - [address] [place] [address] [facility] Phone:  [phone]  Fax:  [phone]       Agent: Please be advised that RX refills may take up to 3 business days. We ask that you follow-up with your pharmacy.  

## 2020-03-18 MED ORDER — HYDROCODONE-ACETAMINOPHEN 10-325 MG PO TABS: 1.0000 | ORAL_TABLET | Freq: Three times a day (TID) | ORAL | 0 refills | Status: DC | PRN

## 2020-03-25 ENCOUNTER — Encounter (INDEPENDENT_AMBULATORY_CARE_PROVIDER_SITE_OTHER): Payer: Self-pay

## 2020-03-25 ENCOUNTER — Encounter (INDEPENDENT_AMBULATORY_CARE_PROVIDER_SITE_OTHER): Payer: Medicare Other

## 2020-03-25 ENCOUNTER — Ambulatory Visit (INDEPENDENT_AMBULATORY_CARE_PROVIDER_SITE_OTHER): Payer: Medicare Other | Admitting: Vascular Surgery

## 2020-04-08 ENCOUNTER — Other Ambulatory Visit (INDEPENDENT_AMBULATORY_CARE_PROVIDER_SITE_OTHER): Payer: Self-pay | Admitting: Nurse Practitioner

## 2020-04-08 DIAGNOSIS — I739 Peripheral vascular disease, unspecified: Secondary | ICD-10-CM

## 2020-04-09 ENCOUNTER — Ambulatory Visit (INDEPENDENT_AMBULATORY_CARE_PROVIDER_SITE_OTHER): Payer: Medicare Other

## 2020-04-09 ENCOUNTER — Other Ambulatory Visit: Payer: Self-pay

## 2020-04-09 ENCOUNTER — Encounter (INDEPENDENT_AMBULATORY_CARE_PROVIDER_SITE_OTHER): Payer: Self-pay | Admitting: Nurse Practitioner

## 2020-04-09 ENCOUNTER — Telehealth (INDEPENDENT_AMBULATORY_CARE_PROVIDER_SITE_OTHER): Payer: Self-pay | Admitting: Nurse Practitioner

## 2020-04-09 ENCOUNTER — Ambulatory Visit (INDEPENDENT_AMBULATORY_CARE_PROVIDER_SITE_OTHER): Payer: Medicare Other | Admitting: Nurse Practitioner

## 2020-04-09 VITALS — BP 144/82 | HR 99 | Resp 16 | Wt 153.0 lb

## 2020-04-09 DIAGNOSIS — Z794 Long term (current) use of insulin: Secondary | ICD-10-CM

## 2020-04-09 DIAGNOSIS — I739 Peripheral vascular disease, unspecified: Secondary | ICD-10-CM

## 2020-04-09 DIAGNOSIS — E782 Mixed hyperlipidemia: Secondary | ICD-10-CM

## 2020-04-09 DIAGNOSIS — M7989 Other specified soft tissue disorders: Secondary | ICD-10-CM | POA: Diagnosis not present

## 2020-04-09 DIAGNOSIS — E113293 Type 2 diabetes mellitus with mild nonproliferative diabetic retinopathy without macular edema, bilateral: Secondary | ICD-10-CM

## 2020-04-09 MED ORDER — CEPHALEXIN 500 MG PO CAPS: 500.0000 mg | ORAL_CAPSULE | Freq: Three times a day (TID) | ORAL | 0 refills | Status: DC

## 2020-04-15 DIAGNOSIS — H538 Other visual disturbances: Secondary | ICD-10-CM | POA: Diagnosis not present

## 2020-04-16 ENCOUNTER — Other Ambulatory Visit: Payer: Self-pay | Admitting: Physician Assistant

## 2020-04-16 DIAGNOSIS — M4316 Spondylolisthesis, lumbar region: Secondary | ICD-10-CM

## 2020-04-16 DIAGNOSIS — N3281 Overactive bladder: Secondary | ICD-10-CM

## 2020-04-16 MED ORDER — HYDROCODONE-ACETAMINOPHEN 10-325 MG PO TABS
1.0000 | ORAL_TABLET | Freq: Three times a day (TID) | ORAL | 0 refills | Status: DC | PRN
Start: 2020-04-16 — End: 2020-05-15

## 2020-04-16 NOTE — Telephone Encounter (Signed)
Requested medication (s) are due for refill today: yes  Requested medication (s) are on the active medication list: yes  Last refill:  03/17/2020  Future visit scheduled:yes  Notes to clinic: this refill cannot be delegated    Requested Prescriptions  Pending Prescriptions Disp Refills   HYDROcodone-acetaminophen (NORCO) 10-325 MG tablet 90 tablet 0    Sig: Take 1 tablet by mouth every 8 (eight) hours as needed.      Not Delegated - Analgesics:  Opioid Agonist Combinations Failed - 04/16/2020 11:23 AM      Failed - This refill cannot be delegated      Failed - Urine Drug Screen completed in last 360 days      Passed - Valid encounter within last 6 months    Recent Outpatient Visits           3 months ago SOB (shortness of breath)   [facility], [place], PA-C   6 months ago 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 edema, with long-term current use of insulin [facility])   [facility], Anderson Malta M, [place]   9 months ago Type 2 diabetes mellitus with diabetic peripheral angiopathy without gangrene, with long-term current use of insulin [facility])   [place], [place], [place]   1 year ago Type 2 diabetes mellitus with diabetic peripheral angiopathy without gangrene, with long-term current use of insulin [facility])   [facility] [place], Clearnce Sorrel, [place]   1 year ago Annual physical exam   [facility], Clearnce Sorrel, [place]       Future Appointments             In 4 weeks Marlyn Corporal, Clearnce Sorrel, PA-C [employer], [facility]

## 2020-04-16 NOTE — Telephone Encounter (Signed)
Medication Refill - Medication: HYDROcodone-acetaminophen ([place]) 10-325 MG tablet     Preferred Pharmacy (with phone number or street name):  [id number] [place], [place] Phone:  [phone]  Fax:  [phone]       Agent: Please be advised that RX refills may take up to 3 business days. We ask that you follow-up with your pharmacy.

## 2020-04-18 ENCOUNTER — Encounter (INDEPENDENT_AMBULATORY_CARE_PROVIDER_SITE_OTHER): Payer: Self-pay | Admitting: Nurse Practitioner

## 2020-04-18 ENCOUNTER — Other Ambulatory Visit (INDEPENDENT_AMBULATORY_CARE_PROVIDER_SITE_OTHER): Payer: Self-pay | Admitting: Nurse Practitioner

## 2020-04-18 DIAGNOSIS — R609 Edema, unspecified: Secondary | ICD-10-CM

## 2020-04-18 DIAGNOSIS — I739 Peripheral vascular disease, unspecified: Secondary | ICD-10-CM

## 2020-04-18 NOTE — Progress Notes (Signed)
Subjective:    Patient ID: Andrea [name], female    DOB: 17-Oct-1945, 75 y.o.   MRN: [id number] Chief Complaint  Patient presents with  . Follow-up    1 yr ABI    The patient returns to the office for followup and review of the noninvasive studies. There have been no interval changes in lower extremity symptoms. No interval shortening of the patient's claudication distance or development of rest pain symptoms. No new ulcers or wounds have occurred since the last visit.  There have been no significant changes to the patient's overall health care.  The patient denies amaurosis fugax or recent TIA symptoms. There are no recent neurological changes noted. The patient denies history of DVT, PE or superficial thrombophlebitis. The patient denies recent episodes of angina or shortness of breath.   ABI Rt=1.07 and Lt=1.07  (previous ABI's Rt=1.25 and Lt=1.04) Duplex ultrasound of the reveals triphasic tibial artery waveforms with good toe waveforms bilaterally.  The patient's pain is complains of swelling in her left lower extremity.  Patient notes that she had a fall and subsequently had some swelling afterwards.  This is been ongoing for several weeks. The swelling is painful and is tender.   Review of Systems  Cardiovascular: Positive for leg swelling.  Musculoskeletal: Positive for gait problem.  Hematological: Bruises/bleeds easily.  All other systems reviewed and are negative.      Objective:   Physical Exam Vitals reviewed.  HENT:     Head: Normocephalic.  Cardiovascular:     Rate and Rhythm: Normal rate.     Pulses: Normal pulses.  Pulmonary:     Effort: Pulmonary effort is normal.  Musculoskeletal:     Right lower leg: Edema present.     Left lower leg: Edema present.  Neurological:     Mental Status: She is alert and oriented to person, place, and time.  Psychiatric:        Mood and Affect: Mood normal.        Behavior: Behavior normal.        Thought Content:  Thought content normal.        Judgment: Judgment normal.     BP (!) 144/82 (BP Location: Left Arm)   Pulse 99   Resp 16   Wt 153 lb (69.4 kg)   BMI 29.88 kg/m   Past Medical History:  Diagnosis Date  . Bronchitis   . Chickenpox   . Coronary artery disease   . Depression   . Diabetes mellitus type 2, uncomplicated ([place])   . Diabetes mellitus without complication ([place])   . Diabetic retinopathy ([place])   . Dupuytren contracture   . Frequent urinary tract infections   . GERD (gastroesophageal reflux disease)   . History of hand surgery 2011   left hand  . Hyperlipidemia, unspecified   . Irritable bowel syndrome   . MI (myocardial infarction) ([place]) 1994  . Neuromuscular disorder ([place])    nerve pain in back and legs  . Osteoporosis   . Pneumonia 2014  . Stroke ([place])   . TIA (transient ischemic attack)   . Vitamin D deficiency, unspecified   . Wheezing     Social History   Socioeconomic History  . Marital status: Married    Spouse name: Roselie Awkward  . Number of children: 3  . Years of education: 8  . Highest education level: 8th grade  Occupational History  . Occupation: retired  Tobacco Use  . Smoking status: Former Smoker  Packs/day: 1.00    Years: 30.00    Pack years: 30.00    Types: Cigarettes    Quit date: 07/17/2012    Years since quitting: 7.7  . Smokeless tobacco: Never Used  . Tobacco comment:    Vaping Use  . Vaping Use: Never used  Substance and Sexual Activity  . Alcohol use: Not Currently    Alcohol/week: 0.0 standard drinks  . Drug use: No  . Sexual activity: Not on file  Other Topics Concern  . Not on file  Social History Narrative  . Not on file   Social Determinants of Health   Financial Resource Strain: Low Risk   . Difficulty of Paying Living Expenses: Not hard at all  Food Insecurity: No Food Insecurity  . Worried About Charity fundraiser in the Last Year: Never true  . Ran Out of Food in the Last Year: Never true  Transportation  Needs: No Transportation Needs  . Lack of Transportation (Medical): No  . Lack of Transportation (Non-Medical): No  Physical Activity: Inactive  . Days of Exercise per Week: 0 days  . Minutes of Exercise per Session: 0 min  Stress: No Stress Concern Present  . Feeling of Stress : Not at all  Social Connections: Moderately Isolated  . Frequency of Communication with Friends and Family: More than three times a week  . Frequency of Social Gatherings with Friends and Family: Once a week  . Attends Religious Services: Never  . Active Member of Clubs or Organizations: No  . Attends Archivist Meetings: Never  . Marital Status: Married  Human resources officer Violence: Not At Risk  . Fear of Current or Ex-Partner: No  . Emotionally Abused: No  . Physically Abused: No  . Sexually Abused: No    Past Surgical History:  Procedure Laterality Date  . CARDIAC CATHETERIZATION  1994  . COLONOSCOPY     x3  . COLONOSCOPY WITH PROPOFOL N/A 02/08/2019   Procedure: COLONOSCOPY WITH PROPOFOL;  Surgeon: [name], MD;  Location: [facility] ENDOSCOPY;  Service: Gastroenterology;  Laterality: N/A;  . CORONARY ANGIOPLASTY  1994  . EYE SURGERY Bilateral    cataracts  . HAND SURGERY Left 2011   left hand release of contractures  . HIP FRACTURE SURGERY  11/2013  . PR COLSC FLX W/REMOVAL LESION BY HOT BX FORCEPS   08/21/2015   Procedure: COLONOSCOPY, FLEXIBLE, PROXIMAL TO SPLENIC FLEXURE; W/REMOVAL TUMOR/POLYP/OTHER LESION, HOT BX FORCEP/CAUTE; Surgeon: Carlena Hurl, MD; Location: OR [facility]; Service: General Surgery  . TEE WITHOUT CARDIOVERSION N/A 11/20/2019   Procedure: TRANSESOPHAGEAL ECHOCARDIOGRAM (TEE);  Surgeon: Nelva Bush, MD;  Location: [facility] ORS;  Service: Cardiovascular;  Laterality: N/A;    Family History  Problem Relation Age of Onset  . Depression Daughter   . Arthritis Daughter        spine  . Plantar fasciitis Daughter   . Anxiety disorder Daughter   .  Hyperlipidemia Daughter   . AAA (abdominal aortic aneurysm) Daughter   . Hypertension Mother   . Stroke Mother   . Cataracts Mother   . Depression Mother   . Heart disease Mother   . Asthma Brother   . Diabetes Daughter   . Hyperlipidemia Daughter   . Hypertension Daughter   . Breast cancer Cousin        maternal    Allergies  Allergen Reactions  . Metformin And Related Diarrhea  . Sulfa Antibiotics Rash    CBC Latest Ref Rng &  Units 02/11/2020 12/26/2019 11/24/2019  WBC 3.4 - 10.8 x10E3/uL 9.3 11.7(H) 10.1  Hemoglobin 11.1 - 15.9 g/dL 11.9 10.6(L) 9.8(L)  Hematocrit 34.0 - 46.6 % 37.0 32.5(L) 29.1(L)  Platelets 150 - 450 x10E3/uL 226 233 289      CMP     Component Value Date/Time   NA 142 02/11/2020 1441   NA 143 01/01/2014 0643   K 4.6 02/11/2020 1441   K 4.2 01/01/2014 0643   CL 105 02/11/2020 1441   CL 105 01/01/2014 0643   CO2 23 02/11/2020 1441   CO2 30 01/01/2014 0643   GLUCOSE 69 02/11/2020 1441   GLUCOSE 496 (H) 12/26/2019 1511   GLUCOSE 136 (H) 01/01/2014 0643   BUN 16 02/11/2020 1441   BUN 17 01/01/2014 0643   CREATININE 0.93 02/11/2020 1441   CREATININE 1.08 (H) 12/16/2016 1539   CALCIUM 9.4 02/11/2020 1441   CALCIUM 8.3 (L) 01/01/2014 0643   PROT 6.7 02/11/2020 1441   ALBUMIN 4.1 02/11/2020 1441   AST 15 02/11/2020 1441   ALT 8 02/11/2020 1441   ALKPHOS 124 (H) 02/11/2020 1441   BILITOT 0.4 02/11/2020 1441   GFRNONAA 61 02/11/2020 1441   GFRNONAA 20 (L) 12/26/2019 1511   GFRNONAA 52 (L) 12/16/2016 1539   GFRAA 70 02/11/2020 1441   GFRAA 60 12/16/2016 1539     VAS [place] ABI WITH/WO TBI  Result Date: 04/11/2020 LOWER EXTREMITY DOPPLER STUDY Indications: Peripheral artery disease.  Comparison Study: 03/22/2019 Performing Technologist: Concha Norway RVT  Examination Guidelines: A complete evaluation includes at minimum, Doppler waveform signals and systolic blood pressure reading at the level of bilateral brachial, anterior tibial, and posterior  tibial arteries, when vessel segments are accessible. Bilateral testing is considered an integral part of a complete examination. Photoelectric Plethysmograph (PPG) waveforms and toe systolic pressure readings are included as required and additional duplex testing as needed. Limited examinations for reoccurring indications may be performed as noted.  ABI Findings: +---------+------------------+-----+---------+--------+ Right    Rt Pressure (mmHg)IndexWaveform Comment  +---------+------------------+-----+---------+--------+ Brachial 169                                      +---------+------------------+-----+---------+--------+ ATA      180               1.07 triphasic         +---------+------------------+-----+---------+--------+ PTA      178               1.05 triphasic         +---------+------------------+-----+---------+--------+ Great Toe147               0.87 Normal            +---------+------------------+-----+---------+--------+ +---------+------------------+-----+---------+-------+ Left     Lt Pressure (mmHg)IndexWaveform Comment +---------+------------------+-----+---------+-------+ Brachial 169                                     +---------+------------------+-----+---------+-------+ ATA      181               1.07 triphasic        +---------+------------------+-----+---------+-------+ PTA      178               1.05 triphasic        +---------+------------------+-----+---------+-------+ Matthew Folks  0.85 Normal           +---------+------------------+-----+---------+-------+ +-------+-----------+-----------+------------+------------+ ABI/TBIToday's ABIToday's TBIPrevious ABIPrevious TBI +-------+-----------+-----------+------------+------------+ Right  1.07       .87        1.25        .77          +-------+-----------+-----------+------------+------------+ Left   1.07       .85        1.04        .99           +-------+-----------+-----------+------------+------------+ Bilateral ABIs and TBIs appear essentially unchanged compared to prior study on 03/2019.  Summary: Right: Resting right ankle-brachial index is within normal range. No evidence of significant right lower extremity arterial disease. The right toe-brachial index is normal. Left: Resting left ankle-brachial index is within normal range. No evidence of significant left lower extremity arterial disease. The left toe-brachial index is normal.  *See table(s) above for measurements and observations.  Electronically signed by Leotis Pain MD on 04/11/2020 at 12:26:34 PM.    Final        Assessment & Plan:   1. PAD (peripheral artery disease) (HCC)  Recommend:  The patient has evidence of atherosclerosis of the lower extremities with claudication.  The patient does not voice lifestyle limiting changes at this point in time.  Noninvasive studies do not suggest clinically significant change.  No invasive studies, angiography or surgery at this time The patient should continue walking and begin a more formal exercise program.  The patient should continue antiplatelet therapy and aggressive treatment of the lipid abnormalities  No changes in the patient's medications at this time  The patient should continue wearing graduated compression socks 10-15 mmHg strength to control the mild edema.    2. 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 edema, with long-term current use of insulin (HCC) Continue hypoglycemic medications as already ordered, these medications have been reviewed and there are no changes at this time.  Hgb A1C to be monitored as already arranged by primary service   3. Mixed hyperlipidemia Continue statin as ordered and reviewed, no changes at this time   4. Leg swelling We will have the patient return to the office for lower extremity venous reflux study. Given the fact  that this is been ongoing for several weeks with suspicion for DVT is low but cannot be completely ruled out. I suspect it may be related to something venous insufficiency. We will have the patient return convenience for ultrasound. Patient is also advised to resume conservative therapy including compression socks and elevation.   Current Outpatient Medications on File Prior to Visit  Medication Sig Dispense Refill  . Alcohol Swabs (ALCOHOL PREP) PADS Use twice daily prior to SQ injection of insulin to clean skin 100 each 6  . atorvastatin (LIPITOR) 80 MG tablet TAKE 1 TABLET BY MOUTH EVERY DAY 90 tablet 2  . Calcium Carbonate-Vitamin D 600-400 MG-UNIT tablet Take 2 tablets by mouth daily.    . Cyanocobalamin (B-12) 1000 MCG CAPS Take 1,000 mcg by mouth daily.     . insulin glargine (LANTUS SOLOSTAR) 100 UNIT/ML Solostar Pen Inject 65 Units into the skin 2 (two) times daily. Patient taking once a day    . INSULIN SYRINGE 1CC/29G 29G X 1/2" 1 ML MISC For lantus injections twice daily 200 each 1  . losartan (COZAAR) 25 MG tablet TAKE 1 TABLET BY MOUTH EVERY DAY 90 tablet 1  .  metoprolol succinate (TOPROL-XL) 25 MG 24 hr tablet Take by mouth.    . nystatin (MYCOSTATIN/NYSTOP) powder Apply 1 application topically 3 (three) times daily. 60 g 0  . omeprazole (PRILOSEC) 40 MG capsule TAKE 1 CAPSULE BY MOUTH EVERY DAY 90 capsule 1  . ondansetron (ZOFRAN) 4 MG tablet Take 1 tablet (4 mg total) by mouth every 8 (eight) hours as needed. 20 tablet 0  . OneTouch Delica Lancets 29J MISC To check blood sugar daily  DX: E11.9 100 each 5  . ONETOUCH VERIO test strip TO CHECK BLOOD SUGAR ONCE DAILY. 100 strip 11  . sodium chloride (OCEAN) 0.65 % nasal spray Use 2 sprays into each nostril five (5) times a day.    . TRULICITY 1.5 ME/2.6ST SOPN INJECT 1.5MG  INTO THE SKIN ONCE A WEEK AS DIRECTED 2 mL 5  . Vitamin D, Ergocalciferol, (DRISDOL) 1.25 MG (50000 UNIT) CAPS capsule TAKE ONE CAPSULE BY MOUTH ONCE WEEKLY ON  THE SAME DAY EACH WEEK (ON FRIDAYS) 12 capsule 1  . aspirin EC 81 MG tablet Take 81 mg daily by mouth. (Patient not taking: Reported on 04/09/2020)     No current facility-administered medications on file prior to visit.    There are no Patient Instructions on file for this visit. No follow-ups on file.   Kris Hartmann, NP

## 2020-04-21 ENCOUNTER — Ambulatory Visit (INDEPENDENT_AMBULATORY_CARE_PROVIDER_SITE_OTHER): Payer: Medicare Other | Admitting: Nurse Practitioner

## 2020-04-21 ENCOUNTER — Encounter (INDEPENDENT_AMBULATORY_CARE_PROVIDER_SITE_OTHER): Payer: Self-pay | Admitting: Nurse Practitioner

## 2020-04-21 ENCOUNTER — Ambulatory Visit (INDEPENDENT_AMBULATORY_CARE_PROVIDER_SITE_OTHER): Payer: Medicare Other

## 2020-04-21 ENCOUNTER — Other Ambulatory Visit: Payer: Self-pay

## 2020-04-21 VITALS — BP 114/76 | HR 93 | Resp 16 | Wt 151.4 lb

## 2020-04-21 DIAGNOSIS — E113293 Type 2 diabetes mellitus with mild nonproliferative diabetic retinopathy without macular edema, bilateral: Secondary | ICD-10-CM

## 2020-04-21 DIAGNOSIS — E782 Mixed hyperlipidemia: Secondary | ICD-10-CM

## 2020-04-21 DIAGNOSIS — R609 Edema, unspecified: Secondary | ICD-10-CM

## 2020-04-21 DIAGNOSIS — Z794 Long term (current) use of insulin: Secondary | ICD-10-CM | POA: Diagnosis not present

## 2020-04-29 ENCOUNTER — Ambulatory Visit (INDEPENDENT_AMBULATORY_CARE_PROVIDER_SITE_OTHER): Payer: Medicare Other | Admitting: Nurse Practitioner

## 2020-04-29 ENCOUNTER — Other Ambulatory Visit: Payer: Self-pay

## 2020-04-29 VITALS — BP 131/66 | HR 106 | Ht 60.0 in | Wt 149.0 lb

## 2020-04-29 DIAGNOSIS — R609 Edema, unspecified: Secondary | ICD-10-CM

## 2020-04-29 NOTE — Progress Notes (Signed)
History of Present Illness  There is no documented history at this time  Assessments & Plan   There are no diagnoses linked to this encounter.    Additional instructions  Subjective:  Patient presents with venous ulcer of the Left lower extremity.    Procedure:  3 layer unna wrap was placed Left lower extremity.   Plan:   Follow up in one week.  

## 2020-05-04 ENCOUNTER — Encounter (INDEPENDENT_AMBULATORY_CARE_PROVIDER_SITE_OTHER): Payer: Self-pay | Admitting: Nurse Practitioner

## 2020-05-05 ENCOUNTER — Encounter (INDEPENDENT_AMBULATORY_CARE_PROVIDER_SITE_OTHER): Payer: Self-pay | Admitting: Nurse Practitioner

## 2020-05-05 NOTE — Progress Notes (Signed)
Subjective:    Patient ID: Andrea [name], female    DOB: Oct 15, 1945, 75 y.o.   MRN: [id number] Chief Complaint  Patient presents with  . Follow-up    Ultrasound follow up    Andrea [name] is a 75 year old female that presents today for noninvasive studies related to left lower extremity leg swelling.  The patient's pain is complains of swelling in her left lower extremity.  Patient notes that she had a fall and subsequently had some swelling afterwards.  This is been ongoing for several weeks. The swelling is painful and is tender.  The patient denies any fever, chills, nausea, vomiting or diarrhea.  Today noninvasive studies show no evidence of DVT or superficial phlebitis seen in the left lower extremity.  No evidence of deep venous insufficiency or superficial venous reflux seen.   Review of Systems  Cardiovascular: Positive for leg swelling.  Musculoskeletal: Positive for gait problem.  All other systems reviewed and are negative.      Objective:   Physical Exam Vitals reviewed.  HENT:     Head: Normocephalic.  Musculoskeletal:     Left lower leg: Edema present.  Neurological:     Mental Status: She is alert and oriented to person, place, and time.     Motor: Weakness present.     Gait: Gait abnormal.  Psychiatric:        Mood and Affect: Mood normal.        Behavior: Behavior normal.        Thought Content: Thought content normal.        Judgment: Judgment normal.     BP 114/76 (BP Location: Right Arm)   Pulse 93   Resp 16   Wt 151 lb 6.4 oz (68.7 kg)   BMI 29.57 kg/m   Past Medical History:  Diagnosis Date  . Bronchitis   . Chickenpox   . Coronary artery disease   . Depression   . Diabetes mellitus type 2, uncomplicated ([place])   . Diabetes mellitus without complication ([place])   . Diabetic retinopathy ([place])   . Dupuytren contracture   . Frequent urinary tract infections   . GERD (gastroesophageal reflux disease)   . History of hand surgery 2011    left hand  . Hyperlipidemia, unspecified   . Irritable bowel syndrome   . MI (myocardial infarction) ([place]) 1994  . Neuromuscular disorder ([place])    nerve pain in back and legs  . Osteoporosis   . Pneumonia 2014  . Stroke ([place])   . TIA (transient ischemic attack)   . Vitamin D deficiency, unspecified   . Wheezing     Social History   Socioeconomic History  . Marital status: Married    Spouse name: Roselie Awkward  . Number of children: 3  . Years of education: 8  . Highest education level: 8th grade  Occupational History  . Occupation: retired  Tobacco Use  . Smoking status: Former Smoker    Packs/day: 1.00    Years: 30.00    Pack years: 30.00    Types: Cigarettes    Quit date: 07/17/2012    Years since quitting: 7.8  . Smokeless tobacco: Never Used  . Tobacco comment:    Vaping Use  . Vaping Use: Never used  Substance and Sexual Activity  . Alcohol use: Not Currently    Alcohol/week: 0.0 standard drinks  . Drug use: No  . Sexual activity: Not on file  Other Topics Concern  . Not on file  Social History Narrative  . Not on file   Social Determinants of Health   Financial Resource Strain: Low Risk   . Difficulty of Paying Living Expenses: Not hard at all  Food Insecurity: No Food Insecurity  . Worried About Charity fundraiser in the Last Year: Never true  . Ran Out of Food in the Last Year: Never true  Transportation Needs: No Transportation Needs  . Lack of Transportation (Medical): No  . Lack of Transportation (Non-Medical): No  Physical Activity: Inactive  . Days of Exercise per Week: 0 days  . Minutes of Exercise per Session: 0 min  Stress: No Stress Concern Present  . Feeling of Stress : Not at all  Social Connections: Moderately Isolated  . Frequency of Communication with Friends and Family: More than three times a week  . Frequency of Social Gatherings with Friends and Family: Once a week  . Attends Religious Services: Never  . Active Member of Clubs or  Organizations: No  . Attends Archivist Meetings: Never  . Marital Status: Married  Human resources officer Violence: Not At Risk  . Fear of Current or Ex-Partner: No  . Emotionally Abused: No  . Physically Abused: No  . Sexually Abused: No    Past Surgical History:  Procedure Laterality Date  . CARDIAC CATHETERIZATION  1994  . COLONOSCOPY     x3  . COLONOSCOPY WITH PROPOFOL N/A 02/08/2019   Procedure: COLONOSCOPY WITH PROPOFOL;  Surgeon: [name], MD;  Location: [facility] ENDOSCOPY;  Service: Gastroenterology;  Laterality: N/A;  . CORONARY ANGIOPLASTY  1994  . EYE SURGERY Bilateral    cataracts  . HAND SURGERY Left 2011   left hand release of contractures  . HIP FRACTURE SURGERY  11/2013  . PR COLSC FLX W/REMOVAL LESION BY HOT BX FORCEPS   08/21/2015   Procedure: COLONOSCOPY, FLEXIBLE, PROXIMAL TO SPLENIC FLEXURE; W/REMOVAL TUMOR/POLYP/OTHER LESION, HOT BX FORCEP/CAUTE; Surgeon: Carlena Hurl, MD; Location: OR [facility]; Service: General Surgery  . TEE WITHOUT CARDIOVERSION N/A 11/20/2019   Procedure: TRANSESOPHAGEAL ECHOCARDIOGRAM (TEE);  Surgeon: Nelva Bush, MD;  Location: [facility] ORS;  Service: Cardiovascular;  Laterality: N/A;    Family History  Problem Relation Age of Onset  . Depression Daughter   . Arthritis Daughter        spine  . Plantar fasciitis Daughter   . Anxiety disorder Daughter   . Hyperlipidemia Daughter   . AAA (abdominal aortic aneurysm) Daughter   . Hypertension Mother   . Stroke Mother   . Cataracts Mother   . Depression Mother   . Heart disease Mother   . Asthma Brother   . Diabetes Daughter   . Hyperlipidemia Daughter   . Hypertension Daughter   . Breast cancer Cousin        maternal    Allergies  Allergen Reactions  . Metformin And Related Diarrhea  . Sulfa Antibiotics Rash    CBC Latest Ref Rng & Units 02/11/2020 12/26/2019 11/24/2019  WBC 3.4 - 10.8 x10E3/uL 9.3 11.7(H) 10.1  Hemoglobin 11.1 - 15.9 g/dL 11.9 10.6(L)  9.8(L)  Hematocrit 34.0 - 46.6 % 37.0 32.5(L) 29.1(L)  Platelets 150 - 450 x10E3/uL 226 233 289      CMP     Component Value Date/Time   NA 142 02/11/2020 1441   NA 143 01/01/2014 0643   K 4.6 02/11/2020 1441   K 4.2 01/01/2014 0643   CL 105 02/11/2020 1441   CL 105 01/01/2014 0643   CO2  23 02/11/2020 1441   CO2 30 01/01/2014 0643   GLUCOSE 69 02/11/2020 1441   GLUCOSE 496 (H) 12/26/2019 1511   GLUCOSE 136 (H) 01/01/2014 0643   BUN 16 02/11/2020 1441   BUN 17 01/01/2014 0643   CREATININE 0.93 02/11/2020 1441   CREATININE 1.08 (H) 12/16/2016 1539   CALCIUM 9.4 02/11/2020 1441   CALCIUM 8.3 (L) 01/01/2014 0643   PROT 6.7 02/11/2020 1441   ALBUMIN 4.1 02/11/2020 1441   AST 15 02/11/2020 1441   ALT 8 02/11/2020 1441   ALKPHOS 124 (H) 02/11/2020 1441   BILITOT 0.4 02/11/2020 1441   GFRNONAA 61 02/11/2020 1441   GFRNONAA 20 (L) 12/26/2019 1511   GFRNONAA 52 (L) 12/16/2016 1539   GFRAA 70 02/11/2020 1441   GFRAA 60 12/16/2016 1539     VAS [place] ABI WITH/WO TBI  Result Date: 04/11/2020 LOWER EXTREMITY DOPPLER STUDY Indications: Peripheral artery disease.  Comparison Study: 03/22/2019 Performing Technologist: Concha Norway RVT  Examination Guidelines: A complete evaluation includes at minimum, Doppler waveform signals and systolic blood pressure reading at the level of bilateral brachial, anterior tibial, and posterior tibial arteries, when vessel segments are accessible. Bilateral testing is considered an integral part of a complete examination. Photoelectric Plethysmograph (PPG) waveforms and toe systolic pressure readings are included as required and additional duplex testing as needed. Limited examinations for reoccurring indications may be performed as noted.  ABI Findings: +---------+------------------+-----+---------+--------+ Right    Rt Pressure (mmHg)IndexWaveform Comment  +---------+------------------+-----+---------+--------+ Brachial 169                                       +---------+------------------+-----+---------+--------+ ATA      180               1.07 triphasic         +---------+------------------+-----+---------+--------+ PTA      178               1.05 triphasic         +---------+------------------+-----+---------+--------+ Great Toe147               0.87 Normal            +---------+------------------+-----+---------+--------+ +---------+------------------+-----+---------+-------+ Left     Lt Pressure (mmHg)IndexWaveform Comment +---------+------------------+-----+---------+-------+ Brachial 169                                     +---------+------------------+-----+---------+-------+ ATA      181               1.07 triphasic        +---------+------------------+-----+---------+-------+ PTA      178               1.05 triphasic        +---------+------------------+-----+---------+-------+ Great Toe143               0.85 Normal           +---------+------------------+-----+---------+-------+ +-------+-----------+-----------+------------+------------+ ABI/TBIToday's ABIToday's TBIPrevious ABIPrevious TBI +-------+-----------+-----------+------------+------------+ Right  1.07       .87        1.25        .77          +-------+-----------+-----------+------------+------------+ Left   1.07       .85        1.04        .99          +-------+-----------+-----------+------------+------------+  Bilateral ABIs and TBIs appear essentially unchanged compared to prior study on 03/2019.  Summary: Right: Resting right ankle-brachial index is within normal range. No evidence of significant right lower extremity arterial disease. The right toe-brachial index is normal. Left: Resting left ankle-brachial index is within normal range. No evidence of significant left lower extremity arterial disease. The left toe-brachial index is normal.  *See table(s) above for measurements and observations.  Electronically signed by  Leotis Pain MD on 04/11/2020 at 12:26:34 PM.    Final        Assessment & Plan:   1. Swelling No surgery or intervention at this point in time.     Patient will be placed in [employer] which will be changed weekly in order to gain control of her swelling.  In addition, behavioral modification including several periods of elevation of the lower extremities during the day will be continued. Achieving a position with the ankles at heart level was stressed to the patient  The patient is instructed to begin routine exercise, especially walking on a daily basis  Patient should undergo duplex ultrasound of the venous system to ensure that DVT or reflux is not present.  Following the review of the ultrasound the patient will follow up in four weeks to reassess the degree of swelling and the control that [place] therapy is offering.   The patient can be assessed for graduated compression stockings or wraps as well as a Lymph Pump once the ulcers are healed.   2. Mixed hyperlipidemia Continue statin as ordered and reviewed, no changes at this time   3. 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 edema, with long-term current use of insulin (HCC) Continue hypoglycemic medications as already ordered, these medications have been reviewed and there are no changes at this time.  Hgb A1C to be monitored as already arranged by primary service    Current Outpatient Medications on File Prior to Visit  Medication Sig Dispense Refill  . Alcohol Swabs (ALCOHOL PREP) PADS Use twice daily prior to SQ injection of insulin to clean skin 100 each 6  . atorvastatin (LIPITOR) 80 MG tablet TAKE 1 TABLET BY MOUTH EVERY DAY 90 tablet 2  . Calcium Carbonate-Vitamin D 600-400 MG-UNIT tablet Take 2 tablets by mouth daily.    . Cyanocobalamin (B-12) 1000 MCG CAPS Take 1,000 mcg by mouth daily.     [name] Kitchen HYDROcodone-acetaminophen (NORCO) 10-325 MG tablet Take 1 tablet  by mouth every 8 (eight) hours as needed. 90 tablet 0  . insulin glargine (LANTUS SOLOSTAR) 100 UNIT/ML Solostar Pen Inject 65 Units into the skin 2 (two) times daily. Patient taking once a day    . INSULIN SYRINGE 1CC/29G 29G X 1/2" 1 ML MISC For lantus injections twice daily 200 each 1  . losartan (COZAAR) 25 MG tablet TAKE 1 TABLET BY MOUTH EVERY DAY 90 tablet 1  . metoprolol succinate (TOPROL-XL) 25 MG 24 hr tablet Take by mouth.    . nystatin (MYCOSTATIN/NYSTOP) powder Apply 1 application topically 3 (three) times daily. 60 g 0  . omeprazole (PRILOSEC) 40 MG capsule TAKE 1 CAPSULE BY MOUTH EVERY DAY 90 capsule 1  . ondansetron (ZOFRAN) 4 MG tablet Take 1 tablet (4 mg total) by mouth every 8 (eight) hours as needed. 20 tablet 0  . OneTouch Delica Lancets 16R MISC To check blood sugar daily  DX: E11.9 100 each 5  . ONETOUCH VERIO test strip TO CHECK BLOOD SUGAR  ONCE DAILY. 100 strip 11  . oxybutynin (DITROPAN-XL) 10 MG 24 hr tablet TAKE 1 TABLET BY MOUTH EVERYDAY AT BEDTIME 90 tablet 0  . sodium chloride (OCEAN) 0.65 % nasal spray Use 2 sprays into each nostril five (5) times a day.    . TRULICITY 1.5 UN/2.7MB SOPN INJECT 1.5MG  INTO THE SKIN ONCE A WEEK AS DIRECTED 2 mL 5  . Vitamin D, Ergocalciferol, (DRISDOL) 1.25 MG (50000 UNIT) CAPS capsule TAKE ONE CAPSULE BY MOUTH ONCE WEEKLY ON THE SAME DAY EACH WEEK (ON FRIDAYS) 12 capsule 1  . aspirin EC 81 MG tablet Take 81 mg daily by mouth. (Patient not taking: No sig reported)    . cephALEXin (KEFLEX) 500 MG capsule Take 1 capsule (500 mg total) by mouth 3 (three) times daily. (Patient not taking: Reported on 04/21/2020) 15 capsule 0   No current facility-administered medications on file prior to visit.    There are no Patient Instructions on file for this visit. No follow-ups on file.   Kris Hartmann, NP

## 2020-05-06 ENCOUNTER — Other Ambulatory Visit: Payer: Self-pay

## 2020-05-06 ENCOUNTER — Encounter (INDEPENDENT_AMBULATORY_CARE_PROVIDER_SITE_OTHER): Payer: Self-pay

## 2020-05-06 ENCOUNTER — Ambulatory Visit (INDEPENDENT_AMBULATORY_CARE_PROVIDER_SITE_OTHER): Payer: Medicare Other | Admitting: Nurse Practitioner

## 2020-05-06 VITALS — BP 145/71 | HR 92 | Resp 16 | Wt 148.4 lb

## 2020-05-06 DIAGNOSIS — R609 Edema, unspecified: Secondary | ICD-10-CM | POA: Diagnosis not present

## 2020-05-06 NOTE — Progress Notes (Signed)
History of Present Illness  There is no documented history at this time  Assessments & Plan   There are no diagnoses linked to this encounter.    Additional instructions  Subjective:  Patient presents with venous ulcer of the Left lower extremity.    Procedure:  3 layer unna wrap was placed Left lower extremity.   Plan:   Follow up in one week.  

## 2020-05-12 ENCOUNTER — Ambulatory Visit: Payer: Self-pay | Admitting: Physician Assistant

## 2020-05-13 ENCOUNTER — Other Ambulatory Visit: Payer: Self-pay

## 2020-05-13 ENCOUNTER — Encounter (INDEPENDENT_AMBULATORY_CARE_PROVIDER_SITE_OTHER): Payer: Self-pay | Admitting: Nurse Practitioner

## 2020-05-13 ENCOUNTER — Ambulatory Visit (INDEPENDENT_AMBULATORY_CARE_PROVIDER_SITE_OTHER): Payer: Medicare Other | Admitting: Nurse Practitioner

## 2020-05-13 VITALS — BP 150/68 | HR 89 | Resp 16 | Wt 152.0 lb

## 2020-05-13 DIAGNOSIS — E782 Mixed hyperlipidemia: Secondary | ICD-10-CM

## 2020-05-13 DIAGNOSIS — I739 Peripheral vascular disease, unspecified: Secondary | ICD-10-CM | POA: Diagnosis not present

## 2020-05-13 DIAGNOSIS — M2170 Unequal limb length (acquired), unspecified site: Secondary | ICD-10-CM

## 2020-05-15 ENCOUNTER — Ambulatory Visit (INDEPENDENT_AMBULATORY_CARE_PROVIDER_SITE_OTHER): Payer: Medicare Other | Admitting: Physician Assistant

## 2020-05-15 ENCOUNTER — Encounter: Payer: Self-pay | Admitting: Physician Assistant

## 2020-05-15 ENCOUNTER — Other Ambulatory Visit: Payer: Self-pay

## 2020-05-15 VITALS — BP 147/73 | HR 82 | Temp 98.3°F | Resp 16 | Wt 151.8 lb

## 2020-05-15 DIAGNOSIS — M4316 Spondylolisthesis, lumbar region: Secondary | ICD-10-CM

## 2020-05-15 DIAGNOSIS — R2242 Localized swelling, mass and lump, left lower limb: Secondary | ICD-10-CM

## 2020-05-15 DIAGNOSIS — E113293 Type 2 diabetes mellitus with mild nonproliferative diabetic retinopathy without macular edema, bilateral: Secondary | ICD-10-CM

## 2020-05-15 DIAGNOSIS — Z794 Long term (current) use of insulin: Secondary | ICD-10-CM

## 2020-05-15 LAB — POCT GLYCOSYLATED HEMOGLOBIN (HGB A1C): Hemoglobin A1C: 7.3 % — AB (ref 4.0–5.6)

## 2020-05-15 MED ORDER — FUROSEMIDE 20 MG PO TABS: 20.0000 mg | ORAL_TABLET | Freq: Every day | ORAL | 3 refills | Status: DC

## 2020-05-15 MED ORDER — POTASSIUM CHLORIDE ER 10 MEQ PO TBCR
10.0000 meq | EXTENDED_RELEASE_TABLET | Freq: Every day | ORAL | 1 refills | Status: DC
Start: 2020-05-15 — End: 2020-06-05

## 2020-05-15 MED ORDER — HYDROCODONE-ACETAMINOPHEN 10-325 MG PO TABS: 1.0000 | ORAL_TABLET | Freq: Three times a day (TID) | ORAL | 0 refills | Status: DC | PRN

## 2020-05-15 NOTE — Progress Notes (Signed)
Established patient visit   Patient: Andrea [name]   DOB: Dec 29, 1945   75 y.o. Female  MRN: [id number] Visit Date: 05/15/2020  Today's healthcare provider: Mar Daring, PA-C   Chief Complaint  Patient presents with  . Diabetes  . Hyperlipidemia   Subjective    HPI  Diabetes Mellitus Type II, Follow-up  Lab Results  Component Value Date   HGBA1C 7.3 (A) 05/15/2020   HGBA1C 7.5 (H) 02/11/2020   HGBA1C 8.1 (A) 10/11/2019   Wt Readings from Last 3 Encounters:  05/15/20 151 lb 12.8 oz (68.9 kg)  05/13/20 152 lb (68.9 kg)  05/06/20 148 lb 6.4 oz (67.3 kg)   Last seen for diabetes 3 months ago.  Management since then includes none. She reports excellent compliance with treatment. She is not having side effects.  Symptoms: No fatigue No foot ulcerations  No appetite changes No nausea  No paresthesia of the feet  No polydipsia  No polyuria No visual disturbances   No vomiting     Home blood sugar records: recent blood sugar reading of 131  Episodes of hypoglycemia? No    Current insulin regiment: Lantus 65 units daily ( patient states that she is suppose to take twice a day but reports that she has only been taking once).  Most Recent Eye Exam: within the past 12 months Current exercise: none Current diet habits: well balanced  Pertinent Labs: Lab Results  Component Value Date   CHOL 115 02/11/2020   HDL 67 02/11/2020   LDLCALC 29 02/11/2020   TRIG 105 02/11/2020   CHOLHDL 1.7 02/11/2020   Lab Results  Component Value Date   NA 142 02/11/2020   K 4.6 02/11/2020   CREATININE 0.93 02/11/2020   GFRNONAA 61 02/11/2020   GFRAA 70 02/11/2020   GLUCOSE 69 02/11/2020     --------------------------------------------------------------------------------------------------- Lipid/Cholesterol, Follow-up  Last lipid panel Other pertinent labs  Lab Results  Component Value Date   CHOL 115 02/11/2020   HDL 67 02/11/2020   LDLCALC 29 02/11/2020    TRIG 105 02/11/2020   CHOLHDL 1.7 02/11/2020   Lab Results  Component Value Date   ALT 8 02/11/2020   AST 15 02/11/2020   PLT 226 02/11/2020   TSH 1.220 12/28/2018     She was last seen for this 3 months ago.  Management since that visit includes none.  She reports excellent compliance with treatment. She is not having side effects.   Symptoms: No chest pain No chest pressure/discomfort  No dyspnea Yes lower extremity edema  No numbness or tingling of extremity No orthopnea  No palpitations No paroxysmal nocturnal dyspnea  No speech difficulty No syncope   Current diet: well balanced Current exercise: none  The ASCVD Risk score ([place]., et al., 2013) failed to calculate for the following reasons:   The patient has a prior MI or stroke diagnosis  --------------------------------------------------------------------------------------------------- Patient Active Problem List   Diagnosis Date Noted  . Pain due to onychomycosis of toenail of left foot 03/06/2020  . Malaise and fatigue 12/28/2019  . UTI (urinary tract infection) 12/28/2019  . Hyponatremia 12/27/2019  . Intertrigo 12/27/2019  . Acute respiratory failure with hypoxia ([place]) 11/23/2019  . History of meningitis 11/23/2019  . 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 PAD (peripheral artery disease) ([place]) 12/28/2018  . Moderate episode of recurrent major depressive disorder ([place]) 12/19/2017  . Leg length discrepancy 09/21/2017  . Coronary artery disease involving native coronary  artery 01/06/2017  . Drug-induced constipation 01/06/2017  . Spinal stenosis of lumbar region 01/06/2017  . Spondylolisthesis of lumbar region 12/30/2016  . Obese 04/16/2015  . History of colon polyps 11/23/2012  . Bundle branch block, 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  . 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 Encounter for long-term (current) use of aspirin 11/23/2012  . Status  post percutaneous transluminal coronary angioplasty 11/23/2012  . Transient ischemic attack (TIA), and cerebral infarction without residual deficits(V12.54) 11/23/2012  . Anemia 08/17/2012  . Chronic obstructive pulmonary disease ([place]) 08/17/2012  . HLD (hyperlipidemia) 08/17/2012  . OP (osteoporosis) 08/17/2012  . 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 edema, with long-term current use of insulin ([place]) 08/17/2012  . Ischemic heart disease due to coronary artery obstruction ([place]) 07/24/2012   Past Medical History:  Diagnosis Date  . Bronchitis   . Chickenpox   . Coronary artery disease   . Depression   . Diabetes mellitus type 2, uncomplicated ([place])   . Diabetes mellitus without complication ([place])   . Diabetic retinopathy ([place])   . Dupuytren contracture   . Frequent urinary tract infections   . GERD (gastroesophageal reflux disease)   . History of hand surgery 2011   left hand  . Hyperlipidemia, unspecified   . Irritable bowel syndrome   . MI (myocardial infarction) ([place]) 1994  . Neuromuscular disorder ([place])    nerve pain in back and legs  . Osteoporosis   . Pneumonia 2014  . Stroke ([place])   . TIA (transient ischemic attack)   . Vitamin D deficiency, unspecified   . Wheezing    Allergies  Allergen Reactions  . Metformin And Related Diarrhea  . Sulfa Antibiotics Rash       Medications: Outpatient Medications Prior to Visit  Medication Sig  . Alcohol Swabs (ALCOHOL PREP) PADS Use twice daily prior to SQ injection of insulin to clean skin  . aspirin EC 81 MG tablet Take 81 mg daily by mouth. (Patient not taking: No sig reported)  . atorvastatin (LIPITOR) 80 MG tablet TAKE 1 TABLET BY MOUTH EVERY DAY  . Calcium Carbonate-Vitamin D 600-400 MG-UNIT tablet Take 2 tablets by mouth daily.  . Cyanocobalamin (B-12) 1000 MCG CAPS Take 1,000 mcg by mouth daily.   . insulin glargine (LANTUS SOLOSTAR) 100 UNIT/ML  Solostar Pen Inject 65 Units into the skin 2 (two) times daily. Patient taking once a day  . INSULIN SYRINGE 1CC/29G 29G X 1/2" 1 ML MISC For lantus injections twice daily  . losartan (COZAAR) 25 MG tablet TAKE 1 TABLET BY MOUTH EVERY DAY  . metoprolol succinate (TOPROL-XL) 25 MG 24 hr tablet Take by mouth.  . nystatin (MYCOSTATIN/NYSTOP) powder Apply 1 application topically 3 (three) times daily.  [name] Kitchen omeprazole (PRILOSEC) 40 MG capsule TAKE 1 CAPSULE BY MOUTH EVERY DAY  . ondansetron (ZOFRAN) 4 MG tablet Take 1 tablet (4 mg total) by mouth every 8 (eight) hours as needed.  Glory Rosebush Delica Lancets 52W MISC To check blood sugar daily  DX: E11.9  . ONETOUCH VERIO test strip TO CHECK BLOOD SUGAR ONCE DAILY.  [name] Kitchen oxybutynin (DITROPAN-XL) 10 MG 24 hr tablet TAKE 1 TABLET BY MOUTH EVERYDAY AT BEDTIME  . sodium chloride (OCEAN) 0.65 % nasal spray Use 2 sprays into each nostril five (5) times a day.  . TRULICITY 1.5 UX/3.2GM SOPN INJECT 1.5MG  INTO THE SKIN ONCE A WEEK AS DIRECTED  .  Vitamin D, Ergocalciferol, (DRISDOL) 1.25 MG (50000 UNIT) CAPS capsule TAKE ONE CAPSULE BY MOUTH ONCE WEEKLY ON THE SAME DAY EACH WEEK (ON FRIDAYS)  . [DISCONTINUED] cephALEXin (KEFLEX) 500 MG capsule Take 1 capsule (500 mg total) by mouth 3 (three) times daily. (Patient not taking: No sig reported)  . [DISCONTINUED] HYDROcodone-acetaminophen (NORCO) 10-325 MG tablet Take 1 tablet by mouth every 8 (eight) hours as needed.   No facility-administered medications prior to visit.    Review of Systems  Constitutional: Negative.   Respiratory: Negative.   Cardiovascular: Negative.   Endocrine: Negative for polydipsia, polyphagia and polyuria.  Neurological: Negative.     Last CBC Lab Results  Component Value Date   WBC 9.3 02/11/2020   HGB 11.9 02/11/2020   HCT 37.0 02/11/2020   MCV 90 02/11/2020   MCH 28.9 02/11/2020   RDW 13.8 02/11/2020   PLT 226 54/27/0623   Last metabolic panel Lab Results  Component Value  Date   GLUCOSE 69 02/11/2020   NA 142 02/11/2020   K 4.6 02/11/2020   CL 105 02/11/2020   CO2 23 02/11/2020   BUN 16 02/11/2020   CREATININE 0.93 02/11/2020   GFRNONAA 61 02/11/2020   GFRAA 70 02/11/2020   CALCIUM 9.4 02/11/2020   PROT 6.7 02/11/2020   ALBUMIN 4.1 02/11/2020   LABGLOB 2.6 02/11/2020   AGRATIO 1.6 02/11/2020   BILITOT 0.4 02/11/2020   ALKPHOS 124 (H) 02/11/2020   AST 15 02/11/2020   ALT 8 02/11/2020   ANIONGAP 12 12/26/2019       Objective    BP (!) 147/73   Pulse 82   Temp 98.3 F (36.8 C) (Oral)   Resp 16   Wt 151 lb 12.8 oz (68.9 kg)   SpO2 100%   BMI 29.65 kg/m  BP Readings from Last 3 Encounters:  05/15/20 (!) 147/73  05/13/20 (!) 150/68  05/06/20 (!) 145/71   Wt Readings from Last 3 Encounters:  05/15/20 151 lb 12.8 oz (68.9 kg)  05/13/20 152 lb (68.9 kg)  05/06/20 148 lb 6.4 oz (67.3 kg)       Physical Exam Vitals reviewed.  Constitutional:      General: She is not in acute distress.    Appearance: Normal appearance. She is well-developed, well-groomed and overweight. She is not ill-appearing or diaphoretic.  Cardiovascular:     Rate and Rhythm: Normal rate and regular rhythm.     Pulses: Normal pulses.     Heart sounds: Normal heart sounds. No murmur heard. No friction rub. No gallop.   Pulmonary:     Effort: Pulmonary effort is normal. No respiratory distress.     Breath sounds: Normal breath sounds. No wheezing or rales.  Musculoskeletal:     Cervical back: Normal range of motion and neck supple.  Neurological:     Mental Status: She is alert.  Psychiatric:        Behavior: Behavior is cooperative.     Results for orders placed or performed in visit on 05/15/20  POCT glycosylated hemoglobin (Hb A1C)  Result Value Ref Range   Hemoglobin A1C 7.3 (A) 4.0 - 5.6 %   HbA1c POC (<> result, manual entry)     HbA1c, POC (prediabetic range)     HbA1c, POC (controlled diabetic range)      Assessment & Plan     1. Type 2  diabetes mellitus with both eyes affected by mild nonproliferative retinopathy without macular edema, with long-term current use of insulin (  [place]) A1c doing well and down to 7.3. Continue current regiment. F/U in 3 months.  - POCT glycosylated hemoglobin (Hb A1C)  2. Localized swelling of left lower extremity Worsening. Furosemide prescribed as below. Take potassium with furosemide. F/U in 3 weeks.  - furosemide (LASIX) 20 MG tablet; Take 1 tablet (20 mg total) by mouth daily.  Dispense: 30 tablet; Refill: 3 - potassium chloride (KLOR-CON) 10 MEQ tablet; Take 1 tablet (10 mEq total) by mouth daily.  Dispense: 30 tablet; Refill: 1  3. Spondylolisthesis of lumbar region Stable. Diagnosis pulled for medication refill. Continue current medical treatment plan. - HYDROcodone-acetaminophen (NORCO) 10-325 MG tablet; Take 1 tablet by mouth every 8 (eight) hours as needed.  Dispense: 90 tablet; Refill: 0   No follow-ups on file.      Reynolds Bowl, PA-C, have reviewed all documentation for this visit. The documentation on 05/25/20 for the exam, diagnosis, procedures, and orders are all accurate and complete.   Rubye Beach  [facility] [phone] (phone) [phone] (fax)  [place]

## 2020-05-17 ENCOUNTER — Encounter (INDEPENDENT_AMBULATORY_CARE_PROVIDER_SITE_OTHER): Payer: Self-pay | Admitting: Nurse Practitioner

## 2020-05-17 NOTE — Progress Notes (Signed)
Subjective:    Patient ID: Andrea [name], female    DOB: [birth year], 75 y.o.   MRN: [id number] Chief Complaint  Patient presents with  . Follow-up    Unna wrap check    The patient presents today for interval evaluation of her left lower extremity.  The patient notes that the swelling is not greatly decreased.  However but does not necessarily feel swollen.  It feels more "tight" than anything.  The patient has also had a previous history of a hip replacement on her left side as well.  The leg is not necessarily painful and the swelling is not extensive.  There is no wounds or ulcerations.  The patient does not note any fever or chills.   Review of Systems  Cardiovascular: Negative for leg swelling.  Musculoskeletal: Positive for arthralgias and gait problem.  All other systems reviewed and are negative.      Objective:   Physical Exam Vitals reviewed.  HENT:     Head: Normocephalic.  Cardiovascular:     Rate and Rhythm: Normal rate.     Pulses: Normal pulses.  Pulmonary:     Effort: Pulmonary effort is normal.  Skin:    General: Skin is warm and dry.  Neurological:     Mental Status: She is alert and oriented to person, place, and time.     Gait: Gait abnormal.  Psychiatric:        Mood and Affect: Mood normal.        Behavior: Behavior normal.        Thought Content: Thought content normal.        Judgment: Judgment normal.     BP (!) 150/68 (BP Location: Right Arm)   Pulse 89   Resp 16   Wt 152 lb (68.9 kg)   BMI 29.69 kg/m   Past Medical History:  Diagnosis Date  . Bronchitis   . Chickenpox   . Coronary artery disease   . Depression   . Diabetes mellitus type 2, uncomplicated ([place])   . Diabetes mellitus without complication ([place])   . Diabetic retinopathy ([place])   . Dupuytren contracture   . Frequent urinary tract infections   . GERD (gastroesophageal reflux disease)   . History of hand surgery 2011   left hand  . Hyperlipidemia, unspecified   .  Irritable bowel syndrome   . MI (myocardial infarction) ([place]) 1994  . Neuromuscular disorder ([place])    nerve pain in back and legs  . Osteoporosis   . Pneumonia 2014  . Stroke ([place])   . TIA (transient ischemic attack)   . Vitamin D deficiency, unspecified   . Wheezing     Social History   Socioeconomic History  . Marital status: Married    Spouse name: Roselie Awkward  . Number of children: 3  . Years of education: 8  . Highest education level: 8th grade  Occupational History  . Occupation: retired  Tobacco Use  . Smoking status: Former Smoker    Packs/day: 1.00    Years: 30.00    Pack years: 30.00    Types: Cigarettes    Quit date: 07/17/2012    Years since quitting: 7.8  . Smokeless tobacco: Never Used  . Tobacco comment:    Vaping Use  . Vaping Use: Never used  Substance and Sexual Activity  . Alcohol use: Not Currently    Alcohol/week: 0.0 standard drinks  . Drug use: No  . Sexual activity: Not on file  Other Topics Concern  . Not on file  Social History Narrative  . Not on file   Social Determinants of Health   Financial Resource Strain: Low Risk   . Difficulty of Paying Living Expenses: Not hard at all  Food Insecurity: No Food Insecurity  . Worried About Charity fundraiser in the Last Year: Never true  . Ran Out of Food in the Last Year: Never true  Transportation Needs: No Transportation Needs  . Lack of Transportation (Medical): No  . Lack of Transportation (Non-Medical): No  Physical Activity: Inactive  . Days of Exercise per Week: 0 days  . Minutes of Exercise per Session: 0 min  Stress: No Stress Concern Present  . Feeling of Stress : Not at all  Social Connections: Moderately Isolated  . Frequency of Communication with Friends and Family: More than three times a week  . Frequency of Social Gatherings with Friends and Family: Once a week  . Attends Religious Services: Never  . Active Member of Clubs or Organizations: No  . Attends Theatre manager Meetings: Never  . Marital Status: Married  Human resources officer Violence: Not At Risk  . Fear of Current or Ex-Partner: No  . Emotionally Abused: No  . Physically Abused: No  . Sexually Abused: No    Past Surgical History:  Procedure Laterality Date  . CARDIAC CATHETERIZATION  1994  . COLONOSCOPY     x3  . COLONOSCOPY WITH PROPOFOL N/A 02/08/2019   Procedure: COLONOSCOPY WITH PROPOFOL;  Surgeon: [name], MD;  Location: [facility];  Service: Gastroenterology;  Laterality: N/A;  . CORONARY ANGIOPLASTY  1994  . EYE SURGERY Bilateral    cataracts  . HAND SURGERY Left 2011   left hand release of contractures  . HIP FRACTURE SURGERY  11/2013  . PR COLSC FLX W/REMOVAL LESION BY HOT BX FORCEPS   08/21/2015   Procedure: COLONOSCOPY, FLEXIBLE, PROXIMAL TO SPLENIC FLEXURE; W/REMOVAL TUMOR/POLYP/OTHER LESION, HOT BX FORCEP/CAUTE; Surgeon: Carlena Hurl, MD; Location: [facility]; Service: General Surgery  . TEE WITHOUT CARDIOVERSION N/A 11/20/2019   Procedure: TRANSESOPHAGEAL ECHOCARDIOGRAM (TEE);  Surgeon: Nelva Bush, MD;  Location: [facility] ORS;  Service: Cardiovascular;  Laterality: N/A;    Family History  Problem Relation Age of Onset  . Depression Daughter   . Arthritis Daughter        spine  . Plantar fasciitis Daughter   . Anxiety disorder Daughter   . Hyperlipidemia Daughter   . AAA (abdominal aortic aneurysm) Daughter   . Hypertension Mother   . Stroke Mother   . Cataracts Mother   . Depression Mother   . Heart disease Mother   . Asthma Brother   . Diabetes Daughter   . Hyperlipidemia Daughter   . Hypertension Daughter   . Breast cancer Cousin        maternal    Allergies  Allergen Reactions  . Metformin And Related Diarrhea  . Sulfa Antibiotics Rash    CBC Latest Ref Rng & Units 02/11/2020 12/26/2019 11/24/2019  WBC 3.4 - 10.8 x10E3/uL 9.3 11.7(H) 10.1  Hemoglobin 11.1 - 15.9 g/dL 11.9 10.6(L) 9.8(L)  Hematocrit 34.0 - 46.6 % 37.0  32.5(L) 29.1(L)  Platelets 150 - 450 x10E3/uL 226 233 289      CMP     Component Value Date/Time   NA 142 02/11/2020 1441   NA 143 01/01/2014 0643   K 4.6 02/11/2020 1441   K 4.2 01/01/2014 0643   CL 105 02/11/2020 1441  CL 105 01/01/2014 0643   CO2 23 02/11/2020 1441   CO2 30 01/01/2014 0643   GLUCOSE 69 02/11/2020 1441   GLUCOSE 496 (H) 12/26/2019 1511   GLUCOSE 136 (H) 01/01/2014 0643   BUN 16 02/11/2020 1441   BUN 17 01/01/2014 0643   CREATININE 0.93 02/11/2020 1441   CREATININE 1.08 (H) 12/16/2016 1539   CALCIUM 9.4 02/11/2020 1441   CALCIUM 8.3 (L) 01/01/2014 0643   PROT 6.7 02/11/2020 1441   ALBUMIN 4.1 02/11/2020 1441   AST 15 02/11/2020 1441   ALT 8 02/11/2020 1441   ALKPHOS 124 (H) 02/11/2020 1441   BILITOT 0.4 02/11/2020 1441   GFRNONAA 61 02/11/2020 1441   GFRNONAA 20 (L) 12/26/2019 1511   GFRNONAA 52 (L) 12/16/2016 1539   GFRAA 70 02/11/2020 1441   GFRAA 60 12/16/2016 1539     VAS [place] ABI WITH/WO TBI  Result Date: 04/11/2020 LOWER EXTREMITY DOPPLER STUDY Indications: Peripheral artery disease.  Comparison Study: 03/22/2019 Performing Technologist: Concha Norway RVT  Examination Guidelines: A complete evaluation includes at minimum, Doppler waveform signals and systolic blood pressure reading at the level of bilateral brachial, anterior tibial, and posterior tibial arteries, when vessel segments are accessible. Bilateral testing is considered an integral part of a complete examination. Photoelectric Plethysmograph (PPG) waveforms and toe systolic pressure readings are included as required and additional duplex testing as needed. Limited examinations for reoccurring indications may be performed as noted.  ABI Findings: +---------+------------------+-----+---------+--------+ Right    Rt Pressure (mmHg)IndexWaveform Comment  +---------+------------------+-----+---------+--------+ Brachial 169                                       +---------+------------------+-----+---------+--------+ ATA      180               1.07 triphasic         +---------+------------------+-----+---------+--------+ PTA      178               1.05 triphasic         +---------+------------------+-----+---------+--------+ Great Toe147               0.87 Normal            +---------+------------------+-----+---------+--------+ +---------+------------------+-----+---------+-------+ Left     Lt Pressure (mmHg)IndexWaveform Comment +---------+------------------+-----+---------+-------+ Brachial 169                                     +---------+------------------+-----+---------+-------+ ATA      181               1.07 triphasic        +---------+------------------+-----+---------+-------+ PTA      178               1.05 triphasic        +---------+------------------+-----+---------+-------+ Great Toe143               0.85 Normal           +---------+------------------+-----+---------+-------+ +-------+-----------+-----------+------------+------------+ ABI/TBIToday's ABIToday's TBIPrevious ABIPrevious TBI +-------+-----------+-----------+------------+------------+ Right  1.07       .87        1.25        .77          +-------+-----------+-----------+------------+------------+ Left   1.07       .85        1.04        .  99          +-------+-----------+-----------+------------+------------+ Bilateral ABIs and TBIs appear essentially unchanged compared to prior study on 03/2019.  Summary: Right: Resting right ankle-brachial index is within normal range. No evidence of significant right lower extremity arterial disease. The right toe-brachial index is normal. Left: Resting left ankle-brachial index is within normal range. No evidence of significant left lower extremity arterial disease. The left toe-brachial index is normal.  *See table(s) above for measurements and observations.  Electronically signed by Leotis Pain  MD on 04/11/2020 at 12:26:34 PM.    Final        Assessment & Plan:   1. PAD (peripheral artery disease) (HCC) Previous noninvasive studies show that her PAD is stable.  Patient will continue with routine annual follow-up.  Patient will follow up in 8 months which is 1 year from her most recent studies.  Patient advised to contact us sooner if she begins to have lower extremity pain or ulcerations.  2. Leg length discrepancy This may be related to the patient's hip replacement, as well as a cause for the tight feeling.  Patient is advised to discuss with orthopedic physician to see if this may be related.  3. Mixed hyperlipidemia Continue statin as ordered and reviewed, no changes at this time    Current Outpatient Medications on File Prior to Visit  Medication Sig Dispense Refill  . Alcohol Swabs (ALCOHOL PREP) PADS Use twice daily prior to SQ injection of insulin to clean skin 100 each 6  . atorvastatin (LIPITOR) 80 MG tablet TAKE 1 TABLET BY MOUTH EVERY DAY 90 tablet 2  . Calcium Carbonate-Vitamin D 600-400 MG-UNIT tablet Take 2 tablets by mouth daily.    . Cyanocobalamin (B-12) 1000 MCG CAPS Take 1,000 mcg by mouth daily.     . insulin glargine (LANTUS SOLOSTAR) 100 UNIT/ML Solostar Pen Inject 65 Units into the skin 2 (two) times daily. Patient taking once a day    . INSULIN SYRINGE 1CC/29G 29G X 1/2" 1 ML MISC For lantus injections twice daily 200 each 1  . losartan (COZAAR) 25 MG tablet TAKE 1 TABLET BY MOUTH EVERY DAY 90 tablet 1  . metoprolol succinate (TOPROL-XL) 25 MG 24 hr tablet Take by mouth.    . nystatin (MYCOSTATIN/NYSTOP) powder Apply 1 application topically 3 (three) times daily. 60 g 0  . omeprazole (PRILOSEC) 40 MG capsule TAKE 1 CAPSULE BY MOUTH EVERY DAY 90 capsule 1  . ondansetron (ZOFRAN) 4 MG tablet Take 1 tablet (4 mg total) by mouth every 8 (eight) hours as needed. 20 tablet 0  . OneTouch Delica Lancets 17E MISC To check blood sugar daily  DX: E11.9 100 each 5   . ONETOUCH VERIO test strip TO CHECK BLOOD SUGAR ONCE DAILY. 100 strip 11  . oxybutynin (DITROPAN-XL) 10 MG 24 hr tablet TAKE 1 TABLET BY MOUTH EVERYDAY AT BEDTIME 90 tablet 0  . sodium chloride (OCEAN) 0.65 % nasal spray Use 2 sprays into each nostril five (5) times a day.    . TRULICITY 1.5 YC/1.4GY SOPN INJECT 1.5MG  INTO THE SKIN ONCE A WEEK AS DIRECTED 2 mL 5  . Vitamin D, Ergocalciferol, (DRISDOL) 1.25 MG (50000 UNIT) CAPS capsule TAKE ONE CAPSULE BY MOUTH ONCE WEEKLY ON THE SAME DAY EACH WEEK (ON FRIDAYS) 12 capsule 1  . aspirin EC 81 MG tablet Take 81 mg daily by mouth. (Patient not taking: No sig reported)     No current facility-administered medications on file prior to  visit.    There are no Patient Instructions on file for this visit. No follow-ups on file.   Kris Hartmann, NP

## 2020-05-23 ENCOUNTER — Other Ambulatory Visit: Payer: Self-pay | Admitting: Physician Assistant

## 2020-05-23 DIAGNOSIS — N3281 Overactive bladder: Secondary | ICD-10-CM

## 2020-05-25 ENCOUNTER — Encounter: Payer: Self-pay | Admitting: Physician Assistant

## 2020-05-29 ENCOUNTER — Other Ambulatory Visit: Payer: Self-pay | Admitting: Physician Assistant

## 2020-05-29 DIAGNOSIS — I10 Essential (primary) hypertension: Secondary | ICD-10-CM

## 2020-05-29 NOTE — Telephone Encounter (Signed)
Requested medications are due for refill today.  unknown  Requested medications are on the active medications list.  yes  Last refill. unknown  Future visit scheduled.   No  Notes to clinic. Historical medication - No rx.

## 2020-05-30 ENCOUNTER — Other Ambulatory Visit: Payer: Self-pay | Admitting: Orthopedic Surgery

## 2020-05-30 ENCOUNTER — Other Ambulatory Visit (HOSPITAL_COMMUNITY): Payer: Self-pay | Admitting: Orthopedic Surgery

## 2020-05-30 DIAGNOSIS — M25552 Pain in left hip: Secondary | ICD-10-CM | POA: Diagnosis not present

## 2020-05-30 DIAGNOSIS — T8484XA Pain due to internal orthopedic prosthetic devices, implants and grafts, initial encounter: Secondary | ICD-10-CM

## 2020-05-30 DIAGNOSIS — I739 Peripheral vascular disease, unspecified: Secondary | ICD-10-CM | POA: Diagnosis not present

## 2020-05-30 DIAGNOSIS — E113293 Type 2 diabetes mellitus with mild nonproliferative diabetic retinopathy without macular edema, bilateral: Secondary | ICD-10-CM | POA: Diagnosis not present

## 2020-05-30 DIAGNOSIS — Z794 Long term (current) use of insulin: Secondary | ICD-10-CM | POA: Diagnosis not present

## 2020-06-02 ENCOUNTER — Telehealth: Payer: Self-pay

## 2020-06-02 NOTE — Telephone Encounter (Signed)
Copied from [place] [phone]. Topic: Appointment Scheduling - Scheduling Inquiry for Clinic >> Jun 02, 2020  9:52 AM Loma [place] wrote: Reason for CRM: Tawanna Sat had told pt about middle of this month to make  an appt for the last time to fu on some issues.. CT Scan on hip replacement.. no appts available. Really wanted a last appt. Pt wanted to send Tawanna Sat the request as she is sure she will want to see her. [phone]

## 2020-06-02 NOTE — Telephone Encounter (Signed)
She can be schedule in one of the hospital visits or can be scheduled on 4/7. That day is on hold for patients that want to say goodbye

## 2020-06-02 NOTE — Telephone Encounter (Signed)
Apt 06/05/2020 at 11am  Thanks,   -Mickel Baas

## 2020-06-05 ENCOUNTER — Other Ambulatory Visit: Payer: Self-pay

## 2020-06-05 ENCOUNTER — Ambulatory Visit (INDEPENDENT_AMBULATORY_CARE_PROVIDER_SITE_OTHER): Payer: Medicare Other | Admitting: Physician Assistant

## 2020-06-05 ENCOUNTER — Encounter: Payer: Self-pay | Admitting: Physician Assistant

## 2020-06-05 DIAGNOSIS — R2242 Localized swelling, mass and lump, left lower limb: Secondary | ICD-10-CM

## 2020-06-05 MED ORDER — FUROSEMIDE 20 MG PO TABS: 20.0000 mg | ORAL_TABLET | Freq: Every day | ORAL | 1 refills | Status: DC

## 2020-06-05 MED ORDER — POTASSIUM CHLORIDE ER 10 MEQ PO TBCR: 10.0000 meq | EXTENDED_RELEASE_TABLET | Freq: Every day | ORAL | 1 refills | Status: DC

## 2020-06-05 NOTE — Progress Notes (Signed)
Established patient visit   Patient: Andrea [name]   DOB: 1945-07-12   75 y.o. Female  MRN: [id number] Visit Date: 06/05/2020  Today's healthcare provider: Mar Daring, PA-C   Chief Complaint  Patient presents with  . Edema   Subjective    HPI   Follow up for edema  The patient was last seen for this 3 weeks ago. Changes made at last visit include started furosemide and potassium.  She reports excellent compliance with treatment. She feels that condition is Improved. She is not having side effects.   -----------------------------------------------------------------------------------------    Patient Active Problem List   Diagnosis Date Noted  . Pain due to onychomycosis of toenail of left foot 03/06/2020  . Malaise and fatigue 12/28/2019  . UTI (urinary tract infection) 12/28/2019  . Hyponatremia 12/27/2019  . Intertrigo 12/27/2019  . Acute respiratory failure with hypoxia ([place]) 11/23/2019  . History of meningitis 11/23/2019  . 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 PAD (peripheral artery disease) ([place]) 12/28/2018  . Moderate episode of recurrent major depressive disorder ([place]) 12/19/2017  . Leg length discrepancy 09/21/2017  . Coronary artery disease involving native coronary artery 01/06/2017  . Drug-induced constipation 01/06/2017  . Spinal stenosis of lumbar region 01/06/2017  . Spondylolisthesis of lumbar region 12/30/2016  . Obese 04/16/2015  . History of colon polyps 11/23/2012  . Bundle branch block, 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  . 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 Encounter for long-term (current) use of aspirin 11/23/2012  . Status post percutaneous transluminal coronary angioplasty 11/23/2012  . Transient ischemic attack (TIA), and cerebral infarction without residual deficits(V12.54) 11/23/2012  . Anemia 08/17/2012  . Chronic obstructive pulmonary disease ([place])  08/17/2012  . HLD (hyperlipidemia) 08/17/2012  . OP (osteoporosis) 08/17/2012  . 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 edema, with long-term current use of insulin ([place]) 08/17/2012  . Ischemic heart disease due to coronary artery obstruction ([place]) 07/24/2012   Past Medical History:  Diagnosis Date  . Bronchitis   . Chickenpox   . Coronary artery disease   . Depression   . Diabetes mellitus type 2, uncomplicated ([place])   . Diabetes mellitus without complication ([place])   . Diabetic retinopathy ([place])   . Dupuytren contracture   . Frequent urinary tract infections   . GERD (gastroesophageal reflux disease)   . History of hand surgery 2011   left hand  . Hyperlipidemia, unspecified   . Irritable bowel syndrome   . MI (myocardial infarction) ([place]) 1994  . Neuromuscular disorder ([place])    nerve pain in back and legs  . Osteoporosis   . Pneumonia 2014  . Stroke ([place])   . TIA (transient ischemic attack)   . Vitamin D deficiency, unspecified   . Wheezing    Social History   Tobacco Use  . Smoking status: Former Smoker    Packs/day: 1.00    Years: 30.00    Pack years: 30.00    Types: Cigarettes    Quit date: 07/17/2012    Years since quitting: 7.8  . Smokeless tobacco: Never Used  . Tobacco comment:    Vaping Use  . Vaping Use: Never used  Substance Use Topics  . Alcohol use: Not Currently    Alcohol/week: 0.0 standard drinks  . Drug use: No   Allergies  Allergen Reactions  . Metformin And Related Diarrhea  . Sulfa Antibiotics Rash  Medications: Outpatient Medications Prior to Visit  Medication Sig  . Alcohol Swabs (ALCOHOL PREP) PADS Use twice daily prior to SQ injection of insulin to clean skin  . aspirin EC 81 MG tablet Take 81 mg by mouth daily.  [name] Kitchen atorvastatin (LIPITOR) 80 MG tablet TAKE 1 TABLET BY MOUTH EVERY DAY  . Calcium Carbonate-Vitamin D 600-400 MG-UNIT tablet Take 2 tablets by mouth daily.   . Cyanocobalamin (B-12) 1000 MCG CAPS Take 1,000 mcg by mouth daily.   . furosemide (LASIX) 20 MG tablet Take 1 tablet (20 mg total) by mouth daily.  [name] Kitchen HYDROcodone-acetaminophen (NORCO) 10-325 MG tablet Take 1 tablet by mouth every 8 (eight) hours as needed.  . insulin glargine (LANTUS SOLOSTAR) 100 UNIT/ML Solostar Pen Inject 65 Units into the skin 2 (two) times daily. Patient taking once a day  . INSULIN SYRINGE 1CC/29G 29G X 1/2" 1 ML MISC For lantus injections twice daily  . losartan (COZAAR) 25 MG tablet TAKE 1 TABLET BY MOUTH EVERY DAY  . metoprolol succinate (TOPROL-XL) 25 MG 24 hr tablet TAKE 1 TABLET BY MOUTH EVERY DAY  . nystatin (MYCOSTATIN/NYSTOP) powder Apply 1 application topically 3 (three) times daily.  [name] Kitchen omeprazole (PRILOSEC) 40 MG capsule TAKE 1 CAPSULE BY MOUTH EVERY DAY  . ondansetron (ZOFRAN) 4 MG tablet Take 1 tablet (4 mg total) by mouth every 8 (eight) hours as needed.  Glory Rosebush Delica Lancets 17B MISC To check blood sugar daily  DX: E11.9  . ONETOUCH VERIO test strip TO CHECK BLOOD SUGAR ONCE DAILY.  [name] Kitchen oxybutynin (DITROPAN-XL) 10 MG 24 hr tablet TAKE 1 TABLET BY MOUTH EVERYDAY AT BEDTIME  . potassium chloride (KLOR-CON) 10 MEQ tablet Take 1 tablet (10 mEq total) by mouth daily.  . sodium chloride (OCEAN) 0.65 % nasal spray Use 2 sprays into each nostril five (5) times a day.  . TRULICITY 1.5 LT/9.0ZE SOPN INJECT 1.5MG  INTO THE SKIN ONCE A WEEK AS DIRECTED  . Vitamin D, Ergocalciferol, (DRISDOL) 1.25 MG (50000 UNIT) CAPS capsule TAKE ONE CAPSULE BY MOUTH ONCE WEEKLY ON THE SAME DAY EACH WEEK (ON FRIDAYS)   No facility-administered medications prior to visit.    Review of Systems  Constitutional: Negative.   Respiratory: Negative.   Cardiovascular: Positive for leg swelling. Negative for chest pain and palpitations.  Gastrointestinal: Negative.   Neurological: Negative for dizziness, light-headedness and headaches.        Objective    BP 127/78 (BP  Location: Left Arm, Patient Position: Sitting, Cuff Size: Normal)   Pulse (!) 108   Temp 97.9 F (36.6 C) (Oral)   Wt 145 lb (65.8 kg)   BMI 28.32 kg/m    Physical Exam Vitals reviewed.  Constitutional:      General: She is not in acute distress.    Appearance: Normal appearance. She is well-developed. She is not diaphoretic.  Cardiovascular:     Rate and Rhythm: Normal rate and regular rhythm.     Heart sounds: Normal heart sounds. No murmur heard. No friction rub. No gallop.   Pulmonary:     Effort: Pulmonary effort is normal. No respiratory distress.     Breath sounds: Normal breath sounds. No wheezing or rales.  Musculoskeletal:     Cervical back: Normal range of motion and neck supple.  Neurological:     Mental Status: She is alert.      No results found for any visits on 06/05/20.  Assessment & Plan     1. Localized swelling  of left lower extremity Stable. Diagnosis pulled for medication refill. Continue current medical treatment plan. - potassium chloride (KLOR-CON) 10 MEQ tablet; Take 1 tablet (10 mEq total) by mouth daily.  Dispense: 90 tablet; Refill: 1 - furosemide (LASIX) 20 MG tablet; Take 1 tablet (20 mg total) by mouth daily.  Dispense: 90 tablet; Refill: 1   No follow-ups on file.      Reynolds Bowl, PA-C, have reviewed all documentation for this visit. The documentation on 06/15/20 for the exam, diagnosis, procedures, and orders are all accurate and complete.   [place]  [facility] [phone] (phone) [phone] (fax)  [place]

## 2020-06-07 ENCOUNTER — Other Ambulatory Visit: Payer: Self-pay | Admitting: Physician Assistant

## 2020-06-07 DIAGNOSIS — N3281 Overactive bladder: Secondary | ICD-10-CM

## 2020-06-15 ENCOUNTER — Encounter: Payer: Self-pay | Admitting: Physician Assistant

## 2020-06-16 ENCOUNTER — Other Ambulatory Visit: Payer: Self-pay | Admitting: Physician Assistant

## 2020-06-16 DIAGNOSIS — M4316 Spondylolisthesis, lumbar region: Secondary | ICD-10-CM

## 2020-06-16 MED ORDER — HYDROCODONE-ACETAMINOPHEN 10-325 MG PO TABS: 1.0000 | ORAL_TABLET | Freq: Three times a day (TID) | ORAL | 0 refills | Status: DC | PRN

## 2020-06-16 NOTE — Telephone Encounter (Signed)
Medication: HYDROcodone-acetaminophen (NORCO) 10-325 MG tablet [[id number]]   Has the patient contacted their pharmacy? YES (Agent: If no, request that the patient contact the pharmacy for the refill.) (Agent: If yes, when and what did the pharmacy advise?)  Preferred Pharmacy (with phone number or street name): [id number] [place] [place] [address] [place] [address] Phone: [phone] Fax: [phone] Hours: Not open 24 hours    Agent: Please be advised that RX refills may take up to 3 business days. We ask that you follow-up with your pharmacy.

## 2020-06-16 NOTE — Telephone Encounter (Signed)
Requested medication (s) are due for refill today: yes  Requested medication (s) are on the active medication list: yes  Last refill:  05/15/20 #90 0 refills  Future visit scheduled: yes  Notes to clinic:  not delegated per protocol     Requested Prescriptions  Pending Prescriptions Disp Refills   HYDROcodone-acetaminophen (NORCO) 10-325 MG tablet 90 tablet 0    Sig: Take 1 tablet by mouth every 8 (eight) hours as needed.      Not Delegated - Analgesics:  Opioid Agonist Combinations Failed - 06/16/2020 12:27 PM      Failed - This refill cannot be delegated      Failed - Urine Drug Screen completed in last 360 days      Passed - Valid encounter within last 6 months    Recent Outpatient Visits           1 week ago Localized swelling of left lower extremity   [facility] [place], Anderson Malta M, PA-C   1 month ago 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 edema, with long-term current use of insulin [facility])   [place], Anderson Malta M, PA-C   5 months ago SOB (shortness of breath)   [facility], [place], PA-C   8 months ago 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 edema, with long-term current use of insulin [facility])   [facility] [name] M, [place]   11 months ago Type 2 diabetes mellitus with diabetic peripheral angiopathy without gangrene, with long-term current use of insulin [facility])   [facility] [place], Clearnce Sorrel, [place]       Future Appointments             In 2 months Bacigalupo, Dionne Bucy, MD [facility], [facility]

## 2020-06-17 ENCOUNTER — Other Ambulatory Visit: Payer: Self-pay

## 2020-06-17 ENCOUNTER — Ambulatory Visit
Admission: RE | Admit: 2020-06-17 | Discharge: 2020-06-17 | Disposition: A | Discharge status: Home / Self Care | Payer: Medicare Other | Source: Ambulatory Visit | Attending: Orthopedic Surgery | Admitting: Orthopedic Surgery

## 2020-06-17 DIAGNOSIS — M1612 Unilateral primary osteoarthritis, left hip: Secondary | ICD-10-CM | POA: Diagnosis not present

## 2020-06-17 DIAGNOSIS — T8484XA Pain due to internal orthopedic prosthetic devices, implants and grafts, initial encounter: Secondary | ICD-10-CM | POA: Diagnosis not present

## 2020-06-17 DIAGNOSIS — M6258 Muscle wasting and atrophy, not elsewhere classified, other site: Secondary | ICD-10-CM | POA: Diagnosis not present

## 2020-06-17 DIAGNOSIS — R262 Difficulty in walking, not elsewhere classified: Secondary | ICD-10-CM | POA: Diagnosis not present

## 2020-06-26 DIAGNOSIS — J310 Chronic rhinitis: Secondary | ICD-10-CM | POA: Diagnosis not present

## 2020-06-29 ENCOUNTER — Other Ambulatory Visit: Payer: Self-pay | Admitting: Physician Assistant

## 2020-06-29 DIAGNOSIS — E559 Vitamin D deficiency, unspecified: Secondary | ICD-10-CM

## 2020-06-30 NOTE — Telephone Encounter (Signed)
Requested medication (s) are due for refill today: yes  Requested medication (s) are on the active medication list: yes  Last refill:  04/29/2020  Future visit scheduled: yes  Notes to clinic:  this refill cannot be delegated    Requested Prescriptions  Pending Prescriptions Disp Refills   Vitamin D, Ergocalciferol, (DRISDOL) 1.25 MG (50000 UNIT) CAPS capsule [Pharmacy Med Name: VITAMIN D2 1.25MG (50,000 UNIT)] 12 capsule 1    Sig: TAKE ONE CAPSULE BY MOUTH ONCE WEEKLY ON THE SAME DAY [place] (ON FRIDAYS)      Endocrinology:  Vitamins - Vitamin D Supplementation Failed - 06/29/2020 12:52 AM      Failed - 50,000 IU strengths are not delegated      Failed - Phosphate in normal range and within 360 days    No results found for: PHOS        Failed - Vitamin D in normal range and within 360 days    No results found for: ES9233AQ7, MA2633HL4, TG256LS9HTD, [place], [place], 25OHVITD3, 25OHVITD2, 25OHVITD1, 25OHVITD2, 25OHVITD3, VD25OH        Passed - Ca in normal range and within 360 days    Calcium  Date Value Ref Range Status  02/11/2020 9.4 8.7 - 10.3 mg/dL Final   Calcium, Total  Date Value Ref Range Status  01/01/2014 8.3 (L) 8.5 - 10.1 mg/dL Final          Passed - Valid encounter within last 12 months    Recent Outpatient Visits           3 weeks ago Localized swelling of left lower extremity   [place], Anderson Malta M, PA-C   1 month ago 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 edema, with long-term current use of insulin [facility])   [place], Anderson Malta M, PA-C   6 months ago SOB (shortness of breath)   [facility], [place], PA-C   8 months ago 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 edema, with long-term current use of insulin [facility])   [place],  [place], [place]   12 months ago Type 2 diabetes mellitus with diabetic peripheral angiopathy without gangrene, with long-term current use of insulin [facility])   [facility] [place], Clearnce Sorrel, [place]       Future Appointments             In 2 months Bacigalupo, Dionne Bucy, MD [facility], [facility]

## 2020-07-03 ENCOUNTER — Other Ambulatory Visit: Payer: Self-pay | Admitting: Physician Assistant

## 2020-07-03 DIAGNOSIS — R2242 Localized swelling, mass and lump, left lower limb: Secondary | ICD-10-CM

## 2020-07-03 MED ORDER — FUROSEMIDE 20 MG PO TABS: 20.0000 mg | ORAL_TABLET | Freq: Every day | ORAL | 1 refills | Status: DC

## 2020-07-03 NOTE — Telephone Encounter (Signed)
Medication Refill - Medication:   furosemide (LASIX) 20 MG tablet    Has the patient contacted their pharmacy? Yes.  Pt stated she only has 5 pills left and believes her dose needs to be increased.    Preferred Pharmacy (with phone number or street name):   [id number] [place] [place]  [address] [place] [address]  Phone:  [phone] Fax:  [phone]   Agent: Please be advised that RX refills may take up to 3 business days. We ask that you follow-up with your pharmacy.

## 2020-07-03 NOTE — Telephone Encounter (Signed)
Patient called to request medication refill on lasix 20 mg  And requested to increase dose due to continued swelling in legs. Patient reports she has noticed her legs still feel "tight" in her calves. Denies shortness of breath swelling in hands or face. Patient was wanting to know if it would be ok to increase dose. Encouraged patient to continue prescribed dose and if symptoms worsen to contact PCP. appt scheduled for 09/03/20.  Patient verbalized understanding and reports she will not take more than her prescribed amount of lasix.

## 2020-07-10 ENCOUNTER — Other Ambulatory Visit: Payer: Self-pay | Admitting: Physician Assistant

## 2020-07-10 DIAGNOSIS — N3281 Overactive bladder: Secondary | ICD-10-CM

## 2020-07-13 ENCOUNTER — Other Ambulatory Visit: Payer: Self-pay | Admitting: Physician Assistant

## 2020-07-13 DIAGNOSIS — I10 Essential (primary) hypertension: Secondary | ICD-10-CM

## 2020-07-14 ENCOUNTER — Other Ambulatory Visit: Payer: Self-pay | Admitting: Family Medicine

## 2020-07-14 DIAGNOSIS — M4316 Spondylolisthesis, lumbar region: Secondary | ICD-10-CM

## 2020-07-14 MED ORDER — HYDROCODONE-ACETAMINOPHEN 10-325 MG PO TABS: 1.0000 | ORAL_TABLET | Freq: Three times a day (TID) | ORAL | 0 refills | Status: DC | PRN

## 2020-07-14 NOTE — Telephone Encounter (Signed)
Medication Refill - Medication: HYDROcodone-acetaminophen (NORCO) 10-325 MG tablet   Has the patient contacted their pharmacy? Yes.   (Agent: If no, request that the patient contact the pharmacy for the refill.) (Agent: If yes, when and what did the pharmacy advise?)  Preferred Pharmacy (with phone number or street name):  [id number] [place] [place] - [address] [facility]  [address] [place] [address] [place] [address]  Phone: [phone] Fax: [phone]     Agent: Please be advised that RX refills may take up to 3 business days. We ask that you follow-up with your pharmacy.  

## 2020-07-14 NOTE — Telephone Encounter (Signed)
Requested medication (s) are due for refill today:   Provider to review  Requested medication (s) are on the active medication list:   Yes  Future visit scheduled:   Yes with Bacigalupo   Last ordered: 06/16/2020 #90, 0 refills  Non delegated refill    Requested Prescriptions  Pending Prescriptions Disp Refills   HYDROcodone-acetaminophen (NORCO) 10-325 MG tablet 90 tablet 0    Sig: Take 1 tablet by mouth every 8 (eight) hours as needed.      Not Delegated - Analgesics:  Opioid Agonist Combinations Failed - 07/14/2020  1:28 PM      Failed - This refill cannot be delegated      Failed - Urine Drug Screen completed in last 360 days      Passed - Valid encounter within last 6 months    Recent Outpatient Visits           1 month ago Localized swelling of left lower extremity   [facility] [place], Anderson Malta M, PA-C   2 months ago 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 edema, with long-term current use of insulin [facility])   [place], Anderson Malta M, PA-C   6 months ago SOB (shortness of breath)   [facility], [place], PA-C   9 months ago 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 edema, with long-term current use of insulin [facility])   [place], [place], [place]   1 year ago Type 2 diabetes mellitus with diabetic peripheral angiopathy without gangrene, with long-term current use of insulin [facility])   [facility] [place], Clearnce Sorrel, [place]       Future Appointments             In 1 month Bacigalupo, Dionne Bucy, MD [facility], [facility]

## 2020-07-20 ENCOUNTER — Other Ambulatory Visit: Payer: Self-pay | Admitting: Physician Assistant

## 2020-07-20 DIAGNOSIS — K219 Gastro-esophageal reflux disease without esophagitis: Secondary | ICD-10-CM

## 2020-07-21 ENCOUNTER — Other Ambulatory Visit: Payer: Self-pay | Admitting: Physician Assistant

## 2020-07-21 DIAGNOSIS — E113299 Type 2 diabetes mellitus with mild nonproliferative diabetic retinopathy without macular edema, unspecified eye: Secondary | ICD-10-CM

## 2020-07-27 ENCOUNTER — Other Ambulatory Visit: Payer: Self-pay | Admitting: Physician Assistant

## 2020-07-27 DIAGNOSIS — Z794 Long term (current) use of insulin: Secondary | ICD-10-CM

## 2020-07-27 DIAGNOSIS — E1142 Type 2 diabetes mellitus with diabetic polyneuropathy: Secondary | ICD-10-CM

## 2020-07-29 ENCOUNTER — Other Ambulatory Visit: Payer: Self-pay | Admitting: Family Medicine

## 2020-07-29 NOTE — Telephone Encounter (Signed)
Copied from [place] [phone]. Topic: Quick Communication - Rx Refill/Question >> Jul 29, 2020  5:04 PM Mcneil, Jacinto Reap wrote: Pt stated she is totally out of her medication and will not have any for tomorrow. Medication: insulin glargine (LANTUS SOLOSTAR) 100 UNIT/ML Solostar Pen  Has the patient contacted their pharmacy? yes - Pt told to call office due to no response to Rx refill request  Preferred Pharmacy (with phone number or street name): [id number] [place] [place] Phone: [phone]  Fax: [phone]  Agent: Please be advised that RX refills may take up to 3 business days. We ask that you follow-up with your pharmacy.

## 2020-07-29 NOTE — Telephone Encounter (Signed)
Requested medication (s) are due for refill today: Yes  Requested medication (s) are on the active medication list: Yes  Last refill:  Unknown  Future visit scheduled: Yes  Notes to clinic:  Unable to refill per protocol, last refill by another provider. Patient is out of medication, this is the 2nd request      Requested Prescriptions  Pending Prescriptions Disp Refills   insulin glargine (LANTUS SOLOSTAR) 100 UNIT/ML Solostar Pen 15 mL     Sig: Inject 65 Units into the skin 2 (two) times daily. Patient taking once a day      Endocrinology:  Diabetes - Insulins Passed - 07/29/2020  5:31 PM      Passed - HBA1C is between 0 and 7.9 and within 180 days    Hemoglobin A1C  Date Value Ref Range Status  05/15/2020 7.3 (A) 4.0 - 5.6 % Final  11/26/2013 7.5 (H) 4.2 - 6.3 % Final    Comment:    The American Diabetes Association recommends that a primary goal of therapy should be <7% and that physicians should reevaluate the treatment regimen in patients with HbA1c values consistently >8%.    Hgb A1c MFr Bld  Date Value Ref Range Status  02/11/2020 7.5 (H) 4.8 - 5.6 % Final    Comment:             Prediabetes: 5.7 - 6.4          Diabetes: >6.4          Glycemic control for adults with diabetes: <7.0           Passed - Valid encounter within last 6 months    Recent Outpatient Visits           1 month ago Localized swelling of left lower extremity   [facility] [place], Anderson Malta M, PA-C   2 months ago 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 edema, with long-term current use of insulin [facility])   [facility] [place], Anderson Malta M, [place]   7 months ago SOB (shortness of breath)   [facility], [place], PA-C   9 months ago 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 edema, with long-term current use of insulin  [facility])   [place], [place], [place]   1 year ago Type 2 diabetes mellitus with diabetic peripheral angiopathy without gangrene, with long-term current use of insulin [facility])   [facility] [place], Clearnce [place], [place]       Future Appointments             In 1 month Bacigalupo, Dionne Bucy, MD [facility], [facility]

## 2020-07-30 ENCOUNTER — Telehealth: Payer: Self-pay | Admitting: Family Medicine

## 2020-07-30 MED ORDER — LANTUS SOLOSTAR 100 UNIT/ML ~~LOC~~ SOPN: 65.0000 [IU] | PEN_INJECTOR | Freq: Two times a day (BID) | SUBCUTANEOUS | 3 refills | Status: DC

## 2020-07-30 NOTE — Telephone Encounter (Signed)
Pt is calling she does not use lantus solostar pen she uses the vial . [facility] [address] [place] [address] [place] phone [phone]

## 2020-07-31 ENCOUNTER — Other Ambulatory Visit: Payer: Self-pay

## 2020-07-31 DIAGNOSIS — E1142 Type 2 diabetes mellitus with diabetic polyneuropathy: Secondary | ICD-10-CM

## 2020-07-31 MED ORDER — LANTUS 100 UNIT/ML ~~LOC~~ SOLN: 75.0000 [IU] | Freq: Two times a day (BID) | SUBCUTANEOUS | 3 refills | Status: DC

## 2020-07-31 MED ORDER — ONETOUCH DELICA LANCETS 33G MISC: 5 refills | Status: DC

## 2020-07-31 NOTE — Telephone Encounter (Signed)
Please review prescription,. KW

## 2020-07-31 NOTE — Telephone Encounter (Signed)
Patient requesting a rx for needles for the pens to check her sugar level. Needles only   [id number] [place], [place] Phone:  [phone]  Fax:  [phone]

## 2020-07-31 NOTE — Telephone Encounter (Signed)
Prescription faxed

## 2020-07-31 NOTE — Telephone Encounter (Signed)
rx sent

## 2020-08-01 DIAGNOSIS — T8484XA Pain due to internal orthopedic prosthetic devices, implants and grafts, initial encounter: Secondary | ICD-10-CM | POA: Diagnosis not present

## 2020-08-01 DIAGNOSIS — M1610 Unilateral primary osteoarthritis, unspecified hip: Secondary | ICD-10-CM | POA: Diagnosis not present

## 2020-08-01 NOTE — Telephone Encounter (Signed)
[facility] Tru plus pen needles/ which would be covered by insurance / please advise asap

## 2020-08-05 ENCOUNTER — Other Ambulatory Visit: Payer: Self-pay | Admitting: Orthopedic Surgery

## 2020-08-05 MED ORDER — TRUEPLUS PEN NEEDLES 31G X 6 MM MISC: 3 refills | Status: AC

## 2020-08-05 NOTE — Addendum Note (Signed)
Addended by: Shawna Orleans on: 08/05/2020 07:42 AM   Modules accepted: Orders

## 2020-08-12 ENCOUNTER — Encounter: Payer: Self-pay | Admitting: Orthopedic Surgery

## 2020-08-12 ENCOUNTER — Other Ambulatory Visit
Admission: RE | Admit: 2020-08-12 | Discharge: 2020-08-12 | Disposition: A | Discharge status: Home / Self Care | Payer: Medicare Other | Source: Ambulatory Visit | Attending: Orthopedic Surgery | Admitting: Orthopedic Surgery

## 2020-08-12 ENCOUNTER — Telehealth: Payer: Self-pay | Admitting: *Deleted

## 2020-08-12 ENCOUNTER — Other Ambulatory Visit: Payer: Self-pay

## 2020-08-12 DIAGNOSIS — I440 Atrioventricular block, first degree: Secondary | ICD-10-CM | POA: Insufficient documentation

## 2020-08-12 DIAGNOSIS — Z01818 Encounter for other preprocedural examination: Secondary | ICD-10-CM | POA: Insufficient documentation

## 2020-08-12 DIAGNOSIS — I452 Bifascicular block: Secondary | ICD-10-CM | POA: Diagnosis not present

## 2020-08-12 DIAGNOSIS — Z01810 Encounter for preprocedural cardiovascular examination: Secondary | ICD-10-CM | POA: Diagnosis not present

## 2020-08-12 HISTORY — DX: Unspecified right bundle-branch block: I45.10

## 2020-08-12 HISTORY — DX: Unspecified osteoarthritis, unspecified site: M19.90

## 2020-08-12 HISTORY — DX: Chronic obstructive pulmonary disease, unspecified: J44.9

## 2020-08-12 HISTORY — DX: Anxiety disorder, unspecified: F41.9

## 2020-08-12 LAB — COMPREHENSIVE METABOLIC PANEL
ALT: 12 U/L (ref 0–44)
AST: 17 U/L (ref 15–41)
Albumin: 4 g/dL (ref 3.5–5.0)
Alkaline Phosphatase: 107 U/L (ref 38–126)
Anion gap: 8 (ref 5–15)
BUN: 37 mg/dL — ABNORMAL HIGH (ref 8–23)
CO2: 27 mmol/L (ref 22–32)
Calcium: 9.5 mg/dL (ref 8.9–10.3)
Chloride: 103 mmol/L (ref 98–111)
Creatinine, Ser: 1.24 mg/dL — ABNORMAL HIGH (ref 0.44–1.00)
GFR, Estimated: 46 mL/min — ABNORMAL LOW (ref >60)
Glucose, Bld: 113 mg/dL — ABNORMAL HIGH (ref 70–99)
Potassium: 4.5 mmol/L (ref 3.5–5.1)
Sodium: 138 mmol/L (ref 135–145)
Total Bilirubin: 1.1 mg/dL (ref 0.3–1.2)
Total Protein: 7.3 g/dL (ref 6.5–8.1)

## 2020-08-12 LAB — CBC WITH DIFFERENTIAL/PLATELET
Abs Immature Granulocytes: 0.01 10*3/uL (ref 0.00–0.07)
Basophils Absolute: 0 10*3/uL (ref 0.0–0.1)
Basophils Relative: 1 %
Eosinophils Absolute: 0.2 10*3/uL (ref 0.0–0.5)
Eosinophils Relative: 3 %
HCT: 37.2 % (ref 36.0–46.0)
Hemoglobin: 12.4 g/dL (ref 12.0–15.0)
Immature Granulocytes: 0 %
Lymphocytes Relative: 31 %
Lymphs Abs: 2.2 10*3/uL (ref 0.7–4.0)
MCH: 30.6 pg (ref 26.0–34.0)
MCHC: 33.3 g/dL (ref 30.0–36.0)
MCV: 91.9 fL (ref 80.0–100.0)
Monocytes Absolute: 0.4 10*3/uL (ref 0.1–1.0)
Monocytes Relative: 5 %
Neutro Abs: 4.3 10*3/uL (ref 1.7–7.7)
Neutrophils Relative %: 60 %
Platelets: 237 10*3/uL (ref 150–400)
RBC: 4.05 MIL/uL (ref 3.87–5.11)
RDW: 14.5 % (ref 11.5–15.5)
WBC: 7.1 10*3/uL (ref 4.0–10.5)
nRBC: 0 % (ref 0.0–0.2)

## 2020-08-12 LAB — URINALYSIS, ROUTINE W REFLEX MICROSCOPIC
Bilirubin Urine: NEGATIVE
Glucose, UA: NEGATIVE mg/dL
Hgb urine dipstick: NEGATIVE
Ketones, ur: NEGATIVE mg/dL
Nitrite: NEGATIVE
Protein, ur: NEGATIVE mg/dL
Specific Gravity, Urine: 1.006 (ref 1.005–1.030)
Squamous Epithelial / HPF: NONE SEEN (ref 0–5)
pH: 6 (ref 5.0–8.0)

## 2020-08-12 LAB — SURGICAL PCR SCREEN
MRSA, PCR: NEGATIVE
Staphylococcus aureus: NEGATIVE

## 2020-08-12 NOTE — Telephone Encounter (Signed)
Dr. Rockey Situ   You previously saw this patient 10/2019 in follow up and cleared her for total hip arthroplasty.It appears that she was admitted to [facility] shortly after that and was treated for meningitis with plans to transfer to [facility] for ENT evaluation. She has not been seen by our team since that time. We have once again been contacted to give cardiac clearance. Given the duration of time since last evaluated and circumstances in between, I have recommended that she be seen before we can give adequate and safe clearance.   Please send your recommendations to the preop pool and we will also be working on a follow up for her  Thank you

## 2020-08-12 NOTE — Telephone Encounter (Signed)
LVM for patient to schedule.

## 2020-08-12 NOTE — Telephone Encounter (Signed)
  1. What type of surgery is being performed?  TOTAL HIP ARTHROPLASTY ANTERIOR APPROACH   2. When is this surgery scheduled?  08/19/2020    3. Are there any medications that need to be held prior to surgery?  N/A   4. Practice name and name of physician performing surgery?  Performing surgeon: Dr. Hessie Knows, MD  Requesting clearance: Honor Loh, FNP-C     5. Anesthesia type (none, local, MAC, general)? General   6. What is the office phone and fax number?   Phone: [phone]  Fax: [phone]

## 2020-08-12 NOTE — Telephone Encounter (Signed)
-----   Message from Karen Kitchens, NP sent at 08/12/2020  1:01 PM EDT ----- Regarding: Request for pre-operative cardiac clearance Request for pre-operative cardiac clearance:    1. What type of surgery is being performed?  TOTAL HIP ARTHROPLASTY ANTERIOR APPROACH   2. When is this surgery scheduled?  08/19/2020    3. Are there any medications that need to be held prior to surgery?  N/A   4. Practice name and name of physician performing surgery?  Performing surgeon: Dr. Hessie Knows, MD  Requesting clearance: Honor Loh, FNP-C     5. Anesthesia type (none, local, MAC, general)? General   6. What is the office phone and fax number?   Phone: [phone]  Fax: [phone]   ATTENTION: Unable to create telephone message as per your standard workflow. Directed by [facility] providers to send requests for cardiac clearance to this pool for appropriate distribution to provider covering pre-operative clearances.   Honor Loh, MSN, APRN, FNP-C, CEN  [facility]  Peri-operative Services Nurse Practitioner  Phone: [phone]  08/12/20 12:58 PM

## 2020-08-12 NOTE — Telephone Encounter (Signed)
Will send to the [facility] office schedulers to make pt an appt for surgical clearance.

## 2020-08-12 NOTE — Patient Instructions (Addendum)
Your procedure is scheduled on:08-19-20 TUESDAY Report to the Registration Desk on the 1st floor of the [facility]-Then proceed to the 2nd floor Surgery Desk in the [facility] To find out your arrival time, please call [phone] between 1PM - 3PM on:08-18-20 MONDAY  REMEMBER: Instructions that are not followed completely may result in serious medical risk, up to and including death; or upon the discretion of your surgeon and anesthesiologist your surgery may need to be rescheduled.  Do not eat food after midnight the night before surgery.  No gum chewing, lozengers or hard candies.  You may however, drink WATER up to 2 hours before you are scheduled to arrive for your surgery. Do not drink anything within 2 hours of your scheduled arrival time.  Type 1 and Type 2 diabetics should only drink water.  TAKE THESE MEDICATIONS THE MORNING OF SURGERY WITH A SIP OF WATER: -Metoprolol (Toprol XL) -Oxybutynin (Ditropan) --Prilosec (Omeprazole)-take one the night before and one on the morning of surgery - helps to prevent nausea after surgery.)  Take 1/2 of usual insulin dose the night before surgery and none on the morning of surgery-Take half of your Lantus Monday night (08-18-20)  One week prior to surgery: Stop Anti-inflammatories (NSAIDS) such as Advil, Aleve, Ibuprofen, Motrin, Naproxen, Naprosyn and Aspirin based products such as Excedrin, Goodys Powder, BC Powder.You may however, continue to take Tylenol if needed for pain up until the day of surgery.  Stop ANY OVER THE COUNTER supplements/vitmains NOW (08-12-20) until after surgery-However, continue your Klor-Con (Potassium) up until the day prior to your surgery   No Alcohol for 24 hours before or after surgery.  No Smoking including e-cigarettes for 24 hours prior to surgery.  No chewable tobacco products for at least 6 hours prior to surgery.  No nicotine patches on the day of surgery.  Do not use any "recreational" drugs for at  least a week prior to your surgery.  Please be advised that the combination of cocaine and anesthesia may have negative outcomes, up to and including death. If you test positive for cocaine, your surgery will be cancelled.  On the morning of surgery brush your teeth with toothpaste and water, you may rinse your mouth with mouthwash if you wish. Do not swallow any toothpaste or mouthwash.  Do not wear jewelry, make-up, hairpins, clips or nail polish.  Do not wear lotions, powders, or perfumes.   Do not shave body from the neck down 48 hours prior to surgery just in case you cut yourself which could leave a site for infection.  Also, freshly shaved skin may become irritated if using the CHG soap.  Contact lenses, hearing aids and dentures may not be worn into surgery.  Do not bring valuables to the hospital. [facility] is not responsible for any missing/lost belongings or valuables.   Use CHG Soap as directed on instruction sheet.  Bring your C-PAP to the hospital with you in case you may have to spend the night.   Notify your doctor if there is any change in your medical condition (cold, fever, infection).  Wear comfortable clothing (specific to your surgery type) to the hospital.  Plan for stool softeners for home use; pain medications have a tendency to cause constipation. You can also help prevent constipation by eating foods high in fiber such as fruits and vegetables and drinking plenty of fluids as your diet allows.  After surgery, you can help prevent lung complications by doing breathing exercises.  Take deep breaths and cough every 1-2 hours. Your doctor may order a device called an Incentive Spirometer to help you take deep breaths. When coughing or sneezing, hold a pillow firmly against your incision with both hands. This is called "splinting." Doing this helps protect your incision. It also decreases belly discomfort.  If you are being admitted to the hospital overnight,  leave your suitcase in the car. After surgery it may be brought to your room.  If you are being discharged the day of surgery, you will not be allowed to drive home. You will need a responsible adult (18 years or older) to drive you home and stay with you that night.   If you are taking public transportation, you will need to have a responsible adult (18 years or older) with you. Please confirm with your physician that it is acceptable to use public transportation.   Please call the [facility] at [phone] if you have any questions about these instructions.  Surgery Visitation Policy:  Patients undergoing a surgery or procedure may have one family member or support person with them as long as that person is not COVID-19 positive or experiencing its symptoms.  That person may remain in the waiting area during the procedure.  Inpatient Visitation:    Visiting hours are 7 a.m. to 8 p.m. Inpatients will be allowed two visitors daily. The visitors may change each day during the patient's stay. No visitors under the age of 38. Any visitor under the age of 13 must be accompanied by an adult. The visitor must pass COVID-19 screenings, use hand sanitizer when entering and exiting the patient's room and wear a mask at all times, including in the patient's room. Patients must also wear a mask when staff or their visitor are in the room. Masking is required regardless of vaccination status.  Total Hip/Knee Replacement Preoperative Educational Video  To better prepare for surgery, please view our videos that explain the physical activity and discharge planning required to have the best surgical recovery at [facility].  [email or web address]      Questions? Call [phone] or email [email or web address]

## 2020-08-13 ENCOUNTER — Encounter: Payer: Self-pay | Admitting: Orthopedic Surgery

## 2020-08-13 NOTE — Progress Notes (Signed)
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 Clinical nursing staff has updated patient's PMH/PSHx, current medication list, and drug allergies/intolerances to ensure comprehensive history available to assist in medical decision making as it pertains to the aforementioned surgical procedure and anticipated anesthetic course. Extensive review of available clinical information performed. [place] PMH and PSHx updated with any diagnoses/procedures that  may have been inadvertently omitted during her intake with the pre-admission testing department's nursing staff.  Clinical Discussion:  Andrea [name] is a 75 y.o. female who is submitted for pre-surgical anesthesia review and clearance prior to her undergoing the above procedure. Patient is a Former Smoker (30 pack years; quit 07/2012). Pertinent PMH includes: CAD, MI, HFrEF, G1DD, RBBB, 1st degree AV block, PAD, TIA, HTN, HLD, T2DM, GERD (on daily PPI), anemia, OA, spinal stenosis, depression, anxiety.   Patient is followed by cardiology Rockey Situ, MD). She was last seen in the cardiology clinic on 08/18/2020; notes reviewed.  At the time of her clinic visit patient reported to be doing well from a cardiovascular perspective. She denied chest pain, PND, orthopnea, palpitations, vertiginous symptoms, or presyncope/syncope.  Patient with chronic exertional  dyspnea.  Patient with erythema and pain in her LEFT lower extremity.  Of note, patient has been seen by vascular surgery in the past for the same and has undergone unrevealing ABI/TBI testing; symptoms stable since testing per patient report. Patient with a past medical history significant for cardiovascular diagnoses.   Patient suffered a MI in 75. Patient underwent PCI, however she advises that no stent was placed. No records available for review regarding catheterization; information obtained from current cardiologist.  Myocardial perfusion imaging study performed on 05/12/2016 demonstrated no significant ischemia, however there was a large perfusion defect consistent with patient's previous MI.  Wall motion abnormalities noted.  LVEF was normal at 56%.  There were no EKG changes concerning for ischemia at peak stress during recovery.  TTE on 11/18/2019 revealed a mildly decreased left ventricular systolic function with an EF of 40-45%.  Anterior regional wall hypokinesis noted.  Doppler parameters consistent with G1DD (see full interpretation of cardiovascular testing below).  Patient on GDMT for her HTN and HLD diagnoses.  Blood pressure well controlled at 130/64 on currently prescribed diuretic, ARB, and beta-blocker therapies.  Patient is on a statin for her HLD. T2DM reasonably controlled on currently prescribed regimen; Hgb A1c 7.3% when last checked on 05/15/2020.  Activity limited by orthopedic pain and overall deconditioning. DASI defined functional capacity poor with a documented ability to complete 1-4 METS; consideration lent to current orthopedic pain.  Given the length of time since patient's noninvasive studies, the decision was made to repeat TTE.  No changes were made to patient's medication regimen during the visit, however consideration of escalation of her current therapy and addition of a SGLT2 was discussed.  Patient  to follow-up with outpatient cardiology after TTE or sooner if  needed.  Patient is scheduled for elective orthopedic hardware removal and hip arthroplasty on hip arthroplasty 08/19/2020 with Dr. Hessie Knows.  Given patient's past medical history significant for cardiovascular diagnoses, presurgical cardiac clearance was sought by the PAT team.  Per cardiology, "RCRI class IV, which places patient at an 11% risk of MACE.  It is reasonable to proceed with total hip arthroplasty without additional testing or intervention.  Echocardiogram ordered today for reassessment of EF can be deferred until after surgery". This patient is not on any type of daily anticoagulation/antiplatelet therapies.  Patient denies previous perioperative complications with anesthesia in the past. In review of the available records, it is noted that patient underwent a general anesthetic course here (ASA III) in 02/2019 without documented complications.   Vitals with BMI 08/18/2020 08/12/2020 06/05/2020  Height 4\' 9"  4\' 10"  -  Weight 149 lbs 146 lbs 13 oz 145 lbs  BMI 85.46 27.0 -  Systolic 350 093 818  Diastolic 64 51 78  Pulse 87 79 108    Providers/Specialists:   NOTE: Primary physician provider listed below. Patient may have been seen by APP or partner within same practice.   PROVIDER ROLE / SPECIALTY LAST Fabio Bering, MD  Orthopedics (Surgeon)  08/01/2020  Virginia Crews, MD  Primary Care Provider  06/05/2020  Ida Rogue, MD  Cardiology  08/18/2020   Allergies:  Metformin and related and Sulfa antibiotics  Current Home Medications:   No current facility-administered medications for this encounter.    atorvastatin (LIPITOR) 80 MG tablet   Calcium Carbonate-Vitamin D 600-400 MG-UNIT tablet   Cyanocobalamin (B-12) 1000 MCG CAPS   furosemide (LASIX) 20 MG tablet   HYDROcodone-acetaminophen (NORCO) 10-325 MG tablet   Hypertonic Nasal Wash (SINUS RINSE NA)   losartan (COZAAR) 25 MG tablet   metoprolol succinate (TOPROL-XL) 25 MG 24 hr tablet   Multiple  Vitamins-Minerals (PRESERVISION AREDS 2 PO)   Omega-3 1000 MG CAPS   omeprazole (PRILOSEC) 40 MG capsule   oxybutynin (DITROPAN-XL) 10 MG 24 hr tablet   potassium chloride (KLOR-CON) 10 MEQ tablet   TRULICITY 1.5 EX/9.3ZJ SOPN   Vitamin D, Ergocalciferol, (DRISDOL) 1.25 MG (50000 UNIT) CAPS capsule   Alcohol Swabs (ALCOHOL PREP) PADS   Barberry-Oreg Grape-Goldenseal (BERBERINE COMPLEX PO)   insulin glargine (LANTUS) 100 UNIT/ML injection   Insulin Pen Needle (TRUEPLUS PEN NEEDLES) 31G X 6 MM MISC   INSULIN SYRINGE 1CC/29G 29G X 1/2" 1 ML MISC   nitrofurantoin, macrocrystal-monohydrate, (MACROBID) 100 MG capsule   OneTouch Delica Lancets 69C MISC   ONETOUCH VERIO test strip   History:   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   Depression    Diabetes mellitus type 2, uncomplicated (HCC)    Diabetic retinopathy ([place])    Dupuytren contracture 2011   s/p surgery to LEFT hand   Frequent urinary tract infections    GERD (gastroesophageal reflux disease)    Grade I diastolic dysfunction    HTN (hypertension)    Hyperlipidemia, unspecified    Irritable bowel syndrome    Meningitis 11/2019   MI (myocardial infarction) ([place]) 1994  no stent   Neuromuscular disorder (HCC)    nerve pain in back and legs   Osteoporosis    Pneumonia 2021   RBBB    Sepsis ([place]) 11/2019   Skin cancer    TIA (transient ischemic attack) 2014   Vitamin D deficiency, unspecified    Wheezing    Past Surgical History:  Procedure Laterality Date   BACK SURGERY     lower due to spinal stenosis   CARDIAC CATHETERIZATION  1994   CATARACT EXTRACTION, BILATERAL Bilateral    COLONOSCOPY     x3   COLONOSCOPY WITH PROPOFOL N/A 02/08/2019   Procedure: COLONOSCOPY  WITH PROPOFOL;  Surgeon: [name], MD;  Location: [facility] ENDOSCOPY;  Service: Gastroenterology;  Laterality: N/A;   CORONARY ANGIOPLASTY  1994   DUPUYTREN CONTRACTURE RELEASE Left 2011   HIP FRACTURE SURGERY  11/2013   PR COLSC FLX W/REMOVAL LESION BY HOT BX FORCEPS   08/21/2015   Procedure: COLONOSCOPY, FLEXIBLE, PROXIMAL TO SPLENIC FLEXURE; W/REMOVAL TUMOR/POLYP/OTHER LESION, HOT BX FORCEP/CAUTE; Surgeon: Carlena Hurl, MD; Location: OR [place]; Service: General Surgery   REPAIR DURAL / CSF LEAK     to left sinus   TEE WITHOUT CARDIOVERSION N/A 11/20/2019   Procedure: TRANSESOPHAGEAL ECHOCARDIOGRAM (TEE);  Surgeon: Nelva Bush, MD;  Location: [facility] ORS;  Service: Cardiovascular;  Laterality: N/A;   Family History  Problem Relation Age of Onset   Depression Daughter    Arthritis Daughter        spine   Plantar fasciitis Daughter    Anxiety disorder Daughter    Hyperlipidemia Daughter    AAA (abdominal aortic aneurysm) Daughter    Hypertension Mother    Stroke Mother    Cataracts Mother    Depression Mother    Heart disease Mother    Asthma Brother    Diabetes Daughter    Hyperlipidemia Daughter    Hypertension Daughter    Breast cancer Cousin        maternal   Social History   Tobacco Use   Smoking status: Former    Packs/day: 1.00    Years: 30.00    Pack years: 30.00    Types: Cigarettes    Quit date: 07/17/2012    Years since quitting: 8.0   Smokeless tobacco: Never   Tobacco comments:       Vaping Use   Vaping Use: Never used  Substance Use Topics   Alcohol use: Not Currently    Alcohol/week: 0.0 standard drinks   Drug use: No    Pertinent Clinical Results:  LABS: Labs reviewed: Acceptable for surgery.  Preadmission on 08/19/2020  Component Date Value Ref Range Status   Specimen Description 08/12/2020    Final                   Value:URINE, RANDOM Performed at [facility], [address], [place], [place] [address]    Special  Requests 08/12/2020    Final                   Value:NONE Performed at [facility], [address] [place], [place] [address]    Culture 08/12/2020 >=100,000 COLONIES/mL ESCHERICHIA COLI (A)  Final   Report Status 08/12/2020 08/15/2020 FINAL   Final   Organism ID, Bacteria 08/12/2020 ESCHERICHIA COLI (A)  Final  Hospital Outpatient Visit on 08/15/2020  Component Date Value Ref Range Status   SARS Coronavirus 2 08/15/2020 NEGATIVE  NEGATIVE Final   Comment: (NOTE)  SARS-CoV-2 target nucleic acids are NOT DETECTED.  The SARS-CoV-2 RNA is generally detectable in upper and lower respiratory specimens during the acute phase of infection. Negative results do not preclude SARS-CoV-2 infection, do not rule out co-infections with other pathogens, and should not be used as the sole basis for treatment or other patient management decisions. Negative results must be combined with clinical observations, patient history, and epidemiological information. The expected result is Negative.  Fact Sheet for Patients: SugarRoll.be  Fact Sheet for Healthcare Providers: https://www.woods-mathews.com/  This test is not yet approved or cleared by the [place] FDA and  has been authorized for detection and/or diagnosis of SARS-CoV-2 by FDA under an Emergency Use Authorization (EUA). This EUA will remain  in effect (meaning this test can be used) for the duration of the COVID-19 declaration under Se                          ction 564(b)(1) of the Act, 21 U.S.C. section 360bbb-3(b)(1), unless the authorization is terminated or revoked sooner.  Performed at [facility], [place] [address], [place], [place] [address]   Hospital Outpatient Visit on 08/12/2020  Component Date Value Ref Range Status   WBC 08/12/2020 7.1  4.0 - 10.5 K/uL Final   RBC 08/12/2020 4.05  3.87 - 5.11 MIL/uL Final   Hemoglobin 08/12/2020 12.4  12.0 - 15.0 g/dL Final    HCT 08/12/2020 37.2  36.0 - 46.0 % Final   MCV 08/12/2020 91.9  80.0 - 100.0 fL Final   MCH 08/12/2020 30.6  26.0 - 34.0 pg Final   MCHC 08/12/2020 33.3  30.0 - 36.0 g/dL Final   RDW 08/12/2020 14.5  11.5 - 15.5 % Final   Platelets 08/12/2020 237  150 - 400 K/uL Final   nRBC 08/12/2020 0.0  0.0 - 0.2 % Final   Neutrophils Relative % 08/12/2020 60  % Final   Neutro Abs 08/12/2020 4.3  1.7 - 7.7 K/uL Final   Lymphocytes Relative 08/12/2020 31  % Final   Lymphs Abs 08/12/2020 2.2  0.7 - 4.0 K/uL Final   Monocytes Relative 08/12/2020 5  % Final   Monocytes Absolute 08/12/2020 0.4  0.1 - 1.0 K/uL Final   Eosinophils Relative 08/12/2020 3  % Final   Eosinophils Absolute 08/12/2020 0.2  0.0 - 0.5 K/uL Final   Basophils Relative 08/12/2020 1  % Final   Basophils Absolute 08/12/2020 0.0  0.0 - 0.1 K/uL Final   Immature Granulocytes 08/12/2020 0  % Final   Abs Immature Granulocytes 08/12/2020 0.01  0.00 - 0.07 K/uL Final   Performed at [facility], [place], [place] [address]   Sodium 08/12/2020 138  135 - 145 mmol/L Final   Potassium 08/12/2020 4.5  3.5 - 5.1 mmol/L Final   Chloride 08/12/2020 103  98 - 111 mmol/L Final   CO2 08/12/2020 27  22 - 32 mmol/L Final   Glucose, Bld 08/12/2020 113 (A) 70 - 99 mg/dL Final   Glucose reference range applies only to samples taken after fasting for at least 8 hours.   BUN 08/12/2020 37 (A) 8 - 23 mg/dL Final   Creatinine, Ser 08/12/2020 1.24 (A) 0.44 - 1.00 mg/dL Final   Calcium 08/12/2020 9.5  8.9 - 10.3 mg/dL Final   Total Protein 08/12/2020 7.3  6.5 - 8.1 g/dL Final   Albumin 08/12/2020 4.0  3.5 - 5.0 g/dL Final   AST 08/12/2020  17  15 - 41 U/L Final   ALT 08/12/2020 12  0 - 44 U/L Final   Alkaline Phosphatase 08/12/2020 107  38 - 126 U/L Final   Total Bilirubin 08/12/2020 1.1  0.3 - 1.2 mg/dL Final   GFR, Estimated 08/12/2020 46 (A) >60 mL/min Final   Comment: (NOTE) Calculated using the CKD-EPI Creatinine Equation  (2021)    Anion gap 08/12/2020 8  5 - 15 Final   Performed at [facility], [address] [place], [place] [address]   Color, Urine 08/12/2020 STRAW (A) YELLOW Final   APPearance 08/12/2020 CLEAR (A) CLEAR Final   Specific Gravity, Urine 08/12/2020 1.006  1.005 - 1.030 Final   pH 08/12/2020 6.0  5.0 - 8.0 Final   Glucose, UA 08/12/2020 NEGATIVE  NEGATIVE mg/dL Final   Hgb urine dipstick 08/12/2020 NEGATIVE  NEGATIVE Final   Bilirubin Urine 08/12/2020 NEGATIVE  NEGATIVE Final   Ketones, ur 08/12/2020 NEGATIVE  NEGATIVE mg/dL Final   Protein, ur 08/12/2020 NEGATIVE  NEGATIVE mg/dL Final   Nitrite 08/12/2020 NEGATIVE  NEGATIVE Final   Leukocytes,Ua 08/12/2020 TRACE (A) NEGATIVE Final   RBC / HPF 08/12/2020 0-5  0 - 5 RBC/hpf Final   WBC, UA 08/12/2020 6-10  0 - 5 WBC/hpf Final   Bacteria, UA 08/12/2020 MANY (A) NONE SEEN Final   Squamous Epithelial / LPF 08/12/2020 NONE SEEN  0 - 5 Final   Performed at [facility], [address] [place], [place] [address]   ABO/RH(D) 08/12/2020 A POS   Final   Antibody Screen 08/12/2020 NEG   Final   Sample Expiration 08/12/2020 08/26/2020,2359   Final   Extend sample reason 08/12/2020    Final                   Value:NO TRANSFUSIONS OR PREGNANCY IN THE PAST 3 MONTHS Performed at [facility], [address] [place], [place] [address]    MRSA, PCR 08/12/2020 NEGATIVE  NEGATIVE Final   Staphylococcus aureus 08/12/2020 NEGATIVE  NEGATIVE Final   Comment: (NOTE) The Xpert SA Assay (FDA approved for NASAL specimens in patients 1 years of age and older), is one component of a comprehensive surveillance program. It is not intended to diagnose infection nor to guide or monitor treatment. Performed at [facility], [address] [place], [place] [address]       ECG: Date: 08/12/2020 Time ECG obtained: 1224 PM Rate: 86 bpm Rhythm:  Minus rhythm with a first-degree AV block; RBBB; LAFB Axis (leads I  and aVF): Right axis deviation Intervals: PR 226 ms. QRS 148 ms. QTc 490 ms. ST segment and T wave changes: No evidence of acute ST segment elevation or depression Comparison: Similar to previous tracing obtained on 12/26/2019   IMAGING / PROCEDURES: CT HIP LEFT WITHOUT CONTRAST performed on 06/17/2020 Solidly healed left intertrochanteric fracture with fixation hardware in place. No hardware complication is identified. Mild left hip osteoarthritis. Atrophy of the left gluteus maximus is incompletely visualized but may be secondary to disuse.  VASCULAR ABI WITH/WITHOUT TBI performed on 04/09/2020 Right:  Resting right ankle-brachial index is within normal range.  No evidence of significant right lower extremity arterial disease.  The right toe-brachial index is normal. Left:  Resting left ankle-brachial index is within normal range.  No evidence of significant left lower extremity arterial disease.  The left toe-brachial index is normal.  TRANSTHORACIC ECHOCARDIOGRAM performed on 11/18/2019 Challenging images  Left ventricular ejection fraction, by grossly  estimated at 40 to 45% though difficult to visualize.  The left ventricle has mildly decreased function.  The left ventricle demonstrates regional wall motion abnormalities (images concerning for anterior wall hypokinesis).  Left ventricular diastolic parameters are consistent with Grade I diastolic dysfunction (impaired relaxation).  Right ventricular systolic function is normal.  The right ventricular size is normal.  Valves not well visualized.   LEXISCAN performed on 05/12/2016 Pharmacological myocardial perfusion imaging study with no significant ischemia Large region of severe fixed perfusion defect in the mid anterior wall extending to apical region, anteroseptal wall, apical, apical inferior region, consistent with previous MI Large region of Wall motion abnormality noted in the region above EF estimated at 56% No EKG  changes concerning for ischemia at peak stress or in recovery. Baseline EKG concerning for old anterior MI At least moderate risk scan given size of perfusion defect   Impression and Plan:  Andrea [name] has been referred for pre-anesthesia review and clearance prior to her undergoing the planned anesthetic and procedural courses. Available labs, pertinent testing, and imaging results were personally reviewed by me. This patient has been appropriately cleared by cardiology with an overall ACCEPTABLE/REASONABLE risk of significant perioperative cardiovascular complications.  Patient with lab confirmed UTI. Culture grew out Eschericia coli. Patient was started on culture appropriate therapy (nitrofurantoin x 5 days) on 08/14/2020 by PAT APP; surgeon aware of Tx POC for this patient.   Based on clinical review performed today (08/18/20), barring any significant acute changes in the patient's overall condition, it is anticipated that she will be able to proceed with the planned surgical intervention. Any acute changes in clinical condition may necessitate her procedure being postponed and/or cancelled. Patient will meet with anesthesia team (MD and/or CRNA) on the day of her procedure for preoperative evaluation/assessment. Questions regarding anesthetic course will be fielded at that time.   Pre-surgical instructions were reviewed with the patient during her PAT appointment and questions were fielded by PAT clinical staff. Patient was advised that if any questions or concerns arise prior to her procedure then she should return a call to PAT and/or her surgeon's office to discuss.  Honor Loh, MSN, APRN, FNP-C, CEN [facility]  Peri-operative Services Nurse Practitioner Phone: [phone] 08/18/20 11:51 AM  NOTE: This note has been prepared using Dragon dictation software. Despite my best ability to proofread, there is always the potential that unintentional transcriptional  errors may still occur from this process.

## 2020-08-13 NOTE — Telephone Encounter (Signed)
Pt has appt 08/18/20 with Marrianne Mood, PAC for pre op clearance. I will send notes to [facility] for upcoming appt, will send FYI to requesting office pt has appt.

## 2020-08-14 ENCOUNTER — Other Ambulatory Visit: Payer: Self-pay | Admitting: Orthopedic Surgery

## 2020-08-14 ENCOUNTER — Telehealth: Payer: Self-pay | Admitting: Urgent Care

## 2020-08-14 DIAGNOSIS — Z01818 Encounter for other preprocedural examination: Secondary | ICD-10-CM

## 2020-08-14 DIAGNOSIS — N390 Urinary tract infection, site not specified: Secondary | ICD-10-CM

## 2020-08-14 MED ORDER — NITROFURANTOIN MONOHYD MACRO 100 MG PO CAPS: 100.0000 mg | ORAL_CAPSULE | Freq: Two times a day (BID) | ORAL | 0 refills | Status: DC

## 2020-08-14 NOTE — Progress Notes (Unsigned)
itrofu

## 2020-08-14 NOTE — Progress Notes (Signed)
  [facility] Perioperative Services: Pre-Admission/Anesthesia Testing  Abnormal Lab Notification and Treatment Plan of Care   Date: 08/14/20  Name: Andrea [name] MRN:   [id number]  Re: Abnormal labs noted during PAT appointment   Provider(s) Notified: Hessie Knows, MD Notification mode: Routed and/or faxed via [facility]   ABNORMAL LAB VALUE(S): Lab Results  Component Value Date   COLORURINE STRAW (A) 08/12/2020   APPEARANCEUR CLEAR (A) 08/12/2020   LABSPEC 1.006 08/12/2020   PHURINE 6.0 08/12/2020   GLUCOSEU NEGATIVE 08/12/2020   HGBUR NEGATIVE 08/12/2020   BILIRUBINUR NEGATIVE 08/12/2020   KETONESUR NEGATIVE 08/12/2020   PROTEINUR NEGATIVE 08/12/2020   UROBILINOGEN 0.2 12/16/2016   NITRITE NEGATIVE 08/12/2020   LEUKOCYTESUR TRACE (A) 08/12/2020   EPIU NONE SEEN 08/12/2020   WBCU 6-10 08/12/2020   RBCU 0-5 08/12/2020   BACTERIA MANY (A) 08/12/2020   Lab Results  Component Value Date   CULT >=100,000 COLONIES/mL GRAM NEGATIVE RODS (A) 08/12/2020    Notes:   Patient scheduled for a LEFT total hip arthroplasty on 08/19/2020.    UA performed in PAT consistent with/concerning for infection.  No leukocytosis noted on CBC; 7100 K/uL Renal function normal. Estimated Creatinine Clearance: 32.2 mL/min (A) (by C-G formula based on SCr of 1.24 mg/dL (H)). Urine C&S added to assess for pathogenically significant growth. Culture grew out > 100 CFU/mL Gm (-) rods (likely Escherichia coli)  Impression and Plan:  UA was (+) for infection; reflex culture sent. Contacted patient to discuss. Patient reporting that she is experiencing urinary frequency, urgency, and subtle dysuria. She denies abdominal and back pain. No fevers, nausea, or vomiting. Patient with surgery scheduled early next week. In efforts to avoid delaying patient's procedure, I would like to proceed with empiric treatment for her urinary tract infection without waiting on the final pathogen ID and  sensitivities.   Patient is allergic to sulfa drugs, thus will treat with a 5 day course of nitrofurantoin. Patient encouraged to complete the entire course of antibiotics even if she begins to feel better. She was advised that if culture demonstrates resistance to the prescribed antibiotic, she will be contacted and advised of the need to change the antibiotic being used to treat her infection.   Meds ordered this encounter  Medications   nitrofurantoin, macrocrystal-monohydrate, (MACROBID) 100 MG capsule    Sig: Take 1 capsule (100 mg total) by mouth 2 (two) times daily for 5 days.    Dispense:  10 capsule    Refill:  0   Patient encouraged to increase her fluid intake as much as possible. Discussed that water is always best to flush the urinary tract. She was advised to avoid caffeine containing fluids until her infections clears, as caffeine can cause her to experience painful bladder spasms.   May use Tylenol as needed for pain/fever.  Results and treatment plan of care forwarded to primary attending surgeon to make them aware.   This is a Community education officer; no formal response is required.  Honor Loh, MSN, APRN, FNP-C, CEN [facility]  Peri-operative Services Nurse Practitioner Phone: [phone] Fax: [phone] 08/14/20 8:56 AM

## 2020-08-15 ENCOUNTER — Other Ambulatory Visit
Admission: RE | Admit: 2020-08-15 | Discharge: 2020-08-15 | Disposition: A | Discharge status: Home / Self Care | Payer: Medicare Other | Source: Ambulatory Visit | Attending: Orthopedic Surgery | Admitting: Orthopedic Surgery

## 2020-08-15 ENCOUNTER — Other Ambulatory Visit: Payer: Self-pay

## 2020-08-15 DIAGNOSIS — Z01812 Encounter for preprocedural laboratory examination: Secondary | ICD-10-CM | POA: Insufficient documentation

## 2020-08-15 DIAGNOSIS — Z20822 Contact with and (suspected) exposure to covid-19: Secondary | ICD-10-CM | POA: Insufficient documentation

## 2020-08-15 LAB — URINE CULTURE: Culture: >=100000 — AB

## 2020-08-15 LAB — SARS CORONAVIRUS 2 (TAT 6-24 HRS): SARS Coronavirus 2: NEGATIVE

## 2020-08-17 NOTE — Progress Notes (Signed)
Office Visit    Patient Name: Andrea [name] Date of Encounter: 08/18/2020  PCP:  Virginia Crews, MD   [place]  Cardiologist:  Ida Rogue, MD  Advanced Practice Provider:  No care team member to display Electrophysiologist:  [id number]}   Chief Complaint    Chief Complaint  Patient presents with   Surgical clearnace for L THR     75 y.o. female with history of HTN, prior CVA/TIA, prior tobacco use, RBBB, CAD s/p MI in 6073, systolic heart failure, HLD DM2, PAD, and here today for preoperative evaluation.   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   Depression    Diabetes mellitus type 2, uncomplicated ([place])    Diabetic retinopathy ([place])    Dupuytren contracture 2011   s/p surgery to LEFT hand   Frequent urinary tract infections    GERD (gastroesophageal reflux disease)    Grade I diastolic dysfunction    HTN (hypertension)    Hyperlipidemia, unspecified    Irritable bowel syndrome    Meningitis 11/2019   MI (myocardial infarction) ([place]) 1994   no stent   Neuromuscular disorder (HCC)    nerve pain in back and legs   Osteoporosis    Pneumonia 2021   RBBB    Sepsis ([place]) 11/2019   Skin cancer    TIA (transient ischemic attack) 2014   Vitamin D deficiency, unspecified    Wheezing    Past Surgical History:  Procedure Laterality Date   BACK SURGERY     lower due to spinal stenosis   CARDIAC CATHETERIZATION  1994   CATARACT EXTRACTION, BILATERAL Bilateral    COLONOSCOPY     x3   COLONOSCOPY WITH PROPOFOL N/A 02/08/2019   Procedure: COLONOSCOPY WITH PROPOFOL;  Surgeon: [name], MD;  Location: [facility] ENDOSCOPY;  Service: Gastroenterology;   Laterality: N/A;   CORONARY ANGIOPLASTY  1994   DUPUYTREN CONTRACTURE RELEASE Left 2011   HIP FRACTURE SURGERY  11/2013   PR COLSC FLX W/REMOVAL LESION BY HOT BX FORCEPS   08/21/2015   Procedure: COLONOSCOPY, FLEXIBLE, PROXIMAL TO SPLENIC FLEXURE; W/REMOVAL TUMOR/POLYP/OTHER LESION, HOT BX FORCEP/CAUTE; Surgeon: Carlena Hurl, MD; Location: OR [place]; Service: General Surgery   REPAIR DURAL / CSF LEAK     to left sinus   TEE WITHOUT CARDIOVERSION N/A 11/20/2019   Procedure: TRANSESOPHAGEAL ECHOCARDIOGRAM (TEE);  Surgeon: Nelva Bush, MD;  Location: [facility] ORS;  Service: Cardiovascular;  Laterality: N/A;    Allergies  Allergies  Allergen Reactions   Metformin And Related Diarrhea   Sulfa Antibiotics Rash    History of Present Illness    Andrea [name] is a 75 y.o. female with PMH as above.   She follows with vascular surgery for annual imaging of her PAD.  Prior EF 45-50% 2014. Echo 05/2017 with EF 55-60%. Stress test 05/2016 with fixed defect mid to distal anterior wall consistent with previous MI. 11/2019 echo with EF 40-45% though difficult to visualize, the LV had mildly decreased function. The LV also demonstrated RWMA with images concerning for anterior wall hypokinesis. G1DD.   She was seen in office 04/26/19  by Laurann Montana, NP. She was wheelchair bound since her hip surgery and using a walker to get around the house. Diet was discussed, as well as monitoring her BP. She was seen again 10/30/19 for preoperative clearance before left hip surgery and was recommended to proceed to surgery without further workup.   In September of 2021, she was admitted to [facility] with CSF leak, pneumonia, and meningitis 1 week before surgery.  Surgery was thus not completed.  Since that time, she has been evaluated by ENT repair of left olfactory groove CHF leak with the NSF.  She has been seen in postop 06/26/2020 as indicated in EMR.  At that time, it was noted that it was possible she  had COVID-19 immediately after her nasal surgery with persistent anosmia since that time.  It was unlikely this was a side effect of her surgery.  Today, 08/18/2020, she presents to clinic for repeat preoperative cardiac evaluation prior to total hip arthroplasty from the anterior approach.  She is in a wheelchair and joined today by her daughter.  She reports using walker at home.  Surgery is currently scheduled for 08/19/2020 with Dr. Rudene Christians.  Today, she denies any recent chest pain at rest or with exertion.  She reports her breathing is stable at rest.  She does experience some chronic dyspnea with minimal ambulation, which she reports is chronic for some time now.  She does have a very sedentary lifestyle, due to her hip pain and need for surgery, and therefore does admit to some level of deconditioning.  She estimates that she can walk with her walker from one end to another without getting SOB.  She is unable to walk in [place].  She reports left lower extremity swelling with erythema and pain.  She reportedly was evaluated by vascular surgery in early 2022 for this lower extremity edema and erythema with both venous and arterial scans unrevealing.  She feels as if her swelling has been stable since that time.  She denies any signs or symptoms of bleeding.  No tachypalpitations, presyncope, or syncope.  No recent falls.  She reports medication compliance.  She does not monitor her blood pressure at home but is agreeable to do so moving forward.  Home Medications   Current Outpatient Medications  Medication Instructions   Alcohol Swabs (ALCOHOL PREP) PADS Use twice daily prior to SQ injection of insulin to clean skin   atorvastatin (LIPITOR) 80 MG tablet TAKE 1 TABLET BY MOUTH EVERY DAY   B-12 1,000 mcg, Oral, Daily   Barberry-Oreg Grape-Goldenseal (BERBERINE COMPLEX PO) 1 tablet, Oral, 2 times daily with meals   Calcium Carbonate-Vitamin D 600-400 MG-UNIT tablet 1 tablet, Oral, 2 times daily    furosemide (LASIX) 20 mg, Oral, Daily   HYDROcodone-acetaminophen (NORCO) 10-325 MG tablet 1 tablet, Oral, Every 8 hours PRN   Hypertonic Nasal Wash (SINUS RINSE NA) 1 application, Nasal, Daily at bedtime   insulin glargine (LANTUS) 65 Units, Subcutaneous, Daily   Insulin Pen Needle (TRUEPLUS PEN NEEDLES) 31G X 6 MM MISC Use with lantus two times daily.   INSULIN SYRINGE 1CC/29G 29G X 1/2" 1 ML MISC For lantus injections twice daily   losartan (COZAAR) 25 MG tablet TAKE 1 TABLET BY MOUTH EVERY DAY   metoprolol succinate (TOPROL-XL) 25 MG 24 hr tablet TAKE 1 TABLET BY MOUTH EVERY DAY   Multiple Vitamins-Minerals (PRESERVISION AREDS 2 PO) 1 tablet, Oral, 2 times daily   nitrofurantoin (macrocrystal-monohydrate) (MACROBID) 100 mg, Oral, 2 times daily  Omega-3 1,000 mg, Oral, Daily   omeprazole (PRILOSEC) 40 MG capsule TAKE 1 CAPSULE BY MOUTH EVERY DAY   OneTouch Delica Lancets 34H MISC To check blood sugar daily  DX: E11.9   ONETOUCH VERIO test strip TO CHECK BLOOD SUGAR ONCE DAILY.   oxybutynin (DITROPAN-XL) 10 MG 24 hr tablet TAKE 1 TABLET BY MOUTH EVERYDAY AT BEDTIME   potassium chloride (KLOR-CON) 10 MEQ tablet 10 mEq, Oral, Daily   TRULICITY 1.5 DQ/2.2WL SOPN INJECT 1.5MG  INTO THE SKIN ONCE A WEEK AS DIRECTED   Vitamin D, Ergocalciferol, (DRISDOL) 1.25 MG (50000 UNIT) CAPS capsule TAKE ONE CAPSULE BY MOUTH ONCE WEEKLY ON THE SAME DAY EACH WEEK (ON FRIDAYS)     Review of Systems    She denies chest pain, palpitations, pnd, orthopnea, n, v, dizziness, syncope, weight gain, or early satiety.  She reports left lower extremity asymmetric edema, erythema, and pain.  She reports chronic / unchanged dyspnea with minimal ambulation, such as walking around [place], and in part attributed to deconditioning.  She ambulates with a walker at times and is mostly wheelchair-bound.  She reports left hip pain.  All other systems reviewed and are otherwise negative except as noted above.  Physical Exam     VS:  BP 130/64 (BP Location: Right Arm, Patient Position: Sitting, Cuff Size: Normal)   Pulse 87   Resp (!) 98   Ht 4\' 9"  (1.448 m)   Wt 149 lb (67.6 kg)   BMI 32.24 kg/m  , BMI Body mass index is 32.24 kg/m. GEN: Well nourished, well developed, in no acute distress.  Seated in a wheelchair.  Joined by her daughter. HEENT: normal. Neck: Supple, no JVD, carotid bruits, or masses. Cardiac: RRR, no murmurs, rubs, or gallops. No clubbing, cyanosis.  Left lower extremity greater than right with slight erythema towards the ankle and not tender to palpation.  No palpable cord.  Radials/DP/PT 2+ and equal bilaterally.  Respiratory:  Respirations regular and unlabored, distant breath sounds, clear to auscultation bilaterally. GI: Soft, nontender, nondistended, BS + x 4. MS: no deformity or atrophy. Skin: warm and dry, no rash. Neuro:  Strength and sensation are intact. Psych: Normal affect.  Accessory Clinical Findings    ECG personally reviewed by me today -sinus rhythm with first-degree AV block, PR interval 238 ms, occasional PVCs with compensatory pauses, known right bundle branch block, left anterior fascicular block, QRS 146 ms, trifascicular block, poor R wave progression in the inferior leads/precordial leads- no acute changes.  VITALS Reviewed today   Temp Readings from Last 3 Encounters:  06/05/20 97.9 F (36.6 C) (Oral)  05/15/20 98.3 F (36.8 C) (Oral)  02/11/20 98.3 F (36.8 C) (Oral)   BP Readings from Last 3 Encounters:  08/18/20 130/64  08/12/20 (!) 131/51  06/05/20 127/78   Pulse Readings from Last 3 Encounters:  08/18/20 87  08/12/20 79  06/05/20 (!) 108    Wt Readings from Last 3 Encounters:  08/18/20 149 lb (67.6 kg)  08/12/20 146 lb 13.2 oz (66.6 kg)  06/05/20 145 lb (65.8 kg)     LABS  reviewed today   Lab Results  Component Value Date   WBC 7.1 08/12/2020   HGB 12.4 08/12/2020   HCT 37.2 08/12/2020   MCV 91.9 08/12/2020   PLT 237 08/12/2020    Lab Results  Component Value Date   CREATININE 1.24 (H) 08/12/2020   BUN 37 (H) 08/12/2020   NA 138 08/12/2020   K 4.5 08/12/2020  CL 103 08/12/2020   CO2 27 08/12/2020   Lab Results  Component Value Date   ALT 12 08/12/2020   AST 17 08/12/2020   ALKPHOS 107 08/12/2020   BILITOT 1.1 08/12/2020   Lab Results  Component Value Date   CHOL 115 02/11/2020   HDL 67 02/11/2020   LDLCALC 29 02/11/2020   TRIG 105 02/11/2020   CHOLHDL 1.7 02/11/2020    Lab Results  Component Value Date   HGBA1C 7.3 (A) 05/15/2020   Lab Results  Component Value Date   TSH 1.220 12/28/2018     STUDIES/PROCEDURES reviewed today   LE studies 04/09/20 Summary:  Right: Resting right ankle-brachial index is within normal range. No  evidence of significant right lower extremity arterial disease. The right  toe-brachial index is normal.  Left: Resting left ankle-brachial index is within normal range. No  evidence of significant left lower extremity arterial disease. The left  toe-brachial index is normal.   04/21/20 Summary:  Left:  - No evidence of deep vein thrombosis seen in the left lower extremity,  from the common femoral through the popliteal veins.  - No evidence of superficial venous thrombosis in the left lower  extremity.  - There is no evidence of venous reflux seen in the left lower extremity.   Echo 11/18/19  1. Challenging images   2. Left ventricular ejection fraction, by grossly estimated at 40 to 45%  though difficult to visualize. The left ventricle has mildly decreased  function. The left ventricle demonstrates regional wall motion  abnormalities (images concerning for anterior  wall hypokinesis). Left ventricular diastolic parameters are consistent  with Grade I diastolic dysfunction (impaired relaxation).   3. Right ventricular systolic function is normal. The right ventricular  size is normal.   4. Valves not well visualized.   NM Study 05/12/16 (New addended  report) Pharmacological myocardial perfusion imaging study with no significant ischemia Large region of severe fixed perfusion defect in the mid anterior wall extending to apical region, anteroseptal wall, apical, apical inferior region, consistent with previous MI Large region of Wall motion abnormality noted in the region above EF estimated at 56% No EKG changes concerning for ischemia at peak stress or in recovery. Baseline EKG concerning for old anterior MI At least moderate risk scan given size of perfusion defect    Assessment & Plan    Preoperative cardiovascular evaluation --Denies any symptoms concerning for angina.   --Euvolemic and well compensated on exam. --Most recent echo from 2021 with EF 40-45%. --MPI 2018 without evidence of ischemia. --EKG without acute ST/T changes, PVCs, RBBB /LAFB (known). --Functional capacity poor at 1-4 METS and with consideration of her pain. --She is on insulin. Cr 1.24 (6/7/222) and below 2.0. --RCRI calculated risk equals class IV risk at 11% risk of MACE. --Reasonable to proceed with total hip arthroplasty 08/19/2020 with Dr. Rudene Christians without additional testing or intervention.  Echocardiogram ordered today for reassessment of EF, given reduced EF by 11/2019 echo without further ischemic workup at that time. Given stable sx, we will plan for echo after surgery unless new sx before tomorrow 08/19/20. We will attempt to scan her left lower extremity today, given asymmetric swelling, erythema, and pain. Of note, case discussed with DOD/primary cardiologist same day given the timing of the surgery as tomorrow 6/14.  She is not currently on any antiplatelet or anticoagulant therapy that would need held prior to surgery.   CAD s/p MI and angioplasty (1990s) -- No symptoms concerning for angina.  Stable / chronic DOE reported.  Most recent echo with reduced EF 55-60%  40-45% and WMA. As above, repeat echo ordered given reduced EF on previous echo. If EF still  reduced on updated echo, recommend further ischemic workup at that time with MPI +/- catheterization. Ideally, echo would be obtained before surgery; however, given timing of surgery and stable sx, acceptable to defer until after surgery per DOD. EKG is also stable without acute ST/T changes. Of note, s/p MI treated with angioplasty and local therapy in the 1990s. Prior stress test as above without ischemia and showing fixed perfusion defect in the anterior wall, EF nl.  --Continue BB, statin. She discontinued ASA.  We will hold off on restart of ASA given upcoming surgery.  Consider restart of ASA at RTC. Aggressive RF modification recommended at this time.  Dietary and lifestyle changes discussed.  Chronic combined systolic and diastolic heart failure -- Euvolemic on exam with some dyspnea reported with ambulation.  Considered also is sedentary lifestyle and BMI previous EF 40-45% with WMA concerning for anterior wall hypokinesis, G1DD. 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  --Repeat an echo to confirm EF, and if still reduced, recommend further ischemic workup with MPI +/- catheterization at that time (as above). -Continue BB, ARB, lasix 20mg  daily, Kcl tab 3mEq daily.  --Further recommendations regarding GDMT pending repeat echo.  Prolonged Qtc --Qtc 495. Most recent electrolytes wnl. Caution with QT prolonging medications during surgery.  Trifascicular block: RBBB, LAFB, 1st degree AVB --No presyncope or syncope. 1st degree AVB, RBBB, and LAFB. Caution with AV nodal blocking agents. Continue BB for now given rate 87bpm. Reassess at RTC with repeat EKG.   HTN, goal BP <130/80 -- BP relatively well controlled at 130/64.  Continue losartan 25mg  daily, metoprolol succinate 25mg  daily.  Recommended home monitoring with BP cuff and patient understanding.  Low-salt and fluid restrictions reviewed.  DM2, poorly controlled --Hgb A1C 7.3. --Consider SGLT1i at RTC, defer  for now given upcoming surgery.  --Continue insulin management per PCP  HLD, LDL goal <70  --12/21 Total cholesterol 115, LDL 29, HDL 67 -- Annual lipids and liver function recommended. --Continue high intensity with atorvastatin 80 mg daily.  Obesity --Diet changes encouraged given limited in activity at this time   Medication changes: None Labs ordered: None Studies / Imaging ordered: Echo, LLE [place] to rule out DVT Future considerations: If echo shows ongoing reduced EF, further ischemic workup, escalate GDMT, SGLT2i? Disposition: RTC after surgery and echo   Preop info fax to Dr. Rudene Christians / [facility] Phone: [phone]  Fax: [phone]  *Please be aware that the above documentation was completed voice recognition software and may contain dictation errors.      Arvil Chaco, PA-C 08/18/2020

## 2020-08-18 ENCOUNTER — Ambulatory Visit: Payer: Medicare Other | Admitting: Physician Assistant

## 2020-08-18 ENCOUNTER — Other Ambulatory Visit: Payer: Self-pay

## 2020-08-18 ENCOUNTER — Other Ambulatory Visit: Payer: Self-pay | Admitting: Family Medicine

## 2020-08-18 ENCOUNTER — Ambulatory Visit (INDEPENDENT_AMBULATORY_CARE_PROVIDER_SITE_OTHER): Payer: Medicare Other

## 2020-08-18 ENCOUNTER — Encounter: Payer: Self-pay | Admitting: Physician Assistant

## 2020-08-18 VITALS — BP 130/64 | HR 87 | Resp 98 | Ht <= 58 in | Wt 149.0 lb

## 2020-08-18 DIAGNOSIS — E785 Hyperlipidemia, unspecified: Secondary | ICD-10-CM | POA: Diagnosis not present

## 2020-08-18 DIAGNOSIS — Z01810 Encounter for preprocedural cardiovascular examination: Secondary | ICD-10-CM | POA: Diagnosis not present

## 2020-08-18 DIAGNOSIS — I5022 Chronic systolic (congestive) heart failure: Secondary | ICD-10-CM

## 2020-08-18 DIAGNOSIS — M79662 Pain in left lower leg: Secondary | ICD-10-CM | POA: Diagnosis not present

## 2020-08-18 DIAGNOSIS — E1165 Type 2 diabetes mellitus with hyperglycemia: Secondary | ICD-10-CM

## 2020-08-18 DIAGNOSIS — I10 Essential (primary) hypertension: Secondary | ICD-10-CM

## 2020-08-18 DIAGNOSIS — I739 Peripheral vascular disease, unspecified: Secondary | ICD-10-CM

## 2020-08-18 DIAGNOSIS — I4510 Unspecified right bundle-branch block: Secondary | ICD-10-CM

## 2020-08-18 DIAGNOSIS — I5020 Unspecified systolic (congestive) heart failure: Secondary | ICD-10-CM | POA: Diagnosis not present

## 2020-08-18 DIAGNOSIS — I2510 Atherosclerotic heart disease of native coronary artery without angina pectoris: Secondary | ICD-10-CM

## 2020-08-18 DIAGNOSIS — M4316 Spondylolisthesis, lumbar region: Secondary | ICD-10-CM

## 2020-08-18 MED ORDER — HYDROCODONE-ACETAMINOPHEN 10-325 MG PO TABS: 1.0000 | ORAL_TABLET | Freq: Three times a day (TID) | ORAL | 0 refills | Status: DC | PRN

## 2020-08-18 NOTE — Telephone Encounter (Signed)
Copied from [place] [id number]. Topic: Quick Communication - Rx Refill/Question >> Aug 18, 2020  9:37 AM Tessa Lerner A wrote: Medication: HYDROcodone-acetaminophen (NORCO) 10-325 MG tablet   Has the patient contacted their pharmacy? No. Patient has not contacted their pharmacy because of the medications classification   Preferred Pharmacy (with phone number or street name): [id number] - [place], [place]  Phone:  [phone] Fax:  [phone]  Agent: Please be advised that RX refills may take up to 3 business days. We ask that you follow-up with your pharmacy.

## 2020-08-18 NOTE — Patient Instructions (Addendum)
Medication Instructions:  No changes at this time  *If you need a refill on your cardiac medications before your next appointment, please call your pharmacy*   Lab Work: None  If you have labs (blood work) drawn today and your tests are completely normal, you will receive your results only by: [place] (if you have MyChart) OR A paper copy in the mail If you have any lab test that is abnormal or we need to change your treatment, we will call you to review the results.   Testing/Procedures: Your physician has requested that you have an echocardiogram.   Echocardiography is a painless test that uses sound waves to create images of your heart. It provides your doctor with information about the size and shape of your heart and how well your heart's chambers and valves are working. This procedure takes approximately one hour. There are no restrictions for this procedure.  Your physician has requested that you have a lower or upper extremity venous duplex.   This test is an ultrasound of the veins in the legs or arms. It looks at venous blood flow that carries blood from the heart to the legs or arms. Allow one hour for a Lower Venous exam. Allow thirty minutes for an Upper Venous exam. There are no restrictions or special instructions.     Follow-Up: At [facility], you and your health needs are our priority.  As part of our continuing mission to provide you with exceptional heart care, we have created designated Provider Care Teams.  These Care Teams include your primary Cardiologist (physician) and Advanced Practice Providers (APPs -  Physician Assistants and Nurse Practitioners) who all work together to provide you with the care you need, when you need it.   Your next appointment:   Follow up after surgery.   The format for your next appointment:   In Person  Provider:   Ida Rogue, MD or Marrianne Mood, PA-C

## 2020-08-18 NOTE — Telephone Encounter (Signed)
Requested medication (s) are due for refill today: Yes  Requested medication (s) are on the active medication list: Yes  Last refill:  07/14/20  Future visit scheduled: Yes  Notes to clinic:  See request.    Requested Prescriptions  Pending Prescriptions Disp Refills   HYDROcodone-acetaminophen (NORCO) 10-325 MG tablet 90 tablet 0    Sig: Take 1 tablet by mouth every 8 (eight) hours as needed.      Not Delegated - Analgesics:  Opioid Agonist Combinations Failed - 08/18/2020  9:43 AM      Failed - This refill cannot be delegated      Failed - Urine Drug Screen completed in last 360 days      Passed - Valid encounter within last 6 months    Recent Outpatient Visits           2 months ago Localized swelling of left lower extremity   [facility] [place], Anderson Malta M, PA-C   3 months ago 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 edema, with long-term current use of insulin [facility])   [facility] [place], Anderson Malta M, [place]   7 months ago SOB (shortness of breath)   [facility], Anderson Malta M, PA-C   10 months ago 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 edema, with long-term current use of insulin [facility])   [place], [place], [place]   1 year ago Type 2 diabetes mellitus with diabetic peripheral angiopathy without gangrene, with long-term current use of insulin [facility])   [facility] [place], Clearnce Sorrel, [place]       Future Appointments             In 2 weeks Bacigalupo, Dionne Bucy, MD [facility]

## 2020-08-19 ENCOUNTER — Inpatient Hospital Stay: Payer: Medicare Other

## 2020-08-19 ENCOUNTER — Inpatient Hospital Stay: Payer: Medicare Other | Admitting: Urgent Care

## 2020-08-19 ENCOUNTER — Inpatient Hospital Stay
Admission: RE | Admit: 2020-08-19 | Discharge: 2020-08-27 | DRG: 470 | Disposition: A | Discharge status: Skilled Nursing Facility | Payer: Medicare Other | Attending: Orthopedic Surgery | Admitting: Orthopedic Surgery

## 2020-08-19 ENCOUNTER — Encounter: Payer: Self-pay | Admitting: Orthopedic Surgery

## 2020-08-19 ENCOUNTER — Other Ambulatory Visit: Payer: Self-pay

## 2020-08-19 ENCOUNTER — Encounter
Admission: RE | Disposition: A | Discharge status: Skilled Nursing Facility | Payer: Self-pay | Source: Home / Self Care | Attending: Orthopedic Surgery

## 2020-08-19 DIAGNOSIS — Z823 Family history of stroke: Secondary | ICD-10-CM

## 2020-08-19 DIAGNOSIS — E785 Hyperlipidemia, unspecified: Secondary | ICD-10-CM | POA: Diagnosis present

## 2020-08-19 DIAGNOSIS — I739 Peripheral vascular disease, unspecified: Secondary | ICD-10-CM | POA: Diagnosis not present

## 2020-08-19 DIAGNOSIS — R0902 Hypoxemia: Secondary | ICD-10-CM | POA: Diagnosis not present

## 2020-08-19 DIAGNOSIS — J449 Chronic obstructive pulmonary disease, unspecified: Secondary | ICD-10-CM | POA: Diagnosis not present

## 2020-08-19 DIAGNOSIS — Y831 Surgical operation with implant of artificial internal device as the cause of abnormal reaction of the patient, or of later complication, without mention of misadventure at the time of the procedure: Secondary | ICD-10-CM | POA: Diagnosis present

## 2020-08-19 DIAGNOSIS — Z833 Family history of diabetes mellitus: Secondary | ICD-10-CM

## 2020-08-19 DIAGNOSIS — Z471 Aftercare following joint replacement surgery: Secondary | ICD-10-CM | POA: Diagnosis not present

## 2020-08-19 DIAGNOSIS — I10 Essential (primary) hypertension: Secondary | ICD-10-CM | POA: Diagnosis not present

## 2020-08-19 DIAGNOSIS — S32402A Unspecified fracture of left acetabulum, initial encounter for closed fracture: Secondary | ICD-10-CM | POA: Diagnosis not present

## 2020-08-19 DIAGNOSIS — Z7984 Long term (current) use of oral hypoglycemic drugs: Secondary | ICD-10-CM

## 2020-08-19 DIAGNOSIS — Z87440 Personal history of urinary (tract) infections: Secondary | ICD-10-CM

## 2020-08-19 DIAGNOSIS — Z825 Family history of asthma and other chronic lower respiratory diseases: Secondary | ICD-10-CM | POA: Diagnosis not present

## 2020-08-19 DIAGNOSIS — Z8249 Family history of ischemic heart disease and other diseases of the circulatory system: Secondary | ICD-10-CM | POA: Diagnosis not present

## 2020-08-19 DIAGNOSIS — G8918 Other acute postprocedural pain: Secondary | ICD-10-CM

## 2020-08-19 DIAGNOSIS — S7292XA Unspecified fracture of left femur, initial encounter for closed fracture: Secondary | ICD-10-CM | POA: Diagnosis not present

## 2020-08-19 DIAGNOSIS — Z87891 Personal history of nicotine dependence: Secondary | ICD-10-CM

## 2020-08-19 DIAGNOSIS — Z8701 Personal history of pneumonia (recurrent): Secondary | ICD-10-CM

## 2020-08-19 DIAGNOSIS — J9601 Acute respiratory failure with hypoxia: Secondary | ICD-10-CM | POA: Diagnosis not present

## 2020-08-19 DIAGNOSIS — I5022 Chronic systolic (congestive) heart failure: Secondary | ICD-10-CM | POA: Diagnosis present

## 2020-08-19 DIAGNOSIS — M810 Age-related osteoporosis without current pathological fracture: Secondary | ICD-10-CM | POA: Diagnosis not present

## 2020-08-19 DIAGNOSIS — D62 Acute posthemorrhagic anemia: Secondary | ICD-10-CM | POA: Diagnosis not present

## 2020-08-19 DIAGNOSIS — E11319 Type 2 diabetes mellitus with unspecified diabetic retinopathy without macular edema: Secondary | ICD-10-CM | POA: Diagnosis present

## 2020-08-19 DIAGNOSIS — F32A Depression, unspecified: Secondary | ICD-10-CM | POA: Diagnosis not present

## 2020-08-19 DIAGNOSIS — I110 Hypertensive heart disease with heart failure: Secondary | ICD-10-CM | POA: Diagnosis present

## 2020-08-19 DIAGNOSIS — E559 Vitamin D deficiency, unspecified: Secondary | ICD-10-CM | POA: Diagnosis not present

## 2020-08-19 DIAGNOSIS — T8484XA Pain due to internal orthopedic prosthetic devices, implants and grafts, initial encounter: Principal | ICD-10-CM | POA: Diagnosis present

## 2020-08-19 DIAGNOSIS — Z1159 Encounter for screening for other viral diseases: Secondary | ICD-10-CM | POA: Diagnosis not present

## 2020-08-19 DIAGNOSIS — T84091A Other mechanical complication of internal left hip prosthesis, initial encounter: Secondary | ICD-10-CM | POA: Diagnosis not present

## 2020-08-19 DIAGNOSIS — Z28311 Partially vaccinated for covid-19: Secondary | ICD-10-CM

## 2020-08-19 DIAGNOSIS — I2510 Atherosclerotic heart disease of native coronary artery without angina pectoris: Secondary | ICD-10-CM | POA: Diagnosis present

## 2020-08-19 DIAGNOSIS — Z20822 Contact with and (suspected) exposure to covid-19: Secondary | ICD-10-CM | POA: Diagnosis not present

## 2020-08-19 DIAGNOSIS — M247 Protrusio acetabuli: Secondary | ICD-10-CM | POA: Diagnosis not present

## 2020-08-19 DIAGNOSIS — Z8661 Personal history of infections of the central nervous system: Secondary | ICD-10-CM | POA: Diagnosis not present

## 2020-08-19 DIAGNOSIS — I252 Old myocardial infarction: Secondary | ICD-10-CM

## 2020-08-19 DIAGNOSIS — Z79899 Other long term (current) drug therapy: Secondary | ICD-10-CM

## 2020-08-19 DIAGNOSIS — Z7982 Long term (current) use of aspirin: Secondary | ICD-10-CM

## 2020-08-19 DIAGNOSIS — Z8673 Personal history of transient ischemic attack (TIA), and cerebral infarction without residual deficits: Secondary | ICD-10-CM | POA: Diagnosis not present

## 2020-08-19 DIAGNOSIS — Z9861 Coronary angioplasty status: Secondary | ICD-10-CM

## 2020-08-19 DIAGNOSIS — E875 Hyperkalemia: Secondary | ICD-10-CM | POA: Diagnosis not present

## 2020-08-19 DIAGNOSIS — Z87828 Personal history of other (healed) physical injury and trauma: Secondary | ICD-10-CM | POA: Diagnosis not present

## 2020-08-19 DIAGNOSIS — M1611 Unilateral primary osteoarthritis, right hip: Secondary | ICD-10-CM | POA: Diagnosis not present

## 2020-08-19 DIAGNOSIS — M1612 Unilateral primary osteoarthritis, left hip: Secondary | ICD-10-CM | POA: Diagnosis present

## 2020-08-19 DIAGNOSIS — I259 Chronic ischemic heart disease, unspecified: Secondary | ICD-10-CM | POA: Diagnosis not present

## 2020-08-19 DIAGNOSIS — Z4889 Encounter for other specified surgical aftercare: Secondary | ICD-10-CM | POA: Diagnosis not present

## 2020-08-19 DIAGNOSIS — M6281 Muscle weakness (generalized): Secondary | ICD-10-CM | POA: Diagnosis not present

## 2020-08-19 DIAGNOSIS — M25559 Pain in unspecified hip: Secondary | ICD-10-CM

## 2020-08-19 DIAGNOSIS — K219 Gastro-esophageal reflux disease without esophagitis: Secondary | ICD-10-CM | POA: Diagnosis not present

## 2020-08-19 DIAGNOSIS — Z96642 Presence of left artificial hip joint: Secondary | ICD-10-CM

## 2020-08-19 DIAGNOSIS — N179 Acute kidney failure, unspecified: Secondary | ICD-10-CM | POA: Diagnosis present

## 2020-08-19 DIAGNOSIS — Z743 Need for continuous supervision: Secondary | ICD-10-CM | POA: Diagnosis not present

## 2020-08-19 DIAGNOSIS — E119 Type 2 diabetes mellitus without complications: Secondary | ICD-10-CM | POA: Diagnosis not present

## 2020-08-19 DIAGNOSIS — Z419 Encounter for procedure for purposes other than remedying health state, unspecified: Secondary | ICD-10-CM

## 2020-08-19 DIAGNOSIS — R6889 Other general symptoms and signs: Secondary | ICD-10-CM | POA: Diagnosis not present

## 2020-08-19 DIAGNOSIS — R279 Unspecified lack of coordination: Secondary | ICD-10-CM | POA: Diagnosis not present

## 2020-08-19 DIAGNOSIS — Z794 Long term (current) use of insulin: Secondary | ICD-10-CM

## 2020-08-19 DIAGNOSIS — F419 Anxiety disorder, unspecified: Secondary | ICD-10-CM | POA: Diagnosis present

## 2020-08-19 DIAGNOSIS — E113293 Type 2 diabetes mellitus with mild nonproliferative diabetic retinopathy without macular edema, bilateral: Secondary | ICD-10-CM | POA: Diagnosis not present

## 2020-08-19 HISTORY — DX: Chronic systolic (congestive) heart failure: I50.22

## 2020-08-19 HISTORY — DX: Atherosclerotic heart disease of native coronary artery without angina pectoris: I25.10

## 2020-08-19 HISTORY — DX: Essential (primary) hypertension: I10

## 2020-08-19 HISTORY — DX: Unspecified malignant neoplasm of skin, unspecified: C44.90

## 2020-08-19 HISTORY — DX: Other ill-defined heart diseases: I51.89

## 2020-08-19 HISTORY — PX: HARDWARE REMOVAL: SHX979

## 2020-08-19 HISTORY — PX: TOTAL HIP ARTHROPLASTY: SHX124

## 2020-08-19 LAB — HEMOGLOBIN AND HEMATOCRIT, BLOOD
HCT: 19.8 % — ABNORMAL LOW (ref 36.0–46.0)
Hemoglobin: 6.3 g/dL — ABNORMAL LOW (ref 12.0–15.0)

## 2020-08-19 LAB — GLUCOSE, CAPILLARY
Glucose-Capillary: 146 mg/dL — ABNORMAL HIGH (ref 70–99)
Glucose-Capillary: 312 mg/dL — ABNORMAL HIGH (ref 70–99)
Glucose-Capillary: 336 mg/dL — ABNORMAL HIGH (ref 70–99)
Glucose-Capillary: 307 mg/dL — ABNORMAL HIGH (ref 70–99)
Glucose-Capillary: 267 mg/dL — ABNORMAL HIGH (ref 70–99)

## 2020-08-19 LAB — CREATININE, SERUM
Creatinine, Ser: 1.16 mg/dL — ABNORMAL HIGH (ref 0.44–1.00)
GFR, Estimated: 49 mL/min — ABNORMAL LOW (ref >60)

## 2020-08-19 LAB — PREPARE RBC (CROSSMATCH)

## 2020-08-19 SURGERY — REMOVAL, HARDWARE
Anesthesia: General | Site: Hip | Laterality: Left

## 2020-08-19 MED ORDER — ONDANSETRON HCL 4 MG PO TABS: 4.0000 mg | ORAL_TABLET | Freq: Four times a day (QID) | ORAL | Status: DC | PRN

## 2020-08-19 MED ORDER — METOCLOPRAMIDE HCL 5 MG/ML IJ SOLN: 5.0000 mg | Freq: Three times a day (TID) | INTRAMUSCULAR | Status: DC | PRN

## 2020-08-19 MED ORDER — PROPOFOL 10 MG/ML IV BOLUS
INTRAVENOUS | Status: DC | PRN
  Administered 2020-08-19: 100 mg via INTRAVENOUS

## 2020-08-19 MED ORDER — VITAMIN B-12 1000 MCG PO TABS
1000.0000 ug | ORAL_TABLET | Freq: Every day | ORAL | Status: DC
  Administered 2020-08-20 – 2020-08-27 (×8): 1000 ug via ORAL
  Filled 2020-08-19 (×8): qty 1

## 2020-08-19 MED ORDER — DEXAMETHASONE SODIUM PHOSPHATE 10 MG/ML IJ SOLN
INTRAMUSCULAR | Status: AC
  Filled 2020-08-19: qty 1

## 2020-08-19 MED ORDER — ONDANSETRON HCL 4 MG/2ML IJ SOLN
INTRAMUSCULAR | Status: AC
  Filled 2020-08-19: qty 2

## 2020-08-19 MED ORDER — ROCURONIUM BROMIDE 100 MG/10ML IV SOLN
INTRAVENOUS | Status: DC | PRN
  Administered 2020-08-19: 5 mg via INTRAVENOUS
  Administered 2020-08-19: 50 mg via INTRAVENOUS
  Administered 2020-08-19 (×2): 10 mg via INTRAVENOUS

## 2020-08-19 MED ORDER — OXYCODONE HCL 5 MG PO TABS: 5.0000 mg | ORAL_TABLET | Freq: Once | ORAL | Status: DC | PRN

## 2020-08-19 MED ORDER — PHENYLEPHRINE HCL-NACL 10-0.9 MG/250ML-% IV SOLN
INTRAVENOUS | Status: DC | PRN
  Administered 2020-08-19: 25 ug/min via INTRAVENOUS

## 2020-08-19 MED ORDER — DEXAMETHASONE SODIUM PHOSPHATE 10 MG/ML IJ SOLN
INTRAMUSCULAR | Status: DC | PRN
  Administered 2020-08-19: 4 mg via INTRAVENOUS

## 2020-08-19 MED ORDER — ATORVASTATIN CALCIUM 80 MG PO TABS
80.0000 mg | ORAL_TABLET | Freq: Every day | ORAL | Status: DC
  Administered 2020-08-19 – 2020-08-26 (×8): 80 mg via ORAL
  Filled 2020-08-19 (×2): qty 1
  Filled 2020-08-19 (×2): qty 4
  Filled 2020-08-19: qty 1
  Filled 2020-08-19: qty 4
  Filled 2020-08-19 (×2): qty 1
  Filled 2020-08-19 (×2): qty 4
  Filled 2020-08-19: qty 1
  Filled 2020-08-19 (×3): qty 4
  Filled 2020-08-19 (×3): qty 1

## 2020-08-19 MED ORDER — ACETAMINOPHEN 500 MG PO TABS
1000.0000 mg | ORAL_TABLET | Freq: Four times a day (QID) | ORAL | Status: AC
  Administered 2020-08-19 – 2020-08-20 (×4): 1000 mg via ORAL
  Filled 2020-08-19 (×3): qty 2

## 2020-08-19 MED ORDER — ACETAMINOPHEN 10 MG/ML IV SOLN
INTRAVENOUS | Status: DC | PRN
  Administered 2020-08-19: 1000 mg via INTRAVENOUS

## 2020-08-19 MED ORDER — FENTANYL CITRATE (PF) 100 MCG/2ML IJ SOLN
INTRAMUSCULAR | Status: AC
  Filled 2020-08-19: qty 2

## 2020-08-19 MED ORDER — FENTANYL CITRATE (PF) 100 MCG/2ML IJ SOLN
INTRAMUSCULAR | Status: DC | PRN
  Administered 2020-08-19: 50 ug via INTRAVENOUS
  Administered 2020-08-19: 25 ug via INTRAVENOUS
  Administered 2020-08-19: 50 ug via INTRAVENOUS
  Administered 2020-08-19: 25 ug via INTRAVENOUS
  Administered 2020-08-19: 50 ug via INTRAVENOUS
  Administered 2020-08-19 (×4): 25 ug via INTRAVENOUS
  Administered 2020-08-19 (×2): 50 ug via INTRAVENOUS

## 2020-08-19 MED ORDER — POTASSIUM CHLORIDE CRYS ER 10 MEQ PO TBCR
10.0000 meq | EXTENDED_RELEASE_TABLET | Freq: Every day | ORAL | Status: DC
  Administered 2020-08-20 – 2020-08-27 (×8): 10 meq via ORAL
  Filled 2020-08-19 (×8): qty 1

## 2020-08-19 MED ORDER — ONDANSETRON HCL 4 MG/2ML IJ SOLN
INTRAMUSCULAR | Status: DC | PRN
  Administered 2020-08-19: 4 mg via INTRAVENOUS

## 2020-08-19 MED ORDER — ROCURONIUM BROMIDE 10 MG/ML (PF) SYRINGE
PREFILLED_SYRINGE | INTRAVENOUS | Status: AC
  Filled 2020-08-19: qty 10

## 2020-08-19 MED ORDER — INSULIN ASPART 100 UNIT/ML IJ SOLN
INTRAMUSCULAR | Status: AC
  Administered 2020-08-19: 11 [IU] via SUBCUTANEOUS
  Filled 2020-08-19: qty 1

## 2020-08-19 MED ORDER — ALBUMIN HUMAN 5 % IV SOLN: INTRAVENOUS | Status: DC | PRN

## 2020-08-19 MED ORDER — SODIUM CHLORIDE 0.9 % IV SOLN
INTRAVENOUS | Status: DC | PRN
  Administered 2020-08-19: 25 ug/min via INTRAVENOUS

## 2020-08-19 MED ORDER — OXYBUTYNIN CHLORIDE ER 10 MG PO TB24
10.0000 mg | ORAL_TABLET | Freq: Every day | ORAL | Status: DC
  Administered 2020-08-20 – 2020-08-27 (×8): 10 mg via ORAL
  Filled 2020-08-19 (×8): qty 1

## 2020-08-19 MED ORDER — SUGAMMADEX SODIUM 200 MG/2ML IV SOLN
INTRAVENOUS | Status: DC | PRN
  Administered 2020-08-19: 140 mg via INTRAVENOUS

## 2020-08-19 MED ORDER — POLYETHYLENE GLYCOL 3350 17 G PO PACK
17.0000 g | PACK | Freq: Every day | ORAL | Status: DC | PRN
  Administered 2020-08-23: 17 g via ORAL
  Filled 2020-08-19: qty 1

## 2020-08-19 MED ORDER — CHLORHEXIDINE GLUCONATE 0.12 % MT SOLN: 15.0000 mL | Freq: Once | OROMUCOSAL | Status: AC

## 2020-08-19 MED ORDER — ENOXAPARIN SODIUM 40 MG/0.4ML IJ SOSY
40.0000 mg | PREFILLED_SYRINGE | INTRAMUSCULAR | Status: DC
  Administered 2020-08-20 – 2020-08-27 (×8): 40 mg via SUBCUTANEOUS
  Filled 2020-08-19 (×8): qty 0.4

## 2020-08-19 MED ORDER — ACETAMINOPHEN 10 MG/ML IV SOLN
INTRAVENOUS | Status: AC
  Filled 2020-08-19: qty 100

## 2020-08-19 MED ORDER — CEFAZOLIN SODIUM-DEXTROSE 2-4 GM/100ML-% IV SOLN
2.0000 g | INTRAVENOUS | Status: AC
  Administered 2020-08-19 (×2): 2 g via INTRAVENOUS

## 2020-08-19 MED ORDER — OXYCODONE HCL 5 MG PO TABS
10.0000 mg | ORAL_TABLET | ORAL | Status: DC | PRN
  Administered 2020-08-21: 15 mg via ORAL
  Filled 2020-08-19: qty 3
  Filled 2020-08-19 (×2): qty 2
  Filled 2020-08-19: qty 3

## 2020-08-19 MED ORDER — PROPOFOL 10 MG/ML IV BOLUS
INTRAVENOUS | Status: AC
  Filled 2020-08-19: qty 40

## 2020-08-19 MED ORDER — INSULIN GLARGINE 100 UNIT/ML ~~LOC~~ SOLN
65.0000 [IU] | Freq: Every day | SUBCUTANEOUS | Status: DC
  Administered 2020-08-19 – 2020-08-27 (×9): 65 [IU] via SUBCUTANEOUS
  Filled 2020-08-19 (×10): qty 0.65

## 2020-08-19 MED ORDER — OXYCODONE HCL 5 MG/5ML PO SOLN: 5.0000 mg | Freq: Once | ORAL | Status: DC | PRN

## 2020-08-19 MED ORDER — BUPIVACAINE-EPINEPHRINE 0.25% -1:200000 IJ SOLN
INTRAMUSCULAR | Status: DC | PRN
  Administered 2020-08-19: 30 mL

## 2020-08-19 MED ORDER — MENTHOL 3 MG MT LOZG
1.0000 | LOZENGE | OROMUCOSAL | Status: DC | PRN
  Filled 2020-08-19: qty 9

## 2020-08-19 MED ORDER — INSULIN ASPART 100 UNIT/ML IJ SOLN: 11.0000 [IU] | Freq: Once | INTRAMUSCULAR | Status: AC

## 2020-08-19 MED ORDER — ACETAMINOPHEN 500 MG PO TABS
ORAL_TABLET | ORAL | Status: AC
  Filled 2020-08-19: qty 2

## 2020-08-19 MED ORDER — CEFAZOLIN SODIUM-DEXTROSE 2-4 GM/100ML-% IV SOLN
2.0000 g | Freq: Four times a day (QID) | INTRAVENOUS | Status: AC
  Administered 2020-08-19 – 2020-08-20 (×2): 2 g via INTRAVENOUS
  Filled 2020-08-19 (×2): qty 100

## 2020-08-19 MED ORDER — ONDANSETRON HCL 4 MG/2ML IJ SOLN: 4.0000 mg | Freq: Four times a day (QID) | INTRAMUSCULAR | Status: DC | PRN

## 2020-08-19 MED ORDER — CEFAZOLIN SODIUM 1 G IJ SOLR
INTRAMUSCULAR | Status: AC
  Filled 2020-08-19: qty 20

## 2020-08-19 MED ORDER — BISACODYL 10 MG RE SUPP: 10.0000 mg | Freq: Every day | RECTAL | Status: DC | PRN

## 2020-08-19 MED ORDER — PHENOL 1.4 % MT LIQD
1.0000 | OROMUCOSAL | Status: DC | PRN
  Filled 2020-08-19: qty 177

## 2020-08-19 MED ORDER — HYDROMORPHONE HCL 1 MG/ML IJ SOLN
INTRAMUSCULAR | Status: AC
  Filled 2020-08-19: qty 1

## 2020-08-19 MED ORDER — DULAGLUTIDE 1.5 MG/0.5ML ~~LOC~~ SOAJ: 1.5000 mg | SUBCUTANEOUS | Status: DC

## 2020-08-19 MED ORDER — FUROSEMIDE 20 MG PO TABS
20.0000 mg | ORAL_TABLET | Freq: Every day | ORAL | Status: DC
  Administered 2020-08-20 – 2020-08-27 (×8): 20 mg via ORAL
  Filled 2020-08-19 (×8): qty 1

## 2020-08-19 MED ORDER — DOCUSATE SODIUM 100 MG PO CAPS
100.0000 mg | ORAL_CAPSULE | Freq: Two times a day (BID) | ORAL | Status: DC
  Administered 2020-08-20 – 2020-08-27 (×15): 100 mg via ORAL
  Filled 2020-08-19 (×18): qty 1

## 2020-08-19 MED ORDER — METHOCARBAMOL 500 MG PO TABS
500.0000 mg | ORAL_TABLET | Freq: Four times a day (QID) | ORAL | Status: DC | PRN
  Administered 2020-08-20: 500 mg via ORAL
  Filled 2020-08-19: qty 1

## 2020-08-19 MED ORDER — OCUVITE-LUTEIN PO CAPS
1.0000 | ORAL_CAPSULE | Freq: Every day | ORAL | Status: DC
  Administered 2020-08-20 – 2020-08-27 (×7): 1 via ORAL
  Filled 2020-08-19 (×9): qty 1

## 2020-08-19 MED ORDER — CHLORHEXIDINE GLUCONATE 0.12 % MT SOLN
OROMUCOSAL | Status: AC
  Administered 2020-08-19: 15 mL via OROMUCOSAL
  Filled 2020-08-19: qty 15

## 2020-08-19 MED ORDER — HYDROMORPHONE HCL 1 MG/ML IJ SOLN
0.5000 mg | INTRAMUSCULAR | Status: DC | PRN
  Administered 2020-08-19: 0.5 mg via INTRAVENOUS

## 2020-08-19 MED ORDER — ACETAMINOPHEN 325 MG PO TABS
325.0000 mg | ORAL_TABLET | Freq: Four times a day (QID) | ORAL | Status: DC | PRN
  Administered 2020-08-25: 650 mg via ORAL
  Filled 2020-08-19 (×2): qty 2

## 2020-08-19 MED ORDER — METOPROLOL SUCCINATE ER 25 MG PO TB24
25.0000 mg | ORAL_TABLET | Freq: Every day | ORAL | Status: DC
  Administered 2020-08-20 – 2020-08-27 (×8): 25 mg via ORAL
  Filled 2020-08-19 (×8): qty 1

## 2020-08-19 MED ORDER — METOCLOPRAMIDE HCL 10 MG PO TABS
5.0000 mg | ORAL_TABLET | Freq: Three times a day (TID) | ORAL | Status: DC | PRN
Start: 2020-08-19 — End: 2020-08-27

## 2020-08-19 MED ORDER — SODIUM CHLORIDE 0.9 % IV SOLN: INTRAVENOUS | Status: DC

## 2020-08-19 MED ORDER — METHOCARBAMOL 1000 MG/10ML IJ SOLN
500.0000 mg | Freq: Four times a day (QID) | INTRAMUSCULAR | Status: DC | PRN
  Filled 2020-08-19: qty 5

## 2020-08-19 MED ORDER — ALUM & MAG HYDROXIDE-SIMETH 200-200-20 MG/5ML PO SUSP: 30.0000 mL | ORAL | Status: DC | PRN

## 2020-08-19 MED ORDER — ALBUMIN HUMAN 5 % IV SOLN
INTRAVENOUS | Status: AC
  Filled 2020-08-19: qty 250

## 2020-08-19 MED ORDER — OXYCODONE HCL 5 MG PO TABS
5.0000 mg | ORAL_TABLET | ORAL | Status: DC | PRN
Start: 2020-08-19 — End: 2020-08-27
  Administered 2020-08-20: 10 mg via ORAL
  Administered 2020-08-20: 5 mg via ORAL
  Administered 2020-08-20 – 2020-08-22 (×8): 10 mg via ORAL
  Administered 2020-08-23 – 2020-08-24 (×2): 5 mg via ORAL
  Administered 2020-08-24: 10 mg via ORAL
  Administered 2020-08-25 (×2): 5 mg via ORAL
  Administered 2020-08-26: 10 mg via ORAL
  Administered 2020-08-27: 5 mg via ORAL
  Filled 2020-08-19 (×2): qty 2
  Filled 2020-08-19: qty 1
  Filled 2020-08-19: qty 2
  Filled 2020-08-19: qty 1
  Filled 2020-08-19 (×2): qty 2
  Filled 2020-08-19: qty 1
  Filled 2020-08-19: qty 2
  Filled 2020-08-19: qty 1
  Filled 2020-08-19 (×4): qty 2

## 2020-08-19 MED ORDER — ZOLPIDEM TARTRATE 5 MG PO TABS
5.0000 mg | ORAL_TABLET | Freq: Every evening | ORAL | Status: DC | PRN
  Administered 2020-08-20: 5 mg via ORAL
  Filled 2020-08-19: qty 1

## 2020-08-19 MED ORDER — LOSARTAN POTASSIUM 25 MG PO TABS
25.0000 mg | ORAL_TABLET | Freq: Every day | ORAL | Status: DC
  Administered 2020-08-19: 25 mg via ORAL
  Filled 2020-08-19: qty 1

## 2020-08-19 MED ORDER — PANTOPRAZOLE SODIUM 40 MG PO TBEC
80.0000 mg | DELAYED_RELEASE_TABLET | Freq: Every day | ORAL | Status: DC
  Administered 2020-08-20 – 2020-08-27 (×8): 80 mg via ORAL
  Filled 2020-08-19 (×8): qty 2

## 2020-08-19 MED ORDER — DIPHENHYDRAMINE HCL 12.5 MG/5ML PO ELIX: 12.5000 mg | ORAL_SOLUTION | ORAL | Status: DC | PRN

## 2020-08-19 MED ORDER — PROPOFOL 10 MG/ML IV BOLUS
INTRAVENOUS | Status: AC
  Filled 2020-08-19: qty 20

## 2020-08-19 MED ORDER — SODIUM CHLORIDE 0.9 % IV SOLN
INTRAVENOUS | Status: DC | PRN
  Administered 2020-08-19: 60 mL

## 2020-08-19 MED ORDER — FENTANYL CITRATE (PF) 100 MCG/2ML IJ SOLN: 25.0000 ug | INTRAMUSCULAR | Status: DC | PRN

## 2020-08-19 MED ORDER — TRAMADOL HCL 50 MG PO TABS
50.0000 mg | ORAL_TABLET | Freq: Four times a day (QID) | ORAL | Status: DC
  Administered 2020-08-19 – 2020-08-27 (×26): 50 mg via ORAL
  Filled 2020-08-19 (×26): qty 1

## 2020-08-19 MED ORDER — LIDOCAINE HCL (PF) 2 % IJ SOLN
INTRAMUSCULAR | Status: AC
  Filled 2020-08-19: qty 5

## 2020-08-19 MED ORDER — LIDOCAINE HCL (CARDIAC) PF 100 MG/5ML IV SOSY
PREFILLED_SYRINGE | INTRAVENOUS | Status: DC | PRN
  Administered 2020-08-19: 80 mg via INTRAVENOUS

## 2020-08-19 MED ORDER — HYDROMORPHONE HCL 1 MG/ML IJ SOLN: 0.5000 mg | INTRAMUSCULAR | Status: DC | PRN

## 2020-08-19 MED ORDER — PHENYLEPHRINE HCL (PRESSORS) 10 MG/ML IV SOLN
INTRAVENOUS | Status: DC | PRN
  Administered 2020-08-19 (×5): 100 ug via INTRAVENOUS
  Administered 2020-08-19: 50 ug via INTRAVENOUS

## 2020-08-19 MED ORDER — ORAL CARE MOUTH RINSE: 15.0000 mL | Freq: Once | OROMUCOSAL | Status: AC

## 2020-08-19 MED ORDER — ONDANSETRON HCL 4 MG/2ML IJ SOLN
4.0000 mg | Freq: Once | INTRAMUSCULAR | Status: AC
  Administered 2020-08-19: 4 mg via INTRAVENOUS

## 2020-08-19 MED ORDER — INSULIN ASPART 100 UNIT/ML IJ SOLN
0.0000 [IU] | Freq: Three times a day (TID) | INTRAMUSCULAR | Status: DC
  Administered 2020-08-20: 2 [IU] via SUBCUTANEOUS
  Administered 2020-08-20: 3 [IU] via SUBCUTANEOUS
  Administered 2020-08-20: 5 [IU] via SUBCUTANEOUS
  Administered 2020-08-21 – 2020-08-23 (×2): 2 [IU] via SUBCUTANEOUS
  Administered 2020-08-24: 3 [IU] via SUBCUTANEOUS
  Administered 2020-08-24: 5 [IU] via SUBCUTANEOUS
  Administered 2020-08-25 (×2): 3 [IU] via SUBCUTANEOUS
  Administered 2020-08-25: 2 [IU] via SUBCUTANEOUS
  Administered 2020-08-26: 3 [IU] via SUBCUTANEOUS
  Administered 2020-08-26 (×2): 2 [IU] via SUBCUTANEOUS
  Administered 2020-08-27: 5 [IU] via SUBCUTANEOUS
  Filled 2020-08-19 (×13): qty 1

## 2020-08-19 MED ORDER — MAGNESIUM CITRATE PO SOLN
1.0000 | Freq: Once | ORAL | Status: DC | PRN
  Filled 2020-08-19: qty 296

## 2020-08-19 SURGICAL SUPPLY — 88 items
BLADE SAGITTAL AGGR TOOTH XLG (BLADE) ×3 IMPLANT
BNDG COHESIVE 6X5 TAN STRL LF (GAUZE/BANDAGES/DRESSINGS) ×9 IMPLANT
CANISTER SUCT 1200ML W/VALVE (MISCELLANEOUS) ×3 IMPLANT
CANISTER WOUND CARE 500ML ATS (WOUND CARE) ×3 IMPLANT
CEMENT BONE 40GM (Cement) ×12 IMPLANT
CEMENT RESTRICTOR DEPUY SZ 1 (Cement) ×3 IMPLANT
CHLORAPREP W/TINT 26 (MISCELLANEOUS) ×3 IMPLANT
COVER BACK TABLE REUSABLE LG (DRAPES) ×3 IMPLANT
COVER WAND RF STERILE (DRAPES) ×3 IMPLANT
CUFF TOURN SGL QUICK 24 (TOURNIQUET CUFF)
CUFF TOURN SGL QUICK 34 (TOURNIQUET CUFF)
CUFF TRNQT CYL 24X4X16.5-23 (TOURNIQUET CUFF) IMPLANT
CUFF TRNQT CYL 34X4.125X (TOURNIQUET CUFF) IMPLANT
DRAPE 3/4 80X56 (DRAPES) ×9 IMPLANT
DRAPE C-ARM XRAY 36X54 (DRAPES) ×3 IMPLANT
DRAPE INCISE IOBAN 66X45 STRL (DRAPES) ×3 IMPLANT
DRAPE INCISE IOBAN 66X60 STRL (DRAPES) IMPLANT
DRAPE POUCH INSTRU U-SHP 10X18 (DRAPES) ×3 IMPLANT
DRESSING SURGICEL FIBRLLR 1X2 (HEMOSTASIS) ×4 IMPLANT
DRSG EMULSION OIL 3X8 NADH (GAUZE/BANDAGES/DRESSINGS) ×3 IMPLANT
DRSG MEPILEX SACRM 8.7X9.8 (GAUZE/BANDAGES/DRESSINGS) ×3 IMPLANT
DRSG OPSITE POSTOP 3X4 (GAUZE/BANDAGES/DRESSINGS) ×18 IMPLANT
DRSG OPSITE POSTOP 4X8 (GAUZE/BANDAGES/DRESSINGS) ×6 IMPLANT
DRSG SURGICEL FIBRILLAR 1X2 (HEMOSTASIS) ×6
ELECT BLADE 6.5 EXT (BLADE) ×3 IMPLANT
ELECT CAUTERY BLADE 6.4 (BLADE) ×3 IMPLANT
ELECT REM PT RETURN 9FT ADLT (ELECTROSURGICAL) ×3
ELECTRODE REM PT RTRN 9FT ADLT (ELECTROSURGICAL) ×2 IMPLANT
GAUZE SPONGE 4X4 12PLY STRL (GAUZE/BANDAGES/DRESSINGS) ×3 IMPLANT
GAUZE XEROFORM 1X8 LF (GAUZE/BANDAGES/DRESSINGS) ×3 IMPLANT
GLOVE SURG SYN 9.0  PF PI (GLOVE) ×6
GLOVE SURG SYN 9.0 PF PI (GLOVE) ×4 IMPLANT
GLOVE SURG UNDER POLY LF SZ9 (GLOVE) ×3 IMPLANT
GOWN SRG 2XL LVL 4 RGLN SLV (GOWNS) ×2 IMPLANT
GOWN STRL NON-REIN 2XL LVL4 (GOWNS) ×3
GOWN STRL REUS W/ TWL LRG LVL3 (GOWN DISPOSABLE) ×2 IMPLANT
GOWN STRL REUS W/TWL LRG LVL3 (GOWN DISPOSABLE) ×2
GUIDEPIN VERSANAIL DSP 3.2X444 (ORTHOPEDIC DISPOSABLE SUPPLIES) ×3 IMPLANT
HEMOVAC 400CC 10FR (MISCELLANEOUS) IMPLANT
HIP FEM HD L 28 (Head) ×3 IMPLANT
HOLDER FOLEY CATH W/STRAP (MISCELLANEOUS) ×3 IMPLANT
HOOD PEEL AWAY FLYTE STAYCOOL (MISCELLANEOUS) ×3 IMPLANT
IRRIGATION SURGIPHOR STRL (IV SOLUTION) IMPLANT
KIT PREVENA INCISION MGT 13 (CANNISTER) ×3 IMPLANT
KIT TURNOVER KIT A (KITS) ×3 IMPLANT
LINER DUAL MOB 50MM (Liner) ×3 IMPLANT
MANIFOLD NEPTUNE II (INSTRUMENTS) ×3 IMPLANT
MAT ABSORB  FLUID 56X50 GRAY (MISCELLANEOUS) ×3
MAT ABSORB FLUID 56X50 GRAY (MISCELLANEOUS) ×2 IMPLANT
NDL SAFETY ECLIPSE 18X1.5 (NEEDLE) ×2 IMPLANT
NEEDLE FILTER BLUNT 18X 1/2SAF (NEEDLE) ×1
NEEDLE FILTER BLUNT 18X1 1/2 (NEEDLE) ×2 IMPLANT
NEEDLE HYPO 18GX1.5 SHARP (NEEDLE) ×3
NEEDLE SPNL 20GX3.5 QUINCKE YW (NEEDLE) ×6 IMPLANT
NS IRRIG 1000ML POUR BTL (IV SOLUTION) ×3 IMPLANT
PACK EXTREMITY ARMC (MISCELLANEOUS) ×3 IMPLANT
PACK HIP COMPR (MISCELLANEOUS) ×3 IMPLANT
PAD ABD DERMACEA PRESS 5X9 (GAUZE/BANDAGES/DRESSINGS) ×6 IMPLANT
SCALPEL PROTECTED #10 DISP (BLADE) ×6 IMPLANT
SCALPEL PROTECTED #15 DISP (BLADE) ×6 IMPLANT
SHELL ACETABULAR SZ0 50 DME (Shell) ×3 IMPLANT
SOL PREP PVP 2OZ (MISCELLANEOUS) ×3
SOLUTION PREP PVP 2OZ (MISCELLANEOUS) ×2 IMPLANT
SPONGE DRAIN TRACH 4X4 STRL 2S (GAUZE/BANDAGES/DRESSINGS) ×3 IMPLANT
STAPLER SKIN PROX 35W (STAPLE) ×3 IMPLANT
STEM HIP FEMORAL SZ0 STD (Stem) ×3 IMPLANT
STRAP SAFETY 5IN WIDE (MISCELLANEOUS) ×3 IMPLANT
SUT DVC 2 QUILL PDO  T11 36X36 (SUTURE) ×3
SUT DVC 2 QUILL PDO T11 36X36 (SUTURE) ×2 IMPLANT
SUT ETHIBOND NAB CT1 #1 30IN (SUTURE) ×3 IMPLANT
SUT ETHILON 3-0 FS-10 30 BLK (SUTURE) ×3
SUT SILK 0 (SUTURE) ×3
SUT SILK 0 30XBRD TIE 6 (SUTURE) ×2 IMPLANT
SUT V-LOC 90 ABS DVC 3-0 CL (SUTURE) ×3 IMPLANT
SUT VIC AB 0 CT1 36 (SUTURE) ×3 IMPLANT
SUT VIC AB 1 CT1 36 (SUTURE) ×3 IMPLANT
SUT VIC AB 2-0 CT1 27 (SUTURE) ×3
SUT VIC AB 2-0 CT1 TAPERPNT 27 (SUTURE) ×2 IMPLANT
SUTURE EHLN 3-0 FS-10 30 BLK (SUTURE) ×2 IMPLANT
SYR 10ML LL (SYRINGE) ×3 IMPLANT
SYR 20ML LL LF (SYRINGE) ×3 IMPLANT
SYR 30ML LL (SYRINGE) ×3 IMPLANT
SYR 50ML LL SCALE MARK (SYRINGE) ×6 IMPLANT
SYR BULB IRRIG 60ML STRL (SYRINGE) ×3 IMPLANT
TAPE MICROFOAM 4IN (TAPE) ×3 IMPLANT
TOWEL OR 17X26 4PK STRL BLUE (TOWEL DISPOSABLE) ×3 IMPLANT
TRAY FOLEY MTR SLVR 16FR STAT (SET/KITS/TRAYS/PACK) ×3 IMPLANT
WATER STERILE IRR 1000ML POUR (IV SOLUTION) ×3 IMPLANT

## 2020-08-19 NOTE — Anesthesia Preprocedure Evaluation (Addendum)
Anesthesia Evaluation  Patient identified by MRN, date of birth, ID band Patient awake    Reviewed: Allergy & Precautions, NPO status , Patient's Chart, lab work & pertinent test results  History of Anesthesia Complications Negative for: history of anesthetic complications  Airway Mallampati: III  TM Distance: <3 FB Neck ROM: limited    Dental  (+) Chipped, Poor Dentition, Missing   Pulmonary COPD, former smoker,    + rhonchi        Cardiovascular Exercise Tolerance: Good hypertension, (-) angina+ CAD, + Past MI and + Peripheral Vascular Disease  Normal cardiovascular exam+ dysrhythmias      Neuro/Psych PSYCHIATRIC DISORDERS TIA Neuromuscular disease negative psych ROS   GI/Hepatic Neg liver ROS, GERD  Medicated and Controlled,  Endo/Other  diabetes, Type 2, Insulin Dependent  Renal/GU      Musculoskeletal  (+) Arthritis ,   Abdominal   Peds  Hematology negative hematology ROS (+)   Anesthesia Other Findings Past Medical History: No date: Anxiety No date: Arthritis No date: CAD (coronary artery disease) No date: Chickenpox No date: Chronic systolic heart failure (HCC) No date: COPD (chronic obstructive pulmonary disease) (HCC)     Comment:  mild-no inhalers No date: Coronary artery disease 11/2019: CSF leak     Comment:  left sinus No date: Depression No date: Diabetes mellitus type 2, uncomplicated (HCC) No date: Diabetic retinopathy ([place]) 2011: Dupuytren contracture     Comment:  s/p surgery to LEFT hand No date: Frequent urinary tract infections No date: GERD (gastroesophageal reflux disease) No date: Grade I diastolic dysfunction No date: HTN (hypertension) No date: Hyperlipidemia, unspecified No date: Irritable bowel syndrome 11/2019: Meningitis 1994: MI (myocardial infarction) ([place])     Comment:  no stent No date: Neuromuscular disorder ([place])     Comment:  nerve pain in back and legs No  date: Osteoporosis 2021: Pneumonia No date: RBBB 11/2019: Sepsis ([place]) No date: Skin cancer 2014: TIA (transient ischemic attack) No date: Vitamin D deficiency, unspecified No date: Wheezing  Past Surgical History: No date: BACK SURGERY     Comment:  lower due to spinal stenosis 1994: CARDIAC CATHETERIZATION No date: CATARACT EXTRACTION, BILATERAL; Bilateral No date: COLONOSCOPY     Comment:  x3 02/08/2019: COLONOSCOPY WITH PROPOFOL; N/A     Comment:  Procedure: COLONOSCOPY WITH PROPOFOL;  Surgeon: [name], MD;  [facility] ENDOSCOPY;  Service:               Gastroenterology;  Laterality: N/A; 1994: CORONARY ANGIOPLASTY 2011: DUPUYTREN CONTRACTURE RELEASE; Left 11/2013: HIP FRACTURE SURGERY 08/21/2015: PR COLSC FLX W/REMOVAL LESION BY HOT BX FORCEPS      Comment:  Procedure: COLONOSCOPY, FLEXIBLE, PROXIMAL TO SPLENIC               FLEXURE; W/REMOVAL TUMOR/POLYP/OTHER LESION, HOT BX               FORCEP/CAUTE; Surgeon: Carlena Hurl, MD;               Location: OR [place]; Service: General Surgery No date: REPAIR DURAL / CSF LEAK     Comment:  to left sinus 11/20/2019: TEE WITHOUT CARDIOVERSION; N/A     Comment:  Procedure: TRANSESOPHAGEAL ECHOCARDIOGRAM (TEE);                Surgeon: Nelva Bush, MD;  Location: [facility] ORS;  Service: Cardiovascular;  Laterality: N/A;  BMI    Body Mass Index: 32.25 kg/m      Reproductive/Obstetrics negative OB ROS                             Anesthesia Physical Anesthesia Plan  ASA: 3  Anesthesia Plan: General ETT   Post-op Pain Management:    Induction: Intravenous  PONV Risk Score and Plan: Ondansetron, Dexamethasone, Midazolam and Treatment may vary due to age or medical condition  Airway Management Planned: Oral ETT  Additional Equipment:   Intra-op Plan:   Post-operative Plan: Extubation in OR  Informed Consent: I have reviewed the patients  History and Physical, chart, labs and discussed the procedure including the risks, benefits and alternatives for the proposed anesthesia with the patient or authorized representative who has indicated his/her understanding and acceptance.     Dental Advisory Given  Plan Discussed with: Anesthesiologist, CRNA and Surgeon  Anesthesia Plan Comments: (After speaking with Dr. Rudene Christians we will plan for GA 2/2 the possible length of the case  Patient consented for risks of anesthesia including but not limited to:  - adverse reactions to medications - damage to eyes, teeth, lips or other oral mucosa - nerve damage due to positioning  - sore throat or hoarseness - Damage to heart, brain, nerves, lungs, other parts of body or loss of life  Patient voiced understanding.)       Anesthesia Quick Evaluation

## 2020-08-19 NOTE — Progress Notes (Signed)
Radiologist called and notified this RN that there were new acute fractures of the acetabulum. This RN spoke with Morene Antu OR RN who notified Dr. Rudene Christians of new results. Dr. Rudene Christians aware. No new orders at this time.

## 2020-08-19 NOTE — Anesthesia Procedure Notes (Signed)
Procedure Name: Intubation Date/Time: 08/19/2020 9:44 AM Performed by: Lowry Bowl, CRNA Pre-anesthesia Checklist: Patient identified, Emergency Drugs available, Suction available and Patient being monitored Patient Re-evaluated:Patient Re-evaluated prior to induction Oxygen Delivery Method: Circle system utilized Preoxygenation: Pre-oxygenation with 100% oxygen Induction Type: IV induction Ventilation: Mask ventilation without difficulty Laryngoscope Size: 3 and McGraph (Elective use of videoscope) Grade View: Grade I Tube type: Oral Tube size: 7.0 mm Number of attempts: 1 Airway Equipment and Method: Stylet and Video-laryngoscopy Placement Confirmation: ETT inserted through vocal cords under direct vision, positive ETCO2 and breath sounds checked- equal and bilateral Secured at: 20 cm Tube secured with: Tape Dental Injury: Teeth and Oropharynx as per pre-operative assessment

## 2020-08-19 NOTE — Transfer of Care (Signed)
Immediate Anesthesia Transfer of Care Note  Patient: Andrea [name]  Procedure(s) Performed: HARDWARE REMOVAL (Left) TOTAL HIP ARTHROPLASTY ANTERIOR APPROACH (Left: Hip)  Patient Location: PACU  Anesthesia Type:General  Level of Consciousness: awake, drowsy and patient cooperative  Airway & Oxygen Therapy: Patient Spontanous Breathing and Patient connected to face mask oxygen  Post-op Assessment: Report given to RN and Post -op Vital signs reviewed and stable  Post vital signs: Reviewed and stable  Last Vitals:  Vitals Value Taken Time  BP 108/56 08/19/20 1400  Temp    Pulse 94 08/19/20 1405  Resp 14 08/19/20 1405  SpO2 100 % 08/19/20 1405  Vitals shown include unvalidated device data.  Last Pain:  Vitals:   08/19/20 0910  TempSrc: Oral  PainSc: 3      Temp 81.1    Complications: No notable events documented.

## 2020-08-19 NOTE — Progress Notes (Signed)
Pt resting in bed with eyes closed.  Denies pain NAD, VSS.  Will continue to monitor

## 2020-08-19 NOTE — Op Note (Signed)
08/19/2020  1:58 PM  PATIENT:  Andrea [name]  75 y.o. 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  POST-OPERATIVE DIAGNOSIS:  Painful orthopaedic hardware T84.84XA  PROCEDURE:  Procedure(s): HARDWARE REMOVAL (Left) TOTAL HIP ARTHROPLASTY ANTERIOR APPROACH (Left)  SURGEON: Laurene Footman, MD  ASSISTANTS: None  ANESTHESIA:   general  EBL:  Total I/O In: 1860 [I.V.:900; IV Piggyback:960] Out: 900 [Urine:200; Blood:700]  BLOOD ADMINISTERED:none  DRAINS:  Incisional wound VAC l  LOCAL MEDICATIONS USED:  MARCAINE    and OTHER Exparel  SPECIMEN:  Source of Specimen:    eft femoral head  DISPOSITION OF SPECIMEN:  PATHOLOGY  COUNTS:  YES  TOURNIQUET:  * No tourniquets in log *  IMPLANTS: Medacta  50 mm  Mpact TM cup and liner with metal L 28 mm head and 0 standard cemented Quadra stem  DICTATION: .Dragon Dictation patient was brought the operating room and after adequate anesthesia was obtained the patient was placed on the operative table with Medacta attachment.  Right leg was on a well-padded table left foot in the traction boot.  After prepping and draping in the usual sterile fashion with prior incision was marked the prior rod was removed with going through the prior 3 incisions loosening the setscrew proximally removing the leg screw without difficulty removing the distal screw.  The rod was quite difficult to remove secondary to difficulty getting the extractor attached to the proximal rod Lond[place] was done and the rod was removed going through the standard anterior approach incision over into the T FL with the tensor identified incised and retracted laterally the capsule was exposed a capsulotomy performed with the deep retractors placed around this followed by femoral neck cut.  The femoral head did not have much wear but the acetabulum was quite degenerated.  The acetabular bone was quite soft and there was a central  defect after reaming with a 48 mm reamer giving good bleeding bone a 50 mm cup gave better stability.  Attention was then turned to canal this difficult secondary to a great deal of posttraumatic deformity with after again entering the canal though approach was carried out to #1 so a cemented stem was chosen secondary to her border bone quality with a cement restrictor placed cement inserted followed by placement of the 0 stem.  L head was applied to restore some of her leg length and unfortunately on reduction the cup moved so the stem had to be removed it slid out of the canal when trying to remove just the head and so after repeat revising the cup to a better position the cement stem was recemented with the cement on cement technique with again excess cement removed this time after reduction there is good stable reduction with good position of components.  The wounds were thoroughly irrigated including Betadine irrigation and pulse lavage.  The prior incisions were closed with #1 Vicryl deep 2-0 Vicryl subcutaneously and skin staples with the anterior hip approach closed with a heavy Quill for the muscular fascia 3-0 V-Loc subcutaneous and skin staples with the above local inserted throughout the case around the capsule and soft tissues.  Dressings of Xeroform 4 x 4's ABD and foam tape for the prior incisions with a wound VAC over the anterior hip incision.  PLAN OF CARE: Admit to inpatient   PATIENT DISPOSITION:  PACU - hemodynamically stable.

## 2020-08-19 NOTE — H&P (Signed)
Chief Complaint  Patient presents with   Left Hip - Follow-up, Pain    History of the Present Illness: Andrea [name] is a 75 y.o. female here today.   The patient presents for evaluation of left reverse obliquity hip fracture several years ago that has healed. She had a recent CT that showed mild arthritis and healed fracture, with gluteus medius weakness.  The patient presents with an adult female who states the patient's knee is doing very well, but she has not lost any weight. She locates her pain to the lateral aspect of her left hip. The patient states she can walk from a handicap spot at [facility] to the scooter, but it is painful. The patient presents with her daughter who states the patient's pain has gradually gotten worse. The daughter reports the pain is so bad it takes the patient's breath away. She notes she would like her mother to be able to get away out of the wheelchair or walker.  The patient has diabetes. Her A1c was 7.3 in 05/2019. She had meningitis last year, as well as dehydration and pneumonia. She then had a leg fracture.   I have reviewed past medical, surgical, social and family history, and allergies as documented in the EMR.  Past Medical History: Past Medical History:  Diagnosis Date   Chickenpox   Diabetes mellitus type 2, uncomplicated (CMS-HCC)   Diabetic retinopathy (CMS-HCC)   Heart attack (CMS-HCC) 1994   Hyperlipidemia   Irritable bowel syndrome   Vitamin D deficiency   Past Surgical History: Past Surgical History:  Procedure Laterality Date   CORONARY ANGIOPLASTY 1994   Heart catheterization 1994   left hand surgery   Open reduction and internal fixation with intramedullary device Left 11/25/13   Past Family History: Family History  Problem Relation Age of Onset   High blood pressure (Hypertension) Mother   Stroke Mother   Asthma Brother   Medications: Current Outpatient Medications Ordered in Epic  Medication Sig Dispense Refill    acetaminophen (TYLENOL) 500 mg capsule Take 2 tablets by mouth as needed for pain.   aspirin 81 MG EC tablet Take 81 mg by mouth once daily.   atorvastatin (LIPITOR) 20 MG tablet Take 20 mg by mouth once daily.   calcium carbonate-vitamin D3 600 mg(1,500mg ) -800 unit Tab Take by mouth Take 1 tablet by mouth in the morning and at bedtime   cyanocobalamin, vitamin B-12, 1,000 mcg Cap Take 1 capsule by mouth once daily   dulaglutide (TRULICITY) 1.5 SH/7.0 mL subcutaneous injection Inject 1.5 mg subcutaneously once a week   ergocalciferol, vitamin D2, 1,250 mcg (50,000 unit) capsule TAKE ONE CAPSULE BY MOUTH ONCE WEEKLY ON THE SAME DAY EACH WEEK (ON FRIDAYS   glipiZIDE (GLUCOTROL) 5 MG tablet Take 5 mg by mouth 2 (two) times daily before meals.   HYDROcodone-acetaminophen (NORCO) 10-325 mg tablet Take 1 tablet by mouth every 8 (eight) hours as needed   insulin GLARGINE (LANTUS U-100 INSULIN) injection (concentration 100 units/mL) Inject 65 Units subcutaneously 2 (two) times daily   losartan (COZAAR) 50 MG tablet Take 50 mg by mouth once daily   metoprolol succinate (TOPROL-XL) 25 MG XL tablet Take 0.5 tablets by mouth once daily   omega-3 fatty acids/fish oil 340-1,000 mg capsule Take 1 capsule by mouth once daily.   omeprazole (PRILOSEC) 40 MG DR capsule Take 1 capsule by mouth once daily   oxybutynin (DITROPAN-XL) 10 MG XL tablet Take 10 mg by mouth once daily   potassium  chloride (KLOR-CON) 10 MEQ ER tablet Take 1 tablet by mouth once daily   sertraline (ZOLOFT) 25 MG tablet Take 1 tablet by mouth as needed   UNABLE TO FIND 1 inch Heel and Sole Lift on Left Shoe 1 each 0   vit C/E/zinc ox/copp/lut/zeax (ICAPS AREDS2 ORAL) Take 2 capsules by mouth once daily   No current Epic-ordered facility-administered medications on file.   Allergies: No Known Allergies   Body mass index is 28.55 kg/m.  Review of Systems: A comprehensive 14 point ROS was performed, reviewed, and the pertinent  orthopaedic findings are documented in the HPI.  Vitals:  08/01/20 1023  BP: 122/78    General Physical Examination:   General/Constitutional: No apparent distress: well-nourished and well developed. Eyes: Pupils equal, round with synchronous movement. Lungs: Clear to auscultation HEENT: Marked for upper dentures and lower dentures, not yet ready. Vascular: No edema, swelling or tenderness, except as noted in detailed exam. Cardiac: Heart rate and rhythm is regular. Integumentary: No impressive skin lesions present, except as noted in detailed exam. Neuro/Psych: Normal mood and affect, oriented to person, place and time.  Musculoskeletal Examination:  On exam, tenderness at the left greater trochanter, at her hardware location. Left hip internal rotation is 10 degrees, external is 60 degrees. Pain in the lateral hip on the left. Unequal leg lengths.  Radiographs:  No new imaging studies were obtained today.  Assessment: ICD-10-CM  1. Painful orthopaedic hardware (CMS-HCC) T84.84XA  2. 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  We discussed the patient's prior CT findings. I explained she has mild to moderate left hip arthritis and some weak muscles. A screw in her hardware is prominent. She also may have a gluteal abductor tear that may need to be repaired. We discussed removal of hardware versus left hip arthroplasty. I explained the surgery in detail, including arthroplasty that would involve taking the hardware out and putting a stem that goes in the bone, as well as check the abductor tendon at the same time. I would also try to make her leg a little longer.   We will schedule the patient for surgery in 08/2020.  Surgical Risks:  The nature of the condition and the proposed procedure has been reviewed in detail with the patient. Surgical versus non-surgical options and prognosis for recovery have been reviewed and the inherent  risks and benefits of each have been discussed including the risks of infection, bleeding, injury to nerves/blood vessels/tendons, incomplete relief of symptoms, persisting pain and/or stiffness, loss of function, complex regional pain syndrome, failure of the procedure, as appropriate.  Teeth: Upper dentures, lower dentures, not yet ready.  Attestation: I, Dawn Royse, am documenting for [employer], MD utilizing [place].  Reviewed  H+P. No changes noted.

## 2020-08-20 ENCOUNTER — Encounter: Payer: Self-pay | Admitting: Orthopedic Surgery

## 2020-08-20 LAB — BASIC METABOLIC PANEL
Anion gap: 5 (ref 5–15)
BUN: 33 mg/dL — ABNORMAL HIGH (ref 8–23)
CO2: 20 mmol/L — ABNORMAL LOW (ref 22–32)
Calcium: 7.7 mg/dL — ABNORMAL LOW (ref 8.9–10.3)
Chloride: 114 mmol/L — ABNORMAL HIGH (ref 98–111)
Creatinine, Ser: 1.3 mg/dL — ABNORMAL HIGH (ref 0.44–1.00)
GFR, Estimated: 43 mL/min — ABNORMAL LOW (ref >60)
Glucose, Bld: 192 mg/dL — ABNORMAL HIGH (ref 70–99)
Potassium: 4.8 mmol/L (ref 3.5–5.1)
Sodium: 139 mmol/L (ref 135–145)

## 2020-08-20 LAB — CBC
HCT: 19.1 % — ABNORMAL LOW (ref 36.0–46.0)
Hemoglobin: 6.2 g/dL — ABNORMAL LOW (ref 12.0–15.0)
MCH: 29.5 pg (ref 26.0–34.0)
MCHC: 32.5 g/dL (ref 30.0–36.0)
MCV: 91 fL (ref 80.0–100.0)
Platelets: 99 10*3/uL — ABNORMAL LOW (ref 150–400)
RBC: 2.1 MIL/uL — ABNORMAL LOW (ref 3.87–5.11)
RDW: 16.8 % — ABNORMAL HIGH (ref 11.5–15.5)
WBC: 10 10*3/uL (ref 4.0–10.5)
nRBC: 0 % (ref 0.0–0.2)

## 2020-08-20 LAB — GLUCOSE, CAPILLARY
Glucose-Capillary: 142 mg/dL — ABNORMAL HIGH (ref 70–99)
Glucose-Capillary: 245 mg/dL — ABNORMAL HIGH (ref 70–99)
Glucose-Capillary: 185 mg/dL — ABNORMAL HIGH (ref 70–99)
Glucose-Capillary: 120 mg/dL — ABNORMAL HIGH (ref 70–99)

## 2020-08-20 LAB — HEMOGLOBIN AND HEMATOCRIT, BLOOD
HCT: 29.4 % — ABNORMAL LOW (ref 36.0–46.0)
Hemoglobin: 10 g/dL — ABNORMAL LOW (ref 12.0–15.0)

## 2020-08-20 LAB — PREPARE RBC (CROSSMATCH)

## 2020-08-20 MED ORDER — LOSARTAN POTASSIUM 25 MG PO TABS
25.0000 mg | ORAL_TABLET | Freq: Every day | ORAL | Status: DC
  Administered 2020-08-21 – 2020-08-26 (×6): 25 mg via ORAL
  Filled 2020-08-20 (×7): qty 1

## 2020-08-20 MED ORDER — SODIUM CHLORIDE 0.9% IV SOLUTION: Freq: Once | INTRAVENOUS | Status: AC

## 2020-08-20 MED ORDER — FE FUMARATE-B12-VIT C-FA-IFC PO CAPS
1.0000 | ORAL_CAPSULE | Freq: Two times a day (BID) | ORAL | Status: DC
  Administered 2020-08-20 – 2020-08-27 (×15): 1 via ORAL
  Filled 2020-08-20 (×16): qty 1

## 2020-08-20 NOTE — Progress Notes (Signed)
Subjective: 1 Day Post-Op Procedure(s) (LRB): HARDWARE REMOVAL (Left) TOTAL HIP ARTHROPLASTY ANTERIOR APPROACH (Left) Patient reports pain as mild.   Patient is well, and has had no acute complaints or problems Denies any CP, SOB, ABD pain. We will continue therapy today.  Plan is to go Skilled nursing facility after hospital stay.  Objective: Vital signs in last 24 hours: Temp:  [96.6 F (35.9 C)-98.6 F (37 C)] 98.6 F (37 C) (06/15 0801) Pulse Rate:  [75-96] 90 (06/15 0801) Resp:  [12-25] 16 (06/15 0801) BP: (91-150)/(39-62) 98/47 (06/15 0801) SpO2:  [93 %-100 %] 93 % (06/15 0801) Weight:  [67.6 kg] 67.6 kg (06/14 0910)  Intake/Output from previous day: 06/14 0701 - 06/15 0700 In: 2410 [I.V.:1100; Blood:600; IV Piggyback:710] Out: 1800 [Urine:800; Emesis/NG output:300; Blood:700] Intake/Output this shift: No intake/output data recorded.  Recent Labs    08/19/20 1407 08/20/20 0409  HGB 6.3* 6.2*   Recent Labs    08/19/20 1407 08/20/20 0409  WBC  --  10.0  RBC  --  2.10*  HCT 19.8* 19.1*  PLT  --  99*   Recent Labs    08/19/20 1407 08/20/20 0409  NA  --  139  K  --  4.8  CL  --  114*  CO2  --  20*  BUN  --  33*  CREATININE 1.16* 1.30*  GLUCOSE  --  192*  CALCIUM  --  7.7*   No results for input(s): LABPT, INR in the last 72 hours.  EXAM General - Patient is Alert, Appropriate, and Oriented Extremity - Neurovascular intact Sensation intact distally Intact pulses distally Dorsiflexion/Plantar flexion intact No cellulitis present Compartment soft Dressing - scant drainage and Praveena is intact with no drainage in canister but having some proximal posterior dressing drainage that is reinforced with foam tape. Motor Function - intact, moving foot and toes well on exam.   Past Medical History:  Diagnosis Date   Anxiety    Arthritis    CAD (coronary artery disease)    Chickenpox    Chronic systolic heart failure (HCC)    COPD (chronic  obstructive pulmonary disease) (HCC)    mild-no inhalers   Coronary artery disease    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   Depression    Diabetes mellitus type 2, uncomplicated ([place])    Diabetic retinopathy ([place])    Dupuytren contracture 2011   s/p surgery to LEFT hand   Frequent urinary tract infections    GERD (gastroesophageal reflux disease)    Grade I diastolic dysfunction    HTN (hypertension)    Hyperlipidemia, unspecified    Irritable bowel syndrome    Meningitis 11/2019   MI (myocardial infarction) ([place]) 1994   no stent   Neuromuscular disorder (HCC)    nerve pain in back and legs   Osteoporosis    Pneumonia 2021   RBBB    Sepsis ([place]) 11/2019   Skin cancer    TIA (transient ischemic attack) 2014   Vitamin D deficiency, unspecified    Wheezing     Assessment/Plan:   1 Day Post-Op Procedure(s) (LRB): HARDWARE REMOVAL (Left) TOTAL HIP ARTHROPLASTY ANTERIOR APPROACH (Left) Active Problems:   Status post total hip replacement, left  Estimated body mass index is 32.25 kg/m as calculated from the following:   Height as of this encounter: 4\' 9"  (1.448 m).   Weight as of this encounter: 67.6 kg. Advance diet Up with therapy Acute post op blood loss anemia -  hemoglobin 6.2.  Transfused 2 units of packed red blood cells and recheck hemoglobin posttransfusion.  Patient started on iron supplement. Vital signs stable.  Blood pressure soft.  We will hold losartan Recheck labs in the morning Care management to assist with discharge to skilled nursing facility  DVT Prophylaxis - Lovenox, TED hose, and SCDs left Weight-Bearing as tolerated to left leg   T. Rachelle Hora, PA-C [place] 08/20/2020, 8:10 AM

## 2020-08-20 NOTE — Progress Notes (Addendum)
PT Cancellation Note  Patient Details Name: Andrea [name] MRN: [id number] DOB: August 09, 1945   Cancelled Treatment:    Reason Eval/Treat Not Completed: Medical issues which prohibited therapy Checked in again this afternoon, getting second unit of blood with plenty of time to complete.  Will initiate PT tomorrow AM.   Kreg Shropshire, DPT 08/20/2020, 3:29 PM

## 2020-08-20 NOTE — Anesthesia Postprocedure Evaluation (Signed)
Anesthesia Post Note  Patient: Andrea [name]  Procedure(s) Performed: HARDWARE REMOVAL (Left) TOTAL HIP ARTHROPLASTY ANTERIOR APPROACH (Left: Hip)  Patient location during evaluation: PACU Anesthesia Type: General Level of consciousness: awake and alert Pain management: pain level controlled Vital Signs Assessment: post-procedure vital signs reviewed and stable Respiratory status: spontaneous breathing, nonlabored ventilation, respiratory function stable and patient connected to nasal cannula oxygen Cardiovascular status: blood pressure returned to baseline and stable Postop Assessment: no apparent nausea or vomiting Anesthetic complications: no   No notable events documented.   Last Vitals:  Vitals:   08/19/20 2354 08/20/20 0441  BP: (!) 97/47 (!) 99/43  Pulse: 89 88  Resp: 20 18  Temp: (!) 36.4 C 36.7 C  SpO2: 95% 95%    Last Pain:  Vitals:   08/20/20 0441  TempSrc: Oral  PainSc:                  Precious Haws [name]

## 2020-08-20 NOTE — Progress Notes (Signed)
PT Cancellation Note  Patient Details Name: Andrea [name] MRN: [id number] DOB: 11/23/1945   Cancelled Treatment:    Reason Eval/Treat Not Completed: Medical issues which prohibited therapy. Pt with HGB of 6.2, to get 2 units of blood this date.  Not appropriate for PT at this time, will continue to follow and attempt when appropriate.    Kreg Shropshire, DPT 08/20/2020, 10:09 AM

## 2020-08-20 NOTE — TOC Progression Note (Signed)
Transition of Care [facility]) - Progression Note    Patient Details  Name: REMMIE BEMBENEK MRN: [id number] Date of Birth: 1945-11-10  Transition of Care [facility]) CM/SW [place], RN Phone Number: 08/20/2020, 4:10 PM  Clinical Narrative:   TOC in to see patient and daughters at bedside.  Patient lives at home with spouse, daughters help as necessary.  Patient drives and has no concerns with getting to appointments or getting medications.  Patient takes medications as directed, occasionally daughter helps with new meds or complicated regimens.  Patient is exploring the option of being discharged to a SNF as if she returns home, she will be alone during the daytime hours.  Awaiting PT.OT eval and recommendations.  TOC contact information given, TOC to follow to discharge.    Barriers to Discharge: Continued Medical Work up  Expected Discharge Plan and Services     Discharge Planning Services: CM Consult   Living arrangements for the past 2 months: Single Family Home                                       Social Determinants of Health (SDOH) Interventions    Readmission Risk Interventions No flowsheet data found.

## 2020-08-20 NOTE — Progress Notes (Signed)
OT Cancellation Note  Patient Details Name: ALIZZON DIOGUARDI MRN: [id number] DOB: 11/09/1945   Cancelled Treatment:    Reason Eval/Treat Not Completed: Medical issues which prohibited therapy Pt currently with hgb of 6.2 and to receive 2 units of blood. Will f/u for OT evaluation at later date/time as able/appropriate. Thank you.  Gerrianne Scale, [place], OTR/L ascom [phone] 08/20/20, 2:25 PM

## 2020-08-20 NOTE — Progress Notes (Signed)
Pt has not voided since foley was removed. Bladder scan volume is 321. Dr Rudene Christians notified. New orders received

## 2020-08-20 NOTE — Progress Notes (Signed)
Pt bleeding from the incision site. Dressing reinforced. Dr. Rudene Christians notified at bed side. Will cont to monitor the pt.

## 2020-08-20 NOTE — Plan of Care (Signed)

## 2020-08-21 LAB — GLUCOSE, CAPILLARY
Glucose-Capillary: 105 mg/dL — ABNORMAL HIGH (ref 70–99)
Glucose-Capillary: 150 mg/dL — ABNORMAL HIGH (ref 70–99)
Glucose-Capillary: 112 mg/dL — ABNORMAL HIGH (ref 70–99)
Glucose-Capillary: 133 mg/dL — ABNORMAL HIGH (ref 70–99)

## 2020-08-21 LAB — BPAM RBC
Blood Product Expiration Date: 202206222359
Blood Product Expiration Date: 202206222359
Blood Product Expiration Date: 202206232359
ISSUE DATE / TIME: 202206141449
ISSUE DATE / TIME: 202206150951
ISSUE DATE / TIME: 202206151408
Unit Type and Rh: 600
Unit Type and Rh: 600
Unit Type and Rh: 600

## 2020-08-21 LAB — TYPE AND SCREEN
ABO/RH(D): A POS
Antibody Screen: NEGATIVE
Unit division: 0
Unit division: 0
Unit division: 0

## 2020-08-21 LAB — CBC
HCT: 26 % — ABNORMAL LOW (ref 36.0–46.0)
Hemoglobin: 8.7 g/dL — ABNORMAL LOW (ref 12.0–15.0)
MCH: 29.3 pg (ref 26.0–34.0)
MCHC: 33.5 g/dL (ref 30.0–36.0)
MCV: 87.5 fL (ref 80.0–100.0)
Platelets: 87 10*3/uL — ABNORMAL LOW (ref 150–400)
RBC: 2.97 MIL/uL — ABNORMAL LOW (ref 3.87–5.11)
RDW: 16.7 % — ABNORMAL HIGH (ref 11.5–15.5)
WBC: 9.2 10*3/uL (ref 4.0–10.5)
nRBC: 0 % (ref 0.0–0.2)

## 2020-08-21 LAB — SURGICAL PATHOLOGY

## 2020-08-21 MED ORDER — MAGNESIUM HYDROXIDE 400 MG/5ML PO SUSP
30.0000 mL | Freq: Once | ORAL | Status: AC
  Administered 2020-08-21: 30 mL via ORAL
  Filled 2020-08-21: qty 30

## 2020-08-21 NOTE — Progress Notes (Signed)
This nurse went to check blood sugar on patient prior to supper.Second bag of blood transfusion was still running.Pt c/o pain at IV site on LUA while blood was transfusing. She reported that pain started after having blood pressure was taken on the same arm. IV site was assessed,no ssx of infiltration noted at site.IV cath intact & secured. will cont to monitor.CL within reach.

## 2020-08-21 NOTE — Evaluation (Signed)
Physical Therapy Evaluation Patient Details Name: Andrea [name] MRN: [id number] DOB: 1945/10/05 Today's Date: 08/21/2020   History of Present Illness  75 y/o female s/p L total hip 6/14, had low HGB with 2 unit transfusion POD1.  Recently pt has been very limited with mobility, apparently nearly w/c bound recently.  Clinical Impression  Pt pleasant and showed good effort t/o PT session but ultimately was very pain limited and simply could not get herself to do a lot of mobility or even much AROM exercises with L LE.  She was eager to try but functionally quite limited.  Pt will clearly need rehab when medically ready for d/c.      Follow Up Recommendations SNF;Supervision/Assistance - 24 hour    Equipment Recommendations  None recommended by PT    Recommendations for Other Services       Precautions / Restrictions Precautions Precautions: Anterior Hip Precaution Booklet Issued: Yes (comment) Restrictions Weight Bearing Restrictions: Yes LLE Weight Bearing: Weight bearing as tolerated      Mobility  Bed Mobility Overal bed mobility: Needs Assistance Bed Mobility: Supine to Sit     Supine to sit: Max assist;+2 for physical assistance     General bed mobility comments: pt able to minimally edge R LE to EOB, but pain limited and needing heavy assist to get to EOB    Transfers Overall transfer level: Needs assistance Equipment used: Rolling walker (2 wheeled) Transfers: Sit to/from Stand Sit to Stand: Mod assist;+2 physical assistance         General transfer comment: Pt again very pain limited, poor tolerance despite good effort.  Did not maintain standing >3 seconds on first attempt, after encouragement and cuing she did better on second attempt  Ambulation/Gait             General Gait Details: no true ambulation, was able to drag her feet with heavy assist and good effort from bed to recliner but struggled to do any real steps  Stairs             Wheelchair Mobility    Modified Rankin (Stroke Patients Only)       Balance Overall balance assessment: Needs assistance Sitting-balance support: Single extremity supported Sitting balance-Leahy Scale: Fair Sitting balance - Comments: leaning to the R to unweight L hip   Standing balance support: Bilateral upper extremity supported Standing balance-Leahy Scale: Zero Standing balance comment: heavy +2 assist to maintain upright                             Pertinent Vitals/Pain Pain Assessment: 0-10 Pain Score: 9  Pain Location: L hip    Home Living Family/patient expects to be discharged to:: Private residence Living Arrangements: Spouse/significant other Available Help at Discharge: Family;Available PRN/intermittently;Friend(s) Type of Home: Mobile home Home Access: Ramped entrance;Other (comment)     Home Layout: One level Home Equipment: [place] - 4 wheels;Walker - 2 wheels;Tub bench;Wheelchair - manual;Toilet riser      Prior Function Level of Independence: Needs assistance   Gait / Transfers Assistance Needed: apparently quite limited recently due to hip pain, but can get around in the home with walker           Hand Dominance        Extremity/Trunk Assessment   Upper Extremity Assessment Upper Extremity Assessment: Overall WFL for tasks assessed    Lower Extremity Assessment Lower Extremity Assessment: Generalized weakness (very pain limited tolerance  on the L (strength and ROM))       Communication   Communication: No difficulties  Cognition Arousal/Alertness: Awake/alert Behavior During Therapy: WFL for tasks assessed/performed Overall Cognitive Status: Within Functional Limits for tasks assessed                                        General Comments      Exercises Total Joint Exercises Ankle Circles/Pumps: AROM;AAROM;15 reps Quad Sets: AROM;15 reps Short Arc Quad: AAROM;10 reps Heel Slides: AAROM;5 reps  (poor tolerance 2/2 pain) Hip ABduction/ADduction: PROM;5 reps (highly pain limited)   Assessment/Plan    PT Assessment Patient needs continued PT services  PT Problem List Decreased strength;Decreased activity tolerance;Decreased range of motion;Decreased balance;Decreased mobility;Decreased knowledge of use of DME;Decreased safety awareness;Decreased knowledge of precautions;Cardiopulmonary status limiting activity;Pain       PT Treatment Interventions DME instruction;Stair training;Gait training;Functional mobility training;Therapeutic activities;Therapeutic exercise;Balance training;Neuromuscular re-education;Wheelchair mobility training;Patient/family education    PT Goals (Current goals can be found in the Care Plan section)  Acute Rehab PT Goals Patient Stated Goal: get to where she can do some walking PT Goal Formulation: With patient Time For Goal Achievement: 09/04/20 Potential to Achieve Goals: Fair    Frequency BID   Barriers to discharge        Co-evaluation               AM-PAC PT "6 Clicks" Mobility  Outcome Measure Help needed turning from your back to your side while in a flat bed without using bedrails?: Total Help needed moving from lying on your back to sitting on the side of a flat bed without using bedrails?: Total Help needed moving to and from a bed to a chair (including a wheelchair)?: Total Help needed standing up from a chair using your arms (e.g., wheelchair or bedside chair)?: Total Help needed to walk in hospital room?: Total Help needed climbing 3-5 steps with a railing? : Total 6 Click Score: 6    End of Session Equipment Utilized During Treatment: Gait belt Activity Tolerance: Patient limited by pain Patient left: with chair alarm set;with call bell/phone within reach Nurse Communication: Mobility status PT Visit Diagnosis: Muscle weakness (generalized) (M62.81);Unsteadiness on feet (R26.81);Pain Pain - Right/Left: Left Pain - part of  body: Hip    Time: 2025-4270 PT Time Calculation (min) (ACUTE ONLY): 35 min   Charges:   PT Evaluation $PT Eval Low Complexity: 1 Low PT Treatments $Therapeutic Exercise: 8-22 mins $Therapeutic Activity: 8-22 mins        Kreg Shropshire, DPT 08/21/2020, 12:23 PM

## 2020-08-21 NOTE — Progress Notes (Signed)
Physical Therapy Treatment Patient Details Name: Andrea [name] MRN: [id number] DOB: 11/30/1945 Today's Date: 08/21/2020    History of Present Illness 75 y/o female s/p L total hip 6/14, had low HGB with 2 unit transfusion POD1.  Recently pt has been very limited with mobility, apparently nearly w/c bound recently.    PT Comments    Pt continues to be very pain limited but with some extra time and encouragement showed good effort.  Pt with exquisite pain with even minimal L LE activity but did show increased AROM and tolerance (though still quite limited) with exercises.  Pt continues to require heavy +2 assist during mobility but did manage a take a few shuffle steps this afternoon with heavy cuing and walker reliance.   Follow Up Recommendations  SNF;Supervision/Assistance - 24 hour     Equipment Recommendations  None recommended by PT    Recommendations for Other Services       Precautions / Restrictions Precautions Precautions: Anterior Hip Precaution Booklet Issued: Yes (comment) Restrictions Weight Bearing Restrictions: Yes LLE Weight Bearing: Weight bearing as tolerated    Mobility  Bed Mobility Overal bed mobility: Needs Assistance Bed Mobility: Sit to Supine       Sit to supine: Max assist;+2 for physical assistance   General bed mobility comments: pt to chair pre/post    Transfers Overall transfer level: Needs assistance Equipment used: 1 person hand held assist Transfers: Sit to/from Stand Sit to Stand: Max assist;Total assist         General transfer comment: OT attempted to have pt just clear her bottom from the seat of the recliner with push-to-stand techique using arm rests with MAX A from OT and pt only able to clear ~1/2" (being generous) from the seat  Ambulation/Gait Ambulation/Gait assistance: Max assist Gait Distance (Feet): 2 Feet Assistive device: Rolling walker (2 wheeled)       General Gait Details: again difficult call the effort  true ambulation, yet she was able to partially unweight each foot to slide/step recliner to bed and then even a few small side "steps" along EOB   Stairs             Wheelchair Mobility    Modified Rankin (Stroke Patients Only)       Balance Overall balance assessment: Needs assistance Sitting-balance support: Single extremity supported Sitting balance-Leahy Scale: Fair Sitting balance - Comments: leaning to the R to unweight L hip   Standing balance support: Bilateral upper extremity supported Standing balance-Leahy Scale: Zero Standing balance comment: heavy +2 assist to maintain upright                            Cognition Arousal/Alertness: Awake/alert Behavior During Therapy: WFL for tasks assessed/performed Overall Cognitive Status: Within Functional Limits for tasks assessed                                        Exercises Total Joint Exercises Ankle Circles/Pumps: AROM;15 reps Quad Sets: AROM;10 reps Gluteal Sets: AROM;10 reps Short Arc Quad: AAROM;10 reps Heel Slides: AAROM;5 reps Hip ABduction/ADduction: AAROM;10 reps    General Comments        Pertinent Vitals/Pain Pain Assessment: 0-10 Pain Score: 9  Pain Location: L hip Pain Descriptors / Indicators: Nagging;Tender;Grimacing;Guarding Pain Intervention(s): Limited activity within patient's tolerance;Monitored during session;Repositioned    Home Living Family/patient  expects to be discharged to:: Private residence Living Arrangements: Spouse/significant other Available Help at Discharge: Family;Available PRN/intermittently;Friend(s) Type of Home: Mobile home Home Access: Ramped entrance;Other (comment)   Home Layout: One level Home Equipment: [place] - 4 wheels;Walker - 2 wheels;Tub bench;Wheelchair - manual;Toilet riser      Prior Function Level of Independence: Needs assistance  Gait / Transfers Assistance Needed: apparently quite limited recently due to hip  pain, but can get around in the home with walker ADL's / Homemaking Assistance Needed: cannot get w/c close enough to commode, has to use UE support on sink to get to toilet. States she can bathe and dress herself. Family assist for IADLs.     PT Goals (current goals can now be found in the care plan section) Progress towards PT goals: Progressing toward goals    Frequency    BID      PT Plan Current plan remains appropriate    Co-evaluation              AM-PAC PT "6 Clicks" Mobility   Outcome Measure  Help needed turning from your back to your side while in a flat bed without using bedrails?: Total Help needed moving from lying on your back to sitting on the side of a flat bed without using bedrails?: Total Help needed moving to and from a bed to a chair (including a wheelchair)?: Total Help needed standing up from a chair using your arms (e.g., wheelchair or bedside chair)?: Total Help needed to walk in hospital room?: Total Help needed climbing 3-5 steps with a railing? : Total 6 Click Score: 6    End of Session Equipment Utilized During Treatment: Gait belt Activity Tolerance: Patient limited by pain Patient left: with chair alarm set;with call bell/phone within reach Nurse Communication: Mobility status PT Visit Diagnosis: Muscle weakness (generalized) (M62.81);Unsteadiness on feet (R26.81);Pain Pain - Right/Left: Left Pain - part of body: Hip     Time: 9794-8016 PT Time Calculation (min) (ACUTE ONLY): 29 min  Charges:  $Therapeutic Exercise: 8-22 mins $Therapeutic Activity: 8-22 mins                     Kreg Shropshire, DPT 08/21/2020, 5:31 PM

## 2020-08-21 NOTE — Progress Notes (Signed)
Subjective: 2 Days Post-Op Procedure(s) (LRB): HARDWARE REMOVAL (Left) TOTAL HIP ARTHROPLASTY ANTERIOR APPROACH (Left) Patient reports pain as mild.   Patient is well, and has had no acute complaints or problems Denies any CP, SOB, ABD pain. We will continue therapy today.  Plan is to go Skilled nursing facility after hospital stay.  Objective: Vital signs in last 24 hours: Temp:  [97.6 F (36.4 C)-98.7 F (37.1 C)] 98.5 F (36.9 C) (06/16 0739) Pulse Rate:  [77-93] 90 (06/16 0739) Resp:  [15-20] 15 (06/16 0739) BP: (86-129)/(40-55) 127/55 (06/16 0739) SpO2:  [88 %-100 %] 88 % (06/16 0739)  Intake/Output from previous day: 06/15 0701 - 06/16 0700 In: 2487 [I.V.:1783; Blood:704] Out: 1450 [Urine:1450] Intake/Output this shift: No intake/output data recorded.  Recent Labs    08/19/20 1407 08/20/20 0409 08/20/20 1756 08/21/20 0420  HGB 6.3* 6.2* 10.0* 8.7*   Recent Labs    08/20/20 0409 08/20/20 1756 08/21/20 0420  WBC 10.0  --  9.2  RBC 2.10*  --  2.97*  HCT 19.1* 29.4* 26.0*  PLT 99*  --  87*   Recent Labs    08/19/20 1407 08/20/20 0409  NA  --  139  K  --  4.8  CL  --  114*  CO2  --  20*  BUN  --  33*  CREATININE 1.16* 1.30*  GLUCOSE  --  192*  CALCIUM  --  7.7*   No results for input(s): LABPT, INR in the last 72 hours.  EXAM General - Patient is Alert, Appropriate, and Oriented Extremity - Neurovascular intact Sensation intact distally Intact pulses distally Dorsiflexion/Plantar flexion intact No cellulitis present Compartment soft Dressing - scant drainage and Praveena is intact with no drainage in canister but having some proximal posterior dressing drainage that is reinforced with foam tape. Motor Function - intact, moving foot and toes well on exam.   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   Depression    Diabetes mellitus type 2, uncomplicated ([place])    Diabetic retinopathy ([place])    Dupuytren contracture 2011   s/p surgery to LEFT hand   Frequent urinary tract infections    GERD (gastroesophageal reflux disease)    Grade I diastolic dysfunction    HTN (hypertension)    Hyperlipidemia, unspecified    Irritable bowel syndrome    Meningitis 11/2019   MI (myocardial infarction) ([place]) 1994   no stent   Neuromuscular disorder (HCC)    nerve pain in back and legs   Osteoporosis    Pneumonia 2021   RBBB    Sepsis ([place]) 11/2019   Skin cancer    TIA (transient ischemic attack) 2014   Vitamin D deficiency, unspecified    Wheezing     Assessment/Plan:   2 Days Post-Op Procedure(s) (LRB): HARDWARE REMOVAL (Left) TOTAL HIP ARTHROPLASTY ANTERIOR APPROACH (Left) Active Problems:   Status post total hip replacement, left  Estimated body mass index is 32.25 kg/m as calculated from the following:   Height as of this encounter: 4\' 9"  (1.448 m).   Weight as of this encounter: 67.6 kg. Advance diet Up with therapy Acute post op blood loss anemia -hemoglobin  8.7, s/p 2 units of PRBC 08/20/20.  Continue with iron supplement. Vital signs stable.   Pain controlled Recheck labs in the morning Care management to assist with discharge to skilled nursing facility  DVT Prophylaxis - Lovenox, TED hose, and SCDs left Weight-Bearing as tolerated to left leg   T. Rachelle Hora, PA-C [place] 08/21/2020, 8:32 AM

## 2020-08-21 NOTE — Evaluation (Signed)
Occupational Therapy Evaluation Patient Details Name: Andrea [name] MRN: [id number] DOB: [birth year] Today's Date: 08/21/2020    History of Present Illness 75 y/o female s/p L total hip 6/14, had low HGB with 2 unit transfusion POD1.  Recently pt has been very limited with mobility, apparently nearly w/c bound recently.   Clinical Impression   Pt seen for OT evaluation this date in setting of acute hospitalization s/p L total hip. Pt presents this date largely pain limited. She states that even before surgery, she has been pain limited in recent months to the point of primarily using w/c for fxl mobility. Pt has assistance from her spouse at home but states she can typically perform her basic self care I'ly and transfer herself into/out of w/c w/o assistance. On OT assessment this date, pt requires: MIN A for seated UB ADLs d/t limited ability to tolerate changing truncal position to complete the task 2/2 to it's impact on the Lhip to make any sort of positional adjustment. MAX/TOTAL A for all LB ADLs at this time. Makes good effort to contribute to R LE (nonop) LB ADLs, but still experiencing limitations r/t pain in L hip. OT educates pt re: compression stocking mgt, fall prevention, shower considerations, LB dressing with AE, importance of practincing CTS with use of tricep strength to offset pressure to L LE. Pt with good understanding, remains largely pain limited. Anticipate, based on today's performance and pt's current limitations d/t pain that she will require f/u OT services in STR setting to improve safety and tolerance with ADLs.     Follow Up Recommendations  SNF    Equipment Recommendations  Other (comment) (defer)    Recommendations for Other Services       Precautions / Restrictions Precautions Precautions: Anterior Hip Precaution Booklet Issued: Yes (comment) Restrictions Weight Bearing Restrictions: Yes LLE Weight Bearing: Weight bearing as tolerated      Mobility  Bed Mobility Overal bed mobility: Needs Assistance Bed Mobility: Sit to Supine       Sit to supine: Max assist;+2 for physical assistance   General bed mobility comments: pt to chair pre/post    Transfers Overall transfer level: Needs assistance Equipment used: 1 person hand held assist Transfers: Sit to/from Stand Sit to Stand: Max assist;Total assist         General transfer comment: OT attempted to have pt just clear her bottom from the seat of the recliner with push-to-stand techique using arm rests with MAX A from OT and pt only able to clear ~1/2" (being generous) from the seat    Balance Overall balance assessment: Needs assistance Sitting-balance support: Single extremity supported Sitting balance-Leahy Scale: Fair Sitting balance - Comments: leaning to the R to unweight L hip   Standing balance support: Bilateral upper extremity supported Standing balance-Leahy Scale: Zero Standing balance comment: heavy +2 assist to maintain upright                           ADL either performed or assessed with clinical judgement   ADL Overall ADL's : Needs assistance/impaired                                       General ADL Comments: MIN A for seated UB ADLs d/t limited ability to tolerate changing truncal position to complete the task 2/2 to it's impact on the Lhip  to make any sort of positional adjustment. MAX/TOTAL A for all LB ADLs at this time. Makes good effort to contribute to R LE (nonop) LB ADLs, but still experiencing limitations r/t pain in L hip.     Vision Patient Visual Report: No change from baseline       Perception     Praxis      Pertinent Vitals/Pain Pain Assessment: 0-10 Pain Score: 9  Pain Location: L hip Pain Descriptors / Indicators: Nagging;Tender;Grimacing;Guarding Pain Intervention(s): Limited activity within patient's tolerance;Monitored during session;Repositioned     Hand Dominance Right   Extremity/Trunk  Assessment Upper Extremity Assessment Upper Extremity Assessment: Overall WFL for tasks assessed;Generalized weakness (ROM WFL, MMT grossly 4-/5)   Lower Extremity Assessment Lower Extremity Assessment: Generalized weakness;LLE deficits/detail LLE Deficits / Details: decreased tolerance for L LE ROM including expected limitations with hip and knee flex/ext; impacting ADLs       Communication Communication Communication: No difficulties   Cognition Arousal/Alertness: Awake/alert Behavior During Therapy: WFL for tasks assessed/performed Overall Cognitive Status: Within Functional Limits for tasks assessed                                     General Comments       Exercises  Other Exercises: OT educates pt re: compression stocking mgt, fall prevention, shower considerations, LB dressing with AE, importance of practincing CTS with use of tricep strength to offset pressure to L LE. Pt with good understanding, remains largely pain limited.   Shoulder Instructions      Home Living Family/patient expects to be discharged to:: Private residence Living Arrangements: Spouse/significant other Available Help at Discharge: Family;Available PRN/intermittently;Friend(s) Type of Home: Mobile home Home Access: Ramped entrance;Other (comment)     Home Layout: One level     Bathroom Shower/Tub: Teacher, early years/pre: Standard Bathroom Accessibility: Yes   Home Equipment: [place] - 4 wheels;Walker - 2 wheels;Tub bench;Wheelchair - manual;Toilet riser          Prior Functioning/Environment Level of Independence: Needs assistance  Gait / Transfers Assistance Needed: apparently quite limited recently due to hip pain, but can get around in the home with walker ADL's / Homemaking Assistance Needed: cannot get w/c close enough to commode, has to use UE support on sink to get to toilet. States she can bathe and dress herself. Family assist for IADLs.             OT Problem List: Decreased strength;Decreased range of motion;Decreased activity tolerance;Impaired balance (sitting and/or standing);Decreased knowledge of use of DME or AE;Pain      OT Treatment/Interventions: Self-care/ADL training;DME and/or AE instruction;Therapeutic activities;Balance training;Therapeutic exercise;Energy conservation;Patient/family education    OT Goals(Current goals can be found in the care plan section) Acute Rehab OT Goals Patient Stated Goal: get to where she can do some walking OT Goal Formulation: With patient Time For Goal Achievement: 09/04/20 Potential to Achieve Goals: Fair ADL Goals Pt Will Perform Grooming: with max assist;standing (with LRAD to complete 1 g/h task to increase standing tolerance) Pt Will Perform Upper Body Dressing: Independently;sitting Pt Will Perform Lower Body Dressing: with mod assist;with max assist;sitting/lateral leans;with adaptive equipment Pt Will Transfer to Toilet: with max assist;stand pivot transfer;bedside commode Pt Will Perform Toileting - Clothing Manipulation and hygiene: with max assist;sit to/from stand Pt/caregiver will Perform Home Exercise Program: Increased strength;Both right and left upper extremity;With Supervision  OT Frequency: Min 2X/week  Barriers to D/C:            Co-evaluation              AM-PAC OT "6 Clicks" Daily Activity     Outcome Measure Help from another person eating meals?: None Help from another person taking care of personal grooming?: A Little Help from another person toileting, which includes using toliet, bedpan, or urinal?: A Lot Help from another person bathing (including washing, rinsing, drying)?: A Lot Help from another person to put on and taking off regular upper body clothing?: A Little Help from another person to put on and taking off regular lower body clothing?: Total 6 Click Score: 15   End of Session    Activity Tolerance: Patient limited by pain Patient  left: in chair;with call bell/phone within reach;with chair alarm set  OT Visit Diagnosis: Unsteadiness on feet (R26.81);Other abnormalities of gait and mobility (R26.89);Muscle weakness (generalized) (M62.81);Pain Pain - Right/Left: Left Pain - part of body: Hip                Time: 2458-0998 OT Time Calculation (min): 42 min Charges:  OT General Charges $OT Visit: 1 Visit OT Evaluation $OT Eval Moderate Complexity: 1 Mod OT Treatments $Self Care/Home Management : 8-22 mins $Therapeutic Activity: 8-22 mins  Gerrianne Scale, MS, OTR/L ascom [phone] 08/21/20, 5:45 PM

## 2020-08-22 LAB — CBC
HCT: 26.6 % — ABNORMAL LOW (ref 36.0–46.0)
Hemoglobin: 9.2 g/dL — ABNORMAL LOW (ref 12.0–15.0)
MCH: 30.3 pg (ref 26.0–34.0)
MCHC: 34.6 g/dL (ref 30.0–36.0)
MCV: 87.5 fL (ref 80.0–100.0)
Platelets: 94 10*3/uL — ABNORMAL LOW (ref 150–400)
RBC: 3.04 MIL/uL — ABNORMAL LOW (ref 3.87–5.11)
RDW: 16.4 % — ABNORMAL HIGH (ref 11.5–15.5)
WBC: 10 10*3/uL (ref 4.0–10.5)
nRBC: 0 % (ref 0.0–0.2)

## 2020-08-22 LAB — GLUCOSE, CAPILLARY
Glucose-Capillary: 62 mg/dL — ABNORMAL LOW (ref 70–99)
Glucose-Capillary: 84 mg/dL (ref 70–99)
Glucose-Capillary: 101 mg/dL — ABNORMAL HIGH (ref 70–99)
Glucose-Capillary: 149 mg/dL — ABNORMAL HIGH (ref 70–99)
Glucose-Capillary: 177 mg/dL — ABNORMAL HIGH (ref 70–99)

## 2020-08-22 MED ORDER — TRAMADOL HCL 50 MG PO TABS: 50.0000 mg | ORAL_TABLET | Freq: Four times a day (QID) | ORAL | 0 refills | Status: DC | PRN

## 2020-08-22 MED ORDER — MAGNESIUM HYDROXIDE 400 MG/5ML PO SUSP
30.0000 mL | Freq: Once | ORAL | Status: AC
  Administered 2020-08-22: 30 mL via ORAL
  Filled 2020-08-22: qty 30

## 2020-08-22 MED ORDER — DOCUSATE SODIUM 100 MG PO CAPS: 100.0000 mg | ORAL_CAPSULE | Freq: Two times a day (BID) | ORAL | 0 refills | Status: DC

## 2020-08-22 MED ORDER — OXYCODONE HCL 5 MG PO TABS: 5.0000 mg | ORAL_TABLET | ORAL | 0 refills | Status: DC | PRN

## 2020-08-22 MED ORDER — ENOXAPARIN SODIUM 40 MG/0.4ML IJ SOSY: 40.0000 mg | PREFILLED_SYRINGE | INTRAMUSCULAR | 0 refills | Status: DC

## 2020-08-22 NOTE — NC FL2 (Signed)
[place] LEVEL OF CARE SCREENING TOOL     IDENTIFICATION  Patient Name: Andrea [name] Birthdate: 1945-03-25 Sex: female Admission Date (Current Location): 08/19/2020  [facility] and [place] Number:  Engineering geologist and Address:  [facility], [address], [place],  [address]      Provider Number: [id number]  Attending Physician Name and Address:  Hessie Knows, MD  Relative Name and Phone Number:  Darlina Sicilian (Daughter)   [phone] [facility] [place] Phone)    Current Level of Care: Hospital Recommended Level of Care: [place] Prior Approval Number:    Date Approved/Denied:   PASRR Number: [id number]  Discharge Plan: SNF    Current Diagnoses: Patient Active Problem List   Diagnosis Date Noted   Status post total hip replacement, 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   Pain due to onychomycosis of toenail of left foot 03/06/2020   Malaise and fatigue 12/28/2019   UTI (urinary tract infection) 12/28/2019   Hyponatremia 12/27/2019   Intertrigo 12/27/2019   Acute respiratory failure with hypoxia ([place]) 11/23/2019   History of meningitis 11/23/2019   CSF leak from nose 11/22/2019   PAD (peripheral artery disease) ([place]) 12/28/2018   Moderate episode of recurrent major depressive disorder ([place]) 12/19/2017   Leg length discrepancy 09/21/2017   Coronary artery disease involving native coronary artery 01/06/2017   Drug-induced constipation 01/06/2017   Spinal stenosis of lumbar region 01/06/2017   Spondylolisthesis of lumbar region 12/30/2016   Obese 04/16/2015   History of colon polyps 11/23/2012   Bundle branch block, 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   Personal history of transient ischemic attack (TIA), and cerebral infarction without residual deficits 11/23/2012   Encounter for long-term (current) use of aspirin 11/23/2012   Status post percutaneous transluminal coronary angioplasty 11/23/2012   Transient ischemic attack (TIA), and cerebral  infarction without residual deficits(V12.54) 11/23/2012   Anemia 08/17/2012   Chronic obstructive pulmonary disease ([place]) 08/17/2012   HLD (hyperlipidemia) 08/17/2012   OP (osteoporosis) 08/17/2012   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 edema, with long-term current use of insulin ([place]) 08/17/2012   Ischemic heart disease due to coronary artery obstruction ([place]) 07/24/2012    Orientation RESPIRATION BLADDER Height & Weight     Self, Time, Situation, Place  Normal Continent Weight: 67.6 kg Height:  4\' 9"  (144.8 cm)  BEHAVIORAL SYMPTOMS/MOOD NEUROLOGICAL BOWEL NUTRITION STATUS      Continent Diet (Carb Modified)  AMBULATORY STATUS COMMUNICATION OF NEEDS Skin   Extensive Assist Verbally Skin abrasions, Wound Vac, Surgical wounds (Negative pressure wound from surgery L hip with dressing, abrasions to BLE)                       Personal Care Assistance Level of Assistance  Bathing, Feeding, Dressing Bathing Assistance: Limited assistance Feeding assistance: Independent Dressing Assistance: Limited assistance     Functional Limitations Info  Sight, Hearing, Speech Sight Info: Adequate Hearing Info: Adequate Speech Info: Adequate    SPECIAL CARE FACTORS FREQUENCY  PT (By licensed PT), OT (By licensed OT)     PT Frequency: Min 5x weekly OT Frequency: Min 5x weekly            Contractures Contractures Info: Not present    Additional Factors Info  Code Status, Allergies Code Status Info: FULL Code Allergies Info: Metformin and Related; Sulfa Antibiotics           Current Medications (08/22/2020):  This is the  current hospital active medication list Current Facility-Administered Medications  Medication Dose Route Frequency Provider Last Rate Last Admin   0.9 %  sodium chloride infusion   Intravenous Continuous Hessie Knows, MD   Stopped at 08/22/20 [phone]   acetaminophen (TYLENOL) tablet 325-650 mg   325-650 mg Oral Q6H PRN Hessie Knows, MD       alum & mag hydroxide-simeth (MAALOX/MYLANTA) 200-200-20 MG/5ML suspension 30 mL  30 mL Oral Q4H PRN Hessie Knows, MD       atorvastatin (LIPITOR) tablet 80 mg  80 mg Oral QHS Hessie Knows, MD   80 mg at 08/21/20 2142   bisacodyl (DULCOLAX) suppository 10 mg  10 mg Rectal Daily PRN Hessie Knows, MD       diphenhydrAMINE (BENADRYL) 12.5 MG/5ML elixir 12.5-25 mg  12.5-25 mg Oral Q4H PRN Hessie Knows, MD       docusate sodium (COLACE) capsule 100 mg  100 mg Oral BID Hessie Knows, MD   100 mg at 08/22/20 0856   enoxaparin (LOVENOX) injection 40 mg  40 mg Subcutaneous Q24H Hessie Knows, MD   40 mg at 08/22/20 0854   ferrous [id number] C-folic acid (TRINSICON / FOLTRIN) capsule 1 capsule  1 capsule Oral BID Duanne Guess, PA-C   1 capsule at 08/22/20 0855   furosemide (LASIX) tablet 20 mg  20 mg Oral Daily Hessie Knows, MD   20 mg at 08/22/20 0856   HYDROmorphone (DILAUDID) injection 0.5-1 mg  0.5-1 mg Intravenous Q4H PRN Hessie Knows, MD       insulin aspart (novoLOG) injection 0-15 Units  0-15 Units Subcutaneous TID WC Hessie Knows, MD   2 Units at 08/21/20 1216   insulin glargine (LANTUS) injection 65 Units  65 Units Subcutaneous Daily Hessie Knows, MD   65 Units at 08/22/20 0854   losartan (COZAAR) tablet 25 mg  25 mg Oral Daily Duanne Guess, PA-C   25 mg at 08/22/20 0855   magnesium citrate solution 1 Bottle  1 Bottle Oral Once PRN Hessie Knows, MD       menthol-cetylpyridinium (CEPACOL) lozenge 3 mg  1 lozenge Oral PRN Hessie Knows, MD       Or   phenol (CHLORASEPTIC) mouth spray 1 spray  1 spray Mouth/Throat PRN Hessie Knows, MD       methocarbamol (ROBAXIN) tablet 500 mg  500 mg Oral Q6H PRN Hessie Knows, MD   500 mg at 08/20/20 2057   Or   methocarbamol (ROBAXIN) 500 mg in dextrose 5 % 50 mL IVPB  500 mg Intravenous Q6H PRN Hessie Knows, MD       metoCLOPramide (REGLAN) tablet 5-10 mg  5-10 mg Oral Q8H PRN Hessie Knows, MD       Or   metoCLOPramide (REGLAN) injection 5-10 mg  5-10 mg Intravenous Q8H PRN Hessie Knows, MD       metoprolol succinate (TOPROL-XL) 24 hr tablet 25 mg  25 mg Oral Daily Hessie Knows, MD   25 mg at 08/22/20 0856   multivitamin-lutein (OCUVITE-LUTEIN) capsule 1 capsule  1 capsule Oral Daily Hessie Knows, MD   1 capsule at 08/22/20 0854   ondansetron (ZOFRAN) tablet 4 mg  4 mg Oral Q6H PRN Hessie Knows, MD       Or   ondansetron [facility]) injection 4 mg  4 mg Intravenous Q6H PRN Hessie Knows, MD       oxybutynin (DITROPAN-XL) 24 hr tablet 10 mg  10 mg Oral Daily Hessie Knows, MD  10 mg at 08/22/20 0855   oxyCODONE (Oxy IR/ROXICODONE) immediate release tablet 10-15 mg  10-15 mg Oral Q4H PRN Hessie Knows, MD   15 mg at 08/21/20 2142   oxyCODONE (Oxy IR/ROXICODONE) immediate release tablet 5-10 mg  5-10 mg Oral Q4H PRN Hessie Knows, MD   10 mg at 08/22/20 0855   pantoprazole (PROTONIX) EC tablet 80 mg  80 mg Oral Daily Hessie Knows, MD   80 mg at 08/22/20 0855   polyethylene glycol (MIRALAX / GLYCOLAX) packet 17 g  17 g Oral Daily PRN Hessie Knows, MD       potassium chloride (KLOR-CON) CR tablet 10 mEq  10 mEq Oral Daily Hessie Knows, MD   10 mEq at 08/22/20 0855   traMADol (ULTRAM) tablet 50 mg  50 mg Oral Q6H Hessie Knows, MD   50 mg at 08/22/20 0100   vitamin B-12 (CYANOCOBALAMIN) tablet 1,000 mcg  1,000 mcg Oral Daily Hessie Knows, MD   1,000 mcg at 08/22/20 0902   zolpidem (AMBIEN) tablet 5 mg  5 mg Oral QHS PRN Hessie Knows, MD   5 mg at 08/20/20 2056     Discharge Medications: Please see discharge summary for a list of discharge medications.  Relevant Imaging Results:  Relevant Lab Results:   Additional Information SSN [id number]  Pete Pelt, RN

## 2020-08-22 NOTE — Progress Notes (Signed)
Pt seeing a man out her window through to the otherside. Per [employer], very tearful. I feel like the oxycodone is becoming to much, she seems very "messed up" and all over the place. Crying-mad-fearful. Will pass this on to nightshift. Bp has also been getting softer. She will not follow directions to get up and starts yelling at everyone. She tried sitting down so we just pivoted her to the bed

## 2020-08-22 NOTE — Progress Notes (Signed)
Physical Therapy Treatment Patient Details Name: Andrea [name] MRN: [id number] DOB: 05-24-1945 Today's Date: 08/22/2020    History of Present Illness 75 y/o female s/p L total hip 6/14, had low HGB with 2 unit transfusion POD1.  Recently pt has been very limited with mobility, apparently nearly w/c bound recently. PMH includes CAD, HF, COPD, DM type 2, MI, and TIA.    PT Comments    Pt was pleasant and motivated to participate during the session. Pt's HR and SPO2 WNL throughout session on room air. Pt still very limited by pain and requires mod-max assistance +2 for most functional mobility. Pt is making progress towards goals. Pt will benefit from PT services in a SNF setting upon discharge to safely address deficits listed in patient problem list for decreased caregiver assistance and eventual return to PLOF.   Follow Up Recommendations  SNF;Supervision/Assistance - 24 hour     Equipment Recommendations  None recommended by PT    Recommendations for Other Services       Precautions / Restrictions Precautions Precautions: Anterior Hip Precaution Booklet Issued: Yes (comment) Restrictions Weight Bearing Restrictions: Yes LLE Weight Bearing: Weight bearing as tolerated    Mobility  Bed Mobility Overal bed mobility: Needs Assistance       Supine to sit: Max assist (for BLE and trunk control)          Transfers Overall transfer level: Needs assistance Equipment used: Rolling walker (2 wheeled) Transfers: Sit to/from Stand Sit to Stand: Mod assist;+2 physical assistance         General transfer comment: Pt demonstrated stress incontinence during transfers.  Ambulation/Gait Ambulation/Gait assistance: Min assist;+2 physical assistance Gait Distance (Feet): 4 Feet Assistive device: Rolling walker (2 wheeled)   Gait velocity: decreased   General Gait Details: Pt very slowly side stepped and turned to recliner chair requiring blocking of walker and mod verbal  cues to keep walker close, for sequencing, and keeping upright posture.Pt limited secondary to pain.   Stairs             Wheelchair Mobility    Modified Rankin (Stroke Patients Only)       Balance Overall balance assessment: Needs assistance Sitting-balance support: Single extremity supported Sitting balance-Leahy Scale: Fair     Standing balance support: Bilateral upper extremity supported Standing balance-Leahy Scale: Zero                              Cognition Arousal/Alertness: Awake/alert Behavior During Therapy: WFL for tasks assessed/performed Overall Cognitive Status: Within Functional Limits for tasks assessed                                        Exercises Total Joint Exercises Quad Sets: AROM;Both;10 reps;Supine Hip ABduction/ADduction: AAROM;Strengthening;Both;10 reps;Supine Straight Leg Raises: AROM;AAROM;Strengthening;Both;10 reps;Supine    General Comments        Pertinent Vitals/Pain Pain Assessment: 0-10 Pain Score: 6  Pain Location: 6/10 left hip and 8/10 right hip Pain Descriptors / Indicators: Aching;Sore Pain Intervention(s): Limited activity within patient's tolerance;Monitored during session    Home Living                      Prior Function            PT Goals (current goals can now be found in the care plan  section) Progress towards PT goals: Progressing toward goals    Frequency    BID      PT Plan Current plan remains appropriate    Co-evaluation              AM-PAC PT "6 Clicks" Mobility   Outcome Measure  Help needed turning from your back to your side while in a flat bed without using bedrails?: Total Help needed moving from lying on your back to sitting on the side of a flat bed without using bedrails?: Total Help needed moving to and from a bed to a chair (including a wheelchair)?: Total Help needed standing up from a chair using your arms (e.g., wheelchair or  bedside chair)?: Total Help needed to walk in hospital room?: Total Help needed climbing 3-5 steps with a railing? : Total 6 Click Score: 6    End of Session Equipment Utilized During Treatment: Gait belt Activity Tolerance: Patient limited by pain Patient left: in chair;with call bell/phone within reach;with chair alarm set;with nursing/sitter in room;Other (comment) (Bilateral floating heels reclined in chair) Nurse Communication: Mobility status PT Visit Diagnosis: Muscle weakness (generalized) (M62.81);Unsteadiness on feet (R26.81);Pain Pain - Right/Left: Left Pain - part of body: Hip     Time: 1117-3567  PT Time Calculation (min) (ACUTE ONLY): 42 min    Charges:                        Dayton Scrape SPT 08/22/20, 5:21 PM

## 2020-08-22 NOTE — Progress Notes (Signed)
IV access was lost and 2 nurses were unable to establish a new access. IV team consult placed.

## 2020-08-22 NOTE — Progress Notes (Signed)
Subjective: 3 Days Post-Op Procedure(s) (LRB): HARDWARE REMOVAL (Left) TOTAL HIP ARTHROPLASTY ANTERIOR APPROACH (Left) Patient reports pain as mild.   Patient is well, and has had no acute complaints or problems Denies any CP, SOB, ABD pain. We will continue therapy today.  Plan is to go Skilled nursing facility after hospital stay.  Objective: Vital signs in last 24 hours: Temp:  [97.5 F (36.4 C)-98.5 F (36.9 C)] 97.5 F (36.4 C) (06/17 0427) Pulse Rate:  [73-90] 73 (06/17 0427) Resp:  [15-20] 20 (06/17 0427) BP: (101-127)/(42-55) 109/45 (06/17 0427) SpO2:  [88 %-100 %] 91 % (06/17 0427)  Intake/Output from previous day: 06/16 0701 - 06/17 0700 In: 240 [P.O.:240] Out: 450 [Urine:450] Intake/Output this shift: No intake/output data recorded.  Recent Labs    08/19/20 1407 08/20/20 0409 08/20/20 1756 08/21/20 0420 08/22/20 0400  HGB 6.3* 6.2* 10.0* 8.7* 9.2*   Recent Labs    08/21/20 0420 08/22/20 0400  WBC 9.2 10.0  RBC 2.97* 3.04*  HCT 26.0* 26.6*  PLT 87* 94*   Recent Labs    08/19/20 1407 08/20/20 0409  NA  --  139  K  --  4.8  CL  --  114*  CO2  --  20*  BUN  --  33*  CREATININE 1.16* 1.30*  GLUCOSE  --  192*  CALCIUM  --  7.7*   No results for input(s): LABPT, INR in the last 72 hours.  EXAM General - Patient is Alert, Appropriate, and Oriented Extremity - Neurovascular intact Sensation intact distally Intact pulses distally Dorsiflexion/Plantar flexion intact No cellulitis present Compartment soft Dressing - scant drainage and Praveena is intact with no drainage in canister but having some proximal posterior dressing drainage that is reinforced with foam tape. Motor Function - intact, moving foot and toes well on exam.   Past Medical History:  Diagnosis Date   Anxiety    Arthritis    CAD (coronary artery disease)    Chickenpox    Chronic systolic heart failure (HCC)    COPD (chronic obstructive pulmonary disease) (HCC)     mild-no inhalers   Coronary artery disease    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   Depression    Diabetes mellitus type 2, uncomplicated ([place])    Diabetic retinopathy ([place])    Dupuytren contracture 2011   s/p surgery to LEFT hand   Frequent urinary tract infections    GERD (gastroesophageal reflux disease)    Grade I diastolic dysfunction    HTN (hypertension)    Hyperlipidemia, unspecified    Irritable bowel syndrome    Meningitis 11/2019   MI (myocardial infarction) ([place]) 1994   no stent   Neuromuscular disorder (HCC)    nerve pain in back and legs   Osteoporosis    Pneumonia 2021   RBBB    Sepsis ([place]) 11/2019   Skin cancer    TIA (transient ischemic attack) 2014   Vitamin D deficiency, unspecified    Wheezing     Assessment/Plan:   3 Days Post-Op Procedure(s) (LRB): HARDWARE REMOVAL (Left) TOTAL HIP ARTHROPLASTY ANTERIOR APPROACH (Left) Active Problems:   Status post total hip replacement, left  Estimated body mass index is 32.25 kg/m as calculated from the following:   Height as of this encounter: 4\' 9"  (1.448 m).   Weight as of this encounter: 67.6 kg. Advance diet Up with therapy Acute post op blood loss anemia -hemoglobin 9.2, s/p 2 units of PRBC 08/20/20.  Continue with  iron supplement. Vital signs stable.   Pain controlled Recheck labs in the morning Work on [place] management to assist with discharge to skilled nursing facility  DVT Prophylaxis - Lovenox, TED hose, and SCDs left Weight-Bearing as tolerated to left leg   T. Rachelle Hora, PA-C [place] 08/22/2020, 7:22 AM

## 2020-08-22 NOTE — Discharge Summary (Addendum)
Physician Discharge Summary  Patient ID: Andrea [name] MRN: [id number] DOB/AGE: 13-Jan-1946 75 y.o.  Admit date: 08/19/2020 Discharge date: 08/27/2020  Admission Diagnoses:  Status post total hip replacement, left [Z96.642]   Discharge Diagnoses: Patient Active Problem List   Diagnosis Date Noted   Status post total hip replacement, 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   Pain due to onychomycosis of toenail of left foot 03/06/2020   Malaise and fatigue 12/28/2019   UTI (urinary tract infection) 12/28/2019   Hyponatremia 12/27/2019   Intertrigo 12/27/2019   Acute respiratory failure with hypoxia ([place]) 11/23/2019   History of meningitis 11/23/2019   CSF leak from nose 11/22/2019   PAD (peripheral artery disease) ([place]) 12/28/2018   Moderate episode of recurrent major depressive disorder ([place]) 12/19/2017   Leg length discrepancy 09/21/2017   Coronary artery disease involving native coronary artery 01/06/2017   Drug-induced constipation 01/06/2017   Spinal stenosis of lumbar region 01/06/2017   Spondylolisthesis of lumbar region 12/30/2016   Obese 04/16/2015   History of colon polyps 11/23/2012   Bundle branch block, 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   Personal history of transient ischemic attack (TIA), and cerebral infarction without residual deficits 11/23/2012   Encounter for long-term (current) use of aspirin 11/23/2012   Status post percutaneous transluminal coronary angioplasty 11/23/2012   Transient ischemic attack (TIA), and cerebral infarction without residual deficits(V12.54) 11/23/2012   Anemia 08/17/2012   Chronic obstructive pulmonary disease ([place]) 08/17/2012   HLD (hyperlipidemia) 08/17/2012   OP (osteoporosis) 08/17/2012   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 edema, with long-term current use of insulin ([place]) 08/17/2012   Ischemic heart disease due to coronary artery obstruction ([place]) 07/24/2012    Past Medical History:  Diagnosis  Date   Anxiety    Arthritis    CAD (coronary artery disease)    Chickenpox    Chronic systolic heart failure (HCC)    COPD (chronic obstructive pulmonary disease) ([place])    mild-no inhalers   Coronary artery disease    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   Depression    Diabetes mellitus type 2, uncomplicated ([place])    Diabetic retinopathy ([place])    Dupuytren contracture 2011   s/p surgery to LEFT hand   Frequent urinary tract infections    GERD (gastroesophageal reflux disease)    Grade I diastolic dysfunction    HTN (hypertension)    Hyperlipidemia, unspecified    Irritable bowel syndrome    Meningitis 11/2019   MI (myocardial infarction) ([place]) 1994   no stent   Neuromuscular disorder ([place])    nerve pain in back and legs   Osteoporosis    Pneumonia 2021   RBBB    Sepsis ([place]) 11/2019   Skin cancer    TIA (transient ischemic attack) 2014   Vitamin D deficiency, unspecified    Wheezing      Transfusion: 2 units of PRBC on 08/20/2020   Consultants (if any):   Discharged Condition: Improved  Hospital Course: Andrea [name] is an 75 y.o. female who was admitted 08/19/2020 with a diagnosis of painful orthopedic hardware left hip with left hip osteoarthritis and went to the operating room on 08/19/2020 and underwent the above named procedures.    Surgeries: Procedure(s): HARDWARE REMOVAL TOTAL HIP ARTHROPLASTY ANTERIOR APPROACH on 08/19/2020 Patient tolerated the surgery well. Taken to PACU where she was stabilized and then transferred to the orthopedic floor.  Started on Lovenox 40 mg q 24 hrs. Foot pumps applied  bilaterally at 80 mm. Heels elevated on bed with rolled towels. No evidence of DVT. Negative Homan. Physical therapy started on day #1 for gait training and transfer. OT started day #1 for ADL and assisted devices.   Patient's foley was d/c on day #1.  On postop day 1 patient with acute postop blood loss anemia with hemoglobin of 6.2.  She is given 2 units of packed red  blood cells and hemoglobin improved to 10.  On postop day 2 and 3 hemoglobin improved.  She continued iron supplements.  Slow progress of physical therapy.  On post op day #8 patient was stable and ready for discharge to SNF.    Please remove provena negative pressure dressing on 09/02/2020 and apply honey comb dressing. Keep dressing clean and dry at all times.   WEIGHT BEARING Toe touch weight bearing LLE   Implants: Medacta  50 mm Mpact TM cup and liner with metal L 28 mm head and 0 standard cemented Quadra stem  She was given perioperative antibiotics:  Anti-infectives (From admission, onward)    Start     Dose/Rate Route Frequency Ordered Stop   08/19/20 1930  ceFAZolin (ANCEF) IVPB 2g/100 mL premix        2 g 200 mL/hr over 30 Minutes Intravenous Every 6 hours 08/19/20 1712 08/20/20 2057   08/19/20 0600  ceFAZolin (ANCEF) IVPB 2g/100 mL premix        2 g 200 mL/hr over 30 Minutes Intravenous On call to O.R. 08/19/20 9622 08/19/20 1341     .  She was given sequential compression devices, early ambulation, and Lovenox TEDs for DVT prophylaxis.  She benefited maximally from the hospital stay and there were no complications.    Recent vital signs:  Vitals:   08/27/20 0341 08/27/20 0737  BP: (!) 106/48 (!) 110/55  Pulse: 82 72  Resp: 14 16  Temp: 98 F (36.7 C) 97.9 F (36.6 C)  SpO2: 93% 97%    Recent laboratory studies:  Lab Results  Component Value Date   HGB 8.2 (L) 08/23/2020   HGB 9.2 (L) 08/22/2020   HGB 8.7 (L) 08/21/2020   Lab Results  Component Value Date   WBC 9.8 08/23/2020   PLT 110 (L) 08/23/2020   Lab Results  Component Value Date   INR 1.2 11/16/2019   Lab Results  Component Value Date   NA 139 08/20/2020   K 4.8 08/20/2020   CL 114 (H) 08/20/2020   CO2 20 (L) 08/20/2020   BUN 33 (H) 08/20/2020   CREATININE 1.31 (H) 08/26/2020   GLUCOSE 192 (H) 08/20/2020    Discharge Medications:   Allergies as of 08/27/2020       Reactions    Metformin And Related Diarrhea   Sulfa Antibiotics Rash        Medication List     STOP taking these medications    HYDROcodone-acetaminophen 10-325 MG tablet Commonly known as: NORCO   nitrofurantoin (macrocrystal-monohydrate) 100 MG capsule Commonly known as: Macrobid       TAKE these medications    Alcohol Prep Pads Use twice daily prior to SQ injection of insulin to clean skin   atorvastatin 80 MG tablet Commonly known as: LIPITOR TAKE 1 TABLET BY MOUTH EVERY DAY   B-12 1000 MCG Caps Take 1,000 mcg by mouth daily.   BERBERINE COMPLEX PO Take 1 tablet by mouth 2 (two) times daily with a meal.   Calcium Carbonate-Vitamin D 600-400 MG-UNIT tablet Take 1  tablet by mouth 2 (two) times daily.   docusate sodium 100 MG capsule Commonly known as: COLACE Take 1 capsule (100 mg total) by mouth 2 (two) times daily.   enoxaparin 40 MG/0.4ML injection Commonly known as: LOVENOX Inject 0.4 mLs (40 mg total) into the skin daily for 14 days.   furosemide 20 MG tablet Commonly known as: LASIX Take 1 tablet (20 mg total) by mouth daily.   insulin glargine 100 UNIT/ML injection Commonly known as: LANTUS Inject 65 Units into the skin daily.   INSULIN SYRINGE 1CC/29G 29G X 1/2" 1 ML Misc For lantus injections twice daily   losartan 25 MG tablet Commonly known as: COZAAR TAKE 1 TABLET BY MOUTH EVERY DAY   metoprolol succinate 25 MG 24 hr tablet Commonly known as: TOPROL-XL TAKE 1 TABLET BY MOUTH EVERY DAY   Omega-3 1000 MG Caps Take 1,000 mg by mouth daily.   omeprazole 40 MG capsule Commonly known as: PRILOSEC TAKE 1 CAPSULE BY MOUTH EVERY DAY   OneTouch Delica Lancets 66Q Misc To check blood sugar daily  DX: E11.9   OneTouch Verio test strip Generic drug: glucose blood TO CHECK BLOOD SUGAR ONCE DAILY.   oxybutynin 10 MG 24 hr tablet Commonly known as: DITROPAN-XL TAKE 1 TABLET BY MOUTH EVERYDAY AT BEDTIME   oxyCODONE 5 MG immediate release  tablet Commonly known as: Oxy IR/ROXICODONE Take 1-2 tablets (5-10 mg total) by mouth every 4 (four) hours as needed for moderate pain (pain score 4-6).   potassium chloride 10 MEQ tablet Commonly known as: KLOR-CON Take 1 tablet (10 mEq total) by mouth daily.   PRESERVISION AREDS 2 PO Take 1 tablet by mouth in the morning and at bedtime.   SINUS RINSE NA Place 1 application into the nose at bedtime.   traMADol 50 MG tablet Commonly known as: ULTRAM Take 1 tablet (50 mg total) by mouth every 6 (six) hours as needed.   TRUEplus Pen Needles 31G X 6 MM Misc Generic drug: Insulin Pen Needle Use with lantus two times daily.   Trulicity 1.5 HU/7.6LY Sopn Generic drug: Dulaglutide INJECT 1.5MG  INTO THE SKIN ONCE A WEEK AS DIRECTED   Vitamin D (Ergocalciferol) 1.25 MG (50000 UNIT) Caps capsule Commonly known as: DRISDOL TAKE ONE CAPSULE BY MOUTH ONCE WEEKLY ON THE SAME DAY EACH WEEK (ON FRIDAYS)        Diagnostic Studies: DG HIP OPERATIVE UNILAT W OR W/O PELVIS LEFT  Result Date: 08/19/2020 CLINICAL DATA:  Hardware removal and anterior upper placement EXAM: OPERATIVE LEFT HIP (WITH PELVIS IF PERFORMED) 1 VIEW TECHNIQUE: Fluoroscopic spot image(s) were submitted for interpretation post-operatively. COMPARISON:  None. FINDINGS: Initial image demonstrates intramedullary rod fixation of the left femur. There is subsequent removal and placement of a total hip arthroplasty. IMPRESSION: Post left femur fracture fixation hardware removal with total hip arthroplasty. Electronically Signed   By: Macy Mis M.D.   On: 08/19/2020 14:04   DG HIP UNILAT WITH PELVIS 2-3 VIEWS LEFT  Result Date: 08/24/2020 CLINICAL DATA:  Recent left hip arthroplasty with acetabular fracture EXAM: DG HIP (WITH OR WITHOUT PELVIS) 2-3V LEFT COMPARISON:  08/19/2020 FINDINGS: Postsurgical changes status post left hip arthroplasty. 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 No definite periprosthetic  fracture line is seen. Chronic posttraumatic deformity from prior intertrochanteric fracture. Expected postoperative changes within the overlying soft tissues. IMPRESSION: Postsurgical changes status post left hip arthroplasty with acetabular protrusio alignment. Although not definitively seen, underlying acetabular fracture is suspected. Electronically Signed  By: Davina Poke D.O.   On: 08/24/2020 14:50   DG HIP UNILAT W OR W/O PELVIS 2-3 VIEWS LEFT  Result Date: 08/19/2020 CLINICAL DATA:  Patient status post left hip replacement today. EXAM: DG HIP (WITH OR WITHOUT PELVIS) 2-3V LEFT COMPARISON:  Intraoperative imaging today. FINDINGS: A new bipolar left hip hemiarthroplasty is in place. The device is located. The patient has new fractures of the medial and inferior left acetabulum. No femur fracture is identified. IMPRESSION: Acute left acetabular fractures after hip replacement. These results were called by telephone at the time of interpretation on 08/19/2020 at 2:38 pm to provider Jeanene Erb, RN, who verbally acknowledged these results. Electronically Signed   By: Inge Rise M.D.   On: 08/19/2020 14:40   VAS [place] LOWER EXTREMITY VENOUS (DVT)  Result Date: 08/18/2020  Lower Venous DVT Study Patient Name:  Andrea [name]  Date of Exam:   08/18/2020 Medical Rec #: [id number]         Accession #:    [id number] Date of Birth: [birth year]         Patient Gender: F Patient Age:   17Y Exam Location:  [facility] Procedure:      VAS [place] LOWER EXTREMITY VENOUS (DVT) Referring Phys: [id number] Malachi Bonds D VISSER --------------------------------------------------------------------------------  Indications: Pain, and calf tightness. Other Indications: Seen at [facility] for in 05/2020, with note of leg length                    discrepancy: may be related to the patient's hip replacement,                    as well as a cause for the tight feeling. Risk Factors: Obesity and CVA/TIA and anemia. Comparison Study:  04/21/20 left lower extremity venous duplex negative for deep                   or superficial thrombus, no reflux noted. Performing Technologist: [employer] BS, RVT, RDCS  Examination Guidelines: A complete evaluation includes B-mode imaging, spectral Doppler, color Doppler, and power Doppler as needed of all accessible portions of each vessel. Bilateral testing is considered an integral part of a complete examination. Limited examinations for reoccurring indications may be performed as noted. The reflux portion of the exam is performed with the patient in reverse Trendelenburg.  +---------+---------------+---------+-----------+----------+--------------+ LEFT     CompressibilityPhasicitySpontaneityPropertiesThrombus Aging +---------+---------------+---------+-----------+----------+--------------+ CFV      Full           Yes      Yes                                 +---------+---------------+---------+-----------+----------+--------------+ SFJ      Full           Yes      Yes                                 +---------+---------------+---------+-----------+----------+--------------+ FV Prox  Full           Yes      Yes                                 +---------+---------------+---------+-----------+----------+--------------+ FV Mid   Full           Yes  Yes                                 +---------+---------------+---------+-----------+----------+--------------+ FV DistalFull           Yes      Yes                                 +---------+---------------+---------+-----------+----------+--------------+ PFV      Full           Yes      Yes                                 +---------+---------------+---------+-----------+----------+--------------+ POP      Full           Yes      Yes                                 +---------+---------------+---------+-----------+----------+--------------+ PTV      Full           Yes      Yes                                  +---------+---------------+---------+-----------+----------+--------------+ PERO     Full           Yes      Yes                                 +---------+---------------+---------+-----------+----------+--------------+ Gastroc  Full           Yes      Yes                                 +---------+---------------+---------+-----------+----------+--------------+ GSV      Full           Yes      Yes                                 +---------+---------------+---------+-----------+----------+--------------+ Pulsatile venous flow from the groin to the proximal calf.    Summary: LEFT: - There is no evidence of deep vein thrombosis in the lower extremity. - There is no evidence of superficial venous thrombosis.  *See table(s) above for measurements and observations. Electronically signed by Quay Burow MD on 08/18/2020 at 12:32:50 PM.    Final     Disposition: Discharge disposition: 03-Skilled Nursing Facility         Follow-up Information     Duanne Guess, PA-C. Go on 09/02/2020.   Specialties: Orthopedic Surgery, Emergency Medicine Why: @ 2:45 pm Contact information: [place] [address] [phone]                  Signed: Feliberto Gottron 08/27/2020, 7:54 AM

## 2020-08-22 NOTE — Care Management Important Message (Signed)
Important Message  Patient Details  Name: Andrea [name] MRN: [id number] Date of Birth: April 20, 1945   Medicare Important Message Given:  Yes     [name] 08/22/2020, 11:27 AM

## 2020-08-22 NOTE — Discharge Instructions (Addendum)

## 2020-08-22 NOTE — Progress Notes (Signed)
Inpatient Diabetes Program Recommendations  AACE/ADA: New Consensus Statement on Inpatient Glycemic Control (2015)  Target Ranges:  Prepandial:   less than 140 mg/dL      Peak postprandial:   less than 180 mg/dL (1-2 hours)      Critically ill patients:  140 - 180 mg/dL   Results for Andrea [name], Andrea [name] (MRN [id number]) as of 08/22/2020 09:40  Ref. Range 08/22/2020 08:23  Glucose-Capillary Latest Ref Range: 70 - 99 mg/dL 62 (L)    Home DM Meds: Lantus 65 units Daily        Trulicity 1.5 mg Qweek   Current Orders: Lantus 65 units Daily       Novolog  0-15 units TID     MD- Note CBG 62 this AM  Please consider reducing Lantus to 60 units Daily    --Will follow patient during hospitalization--  Wyn Quaker RN, MSN, CDE Diabetes Coordinator Inpatient Glycemic Control Team Team Pager: [phone] (8a-5p)

## 2020-08-22 NOTE — TOC Progression Note (Signed)
Transition of Care [facility]) - Progression Note    Patient Details  Name: Andrea [name] MRN: [id number] Date of Birth: Nov 07, 1945  [facility]) CM/SW [place], RN Phone Number: 08/22/2020, 10:08 AM  Clinical Narrative:   PT saw patient this AM, extensive assist.  SNF workup started, will communicate bed offers to patient and family following offers.  TOC contact information given, TOC to follow to discharge.      Barriers to Discharge: Continued Medical Work up  Expected Discharge Plan and Services     Discharge Planning Services: CM Consult   Living arrangements for the past 2 months: Single Family Home                                       Social Determinants of Health (SDOH) Interventions    Readmission Risk Interventions Readmission Risk Prevention Plan 08/21/2020  Transportation Screening Complete  PCP or Specialist Appt within 5-7 Days Complete  Home Care Screening Complete  Medication Review (RN CM) Complete  Some recent data might be hidden

## 2020-08-22 NOTE — Progress Notes (Signed)
Physical Therapy Treatment Patient Details Name: Andrea [name] MRN: [id number] DOB: 22-Nov-1945 Today's Date: 08/22/2020    History of Present Illness 75 y/o female s/p L total hip 6/14, had low HGB with 2 unit transfusion POD1.  Recently pt has been very limited with mobility, apparently nearly w/c bound recently. PMH includes CAD, HF, COPD, DM type 2, MI, and TIA.    PT Comments    Pt was pleasant and with encouragement was motivated to participate during the session. Pt's HR and SPO2 WNL throughout session on room air. Pt reported much pain in bilateral LE at 9/10 R LE and 7/10 L LE. Pt required mod-max assist +2 for most functional mobility secondary to pain and generalized weakness. Pt reported decreased pain after repositioning to edge of bed. Pt transfer to chair attempt stopped secondary to pt voiding, excess L hip incision drainage, and pain. Pt is making progress towards goal with less assistance required for supine<>sit on EOB. Pt will benefit from PT services in a SNF setting upon discharge to safely address deficits listed in patient problem list for decreased caregiver assistance and eventual return to PLOF.      Follow Up Recommendations  SNF;Supervision/Assistance - 24 hour     Equipment Recommendations  None recommended by PT    Recommendations for Other Services       Precautions / Restrictions Precautions Precautions: Anterior Hip Restrictions Weight Bearing Restrictions: Yes LLE Weight Bearing: Weight bearing as tolerated    Mobility  Bed Mobility Overal bed mobility: Needs Assistance Bed Mobility: Sit to Supine     Supine to sit: Max assist Sit to supine: Max assist;+2 for physical assistance        Transfers Overall transfer level: Needs assistance Equipment used: Rolling walker (2 wheeled) Transfers: Sit to/from Stand Sit to Stand: Max assist;+2 physical assistance         General transfer comment: Pt voided during trasfer requiring pt to  return to bed. Pt maintained standing for 2 min with mod-max assist +2.  Ambulation/Gait             General Gait Details: NT, pt limited by pain   Stairs             Wheelchair Mobility    Modified Rankin (Stroke Patients Only)       Balance Overall balance assessment: Needs assistance Sitting-balance support: Single extremity supported Sitting balance-Leahy Scale: Fair     Standing balance support: Bilateral upper extremity supported Standing balance-Leahy Scale: Zero Standing balance comment: mod-max +2 assist to maintain upright                            Cognition Arousal/Alertness: Awake/alert Behavior During Therapy: WFL for tasks assessed/performed Overall Cognitive Status: Within Functional Limits for tasks assessed                                        Exercises Total Joint Exercises Ankle Circles/Pumps: AROM;Both;10 reps;Supine Quad Sets: AROM;Both;10 reps;Supine Gluteal Sets: Strengthening;Both;10 reps;Supine Towel Squeeze: AROM;Strengthening;Both;10 reps;Seated;Other (comment) (with pillow) Straight Leg Raises: AROM;AAROM;Strengthening;Left;10 reps;Supine Long Arc Quad: AROM;Strengthening;Both;10 reps;Seated Other Exercises Other Exercises: HEP education verbally and via handout.    General Comments        Pertinent Vitals/Pain Pain Assessment: 0-10 Pain Score: 7  Pain Location: 7/10 left hip and 9/10 right hip Pain Descriptors /  Indicators: Aching;Sore Pain Intervention(s): Limited activity within patient's tolerance;Monitored during session;Repositioned;Premedicated before session    Home Living                      Prior Function            PT Goals (current goals can now be found in the care plan section) Progress towards PT goals: Progressing toward goals    Frequency    BID      PT Plan Current plan remains appropriate    Co-evaluation              AM-PAC PT "6  Clicks" Mobility   Outcome Measure  Help needed turning from your back to your side while in a flat bed without using bedrails?: Total Help needed moving from lying on your back to sitting on the side of a flat bed without using bedrails?: Total Help needed moving to and from a bed to a chair (including a wheelchair)?: Total Help needed standing up from a chair using your arms (e.g., wheelchair or bedside chair)?: Total Help needed to walk in hospital room?: Total Help needed climbing 3-5 steps with a railing? : Total 6 Click Score: 6    End of Session Equipment Utilized During Treatment: Gait belt Activity Tolerance: Patient limited by pain Patient left: in bed;with call bell/phone within reach;with bed alarm set;with nursing/sitter in room Nurse Communication: Mobility status;Other (comment) (Pt needed bed linens change secondary to pt voiding and L hip incision drainage on bed.) PT Visit Diagnosis: Muscle weakness (generalized) (M62.81);Unsteadiness on feet (R26.81);Pain Pain - Right/Left: Left Pain - part of body: Hip     Time: 0922-1003 PT Time Calculation (min) (ACUTE ONLY): 41 min  Charges:                        Dayton Scrape SPT 08/22/20, 1:55 PM

## 2020-08-23 LAB — CBC
HCT: 24.9 % — ABNORMAL LOW (ref 36.0–46.0)
Hemoglobin: 8.2 g/dL — ABNORMAL LOW (ref 12.0–15.0)
MCH: 29.6 pg (ref 26.0–34.0)
MCHC: 32.9 g/dL (ref 30.0–36.0)
MCV: 89.9 fL (ref 80.0–100.0)
Platelets: 110 10*3/uL — ABNORMAL LOW (ref 150–400)
RBC: 2.77 MIL/uL — ABNORMAL LOW (ref 3.87–5.11)
RDW: 15.8 % — ABNORMAL HIGH (ref 11.5–15.5)
WBC: 9.8 10*3/uL (ref 4.0–10.5)
nRBC: 0.2 % (ref 0.0–0.2)

## 2020-08-23 LAB — GLUCOSE, CAPILLARY
Glucose-Capillary: 81 mg/dL (ref 70–99)
Glucose-Capillary: 68 mg/dL — ABNORMAL LOW (ref 70–99)
Glucose-Capillary: 72 mg/dL (ref 70–99)
Glucose-Capillary: 141 mg/dL — ABNORMAL HIGH (ref 70–99)
Glucose-Capillary: 104 mg/dL — ABNORMAL HIGH (ref 70–99)

## 2020-08-23 NOTE — Plan of Care (Signed)
Patient had an uneventful shift. No changes in neurological and neurovascular assessments. No complaints of pain.Denies any needs at this time. All Safety measures maintained. Care continues.  Problem: Education: Goal: Knowledge of General Education information will improve Description: Including pain rating scale, medication(s)/side effects and non-pharmacologic comfort measures 08/23/2020 0601 by Windell Norfolk, RN Outcome: Progressing 08/23/2020 0600 by Windell Norfolk, RN Outcome: Progressing   Problem: Pain Managment: Goal: General experience of comfort will improve 08/23/2020 0601 by Windell Norfolk, RN Outcome: Progressing 08/23/2020 0600 by Windell Norfolk, RN Outcome: Progressing   Problem: Safety: Goal: Ability to remain free from injury will improve 08/23/2020 0601 by Windell Norfolk, RN Outcome: Progressing 08/23/2020 0600 by Windell Norfolk, RN Outcome: Progressing   Problem: Skin Integrity: Goal: Risk for impaired skin integrity will decrease 08/23/2020 0601 by Windell Norfolk, RN Outcome: Progressing 08/23/2020 0600 by Windell Norfolk, RN Outcome: Progressing

## 2020-08-23 NOTE — Progress Notes (Signed)
Hypoglycemic Event  CBG: 68  Treatment: 8 oz juice/soda  Symptoms: None  Follow-up CBG: Time: 1240  CBG Result: 72  Possible Reasons for Event: Inadequate meal intake     Erasmo Leventhal

## 2020-08-23 NOTE — Progress Notes (Signed)
Occupational Therapy Treatment Patient Details Name: Andrea [name] MRN: [id number] DOB: 28-Feb-1946 Today's Date: 08/23/2020    History of present illness 75 y/o female s/p L total hip 6/14, had low HGB with 2 unit transfusion POD1.  Recently pt has been very limited with mobility, apparently nearly w/c bound recently. PMH includes CAD, HF, COPD, DM type 2, MI, and TIA.   OT comments  Pt seen for OT tx this date to f/u re: safety with ADLs/ADL mobility. Pt wanting to get repositioned in bed and politely declines getting OOB again at this time as she finished PT session not long before OT presented to room. Pt agreeable to participating in repositioning efforts. Pt requires MOD A +2 with use of draw sheet and cues to bend knee of non-op LE to push and assist in propulsion as well as upper extremities extended to pull on bed rails to optimize position. Once in more optimal position, OT engages pt in bed level therex including: modified bed level SITUP from moderately elevated HOB (~25 degrees) position for 2 sets x5 reps with ~1-2 min rest break between and contralateral reaching x10 each side (alternating). These exercises chosen to improve core strength and control for fall prevention as well as to improve bed mobility and decreased caregiver burden. Pt left in bed with all needs met and in reach, RN present assisting patient. Will continue to follow acutely and continue to anticipate that pt will require STR in SNF setting upon d/c.    Follow Up Recommendations  SNF    Equipment Recommendations  Other (comment) (defer)    Recommendations for Other Services      Precautions / Restrictions Precautions Precautions: Anterior Hip Restrictions Weight Bearing Restrictions: Yes LLE Weight Bearing: Weight bearing as tolerated       Mobility Bed Mobility Overal bed mobility: Needs Assistance             General bed mobility comments: MOD A +2 wiht use of draw sheet for propulsion  towards HOB with OT encouraging pt bending to push through non-op LE and use of UEs to use bed rails to pull and assist with repositioning efforts. Pt tolerates well    Transfers                      Balance Overall balance assessment: Needs assistance Sitting-balance support: Feet supported Sitting balance-Leahy Scale: Fair Sitting balance - Comments: leaning to the R to unweight L hip   Standing balance support: Bilateral upper extremity supported Standing balance-Leahy Scale: Zero Standing balance comment: min-mod assist +2 to maintain upright posture                           ADL either performed or assessed with clinical judgement   ADL                                               Vision       Perception     Praxis      Cognition Arousal/Alertness: Awake/alert Behavior During Therapy: WFL for tasks assessed/performed Overall Cognitive Status: Within Functional Limits for tasks assessed  General Comments: somewhat delayed responses this date/just generally appearing drowsy, overall appropriate cognitively still. RN notices that pt's BG is 68 and gives OJ and pt is noted to perk up. In addition, pt reports having hallucinations overnight close to time that oxy was administerred.        Exercises Total Joint Exercises Ankle Circles/Pumps: AROM;Both;10 reps;Supine Quad Sets: Strengthening;Both;10 reps;Supine Gluteal Sets: Strengthening;Both;10 reps;Supine Long Arc Quad: AROM;Strengthening;Both;10 reps;Seated Bridges: AROM;Strengthening;Supine;Other (comment) (bridges x2 for functional task) Other Exercises Other Exercises: OT engages pt in modified bed level SITUP from moderately elevated HOB (~25 degrees) position for 2 sets x5 reps with ~1-2 min rest break between and contralateral reaching x10 each side (alternating) to improve core strength and control for fall prevention as well  as to improve bed mobility and decreased caregiver burden.   Shoulder Instructions       General Comments      Pertinent Vitals/ Pain       Pain Assessment: 0-10 Pain Score: 6  Pain Location: Bilateral hips Pain Descriptors / Indicators: Aching;Sore Pain Intervention(s): Limited activity within patient's tolerance;Monitored during session;Repositioned;Patient requesting pain meds-RN notified;RN gave pain meds during session  Home Living                                          Prior Functioning/Environment              Frequency  Min 2X/week        Progress Toward Goals  OT Goals(current goals can now be found in the care plan section)  Progress towards OT goals: Progressing toward goals  Acute Rehab OT Goals Patient Stated Goal: get to where she can do some walking OT Goal Formulation: With patient Time For Goal Achievement: 09/04/20 Potential to Achieve Goals: [place] Discharge plan remains appropriate    Co-evaluation                 AM-PAC OT "6 Clicks" Daily Activity     Outcome Measure   Help from another person eating meals?: None Help from another person taking care of personal grooming?: A Little Help from another person toileting, which includes using toliet, bedpan, or urinal?: A Lot Help from another person bathing (including washing, rinsing, drying)?: A Lot Help from another person to put on and taking off regular upper body clothing?: A Little Help from another person to put on and taking off regular lower body clothing?: Total 6 Click Score: 15    End of Session    OT Visit Diagnosis: Unsteadiness on feet (R26.81);Other abnormalities of gait and mobility (R26.89);Muscle weakness (generalized) (M62.81);Pain Pain - Right/Left: Left Pain - part of body: Hip   Activity Tolerance Patient limited by pain   Patient Left in bed;with bed alarm set;with call bell/phone within reach;with nursing/sitter in room    Nurse Communication Mobility status        Time: 3419-6222 OT Time Calculation (min): 17 min  Charges: OT General Charges $OT Visit: 1 Visit OT Treatments $Therapeutic Exercise: 8-22 mins   Gerrianne Scale, MS, OTR/L ascom [phone] 08/23/20, 2:51 PM

## 2020-08-23 NOTE — Progress Notes (Signed)
Physical Therapy Treatment Patient Details Name: Andrea [name] MRN: [id number] DOB: 1945-06-29 Today's Date: 08/23/2020    History of Present Illness 75 y/o female s/p L total hip 6/14, had low HGB with 2 unit transfusion POD1.  Recently pt has been very limited with mobility, apparently nearly w/c bound recently. PMH includes CAD, HF, COPD, DM type 2, MI, and TIA.    PT Comments    Pt was pleasant and willing to participate during the session. Patient gave good effort with therapeutic exercise below. Pt requires significant physical assistance for functional mobility limited by pain and generalized weakness. Pt requires min-mod verbal cues to keep pt on task, transfer sequencing, and foot and hand placement. Pt will benefit from PT services in a SNF setting upon discharge to safely address deficits listed in patient problem list for decreased caregiver assistance and eventual return to PLOF.    Follow Up Recommendations  SNF;Supervision/Assistance - 24 hour     Equipment Recommendations  None recommended by PT    Recommendations for Other Services       Precautions / Restrictions Precautions Precautions: Anterior Hip Precaution Booklet Issued: Yes (comment) Restrictions Weight Bearing Restrictions: Yes LLE Weight Bearing: Weight bearing as tolerated    Mobility  Bed Mobility Overal bed mobility: Needs Assistance Bed Mobility: Sit to Supine     Supine to sit: +2 for physical assistance;Mod assist Sit to supine: Max assist;+2 for physical assistance        Transfers Overall transfer level: Needs assistance Equipment used: Rolling walker (2 wheeled) Transfers: Sit to/from Stand Sit to Stand: Mod assist;+2 physical assistance            Ambulation/Gait Ambulation/Gait assistance: Min assist;+2 physical assistance Gait Distance (Feet): 1 Feet Assistive device: Rolling walker (2 wheeled) Gait Pattern/deviations: Shuffle;Decreased weight shift to left;Wide base  of support;Trunk flexed Gait velocity: decreased   General Gait Details: Pt slightly shuffled LLE to bring feet closer together in standing. Encouragement to take steps provided but pt insisted that she needed to sit down.    Stairs             Wheelchair Mobility    Modified Rankin (Stroke Patients Only)       Balance Overall balance assessment: Needs assistance Sitting-balance support: Feet supported Sitting balance-Leahy Scale: Fair Sitting balance - Comments: leaning to the R to unweight L hip   Standing balance support: Bilateral upper extremity supported Standing balance-Leahy Scale: Zero Standing balance comment: min assist +2 to maintain upright posture                            Cognition Arousal/Alertness: Awake/alert Behavior During Therapy: WFL for tasks assessed/performed Overall Cognitive Status: Within Functional Limits for tasks assessed                                        Exercises Total Joint Exercises Ankle Circles/Pumps: AROM;Both;10 reps;Supine Quad Sets: Strengthening;Both;10 reps;Supine Gluteal Sets: Strengthening;Both;10 reps;Supine Long Arc Quad: AROM;Strengthening;Both;10 reps;Seated Bridges: AROM;Strengthening;Supine;Other (comment) (bridges x2 for functional task)    General Comments        Pertinent Vitals/Pain Pain Assessment: 0-10 Pain Score: 6  Pain Location: Bilateral hips Pain Descriptors / Indicators: Aching;Sore Pain Intervention(s): Limited activity within patient's tolerance;Monitored during session;Repositioned    Home Living  Prior Function            PT Goals (current goals can now be found in the care plan section) Progress towards PT goals: Progressing toward goals    Frequency    BID      PT Plan Current plan remains appropriate    Co-evaluation              AM-PAC PT "6 Clicks" Mobility   Outcome Measure  Help needed turning  from your back to your side while in a flat bed without using bedrails?: A Lot Help needed moving from lying on your back to sitting on the side of a flat bed without using bedrails?: Total Help needed moving to and from a bed to a chair (including a wheelchair)?: Total Help needed standing up from a chair using your arms (e.g., wheelchair or bedside chair)?: Total Help needed to walk in hospital room?: Total Help needed climbing 3-5 steps with a railing? : Total 6 Click Score: 7    End of Session Equipment Utilized During Treatment: Gait belt Activity Tolerance: Patient limited by pain Patient left: in bed;with call bell/phone within reach;with bed alarm set;with SCD's reapplied Nurse Communication: Mobility status PT Visit Diagnosis: Muscle weakness (generalized) (M62.81);Unsteadiness on feet (R26.81);Pain Pain - Right/Left: Left Pain - part of body: Hip     Time: 1610-9604 PT Time Calculation (min) (ACUTE ONLY): 28 min  Charges:                        Dayton Scrape SPT 08/23/20, 12:46 PM

## 2020-08-23 NOTE — Progress Notes (Signed)
ORTHOPAEDICS PROGRESS NOTE  PATIENT NAME: Andrea [name] DOB: May 30, 1945  MRN: [id number]  POD # 4: Left total hip arthroplasty, hardware removal  Subjective: The patient is awake and alert this morning.  Nursing reports some confusion last night.  Narcotics were held accordingly.  Objective: Vital signs in last 24 hours: Temp:  [97.9 F (36.6 C)-99.1 F (37.3 C)] 99.1 F (37.3 C) (06/18 0808) Pulse Rate:  [75-88] 88 (06/18 0808) Resp:  [16-18] 18 (06/18 0808) BP: (98-114)/(38-53) 114/48 (06/18 0808) SpO2:  [92 %-97 %] 94 % (06/18 0808)  Intake/Output from previous day: No intake/output data recorded.  Recent Labs    08/20/20 1756 08/21/20 0420 08/22/20 0400 08/23/20 0502  WBC  --  9.2 10.0 9.8  HGB 10.0* 8.7* 9.2* 8.2*  HCT 29.4* 26.0* 26.6* 24.9*  PLT  --  87* 94* 110*    EXAM General: Well-developed well-nourished female seen in no apparent discomfort. Lungs: clear to auscultation Cardiac: normal rate and regular rhythm Left lower extremity: Wound VAC is in place.  The posterior incision demonstrates moderate amount of serous drainage.  No significant ecchymosis. Neurologic: Awake, alert, and oriented.  Sensory and motor function are intact.  Assessment: Left total hip arthroplasty, hardware removal  Secondary diagnoses: Anxiety Coronary artery disease Chronic systolic heart failure COPD Depression Diabetes Gastroesophageal reflux disease Hypertension Hyperlipidemia Irritable bowel syndrome History of myocardial infarction Osteoporosis TIA Vitamin D deficiency  Plan: Discussed modification of analgesics with nursing. Posterior hip dressing may be changed as needed and reinforced. Continue physical therapy and Occupational Therapy as per total hip arthroplasty rehab protocol. Plan is to go Skilled nursing facility after hospital stay. DVT Prophylaxis - Lovenox and TED hose  Fredick Schlosser P. Holley Bouche M.D.

## 2020-08-23 NOTE — Progress Notes (Signed)
Physical Therapy Treatment Patient Details Name: Andrea [name] MRN: [id number] DOB: 10-11-1945 Today's Date: 08/23/2020    History of Present Illness 75 y/o female s/p L total hip 6/14, had low HGB with 2 unit transfusion POD1.  Recently pt has been very limited with mobility, apparently nearly w/c bound recently. PMH includes CAD, HF, COPD, DM type 2, MI, and TIA.    PT Comments     Pt was pleasant and willing to participate during the session. Pt requires significant physical assistance for functional mobility limited by pain and generalized weakness.  Pt asks for frequent breaks requiring much encouragement for pt to provide good effort. Mod verbal cueing for sequencing and LE placement for transfers and bed mobility. Pt will benefit from PT services in a SNF setting upon discharge to safely address deficits listed in patient problem list for decreased caregiver assistance and eventual return to PLOF.   Follow Up Recommendations  SNF;Supervision/Assistance - 24 hour     Equipment Recommendations  None recommended by PT    Recommendations for Other Services       Precautions / Restrictions Precautions Precautions: Anterior Hip Precaution Booklet Issued: Yes (comment) Restrictions Weight Bearing Restrictions: Yes LLE Weight Bearing: Weight bearing as tolerated    Mobility  Bed Mobility Overal bed mobility: Needs Assistance Bed Mobility: Sit to Supine     Supine to sit: Mod assist Sit to supine: Max assist;+2 for physical assistance   General bed mobility comments: Verbal cues for sequencing.    Transfers Overall transfer level: Needs assistance Equipment used: Rolling walker (2 wheeled) Transfers: Sit to/from Stand Sit to Stand: Mod assist;+2 physical assistance         General transfer comment: Pt demonstrated stress incontinence during transfers. Mod verbal and visual cues for LLE placement and leaning forward to stand.  Ambulation/Gait Ambulation/Gait  assistance:Unable    General Gait Details: Pt limited by pain.   Stairs             Wheelchair Mobility    Modified Rankin (Stroke Patients Only)       Balance Overall balance assessment: Needs assistance Sitting-balance support: Feet supported Sitting balance-Leahy Scale: Fair Sitting balance - Comments: leaning to the R to unweight L hip   Standing balance support: Bilateral upper extremity supported Standing balance-Leahy Scale: Poor Standing balance comment: min assist +2 to maintain upright posture                            Cognition Arousal/Alertness: Awake/alert Behavior During Therapy: WFL for tasks assessed/performed Overall Cognitive Status: Within Functional Limits for tasks assessed                                       Exercises Total Joint Exercises Bridges: AROM;Strengthening;Supine;Other (comment) (bridges x2 for assistance scooting towards [facility]) Other Exercises Other Exercises: Static standing at the EOB 2 x 1-2 min for improved activity tolerance with RW and min assist    General Comments        Pertinent Vitals/Pain Pain Assessment: 0-10 Pain Score: 9  Pain Location: L hip and superior portion of gluteal cleft Pain Descriptors / Indicators: Aching;Sore Pain Intervention(s): Limited activity within patient's tolerance;Monitored during session;Repositioned    Home Living                      Prior  Function            PT Goals (current goals can now be found in the care plan section) Acute Rehab PT Goals Patient Stated Goal: get to where she can do some walking Progress towards PT goals: Progressing toward goals    Frequency    BID      PT Plan Current plan remains appropriate    Co-evaluation              AM-PAC PT "6 Clicks" Mobility   Outcome Measure  Help needed turning from your back to your side while in a flat bed without using bedrails?: A Lot Help needed moving from  lying on your back to sitting on the side of a flat bed without using bedrails?: Total Help needed moving to and from a bed to a chair (including a wheelchair)?: Total Help needed standing up from a chair using your arms (e.g., wheelchair or bedside chair)?: Total Help needed to walk in hospital room?: Total Help needed climbing 3-5 steps with a railing? : Total 6 Click Score: 7    End of Session Equipment Utilized During Treatment: Gait belt Activity Tolerance: Patient limited by pain Patient left: in bed;with call bell/phone within reach;with bed alarm set;with SCD's reapplied Nurse Communication: Mobility status (Notified of pain at superior gluteal cleft. Nurse noted a bruise in that region) PT Visit Diagnosis: Muscle weakness (generalized) (M62.81);Unsteadiness on feet (R26.81);Pain Pain - Right/Left: Left Pain - part of body: Hip     Time: 5188-4166 PT Time Calculation (min) (ACUTE ONLY): 32 min  Charges:                        Dayton Scrape SPT 08/23/20, 4:39 PM

## 2020-08-24 ENCOUNTER — Inpatient Hospital Stay: Payer: Medicare Other

## 2020-08-24 LAB — GLUCOSE, CAPILLARY
Glucose-Capillary: 74 mg/dL (ref 70–99)
Glucose-Capillary: 174 mg/dL — ABNORMAL HIGH (ref 70–99)
Glucose-Capillary: 220 mg/dL — ABNORMAL HIGH (ref 70–99)
Glucose-Capillary: 297 mg/dL — ABNORMAL HIGH (ref 70–99)

## 2020-08-24 NOTE — Progress Notes (Signed)
Physical Therapy Treatment Patient Details Name: Andrea [name] MRN: [id number] DOB: August 14, 1945 Today's Date: 08/24/2020    History of Present Illness 75 y/o female s/p L total hip 6/14, had low HGB with 2 unit transfusion POD1.  Recently pt has been very limited with mobility, apparently nearly w/c bound recently. PMH includes CAD, HF, COPD, DM type 2, MI, and TIA.    PT Comments    Pt was pleasant and with encouragement was motivated to participate during the session.  Pt requires significant physical assistance for functional mobility limited by pain and generalized weakness. Frequent verbal cues for sequencing during transfer and ambulating. Pt is making progress towards goals.  Pt will benefit from PT services in a SNF setting upon discharge to safely address deficits listed in patient problem list for decreased caregiver assistance and eventual return to PLOF.   Follow Up Recommendations  SNF;Supervision/Assistance - 24 hour     Equipment Recommendations  None recommended by PT    Recommendations for Other Services       Precautions / Restrictions Precautions Precautions: Anterior Hip Precaution Booklet Issued: Yes (comment) Restrictions Weight Bearing Restrictions: Yes LLE Weight Bearing: Weight bearing as tolerated    Mobility  Bed Mobility Overal bed mobility: Needs Assistance Bed Mobility: Supine to Sit     Supine to sit: Mod assist     General bed mobility comments: Mod assist for BLEs and trunk control    Transfers Overall transfer level: Needs assistance Equipment used: Rolling walker (2 wheeled) Transfers: Sit to/from Stand Sit to Stand: Min assist         General transfer comment: Pt demonstrated stress incontinence during transfers. Min verbal cues for LLE placement and leaning forward to stand.  Ambulation/Gait Ambulation/Gait assistance: Min assist;+2 physical assistance Gait Distance (Feet): 3 Feet Assistive device: Rolling walker (2  wheeled) Gait Pattern/deviations: Step-to pattern;Decreased step length - right;Decreased stance time - left;Wide base of support;Trunk flexed Gait velocity: decreased   General Gait Details: Verbal cues for sequencing turning with walker to recliner. +2 for safety   Stairs             Wheelchair Mobility    Modified Rankin (Stroke Patients Only)       Balance Overall balance assessment: Needs assistance Sitting-balance support: Feet supported Sitting balance-Leahy Scale: Fair Sitting balance - Comments: leaning to the R to unweight L hip   Standing balance support: Bilateral upper extremity supported Standing balance-Leahy Scale: Poor Standing balance comment: min assist +2 and heavy reliance on UEs with walker to maintain upright posture                            Cognition Arousal/Alertness: Awake/alert Behavior During Therapy: WFL for tasks assessed/performed Overall Cognitive Status: Within Functional Limits for tasks assessed                                        Exercises Total Joint Exercises Marching in Standing: AROM;Strengthening;Both;10 reps;Standing Other Exercises Other Exercises: Static standing at the EOB x1 for 1-2 min for improved activity tolerance with RW and min assist    General Comments        Pertinent Vitals/Pain Pain Score: 7  Pain Location: L hip Pain Descriptors / Indicators: Aching;Sore Pain Intervention(s): Limited activity within patient's tolerance;Monitored during session;Repositioned    Home Living  Prior Function            PT Goals (current goals can now be found in the care plan section) Progress towards PT goals: Progressing toward goals    Frequency    BID      PT Plan Current plan remains appropriate    Co-evaluation              AM-PAC PT "6 Clicks" Mobility   Outcome Measure  Help needed turning from your back to your side while in a  flat bed without using bedrails?: A Lot Help needed moving from lying on your back to sitting on the side of a flat bed without using bedrails?: Total Help needed moving to and from a bed to a chair (including a wheelchair)?: A Lot Help needed standing up from a chair using your arms (e.g., wheelchair or bedside chair)?: A Lot Help needed to walk in hospital room?: A Lot Help needed climbing 3-5 steps with a railing? : Total 6 Click Score: 10    End of Session Equipment Utilized During Treatment: Gait belt Activity Tolerance: Patient limited by pain Patient left: in chair;with call bell/phone within reach;with chair alarm set;with SCD's reapplied Nurse Communication: Mobility status PT Visit Diagnosis: Muscle weakness (generalized) (M62.81);Unsteadiness on feet (R26.81);Pain Pain - Right/Left: Left Pain - part of body: Hip     Time: 2227-2252 PT Time Calculation (min) (ACUTE ONLY): 25 min  Charges:                        Dayton Scrape SPT 08/24/20, 3:41 PM

## 2020-08-24 NOTE — Progress Notes (Addendum)
  Subjective: 5 Days Post-Op Procedure(s) (LRB): HARDWARE REMOVAL (Left) TOTAL HIP ARTHROPLASTY ANTERIOR APPROACH (Left) Patient reports pain as moderate and well-controlled.   Patient is  working with therapyt but continues to make very slow progress. Plan is to go Skilled nursing facility after hospital stay. Negative for chest pain and shortness of breath Fever: no Gastrointestinal: negative for nausea and vomiting.    Objective: Vital signs in last 24 hours: Temp:  [97.3 F (36.3 C)-98.7 F (37.1 C)] 98.5 F (36.9 C) (06/19 0757) Pulse Rate:  [76-83] 79 (06/19 0757) Resp:  [16-18] 18 (06/19 0757) BP: (108-125)/(45-50) 125/46 (06/19 0757) SpO2:  [92 %-96 %] 96 % (06/19 0757)  Intake/Output from previous day:  Intake/Output Summary (Last 24 hours) at 08/24/2020 1121 Last data filed at 08/24/2020 1019 Gross per 24 hour  Intake 720 ml  Output 875 ml  Net -155 ml    Intake/Output this shift: Total I/O In: 240 [P.O.:240] Out: -   Labs: Recent Labs    08/22/20 0400 08/23/20 0502  HGB 9.2* 8.2*   Recent Labs    08/22/20 0400 08/23/20 0502  WBC 10.0 9.8  RBC 3.04* 2.77*  HCT 26.6* 24.9*  PLT 94* 110*   No results for input(s): NA, K, CL, CO2, BUN, CREATININE, GLUCOSE, CALCIUM in the last 72 hours. No results for input(s): LABPT, INR in the last 72 hours.   EXAM General - Patient is Alert, Appropriate, and Oriented Extremity - Neurovascular intact Dorsiflexion/Plantar flexion intact Compartment soft Dressing/Incision -significant serous drainage noted from proximal incisions from hardware removal of previous rod; Prevena in place and working.  Motor Function - intact, moving foot and toes well on exam.  Cardiovascular- Regular rate and rhythm, no murmurs/rubs/gallops Respiratory- Lungs clear to auscultation bilaterally Gastrointestinal- soft, nontender, and active bowel sounds   Assessment/Plan: 5 Days Post-Op Procedure(s) (LRB): HARDWARE REMOVAL  (Left) TOTAL HIP ARTHROPLASTY ANTERIOR APPROACH (Left) Active Problems:   Status post total hip replacement, left  Estimated body mass index is 32.25 kg/m as calculated from the following:   Height as of this encounter: 4\' 9"  (1.448 m).   Weight as of this encounter: 67.6 kg. Advance diet Up with therapy Plan is for eventual d/c to SNF pending insurance approval.  Xrays of the L hip ordered due to patient's continued elevated pain and slow progress with therapy.   Honeycomb dressings changed and reinforced with sterile gauze. Seal around prevena reinforced with tegaderm.  DVT Prophylaxis - Lovenox, Ted hose, and SCDs Weight-Bearing as tolerated to left leg  Cassell Smiles, PA-C [facility] 08/24/2020, 11:21 AM

## 2020-08-24 NOTE — TOC Progression Note (Addendum)
Transition of Care [facility]) - Progression Note    Patient Details  Name: DORTHIA TOUT MRN: [id number] Date of Birth: 1945-12-04  Transition of Care [facility]) CM/SW Contact  Zigmund Daniel Dorian Pod, RN Phone Number: [phone] 08/24/2020, 2:18 PM  Clinical Narrative:    RN spoke with pt at bedside in the presence of her daughter Caryl Asp) concerning the only bed offer presented at this time [facility]. Pt did not remember discussing agencies however daughter informed pt that the agencies were discussed and [facility] was one of the facility of interest. Pt indecisive in making a decision to accept the bed offer and daughter has requested to re-educate pt on this discussed facility. Daughter requested to call RN back to the previous voice message left to daughter's phone by this [facility] RN.   Will continue to follow up accordingly with pt's decision on the accepting facility for SNF. Started authorization with [employer] portal.    Barriers to Discharge: Continued Medical Work up  Expected Discharge Plan and Services     Discharge Planning Services: CM Consult   Living arrangements for the past 2 months: Single Family Home                                       Social Determinants of Health (SDOH) Interventions    Readmission Risk Interventions Readmission Risk Prevention Plan 08/21/2020  Transportation Screening Complete  PCP or Specialist Appt within 5-7 Days Complete  Home Care Screening Complete  Medication Review (RN CM) Complete  Some recent data might be hidden

## 2020-08-25 LAB — GLUCOSE, CAPILLARY
Glucose-Capillary: 144 mg/dL — ABNORMAL HIGH (ref 70–99)
Glucose-Capillary: 185 mg/dL — ABNORMAL HIGH (ref 70–99)
Glucose-Capillary: 178 mg/dL — ABNORMAL HIGH (ref 70–99)
Glucose-Capillary: 188 mg/dL — ABNORMAL HIGH (ref 70–99)

## 2020-08-25 LAB — RESP PANEL BY RT-PCR (FLU A&B, COVID) ARPGX2
Influenza A by PCR: NEGATIVE
Influenza B by PCR: NEGATIVE
SARS Coronavirus 2 by RT PCR: NEGATIVE

## 2020-08-25 MED ORDER — FLEET ENEMA 7-19 GM/118ML RE ENEM
1.0000 | ENEMA | Freq: Once | RECTAL | Status: AC
  Administered 2020-08-25: 1 via RECTAL

## 2020-08-25 NOTE — TOC Progression Note (Signed)
Transition of Care [facility]) - Progression Note    Patient Details  Name: Andrea [name] MRN: [id number] Date of Birth: 10-Jul-1945  Transition of Care [facility]) CM/SW [place], RN Phone Number: 08/25/2020, 10:55 AM  Clinical Narrative:   Patient was accepted by [facility], however she only has 2 COVID vaccines, and not the booster.  [place] is unable to take patients unless they have had the booster for 2 weeks, thus we are unable to have give the booster here and send patient to the facility, thus they have to decline acceptance at this time.      Barriers to Discharge: Continued Medical Work up  Expected Discharge Plan and Services     Discharge Planning Services: CM Consult   Living arrangements for the past 2 months: Single Family Home Expected Discharge Date: 08/25/20                                     Social Determinants of Health (SDOH) Interventions    Readmission Risk Interventions Readmission Risk Prevention Plan 08/21/2020  Transportation Screening Complete  PCP or Specialist Appt within 5-7 Days Complete  Home Care Screening Complete  Medication Review (RN CM) Complete  Some recent data might be hidden

## 2020-08-25 NOTE — Progress Notes (Signed)
PT Cancellation Note  Patient Details Name: Andrea [name] MRN: [id number] DOB: 1945-11-25   Cancelled Treatment:    Reason Eval/Treat Not Completed: Other (comment) Upon entering room this AM nursing found working with pt and requested PT session be deferred until this afternoon. Will attempt to see pt at a future date/time as medically appropriate.     Dayton Scrape 08/25/2020, 1:38 PM

## 2020-08-25 NOTE — Progress Notes (Signed)
Physical Therapy Treatment Patient Details Name: Andrea [name] MRN: [id number] DOB: March 15, 1945 Today's Date: 08/25/2020    History of Present Illness 75 y/o female s/p L total hip 6/14, had low HGB with 2 unit transfusion POD1.  Recently pt has been very limited with mobility, apparently nearly w/c bound recently. PMH includes CAD, HF, COPD, DM type 2, MI, and TIA.    PT Comments    Pt was pleasant and motivated to participate during the session. Pt requires significant physical assistance as well as frequent verbal cues for functional mobility limited by pain and generalized weakness. Pt is making progress towards goals.  Pt will benefit from PT services in a SNF setting upon discharge to safely address deficits listed in patient problem list for decreased caregiver assistance and eventual return to PLOF.      Follow Up Recommendations  SNF;Supervision/Assistance - 24 hour     Equipment Recommendations  None recommended by PT    Recommendations for Other Services       Precautions / Restrictions Precautions Precautions: Anterior Hip Precaution Booklet Issued: Yes (comment) Restrictions Weight Bearing Restrictions: Yes LLE Weight Bearing: Weight bearing as tolerated    Mobility  Bed Mobility Overal bed mobility: Needs Assistance Bed Mobility: Supine to Sit     Supine to sit: Mod assist     General bed mobility comments: Mod assist for BLEs and trunk control    Transfers Overall transfer level: Needs assistance Equipment used: Rolling walker (2 wheeled) Transfers: Sit to/from Stand Sit to Stand: Min assist         General transfer comment: Pt demonstrated stress incontinence during transfer. Min verbal cues for LLE placement and leaning forward to stand  Ambulation/Gait Ambulation/Gait assistance: Min assist;+2 physical assistance Gait Distance (Feet): 3 Feet Assistive device: Rolling walker (2 wheeled) Gait Pattern/deviations: Step-to pattern;Decreased  step length - right;Decreased stance time - left;Wide base of support;Trunk flexed Gait velocity: decreased   General Gait Details: Verbal cues for sequencing turning with walker to recliner. +2 assist for increased safety. Pt encouraged to take more steps but pt unable.   Stairs             Wheelchair Mobility    Modified Rankin (Stroke Patients Only)       Balance Overall balance assessment: Needs assistance Sitting-balance support: Feet supported Sitting balance-Leahy Scale: Fair Sitting balance - Comments: leaning to the R to unweight L hip   Standing balance support: Bilateral upper extremity supported Standing balance-Leahy Scale: Poor Standing balance comment: min assist +2 and heavy reliance on UEs with walker to maintain upright posture                            Cognition Arousal/Alertness: Awake/alert Behavior During Therapy: WFL for tasks assessed/performed Overall Cognitive Status: Within Functional Limits for tasks assessed                                        Exercises Total Joint Exercises Towel Squeeze: Strengthening;Both;10 reps;Seated;Other (comment) (with pillow) Long Arc Quad: AROM;Strengthening;Both;10 reps;Seated Static standing at EOB 3-49min for improved activity tolerance    General Comments        Pertinent Vitals/Pain Pain Score: 7  Pain Location: L hip Pain Descriptors / Indicators: Aching;Sore Pain Intervention(s): Premedicated before session;Monitored during session;Limited activity within patient's tolerance    Home Living  Prior Function            PT Goals (current goals can now be found in the care plan section) Progress towards PT goals: Progressing toward goals    Frequency    BID      PT Plan Current plan remains appropriate    Co-evaluation              AM-PAC PT "6 Clicks" Mobility   Outcome Measure  Help needed turning from your back to  your side while in a flat bed without using bedrails?: A Lot Help needed moving from lying on your back to sitting on the side of a flat bed without using bedrails?: A Lot Help needed moving to and from a bed to a chair (including a wheelchair)?: A Lot Help needed standing up from a chair using your arms (e.g., wheelchair or bedside chair)?: A Lot Help needed to walk in hospital room?: A Lot Help needed climbing 3-5 steps with a railing? : Total 6 Click Score: 11    End of Session Equipment Utilized During Treatment: Gait belt Activity Tolerance: Patient limited by pain Patient left: in chair;with call bell/phone within reach;with chair alarm set;with SCD's reapplied;with family/visitor present Nurse Communication: Mobility status PT Visit Diagnosis: Muscle weakness (generalized) (M62.81);Unsteadiness on feet (R26.81);Pain Pain - Right/Left: Left Pain - part of body: Hip     Time: 2229-7989 PT Time Calculation (min) (ACUTE ONLY): 26 min  Charges:                        Dayton Scrape SPT 08/25/20, 5:32 PM

## 2020-08-25 NOTE — Care Management Important Message (Signed)
Important Message  Patient Details  Name: Andrea [name] MRN: [id number] Date of Birth: 07/17/1945   Medicare Important Message Given:  Yes  Patient is in an isolation room so I talked with her by phone [phone]) and reviewed her Important Message from Medicare.  She signed a copy on Friday, 6/17 and I asked if she would like another copy and she replied no as she still has her copy. I wished her well and thanked her for her time.  [name] 08/25/2020, 11:12 AM

## 2020-08-25 NOTE — TOC Progression Note (Signed)
Transition of Care [facility]) - Progression Note    Patient Details  Name: Andrea [name] MRN: [id number] Date of Birth: May 03, 1945  Transition of Care [facility]) CM/SW [place], RN Phone Number: 08/25/2020, 4:42 PM  Clinical Narrative:   Patient and family chose [facility].  [place] made aware. Possible dc tomorrow      Barriers to Discharge: Continued Medical Work up  Expected Discharge Plan and Services     Discharge Planning Services: CM Consult   Living arrangements for the past 2 months: Single Family Home Expected Discharge Date: 08/25/20                                     Social Determinants of Health (SDOH) Interventions    Readmission Risk Interventions Readmission Risk Prevention Plan 08/21/2020  Transportation Screening Complete  PCP or Specialist Appt within 5-7 Days Complete  Home Care Screening Complete  Medication Review (RN CM) Complete  Some recent data might be hidden

## 2020-08-25 NOTE — Progress Notes (Signed)
   Subjective: 6 Days Post-Op Procedure(s) (LRB): HARDWARE REMOVAL (Left) TOTAL HIP ARTHROPLASTY ANTERIOR APPROACH (Left) Patient reports pain as mild.   Patient is well, and has had no acute complaints or problems Denies any CP, SOB, ABD pain. We will continue therapy today.  Plan is to go Skilled nursing facility after hospital stay.  Objective: Vital signs in last 24 hours: Temp:  [98.2 F (36.8 C)-98.6 F (37 C)] 98.4 F (36.9 C) (06/20 0735) Pulse Rate:  [73-90] 90 (06/20 0735) Resp:  [16-18] 16 (06/20 0735) BP: (113-128)/(50-60) 125/50 (06/20 0735) SpO2:  [91 %-99 %] 94 % (06/20 0735)  Intake/Output from previous day: 06/19 0701 - 06/20 0700 In: 600 [P.O.:600] Out: 500 [Urine:500] Intake/Output this shift: No intake/output data recorded.  Recent Labs    08/23/20 0502  HGB 8.2*   Recent Labs    08/23/20 0502  WBC 9.8  RBC 2.77*  HCT 24.9*  PLT 110*   No results for input(s): NA, K, CL, CO2, BUN, CREATININE, GLUCOSE, CALCIUM in the last 72 hours.  No results for input(s): LABPT, INR in the last 72 hours.  EXAM General - Patient is Alert, Appropriate, and Oriented Extremity - Neurovascular intact Sensation intact distally Intact pulses distally Dorsiflexion/Plantar flexion intact No cellulitis present Compartment soft Dressing - CDI, prevena intact with drainage in canister Motor Function - intact, moving foot and toes well on exam.   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   Depression    Diabetes mellitus type 2, uncomplicated (HCC)    Diabetic retinopathy ([place])    Dupuytren contracture 2011   s/p surgery to LEFT hand   Frequent urinary tract infections    GERD (gastroesophageal reflux disease)     Grade I diastolic dysfunction    HTN (hypertension)    Hyperlipidemia, unspecified    Irritable bowel syndrome    Meningitis 11/2019   MI (myocardial infarction) ([place]) 1994   no stent   Neuromuscular disorder (HCC)    nerve pain in back and legs   Osteoporosis    Pneumonia 2021   RBBB    Sepsis ([place]) 11/2019   Skin cancer    TIA (transient ischemic attack) 2014   Vitamin D deficiency, unspecified    Wheezing     Assessment/Plan:   6 Days Post-Op Procedure(s) (LRB): HARDWARE REMOVAL (Left) TOTAL HIP ARTHROPLASTY ANTERIOR APPROACH (Left) Active Problems:   Status post total hip replacement, left  Estimated body mass index is 32.25 kg/m as calculated from the following:   Height as of this encounter: 4\' 9"  (1.448 m).   Weight as of this encounter: 67.6 kg. Advance diet Up with therapy Acute post op blood loss anemia -hemoglobin 8.2, s/p 2 units of PRBC 08/20/20.  Continue with iron supplement. Vital signs stable.   Pain controlled Work on [place] management to assist with discharge to skilled nursing facility today Covid test ordered  DVT Prophylaxis - Lovenox, TED hose, and SCDs left Weight-Bearing as tolerated to left leg   T. Rachelle Hora, PA-C [place] 08/25/2020, 8:05 AM

## 2020-08-26 LAB — CREATININE, SERUM
Creatinine, Ser: 1.31 mg/dL — ABNORMAL HIGH (ref 0.44–1.00)
GFR, Estimated: 43 mL/min — ABNORMAL LOW (ref >60)

## 2020-08-26 LAB — GLUCOSE, CAPILLARY
Glucose-Capillary: 139 mg/dL — ABNORMAL HIGH (ref 70–99)
Glucose-Capillary: 139 mg/dL — ABNORMAL HIGH (ref 70–99)
Glucose-Capillary: 154 mg/dL — ABNORMAL HIGH (ref 70–99)
Glucose-Capillary: 153 mg/dL — ABNORMAL HIGH (ref 70–99)

## 2020-08-26 MED ORDER — MAGNESIUM CITRATE PO SOLN
1.0000 | Freq: Once | ORAL | Status: AC
  Administered 2020-08-26: 1 via ORAL
  Filled 2020-08-26: qty 296

## 2020-08-26 MED ORDER — BISACODYL 10 MG RE SUPP
10.0000 mg | Freq: Once | RECTAL | Status: AC
  Administered 2020-08-26: 10 mg via RECTAL
  Filled 2020-08-26: qty 1

## 2020-08-26 MED ORDER — FLEET ENEMA 7-19 GM/118ML RE ENEM
1.0000 | ENEMA | Freq: Once | RECTAL | Status: AC
  Administered 2020-08-26: 1 via RECTAL

## 2020-08-26 NOTE — TOC Progression Note (Addendum)
Transition of Care [facility]) - Progression Note    Patient Details  Name: Andrea [name] MRN: [id number] Date of Birth: 08/28/1945  Transition of Care [facility]) CM/SW [place], RN Phone Number: 08/26/2020, 11:55 AM  Clinical Narrative:   Addendum:  patient waiting to have BM prior to discharging.  Patient has not yet had BM as of 16:13 despite interventions.  Will contact care team to determine discharge today.  patient can go to room 36A at [facility] today as per [place].  Patient will contact her daughter to let her know of transport.      Barriers to Discharge: Continued Medical Work up  Expected Discharge Plan and Services     Discharge Planning Services: CM Consult   Living arrangements for the past 2 months: Single Family Home Expected Discharge Date: 08/26/20                                     Social Determinants of Health (SDOH) Interventions    Readmission Risk Interventions Readmission Risk Prevention Plan 08/21/2020  Transportation Screening Complete  PCP or Specialist Appt within 5-7 Days Complete  Home Care Screening Complete  Medication Review (RN CM) Complete  Some recent data might be hidden

## 2020-08-26 NOTE — Progress Notes (Signed)
Physical Therapy Treatment Patient Details Name: Andrea [name] MRN: [id number] DOB: May 14, 1945 Today's Date: 08/26/2020    History of Present Illness 75 y/o female s/p L total hip 6/14, had low HGB with 2 unit transfusion POD1.  Recently pt has been very limited with mobility, apparently nearly w/c bound recently. PMH includes CAD, HF, COPD, DM type 2, MI, and TIA.    PT Comments    Pt continues to have pain, but overall showed good effort with moderate supine exercises.  She was able to show increased AROM and tolerance as compared to last time this PT saw her last week.  She remains functionally limited, now WBing orders changed to TTWBing with suspected acetabular fracture.   Follow Up Recommendations  SNF;Supervision/Assistance - 24 hour     Equipment Recommendations       Recommendations for Other Services       Precautions / Restrictions Precautions Precautions: Anterior Hip Restrictions Weight Bearing Restrictions: Yes LLE Weight Bearing: Touchdown weight bearing    Mobility  Bed Mobility               General bed mobility comments: deferred mobility 2/2 to new TTWBing orders, pt leaving today and pt pain    Transfers                    Ambulation/Gait                 Stairs             Wheelchair Mobility    Modified Rankin (Stroke Patients Only)       Balance                                            Cognition Arousal/Alertness: Awake/alert Behavior During Therapy: WFL for tasks assessed/performed Overall Cognitive Status: Within Functional Limits for tasks assessed                                        Exercises Total Joint Exercises Ankle Circles/Pumps: Strengthening;10 reps Quad Sets: Strengthening;15 reps Short Arc Quad: AROM;10 reps Heel Slides: AAROM;5 reps Hip ABduction/ADduction: AAROM;10 reps    General Comments        Pertinent Vitals/Pain Pain Assessment:  0-10 Pain Score: 7  Pain Location: L hip Pain Intervention(s): Limited activity within patient's tolerance    Home Living                      Prior Function            PT Goals (current goals can now be found in the care plan section) Progress towards PT goals: Progressing toward goals    Frequency    BID      PT Plan Current plan remains appropriate    Co-evaluation              AM-PAC PT "6 Clicks" Mobility   Outcome Measure  Help needed turning from your back to your side while in a flat bed without using bedrails?: A Lot Help needed moving from lying on your back to sitting on the side of a flat bed without using bedrails?: A Lot Help needed moving to and from a bed to a chair (including a wheelchair)?: Total  Help needed standing up from a chair using your arms (e.g., wheelchair or bedside chair)?: Total Help needed to walk in hospital room?: Total Help needed climbing 3-5 steps with a railing? : Total 6 Click Score: 8    End of Session   Activity Tolerance: Patient limited by pain Patient left: with bed alarm set;with call bell/phone within reach Nurse Communication: Mobility status PT Visit Diagnosis: Muscle weakness (generalized) (M62.81);Unsteadiness on feet (R26.81);Pain Pain - Right/Left: Left Pain - part of body: Hip     Time: 1005-1030 PT Time Calculation (min) (ACUTE ONLY): 25 min  Charges:  $Therapeutic Exercise: 23-37 mins                     Kreg Shropshire, DPT 08/26/2020, 12:51 PM

## 2020-08-26 NOTE — TOC Progression Note (Signed)
Transition of Care [facility]) - Progression Note    Patient Details  Name: Andrea [name] MRN: [id number] Date of Birth: 20-Jul-1945  Transition of Care [facility]) CM/SW [place], RN Phone Number: 08/26/2020, 9:27 AM  Clinical Narrative:   Patient will go to [facility] today after COVID screening.  Patient has [place] [phone].  Patient and family aware and amenable.      Barriers to Discharge: Continued Medical Work up  Expected Discharge Plan and Services     Discharge Planning Services: CM Consult   Living arrangements for the past 2 months: Single Family Home Expected Discharge Date: 08/26/20                                     Social Determinants of Health (SDOH) Interventions    Readmission Risk Interventions Readmission Risk Prevention Plan 08/21/2020  Transportation Screening Complete  PCP or Specialist Appt within 5-7 Days Complete  Home Care Screening Complete  Medication Review (RN CM) Complete  Some recent data might be hidden

## 2020-08-26 NOTE — Progress Notes (Addendum)
   Subjective: 7 Days Post-Op Procedure(s) (LRB): HARDWARE REMOVAL (Left) TOTAL HIP ARTHROPLASTY ANTERIOR APPROACH (Left) Patient reports pain as mild.   Patient is well, and has had no acute complaints or problems Denies any CP, SOB, ABD pain. We will continue therapy today.  Plan is to go Skilled nursing facility after hospital stay.  Objective: Vital signs in last 24 hours: Temp:  [97.7 F (36.5 C)-98.5 F (36.9 C)] 98.5 F (36.9 C) (06/21 0735) Pulse Rate:  [70-95] 95 (06/21 0735) Resp:  [16] 16 (06/21 0735) BP: (97-136)/(42-55) 129/52 (06/21 0735) SpO2:  [92 %-99 %] 92 % (06/21 0735)  Intake/Output from previous day: 06/20 0701 - 06/21 0700 In: -  Out: 175 [Urine:175] Intake/Output this shift: No intake/output data recorded.  No results for input(s): HGB in the last 72 hours.  No results for input(s): WBC, RBC, HCT, PLT in the last 72 hours.  Recent Labs    08/26/20 0527  CREATININE 1.31*    No results for input(s): LABPT, INR in the last 72 hours.  EXAM General - Patient is Alert, Appropriate, and Oriented Extremity - Neurovascular intact Sensation intact distally Intact pulses distally Dorsiflexion/Plantar flexion intact No cellulitis present Compartment soft Dressing - CDI, prevena intact with 100CC drainage in canister Motor Function - intact, moving foot and toes well on exam.   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   Depression    Diabetes mellitus type 2, uncomplicated (HCC)    Diabetic retinopathy ([place])    Dupuytren contracture 2011   s/p surgery to LEFT hand   Frequent urinary tract infections    GERD (gastroesophageal reflux disease)    Grade I diastolic dysfunction    HTN (hypertension)     Hyperlipidemia, unspecified    Irritable bowel syndrome    Meningitis 11/2019   MI (myocardial infarction) ([place]) 1994   no stent   Neuromuscular disorder (HCC)    nerve pain in back and legs   Osteoporosis    Pneumonia 2021   RBBB    Sepsis ([place]) 11/2019   Skin cancer    TIA (transient ischemic attack) 2014   Vitamin D deficiency, unspecified    Wheezing     Assessment/Plan:   7 Days Post-Op Procedure(s) (LRB): HARDWARE REMOVAL (Left) TOTAL HIP ARTHROPLASTY ANTERIOR APPROACH (Left) Active Problems:   Status post total hip replacement, left  Estimated body mass index is 32.25 kg/m as calculated from the following:   Height as of this encounter: 4\' 9"  (1.448 m).   Weight as of this encounter: 67.6 kg. Advance diet Up with therapy, TTWB LLE Acute post op blood loss anemia -, s/p 2 units of PRBC 08/20/20.  Continue with iron supplement. Vital signs stable.   Pain controlled Work on [place] management to assist with discharge to skilled nursing facility today Covid test ordered  DVT Prophylaxis - Lovenox, TED hose, and SCDs left Toe touch weight bearing LLE   T. Rachelle Hora, PA-C [place] 08/26/2020, 7:41 AM

## 2020-08-26 NOTE — Plan of Care (Signed)

## 2020-08-26 NOTE — TOC Progression Note (Signed)
Transition of Care [facility]) - Progression Note    Patient Details  Name: Andrea [name] MRN: [id number] Date of Birth: 01-Nov-1945  Transition of Care [facility]) CM/SW [place], RN Phone Number: 08/26/2020, 4:29 PM  Clinical Narrative:   Patient did not have BM today, thus is not able to go to [place].  Patient and facility notified, patient will go tomorrow.  TOC to follow to discharge.      Barriers to Discharge: Continued Medical Work up  Expected Discharge Plan and Services     Discharge Planning Services: CM Consult   Living arrangements for the past 2 months: Single Family Home Expected Discharge Date: 08/26/20                                     Social Determinants of Health (SDOH) Interventions    Readmission Risk Interventions Readmission Risk Prevention Plan 08/21/2020  Transportation Screening Complete  PCP or Specialist Appt within 5-7 Days Complete  Home Care Screening Complete  Medication Review (RN CM) Complete  Some recent data might be hidden

## 2020-08-27 ENCOUNTER — Encounter: Payer: Self-pay | Admitting: Orthopedic Surgery

## 2020-08-27 DIAGNOSIS — E113293 Type 2 diabetes mellitus with mild nonproliferative diabetic retinopathy without macular edema, bilateral: Secondary | ICD-10-CM | POA: Diagnosis not present

## 2020-08-27 DIAGNOSIS — S31104A Unspecified open wound of abdominal wall, left lower quadrant without penetration into peritoneal cavity, initial encounter: Secondary | ICD-10-CM | POA: Diagnosis not present

## 2020-08-27 DIAGNOSIS — R279 Unspecified lack of coordination: Secondary | ICD-10-CM | POA: Diagnosis not present

## 2020-08-27 DIAGNOSIS — L97422 Non-pressure chronic ulcer of left heel and midfoot with fat layer exposed: Secondary | ICD-10-CM | POA: Diagnosis not present

## 2020-08-27 DIAGNOSIS — I739 Peripheral vascular disease, unspecified: Secondary | ICD-10-CM | POA: Diagnosis not present

## 2020-08-27 DIAGNOSIS — E1159 Type 2 diabetes mellitus with other circulatory complications: Secondary | ICD-10-CM | POA: Diagnosis not present

## 2020-08-27 DIAGNOSIS — S91302D Unspecified open wound, left foot, subsequent encounter: Secondary | ICD-10-CM | POA: Diagnosis not present

## 2020-08-27 DIAGNOSIS — I259 Chronic ischemic heart disease, unspecified: Secondary | ICD-10-CM | POA: Diagnosis not present

## 2020-08-27 DIAGNOSIS — Z96642 Presence of left artificial hip joint: Secondary | ICD-10-CM | POA: Diagnosis not present

## 2020-08-27 DIAGNOSIS — Z8673 Personal history of transient ischemic attack (TIA), and cerebral infarction without residual deficits: Secondary | ICD-10-CM | POA: Diagnosis not present

## 2020-08-27 DIAGNOSIS — E1165 Type 2 diabetes mellitus with hyperglycemia: Secondary | ICD-10-CM | POA: Diagnosis not present

## 2020-08-27 DIAGNOSIS — I5022 Chronic systolic (congestive) heart failure: Secondary | ICD-10-CM | POA: Diagnosis not present

## 2020-08-27 DIAGNOSIS — M438X8 Other specified deforming dorsopathies, sacral and sacrococcygeal region: Secondary | ICD-10-CM | POA: Diagnosis not present

## 2020-08-27 DIAGNOSIS — M25552 Pain in left hip: Secondary | ICD-10-CM | POA: Diagnosis not present

## 2020-08-27 DIAGNOSIS — M533 Sacrococcygeal disorders, not elsewhere classified: Secondary | ICD-10-CM | POA: Diagnosis not present

## 2020-08-27 DIAGNOSIS — I999 Unspecified disorder of circulatory system: Secondary | ICD-10-CM | POA: Diagnosis not present

## 2020-08-27 DIAGNOSIS — R0902 Hypoxemia: Secondary | ICD-10-CM | POA: Diagnosis not present

## 2020-08-27 DIAGNOSIS — S51811A Laceration without foreign body of right forearm, initial encounter: Secondary | ICD-10-CM | POA: Diagnosis not present

## 2020-08-27 DIAGNOSIS — Z1159 Encounter for screening for other viral diseases: Secondary | ICD-10-CM | POA: Diagnosis not present

## 2020-08-27 DIAGNOSIS — R5381 Other malaise: Secondary | ICD-10-CM | POA: Diagnosis not present

## 2020-08-27 DIAGNOSIS — M810 Age-related osteoporosis without current pathological fracture: Secondary | ICD-10-CM | POA: Diagnosis not present

## 2020-08-27 DIAGNOSIS — L89899 Pressure ulcer of other site, unspecified stage: Secondary | ICD-10-CM | POA: Diagnosis not present

## 2020-08-27 DIAGNOSIS — Z471 Aftercare following joint replacement surgery: Secondary | ICD-10-CM | POA: Diagnosis not present

## 2020-08-27 DIAGNOSIS — S91302A Unspecified open wound, left foot, initial encounter: Secondary | ICD-10-CM | POA: Diagnosis not present

## 2020-08-27 DIAGNOSIS — K219 Gastro-esophageal reflux disease without esophagitis: Secondary | ICD-10-CM | POA: Diagnosis not present

## 2020-08-27 DIAGNOSIS — S30810A Abrasion of lower back and pelvis, initial encounter: Secondary | ICD-10-CM | POA: Diagnosis not present

## 2020-08-27 DIAGNOSIS — S51811D Laceration without foreign body of right forearm, subsequent encounter: Secondary | ICD-10-CM | POA: Diagnosis not present

## 2020-08-27 DIAGNOSIS — L89313 Pressure ulcer of right buttock, stage 3: Secondary | ICD-10-CM | POA: Diagnosis not present

## 2020-08-27 DIAGNOSIS — S51812D Laceration without foreign body of left forearm, subsequent encounter: Secondary | ICD-10-CM | POA: Diagnosis not present

## 2020-08-27 DIAGNOSIS — Z794 Long term (current) use of insulin: Secondary | ICD-10-CM | POA: Diagnosis not present

## 2020-08-27 DIAGNOSIS — R1030 Lower abdominal pain, unspecified: Secondary | ICD-10-CM | POA: Diagnosis not present

## 2020-08-27 DIAGNOSIS — E119 Type 2 diabetes mellitus without complications: Secondary | ICD-10-CM | POA: Diagnosis not present

## 2020-08-27 DIAGNOSIS — L089 Local infection of the skin and subcutaneous tissue, unspecified: Secondary | ICD-10-CM | POA: Diagnosis not present

## 2020-08-27 DIAGNOSIS — L98491 Non-pressure chronic ulcer of skin of other sites limited to breakdown of skin: Secondary | ICD-10-CM | POA: Diagnosis not present

## 2020-08-27 DIAGNOSIS — J449 Chronic obstructive pulmonary disease, unspecified: Secondary | ICD-10-CM | POA: Diagnosis not present

## 2020-08-27 DIAGNOSIS — I2510 Atherosclerotic heart disease of native coronary artery without angina pectoris: Secondary | ICD-10-CM | POA: Diagnosis not present

## 2020-08-27 DIAGNOSIS — Z4889 Encounter for other specified surgical aftercare: Secondary | ICD-10-CM | POA: Diagnosis not present

## 2020-08-27 DIAGNOSIS — J9601 Acute respiratory failure with hypoxia: Secondary | ICD-10-CM | POA: Diagnosis not present

## 2020-08-27 DIAGNOSIS — R6889 Other general symptoms and signs: Secondary | ICD-10-CM | POA: Diagnosis not present

## 2020-08-27 DIAGNOSIS — S30810D Abrasion of lower back and pelvis, subsequent encounter: Secondary | ICD-10-CM | POA: Diagnosis not present

## 2020-08-27 DIAGNOSIS — E875 Hyperkalemia: Secondary | ICD-10-CM | POA: Diagnosis not present

## 2020-08-27 DIAGNOSIS — I10 Essential (primary) hypertension: Secondary | ICD-10-CM | POA: Diagnosis not present

## 2020-08-27 DIAGNOSIS — E785 Hyperlipidemia, unspecified: Secondary | ICD-10-CM | POA: Diagnosis not present

## 2020-08-27 DIAGNOSIS — Z743 Need for continuous supervision: Secondary | ICD-10-CM | POA: Diagnosis not present

## 2020-08-27 DIAGNOSIS — M6281 Muscle weakness (generalized): Secondary | ICD-10-CM | POA: Diagnosis not present

## 2020-08-27 LAB — GLUCOSE, CAPILLARY
Glucose-Capillary: 113 mg/dL — ABNORMAL HIGH (ref 70–99)
Glucose-Capillary: 204 mg/dL — ABNORMAL HIGH (ref 70–99)
Glucose-Capillary: 122 mg/dL — ABNORMAL HIGH (ref 70–99)

## 2020-08-27 NOTE — Progress Notes (Signed)
   Subjective: 8 Days Post-Op Procedure(s) (LRB): HARDWARE REMOVAL (Left) TOTAL HIP ARTHROPLASTY ANTERIOR APPROACH (Left) Patient reports pain as mild.   Patient is well, and has had no acute complaints or problems Denies any CP, SOB, ABD pain. + BM last night We will continue therapy today.  Plan is to go Skilled nursing facility after hospital stay.  Objective: Vital signs in last 24 hours: Temp:  [97.9 F (36.6 C)-99 F (37.2 C)] 97.9 F (36.6 C) (06/22 0737) Pulse Rate:  [72-93] 72 (06/22 0737) Resp:  [14-16] 16 (06/22 0737) BP: (106-135)/(48-55) 110/55 (06/22 0737) SpO2:  [93 %-99 %] 97 % (06/22 0737)  Intake/Output from previous day: 06/21 0701 - 06/22 0700 In: -  Out: 500 [Urine:500] Intake/Output this shift: No intake/output data recorded.  No results for input(s): HGB in the last 72 hours.  No results for input(s): WBC, RBC, HCT, PLT in the last 72 hours.  Recent Labs    08/26/20 0527  CREATININE 1.31*    No results for input(s): LABPT, INR in the last 72 hours.  EXAM General - Patient is Alert, Appropriate, and Oriented Extremity - Neurovascular intact Sensation intact distally Intact pulses distally Dorsiflexion/Plantar flexion intact No cellulitis present Compartment soft Dressing - CDI, prevena intact with 100CC drainage in canister Motor Function - intact, moving foot and toes well on exam.   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   Depression    Diabetes mellitus type 2, uncomplicated (HCC)    Diabetic retinopathy ([place])    Dupuytren contracture 2011   s/p surgery to LEFT hand   Frequent urinary tract infections    GERD (gastroesophageal reflux disease)    Grade I diastolic dysfunction    HTN  (hypertension)    Hyperlipidemia, unspecified    Irritable bowel syndrome    Meningitis 11/2019   MI (myocardial infarction) ([place]) 1994   no stent   Neuromuscular disorder (HCC)    nerve pain in back and legs   Osteoporosis    Pneumonia 2021   RBBB    Sepsis ([place]) 11/2019   Skin cancer    TIA (transient ischemic attack) 2014   Vitamin D deficiency, unspecified    Wheezing     Assessment/Plan:   8 Days Post-Op Procedure(s) (LRB): HARDWARE REMOVAL (Left) TOTAL HIP ARTHROPLASTY ANTERIOR APPROACH (Left) Active Problems:   Status post total hip replacement, left  Estimated body mass index is 32.25 kg/m as calculated from the following:   Height as of this encounter: 4\' 9"  (1.448 m).   Weight as of this encounter: 67.6 kg. Advance diet Up with therapy, TTWB LLE Acute post op blood loss anemia -, s/p 2 units of PRBC 08/20/20.  Continue with iron supplement. Vital signs stable.   Pain controlled Care management to assist with discharge to skilled nursing facility today Covid test reordered  DVT Prophylaxis - Lovenox, TED hose, and SCDs left Toe touch weight bearing LLE   T. Rachelle Hora, PA-C [place] 08/27/2020, 7:50 AM

## 2020-08-27 NOTE — Progress Notes (Signed)
EMS here to transport pt. 

## 2020-08-27 NOTE — TOC Progression Note (Signed)
Transition of Care [facility]) - Progression Note    Patient Details  Name: Andrea [name] MRN: [id number] Date of Birth: 03/08/1946  Transition of Care [facility]) CM/SW [place], RN Phone Number: 08/27/2020, 10:37 AM  Clinical Narrative:   Patient had BM and is awaiting transfer to [facility].  TOC contacted [facility], awaiting call back at this time to arrange transfer.      Barriers to Discharge: Continued Medical Work up  Expected Discharge Plan and Services     Discharge Planning Services: CM Consult   Living arrangements for the past 2 months: Single Family Home Expected Discharge Date: 08/27/20                                     Social Determinants of Health (SDOH) Interventions    Readmission Risk Interventions Readmission Risk Prevention Plan 08/21/2020  Transportation Screening Complete  PCP or Specialist Appt within 5-7 Days Complete  Home Care Screening Complete  Medication Review (RN CM) Complete  Some recent data might be hidden

## 2020-08-27 NOTE — Progress Notes (Signed)
Physical Therapy Treatment Patient Details Name: Andrea [name] MRN: [id number] DOB: 12-18-1945 Today's Date: 08/27/2020    History of Present Illness 75 y/o female s/p L total hip 6/14, had low HGB with 2 unit transfusion POD1.  Recently pt has been very limited with mobility, apparently nearly w/c bound recently. PMH includes CAD, HF, COPD, DM type 2, MI, and TIA.    PT Comments    Pt showed good effort despite continued significant pain even with relatively conservative and NWBing supine exercises.  She was eager and willing to do all she can but did not have great tolerance and needed consistent encouragement.  She tolerated increased reps and was able to show increased hip & knee flexion with heel slides though again needed some AAROM with most exercises.      Follow Up Recommendations  SNF;Supervision/Assistance - 24 hour     Equipment Recommendations  None recommended by PT    Recommendations for Other Services       Precautions / Restrictions Precautions Precautions: Anterior Hip Restrictions LLE Weight Bearing: Touchdown weight bearing    Mobility  Bed Mobility               General bed mobility comments: Pt to d/c soon, deferred getting out of bed this date    Transfers                    Ambulation/Gait                 Stairs             Wheelchair Mobility    Modified Rankin (Stroke Patients Only)       Balance                                            Cognition Arousal/Alertness: Awake/alert Behavior During Therapy: WFL for tasks assessed/performed Overall Cognitive Status: Within Functional Limits for tasks assessed                                        Exercises Total Joint Exercises Ankle Circles/Pumps: Strengthening;20 reps Quad Sets: Strengthening;15 reps Gluteal Sets: AROM;10 reps Short Arc Quad: Strengthening;AROM;15 reps Heel Slides: AAROM;5 reps (w/o any WBing  pressure through hip) Hip ABduction/ADduction: AAROM;10 reps    General Comments General comments (skin integrity, edema, etc.): Pt somewhat tearful when discussing that in being TTWBing she will not be able to do a lot of walking, etc for a while w/o a lot of assistance      Pertinent Vitals/Pain Pain Assessment: 0-10 Pain Score: 7     Home Living                      Prior Function            PT Goals (current goals can now be found in the care plan section) Progress towards PT goals: Progressing toward goals    Frequency    BID      PT Plan Current plan remains appropriate    Co-evaluation              AM-PAC PT "6 Clicks" Mobility   Outcome Measure  Help needed turning from your back to your side while in a  flat bed without using bedrails?: A Lot Help needed moving from lying on your back to sitting on the side of a flat bed without using bedrails?: A Lot Help needed moving to and from a bed to a chair (including a wheelchair)?: Total Help needed standing up from a chair using your arms (e.g., wheelchair or bedside chair)?: Total Help needed to walk in hospital room?: Total Help needed climbing 3-5 steps with a railing? : Total 6 Click Score: 8    End of Session Equipment Utilized During Treatment: Gait belt Activity Tolerance: Patient limited by pain Patient left: with bed alarm set;with call bell/phone within reach   PT Visit Diagnosis: Muscle weakness (generalized) (M62.81);Unsteadiness on feet (R26.81);Pain Pain - Right/Left: Left Pain - part of body: Hip     Time: 6144-3154 PT Time Calculation (min) (ACUTE ONLY): 17 min  Charges:  $Therapeutic Exercise: 8-22 mins                     Kreg Shropshire, DPT 08/27/2020, 1:54 PM

## 2020-08-27 NOTE — TOC Progression Note (Signed)
Transition of Care [facility]) - Progression Note    Patient Details  Name: Andrea [name] MRN: [id number] Date of Birth: 1945/06/16  Transition of Care [facility]) CM/SW [place], RN Phone Number: 08/27/2020, 4:22 PM  Clinical Narrative:   [place] care answered at 1600, stating patient can transfer today.  [facility] will pick up patient this evening.  Care team, patient and family aware.        Barriers to Discharge: Continued Medical Work up  Expected Discharge Plan and Services     Discharge Planning Services: CM Consult   Living arrangements for the past 2 months: Single Family Home Expected Discharge Date: 08/27/20                                     Social Determinants of Health (SDOH) Interventions    Readmission Risk Interventions Readmission Risk Prevention Plan 08/21/2020  Transportation Screening Complete  PCP or Specialist Appt within 5-7 Days Complete  Home Care Screening Complete  Medication Review (RN CM) Complete  Some recent data might be hidden

## 2020-08-27 NOTE — Progress Notes (Signed)
Report called to [facility].

## 2020-08-28 DIAGNOSIS — Z96642 Presence of left artificial hip joint: Secondary | ICD-10-CM | POA: Diagnosis not present

## 2020-08-28 DIAGNOSIS — R5381 Other malaise: Secondary | ICD-10-CM | POA: Diagnosis not present

## 2020-08-28 DIAGNOSIS — I2510 Atherosclerotic heart disease of native coronary artery without angina pectoris: Secondary | ICD-10-CM | POA: Diagnosis not present

## 2020-08-28 DIAGNOSIS — E1165 Type 2 diabetes mellitus with hyperglycemia: Secondary | ICD-10-CM | POA: Diagnosis not present

## 2020-08-29 DIAGNOSIS — E1159 Type 2 diabetes mellitus with other circulatory complications: Secondary | ICD-10-CM | POA: Diagnosis not present

## 2020-08-29 DIAGNOSIS — S31104A Unspecified open wound of abdominal wall, left lower quadrant without penetration into peritoneal cavity, initial encounter: Secondary | ICD-10-CM | POA: Diagnosis not present

## 2020-08-29 DIAGNOSIS — S91302A Unspecified open wound, left foot, initial encounter: Secondary | ICD-10-CM | POA: Diagnosis not present

## 2020-08-29 DIAGNOSIS — S51811A Laceration without foreign body of right forearm, initial encounter: Secondary | ICD-10-CM | POA: Diagnosis not present

## 2020-09-02 ENCOUNTER — Ambulatory Visit: Payer: Medicare Other | Admitting: Family Medicine

## 2020-09-02 DIAGNOSIS — Z96642 Presence of left artificial hip joint: Secondary | ICD-10-CM | POA: Diagnosis not present

## 2020-09-05 DIAGNOSIS — S91302A Unspecified open wound, left foot, initial encounter: Secondary | ICD-10-CM | POA: Diagnosis not present

## 2020-09-05 DIAGNOSIS — L89313 Pressure ulcer of right buttock, stage 3: Secondary | ICD-10-CM | POA: Diagnosis not present

## 2020-09-05 DIAGNOSIS — S51811A Laceration without foreign body of right forearm, initial encounter: Secondary | ICD-10-CM | POA: Diagnosis not present

## 2020-09-05 DIAGNOSIS — S31104A Unspecified open wound of abdominal wall, left lower quadrant without penetration into peritoneal cavity, initial encounter: Secondary | ICD-10-CM | POA: Diagnosis not present

## 2020-09-09 DIAGNOSIS — S91302D Unspecified open wound, left foot, subsequent encounter: Secondary | ICD-10-CM | POA: Diagnosis not present

## 2020-09-09 DIAGNOSIS — S51811A Laceration without foreign body of right forearm, initial encounter: Secondary | ICD-10-CM | POA: Diagnosis not present

## 2020-09-09 DIAGNOSIS — L89313 Pressure ulcer of right buttock, stage 3: Secondary | ICD-10-CM | POA: Diagnosis not present

## 2020-09-09 DIAGNOSIS — S31104A Unspecified open wound of abdominal wall, left lower quadrant without penetration into peritoneal cavity, initial encounter: Secondary | ICD-10-CM | POA: Diagnosis not present

## 2020-09-16 DIAGNOSIS — L89313 Pressure ulcer of right buttock, stage 3: Secondary | ICD-10-CM | POA: Diagnosis not present

## 2020-09-16 DIAGNOSIS — S51811A Laceration without foreign body of right forearm, initial encounter: Secondary | ICD-10-CM | POA: Diagnosis not present

## 2020-09-16 DIAGNOSIS — S91302D Unspecified open wound, left foot, subsequent encounter: Secondary | ICD-10-CM | POA: Diagnosis not present

## 2020-09-16 DIAGNOSIS — E1159 Type 2 diabetes mellitus with other circulatory complications: Secondary | ICD-10-CM | POA: Diagnosis not present

## 2020-09-17 DIAGNOSIS — R1030 Lower abdominal pain, unspecified: Secondary | ICD-10-CM | POA: Diagnosis not present

## 2020-09-17 DIAGNOSIS — M533 Sacrococcygeal disorders, not elsewhere classified: Secondary | ICD-10-CM | POA: Diagnosis not present

## 2020-09-17 DIAGNOSIS — M438X8 Other specified deforming dorsopathies, sacral and sacrococcygeal region: Secondary | ICD-10-CM | POA: Diagnosis not present

## 2020-09-19 DIAGNOSIS — S91302D Unspecified open wound, left foot, subsequent encounter: Secondary | ICD-10-CM | POA: Diagnosis not present

## 2020-09-19 DIAGNOSIS — L89313 Pressure ulcer of right buttock, stage 3: Secondary | ICD-10-CM | POA: Diagnosis not present

## 2020-09-19 DIAGNOSIS — S51811A Laceration without foreign body of right forearm, initial encounter: Secondary | ICD-10-CM | POA: Diagnosis not present

## 2020-09-19 DIAGNOSIS — S30810A Abrasion of lower back and pelvis, initial encounter: Secondary | ICD-10-CM | POA: Diagnosis not present

## 2020-09-23 DIAGNOSIS — S51811D Laceration without foreign body of right forearm, subsequent encounter: Secondary | ICD-10-CM | POA: Diagnosis not present

## 2020-09-23 DIAGNOSIS — L89313 Pressure ulcer of right buttock, stage 3: Secondary | ICD-10-CM | POA: Diagnosis not present

## 2020-09-23 DIAGNOSIS — S91302D Unspecified open wound, left foot, subsequent encounter: Secondary | ICD-10-CM | POA: Diagnosis not present

## 2020-09-23 DIAGNOSIS — S30810D Abrasion of lower back and pelvis, subsequent encounter: Secondary | ICD-10-CM | POA: Diagnosis not present

## 2020-09-30 DIAGNOSIS — L89313 Pressure ulcer of right buttock, stage 3: Secondary | ICD-10-CM | POA: Diagnosis not present

## 2020-09-30 DIAGNOSIS — E1159 Type 2 diabetes mellitus with other circulatory complications: Secondary | ICD-10-CM | POA: Diagnosis not present

## 2020-09-30 DIAGNOSIS — S91302D Unspecified open wound, left foot, subsequent encounter: Secondary | ICD-10-CM | POA: Diagnosis not present

## 2020-09-30 DIAGNOSIS — S51812D Laceration without foreign body of left forearm, subsequent encounter: Secondary | ICD-10-CM | POA: Diagnosis not present

## 2020-10-01 ENCOUNTER — Other Ambulatory Visit: Payer: Medicare Other

## 2020-10-01 DIAGNOSIS — M25552 Pain in left hip: Secondary | ICD-10-CM | POA: Diagnosis not present

## 2020-10-01 DIAGNOSIS — Z96642 Presence of left artificial hip joint: Secondary | ICD-10-CM | POA: Diagnosis not present

## 2020-10-02 DIAGNOSIS — L97422 Non-pressure chronic ulcer of left heel and midfoot with fat layer exposed: Secondary | ICD-10-CM | POA: Diagnosis not present

## 2020-10-02 DIAGNOSIS — L98491 Non-pressure chronic ulcer of skin of other sites limited to breakdown of skin: Secondary | ICD-10-CM | POA: Diagnosis not present

## 2020-10-02 DIAGNOSIS — L89313 Pressure ulcer of right buttock, stage 3: Secondary | ICD-10-CM | POA: Diagnosis not present

## 2020-10-07 ENCOUNTER — Other Ambulatory Visit: Payer: Self-pay | Admitting: Family Medicine

## 2020-10-07 ENCOUNTER — Ambulatory Visit: Payer: Medicare Other | Admitting: Cardiovascular Disease

## 2020-10-07 DIAGNOSIS — S51812D Laceration without foreign body of left forearm, subsequent encounter: Secondary | ICD-10-CM | POA: Diagnosis not present

## 2020-10-07 DIAGNOSIS — L89313 Pressure ulcer of right buttock, stage 3: Secondary | ICD-10-CM | POA: Diagnosis not present

## 2020-10-07 DIAGNOSIS — S91302D Unspecified open wound, left foot, subsequent encounter: Secondary | ICD-10-CM | POA: Diagnosis not present

## 2020-10-07 DIAGNOSIS — N3281 Overactive bladder: Secondary | ICD-10-CM

## 2020-10-07 DIAGNOSIS — E1159 Type 2 diabetes mellitus with other circulatory complications: Secondary | ICD-10-CM | POA: Diagnosis not present

## 2020-10-09 ENCOUNTER — Other Ambulatory Visit: Payer: Self-pay | Admitting: Cardiovascular Disease

## 2020-10-09 ENCOUNTER — Other Ambulatory Visit: Payer: Self-pay | Admitting: Family Medicine

## 2020-10-09 DIAGNOSIS — I10 Essential (primary) hypertension: Secondary | ICD-10-CM

## 2020-10-09 NOTE — Telephone Encounter (Signed)
Requested Prescriptions  Pending Prescriptions Disp Refills  . losartan (COZAAR) 25 MG tablet [Pharmacy Med Name: LOSARTAN POTASSIUM 25 MG TAB] 90 tablet 0    Sig: TAKE 1 TABLET BY MOUTH EVERY DAY     Cardiovascular:  Angiotensin Receptor Blockers Failed - 10/09/2020  1:48 AM      Failed - Cr in normal range and within 180 days    Creat  Date Value Ref Range Status  12/16/2016 1.08 (H) 0.60 - 0.93 mg/dL Final    Comment:    For patients >75 years of age, the reference limit for Creatinine is approximately 13% higher for people identified as African-American. .    Creatinine, Ser  Date Value Ref Range Status  08/26/2020 1.31 (H) 0.44 - 1.00 mg/dL Final         Passed - K in normal range and within 180 days    Potassium  Date Value Ref Range Status  08/20/2020 4.8 3.5 - 5.1 mmol/L Final  01/01/2014 4.2 3.5 - 5.1 mmol/L Final         Passed - Patient is not pregnant      Passed - Last BP in normal range    BP Readings from Last 1 Encounters:  08/27/20 (!) 136/54         Passed - Valid encounter within last 6 months    Recent Outpatient Visits          4 months ago Localized swelling of left lower extremity   [facility] [place], [place], PA-C   4 months ago 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 edema, with long-term current use of insulin [facility])   [facility] [place], Anderson Malta M, [place]   9 months ago SOB (shortness of breath)   [facility], [place], PA-C   12 months ago 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 edema, with long-term current use of insulin [facility])   [place], [place], [place]   1 year ago Type 2 diabetes mellitus with diabetic peripheral angiopathy without gangrene, with long-term current use of insulin [facility])   [facility] [place],  Clearnce Sorrel, [place]      Future Appointments            In 1 month Gollan, Kathlene November, MD [facility], [facility]

## 2020-10-14 DIAGNOSIS — S91302D Unspecified open wound, left foot, subsequent encounter: Secondary | ICD-10-CM | POA: Diagnosis not present

## 2020-10-14 DIAGNOSIS — L089 Local infection of the skin and subcutaneous tissue, unspecified: Secondary | ICD-10-CM | POA: Diagnosis not present

## 2020-10-14 DIAGNOSIS — L89313 Pressure ulcer of right buttock, stage 3: Secondary | ICD-10-CM | POA: Diagnosis not present

## 2020-10-14 DIAGNOSIS — S51812D Laceration without foreign body of left forearm, subsequent encounter: Secondary | ICD-10-CM | POA: Diagnosis not present

## 2020-10-15 ENCOUNTER — Telehealth: Payer: Self-pay | Admitting: *Deleted

## 2020-10-15 NOTE — Chronic Care Management (AMB) (Signed)
  Chronic Care Management   Outreach Note  10/15/2020 Name: Andrea [name] MRN: [id number] DOB: Oct 31, 1945  Andrea [name] is a 75 y.o. year old female who is a primary care patient of Brita Romp, Dionne Bucy, MD. I reached out to Andrea [name] by phone today in response to a referral sent by Andrea [name]'s PCP Virginia Crews, MD     An unsuccessful telephone outreach was attempted today. The patient was referred to the case management team for assistance with care management and care coordination.   Follow Up Plan: A HIPAA compliant phone message was left for the patient providing contact information and requesting a return call.  If patient returns call to provider office, please advise to call Embedded Care Management Care Guide Dana Dorner at [place], [place] [facility]  Direct Dial: [phone]

## 2020-10-19 ENCOUNTER — Other Ambulatory Visit: Payer: Self-pay | Admitting: Family Medicine

## 2020-10-19 DIAGNOSIS — K219 Gastro-esophageal reflux disease without esophagitis: Secondary | ICD-10-CM

## 2020-10-19 NOTE — Telephone Encounter (Signed)
Requested Prescriptions  Pending Prescriptions Disp Refills  . omeprazole (PRILOSEC) 40 MG capsule [Pharmacy Med Name: OMEPRAZOLE DR 40 MG CAPSULE] 90 capsule 0    Sig: TAKE 1 CAPSULE BY MOUTH EVERY DAY     Gastroenterology: Proton Pump Inhibitors Passed - 10/19/2020 12:55 AM      Passed - Valid encounter within last 12 months    Recent Outpatient Visits          4 months ago Localized swelling of left lower extremity   [facility] [place], [place], PA-C   5 months ago 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 edema, with long-term current use of insulin [facility])   [facility] [name] M, [place]   9 months ago SOB (shortness of breath)   [facility], [place], PA-C   1 year ago 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 edema, with long-term current use of insulin [facility])   [place], [place], [place]   1 year ago Type 2 diabetes mellitus with diabetic peripheral angiopathy without gangrene, with long-term current use of insulin [facility])   [facility] [place], Clearnce Sorrel, [place]      Future Appointments            In 1 month Gollan, Kathlene November, MD [facility], [facility]

## 2020-10-21 ENCOUNTER — Encounter: Payer: Self-pay | Admitting: Podiatry

## 2020-10-21 ENCOUNTER — Ambulatory Visit: Payer: Medicare Other | Admitting: Podiatry

## 2020-10-21 DIAGNOSIS — L89899 Pressure ulcer of other site, unspecified stage: Secondary | ICD-10-CM | POA: Diagnosis not present

## 2020-10-21 DIAGNOSIS — S51812D Laceration without foreign body of left forearm, subsequent encounter: Secondary | ICD-10-CM | POA: Diagnosis not present

## 2020-10-21 DIAGNOSIS — Z794 Long term (current) use of insulin: Secondary | ICD-10-CM | POA: Diagnosis not present

## 2020-10-21 DIAGNOSIS — E113293 Type 2 diabetes mellitus with mild nonproliferative diabetic retinopathy without macular edema, bilateral: Secondary | ICD-10-CM | POA: Diagnosis not present

## 2020-10-21 DIAGNOSIS — L089 Local infection of the skin and subcutaneous tissue, unspecified: Secondary | ICD-10-CM | POA: Diagnosis not present

## 2020-10-21 DIAGNOSIS — I999 Unspecified disorder of circulatory system: Secondary | ICD-10-CM | POA: Diagnosis not present

## 2020-10-21 DIAGNOSIS — L89313 Pressure ulcer of right buttock, stage 3: Secondary | ICD-10-CM | POA: Diagnosis not present

## 2020-10-21 MED ORDER — DOXYCYCLINE HYCLATE 100 MG PO TABS: 100.0000 mg | ORAL_TABLET | Freq: Two times a day (BID) | ORAL | 0 refills | Status: DC

## 2020-10-21 NOTE — Telephone Encounter (Signed)
Patient went to see Dr. Posey Pronto at [place] [facility] [place].  Dr. Posey Pronto is concerned about a wound on patients heel not healing for  2 months. Dr. Posey Pronto feels like it's a circulation issue and wants patient to be seen.  Patient was last seen 05/13/2020 FB and has a follow up 04/2021. Please advise.

## 2020-10-21 NOTE — Progress Notes (Signed)
Subjective:  Patient ID: Andrea [name], female    DOB: November 15, 1945,  MRN: [id number]  Chief Complaint  Patient presents with   Wound Check    Left heel wound  Since June 14     75 y.o. female presents for wound care.  Patient presents with complaint of left posterior heel wound necrotic base.  Patient states been going on since June 14.  Patient had it from a cam boot pressure injury.  She states that she has a cam boot from her hip surgery.  She has been dealing with this wound has not gotten better has been stable.  She has completed course of antibiotic she will get it further evaluated.  She has not seen anyone else prior to seeing me.  She denies any other acute complaints.  She has a history of peripheral vascular disease.  She has been applying Santyl wet-to-dry.  She has nursing coming at home.  Review of Systems: Negative except as noted in the HPI. Denies N/V/F/Ch.  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   Depression    Diabetes mellitus type 2, uncomplicated ([place])    Diabetic retinopathy ([place])    Dupuytren contracture 2011   s/p surgery to LEFT hand   Frequent urinary tract infections    GERD (gastroesophageal reflux disease)    Grade I diastolic dysfunction    HTN (hypertension)    Hyperlipidemia, unspecified    Irritable bowel syndrome    Meningitis 11/2019   MI (myocardial infarction) ([place]) 1994   no stent   Neuromuscular disorder (HCC)    nerve pain in back and legs   Osteoporosis    Pneumonia 2021   RBBB    Sepsis ([place]) 11/2019   Skin cancer    TIA (transient ischemic attack) 2014   Vitamin D deficiency, unspecified    Wheezing     Current Outpatient Medications:    Alcohol Swabs (ALCOHOL PREP) PADS, Use  twice daily prior to SQ injection of insulin to clean skin, Disp: 100 each, Rfl: 6   atorvastatin (LIPITOR) 80 MG tablet, TAKE 1 TABLET BY MOUTH EVERY DAY, Disp: 90 tablet, Rfl: 0   Barberry-Oreg Grape-Goldenseal (BERBERINE COMPLEX PO), Take 1 tablet by mouth 2 (two) times daily with a meal., Disp: , Rfl:    Calcium Carbonate-Vitamin D 600-400 MG-UNIT tablet, Take 1 tablet by mouth 2 (two) times daily., Disp: , Rfl:    Cyanocobalamin (B-12) 1000 MCG CAPS, Take 1,000 mcg by mouth daily. , Disp: , Rfl:    docusate sodium (COLACE) 100 MG capsule, Take 1 capsule (100 mg total) by mouth 2 (two) times daily., Disp: 10 capsule, Rfl: 0   enoxaparin (LOVENOX) 40 MG/0.4ML injection, Inject 0.4 mLs (40 mg total) into the skin daily for 14 days., Disp: 5.6 mL, Rfl: 0   furosemide (LASIX) 20 MG tablet, Take 1 tablet (20 mg total) by mouth daily., Disp: 90 tablet, Rfl: 1   Hypertonic Nasal Wash (SINUS RINSE NA), Place 1 application into the nose at bedtime., Disp: , Rfl:    insulin glargine (LANTUS) 100 UNIT/ML injection, Inject 65 Units into the skin daily., Disp: , Rfl:    Insulin Pen  Needle (TRUEPLUS PEN NEEDLES) 31G X 6 MM MISC, Use with lantus two times daily., Disp: 100 each, Rfl: 3   INSULIN SYRINGE 1CC/29G 29G X 1/2" 1 ML MISC, For lantus injections twice daily, Disp: 200 each, Rfl: 1   losartan (COZAAR) 25 MG tablet, TAKE 1 TABLET BY MOUTH EVERY DAY, Disp: 90 tablet, Rfl: 0   metoprolol succinate (TOPROL-XL) 25 MG 24 hr tablet, TAKE 1 TABLET BY MOUTH EVERY DAY, Disp: 90 tablet, Rfl: 3   Multiple Vitamins-Minerals (PRESERVISION AREDS 2 PO), Take 1 tablet by mouth in the morning and at bedtime., Disp: , Rfl:    Omega-3 1000 MG CAPS, Take 1,000 mg by mouth daily., Disp: , Rfl:    omeprazole (PRILOSEC) 40 MG capsule, TAKE 1 CAPSULE BY MOUTH EVERY DAY, Disp: 90 capsule, Rfl: 0   OneTouch Delica Lancets 99991111 MISC, To check blood sugar daily  DX: E11.9, Disp: 100 each, Rfl: 5   ONETOUCH VERIO test strip, TO  CHECK BLOOD SUGAR ONCE DAILY., Disp: 100 strip, Rfl: 11   oxybutynin (DITROPAN-XL) 10 MG 24 hr tablet, TAKE 1 TABLET BY MOUTH EVERYDAY AT BEDTIME, Disp: 90 tablet, Rfl: 0   oxyCODONE (OXY IR/ROXICODONE) 5 MG immediate release tablet, Take 1-2 tablets (5-10 mg total) by mouth every 4 (four) hours as needed for moderate pain (pain score 4-6)., Disp: 30 tablet, Rfl: 0   potassium chloride (KLOR-CON) 10 MEQ tablet, Take 1 tablet (10 mEq total) by mouth daily., Disp: 90 tablet, Rfl: 1   traMADol (ULTRAM) 50 MG tablet, Take 1 tablet (50 mg total) by mouth every 6 (six) hours as needed., Disp: 30 tablet, Rfl: 0   TRULICITY 1.5 0000000 SOPN, INJECT 1.'5MG'$  INTO THE SKIN ONCE A WEEK AS DIRECTED, Disp: 2 mL, Rfl: 2   Vitamin D, Ergocalciferol, (DRISDOL) 1.25 MG (50000 UNIT) CAPS capsule, TAKE ONE CAPSULE BY MOUTH ONCE WEEKLY ON THE SAME DAY EACH WEEK (ON FRIDAYS), Disp: 12 capsule, Rfl: 1  Social History   Tobacco Use  Smoking Status Former   Packs/day: 1.00   Years: 30.00   Pack years: 30.00   Types: Cigarettes   Quit date: 07/17/2012   Years since quitting: 8.2  Smokeless Tobacco Never  Tobacco Comments        Allergies  Allergen Reactions   Metformin And Related Diarrhea   Sulfa Antibiotics Rash   Objective:  There were no vitals filed for this visit. There is no height or weight on file to calculate BMI. Constitutional Well developed. Well nourished.  Vascular Dorsalis pedis pulses non palpable bilaterally. Posterior tibial pulses non palpable bilaterally. Capillary refill normal to all digits.  No cyanosis or clubbing noted. Pedal hair growth normal.  Neurologic Normal speech. Oriented to person, place, and time. Protective sensation absent  Dermatologic Wound Location: Left posterior leg wound with necrotic eschar base unstageable.  Mild erythema circumferential around the wound.  No streaking cellulitis noted no probing down to deep tissue.  No exposure of Achilles tendon. Wound  Base: Mixed Granular/Fibrotic Peri-wound: Macerated Exudate: Scant/small amount Serosanguinous exudate Wound Measurements: -See below  Orthopedic: No pain to palpation either foot.   Radiographs: None Assessment:   1. Vascular abnormality   2. 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 edema, with long-term current use of insulin (HCC)   3. Pressure ulcer of left leg, unspecified pressure ulcer stage    Plan:  Patient was evaluated and treated and all questions answered.  Ulcer left  posterior heel wound with necrotic base unstageable -Debridement as below. -Dressed with Santyl wet-to-dry, DSD. -Continue off-loading with surgical shoe. -Prior ABIs PVRs were reviewed which were normal however given the claudication type of pain that she is describing in setting of very arterial heavy ulcer I believe patient will benefit from new sets of ABIs for comparison. -ABI PVR was ordered  Procedure: Excisional Debridement of Wound Tool: Sharp chisel blade/tissue nipper Rationale: Removal of non-viable soft tissue from the wound to promote healing.  Anesthesia: none Pre-Debridement Wound Measurements: 2.5 cm x 1.5 cm x unstageable cm  Post-Debridement Wound Measurements: 2.7 cm x 1.6 cm x unstageable cm  Type of Debridement: Sharp Excisional Tissue Removed: Non-viable soft tissue Blood loss: Minimal (<50cc) Depth of Debridement: subcutaneous tissue. Technique: Sharp excisional debridement to bleeding, viable wound base.  Wound Progress: This my initial evaluation I will continue monitor the progression of the wound Site healing conversation 7 Dressing: Dry, sterile, compression dressing. Disposition: Patient tolerated procedure well. Patient to return in 1 week for follow-up.  No follow-ups on file.

## 2020-10-21 NOTE — Telephone Encounter (Signed)
Schedule patient to come in for ABI see Dew or Arna Medici

## 2020-10-22 NOTE — Chronic Care Management (AMB) (Signed)
  Chronic Care Management   Outreach Note  10/22/2020 Name: MAGENTA KRAUTER MRN: [id number] DOB: November 12, 1945  Andrea [name] is a 75 y.o. year old female who is a primary care patient of Brita Romp, Dionne Bucy, MD. I reached out to [name] by phone today in response to a referral sent by Ms. Verdene Lennert Morocho's PCP Virginia Crews, MD     A second unsuccessful telephone outreach was attempted today. The patient was referred to the case management team for assistance with care management and care coordination.   Follow Up Plan: A HIPAA compliant phone message was left for the patient providing contact information and requesting a return call.  If patient returns call to provider office, please advise to call Embedded Care Management Care Guide Joelie Schou at [place], [place]  Direct Dial: [phone]

## 2020-10-24 ENCOUNTER — Telehealth: Payer: Self-pay | Admitting: *Deleted

## 2020-10-24 NOTE — Chronic Care Management (AMB) (Signed)
  Chronic Care Management   Note  10/24/2020 Name: TANEIL LAZARUS MRN: [id number] DOB: Sep 18, 1945  JULANE CROCK is a 75 y.o. year old female who is a primary care patient of Brita Romp, Dionne Bucy, MD. I reached out to [name] by phone today in response to a referral sent by Ms. Verdene Lennert Zaborowski's PCP Virginia Crews, MD     Ms. Vine was given information about Chronic Care Management services today including:  CCM service includes personalized support from designated clinical staff supervised by her physician, including individualized plan of care and coordination with other care providers 24/7 contact phone numbers for assistance for urgent and routine care needs. Service will only be billed when office clinical staff spend 20 minutes or more in a month to coordinate care. Only one practitioner may furnish and bill the service in a calendar month. The patient may stop CCM services at any time (effective at the end of the month) by phone call to the office staff. The patient will be responsible for cost sharing (co-pay) of up to 20% of the service fee (after annual deductible is met).  Patient agreed to services and verbal consent obtained.   Follow up plan: Telephone appointment with care management team member scheduled for: 11/17/2020  Julian Hy, [facility]  Direct Dial: [phone]

## 2020-10-24 NOTE — Chronic Care Management (AMB) (Signed)
  Chronic Care Management   Outreach Note  10/24/2020 Name: Andrea [name] MRN: [id number] DOB: 04/01/1945  Andrea [name] is a 75 y.o. year old female who is a primary care patient of Brita Romp, Dionne Bucy, MD. I reached out to Andrea [name] by phone today in response to a referral sent by Andrea [name]'s PCP Andrea Crews, MD     Third unsuccessful telephone outreach was attempted today.The care management team is pleased to engage with this patient at any time in the future should he/she be interested in assistance from the care management team.   Follow Up Plan: We have been unable to make contact with the patient for follow up.   Julian Hy, [facility]  Direct Dial: [phone]

## 2020-10-24 NOTE — Progress Notes (Signed)
Opened in error

## 2020-10-27 ENCOUNTER — Telehealth: Payer: Self-pay | Admitting: Podiatry

## 2020-10-27 DIAGNOSIS — E785 Hyperlipidemia, unspecified: Secondary | ICD-10-CM | POA: Diagnosis not present

## 2020-10-27 DIAGNOSIS — I5022 Chronic systolic (congestive) heart failure: Secondary | ICD-10-CM | POA: Diagnosis not present

## 2020-10-27 DIAGNOSIS — J9601 Acute respiratory failure with hypoxia: Secondary | ICD-10-CM | POA: Diagnosis not present

## 2020-10-27 DIAGNOSIS — Z8673 Personal history of transient ischemic attack (TIA), and cerebral infarction without residual deficits: Secondary | ICD-10-CM | POA: Diagnosis not present

## 2020-10-27 DIAGNOSIS — I739 Peripheral vascular disease, unspecified: Secondary | ICD-10-CM | POA: Diagnosis not present

## 2020-10-27 DIAGNOSIS — M6281 Muscle weakness (generalized): Secondary | ICD-10-CM | POA: Diagnosis not present

## 2020-10-27 DIAGNOSIS — Z96642 Presence of left artificial hip joint: Secondary | ICD-10-CM | POA: Diagnosis not present

## 2020-10-27 DIAGNOSIS — I999 Unspecified disorder of circulatory system: Secondary | ICD-10-CM

## 2020-10-27 DIAGNOSIS — J449 Chronic obstructive pulmonary disease, unspecified: Secondary | ICD-10-CM | POA: Diagnosis not present

## 2020-10-27 DIAGNOSIS — I10 Essential (primary) hypertension: Secondary | ICD-10-CM | POA: Diagnosis not present

## 2020-10-27 NOTE — Telephone Encounter (Signed)
Called patient's daughter(Andrea [name]) to ask for a [place] facility that may give her a sooner appointment before September. She said to call her younger sister(Andrea [name]) who could help more.  [facility] number above,no answer, left vmessage to return call.

## 2020-10-27 NOTE — Telephone Encounter (Signed)
Patients [name] called stating Dr Posey Pronto ordered an [place] for pt. They cant have an appointment until September the second week due to them being booked. Patients daughter wanted to know if you could order it stat or send her someone else. Daughter Beverlyn Roux number is [phone]

## 2020-10-28 ENCOUNTER — Telehealth: Payer: Self-pay | Admitting: Podiatry

## 2020-10-28 NOTE — Telephone Encounter (Signed)
Pt's daughter called requesting a different location for her mothers ultrasound. She states her pain level has changed and needs to get in sooner. Please advise.

## 2020-10-29 ENCOUNTER — Encounter: Payer: Self-pay | Admitting: Podiatry

## 2020-10-29 ENCOUNTER — Telehealth: Payer: Self-pay | Admitting: Podiatry

## 2020-10-29 DIAGNOSIS — Z471 Aftercare following joint replacement surgery: Secondary | ICD-10-CM | POA: Diagnosis not present

## 2020-10-29 DIAGNOSIS — E119 Type 2 diabetes mellitus without complications: Secondary | ICD-10-CM | POA: Diagnosis not present

## 2020-10-29 DIAGNOSIS — I739 Peripheral vascular disease, unspecified: Secondary | ICD-10-CM | POA: Diagnosis not present

## 2020-10-29 DIAGNOSIS — J449 Chronic obstructive pulmonary disease, unspecified: Secondary | ICD-10-CM | POA: Diagnosis not present

## 2020-10-29 DIAGNOSIS — I259 Chronic ischemic heart disease, unspecified: Secondary | ICD-10-CM | POA: Diagnosis not present

## 2020-10-29 DIAGNOSIS — L89313 Pressure ulcer of right buttock, stage 3: Secondary | ICD-10-CM | POA: Diagnosis not present

## 2020-10-29 DIAGNOSIS — J9601 Acute respiratory failure with hypoxia: Secondary | ICD-10-CM | POA: Diagnosis not present

## 2020-10-29 DIAGNOSIS — I5022 Chronic systolic (congestive) heart failure: Secondary | ICD-10-CM | POA: Diagnosis not present

## 2020-10-29 DIAGNOSIS — I110 Hypertensive heart disease with heart failure: Secondary | ICD-10-CM | POA: Diagnosis not present

## 2020-10-29 DIAGNOSIS — L89620 Pressure ulcer of left heel, unstageable: Secondary | ICD-10-CM | POA: Diagnosis not present

## 2020-10-29 NOTE — Telephone Encounter (Signed)
"  We've been trying to call there the last two days and no one has called [place] back."  Your message was received.  Ammie tried to call you as well as your mother.  "I didn't get a call from anyone.  My mother is scheduled for an ultrasound on 11/13/2020.  We need an appointment at another facility that we can get in sooner.  It's urgent.  She can't wait that long.  The place on her foot is not healing."  Does she need to come in to see Dr. Posey Pronto since it's urgent?  "No, there's nothing he can do.  She just needs a sooner appointment.  Can he refer her to [facility]?"  I will send Dr. Posey Pronto and his assistant a message but I can't guarantee that they will be able to see her any sooner.  "I understand that but if they can't, we'll try somewhere else.  Please mark it urgent."

## 2020-10-29 NOTE — Addendum Note (Signed)
Addended by: Boneta Lucks on: 10/29/2020 11:33 AM   Modules accepted: Orders

## 2020-10-29 NOTE — Telephone Encounter (Signed)
Dughter Joy called asking the progress of [employer]. Daughter would like a call back. [phone].

## 2020-10-30 DIAGNOSIS — I110 Hypertensive heart disease with heart failure: Secondary | ICD-10-CM | POA: Diagnosis not present

## 2020-10-30 DIAGNOSIS — I739 Peripheral vascular disease, unspecified: Secondary | ICD-10-CM | POA: Diagnosis not present

## 2020-10-30 DIAGNOSIS — Z471 Aftercare following joint replacement surgery: Secondary | ICD-10-CM | POA: Diagnosis not present

## 2020-10-30 DIAGNOSIS — J449 Chronic obstructive pulmonary disease, unspecified: Secondary | ICD-10-CM | POA: Diagnosis not present

## 2020-10-30 DIAGNOSIS — J9601 Acute respiratory failure with hypoxia: Secondary | ICD-10-CM | POA: Diagnosis not present

## 2020-10-30 DIAGNOSIS — L89620 Pressure ulcer of left heel, unstageable: Secondary | ICD-10-CM | POA: Diagnosis not present

## 2020-10-30 DIAGNOSIS — I259 Chronic ischemic heart disease, unspecified: Secondary | ICD-10-CM | POA: Diagnosis not present

## 2020-10-30 DIAGNOSIS — L89313 Pressure ulcer of right buttock, stage 3: Secondary | ICD-10-CM | POA: Diagnosis not present

## 2020-10-30 DIAGNOSIS — I5022 Chronic systolic (congestive) heart failure: Secondary | ICD-10-CM | POA: Diagnosis not present

## 2020-10-30 DIAGNOSIS — E119 Type 2 diabetes mellitus without complications: Secondary | ICD-10-CM | POA: Diagnosis not present

## 2020-10-30 NOTE — Telephone Encounter (Signed)
Called daughter Caryl Asp and let her know Dr Posey Pronto sent order to [facility].

## 2020-10-31 ENCOUNTER — Ambulatory Visit (INDEPENDENT_AMBULATORY_CARE_PROVIDER_SITE_OTHER): Payer: Medicare Other

## 2020-10-31 ENCOUNTER — Other Ambulatory Visit: Payer: Self-pay

## 2020-10-31 DIAGNOSIS — I999 Unspecified disorder of circulatory system: Secondary | ICD-10-CM | POA: Diagnosis not present

## 2020-11-04 ENCOUNTER — Ambulatory Visit: Payer: Self-pay

## 2020-11-04 ENCOUNTER — Telehealth: Payer: Self-pay | Admitting: Family Medicine

## 2020-11-04 ENCOUNTER — Telehealth: Payer: Self-pay

## 2020-11-04 DIAGNOSIS — J9601 Acute respiratory failure with hypoxia: Secondary | ICD-10-CM | POA: Diagnosis not present

## 2020-11-04 DIAGNOSIS — I739 Peripheral vascular disease, unspecified: Secondary | ICD-10-CM | POA: Diagnosis not present

## 2020-11-04 DIAGNOSIS — L89620 Pressure ulcer of left heel, unstageable: Secondary | ICD-10-CM | POA: Diagnosis not present

## 2020-11-04 DIAGNOSIS — I5022 Chronic systolic (congestive) heart failure: Secondary | ICD-10-CM | POA: Diagnosis not present

## 2020-11-04 DIAGNOSIS — L89313 Pressure ulcer of right buttock, stage 3: Secondary | ICD-10-CM | POA: Diagnosis not present

## 2020-11-04 DIAGNOSIS — I110 Hypertensive heart disease with heart failure: Secondary | ICD-10-CM | POA: Diagnosis not present

## 2020-11-04 DIAGNOSIS — Z471 Aftercare following joint replacement surgery: Secondary | ICD-10-CM | POA: Diagnosis not present

## 2020-11-04 DIAGNOSIS — E119 Type 2 diabetes mellitus without complications: Secondary | ICD-10-CM | POA: Diagnosis not present

## 2020-11-04 DIAGNOSIS — J449 Chronic obstructive pulmonary disease, unspecified: Secondary | ICD-10-CM | POA: Diagnosis not present

## 2020-11-04 DIAGNOSIS — I259 Chronic ischemic heart disease, unspecified: Secondary | ICD-10-CM | POA: Diagnosis not present

## 2020-11-04 NOTE — Telephone Encounter (Signed)
Mitzi Hansen, RN with [facility] called to report the patient's low blood sugar this morning at 8am of 51. She says the patient reported feeling shaky, anxious, nauseated, and agitated. She says the patient takes Lantus 65 units at night and Trulicity on Wednesday's and took it last week. I asked to speak to the patient about the low blood sugar. I asked if she treated the low blood sugar this morning. She says she only drank coffee with artificial sweetener and sugar free creamer. I asked her to check her blood sugar now, it is up to 74 at time of the call, she still reports feeling a little nausea, no other symptoms. She says most mornings she runs between 70's-80's. The nurse read her meter and gave the following: Yesterday-68 (patient reported feeling the same as this morning); Saturday at 2 pm 150; Friday morning 84; Thursday morning 104; Wednesday morning 139 (home from rehab on Tuesday). Supper last night and Lantus at 6 pm-potatoes, peach cobbler with ice cream, mayonnaise sandwich; no bedtime snack. Nurse says she will be back on Thursday, 2 x week for 2 weeks. Patient advised home care advice, advised to eat a bedtime snack, advised to eat smaller meals throughout the day and not just one big meal at supper, advised hypoglycemia symptoms and to call 911 if unable to treat the hypoglycemia, advised this will be sent to PCP and someone will call back with her recommendations. Patient verbalized understanding.   Reason for Disposition  [1] Morning (before breakfast) blood glucose < 80 mg/dL (4.4 mmol/L) AND [2] more than once in past week  Answer Assessment - Initial Assessment Questions 1. SYMPTOMS: "What symptoms are you concerned about?"     Shaky, agitated, nausea, anxious 2. ONSET:  "When did the symptoms start?"     Symptoms was when I woke up this morning 3. BLOOD GLUCOSE: "What is your blood glucose level?"      51 4. USUAL RANGE: "What is your blood glucose  level usually?" (e.g., usual fasting morning value, usual evening value)     70's-80's  5. TYPE 1 or 2:  "Do you know what type of diabetes you have?"  (e.g., Type 1, Type 2, Gestational; doesn't know)      Type 2 6. INSULIN: "Do you take insulin?" "What type of insulin(s) do you use? What is the mode of delivery? (syringe, pen; injection or pump) "When did you last give yourself an insulin dose?" (i.e., time or hours/minutes ago) "How much did you give?" (i.e., how many units)     Lantus 65 units QHS around 6pm last night; usually given around 5-6 7. DIABETES PILLS: "Do you take any pills for your diabetes?"     No 8. OTHER SYMPTOMS: "Do you have any symptoms?" (e.g., fever, frequent urination, difficulty breathing, vomiting)     Feel a little nausea now 9. LOW BLOOD GLUCOSE TREATMENT: "What have you done so far to treat the low blood glucose level?"     Nothing, just drank coffee 10. FOOD: "When did you last eat or drink?"       6 pm last night 11. ALONE: "Are you alone right now or is someone with you?"        Home health nurse is here right now 12. PREGNANCY: "Is there any chance you are pregnant?" "When was your last menstrual period?"       No  Protocols used: Diabetes - Low Blood Sugar-A-AH

## 2020-11-04 NOTE — Telephone Encounter (Signed)
   Per [name] request contacted [facility] to see if [facility] still needed as 2 orders for abi in Epic and ABI done at [facility].      Per Dr. Posey Pronto :   Yes that is fine we can cancel the order as she has already done the ABIs

## 2020-11-04 NOTE — Telephone Encounter (Signed)
Joy advised as below.

## 2020-11-04 NOTE — Telephone Encounter (Signed)
Andrea [name] with [facility] calling to report pt blood sugar 51.  Transferred to NT.

## 2020-11-04 NOTE — Telephone Encounter (Signed)
Attempted to discuss no ans no vm

## 2020-11-04 NOTE — Telephone Encounter (Signed)
Copied from [place] [id number]. Topic: Appointment Scheduling - Prior Auth Required for Appointment >> Nov 04, 2020  2:17 PM Robina Ade, Helene Kelp D wrote: No appointment has been scheduled. Patient is requesting hospital f/u appointment. There is not any appointment soon and patient would like to come in and see Dr. Jacinto Reap. Per scheduling protocol, this appointment requires a prior authorization prior to scheduling.  Route to department's PEC pool.

## 2020-11-05 NOTE — Telephone Encounter (Signed)
Spoke with pt's daughter and appt had already been scheduled from previous message.

## 2020-11-06 ENCOUNTER — Ambulatory Visit: Payer: Medicare Other | Admitting: Podiatry

## 2020-11-06 ENCOUNTER — Other Ambulatory Visit: Payer: Self-pay

## 2020-11-06 ENCOUNTER — Encounter: Payer: Self-pay | Admitting: Podiatry

## 2020-11-06 DIAGNOSIS — I739 Peripheral vascular disease, unspecified: Secondary | ICD-10-CM | POA: Diagnosis not present

## 2020-11-06 DIAGNOSIS — J9601 Acute respiratory failure with hypoxia: Secondary | ICD-10-CM | POA: Diagnosis not present

## 2020-11-06 DIAGNOSIS — L89313 Pressure ulcer of right buttock, stage 3: Secondary | ICD-10-CM | POA: Diagnosis not present

## 2020-11-06 DIAGNOSIS — J449 Chronic obstructive pulmonary disease, unspecified: Secondary | ICD-10-CM | POA: Diagnosis not present

## 2020-11-06 DIAGNOSIS — I5022 Chronic systolic (congestive) heart failure: Secondary | ICD-10-CM | POA: Diagnosis not present

## 2020-11-06 DIAGNOSIS — I259 Chronic ischemic heart disease, unspecified: Secondary | ICD-10-CM | POA: Diagnosis not present

## 2020-11-06 DIAGNOSIS — E113293 Type 2 diabetes mellitus with mild nonproliferative diabetic retinopathy without macular edema, bilateral: Secondary | ICD-10-CM

## 2020-11-06 DIAGNOSIS — L89620 Pressure ulcer of left heel, unstageable: Secondary | ICD-10-CM | POA: Diagnosis not present

## 2020-11-06 DIAGNOSIS — Z794 Long term (current) use of insulin: Secondary | ICD-10-CM | POA: Diagnosis not present

## 2020-11-06 DIAGNOSIS — Z471 Aftercare following joint replacement surgery: Secondary | ICD-10-CM | POA: Diagnosis not present

## 2020-11-06 DIAGNOSIS — E119 Type 2 diabetes mellitus without complications: Secondary | ICD-10-CM | POA: Diagnosis not present

## 2020-11-06 DIAGNOSIS — I110 Hypertensive heart disease with heart failure: Secondary | ICD-10-CM | POA: Diagnosis not present

## 2020-11-06 MED ORDER — SANTYL 250 UNIT/GM EX OINT: 1.0000 "application " | TOPICAL_OINTMENT | Freq: Every day | CUTANEOUS | 0 refills | Status: DC

## 2020-11-07 ENCOUNTER — Telehealth: Payer: Self-pay | Admitting: Family Medicine

## 2020-11-07 ENCOUNTER — Ambulatory Visit: Payer: Self-pay | Admitting: *Deleted

## 2020-11-07 NOTE — Telephone Encounter (Signed)
Andrea [name] from [facility]  Blood sugar readings from last night/this morning were 57/47. The patient did eat when spoke to patient this morning, 30 minutes later her blood sugar went up 160 [phone] Transferred to Triage.

## 2020-11-07 NOTE — Telephone Encounter (Signed)
Patient's daughter states that Andrea [name] blood sugars at bed time are less than 120. Please advise.

## 2020-11-07 NOTE — Telephone Encounter (Signed)
Called by Alleen Borne, RN from [facility] [phone]. RN is not with patient at this time. RN reporting patient is having issues with managing blood glucose. Triaged earlier this week and patient has attempted to eat a snack after dinner approx. 9 pm to increase her am fasting blood sugars. RN reports patient took lantus 60 units at night as prescribed last night . patient notified [place] RN this am blood sugar was 47 , symptomatic with c/o shaking, sleepy, anxious. Patient at frosted flakes with banana and called RN back 30 minutes later to report blood sugar now 160. RN reports patient has lost weight and eating a more balanced diet. Please advise if patient needs adjustment of insulin.

## 2020-11-11 DIAGNOSIS — I5022 Chronic systolic (congestive) heart failure: Secondary | ICD-10-CM | POA: Diagnosis not present

## 2020-11-11 DIAGNOSIS — E119 Type 2 diabetes mellitus without complications: Secondary | ICD-10-CM | POA: Diagnosis not present

## 2020-11-11 DIAGNOSIS — I259 Chronic ischemic heart disease, unspecified: Secondary | ICD-10-CM | POA: Diagnosis not present

## 2020-11-11 DIAGNOSIS — L89313 Pressure ulcer of right buttock, stage 3: Secondary | ICD-10-CM | POA: Diagnosis not present

## 2020-11-11 DIAGNOSIS — J449 Chronic obstructive pulmonary disease, unspecified: Secondary | ICD-10-CM | POA: Diagnosis not present

## 2020-11-11 DIAGNOSIS — L89620 Pressure ulcer of left heel, unstageable: Secondary | ICD-10-CM | POA: Diagnosis not present

## 2020-11-11 DIAGNOSIS — I110 Hypertensive heart disease with heart failure: Secondary | ICD-10-CM | POA: Diagnosis not present

## 2020-11-11 DIAGNOSIS — J9601 Acute respiratory failure with hypoxia: Secondary | ICD-10-CM | POA: Diagnosis not present

## 2020-11-11 DIAGNOSIS — Z471 Aftercare following joint replacement surgery: Secondary | ICD-10-CM | POA: Diagnosis not present

## 2020-11-11 DIAGNOSIS — I739 Peripheral vascular disease, unspecified: Secondary | ICD-10-CM | POA: Diagnosis not present

## 2020-11-11 NOTE — Telephone Encounter (Signed)
That does sound better. Glad she has f/u appt later this week to discuss with PCP

## 2020-11-11 NOTE — Telephone Encounter (Signed)
Patient is doing better on the 50 units of lantus. Patient reports blood sugar was in the low 200's last night and this morning fbs was in the 90's.

## 2020-11-12 NOTE — Progress Notes (Signed)
Subjective:  Patient ID: Andrea [name], female    DOB: December 01, 1945,  MRN: [id number]  Chief Complaint  Patient presents with   Wound Check    Left heel wound     75 y.o. female presents for wound care.  Patient presents with complaint of left posterior heel wound necrotic base.  Patient states been going on since June 14.  Patient had it from a cam boot pressure injury.  She states that she has a cam boot from her hip surgery.  She has been dealing with this wound has not gotten better has been stable.  She has completed course of antibiotic she will get it further evaluated.  She has not seen anyone else prior to seeing me.  She denies any other acute complaints.  She has a history of peripheral vascular disease.  She has been applying Santyl wet-to-dry.  She has nursing coming at home.  Review of Systems: Negative except as noted in the HPI. Denies N/V/F/Ch.  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   Depression    Diabetes mellitus type 2, uncomplicated ([place])    Diabetic retinopathy ([place])    Dupuytren contracture 2011   s/p surgery to LEFT hand   Frequent urinary tract infections    GERD (gastroesophageal reflux disease)    Grade I diastolic dysfunction    HTN (hypertension)    Hyperlipidemia, unspecified    Irritable bowel syndrome    Meningitis 11/2019   MI (myocardial infarction) ([place]) 1994   no stent   Neuromuscular disorder (HCC)    nerve pain in back and legs   Osteoporosis    Pneumonia 2021   RBBB    Sepsis ([place]) 11/2019   Skin cancer    TIA (transient ischemic attack) 2014   Vitamin D deficiency, unspecified    Wheezing     Current Outpatient Medications:    collagenase (SANTYL) ointment, Apply 1 application  topically daily., Disp: 15 g, Rfl: 0   Alcohol Swabs (ALCOHOL PREP) PADS, Use twice daily prior to SQ injection of insulin to clean skin, Disp: 100 each, Rfl: 6   atorvastatin (LIPITOR) 80 MG tablet, TAKE 1 TABLET BY MOUTH EVERY DAY, Disp: 90 tablet, Rfl: 0   Barberry-Oreg Grape-Goldenseal (BERBERINE COMPLEX PO), Take 1 tablet by mouth 2 (two) times daily with a meal., Disp: , Rfl:    Calcium Carbonate-Vitamin D 600-400 MG-UNIT tablet, Take 1 tablet by mouth 2 (two) times daily., Disp: , Rfl:    Cyanocobalamin (B-12) 1000 MCG CAPS, Take 1,000 mcg by mouth daily. , Disp: , Rfl:    docusate sodium (COLACE) 100 MG capsule, Take 1 capsule (100 mg total) by mouth 2 (two) times daily., Disp: 10 capsule, Rfl: 0   enoxaparin (LOVENOX) 40 MG/0.4ML injection, Inject 0.4 mLs (40 mg total) into the skin daily for 14 days., Disp: 5.6 mL, Rfl: 0   furosemide (LASIX) 20 MG tablet, Take 1 tablet (20 mg total) by mouth daily., Disp: 90 tablet, Rfl: 1   Hypertonic Nasal Wash (SINUS RINSE NA), Place 1 application into the nose at bedtime., Disp: , Rfl:    insulin glargine (LANTUS) 100 UNIT/ML injection, Inject 65 Units into  the skin daily., Disp: , Rfl:    Insulin Pen Needle (TRUEPLUS PEN NEEDLES) 31G X 6 MM MISC, Use with lantus two times daily., Disp: 100 each, Rfl: 3   INSULIN SYRINGE 1CC/29G 29G X 1/2" 1 ML MISC, For lantus injections twice daily, Disp: 200 each, Rfl: 1   losartan (COZAAR) 25 MG tablet, TAKE 1 TABLET BY MOUTH EVERY DAY, Disp: 90 tablet, Rfl: 0   metoprolol succinate (TOPROL-XL) 25 MG 24 hr tablet, TAKE 1 TABLET BY MOUTH EVERY DAY, Disp: 90 tablet, Rfl: 3   Multiple Vitamins-Minerals (PRESERVISION AREDS 2 PO), Take 1 tablet by mouth in the morning and at bedtime., Disp: , Rfl:    Omega-3 1000 MG CAPS, Take 1,000 mg by mouth daily., Disp: , Rfl:    omeprazole (PRILOSEC) 40 MG capsule, TAKE 1 CAPSULE BY MOUTH EVERY DAY, Disp: 90 capsule, Rfl: 0   OneTouch Delica Lancets 99991111 MISC, To check blood  sugar daily  DX: E11.9, Disp: 100 each, Rfl: 5   ONETOUCH VERIO test strip, TO CHECK BLOOD SUGAR ONCE DAILY., Disp: 100 strip, Rfl: 11   oxybutynin (DITROPAN-XL) 10 MG 24 hr tablet, TAKE 1 TABLET BY MOUTH EVERYDAY AT BEDTIME, Disp: 90 tablet, Rfl: 0   oxyCODONE (OXY IR/ROXICODONE) 5 MG immediate release tablet, Take 1-2 tablets (5-10 mg total) by mouth every 4 (four) hours as needed for moderate pain (pain score 4-6)., Disp: 30 tablet, Rfl: 0   potassium chloride (KLOR-CON) 10 MEQ tablet, Take 1 tablet (10 mEq total) by mouth daily., Disp: 90 tablet, Rfl: 1   traMADol (ULTRAM) 50 MG tablet, Take 1 tablet (50 mg total) by mouth every 6 (six) hours as needed., Disp: 30 tablet, Rfl: 0   TRULICITY 1.5 0000000 SOPN, INJECT 1.'5MG'$  INTO THE SKIN ONCE A WEEK AS DIRECTED, Disp: 2 mL, Rfl: 2   Vitamin D, Ergocalciferol, (DRISDOL) 1.25 MG (50000 UNIT) CAPS capsule, TAKE ONE CAPSULE BY MOUTH ONCE WEEKLY ON THE SAME DAY EACH WEEK (ON FRIDAYS), Disp: 12 capsule, Rfl: 1  Social History   Tobacco Use  Smoking Status Former   Packs/day: 1.00   Years: 30.00   Pack years: 30.00   Types: Cigarettes   Quit date: 07/17/2012   Years since quitting: 8.3  Smokeless Tobacco Never  Tobacco Comments        Allergies  Allergen Reactions   Metformin And Related Diarrhea   Sulfa Antibiotics Rash   Objective:  There were no vitals filed for this visit. There is no height or weight on file to calculate BMI. Constitutional Well developed. Well nourished.  Vascular Dorsalis pedis pulses non palpable bilaterally. Posterior tibial pulses non palpable bilaterally. Capillary refill normal to all digits.  No cyanosis or clubbing noted. Pedal hair growth normal.  Neurologic Normal speech. Oriented to person, place, and time. Protective sensation absent  Dermatologic Wound Location: Left posterior leg wound with necrotic eschar base unstageable.  Mild erythema circumferential around the wound.  No streaking  cellulitis noted no probing down to deep tissue.  No exposure of Achilles tendon. Wound Base: Mixed Granular/Fibrotic Peri-wound: Macerated Exudate: Scant/small amount Serosanguinous exudate Wound Measurements: -See below  Orthopedic: No pain to palpation either foot.   Radiographs: None Assessment:   1. 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 edema, with long-term current use of insulin ([place])   2. Pressure ulcer of left heel, unstageable ([place])     Plan:  Patient was evaluated and treated and all  questions answered.  Ulcer left posterior heel wound with necrotic base unstageable -Debridement as below. -Dressed with Santyl wet-to-dry, DSD. -Continue off-loading with surgical shoe. -ABIs were within normal limits.  Good flow noted to the left lower extremity. -I will order an MRI of the heel to rule out osteomyelitis and tendon involvement given given the location and nature of the wound.  At this time patient is a high risk of limb salvage ability if there is bone infection present. -Home health order was placed for Santyl wet-to-dry dressing  Procedure: Excisional Debridement of Wound Tool: Sharp chisel blade/tissue nipper Rationale: Removal of non-viable soft tissue from the wound to promote healing.  Anesthesia: none Pre-Debridement Wound Measurements: 2.5 cm x 1.5 cm x unstageable cm  Post-Debridement Wound Measurements: 2.7 cm x 1.6 cm x unstageable cm  Type of Debridement: Sharp Excisional Tissue Removed: Non-viable soft tissue Blood loss: Minimal (<50cc) Depth of Debridement: subcutaneous tissue. Technique: Sharp excisional debridement to bleeding, viable wound base.  Wound Progress: This my initial evaluation I will continue monitor the progression of the wound Site healing conversation 7 Dressing: Dry, sterile, compression dressing. Disposition: Patient tolerated procedure well. Patient to return in 1 week  for follow-up.  No follow-ups on file.

## 2020-11-13 ENCOUNTER — Other Ambulatory Visit: Payer: Self-pay

## 2020-11-13 ENCOUNTER — Ambulatory Visit (INDEPENDENT_AMBULATORY_CARE_PROVIDER_SITE_OTHER): Payer: Medicare Other | Admitting: Family Medicine

## 2020-11-13 ENCOUNTER — Ambulatory Visit
Admission: RE | Admit: 2020-11-13 | Discharge: 2020-11-13 | Disposition: A | Discharge status: Home / Self Care | Payer: Medicare Other | Source: Ambulatory Visit | Attending: Family Medicine | Admitting: Family Medicine

## 2020-11-13 ENCOUNTER — Ambulatory Visit
Admission: RE | Admit: 2020-11-13 | Discharge: 2020-11-13 | Disposition: A | Discharge status: Home / Self Care | Payer: Medicare Other | Attending: Family Medicine | Admitting: Family Medicine

## 2020-11-13 ENCOUNTER — Encounter: Payer: Self-pay | Admitting: Family Medicine

## 2020-11-13 VITALS — BP 109/55 | HR 99 | Temp 97.6°F | Resp 16 | Wt 138.7 lb

## 2020-11-13 DIAGNOSIS — E119 Type 2 diabetes mellitus without complications: Secondary | ICD-10-CM | POA: Diagnosis not present

## 2020-11-13 DIAGNOSIS — S91302A Unspecified open wound, left foot, initial encounter: Secondary | ICD-10-CM

## 2020-11-13 DIAGNOSIS — I259 Chronic ischemic heart disease, unspecified: Secondary | ICD-10-CM | POA: Diagnosis not present

## 2020-11-13 DIAGNOSIS — E113293 Type 2 diabetes mellitus with mild nonproliferative diabetic retinopathy without macular edema, bilateral: Secondary | ICD-10-CM

## 2020-11-13 DIAGNOSIS — Z471 Aftercare following joint replacement surgery: Secondary | ICD-10-CM | POA: Diagnosis not present

## 2020-11-13 DIAGNOSIS — I739 Peripheral vascular disease, unspecified: Secondary | ICD-10-CM | POA: Diagnosis not present

## 2020-11-13 DIAGNOSIS — L03116 Cellulitis of left lower limb: Secondary | ICD-10-CM | POA: Insufficient documentation

## 2020-11-13 DIAGNOSIS — L97429 Non-pressure chronic ulcer of left heel and midfoot with unspecified severity: Secondary | ICD-10-CM | POA: Diagnosis not present

## 2020-11-13 DIAGNOSIS — E162 Hypoglycemia, unspecified: Secondary | ICD-10-CM | POA: Diagnosis not present

## 2020-11-13 DIAGNOSIS — I110 Hypertensive heart disease with heart failure: Secondary | ICD-10-CM | POA: Diagnosis not present

## 2020-11-13 DIAGNOSIS — Z96642 Presence of left artificial hip joint: Secondary | ICD-10-CM | POA: Diagnosis not present

## 2020-11-13 DIAGNOSIS — Z87828 Personal history of other (healed) physical injury and trauma: Secondary | ICD-10-CM | POA: Diagnosis not present

## 2020-11-13 DIAGNOSIS — J449 Chronic obstructive pulmonary disease, unspecified: Secondary | ICD-10-CM | POA: Diagnosis not present

## 2020-11-13 DIAGNOSIS — M86172 Other acute osteomyelitis, left ankle and foot: Secondary | ICD-10-CM | POA: Diagnosis not present

## 2020-11-13 DIAGNOSIS — L89620 Pressure ulcer of left heel, unstageable: Secondary | ICD-10-CM | POA: Diagnosis not present

## 2020-11-13 DIAGNOSIS — I5022 Chronic systolic (congestive) heart failure: Secondary | ICD-10-CM | POA: Diagnosis not present

## 2020-11-13 DIAGNOSIS — L89313 Pressure ulcer of right buttock, stage 3: Secondary | ICD-10-CM | POA: Diagnosis not present

## 2020-11-13 DIAGNOSIS — J9601 Acute respiratory failure with hypoxia: Secondary | ICD-10-CM | POA: Diagnosis not present

## 2020-11-13 DIAGNOSIS — Z794 Long term (current) use of insulin: Secondary | ICD-10-CM

## 2020-11-13 LAB — GLUCOSE, POCT (MANUAL RESULT ENTRY): POC Glucose: 179 mg/dl — AB (ref 70–99)

## 2020-11-13 MED ORDER — CEPHALEXIN 500 MG PO CAPS: 500.0000 mg | ORAL_CAPSULE | Freq: Four times a day (QID) | ORAL | 0 refills | Status: DC

## 2020-11-13 MED ORDER — HYDROCODONE-ACETAMINOPHEN 5-325 MG PO TABS: 1.0000 | ORAL_TABLET | Freq: Three times a day (TID) | ORAL | 0 refills | Status: DC | PRN

## 2020-11-13 MED ORDER — LANTUS SOLOSTAR 100 UNIT/ML ~~LOC~~ SOPN: 40.0000 [IU] | PEN_INJECTOR | Freq: Every day | SUBCUTANEOUS | 3 refills | Status: DC

## 2020-11-13 NOTE — Assessment & Plan Note (Signed)
3 wounds since d/c from rehab facility -sacral pressure injury -left forearm skin tear -left heel ulceration/wound  Muscle wasting in L leg Unable to bear weight d/t heel pain Using w/c f/t

## 2020-11-13 NOTE — Progress Notes (Signed)
Established patient visit   Patient: Andrea [name]   DOB: [birth year]   75 y.o. Female  MRN: [id number] Visit Date: 11/13/2020  Today's healthcare provider: [name], FNP   Chief Complaint  Patient presents with   Hypoglycemia   Subjective    HPI HPI   Patient comes in office today accompanied by her daughter who states for the past week patient has been experincing low blood sugar readings in morning have been ranging from 47-50, patient states that there was only one evening that she knows of that blood sugar was 50. Patient reports that she experiences shakiness when blood sugar drops. Patient has since stopped her insulin, patients daughter states that patient was in rehab due to hip surgery and since being home she reports that patients diet at home has been poor and eats 1 meal a day when she was previously eating 3.  Last edited by Minette Headland, CMA on 11/13/2020  3:06 PM.         Medications: Outpatient Medications Prior to Visit  Medication Sig   Alcohol Swabs (ALCOHOL PREP) PADS Use twice daily prior to SQ injection of insulin to clean skin   atorvastatin (LIPITOR) 80 MG tablet TAKE 1 TABLET BY MOUTH EVERY DAY   Barberry-Oreg Grape-Goldenseal (BERBERINE COMPLEX PO) Take 1 tablet by mouth 2 (two) times daily with a meal.   Calcium Carbonate-Vitamin D 600-400 MG-UNIT tablet Take 1 tablet by mouth 2 (two) times daily.   collagenase (SANTYL) ointment Apply 1 application topically daily.   Cyanocobalamin (B-12) 1000 MCG CAPS Take 1,000 mcg by mouth daily.    furosemide (LASIX) 20 MG tablet Take 1 tablet (20 mg total) by mouth daily.   Hypertonic Nasal Wash (SINUS RINSE NA) Place 1 application into the nose at bedtime.   Insulin Pen Needle (TRUEPLUS PEN NEEDLES) 31G X 6 MM MISC Use with lantus two times daily.   INSULIN SYRINGE 1CC/29G 29G X 1/2" 1 ML MISC For lantus injections twice daily   losartan (COZAAR) 25 MG tablet TAKE 1 TABLET BY MOUTH EVERY DAY    metoprolol succinate (TOPROL-XL) 25 MG 24 hr tablet TAKE 1 TABLET BY MOUTH EVERY DAY   Multiple Vitamins-Minerals (PRESERVISION AREDS 2 PO) Take 1 tablet by mouth in the morning and at bedtime.   Omega-3 1000 MG CAPS Take 1,000 mg by mouth daily.   omeprazole (PRILOSEC) 40 MG capsule TAKE 1 CAPSULE BY MOUTH EVERY DAY   OneTouch Delica Lancets 99991111 MISC To check blood sugar daily  DX: E11.9   ONETOUCH VERIO test strip TO CHECK BLOOD SUGAR ONCE DAILY.   oxybutynin (DITROPAN-XL) 10 MG 24 hr tablet TAKE 1 TABLET BY MOUTH EVERYDAY AT BEDTIME   potassium chloride (KLOR-CON) 10 MEQ tablet Take 1 tablet (10 mEq total) by mouth daily.   TRULICITY 1.5 0000000 SOPN INJECT 1.'5MG'$  INTO THE SKIN ONCE A WEEK AS DIRECTED   Vitamin D, Ergocalciferol, (DRISDOL) 1.25 MG (50000 UNIT) CAPS capsule TAKE ONE CAPSULE BY MOUTH ONCE WEEKLY ON THE SAME DAY EACH WEEK (ON FRIDAYS)   [DISCONTINUED] HYDROcodone-acetaminophen (NORCO/VICODIN) 5-325 MG tablet Take 1 tablet by mouth every 6 (six) hours as needed for moderate pain.   enoxaparin (LOVENOX) 40 MG/0.4ML injection Inject 0.4 mLs (40 mg total) into the skin daily for 14 days.   [DISCONTINUED] docusate sodium (COLACE) 100 MG capsule Take 1 capsule (100 mg total) by mouth 2 (two) times daily.   [DISCONTINUED] insulin glargine (LANTUS) 100 UNIT/ML  injection Inject 65 Units into the skin daily.   [DISCONTINUED] LANTUS SOLOSTAR 100 UNIT/ML Solostar Pen SMARTSIG:65 Unit(s) SUB-Q Twice Daily (Patient not taking: Reported on 11/13/2020)   [DISCONTINUED] oxyCODONE (OXY IR/ROXICODONE) 5 MG immediate release tablet Take 1-2 tablets (5-10 mg total) by mouth every 4 (four) hours as needed for moderate pain (pain score 4-6).   [DISCONTINUED] traMADol (ULTRAM) 50 MG tablet Take 1 tablet (50 mg total) by mouth every 6 (six) hours as needed.   No facility-administered medications prior to visit.    Review of Systems    Objective    Pulse 99   Temp 97.6 F (36.4 C) (Oral)   Resp 16    Wt 138 lb 11.2 oz (62.9 kg)   SpO2 100%   BMI 30.01 kg/m    Physical Exam Vitals and nursing note reviewed.  Constitutional:      General: She is not in acute distress.    Appearance: Normal appearance. She is obese. She is not ill-appearing, toxic-appearing or diaphoretic.  HENT:     Head: Normocephalic and atraumatic.  Cardiovascular:     Rate and Rhythm: Normal rate and regular rhythm.     Pulses: Normal pulses.          Dorsalis pedis pulses are 2+ on the left side.       Posterior tibial pulses are 2+ on the left side.     Heart sounds: Normal heart sounds. No murmur heard.   No friction rub. No gallop.  Pulmonary:     Effort: Pulmonary effort is normal. No respiratory distress.     Breath sounds: Normal breath sounds. No stridor. No wheezing, rhonchi or rales.  Chest:     Chest wall: No tenderness.  Abdominal:     General: Bowel sounds are normal.     Palpations: Abdomen is soft.  Musculoskeletal:        General: No swelling, tenderness, deformity or signs of injury. Normal range of motion.     Right lower leg: No edema.     Left lower leg: 1+ Pitting Edema present.  Skin:    General: Skin is warm and dry.     Capillary Refill: Capillary refill takes less than 2 seconds.     Coloration: Skin is not jaundiced or pale.     Findings: Wound present. No bruising, erythema or rash.          Comments: Non healing wound on L heel from removal of ortho boot during facility stay; green/yellow slough at surface. Appears to be non pressure related.  L lower extremity tight, warm, pinkish appearance.   Recommend cleaning with pH balanced solution, ex Vashe, and place absorbant silver impregnated dressing. Recommend change BID and PRN.  Neurological:     General: No focal deficit present.     Mental Status: She is alert and oriented to person, place, and time. Mental status is at baseline.     Cranial Nerves: No cranial nerve deficit.     Sensory: No sensory deficit.      Motor: No weakness.     Coordination: Coordination normal.  Psychiatric:        Mood and Affect: Mood normal.        Behavior: Behavior normal.        Thought Content: Thought content normal.        Judgment: Judgment normal.     Results for orders placed or performed in visit on 11/13/20  POCT glucose (manual entry)  Result Value Ref Range   POC Glucose 179 (A) 70 - 99 mg/dl    Assessment & Plan     Problem List Items Addressed This Visit       Endocrine   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 edema, with long-term current use of insulin (HCC) - Primary    Having lows reiterated need for fast sugar during periods of lows Pt and daughter voiced understanding Refill of new dose of long acting insulin      Relevant Medications   LANTUS SOLOSTAR 100 UNIT/ML Solostar Pen   Hypoglycemia    BGs 40-50s x1 Has been low in 90s in ams Decrease lantus- 40 qhs      Relevant Medications   LANTUS SOLOSTAR 100 UNIT/ML Solostar Pen   Other Relevant Orders   POCT glucose (manual entry) (Completed)     Other   Status post total hip replacement, left    3 wounds since d/c from rehab facility -sacral pressure injury -left forearm skin tear -left heel ulceration/wound  Muscle wasting in L leg Unable to bear weight d/t heel pain Using w/c f/t       Cellulitis of left lower extremity    Of note wound to left heel Pink/reddened skin up to below L knee Skin edematous, pitting +1 Warm to touch, shiny, appears taut      Relevant Medications   cephALEXin (KEFLEX) 500 MG capsule   HYDROcodone-acetaminophen (NORCO/VICODIN) 5-325 MG tablet   Other Relevant Orders   Ambulatory referral to Wound Clinic   DG Foot Complete Left   Non healing left heel wound    4.5 cm x 5 cm x 0.3 cm wound to posterior L heel Green/yellow slough Consistent drainage/discharge Mild odor Skin surrounding boggy, warm, pink/reddened,  swollen      Relevant Medications   cephALEXin (KEFLEX) 500 MG capsule   HYDROcodone-acetaminophen (NORCO/VICODIN) 5-325 MG tablet   Other Relevant Orders   Ambulatory referral to Wound Clinic   DG Foot Complete Left     Return in about 3 months (around 02/12/2021) for chonic disease management.     Vonna Kotyk, FNP, have reviewed all documentation for this visit. The documentation on 11/13/20 for the exam, diagnosis, procedures, and orders are all accurate and complete.    [name], [place] [phone] (phone) [phone] (fax)  [place]

## 2020-11-13 NOTE — Assessment & Plan Note (Addendum)
Of note wound to left heel Pink/reddened skin up to below L knee Skin edematous, pitting +1 Warm to touch, shiny, appears taut

## 2020-11-13 NOTE — Assessment & Plan Note (Signed)
BGs 40-50s x1 Has been low in 90s in ams Decrease lantus- 40 qhs

## 2020-11-13 NOTE — Assessment & Plan Note (Signed)
Having lows reiterated need for fast sugar during periods of lows Pt and daughter voiced understanding Refill of new dose of long acting insulin

## 2020-11-13 NOTE — Assessment & Plan Note (Signed)
4.5 cm x 5 cm x 0.3 cm wound to posterior L heel Green/yellow slough Consistent drainage/discharge Mild odor Skin surrounding boggy, warm, pink/reddened, swollen

## 2020-11-14 ENCOUNTER — Ambulatory Visit: Payer: Self-pay | Admitting: *Deleted

## 2020-11-14 ENCOUNTER — Other Ambulatory Visit: Payer: Self-pay | Admitting: Family Medicine

## 2020-11-14 NOTE — Telephone Encounter (Signed)
Please advise. KW 

## 2020-11-14 NOTE — Telephone Encounter (Signed)
Patients daughter has been advised. KW ?

## 2020-11-14 NOTE — Telephone Encounter (Signed)
I returned pt's call.   She had called in wanting to know if Doxycycline and Keflex were compatible.   See triage notes for details.  She will call [facility] back if they are not compatible after checking with her pharmacist.  I sent my notes to [facility] for [name], FNP

## 2020-11-14 NOTE — Telephone Encounter (Signed)
Patient's daughter- [name] is calling- they have one more concern- patient has been on longer term Doxycycline for her foot wound- over 30 days(10 day+30 day-has 14 days left) and then now keflex for additional problem- should they be concerned about antibiotic resistance at any point? Advised would forward concern for PCP review- patient is aware of concern and con call her back with response

## 2020-11-14 NOTE — Telephone Encounter (Signed)
Reason for Disposition  [1] Caller has medicine question about med NOT prescribed by PCP AND [2] triager unable to answer question (e.g., compatibility with other med, storage)    See triage notes for details of this call.   Pt is calling her pharmacist for compatibility between Doxycycline and Keflex.  Answer Assessment - Initial Assessment Questions 1. NAME of MEDICATION: "What medicine are you calling about?"     Doxycycline and Keflex 2. QUESTION: "What is your question?" (e.g., double dose of medicine, side effect)     Can I take the Doxycycline and Keflex together?   Dr. Boneta Lucks ordered the Doxy for my heel infection and [name], FNP ordered the Keflex when I saw her yesterday.    I did not tell her I was on the Doxycycline.   (It's not listed on her medication record.   She checked the bottle and it was prescribed on 10/21/2020 and she has 27 pills left). She has not picked up the Keflex yet.    I instructed her to call the pharmacy and ask if the Doxy and Keflex were compatible prior her getting it filled.   If the pharmacist said they were not compatible to call us back.   She verbalized back to me the directions to call us back if they were incompatible and we would let [name], FNP know. 3. PRESCRIBING HCP: "Who prescribed it?" Reason: if prescribed by specialist, call should be referred to that group.     The Doxycycline was prescribed by Dr. Boneta Lucks, DPM for her heel infection. The Keflex was prescribed by [name], FNP for wound infection yesterday during office visit. 4. SYMPTOMS: "Do you have any symptoms?"     No    Infected heel wound 5. SEVERITY: If symptoms are present, ask "Are they mild, moderate or severe?"     N/A 6. PREGNANCY:  "Is there any chance that you are pregnant?" "When was your last menstrual period?"     N/A due to age  Protocols used: Medication Question Call-A-AH

## 2020-11-17 ENCOUNTER — Emergency Department: Payer: Medicare Other

## 2020-11-17 ENCOUNTER — Other Ambulatory Visit: Payer: Self-pay

## 2020-11-17 ENCOUNTER — Other Ambulatory Visit: Payer: Self-pay | Admitting: Family Medicine

## 2020-11-17 ENCOUNTER — Ambulatory Visit (INDEPENDENT_AMBULATORY_CARE_PROVIDER_SITE_OTHER): Payer: Medicare Other

## 2020-11-17 ENCOUNTER — Inpatient Hospital Stay
Admission: EM | Admit: 2020-11-17 | Discharge: 2020-12-01 | DRG: 623 | Disposition: A | Discharge status: Skilled Nursing Facility | Payer: Medicare Other | Attending: Internal Medicine | Admitting: Internal Medicine

## 2020-11-17 ENCOUNTER — Telehealth: Payer: Self-pay

## 2020-11-17 DIAGNOSIS — E1169 Type 2 diabetes mellitus with other specified complication: Principal | ICD-10-CM | POA: Diagnosis present

## 2020-11-17 DIAGNOSIS — B965 Pseudomonas (aeruginosa) (mallei) (pseudomallei) as the cause of diseases classified elsewhere: Secondary | ICD-10-CM | POA: Diagnosis present

## 2020-11-17 DIAGNOSIS — M86472 Chronic osteomyelitis with draining sinus, left ankle and foot: Secondary | ICD-10-CM | POA: Diagnosis not present

## 2020-11-17 DIAGNOSIS — I2510 Atherosclerotic heart disease of native coronary artery without angina pectoris: Secondary | ICD-10-CM | POA: Diagnosis not present

## 2020-11-17 DIAGNOSIS — E7800 Pure hypercholesterolemia, unspecified: Secondary | ICD-10-CM | POA: Diagnosis not present

## 2020-11-17 DIAGNOSIS — Z8673 Personal history of transient ischemic attack (TIA), and cerebral infarction without residual deficits: Secondary | ICD-10-CM

## 2020-11-17 DIAGNOSIS — M869 Osteomyelitis, unspecified: Secondary | ICD-10-CM | POA: Diagnosis present

## 2020-11-17 DIAGNOSIS — Z23 Encounter for immunization: Secondary | ICD-10-CM

## 2020-11-17 DIAGNOSIS — L97424 Non-pressure chronic ulcer of left heel and midfoot with necrosis of bone: Secondary | ICD-10-CM | POA: Diagnosis not present

## 2020-11-17 DIAGNOSIS — E782 Mixed hyperlipidemia: Secondary | ICD-10-CM | POA: Diagnosis not present

## 2020-11-17 DIAGNOSIS — I110 Hypertensive heart disease with heart failure: Secondary | ICD-10-CM | POA: Diagnosis present

## 2020-11-17 DIAGNOSIS — I5022 Chronic systolic (congestive) heart failure: Secondary | ICD-10-CM | POA: Diagnosis present

## 2020-11-17 DIAGNOSIS — I739 Peripheral vascular disease, unspecified: Secondary | ICD-10-CM | POA: Diagnosis not present

## 2020-11-17 DIAGNOSIS — Z794 Long term (current) use of insulin: Secondary | ICD-10-CM

## 2020-11-17 DIAGNOSIS — E785 Hyperlipidemia, unspecified: Secondary | ICD-10-CM | POA: Diagnosis not present

## 2020-11-17 DIAGNOSIS — Z87891 Personal history of nicotine dependence: Secondary | ICD-10-CM

## 2020-11-17 DIAGNOSIS — F419 Anxiety disorder, unspecified: Secondary | ICD-10-CM | POA: Diagnosis present

## 2020-11-17 DIAGNOSIS — Z833 Family history of diabetes mellitus: Secondary | ICD-10-CM

## 2020-11-17 DIAGNOSIS — F32A Depression, unspecified: Secondary | ICD-10-CM | POA: Diagnosis not present

## 2020-11-17 DIAGNOSIS — J42 Unspecified chronic bronchitis: Secondary | ICD-10-CM | POA: Diagnosis not present

## 2020-11-17 DIAGNOSIS — M810 Age-related osteoporosis without current pathological fracture: Secondary | ICD-10-CM | POA: Diagnosis present

## 2020-11-17 DIAGNOSIS — Z8661 Personal history of infections of the central nervous system: Secondary | ICD-10-CM

## 2020-11-17 DIAGNOSIS — D638 Anemia in other chronic diseases classified elsewhere: Secondary | ICD-10-CM | POA: Diagnosis present

## 2020-11-17 DIAGNOSIS — N3281 Overactive bladder: Secondary | ICD-10-CM | POA: Diagnosis not present

## 2020-11-17 DIAGNOSIS — I252 Old myocardial infarction: Secondary | ICD-10-CM | POA: Diagnosis not present

## 2020-11-17 DIAGNOSIS — L03116 Cellulitis of left lower limb: Secondary | ICD-10-CM | POA: Diagnosis present

## 2020-11-17 DIAGNOSIS — L97423 Non-pressure chronic ulcer of left heel and midfoot with necrosis of muscle: Secondary | ICD-10-CM | POA: Diagnosis not present

## 2020-11-17 DIAGNOSIS — Z882 Allergy status to sulfonamides status: Secondary | ICD-10-CM

## 2020-11-17 DIAGNOSIS — S91302A Unspecified open wound, left foot, initial encounter: Secondary | ICD-10-CM

## 2020-11-17 DIAGNOSIS — E113293 Type 2 diabetes mellitus with mild nonproliferative diabetic retinopathy without macular edema, bilateral: Secondary | ICD-10-CM

## 2020-11-17 DIAGNOSIS — Z09 Encounter for follow-up examination after completed treatment for conditions other than malignant neoplasm: Secondary | ICD-10-CM

## 2020-11-17 DIAGNOSIS — E11621 Type 2 diabetes mellitus with foot ulcer: Secondary | ICD-10-CM | POA: Diagnosis not present

## 2020-11-17 DIAGNOSIS — Z95828 Presence of other vascular implants and grafts: Secondary | ICD-10-CM | POA: Diagnosis not present

## 2020-11-17 DIAGNOSIS — M86272 Subacute osteomyelitis, left ankle and foot: Secondary | ICD-10-CM | POA: Diagnosis not present

## 2020-11-17 DIAGNOSIS — Z20822 Contact with and (suspected) exposure to covid-19: Secondary | ICD-10-CM | POA: Diagnosis present

## 2020-11-17 DIAGNOSIS — I25118 Atherosclerotic heart disease of native coronary artery with other forms of angina pectoris: Secondary | ICD-10-CM | POA: Diagnosis present

## 2020-11-17 DIAGNOSIS — E559 Vitamin D deficiency, unspecified: Secondary | ICD-10-CM | POA: Diagnosis present

## 2020-11-17 DIAGNOSIS — Z888 Allergy status to other drugs, medicaments and biological substances status: Secondary | ICD-10-CM

## 2020-11-17 DIAGNOSIS — J449 Chronic obstructive pulmonary disease, unspecified: Secondary | ICD-10-CM | POA: Diagnosis not present

## 2020-11-17 DIAGNOSIS — E1151 Type 2 diabetes mellitus with diabetic peripheral angiopathy without gangrene: Secondary | ICD-10-CM | POA: Diagnosis present

## 2020-11-17 DIAGNOSIS — E11319 Type 2 diabetes mellitus with unspecified diabetic retinopathy without macular edema: Secondary | ICD-10-CM | POA: Diagnosis not present

## 2020-11-17 DIAGNOSIS — J410 Simple chronic bronchitis: Secondary | ICD-10-CM

## 2020-11-17 DIAGNOSIS — M868X7 Other osteomyelitis, ankle and foot: Secondary | ICD-10-CM | POA: Diagnosis not present

## 2020-11-17 DIAGNOSIS — K219 Gastro-esophageal reflux disease without esophagitis: Secondary | ICD-10-CM | POA: Diagnosis present

## 2020-11-17 DIAGNOSIS — N179 Acute kidney failure, unspecified: Secondary | ICD-10-CM | POA: Diagnosis not present

## 2020-11-17 DIAGNOSIS — L97429 Non-pressure chronic ulcer of left heel and midfoot with unspecified severity: Secondary | ICD-10-CM | POA: Diagnosis not present

## 2020-11-17 DIAGNOSIS — Z85828 Personal history of other malignant neoplasm of skin: Secondary | ICD-10-CM

## 2020-11-17 DIAGNOSIS — I10 Essential (primary) hypertension: Secondary | ICD-10-CM

## 2020-11-17 DIAGNOSIS — E669 Obesity, unspecified: Secondary | ICD-10-CM | POA: Diagnosis present

## 2020-11-17 DIAGNOSIS — Z79899 Other long term (current) drug therapy: Secondary | ICD-10-CM

## 2020-11-17 DIAGNOSIS — E7849 Other hyperlipidemia: Secondary | ICD-10-CM | POA: Diagnosis not present

## 2020-11-17 DIAGNOSIS — M86179 Other acute osteomyelitis, unspecified ankle and foot: Secondary | ICD-10-CM | POA: Diagnosis not present

## 2020-11-17 DIAGNOSIS — Z96642 Presence of left artificial hip joint: Secondary | ICD-10-CM | POA: Diagnosis present

## 2020-11-17 DIAGNOSIS — Z818 Family history of other mental and behavioral disorders: Secondary | ICD-10-CM

## 2020-11-17 DIAGNOSIS — M1990 Unspecified osteoarthritis, unspecified site: Secondary | ICD-10-CM | POA: Diagnosis present

## 2020-11-17 DIAGNOSIS — Z8249 Family history of ischemic heart disease and other diseases of the circulatory system: Secondary | ICD-10-CM

## 2020-11-17 DIAGNOSIS — Z6826 Body mass index (BMI) 26.0-26.9, adult: Secondary | ICD-10-CM

## 2020-11-17 DIAGNOSIS — E162 Hypoglycemia, unspecified: Secondary | ICD-10-CM

## 2020-11-17 DIAGNOSIS — Z825 Family history of asthma and other chronic lower respiratory diseases: Secondary | ICD-10-CM

## 2020-11-17 LAB — CBC
HCT: 34.9 % — ABNORMAL LOW (ref 36.0–46.0)
Hemoglobin: 11.5 g/dL — ABNORMAL LOW (ref 12.0–15.0)
MCH: 28.8 pg (ref 26.0–34.0)
MCHC: 33 g/dL (ref 30.0–36.0)
MCV: 87.3 fL (ref 80.0–100.0)
Platelets: 233 10*3/uL (ref 150–400)
RBC: 4 MIL/uL (ref 3.87–5.11)
RDW: 15.8 % — ABNORMAL HIGH (ref 11.5–15.5)
WBC: 10.1 10*3/uL (ref 4.0–10.5)
nRBC: 0 % (ref 0.0–0.2)

## 2020-11-17 LAB — BASIC METABOLIC PANEL
Anion gap: 5 (ref 5–15)
BUN: 33 mg/dL — ABNORMAL HIGH (ref 8–23)
CO2: 29 mmol/L (ref 22–32)
Calcium: 9.1 mg/dL (ref 8.9–10.3)
Chloride: 104 mmol/L (ref 98–111)
Creatinine, Ser: 0.97 mg/dL (ref 0.44–1.00)
GFR, Estimated: >60 mL/min (ref >60)
Glucose, Bld: 123 mg/dL — ABNORMAL HIGH (ref 70–99)
Potassium: 4.3 mmol/L (ref 3.5–5.1)
Sodium: 138 mmol/L (ref 135–145)

## 2020-11-17 LAB — SEDIMENTATION RATE: Sed Rate: 37 mm/hr — ABNORMAL HIGH (ref 0–30)

## 2020-11-17 LAB — GLUCOSE, CAPILLARY: Glucose-Capillary: 235 mg/dL — ABNORMAL HIGH (ref 70–99)

## 2020-11-17 MED ORDER — INSULIN ASPART 100 UNIT/ML IJ SOLN
0.0000 [IU] | Freq: Three times a day (TID) | INTRAMUSCULAR | Status: DC
  Administered 2020-11-18: 2 [IU] via SUBCUTANEOUS
  Administered 2020-11-18: 3 [IU] via SUBCUTANEOUS
  Administered 2020-11-19 – 2020-11-20 (×2): 2 [IU] via SUBCUTANEOUS
  Administered 2020-11-21: 3 [IU] via SUBCUTANEOUS
  Administered 2020-11-21: 8 [IU] via SUBCUTANEOUS
  Administered 2020-11-21 – 2020-11-22 (×2): 3 [IU] via SUBCUTANEOUS
  Administered 2020-11-22: 2 [IU] via SUBCUTANEOUS
  Administered 2020-11-22 – 2020-11-24 (×3): 3 [IU] via SUBCUTANEOUS
  Filled 2020-11-17 (×9): qty 1

## 2020-11-17 MED ORDER — HYDROCODONE-ACETAMINOPHEN 5-325 MG PO TABS
1.0000 | ORAL_TABLET | Freq: Three times a day (TID) | ORAL | Status: DC | PRN
  Administered 2020-11-17 – 2020-11-18 (×2): 1 via ORAL
  Filled 2020-11-17 (×2): qty 1

## 2020-11-17 MED ORDER — ONDANSETRON HCL 4 MG PO TABS
4.0000 mg | ORAL_TABLET | Freq: Four times a day (QID) | ORAL | Status: DC | PRN
Start: 2020-11-17 — End: 2020-11-24

## 2020-11-17 MED ORDER — LOSARTAN POTASSIUM 25 MG PO TABS
25.0000 mg | ORAL_TABLET | Freq: Every day | ORAL | Status: DC
  Administered 2020-11-18 – 2020-11-19 (×2): 25 mg via ORAL
  Filled 2020-11-17 (×2): qty 1

## 2020-11-17 MED ORDER — MORPHINE SULFATE (PF) 2 MG/ML IV SOLN
0.5000 mg | INTRAVENOUS | Status: DC | PRN
  Administered 2020-11-18 (×2): 0.5 mg via INTRAVENOUS
  Filled 2020-11-17 (×2): qty 1

## 2020-11-17 MED ORDER — ATORVASTATIN CALCIUM 20 MG PO TABS
80.0000 mg | ORAL_TABLET | Freq: Every day | ORAL | Status: DC
  Administered 2020-11-17 – 2020-11-23 (×7): 80 mg via ORAL
  Filled 2020-11-17 (×7): qty 4

## 2020-11-17 MED ORDER — ACETAMINOPHEN 650 MG RE SUPP: 650.0000 mg | Freq: Four times a day (QID) | RECTAL | Status: AC | PRN

## 2020-11-17 MED ORDER — INSULIN GLARGINE-YFGN 100 UNIT/ML ~~LOC~~ SOLN
40.0000 [IU] | Freq: Every day | SUBCUTANEOUS | Status: DC
  Administered 2020-11-17: 40 [IU] via SUBCUTANEOUS
  Filled 2020-11-17 (×3): qty 0.4

## 2020-11-17 MED ORDER — ACETAMINOPHEN 500 MG PO TABS
1000.0000 mg | ORAL_TABLET | Freq: Four times a day (QID) | ORAL | Status: AC | PRN
  Administered 2020-11-18: 1000 mg via ORAL
  Filled 2020-11-17: qty 2

## 2020-11-17 MED ORDER — INSULIN ASPART 100 UNIT/ML IJ SOLN
0.0000 [IU] | Freq: Every day | INTRAMUSCULAR | Status: DC
  Administered 2020-11-17: 2 [IU] via SUBCUTANEOUS
  Filled 2020-11-17: qty 1

## 2020-11-17 MED ORDER — VITAMIN B-12 1000 MCG PO TABS
1000.0000 ug | ORAL_TABLET | Freq: Every day | ORAL | Status: DC
  Administered 2020-11-18 – 2020-12-01 (×13): 1000 ug via ORAL
  Filled 2020-11-17 (×13): qty 1

## 2020-11-17 MED ORDER — VANCOMYCIN HCL 1500 MG/300ML IV SOLN
1500.0000 mg | Freq: Once | INTRAVENOUS | Status: AC
  Administered 2020-11-18: 1500 mg via INTRAVENOUS
  Filled 2020-11-17: qty 300

## 2020-11-17 MED ORDER — HEPARIN SODIUM (PORCINE) 5000 UNIT/ML IJ SOLN
5000.0000 [IU] | Freq: Three times a day (TID) | INTRAMUSCULAR | Status: DC
  Administered 2020-11-17 – 2020-11-18 (×2): 5000 [IU] via SUBCUTANEOUS
  Filled 2020-11-17 (×2): qty 1

## 2020-11-17 MED ORDER — OXYBUTYNIN CHLORIDE ER 5 MG PO TB24
10.0000 mg | ORAL_TABLET | Freq: Every day | ORAL | Status: DC
  Administered 2020-11-17 – 2020-11-30 (×14): 10 mg via ORAL
  Filled 2020-11-17: qty 2
  Filled 2020-11-17 (×2): qty 1
  Filled 2020-11-17 (×2): qty 2
  Filled 2020-11-17: qty 1
  Filled 2020-11-17 (×5): qty 2
  Filled 2020-11-17: qty 1
  Filled 2020-11-17 (×2): qty 2

## 2020-11-17 MED ORDER — SODIUM CHLORIDE 0.9 % IV SOLN
2.0000 g | Freq: Two times a day (BID) | INTRAVENOUS | Status: DC
  Administered 2020-11-18 – 2020-11-20 (×5): 2 g via INTRAVENOUS
  Filled 2020-11-17 (×7): qty 2

## 2020-11-17 MED ORDER — PANTOPRAZOLE SODIUM 40 MG PO TBEC
80.0000 mg | DELAYED_RELEASE_TABLET | Freq: Every day | ORAL | Status: DC
  Administered 2020-11-18 – 2020-12-01 (×13): 80 mg via ORAL
  Filled 2020-11-17 (×13): qty 2

## 2020-11-17 MED ORDER — ONDANSETRON HCL 4 MG/2ML IJ SOLN
4.0000 mg | Freq: Four times a day (QID) | INTRAMUSCULAR | Status: DC | PRN
  Administered 2020-11-20 – 2020-11-24 (×3): 4 mg via INTRAVENOUS
  Filled 2020-11-17 (×3): qty 2

## 2020-11-17 MED ORDER — DEXTROSE 50 % IV SOLN: 1.0000 | INTRAVENOUS | Status: AC | PRN

## 2020-11-17 MED ORDER — METOPROLOL SUCCINATE ER 25 MG PO TB24
25.0000 mg | ORAL_TABLET | Freq: Every day | ORAL | Status: DC
  Administered 2020-11-18 – 2020-12-01 (×13): 25 mg via ORAL
  Filled 2020-11-17 (×14): qty 1

## 2020-11-17 NOTE — Patient Instructions (Addendum)
Thank you for allowing the Chronic Care Management team to participate in your care.   Goals Addressed: Patient Care Plan: Fall Risk (Adult)     Problem Identified: Fall Risk      Long-Range Goal: Absence of Fall and Fall-Related Injury   Start Date: 11/17/2020  Expected End Date: 03/17/2021  Priority: High  Note:   Fall Risk  11/17/2020 06/05/2020 07/09/2019 12/28/2018 12/19/2017  Falls in the past year? 0 1 0 0 No  Number falls in past yr: 0 1 0 0 -  Injury with Fall? - 1 0 0 -  Risk for fall due to : Impaired balance/gait;Medication side effect;Other (Comment);Orthopedic patient History of fall(s) - Impaired balance/gait;Impaired mobility -  Risk for fall due to: Comment Reports Hip replacement in June 2022 - - - -  Follow up Falls prevention discussed Falls evaluation completed - Falls evaluation completed -   Current Barriers:  High Risk for Falls d/t  Impaired Gait  Clinical Goal(s):  Over the next 120 days, patient will not experience falls or require hospitalization d/t fall related injuries.  Interventions:  Collaboration with [name], FNP regarding development and update of comprehensive plan of care as evidenced by provider attestation and co-signature Inter-disciplinary care team collaboration (see longitudinal plan of care) Reviewed medications and discussed potential side effects that may increase risk for falls. Provided information regarding safety and fall prevention. Reports currently being unable to bear weight on her left extremity but has a rolling walker to use if needed. Uses a wheelchair outside of the home. Currently receiving Home Health physical and occupational therapy services. Receiving nursing services for dressing changes to wound on her left heel. Reports following activity restrictions as recommended by the physical therapy team. Discussed ability to perform ADL's and tasks in the home. Unable to perform several tasks in the home but reports  performing ADL's independently. Reports a daughter is available to assist in the home as needed. Declined need for additional in-home assistance. She voiced concerns regarding wheelchair accessible transportation for two pending appointments.  Referral placed for transportation assistance.  Self-Care/Patient Goals:  Utilize assistive device appropriately with all ambulation Ensure pathways are clear and well lit Change positions slowly and use caution when ambulating Wear secure fitting, skid free footwear when ambulating Follow activity restrictions as advised by the [place] team Notify provider or care management team for questions and new concerns as needed     Follow Up Plan:  Will follow up next month    Patient Care Plan: Hyperlipidemia and Hypertension (Adult)     Problem Identified: Hyperlipidemia and Hypertension       Long-Range Goal: Hyperlipidemia and Hypertension Monitored   Start Date: 11/17/2020  Expected End Date: 03/17/2021  Priority: Medium  Note:   Current Barriers:  Chronic Disease Management support and educational needs related to Hypertension and Hyperlipidemia   Case Manager Clinical Goal(s):  Over the next 120 days, patient will demonstrate adherence to prescribed treatment plan evidenced by taking all medications as prescribed, monitoring and recording blood pressure and adhering to a low sodium/DASH diet.   Interventions:  Collaboration with [name], FNP regarding development and update of comprehensive plan of care as evidenced by provider attestation and co-signature Inter-disciplinary care team collaboration (see longitudinal plan of care) Reviewed medications.  Advised to take all medications as prescribed. Advised to notify the care management team with concerns regarding medication management or prescription cost. Provided information regarding established blood pressure parameters  along with indications for notifying  a provider. Encouraged to monitor and record readings. Reports recent home readings have been within range. Discussed compliance with the recommended cardiac prudent diet. Encouraged to read nutrition labels, monitor sodium intake and avoid highly processed foods when possible. Reviewed s/sx of heart attack, stroke and worsening symptoms that require immediate medical attention.   Patient Goals/Self-Care Activities: Self-administer medications as prescribed Monitor and record blood pressure Adhere to recommended cardiac prudent/heart healthy diet Notify provider or care management team with questions and new concerns as needed    Follow Up Plan:  Will follow up next month    Patient Care Plan: Diabetes Type 2 (Adult)     Problem Identified: Glycemic Management (Diabetes, Type 2)      Long-Range Goal: Glycemic Management Optimized   Start Date: 11/17/2020  Expected End Date: 03/17/2021  Priority: High  Note:    Current Barriers:  Chronic Disease Management support and educational needs related to Diabetes self management.  Case Manager Clinical Goal(s):  Over the next 120 days, patient will demonstrate improved adherence to prescribed treatment plan for diabetes self care/management as evidenced by taking medications as prescribed, daily monitoring and recording of CBG's and adherence to an ADA/ carb modified diet.   Interventions:  Collaboration with [name], FNP regarding development and update of comprehensive plan of care as evidenced by provider attestation and co-signature Inter-disciplinary care team collaboration (see longitudinal plan of care) Reviewed medications and importance of compliance. Advised to take all medications as prescribed and notify the care management team with concerns regarding medication management or prescription cost. Will provide update if assistance is needed for Trulicity. Provided information regarding importance of consistent blood  glucose monitoring. Encouraged to monitor and maintain a log. Reviewed s/sx of hypoglycemia and hyperglycemia along with recommended interventions. Reports readings have ranged in the 80's to 220's. Reports fasting reading today of 85 mg/dl.  Discussed nutritional intake and importance of complying with a carb modified/DM diet. Discussed and offered referrals for available Diabetes education classes. Declined need for additional referrals. Will provide additional information regarding nutrition options for optimal glycemic control. Discussed importance of completing recommended DM preventive care. Reports exams are up to date. She will complete an annual eye exam later this year. Discussed importance of completing daily foot care. Discussed increase risk of delayed wound healing r/t diabetes. Advised to ensure dressing to left heel are completed as scheduled.     Patient Goals/Self-Care Activities Self-administer medications as prescribed Attend all scheduled provider appointments Monitor blood glucose levels consistently and utilize recommended interventions Adhere to prescribed ADA/carb modified Notify provider or care management team with questions and new concerns as needed    Follow Up Plan:  Will follow up next month      Andrea [name] verbalized understanding of the information discussed during the telephonic outreach today. Declined need for mailed/printed instructions. A member of the care management team will follow up within the next month.   Crist[facility] Care Management [facility] [phone]

## 2020-11-17 NOTE — Telephone Encounter (Signed)
Spoke to pt's daughter Laury Axon) and informed her to go to ED for evaluation and tx of  left heel ulcer with  osteomyelitis. Pt's daughter verbalized understanding.

## 2020-11-17 NOTE — ED Triage Notes (Signed)
Pt to ED for wound to left heel for 3 months. States had hip surgery and heel was scraped by accident.  diabetic

## 2020-11-17 NOTE — Chronic Care Management (AMB) (Addendum)
Chronic Care Management   CCM RN Visit Note  11/17/2020 Name: Andrea [name] MRN: [id number] DOB: 11-Jun-1945  Subjective: Andrea [name] is a 75 y.o. year old female who is a primary care patient of [name], FNP. The care management team was consulted for assistance with disease management and care coordination needs.    Engaged with patient by telephone for initial visit in response to provider referral for case management and care coordination services.   Consent to Services:  The patient was given the following information about Chronic Care Management services: 1. CCM service includes personalized support from designated clinical staff supervised by the primary care provider, including individualized plan of care and coordination with other care providers 2. 24/7 contact phone numbers for assistance for urgent and routine care needs. 3. Service will only be billed when office clinical staff spend 20 minutes or more in a month to coordinate care. 4. Only one practitioner may furnish and bill the service in a calendar month. 5.The patient may stop CCM services at any time (effective at the end of the month) by phone call to the office staff. 6. The patient will be responsible for cost sharing (co-pay) of up to 20% of the service fee (after annual deductible is met). Patient agreed to services and consent obtained.    Assessment: Review of patient past medical history, allergies, medications, health status, including review of consultants reports, laboratory and other test data, was performed as part of comprehensive evaluation and provision of chronic care management services.   SDOH (Social Determinants of Health) assessments and interventions performed:  SDOH Interventions    Flowsheet Row Most Recent Value  SDOH Interventions   Food Insecurity Interventions Intervention Not Indicated  Transportation Interventions [id number] Referral         CCM Care Plan  Allergies   Allergen Reactions   Metformin And Related Diarrhea   Sulfa Antibiotics Rash    Outpatient Encounter Medications as of 11/17/2020  Medication Sig   atorvastatin (LIPITOR) 80 MG tablet TAKE 1 TABLET BY MOUTH EVERY DAY   Calcium Carbonate-Vitamin D 600-400 MG-UNIT tablet Take 1 tablet by mouth 2 (two) times daily.   cephALEXin (KEFLEX) 500 MG capsule Take 1 capsule (500 mg total) by mouth 4 (four) times daily.   collagenase (SANTYL) ointment Apply 1 application topically daily.   Cyanocobalamin (B-12) 1000 MCG CAPS Take 1,000 mcg by mouth daily.    furosemide (LASIX) 20 MG tablet Take 1 tablet (20 mg total) by mouth daily.   HYDROcodone-acetaminophen (NORCO/VICODIN) 5-325 MG tablet Take 1 tablet by mouth every 8 (eight) hours as needed for moderate pain.   LANTUS SOLOSTAR 100 UNIT/ML Solostar Pen Inject 40 Units into the skin at bedtime.   losartan (COZAAR) 25 MG tablet TAKE 1 TABLET BY MOUTH EVERY DAY   metoprolol succinate (TOPROL-XL) 25 MG 24 hr tablet TAKE 1 TABLET BY MOUTH EVERY DAY   Multiple Vitamins-Minerals (PRESERVISION AREDS 2 PO) Take 1 tablet by mouth in the morning and at bedtime.   Omega-3 1000 MG CAPS Take 1,000 mg by mouth daily.   omeprazole (PRILOSEC) 40 MG capsule TAKE 1 CAPSULE BY MOUTH EVERY DAY   oxybutynin (DITROPAN-XL) 10 MG 24 hr tablet TAKE 1 TABLET BY MOUTH EVERYDAY AT BEDTIME   potassium chloride (KLOR-CON) 10 MEQ tablet Take 1 tablet (10 mEq total) by mouth daily.   TRULICITY 1.5 ZD/6.3OV SOPN INJECT 1.5MG INTO THE SKIN ONCE A WEEK AS DIRECTED   Vitamin D, Ergocalciferol, (  DRISDOL) 1.25 MG (50000 UNIT) CAPS capsule TAKE ONE CAPSULE BY MOUTH ONCE WEEKLY ON THE SAME DAY EACH WEEK (ON FRIDAYS)   Alcohol Swabs (ALCOHOL PREP) PADS Use twice daily prior to SQ injection of insulin to clean skin   Barberry-Oreg Grape-Goldenseal (BERBERINE COMPLEX PO) Take 1 tablet by mouth 2 (two) times daily with a meal.   enoxaparin (LOVENOX) 40 MG/0.4ML injection Inject 0.4 mLs (40  mg total) into the skin daily for 14 days.   Hypertonic Nasal Wash (SINUS RINSE NA) Place 1 application into the nose at bedtime. (Patient not taking: Reported on 11/17/2020)   Insulin Pen Needle (TRUEPLUS PEN NEEDLES) 31G X 6 MM MISC Use with lantus two times daily.   INSULIN SYRINGE 1CC/29G 29G X 1/2" 1 ML MISC For lantus injections twice daily   OneTouch Delica Lancets 61W MISC To check blood sugar daily  DX: E11.9   ONETOUCH VERIO test strip TO CHECK BLOOD SUGAR ONCE DAILY.   No facility-administered encounter medications on file as of 11/17/2020.    Patient Active Problem List   Diagnosis Date Noted   Cellulitis of left lower extremity 11/13/2020   Hypoglycemia 11/13/2020   Non healing left heel wound 11/13/2020   Status post total hip replacement, 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   Pain due to onychomycosis of toenail of left foot 03/06/2020   Malaise and fatigue 12/28/2019   UTI (urinary tract infection) 12/28/2019   Hyponatremia 12/27/2019   Intertrigo 12/27/2019   Acute respiratory failure with hypoxia ([place]) 11/23/2019   History of meningitis 11/23/2019   CSF leak from nose 11/22/2019   PAD (peripheral artery disease) ([place]) 12/28/2018   Moderate episode of recurrent major depressive disorder ([place]) 12/19/2017   Leg length discrepancy 09/21/2017   Coronary artery disease involving native coronary artery 01/06/2017   Drug-induced constipation 01/06/2017   Spinal stenosis of lumbar region 01/06/2017   Spondylolisthesis of lumbar region 12/30/2016   Obese 04/16/2015   History of colon polyps 11/23/2012   Bundle branch block, 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   Personal history of transient ischemic attack (TIA), and cerebral infarction without residual deficits 11/23/2012   Encounter for long-term (current) use of aspirin 11/23/2012   Status post percutaneous transluminal coronary angioplasty 11/23/2012   Transient ischemic attack (TIA), and cerebral infarction without residual deficits(V12.54) 11/23/2012    Anemia 08/17/2012   Chronic obstructive pulmonary disease ([place]) 08/17/2012   HLD (hyperlipidemia) 08/17/2012   OP (osteoporosis) 08/17/2012   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 edema, with long-term current use of insulin ([place]) 08/17/2012   Ischemic heart disease due to coronary artery obstruction ([place]) 07/24/2012    Conditions to be addressed/monitored:HTN, HLD, and DMII Patient Care Plan: Fall Risk (Adult)     Problem Identified: Fall Risk      Long-Range Goal: Absence of Fall and Fall-Related Injury   Start Date: 11/17/2020  Expected End Date: 03/17/2021  Priority: High  Note:   Fall Risk  11/17/2020 06/05/2020 07/09/2019 12/28/2018 12/19/2017  Falls in the past year? 0 1 0 0 No  Number falls in past yr: 0 1 0 0 -  Injury with Fall? - 1 0 0 -  Risk for fall due to : Impaired balance/gait;Medication side effect;Other (Comment);Orthopedic patient History of fall(s) - Impaired balance/gait;Impaired mobility -  Risk for fall due to: Comment Reports Hip replacement in June 2022 - - - -  Follow up Falls prevention discussed Falls evaluation completed - Falls evaluation completed -  Current Barriers:  High Risk for Falls d/t  Impaired Gait  Clinical Goal(s):  Over the next 120 days, patient will not experience falls or require hospitalization d/t fall related injuries.  Interventions:  Collaboration with [name], FNP regarding development and update of comprehensive plan of care as evidenced by provider attestation and co-signature Inter-disciplinary care team collaboration (see longitudinal plan of care) Reviewed medications and discussed potential side effects that may increase risk for falls. Provided information regarding safety and fall prevention. Reports currently being unable to bear weight on her left extremity but has a rolling walker to use if needed. Uses a wheelchair outside of the home. Currently  receiving Home Health physical and occupational therapy services. Receiving nursing services for dressing changes to wound on her left heel. Reports following activity restrictions as recommended by the physical therapy team. Discussed ability to perform ADL's and tasks in the home. Unable to perform several tasks in the home but reports performing ADL's independently. Reports a daughter is available to assist in the home as needed. Declined need for additional in-home assistance. She voiced concerns regarding wheelchair accessible transportation for two pending appointments.  Referral placed for transportation assistance.  Self-Care/Patient Goals:  Utilize assistive device appropriately with all ambulation Ensure pathways are clear and well lit Change positions slowly and use caution when ambulating Wear secure fitting, skid free footwear when ambulating Follow activity restrictions as advised by the [place] team Notify provider or care management team for questions and new concerns as needed     Follow Up Plan:  Will follow up next month    Patient Care Plan: Hyperlipidemia and Hypertension (Adult)     Problem Identified: Hyperlipidemia and Hypertension (Hypertension)      Long-Range Goal: Hyperlipidemia and Hypertension Monitored   Start Date: 11/17/2020  Expected End Date: 03/17/2021  Priority: Medium  Note:   Current Barriers:  Chronic Disease Management support and educational needs related to Hypertension and Hyperlipidemia   Case Manager Clinical Goal(s):  Over the next 120 days, patient will demonstrate adherence to prescribed treatment plan as evidenced by taking all medications as prescribed, monitoring and recording blood pressure and adhering to a low sodium/DASH diet.   Interventions:  Collaboration with [name], FNP regarding development and update of comprehensive plan of care as evidenced by provider attestation and  co-signature Inter-disciplinary care team collaboration (see longitudinal plan of care) Reviewed medications.  Advised to take all medications as prescribed. Advised to notify the care management team with concerns regarding medication management or prescription cost. Provided information regarding established blood pressure parameters along with indications for notifying a provider. Encouraged to monitor and record readings. Reports recent home readings have been within range. Discussed compliance with the recommended cardiac prudent diet. Encouraged to read nutrition labels, monitor sodium intake and avoid highly processed foods when possible. Reviewed s/sx of heart attack, stroke and worsening symptoms that require immediate medical attention.   Patient Goals/Self-Care Activities: Self-administer medications as prescribed Monitor and record blood pressure Adhere to recommended cardiac prudent/heart healthy diet Notify provider or care management team with questions and new concerns as needed    Follow Up Plan:  Will follow up next month    Patient Care Plan: Diabetes Type 2 (Adult)     Problem Identified: Glycemic Management (Diabetes, Type 2)      Long-Range Goal: Glycemic Management Optimized   Start Date: 11/17/2020  Expected End Date: 03/17/2021  Priority: High  Note:  Current Barriers:  Chronic Disease Management support and educational needs related to Diabetes self management.  Case Manager Clinical Goal(s):  Over the next 120 days, patient will demonstrate improved adherence to prescribed treatment plan for diabetes self care/management as evidenced by taking medications as prescribed, daily monitoring and recording of CBG's and adherence to an ADA/ carb modified diet.   Interventions:  Collaboration with [name], FNP regarding development and update of comprehensive plan of care as evidenced by provider attestation and co-signature Inter-disciplinary care  team collaboration (see longitudinal plan of care) Reviewed medications and importance of compliance. Advised to take all medications as prescribed and notify the care management team with concerns regarding medication management or prescription cost. Will provide update if assistance is needed for Trulicity. Provided information regarding importance of consistent blood glucose monitoring. Encouraged to monitor and maintain a log. Reviewed s/sx of hypoglycemia and hyperglycemia along with recommended interventions. Reports readings have ranged in the 80's to 220's. Reports fasting reading today of 85 mg/dl.  Discussed nutritional intake and importance of complying with a carb modified/DM diet. Discussed and offered referrals for available Diabetes education classes. Declined need for additional referrals. Will provide additional information regarding nutrition options for optimal glycemic control. Discussed importance of completing recommended DM preventive care. Reports exams are up to date. She will complete an annual eye exam later this year. Discussed importance of completing daily foot care. Discussed increase risk of delayed wound healing r/t diabetes. Advised to ensure dressing to left heel are completed as scheduled.     Patient Goals/Self-Care Activities Self-administer medications as prescribed Attend all scheduled provider appointments Monitor blood glucose levels consistently and utilize recommended interventions Adhere to prescribed ADA/carb modified Notify provider or care management team with questions and new concerns as needed    Follow Up Plan:  Will follow up next month      PLAN A member of the care management team will follow up within the next month.   Cristy [facility] Care Management [facility] [phone]

## 2020-11-17 NOTE — H&P (Addendum)
History and Physical   Andrea [name] [id number] DOB: [birth year] DOA: 11/17/2020  PCP: [name], FNP  Outpatient Specialists: Dr. Rudene Christians, orthopedic surgeon; Dr. Boneta Lucks, podiatrist Patient coming from: Home  I have personally briefly reviewed patient's old medical records in [place].  Chief Concern: Osteomyelitis of the left heel  HPI: Andrea [name] is a 75 y.o. female with medical history significant for insulin-dependent diabetes mellitus, hypertension, hyperlipidemia, GERD, history of meningitis, CAD, COPD, history of TIA, grade 1 diastolic dysfunction, depression, anxiety, osteoarthritis, who presents to the emergency department from home at the request of her PCP for chief concerns of osteomyelitis of the left heel.  She states that the wound has been there for 3 months and she has taken antibiotics in it and has not resolved.  She states that this started after her orthopedic boot was taken off and she thinks that there might have been a scratch.  She states that the wound has progressively worsened prompting her to present to her PCP.  Her PCP ordered a foot x-ray on 11/13/2020 and resulted today.  She endorses inability to bear weight due to the pain.  She denies fever, headaches, vision changes, pain with swallowing, dysphagia, nausea, vomiting, chest pain, shortness of breath, dysuria, hematuria, abdominal pain, syncope, loss of consciousness.  She endorses in the last 2-3 months, she had unintentional weight loss of 10 pounds and decreased appetite.  She endorses being up-to-date on colonoscopy and mammograms.  Social history: She lives at home with her husband. She denies tobacco, EtOH, recreational drug use.  She is retired and formerly worked in a [employer].  Vaccination history: She is vaccinated for COVID-19 with 2 doses of Pfizer  ROS: Constitutional: + weight change, no fever ENT/Mouth: no sore throat, no rhinorrhea Eyes: no eye pain, no vision  changes Cardiovascular: no chest pain, no dyspnea,  no edema, no palpitations Respiratory: no cough, no sputum, no wheezing Gastrointestinal: no nausea, no vomiting, no diarrhea, no constipation Genitourinary: no urinary incontinence, no dysuria, no hematuria Musculoskeletal: no arthralgias, no myalgias Skin: + skin lesions, + erythema, + swelling Neuro: + weakness, no loss of consciousness, no syncope Psych: no anxiety, no depression, + decrease appetite Heme/Lymph: no bruising, no bleeding  ED Course: Discussed with emergency medicine provider, patient requiring hospitalization for chief concerns of osteomyelitis.  Vitals in the emergency department was remarkable for temperature 98, respiration of 20, heart rate of 102, and blood pressure 135/71, SPO2 of 99% on room air.  No treatments were initiated in the emergency department.  Assessment/Plan  Principal Problem:   Osteomyelitis (HCC) Active Problems:   Chronic obstructive pulmonary disease (HCC)   HLD (hyperlipidemia)   Coronary artery disease involving native coronary artery   PAD (peripheral artery disease) (HCC)   History of meningitis   Status post total hip replacement, left   Cellulitis of left lower extremity   # Left heel osteomyelitis-I suspect this is secondary to pressure ulcer in setting of recent orthopedic surgery - Patient does not meet sepsis criteria - Check baseline sed rate, CRP - Check MRSA - Blood cultures x2 - Vancomycin and cefepime per pharmacy for osteomyelitis - Image of wound at bedside uploaded to media - A.m. team to consult podiatry  # Insulin-dependent diabetes mellitus-Lantus 40 units nightly resumed - Insulin SSI with at bedtime coverage ordered - D50, 1 amp, as needed for hypoglycemia, 1 day coverage ordered  # Hypertension-patient takes losartan 25 mg daily, metoprolol succinate 25  mg daily # Hyperlipidemia-atorvastatin 80 mg nightly resumed # Overactive bladder-resumed home  oxybutynin 10 mg nightly # GERD-PPI  # History of meningitis and CSF leak - please below for detailed recounting of her hospital course at [facility] in September 2021  Chart reviewed.   Vascular ultrasound ABI with and without TBI on 10/31/2020: Was read as resting right ankle-brachial index is within normal limits.  No evidence of significant right lower extremity arterial disease.  Right toe brachial index is normal.  Resting left ankle-brachial index is within normal limits.  No evidence of significant left lower extremity arterial disease.  The left toe brachial index is normal.  11/15/2019 - 11/24/2019: Admitted to [facility] for fever, altered mental status, leukocytosis to 21.5K, presumed secondary to sepsis.  There was concern of meningitis, patient declined lumbar puncture.  She was treated with cefepime, Vanco, ampicillin, metronidazole, acyclovir.  Blood cultures was positive for strep pneumonia and her antibiotics were slowly de-escalated.  Antibiotics were de-escalated to vancomycin and ceftriaxone.  She clinically improved with resumption of normal status however had lingering headache of 6/10 on most days.  She needed morphine for relief.  She was seen by infectious disease who recommended continue ceftriaxone for 14 days until the MIC for his pneumococcus returned.  For 1 sensitivities returned she was de-escalated to penicillin, however once CSF leak was discovered, she was broadened back to Rocephin.  A beta-2 transferrin was sent for testing and ultimately returned positive.  She clinically improved with downtrending WBC, remained afebrile, improving O2 status down to 2 L, no significant cough or shortness of breath.  However repeat chest x-ray showed increased opacity without clinical deterioration.  It was presumed the CSF source was the etiology of the bacteremia, TEE was considered no longer indicated by ID.  A CT cisternogram on 11/20/2019 confirmed the leak.  11/24/2019 to 12/01/2019: Patient was  transferred from [facility] to [facility] on 11/24/2019 due to CSF leak complicated by meningitis.  She presented to [place] on 11/15/2019 for altered mental status, fever, elevated white cell count concerning for sepsis.  There was concern for meningitis on her presentation, however she refused the lumbar puncture.  She then developed bilateral pneumonia and volume overload secondary to pulmonary edema.  Blood cultures were obtained and she was treated for presumed meningitis with broad-spectrum antibiotics including acyclovir, metronidazole, ampicillin, vancomycin, and cefepime.  Blood cultures demonstrated strep pneumo and her treatment was narrowed to vancomycin and ceftriaxone.  On 11/19/2019: She was discovered to have clear nasal drainage from left side of her nose which was suspected to be CSF.  This was collected and sent for beta-2 transferrin testing.  It came back positive for CSF.  She was then transferred to [facility].  Her treatment for meningitis and pneumonia were continued.  Infectious disease team was consulted for assistance with management.  Her WBC down trended to normal and repeat cultures on 9/19 remain negative.  For the duration of her admission at that time, she denied active drainage or salty/metallic taste and her mental status remained intact.  Per recommendations of ID team, repeat blood cultures were obtained at her antibiotic regimen was narrowed to ceftriaxone for total 14 days treatment course.  Patient was at that point transition to amoxicillin 500 mg twice daily for meningitis prophylaxis in the setting of CSF leak until the source of her leak was repaired.  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  Internal medicine team was consulted for assistance in optimization of patient since pulmonary status in preparation for CSF leak repair.  This is  necessary to minimize positive airway pressure on the repair, which would then cause pneumocephalus.  She was started on duo nebs, 3% saline nebs, as well as chest PT and I-S.  PT/OT were consulted to resume ambulatory activities.  Endocrine team was consulted at that time for management of patient's diabetes throughout the admission.  Patient was discharge. CSF repair was planned for 9/30.  12/06/2019 to 12/09/2019: Patient was admitted under Otolaryngology service for CSF rhinorrhea repair.  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  Post procedure, she was transferred to ISCU where she was monitored closely while remaining on bedrest for 24 hours.  On postop day 1, she was transferred to medicine floor. She was started on DVT prophylaxis on postop day 1.  She was treated with Diamox starting with postop day 0. She was deemed appropriate for discharge on postop day 3.  She went home with home health, home PT/OT, she would discharge on 10-day course of amoxicillin and to complete 7-day course of Diamox.  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   Depression    Diabetes mellitus type 2, uncomplicated ([place])    Diabetic retinopathy ([place])    Dupuytren contracture 2011   s/p surgery to LEFT hand   Frequent urinary tract infections    GERD (gastroesophageal reflux disease)    Grade I diastolic  dysfunction    HTN (hypertension)    Hyperlipidemia, unspecified    Irritable bowel syndrome    Meningitis 11/2019   MI (myocardial infarction) ([place]) 1994   no stent   Neuromuscular disorder (HCC)    nerve pain in back and legs   Osteoporosis    Pneumonia 2021   RBBB    Sepsis ([place]) 11/2019   Skin cancer    TIA (transient ischemic attack) 2014   Vitamin D deficiency, unspecified    Wheezing    Past Surgical History:  Procedure Laterality Date   BACK SURGERY     lower due to spinal stenosis   CARDIAC CATHETERIZATION  1994   CATARACT EXTRACTION, BILATERAL Bilateral    COLONOSCOPY     x3   COLONOSCOPY WITH PROPOFOL N/A 02/08/2019   Procedure: COLONOSCOPY WITH PROPOFOL;  Surgeon: [name], MD;  Location: [facility] ENDOSCOPY;  Service: Gastroenterology;  Laterality: N/A;   CORONARY ANGIOPLASTY  1994   DUPUYTREN CONTRACTURE RELEASE Left 2011   HARDWARE REMOVAL Left 08/19/2020   Procedure: HARDWARE REMOVAL;  Surgeon: Hessie Knows, MD;  Location: [facility] ORS;  Service: Orthopedics;  Laterality: Left;   HIP FRACTURE SURGERY  11/2013   PR COLSC FLX W/REMOVAL LESION BY HOT BX FORCEPS   08/21/2015   Procedure: COLONOSCOPY, FLEXIBLE, PROXIMAL TO SPLENIC FLEXURE; W/REMOVAL TUMOR/POLYP/OTHER LESION,  HOT BX FORCEP/CAUTE; Surgeon: Carlena Hurl, MD; Location: OR [place]; Service: General Surgery   REPAIR DURAL / CSF LEAK     to left sinus   TEE WITHOUT CARDIOVERSION N/A 11/20/2019   Procedure: TRANSESOPHAGEAL ECHOCARDIOGRAM (TEE);  Surgeon: Nelva Bush, MD;  Location: [facility] ORS;  Service: Cardiovascular;  Laterality: N/A;   TOTAL HIP ARTHROPLASTY Left 08/19/2020   Procedure: TOTAL HIP ARTHROPLASTY ANTERIOR APPROACH;  Surgeon: Hessie Knows, MD;  Location: [facility] ORS;  Service: Orthopedics;  Laterality: Left;   Social History:  reports that she quit smoking about 8 years ago. Her smoking use included cigarettes. She has a 30.00 pack-year smoking history. She has never used smokeless  tobacco. She reports that she does not currently use alcohol. She reports that she does not use drugs.  Allergies  Allergen Reactions   Metformin And Related Diarrhea   Sulfa Antibiotics Rash   Family History  Problem Relation Age of Onset   Depression Daughter    Arthritis Daughter        spine   Plantar fasciitis Daughter    Anxiety disorder Daughter    Hyperlipidemia Daughter    AAA (abdominal aortic aneurysm) Daughter    Hypertension Mother    Stroke Mother    Cataracts Mother    Depression Mother    Heart disease Mother    Asthma Brother    Diabetes Daughter    Hyperlipidemia Daughter    Hypertension Daughter    Breast cancer Cousin        maternal   Family history: Family history reviewed and not pertinent  Prior to Admission medications   Medication Sig Start Date End Date Taking? Authorizing Provider  Alcohol Swabs (ALCOHOL PREP) PADS Use twice daily prior to SQ injection of insulin to clean skin 10/02/15   Mar Daring, PA-C  atorvastatin (LIPITOR) 80 MG tablet TAKE 1 TABLET BY MOUTH EVERY DAY 10/09/20   Gollan, Kathlene November, MD  Barberry-Oreg Grape-Goldenseal (BERBERINE COMPLEX PO) Take 1 tablet by mouth 2 (two) times daily with a meal.    [provider]  Calcium Carbonate-Vitamin D 600-400 MG-UNIT tablet Take 1 tablet by mouth 2 (two) times daily.    [provider]  cephALEXin (KEFLEX) 500 MG capsule Take 1 capsule (500 mg total) by mouth 4 (four) times daily. 11/13/20   [name], FNP  collagenase (SANTYL) ointment Apply 1 application topically daily. 11/06/20   Felipa Furnace, DPM  Cyanocobalamin (B-12) 1000 MCG CAPS Take 1,000 mcg by mouth daily.     [provider]  enoxaparin (LOVENOX) 40 MG/0.4ML injection Inject 0.4 mLs (40 mg total) into the skin daily for 14 days. 08/22/20 09/05/20  Duanne Guess, PA-C  furosemide (LASIX) 20 MG tablet Take 1 tablet (20 mg total) by mouth daily. 07/03/20   Virginia Crews, MD   HYDROcodone-acetaminophen (NORCO/VICODIN) 5-325 MG tablet Take 1 tablet by mouth every 8 (eight) hours as needed for moderate pain. 11/13/20   [name], FNP  Hypertonic Nasal Wash (SINUS RINSE NA) Place 1 application into the nose at bedtime. Patient not taking: Reported on 11/17/2020    [provider]  Insulin Pen Needle (TRUEPLUS PEN NEEDLES) 31G X 6 MM MISC Use with lantus two times daily. 08/05/20   Virginia Crews, MD  INSULIN SYRINGE 1CC/29G 29G X 1/2" 1 ML MISC For lantus injections twice daily 10/15/19   Mar Daring, PA-C  LANTUS SOLOSTAR 100 UNIT/ML Solostar Pen Inject 40 Units  into the skin at bedtime. 11/13/20   [name], FNP  losartan (COZAAR) 25 MG tablet TAKE 1 TABLET BY MOUTH EVERY DAY 10/09/20   Bacigalupo, Dionne Bucy, MD  metoprolol succinate (TOPROL-XL) 25 MG 24 hr tablet TAKE 1 TABLET BY MOUTH EVERY DAY 05/29/20   Mar Daring, PA-C  Multiple Vitamins-Minerals (PRESERVISION AREDS 2 PO) Take 1 tablet by mouth in the morning and at bedtime.    [provider]  Omega-3 1000 MG CAPS Take 1,000 mg by mouth daily.    [provider]  omeprazole (PRILOSEC) 40 MG capsule TAKE 1 CAPSULE BY MOUTH EVERY DAY 10/19/20   Bacigalupo, Dionne Bucy, MD  OneTouch Delica Lancets 99991111 MISC To check blood sugar daily  DX: E11.9 07/31/20   Bacigalupo, Dionne Bucy, MD  ONETOUCH VERIO test strip TO CHECK BLOOD SUGAR ONCE DAILY. 10/25/19   Mar Daring, PA-C  oxybutynin (DITROPAN-XL) 10 MG 24 hr tablet TAKE 1 TABLET BY MOUTH EVERYDAY AT BEDTIME 10/07/20   Bacigalupo, Dionne Bucy, MD  potassium chloride (KLOR-CON) 10 MEQ tablet Take 1 tablet (10 mEq total) by mouth daily. 06/05/20   Mar Daring, PA-C  TRULICITY 1.5 0000000 SOPN INJECT 1.'5MG'$  INTO THE SKIN ONCE A WEEK AS DIRECTED 07/21/20   Bacigalupo, Dionne Bucy, MD  Vitamin D, Ergocalciferol, (DRISDOL) 1.25 MG (50000 UNIT) CAPS capsule TAKE ONE CAPSULE BY MOUTH ONCE WEEKLY ON THE SAME DAY EACH WEEK (ON  FRIDAYS) 06/30/20   Jerrol Banana., MD   Physical Exam: Vitals:   11/17/20 [id number] 11/17/20 1745  BP:  135/71  Pulse:  (!) 102  Resp:  20  Temp:  98 F (36.7 C)  TempSrc:  Oral  SpO2:  99%  Weight: 62.6 kg   Height: 5' (1.524 m)    Constitutional: appears age-appropriate, NAD, calm, comfortable Eyes: PERRL, lids and conjunctivae normal ENMT: Mucous membranes are moist. Posterior pharynx clear of any exudate or lesions. Age-appropriate dentition. Hearing appropriate Neck: normal, supple, no masses, no thyromegaly Respiratory: clear to auscultation bilaterally, no wheezing, no crackles. Normal respiratory effort. No accessory muscle use.  Cardiovascular: Regular rate and rhythm, no murmurs / rubs / gallops.  Bilateral 1+ pitting edema of the lower extremity. 2+ pedal pulses. No carotid bruits.  Abdomen: Obese abdomen, no tenderness, no masses palpated, no hepatosplenomegaly. Bowel sounds positive.  Musculoskeletal: no clubbing / cyanosis. No joint deformity upper and lower extremities. Good ROM, no contractures, no atrophy. Normal muscle tone.  Skin: + lesions, + erythema, edema. + odor. + drainage  Neurologic: Sensation intact. Strength 5/5 in all 4.  Psychiatric: Normal judgment and insight. Alert and oriented x 3. Normal mood.   EKG: ordered  Chest x-ray on Admission: I personally reviewed and I agree with radiologist reading as below.  DG Foot Complete Left  Result Date: 11/17/2020 CLINICAL DATA:  Left heel wound for 3 months EXAM: LEFT FOOT - COMPLETE 3+ VIEW COMPARISON:  Foot radiographs 11/13/2020 FINDINGS: The bones are diffusely demineralized. Again seen is fragmentation of the calcaneus underlying a bandage presumably corresponding to the reported heel wound. There is no other acute osseous abnormality. Foot alignment is stable. No fracture or dislocation is identified. IMPRESSION: Fragmentation of the calcaneus underlying the heel wound is again concerning for  osteomyelitis. Electronically Signed   By: Valetta Mole M.D.   On: 11/17/2020 19:06    Labs on Admission: I have personally reviewed following labs  CBC: Recent Labs  Lab 11/17/20 1747  WBC 10.1  HGB 11.5*  HCT 34.9*  MCV 87.3  PLT 0000000   Basic Metabolic Panel: Recent Labs  Lab 11/17/20 1747  NA 138  K 4.3  CL 104  CO2 29  GLUCOSE 123*  BUN 33*  CREATININE 0.97  CALCIUM 9.1   GFR: Estimated Creatinine Clearance: 41.4 mL/min (by C-G formula based on SCr of 0.97 mg/dL).  Urine analysis:    Component Value Date/Time   COLORURINE STRAW (A) 08/12/2020 1234   APPEARANCEUR CLEAR (A) 08/12/2020 1234   APPEARANCEUR Clear 01/16/2014 1530   LABSPEC 1.006 08/12/2020 1234   LABSPEC 1.009 01/16/2014 1530   PHURINE 6.0 08/12/2020 1234   GLUCOSEU NEGATIVE 08/12/2020 1234   GLUCOSEU Negative 01/16/2014 1530   HGBUR NEGATIVE 08/12/2020 1234   BILIRUBINUR NEGATIVE 08/12/2020 1234   BILIRUBINUR Negative 12/16/2016 1519   BILIRUBINUR Negative 01/16/2014 1530   KETONESUR NEGATIVE 08/12/2020 1234   PROTEINUR NEGATIVE 08/12/2020 1234   UROBILINOGEN 0.2 12/16/2016 1519   NITRITE NEGATIVE 08/12/2020 1234   LEUKOCYTESUR TRACE (A) 08/12/2020 1234   LEUKOCYTESUR Negative 01/16/2014 1530   Dr. Tobie Poet [facility]  If 7PM-7AM, please contact overnight-coverage provider If 7AM-7PM, please contact day coverage provider [email or web address]  11/17/2020, 8:35 PM

## 2020-11-17 NOTE — Telephone Encounter (Signed)
Attempted to reach patient and her emergency contact who was present at visit and who resides with patient. There was no voice answering service on patients home phone , on daughter mobile phone voicemailbox is full. If patient or daughter returns call please have them speak with [facility] nurse triage, patient to be instructed to go to nearest ED today for evaluation and treatment of Left heel ulcer with underlying osteomyelitis.

## 2020-11-17 NOTE — Telephone Encounter (Signed)
-----   Message from [name], FNP sent at 11/17/2020 12:50 PM EDT ----- Call patient and let her know we have reached out to Dr Posey Pronto; however, this could be fast progressing and she could also go to ER if she couldn't seen today, 9/12. ----- Message ----- From: Buel Ream, Rad Results In Sent: 11/17/2020  10:20 AM EDT To: [name], FNP

## 2020-11-17 NOTE — ED Provider Notes (Signed)
[facility] Emergency Department Provider Note   ____________________________________________   I have reviewed the triage vital signs and the nursing notes.   HISTORY  Chief Complaint Wound Infection   History limited by: Not Limited   HPI Andrea [name] is a 75 y.o. female who presents to the emergency department today because of concern for osteomyelitis of the left heel. The patient first started developing a wound to her left heel roughly 3 months ago. Has been followed as an outpatient. Has and is currently on antibiotics for the wound. Had x-ray done last week and got the results today which were concerning for osteomyelitis. Primary care recommended ER once results came back. Patient is having associated pain to that area.  The patient however denies any systemic signs of illness, no fevers, no nausea or vomiting.   Records reviewed. Per medical record review patient has a history of x-ray performed 4 days ago concerning for osteomyelitis.   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   Depression    Diabetes mellitus type 2, uncomplicated ([place])    Diabetic retinopathy ([place])    Dupuytren contracture 2011   s/p surgery to LEFT hand   Frequent urinary tract infections    GERD (gastroesophageal reflux disease)    Grade I diastolic dysfunction    HTN (hypertension)    Hyperlipidemia, unspecified    Irritable bowel syndrome    Meningitis 11/2019   MI (myocardial infarction) ([place]) 1994   no stent   Neuromuscular disorder ([place])    nerve pain in back and legs   Osteoporosis    Pneumonia 2021   RBBB    Sepsis ([place]) 11/2019   Skin cancer    TIA (transient ischemic attack) 2014   Vitamin D deficiency,  unspecified    Wheezing     Patient Active Problem List   Diagnosis Date Noted   Cellulitis of left lower extremity 11/13/2020   Hypoglycemia 11/13/2020   Non healing left heel wound 11/13/2020   Status post total hip replacement, 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   Pain due to onychomycosis of toenail of left foot 03/06/2020   Malaise and fatigue 12/28/2019   UTI (urinary tract infection) 12/28/2019   Hyponatremia 12/27/2019   Intertrigo 12/27/2019   Acute respiratory failure with hypoxia ([place]) 11/23/2019   History of meningitis 11/23/2019   CSF leak from nose 11/22/2019   PAD (peripheral artery disease) ([place]) 12/28/2018   Moderate episode of recurrent major depressive disorder ([place]) 12/19/2017   Leg length discrepancy 09/21/2017   Coronary artery disease involving native coronary artery 01/06/2017   Drug-induced constipation 01/06/2017   Spinal stenosis of lumbar region 01/06/2017   Spondylolisthesis of lumbar region 12/30/2016   Obese 04/16/2015   History of colon polyps 11/23/2012   Bundle branch block, 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   Personal history of transient ischemic attack (TIA), and cerebral infarction without residual deficits 11/23/2012   Encounter for long-term (current) use of aspirin 11/23/2012   Status post percutaneous transluminal coronary angioplasty 11/23/2012   Transient ischemic attack (TIA), and cerebral infarction without residual deficits(V12.54) 11/23/2012   Anemia 08/17/2012   Chronic obstructive pulmonary disease ([place]) 08/17/2012   HLD (hyperlipidemia) 08/17/2012  OP (osteoporosis) 08/17/2012   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 edema, with long-term current use of insulin ([place]) 08/17/2012   Ischemic heart disease due to coronary artery obstruction ([place]) 07/24/2012    Past Surgical History:  Procedure Laterality Date   BACK SURGERY     lower due to spinal stenosis   CARDIAC CATHETERIZATION  1994   CATARACT  EXTRACTION, BILATERAL Bilateral    COLONOSCOPY     x3   COLONOSCOPY WITH PROPOFOL N/A 02/08/2019   Procedure: COLONOSCOPY WITH PROPOFOL;  Surgeon: [name], MD;  Location: [facility] ENDOSCOPY;  Service: Gastroenterology;  Laterality: N/A;   CORONARY ANGIOPLASTY  1994   DUPUYTREN CONTRACTURE RELEASE Left 2011   HARDWARE REMOVAL Left 08/19/2020   Procedure: HARDWARE REMOVAL;  Surgeon: Hessie Knows, MD;  Location: [facility] ORS;  Service: Orthopedics;  Laterality: Left;   HIP FRACTURE SURGERY  11/2013   PR COLSC FLX W/REMOVAL LESION BY HOT BX FORCEPS   08/21/2015   Procedure: COLONOSCOPY, FLEXIBLE, PROXIMAL TO SPLENIC FLEXURE; W/REMOVAL TUMOR/POLYP/OTHER LESION, HOT BX FORCEP/CAUTE; Surgeon: Carlena Hurl, MD; Location: OR [place]; Service: General Surgery   REPAIR DURAL / CSF LEAK     to left sinus   TEE WITHOUT CARDIOVERSION N/A 11/20/2019   Procedure: TRANSESOPHAGEAL ECHOCARDIOGRAM (TEE);  Surgeon: Nelva Bush, MD;  Location: [facility] ORS;  Service: Cardiovascular;  Laterality: N/A;   TOTAL HIP ARTHROPLASTY Left 08/19/2020   Procedure: TOTAL HIP ARTHROPLASTY ANTERIOR APPROACH;  Surgeon: Hessie Knows, MD;  Location: [facility] ORS;  Service: Orthopedics;  Laterality: Left;    Prior to Admission medications   Medication Sig Start Date End Date Taking? Authorizing Provider  Alcohol Swabs (ALCOHOL PREP) PADS Use twice daily prior to SQ injection of insulin to clean skin 10/02/15   Mar Daring, PA-C  atorvastatin (LIPITOR) 80 MG tablet TAKE 1 TABLET BY MOUTH EVERY DAY 10/09/20   Gollan, Kathlene November, MD  Barberry-Oreg Grape-Goldenseal (BERBERINE COMPLEX PO) Take 1 tablet by mouth 2 (two) times daily with a meal.    [provider]  Calcium Carbonate-Vitamin D 600-400 MG-UNIT tablet Take 1 tablet by mouth 2 (two) times daily.    [provider]  cephALEXin (KEFLEX) 500 MG capsule Take 1 capsule (500 mg total) by mouth 4 (four) times daily. 11/13/20   [name], FNP   collagenase (SANTYL) ointment Apply 1 application topically daily. 11/06/20   Felipa Furnace, DPM  Cyanocobalamin (B-12) 1000 MCG CAPS Take 1,000 mcg by mouth daily.     [provider]  enoxaparin (LOVENOX) 40 MG/0.4ML injection Inject 0.4 mLs (40 mg total) into the skin daily for 14 days. 08/22/20 09/05/20  Duanne Guess, PA-C  furosemide (LASIX) 20 MG tablet Take 1 tablet (20 mg total) by mouth daily. 07/03/20   Virginia Crews, MD  HYDROcodone-acetaminophen (NORCO/VICODIN) 5-325 MG tablet Take 1 tablet by mouth every 8 (eight) hours as needed for moderate pain. 11/13/20   [name], FNP  Hypertonic Nasal Wash (SINUS RINSE NA) Place 1 application into the nose at bedtime. Patient not taking: Reported on 11/17/2020    [provider]  Insulin Pen Needle (TRUEPLUS PEN NEEDLES) 31G X 6 MM MISC Use with lantus two times daily. 08/05/20   Virginia Crews, MD  INSULIN SYRINGE 1CC/29G 29G X 1/2" 1 ML MISC For lantus injections twice daily 10/15/19   Mar Daring, PA-C  LANTUS SOLOSTAR 100 UNIT/ML Solostar Pen Inject 40 Units  into the skin at bedtime. 11/13/20   [name], FNP  losartan (COZAAR) 25 MG tablet TAKE 1 TABLET BY MOUTH EVERY DAY 10/09/20   Bacigalupo, Dionne Bucy, MD  metoprolol succinate (TOPROL-XL) 25 MG 24 hr tablet TAKE 1 TABLET BY MOUTH EVERY DAY 05/29/20   Mar Daring, PA-C  Multiple Vitamins-Minerals (PRESERVISION AREDS 2 PO) Take 1 tablet by mouth in the morning and at bedtime.    [provider]  Omega-3 1000 MG CAPS Take 1,000 mg by mouth daily.    [provider]  omeprazole (PRILOSEC) 40 MG capsule TAKE 1 CAPSULE BY MOUTH EVERY DAY 10/19/20   Bacigalupo, Dionne Bucy, MD  OneTouch Delica Lancets 99991111 MISC To check blood sugar daily  DX: E11.9 07/31/20   Bacigalupo, Dionne Bucy, MD  ONETOUCH VERIO test strip TO CHECK BLOOD SUGAR ONCE DAILY. 10/25/19   Mar Daring, PA-C  oxybutynin (DITROPAN-XL) 10 MG 24 hr tablet TAKE 1 TABLET  BY MOUTH EVERYDAY AT BEDTIME 10/07/20   Bacigalupo, Dionne Bucy, MD  potassium chloride (KLOR-CON) 10 MEQ tablet Take 1 tablet (10 mEq total) by mouth daily. 06/05/20   Mar Daring, PA-C  TRULICITY 1.5 0000000 SOPN INJECT 1.'5MG'$  INTO THE SKIN ONCE A WEEK AS DIRECTED 07/21/20   Bacigalupo, Dionne Bucy, MD  Vitamin D, Ergocalciferol, (DRISDOL) 1.25 MG (50000 UNIT) CAPS capsule TAKE ONE CAPSULE BY MOUTH ONCE WEEKLY ON THE SAME DAY EACH WEEK (ON FRIDAYS) 06/30/20   Jerrol Banana., MD    Allergies Metformin and related and Sulfa antibiotics  Family History  Problem Relation Age of Onset   Depression Daughter    Arthritis Daughter        spine   Plantar fasciitis Daughter    Anxiety disorder Daughter    Hyperlipidemia Daughter    AAA (abdominal aortic aneurysm) Daughter    Hypertension Mother    Stroke Mother    Cataracts Mother    Depression Mother    Heart disease Mother    Asthma Brother    Diabetes Daughter    Hyperlipidemia Daughter    Hypertension Daughter    Breast cancer Cousin        maternal    Social History Social History   Tobacco Use   Smoking status: Former    Packs/day: 1.00    Years: 30.00    Pack years: 30.00    Types: Cigarettes    Quit date: 07/17/2012    Years since quitting: 8.3   Smokeless tobacco: Never   Tobacco comments:       Vaping Use   Vaping Use: Never used  Substance Use Topics   Alcohol use: Not Currently    Alcohol/week: 0.0 standard drinks   Drug use: No    Review of Systems Constitutional: No fever/chills Eyes: No visual changes. ENT: No sore throat. Cardiovascular: Denies chest pain. Respiratory: Denies shortness of breath. Gastrointestinal: No abdominal pain.  No nausea, no vomiting.  No diarrhea.   Genitourinary: Negative for dysuria. Musculoskeletal: Positive for left heel wound and pain. Skin: Negative for rash. Neurological: Negative for headaches, focal weakness or  numbness.  ____________________________________________   PHYSICAL EXAM:  VITAL SIGNS: ED Triage Vitals  Enc Vitals Group     BP 11/17/20 1745 135/71     Pulse Rate 11/17/20 1745 (!) 102     Resp 11/17/20 1745 20     Temp 11/17/20 1745 98 F (36.7 C)     Temp Source 11/17/20 1745 Oral  SpO2 11/17/20 1745 99 %     Weight 11/17/20 1744 138 lb (62.6 kg)     Height 11/17/20 1744 5' (1.524 m)     Head Circumference --      Peak Flow --      Pain Score 11/17/20 1744 7   Constitutional: Alert and oriented.  Eyes: Conjunctivae are normal.  ENT      Head: Normocephalic and atraumatic.      Nose: No congestion/rhinnorhea.      Mouth/Throat: Mucous membranes are moist.      Neck: No stridor. Hematological/Lymphatic/Immunilogical: No cervical lymphadenopathy. Cardiovascular: Normal rate, regular rhythm.  No murmurs, rubs, or gallops.  Respiratory: Normal respiratory effort without tachypnea nor retractions. Breath sounds are clear and equal bilaterally. No wheezes/rales/rhonchi. Gastrointestinal: Soft and non tender. No rebound. No guarding.  Genitourinary: Deferred Musculoskeletal: Normal range of motion in all extremities. No lower extremity edema. Neurologic:  Normal speech and language. No gross focal neurologic deficits are appreciated.  Skin:  Wound noted to left heel. Psychiatric: Mood and affect are normal. Speech and behavior are normal. Patient exhibits appropriate insight and judgment.  ____________________________________________    LABS (pertinent positives/negatives)  CBC wbc 10.1, hgb 11.5, plt 233 BMP wnl except glu 123, BUN 33  ____________________________________________   EKG  None  ____________________________________________    RADIOLOGY  Left foot Fragmentation of the calcaneous concerning for  osteomyelitis.  ____________________________________________   PROCEDURES  Procedures  ____________________________________________   INITIAL IMPRESSION / ASSESSMENT AND PLAN / ED COURSE  Pertinent labs & imaging results that were available during my care of the patient were reviewed by me and considered in my medical decision making (see chart for details).   Patient presented to the emergency department today because of concerns for osteomyelitis of the left heel that was found on outpatient x-ray last week.  X-ray today again is concerning for osteomyelitis.  Discussed this with patient and family.  Will start IV antibiotics and plan on admission. ____________________________________________   FINAL CLINICAL IMPRESSION(S) / ED DIAGNOSES  Final diagnoses:  Osteomyelitis, unspecified site, unspecified type [facility])     Note: This dictation was prepared with Dragon dictation. Any transcriptional errors that result from this process are unintentional     Nance Pear, MD 11/17/20 2024

## 2020-11-18 ENCOUNTER — Other Ambulatory Visit: Payer: Medicare Other

## 2020-11-18 ENCOUNTER — Observation Stay: Payer: Medicare Other

## 2020-11-18 DIAGNOSIS — E785 Hyperlipidemia, unspecified: Secondary | ICD-10-CM | POA: Diagnosis not present

## 2020-11-18 DIAGNOSIS — L97529 Non-pressure chronic ulcer of other part of left foot with unspecified severity: Secondary | ICD-10-CM | POA: Diagnosis not present

## 2020-11-18 DIAGNOSIS — J42 Unspecified chronic bronchitis: Secondary | ICD-10-CM | POA: Diagnosis not present

## 2020-11-18 DIAGNOSIS — I739 Peripheral vascular disease, unspecified: Secondary | ICD-10-CM | POA: Diagnosis not present

## 2020-11-18 DIAGNOSIS — M86272 Subacute osteomyelitis, left ankle and foot: Secondary | ICD-10-CM | POA: Diagnosis not present

## 2020-11-18 DIAGNOSIS — Z95828 Presence of other vascular implants and grafts: Secondary | ICD-10-CM | POA: Diagnosis not present

## 2020-11-18 DIAGNOSIS — I700 Atherosclerosis of aorta: Secondary | ICD-10-CM | POA: Diagnosis not present

## 2020-11-18 DIAGNOSIS — M86179 Other acute osteomyelitis, unspecified ankle and foot: Secondary | ICD-10-CM | POA: Diagnosis not present

## 2020-11-18 DIAGNOSIS — E11621 Type 2 diabetes mellitus with foot ulcer: Secondary | ICD-10-CM | POA: Diagnosis present

## 2020-11-18 DIAGNOSIS — M19072 Primary osteoarthritis, left ankle and foot: Secondary | ICD-10-CM | POA: Diagnosis not present

## 2020-11-18 DIAGNOSIS — K219 Gastro-esophageal reflux disease without esophagitis: Secondary | ICD-10-CM | POA: Diagnosis not present

## 2020-11-18 DIAGNOSIS — Z23 Encounter for immunization: Secondary | ICD-10-CM | POA: Diagnosis not present

## 2020-11-18 DIAGNOSIS — M86172 Other acute osteomyelitis, left ankle and foot: Secondary | ICD-10-CM | POA: Diagnosis not present

## 2020-11-18 DIAGNOSIS — M85872 Other specified disorders of bone density and structure, left ankle and foot: Secondary | ICD-10-CM | POA: Diagnosis not present

## 2020-11-18 DIAGNOSIS — I110 Hypertensive heart disease with heart failure: Secondary | ICD-10-CM | POA: Diagnosis present

## 2020-11-18 DIAGNOSIS — M7989 Other specified soft tissue disorders: Secondary | ICD-10-CM | POA: Diagnosis not present

## 2020-11-18 DIAGNOSIS — I10 Essential (primary) hypertension: Secondary | ICD-10-CM | POA: Diagnosis not present

## 2020-11-18 DIAGNOSIS — J449 Chronic obstructive pulmonary disease, unspecified: Secondary | ICD-10-CM | POA: Diagnosis present

## 2020-11-18 DIAGNOSIS — E119 Type 2 diabetes mellitus without complications: Secondary | ICD-10-CM | POA: Diagnosis not present

## 2020-11-18 DIAGNOSIS — L97424 Non-pressure chronic ulcer of left heel and midfoot with necrosis of bone: Secondary | ICD-10-CM | POA: Diagnosis not present

## 2020-11-18 DIAGNOSIS — M6281 Muscle weakness (generalized): Secondary | ICD-10-CM | POA: Diagnosis not present

## 2020-11-18 DIAGNOSIS — L97423 Non-pressure chronic ulcer of left heel and midfoot with necrosis of muscle: Secondary | ICD-10-CM | POA: Diagnosis not present

## 2020-11-18 DIAGNOSIS — M86472 Chronic osteomyelitis with draining sinus, left ankle and foot: Secondary | ICD-10-CM | POA: Diagnosis not present

## 2020-11-18 DIAGNOSIS — E1151 Type 2 diabetes mellitus with diabetic peripheral angiopathy without gangrene: Secondary | ICD-10-CM | POA: Diagnosis present

## 2020-11-18 DIAGNOSIS — E569 Vitamin deficiency, unspecified: Secondary | ICD-10-CM | POA: Diagnosis not present

## 2020-11-18 DIAGNOSIS — I2510 Atherosclerotic heart disease of native coronary artery without angina pectoris: Secondary | ICD-10-CM | POA: Diagnosis not present

## 2020-11-18 DIAGNOSIS — E7849 Other hyperlipidemia: Secondary | ICD-10-CM | POA: Diagnosis not present

## 2020-11-18 DIAGNOSIS — Z9889 Other specified postprocedural states: Secondary | ICD-10-CM | POA: Diagnosis not present

## 2020-11-18 DIAGNOSIS — E1169 Type 2 diabetes mellitus with other specified complication: Secondary | ICD-10-CM | POA: Diagnosis present

## 2020-11-18 DIAGNOSIS — I5022 Chronic systolic (congestive) heart failure: Secondary | ICD-10-CM | POA: Diagnosis present

## 2020-11-18 DIAGNOSIS — I252 Old myocardial infarction: Secondary | ICD-10-CM | POA: Diagnosis not present

## 2020-11-18 DIAGNOSIS — N179 Acute kidney failure, unspecified: Secondary | ICD-10-CM | POA: Diagnosis present

## 2020-11-18 DIAGNOSIS — F419 Anxiety disorder, unspecified: Secondary | ICD-10-CM | POA: Diagnosis present

## 2020-11-18 DIAGNOSIS — R262 Difficulty in walking, not elsewhere classified: Secondary | ICD-10-CM | POA: Diagnosis not present

## 2020-11-18 DIAGNOSIS — M47814 Spondylosis without myelopathy or radiculopathy, thoracic region: Secondary | ICD-10-CM | POA: Diagnosis not present

## 2020-11-18 DIAGNOSIS — A499 Bacterial infection, unspecified: Secondary | ICD-10-CM | POA: Diagnosis not present

## 2020-11-18 DIAGNOSIS — M7662 Achilles tendinitis, left leg: Secondary | ICD-10-CM | POA: Diagnosis not present

## 2020-11-18 DIAGNOSIS — L97429 Non-pressure chronic ulcer of left heel and midfoot with unspecified severity: Secondary | ICD-10-CM | POA: Diagnosis not present

## 2020-11-18 DIAGNOSIS — M869 Osteomyelitis, unspecified: Secondary | ICD-10-CM | POA: Diagnosis not present

## 2020-11-18 DIAGNOSIS — E7800 Pure hypercholesterolemia, unspecified: Secondary | ICD-10-CM | POA: Diagnosis not present

## 2020-11-18 DIAGNOSIS — D638 Anemia in other chronic diseases classified elsewhere: Secondary | ICD-10-CM | POA: Diagnosis present

## 2020-11-18 DIAGNOSIS — L03116 Cellulitis of left lower limb: Secondary | ICD-10-CM | POA: Diagnosis not present

## 2020-11-18 DIAGNOSIS — E669 Obesity, unspecified: Secondary | ICD-10-CM | POA: Diagnosis present

## 2020-11-18 DIAGNOSIS — E876 Hypokalemia: Secondary | ICD-10-CM | POA: Diagnosis not present

## 2020-11-18 DIAGNOSIS — F32A Depression, unspecified: Secondary | ICD-10-CM | POA: Diagnosis present

## 2020-11-18 DIAGNOSIS — B965 Pseudomonas (aeruginosa) (mallei) (pseudomallei) as the cause of diseases classified elsewhere: Secondary | ICD-10-CM | POA: Diagnosis present

## 2020-11-18 DIAGNOSIS — Z741 Need for assistance with personal care: Secondary | ICD-10-CM | POA: Diagnosis not present

## 2020-11-18 DIAGNOSIS — N3281 Overactive bladder: Secondary | ICD-10-CM | POA: Diagnosis not present

## 2020-11-18 DIAGNOSIS — M86672 Other chronic osteomyelitis, left ankle and foot: Secondary | ICD-10-CM | POA: Diagnosis not present

## 2020-11-18 DIAGNOSIS — Z20822 Contact with and (suspected) exposure to covid-19: Secondary | ICD-10-CM | POA: Diagnosis present

## 2020-11-18 DIAGNOSIS — L97409 Non-pressure chronic ulcer of unspecified heel and midfoot with unspecified severity: Secondary | ICD-10-CM | POA: Diagnosis not present

## 2020-11-18 DIAGNOSIS — Z8661 Personal history of infections of the central nervous system: Secondary | ICD-10-CM | POA: Diagnosis not present

## 2020-11-18 DIAGNOSIS — M810 Age-related osteoporosis without current pathological fracture: Secondary | ICD-10-CM | POA: Diagnosis present

## 2020-11-18 DIAGNOSIS — M868X7 Other osteomyelitis, ankle and foot: Secondary | ICD-10-CM | POA: Diagnosis present

## 2020-11-18 DIAGNOSIS — Z452 Encounter for adjustment and management of vascular access device: Secondary | ICD-10-CM | POA: Diagnosis not present

## 2020-11-18 DIAGNOSIS — E11319 Type 2 diabetes mellitus with unspecified diabetic retinopathy without macular edema: Secondary | ICD-10-CM | POA: Diagnosis present

## 2020-11-18 LAB — HEMOGLOBIN A1C
Hgb A1c MFr Bld: 7.5 % — ABNORMAL HIGH (ref 4.8–5.6)
Mean Plasma Glucose: 168.55 mg/dL

## 2020-11-18 LAB — BASIC METABOLIC PANEL
Anion gap: 6 (ref 5–15)
BUN: 34 mg/dL — ABNORMAL HIGH (ref 8–23)
CO2: 25 mmol/L (ref 22–32)
Calcium: 8.5 mg/dL — ABNORMAL LOW (ref 8.9–10.3)
Chloride: 107 mmol/L (ref 98–111)
Creatinine, Ser: 0.77 mg/dL (ref 0.44–1.00)
GFR, Estimated: >60 mL/min (ref >60)
Glucose, Bld: 131 mg/dL — ABNORMAL HIGH (ref 70–99)
Potassium: 4.3 mmol/L (ref 3.5–5.1)
Sodium: 138 mmol/L (ref 135–145)

## 2020-11-18 LAB — RESP PANEL BY RT-PCR (FLU A&B, COVID) ARPGX2
Influenza A by PCR: NEGATIVE
Influenza B by PCR: NEGATIVE
SARS Coronavirus 2 by RT PCR: NEGATIVE

## 2020-11-18 LAB — CBC
HCT: 30.4 % — ABNORMAL LOW (ref 36.0–46.0)
Hemoglobin: 9.8 g/dL — ABNORMAL LOW (ref 12.0–15.0)
MCH: 28.4 pg (ref 26.0–34.0)
MCHC: 32.2 g/dL (ref 30.0–36.0)
MCV: 88.1 fL (ref 80.0–100.0)
Platelets: 200 10*3/uL (ref 150–400)
RBC: 3.45 MIL/uL — ABNORMAL LOW (ref 3.87–5.11)
RDW: 16 % — ABNORMAL HIGH (ref 11.5–15.5)
WBC: 7.6 10*3/uL (ref 4.0–10.5)
nRBC: 0 % (ref 0.0–0.2)

## 2020-11-18 LAB — GLUCOSE, CAPILLARY
Glucose-Capillary: 96 mg/dL (ref 70–99)
Glucose-Capillary: 164 mg/dL — ABNORMAL HIGH (ref 70–99)
Glucose-Capillary: 121 mg/dL — ABNORMAL HIGH (ref 70–99)
Glucose-Capillary: 183 mg/dL — ABNORMAL HIGH (ref 70–99)

## 2020-11-18 LAB — C-REACTIVE PROTEIN: CRP: 0.7 mg/dL (ref <1.0)

## 2020-11-18 MED ORDER — SODIUM CHLORIDE 0.9 % IV SOLN
INTRAVENOUS | Status: DC | PRN
Start: 2020-11-18 — End: 2020-12-01
  Administered 2020-11-18: 250 mL via INTRAVENOUS
  Administered 2020-11-21: 1000 mL via INTRAVENOUS
  Administered 2020-11-28 – 2020-11-29 (×2): 250 mL via INTRAVENOUS

## 2020-11-18 MED ORDER — VANCOMYCIN HCL 750 MG/150ML IV SOLN
750.0000 mg | INTRAVENOUS | Status: DC
  Administered 2020-11-18: 750 mg via INTRAVENOUS
  Filled 2020-11-18 (×2): qty 150

## 2020-11-18 MED ORDER — OXYCODONE HCL 5 MG PO TABS
5.0000 mg | ORAL_TABLET | ORAL | Status: DC | PRN
  Administered 2020-11-18 – 2020-12-01 (×53): 5 mg via ORAL
  Filled 2020-11-18 (×56): qty 1

## 2020-11-18 MED ORDER — KETOROLAC TROMETHAMINE 15 MG/ML IJ SOLN
15.0000 mg | Freq: Four times a day (QID) | INTRAMUSCULAR | Status: DC
Start: 2020-11-18 — End: 2020-11-20
  Administered 2020-11-18 – 2020-11-20 (×8): 15 mg via INTRAVENOUS
  Filled 2020-11-18 (×8): qty 1

## 2020-11-18 MED ORDER — GABAPENTIN 300 MG PO CAPS: 300.0000 mg | ORAL_CAPSULE | Freq: Two times a day (BID) | ORAL | Status: DC

## 2020-11-18 MED ORDER — MORPHINE SULFATE (PF) 2 MG/ML IV SOLN
2.0000 mg | INTRAVENOUS | Status: DC | PRN
  Administered 2020-11-20 – 2020-11-21 (×4): 2 mg via INTRAVENOUS
  Filled 2020-11-18 (×4): qty 1

## 2020-11-18 MED ORDER — ACETAMINOPHEN 500 MG PO TABS
1000.0000 mg | ORAL_TABLET | Freq: Three times a day (TID) | ORAL | Status: DC
  Administered 2020-11-18 – 2020-12-01 (×37): 1000 mg via ORAL
  Filled 2020-11-18 (×37): qty 2

## 2020-11-18 MED ORDER — ENOXAPARIN SODIUM 40 MG/0.4ML IJ SOSY
40.0000 mg | PREFILLED_SYRINGE | INTRAMUSCULAR | Status: DC
  Administered 2020-11-18 – 2020-11-19 (×2): 40 mg via SUBCUTANEOUS
  Filled 2020-11-18 (×2): qty 0.4

## 2020-11-18 NOTE — Progress Notes (Signed)
Inpatient Diabetes Program Recommendations  AACE/ADA: New Consensus Statement on Inpatient Glycemic Control (2015)  Target Ranges:  Prepandial:   less than 140 mg/dL      Peak postprandial:   less than 180 mg/dL (1-2 hours)      Critically ill patients:  140 - 180 mg/dL   Lab Results  Component Value Date   GLUCAP 164 (H) 11/18/2020   HGBA1C 7.5 (H) 11/17/2020    Review of Glycemic Control Results for Andrea, [name] (MRN [id number]) as of 11/18/2020 13:36  Ref. Range 11/17/2020 22:37 11/18/2020 07:42 11/18/2020 12:38  Glucose-Capillary Latest Ref Range: 70 - 99 mg/dL 235 (H) 96 164 (H)   Diabetes history: DM 2 Outpatient Diabetes medications: Lantus 40 units, Trulicity 1.5 mg weekly Current orders for Inpatient glycemic control:  Semglee 40 units qhs Novolog 0-15 units tid + hs  Consult insulin management A1c 7.5% on 9/12  Inpatient Diabetes Program Recommendations:    Agree with current orders. Will follow.   Thanks,  Tama Headings RN, MSN, BC-ADM Inpatient Diabetes Coordinator Team Pager [phone] (8a-5p)

## 2020-11-18 NOTE — Consult Note (Signed)
Pharmacy Antibiotic Note  Andrea [name] is a 75 y.o. female admitted on 11/17/2020 with  osteomyelitis .  Pharmacy has been consulted for cefepime and Vancomycin dosing.  Plan: Cefepime 2 gram Q12H Vancomycin 1500 mg LD x 1 followed by Vancomycin 750 mg Q24H. Goal AUC 400-550 Estimated AUC: 485/Cmin 13.5 Scr: 0.97, IBW, Vd 0.72   Height: 5' (152.4 cm) Weight: 62.6 kg (138 lb) IBW/kg (Calculated) : 45.5  Temp (24hrs), Avg:98.1 F (36.7 C), Min:98 F (36.7 C), Max:98.2 F (36.8 C)  Recent Labs  Lab 11/17/20 1747  WBC 10.1  CREATININE 0.97    Estimated Creatinine Clearance: 41.4 mL/min (by C-G formula based on SCr of 0.97 mg/dL).    Allergies  Allergen Reactions   Metformin And Related Diarrhea   Sulfa Antibiotics Rash    Antimicrobials this admission: 9/13 cefepime>>  9/13 vancomycin >>   Dose adjustments this admission: N/a  Microbiology results: 9/13 BCx: sent 9/13 UCx: sent  9/13 MRSA PCR: sent  Thank you for allowing pharmacy to be a part of this patient's care.  Dorothe Pea, PharmD, BCPS Clinical Pharmacist   11/18/2020 12:28 AM

## 2020-11-18 NOTE — H&P (View-Only) (Signed)
PODIATRY CONSULTATION  NAME Andrea [name] MRN [id number] DOB 03-14-1945 DOA 11/17/2020   Reason for consult: Ulcer / OM calc LT Chief Complaint  Patient presents with   Wound Infection    Consulting physician: Dr. Ezequiel Essex  History of present illness: 75 y.o. female admitted through the ED for concern of worsening ulcer with osteomyelitis LT posterior heel.  Patient resting with her 2 daughters, [name] and Caryl Asp, in the room during evaluation.  History of the wound began when the patient had left hip surgery on 08/19/2020.  Apparently a cam boot was applied to the LLE intraoperatively to allow for distraction or to aid in the surgery in some form or fashion.  At that time a small wound developed which has progressively deteriorated over the past 3 months.  Was seen by podiatry outpatient on 10/21/2020 and 11/06/2020.  Since then the wound has deteriorated and progressed and her PCP recommended she presents to the emergency department.  MRI performed today  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   Depression    Diabetes mellitus type 2, uncomplicated ([place])    Diabetic retinopathy ([place])    Dupuytren contracture 2011   s/p surgery to LEFT hand   Frequent urinary tract infections    GERD (gastroesophageal reflux disease)    Grade I diastolic dysfunction    HTN (hypertension)    Hyperlipidemia, unspecified    Irritable bowel syndrome    Meningitis 11/2019   MI (myocardial infarction) ([place]) 1994   no stent   Neuromuscular disorder (HCC)    nerve pain in back and legs   Osteoporosis    Pneumonia 2021   RBBB    Sepsis ([place]) 11/2019   Skin cancer    TIA (transient ischemic attack) 2014   Vitamin D deficiency, unspecified    Wheezing      CBC Latest Ref Rng & Units 11/18/2020 11/17/2020 08/23/2020  WBC 4.0 - 10.5 K/uL 7.6 10.1 9.8  Hemoglobin 12.0 - 15.0 g/dL 9.8(L) 11.5(L) 8.2(L)  Hematocrit 36.0 - 46.0 % 30.4(L) 34.9(L) 24.9(L)  Platelets 150 - 400 K/uL 200 233 110(L)    BMP Latest Ref Rng & Units 11/18/2020 11/17/2020 08/26/2020  Glucose 70 - 99 mg/dL 131(H) 123(H) -  BUN 8 - 23 mg/dL 34(H) 33(H) -  Creatinine 0.44 - 1.00 mg/dL 0.77 0.97 1.31(H)  BUN/Creat Ratio 12 - 28 - - -  Sodium 135 - 145 mmol/L 138 138 -  Potassium 3.5 - 5.1 mmol/L 4.3 4.3 -  Chloride 98 - 111 mmol/L 107 104 -  CO2 22 - 32 mmol/L 25 29 -  Calcium 8.9 - 10.3 mg/dL 8.5(L) 9.1 -       Physical Exam: General: The patient is alert and oriented x3 in no acute distress.   Dermatology: Open wound right posterior Achilles with exposed tendon.  Please see attached photo under media  Vascular: VAS [place] ABI w/wo TBI 10/31/2020: Bilateral ABIs and TBIs appear essentially unchanged compared to prior study on 04/11/2020.     Summary:  Right: Resting right ankle-brachial index is within normal range. No  evidence of significant right lower extremity arterial disease. The right toe-brachial index is normal.  Left: Resting left ankle-brachial index is within normal range. No  evidence of significant left lower extremity arterial disease. The left  toe-brachial index is normal.   Neurological: Epicritic and protective threshold diminished bilaterally.   Musculoskeletal Exam: No structural deformity noted.  Associated tenderness around the ulcer site  MR Heel LT wo contrast 11/18/2020: FINDINGS: Bones/Joint/Cartilage There is bony edema and confluent low T1 signal within the posterior calcaneus. There is long segment intramedullary T2 hyperintense/T1 hypointense nonaggressive lesion within the partially visualized tibial diaphysis and metaphysis, could be developing bone infarct or marrow conversion changes.  IMPRESSION: Posterior heel soft tissue  ulcer with underlying osteomyelitis of the posterior calcaneus. Distal Achilles tendinosis and peritendinitis with ulcer-related disruption/erosion through the tendon at its insertion on the calcaneus.    ASSESSMENT/PLAN OF CARE Ulcer Achilles left.  Osteomyelitis calcaneus left -MRI, x-rays, vascular studies were all reviewed with the patient and daughters today. -Long detailed conversation with the patient and her daughters regarding the suspected osteomyelitis of the calcaneus which is not ideal and all possible outcomes which may even include below-knee amputation.  Regardless of the treatment, I did explain that this will be a long road to recovery.  In an attempt to salvage the foot, the osteomyelitis of the calcaneus would require posterior tubercle partial calcanectomy with placement of antibiotic beads and long-term outpatient wound care. -Prior to such a radical procedure, I recommended to the family simple debridement of the ulcer and bone biopsy to definitively diagnose the osteomyelitis.  The patient and family are in agreement. -Orders placed for debridement of posterior heel ulcer with bone biopsy left.  Surgery tentatively scheduled for tomorrow afternoon or evening.  N.p.o. after midnight -Continue IV antibiotics as per admitting physician.  Would recommend infectious disease consult since the patient will likely need long-term IV antibiotics post discharge depending on bone biopsy results -Will continue to follow      Thank you for the consult.  Please contact me directly with any questions or concerns.  Cell [id number]   Edrick Kins, DPM [facility]  Dr. Edrick Kins, DPM    [address], [place] [address]                Office [phone]  Fax [phone]

## 2020-11-18 NOTE — Progress Notes (Addendum)
[name] PROGRESS NOTE    ORIAH HOLLENBACH  [id number] DOB: 05-Jan-1946 DOA: 11/17/2020 PCP: [name], FNP   Brief Narrative:  75 y.o. female with medical history significant for insulin-dependent diabetes mellitus, hypertension, hyperlipidemia, GERD, history of meningitis, CAD, COPD, history of TIA, grade 1 diastolic dysfunction, depression, anxiety, osteoarthritis, who presents to the emergency department from home at the request of her PCP for chief concerns of osteomyelitis of the left heel.   She states that the wound has been there for 3 months and she has taken antibiotics in it and has not resolved.  She states that this started after her orthopedic boot was taken off and she thinks that there might have been a scratch.  She states that the wound has progressively worsened prompting her to present to her PCP.  Her PCP ordered a foot x-ray on 11/13/2020 and resulted today.  She endorses inability to bear weight due to the pain.   Assessment & Plan:   Principal Problem:   Osteomyelitis ([place]) Active Problems:   Chronic obstructive pulmonary disease (HCC)   HLD (hyperlipidemia)   Coronary artery disease involving native coronary artery   PAD (peripheral artery disease) (HCC)   History of meningitis   Status post total hip replacement, left   Cellulitis of left lower extremity  Left heel osteomyelitis I suspect this is secondary to pressure ulcer in setting of recent orthopedic surgery Does not meet sepsis criteria Podiatry made aware Plan: Continue vancomycin and cefepime As needed pain control Okay for diet today Follow cultures Podiatry follow-up   Insulin-dependent diabetes mellitus Lantus 40 units nightly resumed carb modified diet Consult diabetes coordinator   Hypertension PTA losartan 25 mg daily PTA Toprol-XL 25 mg daily  Hyperlipidemia PTA Lipitor 80 mg daily  Overactive bladder PTA oxybutynin 20 mg nightly  GERD PPI   History of meningitis and CSF  leak No acute issues  DVT prophylaxis: SQ Lovenox Code Status: Full Family Communication: Left VM for daughter [name] [id number] on 9/13 Disposition Plan:Status is: Observation  The patient will require care spanning > 2 midnights and should be moved to inpatient because: Inpatient level of care appropriate due to severity of illness  Dispo: The patient is from: Home              Anticipated d/c is to: SNF              Patient currently is not medically stable to d/c.   Difficult to place patient No       Level of care: Med-Surg  Consultants:  Podiatry-Dr. Amalia Hailey  Procedures:  None  Antimicrobials:  Vancomycin Cefepime   Subjective: Seen and examined.  Only complaint is pain in left heel.  Upset and concerned about possibility of surgical intervention  Objective: Vitals:   11/17/20 2131 11/17/20 2216 11/18/20 0420 11/18/20 0906  BP: (!) 142/60 (!) 148/82 (!) 115/52 106/70  Pulse: 92 (!) 104 96 (!) 101  Resp: '18 20 18 16  '$ Temp: 98.1 F (36.7 C) 98.2 F (36.8 C) 97.9 F (36.6 C) 98.3 F (36.8 C)  TempSrc: Oral  Oral   SpO2: 98% 99% 99% 98%  Weight:      Height:        Intake/Output Summary (Last 24 hours) at 11/18/2020 1059 Last data filed at 11/18/2020 0544 Gross per 24 hour  Intake 425.36 ml  Output --  Net 425.36 ml   Filed Weights   11/17/20 1744  Weight: 62.6 kg  Examination:  General exam: Appears calm and comfortable  Respiratory system: Lungs clear.  Normal work of breathing.  Room air Cardiovascular system: S1-S2, RRR, no murmurs, no pedal edema Gastrointestinal system: Abdomen is nondistended, soft and nontender. No organomegaly or masses felt. Normal bowel sounds heard. Central nervous system: Alert and oriented. No focal neurological deficits. Extremities: Left heel full-thickness ulcer with surrounding edema and serosanguineous drainage Skin: No rashes, lesions or ulcers Psychiatry: Judgement and insight appear normal. Mood &  affect appropriate.     Data Reviewed: I have personally reviewed following labs and imaging studies  CBC: Recent Labs  Lab 11/17/20 1747 11/18/20 0503  WBC 10.1 7.6  HGB 11.5* 9.8*  HCT 34.9* 30.4*  MCV 87.3 88.1  PLT 233 [id number]   Basic Metabolic Panel: Recent Labs  Lab 11/17/20 1747 11/18/20 0503  NA 138 138  K 4.3 4.3  CL 104 107  CO2 29 25  GLUCOSE 123* 131*  BUN 33* 34*  CREATININE 0.97 0.77  CALCIUM 9.1 8.5*   GFR: Estimated Creatinine Clearance: 50.2 mL/min (by C-G formula based on SCr of 0.77 mg/dL). Liver Function Tests: No results for input(s): AST, ALT, ALKPHOS, BILITOT, PROT, ALBUMIN in the last 168 hours. No results for input(s): LIPASE, AMYLASE in the last 168 hours. No results for input(s): AMMONIA in the last 168 hours. Coagulation Profile: No results for input(s): INR, PROTIME in the last 168 hours. Cardiac Enzymes: No results for input(s): CKTOTAL, CKMB, CKMBINDEX, TROPONINI in the last 168 hours. BNP (last 3 results) No results for input(s): PROBNP in the last 8760 hours. HbA1C: Recent Labs    11/17/20 2234  HGBA1C 7.5*   CBG: Recent Labs  Lab 11/17/20 2237 11/18/20 0742  GLUCAP 235* 96   Lipid Profile: No results for input(s): CHOL, HDL, LDLCALC, TRIG, CHOLHDL, LDLDIRECT in the last 72 hours. Thyroid Function Tests: No results for input(s): TSH, T4TOTAL, FREET4, T3FREE, THYROIDAB in the last 72 hours. Anemia Panel: No results for input(s): VITAMINB12, FOLATE, FERRITIN, TIBC, IRON, RETICCTPCT in the last 72 hours. Sepsis Labs: No results for input(s): PROCALCITON, LATICACIDVEN in the last 168 hours.  Recent Results (from the past 240 hour(s))  Resp Panel by RT-PCR (Flu A&B, Covid) Nasopharyngeal Swab     Status: None   Collection Time: 11/17/20  7:45 AM   Specimen: Nasopharyngeal Swab; Nasopharyngeal(NP) swabs in vial transport medium  Result Value Ref Range Status   SARS Coronavirus 2 by RT PCR NEGATIVE NEGATIVE Final    Comment:  (NOTE) SARS-CoV-2 target nucleic acids are NOT DETECTED.  The SARS-CoV-2 RNA is generally detectable in upper respiratory specimens during the acute phase of infection. The lowest concentration of SARS-CoV-2 viral copies this assay can detect is 138 copies/mL. A negative result does not preclude SARS-Cov-2 infection and should not be used as the sole basis for treatment or other patient management decisions. A negative result may occur with  improper specimen collection/handling, submission of specimen other than nasopharyngeal swab, presence of viral mutation(s) within the areas targeted by this assay, and inadequate number of viral copies(<138 copies/mL). A negative result must be combined with clinical observations, patient history, and epidemiological information. The expected result is Negative.  Fact Sheet for Patients:  EntrepreneurPulse.com.au  Fact Sheet for Healthcare Providers:  IncredibleEmployment.be  This test is no t yet approved or cleared by the [place] FDA and  has been authorized for detection and/or diagnosis of SARS-CoV-2 by FDA under an Emergency Use Authorization (EUA). This EUA will remain  in effect (meaning this test can be used) for the duration of the COVID-19 declaration under Section 564(b)(1) of the Act, 21 U.S.C.section 360bbb-3(b)(1), unless the authorization is terminated  or revoked sooner.       Influenza A by PCR NEGATIVE NEGATIVE Final   Influenza B by PCR NEGATIVE NEGATIVE Final    Comment: (NOTE) The Xpert Xpress SARS-CoV-2/FLU/RSV plus assay is intended as an aid in the diagnosis of influenza from Nasopharyngeal swab specimens and should not be used as a sole basis for treatment. Nasal washings and aspirates are unacceptable for Xpert Xpress SARS-CoV-2/FLU/RSV testing.  Fact Sheet for Patients: EntrepreneurPulse.com.au  Fact Sheet for Healthcare  Providers: IncredibleEmployment.be  This test is not yet approved or cleared by the [place] FDA and has been authorized for detection and/or diagnosis of SARS-CoV-2 by FDA under an Emergency Use Authorization (EUA). This EUA will remain in effect (meaning this test can be used) for the duration of the COVID-19 declaration under Section 564(b)(1) of the Act, 21 U.S.C. section 360bbb-3(b)(1), unless the authorization is terminated or revoked.  Performed at [facility], [address] [place], [place] [address]   CULTURE, BLOOD (ROUTINE X 2) w Reflex to ID Panel     Status: None (Preliminary result)   Collection Time: 11/17/20 10:34 PM   Specimen: BLOOD  Result Value Ref Range Status   Specimen Description BLOOD LEFT FOREARM  Final   Special Requests   Final    BOTTLES DRAWN AEROBIC AND ANAEROBIC Blood Culture adequate volume   Culture   Final    NO GROWTH < 12 HOURS Performed at [facility], [address], [place], [place] [address]    Report Status PENDING  Incomplete  CULTURE, BLOOD (ROUTINE X 2) w Reflex to ID Panel     Status: None (Preliminary result)   Collection Time: 11/17/20 10:35 PM   Specimen: BLOOD  Result Value Ref Range Status   Specimen Description BLOOD RIGHT FOREARM  Final   Special Requests   Final    BOTTLES DRAWN AEROBIC ONLY Blood Culture adequate volume   Culture   Final    NO GROWTH < 12 HOURS Performed at [facility], [address] [place], [place] [address]    Report Status PENDING  Incomplete         Radiology Studies: DG Foot Complete Left  Result Date: 11/17/2020 CLINICAL DATA:  Left heel wound for 3 months EXAM: LEFT FOOT - COMPLETE 3+ VIEW COMPARISON:  Foot radiographs 11/13/2020 FINDINGS: The bones are diffusely demineralized. Again seen is fragmentation of the calcaneus underlying a bandage presumably corresponding to the reported heel wound. There is no other acute osseous  abnormality. Foot alignment is stable. No fracture or dislocation is identified. IMPRESSION: Fragmentation of the calcaneus underlying the heel wound is again concerning for osteomyelitis. Electronically Signed   By: Valetta Mole M.D.   On: 11/17/2020 19:06        Scheduled Meds:  acetaminophen  1,000 mg Oral TID   atorvastatin  80 mg Oral QHS   heparin  5,000 Units Subcutaneous Q8H   insulin aspart  0-15 Units Subcutaneous TID WC   insulin aspart  0-5 Units Subcutaneous QHS   insulin glargine-yfgn  40 Units Subcutaneous QHS   ketorolac  15 mg Intravenous Q6H   losartan  25 mg Oral Daily   metoprolol succinate  25 mg Oral Daily   oxybutynin  10 mg Oral QHS   pantoprazole  80 mg  Oral QAC breakfast   vitamin B-12  1,000 mcg Oral Daily   Continuous Infusions:  sodium chloride 10 mL/hr at 11/18/20 0544   ceFEPime (MAXIPIME) IV Stopped (11/18/20 0317)   [START ON 11/19/2020] vancomycin       LOS: 0 days    Time spent: 25 minutes    Sidney Ace, MD [facility] Pager [phone]  If 7PM-7AM, please contact night-coverage 11/18/2020, 10:59 AM

## 2020-11-18 NOTE — TOC Progression Note (Signed)
Transition of Care [facility]) - Progression Note    Patient Details  Name: Andrea [name] MRN: [id number] Date of Birth: October 13, 1945  Transition of [facility]) CM/SW [place], RN Phone Number: 11/18/2020, 4:13 PM  Clinical Narrative:   Patient and daughters at bedside.  State patient came from home with Home Health due to heel wound.  They would like to wait until tomorrow for SNF discussion, as they would like to know a little more about the plan.    Patient went to SNF earlier this year after hip replacement.  She and her daughters state they do not want patient to return to [facility] if SNF is disposition.    TOC contact information given, TOC to follow to discharge.         Expected Discharge Plan and Services                                                 Social Determinants of Health (SDOH) Interventions    Readmission Risk Interventions Readmission Risk Prevention Plan 08/21/2020  Transportation Screening Complete  PCP or Specialist Appt within 5-7 Days Complete  Home Care Screening Complete  Medication Review (RN CM) Complete  Some recent data might be hidden

## 2020-11-18 NOTE — Consult Note (Signed)
PODIATRY CONSULTATION  NAME Andrea [name] MRN [id number] DOB 1945/12/25 DOA 11/17/2020   Reason for consult: Ulcer / OM calc LT Chief Complaint  Patient presents with   Wound Infection    Consulting physician: Dr. Ezequiel Essex  History of present illness: 75 y.o. female admitted through the ED for concern of worsening ulcer with osteomyelitis LT posterior heel.  Patient resting with her 2 daughters, [name] and Caryl Asp, in the room during evaluation.  History of the wound began when the patient had left hip surgery on 08/19/2020.  Apparently a cam boot was applied to the LLE intraoperatively to allow for distraction or to aid in the surgery in some form or fashion.  At that time a small wound developed which has progressively deteriorated over the past 3 months.  Was seen by podiatry outpatient on 10/21/2020 and 11/06/2020.  Since then the wound has deteriorated and progressed and her PCP recommended she presents to the emergency department.  MRI performed today  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   Depression    Diabetes mellitus type 2, uncomplicated ([place])    Diabetic retinopathy ([place])    Dupuytren contracture 2011   s/p surgery to LEFT hand   Frequent urinary tract infections    GERD (gastroesophageal reflux disease)    Grade I diastolic dysfunction    HTN (hypertension)    Hyperlipidemia, unspecified    Irritable bowel syndrome    Meningitis 11/2019   MI (myocardial infarction) ([place]) 1994   no stent   Neuromuscular disorder (HCC)    nerve pain in back and legs   Osteoporosis    Pneumonia 2021   RBBB    Sepsis ([place]) 11/2019   Skin cancer    TIA (transient ischemic attack) 2014   Vitamin D deficiency, unspecified    Wheezing      CBC Latest Ref Rng & Units 11/18/2020 11/17/2020 08/23/2020  WBC 4.0 - 10.5 K/uL 7.6 10.1 9.8  Hemoglobin 12.0 - 15.0 g/dL 9.8(L) 11.5(L) 8.2(L)  Hematocrit 36.0 - 46.0 % 30.4(L) 34.9(L) 24.9(L)  Platelets 150 - 400 K/uL 200 233 110(L)    BMP Latest Ref Rng & Units 11/18/2020 11/17/2020 08/26/2020  Glucose 70 - 99 mg/dL 131(H) 123(H) -  BUN 8 - 23 mg/dL 34(H) 33(H) -  Creatinine 0.44 - 1.00 mg/dL 0.77 0.97 1.31(H)  BUN/Creat Ratio 12 - 28 - - -  Sodium 135 - 145 mmol/L 138 138 -  Potassium 3.5 - 5.1 mmol/L 4.3 4.3 -  Chloride 98 - 111 mmol/L 107 104 -  CO2 22 - 32 mmol/L 25 29 -  Calcium 8.9 - 10.3 mg/dL 8.5(L) 9.1 -       Physical Exam: General: The patient is alert and oriented x3 in no acute distress.   Dermatology: Open wound right posterior Achilles with exposed tendon.  Please see attached photo under media  Vascular: VAS [place] ABI w/wo TBI 10/31/2020: Bilateral ABIs and TBIs appear essentially unchanged compared to prior study on 04/11/2020.     Summary:  Right: Resting right ankle-brachial index is within normal range. No  evidence of significant right lower extremity arterial disease. The right toe-brachial index is normal.  Left: Resting left ankle-brachial index is within normal range. No  evidence of significant left lower extremity arterial disease. The left  toe-brachial index is normal.   Neurological: Epicritic and protective threshold diminished bilaterally.   Musculoskeletal Exam: No structural deformity noted.  Associated tenderness around the ulcer site  MR Heel LT wo contrast 11/18/2020: FINDINGS: Bones/Joint/Cartilage There is bony edema and confluent low T1 signal within the posterior calcaneus. There is long segment intramedullary T2 hyperintense/T1 hypointense nonaggressive lesion within the partially visualized tibial diaphysis and metaphysis, could be developing bone infarct or marrow conversion changes.  IMPRESSION: Posterior heel soft tissue  ulcer with underlying osteomyelitis of the posterior calcaneus. Distal Achilles tendinosis and peritendinitis with ulcer-related disruption/erosion through the tendon at its insertion on the calcaneus.    ASSESSMENT/PLAN OF CARE Ulcer Achilles left.  Osteomyelitis calcaneus left -MRI, x-rays, vascular studies were all reviewed with the patient and daughters today. -Long detailed conversation with the patient and her daughters regarding the suspected osteomyelitis of the calcaneus which is not ideal and all possible outcomes which may even include below-knee amputation.  Regardless of the treatment, I did explain that this will be a long road to recovery.  In an attempt to salvage the foot, the osteomyelitis of the calcaneus would require posterior tubercle partial calcanectomy with placement of antibiotic beads and long-term outpatient wound care. -Prior to such a radical procedure, I recommended to the family simple debridement of the ulcer and bone biopsy to definitively diagnose the osteomyelitis.  The patient and family are in agreement. -Orders placed for debridement of posterior heel ulcer with bone biopsy left.  Surgery tentatively scheduled for tomorrow afternoon or evening.  N.p.o. after midnight -Continue IV antibiotics as per admitting physician.  Would recommend infectious disease consult since the patient will likely need long-term IV antibiotics post discharge depending on bone biopsy results -Will continue to follow      Thank you for the consult.  Please contact me directly with any questions or concerns.  Cell [phone]   Edrick Kins, DPM [facility]  Dr. Edrick Kins, DPM    [address], [place] [address]                Office [phone]  Fax [phone]

## 2020-11-19 ENCOUNTER — Encounter: Payer: Self-pay | Admitting: Internal Medicine

## 2020-11-19 ENCOUNTER — Telehealth: Payer: Self-pay

## 2020-11-19 DIAGNOSIS — M86272 Subacute osteomyelitis, left ankle and foot: Secondary | ICD-10-CM | POA: Diagnosis not present

## 2020-11-19 DIAGNOSIS — M86672 Other chronic osteomyelitis, left ankle and foot: Secondary | ICD-10-CM

## 2020-11-19 LAB — GLUCOSE, CAPILLARY
Glucose-Capillary: 85 mg/dL (ref 70–99)
Glucose-Capillary: 140 mg/dL — ABNORMAL HIGH (ref 70–99)
Glucose-Capillary: 146 mg/dL — ABNORMAL HIGH (ref 70–99)
Glucose-Capillary: 130 mg/dL — ABNORMAL HIGH (ref 70–99)

## 2020-11-19 LAB — SURGICAL PCR SCREEN
MRSA, PCR: NEGATIVE
Staphylococcus aureus: NEGATIVE

## 2020-11-19 MED ORDER — POLYETHYLENE GLYCOL 3350 17 G PO PACK
17.0000 g | PACK | Freq: Every day | ORAL | Status: DC
  Administered 2020-11-19 – 2020-11-26 (×8): 17 g via ORAL
  Filled 2020-11-19 (×7): qty 1

## 2020-11-19 MED ORDER — VANCOMYCIN HCL IN DEXTROSE 1-5 GM/200ML-% IV SOLN
1000.0000 mg | INTRAVENOUS | Status: DC
  Administered 2020-11-19: 1000 mg via INTRAVENOUS
  Filled 2020-11-19 (×2): qty 200

## 2020-11-19 MED ORDER — SENNA 8.6 MG PO TABS
1.0000 | ORAL_TABLET | Freq: Every day | ORAL | Status: DC
  Administered 2020-11-19 – 2020-11-26 (×7): 8.6 mg via ORAL
  Filled 2020-11-19 (×7): qty 1

## 2020-11-19 MED ORDER — INSULIN GLARGINE-YFGN 100 UNIT/ML ~~LOC~~ SOLN
35.0000 [IU] | Freq: Every day | SUBCUTANEOUS | Status: DC
  Administered 2020-11-19 – 2020-11-20 (×2): 35 [IU] via SUBCUTANEOUS
  Filled 2020-11-19 (×3): qty 0.35

## 2020-11-19 NOTE — Consult Note (Signed)
Pharmacy Antibiotic Note  Andrea [name] is a 75 y.o. female admitted on 11/17/2020 with  osteomyelitis .  Pharmacy has been consulted for cefepime and Vancomycin dosing.  Plan: Cefepime 2 gram Q12H Vancomycin 1500 mg LD x 1 followed by Vancomycin 1000 mg Q24H. Goal AUC 400-550 Estimated AUC: 546/Cmin 14 Scr: 0.80, IBW, Vd 0.72   Height: 5' (152.4 cm) Weight: 62.6 kg (138 lb) IBW/kg (Calculated) : 45.5  Temp (24hrs), Avg:98.1 F (36.7 C), Min:97.4 F (36.3 C), Max:98.7 F (37.1 C)  Recent Labs  Lab 11/17/20 1747 11/18/20 0503  WBC 10.1 7.6  CREATININE 0.97 0.77     Estimated Creatinine Clearance: 50.2 mL/min (by C-G formula based on SCr of 0.77 mg/dL).    Allergies  Allergen Reactions   Metformin And Related Diarrhea   Sulfa Antibiotics Rash    Antimicrobials this admission: 9/13 cefepime>>  9/13 vancomycin >>   Dose adjustments this admission: N/a  Microbiology results: 9/13 BCx: NG x 2 d 9/13 MRSA PCR: sent  9/15 planned biopsy during surgery  Thank you for allowing pharmacy to be a part of this patient's care.  Lu Duffel, PharmD, BCPS Clinical Pharmacist   11/19/2020 9:44 AM

## 2020-11-19 NOTE — Progress Notes (Signed)
Inpatient Diabetes Program Recommendations  AACE/ADA: New Consensus Statement on Inpatient Glycemic Control (2015)  Target Ranges:  Prepandial:   less than 140 mg/dL      Peak postprandial:   less than 180 mg/dL (1-2 hours)      Critically ill patients:  140 - 180 mg/dL   Lab Results  Component Value Date   GLUCAP 140 (H) 11/19/2020   HGBA1C 7.5 (H) 11/17/2020    Review of Glycemic Control Results for Andrea [name], Andrea [name] (MRN [id number]) as of 11/19/2020 12:13  Ref. Range 11/18/2020 07:42 11/18/2020 12:38 11/18/2020 17:23 11/18/2020 21:27 11/19/2020 07:49 11/19/2020 11:58  Glucose-Capillary Latest Ref Range: 70 - 99 mg/dL 96 164 (H) 121 (H) 183 (H) 85 140 (H)   Diabetes history: DM 2 Outpatient Diabetes medications: Lantus 40 units, Trulicity 1.5 mg weekly Current orders for Inpatient glycemic control:  Semglee 40 units qhs Novolog 0-15 units tid + hs  Inpatient Diabetes Program Recommendations:   Fasting CBG 85. Please consider: -Decrease in Semglee to 35 units qd. Secure chat sent to Dr. Si Raider.  Thank you, Nani Gasser. Javel Hersh, RN, MSN, CDE  Diabetes Coordinator Inpatient Glycemic Control Team Team Pager [phone] (8am-5pm) 11/19/2020 12:14 PM

## 2020-11-19 NOTE — Progress Notes (Signed)
[name] PROGRESS NOTE    Andrea [name]  [id number] DOB: Mar 08, 1946 DOA: 11/17/2020 PCP: [name], FNP   Brief Narrative:  75 y.o. female with medical history significant for insulin-dependent diabetes mellitus, hypertension, hyperlipidemia, GERD, history of meningitis, CAD, COPD, history of TIA, grade 1 diastolic dysfunction, depression, anxiety, osteoarthritis, who presents to the emergency department from home at the request of her PCP for chief concerns of osteomyelitis of the left heel.   She states that the wound has been there for 3 months and she has taken antibiotics in it and has not resolved.  She states that this started after her orthopedic boot was taken off and she thinks that there might have been a scratch.  She states that the wound has progressively worsened prompting her to present to her PCP.  Her PCP ordered a foot x-ray on 11/13/2020 and resulted today.  She endorses inability to bear weight due to the pain.   Assessment & Plan:   Principal Problem:   Osteomyelitis ([place]) Active Problems:   Chronic obstructive pulmonary disease (HCC)   HLD (hyperlipidemia)   Coronary artery disease involving native coronary artery   PAD (peripheral artery disease) (HCC)   History of meningitis   Status post total hip replacement, left   Cellulitis of left lower extremity  Left heel osteomyelitis No sepsis. Recent ABIs wnl b/l Plan: Continue vancomycin and cefepime As needed pain control, bowel regimen Okay for diet today Follow cultures, blood ngtd Podiatry planning on surgery tomorrow, npo pm  Insulin-dependent diabetes mellitus Decrease lantus to 35 carb modified diet Consulted diabetes coordinator   Hypertension PTA losartan 25 mg daily PTA Toprol-XL 25 mg daily  Hyperlipidemia PTA Lipitor 80 mg daily  Overactive bladder PTA oxybutynin 20 mg nightly  GERD PPI   History of meningitis and CSF leak No acute issues  DVT prophylaxis: SQ Lovenox Code  Status: Full Family Communication: none @ bedside Disposition Plan:Status is: Observation  The patient will require care spanning > 2 midnights and should be moved to inpatient because: Inpatient level of care appropriate due to severity of illness  Dispo: The patient is from: Home              Anticipated d/c is to: SNF              Patient currently is not medically stable to d/c.   Difficult to place patient No    Level of care: Med-Surg  Consultants:  Podiatry-Dr. Amalia Hailey  Procedures:  None  Antimicrobials:  Vancomycin Cefepime   Subjective: Seen and examined.  Says pain much improved. No chest pain, no DOE  Objective: Vitals:   11/18/20 1602 11/18/20 2222 11/19/20 0427 11/19/20 0813  BP: (!) 120/51 (!) 121/49 (!) 133/59 (!) 155/67  Pulse: 86 89 85 98  Resp: '16 18 18 14  '$ Temp: 98.7 F (37.1 C) 98.4 F (36.9 C) 98 F (36.7 C) (!) 97.4 F (36.3 C)  TempSrc:      SpO2: 100% 97% 98% 99%  Weight:      Height:        Intake/Output Summary (Last 24 hours) at 11/19/2020 1239 Last data filed at 11/19/2020 1021 Gross per 24 hour  Intake 684.99 ml  Output 700 ml  Net -15.01 ml   Filed Weights   11/17/20 1744  Weight: 62.6 kg    Examination:  General exam: Appears calm and comfortable  Respiratory system: Lungs clear.  Normal work of breathing.  Room air Cardiovascular  system: S1-S2, RRR, no murmurs, no pedal edema Gastrointestinal system: Abdomen is nondistended, soft and nontender. No organomegaly or masses felt. Normal bowel sounds heard. Central nervous system: Alert and oriented. No focal neurological deficits. Extremities: Left heel full-thickness ulcer with surrounding edema and serosanguineous drainage Skin: lft heel bandaged Psychiatry: Judgement and insight appear normal. Mood & affect appropriate.     Data Reviewed: I have personally reviewed following labs and imaging studies  CBC: Recent Labs  Lab 11/17/20 1747 11/18/20 0503  WBC 10.1 7.6   HGB 11.5* 9.8*  HCT 34.9* 30.4*  MCV 87.3 88.1  PLT 233 [id number]   Basic Metabolic Panel: Recent Labs  Lab 11/17/20 1747 11/18/20 0503  NA 138 138  K 4.3 4.3  CL 104 107  CO2 29 25  GLUCOSE 123* 131*  BUN 33* 34*  CREATININE 0.97 0.77  CALCIUM 9.1 8.5*   GFR: Estimated Creatinine Clearance: 50.2 mL/min (by C-G formula based on SCr of 0.77 mg/dL). Liver Function Tests: No results for input(s): AST, ALT, ALKPHOS, BILITOT, PROT, ALBUMIN in the last 168 hours. No results for input(s): LIPASE, AMYLASE in the last 168 hours. No results for input(s): AMMONIA in the last 168 hours. Coagulation Profile: No results for input(s): INR, PROTIME in the last 168 hours. Cardiac Enzymes: No results for input(s): CKTOTAL, CKMB, CKMBINDEX, TROPONINI in the last 168 hours. BNP (last 3 results) No results for input(s): PROBNP in the last 8760 hours. HbA1C: Recent Labs    11/17/20 2234  HGBA1C 7.5*   CBG: Recent Labs  Lab 11/18/20 1238 11/18/20 1723 11/18/20 2127 11/19/20 0749 11/19/20 1158  GLUCAP 164* 121* 183* 85 140*   Lipid Profile: No results for input(s): CHOL, HDL, LDLCALC, TRIG, CHOLHDL, LDLDIRECT in the last 72 hours. Thyroid Function Tests: No results for input(s): TSH, T4TOTAL, FREET4, T3FREE, THYROIDAB in the last 72 hours. Anemia Panel: No results for input(s): VITAMINB12, FOLATE, FERRITIN, TIBC, IRON, RETICCTPCT in the last 72 hours. Sepsis Labs: No results for input(s): PROCALCITON, LATICACIDVEN in the last 168 hours.  Recent Results (from the past 240 hour(s))  Resp Panel by RT-PCR (Flu A&B, Covid) Nasopharyngeal Swab     Status: None   Collection Time: 11/17/20  7:45 AM   Specimen: Nasopharyngeal Swab; Nasopharyngeal(NP) swabs in vial transport medium  Result Value Ref Range Status   SARS Coronavirus 2 by RT PCR NEGATIVE NEGATIVE Final    Comment: (NOTE) SARS-CoV-2 target nucleic acids are NOT DETECTED.  The SARS-CoV-2 RNA is generally detectable in upper  respiratory specimens during the acute phase of infection. The lowest concentration of SARS-CoV-2 viral copies this assay can detect is 138 copies/mL. A negative result does not preclude SARS-Cov-2 infection and should not be used as the sole basis for treatment or other patient management decisions. A negative result may occur with  improper specimen collection/handling, submission of specimen other than nasopharyngeal swab, presence of viral mutation(s) within the areas targeted by this assay, and inadequate number of viral copies(<138 copies/mL). A negative result must be combined with clinical observations, patient history, and epidemiological information. The expected result is Negative.  Fact Sheet for Patients:  EntrepreneurPulse.com.au  Fact Sheet for Healthcare Providers:  IncredibleEmployment.be  This test is no t yet approved or cleared by the [place] FDA and  has been authorized for detection and/or diagnosis of SARS-CoV-2 by FDA under an Emergency Use Authorization (EUA). This EUA will remain  in effect (meaning this test can be used) for the duration of the COVID-19  declaration under Section 564(b)(1) of the Act, 21 U.S.C.section 360bbb-3(b)(1), unless the authorization is terminated  or revoked sooner.       Influenza A by PCR NEGATIVE NEGATIVE Final   Influenza B by PCR NEGATIVE NEGATIVE Final    Comment: (NOTE) The Xpert Xpress SARS-CoV-2/FLU/RSV plus assay is intended as an aid in the diagnosis of influenza from Nasopharyngeal swab specimens and should not be used as a sole basis for treatment. Nasal washings and aspirates are unacceptable for Xpert Xpress SARS-CoV-2/FLU/RSV testing.  Fact Sheet for Patients: EntrepreneurPulse.com.au  Fact Sheet for Healthcare Providers: IncredibleEmployment.be  This test is not yet approved or cleared by the [place] FDA and has been  authorized for detection and/or diagnosis of SARS-CoV-2 by FDA under an Emergency Use Authorization (EUA). This EUA will remain in effect (meaning this test can be used) for the duration of the COVID-19 declaration under Section 564(b)(1) of the Act, 21 U.S.C. section 360bbb-3(b)(1), unless the authorization is terminated or revoked.  Performed at [facility], [address] [place], [place] [address]   CULTURE, BLOOD (ROUTINE X 2) w Reflex to ID Panel     Status: None (Preliminary result)   Collection Time: 11/17/20 10:34 PM   Specimen: BLOOD  Result Value Ref Range Status   Specimen Description BLOOD LEFT FOREARM  Final   Special Requests   Final    BOTTLES DRAWN AEROBIC AND ANAEROBIC Blood Culture adequate volume   Culture   Final    NO GROWTH 2 DAYS Performed at [facility], [address], [place], [place] [address]    Report Status PENDING  Incomplete  CULTURE, BLOOD (ROUTINE X 2) w Reflex to ID Panel     Status: None (Preliminary result)   Collection Time: 11/17/20 10:35 PM   Specimen: BLOOD  Result Value Ref Range Status   Specimen Description BLOOD RIGHT FOREARM  Final   Special Requests   Final    BOTTLES DRAWN AEROBIC ONLY Blood Culture adequate volume   Culture   Final    NO GROWTH 2 DAYS Performed at [facility], [address] [place], [place] [address]    Report Status PENDING  Incomplete         Radiology Studies: MR HEEL LEFT WO CONTRAST  Result Date: 11/18/2020 CLINICAL DATA:  Osteomyelitis, foot EXAM: MR OF THE LEFT HEEL WITHOUT CONTRAST TECHNIQUE: Multiplanar, multisequence MR imaging of the left heel was performed. No intravenous contrast was administered. COMPARISON:  Left foot radiograph 11/17/2020 FINDINGS: Bones/Joint/Cartilage There is bony edema and confluent low T1 signal within the posterior calcaneus. There is long segment intramedullary T2 hyperintense/T1 hypointense nonaggressive lesion within the partially  visualized tibial diaphysis and metaphysis, could be developing bone infarct or marrow conversion changes. Ligaments No evidence of ligament tear. Muscles and Tendons There is distal Achilles tendinosis and peritendinitis with disruption of the tendon at its insertion on the calcaneus. There are intact fibers along the medial aspect inserting on the calcaneus. Soft tissues There is a posterior heel soft tissue ulcer. Generalized soft tissue swelling of the ankle. No visible abscess. IMPRESSION: Posterior heel soft tissue ulcer with underlying osteomyelitis of the posterior calcaneus. Distal Achilles tendinosis and peritendinitis with ulcer-related disruption/erosion through the tendon at its insertion on the calcaneus. Electronically Signed   By: Maurine Simmering M.D.   On: 11/18/2020 14:07   DG Foot Complete Left  Result Date: 11/17/2020 CLINICAL DATA:  Left heel wound for 3 months EXAM: LEFT FOOT -  COMPLETE 3+ VIEW COMPARISON:  Foot radiographs 11/13/2020 FINDINGS: The bones are diffusely demineralized. Again seen is fragmentation of the calcaneus underlying a bandage presumably corresponding to the reported heel wound. There is no other acute osseous abnormality. Foot alignment is stable. No fracture or dislocation is identified. IMPRESSION: Fragmentation of the calcaneus underlying the heel wound is again concerning for osteomyelitis. Electronically Signed   By: Valetta Mole M.D.   On: 11/17/2020 19:06        Scheduled Meds:  acetaminophen  1,000 mg Oral TID   atorvastatin  80 mg Oral QHS   enoxaparin (LOVENOX) injection  40 mg Subcutaneous Q24H   insulin aspart  0-15 Units Subcutaneous TID WC   insulin aspart  0-5 Units Subcutaneous QHS   insulin glargine-yfgn  40 Units Subcutaneous QHS   ketorolac  15 mg Intravenous Q6H   losartan  25 mg Oral Daily   metoprolol succinate  25 mg Oral Daily   oxybutynin  10 mg Oral QHS   pantoprazole  80 mg Oral QAC breakfast   vitamin B-12  1,000 mcg Oral Daily    Continuous Infusions:  sodium chloride Stopped (11/18/20 1318)   ceFEPime (MAXIPIME) IV 2 g (11/19/20 0248)   vancomycin       LOS: 1 day    Time spent: 25 minutes    Desma Maxim, MD [facility]  If 7PM-7AM, please contact night-coverage 11/19/2020, 12:39 PM

## 2020-11-19 NOTE — Telephone Encounter (Signed)
Copied from [place] [id number]. Topic: General - Other >> Nov 17, 2020  8:59 AM Celene Kras wrote: Reason for CRM: Pts daughter, Caryl Asp, calling on behalf of pt. She states that the pt is still waiting on imaging results from last week and is requesting to have a call back to discuss. Please advise. >> Nov 17, 2020  6:52 PM Georgina Peer, [place] wrote: This is not a [facility] patient.

## 2020-11-19 NOTE — Progress Notes (Signed)
Spoke with daughter, [name], on the phone.  Due to OR scheduling and availability surgery will be tomorrow, 11/20/2020.  N.p.o. status discontinued.  We will resume n.p.o. tomorrow a.m.

## 2020-11-20 ENCOUNTER — Inpatient Hospital Stay: Payer: Medicare Other

## 2020-11-20 ENCOUNTER — Encounter
Admission: EM | Disposition: A | Discharge status: Skilled Nursing Facility | Payer: Self-pay | Source: Home / Self Care | Attending: Obstetrics and Gynecology

## 2020-11-20 ENCOUNTER — Telehealth: Payer: Self-pay

## 2020-11-20 ENCOUNTER — Encounter: Payer: Self-pay | Admitting: Internal Medicine

## 2020-11-20 ENCOUNTER — Inpatient Hospital Stay: Payer: Medicare Other | Admitting: Anesthesiology

## 2020-11-20 DIAGNOSIS — M86272 Subacute osteomyelitis, left ankle and foot: Secondary | ICD-10-CM | POA: Diagnosis not present

## 2020-11-20 DIAGNOSIS — M86672 Other chronic osteomyelitis, left ankle and foot: Secondary | ICD-10-CM

## 2020-11-20 HISTORY — PX: IRRIGATION AND DEBRIDEMENT FOOT: SHX6602

## 2020-11-20 HISTORY — PX: BONE BIOPSY: SHX375

## 2020-11-20 LAB — GLUCOSE, CAPILLARY
Glucose-Capillary: 140 mg/dL — ABNORMAL HIGH (ref 70–99)
Glucose-Capillary: 90 mg/dL (ref 70–99)
Glucose-Capillary: 85 mg/dL (ref 70–99)
Glucose-Capillary: 89 mg/dL (ref 70–99)
Glucose-Capillary: 167 mg/dL — ABNORMAL HIGH (ref 70–99)

## 2020-11-20 LAB — BASIC METABOLIC PANEL
Anion gap: 6 (ref 5–15)
BUN: 33 mg/dL — ABNORMAL HIGH (ref 8–23)
CO2: 21 mmol/L — ABNORMAL LOW (ref 22–32)
Calcium: 8.3 mg/dL — ABNORMAL LOW (ref 8.9–10.3)
Chloride: 109 mmol/L (ref 98–111)
Creatinine, Ser: 1.49 mg/dL — ABNORMAL HIGH (ref 0.44–1.00)
GFR, Estimated: 36 mL/min — ABNORMAL LOW (ref >60)
Glucose, Bld: 170 mg/dL — ABNORMAL HIGH (ref 70–99)
Potassium: 4.7 mmol/L (ref 3.5–5.1)
Sodium: 136 mmol/L (ref 135–145)

## 2020-11-20 LAB — CBC
HCT: 30.5 % — ABNORMAL LOW (ref 36.0–46.0)
Hemoglobin: 9.6 g/dL — ABNORMAL LOW (ref 12.0–15.0)
MCH: 27.9 pg (ref 26.0–34.0)
MCHC: 31.5 g/dL (ref 30.0–36.0)
MCV: 88.7 fL (ref 80.0–100.0)
Platelets: 183 10*3/uL (ref 150–400)
RBC: 3.44 MIL/uL — ABNORMAL LOW (ref 3.87–5.11)
RDW: 16 % — ABNORMAL HIGH (ref 11.5–15.5)
WBC: 7 10*3/uL (ref 4.0–10.5)
nRBC: 0 % (ref 0.0–0.2)

## 2020-11-20 SURGERY — IRRIGATION AND DEBRIDEMENT FOOT
Anesthesia: General | Site: Heel | Laterality: Left

## 2020-11-20 MED ORDER — HYDROMORPHONE HCL 1 MG/ML IJ SOLN
INTRAMUSCULAR | Status: AC
  Filled 2020-11-20: qty 1

## 2020-11-20 MED ORDER — ESMOLOL HCL 100 MG/10ML IV SOLN
INTRAVENOUS | Status: DC | PRN
  Administered 2020-11-20 (×2): 10 mg via INTRAVENOUS

## 2020-11-20 MED ORDER — LINEZOLID 600 MG/300ML IV SOLN
600.0000 mg | Freq: Two times a day (BID) | INTRAVENOUS | Status: DC
  Administered 2020-11-21 – 2020-11-24 (×8): 600 mg via INTRAVENOUS
  Filled 2020-11-20 (×9): qty 300

## 2020-11-20 MED ORDER — BUPIVACAINE HCL (PF) 0.5 % IJ SOLN
INTRAMUSCULAR | Status: AC
  Filled 2020-11-20: qty 30

## 2020-11-20 MED ORDER — FENTANYL CITRATE (PF) 100 MCG/2ML IJ SOLN
INTRAMUSCULAR | Status: DC | PRN
  Administered 2020-11-20 (×2): 50 ug via INTRAVENOUS

## 2020-11-20 MED ORDER — PHENYLEPHRINE HCL (PRESSORS) 10 MG/ML IV SOLN
INTRAVENOUS | Status: DC | PRN
  Administered 2020-11-20: 100 ug via INTRAVENOUS

## 2020-11-20 MED ORDER — SEVOFLURANE IN SOLN
RESPIRATORY_TRACT | Status: AC
  Filled 2020-11-20: qty 250

## 2020-11-20 MED ORDER — PROPOFOL 10 MG/ML IV BOLUS
INTRAVENOUS | Status: DC | PRN
  Administered 2020-11-20: 120 mg via INTRAVENOUS

## 2020-11-20 MED ORDER — SODIUM CHLORIDE 0.9 % IV SOLN
INTRAVENOUS | Status: DC | PRN
  Administered 2020-11-20: 25 ug/min via INTRAVENOUS

## 2020-11-20 MED ORDER — PROMETHAZINE HCL 25 MG/ML IJ SOLN: 6.2500 mg | INTRAMUSCULAR | Status: DC | PRN

## 2020-11-20 MED ORDER — FENTANYL CITRATE (PF) 100 MCG/2ML IJ SOLN
INTRAMUSCULAR | Status: AC
  Administered 2020-11-20: 50 ug via INTRAVENOUS
  Filled 2020-11-20: qty 2

## 2020-11-20 MED ORDER — OXYCODONE HCL 5 MG/5ML PO SOLN
5.0000 mg | Freq: Once | ORAL | Status: AC | PRN
Start: 2020-11-20 — End: 2020-11-20

## 2020-11-20 MED ORDER — LIDOCAINE HCL (PF) 2 % IJ SOLN
INTRAMUSCULAR | Status: AC
  Filled 2020-11-20: qty 5

## 2020-11-20 MED ORDER — SODIUM CHLORIDE 0.9 % IV SOLN: INTRAVENOUS | Status: DC

## 2020-11-20 MED ORDER — PROMETHAZINE HCL 25 MG/ML IJ SOLN
INTRAMUSCULAR | Status: AC
  Administered 2020-11-20: 6.25 mg via INTRAVENOUS
  Filled 2020-11-20: qty 1

## 2020-11-20 MED ORDER — TOBRAMYCIN SULFATE 1.2 G IJ SOLR
INTRAMUSCULAR | Status: DC | PRN
  Administered 2020-11-20: 600 mg

## 2020-11-20 MED ORDER — VANCOMYCIN HCL 1000 MG IV SOLR
INTRAVENOUS | Status: AC
  Filled 2020-11-20: qty 20

## 2020-11-20 MED ORDER — OXYCODONE HCL 5 MG PO TABS
ORAL_TABLET | ORAL | Status: AC
  Administered 2020-11-20: 5 mg
  Filled 2020-11-20: qty 1

## 2020-11-20 MED ORDER — GLYCOPYRROLATE 0.2 MG/ML IJ SOLN
INTRAMUSCULAR | Status: DC | PRN
Start: 2020-11-20 — End: 2020-11-20
  Administered 2020-11-20: .2 mg via INTRAVENOUS

## 2020-11-20 MED ORDER — SODIUM CHLORIDE FLUSH 0.9 % IV SOLN
INTRAVENOUS | Status: AC
  Filled 2020-11-20: qty 10

## 2020-11-20 MED ORDER — HYDROMORPHONE HCL 1 MG/ML IJ SOLN
0.5000 mg | INTRAMUSCULAR | Status: DC | PRN
  Administered 2020-11-20: 0.5 mg via INTRAVENOUS

## 2020-11-20 MED ORDER — DEXAMETHASONE SODIUM PHOSPHATE 10 MG/ML IJ SOLN
INTRAMUSCULAR | Status: AC
  Filled 2020-11-20: qty 1

## 2020-11-20 MED ORDER — FENTANYL CITRATE (PF) 100 MCG/2ML IJ SOLN
INTRAMUSCULAR | Status: AC
  Filled 2020-11-20: qty 2

## 2020-11-20 MED ORDER — ACETAMINOPHEN 10 MG/ML IV SOLN
INTRAVENOUS | Status: AC
  Filled 2020-11-20: qty 100

## 2020-11-20 MED ORDER — TOBRAMYCIN SULFATE 1.2 G IJ SOLR
INTRAMUSCULAR | Status: AC
  Filled 2020-11-20: qty 1.2

## 2020-11-20 MED ORDER — ACETAMINOPHEN 10 MG/ML IV SOLN
INTRAVENOUS | Status: DC | PRN
  Administered 2020-11-20: 1000 mg via INTRAVENOUS

## 2020-11-20 MED ORDER — PROPOFOL 10 MG/ML IV BOLUS
INTRAVENOUS | Status: AC
  Filled 2020-11-20: qty 20

## 2020-11-20 MED ORDER — OXYCODONE HCL 5 MG PO TABS
5.0000 mg | ORAL_TABLET | Freq: Once | ORAL | Status: AC | PRN
  Administered 2020-11-20: 5 mg via ORAL

## 2020-11-20 MED ORDER — ONDANSETRON HCL 4 MG/2ML IJ SOLN
INTRAMUSCULAR | Status: DC | PRN
  Administered 2020-11-20: 4 mg via INTRAVENOUS

## 2020-11-20 MED ORDER — GLYCOPYRROLATE 0.2 MG/ML IJ SOLN
INTRAMUSCULAR | Status: AC
  Filled 2020-11-20: qty 1

## 2020-11-20 MED ORDER — VANCOMYCIN HCL 500 MG IV SOLR
INTRAVENOUS | Status: DC | PRN
  Administered 2020-11-20: 500 mg

## 2020-11-20 MED ORDER — PROPOFOL 500 MG/50ML IV EMUL
INTRAVENOUS | Status: AC
  Filled 2020-11-20: qty 50

## 2020-11-20 MED ORDER — LIDOCAINE HCL (CARDIAC) PF 100 MG/5ML IV SOSY
PREFILLED_SYRINGE | INTRAVENOUS | Status: DC | PRN
  Administered 2020-11-20: 80 mg via INTRAVENOUS

## 2020-11-20 MED ORDER — FENTANYL CITRATE (PF) 100 MCG/2ML IJ SOLN
25.0000 ug | INTRAMUSCULAR | Status: DC | PRN
  Administered 2020-11-20: 50 ug via INTRAVENOUS

## 2020-11-20 MED ORDER — ROCURONIUM BROMIDE 100 MG/10ML IV SOLN
INTRAVENOUS | Status: DC | PRN
  Administered 2020-11-20: 30 mg via INTRAVENOUS

## 2020-11-20 MED ORDER — DEXAMETHASONE SODIUM PHOSPHATE 10 MG/ML IJ SOLN
INTRAMUSCULAR | Status: DC | PRN
  Administered 2020-11-20: 5 mg via INTRAVENOUS

## 2020-11-20 MED ORDER — ROCURONIUM BROMIDE 10 MG/ML (PF) SYRINGE
PREFILLED_SYRINGE | INTRAVENOUS | Status: AC
  Filled 2020-11-20: qty 10

## 2020-11-20 MED ORDER — 0.9 % SODIUM CHLORIDE (POUR BTL) OPTIME
TOPICAL | Status: DC | PRN
  Administered 2020-11-20: 500 mL

## 2020-11-20 MED ORDER — LIDOCAINE HCL (PF) 1 % IJ SOLN
INTRAMUSCULAR | Status: AC
  Filled 2020-11-20: qty 30

## 2020-11-20 MED ORDER — SUGAMMADEX SODIUM 500 MG/5ML IV SOLN
INTRAVENOUS | Status: DC | PRN
  Administered 2020-11-20: 400 mg via INTRAVENOUS

## 2020-11-20 MED ORDER — ONDANSETRON HCL 4 MG/2ML IJ SOLN
INTRAMUSCULAR | Status: AC
  Filled 2020-11-20: qty 2

## 2020-11-20 MED ORDER — SODIUM CHLORIDE 0.9 % IV SOLN
2.0000 g | INTRAVENOUS | Status: DC
  Administered 2020-11-21: 2 g via INTRAVENOUS
  Filled 2020-11-20: qty 2

## 2020-11-20 MED ORDER — ACETAMINOPHEN 10 MG/ML IV SOLN: 1000.0000 mg | Freq: Once | INTRAVENOUS | Status: DC | PRN

## 2020-11-20 MED ORDER — SUCCINYLCHOLINE CHLORIDE 200 MG/10ML IV SOSY
PREFILLED_SYRINGE | INTRAVENOUS | Status: AC
  Filled 2020-11-20: qty 10

## 2020-11-20 MED ORDER — ENOXAPARIN SODIUM 30 MG/0.3ML IJ SOSY: 30.0000 mg | PREFILLED_SYRINGE | INTRAMUSCULAR | Status: DC

## 2020-11-20 SURGICAL SUPPLY — 65 items
BNDG COHESIVE 4X5 TAN ST LF (GAUZE/BANDAGES/DRESSINGS) ×2 IMPLANT
BNDG COHESIVE 6X5 TAN ST LF (GAUZE/BANDAGES/DRESSINGS) ×2 IMPLANT
BNDG CONFORM 2 STRL LF (GAUZE/BANDAGES/DRESSINGS) ×2 IMPLANT
BNDG CONFORM 3 STRL LF (GAUZE/BANDAGES/DRESSINGS) ×2 IMPLANT
BNDG ELASTIC 4X5.8 VLCR STR LF (GAUZE/BANDAGES/DRESSINGS) ×2 IMPLANT
BNDG ESMARK 4X12 TAN STRL LF (GAUZE/BANDAGES/DRESSINGS) ×2 IMPLANT
BNDG GAUZE ELAST 4 BULKY (GAUZE/BANDAGES/DRESSINGS) ×3 IMPLANT
CANISTER WOUND CARE 500ML ATS (WOUND CARE) ×1 IMPLANT
CNTNR SPEC 2.5X3XGRAD LEK (MISCELLANEOUS) ×1
CONT SPEC 4OZ STER OR WHT (MISCELLANEOUS) ×1
CONT SPEC 4OZ STRL OR WHT (MISCELLANEOUS) ×1
CONTAINER SPEC 2.5X3XGRAD LEK (MISCELLANEOUS) IMPLANT
CUFF TOURN SGL QUICK 18X4 (TOURNIQUET CUFF) ×1 IMPLANT
CUFF TOURN SGL QUICK 24 (TOURNIQUET CUFF)
CUFF TRNQT CYL 24X4X16.5-23 (TOURNIQUET CUFF) IMPLANT
DRSG ADAPTIC 3X8 NADH LF (GAUZE/BANDAGES/DRESSINGS) ×2 IMPLANT
DRSG EMULSION OIL 3X3 NADH (GAUZE/BANDAGES/DRESSINGS) ×1 IMPLANT
DURAPREP 26ML APPLICATOR (WOUND CARE) ×2 IMPLANT
ELECT REM PT RETURN 9FT ADLT (ELECTROSURGICAL) ×2
ELECTRODE REM PT RTRN 9FT ADLT (ELECTROSURGICAL) ×1 IMPLANT
GAUZE 4X4 16PLY ~~LOC~~+RFID DBL (SPONGE) ×2 IMPLANT
GAUZE PACKING 1/4 X5 YD (GAUZE/BANDAGES/DRESSINGS) IMPLANT
GAUZE PACKING IODOFORM 1X5 (PACKING) IMPLANT
GAUZE SPONGE 4X4 12PLY STRL (GAUZE/BANDAGES/DRESSINGS) ×2 IMPLANT
GLOVE SRG 8 PF TXTR STRL LF DI (GLOVE) ×1 IMPLANT
GLOVE SURG ENC TEXT LTX SZ8 (GLOVE) ×2 IMPLANT
GLOVE SURG UNDER POLY LF SZ8 (GLOVE) ×2
GOWN STRL REUS W/ TWL XL LVL3 (GOWN DISPOSABLE) ×1 IMPLANT
GOWN STRL REUS W/TWL MED LVL3 (GOWN DISPOSABLE) ×2 IMPLANT
GOWN STRL REUS W/TWL XL LVL3 (GOWN DISPOSABLE) ×2
HANDPIECE VERSAJET DEBRIDEMENT (MISCELLANEOUS) ×1 IMPLANT
IV NS 1000ML (IV SOLUTION) ×2
IV NS 1000ML BAXH (IV SOLUTION) ×1 IMPLANT
IV NS IRRIG 3000ML ARTHROMATIC (IV SOLUTION) IMPLANT
KIT DRSG VAC SLVR GRANUFM (MISCELLANEOUS) ×1 IMPLANT
KIT STIMULAN RAPID CURE 5CC (Orthopedic Implant) ×1 IMPLANT
KIT TURNOVER KIT A (KITS) ×2 IMPLANT
LABEL OR SOLS (LABEL) ×2 IMPLANT
MANIFOLD NEPTUNE II (INSTRUMENTS) ×2 IMPLANT
NDL FILTER BLUNT 18X1 1/2 (NEEDLE) ×1 IMPLANT
NDL HYPO 25X1 1.5 SAFETY (NEEDLE) ×1 IMPLANT
NEEDLE FILTER BLUNT 18X 1/2SAF (NEEDLE) ×1
NEEDLE FILTER BLUNT 18X1 1/2 (NEEDLE) ×1 IMPLANT
NEEDLE HYPO 25X1 1.5 SAFETY (NEEDLE) ×2 IMPLANT
NS IRRIG 500ML POUR BTL (IV SOLUTION) ×2 IMPLANT
PACK EXTREMITY ARMC (MISCELLANEOUS) ×2 IMPLANT
PAD ABD DERMACEA PRESS 5X9 (GAUZE/BANDAGES/DRESSINGS) ×3 IMPLANT
PULSAVAC PLUS IRRIG FAN TIP (DISPOSABLE)
SOL PREP PVP 2OZ (MISCELLANEOUS) ×2
SOLUTION PREP PVP 2OZ (MISCELLANEOUS) ×1 IMPLANT
SPONGE T-LAP 18X18 ~~LOC~~+RFID (SPONGE) ×1 IMPLANT
STAPLER SKIN PROX 35W (STAPLE) IMPLANT
STOCKINETTE IMPERVIOUS 9X36 MD (GAUZE/BANDAGES/DRESSINGS) ×2 IMPLANT
SUT ETHILON 2 0 FS 18 (SUTURE) IMPLANT
SUT ETHILON 4-0 (SUTURE)
SUT ETHILON 4-0 FS2 18XMFL BLK (SUTURE)
SUT PROLENE 3 0 PS 2 (SUTURE) IMPLANT
SUT VIC AB 3-0 SH 27 (SUTURE)
SUT VIC AB 3-0 SH 27X BRD (SUTURE) IMPLANT
SUT VIC AB 4-0 FS2 27 (SUTURE) IMPLANT
SUTURE ETHLN 4-0 FS2 18XMF BLK (SUTURE) IMPLANT
SWAB CULTURE AMIES ANAERIB BLU (MISCELLANEOUS) ×2 IMPLANT
SYR 10ML LL (SYRINGE) ×4 IMPLANT
TIP FAN IRRIG PULSAVAC PLUS (DISPOSABLE) IMPLANT
WATER STERILE IRR 500ML POUR (IV SOLUTION) ×2 IMPLANT

## 2020-11-20 NOTE — Progress Notes (Signed)
Andrea [name] PROGRESS NOTE    Andrea [name]  [id number] DOB: [birth year] DOA: 11/17/2020 PCP: [name], FNP   Brief Narrative:  75 y.o. female with medical history significant for insulin-dependent diabetes mellitus, hypertension, hyperlipidemia, GERD, history of meningitis, CAD, COPD, history of TIA, grade 1 diastolic dysfunction, depression, anxiety, osteoarthritis, who presents to the emergency department from home at the request of her PCP for chief concerns of osteomyelitis of the left heel.   She states that the wound has been there for 3 months and she has taken antibiotics in it and has not resolved.  She states that this started after her orthopedic boot was taken off and she thinks that there might have been a scratch.  She states that the wound has progressively worsened prompting her to present to her PCP.  Her PCP ordered a foot x-ray on 11/13/2020 and resulted today.  She endorses inability to bear weight due to the pain.   Assessment & Plan:   Principal Problem:   Osteomyelitis ([place]) Active Problems:   Chronic obstructive pulmonary disease (HCC)   HLD (hyperlipidemia)   Coronary artery disease involving native coronary artery   PAD (peripheral artery disease) (HCC)   History of meningitis   Status post total hip replacement, left   Cellulitis of left lower extremity  Left heel osteomyelitis No sepsis. Recent ABIs wnl b/l Plan: Continue linezolid and cefepime (switch today to linezolid given aki); As needed pain control, bowel regimen Okay for diet today Follow cultures, blood ngtd Surgery today PT/OT tomorrow, will also make abx plan then  Acute kidney injury Poss 2/2 vanc, discontinued today. Also holding nsaid and arb. - monitor - 24 hours NS @ 100/hr  Insulin-dependent diabetes mellitus Glucose mildly elevated today lantus 35 carb modified diet Consulted diabetes coordinator   Hypertension Well controlled Stop losartan as above PTA Toprol-XL 25 mg  daily  Hyperlipidemia PTA Lipitor 80 mg daily  Overactive bladder PTA oxybutynin 20 mg nightly  GERD PPI   History of meningitis and CSF leak No acute issues  DVT prophylaxis: SQ Lovenox Code Status: Full Family Communication: daughter joy updated telephonically 9/15  Disposition Plan:Status is: Observation  The patient will require care spanning > 2 midnights and should be moved to inpatient because: Inpatient level of care appropriate due to severity of illness  Dispo: The patient is from: Home              Anticipated d/c is to: SNF              Patient currently is not medically stable to d/c.   Difficult to place patient No    Level of care: Med-Surg  Consultants:  Podiatry-Dr. Amalia Hailey  Procedures:  None  Antimicrobials:  linezolid Cefepime   Subjective: Seen and examined.  Says pain much improved. No chest pain, no DOE.   Objective: Vitals:   11/19/20 0813 11/19/20 2158 11/20/20 0533 11/20/20 0818  BP: (!) 155/67 (!) 130/50 (!) 142/63 137/68  Pulse: 98 (!) 102 97 (!) 101  Resp: '14 20 18 15  '$ Temp: (!) 97.4 F (36.3 C) 97.6 F (36.4 C) 97.7 F (36.5 C) 98.3 F (36.8 C)  TempSrc:  Oral Oral   SpO2: 99% 98% 98% 98%  Weight:      Height:        Intake/Output Summary (Last 24 hours) at 11/20/2020 1123 Last data filed at 11/20/2020 0221 Gross per 24 hour  Intake 980 ml  Output 650 ml  Net  330 ml   Filed Weights   11/17/20 1744  Weight: 62.6 kg    Examination:  General exam: Appears calm and comfortable  Respiratory system: Lungs clear.  Normal work of breathing.  Room air Cardiovascular system: S1-S2, RRR, no murmurs, no pedal edema Gastrointestinal system: Abdomen is nondistended, soft and nontender. No organomegaly or masses felt. Normal bowel sounds heard. Central nervous system: Alert and oriented. No focal neurological deficits. Extremities: Left heel full-thickness ulcer with surrounding edema and serosanguineous drainage Skin: lft  heel bandaged Psychiatry: Judgement and insight appear normal. Mood & affect appropriate.     Data Reviewed: I have personally reviewed following labs and imaging studies  CBC: Recent Labs  Lab 11/17/20 1747 11/18/20 0503 11/20/20 0247  WBC 10.1 7.6 7.0  HGB 11.5* 9.8* 9.6*  HCT 34.9* 30.4* 30.5*  MCV 87.3 88.1 88.7  PLT 233 200 [id number]   Basic Metabolic Panel: Recent Labs  Lab 11/17/20 1747 11/18/20 0503 11/20/20 0247  NA 138 138 136  K 4.3 4.3 4.7  CL 104 107 109  CO2 29 25 21*  GLUCOSE 123* 131* 170*  BUN 33* 34* 33*  CREATININE 0.97 0.77 1.49*  CALCIUM 9.1 8.5* 8.3*   GFR: Estimated Creatinine Clearance: 26.9 mL/min (A) (by C-G formula based on SCr of 1.49 mg/dL (H)). Liver Function Tests: No results for input(s): AST, ALT, ALKPHOS, BILITOT, PROT, ALBUMIN in the last 168 hours. No results for input(s): LIPASE, AMYLASE in the last 168 hours. No results for input(s): AMMONIA in the last 168 hours. Coagulation Profile: No results for input(s): INR, PROTIME in the last 168 hours. Cardiac Enzymes: No results for input(s): CKTOTAL, CKMB, CKMBINDEX, TROPONINI in the last 168 hours. BNP (last 3 results) No results for input(s): PROBNP in the last 8760 hours. HbA1C: Recent Labs    11/17/20 2234  HGBA1C 7.5*   CBG: Recent Labs  Lab 11/19/20 0749 11/19/20 1158 11/19/20 1546 11/19/20 2101 11/20/20 0739  GLUCAP 85 140* 146* 130* 140*   Lipid Profile: No results for input(s): CHOL, HDL, LDLCALC, TRIG, CHOLHDL, LDLDIRECT in the last 72 hours. Thyroid Function Tests: No results for input(s): TSH, T4TOTAL, FREET4, T3FREE, THYROIDAB in the last 72 hours. Anemia Panel: No results for input(s): VITAMINB12, FOLATE, FERRITIN, TIBC, IRON, RETICCTPCT in the last 72 hours. Sepsis Labs: No results for input(s): PROCALCITON, LATICACIDVEN in the last 168 hours.  Recent Results (from the past 240 hour(s))  Resp Panel by RT-PCR (Flu A&B, Covid) Nasopharyngeal Swab      Status: None   Collection Time: 11/17/20  7:45 AM   Specimen: Nasopharyngeal Swab; Nasopharyngeal(NP) swabs in vial transport medium  Result Value Ref Range Status   SARS Coronavirus 2 by RT PCR NEGATIVE NEGATIVE Final    Comment: (NOTE) SARS-CoV-2 target nucleic acids are NOT DETECTED.  The SARS-CoV-2 RNA is generally detectable in upper respiratory specimens during the acute phase of infection. The lowest concentration of SARS-CoV-2 viral copies this assay can detect is 138 copies/mL. A negative result does not preclude SARS-Cov-2 infection and should not be used as the sole basis for treatment or other patient management decisions. A negative result may occur with  improper specimen collection/handling, submission of specimen other than nasopharyngeal swab, presence of viral mutation(s) within the areas targeted by this assay, and inadequate number of viral copies(<138 copies/mL). A negative result must be combined with clinical observations, patient history, and epidemiological information. The expected result is Negative.  Fact Sheet for Patients:  EntrepreneurPulse.com.au  Fact Sheet  for Healthcare Providers:  IncredibleEmployment.be  This test is no t yet approved or cleared by the [place] and  has been authorized for detection and/or diagnosis of SARS-CoV-2 by FDA under an Emergency Use Authorization (EUA). This EUA will remain  in effect (meaning this test can be used) for the duration of the COVID-19 declaration under Section 564(b)(1) of the Act, 21 U.S.C.section 360bbb-3(b)(1), unless the authorization is terminated  or revoked sooner.       Influenza A by PCR NEGATIVE NEGATIVE Final   Influenza B by PCR NEGATIVE NEGATIVE Final    Comment: (NOTE) The Xpert Xpress SARS-CoV-2/FLU/RSV plus assay is intended as an aid in the diagnosis of influenza from Nasopharyngeal swab specimens and should not be used as a sole basis  for treatment. Nasal washings and aspirates are unacceptable for Xpert Xpress SARS-CoV-2/FLU/RSV testing.  Fact Sheet for Patients: EntrepreneurPulse.com.au  Fact Sheet for Healthcare Providers: IncredibleEmployment.be  This test is not yet approved or cleared by the [place] FDA and has been authorized for detection and/or diagnosis of SARS-CoV-2 by FDA under an Emergency Use Authorization (EUA). This EUA will remain in effect (meaning this test can be used) for the duration of the COVID-19 declaration under Section 564(b)(1) of the Act, 21 U.S.C. section 360bbb-3(b)(1), unless the authorization is terminated or revoked.  Performed at [facility], [address] [place], [place] [address]   CULTURE, BLOOD (ROUTINE X 2) w Reflex to ID Panel     Status: None (Preliminary result)   Collection Time: 11/17/20 10:34 PM   Specimen: BLOOD  Result Value Ref Range Status   Specimen Description BLOOD LEFT FOREARM  Final   Special Requests   Final    BOTTLES DRAWN AEROBIC AND ANAEROBIC Blood Culture adequate volume   Culture   Final    NO GROWTH 3 DAYS Performed at [facility], [address] [place], [place] [address]    Report Status PENDING  Incomplete  CULTURE, BLOOD (ROUTINE X 2) w Reflex to ID Panel     Status: None (Preliminary result)   Collection Time: 11/17/20 10:35 PM   Specimen: BLOOD  Result Value Ref Range Status   Specimen Description BLOOD RIGHT FOREARM  Final   Special Requests   Final    BOTTLES DRAWN AEROBIC ONLY Blood Culture adequate volume   Culture   Final    NO GROWTH 3 DAYS Performed at [facility], [address], [place], [place] [address]    Report Status PENDING  Incomplete  Surgical pcr screen     Status: None   Collection Time: 11/19/20 12:41 PM   Specimen: Nasal Mucosa; Nasal Swab  Result Value Ref Range Status   MRSA, PCR NEGATIVE NEGATIVE Final   Staphylococcus aureus  NEGATIVE NEGATIVE Final    Comment: (NOTE) The Xpert SA Assay (FDA approved for NASAL specimens in patients 68 years of age and older), is one component of a comprehensive surveillance program. It is not intended to diagnose infection nor to guide or monitor treatment. Performed at [facility], [address] [place], [place] [address]          Radiology Studies: MR HEEL LEFT WO CONTRAST  Result Date: 11/18/2020 CLINICAL DATA:  Osteomyelitis, foot EXAM: MR OF THE LEFT HEEL WITHOUT CONTRAST TECHNIQUE: Multiplanar, multisequence MR imaging of the left heel was performed. No intravenous contrast was administered. COMPARISON:  Left foot radiograph 11/17/2020 FINDINGS: Bones/Joint/Cartilage There is bony edema and confluent low T1  signal within the posterior calcaneus. There is long segment intramedullary T2 hyperintense/T1 hypointense nonaggressive lesion within the partially visualized tibial diaphysis and metaphysis, could be developing bone infarct or marrow conversion changes. Ligaments No evidence of ligament tear. Muscles and Tendons There is distal Achilles tendinosis and peritendinitis with disruption of the tendon at its insertion on the calcaneus. There are intact fibers along the medial aspect inserting on the calcaneus. Soft tissues There is a posterior heel soft tissue ulcer. Generalized soft tissue swelling of the ankle. No visible abscess. IMPRESSION: Posterior heel soft tissue ulcer with underlying osteomyelitis of the posterior calcaneus. Distal Achilles tendinosis and peritendinitis with ulcer-related disruption/erosion through the tendon at its insertion on the calcaneus. Electronically Signed   By: Maurine Simmering M.D.   On: 11/18/2020 14:07        Scheduled Meds:  acetaminophen  1,000 mg Oral TID   atorvastatin  80 mg Oral QHS   enoxaparin (LOVENOX) injection  40 mg Subcutaneous Q24H   insulin aspart  0-15 Units Subcutaneous TID WC   insulin aspart  0-5 Units  Subcutaneous QHS   insulin glargine-yfgn  35 Units Subcutaneous QHS   metoprolol succinate  25 mg Oral Daily   oxybutynin  10 mg Oral QHS   pantoprazole  80 mg Oral QAC breakfast   polyethylene glycol  17 g Oral Daily   senna  1 tablet Oral Daily   vitamin B-12  1,000 mcg Oral Daily   Continuous Infusions:  sodium chloride Stopped (11/18/20 1318)   [START ON 11/21/2020] ceFEPime (MAXIPIME) IV     linezolid (ZYVOX) IV       LOS: 2 days    Time spent: 25 minutes    Desma Maxim, MD [facility]  If 7PM-7AM, please contact night-coverage 11/20/2020, 11:23 AM

## 2020-11-20 NOTE — TOC Progression Note (Signed)
Transition of Care [facility]) - Progression Note    Patient Details  Name: Andrea [name] MRN: [id number] Date of Birth: 1945/12/05  Transition of Care [facility]) CM/SW [place], RN Phone Number: 11/20/2020, 10:42 AM  Clinical Narrative:   Patient will be going to surgery today.  TOC will follow once patient returns for recommendations.  TOC contact information given, TOC to follow to discharge.         Expected Discharge Plan and Services                                                 Social Determinants of Health (SDOH) Interventions    Readmission Risk Interventions Readmission Risk Prevention Plan 08/21/2020  Transportation Screening Complete  PCP or Specialist Appt within 5-7 Days Complete  Home Care Screening Complete  Medication Review (RN CM) Complete  Some recent data might be hidden

## 2020-11-20 NOTE — Progress Notes (Signed)
PHARMACIST - PHYSICIAN COMMUNICATION  CONCERNING:  Enoxaparin (Lovenox) for DVT Prophylaxis    RECOMMENDATION: Patient was prescribed enoxaprin '40mg'$  q24 hours for VTE prophylaxis.   Filed Weights   11/17/20 1744  Weight: 62.6 kg (138 lb)    Body mass index is 26.95 kg/m.  Estimated Creatinine Clearance: 26.9 mL/min (A) (by C-G formula based on SCr of 1.49 mg/dL (H)).   Patient is candidate for enoxaparin '30mg'$  every 24 hours based on CrCl <40m/min   DESCRIPTION: [facility]  Patient is now receiving enoxaparin 30 mg every 24 hours    [name] PharmD, BCPS Clinical Pharmacist 11/20/2020 3:07 PM

## 2020-11-20 NOTE — Brief Op Note (Signed)
11/20/2020  6:00 PM  PATIENT:  Andrea [name]  75 y.o. female  PRE-OPERATIVE DIAGNOSIS:  Ulcer LT Heel. OM LT calcaneus  POST-OPERATIVE DIAGNOSIS:  Ulcer LT heel. OM LT calcaneus.  PROCEDURE:  Procedure(s): IRRIGATION AND DEBRIDEMENT FOOT-Heel Ulcer (Left) BONE BIOPSY (Left) Partial calcanectomy LT heel. Placement of negative pressure wound vac LT.   SURGEON:  Surgeon(s) and Role:    * Amalia Hailey, Dorathy Daft, DPM - Primary  PHYSICIAN ASSISTANT: None   ASSISTANTS: none   ANESTHESIA:   General  EBL:  167m   BLOOD ADMINISTERED:none  DRAINS: none   LOCAL MEDICATIONS USED:  NONE  SPECIMEN:  Source of Specimen:  Calcaneus LT  DISPOSITION OF SPECIMEN:  PATHOLOGY  COUNTS:  YES  TOURNIQUET:   Total Tourniquet Time Documented: Calf (Left) - 48 minutes Total: Calf (Left) - 48 minutes   DICTATION: .DViviann SpareDictation  PLAN OF CARE: Admit to inpatient   PATIENT DISPOSITION:  PACU - hemodynamically stable.   Delay start of Pharmacological VTE agent (>24hrs) due to surgical blood loss or risk of bleeding: not applicable

## 2020-11-20 NOTE — Anesthesia Preprocedure Evaluation (Addendum)
Anesthesia Evaluation  Patient identified by MRN, date of birth, ID band Patient awake    Reviewed: Allergy & Precautions, NPO status , Patient's Chart, lab work & pertinent test results  History of Anesthesia Complications Negative for: history of anesthetic complications  Airway Mallampati: III  TM Distance: <3 FB Neck ROM: limited    Dental  (+) Edentulous Upper   Pulmonary COPD, former smoker,    Pulmonary exam normal - rhonchi       Cardiovascular Exercise Tolerance: Poor hypertension, Pt. on medications (-) angina+ CAD, + Past MI and + Peripheral Vascular Disease  Normal cardiovascular exam+ dysrhythmias   ECHO 21: 1. Challenging images  2. Left ventricular ejection fraction, by grossly estimated at 40 to 45%  though difficult to visualize. The left ventricle has mildly decreased  function. The left ventricle demonstrates regional wall motion  abnormalities (images concerning for anterior  wall hypokinesis). Left ventricular diastolic parameters are consistent  with Grade I diastolic dysfunction (impaired relaxation).  3. Right ventricular systolic function is normal. The right ventricular  size is normal.  4. Valves not well visualized.    Neuro/Psych PSYCHIATRIC DISORDERS TIA Neuromuscular disease negative psych ROS   GI/Hepatic negative GI ROS, Neg liver ROS,   Endo/Other  diabetes, Type 2, Insulin Dependent  Renal/GU      Musculoskeletal  (+) Arthritis ,   Abdominal Normal abdominal exam  (+)   Peds  Hematology negative hematology ROS (+)   Anesthesia Other Findings Past Medical History: No date: Anxiety No date: Arthritis No date: CAD (coronary artery disease) No date: Chickenpox No date: Chronic systolic heart failure (HCC) No date: COPD (chronic obstructive pulmonary disease) (HCC)     Comment:  mild-no inhalers No date: Coronary artery disease 11/2019: CSF leak     Comment:  left  sinus No date: Depression No date: Diabetes mellitus type 2, uncomplicated (HCC) No date: Diabetic retinopathy ([place]) 2011: Dupuytren contracture     Comment:  s/p surgery to LEFT hand No date: Frequent urinary tract infections No date: GERD (gastroesophageal reflux disease) No date: Grade I diastolic dysfunction No date: HTN (hypertension) No date: Hyperlipidemia, unspecified No date: Irritable bowel syndrome 11/2019: Meningitis 1994: MI (myocardial infarction) ([place])     Comment:  no stent No date: Neuromuscular disorder ([place])     Comment:  nerve pain in back and legs No date: Osteoporosis 2021: Pneumonia No date: RBBB 11/2019: Sepsis ([place]) No date: Skin cancer 2014: TIA (transient ischemic attack) No date: Vitamin D deficiency, unspecified No date: Wheezing  Past Surgical History: No date: BACK SURGERY     Comment:  lower due to spinal stenosis 1994: CARDIAC CATHETERIZATION No date: CATARACT EXTRACTION, BILATERAL; Bilateral No date: COLONOSCOPY     Comment:  x3 02/08/2019: COLONOSCOPY WITH PROPOFOL; N/A     Comment:  Procedure: COLONOSCOPY WITH PROPOFOL;  Surgeon: [name], MD;  Location[facility] ENDOSCOPY;  Service:               Gastroenterology;  Laterality: N/A; 1994: CORONARY ANGIOPLASTY 2011: DUPUYTREN CONTRACTURE RELEASE; Left 11/2013: HIP FRACTURE SURGERY 08/21/2015: PR COLSC FLX W/REMOVAL LESION BY HOT BX FORCEPS      Comment:  Procedure: COLONOSCOPY, FLEXIBLE, PROXIMAL TO SPLENIC               FLEXURE; W/REMOVAL TUMOR/POLYP/OTHER LESION, HOT BX  FORCEP/CAUTE; Surgeon: Carlena Hurl, MD;               Location: OR [facility]; Service: General Surgery No date: REPAIR DURAL / CSF LEAK     Comment:  to left sinus 11/20/2019: TEE WITHOUT CARDIOVERSION; N/A     Comment:  Procedure: TRANSESOPHAGEAL ECHOCARDIOGRAM (TEE);                Surgeon: Nelva Bush, MD;  Location: [facility] ORS;                Service: Cardiovascular;   Laterality: N/A;  BMI    Body Mass Index: 32.25 kg/m      Reproductive/Obstetrics negative OB ROS                            Anesthesia Physical  Anesthesia Plan  ASA: 3  Anesthesia Plan: General ETT   Post-op Pain Management:    Induction: Intravenous  PONV Risk Score and Plan: 2 and Ondansetron and Treatment may vary due to age or medical condition  Airway Management Planned: Oral ETT  Additional Equipment:   Intra-op Plan:   Post-operative Plan: Extubation in OR  Informed Consent: I have reviewed the patients History and Physical, chart, labs and discussed the procedure including the risks, benefits and alternatives for the proposed anesthesia with the patient or authorized representative who has indicated his/her understanding and acceptance.     Dental Advisory Given  Plan Discussed with: Anesthesiologist, CRNA and Surgeon  Anesthesia Plan Comments: (Patient consented for risks of anesthesia including but not limited to:  - adverse reactions to medications - damage to eyes, teeth, lips or other oral mucosa - nerve damage due to positioning  - sore throat or hoarseness - Damage to heart, brain, nerves, lungs, other parts of body or loss of life  Patient voiced understanding.)       Anesthesia Quick Evaluation

## 2020-11-20 NOTE — Interval H&P Note (Signed)
History and Physical Interval Note:  11/20/2020 3:21 PM  Andrea [name]  has presented today for surgery, with the diagnosis of Debridement Left Foot Heel Ulcer.  The various methods of treatment have been discussed with the patient and family. After consideration of risks, benefits and other options for treatment, the patient has consented to  Procedure(s): IRRIGATION AND DEBRIDEMENT FOOT-Heel Ulcer (Left) BONE BIOPSY (Left) as a surgical intervention.  The patient's history has been reviewed, patient examined, no change in status, stable for surgery.  I have reviewed the patient's chart and labs.  Questions were answered to the patient's satisfaction.     Edrick Kins

## 2020-11-20 NOTE — Telephone Encounter (Signed)
   Telephone encounter was:  Successful.  11/20/2020 Name: [name] MRN: [id number] DOB: 12/16/1945  Andrea [name] is a 75 y.o. year old female who is a primary care patient of [name], FNP . The community resource team was consulted for assistance with Transportation Needs   Care guide performed the following interventions:  I called patient's phone regarding Transportation for 9/21 and 9/22. Per daughter joy, patient is still in the hospital. After looking deeper into account pt is expected to discharge on 9/17. I will reach back out to daughter on Monday 9/19 for an update.  Follow Up Plan:  Care guide will follow up with patient by phone over the next few days on 11/24/2020.  Vivian management  [place], [place]  Main Phone: [phone]  E-mail: [email or web address]  Website: [email or web address]

## 2020-11-20 NOTE — Transfer of Care (Signed)
Immediate Anesthesia Transfer of Care Note  Patient: Andrea [name]  Procedure(s) Performed: IRRIGATION AND DEBRIDEMENT FOOT-Heel Ulcer (Left: Heel) BONE BIOPSY (Left: Heel)  Patient Location: PACU  Anesthesia Type:General  Level of Consciousness: awake, drowsy and patient cooperative  Airway & Oxygen Therapy: Patient Spontanous Breathing and Patient connected to face mask oxygen  Post-op Assessment: Report given to RN and Post -op Vital signs reviewed and stable  Post vital signs: Reviewed and stable  Last Vitals:  Vitals Value Taken Time  BP 132/62 11/20/20 1755  Temp    Pulse 102 11/20/20 1759  Resp 19 11/20/20 1759  SpO2 100 % 11/20/20 1759  Vitals shown include unvalidated device data.  Last Pain:  Vitals:   11/20/20 1538  TempSrc: Oral  PainSc: 6          Complications: No notable events documented.

## 2020-11-20 NOTE — Anesthesia Postprocedure Evaluation (Signed)
Anesthesia Post Note  Patient: DAMAYAH KIGHTLINGER  Procedure(s) Performed: IRRIGATION AND DEBRIDEMENT FOOT-Heel Ulcer (Left: Heel) BONE BIOPSY (Left: Heel)  Patient location during evaluation: PACU Anesthesia Type: General Level of consciousness: awake and alert Pain management: pain level controlled Vital Signs Assessment: post-procedure vital signs reviewed and stable Respiratory status: spontaneous breathing, nonlabored ventilation, respiratory function stable and patient connected to nasal cannula oxygen Cardiovascular status: blood pressure returned to baseline and stable Postop Assessment: no apparent nausea or vomiting Anesthetic complications: no   No notable events documented.   Last Vitals:  Vitals:   11/20/20 1915 11/20/20 1918  BP: (!) 129/59   Pulse: 96 95  Resp: 11 10  Temp:    SpO2: 100% 98%    Last Pain:  Vitals:   11/20/20 1918  TempSrc:   PainSc: Asleep                 Precious Haws Addison Whidbee

## 2020-11-20 NOTE — Consult Note (Signed)
Pharmacy Antibiotic Note  Andrea [name] is a 75 y.o. female admitted on 11/17/2020 with  osteomyelitis .  Pharmacy has been consulted for cefepime and Vancomycin dosing.  Plan: Change Cefepime to 2 gram Q24H Vancomycin stopped and linezolid started per Scr 0.8>1.49 (aki)   Height: 5' (152.4 cm) Weight: 62.6 kg (138 lb) IBW/kg (Calculated) : 45.5  Temp (24hrs), Avg:97.9 F (36.6 C), Min:97.6 F (36.4 C), Max:98.3 F (36.8 C)  Recent Labs  Lab 11/17/20 1747 11/18/20 0503 11/20/20 0247  WBC 10.1 7.6 7.0  CREATININE 0.97 0.77 1.49*     Estimated Creatinine Clearance: 26.9 mL/min (A) (by C-G formula based on SCr of 1.49 mg/dL (H)).    Allergies  Allergen Reactions   Metformin And Related Diarrhea   Sulfa Antibiotics Rash    Antimicrobials this admission: 9/13 cefepime>>  9/13 vancomycin >> 9/14 9/15 linezolid >>  Dose adjustments this admission: N/a  Microbiology results: 9/13 BCx: NG x 3 d 9/13 MRSA PCR: sent, NEG  9/15 planned biopsy during surgery  Thank you for allowing pharmacy to be a part of this patient's care.  Lu Duffel, PharmD, BCPS Clinical Pharmacist   11/20/2020 11:39 AM

## 2020-11-20 NOTE — Anesthesia Procedure Notes (Signed)
Procedure Name: Intubation Date/Time: 11/20/2020 4:29 PM Performed by: Kelton Pillar, CRNA Pre-anesthesia Checklist: Patient identified, Emergency Drugs available, Suction available and Patient being monitored Patient Re-evaluated:Patient Re-evaluated prior to induction Oxygen Delivery Method: Circle system utilized Preoxygenation: Pre-oxygenation with 100% oxygen Induction Type: IV induction Ventilation: Mask ventilation without difficulty Tube type: Oral Number of attempts: 1 Airway Equipment and Method: Stylet and Oral airway Placement Confirmation: ETT inserted through vocal cords under direct vision, positive ETCO2 and breath sounds checked- equal and bilateral Secured at: 21 cm Tube secured with: Tape Dental Injury: Teeth and Oropharynx as per pre-operative assessment

## 2020-11-21 ENCOUNTER — Encounter: Payer: Self-pay | Admitting: Podiatry

## 2020-11-21 DIAGNOSIS — L97424 Non-pressure chronic ulcer of left heel and midfoot with necrosis of bone: Secondary | ICD-10-CM

## 2020-11-21 DIAGNOSIS — M86272 Subacute osteomyelitis, left ankle and foot: Secondary | ICD-10-CM | POA: Diagnosis not present

## 2020-11-21 LAB — BASIC METABOLIC PANEL
Anion gap: 3 — ABNORMAL LOW (ref 5–15)
BUN: 38 mg/dL — ABNORMAL HIGH (ref 8–23)
CO2: 19 mmol/L — ABNORMAL LOW (ref 22–32)
Calcium: 8.4 mg/dL — ABNORMAL LOW (ref 8.9–10.3)
Chloride: 113 mmol/L — ABNORMAL HIGH (ref 98–111)
Creatinine, Ser: 1.14 mg/dL — ABNORMAL HIGH (ref 0.44–1.00)
GFR, Estimated: 50 mL/min — ABNORMAL LOW (ref >60)
Glucose, Bld: 226 mg/dL — ABNORMAL HIGH (ref 70–99)
Potassium: 5 mmol/L (ref 3.5–5.1)
Sodium: 135 mmol/L (ref 135–145)

## 2020-11-21 LAB — CBC
HCT: 28.1 % — ABNORMAL LOW (ref 36.0–46.0)
Hemoglobin: 8.7 g/dL — ABNORMAL LOW (ref 12.0–15.0)
MCH: 27.5 pg (ref 26.0–34.0)
MCHC: 31 g/dL (ref 30.0–36.0)
MCV: 88.9 fL (ref 80.0–100.0)
Platelets: 175 10*3/uL (ref 150–400)
RBC: 3.16 MIL/uL — ABNORMAL LOW (ref 3.87–5.11)
RDW: 15.9 % — ABNORMAL HIGH (ref 11.5–15.5)
WBC: 8.7 10*3/uL (ref 4.0–10.5)
nRBC: 0 % (ref 0.0–0.2)

## 2020-11-21 LAB — GLUCOSE, CAPILLARY
Glucose-Capillary: 194 mg/dL — ABNORMAL HIGH (ref 70–99)
Glucose-Capillary: 320 mg/dL — ABNORMAL HIGH (ref 70–99)
Glucose-Capillary: 205 mg/dL — ABNORMAL HIGH (ref 70–99)
Glucose-Capillary: 164 mg/dL — ABNORMAL HIGH (ref 70–99)

## 2020-11-21 MED ORDER — ENOXAPARIN SODIUM 40 MG/0.4ML IJ SOSY
40.0000 mg | PREFILLED_SYRINGE | INTRAMUSCULAR | Status: DC
  Administered 2020-11-21 – 2020-12-01 (×11): 40 mg via SUBCUTANEOUS
  Filled 2020-11-21 (×11): qty 0.4

## 2020-11-21 MED ORDER — SODIUM CHLORIDE 0.9 % IV SOLN
2.0000 g | Freq: Two times a day (BID) | INTRAVENOUS | Status: DC
  Administered 2020-11-21 – 2020-12-01 (×21): 2 g via INTRAVENOUS
  Filled 2020-11-21 (×23): qty 2

## 2020-11-21 MED ORDER — HYDROMORPHONE HCL 1 MG/ML IJ SOLN
1.0000 mg | INTRAMUSCULAR | Status: DC | PRN
  Administered 2020-11-21 (×2): 1 mg via INTRAVENOUS
  Administered 2020-11-21: 2 mg via INTRAVENOUS
  Administered 2020-11-23 – 2020-11-24 (×2): 1 mg via INTRAVENOUS
  Filled 2020-11-21 (×4): qty 1
  Filled 2020-11-21: qty 2

## 2020-11-21 MED ORDER — ENOXAPARIN SODIUM 40 MG/0.4ML IJ SOSY: 40.0000 mg | PREFILLED_SYRINGE | INTRAMUSCULAR | Status: DC

## 2020-11-21 MED ORDER — INSULIN GLARGINE-YFGN 100 UNIT/ML ~~LOC~~ SOLN
45.0000 [IU] | Freq: Every day | SUBCUTANEOUS | Status: DC
  Administered 2020-11-21: 45 [IU] via SUBCUTANEOUS
  Filled 2020-11-21 (×2): qty 0.45

## 2020-11-21 NOTE — Consult Note (Signed)
NAME: Andrea [name]  DOB: 26-Jul-1945  MRN: [id number]  Date/Time: 11/21/2020 7:32 PM  REQUESTING PROVIDER: Dr.Wouk Subjective:  REASON FOR CONSULT: left heel wound history from daughter and patient- chart reviewed- pt known to me from admission in sept 2021 Andrea [name] is a 75 y.o. 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 She says the heel was cut by the boot she had on in the OR . She went to rehab and the wound did not heal. She was discharged home after 2 months- She was seeing Dr.Patel podiatrist as OP- she was given Doxy and keflex  .she saw her PCP on 11/13/20 for hypoglycemia and she noted ulcer on the heel- she got an xray which showed osteo and was asked to go to the ED . [name] Kitchen Vitals in the EDBP 142/82, Temp 98.2, HR 104, Pulse ox 99%. WBC 10.1, Hb 11.5, PLT 233, Cr 0.97 Blood culture was sent and was started on vanco and cefepime She was seen by podiatrist and underwent I/D of the ulcer, partial calcanectomy and wound vac Culture and pathology sent-  As cr up vanco was discontinued and changed to linezolid 9/13 cefepime>>  9/13 vancomycin >> 9/14 9/15 linezolid >>  I am asked to see her for long term antibiotic management   Medical history She underwent left hip hardware removal of prior fracture rod and had a THR anterior approach by Dr,menz on 08/19/20 and was sent to rehab In sept 2021 she was admitted with pneumococcal meningitis and found to have csf rhinorrhea- she was treated with Iv antibiotics and went to [facility] and  underwent endoscopic repair if csf leak due to skull base dehiscence on 12/06/19 and was discharged 3 days later- she has done well since then  Past Medical History:  Diagnosis Date   Anxiety    Arthritis    CAD (coronary artery disease)     Chickenpox    Chronic systolic heart failure ([place])    COPD (chronic obstructive pulmonary disease) ([place])    mild-no inhalers   Coronary artery disease    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   Depression    Diabetes mellitus type 2, uncomplicated ([place])    Diabetic retinopathy ([place])    Dupuytren contracture 2011   s/p surgery to LEFT hand   Frequent urinary tract infections    GERD (gastroesophageal reflux disease)    Grade I diastolic dysfunction    HTN (hypertension)    Hyperlipidemia, unspecified    Irritable bowel syndrome    Meningitis 11/2019   MI (myocardial infarction) ([place]) 1994   no stent   Neuromuscular disorder ([place])    nerve pain in back and legs   Osteoporosis    Pneumonia 2021   RBBB    Sepsis ([place]) 11/2019   Skin cancer    TIA (transient ischemic attack) 2014   Vitamin D deficiency, unspecified    Wheezing     Past Surgical History:  Procedure Laterality Date   BACK SURGERY     lower due to spinal stenosis   BONE BIOPSY Left 11/20/2020   Procedure: BONE BIOPSY;  Surgeon: Edrick Kins, DPM;  Location: [facility] ORS;  Service: Podiatry;  Laterality: Left;   [place]  CATARACT EXTRACTION, BILATERAL Bilateral    COLONOSCOPY     x3   COLONOSCOPY WITH PROPOFOL N/A 02/08/2019   Procedure: COLONOSCOPY WITH PROPOFOL;  Surgeon: [name], MD;  Location: [facility] ENDOSCOPY;  Service: Gastroenterology;  Laterality: N/A;   CORONARY ANGIOPLASTY  1994   DUPUYTREN CONTRACTURE RELEASE Left 2011   HARDWARE REMOVAL Left 08/19/2020   Procedure: HARDWARE REMOVAL;  Surgeon: Hessie Knows, MD;  Location: [facility] ORS;  Service: Orthopedics;  Laterality: Left;   HIP FRACTURE SURGERY  11/2013   IRRIGATION AND DEBRIDEMENT FOOT Left 11/20/2020   Procedure: IRRIGATION AND DEBRIDEMENT FOOT-Heel Ulcer;  Surgeon: Edrick Kins, DPM;  Location: [facility] ORS;  Service: Podiatry;  Laterality: Left;   PR COLSC FLX W/REMOVAL LESION BY HOT BX FORCEPS   08/21/2015   Procedure:  COLONOSCOPY, FLEXIBLE, PROXIMAL TO SPLENIC FLEXURE; W/REMOVAL TUMOR/POLYP/OTHER LESION, HOT BX FORCEP/CAUTE; Surgeon: Carlena Hurl, MD; Location: [facility] [place]; Service: General Surgery   REPAIR DURAL / CSF LEAK     to left sinus   TEE WITHOUT CARDIOVERSION N/A 11/20/2019   Procedure: TRANSESOPHAGEAL ECHOCARDIOGRAM (TEE);  Surgeon: Nelva Bush, MD;  Location: [facility] ORS;  Service: Cardiovascular;  Laterality: N/A;   TOTAL HIP ARTHROPLASTY Left 08/19/2020   Procedure: TOTAL HIP ARTHROPLASTY ANTERIOR APPROACH;  Surgeon: Hessie Knows, MD;  Location: [facility] ORS;  Service: Orthopedics;  Laterality: Left;    Social History   Socioeconomic History   Marital status: Married    Spouse name: Roselie Awkward   Number of children: 3   Years of education: 8   Highest education level: 8th grade  Occupational History   Occupation: retired  Tobacco Use   Smoking status: Former    Packs/day: 1.00    Years: 30.00    Pack years: 30.00    Types: Cigarettes    Quit date: 07/17/2012    Years since quitting: 8.3   Smokeless tobacco: Never   Tobacco comments:       Vaping Use   Vaping Use: Never used  Substance and Sexual Activity   Alcohol use: Not Currently    Alcohol/week: 0.0 standard drinks   Drug use: No   Sexual activity: Not Currently  Other Topics Concern   Not on file  Social History Narrative   Not on file   Social Determinants of Health   Financial Resource Strain: Not on file  Food Insecurity: No Food Insecurity   Worried About Charity fundraiser in the Last Year: Never true   Ran Out of Food in the Last Year: Never true  Transportation Needs: Unknown   Lack of Transportation (Medical): No   Lack of Transportation (Non-Medical): Not on file  Physical Activity: Not on file  Stress: Not on file  Social Connections: Not on file  Intimate Partner Violence: Not on file    Family History  Problem Relation Age of Onset   Depression Daughter    Arthritis Daughter         spine   Plantar fasciitis Daughter    Anxiety disorder Daughter    Hyperlipidemia Daughter    AAA (abdominal aortic aneurysm) Daughter    Hypertension Mother    Stroke Mother    Cataracts Mother    Depression Mother    Heart disease Mother    Asthma Brother    Diabetes Daughter    Hyperlipidemia Daughter    Hypertension Daughter    Breast cancer Cousin        maternal   Allergies  Allergen Reactions  Metformin And Related Diarrhea   Sulfa Antibiotics Rash   I? Current Facility-Administered Medications  Medication Dose Route Frequency Provider Last Rate Last Admin   0.9 %  sodium chloride infusion   Intravenous PRN Cox, Amy N, DO 10 mL/hr at 11/21/20 1804 Restarted at 11/21/20 1804   acetaminophen (TYLENOL) tablet 1,000 mg  1,000 mg Oral TID Ralene Muskrat B, MD   1,000 mg at 11/21/20 1632   atorvastatin (LIPITOR) tablet 80 mg  80 mg Oral QHS Cox, Amy N, DO   80 mg at 11/20/20 2028   ceFEPIme (MAXIPIME) 2 g in sodium chloride 0.9 % 100 mL IVPB  2 g Intravenous Q12H Lu Duffel, RPH 200 mL/hr at 11/21/20 1444 2 g at 11/21/20 1444   enoxaparin (LOVENOX) injection 40 mg  40 mg Subcutaneous Q24H Lu Duffel, RPH   40 mg at 11/21/20 0931   HYDROmorphone (DILAUDID) injection 1-2 mg  1-2 mg Intravenous Q3H PRN Gwynne Edinger, MD   1 mg at 11/21/20 1829   insulin aspart (novoLOG) injection 0-15 Units  0-15 Units Subcutaneous TID WC Cox, Amy N, DO   3 Units at 11/21/20 1631   insulin aspart (novoLOG) injection 0-5 Units  0-5 Units Subcutaneous QHS Cox, Amy N, DO   2 Units at 11/17/20 2308   insulin glargine-yfgn (SEMGLEE) injection 45 Units  45 Units Subcutaneous QHS Wouk, Ailene Rud, MD       linezolid (ZYVOX) IVPB 600 mg  600 mg Intravenous Q12H Wouk, Ailene Rud, MD   Stopped at 11/21/20 1040   metoprolol succinate (TOPROL-XL) 24 hr tablet 25 mg  25 mg Oral Daily Cox, Amy N, DO   25 mg at 11/21/20 0931   ondansetron (ZOFRAN) tablet 4 mg  4 mg Oral Q6H PRN  Cox, Amy N, DO       Or   ondansetron (ZOFRAN) injection 4 mg  4 mg Intravenous Q6H PRN Cox, Amy N, DO   4 mg at 11/20/20 1254   oxybutynin (DITROPAN-XL) 24 hr tablet 10 mg  10 mg Oral QHS Cox, Amy N, DO   10 mg at 11/20/20 2028   oxyCODONE (Oxy IR/ROXICODONE) immediate release tablet 5 mg  5 mg Oral Q4H PRN Ralene Muskrat B, MD   5 mg at 11/21/20 1633   pantoprazole (PROTONIX) EC tablet 80 mg  80 mg Oral QAC breakfast Cox, Amy N, DO   80 mg at 11/21/20 0830   polyethylene glycol (MIRALAX / GLYCOLAX) packet 17 g  17 g Oral Daily Gwynne Edinger, MD   17 g at 11/21/20 0940   senna (SENOKOT) tablet 8.6 mg  1 tablet Oral Daily Gwynne Edinger, MD   8.6 mg at 11/21/20 [id number]   vitamin B-12 (CYANOCOBALAMIN) tablet 1,000 mcg  1,000 mcg Oral Daily Cox, Amy N, DO   1,000 mcg at 11/21/20 0931     Abtx:  Anti-infectives (From admission, onward)    Start     Dose/Rate Route Frequency Ordered Stop   11/21/20 1400  ceFEPIme (MAXIPIME) 2 g in sodium chloride 0.9 % 100 mL IVPB        2 g 200 mL/hr over 30 Minutes Intravenous Every 12 hours 11/21/20 0730     11/21/20 0141  ceFEPIme (MAXIPIME) 2 g in sodium chloride 0.9 % 100 mL IVPB  Status:  Discontinued        2 g 200 mL/hr over 30 Minutes Intravenous Every 24 hours 11/20/20 0923 11/21/20 0730  11/20/20 1800  linezolid (ZYVOX) IVPB 600 mg        600 mg 300 mL/hr over 60 Minutes Intravenous Every 12 hours 11/20/20 0915     11/20/20 1702  vancomycin (VANCOCIN) powder  Status:  Discontinued          As needed 11/20/20 1702 11/20/20 1743   11/20/20 1700  tobramycin (NEBCIN) powder  Status:  Discontinued          As needed 11/20/20 1701 11/20/20 1743   11/19/20 2200  vancomycin (VANCOCIN) IVPB 1000 mg/200 mL premix  Status:  Discontinued        1,000 mg 200 mL/hr over 60 Minutes Intravenous Every 24 hours 11/19/20 0934 11/20/20 0915   11/19/20 0100  vancomycin (VANCOREADY) IVPB 750 mg/150 mL  Status:  Discontinued        750 mg 150 mL/hr over  60 Minutes Intravenous Every 24 hours 11/18/20 0028 11/19/20 0934   11/18/20 0200  ceFEPIme (MAXIPIME) 2 g in sodium chloride 0.9 % 100 mL IVPB  Status:  Discontinued        2 g 200 mL/hr over 30 Minutes Intravenous Every 12 hours 11/17/20 2347 11/20/20 0923   11/18/20 0100  vancomycin (VANCOREADY) IVPB 1500 mg/300 mL        1,500 mg 150 mL/hr over 120 Minutes Intravenous  Once 11/17/20 2347 11/18/20 0238       REVIEW OF SYSTEMS:  Const: negative fever, negative chills, ++ weight loss Eyes: negative diplopia or visual changes, negative eye pain ENT: negative coryza, negative sore throat Resp: negative cough, hemoptysis, dyspnea Cards: negative for chest pain, palpitations, lower extremity edema GU: negative for frequency, dysuria and hematuria GI: Negative for abdominal pain, diarrhea, bleeding, constipation Skin: negative for rash and pruritus Heme: negative for easy bruising and gum/nose bleeding MS: general weakness- has not been weight bearing Neurolo:negative for headaches, dizziness, vertigo, memory problems  Psych:pt is upset about the left heel ulcer Endocrine: diabetes Allergy/Immunology- metformin/sulfa Objective:  VITALS:  BP (!) 111/40 (BP Location: Left Arm)   Pulse 93   Temp 98.5 F (36.9 C) (Oral)   Resp 18   Ht 5' (1.524 m)   Wt 62.6 kg   SpO2 98%   BMI 26.95 kg/m  PHYSICAL EXAM:  General: Alert, cooperative, no distress, appears stated age.  Head: Normocephalic, without obvious abnormality, atraumatic. Eyes: Conjunctivae clear, anicteric sclerae. Pupils are equal ENT Nares normal. No drainage or sinus tenderness. Lips, mucosa, and tongue normal. No Thrush Neck: Supple, symmetrical, no adenopathy, thyroid: non tender no carotid bruit and no JVD. Back: No CVA tenderness. Lungs: Clear to auscultation bilaterally. No Wheezing or Rhonchi. No rales. Heart: Regular rate and rhythm, no murmur, rub or gallop. Abdomen: Soft, non-tender,not distended. Bowel  sounds normal. No masses Extremities:  Before surgery picture reviewed  She now has wound vac Skin: No rashes or lesions. Or bruising Lymph: Cervical, supraclavicular normal. Neurologic: Grossly non-focal Pertinent Labs Lab Results CBC    Component Value Date/Time   WBC 8.7 11/21/2020 0420   RBC 3.16 (L) 11/21/2020 0420   HGB 8.7 (L) 11/21/2020 0420   HGB 11.9 02/11/2020 1441   HCT 28.1 (L) 11/21/2020 0420   HCT 37.0 02/11/2020 1441   PLT 175 11/21/2020 0420   PLT 226 02/11/2020 1441   MCV 88.9 11/21/2020 0420   MCV 90 02/11/2020 1441   MCV 91 01/01/2014 0643   MCH 27.5 11/21/2020 0420   MCHC 31.0 11/21/2020 0420   RDW 15.9 (H)  11/21/2020 0420   RDW 13.8 02/11/2020 1441   RDW 16.5 (H) 01/01/2014 0643   LYMPHSABS 2.2 08/12/2020 1215   LYMPHSABS 2.9 02/11/2020 1441   LYMPHSABS 1.2 01/01/2014 0643   MONOABS 0.4 08/12/2020 1215   MONOABS 0.6 01/01/2014 0643   EOSABS 0.2 08/12/2020 1215   EOSABS 0.2 02/11/2020 1441   EOSABS 1.1 (H) 01/01/2014 0643   BASOSABS 0.0 08/12/2020 1215   BASOSABS 0.0 02/11/2020 1441   BASOSABS 0.0 01/01/2014 0643    CMP Latest Ref Rng & Units 11/21/2020 11/20/2020 11/18/2020  Glucose 70 - 99 mg/dL 226(H) 170(H) 131(H)  BUN 8 - 23 mg/dL 38(H) 33(H) 34(H)  Creatinine 0.44 - 1.00 mg/dL 1.14(H) 1.49(H) 0.77  Sodium 135 - 145 mmol/L 135 136 138  Potassium 3.5 - 5.1 mmol/L 5.0 4.7 4.3  Chloride 98 - 111 mmol/L 113(H) 109 107  CO2 22 - 32 mmol/L 19(L) 21(L) 25  Calcium 8.9 - 10.3 mg/dL 8.4(L) 8.3(L) 8.5(L)  Total Protein 6.5 - 8.1 g/dL - - -  Total Bilirubin 0.3 - 1.2 mg/dL - - -  Alkaline Phos 38 - 126 U/L - - -  AST 15 - 41 U/L - - -  ALT 0 - 44 U/L - - -      Microbiology: Recent Results (from the past 240 hour(s))  Resp Panel by RT-PCR (Flu A&B, Covid) Nasopharyngeal Swab     Status: None   Collection Time: 11/17/20  7:45 AM   Specimen: Nasopharyngeal Swab; Nasopharyngeal(NP) swabs in vial transport medium  Result Value Ref Range Status    SARS Coronavirus 2 by RT PCR NEGATIVE NEGATIVE Final    Comment: (NOTE) SARS-CoV-2 target nucleic acids are NOT DETECTED.  The SARS-CoV-2 RNA is generally detectable in upper respiratory specimens during the acute phase of infection. The lowest concentration of SARS-CoV-2 viral copies this assay can detect is 138 copies/mL. A negative result does not preclude SARS-Cov-2 infection and should not be used as the sole basis for treatment or other patient management decisions. A negative result may occur with  improper specimen collection/handling, submission of specimen other than nasopharyngeal swab, presence of viral mutation(s) within the areas targeted by this assay, and inadequate number of viral copies(<138 copies/mL). A negative result must be combined with clinical observations, patient history, and epidemiological information. The expected result is Negative.  Fact Sheet for Patients:  EntrepreneurPulse.com.au  Fact Sheet for Healthcare Providers:  IncredibleEmployment.be  This test is no t yet approved or cleared by the [place] FDA and  has been authorized for detection and/or diagnosis of SARS-CoV-2 by FDA under an Emergency Use Authorization (EUA). This EUA will remain  in effect (meaning this test can be used) for the duration of the COVID-19 declaration under Section 564(b)(1) of the Act, 21 U.S.C.section 360bbb-3(b)(1), unless the authorization is terminated  or revoked sooner.       Influenza A by PCR NEGATIVE NEGATIVE Final   Influenza B by PCR NEGATIVE NEGATIVE Final    Comment: (NOTE) The Xpert Xpress SARS-CoV-2/FLU/RSV plus assay is intended as an aid in the diagnosis of influenza from Nasopharyngeal swab specimens and should not be used as a sole basis for treatment. Nasal washings and aspirates are unacceptable for Xpert Xpress SARS-CoV-2/FLU/RSV testing.  Fact Sheet for  Patients: EntrepreneurPulse.com.au  Fact Sheet for Healthcare Providers: IncredibleEmployment.be  This test is not yet approved or cleared by the [place] FDA and has been authorized for detection and/or diagnosis of SARS-CoV-2 by FDA under an Emergency  Use Authorization (EUA). This EUA will remain in effect (meaning this test can be used) for the duration of the COVID-19 declaration under Section 564(b)(1) of the Act, 21 U.S.C. section 360bbb-3(b)(1), unless the authorization is terminated or revoked.  Performed at [facility], [address] [place], [place] [address]   CULTURE, BLOOD (ROUTINE X 2) w Reflex to ID Panel     Status: None (Preliminary result)   Collection Time: 11/17/20 10:34 PM   Specimen: BLOOD  Result Value Ref Range Status   Specimen Description BLOOD LEFT FOREARM  Final   Special Requests   Final    BOTTLES DRAWN AEROBIC AND ANAEROBIC Blood Culture adequate volume   Culture   Final    NO GROWTH 4 DAYS Performed at [facility], [address], [place], [place] [address]    Report Status PENDING  Incomplete  CULTURE, BLOOD (ROUTINE X 2) w Reflex to ID Panel     Status: None (Preliminary result)   Collection Time: 11/17/20 10:35 PM   Specimen: BLOOD  Result Value Ref Range Status   Specimen Description BLOOD RIGHT FOREARM  Final   Special Requests   Final    BOTTLES DRAWN AEROBIC ONLY Blood Culture adequate volume   Culture   Final    NO GROWTH 4 DAYS Performed at [facility], [address], [place], [place] [address]    Report Status PENDING  Incomplete  Surgical pcr screen     Status: None   Collection Time: 11/19/20 12:41 PM   Specimen: Nasal Mucosa; Nasal Swab  Result Value Ref Range Status   MRSA, PCR NEGATIVE NEGATIVE Final   Staphylococcus aureus NEGATIVE NEGATIVE Final    Comment: (NOTE) The Xpert SA Assay (FDA approved for NASAL specimens in patients 30 years of age  and older), is one component of a comprehensive surveillance program. It is not intended to diagnose infection nor to guide or monitor treatment. Performed at [facility], [place], [place] [address]   Aerobic/Anaerobic Culture w Gram Stain (surgical/deep wound)     Status: None (Preliminary result)   Collection Time: 11/20/20  5:08 PM   Specimen: PATH Bone biopsy; Tissue  Result Value Ref Range Status   Specimen Description   Final    HEEL Performed at [facility], [address] [place], [place] [address]    Special Requests   Final    LEFT HEEL BONE CULTURE Performed at [facility], [place], [place] [address]    Gram Stain NO WBC SEEN RARE GRAM NEGATIVE RODS   Final   Culture   Final    NO GROWTH < 12 HOURS Performed at [facility], [place] [address] [place], [place] [address]    Report Status PENDING  Incomplete  Aerobic/Anaerobic Culture w Gram Stain (surgical/deep wound)     Status: None (Preliminary result)   Collection Time: 11/20/20  5:08 PM   Specimen: PATH Bone biopsy; Tissue  Result Value Ref Range Status   Specimen Description   Final    WOUND Performed at [facility], [address], [place], [place] [address]    Special Requests   Final    LEFT HEEL WD CULTURE Performed at [facility], [address] [place], [place] [address]    Gram Stain   Final    NO WBC SEEN NO ORGANISMS SEEN Performed at [facility], [place] [address] [place], [place]  [id number]    Culture PENDING  Incomplete   Report Status PENDING  Incomplete    IMAGING RESULTS: 11/13/20  I have personally reviewed the films  11/20/20  ? 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 ? ___________________________________________________ Discussed with patient,and her daughter ID will follow her peripherally this weekend- call if needed Note:  This document was prepared using Dragon voice recognition software and may include unintentional dictation errors.

## 2020-11-21 NOTE — TOC Progression Note (Signed)
Transition of Care [facility]) - Progression Note    Patient Details  Name: Andrea [name] MRN: [id number] Date of Birth: Jan 29, 1946  Transition of Care [facility]) CM/SW [place], RN Phone Number: 11/21/2020, 8:52 AM  Clinical Narrative:   This patient is open with [facility] for PT, OT, and Nursing, PT and OT to eval today for recommendations, TOC to follow afterwards for needs, Will need resumption of care orders if to go home with Home health         Expected Discharge Plan and Services                                                 Social Determinants of Health (SDOH) Interventions    Readmission Risk Interventions Readmission Risk Prevention Plan 08/21/2020  Transportation Screening Complete  PCP or Specialist Appt within 5-7 Days Complete  Home Care Screening Complete  Medication Review (RN CM) Complete  Some recent data might be hidden

## 2020-11-21 NOTE — Consult Note (Signed)
Pharmacy Antibiotic Note  Andrea [name] is a 75 y.o. female admitted on 11/17/2020 with  osteomyelitis .  Pharmacy has been consulted for cefepime and Vancomycin dosing.  Plan: Change Cefepime back to 2 gram Q12H Scr 0.8>1.49 (aki) > 1.14 (after vanc/ketorolac stopped)   Height: 5' (152.4 cm) Weight: 62.6 kg (138 lb 0.1 oz) IBW/kg (Calculated) : 45.5  Temp (24hrs), Avg:97.6 F (36.4 C), Min:96.8 F (36 C), Max:98.3 F (36.8 C)  Recent Labs  Lab 11/17/20 1747 11/18/20 0503 11/20/20 0247 11/21/20 0420  WBC 10.1 7.6 7.0 8.7  CREATININE 0.97 0.77 1.49* 1.14*     Estimated Creatinine Clearance: 35.2 mL/min (A) (by C-G formula based on SCr of 1.14 mg/dL (H)).    Allergies  Allergen Reactions   Metformin And Related Diarrhea   Sulfa Antibiotics Rash    Antimicrobials this admission: 9/13 cefepime>>  9/13 vancomycin >> 9/14 9/15 linezolid >>  Dose adjustments this admission: N/a  Microbiology results: 9/13 BCx: NG x 3 d 9/13 MRSA PCR: sent, NEG  9/15 Left Heel bone cx rare GNR  Thank you for allowing pharmacy to be a part of this patient's care.  Lu Duffel, PharmD, BCPS Clinical Pharmacist   11/21/2020 7:38 AM

## 2020-11-21 NOTE — Progress Notes (Addendum)
[name] PROGRESS NOTE    Andrea [name]  [id number] DOB: February 22, 1946 DOA: 11/17/2020 PCP: [name], FNP   Brief Narrative:  75 y.o. female with medical history significant for insulin-dependent diabetes mellitus, hypertension, hyperlipidemia, GERD, history of meningitis, CAD, COPD, history of TIA, grade 1 diastolic dysfunction, depression, anxiety, osteoarthritis, who presents to the emergency department from home at the request of her PCP for chief concerns of osteomyelitis of the left heel.   She states that the wound has been there for 3 months and she has taken antibiotics in it and has not resolved.  She states that this started after her orthopedic boot was taken off and she thinks that there might have been a scratch.  She states that the wound has progressively worsened prompting her to present to her PCP.  Her PCP ordered a foot x-ray on 11/13/2020 and resulted today.  She endorses inability to bear weight due to the pain.   Assessment & Plan:   Principal Problem:   Osteomyelitis ([place]) Active Problems:   Chronic obstructive pulmonary disease (HCC)   HLD (hyperlipidemia)   Coronary artery disease involving native coronary artery   PAD (peripheral artery disease) (HCC)   History of meningitis   Status post total hip replacement, left   Cellulitis of left lower extremity  Left heel osteomyelitis No sepsis. Recent ABIs wnl b/l. S/p debridement in the OR w/ podiatry on 9/15. Treated here w/ linezolid and cefepime (switched to linezolid from vanc 2/2 aki). - f/u cultures - ID consult for assistance w/ abx choice and duration - pt/ot consulted, pt interested in snf if qualifies - wound vac in place, will need changed 2x weekly  Acute kidney injury Poss 2/2 vanc, discontinued 9/15. Also holding nsaid and arb. Improving, cr 1.14 today - monitor  Insulin-dependent diabetes mellitus Glucose elevated today - increase lantus to 45, continue SSI    Hypertension Well  controlled Stopped losartan as above - PTA Toprol-XL 25 mg daily  Hyperlipidemia - PTA Lipitor 80 mg daily  Overactive bladder - PTA oxybutynin 20 mg nightly  GERD - PPI   History of meningitis and CSF leak No acute issues  DVT prophylaxis: SQ Lovenox Code Status: Full Family Communication: daughter joy updated telephonically 9/16 Disposition Plan:Status is: Observation  The patient will require care spanning > 2 midnights and should be moved to inpatient because: Inpatient level of care appropriate due to severity of illness  Dispo: The patient is from: Home              Anticipated d/c is to: SNF              Patient currently is not medically stable to d/c.   Difficult to place patient No    Level of care: Med-Surg  Consultants:  Podiatry-Dr. Amalia Hailey  Procedures:  Debridement in OR 9/15  Antimicrobials:  linezolid Cefepime   Subjective: Seen and examined.  Says pain much improved from this morning. No chest pain, no DOE.   Objective: Vitals:   11/20/20 1918 11/20/20 1930 11/21/20 0856 11/21/20 1204  BP:  (!) 121/57 123/65 (!) 113/47  Pulse: 95 95 88 (!) 103  Resp: '10 18 18 18  '$ Temp:  (!) 96.8 F (36 C) 98.9 F (37.2 C) 98.4 F (36.9 C)  TempSrc:      SpO2: 98% 100% 97% 95%  Weight:      Height:        Intake/Output Summary (Last 24 hours) at 11/21/2020 1346  Last data filed at 11/21/2020 1025 Gross per 24 hour  Intake 2395.79 ml  Output --  Net 2395.79 ml   Filed Weights   11/17/20 1744 11/20/20 1538  Weight: 62.6 kg 62.6 kg    Examination:  General exam: Appears calm and comfortable  Respiratory system: Lungs clear.  Normal work of breathing.  Room air Cardiovascular system: S1-S2, RRR, no murmurs, no pedal edema Gastrointestinal system: Abdomen is nondistended, soft and nontender. No organomegaly or masses felt. Normal bowel sounds heard. Central nervous system: Alert and oriented. No focal neurological deficits. Extremities: Left heel  full-thickness ulcer with surrounding edema and serosanguineous drainage Skin: lft heel bandaged Psychiatry: Judgement and insight appear normal. Mood & affect appropriate.     Data Reviewed: I have personally reviewed following labs and imaging studies  CBC: Recent Labs  Lab 11/17/20 1747 11/18/20 0503 11/20/20 0247 11/21/20 0420  WBC 10.1 7.6 7.0 8.7  HGB 11.5* 9.8* 9.6* 8.7*  HCT 34.9* 30.4* 30.5* 28.1*  MCV 87.3 88.1 88.7 88.9  PLT 233 200 183 0000000   Basic Metabolic Panel: Recent Labs  Lab 11/17/20 1747 11/18/20 0503 11/20/20 0247 11/21/20 0420  NA 138 138 136 135  K 4.3 4.3 4.7 5.0  CL 104 107 109 113*  CO2 29 25 21* 19*  GLUCOSE 123* 131* 170* 226*  BUN 33* 34* 33* 38*  CREATININE 0.97 0.77 1.49* 1.14*  CALCIUM 9.1 8.5* 8.3* 8.4*   GFR: Estimated Creatinine Clearance: 35.2 mL/min (A) (by C-G formula based on SCr of 1.14 mg/dL (H)). Liver Function Tests: No results for input(s): AST, ALT, ALKPHOS, BILITOT, PROT, ALBUMIN in the last 168 hours. No results for input(s): LIPASE, AMYLASE in the last 168 hours. No results for input(s): AMMONIA in the last 168 hours. Coagulation Profile: No results for input(s): INR, PROTIME in the last 168 hours. Cardiac Enzymes: No results for input(s): CKTOTAL, CKMB, CKMBINDEX, TROPONINI in the last 168 hours. BNP (last 3 results) No results for input(s): PROBNP in the last 8760 hours. HbA1C: No results for input(s): HGBA1C in the last 72 hours.  CBG: Recent Labs  Lab 11/20/20 1537 11/20/20 1757 11/20/20 2202 11/21/20 0857 11/21/20 1241  GLUCAP 85 89 167* 194* 320*   Lipid Profile: No results for input(s): CHOL, HDL, LDLCALC, TRIG, CHOLHDL, LDLDIRECT in the last 72 hours. Thyroid Function Tests: No results for input(s): TSH, T4TOTAL, FREET4, T3FREE, THYROIDAB in the last 72 hours. Anemia Panel: No results for input(s): VITAMINB12, FOLATE, FERRITIN, TIBC, IRON, RETICCTPCT in the last 72 hours. Sepsis Labs: No  results for input(s): PROCALCITON, LATICACIDVEN in the last 168 hours.  Recent Results (from the past 240 hour(s))  Resp Panel by RT-PCR (Flu A&B, Covid) Nasopharyngeal Swab     Status: None   Collection Time: 11/17/20  7:45 AM   Specimen: Nasopharyngeal Swab; Nasopharyngeal(NP) swabs in vial transport medium  Result Value Ref Range Status   SARS Coronavirus 2 by RT PCR NEGATIVE NEGATIVE Final    Comment: (NOTE) SARS-CoV-2 target nucleic acids are NOT DETECTED.  The SARS-CoV-2 RNA is generally detectable in upper respiratory specimens during the acute phase of infection. The lowest concentration of SARS-CoV-2 viral copies this assay can detect is 138 copies/mL. A negative result does not preclude SARS-Cov-2 infection and should not be used as the sole basis for treatment or other patient management decisions. A negative result may occur with  improper specimen collection/handling, submission of specimen other than nasopharyngeal swab, presence of viral mutation(s) within the areas targeted  by this assay, and inadequate number of viral copies(<138 copies/mL). A negative result must be combined with clinical observations, patient history, and epidemiological information. The expected result is Negative.  Fact Sheet for Patients:  EntrepreneurPulse.com.au  Fact Sheet for Healthcare Providers:  IncredibleEmployment.be  This test is no t yet approved or cleared by the [place] FDA and  has been authorized for detection and/or diagnosis of SARS-CoV-2 by FDA under an Emergency Use Authorization (EUA). This EUA will remain  in effect (meaning this test can be used) for the duration of the COVID-19 declaration under Section 564(b)(1) of the Act, 21 U.S.C.section 360bbb-3(b)(1), unless the authorization is terminated  or revoked sooner.       Influenza A by PCR NEGATIVE NEGATIVE Final   Influenza B by PCR NEGATIVE NEGATIVE Final    Comment:  (NOTE) The Xpert Xpress SARS-CoV-2/FLU/RSV plus assay is intended as an aid in the diagnosis of influenza from Nasopharyngeal swab specimens and should not be used as a sole basis for treatment. Nasal washings and aspirates are unacceptable for Xpert Xpress SARS-CoV-2/FLU/RSV testing.  Fact Sheet for Patients: EntrepreneurPulse.com.au  Fact Sheet for Healthcare Providers: IncredibleEmployment.be  This test is not yet approved or cleared by the [place] FDA and has been authorized for detection and/or diagnosis of SARS-CoV-2 by FDA under an Emergency Use Authorization (EUA). This EUA will remain in effect (meaning this test can be used) for the duration of the COVID-19 declaration under Section 564(b)(1) of the Act, 21 U.S.C. section 360bbb-3(b)(1), unless the authorization is terminated or revoked.  Performed at [facility], [address] [place], [place] [address]   CULTURE, BLOOD (ROUTINE X 2) w Reflex to ID Panel     Status: None (Preliminary result)   Collection Time: 11/17/20 10:34 PM   Specimen: BLOOD  Result Value Ref Range Status   Specimen Description BLOOD LEFT FOREARM  Final   Special Requests   Final    BOTTLES DRAWN AEROBIC AND ANAEROBIC Blood Culture adequate volume   Culture   Final    NO GROWTH 4 DAYS Performed at [facility], [address], [place], [place] [address]    Report Status PENDING  Incomplete  CULTURE, BLOOD (ROUTINE X 2) w Reflex to ID Panel     Status: None (Preliminary result)   Collection Time: 11/17/20 10:35 PM   Specimen: BLOOD  Result Value Ref Range Status   Specimen Description BLOOD RIGHT FOREARM  Final   Special Requests   Final    BOTTLES DRAWN AEROBIC ONLY Blood Culture adequate volume   Culture   Final    NO GROWTH 4 DAYS Performed at [facility], [address] [place], [place] [address]    Report Status PENDING  Incomplete  Surgical pcr screen      Status: None   Collection Time: 11/19/20 12:41 PM   Specimen: Nasal Mucosa; Nasal Swab  Result Value Ref Range Status   MRSA, PCR NEGATIVE NEGATIVE Final   Staphylococcus aureus NEGATIVE NEGATIVE Final    Comment: (NOTE) The Xpert SA Assay (FDA approved for NASAL specimens in patients 60 years of age and older), is one component of a comprehensive surveillance program. It is not intended to diagnose infection nor to guide or monitor treatment. Performed at [facility], [address] [place], [place] [address]   Aerobic/Anaerobic Culture w Gram Stain (surgical/deep wound)     Status: None (Preliminary result)   Collection Time: 11/20/20  5:08 PM  Specimen: PATH Bone biopsy; Tissue  Result Value Ref Range Status   Specimen Description   Final    HEEL Performed at [facility], [address] [place], [place] [address]    Special Requests   Final    LEFT HEEL BONE CULTURE Performed at [facility], [place], [place] [address]    Gram Stain NO WBC SEEN RARE GRAM NEGATIVE RODS   Final   Culture   Final    NO GROWTH < 12 HOURS Performed at [facility], [place] [address] [place], [place] [address]    Report Status PENDING  Incomplete  Aerobic/Anaerobic Culture w Gram Stain (surgical/deep wound)     Status: None (Preliminary result)   Collection Time: 11/20/20  5:08 PM   Specimen: PATH Bone biopsy; Tissue  Result Value Ref Range Status   Specimen Description   Final    WOUND Performed at [facility], [address] [place], [place] [address]    Special Requests   Final    LEFT HEEL WD CULTURE Performed at [facility], [address] [place], [place] [address]    Gram Stain   Final    NO WBC SEEN NO ORGANISMS SEEN Performed at [facility], [place] [address] [place], [place] [address]    Culture PENDING  Incomplete   Report Status PENDING  Incomplete         Radiology Studies: DG Foot  Complete Left  Result Date: 11/20/2020 CLINICAL DATA:  Postop EXAM: LEFT FOOT - COMPLETE 3+ VIEW COMPARISON:  11/17/2020 FINDINGS: Frontal, oblique, lateral views of the left foot are obtained. Postsurgical changes are seen from partial resection of the dorsal aspect of the calcaneus, with likely impregnated antibiotic beads at the surgical site. Overlying surgical drain. Bones are diffusely osteopenic. Chronic degenerative changes of the metatarsophalangeal and interphalangeal joints, with hammertoe deformities again noted. IMPRESSION: 1. Postoperative changes of the calcaneus as above. 2. Diffuse osteopenia and osteoarthritis. Electronically Signed   By: Randa Ngo M.D.   On: 11/20/2020 20:24        Scheduled Meds:  acetaminophen  1,000 mg Oral TID   atorvastatin  80 mg Oral QHS   enoxaparin (LOVENOX) injection  40 mg Subcutaneous Q24H   insulin aspart  0-15 Units Subcutaneous TID WC   insulin aspart  0-5 Units Subcutaneous QHS   insulin glargine-yfgn  45 Units Subcutaneous QHS   metoprolol succinate  25 mg Oral Daily   oxybutynin  10 mg Oral QHS   pantoprazole  80 mg Oral QAC breakfast   polyethylene glycol  17 g Oral Daily   senna  1 tablet Oral Daily   vitamin B-12  1,000 mcg Oral Daily   Continuous Infusions:  sodium chloride Stopped (11/18/20 1318)   sodium chloride 100 mL/hr at 11/21/20 0939   ceFEPime (MAXIPIME) IV     linezolid (ZYVOX) IV 600 mg (11/21/20 0939)     LOS: 3 days    Time spent: 20 minutes    Desma Maxim, MD [facility]  If 7PM-7AM, please contact night-coverage 11/21/2020, 1:46 PM

## 2020-11-21 NOTE — Care Management Important Message (Signed)
Important Message  Patient Details  Name: Andrea [name] MRN: [id number] Date of Birth: 04-Oct-1945   Medicare Important Message Given:  Yes     [name] 11/21/2020, 12:01 PM

## 2020-11-21 NOTE — Progress Notes (Signed)
PHARMACIST - PHYSICIAN COMMUNICATION  CONCERNING:  Enoxaparin (Lovenox) for DVT Prophylaxis    RECOMMENDATION: Patient was prescribed enoxaprin '30mg'$  q24 hours for VTE prophylaxis.   Filed Weights   11/17/20 1744 11/20/20 1538  Weight: 62.6 kg (138 lb) 62.6 kg (138 lb 0.1 oz)    Body mass index is 26.95 kg/m.  Estimated Creatinine Clearance: 35.2 mL/min (A) (by C-G formula based on SCr of 1.14 mg/dL (H)).   Patient is candidate for enoxaparin '40mg'$  every 24 hours based on CrCl >59m/min   DESCRIPTION: Pharm[facility]  Patient is now receiving enoxaparin 40 mg every 24 hours    [name] PharmD, BCPS Clinical Pharmacist 11/21/2020 7:37 AM

## 2020-11-22 DIAGNOSIS — M86272 Subacute osteomyelitis, left ankle and foot: Secondary | ICD-10-CM | POA: Diagnosis not present

## 2020-11-22 LAB — BASIC METABOLIC PANEL
Anion gap: 3 — ABNORMAL LOW (ref 5–15)
BUN: 34 mg/dL — ABNORMAL HIGH (ref 8–23)
CO2: 21 mmol/L — ABNORMAL LOW (ref 22–32)
Calcium: 8.8 mg/dL — ABNORMAL LOW (ref 8.9–10.3)
Chloride: 109 mmol/L (ref 98–111)
Creatinine, Ser: 1.19 mg/dL — ABNORMAL HIGH (ref 0.44–1.00)
GFR, Estimated: 48 mL/min — ABNORMAL LOW (ref >60)
Glucose, Bld: 209 mg/dL — ABNORMAL HIGH (ref 70–99)
Potassium: 5 mmol/L (ref 3.5–5.1)
Sodium: 133 mmol/L — ABNORMAL LOW (ref 135–145)

## 2020-11-22 LAB — CBC
HCT: 26.8 % — ABNORMAL LOW (ref 36.0–46.0)
Hemoglobin: 8.2 g/dL — ABNORMAL LOW (ref 12.0–15.0)
MCH: 27.9 pg (ref 26.0–34.0)
MCHC: 30.6 g/dL (ref 30.0–36.0)
MCV: 91.2 fL (ref 80.0–100.0)
Platelets: 174 10*3/uL (ref 150–400)
RBC: 2.94 MIL/uL — ABNORMAL LOW (ref 3.87–5.11)
RDW: 16.2 % — ABNORMAL HIGH (ref 11.5–15.5)
WBC: 10 10*3/uL (ref 4.0–10.5)
nRBC: 0 % (ref 0.0–0.2)

## 2020-11-22 LAB — CULTURE, BLOOD (ROUTINE X 2)
Culture: NO GROWTH
Culture: NO GROWTH
Special Requests: ADEQUATE
Special Requests: ADEQUATE

## 2020-11-22 LAB — GLUCOSE, CAPILLARY
Glucose-Capillary: 143 mg/dL — ABNORMAL HIGH (ref 70–99)
Glucose-Capillary: 190 mg/dL — ABNORMAL HIGH (ref 70–99)
Glucose-Capillary: 189 mg/dL — ABNORMAL HIGH (ref 70–99)
Glucose-Capillary: 142 mg/dL — ABNORMAL HIGH (ref 70–99)

## 2020-11-22 MED ORDER — GLYCERIN (LAXATIVE) 2 G RE SUPP
1.0000 | Freq: Every day | RECTAL | Status: DC | PRN
  Filled 2020-11-22 (×2): qty 1

## 2020-11-22 MED ORDER — INSULIN GLARGINE-YFGN 100 UNIT/ML ~~LOC~~ SOLN
50.0000 [IU] | Freq: Every day | SUBCUTANEOUS | Status: DC
  Administered 2020-11-22: 50 [IU] via SUBCUTANEOUS
  Filled 2020-11-22 (×2): qty 0.5

## 2020-11-22 NOTE — Plan of Care (Signed)

## 2020-11-22 NOTE — Evaluation (Signed)
Physical Therapy Evaluation Patient Details Name: Andrea [name] MRN: [id number] DOB: November 27, 1945 Today's Date: 11/22/2020  History of Present Illness  [name] is a 75 y.o. female with medical history significant for insulin-dependent diabetes mellitus, hypertension, hyperlipidemia, GERD, history of meningitis, CAD, COPD, history of TIA, grade 1 diastolic dysfunction, depression, anxiety, osteoarthritis, who presents to the emergency department from home on 11/17/2020 at the request of her PCP for chief concerns of osteomyelitis of the left heel.  L hip surgery on 08/19/2020. Underwent debridement of heel ulcer with partial calcanectomy and placement of antibiotic beads with application of wound vac left on 11/20/2020. NWB L LE.   Clinical Impression  Received patient reclining in bed, awake and oriented and able to provide detailed history. Patient reports that prior to hospitalization she has been mainly using w/c for mobility, pushing herself in the home and with family members pushing her in the community. States she is I with ADLs but does need help with IADLs such as cooking and grocery shopping. She admits she does do some cooking from her w/c that she feels is unsafe due to the position she is in with hot pans. Upon PT evaluation patient requires min A for bed mobility and pivot transfer from bed to chair using RW with 2 clinicians assisting to help manage lines and for safety. Patient did have some difficulty with pain weight bearing through R LE during pivot transfer and was able to complete it with slight pressure on her L LE instead of fulling non weight bearing. Educated about importance of maintaining weight bearing precautions and encouraged to comply. Patient was unable to attempt ambulation safely at this time. Patient appears to have experienced a decrease in functional activity tolerance and independence and currently requires SNF level care to maintain weight bearing precautions and  complete basic care safely. PT therefore recommends short term rehab prior to returning home. Patient would benefit from skilled physical therapy to address impairments and functional limitations to work towards stated goals and return to PLOF or maximal functional independence.       Recommendations for follow up therapy are one component of a multi-disciplinary discharge planning process, led by the attending physician.  Recommendations may be updated based on patient status, additional functional criteria and insurance authorization.  Follow Up Recommendations SNF;Supervision for mobility/OOB    Equipment Recommendations  3in1 (PT)    Recommendations for Other Services OT consult     Precautions / Restrictions Precautions Precautions: Fall Restrictions Weight Bearing Restrictions: Yes LLE Weight Bearing: Non weight bearing      Mobility  Bed Mobility Overal bed mobility: Needs Assistance Bed Mobility: Supine to Sit     Supine to sit: Min assist     General bed mobility comments: needed support for L LE    Transfers Overall transfer level: Needs assistance   Transfers: Sit to/from Stand;Stand Pivot Transfers Sit to Stand: Min assist;+2 safety/equipment;From elevated surface Stand pivot transfers: Min assist;+2 safety/equipment;From elevated surface       General transfer comment: Pateint completed sit <> stand at edge of bed with min A and RW, then stand pivot transfer using min A and RW shifting towards right side of body. One clinician physically assisted at left UE and 2nd clinician guided lines and chair for safety. Patient required encouragement and cuing for safe hand placement and body position. PT foot under pt's left foot to assess for weight bearing compliance. Patient was able to complete transfer with minimal pressure to L  LE but unable to keep it off the floor completely.  Ambulation/Gait             General Gait Details: not appropriate at this  time.  Stairs            Wheelchair Mobility    Modified Rankin (Stroke Patients Only)       Balance Overall balance assessment: Needs assistance Sitting-balance support: No upper extremity supported (R foot supported) Sitting balance-Leahy Scale: Good Sitting balance - Comments: steady sitting at EOB but needs help with L LE for weight shifting outside of BOS   Standing balance support: Bilateral upper extremity supported;During functional activity Standing balance-Leahy Scale: Poor Standing balance comment: Patient dependent on RW and min A from PT while standing to maintain balance.                             Pertinent Vitals/Pain Pain Assessment: Faces Faces Pain Scale: Hurts little more Pain Location: R LE when weight bearing Pain Descriptors / Indicators: Grimacing;Guarding;Moaning Pain Intervention(s): Limited activity within patient's tolerance;Monitored during session;Repositioned    Home Living Family/patient expects to be discharged to:: Private residence Living Arrangements: Spouse/significant other Available Help at Discharge: Family;Available PRN/intermittently;Friend(s) Type of Home: Mobile home Home Access: Ramped entrance     Home Layout: One level Home Equipment: [place] - 4 wheels;Walker - 2 wheels;Tub bench;Wheelchair - manual;Toilet riser Additional Comments: cannot get W/C to toilet and is using toilet that is lower where she could pivot with the walker. Uses toilet riser on this one. Does not have a BSC.    Prior Function Level of Independence: Needs assistance   Gait / Transfers Assistance Needed: Uses manual WC for household mobility and transfers with RW to toilet while home alone. Uses manual w/c pushed by family in the community.  ADL's / Homemaking Assistance Needed: states she is I with ADLsbut needs somse help with IADLs like shopping and cooking. Does some cooking on her own but feels it is dangerous.  Comments: Patient  reports she used RW for mobility prior to her hip surgery in June 2022. Since that surgery has spent time in SNF rehab and progressed as far as attempting ambulation with toe touch weight bearing with RW in rehab.     Hand Dominance   Dominant Hand: Right    Extremity/Trunk Assessment   Upper Extremity Assessment Upper Extremity Assessment: Defer to OT evaluation    Lower Extremity Assessment Lower Extremity Assessment: LLE deficits/detail;RLE deficits/detail RLE Deficits / Details: R LE ~ 4/5 and painful with full weight bearing. R heel slightly boggy. LLE Deficits / Details: unable to assess fully due to NWB status on L LE. LLE: Unable to fully assess due to pain    Cervical / Trunk Assessment Cervical / Trunk Assessment: Normal  Communication   Communication: No difficulties  Cognition Arousal/Alertness: Awake/alert Behavior During Therapy: WFL for tasks assessed/performed Overall Cognitive Status: Within Functional Limits for tasks assessed                                        General Comments      Exercises Other Exercises Other Exercises: educated patient on role of PT in acute care setting. Educated on safe mobility techniques.   Assessment/Plan    PT Assessment Patient needs continued PT services  PT Problem List Decreased strength;Decreased coordination;Pain;Decreased  range of motion;Decreased activity tolerance;Decreased knowledge of use of DME;Decreased balance;Decreased mobility;Decreased knowledge of precautions;Decreased skin integrity       PT Treatment Interventions DME instruction;Balance training;Gait training;Neuromuscular re-education;Stair training;Functional mobility training;Patient/family education;Therapeutic activities;Therapeutic exercise;Manual techniques;Modalities    PT Goals (Current goals can be found in the Care Plan section)  Acute Rehab PT Goals Patient Stated Goal: to get better and go home PT Goal Formulation:  With patient Time For Goal Achievement: 12/06/20 Potential to Achieve Goals: Fair    Frequency 7X/week   Barriers to discharge Decreased caregiver support Patient is left for hours home alone and is not able to transfer safely by herself.    Co-evaluation PT/OT/SLP Co-Evaluation/Treatment: Yes Reason for Co-Treatment: Complexity of the patient's impairments (multi-system involvement);For patient/therapist safety;To address functional/ADL transfers PT goals addressed during session: Mobility/safety with mobility;Proper use of DME;Balance;Strengthening/ROM         AM-PAC PT "6 Clicks" Mobility  Outcome Measure Help needed turning from your back to your side while in a flat bed without using bedrails?: A Little Help needed moving from lying on your back to sitting on the side of a flat bed without using bedrails?: A Little Help needed moving to and from a bed to a chair (including a wheelchair)?: A Little Help needed standing up from a chair using your arms (e.g., wheelchair or bedside chair)?: A Little Help needed to walk in hospital room?: Total Help needed climbing 3-5 steps with a railing? : Total 6 Click Score: 14    End of Session Equipment Utilized During Treatment: Gait belt Activity Tolerance: Patient tolerated treatment well;Patient limited by pain Patient left: in chair;with call bell/phone within reach;with chair alarm set Nurse Communication: Mobility status PT Visit Diagnosis: Unsteadiness on feet (R26.81);Other abnormalities of gait and mobility (R26.89);Muscle weakness (generalized) (M62.81);Difficulty in walking, not elsewhere classified (R26.2)    Time: [id number] PT Time Calculation (min) (ACUTE ONLY): 31 min   Charges:   PT Evaluation $PT Eval Low Complexity: 1 Low PT Treatments $Therapeutic Activity: 8-22 mins       Everlean Alstrom. Graylon Good, PT, DPT 11/22/20, 12:23 PM

## 2020-11-22 NOTE — Op Note (Signed)
OPERATIVE REPORT Patient name: Andrea [name] MRN: [id number] DOB: 1945-07-31  DOS: 11/20/2020  Preop Dx: Osteomyelitis Postop Dx: same  Procedure:  1.  Excisional debridement of ulcer left Achilles 2.  Bone biopsy left Achilles 3.  Partial calcanectomy left Achilles 4.  Placement of absorbable osteoset antibiotic beads 5.  Application of negative pressure wound VAC  Surgeon: Edrick Kins DPM  Anesthesia: General anesthesia  Hemostasis: Calf tourniquet inflated to a pressure of 276mHg after esmarch exsanguination   EBL: 50 mL Materials: OsteoSet absorbable antibiotic beads prepared in combination with 500 mg vancomycin powder and 600 mg tobramycin powder Injectables: None Pathology: Bone biopsy left calcaneus  Condition: The patient tolerated the procedure and anesthesia well. No complications noted or reported   Justification for procedure: The patient is a 75y.o. 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 All conservative modalities of been unsuccessful in providing any sort of satisfactory alleviation of symptoms with the patient. The patient was told benefits as well as possible side effects of the surgery. The patient consented for surgical correction. The patient consent form was reviewed. All patient questions were answered. No guarantees were expressed or implied. The patient and the surgeon boson the patient consent form with the witness present and placed in the patient's chart.   Procedure in Detail: The patient was brought to the operating room, placed in the operating table in the prone position at which time an aseptic scrub and drape were performed about the patient's respective lower extremity after anesthesia was induced as described above. Attention was then directed to the surgical area where procedure number one commenced.  Procedure #1: Excisional debridement of ulcer  left Medically necessary excisional debridement of the left Achilles tendon ulcer was performed using a surgical #15 scalpel and versajet debridement wand.  Excisional debridement of all necrotic nonviable tissue down to healthy bleeding viable tissue was performed with postdebridement measurement same as pre-.  The wound measures approximately 3 cm x 2.5 cm x 1.0 cm.  Immediately underlying the necrotic portion of the Achilles tendon there was exposed necrotic bone which was consistent with findings based on MRI of osteomyelitis.  Attention was directed to this area where procedure #2 commenced  Procedure #2: Bone biopsy calcaneus left Copious irrigation of the wound was performed prior to harvesting of the bone biopsy.  A portion of bone from the posterior tubercle of the calcaneus was harvested and placed in sterile specimen container and sent to pathology for gross microscopic exam.  Procedure #3,4,5: Partial calcanectomy/antibiotic beads/wound VAC Again, within the ulcer site the posterior tubercle of the calcaneus was well visualized and the decision was made to perform partial calcanectomy using a combination of osteotome and curettage.  The posterior to superior tubercle of the calcaneus was removed and the void was filled using OsteoSet absorbable antibiotic beads in combination with the 500 mg of vancomycin powder and 600 mg of tobramycin powder which was prepared on the sterile operating table in the appropriate manner After placement of the antibiotic beads a negative pressure wound VAC was applied to the posterior heel in the standard manner.  It was connected to the wound vacuum to ensure no leaks which was satisfactory.  The wound VAC was functioning properly.  Dry sterile compressive dressings were then applied to all previously mentioned incision sites about the patient's lower extremity. The tourniquet which was used for hemostasis was deflated. All normal neurovascular responses  including pink color and warmth returned all the digits of patient's lower extremity.  The patient was then transferred from the operating room to the recovery room having tolerated the procedure and anesthesia well. All vital signs are stable. After a brief stay in the recovery room the patient was readmitted to inpatient room with adequate prescriptions for analgesia. Verbal as well as written instructions were provided for the patient regarding wound care. The patient is to keep the dressings clean dry and intact.  Podiatry will follow  Edrick Kins, DPM [facility]  Dr. Edrick Kins, DPM    [address], [place] [address]                Office [phone]  Fax [phone]

## 2020-11-22 NOTE — TOC Progression Note (Signed)
Transition of Care [facility]) - Progression Note    Patient Details  Name: Andrea [name] MRN: [id number] Date of Birth: Aug 15, 1945  Transition of Care [facility]) CM/SW Contact  Izola Price, RN Phone Number: 11/22/2020, 3:57 PM  Clinical Narrative:  Patient does not want [facility] per prior CM notes. Disposition recommendation now SNF. Bed search initiated.  Simmie Davies RN CM          Expected Discharge Plan and Services                                                 Social Determinants of Health (SDOH) Interventions    Readmission Risk Interventions Readmission Risk Prevention Plan 08/21/2020  Transportation Screening Complete  PCP or Specialist Appt within 5-7 Days Complete  Home Care Screening Complete  Medication Review (RN CM) Complete  Some recent data might be hidden

## 2020-11-22 NOTE — NC FL2 (Signed)
[place] LEVEL OF CARE SCREENING TOOL     IDENTIFICATION  Patient Name: Andrea [name] Birthdate: [birth year] Sex: female Admission Date (Current Location): 11/17/2020  [facility] and [place] Number:  Engineering geologist and Address:  [facility], [address] [place], [place] [address]      Provider Number: [id number]  Attending Physician Name and Address:  Gwynne Edinger, MD  Relative Name and Phone Number:  Darlina Sicilian (Daughter)   [phone] [facility] Phone)    Current Level of Care: Hospital Recommended Level of Care: [place] Prior Approval Number:    Date Approved/Denied: 11/27/13 PASRR Number: [id number]  Discharge Plan: SNF    Current Diagnoses: Patient Active Problem List   Diagnosis Date Noted   Osteomyelitis ([place]) 11/17/2020   Cellulitis of left lower extremity 11/13/2020   Hypoglycemia 11/13/2020   Non healing left heel wound 11/13/2020   Status post total hip replacement, 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   Pain due to onychomycosis of toenail of left foot 03/06/2020   Malaise and fatigue 12/28/2019   UTI (urinary tract infection) 12/28/2019   Hyponatremia 12/27/2019   Intertrigo 12/27/2019   Acute respiratory failure with hypoxia ([place]) 11/23/2019   History of meningitis 11/23/2019   CSF leak from nose 11/22/2019   History of myocardial infarction 03/09/2019   PAD (peripheral artery disease) ([place]) 12/28/2018   Moderate episode of recurrent major depressive disorder ([place]) 12/19/2017   Leg length discrepancy 09/21/2017   Coronary artery disease involving native coronary artery 01/06/2017   Drug-induced constipation 01/06/2017   Spinal stenosis of lumbar region 01/06/2017   Spondylolisthesis of lumbar region 12/30/2016   Obese 04/16/2015   History of colon polyps 11/23/2012   Bundle branch block, 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   Personal history of transient ischemic attack (TIA), and cerebral infarction without  residual deficits 11/23/2012   Encounter for long-term (current) use of aspirin 11/23/2012   Status post percutaneous transluminal coronary angioplasty 11/23/2012   Transient ischemic attack (TIA), and cerebral infarction without residual deficits(V12.54) 11/23/2012   Anemia 08/17/2012   Chronic obstructive pulmonary disease ([place]) 08/17/2012   HLD (hyperlipidemia) 08/17/2012   OP (osteoporosis) 08/17/2012   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 edema, with long-term current use of insulin ([place]) 08/17/2012   Ischemic heart disease due to coronary artery obstruction ([place]) 07/24/2012    Orientation RESPIRATION BLADDER Height & Weight     Self, Time, Situation, Place  Normal Incontinent Weight: 62.6 kg Height:  5' (152.4 cm)  BEHAVIORAL SYMPTOMS/MOOD NEUROLOGICAL BOWEL NUTRITION STATUS      Continent Diet  AMBULATORY STATUS COMMUNICATION OF NEEDS Skin   Extensive Assist (NWB LLE) Verbally Wound Vac, Surgical wounds, Other (Comment) (LLE non healing heel wound.)                       Personal Care Assistance Level of Assistance  Bathing, Feeding, Dressing Bathing Assistance: Maximum assistance Feeding assistance: Limited assistance Dressing Assistance: Maximum assistance     Functional Limitations Info             SPECIAL CARE FACTORS FREQUENCY  PT (By licensed PT), OT (By licensed OT)     PT Frequency: 5x/week OT Frequency: 5x/week            Contractures Contractures Info: Not present    Additional Factors Info  Code Status, Allergies Code Status Info: Full Code Allergies Info: Metformin And Related: Low Diarrhea &  Sulfa Antibiotics Low: Rash           Current Medications (11/22/2020):  This is the current hospital active medication list Current Facility-Administered Medications  Medication Dose Route Frequency Provider Last Rate Last Admin   0.9 %  sodium chloride infusion   Intravenous PRN  Cox, Amy N, DO 10 mL/hr at 11/21/20 2136 1,000 mL at 11/21/20 2136   acetaminophen (TYLENOL) tablet 1,000 mg  1,000 mg Oral TID Ralene Muskrat B, MD   1,000 mg at 11/22/20 0903   atorvastatin (LIPITOR) tablet 80 mg  80 mg Oral QHS Cox, Amy N, DO   80 mg at 11/21/20 2130   ceFEPIme (MAXIPIME) 2 g in sodium chloride 0.9 % 100 mL IVPB  2 g Intravenous Q12H Lu Duffel, RPH 200 mL/hr at 11/22/20 0908 2 g at 11/22/20 0908   enoxaparin (LOVENOX) injection 40 mg  40 mg Subcutaneous Q24H Lu Duffel, RPH   40 mg at 11/22/20 [id number]   Glycerin (Adult) 2 g suppository 1 suppository  1 suppository Rectal Daily PRN Gwynne Edinger, MD       HYDROmorphone (DILAUDID) injection 1-2 mg  1-2 mg Intravenous Q3H PRN Gwynne Edinger, MD   1 mg at 11/21/20 1829   insulin aspart (novoLOG) injection 0-15 Units  0-15 Units Subcutaneous TID WC Cox, Amy N, DO   3 Units at 11/22/20 1250   insulin aspart (novoLOG) injection 0-5 Units  0-5 Units Subcutaneous QHS Cox, Amy N, DO   2 Units at 11/17/20 2308   insulin glargine-yfgn (SEMGLEE) injection 50 Units  50 Units Subcutaneous QHS Wouk, Ailene Rud, MD       linezolid (ZYVOX) IVPB 600 mg  600 mg Intravenous Q12H Gwynne Edinger, MD 300 mL/hr at 11/22/20 1024 600 mg at 11/22/20 1024   metoprolol succinate (TOPROL-XL) 24 hr tablet 25 mg  25 mg Oral Daily Cox, Amy N, DO   25 mg at 11/22/20 0903   ondansetron (ZOFRAN) tablet 4 mg  4 mg Oral Q6H PRN Cox, Amy N, DO       Or   ondansetron (ZOFRAN) injection 4 mg  4 mg Intravenous Q6H PRN Cox, Amy N, DO   4 mg at 11/20/20 1254   oxybutynin (DITROPAN-XL) 24 hr tablet 10 mg  10 mg Oral QHS Cox, Amy N, DO   10 mg at 11/21/20 2132   oxyCODONE (Oxy IR/ROXICODONE) immediate release tablet 5 mg  5 mg Oral Q4H PRN Ralene Muskrat B, MD   5 mg at 11/22/20 1303   pantoprazole (PROTONIX) EC tablet 80 mg  80 mg Oral QAC breakfast Cox, Amy N, DO   80 mg at 11/22/20 0903   polyethylene glycol (MIRALAX / GLYCOLAX)  packet 17 g  17 g Oral Daily Gwynne Edinger, MD   17 g at 11/22/20 [id number]   senna (SENOKOT) tablet 8.6 mg  1 tablet Oral Daily Gwynne Edinger, MD   8.6 mg at 11/22/20 [id number]   vitamin B-12 (CYANOCOBALAMIN) tablet 1,000 mcg  1,000 mcg Oral Daily Cox, Amy N, DO   1,000 mcg at 11/22/20 [id number]     Discharge Medications: Please see discharge summary for a list of discharge medications.  Relevant Imaging Results:  Relevant Lab Results:   Additional Information    Izola Price, RN

## 2020-11-22 NOTE — Evaluation (Signed)
Occupational Therapy Evaluation Patient Details Name: Andrea [name] MRN: [id number] DOB: 11/23/1945 Today's Date: 11/22/2020   History of Present Illness Andrea [name] is a 75 y.o. female with medical history significant for insulin-dependent diabetes mellitus, hypertension, hyperlipidemia, GERD, history of meningitis, CAD, COPD, history of TIA, grade 1 diastolic dysfunction, depression, anxiety, osteoarthritis, who presents to the emergency department from home on 11/17/2020 at the request of her PCP for chief concerns of osteomyelitis of the left heel.  L hip surgery on 08/19/2020. Underwent debridement of heel ulcer with partial calcanectomy and placement of antibiotic beads with application of wound vac left on 11/20/2020. NWB L LE.   Clinical Impression   Pt seen for OT evaluation this date in setting of acute hospitalization d/t L foot debridement. Pt reports being MOD I for fxl mobility with w/c at baseline and states that she does require assist from her spouse to push up ramp to enter their home. Pt presents this date somewhat limited by pain and post-op NWB to L LE precautions superimposed on baseline weakness. Pt requires MIN A +2 for STS and SPS transfers with RW for UE support. She demos G sitting balance, P standing balance. Pt requires cues from OT for hand placement and cues from PT to adhere to WB restrictions. On ADL assessment, pt requires SETUP for UB ADLs and MOD A for seated LB ADLs. Pt transferred to chair and is setup with all needs met and in reach. Will continue to follow acutely. Anticipate that pt will require STR upon d/c from acute setting d/t weakness and decreased ADL indep.      Recommendations for follow up therapy are one component of a multi-disciplinary discharge planning process, led by the attending physician.  Recommendations may be updated based on patient status, additional functional criteria and insurance authorization.   Follow Up Recommendations  SNF     Equipment Recommendations  Other (comment) (defer to next level of care)    Recommendations for Other Services       Precautions / Restrictions Precautions Precautions: Fall Restrictions Weight Bearing Restrictions: Yes LLE Weight Bearing: Non weight bearing      Mobility Bed Mobility Overal bed mobility: Needs Assistance Bed Mobility: Supine to Sit     Supine to sit: Min assist     General bed mobility comments: needed support for L LE    Transfers Overall transfer level: Needs assistance Equipment used: Rolling walker (2 wheeled) Transfers: Sit to/from [facility] Sit to Stand: Min assist;+2 safety/equipment;From elevated surface Stand pivot transfers: Min assist;+2 safety/equipment;From elevated surface       General transfer comment: OT cued for hand placement with RW use, pt apprehensive requiring encouragement and increased time. PT assisted with ensuring NWB to L LE. Pt was touching toe down some, but able to mostly maintain precautions.    Balance Overall balance assessment: Needs assistance Sitting-balance support: No upper extremity supported (R foot supported) Sitting balance-Leahy Scale: Good Sitting balance - Comments: G static sitting   Standing balance support: Bilateral upper extremity supported;During functional activity Standing balance-Leahy Scale: Poor Standing balance comment: reliant on UE support on RW bilaterally, PT assisting pt to adhere to precautions.                           ADL either performed or assessed with clinical judgement   ADL  General ADL Comments: requries SETUP for seated UB ADLs, MOD A for seated LB ADLs. MIN A +2 for STS, SPS with RW for UE Support.     Vision Patient Visual Report: No change from baseline       Perception     Praxis      Pertinent Vitals/Pain Pain Assessment: Faces Faces Pain Scale: Hurts little more Pain  Location: R LE when weight bearing Pain Descriptors / Indicators: Grimacing;Guarding;Moaning Pain Intervention(s): Limited activity within patient's tolerance;Monitored during session;Repositioned     Hand Dominance Right   Extremity/Trunk Assessment Upper Extremity Assessment Upper Extremity Assessment: Overall WFL for tasks assessed;Generalized weakness (ROM WFL, MMT grossly 4-/5)   Lower Extremity Assessment Lower Extremity Assessment: Defer to PT evaluation;LLE deficits/detail RLE Deficits / Details: R heel slightly boggy, floated both heels while pt in sitting for protection and educated LLE Deficits / Details: NWB LLE: Unable to fully assess due to pain   Cervical / Trunk Assessment Cervical / Trunk Assessment: Normal   Communication Communication Communication: No difficulties   Cognition Arousal/Alertness: Awake/alert Behavior During Therapy: WFL for tasks assessed/performed Overall Cognitive Status: Within Functional Limits for tasks assessed                                     General Comments       Exercises Other Exercises Other Exercises: OT ed re: role of OT, importance of OOB activity, positioing for skin protection, checking skin of bony prominences.   Shoulder Instructions      Home Living Family/patient expects to be discharged to:: Private residence Living Arrangements: Spouse/significant other Available Help at Discharge: Family;Available PRN/intermittently;Friend(s) Type of Home: Mobile home Home Access: Ramped entrance     Home Layout: One level     Bathroom Shower/Tub: Tub/shower unit;Walk-in shower   Bathroom Toilet: Standard     Home Equipment: Environmental consultant - 4 wheels;Walker - 2 wheels;Tub bench;Wheelchair - manual;Toilet riser   Additional Comments: cannot get W/C to toilet and is using toilet that is lower where she could pivot with the walker. Uses toilet riser on this one. Does not have a BSC.      Prior  Functioning/Environment Level of Independence: Needs assistance  Gait / Transfers Assistance Needed: Uses manual WC for household mobility and transfers with RW to toilet while home alone. Uses manual w/c pushed by family in the community. ADL's / Homemaking Assistance Needed: states she is I with ADLsbut needs somse help with IADLs like shopping and cooking. Does some cooking on her own but feels it is dangerous.   Comments: Patient reports she used RW for mobility prior to her hip surgery in June 2022. Since that surgery has spent time in SNF rehab and progressed as far as attempting ambulation with toe touch weight bearing with RW in rehab.        OT Problem List: Decreased strength;Decreased activity tolerance;Impaired balance (sitting and/or standing);Decreased knowledge of use of DME or AE;Impaired sensation;Pain      OT Treatment/Interventions: Self-care/ADL training;Therapeutic exercise;DME and/or AE instruction;Patient/family education;Balance training;Therapeutic activities    OT Goals(Current goals can be found in the care plan section) Acute Rehab OT Goals Patient Stated Goal: to get better and go home OT Goal Formulation: With patient Time For Goal Achievement: 12/06/20 Potential to Achieve Goals: Good ADL Goals Pt Will Perform Lower Body Dressing: with supervision;sit to/from stand;with adaptive equipment (with LRAD for standing clothing mgt  over hips) Pt Will Transfer to Toilet: with supervision;stand pivot transfer;bedside commode Pt Will Perform Toileting - Clothing Manipulation and hygiene: with supervision;sitting/lateral leans Pt/caregiver will Perform Home Exercise Program: Increased strength;Both right and left upper extremity;With Supervision  OT Frequency: Min 1X/week   Barriers to D/C:            Co-evaluation PT/OT/SLP Co-Evaluation/Treatment: Yes Reason for Co-Treatment: Complexity of the patient's impairments (multi-system involvement);For  patient/therapist safety PT goals addressed during session: Mobility/safety with mobility OT goals addressed during session: ADL's and self-care      AM-PAC OT "6 Clicks" Daily Activity     Outcome Measure Help from another person eating meals?: None Help from another person taking care of personal grooming?: A Little Help from another person toileting, which includes using toliet, bedpan, or urinal?: A Lot Help from another person bathing (including washing, rinsing, drying)?: A Lot Help from another person to put on and taking off regular upper body clothing?: A Little Help from another person to put on and taking off regular lower body clothing?: A Lot 6 Click Score: 16   End of Session Equipment Utilized During Treatment: Gait belt;Rolling walker Nurse Communication: Mobility status  Activity Tolerance: Patient tolerated treatment well Patient left: in chair;with call bell/phone within reach;with chair alarm set  OT Visit Diagnosis: Unsteadiness on feet (R26.81);Muscle weakness (generalized) (M62.81);Pain Pain - Right/Left: Left Pain - part of body: Leg                Time: 8260-8883 OT Time Calculation (min): 31 min Charges:  OT General Charges $OT Visit: 1 Visit OT Evaluation $OT Eval Moderate Complexity: [place], [place], OTR/L ascom [phone] 11/22/20, 1:35 PM

## 2020-11-22 NOTE — Progress Notes (Signed)
Andrea [name] PROGRESS NOTE    VERGA DABNEY  [id number] DOB: 06-May-1945 DOA: 11/17/2020 PCP: [name], FNP   Brief Narrative:  75 y.o. female with medical history significant for insulin-dependent diabetes mellitus, hypertension, hyperlipidemia, GERD, history of meningitis, CAD, COPD, history of TIA, grade 1 diastolic dysfunction, depression, anxiety, osteoarthritis, who presents to the emergency department from home at the request of her PCP for chief concerns of osteomyelitis of the left heel.   She states that the wound has been there for 3 months and she has taken antibiotics in it and has not resolved.  She states that this started after her orthopedic boot was taken off and she thinks that there might have been a scratch.  She states that the wound has progressively worsened prompting her to present to her PCP.  Her PCP ordered a foot x-ray on 11/13/2020 and resulted today.  She endorses inability to bear weight due to the pain.   Assessment & Plan:   Principal Problem:   Osteomyelitis ([place]) Active Problems:   Chronic obstructive pulmonary disease (HCC)   HLD (hyperlipidemia)   Coronary artery disease involving native coronary artery   PAD (peripheral artery disease) (HCC)   History of meningitis   Status post total hip replacement, left   Cellulitis of left lower extremity  Left heel osteomyelitis No sepsis. Recent ABIs wnl b/l. S/p debridement and partial calcectomy in the OR w/ podiatry on 9/15. Treated here w/ linezolid and cefepime (switched to linezolid from vanc 2/2 aki). Pain well controlled. - f/u cultures - ID consulted for assistance w/ abx choice and duration. Likely 4-6 weeks, likely transition to dapto. Will need picc prior to d/c - pt/ot consulted, pt interested in snf if qualifies - wound vac in place, will need changed 2x weekly - oxy/dilaudid for pain control. Bowel regimen ordered  Acute kidney injury Poss 2/2 vanc, discontinued 9/15. Also holding nsaid  and arb. Improving, cr 1.19 today - monitor  Insulin-dependent diabetes mellitus Glucose elevated today - increase lantus to 50, continue SSI    Hypertension Well controlled Stopped losartan as above - PTA Toprol-XL 25 mg daily  Hyperlipidemia - PTA Lipitor 80 mg daily  Overactive bladder - PTA oxybutynin 20 mg nightly  GERD - PPI   History of meningitis and CSF leak No acute issues  DVT prophylaxis: SQ Lovenox Code Status: Full Family Communication: daughter joy updated telephonically 9/16 Disposition Plan:Status is: inpt  The patient will require care spanning > 2 midnights and should be moved to inpatient because: Inpatient level of care appropriate due to severity of illness  Dispo: The patient is from: Home              Anticipated d/c is to: SNF?              Patient currently is not medically stable to d/c.   Difficult to place patient No    Level of care: Med-Surg  Consultants:  Podiatry-Dr. Amalia Hailey  Procedures:  Debridement in OR 9/15  Antimicrobials:  linezolid Cefepime   Subjective: Seen and examined.  Says pain much improved from yesterday. No chest pain, no DOE.   Objective: Vitals:   11/21/20 1619 11/21/20 2147 11/22/20 0443 11/22/20 0745  BP: (!) 111/40 (!) 115/56 (!) 117/58 (!) 117/47  Pulse: 93 87 86 92  Resp: '18 20 20 16  '$ Temp: 98.5 F (36.9 C) 98.6 F (37 C) 97.6 F (36.4 C) 97.7 F (36.5 C)  TempSrc: Oral Oral  SpO2: 98% 98% 100% 100%  Weight:      Height:        Intake/Output Summary (Last 24 hours) at 11/22/2020 1218 Last data filed at 11/21/2020 2147 Gross per 24 hour  Intake 1026.99 ml  Output 140 ml  Net 886.99 ml   Filed Weights   11/17/20 1744 11/20/20 1538  Weight: 62.6 kg 62.6 kg    Examination:  General exam: Appears calm and comfortable  Respiratory system: Lungs clear.  Normal work of breathing.  Room air Cardiovascular system: S1-S2, RRR, no murmurs, no pedal edema Gastrointestinal system: Abdomen  is nondistended, soft and nontender. No organomegaly or masses felt. Normal bowel sounds heard. Central nervous system: Alert and oriented. No focal neurological deficits. Extremities: Left heel full-thickness ulcer with surrounding edema and serosanguineous drainage Skin: lft heel bandaged Psychiatry: Judgement and insight appear normal. Mood & affect appropriate.     Data Reviewed: I have personally reviewed following labs and imaging studies  CBC: Recent Labs  Lab 11/17/20 1747 11/18/20 0503 11/20/20 0247 11/21/20 0420 11/22/20 0433  WBC 10.1 7.6 7.0 8.7 10.0  HGB 11.5* 9.8* 9.6* 8.7* 8.2*  HCT 34.9* 30.4* 30.5* 28.1* 26.8*  MCV 87.3 88.1 88.7 88.9 91.2  PLT 233 200 183 175 [id number]   Basic Metabolic Panel: Recent Labs  Lab 11/17/20 1747 11/18/20 0503 11/20/20 0247 11/21/20 0420 11/22/20 0433  NA 138 138 136 135 133*  K 4.3 4.3 4.7 5.0 5.0  CL 104 107 109 113* 109  CO2 29 25 21* 19* 21*  GLUCOSE 123* 131* 170* 226* 209*  BUN 33* 34* 33* 38* 34*  CREATININE 0.97 0.77 1.49* 1.14* 1.19*  CALCIUM 9.1 8.5* 8.3* 8.4* 8.8*   GFR: Estimated Creatinine Clearance: 33.7 mL/min (A) (by C-G formula based on SCr of 1.19 mg/dL (H)). Liver Function Tests: No results for input(s): AST, ALT, ALKPHOS, BILITOT, PROT, ALBUMIN in the last 168 hours. No results for input(s): LIPASE, AMYLASE in the last 168 hours. No results for input(s): AMMONIA in the last 168 hours. Coagulation Profile: No results for input(s): INR, PROTIME in the last 168 hours. Cardiac Enzymes: No results for input(s): CKTOTAL, CKMB, CKMBINDEX, TROPONINI in the last 168 hours. BNP (last 3 results) No results for input(s): PROBNP in the last 8760 hours. HbA1C: No results for input(s): HGBA1C in the last 72 hours.  CBG: Recent Labs  Lab 11/21/20 0857 11/21/20 1241 11/21/20 1616 11/21/20 2018 11/22/20 0747  GLUCAP 194* 320* 205* 164* 143*   Lipid Profile: No results for input(s): CHOL, HDL, LDLCALC, TRIG,  CHOLHDL, LDLDIRECT in the last 72 hours. Thyroid Function Tests: No results for input(s): TSH, T4TOTAL, FREET4, T3FREE, THYROIDAB in the last 72 hours. Anemia Panel: No results for input(s): VITAMINB12, FOLATE, FERRITIN, TIBC, IRON, RETICCTPCT in the last 72 hours. Sepsis Labs: No results for input(s): PROCALCITON, LATICACIDVEN in the last 168 hours.  Recent Results (from the past 240 hour(s))  Resp Panel by RT-PCR (Flu A&B, Covid) Nasopharyngeal Swab     Status: None   Collection Time: 11/17/20  7:45 AM   Specimen: Nasopharyngeal Swab; Nasopharyngeal(NP) swabs in vial transport medium  Result Value Ref Range Status   SARS Coronavirus 2 by RT PCR NEGATIVE NEGATIVE Final    Comment: (NOTE) SARS-CoV-2 target nucleic acids are NOT DETECTED.  The SARS-CoV-2 RNA is generally detectable in upper respiratory specimens during the acute phase of infection. The lowest concentration of SARS-CoV-2 viral copies this assay can detect is 138 copies/mL. A negative result  does not preclude SARS-Cov-2 infection and should not be used as the sole basis for treatment or other patient management decisions. A negative result may occur with  improper specimen collection/handling, submission of specimen other than nasopharyngeal swab, presence of viral mutation(s) within the areas targeted by this assay, and inadequate number of viral copies(<138 copies/mL). A negative result must be combined with clinical observations, patient history, and epidemiological information. The expected result is Negative.  Fact Sheet for Patients:  EntrepreneurPulse.com.au  Fact Sheet for Healthcare Providers:  IncredibleEmployment.be  This test is no t yet approved or cleared by the [place] FDA and  has been authorized for detection and/or diagnosis of SARS-CoV-2 by FDA under an Emergency Use Authorization (EUA). This EUA will remain  in effect (meaning this test can be used) for  the duration of the COVID-19 declaration under Section 564(b)(1) of the Act, 21 U.S.C.section 360bbb-3(b)(1), unless the authorization is terminated  or revoked sooner.       Influenza A by PCR NEGATIVE NEGATIVE Final   Influenza B by PCR NEGATIVE NEGATIVE Final    Comment: (NOTE) The Xpert Xpress SARS-CoV-2/FLU/RSV plus assay is intended as an aid in the diagnosis of influenza from Nasopharyngeal swab specimens and should not be used as a sole basis for treatment. Nasal washings and aspirates are unacceptable for Xpert Xpress SARS-CoV-2/FLU/RSV testing.  Fact Sheet for Patients: EntrepreneurPulse.com.au  Fact Sheet for Healthcare Providers: IncredibleEmployment.be  This test is not yet approved or cleared by the [place] FDA and has been authorized for detection and/or diagnosis of SARS-CoV-2 by FDA under an Emergency Use Authorization (EUA). This EUA will remain in effect (meaning this test can be used) for the duration of the COVID-19 declaration under Section 564(b)(1) of the Act, 21 U.S.C. section 360bbb-3(b)(1), unless the authorization is terminated or revoked.  Performed at [facility], [address] [place], [place] [address]   CULTURE, BLOOD (ROUTINE X 2) w Reflex to ID Panel     Status: None   Collection Time: 11/17/20 10:34 PM   Specimen: BLOOD  Result Value Ref Range Status   Specimen Description BLOOD LEFT FOREARM  Final   Special Requests   Final    BOTTLES DRAWN AEROBIC AND ANAEROBIC Blood Culture adequate volume   Culture   Final    NO GROWTH 5 DAYS Performed at [facility], [address] [place], [place] [address]    Report Status 11/22/2020 FINAL  Final  CULTURE, BLOOD (ROUTINE X 2) w Reflex to ID Panel     Status: None   Collection Time: 11/17/20 10:35 PM   Specimen: BLOOD  Result Value Ref Range Status   Specimen Description BLOOD RIGHT FOREARM  Final   Special Requests   Final     BOTTLES DRAWN AEROBIC ONLY Blood Culture adequate volume   Culture   Final    NO GROWTH 5 DAYS Performed at [facility], [address] [place], [place] [address]    Report Status 11/22/2020 FINAL  Final  Surgical pcr screen     Status: None   Collection Time: 11/19/20 12:41 PM   Specimen: Nasal Mucosa; Nasal Swab  Result Value Ref Range Status   MRSA, PCR NEGATIVE NEGATIVE Final   Staphylococcus aureus NEGATIVE NEGATIVE Final    Comment: (NOTE) The Xpert SA Assay (FDA approved for NASAL specimens in patients 40 years of age and older), is one component of a comprehensive surveillance program. It is not intended to diagnose infection  nor to guide or monitor treatment. Performed at [facility], [place], [place] [address]   Aerobic/Anaerobic Culture w Gram Stain (surgical/deep wound)     Status: None (Preliminary result)   Collection Time: 11/20/20  5:08 PM   Specimen: PATH Bone biopsy; Tissue  Result Value Ref Range Status   Specimen Description   Final    HEEL Performed at [facility], [address] [place], [place] [address]    Special Requests   Final    LEFT HEEL BONE CULTURE Performed at [facility], [place], [place], [place] [address]    Gram Stain NO WBC SEEN RARE GRAM NEGATIVE RODS   Final   Culture   Final    NO GROWTH < 12 HOURS Performed at [facility], [place] [address] [place], [place] [address]    Report Status PENDING  Incomplete  Aerobic/Anaerobic Culture w Gram Stain (surgical/deep wound)     Status: None (Preliminary result)   Collection Time: 11/20/20  5:08 PM   Specimen: PATH Bone biopsy; Tissue  Result Value Ref Range Status   Specimen Description   Final    WOUND Performed at [facility], [address], [place], [place] [address]    Special Requests   Final    LEFT HEEL WD CULTURE Performed at [facility], [place], [place] [address]     Gram Stain NO WBC SEEN NO ORGANISMS SEEN   Final   Culture   Final    NO GROWTH 2 DAYS Performed at [facility], [place] [address], [place], [place] [address]    Report Status PENDING  Incomplete         Radiology Studies: DG Foot Complete Left  Result Date: 11/20/2020 CLINICAL DATA:  Postop EXAM: LEFT FOOT - COMPLETE 3+ VIEW COMPARISON:  11/17/2020 FINDINGS: Frontal, oblique, lateral views of the left foot are obtained. Postsurgical changes are seen from partial resection of the dorsal aspect of the calcaneus, with likely impregnated antibiotic beads at the surgical site. Overlying surgical drain. Bones are diffusely osteopenic. Chronic degenerative changes of the metatarsophalangeal and interphalangeal joints, with hammertoe deformities again noted. IMPRESSION: 1. Postoperative changes of the calcaneus as above. 2. Diffuse osteopenia and osteoarthritis. Electronically Signed   By: Randa Ngo M.D.   On: 11/20/2020 20:24        Scheduled Meds:  acetaminophen  1,000 mg Oral TID   atorvastatin  80 mg Oral QHS   enoxaparin (LOVENOX) injection  40 mg Subcutaneous Q24H   insulin aspart  0-15 Units Subcutaneous TID WC   insulin aspart  0-5 Units Subcutaneous QHS   insulin glargine-yfgn  45 Units Subcutaneous QHS   metoprolol succinate  25 mg Oral Daily   oxybutynin  10 mg Oral QHS   pantoprazole  80 mg Oral QAC breakfast   polyethylene glycol  17 g Oral Daily   senna  1 tablet Oral Daily   vitamin B-12  1,000 mcg Oral Daily   Continuous Infusions:  sodium chloride 1,000 mL (11/21/20 2136)   ceFEPime (MAXIPIME) IV 2 g (11/22/20 0908)   linezolid (ZYVOX) IV 600 mg (11/22/20 1024)     LOS: 4 days    Time spent: 20 minutes    Desma Maxim, MD [facility]  If 7PM-7AM, please contact night-coverage 11/22/2020, 12:18 PM

## 2020-11-22 NOTE — Progress Notes (Addendum)
PODIATRY PROGRESS NOTE  NAME Andrea [name] MRN [id number] DOB 1945-12-29 DOA 11/17/2020   Patient s/p debridement of heel ulcer with partial calcanectomy and placement of antibiotic beads with application of wound vac left. DOS: 11/20/2020. Patient states that over the last 24 hours there was a severe amount of pain however the patient is currently being managed with Dilaudid and she is resting comfortably in bed.  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   Depression    Diabetes mellitus type 2, uncomplicated ([place])    Diabetic retinopathy ([place])    Dupuytren contracture 2011   s/p surgery to LEFT hand   Frequent urinary tract infections    GERD (gastroesophageal reflux disease)    Grade I diastolic dysfunction    HTN (hypertension)    Hyperlipidemia, unspecified    Irritable bowel syndrome    Meningitis 11/2019   MI (myocardial infarction) ([place]) 1994   no stent   Neuromuscular disorder (HCC)    nerve pain in back and legs   Osteoporosis    Pneumonia 2021   RBBB    Sepsis ([place]) 11/2019   Skin cancer    TIA (transient ischemic attack) 2014   Vitamin D deficiency, unspecified    Wheezing     CBC Latest Ref Rng & Units 11/22/2020 11/21/2020 11/20/2020  WBC 4.0 - 10.5 K/uL 10.0 8.7 7.0  Hemoglobin 12.0 - 15.0 g/dL 8.2(L) 8.7(L) 9.6(L)  Hematocrit 36.0 - 46.0 % 26.8(L) 28.1(L) 30.5(L)  Platelets 150 - 400 K/uL 174 175 183    BMP Latest Ref Rng & Units 11/22/2020 11/21/2020 11/20/2020  Glucose 70 - 99 mg/dL 209(H) 226(H) 170(H)  BUN 8 - 23 mg/dL 34(H) 38(H) 33(H)  Creatinine 0.44 - 1.00 mg/dL 1.19(H) 1.14(H) 1.49(H)  BUN/Creat Ratio 12 - 28 - - -  Sodium  135 - 145 mmol/L 133(L) 135 136  Potassium 3.5 - 5.1 mmol/L 5.0 5.0 4.7  Chloride 98 - 111 mmol/L 109 113(H) 109  CO2 22 - 32 mmol/L 21(L) 19(L) 21(L)  Calcium 8.9 - 10.3 mg/dL 8.8(L) 8.4(L) 8.3(L)      Physical Exam: General: The patient is alert and oriented x3 in no acute distress.   Negative pressure wound vac left in place today. Dressings left intact. Wound vac functioning properly.   ASSESSMENT/PLAN OF CARE S/p partial calcanectomy with placement of antibiotic beads and application of wound vac LT heel. DOS: 11/20/2020 -Wound VAC left in place today.  Patient will need outpatient home wound VAC. -Recommend ID consult.  Their recommendations for outpatient antibiotic regimen will be greatly appreciated -The family is hoping that the patient can be sent to an outpatient rehab facility.  I agree.  Nonweightbearing left lower extremity -Podiatry will follow while inpatient.  From a surgical standpoint the patient is okay to be discharged pending ID recommendations for antibiotic therapy outpatient and arrangements for wound [facility] outpatient.  Wound VAC changes 2x/week.    Please contact me directly with any questions or concerns.  Cell [phone]   Edrick Kins, DPM [facility]  Dr.  Edrick Kins, DPM    [address], [place] [address]                Office [phone]  Fax [phone]

## 2020-11-23 ENCOUNTER — Other Ambulatory Visit: Payer: Medicare Other

## 2020-11-23 DIAGNOSIS — M86272 Subacute osteomyelitis, left ankle and foot: Secondary | ICD-10-CM | POA: Diagnosis not present

## 2020-11-23 LAB — BASIC METABOLIC PANEL
Anion gap: 6 (ref 5–15)
BUN: 30 mg/dL — ABNORMAL HIGH (ref 8–23)
CO2: 22 mmol/L (ref 22–32)
Calcium: 8.5 mg/dL — ABNORMAL LOW (ref 8.9–10.3)
Chloride: 109 mmol/L (ref 98–111)
Creatinine, Ser: 1.15 mg/dL — ABNORMAL HIGH (ref 0.44–1.00)
GFR, Estimated: 50 mL/min — ABNORMAL LOW (ref >60)
Glucose, Bld: 94 mg/dL (ref 70–99)
Potassium: 4.8 mmol/L (ref 3.5–5.1)
Sodium: 137 mmol/L (ref 135–145)

## 2020-11-23 LAB — GLUCOSE, CAPILLARY
Glucose-Capillary: 83 mg/dL (ref 70–99)
Glucose-Capillary: 100 mg/dL — ABNORMAL HIGH (ref 70–99)
Glucose-Capillary: 158 mg/dL — ABNORMAL HIGH (ref 70–99)
Glucose-Capillary: 82 mg/dL (ref 70–99)
Glucose-Capillary: 164 mg/dL — ABNORMAL HIGH (ref 70–99)

## 2020-11-23 LAB — CBC
HCT: 25.1 % — ABNORMAL LOW (ref 36.0–46.0)
Hemoglobin: 7.6 g/dL — ABNORMAL LOW (ref 12.0–15.0)
MCH: 27.5 pg (ref 26.0–34.0)
MCHC: 30.3 g/dL (ref 30.0–36.0)
MCV: 90.9 fL (ref 80.0–100.0)
Platelets: 165 10*3/uL (ref 150–400)
RBC: 2.76 MIL/uL — ABNORMAL LOW (ref 3.87–5.11)
RDW: 16.1 % — ABNORMAL HIGH (ref 11.5–15.5)
WBC: 7 10*3/uL (ref 4.0–10.5)
nRBC: 0 % (ref 0.0–0.2)

## 2020-11-23 LAB — IRON AND TIBC
Iron: 67 ug/dL (ref 28–170)
Saturation Ratios: 35 % — ABNORMAL HIGH (ref 10.4–31.8)
TIBC: 193 ug/dL — ABNORMAL LOW (ref 250–450)
UIBC: 126 ug/dL

## 2020-11-23 LAB — VITAMIN B12: Vitamin B-12: 1375 pg/mL — ABNORMAL HIGH (ref 180–914)

## 2020-11-23 LAB — FERRITIN: Ferritin: 200 ng/mL (ref 11–307)

## 2020-11-23 MED ORDER — INSULIN GLARGINE-YFGN 100 UNIT/ML ~~LOC~~ SOLN
45.0000 [IU] | Freq: Every day | SUBCUTANEOUS | Status: DC
  Administered 2020-11-23: 45 [IU] via SUBCUTANEOUS
  Filled 2020-11-23 (×3): qty 0.45

## 2020-11-23 NOTE — Progress Notes (Signed)
Physical Therapy Treatment Patient Details Name: Andrea [name] MRN: [id number] DOB: 16-Nov-1945 Today's Date: 11/23/2020   History of Present Illness Andrea [name] is a 75 y.o. female with medical history significant for insulin-dependent diabetes mellitus, hypertension, hyperlipidemia, GERD, history of meningitis, CAD, COPD, history of TIA, grade 1 diastolic dysfunction, depression, anxiety, osteoarthritis, who presents to the emergency department from home on 11/17/2020 at the request of her PCP for chief concerns of osteomyelitis of the left heel.  L hip surgery on 08/19/2020. Underwent debridement of heel ulcer with partial calcanectomy and placement of antibiotic beads with application of wound vac left on 11/20/2020. NWB L LE.    PT Comments    Pt seen for PT tx with pt agreeable to session. Pt is able to complete bed mobility with supervision. Pt attempts squat pivot but unable to clear buttocks & maintain NWB LLE so transitioned to stand pivot bed>chair with min assist. Pt is able to transfer from low surfaces with RW & min assist, elevated surfaces with RW & CGA & maintain NWB when transferring from higher surfaces. Pt tolerates standing 3 trials (60 seconds + 60 seconds + 90 seconds) with BUE support & CGA before requiring seated rest break 2/2 fatigue; pt with R knee wobbling/buckling occasionally during task. Pt also performs LLE standing marches for strengthening. Continue to recommend STR upon d/c to maximize independence with functional mobility & reduce fall risk prior to return home. Pt is unsafe to d/c home alone as pt's husband works during the day.     Recommendations for follow up therapy are one component of a multi-disciplinary discharge planning process, led by the attending physician.  Recommendations may be updated based on patient status, additional functional criteria and insurance authorization.  Follow Up Recommendations  SNF;Supervision for mobility/OOB     Equipment  Recommendations  3in1 (PT)    Recommendations for Other Services       Precautions / Restrictions Precautions Precautions: Fall Restrictions Weight Bearing Restrictions: Yes LLE Weight Bearing: Non weight bearing     Mobility  Bed Mobility Overal bed mobility: Needs Assistance Bed Mobility: Supine to Sit     Supine to sit: Supervision;HOB elevated          Transfers Overall transfer level: Needs assistance Equipment used: Rolling walker (2 wheeled) Transfers: Sit to/from [facility] Sit to Stand: Min assist;Min guard Stand pivot transfers: Min assist       General transfer comment: Pt attempts squat pivot bed>recliner but pt unable to clear buttocks from EOB. Pt with more success with sit>stand with RW with cuing for hand placement with pt requiring min assist from low EOB, CGA from elevated recliner. Pt with diffuclty maintaining NWB when transferring from a low surface but improved ability when transferring from elevated recliner seat.  Ambulation/Gait                 Stairs             Wheelchair Mobility    Modified Rankin (Stroke Patients Only)       Balance Overall balance assessment: Needs assistance Sitting-balance support: No upper extremity supported;Feet supported Sitting balance-Leahy Scale: Good     Standing balance support: Bilateral upper extremity supported   Standing balance comment: requries BUE support on RW for static standing                            Cognition Arousal/Alertness: Awake/alert Behavior During Therapy:  WFL for tasks assessed/performed Overall Cognitive Status: Within Functional Limits for tasks assessed                                        Exercises      General Comments        Pertinent Vitals/Pain Pain Assessment: 0-10 Pain Score: 3  Pain Location: LLE with pain increasing with extremity in dependent position, pt declines asking for pain  medicine Pain Descriptors / Indicators: Discomfort Pain Intervention(s): Monitored during session;Limited activity within patient's tolerance;Repositioned    Home Living                      Prior Function            PT Goals (current goals can now be found in the care plan section) Acute Rehab PT Goals Patient Stated Goal: to get better and go home PT Goal Formulation: With patient Time For Goal Achievement: 12/06/20 Potential to Achieve Goals: Fair Progress towards PT goals: Progressing toward goals    Frequency    7X/week      PT Plan Current plan remains appropriate    Co-evaluation              AM-PAC PT "6 Clicks" Mobility   Outcome Measure  Help needed turning from your back to your side while in a flat bed without using bedrails?: A Little Help needed moving from lying on your back to sitting on the side of a flat bed without using bedrails?: A Little Help needed moving to and from a bed to a chair (including a wheelchair)?: A Little Help needed standing up from a chair using your arms (e.g., wheelchair or bedside chair)?: A Little Help needed to walk in hospital room?: Total Help needed climbing 3-5 steps with a railing? : Total 6 Click Score: 14    End of Session   Activity Tolerance: Patient tolerated treatment well;Patient limited by pain;Patient limited by fatigue Patient left: in chair;with chair alarm set;with call bell/phone within reach (wound vac intact) Nurse Communication: Mobility status PT Visit Diagnosis: Unsteadiness on feet (R26.81);Other abnormalities of gait and mobility (R26.89);Muscle weakness (generalized) (M62.81);Difficulty in walking, not elsewhere classified (R26.2)     Time: [id number] PT Time Calculation (min) (ACUTE ONLY): 28 min  Charges:  $Therapeutic Activity: 23-37 mins                     Lavone Nian, PT, DPT 11/23/20, 11:57 AM    Waunita Schooner 11/23/2020, 11:54 AM

## 2020-11-23 NOTE — Progress Notes (Signed)
PODIATRY PROGRESS NOTE  NAME Andrea [name] MRN [id number] DOB 11-28-1945 DOA 11/17/2020   Patient s/p debridement of heel ulcer with partial calcanectomy and placement of antibiotic beads with application of wound vac left. DOS: 11/20/2020. Patient states that over the last 24 hours there was a severe amount of pain however the patient is currently being managed with Dilaudid and she is resting comfortably in bed.  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   Depression    Diabetes mellitus type 2, uncomplicated ([place])    Diabetic retinopathy ([place])    Dupuytren contracture 2011   s/p surgery to LEFT hand   Frequent urinary tract infections    GERD (gastroesophageal reflux disease)    Grade I diastolic dysfunction    HTN (hypertension)    Hyperlipidemia, unspecified    Irritable bowel syndrome    Meningitis 11/2019   MI (myocardial infarction) ([place]) 1994   no stent   Neuromuscular disorder (HCC)    nerve pain in back and legs   Osteoporosis    Pneumonia 2021   RBBB    Sepsis ([place]) 11/2019   Skin cancer    TIA (transient ischemic attack) 2014   Vitamin D deficiency, unspecified    Wheezing     CBC Latest Ref Rng & Units 11/23/2020 11/22/2020 11/21/2020  WBC 4.0 - 10.5 K/uL 7.0 10.0 8.7  Hemoglobin 12.0 - 15.0 g/dL 7.6(L) 8.2(L) 8.7(L)  Hematocrit 36.0 - 46.0 % 25.1(L) 26.8(L) 28.1(L)  Platelets 150 - 400 K/uL 165 174 175    BMP Latest Ref Rng & Units 11/23/2020 11/22/2020 11/21/2020  Glucose 70 - 99 mg/dL 94 209(H) 226(H)  BUN 8 - 23 mg/dL 30(H) 34(H) 38(H)  Creatinine 0.44 - 1.00 mg/dL 1.15(H) 1.19(H) 1.14(H)  BUN/Creat Ratio 12 - 28 - - -  Sodium 135 -  145 mmol/L 137 133(L) 135  Potassium 3.5 - 5.1 mmol/L 4.8 5.0 5.0  Chloride 98 - 111 mmol/L 109 109 113(H)  CO2 22 - 32 mmol/L 22 21(L) 19(L)  Calcium 8.9 - 10.3 mg/dL 8.5(L) 8.8(L) 8.4(L)      Physical Exam: General: The patient is alert and oriented x3 in no acute distress.   Negative pressure wound vac left in place today. Dressings left intact. Wound vac functioning properly.   ASSESSMENT/PLAN OF CARE S/p partial calcanectomy with placement of antibiotic beads and application of wound vac LT heel. DOS: 11/20/2020 - Wound VAC was changed today.  Patient will need outpatient wound VAC changes 2x/week arranged upon discharge.  -Recommend antibiotic management discharge as per recommendations from infectious disease.  Greatly appreciated. -Pending placement to outpatient skilled nursing facility -Nonweightbearing LLE -From a podiatry/surgical standpoint patient okay to be discharged. Will sign off for now.  Follow-up within 2 weeks post discharge in office   Please contact me directly with any questions or concerns.  Cell [phone]   Edrick Kins, DPM [facility]  Dr. Edrick Kins, DPM    [address]  [place], [place] [address]                Office [phone]  Fax [phone]

## 2020-11-23 NOTE — Progress Notes (Signed)
[name] PROGRESS NOTE    Andrea [name]  [id number] DOB: May 20, 1945 DOA: 11/17/2020 PCP: [name], FNP   Brief Narrative:  75 y.o. female with medical history significant for insulin-dependent diabetes mellitus, hypertension, hyperlipidemia, GERD, history of meningitis, CAD, COPD, history of TIA, grade 1 diastolic dysfunction, depression, anxiety, osteoarthritis, who presents to the emergency department from home at the request of her PCP for chief concerns of osteomyelitis of the left heel.   She states that the wound has been there for 3 months and she has taken antibiotics in it and has not resolved.  She states that this started after her orthopedic boot was taken off and she thinks that there might have been a scratch.  She states that the wound has progressively worsened prompting her to present to her PCP.  Her PCP ordered a foot x-ray on 11/13/2020 and resulted today.  She endorses inability to bear weight due to the pain.   Assessment & Plan:   Principal Problem:   Osteomyelitis ([place]) Active Problems:   Chronic obstructive pulmonary disease (HCC)   HLD (hyperlipidemia)   Coronary artery disease involving native coronary artery   PAD (peripheral artery disease) (HCC)   History of meningitis   Status post total hip replacement, left   Cellulitis of left lower extremity  Left heel osteomyelitis No sepsis. Recent ABIs wnl b/l. S/p debridement and partial calcectomy in the OR w/ podiatry on 9/15. Treated here w/ linezolid and cefepime (switched to linezolid from vanc 2/2 aki). Pain well controlled. - f/u cultures - ID consulted for assistance w/ abx choice and duration. Likely 4-6 weeks, likely transition to dapto. If iv abx would need picc prior to d/c - pt/ot consulted, pt interested in snf if qualifies - wound vac in place, will need changed 2x weekly - oxy/dilaudid for pain control. Bowel regimen ordered  Acute kidney injury Poss 2/2 vanc, discontinued 9/15. Also  holding nsaid and arb. Improving, cr 1.15 today - monitor  Anemia, normocytic Baseline hgb 8-10, 7.7 today, no bleeding - ctm, if remains below 8 would transfuse given hx cad - f/u iron studies, b12  Insulin-dependent diabetes mellitus Glucose wnl today, appetite decreased - decrease lantus to 45, continue SSI    Hypertension CAD, history MI BP well controlled Stopped losartan as above - PTA Toprol-XL 25 mg daily  Hyperlipidemia - PTA Lipitor 80 mg daily  Overactive bladder - PTA oxybutynin 20 mg nightly  GERD - PPI   History of meningitis and CSF leak No acute issues  DVT prophylaxis: SQ Lovenox Code Status: Full Family Communication: daughter joy updated telephonically 9/18 Disposition Plan:Status is: inpt  The patient will require care spanning > 2 midnights and should be moved to inpatient because: Inpatient level of care appropriate due to severity of illness  Dispo: The patient is from: Home              Anticipated d/c is to: SNF              Patient currently is not medically stable to d/c.   Difficult to place patient No    Level of care: Med-Surg  Consultants:  Podiatry-Dr. Amalia Hailey  Procedures:  Debridement in OR 9/15  Antimicrobials:  linezolid Cefepime   Subjective: Seen and examined.  Pain controlled. Tolerating diet but appetite poor.  Objective: Vitals:   11/22/20 1729 11/22/20 2051 11/23/20 0359 11/23/20 0731  BP: (!) 125/43 (!) 124/46 (!) 111/46 115/61  Pulse: 88 84 85 87  Resp: '18 16  16  '$ Temp: 98.1 F (36.7 C) 98.2 F (36.8 C) 97.6 F (36.4 C) 97.8 F (36.6 C)  TempSrc:   Oral   SpO2: 100% 99% 98% 100%  Weight:      Height:        Intake/Output Summary (Last 24 hours) at 11/23/2020 1131 Last data filed at 11/23/2020 1015 Gross per 24 hour  Intake 1291.72 ml  Output 800 ml  Net 491.72 ml   Filed Weights   11/17/20 1744 11/20/20 1538  Weight: 62.6 kg 62.6 kg    Examination:  General exam: Appears calm and  comfortable  Respiratory system: Lungs clear.  Normal work of breathing.  Room air Cardiovascular system: S1-S2, RRR, no murmurs, no pedal edema Gastrointestinal system: Abdomen is nondistended, soft and nontender. No organomegaly or masses felt. Normal bowel sounds heard. Central nervous system: Alert and oriented. No focal neurological deficits. Extremities: Left heel full-thickness ulcer with surrounding edema and serosanguineous drainage Skin: lft heel bandaged Psychiatry: Judgement and insight appear normal. Mood & affect appropriate.     Data Reviewed: I have personally reviewed following labs and imaging studies  CBC: Recent Labs  Lab 11/18/20 0503 11/20/20 0247 11/21/20 0420 11/22/20 0433 11/23/20 0513  WBC 7.6 7.0 8.7 10.0 7.0  HGB 9.8* 9.6* 8.7* 8.2* 7.6*  HCT 30.4* 30.5* 28.1* 26.8* 25.1*  MCV 88.1 88.7 88.9 91.2 90.9  PLT 200 183 175 174 [id number]   Basic Metabolic Panel: Recent Labs  Lab 11/18/20 0503 11/20/20 0247 11/21/20 0420 11/22/20 0433 11/23/20 0513  NA 138 136 135 133* 137  K 4.3 4.7 5.0 5.0 4.8  CL 107 109 113* 109 109  CO2 25 21* 19* 21* 22  GLUCOSE 131* 170* 226* 209* 94  BUN 34* 33* 38* 34* 30*  CREATININE 0.77 1.49* 1.14* 1.19* 1.15*  CALCIUM 8.5* 8.3* 8.4* 8.8* 8.5*   GFR: Estimated Creatinine Clearance: 34.9 mL/min (A) (by C-G formula based on SCr of 1.15 mg/dL (H)). Liver Function Tests: No results for input(s): AST, ALT, ALKPHOS, BILITOT, PROT, ALBUMIN in the last 168 hours. No results for input(s): LIPASE, AMYLASE in the last 168 hours. No results for input(s): AMMONIA in the last 168 hours. Coagulation Profile: No results for input(s): INR, PROTIME in the last 168 hours. Cardiac Enzymes: No results for input(s): CKTOTAL, CKMB, CKMBINDEX, TROPONINI in the last 168 hours. BNP (last 3 results) No results for input(s): PROBNP in the last 8760 hours. HbA1C: No results for input(s): HGBA1C in the last 72 hours.  CBG: Recent Labs  Lab  11/22/20 1224 11/22/20 1720 11/22/20 2103 11/23/20 0733 11/23/20 1111  GLUCAP 190* 189* 142* 83 100*   Lipid Profile: No results for input(s): CHOL, HDL, LDLCALC, TRIG, CHOLHDL, LDLDIRECT in the last 72 hours. Thyroid Function Tests: No results for input(s): TSH, T4TOTAL, FREET4, T3FREE, THYROIDAB in the last 72 hours. Anemia Panel: No results for input(s): VITAMINB12, FOLATE, FERRITIN, TIBC, IRON, RETICCTPCT in the last 72 hours. Sepsis Labs: No results for input(s): PROCALCITON, LATICACIDVEN in the last 168 hours.  Recent Results (from the past 240 hour(s))  Resp Panel by RT-PCR (Flu A&B, Covid) Nasopharyngeal Swab     Status: None   Collection Time: 11/17/20  7:45 AM   Specimen: Nasopharyngeal Swab; Nasopharyngeal(NP) swabs in vial transport medium  Result Value Ref Range Status   SARS Coronavirus 2 by RT PCR NEGATIVE NEGATIVE Final    Comment: (NOTE) SARS-CoV-2 target nucleic acids are NOT DETECTED.  The SARS-CoV-2 RNA  is generally detectable in upper respiratory specimens during the acute phase of infection. The lowest concentration of SARS-CoV-2 viral copies this assay can detect is 138 copies/mL. A negative result does not preclude SARS-Cov-2 infection and should not be used as the sole basis for treatment or other patient management decisions. A negative result may occur with  improper specimen collection/handling, submission of specimen other than nasopharyngeal swab, presence of viral mutation(s) within the areas targeted by this assay, and inadequate number of viral copies(<138 copies/mL). A negative result must be combined with clinical observations, patient history, and epidemiological information. The expected result is Negative.  Fact Sheet for Patients:  EntrepreneurPulse.com.au  Fact Sheet for Healthcare Providers:  IncredibleEmployment.be  This test is no t yet approved or cleared by the [place] FDA and  has been  authorized for detection and/or diagnosis of SARS-CoV-2 by FDA under an Emergency Use Authorization (EUA). This EUA will remain  in effect (meaning this test can be used) for the duration of the COVID-19 declaration under Section 564(b)(1) of the Act, 21 U.S.C.section 360bbb-3(b)(1), unless the authorization is terminated  or revoked sooner.       Influenza A by PCR NEGATIVE NEGATIVE Final   Influenza B by PCR NEGATIVE NEGATIVE Final    Comment: (NOTE) The Xpert Xpress SARS-CoV-2/FLU/RSV plus assay is intended as an aid in the diagnosis of influenza from Nasopharyngeal swab specimens and should not be used as a sole basis for treatment. Nasal washings and aspirates are unacceptable for Xpert Xpress SARS-CoV-2/FLU/RSV testing.  Fact Sheet for Patients: EntrepreneurPulse.com.au  Fact Sheet for Healthcare Providers: IncredibleEmployment.be  This test is not yet approved or cleared by the [place] FDA and has been authorized for detection and/or diagnosis of SARS-CoV-2 by FDA under an Emergency Use Authorization (EUA). This EUA will remain in effect (meaning this test can be used) for the duration of the COVID-19 declaration under Section 564(b)(1) of the Act, 21 U.S.C. section 360bbb-3(b)(1), unless the authorization is terminated or revoked.  Performed at [facility], [address] [place], [place] [address]   CULTURE, BLOOD (ROUTINE X 2) w Reflex to ID Panel     Status: None   Collection Time: 11/17/20 10:34 PM   Specimen: BLOOD  Result Value Ref Range Status   Specimen Description BLOOD LEFT FOREARM  Final   Special Requests   Final    BOTTLES DRAWN AEROBIC AND ANAEROBIC Blood Culture adequate volume   Culture   Final    NO GROWTH 5 DAYS Performed at [facility], [address] [place], [place] [address]    Report Status 11/22/2020 FINAL  Final  CULTURE, BLOOD (ROUTINE X 2) w Reflex to ID Panel     Status:  None   Collection Time: 11/17/20 10:35 PM   Specimen: BLOOD  Result Value Ref Range Status   Specimen Description BLOOD RIGHT FOREARM  Final   Special Requests   Final    BOTTLES DRAWN AEROBIC ONLY Blood Culture adequate volume   Culture   Final    NO GROWTH 5 DAYS Performed at [facility], [address], [place], [place] [address]    Report Status 11/22/2020 FINAL  Final  Surgical pcr screen     Status: None   Collection Time: 11/19/20 12:41 PM   Specimen: Nasal Mucosa; Nasal Swab  Result Value Ref Range Status   MRSA, PCR NEGATIVE NEGATIVE Final   Staphylococcus aureus NEGATIVE NEGATIVE Final    Comment: (NOTE) The Xpert  SA Assay (FDA approved for NASAL specimens in patients 5 years of age and older), is one component of a comprehensive surveillance program. It is not intended to diagnose infection nor to guide or monitor treatment. Performed at [facility], [place], [place] [address]   Aerobic/Anaerobic Culture w Gram Stain (surgical/deep wound)     Status: None (Preliminary result)   Collection Time: 11/20/20  5:08 PM   Specimen: PATH Bone biopsy; Tissue  Result Value Ref Range Status   Specimen Description   Final    HEEL Performed at [facility], [address], [place], [place] [address]    Special Requests   Final    LEFT HEEL BONE CULTURE Performed at [facility], [address] [place], [place] [address]    Gram Stain   Final    NO WBC SEEN RARE GRAM NEGATIVE RODS Performed at [facility], [place] [address] [place], [place] [address]    Culture   Final    RARE PSEUDOMONAS AERUGINOSA NO ANAEROBES ISOLATED; CULTURE IN PROGRESS FOR 5 DAYS    Report Status PENDING  Incomplete  Aerobic/Anaerobic Culture w Gram Stain (surgical/deep wound)     Status: None (Preliminary result)   Collection Time: 11/20/20  5:08 PM   Specimen: PATH Bone biopsy; Tissue  Result Value Ref Range Status   Specimen  Description   Final    WOUND Performed at [facility], [address] [place], [place] [address]    Special Requests   Final    LEFT HEEL WD CULTURE Performed at [facility], [place], [place] [address]    Gram Stain NO WBC SEEN NO ORGANISMS SEEN   Final   Culture   Final    NO GROWTH 2 DAYS NO ANAEROBES ISOLATED; CULTURE IN PROGRESS FOR 5 DAYS Performed at [facility] [address] [place], [place] [address]    Report Status PENDING  Incomplete         Radiology Studies: No results found.      Scheduled Meds:  acetaminophen  1,000 mg Oral TID   atorvastatin  80 mg Oral QHS   enoxaparin (LOVENOX) injection  40 mg Subcutaneous Q24H   insulin aspart  0-15 Units Subcutaneous TID WC   insulin aspart  0-5 Units Subcutaneous QHS   insulin glargine-yfgn  50 Units Subcutaneous QHS   metoprolol succinate  25 mg Oral Daily   oxybutynin  10 mg Oral QHS   pantoprazole  80 mg Oral QAC breakfast   polyethylene glycol  17 g Oral Daily   senna  1 tablet Oral Daily   vitamin B-12  1,000 mcg Oral Daily   Continuous Infusions:  sodium chloride 1,000 mL (11/21/20 2136)   ceFEPime (MAXIPIME) IV 2 g (11/23/20 0957)   linezolid (ZYVOX) IV 600 mg (11/23/20 1119)     LOS: 5 days    Time spent: 20 minutes    Desma Maxim, MD [facility]  If 7PM-7AM, please contact night-coverage 11/23/2020, 11:31 AM

## 2020-11-24 ENCOUNTER — Inpatient Hospital Stay: Payer: Self-pay

## 2020-11-24 ENCOUNTER — Inpatient Hospital Stay: Payer: Medicare Other

## 2020-11-24 ENCOUNTER — Telehealth: Payer: Self-pay

## 2020-11-24 DIAGNOSIS — M869 Osteomyelitis, unspecified: Secondary | ICD-10-CM

## 2020-11-24 DIAGNOSIS — M86272 Subacute osteomyelitis, left ankle and foot: Secondary | ICD-10-CM | POA: Diagnosis not present

## 2020-11-24 LAB — BASIC METABOLIC PANEL
Anion gap: 1 — ABNORMAL LOW (ref 5–15)
BUN: 25 mg/dL — ABNORMAL HIGH (ref 8–23)
CO2: 21 mmol/L — ABNORMAL LOW (ref 22–32)
Calcium: 9.1 mg/dL (ref 8.9–10.3)
Chloride: 113 mmol/L — ABNORMAL HIGH (ref 98–111)
Creatinine, Ser: 1.06 mg/dL — ABNORMAL HIGH (ref 0.44–1.00)
GFR, Estimated: 55 mL/min — ABNORMAL LOW (ref >60)
Glucose, Bld: 99 mg/dL (ref 70–99)
Potassium: 5 mmol/L (ref 3.5–5.1)
Sodium: 135 mmol/L (ref 135–145)

## 2020-11-24 LAB — CBC
HCT: 26.7 % — ABNORMAL LOW (ref 36.0–46.0)
Hemoglobin: 8.2 g/dL — ABNORMAL LOW (ref 12.0–15.0)
MCH: 27.7 pg (ref 26.0–34.0)
MCHC: 30.7 g/dL (ref 30.0–36.0)
MCV: 90.2 fL (ref 80.0–100.0)
Platelets: 198 10*3/uL (ref 150–400)
RBC: 2.96 MIL/uL — ABNORMAL LOW (ref 3.87–5.11)
RDW: 15.9 % — ABNORMAL HIGH (ref 11.5–15.5)
WBC: 7.6 10*3/uL (ref 4.0–10.5)
nRBC: 0 % (ref 0.0–0.2)

## 2020-11-24 LAB — CK: Total CK: 20 U/L — ABNORMAL LOW (ref 38–234)

## 2020-11-24 LAB — GLUCOSE, CAPILLARY
Glucose-Capillary: 83 mg/dL (ref 70–99)
Glucose-Capillary: 155 mg/dL — ABNORMAL HIGH (ref 70–99)
Glucose-Capillary: 76 mg/dL (ref 70–99)
Glucose-Capillary: 61 mg/dL — ABNORMAL LOW (ref 70–99)
Glucose-Capillary: 93 mg/dL (ref 70–99)

## 2020-11-24 MED ORDER — SODIUM CHLORIDE 0.9 % IV SOLN
6.0000 mg/kg | Freq: Every day | INTRAVENOUS | Status: DC
  Administered 2020-11-24 – 2020-11-30 (×7): 400 mg via INTRAVENOUS
  Filled 2020-11-24 (×8): qty 8

## 2020-11-24 MED ORDER — SODIUM CHLORIDE 0.9% FLUSH: 10.0000 mL | INTRAVENOUS | Status: DC | PRN

## 2020-11-24 MED ORDER — CHLORHEXIDINE GLUCONATE CLOTH 2 % EX PADS
6.0000 | MEDICATED_PAD | Freq: Every day | CUTANEOUS | Status: DC
  Administered 2020-11-24 – 2020-11-30 (×7): 6 via TOPICAL

## 2020-11-24 MED ORDER — ONDANSETRON HCL 4 MG PO TABS: 4.0000 mg | ORAL_TABLET | Freq: Three times a day (TID) | ORAL | Status: DC

## 2020-11-24 MED ORDER — ONDANSETRON HCL 4 MG/2ML IJ SOLN
4.0000 mg | Freq: Three times a day (TID) | INTRAMUSCULAR | Status: DC
  Administered 2020-11-24: 4 mg via INTRAVENOUS
  Filled 2020-11-24: qty 2

## 2020-11-24 MED ORDER — SODIUM CHLORIDE 0.9% FLUSH
10.0000 mL | Freq: Two times a day (BID) | INTRAVENOUS | Status: DC
  Administered 2020-11-24 – 2020-12-01 (×14): 10 mL

## 2020-11-24 NOTE — Care Management Important Message (Signed)
Important Message  Patient Details  Name: Andrea [name] MRN: [id number] Date of Birth: 11-Oct-1945   Medicare Important Message Given:  Yes     [name] 11/24/2020, 12:15 PM

## 2020-11-24 NOTE — Progress Notes (Signed)
   Date of Admission:  11/17/2020     ID: Andrea [name] is a 75 y.o. female  Principal Problem:   Osteomyelitis ([place]) Active Problems:   Chronic obstructive pulmonary disease (HCC)   HLD (hyperlipidemia)   Coronary artery disease involving native coronary artery   PAD (peripheral artery disease) (HCC)   History of meningitis   Status post total hip replacement, left   Cellulitis of left lower extremity    Subjective: C/o nausea and poor appetite No fever Pain left foot  Medications:   acetaminophen  1,000 mg Oral TID   atorvastatin  80 mg Oral QHS   enoxaparin (LOVENOX) injection  40 mg Subcutaneous Q24H   insulin aspart  0-15 Units Subcutaneous TID WC   insulin aspart  0-5 Units Subcutaneous QHS   insulin glargine-yfgn  45 Units Subcutaneous QHS   metoprolol succinate  25 mg Oral Daily   oxybutynin  10 mg Oral QHS   pantoprazole  80 mg Oral QAC breakfast   polyethylene glycol  17 g Oral Daily   senna  1 tablet Oral Daily   vitamin B-12  1,000 mcg Oral Daily    Objective: Vital signs in last 24 hours: Temp:  [97.6 F (36.4 C)-98.4 F (36.9 C)] 98.3 F (36.8 C) (09/19 0750) Pulse Rate:  [79-93] 93 (09/19 0750) Resp:  [16-20] 16 (09/19 0750) BP: (112-127)/(37-51) 114/51 (09/19 0750) SpO2:  [97 %-100 %] 100 % (09/19 0750)  PHYSICAL EXAM:  General: Alert, cooperative, no distress, appears stated age.  Head: Normocephalic, without obvious abnormality, atraumatic. Eyes: Conjunctivae clear, anicteric sclerae. Pupils are equal ENT Nares normal. No drainage or sinus tenderness. Lips, mucosa, and tongue normal. No Thrush Neck: Supple, symmetrical, no adenopathy, thyroid: non tender no carotid bruit and no JVD. Back: No CVA tenderness. Lungs: Clear to auscultation bilaterally. No Wheezing or Rhonchi. No rales. Heart: Regular rate and rhythm, no murmur, rub or gallop. Abdomen: Soft, non-tender,not distended. Bowel sounds normal. No masses Extremities: left foot  -wound with vac Skin: No rashes or lesions. Or bruising Lymph: Cervical, supraclavicular normal. Neurologic: Grossly non-focal  Lab Results Recent Labs    11/23/20 0513 11/24/20 0403  WBC 7.0 7.6  HGB 7.6* 8.2*  HCT 25.1* 26.7*  NA 137 135  K 4.8 5.0  CL 109 113*  CO2 22 21*  BUN 30* 25*  CREATININE 1.15* 1.06*     Microbiology: Pseudomonas in bone culture Studies/Results: No results found.   Assessment/Plan: Left heel ulcer with osteomyelitis- s/p I/D and partial calcanectomy. Pseudomonas in culture Pt was on doxy+ keflex as OP- so the culture imay not have grown staph Would cover with cefepime and MRSA coverage with daptomycin- On atorvastatin which will have to be reduced to a minimum of 20mg  from 80mg  because of risk for rhabdo with dapto- Will discuss with her daughter  Recent Left hip THA Dm-on insulin H/o  csf rhinorrhea /strep meningitis - treated with antibiotics and underwent repair of [facility] leak at [facility] That has  resolved  Discussed the management with patient , will discus with her daughter

## 2020-11-24 NOTE — Progress Notes (Signed)
Pharmacy Antibiotic Note  Andrea [name] is a 75 y.o. female admitted on 11/17/2020 with Osteomyelitis of the left heel. S/P I&D and bone biopsy 9/15. Patient was seeing podiatry outpatient, and failed treatment on doxycycline and Keflex. Patient was started on Vancomycin and Cefepime on admission. On 9/14 vancomycin was changed to linezolid per ID.   Pharmacy now consulted for daptomycin dosing.Patient is expected to receive 4-6 weeks of antibiotics.   9/19 CK: 20  Plan: Will start daptomycin '6mg'$ /kg every 6 hours. Will monitor CrCl and adjust dose as needed. CK checks weekly per protocol.   Patient was receiving atorvastatin '80mg'$  daily. Concurrent use of DAPTOMYCIN and STATINS may result in increased risk of myopathy or rhabdomyolysis. Atorvastatin has been discontinued at this time.   Continue cefepime 2g IV every 12 hours.    Height: 5' (152.4 cm) Weight: 62.6 kg (138 lb 0.1 oz) IBW/kg (Calculated) : 45.5  Temp (24hrs), Avg:98.1 F (36.7 C), Min:97.6 F (36.4 C), Max:98.4 F (36.9 C)  Recent Labs  Lab 11/20/20 0247 11/21/20 0420 11/22/20 0433 11/23/20 0513 11/24/20 0403  WBC 7.0 8.7 10.0 7.0 7.6  CREATININE 1.49* 1.14* 1.19* 1.15* 1.06*    Estimated Creatinine Clearance: 37.9 mL/min (A) (by C-G formula based on SCr of 1.06 mg/dL (H)).    Allergies  Allergen Reactions   Metformin And Related Diarrhea   Sulfa Antibiotics Rash    Antimicrobials this admission: 9/13 Cefepime>> 9/13 Vancomycin>>9/15 9/16 Linezolid>> 9/19 9/19 Daptomycin >>   Microbiology results: 9/12 BCx: NG Final  9/15 Heel biopsy: Pseudomonas aeruginosa  9/14 MRSA PCR: negative   Thank you for allowing pharmacy to be a part of this patient's care.  Pernell Dupre, PharmD, BCPS Clinical Pharmacist 11/24/2020 2:22 PM

## 2020-11-24 NOTE — TOC Progression Note (Addendum)
Transition of Care [facility]) - Progression Note    Patient Details  Name: Andrea [name] MRN: [id number] Date of Birth: 04/27/1945  [facility] CM/SW Contact  Su Hilt, RN Phone Number: 11/24/2020, 9:03 AM  Clinical Narrative:   There are no Bed offers in the Hub this morning, Resent the bed search out asking for review and potential bed offer, will review bed offers once obtained  250 PM, Still no bed offers       Expected Discharge Plan and Services                                                 Social Determinants of Health (SDOH) Interventions    Readmission Risk Interventions Readmission Risk Prevention Plan 08/21/2020  Transportation Screening Complete  PCP or Specialist Appt within 5-7 Days Complete  Home Care Screening Complete  Medication Review (RN CM) Complete  Some recent data might be hidden

## 2020-11-24 NOTE — Progress Notes (Signed)
[name] PROGRESS NOTE    Andrea [name]  [id number] DOB: [birth year] DOA: 11/17/2020 PCP: [name], FNP   Brief Narrative:  75 y.o. female with medical history significant for insulin-dependent diabetes mellitus, hypertension, hyperlipidemia, GERD, history of meningitis, CAD, COPD, history of TIA, grade 1 diastolic dysfunction, depression, anxiety, osteoarthritis, who presents to the emergency department from home at the request of her PCP for chief concerns of osteomyelitis of the left heel.   She states that the wound has been there for 3 months and she has taken antibiotics in it and has not resolved.  She states that this started after her orthopedic boot was taken off and she thinks that there might have been a scratch.  She states that the wound has progressively worsened prompting her to present to her PCP.  Her PCP ordered a foot x-ray on 11/13/2020 and resulted today.  She endorses inability to bear weight due to the pain.   Assessment & Plan:   Principal Problem:   Osteomyelitis ([place]) Active Problems:   Chronic obstructive pulmonary disease (HCC)   HLD (hyperlipidemia)   Coronary artery disease involving native coronary artery   PAD (peripheral artery disease) (HCC)   History of meningitis   Status post total hip replacement, left   Cellulitis of left lower extremity  Left heel osteomyelitis No sepsis. Recent ABIs wnl b/l. S/p debridement and partial calcectomy in the OR w/ podiatry on 9/15. Treated here w/ linezolid and cefepime (switched to linezolid from vanc 2/2 aki). Pain well controlled. - f/u cultures - ID consulted for assistance w/ abx choice and duration. Likely 4-6 weeks, likely transition to dapto. If iv abx would need picc prior to d/c - snf bed search underway - wound vac in place, will need changed 2x weekly - f/u Dr. Amalia Hailey podiatry 2 wks - nonweightbearing LLE - oxy/dilaudid for pain control. Bowel regimen ordered  Nausea Likely med side effect. Is  having bowel movements, no pain, no vomiting. - scheduled zofran  Acute kidney injury Poss 2/2 vanc, discontinued 9/15. Also holding nsaid and arb. Improving, cr 1.06 today - monitor  Anemia, normocytic Baseline hgb 8-10, 8.2 today, no bleeding. Iron studies suggestive of chronic disease. B12 wnl - ctm, if remains below 8 would transfuse given hx cad  Insulin-dependent diabetes mellitus Glucose wnl today, appetite decreased - lantus 45, continue SSI    Hypertension CAD, history MI BP well controlled Stopped losartan as above - PTA Toprol-XL 25 mg daily  Hyperlipidemia - PTA Lipitor 80 mg daily  Overactive bladder - PTA oxybutynin 20 mg nightly  GERD - PPI   History of meningitis and CSF leak No acute issues  DVT prophylaxis: SQ Lovenox Code Status: Full Family Communication: daughter updated @ bedside 9/19 Disposition Plan:Status is: inpt  The patient will require care spanning > 2 midnights and should be moved to inpatient because: Inpatient level of care appropriate due to severity of illness  Dispo: The patient is from: Home              Anticipated d/c is to: SNF              Patient currently is not medically stable to d/c.   Difficult to place patient No    Level of care: Med-Surg  Consultants:  Podiatry-Dr. Amalia Hailey  Procedures:  Debridement in OR 9/15  Antimicrobials:  linezolid Cefepime   Subjective: Seen and examined.  Pain controlled. Tolerating diet but appetite poor.  Objective: Vitals:  11/23/20 1718 11/23/20 2151 11/24/20 0427 11/24/20 0750  BP: (!) 112/44 (!) 115/37 (!) 127/51 (!) 114/51  Pulse: 79 82 93 93  Resp: '17 16 20 16  '$ Temp: 98.4 F (36.9 C) 98 F (36.7 C) 97.6 F (36.4 C) 98.3 F (36.8 C)  TempSrc:  Oral Oral   SpO2: 100% 100% 97% 100%  Weight:      Height:        Intake/Output Summary (Last 24 hours) at 11/24/2020 1251 Last data filed at 11/24/2020 0257 Gross per 24 hour  Intake 784.66 ml  Output --  Net 784.66  ml   Filed Weights   11/17/20 1744 11/20/20 1538  Weight: 62.6 kg 62.6 kg    Examination:  General exam: Appears calm and comfortable  Respiratory system: Lungs clear.  Normal work of breathing.  Room air Cardiovascular system: S1-S2, RRR, no murmurs, no pedal edema Gastrointestinal system: Abdomen is nondistended, soft and nontender. No organomegaly or masses felt. Normal bowel sounds heard. Central nervous system: Alert and oriented. No focal neurological deficits. Extremities: Left heel full-thickness ulcer with surrounding edema and serosanguineous drainage Skin: lft heel bandaged Psychiatry: Judgement and insight appear normal. Mood & affect appropriate.     Data Reviewed: I have personally reviewed following labs and imaging studies  CBC: Recent Labs  Lab 11/20/20 0247 11/21/20 0420 11/22/20 0433 11/23/20 0513 11/24/20 0403  WBC 7.0 8.7 10.0 7.0 7.6  HGB 9.6* 8.7* 8.2* 7.6* 8.2*  HCT 30.5* 28.1* 26.8* 25.1* 26.7*  MCV 88.7 88.9 91.2 90.9 90.2  PLT 183 175 174 165 [id number]   Basic Metabolic Panel: Recent Labs  Lab 11/20/20 0247 11/21/20 0420 11/22/20 0433 11/23/20 0513 11/24/20 0403  NA 136 135 133* 137 135  K 4.7 5.0 5.0 4.8 5.0  CL 109 113* 109 109 113*  CO2 21* 19* 21* 22 21*  GLUCOSE 170* 226* 209* 94 99  BUN 33* 38* 34* 30* 25*  CREATININE 1.49* 1.14* 1.19* 1.15* 1.06*  CALCIUM 8.3* 8.4* 8.8* 8.5* 9.1   GFR: Estimated Creatinine Clearance: 37.9 mL/min (A) (by C-G formula based on SCr of 1.06 mg/dL (H)). Liver Function Tests: No results for input(s): AST, ALT, ALKPHOS, BILITOT, PROT, ALBUMIN in the last 168 hours. No results for input(s): LIPASE, AMYLASE in the last 168 hours. No results for input(s): AMMONIA in the last 168 hours. Coagulation Profile: No results for input(s): INR, PROTIME in the last 168 hours. Cardiac Enzymes: No results for input(s): CKTOTAL, CKMB, CKMBINDEX, TROPONINI in the last 168 hours. BNP (last 3 results) No results for  input(s): PROBNP in the last 8760 hours. HbA1C: No results for input(s): HGBA1C in the last 72 hours.  CBG: Recent Labs  Lab 11/23/20 1227 11/23/20 1714 11/23/20 2156 11/24/20 0751 11/24/20 1205  GLUCAP 158* 82 164* 83 155*   Lipid Profile: No results for input(s): CHOL, HDL, LDLCALC, TRIG, CHOLHDL, LDLDIRECT in the last 72 hours. Thyroid Function Tests: No results for input(s): TSH, T4TOTAL, FREET4, T3FREE, THYROIDAB in the last 72 hours. Anemia Panel: Recent Labs    11/23/20 1213  VITAMINB12 1,375*  FERRITIN 200  TIBC 193*  IRON 67   Sepsis Labs: No results for input(s): PROCALCITON, LATICACIDVEN in the last 168 hours.  Recent Results (from the past 240 hour(s))  Resp Panel by RT-PCR (Flu A&B, Covid) Nasopharyngeal Swab     Status: None   Collection Time: 11/17/20  7:45 AM   Specimen: Nasopharyngeal Swab; Nasopharyngeal(NP) swabs in vial transport medium  Result Value  Ref Range Status   SARS Coronavirus 2 by RT PCR NEGATIVE NEGATIVE Final    Comment: (NOTE) SARS-CoV-2 target nucleic acids are NOT DETECTED.  The SARS-CoV-2 RNA is generally detectable in upper respiratory specimens during the acute phase of infection. The lowest concentration of SARS-CoV-2 viral copies this assay can detect is 138 copies/mL. A negative result does not preclude SARS-Cov-2 infection and should not be used as the sole basis for treatment or other patient management decisions. A negative result may occur with  improper specimen collection/handling, submission of specimen other than nasopharyngeal swab, presence of viral mutation(s) within the areas targeted by this assay, and inadequate number of viral copies(<138 copies/mL). A negative result must be combined with clinical observations, patient history, and epidemiological information. The expected result is Negative.  Fact Sheet for Patients:  EntrepreneurPulse.com.au  Fact Sheet for Healthcare Providers:   IncredibleEmployment.be  This test is no t yet approved or cleared by the [place] FDA and  has been authorized for detection and/or diagnosis of SARS-CoV-2 by FDA under an Emergency Use Authorization (EUA). This EUA will remain  in effect (meaning this test can be used) for the duration of the COVID-19 declaration under Section 564(b)(1) of the Act, 21 U.S.C.section 360bbb-3(b)(1), unless the authorization is terminated  or revoked sooner.       Influenza A by PCR NEGATIVE NEGATIVE Final   Influenza B by PCR NEGATIVE NEGATIVE Final    Comment: (NOTE) The Xpert Xpress SARS-CoV-2/FLU/RSV plus assay is intended as an aid in the diagnosis of influenza from Nasopharyngeal swab specimens and should not be used as a sole basis for treatment. Nasal washings and aspirates are unacceptable for Xpert Xpress SARS-CoV-2/FLU/RSV testing.  Fact Sheet for Patients: EntrepreneurPulse.com.au  Fact Sheet for Healthcare Providers: IncredibleEmployment.be  This test is not yet approved or cleared by the [place] FDA and has been authorized for detection and/or diagnosis of SARS-CoV-2 by FDA under an Emergency Use Authorization (EUA). This EUA will remain in effect (meaning this test can be used) for the duration of the COVID-19 declaration under Section 564(b)(1) of the Act, 21 U.S.C. section 360bbb-3(b)(1), unless the authorization is terminated or revoked.  Performed at [facility], [address] [place], [place] [address]   CULTURE, BLOOD (ROUTINE X 2) w Reflex to ID Panel     Status: None   Collection Time: 11/17/20 10:34 PM   Specimen: BLOOD  Result Value Ref Range Status   Specimen Description BLOOD LEFT FOREARM  Final   Special Requests   Final    BOTTLES DRAWN AEROBIC AND ANAEROBIC Blood Culture adequate volume   Culture   Final    NO GROWTH 5 DAYS Performed at [facility], [address] [place], [place] [address]    Report Status 11/22/2020 FINAL  Final  CULTURE, BLOOD (ROUTINE X 2) w Reflex to ID Panel     Status: None   Collection Time: 11/17/20 10:35 PM   Specimen: BLOOD  Result Value Ref Range Status   Specimen Description BLOOD RIGHT FOREARM  Final   Special Requests   Final    BOTTLES DRAWN AEROBIC ONLY Blood Culture adequate volume   Culture   Final    NO GROWTH 5 DAYS Performed at [facility], [address] [place],  [address]    Report Status 11/22/2020 FINAL  Final  Surgical pcr screen     Status: None   Collection Time: 11/19/20 12:41 PM   Specimen: Nasal  Mucosa; Nasal Swab  Result Value Ref Range Status   MRSA, PCR NEGATIVE NEGATIVE Final   Staphylococcus aureus NEGATIVE NEGATIVE Final    Comment: (NOTE) The Xpert SA Assay (FDA approved for NASAL specimens in patients 72 years of age and older), is one component of a comprehensive surveillance program. It is not intended to diagnose infection nor to guide or monitor treatment. Performed at [facility], [place], [place] [address]   Aerobic/Anaerobic Culture w Gram Stain (surgical/deep wound)     Status: None (Preliminary result)   Collection Time: 11/20/20  5:08 PM   Specimen: PATH Bone biopsy; Tissue  Result Value Ref Range Status   Specimen Description HEEL  Final   Special Requests LEFT HEEL BONE CULTURE  Final   Gram Stain NO WBC SEEN RARE GRAM NEGATIVE RODS   Final   Culture   Final    RARE PSEUDOMONAS AERUGINOSA NO ANAEROBES ISOLATED; CULTURE IN PROGRESS FOR 5 DAYS    Report Status PENDING  Incomplete   Organism ID, Bacteria PSEUDOMONAS AERUGINOSA  Final      Susceptibility   Pseudomonas aeruginosa - MIC*    CEFTAZIDIME 4 SENSITIVE Sensitive     CIPROFLOXACIN <=0.25 SENSITIVE Sensitive     GENTAMICIN <=1 SENSITIVE Sensitive     IMIPENEM 2 SENSITIVE Sensitive     PIP/TAZO 16 SENSITIVE Sensitive     CEFEPIME Value in next row Sensitive       4 SENSITIVEPerformed at [facility] [address] [place], [place] [address]    * RARE PSEUDOMONAS AERUGINOSA  Aerobic/Anaerobic Culture w Gram Stain (surgical/deep wound)     Status: None (Preliminary result)   Collection Time: 11/20/20  5:08 PM   Specimen: PATH Bone biopsy; Tissue  Result Value Ref Range Status   Specimen Description   Final    WOUND Performed at [facility], [address] [place], [place] [address]    Special Requests   Final    LEFT HEEL WD CULTURE Performed at [facility], [place], [place] [address]    Gram Stain NO WBC SEEN NO ORGANISMS SEEN   Final   Culture   Final    NO GROWTH 4 DAYS NO ANAEROBES ISOLATED; CULTURE IN PROGRESS FOR 5 DAYS Performed at [facility] [address], [place], [place] [address]    Report Status PENDING  Incomplete         Radiology Studies: No results found.      Scheduled Meds:  acetaminophen  1,000 mg Oral TID   atorvastatin  80 mg Oral QHS   enoxaparin (LOVENOX) injection  40 mg Subcutaneous Q24H   insulin aspart  0-15 Units Subcutaneous TID WC   insulin aspart  0-5 Units Subcutaneous QHS   insulin glargine-yfgn  45 Units Subcutaneous QHS   metoprolol succinate  25 mg Oral Daily   oxybutynin  10 mg Oral QHS   pantoprazole  80 mg Oral QAC breakfast   polyethylene glycol  17 g Oral Daily   senna  1 tablet Oral Daily   vitamin B-12  1,000 mcg Oral Daily   Continuous Infusions:  sodium chloride 1,000 mL (11/21/20 2136)   ceFEPime (MAXIPIME) IV 2 g (11/24/20 0957)   linezolid (ZYVOX) IV 600 mg (11/24/20 1130)     LOS: 6 days    Time spent: 20 minutes    Desma Maxim, MD [facility]  If 7PM-7AM, please contact night-coverage  11/24/2020, 12:51 PM

## 2020-11-24 NOTE — Telephone Encounter (Signed)
   Telephone encounter was:  Successful.  11/24/2020 Name: Andrea [name] MRN: [id number] DOB: 05-13-1945  Andrea [name] is a 75 y.o. year old female who is a primary care patient of [name], FNP . The community resource team was consulted for assistance with Transportation Needs   Care guide performed the following interventions:  Spoke to daughter Caryl Asp again as pt is still in the Hospital. Her appts were cancelled for Septemeber. I will be sending daughter [facility] transportation information and close out the request as daughter has provided her e-mail to me. Daughter believes mom may be hospitalized for a few more days/possible weeks.  Follow Up Plan:  No further follow up planned at this time. The patient has been provided with needed resources.  [facility]  [place], [place]  Main Phone: [phone]  E-mail: [email or web address]  Website: [email or web address]

## 2020-11-24 NOTE — Progress Notes (Signed)
Physical Therapy Treatment Patient Details Name: Andrea [name] MRN: [id number] DOB: 09/23/1945 Today's Date: 11/24/2020   History of Present Illness Andrea [name] is a 75 y.o. female with medical history significant for insulin-dependent diabetes mellitus, hypertension, hyperlipidemia, GERD, history of meningitis, CAD, COPD, history of TIA, grade 1 diastolic dysfunction, depression, anxiety, osteoarthritis, who presents to the emergency department from home on 11/17/2020 at the request of her PCP for chief concerns of osteomyelitis of the left heel.  L hip surgery on 08/19/2020. Underwent debridement of heel ulcer with partial calcanectomy and placement of antibiotic beads with application of wound vac left on 11/20/2020. NWB L LE.    PT Comments    Pt seen for PT tx with pt agreeable but limited 2/2 increased L heel pain - meds requested during session. Pt continues to require min assist for transfers from EOB with RW with questionable ability for pt to maintain NWB LLE throughout transfer. Pt also with difficulty clearing RLE during stand pivot & instead shuffles foot across floor. Pt performs seated exercises vs standing 2/2 pain on this date.     Recommendations for follow up therapy are one component of a multi-disciplinary discharge planning process, led by the attending physician.  Recommendations may be updated based on patient status, additional functional criteria and insurance authorization.  Follow Up Recommendations  SNF;Supervision for mobility/OOB     Equipment Recommendations  3in1 (PT)    Recommendations for Other Services       Precautions / Restrictions Precautions Precautions: Fall Restrictions Weight Bearing Restrictions: Yes LLE Weight Bearing: Non weight bearing     Mobility  Bed Mobility Overal bed mobility: Modified Independent Bed Mobility: Supine to Sit     Supine to sit: Modified independent (Device/Increase time);HOB elevated     General bed mobility  comments: use of bed rails    Transfers Overall transfer level: Needs assistance Equipment used: Rolling walker (2 wheeled) Transfers: Sit to/from [facility] Sit to Stand: Min assist Stand pivot transfers: Min assist       General transfer comment: Multiple attempts for sit>stand from EOB with pt slightly touching floor with LLE. Pt completes stand pivot bed>recliner with min assist with pt scooting/shuffling RLE across floor but unable to hop on RLE.  Ambulation/Gait                 Stairs             Wheelchair Mobility    Modified [place] (Stroke Patients Only)       Balance Overall balance assessment: Needs assistance Sitting-balance support: No upper extremity supported;Feet supported Sitting balance-Leahy Scale: Good     Standing balance support: Bilateral upper extremity supported Standing balance-Leahy Scale: Poor Standing balance comment: BUE support on RW with min assist                            Cognition Arousal/Alertness: Awake/alert Behavior During Therapy: WFL for tasks assessed/performed Overall Cognitive Status: Within Functional Limits for tasks assessed                                        Exercises General Exercises - Lower Extremity Long Arc Quad: AROM;Strengthening;Both;10 reps;Seated Hip ABduction/ADduction: AROM;Strengthening;Both;10 reps;Seated (hip adduction pillow squeezes) Hip Flexion/Marching: AROM;Strengthening;Both;10 reps;Seated    General Comments General comments (skin integrity, edema, etc.): Pt declines standing exercises  2/2 increased pain in LLE.      Pertinent Vitals/Pain Pain Assessment: 0-10 Pain Score: 6  Pain Location: L heel, increasing with LE in dependent position Pain Descriptors / Indicators: Discomfort;Aching Pain Intervention(s): Monitored during session;Limited activity within patient's tolerance;Patient requesting pain meds-RN notified    Home  Living                      Prior Function            PT Goals (current goals can now be found in the care plan section) Acute Rehab PT Goals Patient Stated Goal: decreased pain PT Goal Formulation: With patient Time For Goal Achievement: 12/06/20 Potential to Achieve Goals: Fair Progress towards PT goals: Progressing toward goals    Frequency    7X/week      PT Plan Current plan remains appropriate    Co-evaluation              AM-PAC PT "6 Clicks" Mobility   Outcome Measure  Help needed turning from your back to your side while in a flat bed without using bedrails?: None Help needed moving from lying on your back to sitting on the side of a flat bed without using bedrails?: A Little Help needed moving to and from a bed to a chair (including a wheelchair)?: A Little Help needed standing up from a chair using your arms (e.g., wheelchair or bedside chair)?: A Little Help needed to walk in hospital room?: Total Help needed climbing 3-5 steps with a railing? : Total 6 Click Score: 15    End of Session Equipment Utilized During Treatment: Gait belt Activity Tolerance: Patient limited by pain Patient left: in chair;with chair alarm set;with call bell/phone within reach (wound vac intact & plugged in) Nurse Communication:  (need for pain meds) PT Visit Diagnosis: Unsteadiness on feet (R26.81);Other abnormalities of gait and mobility (R26.89);Muscle weakness (generalized) (M62.81);Difficulty in walking, not elsewhere classified (R26.2)     Time: [id number] PT Time Calculation (min) (ACUTE ONLY): 15 min  Charges:  $Therapeutic Activity: 8-22 mins                     Lavone Nian, PT, DPT 11/24/20, 9:55 AM    Waunita Schooner 11/24/2020, 9:54 AM

## 2020-11-24 NOTE — Progress Notes (Signed)
Peripherally Inserted Central Catheter Placement  The IV Nurse has discussed with the patient and/or persons authorized to consent for the patient, the purpose of this procedure and the potential benefits and risks involved with this procedure.  The benefits include less needle sticks, lab draws from the catheter, and the patient may be discharged home with the catheter. Risks include, but not limited to, infection, bleeding, blood clot (thrombus formation), and puncture of an artery; nerve damage and irregular heartbeat and possibility to perform a PICC exchange if needed/ordered by physician.  Alternatives to this procedure were also discussed.  Bard Power PICC patient education guide, fact sheet on infection prevention and patient information card has been provided to patient /or left at bedside.    PICC Placement Documentation  PICC Single Lumen [id number] Right Basilic 35 cm 0 cm (Active)       Andrea [name] 11/24/2020, 4:08 PM

## 2020-11-25 ENCOUNTER — Ambulatory Visit: Payer: Medicare Other | Admitting: Cardiovascular Disease

## 2020-11-25 DIAGNOSIS — M86272 Subacute osteomyelitis, left ankle and foot: Secondary | ICD-10-CM | POA: Diagnosis not present

## 2020-11-25 DIAGNOSIS — M869 Osteomyelitis, unspecified: Secondary | ICD-10-CM | POA: Diagnosis not present

## 2020-11-25 LAB — AEROBIC/ANAEROBIC CULTURE W GRAM STAIN (SURGICAL/DEEP WOUND)
Culture: NO GROWTH
Gram Stain: NONE SEEN
Gram Stain: NONE SEEN

## 2020-11-25 LAB — BASIC METABOLIC PANEL
Anion gap: 6 (ref 5–15)
BUN: 16 mg/dL (ref 8–23)
CO2: 29 mmol/L (ref 22–32)
Calcium: 8.8 mg/dL — ABNORMAL LOW (ref 8.9–10.3)
Chloride: 100 mmol/L (ref 98–111)
Creatinine, Ser: 0.91 mg/dL (ref 0.44–1.00)
GFR, Estimated: >60 mL/min (ref >60)
Glucose, Bld: 103 mg/dL — ABNORMAL HIGH (ref 70–99)
Potassium: 4.1 mmol/L (ref 3.5–5.1)
Sodium: 135 mmol/L (ref 135–145)

## 2020-11-25 LAB — GLUCOSE, CAPILLARY
Glucose-Capillary: 114 mg/dL — ABNORMAL HIGH (ref 70–99)
Glucose-Capillary: 101 mg/dL — ABNORMAL HIGH (ref 70–99)
Glucose-Capillary: 168 mg/dL — ABNORMAL HIGH (ref 70–99)
Glucose-Capillary: 133 mg/dL — ABNORMAL HIGH (ref 70–99)

## 2020-11-25 LAB — CBC
HCT: 23.5 % — ABNORMAL LOW (ref 36.0–46.0)
Hemoglobin: 8.3 g/dL — ABNORMAL LOW (ref 12.0–15.0)
MCH: 33.1 pg (ref 26.0–34.0)
MCHC: 35.3 g/dL (ref 30.0–36.0)
MCV: 93.6 fL (ref 80.0–100.0)
Platelets: 139 10*3/uL — ABNORMAL LOW (ref 150–400)
RBC: 2.51 MIL/uL — ABNORMAL LOW (ref 3.87–5.11)
RDW: 12.3 % (ref 11.5–15.5)
WBC: 3 10*3/uL — ABNORMAL LOW (ref 4.0–10.5)
nRBC: 0 % (ref 0.0–0.2)

## 2020-11-25 MED ORDER — ONDANSETRON HCL 4 MG/2ML IJ SOLN
4.0000 mg | Freq: Three times a day (TID) | INTRAMUSCULAR | Status: DC | PRN
  Administered 2020-11-27 – 2020-11-30 (×2): 4 mg via INTRAVENOUS
  Filled 2020-11-25 (×2): qty 2

## 2020-11-25 MED ORDER — ONDANSETRON HCL 4 MG PO TABS: 4.0000 mg | ORAL_TABLET | Freq: Three times a day (TID) | ORAL | Status: DC | PRN

## 2020-11-25 MED ORDER — INSULIN ASPART 100 UNIT/ML IJ SOLN
0.0000 [IU] | Freq: Every day | INTRAMUSCULAR | Status: DC
Start: 2020-11-25 — End: 2020-11-26

## 2020-11-25 MED ORDER — ATORVASTATIN CALCIUM 20 MG PO TABS
20.0000 mg | ORAL_TABLET | Freq: Every day | ORAL | Status: DC
  Administered 2020-11-25 – 2020-12-01 (×8): 20 mg via ORAL
  Filled 2020-11-25 (×7): qty 1

## 2020-11-25 MED ORDER — INSULIN GLARGINE-YFGN 100 UNIT/ML ~~LOC~~ SOLN
35.0000 [IU] | Freq: Every day | SUBCUTANEOUS | Status: DC
  Administered 2020-11-25: 35 [IU] via SUBCUTANEOUS
  Filled 2020-11-25 (×2): qty 0.35

## 2020-11-25 MED ORDER — INSULIN GLARGINE-YFGN 100 UNIT/ML ~~LOC~~ SOLN
25.0000 [IU] | Freq: Every day | SUBCUTANEOUS | Status: DC
  Filled 2020-11-25: qty 0.25

## 2020-11-25 MED ORDER — INSULIN ASPART 100 UNIT/ML IJ SOLN
0.0000 [IU] | Freq: Three times a day (TID) | INTRAMUSCULAR | Status: DC
  Administered 2020-11-25: 2 [IU] via SUBCUTANEOUS
  Administered 2020-11-26 (×2): 1 [IU] via SUBCUTANEOUS
  Filled 2020-11-25 (×3): qty 1

## 2020-11-25 NOTE — Progress Notes (Signed)
ID Patient doing well No specific complaints Sitting in chair  O/e Awake and alert Patient Vitals for the past 24 hrs:  BP Temp Temp src Pulse Resp SpO2  11/25/20 1538 (!) 109/45 97.6 F (36.4 C) Oral 95 16 100 %  11/25/20 0813 (!) 144/56 98.6 F (37 C) -- 98 16 98 %  11/25/20 0515 (!) 130/49 97.8 F (36.6 C) -- 91 17 100 %  11/24/20 2018 (!) 125/43 98 F (36.7 C) -- 85 17 100 %  Chest clear to auscultation Heart sound S1s2 CNS nonfocal Left leg heel ulcer covered with wound VAC Right PICC line in place  Labs  CBC Latest Ref Rng & Units 11/25/2020 11/24/2020 11/23/2020  WBC 4.0 - 10.5 K/uL 3.0(L) 7.6 7.0  Hemoglobin 12.0 - 15.0 g/dL 8.3(L) 8.2(L) 7.6(L)  Hematocrit 36.0 - 46.0 % 23.5(L) 26.7(L) 25.1(L)  Platelets 150 - 400 K/uL 139(L) 198 165    CMP Latest Ref Rng & Units 11/25/2020 11/24/2020 11/23/2020  Glucose 70 - 99 mg/dL 103(H) 99 94  BUN 8 - 23 mg/dL 16 25(H) 30(H)  Creatinine 0.44 - 1.00 mg/dL 0.91 1.06(H) 1.15(H)  Sodium 135 - 145 mmol/L 135 135 137  Potassium 3.5 - 5.1 mmol/L 4.1 5.0 4.8  Chloride 98 - 111 mmol/L 100 113(H) 109  CO2 22 - 32 mmol/L 29 21(L) 22  Calcium 8.9 - 10.3 mg/dL 8.8(L) 9.1 8.5(L)  Total Protein 6.5 - 8.1 g/dL - - -  Total Bilirubin 0.3 - 1.2 mg/dL - - -  Alkaline Phos 38 - 126 U/L - - -  AST 15 - 41 U/L - - -  ALT 0 - 44 U/L - - -    Impression/recommendation Left heel wound status post debridement and partial calcanectomy.  Has a wound VAC Culture shows Pseudomonas Patient was on doxycycline and Keflex and so she came to the hospital.  And hence gram-positive organisms like staph aureus may not have been cultured. So we will give her cefepime for the Pseudomonas and daptomycin for gram-positive organisms like staph aureus. While on daptomycin we will reduce the dose of atorvastatin from 80mg  to 20 mg and observe closely for rhabdomyolysis with weekly CKs. Patient will need another 4 weeks of antibiotics. Discussed in great detail with  her [name] regarding the choice of antibiotics, the side effects of antibiotics the duration of antibiotics and close follow-up.

## 2020-11-25 NOTE — Progress Notes (Signed)
Physical Therapy Treatment Patient Details Name: Andrea [name] MRN: [id number] DOB: 26-Oct-1945 Today's Date: 11/25/2020   History of Present Illness 75 y.o. female with medical history significant for insulin-dependent diabetes mellitus, hypertension, hyperlipidemia, GERD, history of meningitis, CAD, COPD, history of TIA, grade 1 diastolic dysfunction, depression, anxiety, osteoarthritis, who presents to the emergency department from home on 11/17/2020 at the request of her PCP for chief concerns of osteomyelitis of the left heel.  L hip surgery on 08/19/2020. Underwent debridement of heel ulcer with partial calcanectomy and placement of antibiotic beads with application of wound vac left on 11/20/2020. NWB L LE.    PT Comments    Pt finishing with OT and PT assisted with transfer from [facility] to recliner.  Per her usual she was able to keep most of her weight off the L heel, but still clearly using ball of foot to take some weight.  Pt did well with seated exercises and though L hip is weaker and has more pain than R she ultimately did well and showed good effort.    Recommendations for follow up therapy are one component of a multi-disciplinary discharge planning process, led by the attending physician.  Recommendations may be updated based on patient status, additional functional criteria and insurance authorization.  Follow Up Recommendations  SNF;Supervision for mobility/OOB     Equipment Recommendations  3in1 (PT)    Recommendations for Other Services       Precautions / Restrictions Precautions Precautions: Fall Restrictions Weight Bearing Restrictions: Yes LLE Weight Bearing: Non weight bearing     Mobility  Bed Mobility Overal bed mobility: Modified Independent Bed Mobility: Supine to Sit     Supine to sit: Modified independent (Device/Increase time);HOB elevated     General bed mobility comments: Deferred. Pt up in recliner at start/end of session.     Transfers Overall transfer level: Needs assistance Equipment used: Rolling walker (2 wheeled) Transfers: Sit to/from [facility] Sit to Stand: Min guard;Min assist Stand pivot transfers: Mod assist       General transfer comment: Pt did not appear to need +2 assist but essentially requested as much.  She did manage to keep weight fully off the L heel, but clearly taking some weight through L toes/forefoot.  Pt able to cautiously manage/move walker and heel-toe/shift L foot to the side back to  Ambulation/Gait             General Gait Details: Unable to maintain consistent NWBing on the L, unsafe to try   Stairs             Wheelchair Mobility    Modified Rankin (Stroke Patients Only)       Balance Overall balance assessment: Needs assistance Sitting-balance support: No upper extremity supported;Feet supported Sitting balance-Leahy Scale: Good Sitting balance - Comments: G static sitting   Standing balance support: Bilateral upper extremity supported Standing balance-Leahy Scale: Poor Standing balance comment: not falling backward, etc but struggled with NWBing, tolerance and general lack of confidence in standing                            Cognition Arousal/Alertness: Awake/alert Behavior During Therapy: WFL for tasks assessed/performed Overall Cognitive Status: Within Functional Limits for tasks assessed                                 General Comments: Pleasant,  conversational t/o session.      Exercises General Exercises - Lower Extremity Long Arc Quad: Strengthening;10 reps Hip ABduction/ADduction: Strengthening;10 reps Hip Flexion/Marching: Strengthening;10 reps Other Exercises Other Exercises: OT facilitates functional mobility, toilet transfer (to [facility]), toileting, peri-care, with education on safe use of AE/DME for ADL management and falls prevention t/o session. Pt requires increased time/cueing 2/2  fear of faling and NWB status.    General Comments General comments (skin integrity, edema, etc.): Wound vac/LLE bandaging intact at start/end of session.      Pertinent Vitals/Pain Pain Assessment: 0-10 Pain Score: 6  Pain Location: LLE with mobility Pain Descriptors / Indicators: Discomfort;Aching;Throbbing Pain Intervention(s): Limited activity within patient's tolerance;Monitored during session;Repositioned    Home Living                      Prior Function            PT Goals (current goals can now be found in the care plan section) Acute Rehab PT Goals Patient Stated Goal: decreased pain Progress towards PT goals: Progressing toward goals    Frequency    7X/week      PT Plan Current plan remains appropriate    Co-evaluation              AM-PAC PT "6 Clicks" Mobility   Outcome Measure  Help needed turning from your back to your side while in a flat bed without using bedrails?: None Help needed moving from lying on your back to sitting on the side of a flat bed without using bedrails?: A Little Help needed moving to and from a bed to a chair (including a wheelchair)?: A Little Help needed standing up from a chair using your arms (e.g., wheelchair or bedside chair)?: A Little Help needed to walk in hospital room?: Total Help needed climbing 3-5 steps with a railing? : Total 6 Click Score: 15    End of Session Equipment Utilized During Treatment: Gait belt Activity Tolerance: Patient limited by pain Patient left: in chair;with chair alarm set;with call bell/phone within reach   PT Visit Diagnosis: Unsteadiness on feet (R26.81);Other abnormalities of gait and mobility (R26.89);Muscle weakness (generalized) (M62.81);Difficulty in walking, not elsewhere classified (R26.2)     Time: 4076-8088 PT Time Calculation (min) (ACUTE ONLY): 13 min  Charges:  $Therapeutic Exercise: 8-22 mins                     Kreg Shropshire, DPT 11/25/2020, 4:23  PM

## 2020-11-25 NOTE — Progress Notes (Signed)
Occupational Therapy Treatment Patient Details Name: Andrea [name] MRN: [id number] DOB: 09-10-1945 Today's Date: 11/25/2020   History of present illness M Chandra is a 75 y.o. female with medical history significant for insulin-dependent diabetes mellitus, hypertension, hyperlipidemia, GERD, history of meningitis, CAD, COPD, history of TIA, grade 1 diastolic dysfunction, depression, anxiety, osteoarthritis, who presents to the emergency department from home on 11/17/2020 at the request of her PCP for chief concerns of osteomyelitis of the left heel.  L hip surgery on 08/19/2020. Underwent debridement of heel ulcer with partial calcanectomy and placement of antibiotic beads with application of wound vac left on 11/20/2020. NWB L LE.   OT comments  Ms. Farrugia was seen for OT treatment on this date. Upon arrival to room pt awake/alert, seated up in room recliner. Pt endorsing 6/10 pain but agreeable to OT tx. Pt instructed in toilet transfer techniques for SPT to/from [facility] while adhering to NWB status through her LLE. Pt requires MOD A for SPT to [facility] with MAX-TOTAL Assist for standing peri-care. Pt has limited LB access 2/2 arms of commode and body habitus. Would benefit from bari or swing away arm commode to promote LB access and improved independence with peri care. She is able to stand using RW with BUE support to maintain NWB status during toileting this date. Pt requires min cueing for maintaining NWB status during functional mobility and STS attempts. Pt continues to benefit from skilled OT services to maximize return to PLOF and minimize risk of future falls, injury, caregiver burden, and readmission. Will continue to follow POC. Discharge recommendation remains appropriate.     Recommendations for follow up therapy are one component of a multi-disciplinary discharge planning process, led by the attending physician.  Recommendations may be updated based on patient status, additional functional criteria  and insurance authorization.    Follow Up Recommendations  SNF    Equipment Recommendations   (Defer)    Recommendations for Other Services      Precautions / Restrictions Precautions Precautions: Fall Restrictions Weight Bearing Restrictions: Yes LLE Weight Bearing: Non weight bearing       Mobility Bed Mobility               General bed mobility comments: Deferred. Pt up in recliner at start/end of session.    Transfers Overall transfer level: Needs assistance Equipment used: Rolling walker (2 wheeled) Transfers: Sit to/from [facility] Sit to Stand: Min guard;Min assist Stand pivot transfers: Mod assist            Balance Overall balance assessment: Needs assistance Sitting-balance support: No upper extremity supported;Feet supported   Sitting balance - Comments: G static sitting   Standing balance support: Bilateral upper extremity supported Standing balance-Leahy Scale: Poor Standing balance comment: BUE support on RW with min assist                           ADL either performed or assessed with clinical judgement   ADL                                         General ADL Comments: Pt requires MOD A for SPT to [facility] with MAX-TOTAL Assist for standing toileting. Pt has limited LB access 2/2 arms of commode and body habitus. Would benefit from bari or swing away arm commode to promote LB access  and improved independence with peri care. She is able to stand using RW with BUE support to maintain NWB status during toileting this date. Pt requires min cueing for maintaining NWB status during functional mobility and STS attempts.     Vision Baseline Vision/History: 1 Wears glasses Ability to See in Adequate Light: 1 Impaired Patient Visual Report: No change from baseline     Perception     Praxis      Cognition Arousal/Alertness: Awake/alert Behavior During Therapy: WFL for tasks  assessed/performed Overall Cognitive Status: Within Functional Limits for tasks assessed                                 General Comments: Pleasant, conversational t/o session.        Exercises Other Exercises Other Exercises: OT facilitates functional mobility, toilet transfer (to [facility]), toileting, peri-care, with education on safe use of AE/DME for ADL management and falls prevention t/o session. Pt requires increased time/cueing 2/2 fear of faling and NWB status.   Shoulder Instructions       General Comments Wound vac/LLE bandaging intact at start/end of session.    Pertinent Vitals/ Pain       Pain Assessment: 0-10 Pain Score: 6  Pain Location: LLE with mobility Pain Descriptors / Indicators: Discomfort;Aching;Throbbing Pain Intervention(s): Limited activity within patient's tolerance;Monitored during session;Repositioned  Home Living                                          Prior Functioning/Environment              Frequency  Min 1X/week        Progress Toward Goals  OT Goals(current goals can now be found in the care plan section)  Progress towards OT goals: Progressing toward goals  Acute Rehab OT Goals Patient Stated Goal: decreased pain OT Goal Formulation: With patient Time For Goal Achievement: 12/06/20 Potential to Achieve Goals: Good  Plan Discharge plan remains appropriate;Frequency remains appropriate    Co-evaluation                 AM-PAC OT "6 Clicks" Daily Activity     Outcome Measure   Help from another person eating meals?: None Help from another person taking care of personal grooming?: A Little Help from another person toileting, which includes using toliet, bedpan, or urinal?: A Lot Help from another person bathing (including washing, rinsing, drying)?: A Lot Help from another person to put on and taking off regular upper body clothing?: A Little Help from another person to put on and taking  off regular lower body clothing?: A Lot 6 Click Score: 16    End of Session Equipment Utilized During Treatment: Gait belt;Rolling walker  OT Visit Diagnosis: Unsteadiness on feet (R26.81);Muscle weakness (generalized) (M62.81);Pain Pain - Right/Left: Left Pain - part of body: Leg   Activity Tolerance Patient tolerated treatment well   Patient Left in chair;with call bell/phone within reach (With PT in room to begin session.)   Nurse Communication          Time: 8563-1497 OT Time Calculation (min): 35 min  Charges: OT General Charges $OT Visit: 1 Visit OT Treatments $Self Care/Home Management : 23-37 mins  Shara Blazing, M.S., OTR/L Ascom: [phone] 11/25/20, 3:38 PM

## 2020-11-25 NOTE — Progress Notes (Signed)
[name] PROGRESS NOTE    Andrea [name]  NAT:[id number] DOB: 1945/11/25 DOA: 11/17/2020 PCP: [name], FNP   Brief Narrative:  75 y.o. female with medical history significant for insulin-dependent diabetes mellitus, hypertension, hyperlipidemia, GERD, history of meningitis, CAD, COPD, history of TIA, grade 1 diastolic dysfunction, depression, anxiety, osteoarthritis, who presents to the emergency department from home at the request of her PCP for chief concerns of osteomyelitis of the left heel.   She states that the wound has been there for 3 months and she has taken antibiotics in it and has not resolved.  She states that this started after her orthopedic boot was taken off and she thinks that there might have been a scratch.  She states that the wound has progressively worsened prompting her to present to her PCP.  Her PCP ordered a foot x-ray on 11/13/2020 and resulted today.  She endorses inability to bear weight due to the pain.   Assessment & Plan:   Principal Problem:   Osteomyelitis ([place]) Active Problems:   Chronic obstructive pulmonary disease (HCC)   HLD (hyperlipidemia)   Coronary artery disease involving native coronary artery   PAD (peripheral artery disease) (HCC)   History of meningitis   Status post total hip replacement, left   Cellulitis of left lower extremity  Left heel osteomyelitis No sepsis. Recent ABIs wnl b/l. S/p debridement and partial calcectomy in the OR w/ podiatry on 9/15. Treated here w/ linezolid and cefepime (switched to linezolid from vanc 2/2 aki). Now on dapto/cefepime per ID. Pain well controlled. - f/u cultures, growing pseudomonas - cont dapto/cevepime, has picc for outpt abx - snf bed search underway - wound vac in place, will need changed 2x weekly - f/u Dr. Amalia Hailey podiatry 2 wks - nonweightbearing LLE - oxy/dilaudid for pain control. Bowel regimen ordered  Nausea Resolved today - discontinue standing zofran, make prn  Acute kidney  injury Poss 2/2 vanc, discontinued 9/15. Also holding nsaid and arb. Improving, wnl today - monitor  Anemia, normocytic Baseline hgb 8-10, 8.2 today, no bleeding. Iron studies suggestive of chronic disease. B12 wnl - ctm, if remains below 8 would transfuse given hx cad  Insulin-dependent diabetes mellitus Glucose wnl today, eating a bit less - lantus 35 (decrease from 45), continue SSI    Hypertension CAD, history MI BP well controlled Stopped losartan as above - PTA Toprol-XL 25 mg daily  Hyperlipidemia - decrease lipitor to 20 per ID recs, can increase back to 80 when off abx  Overactive bladder - PTA oxybutynin 20 mg nightly  GERD - PPI   History of meningitis and CSF leak No acute issues  DVT prophylaxis: SQ Lovenox Code Status: Full Family Communication: daughter updated @ bedside 9/19 Disposition Plan:Status is: inpt  The patient will require care spanning > 2 midnights and should be moved to inpatient because: Inpatient level of care appropriate due to severity of illness  Dispo: The patient is from: Home              Anticipated d/c is to: SNF              Patient currently is not medically stable to d/c.   Difficult to place patient No    Level of care: Med-Surg  Consultants:  Podiatry-Dr. Amalia Hailey  Procedures:  Debridement in OR 9/15  Antimicrobials:  daptomycin Cefepime   Subjective: Seen and examined.  Pain controlled. Tolerating diet, improved from yesterday. No nausea/vomiting.  Objective: Vitals:   11/24/20 1614  11/24/20 2018 11/25/20 0515 11/25/20 0813  BP: (!) 131/54 (!) 125/43 (!) 130/49 (!) 144/56  Pulse: 95 85 91 98  Resp: 18 17 17 16   Temp: 98.4 F (36.9 C) 98 F (36.7 C) 97.8 F (36.6 C) 98.6 F (37 C)  TempSrc: Oral     SpO2: 100% 100% 100% 98%  Weight:      Height:        Intake/Output Summary (Last 24 hours) at 11/25/2020 1232 Last data filed at 11/25/2020 1200 Gross per 24 hour  Intake 235.75 ml  Output 840 ml  Net  -604.25 ml   Filed Weights   11/17/20 1744 11/20/20 1538  Weight: 62.6 kg 62.6 kg    Examination:  General exam: Appears calm and comfortable  Respiratory system: Lungs clear.  Normal work of breathing.  Room air Cardiovascular system: S1-S2, RRR, no murmurs, no pedal edema Gastrointestinal system: Abdomen is nondistended, soft and nontender. No organomegaly or masses felt. Normal bowel sounds heard. Central nervous system: Alert and oriented. No focal neurological deficits. Extremities: Left heel full-thickness ulcer with surrounding edema and serosanguineous drainage Skin: lft heel bandaged Psychiatry: Judgement and insight appear normal. Mood & affect appropriate.     Data Reviewed: I have personally reviewed following labs and imaging studies  CBC: Recent Labs  Lab 11/21/20 0420 11/22/20 0433 11/23/20 0513 11/24/20 0403 11/25/20 0504  WBC 8.7 10.0 7.0 7.6 3.0*  HGB 8.7* 8.2* 7.6* 8.2* 8.3*  HCT 28.1* 26.8* 25.1* 26.7* 23.5*  MCV 88.9 91.2 90.9 90.2 93.6  PLT 175 174 165 198 384*   Basic Metabolic Panel: Recent Labs  Lab 11/21/20 0420 11/22/20 0433 11/23/20 0513 11/24/20 0403 11/25/20 0504  NA 135 133* 137 135 135  K 5.0 5.0 4.8 5.0 4.1  CL 113* 109 109 113* 100  CO2 19* 21* 22 21* 29  GLUCOSE 226* 209* 94 99 103*  BUN 38* 34* 30* 25* 16  CREATININE 1.14* 1.19* 1.15* 1.06* 0.91  CALCIUM 8.4* 8.8* 8.5* 9.1 8.8*   GFR: Estimated Creatinine Clearance: 44.1 mL/min (by C-G formula based on SCr of 0.91 mg/dL). Liver Function Tests: No results for input(s): AST, ALT, ALKPHOS, BILITOT, PROT, ALBUMIN in the last 168 hours. No results for input(s): LIPASE, AMYLASE in the last 168 hours. No results for input(s): AMMONIA in the last 168 hours. Coagulation Profile: No results for input(s): INR, PROTIME in the last 168 hours. Cardiac Enzymes: Recent Labs  Lab 11/24/20 0403  CKTOTAL 20*   BNP (last 3 results) No results for input(s): PROBNP in the last 8760  hours. HbA1C: No results for input(s): HGBA1C in the last 72 hours.  CBG: Recent Labs  Lab 11/24/20 1205 11/24/20 1634 11/24/20 2147 11/24/20 2223 11/25/20 0909  GLUCAP 155* 76 61* 93 114*   Lipid Profile: No results for input(s): CHOL, HDL, LDLCALC, TRIG, CHOLHDL, LDLDIRECT in the last 72 hours. Thyroid Function Tests: No results for input(s): TSH, T4TOTAL, FREET4, T3FREE, THYROIDAB in the last 72 hours. Anemia Panel: Recent Labs    11/23/20 1213  VITAMINB12 1,375*  FERRITIN 200  TIBC 193*  IRON 67   Sepsis Labs: No results for input(s): PROCALCITON, LATICACIDVEN in the last 168 hours.  Recent Results (from the past 240 hour(s))  Resp Panel by RT-PCR (Flu A&B, Covid) Nasopharyngeal Swab     Status: None   Collection Time: 11/17/20  7:45 AM   Specimen: Nasopharyngeal Swab; Nasopharyngeal(NP) swabs in vial transport medium  Result Value Ref Range Status  SARS Coronavirus 2 by RT PCR NEGATIVE NEGATIVE Final    Comment: (NOTE) SARS-CoV-2 target nucleic acids are NOT DETECTED.  The SARS-CoV-2 RNA is generally detectable in upper respiratory specimens during the acute phase of infection. The lowest concentration of SARS-CoV-2 viral copies this assay can detect is 138 copies/mL. A negative result does not preclude SARS-Cov-2 infection and should not be used as the sole basis for treatment or other patient management decisions. A negative result may occur with  improper specimen collection/handling, submission of specimen other than nasopharyngeal swab, presence of viral mutation(s) within the areas targeted by this assay, and inadequate number of viral copies(<138 copies/mL). A negative result must be combined with clinical observations, patient history, and epidemiological information. The expected result is Negative.  Fact Sheet for Patients:  EntrepreneurPulse.com.au  Fact Sheet for Healthcare Providers:   IncredibleEmployment.be  This test is no t yet approved or cleared by the [place] FDA and  has been authorized for detection and/or diagnosis of SARS-CoV-2 by FDA under an Emergency Use Authorization (EUA). This EUA will remain  in effect (meaning this test can be used) for the duration of the COVID-19 declaration under Section 564(b)(1) of the Act, 21 U.S.C.section 360bbb-3(b)(1), unless the authorization is terminated  or revoked sooner.       Influenza A by PCR NEGATIVE NEGATIVE Final   Influenza B by PCR NEGATIVE NEGATIVE Final    Comment: (NOTE) The Xpert Xpress SARS-CoV-2/FLU/RSV plus assay is intended as an aid in the diagnosis of influenza from Nasopharyngeal swab specimens and should not be used as a sole basis for treatment. Nasal washings and aspirates are unacceptable for Xpert Xpress SARS-CoV-2/FLU/RSV testing.  Fact Sheet for Patients: EntrepreneurPulse.com.au  Fact Sheet for Healthcare Providers: IncredibleEmployment.be  This test is not yet approved or cleared by the [place] FDA and has been authorized for detection and/or diagnosis of SARS-CoV-2 by FDA under an Emergency Use Authorization (EUA). This EUA will remain in effect (meaning this test can be used) for the duration of the COVID-19 declaration under Section 564(b)(1) of the Act, 21 U.S.C. section 360bbb-3(b)(1), unless the authorization is terminated or revoked.  Performed at [facility], [address] [place], [place] [address]   CULTURE, BLOOD (ROUTINE X 2) w Reflex to ID Panel     Status: None   Collection Time: 11/17/20 10:34 PM   Specimen: BLOOD  Result Value Ref Range Status   Specimen Description BLOOD LEFT FOREARM  Final   Special Requests   Final    BOTTLES DRAWN AEROBIC AND ANAEROBIC Blood Culture adequate volume   Culture   Final    NO GROWTH 5 DAYS Performed at [facility], [address] [place], [place] [address]    Report Status 11/22/2020 FINAL  Final  CULTURE, BLOOD (ROUTINE X 2) w Reflex to ID Panel     Status: None   Collection Time: 11/17/20 10:35 PM   Specimen: BLOOD  Result Value Ref Range Status   Specimen Description BLOOD RIGHT FOREARM  Final   Special Requests   Final    BOTTLES DRAWN AEROBIC ONLY Blood Culture adequate volume   Culture   Final    NO GROWTH 5 DAYS Performed at [facility], [address], [place], [place] [address]    Report Status 11/22/2020 FINAL  Final  Surgical pcr screen     Status: None   Collection Time: 11/19/20 12:41 PM   Specimen: Nasal Mucosa; Nasal Swab  Result  Value Ref Range Status   MRSA, PCR NEGATIVE NEGATIVE Final   Staphylococcus aureus NEGATIVE NEGATIVE Final    Comment: (NOTE) The Xpert SA Assay (FDA approved for NASAL specimens in patients 85 years of age and older), is one component of a comprehensive surveillance program. It is not intended to diagnose infection nor to guide or monitor treatment. Performed at [facility], [place], [place] [address]   Aerobic/Anaerobic Culture w Gram Stain (surgical/deep wound)     Status: None   Collection Time: 11/20/20  5:08 PM   Specimen: PATH Bone biopsy; Tissue  Result Value Ref Range Status   Specimen Description   Final    HEEL Performed at [facility], [address], [place], [place] [address]    Special Requests   Final    LEFT HEEL BONE CULTURE Performed at [facility], [place], [place] [address]    Gram Stain NO WBC SEEN RARE GRAM NEGATIVE RODS   Final   Culture   Final    RARE PSEUDOMONAS AERUGINOSA NO ANAEROBES ISOLATED Performed at [facility], [place] [address], [place], [place] [address]    Report Status 11/25/2020 FINAL  Final   Organism ID, Bacteria PSEUDOMONAS AERUGINOSA  Final      Susceptibility   Pseudomonas aeruginosa - MIC*    CEFTAZIDIME 4 SENSITIVE Sensitive      CIPROFLOXACIN <=0.25 SENSITIVE Sensitive     GENTAMICIN <=1 SENSITIVE Sensitive     IMIPENEM 2 SENSITIVE Sensitive     PIP/TAZO 16 SENSITIVE Sensitive     CEFEPIME 4 SENSITIVE Sensitive     * RARE PSEUDOMONAS AERUGINOSA  Aerobic/Anaerobic Culture w Gram Stain (surgical/deep wound)     Status: None   Collection Time: 11/20/20  5:08 PM   Specimen: PATH Bone biopsy; Tissue  Result Value Ref Range Status   Specimen Description   Final    WOUND Performed at [facility], [address], [place], [place] [address]    Special Requests   Final    LEFT HEEL WD CULTURE Performed at [facility], [place], [place] [address]    Gram Stain NO WBC SEEN NO ORGANISMS SEEN   Final   Culture   Final    No growth aerobically or anaerobically. Performed at [facility], [place] [address] [place], [place] [address]    Report Status 11/25/2020 FINAL  Final         Radiology Studies: DG Chest Port 1 View  Result Date: 11/24/2020 CLINICAL DATA:  PICC line placement EXAM: PORTABLE CHEST 1 VIEW COMPARISON:  Portable exam 1628 hours compared to 12/26/2019 FINDINGS: RIGHT arm PICC line tip projects over SVC. Upper normal size of cardiac silhouette. Mediastinal contours and pulmonary vascularity normal. Atherosclerotic calcification aorta. Lungs clear. No infiltrate, pleural effusion, or pneumothorax. Bones demineralized with scattered degenerative changes thoracic spine. IMPRESSION: Tip of RIGHT arm PICC line projects over SVC. Aortic Atherosclerosis (ICD10-I70.0). Electronically Signed   By: Lavonia Dana M.D.   On: 11/24/2020 16:59   [place] EKG SITE RITE  Result Date: 11/24/2020 If Site Rite image not attached, placement could not be confirmed due to current cardiac rhythm.       Scheduled Meds:  acetaminophen  1,000 mg Oral TID   atorvastatin  20 mg Oral Daily   Chlorhexidine Gluconate Cloth  6 each Topical Daily   enoxaparin (LOVENOX) injection  40 mg  Subcutaneous Q24H  insulin aspart  0-5 Units Subcutaneous QHS   insulin aspart  0-9 Units Subcutaneous TID WC   insulin glargine-yfgn  35 Units Subcutaneous QHS   metoprolol succinate  25 mg Oral Daily   ondansetron  4 mg Oral TID with meals   Or   ondansetron (ZOFRAN) IV  4 mg Intravenous TID with meals   oxybutynin  10 mg Oral QHS   pantoprazole  80 mg Oral QAC breakfast   polyethylene glycol  17 g Oral Daily   senna  1 tablet Oral Daily   sodium chloride flush  10-40 mL Intracatheter Q12H   vitamin B-12  1,000 mcg Oral Daily   Continuous Infusions:  sodium chloride 1,000 mL (11/21/20 2136)   ceFEPime (MAXIPIME) IV 2 g (11/25/20 0915)   DAPTOmycin (CUBICIN)  IV Stopped (11/24/20 1732)     LOS: 7 days    Time spent: 20 minutes    Desma Maxim, MD [facility]  If 7PM-7AM, please contact night-coverage 11/25/2020, 12:32 PM

## 2020-11-25 NOTE — Treatment Plan (Addendum)
Diagnosis: Left heel ulcer with osteomyelitis. Baseline Creatinine 0.9. ( Was 1.47 during this hospitalization.]  Culture Result: Pseudomonas in the wound culture  Allergies  Allergen Reactions   Metformin And Related Diarrhea   Sulfa Antibiotics Rash    OPAT Orders Discharge antibiotics: Daptomycin 400 mg IV every 24 hours Cefepime 2 g IV every 12 hours  Duration: Total of 6 weeks  End Date: 12/28/2020.   [facility] Care Per Protocol:  Labs weekly on every Monday while on IV antibiotics: __X CBC with differential  _X_ CMP _X CK (creatinine kinase) _X_ ESR   _X_ Please pull PIC at completion of IV antibiotics   Fax weekly labs promptly to Dr. Delaine Lame at [phone]  Clinic Follow Up Appt:12/16/20 at 11.45 am   Call [phone] with any critical values like elevated CK or elevated eosinophilic count

## 2020-11-25 NOTE — Progress Notes (Signed)
Pharmacy Antibiotic Note  Andrea [name] is a 75 y.o. female admitted on 11/17/2020 with Osteomyelitis of the left heel. S/P I&D and bone biopsy 9/15. Patient was seeing podiatry outpatient, and failed treatment on doxycycline and Keflex. Patient was started on Vancomycin and Cefepime on admission. On 9/14 vancomycin was changed to linezolid per ID.   Pharmacy now consulted for daptomycin dosing.Patient is expected to receive 4-6 weeks of antibiotics.   9/19 CK: 20  Plan: - daptomycin 6mg /kg every 24 hours.  Will monitor CrCl and adjust dose as needed. CK checks weekly per protocol.   Patient was receiving atorvastatin 80mg  daily. Concurrent use of DAPTOMYCIN and STATINS may result in increased risk of myopathy or rhabdomyolysis. Atorvastatin has been discontinued at this time.   -Continue cefepime 2g IV every 12 hours.      Height: 5' (152.4 cm) Weight: 62.6 kg (138 lb 0.1 oz) IBW/kg (Calculated) : 45.5  Temp (24hrs), Avg:98.2 F (36.8 C), Min:97.8 F (36.6 C), Max:98.6 F (37 C)  Recent Labs  Lab 11/21/20 0420 11/22/20 0433 11/23/20 0513 11/24/20 0403 11/25/20 0504  WBC 8.7 10.0 7.0 7.6 3.0*  CREATININE 1.14* 1.19* 1.15* 1.06* 0.91     Estimated Creatinine Clearance: 44.1 mL/min (by C-G formula based on SCr of 0.91 mg/dL).    Allergies  Allergen Reactions   Metformin And Related Diarrhea   Sulfa Antibiotics Rash    Antimicrobials this admission: 9/13 Cefepime>> 9/13 Vancomycin>>9/15 9/16 Linezolid>> 9/19 9/19 Daptomycin >>   Microbiology results: 9/12 BCx: NG Final  9/15 Heel biopsy: Pseudomonas aeruginosa  9/14 MRSA PCR: negative   Thank you for allowing pharmacy to be a part of this patient's care.  Noralee Space, PharmD Clinical Pharmacist 11/25/2020 10:39 AM

## 2020-11-25 NOTE — Progress Notes (Signed)
Hypoglycemic Event  CBG: 61  Treatment: 8 oz juice/soda  Symptoms: None  Follow-up CBG: Time:2223 CBG Result:93  Possible Reasons for Event: Inadequate meal intake    Andrea [name] [name]

## 2020-11-25 NOTE — Progress Notes (Signed)
PT Cancellation Note  Patient Details Name: Andrea [name] MRN: [id number] DOB: Dec 14, 1945   Cancelled Treatment:    Reason Eval/Treat Not Completed: Other (comment) Attempted to see pt this AM, on the bed pan and needing more time.  Eager to get to the recliner if possible, will plan to see later today as pt and PT schedule allow.  Kreg Shropshire, DPT 11/25/2020, 10:14 AM

## 2020-11-25 NOTE — Progress Notes (Signed)
PHARMACY CONSULT NOTE FOR:  OUTPATIENT  PARENTERAL ANTIBIOTIC THERAPY (OPAT)  Indication: Left heel ulcer with osteomyelitis Regimen: Daptomycin 400 mg IV q24h and Cefepime 2 g IV q12h End date: 12/28/2020  IV antibiotic discharge orders are pended. To discharging provider:  please sign these orders via discharge navigator,  Select New Orders & click on the button choice - Manage This Unsigned Work.     Thank you for allowing pharmacy to be a part of this patient's care.  Andrea [name] 11/25/2020, 8:41 PM

## 2020-11-25 NOTE — Progress Notes (Signed)
Inpatient Diabetes Program Recommendations  AACE/ADA: New Consensus Statement on Inpatient Glycemic Control (2015)  Target Ranges:  Prepandial:   less than 140 mg/dL      Peak postprandial:   less than 180 mg/dL (1-2 hours)      Critically ill patients:  140 - 180 mg/dL   Lab Results  Component Value Date   GLUCAP 114 (H) 11/25/2020   HGBA1C 7.5 (H) 11/17/2020    Review of Glycemic Control Results for Andrea [name], Andrea [name] (MRN [id number]) as of 11/25/2020 10:25  Ref. Range 11/24/2020 07:51 11/24/2020 12:05 11/24/2020 16:34 11/24/2020 21:47 11/24/2020 22:23 11/25/2020 09:09  Glucose-Capillary Latest Ref Range: 70 - 99 mg/dL 83 155 (H) 76 61 (L) 93 114 (H)    Inpatient Diabetes Program Recommendations:   Patient did not receive basal insulin Semglee 45 units due to CBG 61 and not eating.  Fasting CBG without Semglee was 114 Please consider: -Decrease Semglee to 25 units -Decrease Novolog correction to 0-9 units tid + hs 0-5 units Secure chat sent to Dr. Si Raider.  Thank you, Nani Gasser. Vineta Carone, RN, MSN, CDE  Diabetes Coordinator Inpatient Glycemic Control Team Team Pager [phone] (8am-5pm) 11/25/2020 10:29 AM

## 2020-11-26 ENCOUNTER — Ambulatory Visit: Payer: Medicare Other | Admitting: Internal Medicine

## 2020-11-26 DIAGNOSIS — M86179 Other acute osteomyelitis, unspecified ankle and foot: Secondary | ICD-10-CM

## 2020-11-26 DIAGNOSIS — E7849 Other hyperlipidemia: Secondary | ICD-10-CM

## 2020-11-26 LAB — CBC
HCT: 24.5 % — ABNORMAL LOW (ref 36.0–46.0)
Hemoglobin: 7.9 g/dL — ABNORMAL LOW (ref 12.0–15.0)
MCH: 29.3 pg (ref 26.0–34.0)
MCHC: 32.2 g/dL (ref 30.0–36.0)
MCV: 90.7 fL (ref 80.0–100.0)
Platelets: 196 10*3/uL (ref 150–400)
RBC: 2.7 MIL/uL — ABNORMAL LOW (ref 3.87–5.11)
RDW: 16.4 % — ABNORMAL HIGH (ref 11.5–15.5)
WBC: 5.4 10*3/uL (ref 4.0–10.5)
nRBC: 0 % (ref 0.0–0.2)

## 2020-11-26 LAB — BASIC METABOLIC PANEL
Anion gap: 2 — ABNORMAL LOW (ref 5–15)
BUN: 21 mg/dL (ref 8–23)
CO2: 25 mmol/L (ref 22–32)
Calcium: 9.1 mg/dL (ref 8.9–10.3)
Chloride: 111 mmol/L (ref 98–111)
Creatinine, Ser: 0.92 mg/dL (ref 0.44–1.00)
GFR, Estimated: >60 mL/min (ref >60)
Glucose, Bld: 57 mg/dL — ABNORMAL LOW (ref 70–99)
Potassium: 4.3 mmol/L (ref 3.5–5.1)
Sodium: 138 mmol/L (ref 135–145)

## 2020-11-26 LAB — GLUCOSE, CAPILLARY
Glucose-Capillary: 62 mg/dL — ABNORMAL LOW (ref 70–99)
Glucose-Capillary: 100 mg/dL — ABNORMAL HIGH (ref 70–99)
Glucose-Capillary: 143 mg/dL — ABNORMAL HIGH (ref 70–99)
Glucose-Capillary: 140 mg/dL — ABNORMAL HIGH (ref 70–99)

## 2020-11-26 LAB — PREPARE RBC (CROSSMATCH)

## 2020-11-26 MED ORDER — INSULIN GLARGINE-YFGN 100 UNIT/ML ~~LOC~~ SOLN
20.0000 [IU] | Freq: Every day | SUBCUTANEOUS | Status: DC
  Administered 2020-11-27 – 2020-12-01 (×5): 20 [IU] via SUBCUTANEOUS
  Filled 2020-11-26 (×6): qty 0.2

## 2020-11-26 MED ORDER — INSULIN GLARGINE-YFGN 100 UNIT/ML ~~LOC~~ SOLN
30.0000 [IU] | Freq: Every day | SUBCUTANEOUS | Status: DC
  Filled 2020-11-26: qty 0.3

## 2020-11-26 MED ORDER — SODIUM CHLORIDE 0.9% IV SOLUTION: Freq: Once | INTRAVENOUS | Status: AC

## 2020-11-26 NOTE — Progress Notes (Signed)
PROGRESS NOTE  Andrea [name]    DOB: 13-Jul-1945, 75 y.o.  KXF:[id number]  PCP: [name], FNP   Code Status: Full Code   DOA: 11/17/2020   LOS: 8  Brief Narrative of Current Hospitalization  Andrea [name] is a 75 y.o. female with a PMH significant for insulin-dependent diabetes mellitus, hypertension, hyperlipidemia, GERD, history of meningitis, CAD, COPD, history of TIA, grade 1 diastolic dysfunction, depression, anxiety, osteoarthritis. They presented from home to the ED on 11/17/2020 with wound of left heel x3 months. In the ED, it was found that they had osteomyelitis. They were treated with IV antibiotics.  Patient was admitted to medicine service for further workup and management of osteomyelitis as outlined in detail below.  11/26/20 -stable and awaiting discharge to SNF  Assessment & Plan  Principal Problem:   Osteomyelitis ([place]) Active Problems:   Chronic obstructive pulmonary disease ([place])   HLD (hyperlipidemia)   Coronary artery disease involving native coronary artery   PAD (peripheral artery disease) (H[facility])   History of meningitis   Status post total hip replacement, left   Cellulitis of left lower extremity   Left heel osteomyelitis- s/p debridement and partial calcectomy 9/15.  Wound VAC in place.  Patient tolerated well. -Continue IV daptomycin and cefepime for 4 weeks total, per ID -Patient stable for discharge to SNF once patient has been found -Change wound VAC 2 times per week -Follow-up with Dr. Amalia Hailey (podiatry) in 2 weeks -Oxycodone as needed for pain -Bowel regimen  AKI-secondary to medications.  Resolved   Anemia-hemoglobin slightly reduced today to 7.9.  With history of CAD, transfusion threshold is less than 8.  Will give 1 unit packed red blood cells today with follow-up CBC -Transfuse 1 unit packed red blood cells -Follow-up CBC and in a.m.  Type 2 diabetes-fasting glucose this morning was 57.  Patient was asymptomatic and given juice.   Blood sugars quickly responded up to 100.  Hemoglobin A1c was 7.5 on admission which is well below goal of 8 for her age. -Discontinued nighttime short acting insulin -Decreased long-acting insulin to 20 units, changed to morning dose starting tomorrow -Continue CBG monitoring with meals for short acting sliding scale  HTN-elevated systolics but low diastolics.  Patient is asymptomatic. -Continue home metoprolol succinate 25 mg daily  HLD- - continue Lipitor at 20 mg while on daptomycin.  Weekly CK.  Overactive bladder-Home meds include oxybutynin -Continue home med  DVT prophylaxis: enoxaparin (LOVENOX) injection 40 mg Start: 11/21/20 1000 Place TED hose Start: 11/17/20 2033   Diet:  Diet Orders (From admission, onward)     Start     Ordered   11/20/20 1917  Diet Carb Modified Fluid consistency: Thin; Room service appropriate? Yes  Diet effective now       Question Answer Comment  Calorie Level Medium 1600-2000   Fluid consistency: Thin   Room service appropriate? Yes      11/20/20 1916            Subjective 11/26/20    Pt reports feeling well.  She is anxious to go to rehab but understands that it is difficult to find a place that will accept her wound VAC care.  She has no complaints or concerns today.  Disposition Plan & Communication  Status is: Inpatient  Remains inpatient appropriate because:Unsafe d/c plan  Dispo: The patient is from: Home              Anticipated d/c is to: SNF  Patient currently is medically stable to d/c.   Difficult to place patient Yes        Family Communication: N/A  Consults, Procedures, Significant Events  Consultants:  Infectious disease Podiatry  Procedures/significant events:  .  Debridement of left heel 9/15  Antimicrobials:  Anti-infectives (From admission, onward)    Start     Dose/Rate Route Frequency Ordered Stop   11/24/20 1600  DAPTOmycin (CUBICIN) 400 mg in sodium chloride 0.9 % IVPB        6  mg/kg  62.6 kg 116 mL/hr over 30 Minutes Intravenous Daily 11/24/20 1411     11/21/20 1400  ceFEPIme (MAXIPIME) 2 g in sodium chloride 0.9 % 100 mL IVPB        2 g 200 mL/hr over 30 Minutes Intravenous Every 12 hours 11/21/20 0730     11/21/20 0141  ceFEPIme (MAXIPIME) 2 g in sodium chloride 0.9 % 100 mL IVPB  Status:  Discontinued        2 g 200 mL/hr over 30 Minutes Intravenous Every 24 hours 11/20/20 0923 11/21/20 0730   11/20/20 1800  linezolid (ZYVOX) IVPB 600 mg  Status:  Discontinued        600 mg 300 mL/hr over 60 Minutes Intravenous Every 12 hours 11/20/20 0915 11/24/20 1317   11/20/20 1702  vancomycin (VANCOCIN) powder  Status:  Discontinued          As needed 11/20/20 1702 11/20/20 1743   11/20/20 1700  tobramycin (NEBCIN) powder  Status:  Discontinued          As needed 11/20/20 1701 11/20/20 1743   11/19/20 2200  vancomycin (VANCOCIN) IVPB 1000 mg/200 mL premix  Status:  Discontinued        1,000 mg 200 mL/hr over 60 Minutes Intravenous Every 24 hours 11/19/20 0934 11/20/20 0915   11/19/20 0100  vancomycin (VANCOREADY) IVPB 750 mg/150 mL  Status:  Discontinued        750 mg 150 mL/hr over 60 Minutes Intravenous Every 24 hours 11/18/20 0028 11/19/20 0934   11/18/20 0200  ceFEPIme (MAXIPIME) 2 g in sodium chloride 0.9 % 100 mL IVPB  Status:  Discontinued        2 g 200 mL/hr over 30 Minutes Intravenous Every 12 hours 11/17/20 2347 11/20/20 0923   11/18/20 0100  vancomycin (VANCOREADY) IVPB 1500 mg/300 mL        1,500 mg 150 mL/hr over 120 Minutes Intravenous  Once 11/17/20 2347 11/18/20 0238        Objective   Vitals:   11/25/20 0515 11/25/20 0813 11/25/20 1538 11/25/20 2159  BP: (!) 130/49 (!) 144/56 (!) 109/45 (!) 119/49  Pulse: 91 98 95 84  Resp: 17 16 16 16   Temp: 97.8 F (36.6 C) 98.6 F (37 C) 97.6 F (36.4 C) 98 F (36.7 C)  TempSrc:   Oral Oral  SpO2: 100% 98% 100% 100%  Weight:      Height:        Intake/Output Summary (Last 24 hours) at 11/26/2020  0725 Last data filed at 11/26/2020 0600 Gross per 24 hour  Intake --  Output 1540 ml  Net -1540 ml   Filed Weights   11/17/20 1744 11/20/20 1538  Weight: 62.6 kg 62.6 kg    Patient BMI: Body mass index is 26.95 kg/m.   Physical Exam: General: awake, alert, NAD, sitting in chair HEENT: atraumatic, clear conjunctiva, anicteric sclera, moist mucus membranes, hearing grossly normal Respiratory: normal respiratory effort. Cardiovascular:  quick capillary refill  Gastrointestinal: soft, NT, ND, no HSM felt Nervous: A&O x4. no gross focal neurologic deficits, normal speech Extremities: moves all equally, no edema, normal tone Skin: dry, intact, normal temperature, normal color, No rashes, lesions or ulcers.  Wound VAC in place Psychiatry: normal mood, congruent affect, judgement and insight appear normal  Labs   I have personally reviewed following labs and imaging studies No results displayed because visit has over 200 results.       Recent Results (from the past 240 hour(s))  Resp Panel by RT-PCR (Flu A&B, Covid) Nasopharyngeal Swab     Status: None   Collection Time: 11/17/20  7:45 AM   Specimen: Nasopharyngeal Swab; Nasopharyngeal(NP) swabs in vial transport medium  Result Value Ref Range Status   SARS Coronavirus 2 by RT PCR NEGATIVE NEGATIVE Final    Comment: (NOTE) SARS-CoV-2 target nucleic acids are NOT DETECTED.  The SARS-CoV-2 RNA is generally detectable in upper respiratory specimens during the acute phase of infection. The lowest concentration of SARS-CoV-2 viral copies this assay can detect is 138 copies/mL. A negative result does not preclude SARS-Cov-2 infection and should not be used as the sole basis for treatment or other patient management decisions. A negative result may occur with  improper specimen collection/handling, submission of specimen other than nasopharyngeal swab, presence of viral mutation(s) within the areas targeted by this assay, and inadequate  number of viral copies(<138 copies/mL). A negative result must be combined with clinical observations, patient history, and epidemiological information. The expected result is Negative.  Fact Sheet for Patients:  EntrepreneurPulse.com.au  Fact Sheet for Healthcare Providers:  IncredibleEmployment.be  This test is no t yet approved or cleared by the [place] FDA and  has been authorized for detection and/or diagnosis of SARS-CoV-2 by FDA under an Emergency Use Authorization (EUA). This EUA will remain  in effect (meaning this test can be used) for the duration of the COVID-19 declaration under Section 564(b)(1) of the Act, 21 U.S.C.section 360bbb-3(b)(1), unless the authorization is terminated  or revoked sooner.       Influenza A by PCR NEGATIVE NEGATIVE Final   Influenza B by PCR NEGATIVE NEGATIVE Final    Comment: (NOTE) The Xpert Xpress SARS-CoV-2/FLU/RSV plus assay is intended as an aid in the diagnosis of influenza from Nasopharyngeal swab specimens and should not be used as a sole basis for treatment. Nasal washings and aspirates are unacceptable for Xpert Xpress SARS-CoV-2/FLU/RSV testing.  Fact Sheet for Patients: EntrepreneurPulse.com.au  Fact Sheet for Healthcare Providers: IncredibleEmployment.be  This test is not yet approved or cleared by the [place] FDA and has been authorized for detection and/or diagnosis of SARS-CoV-2 by FDA under an Emergency Use Authorization (EUA). This EUA will remain in effect (meaning this test can be used) for the duration of the COVID-19 declaration under Section 564(b)(1) of the Act, 21 U.S.C. section 360bbb-3(b)(1), unless the authorization is terminated or revoked.  Performed at [facility], [address] [place], [place] [address]   CULTURE, BLOOD (ROUTINE X 2) w Reflex to ID Panel     Status: None   Collection Time: 11/17/20 10:34  PM   Specimen: BLOOD  Result Value Ref Range Status   Specimen Description BLOOD LEFT FOREARM  Final   Special Requests   Final    BOTTLES DRAWN AEROBIC AND ANAEROBIC Blood Culture adequate volume   Culture   Final    NO GROWTH 5 DAYS Performed at [facility], [address] [place], [place] [address]    Report Status 11/22/2020 FINAL  Final  CULTURE, BLOOD (ROUTINE X 2) w Reflex to ID Panel     Status: None   Collection Time: 11/17/20 10:35 PM   Specimen: BLOOD  Result Value Ref Range Status   Specimen Description BLOOD RIGHT FOREARM  Final   Special Requests   Final    BOTTLES DRAWN AEROBIC ONLY Blood Culture adequate volume   Culture   Final    NO GROWTH 5 DAYS Performed at [facility], [address] [place], [place] [address]    Report Status 11/22/2020 FINAL  Final  Surgical pcr screen     Status: None   Collection Time: 11/19/20 12:41 PM   Specimen: Nasal Mucosa; Nasal Swab  Result Value Ref Range Status   MRSA, PCR NEGATIVE NEGATIVE Final   Staphylococcus aureus NEGATIVE NEGATIVE Final    Comment: (NOTE) The Xpert SA Assay (FDA approved for NASAL specimens in patients 53 years of age and older), is one component of a comprehensive surveillance program. It is not intended to diagnose infection nor to guide or monitor treatment. Performed at [facility], [place], [place] [address]   Aerobic/Anaerobic Culture w Gram Stain (surgical/deep wound)     Status: None   Collection Time: 11/20/20  5:08 PM   Specimen: PATH Bone biopsy; Tissue  Result Value Ref Range Status   Specimen Description   Final    HEEL Performed at [facility], [address] [place], [place] [address]    Special Requests   Final    LEFT HEEL BONE CULTURE Performed at [facility], [place], [place] [address]    Gram Stain NO WBC SEEN RARE GRAM NEGATIVE RODS   Final   Culture   Final    RARE PSEUDOMONAS  AERUGINOSA NO ANAEROBES ISOLATED Performed at [facility], [place] [address], [place], [place] [address]    Report Status 11/25/2020 FINAL  Final   Organism ID, Bacteria PSEUDOMONAS AERUGINOSA  Final      Susceptibility   Pseudomonas aeruginosa - MIC*    CEFTAZIDIME 4 SENSITIVE Sensitive     CIPROFLOXACIN <=0.25 SENSITIVE Sensitive     GENTAMICIN <=1 SENSITIVE Sensitive     IMIPENEM 2 SENSITIVE Sensitive     PIP/TAZO 16 SENSITIVE Sensitive     CEFEPIME 4 SENSITIVE Sensitive     * RARE PSEUDOMONAS AERUGINOSA  Aerobic/Anaerobic Culture w Gram Stain (surgical/deep wound)     Status: None   Collection Time: 11/20/20  5:08 PM   Specimen: PATH Bone biopsy; Tissue  Result Value Ref Range Status   Specimen Description   Final    WOUND Performed at [facility], [address], [place], [place] [address]    Special Requests   Final    LEFT HEEL WD CULTURE Performed at [facility] [address], [place], [place] [address]    Gram Stain NO WBC SEEN NO ORGANISMS SEEN   Final   Culture   Final    No growth aerobically or anaerobically. Performed at [facility], [place] [address], [place], [place] [address]    Report Status 11/25/2020 FINAL  Final     Imaging Studies  No results found. Medications   Scheduled Meds:  acetaminophen  1,000 mg Oral TID   atorvastatin  20 mg Oral Daily   Chlorhexidine Gluconate Cloth  6 each Topical Daily   enoxaparin (  LOVENOX) injection  40 mg Subcutaneous Q24H   insulin aspart  0-5 Units Subcutaneous QHS   insulin aspart  0-9 Units Subcutaneous TID WC   insulin glargine-yfgn  35 Units Subcutaneous QHS   metoprolol succinate  25 mg Oral Daily   oxybutynin  10 mg Oral QHS   pantoprazole  80 mg Oral QAC breakfast   polyethylene glycol  17 g Oral Daily   senna  1 tablet Oral Daily   sodium chloride flush  10-40 mL Intracatheter Q12H   vitamin B-12  1,000 mcg Oral Daily   Continuous Infusions:  sodium chloride 1,000  mL (11/21/20 2136)   ceFEPime (MAXIPIME) IV 2 g (11/25/20 2317)   DAPTOmycin (CUBICIN)  IV Stopped (11/25/20 2257)     LOS: 8 days   Time spent: >15min   Richarda Osmond, DO [facility] 11/26/2020, 7:25 AM   To contact the [facility] Attending or Consulting provider for this patient: Check the care team in [facility] for a) attending/consulting [place] provider listed and b) the [facility] team listed Log into [email or web address] and use [place]'s universal password to access. If you do not have the password, please contact the hospital operator. Locate the [facility] provider you are looking for under [facility] and page to a number that you can be directly reached. If you still have difficulty reaching the provider, please page the [facility] (Director on Call) for the Hospitalists listed on amion for assistance.

## 2020-11-26 NOTE — Progress Notes (Signed)
Wound VAC removed. POD 6 from debridement, antibiotic bead placement, VAC of left LE Achilles.   No cellulitis. SS drainage in canister. Calcium sulfate beads in place under sponge. Minor maceration     Would continue VAC at this point given depth and location. Reapply VAC in 48 hours and continue at discharge. Gauze dressing change tomorrow    Lanae Crumbly, [facility] 11/26/2020

## 2020-11-26 NOTE — Progress Notes (Signed)
Inpatient Diabetes Program Recommendations  AACE/ADA: New Consensus Statement on Inpatient Glycemic Control (2015)  Target Ranges:  Prepandial:   less than 140 mg/dL      Peak postprandial:   less than 180 mg/dL (1-2 hours)      Critically ill patients:  140 - 180 mg/dL   Lab Results  Component Value Date   GLUCAP 100 (H) 11/26/2020   HGBA1C 7.5 (H) 11/17/2020    Review of Glycemic Control Results for EDONA, SCHREFFLER (MRN [id number]) as of 11/26/2020 10:25  Ref. Range 11/25/2020 09:09 11/25/2020 12:36 11/25/2020 16:29 11/25/2020 20:12 11/26/2020 08:09 11/26/2020 08:38  Glucose-Capillary Latest Ref Range: 70 - 99 mg/dL 114 (H) 101 (H) 168 (H) 133 (H) 62 (L) 100 (H)   Inpatient Diabetes Program Recommendations:   Fasting CBG 62. -Decrease Semglee to 15 units qhs. Secure chat sent to Dr. Ouida Sills.  Thank you, Nani Gasser. Hideo Googe, RN, MSN, CDE  Diabetes Coordinator Inpatient Glycemic Control Team Team Pager [phone] (8am-5pm) 11/26/2020 10:27 AM

## 2020-11-26 NOTE — Progress Notes (Signed)
Physical Therapy Treatment Patient Details Name: Andrea [name] MRN: [id number] DOB: 09/29/1945 Today's Date: 11/26/2020   History of Present Illness 75 y.o. female with medical history significant for insulin-dependent diabetes mellitus, hypertension, hyperlipidemia, GERD, history of meningitis, CAD, COPD, history of TIA, grade 1 diastolic dysfunction, depression, anxiety, osteoarthritis, who presents to the emergency department from home on 11/17/2020 at the request of her PCP for chief concerns of osteomyelitis of the left heel.  L hip surgery on 08/19/2020. Underwent debridement of heel ulcer with partial calcanectomy and placement of antibiotic beads with application of wound vac left on 11/20/2020. NWB L LE.    PT Comments    Pt continues to be very pleasant and eager to work with PT.  She is, however, functionally limited due to her NWBing status on the R and lack of UE strength/stamina for any prolonged upright activity.  She did well with lightly resisted LE exercises, continues to have some pain with even light L LE palpation but overall doing well and showing good effort. Pt continues to require STR at d/c.  Recommendations for follow up therapy are one component of a multi-disciplinary discharge planning process, led by the attending physician.  Recommendations may be updated based on patient status, additional functional criteria and insurance authorization.  Follow Up Recommendations  SNF;Supervision for mobility/OOB     Equipment Recommendations  3in1 (PT)    Recommendations for Other Services       Precautions / Restrictions Precautions Precautions: Fall Restrictions LLE Weight Bearing: Non weight bearing     Mobility  Bed Mobility               General bed mobility comments: in recliner on arrival    Transfers Overall transfer level: Needs assistance Equipment used: Rolling walker (2 wheeled) Transfers: Sit to/from Stand Sit to Stand: Min assist;Min  guard         General transfer comment: Pt able to rise sit to stand 4x during session with good effort and ability to keep weight off the heel, though with increased time/effort she did start putting more weight onto the forefoot and needed increased cuing/encouragement to insure heel remains pressure free.  Ambulation/Gait Ambulation/Gait assistance: Mod assist Gait Distance (Feet): 3 Feet Assistive device: Rolling walker (2 wheeled)       General Gait Details: Pt was able to take ~8 very small, labored "hops" forward with the walker.  She again was able to keep weight off the L heel but did appear to put at least some weight through the ball of the L foot, though she assures me she did not put much through at all...  Gait remains non-functional but she was pleased she was actually able to advance a little, chair follow and did need to sit with relatively quick fatigue.   Stairs             Wheelchair Mobility    Modified Rankin (Stroke Patients Only)       Balance Overall balance assessment: Needs assistance Sitting-balance support: No upper extremity supported;Feet supported Sitting balance-Leahy Scale: Good     Standing balance support: Bilateral upper extremity supported Standing balance-Leahy Scale: Fair Standing balance comment: not falling backward, etc but struggled with NWBing, activity tolerance and general lack of confidence in standing                            Cognition Arousal/Alertness: Awake/alert Behavior During Therapy: [facility] for tasks  assessed/performed Overall Cognitive Status: Within Functional Limits for tasks assessed                                        Exercises General Exercises - Lower Extremity Ankle Circles/Pumps: AROM;10 reps Long Arc Quad: Strengthening;10 reps Heel Slides: Strengthening;10 reps (resisted leg ext on R only) Hip ABduction/ADduction: Strengthening;10 reps Straight Leg Raises: AROM;10  reps Hip Flexion/Marching: Strengthening;10 reps    General Comments        Pertinent Vitals/Pain Pain Assessment: 0-10 Pain Score: 5  (reports sore sacral area (nursing aware) as well as continued L foot/heel pain)    Home Living                      Prior Function            PT Goals (current goals can now be found in the care plan section) Progress towards PT goals: Progressing toward goals    Frequency    7X/week      PT Plan Current plan remains appropriate    Co-evaluation              AM-PAC PT "6 Clicks" Mobility   Outcome Measure  Help needed turning from your back to your side while in a flat bed without using bedrails?: None Help needed moving from lying on your back to sitting on the side of a flat bed without using bedrails?: A Little Help needed moving to and from a bed to a chair (including a wheelchair)?: A Little Help needed standing up from a chair using your arms (e.g., wheelchair or bedside chair)?: A Little Help needed to walk in hospital room?: Total Help needed climbing 3-5 steps with a railing? : Total 6 Click Score: 15    End of Session Equipment Utilized During Treatment: Gait belt Activity Tolerance: Patient limited by pain Patient left: in chair;with chair alarm set;with call bell/phone within reach Nurse Communication: Mobility status PT Visit Diagnosis: Unsteadiness on feet (R26.81);Other abnormalities of gait and mobility (R26.89);Muscle weakness (generalized) (M62.81);Difficulty in walking, not elsewhere classified (R26.2)     Time: 2094-7096 PT Time Calculation (min) (ACUTE ONLY): 28 min  Charges:  $Therapeutic Exercise: 8-22 mins $Therapeutic Activity: 8-22 mins                     Kreg Shropshire, DPT 11/26/2020, 1:51 PM

## 2020-11-26 NOTE — Significant Event (Signed)
Hypoglycemic Event  CBG: 62    Treatment: 4 oz juice/soda  Symptoms: None  Follow-up CBG: Time:0825 CBG Result:100   Possible Reasons for Event: Inadequate meal intake  Comments/MD notified:Dr Ouida Sills    Malaquias Lenker J Sanjiv Castorena

## 2020-11-27 ENCOUNTER — Ambulatory Visit: Payer: Medicare Other | Admitting: Podiatry

## 2020-11-27 ENCOUNTER — Telehealth: Payer: Self-pay | Admitting: Podiatry

## 2020-11-27 ENCOUNTER — Ambulatory Visit: Payer: Medicare Other | Admitting: Physician Assistant

## 2020-11-27 DIAGNOSIS — E7800 Pure hypercholesterolemia, unspecified: Secondary | ICD-10-CM

## 2020-11-27 DIAGNOSIS — Z95828 Presence of other vascular implants and grafts: Secondary | ICD-10-CM

## 2020-11-27 DIAGNOSIS — J42 Unspecified chronic bronchitis: Secondary | ICD-10-CM

## 2020-11-27 LAB — BASIC METABOLIC PANEL
Anion gap: 4 — ABNORMAL LOW (ref 5–15)
BUN: 19 mg/dL (ref 8–23)
CO2: 24 mmol/L (ref 22–32)
Calcium: 9.2 mg/dL (ref 8.9–10.3)
Chloride: 111 mmol/L (ref 98–111)
Creatinine, Ser: 0.91 mg/dL (ref 0.44–1.00)
GFR, Estimated: >60 mL/min (ref >60)
Glucose, Bld: 90 mg/dL (ref 70–99)
Potassium: 4.6 mmol/L (ref 3.5–5.1)
Sodium: 139 mmol/L (ref 135–145)

## 2020-11-27 LAB — BPAM RBC
Blood Product Expiration Date: 202209262359
ISSUE DATE / TIME: 202209211427
Unit Type and Rh: 600

## 2020-11-27 LAB — CBC
HCT: 30.1 % — ABNORMAL LOW (ref 36.0–46.0)
Hemoglobin: 10 g/dL — ABNORMAL LOW (ref 12.0–15.0)
MCH: 29.5 pg (ref 26.0–34.0)
MCHC: 33.2 g/dL (ref 30.0–36.0)
MCV: 88.8 fL (ref 80.0–100.0)
Platelets: 181 10*3/uL (ref 150–400)
RBC: 3.39 MIL/uL — ABNORMAL LOW (ref 3.87–5.11)
RDW: 16.3 % — ABNORMAL HIGH (ref 11.5–15.5)
WBC: 6.8 10*3/uL (ref 4.0–10.5)
nRBC: 0 % (ref 0.0–0.2)

## 2020-11-27 LAB — TYPE AND SCREEN
ABO/RH(D): A POS
Antibody Screen: NEGATIVE
Unit division: 0

## 2020-11-27 LAB — GLUCOSE, CAPILLARY
Glucose-Capillary: 101 mg/dL — ABNORMAL HIGH (ref 70–99)
Glucose-Capillary: 132 mg/dL — ABNORMAL HIGH (ref 70–99)
Glucose-Capillary: 228 mg/dL — ABNORMAL HIGH (ref 70–99)
Glucose-Capillary: 184 mg/dL — ABNORMAL HIGH (ref 70–99)

## 2020-11-27 MED ORDER — LOPERAMIDE HCL 2 MG PO CAPS
2.0000 mg | ORAL_CAPSULE | Freq: Every day | ORAL | Status: DC | PRN
  Administered 2020-11-27: 2 mg via ORAL
  Filled 2020-11-27: qty 1

## 2020-11-27 MED ORDER — INSULIN ASPART 100 UNIT/ML IJ SOLN
0.0000 [IU] | Freq: Three times a day (TID) | INTRAMUSCULAR | Status: DC
Start: 2020-11-27 — End: 2020-12-01
  Administered 2020-11-27: 2 [IU] via SUBCUTANEOUS
  Administered 2020-11-28 – 2020-12-01 (×6): 1 [IU] via SUBCUTANEOUS
  Filled 2020-11-27 (×7): qty 1

## 2020-11-27 NOTE — Telephone Encounter (Signed)
Patient daughter called the office stating the wound vac was removed yesterday in the hospital , she wants to know what is the plan as far as the wound vac , will it be put back on?  And she also wants to know what is your  opinion about her mother's wound and if it is progressing.

## 2020-11-27 NOTE — Progress Notes (Signed)
PROGRESS NOTE  [name]    DOB: 1945-06-15, 75 y.o.  EHU:[id number]  PCP: [name], FNP   Code Status: Full Code   DOA: 11/17/2020   LOS: 9  Brief Narrative of Current Hospitalization  Andrea [name] is a 75 y.o. female with a PMH significant for insulin-dependent diabetes mellitus, hypertension, hyperlipidemia, GERD, history of meningitis, CAD, COPD, history of TIA, grade 1 diastolic dysfunction, depression, anxiety, osteoarthritis. They presented from home to the ED on 11/17/2020 with wound of left heel x3 months. In the ED, it was found that they had osteomyelitis. They were treated with IV antibiotics.  Patient was admitted to medicine service for further workup and management of osteomyelitis as outlined in detail below.  11/27/20 -stable and awaiting discharge to SNF. Wound vac has been removed.  Assessment & Plan  Principal Problem:   Osteomyelitis ([place]) Active Problems:   Chronic obstructive pulmonary disease (HCC)   HLD (hyperlipidemia)   Coronary artery disease involving native coronary artery   PAD (peripheral artery disease) (HCC)   History of meningitis   Status post total hip replacement, left   Cellulitis of left lower extremity   Left heel osteomyelitis- s/p debridement and partial calcectomy 9/15.   -Continue IV daptomycin and cefepime for 4 weeks total, per ID  - weekly CK monitoring on daptomycin -Patient stable for discharge to SNF once bed has been found -Follow-up with Dr. Amalia Hailey (podiatry) in 2 weeks -Oxycodone as needed for pain -Bowel regimen has been discontinued due to diarrhea with antibiotic use. Imodium PRN.   AKI-secondary to medications.  Resolved   Anemia-improved. Hgb 10.0 today s/p 1 unit pRBCs yesterday. -Follow-up CBC and in a.m.  Type 2 diabetes-fasting glucose this morning was 90. Hemoglobin A1c was 7.5 on admission which is well below goal of 8 for her age. -Discontinued nighttime short acting insulin -Decreased  long-acting insulin to 20 units -Continue CBG monitoring with meals for very sensitive short acting sliding scale  HTN-stable. elevated systolics but low diastolics.  Patient is asymptomatic. -Continue home metoprolol succinate 25 mg daily  HLD- - continue Lipitor at 20 mg while on daptomycin.  Overactive bladder-Home meds include oxybutynin -Continue home med but recommend discontinuing by PCP  DVT prophylaxis: enoxaparin (LOVENOX) injection 40 mg Start: 11/21/20 1000 Place TED hose Start: 11/17/20 2033   Diet:  Diet Orders (From admission, onward)     Start     Ordered   11/20/20 1917  Diet Carb Modified Fluid consistency: Thin; Room service appropriate? Yes  Diet effective now       Question Answer Comment  Calorie Level Medium 1600-2000   Fluid consistency: Thin   Room service appropriate? Yes      11/20/20 1916            Subjective 11/27/20    Patient doing well overall. She has been having frequent diarrhea.   Disposition Plan & Communication  Status is: Inpatient  Remains inpatient appropriate because:Unsafe d/c plan  Dispo: The patient is from: Home              Anticipated d/c is to: SNF              Patient currently is medically stable to d/c.   Difficult to place patient Yes   Family Communication: N/A  Consults, Procedures, Significant Events  Consultants:  Infectious disease Podiatry  Procedures/significant events:  .  Debridement of left heel 9/15  Antimicrobials:  Anti-infectives (From admission, onward)  Start     Dose/Rate Route Frequency Ordered Stop   11/24/20 1600  DAPTOmycin (CUBICIN) 400 mg in sodium chloride 0.9 % IVPB        6 mg/kg  62.6 kg 116 mL/hr over 30 Minutes Intravenous Daily 11/24/20 1411     11/21/20 1400  ceFEPIme (MAXIPIME) 2 g in sodium chloride 0.9 % 100 mL IVPB        2 g 200 mL/hr over 30 Minutes Intravenous Every 12 hours 11/21/20 0730     11/21/20 0141  ceFEPIme (MAXIPIME) 2 g in sodium chloride 0.9 %  100 mL IVPB  Status:  Discontinued        2 g 200 mL/hr over 30 Minutes Intravenous Every 24 hours 11/20/20 0923 11/21/20 0730   11/20/20 1800  linezolid (ZYVOX) IVPB 600 mg  Status:  Discontinued        600 mg 300 mL/hr over 60 Minutes Intravenous Every 12 hours 11/20/20 0915 11/24/20 1317   11/20/20 1702  vancomycin (VANCOCIN) powder  Status:  Discontinued          As needed 11/20/20 1702 11/20/20 1743   11/20/20 1700  tobramycin (NEBCIN) powder  Status:  Discontinued          As needed 11/20/20 1701 11/20/20 1743   11/19/20 2200  vancomycin (VANCOCIN) IVPB 1000 mg/200 mL premix  Status:  Discontinued        1,000 mg 200 mL/hr over 60 Minutes Intravenous Every 24 hours 11/19/20 0934 11/20/20 0915   11/19/20 0100  vancomycin (VANCOREADY) IVPB 750 mg/150 mL  Status:  Discontinued        750 mg 150 mL/hr over 60 Minutes Intravenous Every 24 hours 11/18/20 0028 11/19/20 0934   11/18/20 0200  ceFEPIme (MAXIPIME) 2 g in sodium chloride 0.9 % 100 mL IVPB  Status:  Discontinued        2 g 200 mL/hr over 30 Minutes Intravenous Every 12 hours 11/17/20 2347 11/20/20 0923   11/18/20 0100  vancomycin (VANCOREADY) IVPB 1500 mg/300 mL        1,500 mg 150 mL/hr over 120 Minutes Intravenous  Once 11/17/20 2347 11/18/20 0238        Objective   Vitals:   11/26/20 1500 11/26/20 1945 11/26/20 2318 11/27/20 0554  BP: (!) 124/50 (!) 138/58 137/62 (!) 144/66  Pulse: 91 88 93 97  Resp: 16 16 16 16   Temp: 98.4 F (36.9 C) 98 F (36.7 C) 98.2 F (36.8 C) 98 F (36.7 C)  TempSrc: Oral Oral Oral Oral  SpO2: 100% 100% 100% 99%  Weight:      Height:        Intake/Output Summary (Last 24 hours) at 11/27/2020 0728 Last data filed at 11/27/2020 0711 Gross per 24 hour  Intake 378 ml  Output 2250 ml  Net -1872 ml    Filed Weights   11/17/20 1744 11/20/20 1538  Weight: 62.6 kg 62.6 kg    Patient BMI: Body mass index is 26.95 kg/m.   Physical Exam: General: awake, alert, NAD, sitting in  chair HEENT: atraumatic, clear conjunctiva, anicteric sclera, moist mucus membranes, hearing grossly normal Respiratory: normal respiratory effort. Cardiovascular: quick capillary refill  Gastrointestinal: soft, NT, ND, no HSM felt Nervous: A&O x4. no gross focal neurologic deficits, normal speech Extremities: moves all equally, no edema, normal tone Skin: dry, intact, normal temperature, normal color, No rashes, lesions or ulcers. Clean dressing Psychiatry: normal mood, congruent affect  Labs   I have personally  reviewed following labs and imaging studies No results displayed because visit has over 200 results.       Recent Results (from the past 240 hour(s))  Resp Panel by RT-PCR (Flu A&B, Covid) Nasopharyngeal Swab     Status: None   Collection Time: 11/17/20  7:45 AM   Specimen: Nasopharyngeal Swab; Nasopharyngeal(NP) swabs in vial transport medium  Result Value Ref Range Status   SARS Coronavirus 2 by RT PCR NEGATIVE NEGATIVE Final    Comment: (NOTE) SARS-CoV-2 target nucleic acids are NOT DETECTED.  The SARS-CoV-2 RNA is generally detectable in upper respiratory specimens during the acute phase of infection. The lowest concentration of SARS-CoV-2 viral copies this assay can detect is 138 copies/mL. A negative result does not preclude SARS-Cov-2 infection and should not be used as the sole basis for treatment or other patient management decisions. A negative result may occur with  improper specimen collection/handling, submission of specimen other than nasopharyngeal swab, presence of viral mutation(s) within the areas targeted by this assay, and inadequate number of viral copies(<138 copies/mL). A negative result must be combined with clinical observations, patient history, and epidemiological information. The expected result is Negative.  Fact Sheet for Patients:  EntrepreneurPulse.com.au  Fact Sheet for Healthcare Providers:   IncredibleEmployment.be  This test is no t yet approved or cleared by the [place] FDA and  has been authorized for detection and/or diagnosis of SARS-CoV-2 by FDA under an Emergency Use Authorization (EUA). This EUA will remain  in effect (meaning this test can be used) for the duration of the COVID-19 declaration under Section 564(b)(1) of the Act, 21 U.S.C.section 360bbb-3(b)(1), unless the authorization is terminated  or revoked sooner.       Influenza A by PCR NEGATIVE NEGATIVE Final   Influenza B by PCR NEGATIVE NEGATIVE Final    Comment: (NOTE) The Xpert Xpress SARS-CoV-2/FLU/RSV plus assay is intended as an aid in the diagnosis of influenza from Nasopharyngeal swab specimens and should not be used as a sole basis for treatment. Nasal washings and aspirates are unacceptable for Xpert Xpress SARS-CoV-2/FLU/RSV testing.  Fact Sheet for Patients: EntrepreneurPulse.com.au  Fact Sheet for Healthcare Providers: IncredibleEmployment.be  This test is not yet approved or cleared by the [place] FDA and has been authorized for detection and/or diagnosis of SARS-CoV-2 by FDA under an Emergency Use Authorization (EUA). This EUA will remain in effect (meaning this test can be used) for the duration of the COVID-19 declaration under Section 564(b)(1) of the Act, 21 U.S.C. section 360bbb-3(b)(1), unless the authorization is terminated or revoked.  Performed at [facility], [address] [place], [place] [address]   CULTURE, BLOOD (ROUTINE X 2) w Reflex to ID Panel     Status: None   Collection Time: 11/17/20 10:34 PM   Specimen: BLOOD  Result Value Ref Range Status   Specimen Description BLOOD LEFT FOREARM  Final   Special Requests   Final    BOTTLES DRAWN AEROBIC AND ANAEROBIC Blood Culture adequate volume   Culture   Final    NO GROWTH 5 DAYS Performed at [facility], [address] [place], [place] [address]    Report Status 11/22/2020 FINAL  Final  CULTURE, BLOOD (ROUTINE X 2) w Reflex to ID Panel     Status: None   Collection Time: 11/17/20 10:35 PM   Specimen: BLOOD  Result Value Ref Range Status   Specimen Description BLOOD RIGHT FOREARM  Final   Special Requests   Final  BOTTLES DRAWN AEROBIC ONLY Blood Culture adequate volume   Culture   Final    NO GROWTH 5 DAYS Performed at [facility], [address] [place], [place] [address]    Report Status 11/22/2020 FINAL  Final  Surgical pcr screen     Status: None   Collection Time: 11/19/20 12:41 PM   Specimen: Nasal Mucosa; Nasal Swab  Result Value Ref Range Status   MRSA, PCR NEGATIVE NEGATIVE Final   Staphylococcus aureus NEGATIVE NEGATIVE Final    Comment: (NOTE) The Xpert SA Assay (FDA approved for NASAL specimens in patients 22 years of age and older), is one component of a comprehensive surveillance program. It is not intended to diagnose infection nor to guide or monitor treatment. Performed at [facility], [place], [place] [address]   Aerobic/Anaerobic Culture w Gram Stain (surgical/deep wound)     Status: None   Collection Time: 11/20/20  5:08 PM   Specimen: PATH Bone biopsy; Tissue  Result Value Ref Range Status   Specimen Description   Final    HEEL Performed at [facility], [address] [place], [place] [address]    Special Requests   Final    LEFT HEEL BONE CULTURE Performed at [facility], [place], [place] [address]    Gram Stain NO WBC SEEN RARE GRAM NEGATIVE RODS   Final   Culture   Final    RARE PSEUDOMONAS AERUGINOSA NO ANAEROBES ISOLATED Performed at [facility], [place] [address] [place], [place] [address]    Report Status 11/25/2020 FINAL  Final   Organism ID, Bacteria PSEUDOMONAS AERUGINOSA  Final      Susceptibility   Pseudomonas aeruginosa - MIC*    CEFTAZIDIME 4 SENSITIVE Sensitive      CIPROFLOXACIN <=0.25 SENSITIVE Sensitive     GENTAMICIN <=1 SENSITIVE Sensitive     IMIPENEM 2 SENSITIVE Sensitive     PIP/TAZO 16 SENSITIVE Sensitive     CEFEPIME 4 SENSITIVE Sensitive     * RARE PSEUDOMONAS AERUGINOSA  Aerobic/Anaerobic Culture w Gram Stain (surgical/deep wound)     Status: None   Collection Time: 11/20/20  5:08 PM   Specimen: PATH Bone biopsy; Tissue  Result Value Ref Range Status   Specimen Description   Final    WOUND Performed at [facility], [address] [place], [place] [address]    Special Requests   Final    LEFT HEEL WD CULTURE Performed at [facility], [place], [place] [address]    Gram Stain NO WBC SEEN NO ORGANISMS SEEN   Final   Culture   Final    No growth aerobically or anaerobically. Performed at [facility], [place] [address] [place], [place] [address]    Report Status 11/25/2020 FINAL  Final     Imaging Studies  No results found. Medications   Scheduled Meds:  acetaminophen  1,000 mg Oral TID   atorvastatin  20 mg Oral Daily   Chlorhexidine Gluconate Cloth  6 each Topical Daily   enoxaparin (LOVENOX) injection  40 mg Subcutaneous Q24H   insulin aspart  0-9 Units Subcutaneous TID WC   insulin glargine-yfgn  20 Units Subcutaneous Daily   metoprolol succinate  25 mg Oral Daily   oxybutynin  10 mg Oral QHS   pantoprazole  80 mg Oral QAC breakfast   polyethylene glycol  17 g Oral Daily   senna  1  tablet Oral Daily   sodium chloride flush  10-40 mL Intracatheter Q12H   vitamin B-12  1,000 mcg Oral Daily   Continuous Infusions:  sodium chloride 1,000 mL (11/21/20 2136)   ceFEPime (MAXIPIME) IV 2 g (11/26/20 2224)   DAPTOmycin (CUBICIN)  IV 400 mg (11/26/20 2014)     LOS: 9 days   Time spent: >63min   Trinton Prewitt L [place], DO [facility] 11/27/2020, 7:28 AM   To contact the [facility] Attending or Consulting provider for this patient: Check the care team in [facility] for a)  attending/consulting [place] provider listed and b) the [facility] team listed Log into [email or web address] and use [place]'s universal password to access. If you do not have the password, please contact the hospital operator. Locate the [facility] provider you are looking for under [facility] and page to a number that you can be directly reached. If you still have difficulty reaching the provider, please page the [place] [facility] (Director on Call) for the Hospitalists listed on amion for assistance.

## 2020-11-27 NOTE — Plan of Care (Signed)
No acute events during the night. NAD noted. VSS. PRN oxycodone administered x 2 for pain.  Problem: Education: Goal: Knowledge of General Education information will improve Description: Including pain rating scale, medication(s)/side effects and non-pharmacologic comfort measures Outcome: Progressing   Problem: Health Behavior/Discharge Planning: Goal: Ability to manage health-related needs will improve Outcome: Progressing   Problem: Clinical Measurements: Goal: Ability to maintain clinical measurements within normal limits will improve Outcome: Progressing Goal: Will remain free from infection Outcome: Progressing   Problem: Activity: Goal: Risk for activity intolerance will decrease Outcome: Progressing   Problem: Nutrition: Goal: Adequate nutrition will be maintained Outcome: Progressing

## 2020-11-27 NOTE — Care Management Important Message (Signed)
Important Message  Patient Details  Name: Andrea [name] MRN: [id number] Date of Birth: 11/15/1945   Medicare Important Message Given:  Yes     Juliann Pulse A Laverta Harnisch 11/27/2020, 3:48 PM

## 2020-11-27 NOTE — TOC Progression Note (Signed)
Transition of Care [facility]) - Progression Note    Patient Details  Name: Andrea [name] MRN: [id number] Date of Birth: 05-Mar-1946  Transition of Care [facility]) CM/SW [place], RN Phone Number: 11/27/2020, 10:55 AM  Clinical Narrative:   Patient has an offer to go to [facility], patient and family refuses.  [employer] states patient will be a copay of $188 per day  Daughter notified, wants to speak to [employer] and then get back to me.  TOC contact information given to daughter.         Expected Discharge Plan and Services                                                 Social Determinants of Health (SDOH) Interventions    Readmission Risk Interventions Readmission Risk Prevention Plan 08/21/2020  Transportation Screening Complete  PCP or Specialist Appt within 5-7 Days Complete  Home Care Screening Complete  Medication Review (RN CM) Complete  Some recent data might be hidden

## 2020-11-27 NOTE — Progress Notes (Addendum)
Physical Therapy Treatment Patient Details Name: Andrea [name] MRN: [id number] DOB: 11/09/1945 Today's Date: 11/27/2020   History of Present Illness 75 y.o. female with medical history significant for insulin-dependent diabetes mellitus, hypertension, hyperlipidemia, GERD, history of meningitis, CAD, COPD, history of TIA, grade 1 diastolic dysfunction, depression, anxiety, osteoarthritis, who presents to the emergency department from home on 11/17/2020 at the request of her PCP for chief concerns of osteomyelitis of the left heel.  L hip surgery on 08/19/2020. Underwent debridement of heel ulcer with partial calcanectomy and placement of antibiotic beads with application of wound vac left on 11/20/2020. NWB L LE.    PT Comments    Pt reports frequent loose BM today needing to be cleaned multiple times.  Stated nursing suggest she stay in bed today so care can be performed as needed.  Participated in exercises as described below.  She agrees to standing EOB but is returned to bed.  Small loose BM with activity and care provided after mobility.  Discussed with primary PT.  Pt is getting OOB daily with nursing staff.  Gait remains limited to primarily transfers given NWB status.  Will decrease to 2 x per week session but the goal to see  pt more frequently as schedule allows.   Recommendations for follow up therapy are one component of a multi-disciplinary discharge planning process, led by the attending physician.  Recommendations may be updated based on patient status, additional functional criteria and insurance authorization.  Follow Up Recommendations  SNF     Equipment Recommendations  3in1 (PT)    Recommendations for Other Services       Precautions / Restrictions Precautions Precautions: Fall Restrictions Weight Bearing Restrictions: Yes LLE Weight Bearing: Non weight bearing     Mobility  Bed Mobility Overal bed mobility: Modified Independent Bed Mobility: Rolling;Supine to  Sit;Sit to Supine Rolling: Modified independent (Device/Increase time)   Supine to sit: Min guard Sit to supine: Min guard        Transfers Overall transfer level: Needs assistance Equipment used: Rolling walker (2 wheeled) Transfers: Sit to/from Stand Sit to Stand: Min assist            Ambulation/Gait Ambulation/Gait assistance: Min assist;Mod assist Gait Distance (Feet): 2 Feet Assistive device: Rolling walker (2 wheeled) Gait Pattern/deviations: Step-to pattern     General Gait Details: side step/shuffle up to [facility] for bettr positioning   Stairs             Wheelchair Mobility    Modified Rankin (Stroke Patients Only)       Balance Overall balance assessment: Needs assistance Sitting-balance support: No upper extremity supported;Feet supported Sitting balance-Leahy Scale: Good     Standing balance support: Bilateral upper extremity supported Standing balance-Leahy Scale: Fair Standing balance comment: not falling backward, etc but struggled with NWBing, activity tolerance and general lack of confidence in standing                            Cognition Arousal/Alertness: Awake/alert Behavior During Therapy: WFL for tasks assessed/performed Overall Cognitive Status: Within Functional Limits for tasks assessed                                        Exercises Other Exercises Other Exercises: supine 2 x 10 for BLE - care to prevent sheering and WB L heel during ex  General Comments        Pertinent Vitals/Pain Pain Assessment: Faces Faces Pain Scale: Hurts little more Pain Location: L foot Pain Descriptors / Indicators: Discomfort;Aching;Throbbing Pain Intervention(s): Limited activity within patient's tolerance;Monitored during session    Home Living                      Prior Function            PT Goals (current goals can now be found in the care plan section) Progress towards PT goals:  Progressing toward goals    Frequency    Min 2X/week      PT Plan Frequency needs to be updated    Co-evaluation              AM-PAC PT "6 Clicks" Mobility   Outcome Measure  Help needed turning from your back to your side while in a flat bed without using bedrails?: None Help needed moving from lying on your back to sitting on the side of a flat bed without using bedrails?: A Little Help needed moving to and from a bed to a chair (including a wheelchair)?: A Little Help needed standing up from a chair using your arms (e.g., wheelchair or bedside chair)?: A Little Help needed to walk in hospital room?: Total Help needed climbing 3-5 steps with a railing? : Total 6 Click Score: 15    End of Session Equipment Utilized During Treatment: Gait belt Activity Tolerance: Patient tolerated treatment well Patient left: in bed;with call bell/phone within reach;with bed alarm set Nurse Communication: Mobility status PT Visit Diagnosis: Unsteadiness on feet (R26.81);Other abnormalities of gait and mobility (R26.89);Muscle weakness (generalized) (M62.81);Difficulty in walking, not elsewhere classified (R26.2)     Time: 7124-5809 PT Time Calculation (min) (ACUTE ONLY): 27 min  Charges:  $Therapeutic Exercise: 8-22 mins $Therapeutic Activity: 8-22 mins                    Chesley Noon, PTA 11/27/20, 12:44 PM

## 2020-11-27 NOTE — TOC Progression Note (Signed)
Transition of Care [facility]) - Progression Note    Patient Details  Name: Andrea [name] MRN: [id number] Date of Birth: September 17, 1945  Transition of Care [facility]) CM/SW [place], RN Phone Number: 11/27/2020, 3:41 PM  Clinical Narrative:     Reached out to [facility] and requested that they review for a bed offer, the request was sent out to them via the Hub, Awaiting a responce       Expected Discharge Plan and Services                                                 Social Determinants of Health (SDOH) Interventions    Readmission Risk Interventions Readmission Risk Prevention Plan 08/21/2020  Transportation Screening Complete  PCP or Specialist Appt within 5-7 Days Complete  Home Care Screening Complete  Medication Review (RN CM) Complete  Some recent data might be hidden

## 2020-11-28 DIAGNOSIS — L97423 Non-pressure chronic ulcer of left heel and midfoot with necrosis of muscle: Secondary | ICD-10-CM

## 2020-11-28 LAB — CBC
HCT: 31.2 % — ABNORMAL LOW (ref 36.0–46.0)
Hemoglobin: 10.3 g/dL — ABNORMAL LOW (ref 12.0–15.0)
MCH: 28.8 pg (ref 26.0–34.0)
MCHC: 33 g/dL (ref 30.0–36.0)
MCV: 87.2 fL (ref 80.0–100.0)
Platelets: 183 10*3/uL (ref 150–400)
RBC: 3.58 MIL/uL — ABNORMAL LOW (ref 3.87–5.11)
RDW: 16.4 % — ABNORMAL HIGH (ref 11.5–15.5)
WBC: 6.1 10*3/uL (ref 4.0–10.5)
nRBC: 0 % (ref 0.0–0.2)

## 2020-11-28 LAB — GLUCOSE, CAPILLARY
Glucose-Capillary: 88 mg/dL (ref 70–99)
Glucose-Capillary: 148 mg/dL — ABNORMAL HIGH (ref 70–99)
Glucose-Capillary: 183 mg/dL — ABNORMAL HIGH (ref 70–99)
Glucose-Capillary: 167 mg/dL — ABNORMAL HIGH (ref 70–99)

## 2020-11-28 LAB — BASIC METABOLIC PANEL
Anion gap: 6 (ref 5–15)
BUN: 15 mg/dL (ref 8–23)
CO2: 26 mmol/L (ref 22–32)
Calcium: 8.9 mg/dL (ref 8.9–10.3)
Chloride: 105 mmol/L (ref 98–111)
Creatinine, Ser: 0.78 mg/dL (ref 0.44–1.00)
GFR, Estimated: >60 mL/min (ref >60)
Glucose, Bld: 84 mg/dL (ref 70–99)
Potassium: 4.4 mmol/L (ref 3.5–5.1)
Sodium: 137 mmol/L (ref 135–145)

## 2020-11-28 NOTE — TOC Progression Note (Signed)
Transition of Care [facility]) - Progression Note    Patient Details  Name: DAKARI STABLER MRN: [id number] Date of Birth: April 28, 1945  Transition of Care [facility]) CM/SW [place], RN Phone Number: 11/28/2020, 10:03 AM  Clinical Narrative:    The patient was at [facility] previously, I reached out to them to determine how many bed days she has used, awaiting a call back        Expected Discharge Plan and Services                                                 Social Determinants of Health (SDOH) Interventions    Readmission Risk Interventions Readmission Risk Prevention Plan 08/21/2020  Transportation Screening Complete  PCP or Specialist Appt within 5-7 Days Complete  Home Care Screening Complete  Medication Review (RN CM) Complete  Some recent data might be hidden

## 2020-11-28 NOTE — Progress Notes (Signed)
PODIATRY PROGRESS NOTE  NAME Andrea [name] MRN [id number] DOB 11-12-1945 DOA 11/17/2020   Patient s/p debridement of heel ulcer with partial calcanectomy and placement of antibiotic beads with application of wound vac left. DOS: 11/20/2020.  Wound VAC removed 11/26/2020 to allow the maceration around the wound dry.  Currently there is no wound VAC in place.  Patient is currently awaiting placement to [place].  Overall patient is doing well with her daughter present today upon evaluation  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   Depression    Diabetes mellitus type 2, uncomplicated ([place])    Diabetic retinopathy ([place])    Dupuytren contracture 2011   s/p surgery to LEFT hand   Frequent urinary tract infections    GERD (gastroesophageal reflux disease)    Grade I diastolic dysfunction    HTN (hypertension)    Hyperlipidemia, unspecified    Irritable bowel syndrome    Meningitis 11/2019   MI (myocardial infarction) ([place]) 1994   no stent   Neuromuscular disorder (HCC)    nerve pain in back and legs   Osteoporosis    Pneumonia 2021   RBBB    Sepsis ([place]) 11/2019   Skin cancer    TIA (transient ischemic attack) 2014   Vitamin D deficiency, unspecified    Wheezing     CBC Latest Ref Rng & Units 11/28/2020 11/27/2020 11/26/2020  WBC 4.0 - 10.5 K/uL 6.1 6.8 5.4  Hemoglobin 12.0 - 15.0 g/dL 10.3(L) 10.0(L) 7.9(L)  Hematocrit 36.0 - 46.0 % 31.2(L) 30.1(L) 24.5(L)  Platelets 150 - 400 K/uL 183 181 196    BMP Latest Ref Rng & Units 11/28/2020 11/27/2020 11/26/2020  Glucose 70 - 99 mg/dL 84 90 57(L)  BUN 8 - 23 mg/dL 15 19 21   Creatinine 0.44 - 1.00 mg/dL 0.78 0.91 0.92  BUN/Creat Ratio 12 - 28 - - -  Sodium 135 - 145 mmol/L 137  139 138  Potassium 3.5 - 5.1 mmol/L 4.4 4.6 4.3  Chloride 98 - 111 mmol/L 105 111 111  CO2 22 - 32 mmol/L 26 24 25   Calcium 8.9 - 10.3 mg/dL 8.9 9.2 9.1      Physical Exam: General: The patient is alert and oriented x3 in no acute distress.   Dressings left intact.  Pain tolerated and managed currently with oral Percocet.  Plan for resuming negative pressure wound VAC prior to discharge   ASSESSMENT/PLAN OF CARE S/p partial calcanectomy with placement of antibiotic beads and application of wound vac LT heel. DOS: 11/20/2020 -Resume Wound VAC verbal order placed to [facility] Uric RN.  Wound care nurse will reapply prior to discharge -Recommend antibiotic management discharge as per recommendations from infectious disease.  Greatly appreciated. -Pending placement to [facility] -Nonweightbearing LLE -Will sign off for now.  Follow-up within 2 weeks post discharge in office   Please contact me directly with any questions or concerns.  Cell [phone]   Edrick Kins, DPM [facility]  Dr. Edrick Kins, DPM    [address]  [place], [place] [address]                Office [phone]  Fax [phone]

## 2020-11-28 NOTE — Progress Notes (Signed)
ID Daughter at bedside Patient doing well No specific complaint Wound VAC has been removed because of maceration around the wound [facility]  Patient Vitals for the past 24 hrs:  BP Temp Temp src Pulse Resp SpO2  11/28/20 1948 (!) 116/49 97.8 F (36.6 C) -- 89 19 100 %  11/28/20 1712 (!) 125/56 -- -- 91 16 100 %  11/28/20 0950 (!) 114/44 98.4 F (36.9 C) Oral 69 16 97 %  11/28/20 0416 (!) 125/55 97.9 F (36.6 C) Oral 90 16 98 %  11/27/20 2341 (!) 139/55 97.9 F (36.6 C) Oral (!) 102 16 98 %     Before surgery    Labs CBC Latest Ref Rng & Units 11/28/2020 11/27/2020 11/26/2020  WBC 4.0 - 10.5 K/uL 6.1 6.8 5.4  Hemoglobin 12.0 - 15.0 g/dL 10.3(L) 10.0(L) 7.9(L)  Hematocrit 36.0 - 46.0 % 31.2(L) 30.1(L) 24.5(L)  Platelets 150 - 400 K/uL 183 181 196     CMP Latest Ref Rng & Units 11/28/2020 11/27/2020 11/26/2020  Glucose 70 - 99 mg/dL 84 90 57(L)  BUN 8 - 23 mg/dL 15 19 21   Creatinine 0.44 - 1.00 mg/dL 0.78 0.91 0.92  Sodium 135 - 145 mmol/L 137 139 138  Potassium 3.5 - 5.1 mmol/L 4.4 4.6 4.3  Chloride 98 - 111 mmol/L 105 111 111  CO2 22 - 32 mmol/L 26 24 25   Calcium 8.9 - 10.3 mg/dL 8.9 9.2 9.1  Total Protein 6.5 - 8.1 g/dL - - -  Total Bilirubin 0.3 - 1.2 mg/dL - - -  Alkaline Phos 38 - 126 U/L - - -  AST 15 - 41 U/L - - -  ALT 0 - 44 U/L - - -    Impression/recommendation Left heel ulcer/infected wound.  Status post debridement and partial calcanectomy.  Had a wound VAC which was removed on 11/26/2020 because of maceration around the edge.  Culture from the bone was Pseudomonas. Bone was not sent for histology Patient was on doxycycline and Keflex until she came to the hospital.  So covering just the gram-negative organism Pseudomonas may not be adequate.  Hence patient is on daptomycin and cefepime.  She will get 6 weeks total until 12/28/2020. The dose of atorvastatin has been reduced to 20 mg while on daptomycin.  Need to observe closely for rhabdomyolysis  with weekly CKs. She has a follow-up appointment with me. Discussed with the daughter at bedside and also gave her the treatment/OPAT note  that she would give it to the nursing home as well.   Discussed with podiatrist.

## 2020-11-28 NOTE — TOC Progression Note (Signed)
Transition of Care [facility]) - Progression Note    Patient Details  Name: TANICKA BISAILLON MRN: [id number] Date of Birth: 10-Feb-1946  Transition of Care [facility]) CM/SW [place], RN Phone Number: 11/28/2020, 2:51 PM  Clinical Narrative:     Spoke with the patient's daughter on the phone, She stated that she called the [facility] and was told that she would not have a copay for the SNF stay, I explained to her that she is in the copay days and that unless she has a supplemental policy that covers it, generally there is a Copay for bed days 21-100, she stated that she called them again and was told that she would not have a copay, I explained that what ever facility they decided to accept the bed offer with would run the insurance and be able to tell them specifically what the cost, if any, would be,  She stated that she was told by [facility] that it would be over 800$ out of pocket and she does not have the money, I encouraged her to call [facility] and speak to them I explained that once the patient is medically stable and ready for Discharge then we would not be able to continue to hold her in the hospital.  She stated understanding, I explained that I needed to get her choice of facility so that I can start the insurance prior auth process, she stated that she would call [facility] and call me back       Expected Discharge Plan and Services                                                 Social Determinants of Health (SDOH) Interventions    Readmission Risk Interventions Readmission Risk Prevention Plan 08/21/2020  Transportation Screening Complete  PCP or Specialist Appt within 5-7 Days Complete  Home Care Screening Complete  Medication Review (RN CM) Complete  Some recent data might be hidden

## 2020-11-28 NOTE — Progress Notes (Signed)
PROGRESS NOTE    Andrea [name]  UJW:[id number] DOB: 07-02-1945 DOA: 11/17/2020 PCP: [name], FNP   Brief Narrative:  This 75 y.o. female with PMH significant for insulin-dependent diabetes mellitus, hypertension, hyperlipidemia, GERD, history of meningitis, CAD, COPD, history of TIA, grade 1 diastolic dysfunction, depression, anxiety, osteoarthritis presented from home to the ED on 11/17/2020 with wound of left heel x 3 months. In the ED, she was found to have osteomyelitis. She was  treated with IV antibiotics.  Podiatry was consulted.  Patient underwent debridement and partial calicectomy on 5/62.  Infectious disease consulted recommended IV daptomycin and cefepime for 6 weeks.  Patient is a stable and awaiting discharge to SNF.  Wound VAC been removed.  Assessment & Plan:   Principal Problem:   Osteomyelitis ([place]) Active Problems:   Chronic obstructive pulmonary disease (HCC)   HLD (hyperlipidemia)   Coronary artery disease involving native coronary artery   PAD (peripheral artery disease) (HCC)   History of meningitis   Status post total hip replacement, left   Cellulitis of left lower extremity   Status post peripherally inserted central catheter (PICC) central line placement   Left heel osteomyelitis: Podiatry consulted.  Patient status post debridement and partial colcectomy 9/15. Continue IV daptomycin and cefepime for 6 weeks total, per ID Consider weekly CK monitoring on daptomycin. Patient stable for discharge to SNF once authorization approved. Follow-up with Dr. Amalia Hailey (podiatry) in 2 weeks. Adequate pain control with oxycodone as needed. Bowel regimen has been discontinued due to diarrhea with antibiotic use. Imodium PRN.    AKI : Could be secondary to the medications,  which has now improved.  Normocytic normochromic anemia:  which has been improved. S/p 1 unit PRBC,  hemoglobin 10.0.   No signs of any obvious bleeding.   Hypertension: >  Stable. Continue metoprolol 25 mg daily.  Hyperlipidemia: Continue Lipitor.  Type 2 diabetes: Hemoglobin A1c 7.5 on admission Decrease long-acting insulin to 20 units. Continue sliding scale.   Overactive bladder: Continue oxybutynin.   DVT prophylaxis: Lovenox Code Status: Full code Family Communication: No family at bedside Disposition Plan:  Status is: Inpatient  Remains inpatient appropriate because:Inpatient level of care appropriate due to severity of illness  Dispo: The patient is from: Home              Anticipated d/c is to: SNF              Patient currently is not medically stable to d/c.   Difficult to place patient No   Consultants:  Podiatry  Procedures: Left partial calcanectomy Antimicrobials:   Anti-infectives (From admission, onward)    Start     Dose/Rate Route Frequency Ordered Stop   11/24/20 1600  DAPTOmycin (CUBICIN) 400 mg in sodium chloride 0.9 % IVPB        6 mg/kg  62.6 kg 116 mL/hr over 30 Minutes Intravenous Daily 11/24/20 1411     11/21/20 1400  ceFEPIme (MAXIPIME) 2 g in sodium chloride 0.9 % 100 mL IVPB        2 g 200 mL/hr over 30 Minutes Intravenous Every 12 hours 11/21/20 0730     11/21/20 0141  ceFEPIme (MAXIPIME) 2 g in sodium chloride 0.9 % 100 mL IVPB  Status:  Discontinued        2 g 200 mL/hr over 30 Minutes Intravenous Every 24 hours 11/20/20 0923 11/21/20 0730   11/20/20 1800  linezolid (ZYVOX) IVPB 600 mg  Status:  Discontinued  600 mg 300 mL/hr over 60 Minutes Intravenous Every 12 hours 11/20/20 0915 11/24/20 1317   11/20/20 1702  vancomycin (VANCOCIN) powder  Status:  Discontinued          As needed 11/20/20 1702 11/20/20 1743   11/20/20 1700  tobramycin (NEBCIN) powder  Status:  Discontinued          As needed 11/20/20 1701 11/20/20 1743   11/19/20 2200  vancomycin (VANCOCIN) IVPB 1000 mg/200 mL premix  Status:  Discontinued        1,000 mg 200 mL/hr over 60 Minutes Intravenous Every 24 hours 11/19/20 0934  11/20/20 0915   11/19/20 0100  vancomycin (VANCOREADY) IVPB 750 mg/150 mL  Status:  Discontinued        750 mg 150 mL/hr over 60 Minutes Intravenous Every 24 hours 11/18/20 0028 11/19/20 0934   11/18/20 0200  ceFEPIme (MAXIPIME) 2 g in sodium chloride 0.9 % 100 mL IVPB  Status:  Discontinued        2 g 200 mL/hr over 30 Minutes Intravenous Every 12 hours 11/17/20 2347 11/20/20 0923   11/18/20 0100  vancomycin (VANCOREADY) IVPB 1500 mg/300 mL        1,500 mg 150 mL/hr over 120 Minutes Intravenous  Once 11/17/20 2347 11/18/20 0238       Subjective: Patient was seen and examined at bedside.  Overnight events noted.   She reports feeling better,  diarrhea has improved.  She still reports having pain but is reasonably controlled with pain medication Patient has a PICC line for long-term IV antibiotics.  Objective: Vitals:   11/27/20 1927 11/27/20 2341 11/28/20 0416 11/28/20 0950  BP: 140/63 (!) 139/55 (!) 125/55 (!) 114/44  Pulse: 98 (!) 102 90 69  Resp: 16 16 16 16   Temp: 98.1 F (36.7 C) 97.9 F (36.6 C) 97.9 F (36.6 C) 98.4 F (36.9 C)  TempSrc: Oral Oral Oral Oral  SpO2: 99% 98% 98% 97%  Weight:      Height:        Intake/Output Summary (Last 24 hours) at 11/28/2020 1344 Last data filed at 11/28/2020 1009 Gross per 24 hour  Intake 638 ml  Output 600 ml  Net 38 ml   Filed Weights   11/17/20 1744 11/20/20 1538  Weight: 62.6 kg 62.6 kg    Examination:  General exam: Appears comfortable, not in any acute distress. Respiratory system: Clear to auscultation bilaterally, no wheezing, no crackles. Cardiovascular system: S1-S2 heard, regular rate and rhythm, no murmur. Gastrointestinal system: Abdomen is soft, nontender, nondistended, BS+ Central nervous system: Alert and oriented X3. No focal neurological deficits. Extremities: Left foot covered in dressing, no soakage. Skin: No rashes, lesions or ulcers Psychiatry: Judgement and insight appear normal. Mood & affect  appropriate.     Data Reviewed: I have personally reviewed following labs and imaging studies  CBC: Recent Labs  Lab 11/24/20 0403 11/25/20 0504 11/26/20 0610 11/27/20 0435 11/28/20 0647  WBC 7.6 3.0* 5.4 6.8 6.1  HGB 8.2* 8.3* 7.9* 10.0* 10.3*  HCT 26.7* 23.5* 24.5* 30.1* 31.2*  MCV 90.2 93.6 90.7 88.8 87.2  PLT 198 139* 196 181 983   Basic Metabolic Panel: Recent Labs  Lab 11/24/20 0403 11/25/20 0504 11/26/20 0610 11/27/20 0435 11/28/20 0647  NA 135 135 138 139 137  K 5.0 4.1 4.3 4.6 4.4  CL 113* 100 111 111 105  CO2 21* 29 25 24 26   GLUCOSE 99 103* 57* 90 84  BUN 25* 16 21 19  15  CREATININE 1.06* 0.91 0.92 0.91 0.78  CALCIUM 9.1 8.8* 9.1 9.2 8.9   GFR: Estimated Creatinine Clearance: 50.2 mL/min (by C-G formula based on SCr of 0.78 mg/dL). Liver Function Tests: No results for input(s): AST, ALT, ALKPHOS, BILITOT, PROT, ALBUMIN in the last 168 hours. No results for input(s): LIPASE, AMYLASE in the last 168 hours. No results for input(s): AMMONIA in the last 168 hours. Coagulation Profile: No results for input(s): INR, PROTIME in the last 168 hours. Cardiac Enzymes: Recent Labs  Lab 11/24/20 0403  CKTOTAL 20*   BNP (last 3 results) No results for input(s): PROBNP in the last 8760 hours. HbA1C: No results for input(s): HGBA1C in the last 72 hours. CBG: Recent Labs  Lab 11/27/20 1146 11/27/20 1741 11/27/20 2051 11/28/20 0808 11/28/20 1221  GLUCAP 132* 228* 184* 88 148*   Lipid Profile: No results for input(s): CHOL, HDL, LDLCALC, TRIG, CHOLHDL, LDLDIRECT in the last 72 hours. Thyroid Function Tests: No results for input(s): TSH, T4TOTAL, FREET4, T3FREE, THYROIDAB in the last 72 hours. Anemia Panel: No results for input(s): VITAMINB12, FOLATE, FERRITIN, TIBC, IRON, RETICCTPCT in the last 72 hours. Sepsis Labs: No results for input(s): PROCALCITON, LATICACIDVEN in the last 168 hours.  Recent Results (from the past 240 hour(s))  Surgical pcr  screen     Status: None   Collection Time: 11/19/20 12:41 PM   Specimen: Nasal Mucosa; Nasal Swab  Result Value Ref Range Status   MRSA, PCR NEGATIVE NEGATIVE Final   Staphylococcus aureus NEGATIVE NEGATIVE Final    Comment: (NOTE) The Xpert SA Assay (FDA approved for NASAL specimens in patients 21 years of age and older), is one component of a comprehensive surveillance program. It is not intended to diagnose infection nor to guide or monitor treatment. Performed at [facility], [place], [place] [address]   Aerobic/Anaerobic Culture w Gram Stain (surgical/deep wound)     Status: None   Collection Time: 11/20/20  5:08 PM   Specimen: PATH Bone biopsy; Tissue  Result Value Ref Range Status   Specimen Description   Final    HEEL Performed at [facility], [address] [place], [place] [address]    Special Requests   Final    LEFT HEEL BONE CULTURE Performed at [facility], [place], [place] [address]    Gram Stain NO WBC SEEN RARE GRAM NEGATIVE RODS   Final   Culture   Final    RARE PSEUDOMONAS AERUGINOSA NO ANAEROBES ISOLATED Performed at [facility], [place] [address] [place], [place] [address]    Report Status 11/25/2020 FINAL  Final   Organism ID, Bacteria PSEUDOMONAS AERUGINOSA  Final      Susceptibility   Pseudomonas aeruginosa - MIC*    CEFTAZIDIME 4 SENSITIVE Sensitive     CIPROFLOXACIN <=0.25 SENSITIVE Sensitive     GENTAMICIN <=1 SENSITIVE Sensitive     IMIPENEM 2 SENSITIVE Sensitive     PIP/TAZO 16 SENSITIVE Sensitive     CEFEPIME 4 SENSITIVE Sensitive     * RARE PSEUDOMONAS AERUGINOSA  Aerobic/Anaerobic Culture w Gram Stain (surgical/deep wound)     Status: None   Collection Time: 11/20/20  5:08 PM   Specimen: PATH Bone biopsy; Tissue  Result Value Ref Range Status   Specimen Description   Final    WOUND Performed at [facility], [address], [place], [place] [address]     Special Requests   Final  LEFT HEEL WD CULTURE Performed at [facility], [place], [place] [address]    Gram Stain NO WBC SEEN NO ORGANISMS SEEN   Final   Culture   Final    No growth aerobically or anaerobically. Performed at [facility], [place] [address] [place], [place] [address]    Report Status 11/25/2020 FINAL  Final     Radiology Studies: No results found.  Scheduled Meds:  acetaminophen  1,000 mg Oral TID   atorvastatin  20 mg Oral Daily   Chlorhexidine Gluconate Cloth  6 each Topical Daily   enoxaparin (LOVENOX) injection  40 mg Subcutaneous Q24H   insulin aspart  0-6 Units Subcutaneous TID WC   insulin glargine-yfgn  20 Units Subcutaneous Daily   metoprolol succinate  25 mg Oral Daily   oxybutynin  10 mg Oral QHS   pantoprazole  80 mg Oral QAC breakfast   sodium chloride flush  10-40 mL Intracatheter Q12H   vitamin B-12  1,000 mcg Oral Daily   Continuous Infusions:  sodium chloride 1,000 mL (11/21/20 2136)   ceFEPime (MAXIPIME) IV 2 g (11/28/20 1029)   DAPTOmycin (CUBICIN)  IV Stopped (11/27/20 2028)     LOS: 10 days    Time spent: 35 mins    Karrina Lye, MD [facility]   If 7PM-7AM, please contact night-coverage

## 2020-11-28 NOTE — Progress Notes (Signed)
Occupational Therapy Treatment Patient Details Name: Andrea [name] MRN: [id number] DOB: [birth year] Today's Date: 11/28/2020   History of present illness 75 y.o. female with medical history significant for insulin-dependent diabetes mellitus, hypertension, hyperlipidemia, GERD, history of meningitis, CAD, COPD, history of TIA, grade 1 diastolic dysfunction, depression, anxiety, osteoarthritis, who presents to the emergency department from home on 11/17/2020 at the request of her PCP for chief concerns of osteomyelitis of the left heel.  L hip surgery on 08/19/2020. Underwent debridement of heel ulcer with partial calcanectomy and placement of antibiotic beads with application of wound vac left on 11/20/2020. NWB L LE.   OT comments  Pt seen for OT Tx this date to f/u re: safety with ADLs/ADL mobility. OT engages pt in SPS transfers with MIN A and STS with CGA with RW and cues for NWB. OT engages pt in family member in education re: practical stretching to improve L hip ROM as well as practical strengthening to improve tolerance for standing and mobility with RW (strengthening B UE to compensate for NWB to L LE). Pt tolerates well. OT engages pt in modified tricep push ups for 2 sets x10 reps with cues for form and pace. OT engages pt in trials for modified LB dressing and pt still requiring MOD A. Ed re: use of grabber for LB dressing and pt with good understanding. Will continue to follow acutely. Continue to anticipate that pt will require STR f/u OT services, but pt demos good effort and progress with acute OT goals.    Recommendations for follow up therapy are one component of a multi-disciplinary discharge planning process, led by the attending physician.  Recommendations may be updated based on patient status, additional functional criteria and insurance authorization.    Follow Up Recommendations  SNF    Equipment Recommendations  Other (comment) (defer)    Recommendations for Other Services       Precautions / Restrictions Precautions Precautions: Fall Restrictions Weight Bearing Restrictions: Yes LLE Weight Bearing: Non weight bearing       Mobility Bed Mobility               General bed mobility comments: up to chair pre/post session    Transfers Overall transfer level: Needs assistance Equipment used: Rolling walker (2 wheeled) Transfers: Sit to/from [employer] Transfers Sit to Stand: Min guard Stand pivot transfers: Min assist       General transfer comment: increased time for SPS and increased cues for NWB to L LE. Pt demos increased success with pivoting to her R side, but overall demos improved tolerance. 2 SPS trials completed and 3 STS, cues for use of tricep strength to push to standing to reduce reliance on hacing bilateral lower extremity use.    Balance Overall balance assessment: Needs assistance Sitting-balance support: No upper extremity supported;Feet supported Sitting balance-Leahy Scale: Good     Standing balance support: Bilateral upper extremity supported Standing balance-Leahy Scale: Fair                             ADL either performed or assessed with clinical judgement   ADL Overall ADL's : Needs assistance/impaired             Lower Body Bathing: Moderate assistance;Sitting/lateral leans Lower Body Bathing Details (indicate cue type and reason): to don sock half way to L foot to apply some tread (non-skid). Pt good at NWB in standing, but does occasionally touch toe  down while in seated rest and doesn't want it to touch hospital floor).                             Vision       Perception     Praxis      Cognition Arousal/Alertness: Awake/alert Behavior During Therapy: WFL for tasks assessed/performed Overall Cognitive Status: Within Functional Limits for tasks assessed                                 General Comments: Pleasant, conversational t/o session.         Exercises Other Exercises Other Exercises: OT engages pt in 2 sets x5 reps chair push ups with L LE kicked out to improve tricep strength to help compensate for NWB to L LE, while using RW/AD for functional transfers. Pt tolerates well. Visual/verbal cues for form and pace.   Shoulder Instructions       General Comments      Pertinent Vitals/ Pain       Pain Assessment: Faces Faces Pain Scale: Hurts little more Pain Location: L foot Pain Descriptors / Indicators: Discomfort;Aching;Throbbing Pain Intervention(s): Limited activity within patient's tolerance;Monitored during session  Home Living                                          Prior Functioning/Environment              Frequency  Min 1X/week        Progress Toward Goals  OT Goals(current goals can now be found in the care plan section)  Progress towards OT goals: Progressing toward goals  Acute Rehab OT Goals Patient Stated Goal: decreased pain OT Goal Formulation: With patient Time For Goal Achievement: 12/06/20 Potential to Achieve Goals: Good  Plan Discharge plan remains appropriate;Frequency remains appropriate    Co-evaluation                 AM-PAC OT "6 Clicks" Daily Activity     Outcome Measure   Help from another person eating meals?: None Help from another person taking care of personal grooming?: A Little Help from another person toileting, which includes using toliet, bedpan, or urinal?: A Lot Help from another person bathing (including washing, rinsing, drying)?: A Lot Help from another person to put on and taking off regular upper body clothing?: A Little Help from another person to put on and taking off regular lower body clothing?: A Lot 6 Click Score: 16    End of Session Equipment Utilized During Treatment: Gait belt;Rolling walker  OT Visit Diagnosis: Unsteadiness on feet (R26.81);Muscle weakness (generalized) (M62.81);Pain Pain - Right/Left: Left Pain  - part of body: Leg   Activity Tolerance Patient tolerated treatment well   Patient Left in chair;with call bell/phone within reach   Nurse Communication Mobility status        Time: 6063-0160 OT Time Calculation (min): 61 min  Charges: OT General Charges $OT Visit: 1 Visit OT Treatments $Self Care/Home Management : 23-37 mins $Therapeutic Activity: 8-22 mins $Therapeutic Exercise: 8-22 mins  Gerrianne Scale, [place], OTR/L ascom [phone] 11/28/20, 5:58 PM

## 2020-11-28 NOTE — Plan of Care (Signed)
  Problem: Health Behavior/Discharge Planning: Goal: Ability to manage health-related needs will improve Outcome: Progressing   Problem: Clinical Measurements: Goal: Will remain free from infection Outcome: Progressing   Problem: Activity: Goal: Risk for activity intolerance will decrease Outcome: Progressing   

## 2020-11-28 NOTE — TOC Progression Note (Signed)
Transition of Care [facility]) - Progression Note    Patient Details  Name: Andrea [name] MRN: [id number] Date of Birth: 12-25-1945  Transition of Care [facility]) CM/SW [place], RN Phone Number: 11/28/2020, 3:29 PM  Clinical Narrative:     Met with the patient and her daughter, they chose [facility] and acecpted the bed, I notified Tina at [facility], Tammy will be doing the weekend admission, clinical notes submitted to [facility] health       Expected Discharge Plan and Services                                                 Social Determinants of Health (SDOH) Interventions    Readmission Risk Interventions Readmission Risk Prevention Plan 08/21/2020  Transportation Screening Complete  PCP or Specialist Appt within 5-7 Days Complete  Home Care Screening Complete  Medication Review (RN CM) Complete  Some recent data might be hidden

## 2020-11-28 NOTE — TOC Progression Note (Signed)
Transition of Care [facility]) - Progression Note    Patient Details  Name: Andrea [name] MRN: [id number] Date of Birth: 1945-08-06  Transition of Care [facility]) CM/SW Contact  Su Hilt, RN Phone Number: 11/28/2020, 3:51 PM  Clinical Narrative:    Submitted clinical Noted to request ins Josem Kaufmann ref number [id number]        Expected Discharge Plan and Services                                                 Social Determinants of Health ([place]) Interventions    Readmission Risk Interventions Readmission Risk Prevention Plan 08/21/2020  Transportation Screening Complete  PCP or Specialist Appt within 5-7 Days Complete  Home Care Screening Complete  Medication Review (RN CM) Complete  Some recent data might be hidden

## 2020-11-29 LAB — CBC
HCT: 30.3 % — ABNORMAL LOW (ref 36.0–46.0)
Hemoglobin: 10 g/dL — ABNORMAL LOW (ref 12.0–15.0)
MCH: 29.8 pg (ref 26.0–34.0)
MCHC: 33 g/dL (ref 30.0–36.0)
MCV: 90.2 fL (ref 80.0–100.0)
Platelets: 156 10*3/uL (ref 150–400)
RBC: 3.36 MIL/uL — ABNORMAL LOW (ref 3.87–5.11)
RDW: 16.4 % — ABNORMAL HIGH (ref 11.5–15.5)
WBC: 6.7 10*3/uL (ref 4.0–10.5)
nRBC: 0 % (ref 0.0–0.2)

## 2020-11-29 LAB — GLUCOSE, CAPILLARY
Glucose-Capillary: 169 mg/dL — ABNORMAL HIGH (ref 70–99)
Glucose-Capillary: 167 mg/dL — ABNORMAL HIGH (ref 70–99)
Glucose-Capillary: 208 mg/dL — ABNORMAL HIGH (ref 70–99)

## 2020-11-29 LAB — BASIC METABOLIC PANEL
Anion gap: 6 (ref 5–15)
BUN: 19 mg/dL (ref 8–23)
CO2: 26 mmol/L (ref 22–32)
Calcium: 8.8 mg/dL — ABNORMAL LOW (ref 8.9–10.3)
Chloride: 105 mmol/L (ref 98–111)
Creatinine, Ser: 1.02 mg/dL — ABNORMAL HIGH (ref 0.44–1.00)
GFR, Estimated: 57 mL/min — ABNORMAL LOW (ref >60)
Glucose, Bld: 142 mg/dL — ABNORMAL HIGH (ref 70–99)
Potassium: 4.5 mmol/L (ref 3.5–5.1)
Sodium: 137 mmol/L (ref 135–145)

## 2020-11-29 NOTE — Progress Notes (Signed)
PROGRESS NOTE    Andrea [name]  YYT:[id number] DOB: [birth year] DOA: 11/17/2020 PCP: [name], FNP   Brief Narrative:  This 75 y.o. female with PMH significant for insulin-dependent diabetes mellitus, hypertension, hyperlipidemia, GERD, history of meningitis, CAD, COPD, history of TIA, grade 1 diastolic dysfunction, depression, anxiety, osteoarthritis presented from home to the ED on 11/17/2020 with wound of left heel x 3 months. In the ED, she was found to have osteomyelitis. She was  treated with IV antibiotics.  Podiatry was consulted.  Patient underwent debridement and partial calicectomy on 2/75.  Infectious disease consulted recommended IV daptomycin and cefepime for 6 weeks.  Patient is a stable and awaiting discharge to SNF.  Wound VAC been removed.  Assessment & Plan:   Principal Problem:   Osteomyelitis ([place]) Active Problems:   Chronic obstructive pulmonary disease (HCC)   HLD (hyperlipidemia)   Coronary artery disease involving native coronary artery   PAD (peripheral artery disease) (HCC)   History of meningitis   Status post total hip replacement, left   Cellulitis of left lower extremity   Status post peripherally inserted central catheter (PICC) central line placement   Left heel osteomyelitis: status post debridement and partial colcectomy 9/15. Plan: Continue IV daptomycin and cefepime for 6 weeks total, per ID --weekly CK monitoring on daptomycin. --Follow-up with Dr. Amalia Hailey (podiatry) in 2 weeks.   AKI : Could be secondary to the medications  Normocytic normochromic anemia:  S/p 1 unit PRBC,  hemoglobin 10.0.   No signs of any obvious bleeding.   Hypertension: > Stable. Continue metoprolol 25 mg daily.  Hyperlipidemia: Continue Lipitor.  Type 2 diabetes: Hemoglobin A1c 7.5 on admission --cont long-acting insulin to 20 units. Continue sliding scale.   Overactive bladder: Continue oxybutynin.   DVT prophylaxis: Lovenox Code Status: Full  code Family Communication: Disposition Plan:  Status is: Inpatient  Remains inpatient appropriate because:Inpatient level of care appropriate due to severity of illness  Dispo: The patient is from: Home              Anticipated d/c is to: SNF              Patient currently is medically stable to d/c.  Waiting on insurrance auth    Difficult to place patient No   Consultants:  Podiatry  Procedures: Left partial calcanectomy Antimicrobials:   Anti-infectives (From admission, onward)    Start     Dose/Rate Route Frequency Ordered Stop   11/24/20 1600  DAPTOmycin (CUBICIN) 400 mg in sodium chloride 0.9 % IVPB        6 mg/kg  62.6 kg 116 mL/hr over 30 Minutes Intravenous Daily 11/24/20 1411     11/21/20 1400  ceFEPIme (MAXIPIME) 2 g in sodium chloride 0.9 % 100 mL IVPB        2 g 200 mL/hr over 30 Minutes Intravenous Every 12 hours 11/21/20 0730     11/21/20 0141  ceFEPIme (MAXIPIME) 2 g in sodium chloride 0.9 % 100 mL IVPB  Status:  Discontinued        2 g 200 mL/hr over 30 Minutes Intravenous Every 24 hours 11/20/20 0923 11/21/20 0730   11/20/20 1800  linezolid (ZYVOX) IVPB 600 mg  Status:  Discontinued        600 mg 300 mL/hr over 60 Minutes Intravenous Every 12 hours 11/20/20 0915 11/24/20 1317   11/20/20 1702  vancomycin (VANCOCIN) powder  Status:  Discontinued  As needed 11/20/20 1702 11/20/20 1743   11/20/20 1700  tobramycin (NEBCIN) powder  Status:  Discontinued          As needed 11/20/20 1701 11/20/20 1743   11/19/20 2200  vancomycin (VANCOCIN) IVPB 1000 mg/200 mL premix  Status:  Discontinued        1,000 mg 200 mL/hr over 60 Minutes Intravenous Every 24 hours 11/19/20 0934 11/20/20 0915   11/19/20 0100  vancomycin (VANCOREADY) IVPB 750 mg/150 mL  Status:  Discontinued        750 mg 150 mL/hr over 60 Minutes Intravenous Every 24 hours 11/18/20 0028 11/19/20 0934   11/18/20 0200  ceFEPIme (MAXIPIME) 2 g in sodium chloride 0.9 % 100 mL IVPB  Status:   Discontinued        2 g 200 mL/hr over 30 Minutes Intravenous Every 12 hours 11/17/20 2347 11/20/20 0923   11/18/20 0100  vancomycin (VANCOREADY) IVPB 1500 mg/300 mL        1,500 mg 150 mL/hr over 120 Minutes Intravenous  Once 11/17/20 2347 11/18/20 0238       Subjective: Normal oral intake, diarrhea improved.   Objective: Vitals:   11/28/20 2352 11/29/20 0457 11/29/20 0748 11/29/20 1552  BP: (!) 115/57 133/78 (!) 129/54 (!) 120/42  Pulse: 85 78 95 79  Resp: 18 18 16 15   Temp: 97.9 F (36.6 C) 97.7 F (36.5 C) 98 F (36.7 C) 97.9 F (36.6 C)  TempSrc: Oral Oral Oral Oral  SpO2: 99% 100% 98% 98%  Weight:      Height:        Intake/Output Summary (Last 24 hours) at 11/29/2020 1612 Last data filed at 11/29/2020 1312 Gross per 24 hour  Intake 360 ml  Output 500 ml  Net -140 ml   Filed Weights   11/17/20 1744 11/20/20 1538  Weight: 62.6 kg 62.6 kg    Examination:  Constitutional: NAD, AAOx3 HEENT: conjunctivae and lids normal, EOMI CV: No cyanosis.   RESP: normal respiratory effort, on RA Extremities: left foot wrapped SKIN: warm, dry Neuro: II - XII grossly intact.   Psych: Normal mood and affect.  Appropriate judgement and reason   Data Reviewed: I have personally reviewed following labs and imaging studies  CBC: Recent Labs  Lab 11/25/20 0504 11/26/20 0610 11/27/20 0435 11/28/20 0647 11/29/20 0627  WBC 3.0* 5.4 6.8 6.1 6.7  HGB 8.3* 7.9* 10.0* 10.3* 10.0*  HCT 23.5* 24.5* 30.1* 31.2* 30.3*  MCV 93.6 90.7 88.8 87.2 90.2  PLT 139* 196 181 183 333   Basic Metabolic Panel: Recent Labs  Lab 11/25/20 0504 11/26/20 0610 11/27/20 0435 11/28/20 0647 11/29/20 0627  NA 135 138 139 137 137  K 4.1 4.3 4.6 4.4 4.5  CL 100 111 111 105 105  CO2 29 25 24 26 26   GLUCOSE 103* 57* 90 84 142*  BUN 16 21 19 15 19   CREATININE 0.91 0.92 0.91 0.78 1.02*  CALCIUM 8.8* 9.1 9.2 8.9 8.8*   GFR: Estimated Creatinine Clearance: 39.3 mL/min (A) (by C-G formula  based on SCr of 1.02 mg/dL (H)). Liver Function Tests: No results for input(s): AST, ALT, ALKPHOS, BILITOT, PROT, ALBUMIN in the last 168 hours. No results for input(s): LIPASE, AMYLASE in the last 168 hours. No results for input(s): AMMONIA in the last 168 hours. Coagulation Profile: No results for input(s): INR, PROTIME in the last 168 hours. Cardiac Enzymes: Recent Labs  Lab 11/24/20 0403  CKTOTAL 20*   BNP (last 3 results)  No results for input(s): PROBNP in the last 8760 hours. HbA1C: No results for input(s): HGBA1C in the last 72 hours. CBG: Recent Labs  Lab 11/28/20 0808 11/28/20 1221 11/28/20 1744 11/28/20 2100 11/29/20 1122  GLUCAP 88 148* 183* 167* 169*   Lipid Profile: No results for input(s): CHOL, HDL, LDLCALC, TRIG, CHOLHDL, LDLDIRECT in the last 72 hours. Thyroid Function Tests: No results for input(s): TSH, T4TOTAL, FREET4, T3FREE, THYROIDAB in the last 72 hours. Anemia Panel: No results for input(s): VITAMINB12, FOLATE, FERRITIN, TIBC, IRON, RETICCTPCT in the last 72 hours. Sepsis Labs: No results for input(s): PROCALCITON, LATICACIDVEN in the last 168 hours.  Recent Results (from the past 240 hour(s))  Aerobic/Anaerobic Culture w Gram Stain (surgical/deep wound)     Status: None   Collection Time: 11/20/20  5:08 PM   Specimen: PATH Bone biopsy; Tissue  Result Value Ref Range Status   Specimen Description   Final    HEEL Performed at [facility], [address] [place], [place] [address]    Special Requests   Final    LEFT HEEL BONE CULTURE Performed at [facility], [place], [place] [address]    Gram Stain NO WBC SEEN RARE GRAM NEGATIVE RODS   Final   Culture   Final    RARE PSEUDOMONAS AERUGINOSA NO ANAEROBES ISOLATED Performed at [facility], [place] [address] [place], [place] [address]    Report Status 11/25/2020 FINAL  Final   Organism ID, Bacteria PSEUDOMONAS AERUGINOSA  Final       Susceptibility   Pseudomonas aeruginosa - MIC*    CEFTAZIDIME 4 SENSITIVE Sensitive     CIPROFLOXACIN <=0.25 SENSITIVE Sensitive     GENTAMICIN <=1 SENSITIVE Sensitive     IMIPENEM 2 SENSITIVE Sensitive     PIP/TAZO 16 SENSITIVE Sensitive     CEFEPIME 4 SENSITIVE Sensitive     * RARE PSEUDOMONAS AERUGINOSA  Aerobic/Anaerobic Culture w Gram Stain (surgical/deep wound)     Status: None   Collection Time: 11/20/20  5:08 PM   Specimen: PATH Bone biopsy; Tissue  Result Value Ref Range Status   Specimen Description   Final    WOUND Performed at [facility], [address], [place], [place] [address]    Special Requests   Final    LEFT HEEL WD CULTURE Performed at [facility], [place], [place] [address]    Gram Stain NO WBC SEEN NO ORGANISMS SEEN   Final   Culture   Final    No growth aerobically or anaerobically. Performed at [facility], [place] [address], [place], [place] [address]    Report Status 11/25/2020 FINAL  Final     Radiology Studies: No results found.  Scheduled Meds:  acetaminophen  1,000 mg Oral TID   atorvastatin  20 mg Oral Daily   Chlorhexidine Gluconate Cloth  6 each Topical Daily   enoxaparin (LOVENOX) injection  40 mg Subcutaneous Q24H   insulin aspart  0-6 Units Subcutaneous TID WC   insulin glargine-yfgn  20 Units Subcutaneous Daily   metoprolol succinate  25 mg Oral Daily   oxybutynin  10 mg Oral QHS   pantoprazole  80 mg Oral QAC breakfast   sodium chloride flush  10-40 mL Intracatheter Q12H   vitamin B-12  1,000 mcg Oral Daily   Continuous Infusions:  sodium chloride 250 mL (11/28/20 2151)   ceFEPime (MAXIPIME) IV 2 g (11/29/20 0809)   DAPTOmycin (  CUBICIN)  IV 400 mg (11/28/20 2156)     LOS: 11 days     Enzo Bi, MD [facility]   If 7PM-7AM, please contact night-coverage

## 2020-11-29 NOTE — TOC Progression Note (Signed)
Transition of Care [facility]) - Progression Note    Patient Details  Name: NIMRIT KEHRES MRN: [id number] Date of Birth: [birth year]  Transition of Care [facility]) CM/SW Contact  Zigmund Daniel Dorian Pod, RN Phone Number:[phone] 11/29/2020, 4:07 PM  Clinical Narrative:    RN attempted outreach to [facility] with Tammy for update on authorization for placement. Attending updated and RN attempted to call pt with update unsuccessful however spoke with the daughter Caryl Asp with on pending [facility] to call with authorization for pt to be placed. Will continue to follow up accordingly with progress on tomorrow if no contact to [place] today.  TOC RN will remain available to assist with ongoing needs prior to discharge.        Expected Discharge Plan and Services                                                 Social Determinants of Health (SDOH) Interventions    Readmission Risk Interventions Readmission Risk Prevention Plan 08/21/2020  Transportation Screening Complete  PCP or Specialist Appt within 5-7 Days Complete  Home Care Screening Complete  Medication Review (RN CM) Complete  Some recent data might be hidden

## 2020-11-30 LAB — BASIC METABOLIC PANEL
Anion gap: 7 (ref 5–15)
BUN: 19 mg/dL (ref 8–23)
CO2: 24 mmol/L (ref 22–32)
Calcium: 8.9 mg/dL (ref 8.9–10.3)
Chloride: 107 mmol/L (ref 98–111)
Creatinine, Ser: 0.96 mg/dL (ref 0.44–1.00)
GFR, Estimated: >60 mL/min (ref >60)
Glucose, Bld: 99 mg/dL (ref 70–99)
Potassium: 4.6 mmol/L (ref 3.5–5.1)
Sodium: 138 mmol/L (ref 135–145)

## 2020-11-30 LAB — CBC
HCT: 31.3 % — ABNORMAL LOW (ref 36.0–46.0)
Hemoglobin: 10.1 g/dL — ABNORMAL LOW (ref 12.0–15.0)
MCH: 28.5 pg (ref 26.0–34.0)
MCHC: 32.3 g/dL (ref 30.0–36.0)
MCV: 88.4 fL (ref 80.0–100.0)
Platelets: 171 10*3/uL (ref 150–400)
RBC: 3.54 MIL/uL — ABNORMAL LOW (ref 3.87–5.11)
RDW: 16.7 % — ABNORMAL HIGH (ref 11.5–15.5)
WBC: 8.6 10*3/uL (ref 4.0–10.5)
nRBC: 0.3 % — ABNORMAL HIGH (ref 0.0–0.2)

## 2020-11-30 LAB — GLUCOSE, CAPILLARY
Glucose-Capillary: 112 mg/dL — ABNORMAL HIGH (ref 70–99)
Glucose-Capillary: 163 mg/dL — ABNORMAL HIGH (ref 70–99)
Glucose-Capillary: 156 mg/dL — ABNORMAL HIGH (ref 70–99)
Glucose-Capillary: 217 mg/dL — ABNORMAL HIGH (ref 70–99)

## 2020-11-30 LAB — MAGNESIUM: Magnesium: 1.6 mg/dL — ABNORMAL LOW (ref 1.7–2.4)

## 2020-11-30 MED ORDER — SERTRALINE HCL 50 MG PO TABS
25.0000 mg | ORAL_TABLET | Freq: Every day | ORAL | Status: DC
  Administered 2020-11-30: 25 mg via ORAL
  Filled 2020-11-30: qty 1

## 2020-11-30 MED ORDER — INFLUENZA VAC A&B SA ADJ QUAD 0.5 ML IM PRSY
0.5000 mL | PREFILLED_SYRINGE | INTRAMUSCULAR | Status: AC
  Administered 2020-12-01: 0.5 mL via INTRAMUSCULAR
  Filled 2020-11-30 (×2): qty 0.5

## 2020-11-30 MED ORDER — MAGNESIUM SULFATE 4 GM/100ML IV SOLN
4.0000 g | Freq: Once | INTRAVENOUS | Status: AC
  Administered 2020-11-30: 4 g via INTRAVENOUS
  Filled 2020-11-30: qty 100

## 2020-11-30 NOTE — TOC Progression Note (Addendum)
Transition of Care [facility]) - Progression Note    Patient Details  Name: ORALEE RAPAPORT MRN: [id number] Date of Birth: [birth year]  Transition of Care [facility]) CM/SW [place], RN Phone Number: 11/30/2020, 8:31 AM  Clinical Narrative:    Insurance approved [id number], next review date 9/27, left a Message with Both Tammy and Otila Kluver at [facility], Spoke with the [name] at [phone], She stated that they have worked out everything financially and the patient can go when [facility] can accept her. Her daughter will transport her.         Expected Discharge Plan and Services                                                 Social Determinants of Health (SDOH) Interventions    Readmission Risk Interventions Readmission Risk Prevention Plan 08/21/2020  Transportation Screening Complete  PCP or Specialist Appt within 5-7 Days Complete  Home Care Screening Complete  Medication Review (RN CM) Complete  Some recent data might be hidden

## 2020-11-30 NOTE — Progress Notes (Signed)
PROGRESS NOTE    Andrea [name]  BTY:[id number] DOB: October 22, 1945 DOA: 11/17/2020 PCP: [name], FNP   Brief Narrative:  This 75 y.o. female with PMH significant for insulin-dependent diabetes mellitus, hypertension, hyperlipidemia, GERD, history of meningitis, CAD, COPD, history of TIA, grade 1 diastolic dysfunction, depression, anxiety, osteoarthritis presented from home to the ED on 11/17/2020 with wound of left heel x 3 months. In the ED, she was found to have osteomyelitis. She was  treated with IV antibiotics.  Podiatry was consulted.  Patient underwent debridement and partial calicectomy on 7/74.  Infectious disease consulted recommended IV daptomycin and cefepime for 6 weeks.  Patient is a stable and awaiting discharge to SNF.  Wound VAC been removed.  Assessment & Plan:   Principal Problem:   Osteomyelitis ([place]) Active Problems:   Chronic obstructive pulmonary disease (HCC)   HLD (hyperlipidemia)   Coronary artery disease involving native coronary artery   PAD (peripheral artery disease) (HCC)   History of meningitis   Status post total hip replacement, left   Cellulitis of left lower extremity   Status post peripherally inserted central catheter (PICC) central line placement   Left heel osteomyelitis: status post debridement and partial colcectomy 9/15. Plan: --Continue IV daptomycin and cefepime for 6 weeks total, per ID --weekly CK monitoring on daptomycin. --Follow-up with Dr. Amalia Hailey (podiatry) in 2 weeks.   AKI, not POA --Cr peaked at 1.49 on 9/15, now back down to baseline ~1.  Normocytic normochromic anemia:  S/p 1 unit PRBC,  hemoglobin 10.0.   No signs of any obvious bleeding.   Hypertension: --cont Toprol 25 mg daily  Hyperlipidemia: --cont Lipitor  Type 2 diabetes: Hemoglobin A1c 7.5 on admission --cont long-acting insulin to 20 units. --cont SSI TID   Overactive bladder: --cont oxybutynin  Anxiety and depression --pt asked to be started on  antidepressant. --start Zoloft 25 mg nightly, to be titrated up as tolerated   DVT prophylaxis: Lovenox Code Status: Full code Family Communication:  Disposition Plan:  Status is: Inpatient  Dispo: The patient is from: Home              Anticipated d/c is to: SNF              Patient currently is medically stable to d/c.  Waiting on insurrance auth    Difficult to place patient No   Consultants:  Podiatry  Procedures: Left partial calcanectomy Antimicrobials:   Anti-infectives (From admission, onward)    Start     Dose/Rate Route Frequency Ordered Stop   11/24/20 1600  DAPTOmycin (CUBICIN) 400 mg in sodium chloride 0.9 % IVPB        6 mg/kg  62.6 kg 116 mL/hr over 30 Minutes Intravenous Daily 11/24/20 1411     11/21/20 1400  ceFEPIme (MAXIPIME) 2 g in sodium chloride 0.9 % 100 mL IVPB        2 g 200 mL/hr over 30 Minutes Intravenous Every 12 hours 11/21/20 0730     11/21/20 0141  ceFEPIme (MAXIPIME) 2 g in sodium chloride 0.9 % 100 mL IVPB  Status:  Discontinued        2 g 200 mL/hr over 30 Minutes Intravenous Every 24 hours 11/20/20 0923 11/21/20 0730   11/20/20 1800  linezolid (ZYVOX) IVPB 600 mg  Status:  Discontinued        600 mg 300 mL/hr over 60 Minutes Intravenous Every 12 hours 11/20/20 0915 11/24/20 1317   11/20/20 1702  vancomycin (VANCOCIN)  powder  Status:  Discontinued          As needed 11/20/20 1702 11/20/20 1743   11/20/20 1700  tobramycin (NEBCIN) powder  Status:  Discontinued          As needed 11/20/20 1701 11/20/20 1743   11/19/20 2200  vancomycin (VANCOCIN) IVPB 1000 mg/200 mL premix  Status:  Discontinued        1,000 mg 200 mL/hr over 60 Minutes Intravenous Every 24 hours 11/19/20 0934 11/20/20 0915   11/19/20 0100  vancomycin (VANCOREADY) IVPB 750 mg/150 mL  Status:  Discontinued        750 mg 150 mL/hr over 60 Minutes Intravenous Every 24 hours 11/18/20 0028 11/19/20 0934   11/18/20 0200  ceFEPIme (MAXIPIME) 2 g in sodium chloride 0.9 % 100 mL  IVPB  Status:  Discontinued        2 g 200 mL/hr over 30 Minutes Intravenous Every 12 hours 11/17/20 2347 11/20/20 0923   11/18/20 0100  vancomycin (VANCOREADY) IVPB 1500 mg/300 mL        1,500 mg 150 mL/hr over 120 Minutes Intravenous  Once 11/17/20 2347 11/18/20 0238       Subjective: Pt kept telling me that she heard from someone (not me, not TOC) that she is due to leave the hospital to SNF rehab now.    Later, RN reported pt was off and on tearful and anxious and asked to be put on some anti-depressant.   Objective: Vitals:   11/29/20 2337 11/30/20 0535 11/30/20 0744 11/30/20 1138  BP: (!) 116/42 (!) 135/51 (!) 132/54 (!) 122/45  Pulse: 87 88 89 93  Resp: 17 17 18 16   Temp: 98.7 F (37.1 C) 98.1 F (36.7 C) 98.3 F (36.8 C) 97.6 F (36.4 C)  TempSrc:    Oral  SpO2: 98% 99% 100% 98%  Weight:      Height:        Intake/Output Summary (Last 24 hours) at 11/30/2020 1523 Last data filed at 11/30/2020 1500 Gross per 24 hour  Intake 2816.82 ml  Output 1750 ml  Net 1066.82 ml   Filed Weights   11/17/20 1744 11/20/20 1538  Weight: 62.6 kg 62.6 kg    Examination:  Constitutional: NAD, AAOx3, sitting at side of bed brushing her hair. HEENT: conjunctivae and lids normal, EOMI CV: No cyanosis.   RESP: normal respiratory effort, on RA Extremities: left foot wrapped SKIN: warm, dry Neuro: II - XII grossly intact.   Psych: Normal mood and affect.     Data Reviewed: I have personally reviewed following labs and imaging studies  CBC: Recent Labs  Lab 11/26/20 0610 11/27/20 0435 11/28/20 0647 11/29/20 0627 11/30/20 0510  WBC 5.4 6.8 6.1 6.7 8.6  HGB 7.9* 10.0* 10.3* 10.0* 10.1*  HCT 24.5* 30.1* 31.2* 30.3* 31.3*  MCV 90.7 88.8 87.2 90.2 88.4  PLT 196 181 183 156 975   Basic Metabolic Panel: Recent Labs  Lab 11/26/20 0610 11/27/20 0435 11/28/20 0647 11/29/20 0627 11/30/20 0510  NA 138 139 137 137 138  K 4.3 4.6 4.4 4.5 4.6  CL 111 111 105 105 107  CO2  25 24 26 26 24   GLUCOSE 57* 90 84 142* 99  BUN 21 19 15 19 19   CREATININE 0.92 0.91 0.78 1.02* 0.96  CALCIUM 9.1 9.2 8.9 8.8* 8.9  MG  --   --   --   --  1.6*   GFR: Estimated Creatinine Clearance: 41.8 mL/min (by C-G formula  based on SCr of 0.96 mg/dL). Liver Function Tests: No results for input(s): AST, ALT, ALKPHOS, BILITOT, PROT, ALBUMIN in the last 168 hours. No results for input(s): LIPASE, AMYLASE in the last 168 hours. No results for input(s): AMMONIA in the last 168 hours. Coagulation Profile: No results for input(s): INR, PROTIME in the last 168 hours. Cardiac Enzymes: Recent Labs  Lab 11/24/20 0403  CKTOTAL 20*   BNP (last 3 results) No results for input(s): PROBNP in the last 8760 hours. HbA1C: No results for input(s): HGBA1C in the last 72 hours. CBG: Recent Labs  Lab 11/29/20 1122 11/29/20 1630 11/29/20 2040 11/30/20 0745 11/30/20 1205  GLUCAP 169* 167* 208* 112* 163*   Lipid Profile: No results for input(s): CHOL, HDL, LDLCALC, TRIG, CHOLHDL, LDLDIRECT in the last 72 hours. Thyroid Function Tests: No results for input(s): TSH, T4TOTAL, FREET4, T3FREE, THYROIDAB in the last 72 hours. Anemia Panel: No results for input(s): VITAMINB12, FOLATE, FERRITIN, TIBC, IRON, RETICCTPCT in the last 72 hours. Sepsis Labs: No results for input(s): PROCALCITON, LATICACIDVEN in the last 168 hours.  Recent Results (from the past 240 hour(s))  Aerobic/Anaerobic Culture w Gram Stain (surgical/deep wound)     Status: None   Collection Time: 11/20/20  5:08 PM   Specimen: PATH Bone biopsy; Tissue  Result Value Ref Range Status   Specimen Description   Final    HEEL Performed at [facility], [address], [place], [place] [address]    Special Requests   Final    LEFT HEEL BONE CULTURE Performed at [facility], [place], [place] [address]    Gram Stain NO WBC SEEN RARE GRAM NEGATIVE RODS   Final   Culture   Final    RARE  PSEUDOMONAS AERUGINOSA NO ANAEROBES ISOLATED Performed at [facility], [place] [address], [place], [place] [address]    Report Status 11/25/2020 FINAL  Final   Organism ID, Bacteria PSEUDOMONAS AERUGINOSA  Final      Susceptibility   Pseudomonas aeruginosa - MIC*    CEFTAZIDIME 4 SENSITIVE Sensitive     CIPROFLOXACIN <=0.25 SENSITIVE Sensitive     GENTAMICIN <=1 SENSITIVE Sensitive     IMIPENEM 2 SENSITIVE Sensitive     PIP/TAZO 16 SENSITIVE Sensitive     CEFEPIME 4 SENSITIVE Sensitive     * RARE PSEUDOMONAS AERUGINOSA  Aerobic/Anaerobic Culture w Gram Stain (surgical/deep wound)     Status: None   Collection Time: 11/20/20  5:08 PM   Specimen: PATH Bone biopsy; Tissue  Result Value Ref Range Status   Specimen Description   Final    WOUND Performed at [facility], [address], [place], [place] [address]    Special Requests   Final    LEFT HEEL WD CULTURE Performed at [facility], [place], [place] [address]    Gram Stain NO WBC SEEN NO ORGANISMS SEEN   Final   Culture   Final    No growth aerobically or anaerobically. Performed at [facility], [place] [address], [place], [place] [address]    Report Status 11/25/2020 FINAL  Final     Radiology Studies: No results found.  Scheduled Meds:  acetaminophen  1,000 mg Oral TID   atorvastatin  20 mg Oral Daily   Chlorhexidine Gluconate Cloth  6 each Topical Daily   enoxaparin (LOVENOX) injection  40 mg Subcutaneous Q24H   [START ON 12/01/2020] influenza vaccine adjuvanted  0.5 mL Intramuscular  Tomorrow-1000   insulin aspart  0-6 Units Subcutaneous TID WC   insulin glargine-yfgn  20 Units Subcutaneous Daily   metoprolol succinate  25 mg Oral Daily   oxybutynin  10 mg Oral QHS   pantoprazole  80 mg Oral QAC breakfast   sodium chloride flush  10-40 mL Intracatheter Q12H   vitamin B-12  1,000 mcg Oral Daily   Continuous Infusions:  sodium chloride Stopped (11/30/20 0909)    ceFEPime (MAXIPIME) IV 2 g (11/30/20 4033)   DAPTOmycin (CUBICIN)  IV 400 mg (11/29/20 1955)     LOS: 12 days     Enzo Bi, MD [facility]   If 7PM-7AM, please contact night-coverage

## 2020-12-01 ENCOUNTER — Ambulatory Visit: Payer: Medicare Other | Admitting: Internal Medicine

## 2020-12-01 ENCOUNTER — Telehealth: Payer: Self-pay

## 2020-12-01 DIAGNOSIS — Z792 Long term (current) use of antibiotics: Secondary | ICD-10-CM | POA: Diagnosis not present

## 2020-12-01 DIAGNOSIS — R4182 Altered mental status, unspecified: Secondary | ICD-10-CM | POA: Diagnosis present

## 2020-12-01 DIAGNOSIS — N179 Acute kidney failure, unspecified: Secondary | ICD-10-CM | POA: Diagnosis not present

## 2020-12-01 DIAGNOSIS — M6281 Muscle weakness (generalized): Secondary | ICD-10-CM | POA: Diagnosis not present

## 2020-12-01 DIAGNOSIS — M8610 Other acute osteomyelitis, unspecified site: Secondary | ICD-10-CM | POA: Diagnosis not present

## 2020-12-01 DIAGNOSIS — Z20822 Contact with and (suspected) exposure to covid-19: Secondary | ICD-10-CM | POA: Diagnosis not present

## 2020-12-01 DIAGNOSIS — Z8249 Family history of ischemic heart disease and other diseases of the circulatory system: Secondary | ICD-10-CM | POA: Diagnosis not present

## 2020-12-01 DIAGNOSIS — E1152 Type 2 diabetes mellitus with diabetic peripheral angiopathy with gangrene: Secondary | ICD-10-CM | POA: Diagnosis not present

## 2020-12-01 DIAGNOSIS — I5030 Unspecified diastolic (congestive) heart failure: Secondary | ICD-10-CM | POA: Diagnosis not present

## 2020-12-01 DIAGNOSIS — Z6826 Body mass index (BMI) 26.0-26.9, adult: Secondary | ICD-10-CM | POA: Diagnosis not present

## 2020-12-01 DIAGNOSIS — G9341 Metabolic encephalopathy: Secondary | ICD-10-CM | POA: Diagnosis not present

## 2020-12-01 DIAGNOSIS — Z833 Family history of diabetes mellitus: Secondary | ICD-10-CM | POA: Diagnosis not present

## 2020-12-01 DIAGNOSIS — R262 Difficulty in walking, not elsewhere classified: Secondary | ICD-10-CM | POA: Diagnosis not present

## 2020-12-01 DIAGNOSIS — Z79899 Other long term (current) drug therapy: Secondary | ICD-10-CM | POA: Diagnosis not present

## 2020-12-01 DIAGNOSIS — Z87891 Personal history of nicotine dependence: Secondary | ICD-10-CM | POA: Diagnosis not present

## 2020-12-01 DIAGNOSIS — I2510 Atherosclerotic heart disease of native coronary artery without angina pectoris: Secondary | ICD-10-CM | POA: Diagnosis not present

## 2020-12-01 DIAGNOSIS — F5101 Primary insomnia: Secondary | ICD-10-CM | POA: Diagnosis not present

## 2020-12-01 DIAGNOSIS — N3001 Acute cystitis with hematuria: Secondary | ICD-10-CM | POA: Diagnosis not present

## 2020-12-01 DIAGNOSIS — Z7985 Long-term (current) use of injectable non-insulin antidiabetic drugs: Secondary | ICD-10-CM | POA: Diagnosis not present

## 2020-12-01 DIAGNOSIS — I130 Hypertensive heart and chronic kidney disease with heart failure and stage 1 through stage 4 chronic kidney disease, or unspecified chronic kidney disease: Secondary | ICD-10-CM | POA: Diagnosis not present

## 2020-12-01 DIAGNOSIS — M05131 Rheumatoid lung disease with rheumatoid arthritis of right wrist: Secondary | ICD-10-CM | POA: Diagnosis not present

## 2020-12-01 DIAGNOSIS — I110 Hypertensive heart disease with heart failure: Secondary | ICD-10-CM | POA: Diagnosis not present

## 2020-12-01 DIAGNOSIS — R630 Anorexia: Secondary | ICD-10-CM | POA: Diagnosis not present

## 2020-12-01 DIAGNOSIS — R110 Nausea: Secondary | ICD-10-CM | POA: Diagnosis not present

## 2020-12-01 DIAGNOSIS — J811 Chronic pulmonary edema: Secondary | ICD-10-CM | POA: Diagnosis not present

## 2020-12-01 DIAGNOSIS — Z23 Encounter for immunization: Secondary | ICD-10-CM | POA: Diagnosis not present

## 2020-12-01 DIAGNOSIS — F410 Panic disorder [episodic paroxysmal anxiety] without agoraphobia: Secondary | ICD-10-CM | POA: Diagnosis present

## 2020-12-01 DIAGNOSIS — F32A Depression, unspecified: Secondary | ICD-10-CM | POA: Diagnosis not present

## 2020-12-01 DIAGNOSIS — M86172 Other acute osteomyelitis, left ankle and foot: Secondary | ICD-10-CM | POA: Diagnosis not present

## 2020-12-01 DIAGNOSIS — E1169 Type 2 diabetes mellitus with other specified complication: Secondary | ICD-10-CM | POA: Diagnosis not present

## 2020-12-01 DIAGNOSIS — E1122 Type 2 diabetes mellitus with diabetic chronic kidney disease: Secondary | ICD-10-CM | POA: Diagnosis not present

## 2020-12-01 DIAGNOSIS — E119 Type 2 diabetes mellitus without complications: Secondary | ICD-10-CM | POA: Diagnosis not present

## 2020-12-01 DIAGNOSIS — R404 Transient alteration of awareness: Secondary | ICD-10-CM | POA: Diagnosis not present

## 2020-12-01 DIAGNOSIS — L97429 Non-pressure chronic ulcer of left heel and midfoot with unspecified severity: Secondary | ICD-10-CM | POA: Diagnosis not present

## 2020-12-01 DIAGNOSIS — J9811 Atelectasis: Secondary | ICD-10-CM | POA: Diagnosis not present

## 2020-12-01 DIAGNOSIS — L089 Local infection of the skin and subcutaneous tissue, unspecified: Secondary | ICD-10-CM | POA: Diagnosis not present

## 2020-12-01 DIAGNOSIS — E11319 Type 2 diabetes mellitus with unspecified diabetic retinopathy without macular edema: Secondary | ICD-10-CM | POA: Diagnosis not present

## 2020-12-01 DIAGNOSIS — E785 Hyperlipidemia, unspecified: Secondary | ICD-10-CM | POA: Diagnosis not present

## 2020-12-01 DIAGNOSIS — J42 Unspecified chronic bronchitis: Secondary | ICD-10-CM | POA: Diagnosis not present

## 2020-12-01 DIAGNOSIS — E782 Mixed hyperlipidemia: Secondary | ICD-10-CM | POA: Diagnosis not present

## 2020-12-01 DIAGNOSIS — R970 Elevated carcinoembryonic antigen [CEA]: Secondary | ICD-10-CM | POA: Diagnosis not present

## 2020-12-01 DIAGNOSIS — I499 Cardiac arrhythmia, unspecified: Secondary | ICD-10-CM | POA: Diagnosis not present

## 2020-12-01 DIAGNOSIS — Z96642 Presence of left artificial hip joint: Secondary | ICD-10-CM | POA: Diagnosis not present

## 2020-12-01 DIAGNOSIS — Z8673 Personal history of transient ischemic attack (TIA), and cerebral infarction without residual deficits: Secondary | ICD-10-CM | POA: Diagnosis not present

## 2020-12-01 DIAGNOSIS — E569 Vitamin deficiency, unspecified: Secondary | ICD-10-CM | POA: Diagnosis not present

## 2020-12-01 DIAGNOSIS — M869 Osteomyelitis, unspecified: Secondary | ICD-10-CM | POA: Diagnosis not present

## 2020-12-01 DIAGNOSIS — E11621 Type 2 diabetes mellitus with foot ulcer: Secondary | ICD-10-CM | POA: Diagnosis not present

## 2020-12-01 DIAGNOSIS — L03116 Cellulitis of left lower limb: Secondary | ICD-10-CM | POA: Diagnosis not present

## 2020-12-01 DIAGNOSIS — Z794 Long term (current) use of insulin: Secondary | ICD-10-CM | POA: Diagnosis not present

## 2020-12-01 DIAGNOSIS — I10 Essential (primary) hypertension: Secondary | ICD-10-CM | POA: Diagnosis not present

## 2020-12-01 DIAGNOSIS — E113293 Type 2 diabetes mellitus with mild nonproliferative diabetic retinopathy without macular edema, bilateral: Secondary | ICD-10-CM | POA: Diagnosis not present

## 2020-12-01 DIAGNOSIS — G253 Myoclonus: Secondary | ICD-10-CM | POA: Diagnosis not present

## 2020-12-01 DIAGNOSIS — Z741 Need for assistance with personal care: Secondary | ICD-10-CM | POA: Diagnosis not present

## 2020-12-01 DIAGNOSIS — N3281 Overactive bladder: Secondary | ICD-10-CM | POA: Diagnosis not present

## 2020-12-01 DIAGNOSIS — N189 Chronic kidney disease, unspecified: Secondary | ICD-10-CM | POA: Diagnosis not present

## 2020-12-01 DIAGNOSIS — J449 Chronic obstructive pulmonary disease, unspecified: Secondary | ICD-10-CM | POA: Diagnosis not present

## 2020-12-01 DIAGNOSIS — M810 Age-related osteoporosis without current pathological fracture: Secondary | ICD-10-CM | POA: Diagnosis not present

## 2020-12-01 DIAGNOSIS — E0590 Thyrotoxicosis, unspecified without thyrotoxic crisis or storm: Secondary | ICD-10-CM | POA: Diagnosis not present

## 2020-12-01 DIAGNOSIS — I252 Old myocardial infarction: Secondary | ICD-10-CM | POA: Diagnosis not present

## 2020-12-01 DIAGNOSIS — I452 Bifascicular block: Secondary | ICD-10-CM | POA: Diagnosis not present

## 2020-12-01 DIAGNOSIS — Z743 Need for continuous supervision: Secondary | ICD-10-CM | POA: Diagnosis not present

## 2020-12-01 DIAGNOSIS — I517 Cardiomegaly: Secondary | ICD-10-CM | POA: Diagnosis not present

## 2020-12-01 DIAGNOSIS — M86179 Other acute osteomyelitis, unspecified ankle and foot: Secondary | ICD-10-CM | POA: Diagnosis not present

## 2020-12-01 DIAGNOSIS — R600 Localized edema: Secondary | ICD-10-CM | POA: Diagnosis not present

## 2020-12-01 DIAGNOSIS — N281 Cyst of kidney, acquired: Secondary | ICD-10-CM | POA: Diagnosis not present

## 2020-12-01 DIAGNOSIS — R6889 Other general symptoms and signs: Secondary | ICD-10-CM | POA: Diagnosis not present

## 2020-12-01 DIAGNOSIS — N390 Urinary tract infection, site not specified: Secondary | ICD-10-CM | POA: Diagnosis not present

## 2020-12-01 DIAGNOSIS — R000 Tachycardia, unspecified: Secondary | ICD-10-CM | POA: Diagnosis not present

## 2020-12-01 DIAGNOSIS — I5022 Chronic systolic (congestive) heart failure: Secondary | ICD-10-CM | POA: Diagnosis not present

## 2020-12-01 DIAGNOSIS — Z888 Allergy status to other drugs, medicaments and biological substances status: Secondary | ICD-10-CM | POA: Diagnosis not present

## 2020-12-01 DIAGNOSIS — E876 Hypokalemia: Secondary | ICD-10-CM | POA: Diagnosis not present

## 2020-12-01 DIAGNOSIS — Z8661 Personal history of infections of the central nervous system: Secondary | ICD-10-CM | POA: Diagnosis not present

## 2020-12-01 DIAGNOSIS — K219 Gastro-esophageal reflux disease without esophagitis: Secondary | ICD-10-CM | POA: Diagnosis not present

## 2020-12-01 DIAGNOSIS — N1831 Chronic kidney disease, stage 3a: Secondary | ICD-10-CM | POA: Diagnosis not present

## 2020-12-01 DIAGNOSIS — Z8349 Family history of other endocrine, nutritional and metabolic diseases: Secondary | ICD-10-CM | POA: Diagnosis not present

## 2020-12-01 DIAGNOSIS — Z882 Allergy status to sulfonamides status: Secondary | ICD-10-CM | POA: Diagnosis not present

## 2020-12-01 LAB — BASIC METABOLIC PANEL
Anion gap: 4 — ABNORMAL LOW (ref 5–15)
BUN: 21 mg/dL (ref 8–23)
CO2: 24 mmol/L (ref 22–32)
Calcium: 8.7 mg/dL — ABNORMAL LOW (ref 8.9–10.3)
Chloride: 107 mmol/L (ref 98–111)
Creatinine, Ser: 1 mg/dL (ref 0.44–1.00)
GFR, Estimated: 59 mL/min — ABNORMAL LOW (ref >60)
Glucose, Bld: 151 mg/dL — ABNORMAL HIGH (ref 70–99)
Potassium: 4.9 mmol/L (ref 3.5–5.1)
Sodium: 135 mmol/L (ref 135–145)

## 2020-12-01 LAB — RESP PANEL BY RT-PCR (FLU A&B, COVID) ARPGX2
Influenza A by PCR: NEGATIVE
Influenza B by PCR: NEGATIVE
SARS Coronavirus 2 by RT PCR: NEGATIVE

## 2020-12-01 LAB — CBC
HCT: 30.5 % — ABNORMAL LOW (ref 36.0–46.0)
Hemoglobin: 9.7 g/dL — ABNORMAL LOW (ref 12.0–15.0)
MCH: 28.4 pg (ref 26.0–34.0)
MCHC: 31.8 g/dL (ref 30.0–36.0)
MCV: 89.2 fL (ref 80.0–100.0)
Platelets: 174 10*3/uL (ref 150–400)
RBC: 3.42 MIL/uL — ABNORMAL LOW (ref 3.87–5.11)
RDW: 16.9 % — ABNORMAL HIGH (ref 11.5–15.5)
WBC: 9.2 10*3/uL (ref 4.0–10.5)
nRBC: 0.2 % (ref 0.0–0.2)

## 2020-12-01 LAB — GLUCOSE, CAPILLARY
Glucose-Capillary: 123 mg/dL — ABNORMAL HIGH (ref 70–99)
Glucose-Capillary: 168 mg/dL — ABNORMAL HIGH (ref 70–99)

## 2020-12-01 LAB — CK: Total CK: 26 U/L — ABNORMAL LOW (ref 38–234)

## 2020-12-01 LAB — MAGNESIUM: Magnesium: 2.8 mg/dL — ABNORMAL HIGH (ref 1.7–2.4)

## 2020-12-01 MED ORDER — DAPTOMYCIN IV (FOR PTA / DISCHARGE USE ONLY): 400.0000 mg | INTRAVENOUS | 0 refills | Status: AC

## 2020-12-01 MED ORDER — LANTUS SOLOSTAR 100 UNIT/ML ~~LOC~~ SOPN: 20.0000 [IU] | PEN_INJECTOR | Freq: Every day | SUBCUTANEOUS | 3 refills | Status: DC

## 2020-12-01 MED ORDER — ATORVASTATIN CALCIUM 20 MG PO TABS: 20.0000 mg | ORAL_TABLET | Freq: Every day | ORAL | Status: DC

## 2020-12-01 MED ORDER — OXYCODONE HCL 5 MG PO TABS: 5.0000 mg | ORAL_TABLET | ORAL | 0 refills | Status: AC | PRN

## 2020-12-01 MED ORDER — CEFEPIME IV (FOR PTA / DISCHARGE USE ONLY): 2.0000 g | Freq: Two times a day (BID) | INTRAVENOUS | 0 refills | Status: DC

## 2020-12-01 NOTE — Telephone Encounter (Signed)
Patients daughter called office and stated that [facility] is at [facility] currently for skilled nursing, appt for Thursday was cancelled and patient will follow back with [place] upon discharge. KW

## 2020-12-01 NOTE — Telephone Encounter (Signed)
Pt is being discharged from hospital and Manuela Schwartz with hospital called to schedule hospital f/u for Wed or Thursday this week. I didn't see availability with any provider this week for hospital f/u. Can pt be worked in? Please advise. Thanks TNP

## 2020-12-01 NOTE — Discharge Summary (Addendum)
[place] at [facility]   PATIENT NAME: Andrea [name]    MR#:  [id number]  DATE OF BIRTH:  09/24/1945  DATE OF ADMISSION:  11/17/2020   ADMITTING PHYSICIAN: Sidney Ace, MD  DATE OF DISCHARGE: 12/01/2020  PRIMARY CARE PHYSICIAN: [name], FNP   ADMISSION DIAGNOSIS:  Osteomyelitis ([place]) [M86.9] Osteomyelitis, unspecified site, unspecified type ([place]) [M86.9] DISCHARGE DIAGNOSIS:  Principal Problem:   Osteomyelitis ([place]) Active Problems:   Chronic obstructive pulmonary disease (HCC)   HLD (hyperlipidemia)   Coronary artery disease involving native coronary artery   PAD (peripheral artery disease) (HCC)   History of meningitis   Status post total hip replacement, left   Cellulitis of left lower extremity   Status post peripherally inserted central catheter (PICC) central line placement  SECONDARY DIAGNOSIS:   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   Depression    Diabetes mellitus type 2, uncomplicated (HCC)    Diabetic retinopathy ([place])    Dupuytren contracture 2011   s/p surgery to LEFT hand   Frequent urinary tract infections    GERD (gastroesophageal reflux disease)    Grade I diastolic dysfunction    HTN (hypertension)    Hyperlipidemia, unspecified    Irritable bowel syndrome    Meningitis 11/2019   MI (myocardial infarction) ([place]) 1994   no stent   Neuromuscular disorder (HCC)    nerve pain in back and legs   Osteoporosis    Pneumonia 2021   RBBB    Sepsis ([place]) 11/2019   Skin cancer    TIA (transient ischemic attack) 2014   Vitamin D deficiency, unspecified    Wheezing    HOSPITAL COURSE:  75 year old female with a known history of COPD, CAD, meningitis, DM type II,  essential hypertension, hyperlipidemia, GERD, history of TIA, grade 1 diastolic dysfunction, anxiety, depression, DJD was admitted for left heel osteomyelitis  In the ED, she was found to have osteomyelitis. She was  treated with IV antibiotics.  Podiatry was consulted.  Patient underwent debridement and partial calicectomy on 1/51.  Infectious disease consulted recommended IV daptomycin and cefepime for 6 weeks.  Patient is a stable for discharge to SNF.  Wound VAC been removed.[name] Kitchen  New wound VAC will be applied at [facility].  Left heel osteomyelitis present on admission S/p debridement and partial calcenectomy on 9/15 Patient will need weekly CK monitoring while on daptomycin  And IV daptomycin and cefepime needs to be continued till 12/28/2020 for total 6 weeks per ID -Outpatient follow-up with Dr. Wallis Mart in 2 weeks  AKI Creatinine peaked at 1.49 on 9/15.  Now resolved with hydration   Normocytic normochromic anemia Status post 1 unit PRBC transfusion with stable H&H now No obvious bleeding  Essential hypertension Controlled on current regimen  Hyperlipidemia On statin   DM type II A1c 7.5 Continue home regimen   Overactive bladder On oxybutynin  Anxiety and depression Continue home regimen    DISCHARGE CONDITIONS:   stable CONSULTS OBTAINED:  Treatment Team:  Edrick Kins, DPM Richarda Osmond, MD DRUG ALLERGIES:   Allergies  Allergen Reactions   Metformin And Related Diarrhea  Sulfa Antibiotics Rash   DISCHARGE MEDICATIONS:   Allergies as of 12/01/2020       Reactions   Metformin And Related Diarrhea   Sulfa Antibiotics Rash        Medication List     STOP taking these medications    cephALEXin 500 MG capsule Commonly known as: KEFLEX   enoxaparin 40 MG/0.4ML injection Commonly known as: LOVENOX   HYDROcodone-acetaminophen 5-325 MG tablet Commonly known as: NORCO/VICODIN   SINUS RINSE NA       TAKE these medications     Alcohol Prep Pads Use twice daily prior to SQ injection of insulin to clean skin   atorvastatin 20 MG tablet Commonly known as: LIPITOR Take 1 tablet (20 mg total) by mouth daily. Start taking on: December 02, 2020 What changed:  medication strength how much to take   B-12 1000 MCG Caps Take 1,000 mcg by mouth daily.   BERBERINE COMPLEX PO Take 1 tablet by mouth 2 (two) times daily with a meal.   Calcium Carbonate-Vitamin D 600-400 MG-UNIT tablet Take 1 tablet by mouth 2 (two) times daily.   ceFEPime  IVPB Commonly known as: MAXIPIME Inject 2 g into the vein every 12 (twelve) hours. Indication:  Left heel ulcer with osteomyelitis First Dose: Yes Last Day of Therapy:  12/28/2020 Labs - Once weekly:  CBC/D, CMP, ESR, CK Method of administration: IV Push Method of administration may be changed at the discretion of home infusion pharmacist based upon assessment of the patient and/or caregiver's ability to self-administer the medication ordered.   daptomycin  IVPB Commonly known as: CUBICIN Inject 400 mg into the vein daily. Indication:  Left heel ulcer with osteomyelitis First Dose: Yes Last Day of Therapy:  12/28/2020 Labs - Once weekly:  CBC/D, CMP, CK, ESR Method of administration: IV Push Method of administration may be changed at the discretion of home infusion pharmacist based upon assessment of the patient and/or caregiver's ability to self-administer the medication ordered.   furosemide 20 MG tablet Commonly known as: LASIX Take 1 tablet (20 mg total) by mouth daily.   INSULIN SYRINGE 1CC/29G 29G X 1/2" 1 ML Misc For lantus injections twice daily   Lantus SoloStar 100 UNIT/ML Solostar Pen Generic drug: insulin glargine Inject 20 Units into the skin at bedtime. What changed: how much to take   losartan 25 MG tablet Commonly known as: COZAAR TAKE 1 TABLET BY MOUTH EVERY DAY   metoprolol succinate 25 MG 24 hr tablet Commonly known as: TOPROL-XL TAKE 1  TABLET BY MOUTH EVERY DAY   Omega-3 1000 MG Caps Take 1,000 mg by mouth daily.   omeprazole 40 MG capsule Commonly known as: PRILOSEC TAKE 1 CAPSULE BY MOUTH EVERY DAY   OneTouch Delica Lancets 63Z Misc To check blood sugar daily  DX: E11.9   OneTouch Verio test strip Generic drug: glucose blood TO CHECK BLOOD SUGAR ONCE DAILY.   oxybutynin 10 MG 24 hr tablet Commonly known as: DITROPAN-XL TAKE 1 TABLET BY MOUTH EVERYDAY AT BEDTIME   oxyCODONE 5 MG immediate release tablet Commonly known as: Oxy IR/ROXICODONE Take 1 tablet (5 mg total) by mouth every 4 (four) hours as needed for up to 4 days for moderate pain.   potassium chloride 10 MEQ tablet Commonly known as: KLOR-CON Take 1 tablet (10 mEq total) by mouth daily.   PRESERVISION AREDS 2 PO Take 1 tablet by mouth in the morning and at bedtime.   Santyl ointment Generic drug: collagenase Apply  1 application topically daily.   TRUEplus Pen Needles 31G X 6 MM Misc Generic drug: Insulin Pen Needle Use with lantus two times daily.   Trulicity 1.5 PI/9.5JO Sopn Generic drug: Dulaglutide INJECT 1.5MG INTO THE SKIN ONCE A WEEK AS DIRECTED   Vitamin D (Ergocalciferol) 1.25 MG (50000 UNIT) Caps capsule Commonly known as: DRISDOL TAKE ONE CAPSULE BY MOUTH ONCE WEEKLY ON THE SAME DAY EACH WEEK (ON FRIDAYS)               Discharge Care Instructions  (From admission, onward)           Start     Ordered   12/01/20 0000  Change dressing on IV access line weekly and PRN  (Home infusion instructions - [facility] )        12/01/20 0931   12/01/20 0000  Discharge wound care:       Comments: As above   12/01/20 0931           DISCHARGE INSTRUCTIONS:  The facility will place the NP WT dressing to the left heel wound upon arrival.  Wound care nurse has provided nursing with guidance for placement of normal saline dampened dressing over the wound and to change this twice daily until NP WT device is  obtained and therapy can be initiated at [facility] facility. DIET:  Cardiac diet DISCHARGE CONDITION:  Stable ACTIVITY:  Activity as tolerated OXYGEN:  Home Oxygen: No.  Oxygen Delivery: room air DISCHARGE LOCATION:  nursing home   If you experience worsening of your admission symptoms, develop shortness of breath, life threatening emergency, suicidal or homicidal thoughts you must seek medical attention immediately by calling 911 or calling your MD immediately  if symptoms less severe.  You Must read complete instructions/literature along with all the possible adverse reactions/side effects for all the Medicines you take and that have been prescribed to you. Take any new Medicines after you have completely understood and accpet all the possible adverse reactions/side effects.   Please note  You were cared for by a hospitalist during your hospital stay. If you have any questions about your discharge medications or the care you received while you were in the hospital after you are discharged, you can call the unit and asked to speak with the hospitalist on call if the hospitalist that took care of you is not available. Once you are discharged, your primary care physician will handle any further medical issues. Please note that NO REFILLS for any discharge medications will be authorized once you are discharged, as it is imperative that you return to your primary care physician (or establish a relationship with a primary care physician if you do not have one) for your aftercare needs so that they can reassess your need for medications and monitor your lab values.    On the day of Discharge:  VITAL SIGNS:  Blood pressure 125/74, pulse 86, temperature 98 F (36.7 C), temperature source Oral, resp. rate 14, height 5' (1.524 m), weight 62.6 kg, SpO2 99 %. PHYSICAL EXAMINATION:  GENERAL:  75 y.o.-year-old patient lying in the bed with no acute distress.  EYES: Pupils equal, round, reactive  to light and accommodation. No scleral icterus. Extraocular muscles intact.  HEENT: Head atraumatic, normocephalic. Oropharynx and nasopharynx clear.  NECK:  Supple, no jugular venous distention. No thyroid enlargement, no tenderness.  LUNGS: Normal breath sounds bilaterally, no wheezing, rales,rhonchi or crepitation. No use of accessory muscles of respiration.  CARDIOVASCULAR: S1, S2  normal. No murmurs, rubs, or gallops.  ABDOMEN: Soft, non-tender, non-distended. Bowel sounds present. No organomegaly or mass.  EXTREMITIES: No pedal edema, cyanosis, or clubbing.  NEUROLOGIC: Cranial nerves II through XII are intact. Muscle strength 5/5 in all extremities. Sensation intact. Gait not checked.  PSYCHIATRIC: The patient is alert and oriented x 3.  SKIN: See picture   DATA REVIEW:   CBC Recent Labs  Lab 12/01/20 0508  WBC 9.2  HGB 9.7*  HCT 30.5*  PLT 174    Chemistries  Recent Labs  Lab 12/01/20 0508  NA 135  K 4.9  CL 107  CO2 24  GLUCOSE 151*  BUN 21  CREATININE 1.00  CALCIUM 8.7*  MG 2.8*     Outpatient follow-up  Contact information for follow-up providers     [name], FNP. Schedule an appointment as soon as possible for a visit in 2 day(s).   Specialty: Family Medicine Why: [facility] Discharge F/UP;  Office will contact patient for Appt. Contact information: [place] [address] [phone]         Minna Merritts, MD. Schedule an appointment as soon as possible for a visit in 2 week(s).   Specialty: Cardiology Why: [facility] Discharge F/UP Contact information: [place] [address] [phone]         Tsosie Billing, MD. Schedule an appointment as soon as possible for a visit in 3 week(s).   Specialty: Infectious Diseases Why: [facility] Discharge F/UP Contact information: [place] [address] [phone]              Contact information  for after-discharge care     Destination     [facility] SN[facility] Preferred SNF .   Service: Skilled Nursing Contact information: [address] [place] [phone]                     30 Day Unplanned Readmission Risk Score    Flowsheet Row ED to Hosp-Admission (Current) from 11/17/2020 in [place] (1A)  30 Day Unplanned Readmission Risk Score (%) 25.31 Filed at 12/01/2020 1200       This score is the patient's risk of an unplanned readmission within 30 days of being discharged (0 -100%). The score is based on dignosis, age, lab data, medications, orders, and past utilization.   Low:  0-14.9   Medium: 15-21.9   High: 22-29.9   Extreme: 30 and above          Management plans discussed with the patient, nursing and they are in agreement.  CODE STATUS: Full Code   TOTAL TIME TAKING CARE OF THIS PATIENT: 45 minutes.    Max Sane M.D on 12/01/2020 at 4:16 PM  [facility]   CC: Primary care physician; [name], FNP   Note: This dictation was prepared with Dragon dictation along with smaller phrase technology. Any transcriptional errors that result from this process are unintentional.

## 2020-12-01 NOTE — Care Management Important Message (Signed)
Important Message  Patient Details  Name: Andrea [name] MRN: [id number] Date of Birth: Feb 13, 1946   Medicare Important Message Given:  Yes     Loann Quill 12/01/2020, 12:50 PM

## 2020-12-01 NOTE — Telephone Encounter (Signed)
There is no call back number to reach susan, will leave voicemail message for patient that she will have an appt this Thursday at 4pm with [name] for hospital follow up. KW

## 2020-12-01 NOTE — TOC Progression Note (Addendum)
Transition of Care [facility]) - Progression Note    Patient Details  Name: Andrea [name] MRN: [id number] Date of Birth: [birth year]  Transition [facility]) CM/SW Kuna, RN Phone Number: 12/01/2020, 9:42 AM  Clinical Narrative:    The patient will [facility] today and will get the Wound Vac applied there, Daughter will transport to [facility] when DC is ready. Covid test pending, Going to room 704 at [facility]        Expected Discharge Plan and Services           Expected Discharge Date: 12/01/20                                     Social Determinants of Health (SDOH) Interventions    Readmission Risk Interventions Readmission Risk Prevention Plan 08/21/2020  Transportation Screening Complete  PCP or Specialist Appt within 5-7 Days Complete  Home Care Screening Complete  Medication Review (RN CM) Complete  Some recent data might be hidden

## 2020-12-01 NOTE — Progress Notes (Signed)
Physical Therapy Treatment Patient Details Name: Andrea [name] MRN: [id number] DOB: 07-06-1945 Today's Date: 12/01/2020   History of Present Illness 75 y.o. female with medical history significant for insulin-dependent diabetes mellitus, hypertension, hyperlipidemia, GERD, history of meningitis, CAD, COPD, history of TIA, grade 1 diastolic dysfunction, depression, anxiety, osteoarthritis, who presents to the emergency department from home on 11/17/2020 at the request of her PCP for chief concerns of osteomyelitis of the left heel.  L hip surgery on 08/19/2020. Underwent debridement of heel ulcer with partial calcanectomy and placement of antibiotic beads with application of wound vac left on 11/20/2020. NWB L LE.    PT Comments    Patient is agreeable to PT and hopeful to be discharged to rehab today. Patient continues to require minimal assistance for standing. Education on techniques for maintaining NWB of LLE with functional activity and transfers. Patient has difficulty maintaining NWB in standing with limited overall activity tolerance. Recommend SNF at discharge for ongoing PT to maximize independence and decrease caregiver burden prior to return home.    Recommendations for follow up therapy are one component of a multi-disciplinary discharge planning process, led by the attending physician.  Recommendations may be updated based on patient status, additional functional criteria and insurance authorization.  Follow Up Recommendations  SNF     Equipment Recommendations  3in1 (PT)    Recommendations for Other Services       Precautions / Restrictions Precautions Precautions: Fall Restrictions Weight Bearing Restrictions: Yes LLE Weight Bearing: Non weight bearing     Mobility  Bed Mobility Overal bed mobility: Modified Independent Bed Mobility: Supine to Sit     Supine to sit: Modified independent (Device/Increase time)     General bed mobility comments: increased time  and effort. patient relying on bed rails for support    Transfers Overall transfer level: Needs assistance Equipment used: Rolling walker (2 wheeled) Transfers: Sit to/from Stand Sit to Stand: Min assist Stand pivot transfers: Min guard       General transfer comment: verbal cues for technique, hand placement, foot positioning and technique to maintain NWB of LLE with functional activity. patient has difficulty maintaining NWB of LLE with standing. limited overall standing tolerance  Ambulation/Gait             General Gait Details: unable to progress ambulation past stand pivot transfers as patient has difficulty maintaining NWB of LLE in standing   Stairs             Wheelchair Mobility    Modified Rankin (Stroke Patients Only)       Balance Overall balance assessment: Needs assistance Sitting-balance support: No upper extremity supported;Feet supported Sitting balance-Leahy Scale: Good     Standing balance support: Bilateral upper extremity supported Standing balance-Leahy Scale: Fair Standing balance comment: no loss of balance, however patient has difficulty with NWB of LLE in standing. limited endurance for functional activity in standing                            Cognition Arousal/Alertness: Awake/alert Behavior During Therapy: WFL for tasks assessed/performed Overall Cognitive Status: Within Functional Limits for tasks assessed                                        Exercises General Exercises - Lower Extremity Long Arc Quad: AROM;Strengthening;10 reps;Seated Hip Flexion/Marching: AROM;Strengthening;Both;10 reps;Seated  Other Exercises Other Exercises: verbal and tactile cues for exercise technique. encouraged patient to  have LE elevated while in the chair and in bed for edema and pain control    General Comments        Pertinent Vitals/Pain Pain Assessment: 0-10 Pain Score: 4  Pain Location: left foot Pain  Descriptors / Indicators: Discomfort Pain Intervention(s): Limited activity within patient's tolerance;RN gave pain meds during session    Home Living                      Prior Function            PT Goals (current goals can now be found in the care plan section) Acute Rehab PT Goals Patient Stated Goal: to be independent PT Goal Formulation: With patient Time For Goal Achievement: 12/06/20 Potential to Achieve Goals: Fair Progress towards PT goals: Progressing toward goals    Frequency    Min 2X/week      PT Plan Current plan remains appropriate    Co-evaluation              AM-PAC PT "6 Clicks" Mobility   Outcome Measure  Help needed turning from your back to your side while in a flat bed without using bedrails?: None Help needed moving from lying on your back to sitting on the side of a flat bed without using bedrails?: A Little Help needed moving to and from a bed to a chair (including a wheelchair)?: A Little Help needed standing up from a chair using your arms (e.g., wheelchair or bedside chair)?: A Little Help needed to walk in hospital room?: Total Help needed climbing 3-5 steps with a railing? : Total 6 Click Score: 15    End of Session   Activity Tolerance: Patient tolerated treatment well Patient left: in chair;with call bell/phone within reach;with chair alarm set   PT Visit Diagnosis: Unsteadiness on feet (R26.81);Other abnormalities of gait and mobility (R26.89);Muscle weakness (generalized) (M62.81);Difficulty in walking, not elsewhere classified (R26.2)     Time: 6333-5456 PT Time Calculation (min) (ACUTE ONLY): 38 min  Charges:  $Therapeutic Exercise: 8-22 mins $Therapeutic Activity: 23-37 mins                     Minna Merritts, PT, MPT    Percell Locus 12/01/2020, 9:22 AM

## 2020-12-01 NOTE — Plan of Care (Signed)
  Problem: Education: Goal: Knowledge of General Education information will improve Description: Including pain rating scale, medication(s)/side effects and non-pharmacologic comfort measures Outcome: Adequate for Discharge   Problem: Health Behavior/Discharge Planning: Goal: Ability to manage health-related needs will improve Outcome: Adequate for Discharge   Problem: Clinical Measurements: Goal: Ability to maintain clinical measurements within normal limits will improve Outcome: Adequate for Discharge Goal: Will remain free from infection Outcome: Adequate for Discharge   Problem: Activity: Goal: Risk for activity intolerance will decrease Outcome: Adequate for Discharge   Problem: Nutrition: Goal: Adequate nutrition will be maintained Outcome: Adequate for Discharge

## 2020-12-01 NOTE — Telephone Encounter (Signed)
Andrea [name], daughter of Kienna, wanted to say "thank you and thank you again" for getting her mom into the network of doctors she needed.   She is very appreciative of you and wanted you to know how much that meant to her.

## 2020-12-01 NOTE — Consult Note (Signed)
[facility] Nurse Consult Note: Reason for Consult: NPWT resumption of care consult placed Friday evening. No NPWT placed over weekend. Patient is for transfer today to SNF facility and [facility] RN D. Belenda Cruise informs me this morning that the facility is not sure at what time they will receive their NPWT unit.  Since NPWT cannot be disconnected for greater than 2 hours with a dressing in place, she is requesting that patient transfer to [facility] with a normal saline dressing in place so that discharge is not delayed.  The facility will place the NPWT dressing to the left heel wound upon her arrival. Wound type: Infectious, pressure.  S/p partial calcanectomy with placement of antibiotic beads on 11/20/20. Pressure Injury POA: Yes Dressing procedure/placement/frequency: I have provided Nursing with guidance for placement of a NS dampened dressing over the wound and to change this twice daily until the NPWT device is obtained and therapy can be initiated at [facility] facility.  [place] nursing team will not follow, but will remain available to this patient, the nursing and medical teams.  Please re-consult if needed. Thanks, Maudie Flakes, MSN, RN, [place], Arther Abbott  Pager# [phone]

## 2020-12-01 NOTE — Telephone Encounter (Signed)
Schedules are completely full this week for providers, can you look on your schedule on Epic for Wed and Thursday and let me know what time would be a good time to fit in patient and I will let the front know so your schedule can be double booked. Thanks. KW

## 2020-12-02 DIAGNOSIS — E1122 Type 2 diabetes mellitus with diabetic chronic kidney disease: Secondary | ICD-10-CM | POA: Diagnosis not present

## 2020-12-02 DIAGNOSIS — E785 Hyperlipidemia, unspecified: Secondary | ICD-10-CM | POA: Diagnosis not present

## 2020-12-02 DIAGNOSIS — M8610 Other acute osteomyelitis, unspecified site: Secondary | ICD-10-CM | POA: Diagnosis not present

## 2020-12-02 DIAGNOSIS — I130 Hypertensive heart and chronic kidney disease with heart failure and stage 1 through stage 4 chronic kidney disease, or unspecified chronic kidney disease: Secondary | ICD-10-CM | POA: Diagnosis not present

## 2020-12-02 DIAGNOSIS — E1152 Type 2 diabetes mellitus with diabetic peripheral angiopathy with gangrene: Secondary | ICD-10-CM | POA: Diagnosis not present

## 2020-12-04 ENCOUNTER — Inpatient Hospital Stay: Payer: Medicare Other | Admitting: Family Medicine

## 2020-12-04 DIAGNOSIS — M86179 Other acute osteomyelitis, unspecified ankle and foot: Secondary | ICD-10-CM | POA: Diagnosis not present

## 2020-12-04 DIAGNOSIS — E1122 Type 2 diabetes mellitus with diabetic chronic kidney disease: Secondary | ICD-10-CM | POA: Diagnosis not present

## 2020-12-04 DIAGNOSIS — I5030 Unspecified diastolic (congestive) heart failure: Secondary | ICD-10-CM | POA: Diagnosis not present

## 2020-12-04 DIAGNOSIS — R600 Localized edema: Secondary | ICD-10-CM | POA: Diagnosis not present

## 2020-12-05 DIAGNOSIS — E782 Mixed hyperlipidemia: Secondary | ICD-10-CM | POA: Diagnosis not present

## 2020-12-05 DIAGNOSIS — I10 Essential (primary) hypertension: Secondary | ICD-10-CM | POA: Diagnosis not present

## 2020-12-05 DIAGNOSIS — Z794 Long term (current) use of insulin: Secondary | ICD-10-CM

## 2020-12-05 DIAGNOSIS — E113293 Type 2 diabetes mellitus with mild nonproliferative diabetic retinopathy without macular edema, bilateral: Secondary | ICD-10-CM | POA: Diagnosis not present

## 2020-12-08 DIAGNOSIS — R600 Localized edema: Secondary | ICD-10-CM | POA: Diagnosis not present

## 2020-12-08 DIAGNOSIS — I5030 Unspecified diastolic (congestive) heart failure: Secondary | ICD-10-CM | POA: Diagnosis not present

## 2020-12-08 DIAGNOSIS — M86179 Other acute osteomyelitis, unspecified ankle and foot: Secondary | ICD-10-CM | POA: Diagnosis not present

## 2020-12-08 DIAGNOSIS — E1122 Type 2 diabetes mellitus with diabetic chronic kidney disease: Secondary | ICD-10-CM | POA: Diagnosis not present

## 2020-12-08 DIAGNOSIS — F32A Depression, unspecified: Secondary | ICD-10-CM | POA: Diagnosis not present

## 2020-12-09 ENCOUNTER — Ambulatory Visit: Payer: Medicare Other | Admitting: Cardiovascular Disease

## 2020-12-10 ENCOUNTER — Telehealth: Payer: Self-pay | Admitting: *Deleted

## 2020-12-10 NOTE — Telephone Encounter (Signed)
"  She's at our facility.  She's seen by Dr. Amalia Hailey.  He's ordered a wound vac.  I have some concerns.  Yesterday when I changed the wound vac, she's having very minimal output.  She is starting to get a little maceration.  When I put wound vac back on, I did strip and protect that skin.  I called yesterday and left a message.  I'm in and out of rooms so you can reach me on my cell which is [phone]."

## 2020-12-11 DIAGNOSIS — E1122 Type 2 diabetes mellitus with diabetic chronic kidney disease: Secondary | ICD-10-CM | POA: Diagnosis not present

## 2020-12-11 DIAGNOSIS — M86179 Other acute osteomyelitis, unspecified ankle and foot: Secondary | ICD-10-CM | POA: Diagnosis not present

## 2020-12-11 DIAGNOSIS — I5030 Unspecified diastolic (congestive) heart failure: Secondary | ICD-10-CM | POA: Diagnosis not present

## 2020-12-11 DIAGNOSIS — I130 Hypertensive heart and chronic kidney disease with heart failure and stage 1 through stage 4 chronic kidney disease, or unspecified chronic kidney disease: Secondary | ICD-10-CM | POA: Diagnosis not present

## 2020-12-12 ENCOUNTER — Ambulatory Visit (INDEPENDENT_AMBULATORY_CARE_PROVIDER_SITE_OTHER): Payer: Medicare Other | Admitting: Podiatry

## 2020-12-12 ENCOUNTER — Encounter: Payer: Self-pay | Admitting: Podiatry

## 2020-12-12 ENCOUNTER — Other Ambulatory Visit: Payer: Self-pay

## 2020-12-12 DIAGNOSIS — L89620 Pressure ulcer of left heel, unstageable: Secondary | ICD-10-CM

## 2020-12-12 DIAGNOSIS — E113293 Type 2 diabetes mellitus with mild nonproliferative diabetic retinopathy without macular edema, bilateral: Secondary | ICD-10-CM

## 2020-12-12 DIAGNOSIS — Z794 Long term (current) use of insulin: Secondary | ICD-10-CM

## 2020-12-12 MED ORDER — OXYCODONE-ACETAMINOPHEN 5-325 MG PO TABS
1.0000 | ORAL_TABLET | ORAL | 0 refills | Status: DC | PRN
Start: 2020-12-12 — End: 2020-12-22

## 2020-12-15 DIAGNOSIS — I5030 Unspecified diastolic (congestive) heart failure: Secondary | ICD-10-CM | POA: Diagnosis not present

## 2020-12-15 DIAGNOSIS — I130 Hypertensive heart and chronic kidney disease with heart failure and stage 1 through stage 4 chronic kidney disease, or unspecified chronic kidney disease: Secondary | ICD-10-CM | POA: Diagnosis not present

## 2020-12-15 DIAGNOSIS — E1122 Type 2 diabetes mellitus with diabetic chronic kidney disease: Secondary | ICD-10-CM | POA: Diagnosis not present

## 2020-12-15 DIAGNOSIS — M86179 Other acute osteomyelitis, unspecified ankle and foot: Secondary | ICD-10-CM | POA: Diagnosis not present

## 2020-12-15 NOTE — Telephone Encounter (Signed)
I called and spoke to [place].  I informed her that Dr. Amalia Hailey said to reapply the wound vac today.

## 2020-12-15 NOTE — Telephone Encounter (Signed)
Please have nurse reapply the wound vac today. - Dr. Amalia Hailey

## 2020-12-16 ENCOUNTER — Ambulatory Visit: Payer: Medicare Other | Attending: Infectious Diseases | Admitting: Infectious Diseases

## 2020-12-16 ENCOUNTER — Other Ambulatory Visit: Payer: Self-pay

## 2020-12-16 VITALS — BP 100/54 | HR 105 | Temp 97.2°F | Resp 16 | Ht 60.0 in | Wt 138.0 lb

## 2020-12-16 DIAGNOSIS — R630 Anorexia: Secondary | ICD-10-CM | POA: Diagnosis not present

## 2020-12-16 DIAGNOSIS — R110 Nausea: Secondary | ICD-10-CM | POA: Diagnosis not present

## 2020-12-16 DIAGNOSIS — L089 Local infection of the skin and subcutaneous tissue, unspecified: Secondary | ICD-10-CM | POA: Diagnosis not present

## 2020-12-16 DIAGNOSIS — Z96642 Presence of left artificial hip joint: Secondary | ICD-10-CM | POA: Diagnosis not present

## 2020-12-16 DIAGNOSIS — Z8249 Family history of ischemic heart disease and other diseases of the circulatory system: Secondary | ICD-10-CM | POA: Diagnosis not present

## 2020-12-16 DIAGNOSIS — J449 Chronic obstructive pulmonary disease, unspecified: Secondary | ICD-10-CM | POA: Insufficient documentation

## 2020-12-16 DIAGNOSIS — I110 Hypertensive heart disease with heart failure: Secondary | ICD-10-CM | POA: Diagnosis not present

## 2020-12-16 DIAGNOSIS — E11319 Type 2 diabetes mellitus with unspecified diabetic retinopathy without macular edema: Secondary | ICD-10-CM | POA: Diagnosis not present

## 2020-12-16 DIAGNOSIS — Z87891 Personal history of nicotine dependence: Secondary | ICD-10-CM | POA: Diagnosis not present

## 2020-12-16 DIAGNOSIS — L97429 Non-pressure chronic ulcer of left heel and midfoot with unspecified severity: Secondary | ICD-10-CM | POA: Diagnosis not present

## 2020-12-16 DIAGNOSIS — I252 Old myocardial infarction: Secondary | ICD-10-CM | POA: Diagnosis not present

## 2020-12-16 DIAGNOSIS — I2510 Atherosclerotic heart disease of native coronary artery without angina pectoris: Secondary | ICD-10-CM | POA: Insufficient documentation

## 2020-12-16 DIAGNOSIS — I5022 Chronic systolic (congestive) heart failure: Secondary | ICD-10-CM | POA: Insufficient documentation

## 2020-12-16 DIAGNOSIS — E11621 Type 2 diabetes mellitus with foot ulcer: Secondary | ICD-10-CM | POA: Diagnosis not present

## 2020-12-16 DIAGNOSIS — Z8349 Family history of other endocrine, nutritional and metabolic diseases: Secondary | ICD-10-CM | POA: Insufficient documentation

## 2020-12-16 DIAGNOSIS — Z79899 Other long term (current) drug therapy: Secondary | ICD-10-CM | POA: Insufficient documentation

## 2020-12-16 DIAGNOSIS — Z833 Family history of diabetes mellitus: Secondary | ICD-10-CM | POA: Insufficient documentation

## 2020-12-16 DIAGNOSIS — Z8673 Personal history of transient ischemic attack (TIA), and cerebral infarction without residual deficits: Secondary | ICD-10-CM | POA: Diagnosis not present

## 2020-12-16 DIAGNOSIS — Z7985 Long-term (current) use of injectable non-insulin antidiabetic drugs: Secondary | ICD-10-CM | POA: Diagnosis not present

## 2020-12-16 DIAGNOSIS — Z882 Allergy status to sulfonamides status: Secondary | ICD-10-CM | POA: Diagnosis not present

## 2020-12-16 DIAGNOSIS — E785 Hyperlipidemia, unspecified: Secondary | ICD-10-CM | POA: Insufficient documentation

## 2020-12-16 DIAGNOSIS — Z6826 Body mass index (BMI) 26.0-26.9, adult: Secondary | ICD-10-CM | POA: Diagnosis not present

## 2020-12-16 DIAGNOSIS — Z8661 Personal history of infections of the central nervous system: Secondary | ICD-10-CM | POA: Insufficient documentation

## 2020-12-16 DIAGNOSIS — Z888 Allergy status to other drugs, medicaments and biological substances status: Secondary | ICD-10-CM | POA: Diagnosis not present

## 2020-12-16 DIAGNOSIS — M869 Osteomyelitis, unspecified: Secondary | ICD-10-CM | POA: Insufficient documentation

## 2020-12-16 NOTE — Patient Instructions (Addendum)
You are here for the follow-up of the left foot infection/wound.  You have been on daptomycin and cefepime for a total of 6 weeks which will complete on 12/28/2020. Today and I reviewed the labs from 12/12/2020 your creatinine was slightly increased at 1.29, eosinophil count is borderline elevated at 8.3 normal is up to 7.  Hemoglobin is 10.9. He has not been eating or drinking well.  We need to consume at least 8 glasses of water a day.  He to increase your protein intake for the wound to heal.  We will complete antibiotics on 12/28/2020 and the PICC line can be removed after that.  We will follow-up in 3 weeks. You need to increase your protein intake.  If you are not eating you may want to consume Glucerna.  Please check with [facility] for Glucerna.

## 2020-12-16 NOTE — Progress Notes (Signed)
NAME: Andrea [name]  DOB: 1946-02-08  MRN: [id number]  Date/Time: 12/16/2020 11:41 AM   Subjective:   Follow-up after recent hospitalization for a left heel wound.  Daughter is with the patient Andrea [name] is a 75 y.o. female with a history of diabetes mellitus, recent left hip replacement, hypertension, lipidemia, CAD, TIA, history of CSF rhinorrhea, strep pneumo meningitis in September 2021 followed by surgery to correct the CSF rhinorrhea was recently in the hospital for left heel ulcer and osteomyelitis.  She was admitted to [facility] between 11/17/2020 until 12/01/2020. As per patient she had left hip replacement on 08/19/2020.  The left heel was cut by the boot she had on in the OR.  She went to rehab a following that nd the wound did not heal well.  She was seeing podiatrist as outpatient. She was on doxycycline and Keflex.  She got readmitted to the hospital on 11/17/2020 and underwent debridement of the ulcer with partial calcanectomy and wound VAC placement.  The bone culture was positive for Pseudomonas which was pansensitive.  No bone tissue was sent for histopathological examination.   She was discharged on cefepime and daptomycin to complete 6 weeks of treatment which will end on 12/28/2020. She saw Dr. Amalia Hailey last week.  She has a wound VAC in place. Patient complains of poor appetite.  2 reasons 1 is the food at [facility] is not good and also she does not feel like eating. She had some nausea yesterday Her last labs from [facility] shows a creatinine of 1.29 from 12/12/2020 and previously it was 1.09 on 12/09/2020.  Her CBC is WBC of 5.9 with 7.5% eosinophils which later went up to 8.  Sedimentation rate is 24 on 930 and on 10 7 it is 48.  CK is normal.     Past Medical History:  Diagnosis Date   Anxiety    Arthritis    CAD (coronary artery disease)    Chickenpox    Chronic systolic heart failure (HCC)    COPD (chronic obstructive pulmonary disease) (HCC)    mild-no inhalers   Coronary  artery disease    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   Depression    Diabetes mellitus type 2, uncomplicated ([place])    Diabetic retinopathy ([place])    Dupuytren contracture 2011   s/p surgery to LEFT hand   Frequent urinary tract infections    GERD (gastroesophageal reflux disease)    Grade I diastolic dysfunction    HTN (hypertension)    Hyperlipidemia, unspecified    Irritable bowel syndrome    Meningitis 11/2019   MI (myocardial infarction) ([place]) 1994   no stent   Neuromuscular disorder (HCC)    nerve pain in back and legs   Osteoporosis    Pneumonia 2021   RBBB    Sepsis ([place]) 11/2019   Skin cancer    TIA (transient ischemic attack) 2014   Vitamin D deficiency, unspecified    Wheezing     Past Surgical History:  Procedure Laterality Date   BACK SURGERY     lower due to spinal stenosis   BONE BIOPSY Left 11/20/2020   Procedure: BONE BIOPSY;  Surgeon: Edrick Kins, DPM;  Location: [facility];  Service: Podiatry;  Laterality: Left;   CARDIAC CATHETERIZATION  1994   CATARACT EXTRACTION, BILATERAL Bilateral    COLONOSCOPY     x3   COLONOSCOPY WITH PROPOFOL N/A 02/08/2019   Procedure: COLONOSCOPY WITH PROPOFOL;  Surgeon: [name], MD;  Location: [facility];  Service: Gastroenterology;  Laterality: N/A;   CORONARY ANGIOPLASTY  1994   DUPUYTREN CONTRACTURE RELEASE Left 2011   HARDWARE REMOVAL Left 08/19/2020   Procedure: HARDWARE REMOVAL;  Surgeon: Hessie Knows, MD;  Location: [facility];  Service: Orthopedics;  Laterality: Left;   HIP FRACTURE SURGERY  11/2013   IRRIGATION AND DEBRIDEMENT FOOT Left 11/20/2020   Procedure: IRRIGATION AND DEBRIDEMENT FOOT-Heel Ulcer;  Surgeon: Edrick Kins, DPM;  Location: [facility];  Service: Podiatry;  Laterality: Left;   PR COLSC FLX W/REMOVAL LESION BY HOT BX FORCEPS   08/21/2015   Procedure: COLONOSCOPY, FLEXIBLE, PROXIMAL TO SPLENIC FLEXURE; W/REMOVAL TUMOR/POLYP/OTHER LESION, HOT BX FORCEP/CAUTE; Surgeon: Carlena Hurl, MD;  Location: [facility]; Service: General Surgery   REPAIR DURAL / CSF LEAK     to left sinus   TEE WITHOUT CARDIOVERSION N/A 11/20/2019   Procedure: TRANSESOPHAGEAL ECHOCARDIOGRAM (TEE);  Surgeon: Nelva Bush, MD;  Location: [facility];  Service: Cardiovascular;  Laterality: N/A;   TOTAL HIP ARTHROPLASTY Left 08/19/2020   Procedure: TOTAL HIP ARTHROPLASTY ANTERIOR APPROACH;  Surgeon: Hessie Knows, MD;  Location: [facility];  Service: Orthopedics;  Laterality: Left;    Social History   Socioeconomic History   Marital status: Married    Spouse name: Roselie Awkward   Number of children: 3   Years of education: 8   Highest education level: 8th grade  Occupational History   Occupation: retired  Tobacco Use   Smoking status: Former    Packs/day: 1.00    Years: 30.00    Pack years: 30.00    Types: Cigarettes    Quit date: 07/17/2012    Years since quitting: 8.4   Smokeless tobacco: Never   Tobacco comments:       Vaping Use   Vaping Use: Never used  Substance and Sexual Activity   Alcohol use: Not Currently    Alcohol/week: 0.0 standard drinks   Drug use: No   Sexual activity: Not Currently  Other Topics Concern   Not on file  Social History Narrative   Not on file   Social Determinants of Health   Financial Resource Strain: Not on file  Food Insecurity: No Food Insecurity   Worried About Charity fundraiser in the Last Year: Never true   Ran Out of Food in the Last Year: Never true  Transportation Needs: Unknown   Lack of Transportation (Medical): No   Lack of Transportation (Non-Medical): Not on file  Physical Activity: Not on file  Stress: Not on file  Social Connections: Not on file  Intimate Partner Violence: Not on file    Family History  Problem Relation Age of Onset   Depression Daughter    Arthritis Daughter        spine   Plantar fasciitis Daughter    Anxiety disorder Daughter    Hyperlipidemia Daughter    AAA (abdominal aortic aneurysm) Daughter     Hypertension Mother    Stroke Mother    Cataracts Mother    Depression Mother    Heart disease Mother    Asthma Brother    Diabetes Daughter    Hyperlipidemia Daughter    Hypertension Daughter    Breast cancer Cousin        maternal   Allergies  Allergen Reactions   Metformin And Related Diarrhea   Sulfa Antibiotics Rash   I? Current Outpatient Medications  Medication Sig Dispense Refill   Alcohol Swabs (ALCOHOL PREP) PADS Use twice daily prior  to SQ injection of insulin to clean skin 100 each 6   atorvastatin (LIPITOR) 20 MG tablet Take 1 tablet (20 mg total) by mouth daily.     Barberry-Oreg Grape-Goldenseal (BERBERINE COMPLEX PO) Take 1 tablet by mouth 2 (two) times daily with a meal.     Calcium Carbonate-Vitamin D 600-400 MG-UNIT tablet Take 1 tablet by mouth 2 (two) times daily.     ceFEPime (MAXIPIME) IVPB Inject 2 g into the vein every 12 (twelve) hours. Indication:  Left heel ulcer with osteomyelitis First Dose: Yes Last Day of Therapy:  12/28/2020 Labs - Once weekly:  CBC/D, CMP, ESR, CK Method of administration: IV Push Method of administration may be changed at the discretion of home infusion pharmacist based upon assessment of the patient and/or caregiver's ability to self-administer the medication ordered. 66 Units 0   collagenase (SANTYL) ointment Apply 1 application topically daily. 15 g 0   Cyanocobalamin (B-12) 1000 MCG CAPS Take 1,000 mcg by mouth daily.      daptomycin (CUBICIN) IVPB Inject 400 mg into the vein daily. Indication:  Left heel ulcer with osteomyelitis First Dose: Yes Last Day of Therapy:  12/28/2020 Labs - Once weekly:  CBC/D, CMP, CK, ESR Method of administration: IV Push Method of administration may be changed at the discretion of home infusion pharmacist based upon assessment of the patient and/or caregiver's ability to self-administer the medication ordered. 33 Units 0   Insulin Pen Needle (TRUEPLUS PEN NEEDLES) 31G X 6 MM MISC Use with  lantus two times daily. 100 each 3   INSULIN SYRINGE 1CC/29G 29G X 1/2" 1 ML MISC For lantus injections twice daily 200 each 1   LANTUS SOLOSTAR 100 UNIT/ML Solostar Pen Inject 20 Units into the skin at bedtime. 15 mL 3   losartan (COZAAR) 25 MG tablet TAKE 1 TABLET BY MOUTH EVERY DAY 90 tablet 0   metoprolol succinate (TOPROL-XL) 25 MG 24 hr tablet TAKE 1 TABLET BY MOUTH EVERY DAY 90 tablet 3   Multiple Vitamins-Minerals (PRESERVISION AREDS 2 PO) Take 1 tablet by mouth in the morning and at bedtime.     Omega-3 1000 MG CAPS Take 1,000 mg by mouth daily.     omeprazole (PRILOSEC) 40 MG capsule TAKE 1 CAPSULE BY MOUTH EVERY DAY 90 capsule 0   OneTouch Delica Lancets 16S MISC To check blood sugar daily  DX: E11.9 100 each 5   ONETOUCH VERIO test strip TO CHECK BLOOD SUGAR ONCE DAILY. 100 strip 11   oxybutynin (DITROPAN-XL) 10 MG 24 hr tablet TAKE 1 TABLET BY MOUTH EVERYDAY AT BEDTIME 90 tablet 0   oxyCODONE-acetaminophen (PERCOCET) 5-325 MG tablet Take 1 tablet by mouth every 4 (four) hours as needed for severe pain. 30 tablet 0   potassium chloride (KLOR-CON) 10 MEQ tablet Take 1 tablet (10 mEq total) by mouth daily. 90 tablet 1   TRULICITY 1.5 AY/3.0ZS SOPN INJECT 1.5MG INTO THE SKIN ONCE A WEEK AS DIRECTED 2 mL 2   Vitamin D, Ergocalciferol, (DRISDOL) 1.25 MG (50000 UNIT) CAPS capsule TAKE ONE CAPSULE BY MOUTH ONCE WEEKLY ON THE SAME DAY EACH WEEK (ON FRIDAYS) 12 capsule 1   furosemide (LASIX) 20 MG tablet Take 1 tablet (20 mg total) by mouth daily. (Patient not taking: Reported on 12/16/2020) 90 tablet 1   No current facility-administered medications for this visit.     Abtx:  Anti-infectives (From admission, onward)    None       REVIEW OF SYSTEMS:  Const:  negative fever, negative chills, some weight loss, poor appetite bruise Eyes: negative diplopia or visual changes, negative eye pain ENT: negative coryza, negative sore throat Resp: negative cough, hemoptysis, dyspnea Cards:  negative for chest pain, palpitations, lower extremity edema GU: negative for frequency, dysuria and hematuria GI: Negative for abdominal pain, diarrhea, bleeding, constipation Skin: negative for rash and pruritus Heme: Seeing over her left shin area and a small wound on the right great toe likely from stubbing her foot MS: General weakness. Neurolo:negative for headaches, dizziness, vertigo, memory problems  Psych: negative for feelings of anxiety, depression  Endocrine: negative for thyroid, diabetes Allergy/Immunology-metformin and sulfa Objective:  VITALS:  BP (!) 100/54   Pulse (!) 105   Temp (!) 97.2 F (36.2 C)   Resp 16   Ht 5' (1.524 m)   Wt 138 lb (62.6 kg)   SpO2 98%   BMI 26.95 kg/m  PHYSICAL EXAM:  General: Alert, cooperative, no distress, appears stated age.  Head: Normocephalic, without obvious abnormality, atraumatic. Eyes: Conjunctivae clear, anicteric sclerae. Pupils are equal Lungs: Clear to auscultation bilaterally. No Wheezing or Rhonchi. No rales. Heart: Regular rate and rhythm, no murmur, rub or gallop. Abdomen: Soft, non-tender,not distended. Bowel sounds normal. No masses Extremities: Left heel has a wound VAC.  I reviewed the pictures taken by Dr. Amalia Hailey 12/12/20   11/26/20   11/17/20   atraumatic, no cyanosis. No edema. No clubbing Skin: No rashes or lesions. Or bruising Lymph: Cervical, supraclavicular normal. Neurologic: Grossly non-focal Pertinent Labs Lab Results CBC 12/12/2020 WBC 7.1, hemoglobin 10.7, platelet 181.  Eosinophil count is borderline high at 8.3 normal is up to 7.  Creatinine kinase is 66.  Blood sugar is 152, creatinine 1.29, sodium 141, potassium 4.4, alkaline phosphatase 153, albumin 3.1.  ESR is 24  ? Impression/Recommendation Left heel ulcer infected wound.  Status post debridement and partial calcanectomy.  Has a wound VAC.  Culture from the bone was Pseudomonas.  Bone was not sent for histology.  Patient before surgery  was on Doxy and Keflex as outpatient and hence we covered her both for gram-negative and gram-positive organisms including MRSA.  She is currently on daptomycin and cefepime which she will complete on 12/28/2020.  This will complete 6 weeks of antibiotic. Her creatinine is borderline high and eosinophilic count is also borderline increased at 8.  These 2 labs were from 12/12/2020.  Keep a close eye on it and she will have a repeat labs on 12/19/2020.  Depending on that labs refer eosinophilic count is increasing he can stop the daptomycin. CK is normal.  The dose of atorvastatin has been reduced to 20 mg from 80 while she is on daptomycin.  Diabetes mellitus on insulin  Hypertension on losartan  Patient has a follow-up with Dr. Amalia Hailey on 12/30/2020.  She will complete 6 weeks of antibiotics on 12/28/2020 and the PICC line can be removed after that.  Will discuss with Dr. Amalia Hailey regarding any p.o. medication following that. I will be away October 12 until October 24 and Dr. Ola Spurr will be covering me.  Inform patient the daughter about that. ? ___________________________________________________  Note:  This document was prepared using Dragon voice recognition software and may include unintentional dictation errors.

## 2020-12-17 NOTE — Progress Notes (Signed)
   Subjective:  Patient presents today status post partial posterior calcanectomy with placement of antibiotic beads and application of wound graft left.  DOS: 11/20/2020.  Patient continues to have some pain and tenderness to the posterior heel.  She presents today wearing her wound VAC.  She is also concomitantly being managed with infectious disease for IV antibiotics.  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   Depression    Diabetes mellitus type 2, uncomplicated ([place])    Diabetic retinopathy ([place])    Dupuytren contracture 2011   s/p surgery to LEFT hand   Frequent urinary tract infections    GERD (gastroesophageal reflux disease)    Grade I diastolic dysfunction    HTN (hypertension)    Hyperlipidemia, unspecified    Irritable bowel syndrome    Meningitis 11/2019   MI (myocardial infarction) ([place]) 1994   no stent   Neuromuscular disorder ([place])    nerve pain in back and legs   Osteoporosis    Pneumonia 2021   RBBB    Sepsis ([place]) 11/2019   Skin cancer    TIA (transient ischemic attack) 2014   Vitamin D deficiency, unspecified    Wheezing        Objective/Physical Exam Neurovascular status intact.  No edema noted.  No erythema.  The posterior wound appears stable.  There is exposed OsteoSet absorbable antibiotic beads protruding from the wound site.  Please see attached photo above.  There is minimal maceration around the area.  Overall the wound appears healthy and much smaller.  The wound only measures about 2.0x2.0.   Assessment: 1. s/p partial calcanectomy with placement of antibiotic beads and application of wound VAC left.. DOS: 11/20/2020   Plan of Care:  1. Patient was evaluated. 2.   Light debridement was performed and the exposed OsteoSet antibiotic beads were identified and removed completely.  This came out as a single adhered lump.  I presume the antibiotic beads meshed together into a single large bead.  After removal the patient did feel some relief. 3.  Dry sterile dressings were applied.  Resume the wound VAC beginning Monday, 12/15/2020 at the outpatient care facility 4.  Continue management with infectious disease 5.  Refill prescription for Percocet 5/325 mg 6.  Return to clinic in 3 weeks   Edrick Kins, DPM [facility]  Dr. Edrick Kins, DPM    [address], [place] [address]                Office [phone]  Fax [phone]

## 2020-12-18 ENCOUNTER — Inpatient Hospital Stay
Admission: EM | Admit: 2020-12-18 | Discharge: 2020-12-22 | DRG: 637 | Disposition: A | Discharge status: Skilled Nursing Facility | Payer: Medicare Other | Source: Skilled Nursing Facility | Attending: Internal Medicine | Admitting: Internal Medicine

## 2020-12-18 ENCOUNTER — Emergency Department: Payer: Medicare Other

## 2020-12-18 ENCOUNTER — Inpatient Hospital Stay: Payer: Medicare Other

## 2020-12-18 ENCOUNTER — Encounter: Payer: Self-pay | Admitting: Emergency Medicine

## 2020-12-18 ENCOUNTER — Other Ambulatory Visit: Payer: Self-pay

## 2020-12-18 DIAGNOSIS — E1169 Type 2 diabetes mellitus with other specified complication: Principal | ICD-10-CM

## 2020-12-18 DIAGNOSIS — E559 Vitamin D deficiency, unspecified: Secondary | ICD-10-CM | POA: Diagnosis present

## 2020-12-18 DIAGNOSIS — F410 Panic disorder [episodic paroxysmal anxiety] without agoraphobia: Secondary | ICD-10-CM | POA: Diagnosis present

## 2020-12-18 DIAGNOSIS — I4510 Unspecified right bundle-branch block: Secondary | ICD-10-CM | POA: Diagnosis present

## 2020-12-18 DIAGNOSIS — G9341 Metabolic encephalopathy: Secondary | ICD-10-CM | POA: Diagnosis present

## 2020-12-18 DIAGNOSIS — J9811 Atelectasis: Secondary | ICD-10-CM | POA: Diagnosis not present

## 2020-12-18 DIAGNOSIS — Z8673 Personal history of transient ischemic attack (TIA), and cerebral infarction without residual deficits: Secondary | ICD-10-CM

## 2020-12-18 DIAGNOSIS — N3281 Overactive bladder: Secondary | ICD-10-CM | POA: Diagnosis not present

## 2020-12-18 DIAGNOSIS — M86172 Other acute osteomyelitis, left ankle and foot: Secondary | ICD-10-CM | POA: Diagnosis present

## 2020-12-18 DIAGNOSIS — E113293 Type 2 diabetes mellitus with mild nonproliferative diabetic retinopathy without macular edema, bilateral: Secondary | ICD-10-CM | POA: Diagnosis not present

## 2020-12-18 DIAGNOSIS — I5022 Chronic systolic (congestive) heart failure: Secondary | ICD-10-CM | POA: Diagnosis not present

## 2020-12-18 DIAGNOSIS — Z882 Allergy status to sulfonamides status: Secondary | ICD-10-CM

## 2020-12-18 DIAGNOSIS — F32A Depression, unspecified: Secondary | ICD-10-CM | POA: Diagnosis not present

## 2020-12-18 DIAGNOSIS — E1122 Type 2 diabetes mellitus with diabetic chronic kidney disease: Secondary | ICD-10-CM | POA: Diagnosis not present

## 2020-12-18 DIAGNOSIS — E785 Hyperlipidemia, unspecified: Secondary | ICD-10-CM | POA: Diagnosis present

## 2020-12-18 DIAGNOSIS — I499 Cardiac arrhythmia, unspecified: Secondary | ICD-10-CM | POA: Diagnosis not present

## 2020-12-18 DIAGNOSIS — Z825 Family history of asthma and other chronic lower respiratory diseases: Secondary | ICD-10-CM

## 2020-12-18 DIAGNOSIS — L97429 Non-pressure chronic ulcer of left heel and midfoot with unspecified severity: Secondary | ICD-10-CM | POA: Diagnosis present

## 2020-12-18 DIAGNOSIS — J449 Chronic obstructive pulmonary disease, unspecified: Secondary | ICD-10-CM | POA: Diagnosis present

## 2020-12-18 DIAGNOSIS — N179 Acute kidney failure, unspecified: Secondary | ICD-10-CM | POA: Diagnosis not present

## 2020-12-18 DIAGNOSIS — R000 Tachycardia, unspecified: Secondary | ICD-10-CM | POA: Diagnosis not present

## 2020-12-18 DIAGNOSIS — K219 Gastro-esophageal reflux disease without esophagitis: Secondary | ICD-10-CM | POA: Diagnosis present

## 2020-12-18 DIAGNOSIS — I452 Bifascicular block: Secondary | ICD-10-CM | POA: Diagnosis present

## 2020-12-18 DIAGNOSIS — R531 Weakness: Secondary | ICD-10-CM | POA: Diagnosis present

## 2020-12-18 DIAGNOSIS — E11621 Type 2 diabetes mellitus with foot ulcer: Secondary | ICD-10-CM | POA: Diagnosis present

## 2020-12-18 DIAGNOSIS — Z8701 Personal history of pneumonia (recurrent): Secondary | ICD-10-CM

## 2020-12-18 DIAGNOSIS — I252 Old myocardial infarction: Secondary | ICD-10-CM | POA: Diagnosis not present

## 2020-12-18 DIAGNOSIS — N1831 Chronic kidney disease, stage 3a: Secondary | ICD-10-CM | POA: Diagnosis not present

## 2020-12-18 DIAGNOSIS — R410 Disorientation, unspecified: Secondary | ICD-10-CM | POA: Diagnosis present

## 2020-12-18 DIAGNOSIS — Z87440 Personal history of urinary (tract) infections: Secondary | ICD-10-CM

## 2020-12-18 DIAGNOSIS — Z7401 Bed confinement status: Secondary | ICD-10-CM | POA: Diagnosis not present

## 2020-12-18 DIAGNOSIS — Z96642 Presence of left artificial hip joint: Secondary | ICD-10-CM | POA: Diagnosis present

## 2020-12-18 DIAGNOSIS — R404 Transient alteration of awareness: Secondary | ICD-10-CM | POA: Diagnosis not present

## 2020-12-18 DIAGNOSIS — I130 Hypertensive heart and chronic kidney disease with heart failure and stage 1 through stage 4 chronic kidney disease, or unspecified chronic kidney disease: Secondary | ICD-10-CM | POA: Diagnosis present

## 2020-12-18 DIAGNOSIS — M810 Age-related osteoporosis without current pathological fracture: Secondary | ICD-10-CM | POA: Diagnosis present

## 2020-12-18 DIAGNOSIS — I517 Cardiomegaly: Secondary | ICD-10-CM | POA: Diagnosis not present

## 2020-12-18 DIAGNOSIS — Z8619 Personal history of other infectious and parasitic diseases: Secondary | ICD-10-CM

## 2020-12-18 DIAGNOSIS — G253 Myoclonus: Secondary | ICD-10-CM | POA: Diagnosis present

## 2020-12-18 DIAGNOSIS — Z794 Long term (current) use of insulin: Secondary | ICD-10-CM

## 2020-12-18 DIAGNOSIS — N281 Cyst of kidney, acquired: Secondary | ICD-10-CM | POA: Diagnosis not present

## 2020-12-18 DIAGNOSIS — M1990 Unspecified osteoarthritis, unspecified site: Secondary | ICD-10-CM | POA: Diagnosis present

## 2020-12-18 DIAGNOSIS — N189 Chronic kidney disease, unspecified: Secondary | ICD-10-CM

## 2020-12-18 DIAGNOSIS — I10 Essential (primary) hypertension: Secondary | ICD-10-CM | POA: Diagnosis not present

## 2020-12-18 DIAGNOSIS — J811 Chronic pulmonary edema: Secondary | ICD-10-CM | POA: Diagnosis not present

## 2020-12-18 DIAGNOSIS — E876 Hypokalemia: Secondary | ICD-10-CM

## 2020-12-18 DIAGNOSIS — Z7985 Long-term (current) use of injectable non-insulin antidiabetic drugs: Secondary | ICD-10-CM

## 2020-12-18 DIAGNOSIS — R4182 Altered mental status, unspecified: Secondary | ICD-10-CM | POA: Diagnosis present

## 2020-12-18 DIAGNOSIS — R6889 Other general symptoms and signs: Secondary | ICD-10-CM | POA: Diagnosis not present

## 2020-12-18 DIAGNOSIS — N3001 Acute cystitis with hematuria: Secondary | ICD-10-CM | POA: Diagnosis present

## 2020-12-18 DIAGNOSIS — E119 Type 2 diabetes mellitus without complications: Secondary | ICD-10-CM | POA: Diagnosis not present

## 2020-12-18 DIAGNOSIS — Z8661 Personal history of infections of the central nervous system: Secondary | ICD-10-CM

## 2020-12-18 DIAGNOSIS — Z888 Allergy status to other drugs, medicaments and biological substances status: Secondary | ICD-10-CM

## 2020-12-18 DIAGNOSIS — Z818 Family history of other mental and behavioral disorders: Secondary | ICD-10-CM

## 2020-12-18 DIAGNOSIS — Z20822 Contact with and (suspected) exposure to covid-19: Secondary | ICD-10-CM | POA: Diagnosis not present

## 2020-12-18 DIAGNOSIS — Z79899 Other long term (current) drug therapy: Secondary | ICD-10-CM

## 2020-12-18 DIAGNOSIS — M6281 Muscle weakness (generalized): Secondary | ICD-10-CM | POA: Diagnosis not present

## 2020-12-18 DIAGNOSIS — Z8249 Family history of ischemic heart disease and other diseases of the circulatory system: Secondary | ICD-10-CM

## 2020-12-18 DIAGNOSIS — Z85828 Personal history of other malignant neoplasm of skin: Secondary | ICD-10-CM

## 2020-12-18 DIAGNOSIS — L03116 Cellulitis of left lower limb: Secondary | ICD-10-CM | POA: Diagnosis not present

## 2020-12-18 DIAGNOSIS — I2510 Atherosclerotic heart disease of native coronary artery without angina pectoris: Secondary | ICD-10-CM | POA: Diagnosis present

## 2020-12-18 DIAGNOSIS — R5381 Other malaise: Secondary | ICD-10-CM | POA: Diagnosis not present

## 2020-12-18 DIAGNOSIS — Z736 Limitation of activities due to disability: Secondary | ICD-10-CM | POA: Diagnosis not present

## 2020-12-18 DIAGNOSIS — E569 Vitamin deficiency, unspecified: Secondary | ICD-10-CM | POA: Diagnosis not present

## 2020-12-18 DIAGNOSIS — E162 Hypoglycemia, unspecified: Secondary | ICD-10-CM

## 2020-12-18 DIAGNOSIS — Z87891 Personal history of nicotine dependence: Secondary | ICD-10-CM

## 2020-12-18 DIAGNOSIS — R29818 Other symptoms and signs involving the nervous system: Secondary | ICD-10-CM | POA: Diagnosis not present

## 2020-12-18 DIAGNOSIS — K589 Irritable bowel syndrome without diarrhea: Secondary | ICD-10-CM | POA: Diagnosis present

## 2020-12-18 DIAGNOSIS — Z9861 Coronary angioplasty status: Secondary | ICD-10-CM

## 2020-12-18 DIAGNOSIS — Z823 Family history of stroke: Secondary | ICD-10-CM

## 2020-12-18 DIAGNOSIS — Z743 Need for continuous supervision: Secondary | ICD-10-CM | POA: Diagnosis not present

## 2020-12-18 LAB — TROPONIN I (HIGH SENSITIVITY)
Troponin I (High Sensitivity): 27 ng/L — ABNORMAL HIGH (ref <18)
Troponin I (High Sensitivity): 24 ng/L — ABNORMAL HIGH (ref <18)

## 2020-12-18 LAB — CBC WITH DIFFERENTIAL/PLATELET
Abs Immature Granulocytes: 0.03 10*3/uL (ref 0.00–0.07)
Basophils Absolute: 0.1 10*3/uL (ref 0.0–0.1)
Basophils Relative: 1 %
Eosinophils Absolute: 1 10*3/uL — ABNORMAL HIGH (ref 0.0–0.5)
Eosinophils Relative: 10 %
HCT: 35.1 % — ABNORMAL LOW (ref 36.0–46.0)
Hemoglobin: 11.4 g/dL — ABNORMAL LOW (ref 12.0–15.0)
Immature Granulocytes: 0 %
Lymphocytes Relative: 19 %
Lymphs Abs: 1.8 10*3/uL (ref 0.7–4.0)
MCH: 30 pg (ref 26.0–34.0)
MCHC: 32.5 g/dL (ref 30.0–36.0)
MCV: 92.4 fL (ref 80.0–100.0)
Monocytes Absolute: 0.8 10*3/uL (ref 0.1–1.0)
Monocytes Relative: 8 %
Neutro Abs: 5.9 10*3/uL (ref 1.7–7.7)
Neutrophils Relative %: 62 %
Platelets: 188 10*3/uL (ref 150–400)
RBC: 3.8 MIL/uL — ABNORMAL LOW (ref 3.87–5.11)
RDW: 17.3 % — ABNORMAL HIGH (ref 11.5–15.5)
WBC: 9.5 10*3/uL (ref 4.0–10.5)
nRBC: 0 % (ref 0.0–0.2)

## 2020-12-18 LAB — COMPREHENSIVE METABOLIC PANEL
ALT: 16 U/L (ref 0–44)
AST: 17 U/L (ref 15–41)
Albumin: 2.6 g/dL — ABNORMAL LOW (ref 3.5–5.0)
Alkaline Phosphatase: 130 U/L — ABNORMAL HIGH (ref 38–126)
Anion gap: 8 (ref 5–15)
BUN: 55 mg/dL — ABNORMAL HIGH (ref 8–23)
CO2: 24 mmol/L (ref 22–32)
Calcium: 9.1 mg/dL (ref 8.9–10.3)
Chloride: 106 mmol/L (ref 98–111)
Creatinine, Ser: 2.92 mg/dL — ABNORMAL HIGH (ref 0.44–1.00)
GFR, Estimated: 16 mL/min — ABNORMAL LOW (ref >60)
Glucose, Bld: 120 mg/dL — ABNORMAL HIGH (ref 70–99)
Potassium: 4.5 mmol/L (ref 3.5–5.1)
Sodium: 138 mmol/L (ref 135–145)
Total Bilirubin: 0.8 mg/dL (ref 0.3–1.2)
Total Protein: 6.3 g/dL — ABNORMAL LOW (ref 6.5–8.1)

## 2020-12-18 LAB — CK: Total CK: 57 U/L (ref 38–234)

## 2020-12-18 LAB — LACTIC ACID, PLASMA
Lactic Acid, Venous: 0.9 mmol/L (ref 0.5–1.9)
Lactic Acid, Venous: 1 mmol/L (ref 0.5–1.9)

## 2020-12-18 LAB — URINALYSIS, COMPLETE (UACMP) WITH MICROSCOPIC
Bacteria, UA: NONE SEEN
Bilirubin Urine: NEGATIVE
Glucose, UA: NEGATIVE mg/dL
Hgb urine dipstick: NEGATIVE
Ketones, ur: 5 mg/dL — AB
Nitrite: NEGATIVE
Protein, ur: 100 mg/dL — AB
Specific Gravity, Urine: 1.01 (ref 1.005–1.030)
WBC, UA: >50 WBC/hpf — ABNORMAL HIGH (ref 0–5)
pH: 6 (ref 5.0–8.0)

## 2020-12-18 LAB — CBG MONITORING, ED: Glucose-Capillary: 89 mg/dL (ref 70–99)

## 2020-12-18 LAB — RESP PANEL BY RT-PCR (FLU A&B, COVID) ARPGX2
Influenza A by PCR: NEGATIVE
Influenza B by PCR: NEGATIVE
SARS Coronavirus 2 by RT PCR: NEGATIVE

## 2020-12-18 MED ORDER — PANTOPRAZOLE SODIUM 40 MG PO TBEC: 40.0000 mg | DELAYED_RELEASE_TABLET | Freq: Every day | ORAL | Status: DC

## 2020-12-18 MED ORDER — SODIUM CHLORIDE 0.9 % IV SOLN: 2.0000 g | INTRAVENOUS | Status: DC

## 2020-12-18 MED ORDER — OMEGA-3-ACID ETHYL ESTERS 1 G PO CAPS
1000.0000 mg | ORAL_CAPSULE | Freq: Every day | ORAL | Status: DC
  Filled 2020-12-18: qty 1

## 2020-12-18 MED ORDER — SODIUM CHLORIDE 0.9 % IV SOLN
1.0000 g | Freq: Two times a day (BID) | INTRAVENOUS | Status: DC
  Administered 2020-12-18 – 2020-12-21 (×7): 1 g via INTRAVENOUS
  Filled 2020-12-18 (×9): qty 1

## 2020-12-18 MED ORDER — VITAMIN B-12 1000 MCG PO TABS
1000.0000 ug | ORAL_TABLET | Freq: Every day | ORAL | Status: DC
  Filled 2020-12-18: qty 1

## 2020-12-18 MED ORDER — CALCIUM CARBONATE-VITAMIN D 500-200 MG-UNIT PO TABS: 1.0000 | ORAL_TABLET | Freq: Two times a day (BID) | ORAL | Status: DC

## 2020-12-18 MED ORDER — ATORVASTATIN CALCIUM 20 MG PO TABS: 20.0000 mg | ORAL_TABLET | Freq: Every day | ORAL | Status: DC

## 2020-12-18 MED ORDER — LACTATED RINGERS IV BOLUS
1000.0000 mL | Freq: Once | INTRAVENOUS | Status: AC
  Administered 2020-12-18: 1000 mL via INTRAVENOUS

## 2020-12-18 MED ORDER — CEFEPIME IV (FOR PTA / DISCHARGE USE ONLY): 2.0000 g | Freq: Two times a day (BID) | INTRAVENOUS | Status: DC

## 2020-12-18 MED ORDER — ONDANSETRON HCL 4 MG/2ML IJ SOLN: 4.0000 mg | Freq: Four times a day (QID) | INTRAMUSCULAR | Status: DC | PRN

## 2020-12-18 MED ORDER — ACETAMINOPHEN 325 MG PO TABS
650.0000 mg | ORAL_TABLET | Freq: Four times a day (QID) | ORAL | Status: DC | PRN
  Administered 2020-12-21 – 2020-12-22 (×3): 650 mg via ORAL
  Filled 2020-12-18 (×3): qty 2

## 2020-12-18 MED ORDER — ZINC SULFATE 220 (50 ZN) MG PO CAPS: 220.0000 mg | ORAL_CAPSULE | Freq: Every day | ORAL | Status: DC

## 2020-12-18 MED ORDER — SERTRALINE HCL 50 MG PO TABS
25.0000 mg | ORAL_TABLET | Freq: Every day | ORAL | Status: DC
  Administered 2020-12-21 – 2020-12-22 (×2): 25 mg via ORAL
  Filled 2020-12-18 (×2): qty 1

## 2020-12-18 MED ORDER — ACETAMINOPHEN 650 MG RE SUPP: 650.0000 mg | Freq: Four times a day (QID) | RECTAL | Status: DC | PRN

## 2020-12-18 MED ORDER — ONDANSETRON HCL 4 MG PO TABS: 4.0000 mg | ORAL_TABLET | Freq: Four times a day (QID) | ORAL | Status: DC | PRN

## 2020-12-18 MED ORDER — DAPTOMYCIN IV (FOR PTA / DISCHARGE USE ONLY): 400.0000 mg | INTRAVENOUS | Status: DC

## 2020-12-18 MED ORDER — METOPROLOL SUCCINATE ER 25 MG PO TB24: 25.0000 mg | ORAL_TABLET | Freq: Every day | ORAL | Status: DC

## 2020-12-18 MED ORDER — ASCORBIC ACID 500 MG PO TABS: 500.0000 mg | ORAL_TABLET | Freq: Two times a day (BID) | ORAL | Status: DC

## 2020-12-18 MED ORDER — OCUVITE-LUTEIN PO CAPS
1.0000 | ORAL_CAPSULE | Freq: Two times a day (BID) | ORAL | Status: DC
  Administered 2020-12-20 – 2020-12-22 (×4): 1 via ORAL
  Filled 2020-12-18 (×10): qty 1

## 2020-12-18 MED ORDER — VITAMIN D (ERGOCALCIFEROL) 1.25 MG (50000 UNIT) PO CAPS
50000.0000 [IU] | ORAL_CAPSULE | ORAL | Status: DC
  Filled 2020-12-18: qty 1

## 2020-12-18 MED ORDER — HEPARIN SODIUM (PORCINE) 5000 UNIT/ML IJ SOLN
5000.0000 [IU] | Freq: Three times a day (TID) | INTRAMUSCULAR | Status: DC
  Administered 2020-12-19 – 2020-12-22 (×12): 5000 [IU] via SUBCUTANEOUS
  Filled 2020-12-18 (×12): qty 1

## 2020-12-18 MED ORDER — OXYBUTYNIN CHLORIDE ER 10 MG PO TB24
10.0000 mg | ORAL_TABLET | Freq: Every day | ORAL | Status: DC
  Filled 2020-12-18 (×2): qty 1

## 2020-12-18 MED ORDER — SODIUM CHLORIDE 0.9 % IV SOLN
400.0000 mg | INTRAVENOUS | Status: DC
  Administered 2020-12-19 – 2020-12-21 (×2): 400 mg via INTRAVENOUS
  Filled 2020-12-18 (×2): qty 8

## 2020-12-18 MED ORDER — SODIUM CHLORIDE 0.9 % IV SOLN: INTRAVENOUS | Status: DC

## 2020-12-18 NOTE — ED Notes (Signed)
Patient transported to CT 

## 2020-12-18 NOTE — Progress Notes (Signed)
Pharmacy Antibiotic Note  Andrea [name] is a 75 y.o. female admitted on 12/18/2020 with  calcaneal osteomyelitis  and possible UTI.  Pharmacy has been consulted for meropenem dosing. 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 Bone culture grew P. Aeruginosa. She presents with AKI and AMS 10/13.  Per recent ID clinic note does not appear she was eating well at SNF.    Today, 12/18/2020 Renal: SCr 2.92 (from baseline ~1)  Plan: Per ID will change cefepime to meropenem due to acute encephalopathy as cefepime may cause especially in elderly in the setting of AKI where may have drug accumulation.  Based on current renal function, meropenem 1gm IV q12h Also cover possible UTI Daptomycin dose adjusted to 400mg IV q48h for CrCL < 30 ml/min CK - 57 today  Temp (24hrs), Avg:98.8 F (37.1 C), Min:98.8 F (37.1 C), Max:98.8 F (37.1 C)  Recent Labs  Lab 12/18/20 1022 12/18/20 1224  WBC 9.5  --   CREATININE 2.92*  --   LATICACIDVEN 0.9 1.0    Estimated Creatinine Clearance: 13.7 mL/min (A) (by C-G formula based on SCr of 2.92 mg/dL (H)).    Allergies  Allergen Reactions   Metformin And Related Diarrhea   Sulfa Antibiotics Rash    Antimicrobials this admission: PTA dapto/cefepime Meropenem 10/13 >>  Dose adjustments this admission:   Microbiology results: 10/13 BCx:  10/13 UCx:     Thank you for allowing pharmacy to be a part of this patient's care.  Doreene Eland, PharmD, BCPS.   Work Cell: [phone] 12/18/2020 4:58 PM

## 2020-12-18 NOTE — ED Triage Notes (Signed)
Pt presents via POV with c/o altered mental status. Per staff at facility this began yesterday. Facility also reports patient has been hallucinating and this is not her baseline. Pt repeatedly saying "mama" upon arrival to ed and not able to answer questions appropriately.cbg 146.

## 2020-12-18 NOTE — Consult Note (Signed)
Infectious Disease     Reason for Consult:AMS    Referring Physician: Dr Francine Graven Date of Admission:  12/18/2020   Principal Problem:   Acute metabolic encephalopathy Active Problems:   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 edema, with long-term current use of insulin (HCC)   AKI (acute kidney injury) ([place])   Delirium   Chronic systolic CHF (congestive heart failure) (HCC)   Osteomyelitis of foot, left, acute ([place])   HPI: Andrea [name] is a 75 y.o. female admitted from facility with confusion of one days duration and found to have ARF. She has been on cefepime and daptomycin for a heel osteomyelitis and has a wound vac in place. Daughter says she was doing well until last 1 day when she started to not recognize staff at the facility and was not eating or drinking. She also has  a history of diabetes mellitus, recent left hip replacement, hypertension, lipidemia, CAD, TIA, history of CSF rhinorrhea, strep pneumo meningitis in September 2021 followed by surgery to correct the CSF rhinorrhea  She was admitted to [facility] between 11/17/2020 until 12/01/2020 for the L heel osteo and infection which occurred after she had left hip replacement on 08/19/2020.  The left heel was cut by the boot she had on in the OR.  She went to rehab a following that and the wound did not heal well.  She underwent debridement of the ulcer with partial calcanectomy and wound VAC placement.  The bone culture was positive for Pseudomonas which was pansensitive.  No bone tissue was sent for histopathological examination.   She was discharged on cefepime and daptomycin to complete 6 weeks of treatment which will end on 12/28/2020.  [facility] last [facility] shows a creatinine of 1.29 from 12/12/2020 and previously it was 1.09 on 12/09/2020.  Her CBC was  WBC of 5.9 with 7.5% eosinophils which later went up to 8.  Sedimentation rate was  24 on 930 and on 10 7 it is 48.  CK was  normal. 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   Depression    Diabetes mellitus type 2, uncomplicated ([place])    Diabetic retinopathy ([place])    Dupuytren contracture 2011   s/p surgery to LEFT hand   Frequent urinary tract infections    GERD (gastroesophageal reflux disease)    Grade I diastolic dysfunction    HTN (hypertension)    Hyperlipidemia, unspecified    Irritable bowel syndrome    Meningitis 11/2019   MI (myocardial infarction) ([place]) 1994   no stent   Neuromuscular disorder (HCC)    nerve pain in back and legs   Osteoporosis    Pneumonia 2021   RBBB    Sepsis ([place]) 11/2019   Skin cancer    TIA (transient ischemic attack) 2014   Vitamin D deficiency, unspecified    Wheezing    Past Surgical History:  Procedure Laterality Date   BACK SURGERY     lower due to spinal stenosis   BONE BIOPSY Left 11/20/2020   Procedure: BONE BIOPSY;  Surgeon: Edrick Kins, DPM;  Location: [facility] ORS;  Service: Podiatry;  Laterality: Left;   CARDIAC CATHETERIZATION  1994   CATARACT EXTRACTION, BILATERAL Bilateral    COLONOSCOPY     x3   COLONOSCOPY WITH PROPOFOL N/A 02/08/2019   Procedure: COLONOSCOPY WITH PROPOFOL;  Surgeon: [name], MD;  Location: [facility] ENDOSCOPY;  Service: Gastroenterology;  Laterality: N/A;   CORONARY ANGIOPLASTY  1994   DUPUYTREN CONTRACTURE RELEASE Left 2011   HARDWARE REMOVAL Left 08/19/2020   Procedure: HARDWARE REMOVAL;  Surgeon: Hessie Knows, MD;  Location: [facility] ORS;  Service: Orthopedics;  Laterality: Left;   HIP FRACTURE SURGERY  11/2013   IRRIGATION AND DEBRIDEMENT FOOT Left 11/20/2020   Procedure: IRRIGATION AND DEBRIDEMENT FOOT-Heel Ulcer;  Surgeon: Edrick Kins, DPM;  Location: [facility] ORS;  Service:  Podiatry;  Laterality: Left;   PR COLSC FLX W/REMOVAL LESION BY HOT BX FORCEPS   08/21/2015   Procedure: COLONOSCOPY, FLEXIBLE, PROXIMAL TO SPLENIC FLEXURE; W/REMOVAL TUMOR/POLYP/OTHER LESION, HOT BX FORCEP/CAUTE; Surgeon: Carlena Hurl, MD; Location: [facility]; Service: General Surgery   REPAIR DURAL / CSF LEAK     to left sinus   TEE WITHOUT CARDIOVERSION N/A 11/20/2019   Procedure: TRANSESOPHAGEAL ECHOCARDIOGRAM (TEE);  Surgeon: Nelva Bush, MD;  Location: [facility] ORS;  Service: Cardiovascular;  Laterality: N/A;   TOTAL HIP ARTHROPLASTY Left 08/19/2020   Procedure: TOTAL HIP ARTHROPLASTY ANTERIOR APPROACH;  Surgeon: Hessie Knows, MD;  Location: [facility] ORS;  Service: Orthopedics;  Laterality: Left;   Social History   Tobacco Use   Smoking status: Former    Packs/day: 1.00    Years: 30.00    Pack years: 30.00    Types: Cigarettes    Quit date: 07/17/2012    Years since quitting: 8.4   Smokeless tobacco: Never   Tobacco comments:       Vaping Use   Vaping Use: Never used  Substance Use Topics   Alcohol use: Not Currently    Alcohol/week: 0.0 standard drinks   Drug use: No   Family History  Problem Relation Age of Onset   Depression Daughter    Arthritis Daughter        spine   Plantar fasciitis Daughter    Anxiety disorder Daughter    Hyperlipidemia Daughter    AAA (abdominal aortic aneurysm) Daughter    Hypertension Mother    Stroke Mother    Cataracts Mother    Depression Mother    Heart disease Mother    Asthma Brother    Diabetes Daughter    Hyperlipidemia Daughter    Hypertension Daughter    Breast cancer Cousin        maternal    Allergies:  Allergies  Allergen Reactions   Metformin And Related Diarrhea   Sulfa Antibiotics Rash    Current antibiotics: Antibiotics Given (last 72 hours)     None       MEDICATIONS:  vitamin C  500 mg Oral BID   atorvastatin  20 mg Oral QHS   calcium-vitamin D  1 tablet Oral BID   heparin  5,000  Units Subcutaneous Q8H   metoprolol succinate  25 mg Oral Daily   multivitamin-lutein  1 capsule Oral BID   omega-3 acid ethyl esters  1,000 mg Oral Daily   oxybutynin  10 mg Oral QHS   [START ON 12/19/2020] pantoprazole  40 mg Oral Daily   sertraline  25 mg Oral Daily   vitamin B-12  1,000 mcg Oral Daily   [START ON 12/19/2020] Vitamin D (Ergocalciferol)  50,000 Units Oral Q7 days  zinc sulfate  220 mg Oral Daily    Review of Systems - unable to obtain   OBJECTIVE: Temp:  [98.8 F (37.1 C)] 98.8 F (37.1 C) (10/13 1156) Pulse Rate:  [99-100] 100 (10/13 1156) Resp:  [17-19] 17 (10/13 1156) BP: (120-121)/(68-77) 121/77 (10/13 1156) SpO2:  [98 %] 98 % (10/13 1156) Physical Exam  Constitutional:  confused, but in no distress HENT: [place]/AT, PERRLA, no scleral icterus Mouth/Throat: Oropharynx is clear and moist. Cardiovascular: Normal rate, regular rhythm and normal heart sounds. Exam reveals no gallop and no friction rub.  No murmur heard.  Pulmonary/Chest: Effort normal and breath sounds normal. No respiratory distress.  has no wheezes.  Neck = supple, no nuchal rigidity Abdominal: Soft. Bowel sounds are normal.  exhibits no distension. There is no tenderness.  Lymphadenopathy: no cervical adenopathy. No axillary adenopathy Neurological: confused Skin: Skin is warm and dry. L heel with wound vac in place Psychiatric: confused LABS: Results for orders placed or performed during the hospital encounter of 12/18/20 (from the past 48 hour(s))  Lactic acid, plasma     Status: None   Collection Time: 12/18/20 10:22 AM  Result Value Ref Range   Lactic Acid, Venous 0.9 0.5 - 1.9 mmol/L    Comment: Performed at [facility], [address] [place], [place] [address]  CBC with Differential     Status: Abnormal   Collection Time: 12/18/20 10:22 AM  Result Value Ref Range   WBC 9.5 4.0 - 10.5 K/uL   RBC 3.80 (L) 3.87 - 5.11 MIL/uL   Hemoglobin 11.4 (L) 12.0 - 15.0 g/dL    HCT 35.1 (L) 36.0 - 46.0 %   MCV 92.4 80.0 - 100.0 fL   MCH 30.0 26.0 - 34.0 pg   MCHC 32.5 30.0 - 36.0 g/dL   RDW 17.3 (H) 11.5 - 15.5 %   Platelets 188 150 - 400 K/uL   nRBC 0.0 0.0 - 0.2 %   Neutrophils Relative % 62 %   Neutro Abs 5.9 1.7 - 7.7 K/uL   Lymphocytes Relative 19 %   Lymphs Abs 1.8 0.7 - 4.0 K/uL   Monocytes Relative 8 %   Monocytes Absolute 0.8 0.1 - 1.0 K/uL   Eosinophils Relative 10 %   Eosinophils Absolute 1.0 (H) 0.0 - 0.5 K/uL   Basophils Relative 1 %   Basophils Absolute 0.1 0.0 - 0.1 K/uL   Immature Granulocytes 0 %   Abs Immature Granulocytes 0.03 0.00 - 0.07 K/uL    Comment: Performed at [facility], [address] [place], [place] [address]  Comprehensive metabolic panel     Status: Abnormal   Collection Time: 12/18/20 10:22 AM  Result Value Ref Range   Sodium 138 135 - 145 mmol/L   Potassium 4.5 3.5 - 5.1 mmol/L   Chloride 106 98 - 111 mmol/L   CO2 24 22 - 32 mmol/L   Glucose, Bld 120 (H) 70 - 99 mg/dL    Comment: Glucose reference range applies only to samples taken after fasting for at least 8 hours.   BUN 55 (H) 8 - 23 mg/dL   Creatinine, Ser 2.92 (H) 0.44 - 1.00 mg/dL   Calcium 9.1 8.9 - 10.3 mg/dL   Total Protein 6.3 (L) 6.5 - 8.1 g/dL   Albumin 2.6 (L) 3.5 - 5.0 g/dL   AST 17 15 - 41 U/L   ALT 16 0 - 44 U/L   Alkaline Phosphatase 130 (H) 38 - 126 U/L  Total Bilirubin 0.8 0.3 - 1.2 mg/dL   GFR, Estimated 16 (L) >60 mL/min    Comment: (NOTE) Calculated using the CKD-EPI Creatinine Equation (2021)    Anion gap 8 5 - 15    Comment: Performed at [facility], [place], [place] [address]  Troponin I (High Sensitivity)     Status: Abnormal   Collection Time: 12/18/20 10:22 AM  Result Value Ref Range   Troponin I (High Sensitivity) 27 (H) <18 ng/L    Comment: (NOTE) Elevated high sensitivity troponin I (hsTnI) values and significant  changes across serial measurements may suggest ACS but many other   chronic and acute conditions are known to elevate hsTnI results.  Refer to the "Links" section for chest pain algorithms and additional  guidance. Performed at [facility], [address] [place], [place] [address]   Resp Panel by RT-PCR (Flu A&B, Covid) Nasopharyngeal Swab     Status: None   Collection Time: 12/18/20 10:24 AM   Specimen: Nasopharyngeal Swab; Nasopharyngeal(NP) swabs in vial transport medium  Result Value Ref Range   SARS Coronavirus 2 by RT PCR NEGATIVE NEGATIVE    Comment: (NOTE) SARS-CoV-2 target nucleic acids are NOT DETECTED.  The SARS-CoV-2 RNA is generally detectable in upper respiratory specimens during the acute phase of infection. The lowest concentration of SARS-CoV-2 viral copies this assay can detect is 138 copies/mL. A negative result does not preclude SARS-Cov-2 infection and should not be used as the sole basis for treatment or other patient management decisions. A negative result may occur with  improper specimen collection/handling, submission of specimen other than nasopharyngeal swab, presence of viral mutation(s) within the areas targeted by this assay, and inadequate number of viral copies(<138 copies/mL). A negative result must be combined with clinical observations, patient history, and epidemiological information. The expected result is Negative.  Fact Sheet for Patients:  EntrepreneurPulse.com.au  Fact Sheet for Healthcare Providers:  IncredibleEmployment.be  This test is no t yet approved or cleared by the [place] FDA and  has been authorized for detection and/or diagnosis of SARS-CoV-2 by FDA under an Emergency Use Authorization (EUA). This EUA will remain  in effect (meaning this test can be used) for the duration of the COVID-19 declaration under Section 564(b)(1) of the Act, 21 U.S.C.section 360bbb-3(b)(1), unless the authorization is terminated  or revoked sooner.        Influenza A by PCR NEGATIVE NEGATIVE   Influenza B by PCR NEGATIVE NEGATIVE    Comment: (NOTE) The Xpert Xpress SARS-CoV-2/FLU/RSV plus assay is intended as an aid in the diagnosis of influenza from Nasopharyngeal swab specimens and should not be used as a sole basis for treatment. Nasal washings and aspirates are unacceptable for Xpert Xpress SARS-CoV-2/FLU/RSV testing.  Fact Sheet for Patients: EntrepreneurPulse.com.au  Fact Sheet for Healthcare Providers: IncredibleEmployment.be  This test is not yet approved or cleared by the [place] FDA and has been authorized for detection and/or diagnosis of SARS-CoV-2 by FDA under an Emergency Use Authorization (EUA). This EUA will remain in effect (meaning this test can be used) for the duration of the COVID-19 declaration under Section 564(b)(1) of the Act, 21 U.S.C. section 360bbb-3(b)(1), unless the authorization is terminated or revoked.  Performed at [facility], [address] [place], [place] [address]   Lactic acid, plasma     Status: None   Collection Time: 12/18/20 12:24 PM  Result Value Ref Range   Lactic Acid, Venous 1.0 0.5 - 1.9  mmol/L    Comment: Performed at [facility], [place], [place] [address]  Troponin I (High Sensitivity)     Status: Abnormal   Collection Time: 12/18/20 12:24 PM  Result Value Ref Range   Troponin I (High Sensitivity) 24 (H) <18 ng/L    Comment: (NOTE) Elevated high sensitivity troponin I (hsTnI) values and significant  changes across serial measurements may suggest ACS but many other  chronic and acute conditions are known to elevate hsTnI results.  Refer to the "Links" section for chest pain algorithms and additional  guidance. Performed at [facility] [address] [place], [place] [address]   CK     Status: None   Collection Time: 12/18/20 12:24 PM  Result Value Ref Range   Total CK 57 38 - 234  U/L    Comment: HEMOLYSIS AT THIS LEVEL MAY AFFECT RESULT Performed at [facility], [address] [place], [place] [address]   Urinalysis, Complete w Microscopic     Status: Abnormal   Collection Time: 12/18/20  1:55 PM  Result Value Ref Range   Color, Urine YELLOW (A) YELLOW   APPearance CLOUDY (A) CLEAR   Specific Gravity, Urine 1.010 1.005 - 1.030   pH 6.0 5.0 - 8.0   Glucose, UA NEGATIVE NEGATIVE mg/dL   Hgb urine dipstick NEGATIVE NEGATIVE   Bilirubin Urine NEGATIVE NEGATIVE   Ketones, ur 5 (A) NEGATIVE mg/dL   Protein, ur 100 (A) NEGATIVE mg/dL   Nitrite NEGATIVE NEGATIVE   Leukocytes,Ua LARGE (A) NEGATIVE   RBC / HPF 6-10 0 - 5 RBC/hpf   WBC, UA >50 (H) 0 - 5 WBC/hpf   Bacteria, UA NONE SEEN NONE SEEN   Squamous Epithelial / LPF 0-5 0 - 5   WBC Clumps PRESENT    Mucus PRESENT    Hyaline Casts, UA PRESENT    Amorphous Crystal PRESENT     Comment: Performed at [facility], [address] [place], [place] [address]   No components found for: ESR, C REACTIVE PROTEIN MICRO: Recent Results (from the past 720 hour(s))  Surgical pcr screen     Status: None   Collection Time: 11/19/20 12:41 PM   Specimen: Nasal Mucosa; Nasal Swab  Result Value Ref Range Status   MRSA, PCR NEGATIVE NEGATIVE Final   Staphylococcus aureus NEGATIVE NEGATIVE Final    Comment: (NOTE) The Xpert SA Assay (FDA approved for NASAL specimens in patients 57 years of age and older), is one component of a comprehensive surveillance program. It is not intended to diagnose infection nor to guide or monitor treatment. Performed at [facility], [place], [place] [address]   Aerobic/Anaerobic Culture w Gram Stain (surgical/deep wound)     Status: None   Collection Time: 11/20/20  5:08 PM   Specimen: PATH Bone biopsy; Tissue  Result Value Ref Range Status   Specimen Description   Final    HEEL Performed at [facility], [address]  [place], [place] [address]    Special Requests   Final    LEFT HEEL BONE CULTURE Performed at [facility], [place], [place] [address]    Gram Stain NO WBC SEEN RARE GRAM NEGATIVE RODS   Final   Culture   Final    RARE PSEUDOMONAS AERUGINOSA NO ANAEROBES ISOLATED Performed at [facility], [place] [address], [place], [place] [address]    Report Status 11/25/2020 FINAL  Final   Organism ID, Bacteria PSEUDOMONAS AERUGINOSA  Final      Susceptibility   Pseudomonas aeruginosa - MIC*    CEFTAZIDIME 4 SENSITIVE Sensitive     CIPROFLOXACIN <=0.25 SENSITIVE Sensitive     GENTAMICIN <=1 SENSITIVE Sensitive     IMIPENEM 2 SENSITIVE Sensitive     PIP/TAZO 16 SENSITIVE Sensitive     CEFEPIME 4 SENSITIVE Sensitive     * RARE PSEUDOMONAS AERUGINOSA  Aerobic/Anaerobic Culture w Gram Stain (surgical/deep wound)     Status: None   Collection Time: 11/20/20  5:08 PM   Specimen: PATH Bone biopsy; Tissue  Result Value Ref Range Status   Specimen Description   Final    WOUND Performed at [facility], [address], [place], [place] [address]    Special Requests   Final    LEFT HEEL WD CULTURE Performed at [facility], [place], [place] [address]    Gram Stain NO WBC SEEN NO ORGANISMS SEEN   Final   Culture   Final    No growth aerobically or anaerobically. Performed at [facility], [place] [address], [place], [place] [address]    Report Status 11/25/2020 FINAL  Final  Resp Panel by RT-PCR (Flu A&B, Covid) Nasopharyngeal Swab     Status: None   Collection Time: 12/01/20 11:18 AM   Specimen: Nasopharyngeal Swab; Nasopharyngeal(NP) swabs in vial transport medium  Result Value Ref Range Status   SARS Coronavirus 2 by RT PCR NEGATIVE NEGATIVE Final    Comment: (NOTE) SARS-CoV-2 target nucleic acids are NOT DETECTED.  The SARS-CoV-2 RNA is generally detectable in upper respiratory specimens during the acute phase of  infection. The lowest concentration of SARS-CoV-2 viral copies this assay can detect is 138 copies/mL. A negative result does not preclude SARS-Cov-2 infection and should not be used as the sole basis for treatment or other patient management decisions. A negative result may occur with  improper specimen collection/handling, submission of specimen other than nasopharyngeal swab, presence of viral mutation(s) within the areas targeted by this assay, and inadequate number of viral copies(<138 copies/mL). A negative result must be combined with clinical observations, patient history, and epidemiological information. The expected result is Negative.  Fact Sheet for Patients:  EntrepreneurPulse.com.au  Fact Sheet for Healthcare Providers:  IncredibleEmployment.be  This test is no t yet approved or cleared by the [place] FDA and  has been authorized for detection and/or diagnosis of SARS-CoV-2 by FDA under an Emergency Use Authorization (EUA). This EUA will remain  in effect (meaning this test can be used) for the duration of the COVID-19 declaration under Section 564(b)(1) of the Act, 21 U.S.C.section 360bbb-3(b)(1), unless the authorization is terminated  or revoked sooner.       Influenza A by PCR NEGATIVE NEGATIVE Final   Influenza B by PCR NEGATIVE NEGATIVE Final    Comment: (NOTE) The Xpert Xpress SARS-CoV-2/FLU/RSV plus assay is intended as an aid in the diagnosis of influenza from Nasopharyngeal swab specimens and should not be used as a sole basis for treatment. Nasal washings and aspirates are unacceptable for Xpert Xpress SARS-CoV-2/FLU/RSV testing.  Fact Sheet for Patients: EntrepreneurPulse.com.au  Fact Sheet for Healthcare Providers: IncredibleEmployment.be  This test is not yet approved or cleared by the [place] FDA and has been authorized for detection and/or diagnosis of SARS-CoV-2  by FDA under an Emergency Use Authorization (EUA). This EUA will remain in effect (meaning this test can be used) for  the duration of the COVID-19 declaration under Section 564(b)(1) of the Act, 21 U.S.C. section 360bbb-3(b)(1), unless the authorization is terminated or revoked.  Performed at [facility], [address] [place], [place] [address]   Resp Panel by RT-PCR (Flu A&B, Covid) Nasopharyngeal Swab     Status: None   Collection Time: 12/18/20 10:24 AM   Specimen: Nasopharyngeal Swab; Nasopharyngeal(NP) swabs in vial transport medium  Result Value Ref Range Status   SARS Coronavirus 2 by RT PCR NEGATIVE NEGATIVE Final    Comment: (NOTE) SARS-CoV-2 target nucleic acids are NOT DETECTED.  The SARS-CoV-2 RNA is generally detectable in upper respiratory specimens during the acute phase of infection. The lowest concentration of SARS-CoV-2 viral copies this assay can detect is 138 copies/mL. A negative result does not preclude SARS-Cov-2 infection and should not be used as the sole basis for treatment or other patient management decisions. A negative result may occur with  improper specimen collection/handling, submission of specimen other than nasopharyngeal swab, presence of viral mutation(s) within the areas targeted by this assay, and inadequate number of viral copies(<138 copies/mL). A negative result must be combined with clinical observations, patient history, and epidemiological information. The expected result is Negative.  Fact Sheet for Patients:  EntrepreneurPulse.com.au  Fact Sheet for Healthcare Providers:  IncredibleEmployment.be  This test is no t yet approved or cleared by the [place] FDA and  has been authorized for detection and/or diagnosis of SARS-CoV-2 by FDA under an Emergency Use Authorization (EUA). This EUA will remain  in effect (meaning this test can be used) for the duration of the COVID-19  declaration under Section 564(b)(1) of the Act, 21 U.S.C.section 360bbb-3(b)(1), unless the authorization is terminated  or revoked sooner.       Influenza A by PCR NEGATIVE NEGATIVE Final   Influenza B by PCR NEGATIVE NEGATIVE Final    Comment: (NOTE) The Xpert Xpress SARS-CoV-2/FLU/RSV plus assay is intended as an aid in the diagnosis of influenza from Nasopharyngeal swab specimens and should not be used as a sole basis for treatment. Nasal washings and aspirates are unacceptable for Xpert Xpress SARS-CoV-2/FLU/RSV testing.  Fact Sheet for Patients: EntrepreneurPulse.com.au  Fact Sheet for Healthcare Providers: IncredibleEmployment.be  This test is not yet approved or cleared by the [place] FDA and has been authorized for detection and/or diagnosis of SARS-CoV-2 by FDA under an Emergency Use Authorization (EUA). This EUA will remain in effect (meaning this test can be used) for the duration of the COVID-19 declaration under Section 564(b)(1) of the Act, 21 U.S.C. section 360bbb-3(b)(1), unless the authorization is terminated or revoked.  Performed at [facility], [address] [place], [place] [address]     IMAGING: CT Head Wo Contrast  Result Date: 12/18/2020 CLINICAL DATA:  Mental status change, unknown cause. EXAM: CT HEAD WITHOUT CONTRAST TECHNIQUE: Contiguous axial images were obtained from the base of the skull through the vertex without intravenous contrast. COMPARISON:  Prior head CT 11/15/2019. FINDINGS: Brain: Mild generalized cerebral atrophy. 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 There is no acute intracranial hemorrhage. No demarcated cortical infarct. No extra-axial fluid collection. No evidence of an intracranial mass. No midline shift. Partially empty sella turcica. Vascular: No hyperdense vessel.  Atherosclerotic calcifications. Skull: Normal.  Negative for fracture or focal lesion. Sinuses/Orbits: Visualized orbits show no acute finding. Postsurgical appearance of the paranasal sinuses. Trace scattered paranasal sinus mucosal thickening. IMPRESSION: No evidence of acute intracranial abnormality. Redemonstrated chronic  lacunar infarct within the left cerebellar hemisphere. Mild generalized cerebral atrophy. Minimal paranasal sinus mucosal thickening. Electronically Signed   By: Kellie Simmering D.O.   On: 12/18/2020 11:13   DG Chest Portable 1 View  Result Date: 12/18/2020 CLINICAL DATA:  Altered mental status. Additional history provided: Altered mental status, history of CAD, CHF, COPD, diabetes mellitus, hypertension, myocardial infarction, pneumonia, right bundle branch block, sepsis. EXAM: PORTABLE CHEST 1 VIEW COMPARISON:  Prior chest radiographs 11/24/2020 and earlier. FINDINGS: Right-sided PICC with tip projecting at the level of the lower SVC/superior cavoatrial junction. Mild cardiomegaly, unchanged. Aortic atherosclerosis. Central pulmonary vascular congestion without frank pulmonary edema. Minimal atelectasis within the left lung base. No appreciable airspace consolidation. No evidence of pleural effusion or pneumothorax. No acute bony abnormality identified. IMPRESSION: Mild cardiomegaly, unchanged. Central pulmonary vascular congestion without overt pulmonary edema. Minimal atelectasis within the left lung base. Aortic Atherosclerosis (ICD10-I70.0). Electronically Signed   By: Kellie Simmering D.O.   On: 12/18/2020 11:07   DG Chest Port 1 View  Result Date: 11/24/2020 CLINICAL DATA:  PICC line placement EXAM: PORTABLE CHEST 1 VIEW COMPARISON:  Portable exam 1628 hours compared to 12/26/2019 FINDINGS: RIGHT arm PICC line tip projects over SVC. Upper normal size of cardiac silhouette. Mediastinal contours and pulmonary vascularity normal. Atherosclerotic calcification aorta. Lungs clear. No infiltrate, pleural effusion, or pneumothorax. Bones  demineralized with scattered degenerative changes thoracic spine. IMPRESSION: Tip of RIGHT arm PICC line projects over SVC. Aortic Atherosclerosis (ICD10-I70.0). Electronically Signed   By: Lavonia Dana M.D.   On: 11/24/2020 16:59   DG Foot Complete Left  Result Date: 11/20/2020 CLINICAL DATA:  Postop EXAM: LEFT FOOT - COMPLETE 3+ VIEW COMPARISON:  11/17/2020 FINDINGS: Frontal, oblique, lateral views of the left foot are obtained. Postsurgical changes are seen from partial resection of the dorsal aspect of the calcaneus, with likely impregnated antibiotic beads at the surgical site. Overlying surgical drain. Bones are diffusely osteopenic. Chronic degenerative changes of the metatarsophalangeal and interphalangeal joints, with hammertoe deformities again noted. IMPRESSION: 1. Postoperative changes of the calcaneus as above. 2. Diffuse osteopenia and osteoarthritis. Electronically Signed   By: Randa Ngo M.D.   On: 11/20/2020 20:24   [place] EKG SITE RITE  Result Date: 11/24/2020 If Site Rite image not attached, placement could not be confirmed due to current cardiac rhythm.   Assessment:   Andrea [name] is a 75 y.o. female with DM being treated for a L heel ulcer and osteo with cefepime and daptonmycin who was doing well at [facility] but now admitted with AMS x 1 day. ARF, positive UA. She likely has UTI leading to decreased PO intake and ARF.  She has some myoclonus on exam so possibly cefepime neurotoxicity related to ARF and elevated levels.   Recommendations Cong dapto- dose reduced. UA + - seems to have a UTI despite being on cefepime - will empirically start meropenem for now  Continue wound vac Thank you very much for allowing me to participate in the care of this patient. Please call with questions.   Cheral Marker. Ola Spurr, MD

## 2020-12-18 NOTE — ED Provider Notes (Signed)
[facility] Emergency Department Provider Note   ____________________________________________   Event Date/Time   First MD Initiated Contact with Patient 12/18/20 1015     (approximate)  I have reviewed the triage vital signs and the nursing notes.   HISTORY  Chief Complaint Altered Mental Status    HPI Andrea [name] is a 75 y.o. female with past medical history of hypertension, hyperlipidemia, diabetes, CAD, COPD, osteomyelitis, and meningitis who presents to the ED for altered mental status.  History is limited as patient is currently confused, repeatedly saying "mama."  Per EMS, staff at [facility] has noticed that patient has been increasingly confused and hallucinating over the past 24 hours.  They reportedly did blood work and got a UA that was unremarkable yesterday, but when patient's confusion increased today EMS was called.  Patient is currently receiving 6 weeks of IV antibiotics via right arm PICC line for left heel osteomyelitis.        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   Depression    Diabetes mellitus type 2, uncomplicated ([place])    Diabetic retinopathy ([place])    Dupuytren contracture 2011   s/p surgery to LEFT hand   Frequent urinary tract infections    GERD (gastroesophageal reflux disease)    Grade I diastolic dysfunction    HTN (hypertension)    Hyperlipidemia, unspecified    Irritable bowel syndrome    Meningitis 11/2019   MI (myocardial infarction) ([place]) 1994   no stent   Neuromuscular disorder (HCC)    nerve pain in back and legs   Osteoporosis    Pneumonia 2021   RBBB    Sepsis ([place]) 11/2019   Skin cancer    TIA (transient ischemic attack) 2014   Vitamin D  deficiency, unspecified    Wheezing     Patient Active Problem List   Diagnosis Date Noted   Status post peripherally inserted central catheter (PICC) central line placement    Osteomyelitis ([place]) 11/17/2020   Cellulitis of left lower extremity 11/13/2020   Hypoglycemia 11/13/2020   Non healing left heel wound 11/13/2020   Status post total hip replacement, 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   Pain due to onychomycosis of toenail of left foot 03/06/2020   Malaise and fatigue 12/28/2019   UTI (urinary tract infection) 12/28/2019   Hyponatremia 12/27/2019   Intertrigo 12/27/2019   Acute respiratory failure with hypoxia ([place]) 11/23/2019   History of meningitis 11/23/2019   CSF leak from nose 11/22/2019   History of myocardial infarction 03/09/2019   PAD (peripheral artery disease) ([place]) 12/28/2018   Moderate episode of recurrent major depressive disorder ([place]) 12/19/2017   Leg length discrepancy 09/21/2017   Coronary artery disease involving native coronary artery 01/06/2017   Drug-induced constipation 01/06/2017   Spinal stenosis of lumbar region 01/06/2017   Spondylolisthesis of lumbar region 12/30/2016   Obese 04/16/2015   History of colon polyps 11/23/2012   Bundle branch block, 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   Personal history of transient ischemic attack (TIA), and cerebral infarction without residual deficits 11/23/2012   Encounter for long-term (current) use of aspirin 11/23/2012   Status post percutaneous transluminal coronary angioplasty 11/23/2012   Transient  ischemic attack (TIA), and cerebral infarction without residual deficits(V12.54) 11/23/2012   Anemia 08/17/2012   Chronic obstructive pulmonary disease ([place]) 08/17/2012   HLD (hyperlipidemia) 08/17/2012   OP (osteoporosis) 08/17/2012   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 edema, with long-term current use of insulin ([place]) 08/17/2012   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[place]) 07/24/2012    Past Surgical History:  Procedure Laterality Date   BACK SURGERY     lower due to spinal stenosis   BONE BIOPSY Left 11/20/2020   Procedure: BONE BIOPSY;  Surgeon: Edrick Kins, DPM;  Location: [facility] ORS;  Service: Podiatry;  Laterality: Left;   CARDIAC CATHETERIZATION  1994   CATARACT EXTRACTION, BILATERAL Bilateral    COLONOSCOPY     x3   COLONOSCOPY WITH PROPOFOL N/A 02/08/2019   Procedure: COLONOSCOPY WITH PROPOFOL;  Surgeon: [name], MD;  Location: [facility] ENDOSCOPY;  Service: Gastroenterology;  Laterality: N/A;   CORONARY ANGIOPLASTY  1994   DUPUYTREN CONTRACTURE RELEASE Left 2011   HARDWARE REMOVAL Left 08/19/2020   Procedure: HARDWARE REMOVAL;  Surgeon: Hessie Knows, MD;  Location: [facility] ORS;  Service: Orthopedics;  Laterality: Left;   HIP FRACTURE SURGERY  11/2013   IRRIGATION AND DEBRIDEMENT FOOT Left 11/20/2020   Procedure: IRRIGATION AND DEBRIDEMENT FOOT-Heel Ulcer;  Surgeon: Edrick Kins, DPM;  Location: [facility] ORS;  Service: Podiatry;  Laterality: Left;   PR COLSC FLX W/REMOVAL LESION BY HOT BX FORCEPS   08/21/2015   Procedure: COLONOSCOPY, FLEXIBLE, PROXIMAL TO SPLENIC FLEXURE; W/REMOVAL TUMOR/POLYP/OTHER LESION, HOT BX FORCEP/CAUTE; Surgeon: Carlena Hurl, MD; Location: [facility]; Service: General Surgery   REPAIR DURAL / CSF LEAK     to left sinus   TEE WITHOUT CARDIOVERSION N/A 11/20/2019   Procedure: TRANSESOPHAGEAL ECHOCARDIOGRAM (TEE);  Surgeon: Nelva Bush, MD;  Location: [facility] ORS;  Service: Cardiovascular;  Laterality: N/A;   TOTAL HIP ARTHROPLASTY Left 08/19/2020   Procedure: TOTAL HIP ARTHROPLASTY ANTERIOR APPROACH;  Surgeon: Hessie Knows, MD;  Location: [facility] ORS;  Service: Orthopedics;  Laterality: Left;    Prior to Admission medications   Medication Sig Start Date End Date Taking? Authorizing Provider  atorvastatin (LIPITOR) 20 MG tablet Take 1 tablet (20 mg total) by mouth daily. Patient taking differently:  Take 20 mg by mouth at bedtime. 12/02/20  Yes Max Sane, MD  Calcium Carbonate-Vitamin D 600-400 MG-UNIT tablet Take 1 tablet by mouth 2 (two) times daily.   Yes [provider]  ceFEPime (MAXIPIME) IVPB Inject 2 g into the vein every 12 (twelve) hours. Indication:  Left heel ulcer with osteomyelitis First Dose: Yes Last Day of Therapy:  12/28/2020 Labs - Once weekly:  CBC/D, CMP, ESR, CK Method of administration: IV Push Method of administration may be changed at the discretion of home infusion pharmacist based upon assessment of the patient and/or caregiver's ability to self-administer the medication ordered. 12/01/20 01/03/21 Yes Max Sane, MD  Cyanocobalamin (B-12) 1000 MCG CAPS Take 1,000 mcg by mouth daily.    Yes [provider]  daptomycin (CUBICIN) IVPB Inject 400 mg into the vein daily. Indication:  Left heel ulcer with osteomyelitis First Dose: Yes Last Day of Therapy:  12/28/2020 Labs - Once weekly:  CBC/D, CMP, CK, ESR Method of administration: IV Push Method of administration may be changed at the discretion of home infusion pharmacist based upon assessment of the patient and/or caregiver's ability to self-administer the medication ordered. 12/01/20 01/03/21 Yes Manuella Ghazi, [place],  MD  furosemide (LASIX) 20 MG tablet Take 1 tablet (20 mg total) by mouth daily. 07/03/20  Yes Bacigalupo, Dionne Bucy, MD  gentamicin cream (GARAMYCIN) 0.1 % Apply 1 application topically daily.   Yes [provider]  hydrOXYzine (ATARAX/VISTARIL) 25 MG tablet Take 25 mg by mouth daily.   Yes [provider]  LANTUS SOLOSTAR 100 UNIT/ML Solostar Pen Inject 20 Units into the skin at bedtime. 12/01/20  Yes Max Sane, MD  losartan (COZAAR) 25 MG tablet TAKE 1 TABLET BY MOUTH EVERY DAY 10/09/20  Yes Bacigalupo, Dionne Bucy, MD  metoprolol succinate (TOPROL-XL) 25 MG 24 hr tablet TAKE 1 TABLET BY MOUTH EVERY DAY 05/29/20  Yes Mar Daring, PA-C  Multiple Vitamins-Minerals  (PRESERVISION AREDS 2 PO) Take 1 tablet by mouth in the morning and at bedtime.   Yes [provider]  Omega-3 1000 MG CAPS Take 1,000 mg by mouth daily.   Yes [provider]  omeprazole (PRILOSEC) 40 MG capsule TAKE 1 CAPSULE BY MOUTH EVERY DAY 10/19/20  Yes Bacigalupo, Dionne Bucy, MD  oxybutynin (DITROPAN-XL) 10 MG 24 hr tablet TAKE 1 TABLET BY MOUTH EVERYDAY AT BEDTIME 10/07/20  Yes Bacigalupo, Dionne Bucy, MD  oxyCODONE-acetaminophen (PERCOCET) 5-325 MG tablet Take 1 tablet by mouth every 4 (four) hours as needed for severe pain. 12/12/20  Yes Edrick Kins, DPM  sertraline (ZOLOFT) 25 MG tablet Take 25 mg by mouth daily.   Yes [provider]  TRULICITY 1.5 GQ/6.7YP SOPN INJECT 1.5MG INTO THE SKIN ONCE A WEEK AS DIRECTED Patient taking differently: On friday 07/21/20  Yes Bacigalupo, Dionne Bucy, MD  vitamin C (ASCORBIC ACID) 500 MG tablet Take 500 mg by mouth 2 (two) times daily.   Yes [provider]  Vitamin D, Ergocalciferol, (DRISDOL) 1.25 MG (50000 UNIT) CAPS capsule TAKE ONE CAPSULE BY MOUTH ONCE WEEKLY ON THE SAME DAY EACH WEEK (ON FRIDAYS) Patient taking differently: Take 50,000 Units by mouth every 7 (seven) days. 06/30/20  Yes Jerrol Banana., MD  zinc gluconate 50 MG tablet Take 50 mg by mouth daily.   Yes [provider]  Alcohol Swabs (ALCOHOL PREP) PADS Use twice daily prior to SQ injection of insulin to clean skin 10/02/15   Burnette, Clearnce Sorrel, PA-C  Barberry-Oreg Grape-Goldenseal (BERBERINE COMPLEX PO) Take 1 tablet by mouth 2 (two) times daily with a meal. Patient not taking: No sig reported    [provider]  collagenase (SANTYL) ointment Apply 1 application topically daily. Patient not taking: No sig reported 11/06/20   Felipa Furnace, DPM  Insulin Pen Needle (TRUEPLUS PEN NEEDLES) 31G X 6 MM MISC Use with lantus two times daily. 08/05/20   Virginia Crews, MD  INSULIN SYRINGE 1CC/29G 29G X 1/2" 1 ML MISC For lantus  injections twice daily 10/15/19   Mar Daring, PA-C  OneTouch Delica Lancets 95K MISC To check blood sugar daily  DX: E11.9 07/31/20   Bacigalupo, Dionne Bucy, MD  ONETOUCH VERIO test strip TO CHECK BLOOD SUGAR ONCE DAILY. 10/25/19   Mar Daring, PA-C  potassium chloride (KLOR-CON) 10 MEQ tablet Take 1 tablet (10 mEq total) by mouth daily. 06/05/20   Mar Daring, PA-C    Allergies Metformin and related and Sulfa antibiotics  Family History  Problem Relation Age of Onset   Depression Daughter    Arthritis Daughter        spine   Plantar fasciitis Daughter    Anxiety disorder Daughter  Hyperlipidemia Daughter    AAA (abdominal aortic aneurysm) Daughter    Hypertension Mother    Stroke Mother    Cataracts Mother    Depression Mother    Heart disease Mother    Asthma Brother    Diabetes Daughter    Hyperlipidemia Daughter    Hypertension Daughter    Breast cancer Cousin        maternal    Social History Social History   Tobacco Use   Smoking status: Former    Packs/day: 1.00    Years: 30.00    Pack years: 30.00    Types: Cigarettes    Quit date: 07/17/2012    Years since quitting: 8.4   Smokeless tobacco: Never   Tobacco comments:       Vaping Use   Vaping Use: Never used  Substance Use Topics   Alcohol use: Not Currently    Alcohol/week: 0.0 standard drinks   Drug use: No    Review of Systems Unable to obtain secondary to altered mental status. ____________________________________________   PHYSICAL EXAM:  VITAL SIGNS: ED Triage Vitals  Enc Vitals Group     BP      Pulse      Resp      Temp      Temp src      SpO2      Weight      Height      Head Circumference      Peak Flow      Pain Score      Pain Loc      Pain Edu?      Excl. in [place]?     Constitutional: Awake and alert, oriented to person but not place or time. Eyes: Conjunctivae are normal.  Pupils equal, round, and reactive to light bilaterally. Head:  Atraumatic. Nose: No congestion/rhinnorhea. Mouth/Throat: Mucous membranes are moist. Neck: Normal ROM Cardiovascular: Normal rate, regular rhythm. Grossly normal heart sounds.  2+ radial pulses bilaterally. Respiratory: Normal respiratory effort.  No retractions. Lungs CTAB. Gastrointestinal: Soft and nontender. No distention. Genitourinary: deferred Musculoskeletal: Wound VAC to left heel with no associated erythema, warmth, or tenderness to palpation. Neurologic:  Normal speech, repeatedly stating "mama". No gross focal neurologic deficits are appreciated, moving all extremities equally. Skin:  Skin is warm, dry and intact. No rash noted. Psychiatric: Mood and affect are normal. Speech and behavior are normal.  ____________________________________________   LABS (all labs ordered are listed, but only abnormal results are displayed)  Labs Reviewed  CBC WITH DIFFERENTIAL/PLATELET - Abnormal; Notable for the following components:      Result Value   RBC 3.80 (*)    Hemoglobin 11.4 (*)    HCT 35.1 (*)    RDW 17.3 (*)    Eosinophils Absolute 1.0 (*)    All other components within normal limits  COMPREHENSIVE METABOLIC PANEL - Abnormal; Notable for the following components:   Glucose, Bld 120 (*)    BUN 55 (*)    Creatinine, Ser 2.92 (*)    Total Protein 6.3 (*)    Albumin 2.6 (*)    Alkaline Phosphatase 130 (*)    GFR, Estimated 16 (*)    All other components within normal limits  TROPONIN I (HIGH SENSITIVITY) - Abnormal; Notable for the following components:   Troponin I (High Sensitivity) 27 (*)    All other components within normal limits  CULTURE, BLOOD (ROUTINE X 2)  CULTURE, BLOOD (ROUTINE X 2)  RESP PANEL  BY RT-PCR (FLU A&B, COVID) [id number]  LACTIC ACID, PLASMA  LACTIC ACID, PLASMA  URINALYSIS, COMPLETE (UACMP) WITH MICROSCOPIC   ____________________________________________  EKG  ED ECG REPORT I, Blake Divine, the attending physician, personally viewed and  interpreted this ECG.   Date: 12/18/2020  EKG Time: 10:20  Rate: 104  Rhythm: sinus tachycardia  Axis: Normal  Intervals:right bundle branch block and left anterior fascicular block  ST&T Change: None   PROCEDURES  Procedure(s) performed (including Critical Care):  Procedures   ____________________________________________   INITIAL IMPRESSION / ASSESSMENT AND PLAN / ED COURSE      75 year old female with past medical history of hypertension, hyperlipidemia, diabetes, CAD, COPD, osteomyelitis, meningitis who presents to the ED for increasing confusion and altered mental status noted by staff at her rehab facility.  Patient is awake and alert, but only oriented to person and making repetitive nonsensical statements.  She has no focal neurologic deficits on exam, we will further assess with CT head.  Wound at left heel appears to be healing with no signs of acute infection and I have low suspicion for sepsis overall due to patient continuing to receive antibiotics via PICC line.  We will check chest x-ray and UA, labs are also pending.  No apparent medications on her list that could result in confusion and hallucinations.  CT head is negative for acute process, labs remarkable for AKI but show no significant electrolyte abnormality.  Patient does appear clinically dehydrated, has had poor appetite per daughter for the last couple of days.  We will hydrate with IV fluids and plan to discuss with hospitalist for admission.      ____________________________________________   FINAL CLINICAL IMPRESSION(S) / ED DIAGNOSES  Final diagnoses:  Altered mental status, unspecified altered mental status type  AKI (acute kidney injury) [facility])     ED Discharge Orders     None        Note:  This document was prepared using Dragon voice recognition software and may include unintentional dictation errors.    Blake Divine, MD 12/18/20 1153

## 2020-12-18 NOTE — H&P (Addendum)
History and Physical    QUANASIA DEFINO KGU:[id number] DOB: 10/24/1945 DOA: 12/18/2020  PCP: [name], FNP   Patient coming from: [facility]  I have personally briefly reviewed patient's old medical records in [place]  Chief Complaint: Change in mental status  Most of the history was obtained from patient's daughter at the bedside  HPI: LETISHIA ELLIOTT is a 75 y.o. 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 Patient's daughter states that she received a call from the nursing home 1 day prior to her mother's admission because she had a panic attack.  She went there later in the day to visit with her mom and noticed that she was confused and could not recognize the nurses that had been caring for her for over a week at the facility.  Her oral intake has been poor and she remained lethargic for most of the day.  The nursing home staff had administered hydroxyzine for her anxiety. She states that her sisters had stayed with her mom overnight and on the morning of admission she was unable to recognize her daughters.  She has been hallucinating and repeatedly saying " mama". During my evaluation she is lethargic but arouses to verbal stimuli and is only oriented to person.  She is unable to answer any of my questions. I am unable to do review of systems on this patient due to her mental status changes. Labs show sodium 138, potassium 4.5, chloride 106, bicarb 24, glucose 120, BUN 55, creatinine 2.95 with a baseline of 1.0 about 3 weeks ago, calcium 9.1, alkaline phosphatase 130, albumin 2.6, AST 17, ALT 16,  total protein 6.3, troponin 27, lactic acid 0.9, white count 9.5, hemoglobin 11.4, hematocrit 35.1, MCV 92.4, RDW 17.3, platelet count 188 Respiratory viral panel is negative CT scan of the head without contrast shows no evidence of acute intracranial abnormality.  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  Mild generalized cerebral atrophy. Chest x-ray reviewed by me shows mild cardiomegaly, unchanged.  Central pulmonary vascular congestion without overt pulmonary edema. Twelve-lead EKG reviewed by me shows sinus tachycardia with right bundle branch block and left anterior fascicular block.   ED Course: Patient is a 75 year old female who was brought into the emergency room by EMS for evaluation of change in mental status described as confusion and hallucination. She is on IV antibiotic therapy with daptomycin and cefepime for left heel osteomyelitis. Labs show acute kidney injury 1.0 >> 2.92 and patient has dry mucous membranes and poor skin turgor. She will be admitted to the hospital for further evaluation.   Review of Systems: As per HPI otherwise all other systems reviewed and negative.    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   Depression    Diabetes mellitus type 2, uncomplicated ([place])    Diabetic retinopathy ([place])  Dupuytren contracture 2011   s/p surgery to LEFT hand   Frequent urinary tract infections    GERD (gastroesophageal reflux disease)    Grade I diastolic dysfunction    HTN (hypertension)    Hyperlipidemia, unspecified    Irritable bowel syndrome    Meningitis 11/2019   MI (myocardial infarction) ([place]) 1994   no stent   Neuromuscular disorder (HCC)    nerve pain in back and legs   Osteoporosis     Pneumonia 2021   RBBB    Sepsis ([place]) 11/2019   Skin cancer    TIA (transient ischemic attack) 2014   Vitamin D deficiency, unspecified    Wheezing     Past Surgical History:  Procedure Laterality Date   BACK SURGERY     lower due to spinal stenosis   BONE BIOPSY Left 11/20/2020   Procedure: BONE BIOPSY;  Surgeon: Edrick Kins, DPM;  Location: [facility] ORS;  Service: Podiatry;  Laterality: Left;   CARDIAC CATHETERIZATION  1994   CATARACT EXTRACTION, BILATERAL Bilateral    COLONOSCOPY     x3   COLONOSCOPY WITH PROPOFOL N/A 02/08/2019   Procedure: COLONOSCOPY WITH PROPOFOL;  Surgeon: [name], MD;  Location: [facility] ENDOSCOPY;  Service: Gastroenterology;  Laterality: N/A;   CORONARY ANGIOPLASTY  1994   DUPUYTREN CONTRACTURE RELEASE Left 2011   HARDWARE REMOVAL Left 08/19/2020   Procedure: HARDWARE REMOVAL;  Surgeon: Hessie Knows, MD;  Location: [facility] ORS;  Service: Orthopedics;  Laterality: Left;   HIP FRACTURE SURGERY  11/2013   IRRIGATION AND DEBRIDEMENT FOOT Left 11/20/2020   Procedure: IRRIGATION AND DEBRIDEMENT FOOT-Heel Ulcer;  Surgeon: Edrick Kins, DPM;  Location: [facility] ORS;  Service: Podiatry;  Laterality: Left;   PR COLSC FLX W/REMOVAL LESION BY HOT BX FORCEPS   08/21/2015   Procedure: COLONOSCOPY, FLEXIBLE, PROXIMAL TO SPLENIC FLEXURE; W/REMOVAL TUMOR/POLYP/OTHER LESION, HOT BX FORCEP/CAUTE; Surgeon: Carlena Hurl, MD; Location: [facility]; Service: General Surgery   REPAIR DURAL / CSF LEAK     to left sinus   TEE WITHOUT CARDIOVERSION N/A 11/20/2019   Procedure: TRANSESOPHAGEAL ECHOCARDIOGRAM (TEE);  Surgeon: Nelva Bush, MD;  Location: [facility] ORS;  Service: Cardiovascular;  Laterality: N/A;   TOTAL HIP ARTHROPLASTY Left 08/19/2020   Procedure: TOTAL HIP ARTHROPLASTY ANTERIOR APPROACH;  Surgeon: Hessie Knows, MD;  Location: [facility] ORS;  Service: Orthopedics;  Laterality: Left;     reports that she quit smoking about 8 years ago. Her smoking use included  cigarettes. She has a 30.00 pack-year smoking history. She has never used smokeless tobacco. She reports that she does not currently use alcohol. She reports that she does not use drugs.  Allergies  Allergen Reactions   Metformin And Related Diarrhea   Sulfa Antibiotics Rash    Family History  Problem Relation Age of Onset   Depression Daughter    Arthritis Daughter        spine   Plantar fasciitis Daughter    Anxiety disorder Daughter    Hyperlipidemia Daughter    AAA (abdominal aortic aneurysm) Daughter    Hypertension Mother    Stroke Mother    Cataracts Mother    Depression Mother    Heart disease Mother    Asthma Brother    Diabetes Daughter    Hyperlipidemia Daughter    Hypertension Daughter    Breast cancer Cousin        maternal      Prior to Admission medications   Medication Sig Start Date End Date  Taking? Authorizing Provider  atorvastatin (LIPITOR) 20 MG tablet Take 1 tablet (20 mg total) by mouth daily. Patient taking differently: Take 20 mg by mouth at bedtime. 12/02/20  Yes Max Sane, MD  Calcium Carbonate-Vitamin D 600-400 MG-UNIT tablet Take 1 tablet by mouth 2 (two) times daily.   Yes [provider]  ceFEPime (MAXIPIME) IVPB Inject 2 g into the vein every 12 (twelve) hours. Indication:  Left heel ulcer with osteomyelitis First Dose: Yes Last Day of Therapy:  12/28/2020 Labs - Once weekly:  CBC/D, CMP, ESR, CK Method of administration: IV Push Method of administration may be changed at the discretion of home infusion pharmacist based upon assessment of the patient and/or caregiver's ability to self-administer the medication ordered. 12/01/20 01/03/21 Yes Max Sane, MD  Cyanocobalamin (B-12) 1000 MCG CAPS Take 1,000 mcg by mouth daily.    Yes [provider]  daptomycin (CUBICIN) IVPB Inject 400 mg into the vein daily. Indication:  Left heel ulcer with osteomyelitis First Dose: Yes Last Day of Therapy:  12/28/2020 Labs - Once weekly:   CBC/D, CMP, CK, ESR Method of administration: IV Push Method of administration may be changed at the discretion of home infusion pharmacist based upon assessment of the patient and/or caregiver's ability to self-administer the medication ordered. 12/01/20 01/03/21 Yes Max Sane, MD  furosemide (LASIX) 20 MG tablet Take 1 tablet (20 mg total) by mouth daily. 07/03/20  Yes Bacigalupo, Dionne Bucy, MD  gentamicin cream (GARAMYCIN) 0.1 % Apply 1 application topically daily.   Yes [provider]  hydrOXYzine (ATARAX/VISTARIL) 25 MG tablet Take 25 mg by mouth daily.   Yes [provider]  LANTUS SOLOSTAR 100 UNIT/ML Solostar Pen Inject 20 Units into the skin at bedtime. 12/01/20  Yes Max Sane, MD  losartan (COZAAR) 25 MG tablet TAKE 1 TABLET BY MOUTH EVERY DAY 10/09/20  Yes Bacigalupo, Dionne Bucy, MD  metoprolol succinate (TOPROL-XL) 25 MG 24 hr tablet TAKE 1 TABLET BY MOUTH EVERY DAY 05/29/20  Yes Mar Daring, PA-C  Multiple Vitamins-Minerals (PRESERVISION AREDS 2 PO) Take 1 tablet by mouth in the morning and at bedtime.   Yes [provider]  Omega-3 1000 MG CAPS Take 1,000 mg by mouth daily.   Yes [provider]  omeprazole (PRILOSEC) 40 MG capsule TAKE 1 CAPSULE BY MOUTH EVERY DAY 10/19/20  Yes Bacigalupo, Dionne Bucy, MD  oxybutynin (DITROPAN-XL) 10 MG 24 hr tablet TAKE 1 TABLET BY MOUTH EVERYDAY AT BEDTIME 10/07/20  Yes Bacigalupo, Dionne Bucy, MD  oxyCODONE-acetaminophen (PERCOCET) 5-325 MG tablet Take 1 tablet by mouth every 4 (four) hours as needed for severe pain. 12/12/20  Yes Edrick Kins, DPM  sertraline (ZOLOFT) 25 MG tablet Take 25 mg by mouth daily.   Yes [provider]  TRULICITY 1.5 UR/4.2HC SOPN INJECT 1.5MG INTO THE SKIN ONCE A WEEK AS DIRECTED Patient taking differently: On friday 07/21/20  Yes Bacigalupo, Dionne Bucy, MD  vitamin C (ASCORBIC ACID) 500 MG tablet Take 500 mg by mouth 2 (two) times daily.   Yes [provider]  Vitamin D,  Ergocalciferol, (DRISDOL) 1.25 MG (50000 UNIT) CAPS capsule TAKE ONE CAPSULE BY MOUTH ONCE WEEKLY ON THE SAME DAY EACH WEEK (ON FRIDAYS) Patient taking differently: Take 50,000 Units by mouth every 7 (seven) days. 06/30/20  Yes Jerrol Banana., MD  zinc gluconate 50 MG tablet Take 50 mg by mouth daily.   Yes [provider]  Alcohol Swabs (ALCOHOL PREP) PADS Use twice  daily prior to SQ injection of insulin to clean skin 10/02/15   Mar Daring, PA-C  Barberry-Oreg Grape-Goldenseal (BERBERINE COMPLEX PO) Take 1 tablet by mouth 2 (two) times daily with a meal. Patient not taking: No sig reported    [provider]  collagenase (SANTYL) ointment Apply 1 application topically daily. Patient not taking: No sig reported 11/06/20   Felipa Furnace, DPM  Insulin Pen Needle (TRUEPLUS PEN NEEDLES) 31G X 6 MM MISC Use with lantus two times daily. 08/05/20   Virginia Crews, MD  INSULIN SYRINGE 1CC/29G 29G X 1/2" 1 ML MISC For lantus injections twice daily 10/15/19   Mar Daring, PA-C  OneTouch Delica Lancets 41O MISC To check blood sugar daily  DX: E11.9 07/31/20   Bacigalupo, Dionne Bucy, MD  ONETOUCH VERIO test strip TO CHECK BLOOD SUGAR ONCE DAILY. 10/25/19   Mar Daring, PA-C  potassium chloride (KLOR-CON) 10 MEQ tablet Take 1 tablet (10 mEq total) by mouth daily. 06/05/20   Mar Daring, PA-C    Physical Exam: Vitals:   12/18/20 1030 12/18/20 1156  BP: 120/68 121/77  Pulse: 99 100  Resp: 19 17  Temp:  98.8 F (37.1 C)  TempSrc:  Oral  SpO2: 98% 98%     Vitals:   12/18/20 1030 12/18/20 1156  BP: 120/68 121/77  Pulse: 99 100  Resp: 19 17  Temp:  98.8 F (37.1 C)  TempSrc:  Oral  SpO2: 98% 98%      Constitutional: Alert and awake.  Oriented only to person.  Not to place or time . Not in any apparent distress HEENT:      Head: Normocephalic and atraumatic.         Eyes: PERLA, EOMI, Conjunctivae are normal. Sclera is non-icteric.        Mouth/Throat: Mucous membranes are dry.       Neck: Supple with no signs of meningismus. Cardiovascular: Tachycardia. No murmurs, gallops, or rubs. 2+ symmetrical distal pulses are present . No JVD. No LE edema Respiratory: Respiratory effort normal .Lungs sounds clear bilaterally. No wheezes, crackles, or rhonchi.  Gastrointestinal: Soft, suprapubic tenderness, and non distended with positive bowel sounds.  Genitourinary: No CVA tenderness. Musculoskeletal: Nontender with normal range of motion in all extremities.  Wound VAC over left heel  Neurologic:  Face is symmetric. Moving all extremities. No gross focal neurologic deficits . Skin: Skin is warm, dry.  No rash or ulcers Psychiatric: Mood and affect are normal    Labs on Admission: I have personally reviewed following labs and imaging studies  CBC: Recent Labs  Lab 12/18/20 1022  WBC 9.5  NEUTROABS 5.9  HGB 11.4*  HCT 35.1*  MCV 92.4  PLT 878   Basic Metabolic Panel: Recent Labs  Lab 12/18/20 1022  NA 138  K 4.5  CL 106  CO2 24  GLUCOSE 120*  BUN 55*  CREATININE 2.92*  CALCIUM 9.1   GFR: Estimated Creatinine Clearance: 13.7 mL/min (A) (by C-G formula based on SCr of 2.92 mg/dL (H)). Liver Function Tests: Recent Labs  Lab 12/18/20 1022  AST 17  ALT 16  ALKPHOS 130*  BILITOT 0.8  PROT 6.3*  ALBUMIN 2.6*   No results for input(s): LIPASE, AMYLASE in the last 168 hours. No results for input(s): AMMONIA in the last 168 hours. Coagulation Profile: No results for input(s): INR, PROTIME in the last 168 hours. Cardiac Enzymes: No results for input(s): CKTOTAL, CKMB, CKMBINDEX, TROPONINI in the last  168 hours. BNP (last 3 results) No results for input(s): PROBNP in the last 8760 hours. HbA1C: No results for input(s): HGBA1C in the last 72 hours. CBG: No results for input(s): GLUCAP in the last 168 hours. Lipid Profile: No results for input(s): CHOL, HDL, LDLCALC, TRIG, CHOLHDL, LDLDIRECT in the last 72  hours. Thyroid Function Tests: No results for input(s): TSH, T4TOTAL, FREET4, T3FREE, THYROIDAB in the last 72 hours. Anemia Panel: No results for input(s): VITAMINB12, FOLATE, FERRITIN, TIBC, IRON, RETICCTPCT in the last 72 hours. Urine analysis:    Component Value Date/Time   COLORURINE STRAW (A) 08/12/2020 1234   APPEARANCEUR CLEAR (A) 08/12/2020 1234   APPEARANCEUR Clear 01/16/2014 1530   LABSPEC 1.006 08/12/2020 1234   LABSPEC 1.009 01/16/2014 1530   PHURINE 6.0 08/12/2020 1234   GLUCOSEU NEGATIVE 08/12/2020 1234   GLUCOSEU Negative 01/16/2014 1530   HGBUR NEGATIVE 08/12/2020 1234   BILIRUBINUR NEGATIVE 08/12/2020 1234   BILIRUBINUR Negative 12/16/2016 1519   BILIRUBINUR Negative 01/16/2014 1530   KETONESUR NEGATIVE 08/12/2020 1234   PROTEINUR NEGATIVE 08/12/2020 1234   UROBILINOGEN 0.2 12/16/2016 1519   NITRITE NEGATIVE 08/12/2020 1234   LEUKOCYTESUR TRACE (A) 08/12/2020 1234   LEUKOCYTESUR Negative 01/16/2014 1530    Radiological Exams on Admission: CT Head Wo Contrast  Result Date: 12/18/2020 CLINICAL DATA:  Mental status change, unknown cause. EXAM: CT HEAD WITHOUT CONTRAST TECHNIQUE: Contiguous axial images were obtained from the base of the skull through the vertex without intravenous contrast. COMPARISON:  Prior head CT 11/15/2019. FINDINGS: Brain: Mild generalized cerebral atrophy. 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 There is no acute intracranial hemorrhage. No demarcated cortical infarct. No extra-axial fluid collection. No evidence of an intracranial mass. No midline shift. Partially empty sella turcica. Vascular: No hyperdense vessel.  Atherosclerotic calcifications. Skull: Normal. Negative for fracture or focal lesion. Sinuses/Orbits: Visualized orbits show no acute finding. Postsurgical appearance of the paranasal sinuses. Trace scattered paranasal sinus mucosal thickening. IMPRESSION: No evidence of  acute intracranial abnormality. Redemonstrated chronic lacunar infarct within the left cerebellar hemisphere. Mild generalized cerebral atrophy. Minimal paranasal sinus mucosal thickening. Electronically Signed   By: Kellie Simmering D.O.   On: 12/18/2020 11:13   DG Chest Portable 1 View  Result Date: 12/18/2020 CLINICAL DATA:  Altered mental status. Additional history provided: Altered mental status, history of CAD, CHF, COPD, diabetes mellitus, hypertension, myocardial infarction, pneumonia, right bundle branch block, sepsis. EXAM: PORTABLE CHEST 1 VIEW COMPARISON:  Prior chest radiographs 11/24/2020 and earlier. FINDINGS: Right-sided PICC with tip projecting at the level of the lower SVC/superior cavoatrial junction. Mild cardiomegaly, unchanged. Aortic atherosclerosis. Central pulmonary vascular congestion without frank pulmonary edema. Minimal atelectasis within the left lung base. No appreciable airspace consolidation. No evidence of pleural effusion or pneumothorax. No acute bony abnormality identified. IMPRESSION: Mild cardiomegaly, unchanged. Central pulmonary vascular congestion without overt pulmonary edema. Minimal atelectasis within the left lung base. Aortic Atherosclerosis (ICD10-I70.0). Electronically Signed   By: Kellie Simmering D.O.   On: 12/18/2020 11:07     Assessment/Plan Principal Problem:   Acute metabolic encephalopathy Active Problems:   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 edema, with long-term current use of insulin (HCC)   AKI (acute kidney injury) ([place])   Delirium   Chronic systolic CHF (congestive heart failure) ([place])      Patient is a 75 year old female who presents to the emergency room for evaluation of mental status changes which  include confusion and hallucination.  She is found to have acute renal failure.      Acute metabolic encephalopathy Rule out delirium Probably medication induced versus  from an infectious process as well as AKI Awaiting results of UA Patient has been on cefepime for left heel osteomyelitis Supportive care We will request ID consult for recommendation to switch antibiotic therapy if indicated     Acute kidney injury Most likely prerenal and related to poor oral intake with concomitant use of diuretic therapy Hold Lasix and losartan IV fluid hydration Obtain renal ultrasound to rule out obstructive uropathy Repeat renal parameters in a.m.     Insulin-dependent diabetes mellitus Hold Lantus and Trulicity due to poor oral intake Blood sugar checks every 4 hours      Chronic systolic CHF Not acutely exacerbated Last known 2D echocardiogram shows an LVEF of 40 to 45% Hold furosemide and Cozaar due to AKI Continue metoprolol     Left heel osteomyelitis Patient has a wound VAC in place Will resume daptomycin and cefepime but will consult ID for further recommendation We will request wound care consult    Depression Continue Zoloft   DVT prophylaxis: Heparin Code Status: full code  Family Communication: Greater than 50% of time was spent discussing patient's condition and plan of care with her daughter and healthcare power of attorney [name] at the bedside.  All questions and concerns have been addressed.  CODE STATUS was discussed and patient is a full code. Disposition Plan: Back to previous home environment Consults called: Infectious disease Status:At the time of admission, it appears that the appropriate admission status for this patient is inpatient. This is judged to be reasonable and necessary to provide the required intensity of service to ensure the patient's safety given the presenting symptoms, physical exam findings, and initial radiographic and laboratory data in the context of their comorbid conditions. Patient requires inpatient status due to high intensity of service, high risk for further deterioration and high frequency  of surveillance required.     Collier Bullock MD [facility]     12/18/2020, 1:28 PM

## 2020-12-18 NOTE — Progress Notes (Signed)
PHARMACY NOTE:  ANTIMICROBIAL RENAL DOSAGE ADJUSTMENT  Current antimicrobial regimen includes a mismatch between antimicrobial dosage and estimated renal function.  As per policy approved by the Pharmacy & Therapeutics and Medical Executive Committees, the antimicrobial dosage will be adjusted accordingly.  Current antimicrobial dosage:  cefepime 2 g IV q12h + daptomycin 400 mg IV q24h  Indication: osteomyelitis  Renal Function:  Estimated Creatinine Clearance: 13.7 mL/min (A) (by C-G formula based on SCr of 2.92 mg/dL (H)).     Antimicrobial dosage has been changed to:  cefepime 2 g IV q24h + daptomycin 400 mg IV q48h  Additional comments: Antibiotics continued from PTA. She was originally scheduled to receive through 10/23 for osteomyelitis, she has been followed by ID. CK added to labs.   Thank you for allowing pharmacy to be a part of this patient's care.  Tawnya Crook, PharmD, BCPS Clinical Pharmacist 12/18/2020 1:55 PM

## 2020-12-19 ENCOUNTER — Inpatient Hospital Stay: Payer: Medicare Other

## 2020-12-19 DIAGNOSIS — I5022 Chronic systolic (congestive) heart failure: Secondary | ICD-10-CM

## 2020-12-19 DIAGNOSIS — F32A Depression, unspecified: Secondary | ICD-10-CM

## 2020-12-19 DIAGNOSIS — N179 Acute kidney failure, unspecified: Principal | ICD-10-CM

## 2020-12-19 DIAGNOSIS — M86172 Other acute osteomyelitis, left ankle and foot: Secondary | ICD-10-CM

## 2020-12-19 DIAGNOSIS — E1169 Type 2 diabetes mellitus with other specified complication: Secondary | ICD-10-CM | POA: Diagnosis not present

## 2020-12-19 DIAGNOSIS — G9341 Metabolic encephalopathy: Secondary | ICD-10-CM | POA: Diagnosis not present

## 2020-12-19 DIAGNOSIS — N189 Chronic kidney disease, unspecified: Secondary | ICD-10-CM

## 2020-12-19 DIAGNOSIS — E785 Hyperlipidemia, unspecified: Secondary | ICD-10-CM

## 2020-12-19 LAB — BASIC METABOLIC PANEL
Anion gap: 7 (ref 5–15)
BUN: 48 mg/dL — ABNORMAL HIGH (ref 8–23)
CO2: 21 mmol/L — ABNORMAL LOW (ref 22–32)
Calcium: 8.6 mg/dL — ABNORMAL LOW (ref 8.9–10.3)
Chloride: 113 mmol/L — ABNORMAL HIGH (ref 98–111)
Creatinine, Ser: 2.25 mg/dL — ABNORMAL HIGH (ref 0.44–1.00)
GFR, Estimated: 22 mL/min — ABNORMAL LOW (ref >60)
Glucose, Bld: 87 mg/dL (ref 70–99)
Potassium: 3.5 mmol/L (ref 3.5–5.1)
Sodium: 141 mmol/L (ref 135–145)

## 2020-12-19 LAB — GLUCOSE, CAPILLARY
Glucose-Capillary: 90 mg/dL (ref 70–99)
Glucose-Capillary: 84 mg/dL (ref 70–99)
Glucose-Capillary: 85 mg/dL (ref 70–99)
Glucose-Capillary: 80 mg/dL (ref 70–99)
Glucose-Capillary: 175 mg/dL — ABNORMAL HIGH (ref 70–99)
Glucose-Capillary: 151 mg/dL — ABNORMAL HIGH (ref 70–99)

## 2020-12-19 LAB — CBC
HCT: 31.8 % — ABNORMAL LOW (ref 36.0–46.0)
Hemoglobin: 10.4 g/dL — ABNORMAL LOW (ref 12.0–15.0)
MCH: 30.1 pg (ref 26.0–34.0)
MCHC: 32.7 g/dL (ref 30.0–36.0)
MCV: 91.9 fL (ref 80.0–100.0)
Platelets: 189 10*3/uL (ref 150–400)
RBC: 3.46 MIL/uL — ABNORMAL LOW (ref 3.87–5.11)
RDW: 17.4 % — ABNORMAL HIGH (ref 11.5–15.5)
WBC: 7 10*3/uL (ref 4.0–10.5)
nRBC: 0 % (ref 0.0–0.2)

## 2020-12-19 MED ORDER — METOPROLOL TARTRATE 5 MG/5ML IV SOLN: 5.0000 mg | INTRAVENOUS | Status: DC | PRN

## 2020-12-19 MED ORDER — MORPHINE SULFATE (PF) 2 MG/ML IV SOLN
2.0000 mg | INTRAVENOUS | Status: DC | PRN
  Administered 2020-12-19 – 2020-12-21 (×8): 2 mg via INTRAVENOUS
  Filled 2020-12-19 (×8): qty 1

## 2020-12-19 MED ORDER — DEXTROSE 50 % IV SOLN
1.0000 | Freq: Once | INTRAVENOUS | Status: AC
  Administered 2020-12-19: 50 mL via INTRAVENOUS
  Filled 2020-12-19: qty 50

## 2020-12-19 MED ORDER — DIAZEPAM 5 MG/ML IJ SOLN
2.5000 mg | Freq: Once | INTRAMUSCULAR | Status: AC
  Administered 2020-12-19: 2.5 mg via INTRAVENOUS
  Filled 2020-12-19: qty 2

## 2020-12-19 MED ORDER — DEXTROSE-NACL 5-0.9 % IV SOLN: INTRAVENOUS | Status: DC

## 2020-12-19 NOTE — Evaluation (Signed)
Clinical/Bedside Swallow Evaluation Patient Details  Name: Andrea [name] MRN: [id number] Date of Birth: Dec 31, 1945  Today's Date: 12/19/2020 Time: SLP Start Time (ACUTE ONLY): 0930 SLP Stop Time (ACUTE ONLY): 1030 SLP Time Calculation (min) (ACUTE ONLY): 60 min  Past Medical History:  Past Medical History:  Diagnosis Date   Anxiety    Arthritis    CAD (coronary artery disease)    Chickenpox    Chronic systolic heart failure ([place])    COPD (chronic obstructive pulmonary disease) (HCC)    mild-no inhalers   Coronary artery disease    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   Depression    Diabetes mellitus type 2, uncomplicated ([place])    Diabetic retinopathy ([place])    Dupuytren contracture 2011   s/p surgery to LEFT hand   Frequent urinary tract infections    GERD (gastroesophageal reflux disease)    Grade I diastolic dysfunction    HTN (hypertension)    Hyperlipidemia, unspecified    Irritable bowel syndrome    Meningitis 11/2019   MI (myocardial infarction) ([place]) 1994   no stent   Neuromuscular disorder (HCC)    nerve pain in back and legs   Osteoporosis    Pneumonia 2021   RBBB    Sepsis ([place]) 11/2019   Skin cancer    TIA (transient ischemic attack) 2014   Vitamin D deficiency, unspecified    Wheezing    Past Surgical History:  Past Surgical History:  Procedure Laterality Date   BACK SURGERY     lower due to spinal stenosis   BONE BIOPSY Left 11/20/2020   Procedure: BONE BIOPSY;  Surgeon: Edrick Kins, DPM;  Location: [facility] OR[facility];  Service: Podiatry;  Laterality: Left;   CARDIAC CATHETERIZATION  1994   CATARACT EXTRACTION, BILATERAL Bilateral    COLONOSCOPY     x3   COLONOSCOPY WITH PROPOFOL N/A 02/08/2019   Procedure: COLONOSCOPY WITH PROPOFOL;  Surgeon: [name], MD;  Location: [facility];  Service: Gastroenterology;  Laterality: N/A;   CORONARY ANGIOPLASTY  1994   DUPUYTREN CONTRACTURE RELEASE Left 2011   HARDWARE REMOVAL Left 08/19/2020   Procedure:  HARDWARE REMOVAL;  Surgeon: Hessie Knows, MD;  Location: [facility] OR[facility];  Service: Orthopedics;  Laterality: Left;   HIP FRACTURE SURGERY  11/2013   IRRIGATION AND DEBRIDEMENT FOOT Left 11/20/2020   Procedure: IRRIGATION AND DEBRIDEMENT FOOT-Heel Ulcer;  Surgeon: Edrick Kins, DPM;  Location: [facility] OR[facility];  Service: Podiatry;  Laterality: Left;   PR COLSC FLX W/REMOVAL LESION BY HOT BX FORCEPS   08/21/2015   Procedure: COLONOSCOPY, FLEXIBLE, PROXIMAL TO SPLENIC FLEXURE; W/REMOVAL TUMOR/POLYP/OTHER LESION, HOT BX FORCEP/CAUTE; Surgeon: Carlena Hurl, MD; Location: [facility]; Service: General Surgery   REPAIR DURAL / CSF LEAK     to left sinus   TEE WITHOUT CARDIOVERSION N/A 11/20/2019   Procedure: TRANSESOPHAGEAL ECHOCARDIOGRAM (TEE);  Surgeon: Nelva Bush, MD;  Location: [facility] OR[facility];  Service: Cardiovascular;  Laterality: N/A;   TOTAL HIP ARTHROPLASTY Left 08/19/2020   Procedure: TOTAL HIP ARTHROPLASTY ANTERIOR APPROACH;  Surgeon: Hessie Knows, MD;  Location: [facility] OR[facility];  Service: Orthopedics;  Laterality: Left;   HPI:  Pt is a 75 y.o. female admitted from [place] with confusion of one days duration and found to have ARF. She has been on cefepime and daptomycin for a heel osteomyelitis and has a wound vac in place. Daughter says she was doing well until last 1 day when she started to not recognize staff at the facility and  was not eating or drinking.   She was admitted to [facility] between 11/17/2020 until 12/01/2020 for the L heel osteo and infection which occurred after she had left hip replacement on 08/19/2020.  PMH:  multiple medical dxs including GERD, COPD, She also has  a history of diabetes mellitus, recent left hip replacement, hypertension, lipidemia, CAD, TIA, history of CSF rhinorrhea, strep pneumo meningitis in September 2021 followed by surgery to correct the CSF rhinorrhea.  CXR: Mild cardiomegaly, unchanged. Central pulmonary vascular congestion without overt pulmonary edema.     Minimal  atelectasis within the left lung base.     Aortic Atherosclerosis.  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 No acute intracranial hemorrhage.    Assessment / Plan / Recommendation  Clinical Impression  Pt appears to present w/ declined Mental Status w/ confusion during the Oral Prep stage of swallowing. She also exhibited decreased awareness of po tasks despite verbal/tactile/visual MOD+ cues by this Clinician and Dtr present at bedside. Pt consistently tightend lips to presentation of bolus at lips and refused d/t declined Mental Status. W/ continued po trials, and even attempts at oral care, pt appeared min agitated turning head away. PO trial attempts for assessment were stopped to avoid negative associations and discomfort/agitation.  Much Time was then spent w/ Dtr on instructions for oral care for hygiene and stimulation of swallowing; promoting acceptance of activities using face washing as well as oral care. Encouraged hand-over-hand activities together to engage pt for Cognitive awareness. Instructed that IF pt was able to be comfortable and allow/engage in oral care, then Single ice chips (therapeutic and Pleasure) could be attempted w/ Strict aspiration precautions -- Stopping if any overt s/s of aspiration were noted. Dtr was able to repeat back instructions; stated understanding. This information was posted in room for her. Supplies given.     Pt is at increased risk for aspiration at this time d/t declined Mental Status. This can impact her overall awareness/timing of swallow and safety during any po tasks which increases risk for aspiration, choking.  Recommend continued ST f/u for ongoing assessment and po trials to establish least restrictive oral diet next 1-3 days. Dtr agreed. NSG updated. SLP Visit Diagnosis: Dysphagia, oropharyngeal phase (R13.12) (Mental status decline)    Aspiration Risk  Moderate aspiration risk;Risk for  inadequate nutrition/hydration    Diet Recommendation   NPO w/ oral care and PRN single ice chips for pleasure/therapeutic purposes IF appropriate; aspiration precautions. 100% supervision.  Medication Administration: Via alternative means    Other  Recommendations Recommended Consults:  (Dietician f/u) Oral Care Recommendations: Oral care QID;Oral care prior to ice chip/H20 Other Recommendations:  (TBD)    Recommendations for follow up therapy are one component of a multi-disciplinary discharge planning process, led by the attending physician.  Recommendations may be updated based on patient status, additional functional criteria and insurance authorization.  Follow up Recommendations Skilled Nursing facility (TBD)      Frequency and Duration min 3x week  2 weeks       Prognosis Prognosis for Safe Diet Advancement: Fair Barriers to Reach Goals: Cognitive deficits;Time post onset;Severity of deficits;Behavior      Swallow Study   General Date of Onset: 12/18/20 HPI: Pt is a 75 y.o. female admitted from [place] with confusion of one days duration and found to have ARF. She has been on cefepime and daptomycin for a heel osteomyelitis and has a wound vac in place. Daughter says she was  doing well until last 1 day when she started to not recognize staff at the facility and was not eating or drinking.   She was admitted to [facility] between 11/17/2020 until 12/01/2020 for the L heel osteo and infection which occurred after she had left hip replacement on 08/19/2020.  PMH:  multiple medical dxs including GERD, COPD, She also has  a history of diabetes mellitus, recent left hip replacement, hypertension, lipidemia, CAD, TIA, history of CSF rhinorrhea, strep pneumo meningitis in September 2021 followed by surgery to correct the CSF rhinorrhea.  CXR: Mild cardiomegaly, unchanged. Central pulmonary vascular congestion without overt pulmonary edema.     Minimal atelectasis within the left lung base.      Aortic Atherosclerosis.  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 No acute intracranial hemorrhage. Type of Study: Bedside Swallow Evaluation Previous Swallow Assessment: none Diet Prior to this Study: NPO (regular diet prior) Temperature Spikes Noted: No (wbc 7.0) Respiratory Status: Room air History of Recent Intubation: No Behavior/Cognition: Pleasant mood;Confused;Requires cueing;Doesn't follow directions (Awake) Oral Cavity Assessment:  (could not assess d/t mental status; dry lips) Oral Care Completed by SLP:  (attempted - could not d/t mental status) Oral Cavity - Dentition: Adequate natural dentition Vision:  (n/a) Self-Feeding Abilities: Total assist Patient Positioning: Upright in bed (needed positioning) Baseline Vocal Quality:  (nonverbal -- whispered x2) Volitional Cough: Cognitively unable to elicit Volitional Swallow: Unable to elicit    Oral/Motor/Sensory Function Overall Oral Motor/Sensory Function:  (could not assess d/t mental status)   Ice Chips Ice chips: Impaired Other Comments: pt tightend lips and refused d/t mental status   Thin Liquid Thin Liquid: Not tested    Nectar Thick Nectar Thick Liquid: Not tested   Honey Thick Honey Thick Liquid: Not tested   Puree Puree: Impaired Other Comments: pt tightend lips and refused d/t mental status   Solid     Solid: Not tested        Orinda Kenner, MS, CCC-SLP Speech Language Pathologist Rehab Services [phone] Jacarri Gesner 12/19/2020,2:39 PM

## 2020-12-19 NOTE — Progress Notes (Signed)
[facility] INFECTI[facility] PROGRESS NOTE Date of Admission:  12/18/2020     ID: KERLINE TRAHAN is a 75 y.o. female with  AMS, heel osteo, UTI Principal Problem:   Acute metabolic encephalopathy Active Problems:   Type 2 diabetes mellitus with hyperlipidemia (HCC)   Acute kidney injury superimposed on CKD (HCC)   Delirium   Chronic systolic CHF (congestive heart failure) (HCC)   Osteomyelitis of foot, left, acute (HCC)   Depression   Subjective: Remains confused, no fevers wbc nml.  BCX UCX pending  ROS unable to obtain  Medications:  Antibiotics Given (last 72 hours)     Date/Time Action Medication Dose Rate   12/18/20 2047 New Bag/Given   meropenem (MERREM) 1 g in sodium chloride 0.9 % 100 mL IVPB 1 g 200 mL/hr   12/19/20 1217 New Bag/Given   meropenem (MERREM) 1 g in sodium chloride 0.9 % 100 mL IVPB 1 g 200 mL/hr       heparin  5,000 Units Subcutaneous Q8H   multivitamin-lutein  1 capsule Oral BID   sertraline  25 mg Oral Daily    Objective: Vital signs in last 24 hours: Temp:  [97.6 F (36.4 C)-98.9 F (37.2 C)] 98.9 F (37.2 C) (10/14 1124) Pulse Rate:  [83-108] 108 (10/14 1124) Resp:  [11-21] 18 (10/14 1124) BP: (94-144)/(39-103) 133/55 (10/14 1124) SpO2:  [76 %-100 %] 100 % (10/14 1124) Weight:  [62.5 kg] 62.5 kg (10/14 0150) Constitutional:  confused, but in no distress HENT: [place]/AT, PERRLA, no scleral icterus Mouth/Throat: Oropharynx is clear and moist. Cardiovascular: Normal rate, regular rhythm and normal heart sounds. Exam reveals no gallop and no friction rub.  No murmur heard.  Pulmonary/Chest: Effort normal and breath sounds normal. No respiratory distress.  has no wheezes.  Neck = supple, no nuchal rigidity Abdominal: Soft. Bowel sounds are normal.  exhibits no distension. There is no tenderness.  Lymphadenopathy: no cervical adenopathy. No axillary adenopathy Neurological: confused Skin: Skin is warm and dry. L heel with wound vac in  place Psychiatric: confused  Lab Results Recent Labs    12/18/20 1022 12/19/20 0545  WBC 9.5 7.0  HGB 11.4* 10.4*  HCT 35.1* 31.8*  NA 138 141  K 4.5 3.5  CL 106 113*  CO2 24 21*  BUN 55* 48*  CREATININE 2.92* 2.25*    Microbiology: Results for orders placed or performed during the hospital encounter of 12/18/20  Culture, blood (routine x 2)     Status: None (Preliminary result)   Collection Time: 12/18/20 10:22 AM   Specimen: BLOOD  Result Value Ref Range Status   Specimen Description BLOOD LEFT FA  Final   Special Requests   Final    BOTTLES DRAWN AEROBIC AND ANAEROBIC Blood Culture adequate volume   Culture   Final    NO GROWTH < 24 HOURS Performed at [facility], [address], [place], [place] [address]    Report Status PENDING  Incomplete  Resp Panel by RT-PCR (Flu A&B, Covid) Nasopharyngeal Swab     Status: None   Collection Time: 12/18/20 10:24 AM   Specimen: Nasopharyngeal Swab; Nasopharyngeal(NP) swabs in vial transport medium  Result Value Ref Range Status   SARS Coronavirus 2 by RT PCR NEGATIVE NEGATIVE Final    Comment: (NOTE) SARS-CoV-2 target nucleic acids are NOT DETECTED.  The SARS-CoV-2 RNA is generally detectable in upper respiratory specimens during the acute phase of infection. The lowest concentration of SARS-CoV-2 viral copies this assay can  detect is 138 copies/mL. A negative result does not preclude SARS-Cov-2 infection and should not be used as the sole basis for treatment or other patient management decisions. A negative result may occur with  improper specimen collection/handling, submission of specimen other than nasopharyngeal swab, presence of viral mutation(s) within the areas targeted by this assay, and inadequate number of viral copies(<138 copies/mL). A negative result must be combined with clinical observations, patient history, and epidemiological information. The expected result is Negative.  Fact Sheet for  Patients:  EntrepreneurPulse.com.au  Fact Sheet for Healthcare Providers:  IncredibleEmployment.be  This test is no t yet approved or cleared by the [place] FDA and  has been authorized for detection and/or diagnosis of SARS-CoV-2 by FDA under an Emergency Use Authorization (EUA). This EUA will remain  in effect (meaning this test can be used) for the duration of the COVID-19 declaration under Section 564(b)(1) of the Act, 21 U.S.C.section 360bbb-3(b)(1), unless the authorization is terminated  or revoked sooner.       Influenza A by PCR NEGATIVE NEGATIVE Final   Influenza B by PCR NEGATIVE NEGATIVE Final    Comment: (NOTE) The Xpert Xpress SARS-CoV-2/FLU/RSV plus assay is intended as an aid in the diagnosis of influenza from Nasopharyngeal swab specimens and should not be used as a sole basis for treatment. Nasal washings and aspirates are unacceptable for Xpert Xpress SARS-CoV-2/FLU/RSV testing.  Fact Sheet for Patients: EntrepreneurPulse.com.au  Fact Sheet for Healthcare Providers: IncredibleEmployment.be  This test is not yet approved or cleared by the [place] FDA and has been authorized for detection and/or diagnosis of SARS-CoV-2 by FDA under an Emergency Use Authorization (EUA). This EUA will remain in effect (meaning this test can be used) for the duration of the COVID-19 declaration under Section 564(b)(1) of the Act, 21 U.S.C. section 360bbb-3(b)(1), unless the authorization is terminated or revoked.  Performed at [facility], [address] [place], [place] [address]   Culture, blood (routine x 2)     Status: None (Preliminary result)   Collection Time: 12/18/20 11:06 AM   Specimen: BLOOD  Result Value Ref Range Status   Specimen Description BLOOD LEFT WRIST  Final   Special Requests   Final    BOTTLES DRAWN AEROBIC AND ANAEROBIC Blood Culture adequate volume    Culture   Final    NO GROWTH < 24 HOURS Performed at [facility], [address] [place], [place] [address]    Report Status PENDING  Incomplete    Studies/Results: CT Head Wo Contrast  Result Date: 12/18/2020 CLINICAL DATA:  Mental status change, unknown cause. EXAM: CT HEAD WITHOUT CONTRAST TECHNIQUE: Contiguous axial images were obtained from the base of the skull through the vertex without intravenous contrast. COMPARISON:  Prior head CT 11/15/2019. FINDINGS: Brain: Mild generalized cerebral atrophy. 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 There is no acute intracranial hemorrhage. No demarcated cortical infarct. No extra-axial fluid collection. No evidence of an intracranial mass. No midline shift. Partially empty sella turcica. Vascular: No hyperdense vessel.  Atherosclerotic calcifications. Skull: Normal. Negative for fracture or focal lesion. Sinuses/Orbits: Visualized orbits show no acute finding. Postsurgical appearance of the paranasal sinuses. Trace scattered paranasal sinus mucosal thickening. IMPRESSION: No evidence of acute intracranial abnormality. Redemonstrated chronic lacunar infarct within the left cerebellar hemisphere. Mild generalized cerebral atrophy. Minimal paranasal sinus mucosal thickening. Electronically Signed   By: Kellie Simmering D.O.   On: 12/18/2020 11:13   US RENAL  Result Date: 12/18/2020 CLINICAL DATA:  Acute renal insufficiency EXAM: RENAL / URINARY TRACT ULTRASOUND COMPLETE COMPARISON:  None. FINDINGS: Right Kidney: Renal measurements: 10.8 x 5.7 x 4.8 cm = volume: 153 mL. Normal renal cortical echotexture with mild cortical thinning. Simple appearing cyst within the lower pole measures up to 1.7 cm. No hydronephrosis. Left Kidney: Renal measurements: 10.5 x 4.8 x 4.5 cm = volume: 118 mL. Echogenicity within normal limits. Mild renal cortical thinning. Simple cyst lower pole measuring up to 1.1  cm. No hydronephrosis. Bladder: Appears normal for degree of bladder distention. Other: None. IMPRESSION: 1. Mild bilateral renal cortical thinning. Otherwise normal echotexture. 2. Small bilateral simple renal cysts. Electronically Signed   By: Randa Ngo M.D.   On: 12/18/2020 15:53   DG Chest Portable 1 View  Result Date: 12/18/2020 CLINICAL DATA:  Altered mental status. Additional history provided: Altered mental status, history of CAD, CHF, COPD, diabetes mellitus, hypertension, myocardial infarction, pneumonia, right bundle branch block, sepsis. EXAM: PORTABLE CHEST 1 VIEW COMPARISON:  Prior chest radiographs 11/24/2020 and earlier. FINDINGS: Right-sided PICC with tip projecting at the level of the lower SVC/superior cavoatrial junction. Mild cardiomegaly, unchanged. Aortic atherosclerosis. Central pulmonary vascular congestion without frank pulmonary edema. Minimal atelectasis within the left lung base. No appreciable airspace consolidation. No evidence of pleural effusion or pneumothorax. No acute bony abnormality identified. IMPRESSION: Mild cardiomegaly, unchanged. Central pulmonary vascular congestion without overt pulmonary edema. Minimal atelectasis within the left lung base. Aortic Atherosclerosis (ICD10-I70.0). Electronically Signed   By: Kellie Simmering D.O.   On: 12/18/2020 11:07    Assessment/Plan: DESTINY HAGIN is a 75 y.o. female with DM being treated for a L heel ulcer and osteo with cefepime and daptonmycin who was doing well at [facility] but now admitted with AMS x 1 day. ARF, positive UA. She likely has UTI leading to decreased PO intake and ARF.  She has some myoclonus on exam so possibly cefepime neurotoxicity related to ARF and elevated levels.    Recommendations Cong dapto- dose reduced to q 48 UA + - seems to have a UTI despite being on cefepime - will empirically cont  meropenem for now. Continue wound vac Thank you very much for the consult. Will follow with you.  Leonel Ramsay   12/19/2020, 1:05 PM

## 2020-12-19 NOTE — Consult Note (Signed)
[facility] Nurse Consult Note: Patient receiving care in [facility]. Daughter, [name], present in room. Reason for Consult: Wound VAC management Wound type: surgical wound to right posterior heel Pressure Injury POA: Yes/No/NA Measurement: 2 cm x 0.8 cm x 0.8cm Wound bed: pink Drainage (amount, consistency, odor) light, thin tan in existing portable VAC Periwound: intact Dressing procedure/placement/frequency: One piece of black foam removed from wound bed without difficulty. One piece placed. Drape applied, immediate seal obtained.  Daughter, [name], was given facility portable VAC machine.  I also explained to her that our dressing and tubing will not work with their product. I have placed an order to place a saline moistened gauze in the wound when she is discharged.  Val Riles, RN, MSN, CWOCN, CNS-BC, pager [phone]

## 2020-12-19 NOTE — NC FL2 (Signed)
[place] LEVEL OF CARE SCREENING TOOL     IDENTIFICATION  Patient Name: Andrea [name] Birthdate: 1945/11/27 Sex: female Admission Date (Current Location): 12/18/2020  [facility] and [place] Number:  Engineering geologist and Address:  [facility], [address] [place], [place] [address]      Provider Number: [id number]  Attending Physician Name and Address:  Loletha Grayer, MD  Relative Name and Phone Number:  Darlina Sicilian (Daughter)   [phone] [facility] Phone)    Current Level of Care: Hospital Recommended Level of Care: [place] Prior Approval Number:    Date Approved/Denied:   PASRR Number: [id number]  Discharge Plan: SNF    Current Diagnoses: Patient Active Problem List   Diagnosis Date Noted   Depression    Acute metabolic encephalopathy 02/58/5277   Acute kidney injury superimposed on CKD ([place]) 12/18/2020   Delirium 82/42/3536   Chronic systolic CHF (congestive heart failure) ([place]) 12/18/2020   Osteomyelitis of foot, left, acute ([place]) 12/18/2020   Status post peripherally inserted central catheter (PICC) central line placement    Osteomyelitis ([place]) 11/17/2020   Cellulitis of left lower extremity 11/13/2020   Hypoglycemia 11/13/2020   Non healing left heel wound 11/13/2020   Status post total hip replacement, 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   Pain due to onychomycosis of toenail of left foot 03/06/2020   Malaise and fatigue 12/28/2019   UTI (urinary tract infection) 12/28/2019   Hyponatremia 12/27/2019   Intertrigo 12/27/2019   Acute respiratory failure with hypoxia ([place]) 11/23/2019   History of meningitis 11/23/2019   CSF leak from nose 11/22/2019   History of myocardial infarction 03/09/2019   PAD (peripheral artery disease) ([place]) 12/28/2018   Moderate episode of recurrent major depressive disorder ([place]) 12/19/2017   Leg length discrepancy 09/21/2017   Coronary artery disease involving native coronary  artery 01/06/2017   Drug-induced constipation 01/06/2017   Spinal stenosis of lumbar region 01/06/2017   Spondylolisthesis of lumbar region 12/30/2016   Obese 04/16/2015   History of colon polyps 11/23/2012   Bundle branch block, 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   Personal history of transient ischemic attack (TIA), and cerebral infarction without residual deficits 11/23/2012   Encounter for long-term (current) use of aspirin 11/23/2012   Status post percutaneous transluminal coronary angioplasty 11/23/2012   Transient ischemic attack (TIA), and cerebral infarction without residual deficits(V12.54) 11/23/2012   Anemia 08/17/2012   Chronic obstructive pulmonary disease ([place]) 08/17/2012   HLD (hyperlipidemia) 08/17/2012   OP (osteoporosis) 08/17/2012   Type 2 diabetes mellitus with hyperlipidemia ([place]) 08/17/2012   Ischemic heart disease due to coronary artery obstruction ([place]) 07/24/2012    Orientation RESPIRATION BLADDER Height & Weight        Normal Incontinent Weight: 62.5 kg Height:  5' (152.4 cm)  BEHAVIORAL SYMPTOMS/MOOD NEUROLOGICAL BOWEL NUTRITION STATUS      Continent Diet (see discharge summary)  AMBULATORY STATUS COMMUNICATION OF NEEDS Skin   Extensive Assist Verbally Wound Vac, Surgical wounds, Other (Comment)                       Personal Care Assistance Level of Assistance  Bathing, Feeding, Dressing Bathing Assistance: Maximum assistance Feeding assistance: Limited assistance Dressing Assistance: Maximum assistance     Functional Limitations Info  Sight, Hearing, Speech Sight Info: Adequate Hearing Info: Adequate Speech Info: Adequate    SPECIAL CARE FACTORS FREQUENCY  PT (By licensed PT), OT (By licensed OT), Speech therapy     PT Frequency: 5  times per week OT Frequency: 5 times per week     Speech Therapy Frequency: eval at facility      Contractures Contractures Info: Not present    Additional Factors Info  Code Status, Allergies Code Status Info:  Full Code Allergies Info: Metformin and related, Sulfa antibiotics           Current Medications (12/19/2020):  This is the current hospital active medication list Current Facility-Administered Medications  Medication Dose Route Frequency Provider Last Rate Last Admin   acetaminophen (TYLENOL) tablet 650 mg  650 mg Oral Q6H PRN Agbata, Tochukwu, MD       Or   acetaminophen (TYLENOL) suppository 650 mg  650 mg Rectal Q6H PRN Agbata, Tochukwu, MD       DAPTOmycin (CUBICIN) 400 mg in sodium chloride 0.9 % IVPB  400 mg Intravenous Q48H Agbata, Tochukwu, MD       dextrose 5 %-0.9 % sodium chloride infusion   Intravenous Continuous Loletha Grayer, MD 100 mL/hr at 12/19/20 1254 New Bag at 12/19/20 1254   heparin injection 5,000 Units  5,000 Units Subcutaneous Q8H Agbata, Tochukwu, MD   5,000 Units at 12/19/20 0555   meropenem (MERREM) 1 g in sodium chloride 0.9 % 100 mL IVPB  1 g Intravenous Q12H Berton Mount, RPH 200 mL/hr at 12/19/20 1217 1 g at 12/19/20 1217   metoprolol tartrate (LOPRESSOR) injection 5 mg  5 mg Intravenous Q1H PRN Wieting, Richard, MD       morphine 2 MG/ML injection 2 mg  2 mg Intravenous Q4H PRN Wieting, Richard, MD       multivitamin-lutein (OCUVITE-LUTEIN) capsule 1 capsule  1 capsule Oral BID Agbata, Tochukwu, MD       ondansetron (ZOFRAN) injection 4 mg  4 mg Intravenous Q6H PRN Agbata, Tochukwu, MD       sertraline (ZOLOFT) tablet 25 mg  25 mg Oral Daily Agbata, Tochukwu, MD         Discharge Medications: Please see discharge summary for a list of discharge medications.  Relevant Imaging Results:  Relevant Lab Results:   Additional Information SSN [id number]  Shelbie Hutching, RN

## 2020-12-19 NOTE — Progress Notes (Signed)
Patient ID: Andrea [name], female   DOB: 08-Mar-1946, 75 y.o.   MRN: [id number] [facility] PROGRESS NOTE  Andrea [name] [id number]:[id number] DOB: [birth year] DOA: 12/18/2020 PCP: [name], FNP  HPI/Subjective: Patient unable to speak today.  As per admitting physician was unable to answer questions yesterday also.  Objective: Vitals:   12/19/20 0849 12/19/20 1124  BP: (!) 139/54 (!) 133/55  Pulse: (!) 106 (!) 108  Resp: 16 18  Temp: 98.6 F (37 C) 98.9 F (37.2 C)  SpO2: 100% 100%    Intake/Output Summary (Last 24 hours) at 12/19/2020 1236 Last data filed at 12/19/2020 0500 Gross per 24 hour  Intake 836.47 ml  Output 500 ml  Net 336.47 ml   Filed Weights   12/19/20 0150  Weight: 62.5 kg    ROS: Review of Systems  Unable to perform ROS: Acuity of condition  Exam: Physical Exam HENT:     Head: Normocephalic.     Mouth/Throat:     Comments: Unable to open mouth fully. Eyes:     General: Lids are normal.     Conjunctiva/sclera: Conjunctivae normal.  Cardiovascular:     Rate and Rhythm: Regular rhythm. Tachycardia present.     Heart sounds: Normal heart sounds, S1 normal and S2 normal.  Pulmonary:     Breath sounds: No decreased breath sounds, wheezing, rhonchi or rales.  Abdominal:     Palpations: Abdomen is soft.     Tenderness: There is no abdominal tenderness.  Musculoskeletal:     Right lower leg: No swelling.     Left lower leg: No swelling.  Skin:    General: Skin is warm.     Comments: Left heel wound VAC.  Neurological:     Mental Status: She is lethargic.     Comments: When I lifted up her arms off the bed she was able to hold them up for a second or 2 before her arms went down.  Unable to hold legs up off the bed after I picked them up.      Scheduled Meds:  heparin  5,000 Units Subcutaneous Q8H   multivitamin-lutein  1 capsule Oral BID   sertraline  25 mg Oral Daily   Continuous Infusions:  DAPTOmycin (CUBICIN)  IV     dextrose 5  % and 0.9% NaCl     meropenem (MERREM) IV 1 g (12/19/20 1217)    Assessment/Plan:  Acute metabolic encephalopathy.  Likely multifactorial in nature with infection, acute kidney injury and possibly relative hypoglycemia for this patient.  We will obtain an MRI of the brain to rule out stroke.  Continue IV fluid hydration.  We will push an amp of D50 and add D5 to IV fluids for now and see if that improves mental status.  Patient failed swallow evaluation currently.  NPO. Left heel osteomyelitis with Pseudomonas, acute cystitis without hematuria.  Infectious disease specialist and pharmacist adjusted the daptomycin dose.  Maxipime switched over to meropenem.  Follow-up urine culture results.  Wound care nurse evaluation for wound VAC. Type 2 diabetes mellitus with hyperlipidemia.  Patient has sugars in the 80s.  Continue to hold Lantus and Trulicity.  Recent hemoglobin A1c 7.5.  We will push an amp of D50 and add D5 to the IV fluids just in case this sugar of 80s is too low for her and causing her acute metabolic encephalopathy.  Holding Lipitor until able to swallow. Acute kidney injury on chronic kidney disease stage IIIa borderline stage  II.  Creatinine 2.92 on presentation down to 2.25 today.  Baseline creatinine ranging between 0.96 and 1.0. Chronic systolic congestive heart failure.  No signs of heart failure currently.  Last EF 40 to 45%.  Holding Lasix and Cozaar due to acute kidney injury.  Metoprolol on hold secondary to being n.p.o.  As needed IV metoprolol for increased heart rate or blood pressure. Depression.  Continue Zoloft once able to swallow.        Code Status:     Code Status Orders  (From admission, onward)           Start     Ordered   12/18/20 1305  Full code  Continuous        12/18/20 1307           Code Status History     Date Active Date Inactive Code Status Order ID Comments User Context   11/17/2020 2034 12/01/2020 2138 Full Code [id number]  Criss Alvine,  DO ED   08/19/2020 1711 08/27/2020 2305 Full Code [id number]  Hessie Knows, MD Inpatient   11/15/2019 2354 11/24/2019 2255 Full Code [id number]  Elwyn Reach, MD ED   12/30/2016 1848 01/05/2017 2200 Full Code [id number]  Newman Pies, MD Inpatient      Advance Directive Documentation    Flowsheet Row Most Recent Value  Type of Advance Directive Healthcare Power of Attorney  Pre-existing out of facility DNR order (yellow form or pink MOST form) --  "MOST" Form in Place? --      Family Communication: Spoke with family at the bedside Disposition Plan: Status is: Inpatient  Consultants: Infectious disease  Antibiotics: Daptomycin and meropenem  Time spent: 28 minutes, case discussed with nursing staff  Loletha Grayer  [facility]

## 2020-12-19 NOTE — Progress Notes (Signed)
Pharmacy Antibiotic Note  Andrea [name] is a 75 y.o. female admitted on 12/18/2020 with  calcaneal osteomyelitis  and possible UTI.  Pharmacy has been consulted for meropenem dosing. 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 Bone culture grew P. Aeruginosa. She presents with AKI and AMS 10/13.  Per recent ID clinic note does not appear she was eating well at SNF.     Plan: Continue meropenem 1 g IV q12h Continue daptomycin 400 mg IV q48h for CrCL < 30 ml/min  Temp (24hrs), Avg:98.3 F (36.8 C), Min:97.6 F (36.4 C), Max:98.9 F (37.2 C)  Recent Labs  Lab 12/18/20 1022 12/18/20 1224 12/19/20 0545  WBC 9.5  --  7.0  CREATININE 2.92*  --  2.25*  LATICACIDVEN 0.9 1.0  --      Estimated Creatinine Clearance: 17.8 mL/min (A) (by C-G formula based on SCr of 2.25 mg/dL (H)).    Allergies  Allergen Reactions   Chlorhexidine    Metformin And Related Diarrhea   Sulfa Antibiotics Rash    Antimicrobials this admission: PTA dapto/cefepime Meropenem 10/13 >>   Microbiology results: 10/13 BCx: no growth pending 10/13 UCx: pending   Thank you for allowing pharmacy to be a part of this patient's care.   Tawnya Crook, PharmD, BCPS Clinical Pharmacist 12/19/2020 2:44 PM

## 2020-12-19 NOTE — TOC Initial Note (Signed)
Transition of Care [facility]) - Initial/Assessment Note    Patient Details  Name: Andrea [name] MRN: [id number] Date of Birth: [birth year]  Transition of [facility]) CM/SW Contact:    Shelbie Hutching, RN Phone Number: 12/19/2020, 1:07 PM  Clinical Narrative:                 Patient admitted to the hospital with acute encephalopathy, history of osteomyelitis in left foot requiring IV antibiotics.  Patient came in from [facility] where she has been for short term rehab.  Patient's daughter is at bedside and reports that therapy and wound care were going very well at [facility] until this past Wed.  Staff noticed a change in patient and she was brought to the hospital yesterday.   RNCM met with patient and daughter at the bedside, daughter's goal would be for patient to return to [facility] when better and continue with rehab.   Patient requires further medical workup and TOC will follow patient progression.    Expected Discharge Plan: Skilled Nursing Facility Barriers to Discharge: Continued Medical Work up   Patient Goals and CMS Choice Patient states their goals for this hospitalization and ongoing recovery are:: Daughter wants to find out what is going on with the patient and ultimate goal for her to get better and return to [facility] for further rehab CMS Medicare.gov Compare Post Acute Care list provided to:: Patient Represenative (must comment) Choice offered to / list presented to : Adult Children  Expected Discharge Plan and Services Expected Discharge Plan: [place]   Discharge Planning Services: CM Consult Post Acute Care Choice: [place] Living arrangements for the past 2 months: [place], [place]                 DME Arranged: N/A DME Agency: NA       HH Arranged: NA HH Agency: NA        Prior Living Arrangements/Services Living arrangements for the past 2 months: [place], [place] Lives with::  Self, Facility Resident Patient language and need for interpreter reviewed:: Yes Do you feel safe going back to the place where you live?: Yes      Need for Family Participation in Patient Care: Yes (Comment) (foot wound) Care giver support system in place?: Yes (comment) (daughter) Current home services: DME Criminal Activity/Legal Involvement Pertinent to Current Situation/Hospitalization: No - Comment as needed  Activities of Daily Living Home Assistive Devices/Equipment: Wound Vac ADL Screening (condition at time of admission) Patient's cognitive ability adequate to safely complete daily activities?: No Is the patient deaf or have difficulty hearing?: No Does the patient have difficulty seeing, even when wearing glasses/contacts?: No Does the patient have difficulty concentrating, remembering, or making decisions?: Yes Patient able to express need for assistance with ADLs?: No Does the patient have difficulty dressing or bathing?: Yes Independently performs ADLs?: No Communication: Appropriate for developmental age Is this a change from baseline?: Pre-admission baseline Dressing (OT): Needs assistance Is this a change from baseline?: Pre-admission baseline Grooming: Needs assistance Is this a change from baseline?: Pre-admission baseline Feeding: Needs assistance Bathing: Needs assistance Toileting: Needs assistance Walks in Home: Needs assistance Does the patient have difficulty walking or climbing stairs?: Yes Weakness of Legs: Left Weakness of Arms/Hands: None  Permission Sought/Granted Permission sought to share information with : Case Manager, Family Supports, Customer service manager Permission granted to share information with : Yes, Verbal Permission Granted  Share Information with  NAME: Laury Axon  Permission granted to share info w AGENCY: [facility]  Permission granted to share info w Relationship: daughter  Permission granted to share info w Contact  Information: [phone]  Emotional Assessment Appearance:: Appears stated age Attitude/Demeanor/Rapport: Unable to Assess Affect (typically observed): Unable to Assess   Alcohol / Substance Use: Not Applicable Psych Involvement: No (comment)  Admission diagnosis:  AKI (acute kidney injury) ([place]) [N17.9] Altered mental status, unspecified altered mental status type [K09.38] Acute metabolic encephalopathy [H82.99] Patient Active Problem List   Diagnosis Date Noted   Depression    Acute metabolic encephalopathy 37/16/9678   Acute kidney injury superimposed on CKD ([place]) 12/18/2020   Delirium 93/81/0175   Chronic systolic CHF (congestive heart failure) ([place]) 12/18/2020   Osteomyelitis of foot, left, acute ([place]) 12/18/2020   Status post peripherally inserted central catheter (PICC) central line placement    Osteomyelitis ([place]) 11/17/2020   Cellulitis of left lower extremity 11/13/2020   Hypoglycemia 11/13/2020   Non healing left heel wound 11/13/2020   Status post total hip replacement, 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   Pain due to onychomycosis of toenail of left foot 03/06/2020   Malaise and fatigue 12/28/2019   UTI (urinary tract infection) 12/28/2019   Hyponatremia 12/27/2019   Intertrigo 12/27/2019   Acute respiratory failure with hypoxia ([place]) 11/23/2019   History of meningitis 11/23/2019   CSF leak from nose 11/22/2019   History of myocardial infarction 03/09/2019   PAD (peripheral artery disease) ([place]) 12/28/2018   Moderate episode of recurrent major depressive disorder ([place]) 12/19/2017   Leg length discrepancy 09/21/2017   Coronary artery disease involving native coronary artery 01/06/2017   Drug-induced constipation 01/06/2017   Spinal stenosis of lumbar region 01/06/2017   Spondylolisthesis of lumbar region 12/30/2016   Obese 04/16/2015   History of colon polyps 11/23/2012   Bundle branch block, 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   Personal history of transient ischemic attack (TIA), and cerebral  infarction without residual deficits 11/23/2012   Encounter for long-term (current) use of aspirin 11/23/2012   Status post percutaneous transluminal coronary angioplasty 11/23/2012   Transient ischemic attack (TIA), and cerebral infarction without residual deficits(V12.54) 11/23/2012   Anemia 08/17/2012   Chronic obstructive pulmonary disease ([place]) 08/17/2012   HLD (hyperlipidemia) 08/17/2012   OP (osteoporosis) 08/17/2012   Type 2 diabetes mellitus with hyperlipidemia ([place]) 08/17/2012   Ischemic heart disease due to coronary artery obstruction ([place]) 07/24/2012   PCP:  [name], FNP Pharmacy:   [place], [place] - [address] [place] [address] Phone: [phone] Fax: [phone]  [id number] [id number]: [phone]: [phone]    Social Determinants of Health (SDOH) Interventions    Readmission Risk Interventions Readmission Risk Prevention Plan 08/21/2020  Transportation Screening Complete  PCP or Specialist Appt within 5-7 Days Complete  Home Care Screening Complete  Medication Review (RN CM) Complete  Some recent data might be hidden

## 2020-12-20 DIAGNOSIS — G9341 Metabolic encephalopathy: Secondary | ICD-10-CM | POA: Diagnosis not present

## 2020-12-20 DIAGNOSIS — I10 Essential (primary) hypertension: Secondary | ICD-10-CM

## 2020-12-20 DIAGNOSIS — N179 Acute kidney failure, unspecified: Secondary | ICD-10-CM | POA: Diagnosis not present

## 2020-12-20 DIAGNOSIS — R000 Tachycardia, unspecified: Secondary | ICD-10-CM

## 2020-12-20 DIAGNOSIS — M86172 Other acute osteomyelitis, left ankle and foot: Secondary | ICD-10-CM | POA: Diagnosis not present

## 2020-12-20 DIAGNOSIS — I5022 Chronic systolic (congestive) heart failure: Secondary | ICD-10-CM | POA: Diagnosis not present

## 2020-12-20 LAB — BASIC METABOLIC PANEL
Anion gap: 6 (ref 5–15)
BUN: 39 mg/dL — ABNORMAL HIGH (ref 8–23)
CO2: 23 mmol/L (ref 22–32)
Calcium: 8.4 mg/dL — ABNORMAL LOW (ref 8.9–10.3)
Chloride: 115 mmol/L — ABNORMAL HIGH (ref 98–111)
Creatinine, Ser: 1.7 mg/dL — ABNORMAL HIGH (ref 0.44–1.00)
GFR, Estimated: 31 mL/min — ABNORMAL LOW (ref >60)
Glucose, Bld: 195 mg/dL — ABNORMAL HIGH (ref 70–99)
Potassium: 3 mmol/L — ABNORMAL LOW (ref 3.5–5.1)
Sodium: 144 mmol/L (ref 135–145)

## 2020-12-20 LAB — GLUCOSE, CAPILLARY
Glucose-Capillary: 135 mg/dL — ABNORMAL HIGH (ref 70–99)
Glucose-Capillary: 161 mg/dL — ABNORMAL HIGH (ref 70–99)
Glucose-Capillary: 167 mg/dL — ABNORMAL HIGH (ref 70–99)
Glucose-Capillary: 172 mg/dL — ABNORMAL HIGH (ref 70–99)
Glucose-Capillary: 217 mg/dL — ABNORMAL HIGH (ref 70–99)

## 2020-12-20 LAB — CBC
HCT: 32.7 % — ABNORMAL LOW (ref 36.0–46.0)
Hemoglobin: 10.5 g/dL — ABNORMAL LOW (ref 12.0–15.0)
MCH: 28.9 pg (ref 26.0–34.0)
MCHC: 32.1 g/dL (ref 30.0–36.0)
MCV: 90.1 fL (ref 80.0–100.0)
Platelets: 211 10*3/uL (ref 150–400)
RBC: 3.63 MIL/uL — ABNORMAL LOW (ref 3.87–5.11)
RDW: 17.7 % — ABNORMAL HIGH (ref 11.5–15.5)
WBC: 7.6 10*3/uL (ref 4.0–10.5)
nRBC: 0 % (ref 0.0–0.2)

## 2020-12-20 LAB — URINE CULTURE: Culture: 70000 — AB

## 2020-12-20 LAB — MAGNESIUM: Magnesium: 1.4 mg/dL — ABNORMAL LOW (ref 1.7–2.4)

## 2020-12-20 MED ORDER — DIPHENHYDRAMINE HCL 25 MG PO CAPS
25.0000 mg | ORAL_CAPSULE | Freq: Four times a day (QID) | ORAL | Status: DC | PRN
  Administered 2020-12-20: 25 mg via ORAL
  Filled 2020-12-20: qty 1

## 2020-12-20 MED ORDER — MAGNESIUM SULFATE 2 GM/50ML IV SOLN
2.0000 g | Freq: Once | INTRAVENOUS | Status: AC
  Administered 2020-12-20: 2 g via INTRAVENOUS
  Filled 2020-12-20: qty 50

## 2020-12-20 MED ORDER — KCL IN DEXTROSE-NACL 20-5-0.9 MEQ/L-%-% IV SOLN
INTRAVENOUS | Status: DC
  Filled 2020-12-20 (×2): qty 1000

## 2020-12-20 MED ORDER — METOPROLOL TARTRATE 25 MG PO TABS
12.5000 mg | ORAL_TABLET | Freq: Two times a day (BID) | ORAL | Status: DC
  Administered 2020-12-20 – 2020-12-22 (×5): 12.5 mg via ORAL
  Filled 2020-12-20 (×5): qty 1

## 2020-12-20 NOTE — Progress Notes (Signed)
PT Cancellation Note  Patient Details Name: Andrea [name] MRN: [id number] DOB: January 16, 1946   Cancelled Treatment:    Reason Eval/Treat Not Completed: Patient declined, no reason specified  Pt accompanied by family members and laying supine in room. Pt disoriented to time, place, and situation. Pt refusing sitting up at EOB or any assessment of her L foot. Confirmed with family that pt was NWB on L LE while at SNF prior to admission. May benefit from formal Podiatry consult to assess progression of WB status. Will re-attempt tomorrow.   [name], SPT   [name] 12/20/2020, 2:10 PM

## 2020-12-20 NOTE — Progress Notes (Addendum)
Patient ID: Andrea [name], female   DOB: [birth year], 75 y.o.   MRN: [id number] [facility] PROGRESS NOTE  Andrea [name] [id number]:[id number] DOB: 17-Jul-1945 DOA: 12/18/2020 PCP: [name], FNP  HPI/Subjective: Patient was yelling out and verbalizing more.  Once I was able to focus her she was able to answer few questions.  Admitted with acute metabolic encephalopathy.  Objective: Vitals:   12/20/20 0522 12/20/20 0745  BP: (!) 155/64 (!) 166/79  Pulse: 100 (!) 108  Resp: 16 17  Temp: 98 F (36.7 C) 98.7 F (37.1 C)  SpO2: 100% 99%    Intake/Output Summary (Last 24 hours) at 12/20/2020 1057 Last data filed at 12/20/2020 0600 Gross per 24 hour  Intake 2839.98 ml  Output 1600 ml  Net 1239.98 ml    Filed Weights   12/19/20 0150  Weight: 62.5 kg    ROS: Review of Systems  Unable to perform ROS: Acuity of condition  Respiratory:  Negative for shortness of breath.   Cardiovascular:  Negative for chest pain.  Gastrointestinal:  Negative for abdominal pain.  Exam: Physical Exam HENT:     Head: Normocephalic.     Mouth/Throat:     Pharynx: No oropharyngeal exudate.  Eyes:     General: Lids are normal.     Conjunctiva/sclera: Conjunctivae normal.  Cardiovascular:     Rate and Rhythm: Normal rate and regular rhythm.     Heart sounds: Normal heart sounds, S1 normal and S2 normal.  Pulmonary:     Breath sounds: Normal breath sounds. No decreased breath sounds, wheezing, rhonchi or rales.  Abdominal:     Palpations: Abdomen is soft.     Tenderness: There is no abdominal tenderness.  Musculoskeletal:     Right lower leg: No swelling.     Left lower leg: No swelling.  Neurological:     Mental Status: She is alert.     Comments: Yelling out for her daughter.  Once I was able to focus her she was able to answer a few questions and follow some simple commands including straight leg raise.      Scheduled Meds:  heparin  5,000 Units Subcutaneous Q8H    multivitamin-lutein  1 capsule Oral BID   sertraline  25 mg Oral Daily   Continuous Infusions:  DAPTOmycin (CUBICIN)  IV Stopped (12/19/20 2330)   dextrose 5 % and 0.9 % NaCl with KCl 20 mEq/L 60 mL/hr at 12/20/20 1027   magnesium sulfate bolus IVPB     meropenem (MERREM) IV 1 g (12/20/20 4008)    Assessment/Plan:  Acute metabolic encephalopathy.  Could be secondary to the cefepime that was giving [place] outpatient.  Mental status a little bit better today.  Hoping that she can be placed on a diet today.  Continuing lower dose daptomycin and meropenem at this time.  Urine culture still pending.  Continue IV fluid hydration for acute kidney injury.  MRI of the brain negative for acute stroke. Left heel osteomyelitis with previous Pseudomonas growing out of culture.  Acute cystitis with hematuria.  Patient on meropenem and lower dose daptomycin.  Wound VAC left heel. Type 2 diabetes mellitus with hyperlipidemia.  Continue to hold Lantus and Trulicity.  Since yesterday's fingersticks were in the 45s I did add D5 to IV fluids yesterday Acute kidney injury on chronic kidney disease stage IIIa/borderline stage II.  Creatinine 2.92 on presentation down to 1.7 today.  Continue IV fluids. Chronic systolic congestive heart failure.  Currently no signs  of heart failure.  Last EF 40 to 45%.  Continue to hold Lasix and Cozaar due to acute kidney injury. Depression.  Continue Zoloft once able to swallow Hypertension, tachycardia start metoprolol once able to swallow Hypokalemia and hypomagnesemia.  Replace magnesium IV and recheck tomorrow.  Add potassium to the IV fluids.    Code Status:     Code Status Orders  (From admission, onward)           Start     Ordered   12/18/20 1305  Full code  Continuous        12/18/20 1307           Code Status History     Date Active Date Inactive Code Status Order ID Comments User Context   11/17/2020 2034 12/01/2020 2138 Full Code [id number]  Criss Alvine, DO  ED   08/19/2020 1711 08/27/2020 2305 Full Code [id number]  Hessie Knows, MD Inpatient   11/15/2019 2354 11/24/2019 2255 Full Code [id number]  Elwyn Reach, MD ED   12/30/2016 1848 01/05/2017 2200 Full Code [id number]  Newman Pies, MD Inpatient      Advance Directive Documentation    Flowsheet Row Most Recent Value  Type of Advance Directive Healthcare Power of Attorney  Pre-existing out of facility DNR order (yellow form or pink MOST form) --  "MOST" Form in Place? --      Family Communication: Spoke with family at the bedside Disposition Plan: Status is: Inpatient  Consultants: Infectious disease  Antibiotics: Daptomycin and meropenem  Time spent: 28 minutes, case discussed with speech therapy  [place]

## 2020-12-20 NOTE — Progress Notes (Signed)
Speech Language Pathology Treatment: Dysphagia  Patient Details Name: Andrea [name] MRN: [id number] DOB: [birth year] Today's Date: 12/20/2020 Time: 7711-6579 SLP Time Calculation (min) (ACUTE ONLY): 45 min  Assessment / Plan / Recommendation Clinical Impression  Pt was much more awake today but not able to fully participate because of altered mental status. No s/s of aspiration noted with thin liquid BY TSP and one very small sip from the cup. Attempted several times to get Pt to take cup sips and straw sips without success. Even with hand over hand assist and very slow movements Pt often became very anxious and crying "I cant" Daughter assisted with encouraging words. Same response with attempts for applesauce and graham cracker. Daughter reports this anxiety has started over the last week. Pt often began crying and closing her eyes or yelling I cant. She was easily distracted and asked several times about her other daughter.She did say that she would be willing to try cream of chicken soup. Will order a Dys 3 (mechanical soft diet) to allow for choices. Dysphagia or lack of taking PO's is related to altered mental status. Will follow up with toleration of diet and alter as needed. Daughter educated and very supportive of her moms needs.    HPI HPI: Pt is a 75 y.o. female admitted from [facility] with confusion of one days duration and found to have ARF. She has been on cefepime and daptomycin for a heel osteomyelitis and has a wound vac in place. Daughter says she was doing well until last 1 day when she started to not recognize staff at the facility and was not eating or drinking.   She was admitted to [facility] between 11/17/2020 until 12/01/2020 for the L heel osteo and infection which occurred after she had left hip replacement on 08/19/2020.  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  CXR: Mild cardiomegaly, unchanged. Central pulmonary vascular congestion without overt pulmonary edema.     Minimal atelectasis within the left lung base.     Aortic Atherosclerosis.  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 No acute intracranial hemorrhage.      SLP Plan  Continue with current plan of care      Recommendations for follow up therapy are one component of a multi-disciplinary discharge planning process, led by the attending physician.  Recommendations may be updated based on patient status, additional functional criteria and insurance authorization.    Recommendations  Diet recommendations: Dysphagia 3 (mechanical soft);Thin liquid Liquids provided via: Teaspoon;Cup Medication Administration: Via alternative means Supervision: Full supervision/cueing for compensatory strategies Compensations: Minimize environmental distractions;Slow rate;Small sips/bites Postural Changes and/or Swallow Maneuvers: Seated upright 90 degrees;Upright 30-60 min after meal                Oral Care Recommendations: Oral care QID;Oral care prior to ice chip/H20 Follow up Recommendations: Skilled Nursing facility SLP Visit Diagnosis: Dysphagia, oropharyngeal phase (R13.12) Plan: Continue with current plan of care       GO                Andrea [name]  12/20/2020, 12:12 PM

## 2020-12-21 DIAGNOSIS — M86172 Other acute osteomyelitis, left ankle and foot: Secondary | ICD-10-CM | POA: Diagnosis not present

## 2020-12-21 DIAGNOSIS — E876 Hypokalemia: Secondary | ICD-10-CM

## 2020-12-21 DIAGNOSIS — G9341 Metabolic encephalopathy: Secondary | ICD-10-CM | POA: Diagnosis not present

## 2020-12-21 DIAGNOSIS — N179 Acute kidney failure, unspecified: Secondary | ICD-10-CM | POA: Diagnosis not present

## 2020-12-21 DIAGNOSIS — E1169 Type 2 diabetes mellitus with other specified complication: Secondary | ICD-10-CM | POA: Diagnosis not present

## 2020-12-21 LAB — GLUCOSE, CAPILLARY
Glucose-Capillary: 220 mg/dL — ABNORMAL HIGH (ref 70–99)
Glucose-Capillary: 174 mg/dL — ABNORMAL HIGH (ref 70–99)
Glucose-Capillary: 175 mg/dL — ABNORMAL HIGH (ref 70–99)
Glucose-Capillary: 207 mg/dL — ABNORMAL HIGH (ref 70–99)

## 2020-12-21 LAB — BASIC METABOLIC PANEL
Anion gap: 7 (ref 5–15)
BUN: 32 mg/dL — ABNORMAL HIGH (ref 8–23)
CO2: 23 mmol/L (ref 22–32)
Calcium: 8.2 mg/dL — ABNORMAL LOW (ref 8.9–10.3)
Chloride: 115 mmol/L — ABNORMAL HIGH (ref 98–111)
Creatinine, Ser: 1.47 mg/dL — ABNORMAL HIGH (ref 0.44–1.00)
GFR, Estimated: 37 mL/min — ABNORMAL LOW (ref >60)
Glucose, Bld: 236 mg/dL — ABNORMAL HIGH (ref 70–99)
Potassium: 3.3 mmol/L — ABNORMAL LOW (ref 3.5–5.1)
Sodium: 145 mmol/L (ref 135–145)

## 2020-12-21 LAB — MAGNESIUM: Magnesium: 1.8 mg/dL (ref 1.7–2.4)

## 2020-12-21 MED ORDER — TRAZODONE HCL 50 MG PO TABS
50.0000 mg | ORAL_TABLET | Freq: Every evening | ORAL | Status: DC | PRN
  Administered 2020-12-21: 50 mg via ORAL
  Filled 2020-12-21 (×2): qty 1

## 2020-12-21 MED ORDER — OXYCODONE HCL 5 MG PO TABS
5.0000 mg | ORAL_TABLET | ORAL | Status: DC | PRN
  Administered 2020-12-21 – 2020-12-22 (×5): 5 mg via ORAL
  Filled 2020-12-21 (×5): qty 1

## 2020-12-21 MED ORDER — POTASSIUM CHLORIDE 10 MEQ/100ML IV SOLN
10.0000 meq | INTRAVENOUS | Status: AC
Start: 2020-12-21 — End: 2020-12-21
  Administered 2020-12-21 (×2): 10 meq via INTRAVENOUS
  Filled 2020-12-21: qty 100

## 2020-12-21 MED ORDER — INSULIN GLARGINE-YFGN 100 UNIT/ML ~~LOC~~ SOLN
8.0000 [IU] | Freq: Every day | SUBCUTANEOUS | Status: DC
  Administered 2020-12-21: 22:00:00 8 [IU] via SUBCUTANEOUS
  Filled 2020-12-21 (×2): qty 0.08

## 2020-12-21 MED ORDER — OXYCODONE-ACETAMINOPHEN 5-325 MG PO TABS: 1.0000 | ORAL_TABLET | ORAL | Status: DC | PRN

## 2020-12-21 NOTE — Progress Notes (Signed)
Patient ID: Andrea [name], female   DOB: [birth year], 75 y.o.   MRN: [id number] [facility] PROGRESS NOTE  Andrea [name] TIR:[id number] DOB: [birth year] DOA: 12/18/2020 PCP: [name], FNP  HPI/Subjective: Patient recalled my name.  Lots of complaints with respect on how she is taking her medications.  She is very particular on how she takes them.  Mental status much improved as per family.  Objective: Vitals:   12/21/20 0737 12/21/20 0920  BP: (!) 164/66 (!) 161/76  Pulse: 95 83  Resp: 16   Temp: 97.9 F (36.6 C)   SpO2: 99%     Intake/Output Summary (Last 24 hours) at 12/21/2020 1047 Last data filed at 12/21/2020 0431 Gross per 24 hour  Intake 991 ml  Output 750 ml  Net 241 ml   Filed Weights   12/19/20 0150  Weight: 62.5 kg    ROS: Review of Systems  Musculoskeletal:  Positive for joint pain.  Exam: Physical Exam HENT:     Head: Normocephalic.     Mouth/Throat:     Pharynx: No oropharyngeal exudate.  Eyes:     General: Lids are normal.     Conjunctiva/sclera: Conjunctivae normal.  Cardiovascular:     Rate and Rhythm: Normal rate and regular rhythm.     Heart sounds: Normal heart sounds, S1 normal and S2 normal.  Pulmonary:     Breath sounds: No decreased breath sounds, wheezing, rhonchi or rales.  Abdominal:     Palpations: Abdomen is soft.     Tenderness: There is no abdominal tenderness.  Musculoskeletal:     Right lower leg: No swelling.     Left lower leg: No swelling.  Skin:    General: Skin is warm.     Comments: Wound VAC covering left heel.  Neurological:     Mental Status: She is alert.     Comments: Inquiring about her medications.  Mental status much improved.      Scheduled Meds:  heparin  5,000 Units Subcutaneous Q8H   insulin glargine-yfgn  8 Units Subcutaneous QHS   metoprolol tartrate  12.5 mg Oral BID   multivitamin-lutein  1 capsule Oral BID   sertraline  25 mg Oral Daily   Continuous Infusions:  DAPTOmycin  (CUBICIN)  IV Stopped (12/19/20 2330)   meropenem (MERREM) IV Stopped (12/20/20 2300)   potassium chloride 10 mEq (12/21/20 1027)    Assessment/Plan:  Acute metabolic encephalopathy.  This has improved.  Could be secondary to the cefepime that was prescribed as outpatient.  Patient also had acute kidney injury and given IV fluids.  MRI of the brain negative for stroke.  Continue daptomycin and meropenem at this time. 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  Urine culture growing yeast which I usually do not treat.  Continue meropenem and low-dose daptomycin as per ID.  We will check with ID about antibiotics upon getting out of the hospital.  Wound VAC left heel. Type 2 diabetes mellitus with hyperlipidemia.  Restart low-dose Lantus at night.  Continue to hold Trulicity.  Since patient put on a diet I will stop IV fluids. Acute kidney injury on chronic kidney disease stage II.  Creatinine 2.92 on presentation and down to 1.47.  Currently has chronic kidney disease stage IIIa. Chronic systolic congestive heart failure.  No signs of heart failure currently.  Last EF 40 to 45%.  Holding Lasix and Cozaar due to acute kidney injury. Depression on Zoloft Hypertension tachycardia restarted  metoprolol yesterday Hypokalemia and hypomagnesemia.  Replaced potassium IV and magnesium orally.        Code Status:     Code Status Orders  (From admission, onward)           Start     Ordered   12/18/20 1305  Full code  Continuous        12/18/20 1307           Code Status History     Date Active Date Inactive Code Status Order ID Comments User Context   11/17/2020 2034 12/01/2020 2138 Full Code [id number]  Criss Alvine, DO ED   08/19/2020 1711 08/27/2020 2305 Full Code [id number]  Hessie Knows, MD Inpatient   11/15/2019 2354 11/24/2019 2255 Full Code [id number]  Elwyn Reach, MD ED   12/30/2016 1848 01/05/2017 2200 Full Code [id number]  Newman Pies, MD  Inpatient      Advance Directive Documentation    Flowsheet Row Most Recent Value  Type of Advance Directive Healthcare Power of Attorney  Pre-existing out of facility DNR order (yellow form or pink MOST form) --  "MOST" Form in Place? --      Family Communication: Spoke with 1 daughter at the bedside and 1 daughter on the phone Disposition Plan: Status is: Inpatient  Consultants: Infectious disease  Antibiotics: Daptomycin and Cubicin  Time spent: 27 minutes  Real

## 2020-12-21 NOTE — Progress Notes (Signed)
PT Cancellation Note  Patient Details Name: Andrea [name] MRN: [id number] DOB: [birth year]   Cancelled Treatment:    Reason Eval/Treat Not Completed: Fatigue/lethargy limiting ability to participate  Pt and family endorsing fatigue this morning and would prefer to try again later after she has some rest. Will re-attempt later today as time permits.  [name], SPT   [name] 12/21/2020, 7:57 AM

## 2020-12-21 NOTE — Evaluation (Addendum)
Physical Therapy Evaluation Patient Details Name: Andrea [name] MRN: [id number] DOB: [birth year] Today's Date: 12/21/2020  History of Present Illness  Andrea [name] is a 75 y.o. 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   Clinical Impression  Pt is a pleasant 75 year old female who presents to PT evaluation after admission for change in mental status while at SNF for L heel wound care and antibiotic treatment. Pt only alert to herself, demonstrating some anxiety with movement, and requiring frequent redirection due to short attention span/easily distracted. Pt accompanied by daughter during evaluation who provided some prior level of function. At SNF, pt was performing stand pivot transfers with assistance to maintain NWB precautions of L heel. Upon evaluation, pt requiring minA for bed mobility, and modA for stand pivot transfers. Pt demonstrating difficulty with maintaining WB precautions at this time due to decreased cognitive status however, no weight placed through the heel during transfer. Recommending return to SNF for further management of L heel wound and maintain current strength. Will continue to work with patient to improve mobility and strength.      Recommendations for follow up therapy are one component of a multi-disciplinary discharge planning process, led by the attending physician.  Recommendations may be updated based on patient status, additional functional criteria and insurance authorization.  Follow Up Recommendations SNF    Equipment Recommendations  Other (comment) (TBD next venue of care)     Recommendations for Other Services       Precautions / Restrictions Precautions Precautions: Fall Restrictions Weight Bearing Restrictions: Yes LLE Weight Bearing: Non weight bearing      Mobility  Bed Mobility Overal bed mobility: Needs Assistance Bed Mobility: Supine to Sit     Supine to sit: Min assist     General bed mobility comments: MinA for B LEs off EOB and additional time + repetitive verbal cues required due to short attension span    Transfers Overall transfer level: Needs assistance Equipment used: 1 person hand held assist Transfers: Stand Pivot Transfers   Stand pivot transfers: Mod assist       General transfer comment: MinA to stand and modA for pivot towards chair. Difficulty maintaining WB precautions. No pressure through the L heel noted  Ambulation/Gait             General Gait Details: Deferred due to safety and difficulty maintaining WB precautions  Stairs            Wheelchair Mobility    Modified Rankin (Stroke Patients Only)       Balance Overall balance assessment: Needs assistance Sitting-balance support: No upper extremity supported;Feet supported Sitting balance-Leahy Scale: Good     Standing balance support: Bilateral upper extremity supported;During functional activity Standing balance-Leahy Scale: Fair Standing balance comment: Unable to fully extend R leg in stance, requiring B UE assistance                             Pertinent Vitals/Pain Pain Assessment: No/denies pain    Home Living Family/patient expects to be discharged to:: Skilled nursing facility   Available Help at Discharge: Family;Available PRN/intermittently  Additional Comments: Pt reports coming from [facility] for wound care with wound VAC and antibiotic treatments.    Prior Function           Comments: Currently with WB precautions, pt is NWB on L LE and performing stand pivot transfers at SNF      Hand Dominance        Extremity/Trunk Assessment        Lower Extremity Assessment Lower Extremity Assessment: Generalized weakness;LLE deficits/detail LLE Deficits / Details: NWB currently due to L heel wound. Wound VAC in place       Communication   Communication: No difficulties  Cognition Arousal/Alertness: Awake/alert   Overall Cognitive Status: Impaired/Different from baseline Area of Impairment: Orientation;Attention                               General Comments: Demonstrating short attention span and anxious regarding the transfer and lines and leads      General Comments      Exercises     Assessment/Plan    PT Assessment Patient needs continued PT services  PT Problem List Decreased strength;Decreased coordination;Pain;Decreased range of motion;Decreased activity tolerance;Decreased knowledge of use of DME;Decreased balance;Decreased mobility;Decreased knowledge of precautions;Decreased skin integrity       PT Treatment Interventions DME instruction;Gait training;Stair training;Balance training;Therapeutic exercise;Therapeutic activities;Functional mobility training;Neuromuscular re-education;Cognitive remediation;Patient/family education;Modalities;Manual techniques    PT Goals (Current goals can be found in the Care Plan section)  Acute Rehab PT Goals Patient Stated Goal: to go home PT Goal Formulation: With patient Time For Goal Achievement: 01/04/21 Potential to Achieve Goals: Good    Frequency Min 2X/week   Barriers to discharge        Co-evaluation               AM-PAC PT "6 Clicks" Mobility  Outcome Measure Help needed turning from your back to your side while in a flat bed without using bedrails?: None Help needed moving from lying on your back to sitting on the side of a flat bed without using bedrails?: A Little Help needed moving to and from a bed to a chair (including a wheelchair)?: A Lot Help needed  standing up from a chair using your arms (e.g., wheelchair or bedside chair)?: A Little Help needed to walk in hospital room?: Total Help needed climbing 3-5 steps with a railing? : Total 6 Click Score: 14    End of Session Equipment Utilized During Treatment: Gait belt Activity Tolerance: Patient tolerated treatment well Patient left: in chair;with call bell/phone within reach;with chair alarm set;with family/visitor present Nurse Communication: Mobility status PT Visit Diagnosis: Unsteadiness on feet (R26.81);Other abnormalities of gait and mobility (R26.89);Muscle weakness (generalized) (M62.81);Difficulty in walking, not elsewhere classified (R26.2)    Time: 0814-4818 PT Time Calculation (min) (ACUTE ONLY): 26 min   Charges:   PT Evaluation $PT Eval Low Complexity: 1 Low          [name], SPT   [name] 12/21/2020, 1:20 PM

## 2020-12-22 DIAGNOSIS — R531 Weakness: Secondary | ICD-10-CM | POA: Diagnosis not present

## 2020-12-22 DIAGNOSIS — G9341 Metabolic encephalopathy: Secondary | ICD-10-CM | POA: Diagnosis not present

## 2020-12-22 DIAGNOSIS — E119 Type 2 diabetes mellitus without complications: Secondary | ICD-10-CM | POA: Diagnosis not present

## 2020-12-22 DIAGNOSIS — M7918 Myalgia, other site: Secondary | ICD-10-CM | POA: Diagnosis not present

## 2020-12-22 DIAGNOSIS — Z743 Need for continuous supervision: Secondary | ICD-10-CM | POA: Diagnosis not present

## 2020-12-22 DIAGNOSIS — I5022 Chronic systolic (congestive) heart failure: Secondary | ICD-10-CM | POA: Diagnosis not present

## 2020-12-22 DIAGNOSIS — A419 Sepsis, unspecified organism: Secondary | ICD-10-CM | POA: Diagnosis not present

## 2020-12-22 DIAGNOSIS — Z736 Limitation of activities due to disability: Secondary | ICD-10-CM | POA: Diagnosis not present

## 2020-12-22 DIAGNOSIS — E1122 Type 2 diabetes mellitus with diabetic chronic kidney disease: Secondary | ICD-10-CM | POA: Diagnosis not present

## 2020-12-22 DIAGNOSIS — N189 Chronic kidney disease, unspecified: Secondary | ICD-10-CM | POA: Diagnosis not present

## 2020-12-22 DIAGNOSIS — L89620 Pressure ulcer of left heel, unstageable: Secondary | ICD-10-CM | POA: Diagnosis not present

## 2020-12-22 DIAGNOSIS — Z7401 Bed confinement status: Secondary | ICD-10-CM | POA: Diagnosis not present

## 2020-12-22 DIAGNOSIS — N3281 Overactive bladder: Secondary | ICD-10-CM | POA: Diagnosis not present

## 2020-12-22 DIAGNOSIS — I2510 Atherosclerotic heart disease of native coronary artery without angina pectoris: Secondary | ICD-10-CM | POA: Diagnosis not present

## 2020-12-22 DIAGNOSIS — I130 Hypertensive heart and chronic kidney disease with heart failure and stage 1 through stage 4 chronic kidney disease, or unspecified chronic kidney disease: Secondary | ICD-10-CM | POA: Diagnosis not present

## 2020-12-22 DIAGNOSIS — Z794 Long term (current) use of insulin: Secondary | ICD-10-CM | POA: Diagnosis not present

## 2020-12-22 DIAGNOSIS — K219 Gastro-esophageal reflux disease without esophagitis: Secondary | ICD-10-CM | POA: Diagnosis not present

## 2020-12-22 DIAGNOSIS — N179 Acute kidney failure, unspecified: Secondary | ICD-10-CM | POA: Diagnosis not present

## 2020-12-22 DIAGNOSIS — E559 Vitamin D deficiency, unspecified: Secondary | ICD-10-CM | POA: Diagnosis not present

## 2020-12-22 DIAGNOSIS — E785 Hyperlipidemia, unspecified: Secondary | ICD-10-CM | POA: Diagnosis not present

## 2020-12-22 DIAGNOSIS — L03116 Cellulitis of left lower limb: Secondary | ICD-10-CM | POA: Diagnosis not present

## 2020-12-22 DIAGNOSIS — M86172 Other acute osteomyelitis, left ankle and foot: Secondary | ICD-10-CM | POA: Diagnosis not present

## 2020-12-22 DIAGNOSIS — I5030 Unspecified diastolic (congestive) heart failure: Secondary | ICD-10-CM | POA: Diagnosis not present

## 2020-12-22 DIAGNOSIS — G8929 Other chronic pain: Secondary | ICD-10-CM | POA: Diagnosis not present

## 2020-12-22 DIAGNOSIS — E876 Hypokalemia: Secondary | ICD-10-CM | POA: Diagnosis not present

## 2020-12-22 DIAGNOSIS — E1169 Type 2 diabetes mellitus with other specified complication: Secondary | ICD-10-CM | POA: Diagnosis not present

## 2020-12-22 DIAGNOSIS — E113293 Type 2 diabetes mellitus with mild nonproliferative diabetic retinopathy without macular edema, bilateral: Secondary | ICD-10-CM | POA: Diagnosis not present

## 2020-12-22 DIAGNOSIS — M86179 Other acute osteomyelitis, unspecified ankle and foot: Secondary | ICD-10-CM | POA: Diagnosis not present

## 2020-12-22 DIAGNOSIS — R000 Tachycardia, unspecified: Secondary | ICD-10-CM | POA: Diagnosis not present

## 2020-12-22 DIAGNOSIS — E569 Vitamin deficiency, unspecified: Secondary | ICD-10-CM | POA: Diagnosis not present

## 2020-12-22 DIAGNOSIS — M6281 Muscle weakness (generalized): Secondary | ICD-10-CM | POA: Diagnosis not present

## 2020-12-22 DIAGNOSIS — K2100 Gastro-esophageal reflux disease with esophagitis, without bleeding: Secondary | ICD-10-CM | POA: Diagnosis not present

## 2020-12-22 DIAGNOSIS — F32A Depression, unspecified: Secondary | ICD-10-CM | POA: Diagnosis not present

## 2020-12-22 DIAGNOSIS — R5381 Other malaise: Secondary | ICD-10-CM | POA: Diagnosis not present

## 2020-12-22 DIAGNOSIS — I10 Essential (primary) hypertension: Secondary | ICD-10-CM | POA: Diagnosis not present

## 2020-12-22 LAB — BASIC METABOLIC PANEL
Anion gap: 5 (ref 5–15)
BUN: 26 mg/dL — ABNORMAL HIGH (ref 8–23)
CO2: 26 mmol/L (ref 22–32)
Calcium: 8.5 mg/dL — ABNORMAL LOW (ref 8.9–10.3)
Chloride: 111 mmol/L (ref 98–111)
Creatinine, Ser: 1.22 mg/dL — ABNORMAL HIGH (ref 0.44–1.00)
GFR, Estimated: 46 mL/min — ABNORMAL LOW (ref >60)
Glucose, Bld: 126 mg/dL — ABNORMAL HIGH (ref 70–99)
Potassium: 3.7 mmol/L (ref 3.5–5.1)
Sodium: 142 mmol/L (ref 135–145)

## 2020-12-22 LAB — URINALYSIS, COMPLETE (UACMP) WITH MICROSCOPIC
Bacteria, UA: NONE SEEN
Bilirubin Urine: NEGATIVE
Glucose, UA: >=500 mg/dL — AB
Ketones, ur: NEGATIVE mg/dL
Nitrite: NEGATIVE
Protein, ur: 100 mg/dL — AB
Specific Gravity, Urine: 1.011 (ref 1.005–1.030)
WBC, UA: >50 WBC/hpf — ABNORMAL HIGH (ref 0–5)
pH: 6 (ref 5.0–8.0)

## 2020-12-22 LAB — GLUCOSE, CAPILLARY
Glucose-Capillary: 122 mg/dL — ABNORMAL HIGH (ref 70–99)
Glucose-Capillary: 172 mg/dL — ABNORMAL HIGH (ref 70–99)
Glucose-Capillary: 226 mg/dL — ABNORMAL HIGH (ref 70–99)

## 2020-12-22 LAB — RESP PANEL BY RT-PCR (FLU A&B, COVID) ARPGX2
Influenza A by PCR: NEGATIVE
Influenza B by PCR: NEGATIVE
SARS Coronavirus 2 by RT PCR: NEGATIVE

## 2020-12-22 LAB — CK: Total CK: 40 U/L (ref 38–234)

## 2020-12-22 MED ORDER — ACETAMINOPHEN 325 MG PO TABS: 650.0000 mg | ORAL_TABLET | Freq: Four times a day (QID) | ORAL | Status: DC | PRN

## 2020-12-22 MED ORDER — LANTUS SOLOSTAR 100 UNIT/ML ~~LOC~~ SOPN: 14.0000 [IU] | PEN_INJECTOR | Freq: Every day | SUBCUTANEOUS | 3 refills | Status: DC

## 2020-12-22 MED ORDER — CEFTAZIDIME IV (FOR PTA / DISCHARGE USE ONLY): 2.0000 g | Freq: Two times a day (BID) | INTRAVENOUS | 0 refills | Status: AC

## 2020-12-22 MED ORDER — SODIUM CHLORIDE 0.9 % IV SOLN
2.0000 g | Freq: Two times a day (BID) | INTRAVENOUS | Status: DC
  Administered 2020-12-22: 2 g via INTRAVENOUS
  Filled 2020-12-22 (×3): qty 2

## 2020-12-22 MED ORDER — SODIUM CHLORIDE 0.9 % IV SOLN
400.0000 mg | Freq: Every day | INTRAVENOUS | Status: DC
  Filled 2020-12-22: qty 8

## 2020-12-22 MED ORDER — OXYCODONE HCL 5 MG PO TABS: 5.0000 mg | ORAL_TABLET | Freq: Four times a day (QID) | ORAL | 0 refills | Status: DC | PRN

## 2020-12-22 NOTE — Progress Notes (Signed)
[facility] INFECTIOUS DISEASE PROGRESS NOTE Date of Admission:  12/18/2020     ID: Andrea [name] is a 75 y.o. female with  AMS, heel osteo, UTI Principal Problem:   Acute metabolic encephalopathy Active Problems:   Type 2 diabetes mellitus with hyperlipidemia (HCC)   Acute kidney injury superimposed on CKD ([place])   Delirium   Chronic systolic CHF (congestive heart failure) (HCC)   Osteomyelitis of foot, left, acute (HCC)   Depression   Essential hypertension   Tachycardia   Hypokalemia   Doing much bette.r sittin up on side of bed. Interactive   ROS unable to obtain  Medications:  Antibiotics Given (last 72 hours)     Date/Time Action Medication Dose Rate   12/19/20 1217 New Bag/Given   meropenem (MERREM) 1 g in sodium chloride 0.9 % 100 mL IVPB 1 g 200 mL/hr   12/19/20 2243 New Bag/Given   DAPTOmycin (CUBICIN) 400 mg in sodium chloride 0.9 % IVPB 400 mg 116 mL/hr   12/19/20 2328 New Bag/Given   meropenem (MERREM) 1 g in sodium chloride 0.9 % 100 mL IVPB 1 g 200 mL/hr   12/20/20 1660 New Bag/Given   meropenem (MERREM) 1 g in sodium chloride 0.9 % 100 mL IVPB 1 g 200 mL/hr   12/20/20 2222 New Bag/Given   meropenem (MERREM) 1 g in sodium chloride 0.9 % 100 mL IVPB 1 g 200 mL/hr   12/21/20 1136 New Bag/Given   meropenem (MERREM) 1 g in sodium chloride 0.9 % 100 mL IVPB 1 g 200 mL/hr   12/21/20 2223 New Bag/Given   meropenem (MERREM) 1 g in sodium chloride 0.9 % 100 mL IVPB 1 g 200 mL/hr   12/21/20 2332 New Bag/Given   DAPTOmycin (CUBICIN) 400 mg in sodium chloride 0.9 % IVPB 400 mg 116 mL/hr       heparin  5,000 Units Subcutaneous Q8H   insulin glargine-yfgn  8 Units Subcutaneous QHS   metoprolol tartrate  12.5 mg Oral BID   multivitamin-lutein  1 capsule Oral BID   sertraline  25 mg Oral Daily    Objective: Vital signs in last 24 hours: Temp:  [97.7 F (36.5 C)-98.4 F (36.9 C)] 98.4 F (36.9 C) (10/17 0711) Pulse Rate:  [76-96] 96 (10/17 0711) Resp:   [16-18] 16 (10/17 0711) BP: (141-161)/(59-76) 147/62 (10/17 0711) SpO2:  [97 %-100 %] 97 % (10/17 0711) Constitutional:  calert interactive  HENT: [place]/AT, PERRLA, no scleral icterus Mouth/Throat: Oropharynx is clear and moist. Cardiovascular: Normal rate, regular rhythm and normal heart sounds. Exam reveals no gallop and no friction rub.  No murmur heard.  Pulmonary/Chest: Effort normal and breath sounds normal. No respiratory distress.  has no wheezes.  Neck = supple, no nuchal rigidity Abdominal: Soft. Bowel sounds are normal.  exhibits no distension. There is no tenderness.  Lymphadenopathy: no cervical adenopathy. No axillary adenopathy Neurological: alert Skin: Skin is warm and dry. L heel with wound vac in place Psychiatric: alert  Lab Results Recent Labs    12/20/20 0449 12/21/20 0436 12/22/20 0510  WBC 7.6  --   --   HGB 10.5*  --   --   HCT 32.7*  --   --   NA 144 145 142  K 3.0* 3.3* 3.7  CL 115* 115* 111  CO2 23 23 26   BUN 39* 32* 26*  CREATININE 1.70* 1.47* 1.22*    Microbiology: Results for orders placed or performed during the hospital encounter of 12/18/20  Culture,  blood (routine x 2)     Status: None (Preliminary result)   Collection Time: 12/18/20 10:22 AM   Specimen: BLOOD  Result Value Ref Range Status   Specimen Description BLOOD LEFT FA  Final   Special Requests   Final    BOTTLES DRAWN AEROBIC AND ANAEROBIC Blood Culture adequate volume   Culture   Final    NO GROWTH 4 DAYS Performed at [facility], [address], [place], [place] [address]    Report Status PENDING  Incomplete  Resp Panel by RT-PCR (Flu A&B, Covid) Nasopharyngeal Swab     Status: None   Collection Time: 12/18/20 10:24 AM   Specimen: Nasopharyngeal Swab; Nasopharyngeal(NP) swabs in vial transport medium  Result Value Ref Range Status   SARS Coronavirus 2 by RT PCR NEGATIVE NEGATIVE Final    Comment: (NOTE) SARS-CoV-2 target nucleic acids are NOT DETECTED.  The  SARS-CoV-2 RNA is generally detectable in upper respiratory specimens during the acute phase of infection. The lowest concentration of SARS-CoV-2 viral copies this assay can detect is 138 copies/mL. A negative result does not preclude SARS-Cov-2 infection and should not be used as the sole basis for treatment or other patient management decisions. A negative result may occur with  improper specimen collection/handling, submission of specimen other than nasopharyngeal swab, presence of viral mutation(s) within the areas targeted by this assay, and inadequate number of viral copies(<138 copies/mL). A negative result must be combined with clinical observations, patient history, and epidemiological information. The expected result is Negative.  Fact Sheet for Patients:  EntrepreneurPulse.com.au  Fact Sheet for Healthcare Providers:  IncredibleEmployment.be  This test is no t yet approved or cleared by the [place] FDA and  has been authorized for detection and/or diagnosis of SARS-CoV-2 by FDA under an Emergency Use Authorization (EUA). This EUA will remain  in effect (meaning this test can be used) for the duration of the COVID-19 declaration under Section 564(b)(1) of the Act, 21 U.S.C.section 360bbb-3(b)(1), unless the authorization is terminated  or revoked sooner.       Influenza A by PCR NEGATIVE NEGATIVE Final   Influenza B by PCR NEGATIVE NEGATIVE Final    Comment: (NOTE) The Xpert Xpress SARS-CoV-2/FLU/RSV plus assay is intended as an aid in the diagnosis of influenza from Nasopharyngeal swab specimens and should not be used as a sole basis for treatment. Nasal washings and aspirates are unacceptable for Xpert Xpress SARS-CoV-2/FLU/RSV testing.  Fact Sheet for Patients: EntrepreneurPulse.com.au  Fact Sheet for Healthcare Providers: IncredibleEmployment.be  This test is not yet approved or  cleared by the [place] FDA and has been authorized for detection and/or diagnosis of SARS-CoV-2 by FDA under an Emergency Use Authorization (EUA). This EUA will remain in effect (meaning this test can be used) for the duration of the COVID-19 declaration under Section 564(b)(1) of the Act, 21 U.S.C. section 360bbb-3(b)(1), unless the authorization is terminated or revoked.  Performed at [facility] [address] [place], [place] [address]   Culture, blood (routine x 2)     Status: None (Preliminary result)   Collection Time: 12/18/20 11:06 AM   Specimen: BLOOD  Result Value Ref Range Status   Specimen Description BLOOD LEFT WRIST  Final   Special Requests   Final    BOTTLES DRAWN AEROBIC AND ANAEROBIC Blood Culture adequate volume   Culture   Final    NO GROWTH 4 DAYS Performed at [facility], [address] [place], [place] [address]  Report Status PENDING  Incomplete  Urine Culture     Status: Abnormal   Collection Time: 12/18/20  1:55 PM   Specimen: Urine, Catheterized  Result Value Ref Range Status   Specimen Description   Final    URINE, CATHETERIZED Performed at [facility], [address] [place], [place] [address]    Special Requests   Final    NONE Performed at [facility], [address] [place], [place] [address]    Culture (A)  Final    70,000 COLONIES/mL YEAST Standardized susceptibility testing for this organism is not available. Performed at [facility], [place] [address] [place], [place] [address]    Report Status 12/20/2020 FINAL  Final    Studies/Results: No results found.  Assessment/Plan: HARNEET NOBLETT is a 75 y.o. female with DM being treated for a L heel ulcer and osteo with cefepime and daptonmycin who was doing well at [facility] but now admitted with AMS x 1 day. ARF, positive UA. She likely has UTI leading to decreased PO intake and ARF. She had  some myoclonus on exam so possibly  cefepime neurotoxicity related to ARF and elevated levels.    Oct 17 - MS much better. MRI neg. UCX Yeast. Cr down to 1.22.    Recommendations Cong dapto - adjust per renal function. DC meropenem and cont  ceftazidime to complete course of IV abx for heel ulcer Continue wound vac Thank you very much for the consult. Will follow with you.  Leonel Ramsay   12/22/2020, 9:06 AM

## 2020-12-22 NOTE — Progress Notes (Addendum)
Pharmacy Antibiotic Note  Andrea [name] is a 75 y.o. female admitted on 12/18/2020 with  calcaneal osteomyelitis  and possible UTI.  Pharmacy has been consulted for meropenem dosing. 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 Bone culture grew P. Aeruginosa. She presents with AKI and AMS 10/13.  Per recent ID clinic note does not appear she was eating well at SNF.    10/17: Per ID change meropenem to ceftazidime (did not resume cefepime d/t it possibly causing encephalopathy)  Today, 12/22/2020 CK = 40 Renal: SCr = 1.22, CrCL = 33 ml/,min   Plan: Start Ceftazidime 2gm IV q12h based on improved renal function Adjust daptomycin back to 400 mg IV q24h for CrCL now >30 ml/min Follow renal function CK weekly  Temp (24hrs), Avg:98 F (36.7 C), Min:97.7 F (36.5 C), Max:98.4 F (36.9 C)  Recent Labs  Lab 12/18/20 1022 12/18/20 1224 12/19/20 0545 12/20/20 0449 12/21/20 0436 12/22/20 0510  WBC 9.5  --  7.0 7.6  --   --   CREATININE 2.92*  --  2.25* 1.70* 1.47* 1.22*  LATICACIDVEN 0.9 1.0  --   --   --   --      Estimated Creatinine Clearance: 32.9 mL/min (A) (by C-G formula based on SCr of 1.22 mg/dL (H)).    Allergies  Allergen Reactions   Chlorhexidine    Metformin And Related Diarrhea   Sulfa Antibiotics Rash    Antimicrobials this admission: PTA dapto/cefepime Meropenem 10/13 >>10/17 Ceftazidime 10/17 >>   Microbiology results: 10/13 BCx: no growth pending 10/13 UCx: yeast   Thank you for allowing pharmacy to be a part of this patient's care.   Doreene Eland, PharmD, BCPS.   Work Cell: [phone] 12/22/2020 9:35 AM

## 2020-12-22 NOTE — Progress Notes (Signed)
Chart reviewed. Visited with Pt and daughter. Pt was up eating lunch without difficulty. Mental status much improved. Tolerated breakfast well per daughter. No further ST needed at this time.

## 2020-12-22 NOTE — TOC Progression Note (Addendum)
Transition of Care [facility]) - Progression Note    Patient Details  Name: Andrea [name] MRN: [id number] Date of Birth: 05/05/1945  Transition of Care [facility]) CM/SW Contact  Shelbie Hutching, RN Phone Number: 12/22/2020, 3:04 PM  Clinical Narrative:    Daughter Joy agrees with EMS transport- [name] wanted to transport the patient herself due to worry over EMS copay.  Bedside RN and this RNCM do not believe that patient would be safe to go by car as she is non weight bearing to the left foot and is a big fall risk.  Joy says go ahead with EMS and she will talk with [name] later.   EMS has been called, bedside RN requests pickup after 4pm.     Expected Discharge Plan: Skilled Nursing Facility Barriers to Discharge: Barriers Resolved  Expected Discharge Plan and Services Expected Discharge Plan: [place]   Discharge Planning Services: CM Consult Post Acute Care Choice: [place] Living arrangements for the past 2 months: [place], [place] Expected Discharge Date: 12/22/20               DME Arranged: N/A DME Agency: NA       HH Arranged: NA HH Agency: NA         Social Determinants of Health (SDOH) Interventions    Readmission Risk Interventions Readmission Risk Prevention Plan 12/19/2020 08/21/2020  Transportation Screening Complete Complete  PCP or Specialist Appt within 5-7 Days - Complete  PCP or Specialist Appt within 3-5 Days Complete -  Home Care Screening - Complete  Medication Review (RN CM) - Complete  HRI or Home Care Consult Complete -  Social Work Consult for [place] Planning/Counseling Complete -  Palliative Care Screening Not Applicable -  Medication Review Press photographer) Complete -  Some recent data might be hidden

## 2020-12-22 NOTE — Discharge Summary (Signed)
[facility] [place] NAME: Andrea [name]    MR#:  [id number]  DATE OF BIRTH:  12-18-1945  DATE OF ADMISSION:  12/18/2020 ADMITTING PHYSICIAN: Andrea Bullock, MD  DATE OF DISCHARGE: 12/22/2020  PRIMARY CARE PHYSICIAN: Andrea Sprout, FNP    ADMISSION DIAGNOSIS:  AKI (acute kidney injury) ([place]) [N17.9] Altered mental status, unspecified altered mental status type [K74.25] Acute metabolic encephalopathy [Z56.38]  DISCHARGE DIAGNOSIS:  Principal Problem:   Acute metabolic encephalopathy Active Problems:   Type 2 diabetes mellitus with hyperlipidemia (HCC)   Acute kidney injury superimposed on CKD (HCC)   Delirium   Chronic systolic CHF (congestive heart failure) (HCC)   Osteomyelitis of foot, left, acute (HCC)   Depression   Essential hypertension   Tachycardia   Hypokalemia   SECONDARY DIAGNOSIS:   Past Medical History:  Diagnosis Date  . Anxiety   . Arthritis   . CAD (coronary artery disease)   . Chickenpox   . Chronic systolic heart failure ([place])   . COPD (chronic obstructive pulmonary disease) (HCC)    mild-no inhalers  . Coronary artery disease   . 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 Depression   . Diabetes mellitus type 2, uncomplicated ([place])   . Diabetic retinopathy ([place])   . Dupuytren contracture 2011   s/p surgery to LEFT hand  . Frequent urinary tract infections   . GERD (gastroesophageal reflux disease)   . Grade I diastolic dysfunction   . HTN (hypertension)   . Hyperlipidemia, unspecified   . Irritable bowel syndrome   . Meningitis 11/2019  . MI (myocardial infarction) ([place]) 1994   no stent  . Neuromuscular disorder ([place])    nerve pain in back and legs  . Osteoporosis   . Pneumonia 2021  . RBBB   . Sepsis ([place]) 11/2019  . Skin cancer   . TIA (transient ischemic attack) 2014  . Vitamin D deficiency, unspecified   . Wheezing     HOSPITAL COURSE:   Acute metabolic encephalopathy.  This has  improved.  Could be secondary to be cefepime that was prescribed or versus acute kidney injury.  Mental status much improved.  MRI of the brain negative for stroke.  Continue daptomycin and ceftazidime until completion on 12/28/2020. 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  Urine culture this hospital stay growing yeast which I do not treat.  Antibiotic switched to ceftazidime.  Continue daptomycin.  Continue wound VAC left heel.  Has follow-up appointment with Dr. Amalia [name] podiatry on the 25th.  Advised to keep appointment.  Change wound VAC 3 times a week. Type 2 diabetes mellitus with hyperlipidemia.  Increase Lantus insulin to 14 units at night.  If sugars continue to be in the 200s can go up 2 to 3 units every few days until up to her usual 20 units at night.  Can restart Trulicity weekly.  Restart atorvastatin. Acute kidney injury on chronic kidney disease stage II.  Creatinine 2.92 on presentation and down to 1.22 today.  Last GFR 46.  Patient has weekly labs with the IV antibiotics.  Holding losartan Lasix and potassium for now but if any signs of shortness of breath can restart Lasix. Chronic systolic congestive heart failure.  No signs of heart failure currently.  Last EF 40 to 45%.  Holding Lasix and Cozaar due to acute kidney injury.  With next set of labs if creatinine continues to improve can restart  Lasix and Cozaar. Depression on Zoloft Hypertension tachycardia continue Toprol-XL Hypokalemia and hypomagnesemia these have been replaced during the hospital course.  Now that she is eating better this should not be an issue.  Can restart potassium once restarting Lasix. Weakness.  Physical therapy can do toe-touch with left foot because nonweightbearing status.  DISCHARGE CONDITIONS:   Satisfactory  CONSULTS OBTAINED:  Infectious disease  DRUG ALLERGIES:   Allergies  Allergen Reactions  . Chlorhexidine   . Metformin And Related Diarrhea  . Sulfa  Antibiotics Rash    DISCHARGE MEDICATIONS:   Allergies as of 12/22/2020       Reactions   Chlorhexidine    Metformin And Related Diarrhea   Sulfa Antibiotics Rash        Medication List     STOP taking these medications    BERBERINE COMPLEX PO   ceFEPime  IVPB Commonly known as: MAXIPIME   furosemide 20 MG tablet Commonly known as: LASIX   gentamicin cream 0.1 % Commonly known as: GARAMYCIN   losartan 25 MG tablet Commonly known as: COZAAR   oxybutynin 10 MG 24 hr tablet Commonly known as: DITROPAN-XL   oxyCODONE-acetaminophen 5-325 MG tablet Commonly known as: Percocet   potassium chloride 10 MEQ tablet Commonly known as: KLOR-CON   Santyl ointment Generic drug: collagenase       TAKE these medications    acetaminophen 325 MG tablet Commonly known as: TYLENOL Take 2 tablets (650 mg total) by mouth every 6 (six) hours as needed for mild pain (or Fever >/= 101).   Alcohol Prep Pads Use twice daily prior to SQ injection of insulin to clean skin   atorvastatin 20 MG tablet Commonly known as: LIPITOR Take 1 tablet (20 mg total) by mouth daily. What changed: when to take this   B-12 1000 MCG Caps Take 1,000 mcg by mouth daily.   Calcium Carbonate-Vitamin D 600-400 MG-UNIT tablet Take 1 tablet by mouth 2 (two) times daily.   cefTAZidime  IVPB Commonly known as: FORTAZ Inject 2 g into the vein every 12 (twelve) hours for 6 days. Indication:  L heel ulcer and osteomyelitis Last Day of Therapy:  12/28/2020 Labs - Once weekly:  CBC/D, CMP, CK, ESR   daptomycin  IVPB Commonly known as: CUBICIN Inject 400 mg into the vein daily. Indication:  Left heel ulcer with osteomyelitis First Dose: Yes Last Day of Therapy:  12/28/2020 Labs - Once weekly:  CBC/D, CMP, CK, ESR Method of administration: IV Push Method of administration may be changed at the discretion of home infusion pharmacist based upon assessment of the patient and/or caregiver's ability to  self-administer the medication ordered.   hydrOXYzine 25 MG tablet Commonly known as: ATARAX/VISTARIL Take 25 mg by mouth daily.   INSULIN SYRINGE 1CC/29G 29G X 1/2" 1 ML Misc For lantus injections twice daily   Lantus SoloStar 100 UNIT/ML Solostar Pen Generic drug: insulin glargine Inject 14 Units into the skin at bedtime. What changed: how much to take   metoprolol succinate 25 MG 24 hr tablet Commonly known as: TOPROL-XL TAKE 1 TABLET BY MOUTH EVERY DAY   Omega-3 1000 MG Caps Take 1,000 mg by mouth daily.   omeprazole 40 MG capsule Commonly known as: PRILOSEC TAKE 1 CAPSULE BY MOUTH EVERY DAY   OneTouch Delica Lancets 41Y Misc To check blood sugar daily  DX: E11.9   OneTouch Verio test strip Generic drug: glucose blood TO CHECK BLOOD SUGAR ONCE DAILY.   oxyCODONE 5 MG immediate  release tablet Commonly known as: Oxy IR/ROXICODONE Take 1 tablet (5 mg total) by mouth every 6 (six) hours as needed for severe pain or moderate pain.   PRESERVISION AREDS 2 PO Take 1 tablet by mouth in the morning and at bedtime.   sertraline 25 MG tablet Commonly known as: ZOLOFT Take 25 mg by mouth daily.   TRUEplus Pen Needles 31G X 6 MM Misc Generic drug: Insulin Pen Needle Use with lantus two times daily.   Trulicity 1.5 TI/1.4ER Sopn Generic drug: Dulaglutide INJECT 1.5MG INTO THE SKIN ONCE A WEEK AS DIRECTED What changed: See the new instructions.   vitamin C 500 MG tablet Commonly known as: ASCORBIC ACID Take 500 mg by mouth 2 (two) times daily.   Vitamin D (Ergocalciferol) 1.25 MG (50000 UNIT) Caps capsule Commonly known as: DRISDOL TAKE ONE CAPSULE BY MOUTH ONCE WEEKLY ON THE SAME DAY EACH WEEK (ON FRIDAYS) What changed: See the new instructions.   zinc gluconate 50 MG tablet Take 50 mg by mouth daily.               Home Infusion Instuctions  (From admission, onward)           Start     Ordered   12/22/20 0000  Home infusion instructions        Question:  Instructions  Answer:  Flushing of vascular access device: 0.9% NaCl pre/post medication administration and prn patency; Heparin 100 u/ml, 57m for implanted ports and Heparin 10u/ml, 51mfor all other central venous catheters.   12/22/20 1158             DISCHARGE INSTRUCTIONS:   Follow-up team at rehab 1 day Follow-up Dr. EvAmalia Haileycheduled on the 25th  If you experience worsening of your admission symptoms, develop shortness of breath, life threatening emergency, suicidal or homicidal thoughts you must seek medical attention immediately by calling 911 or calling your MD immediately  if symptoms less severe.  You Must read complete instructions/literature along with all the possible adverse reactions/side effects for all the Medicines you take and that have been prescribed to you. Take any new Medicines after you have completely understood and accept all the possible adverse reactions/side effects.   Please note  You were cared for by a hospitalist during your hospital stay. If you have any questions about your discharge medications or the care you received while you were in the hospital after you are discharged, you can call the unit and asked to speak with the hospitalist on call if the hospitalist that took care of you is not available. Once you are discharged, your primary care physician will handle any further medical issues. Please note that NO REFILLS for any discharge medications will be authorized once you are discharged, as it is imperative that you return to your primary care physician (or establish a relationship with a primary care physician if you do not have one) for your aftercare needs so that they can reassess your need for medications and monitor your lab values.    Today   CHIEF COMPLAINT:   Chief Complaint  Patient presents with  . Altered Mental Status    HISTORY OF PRESENT ILLNESS:  ReKeanu Frickeyis a [age].o. female came in with altered mental  status   VITAL SIGNS:  Blood pressure 135/64, pulse 83, temperature 98.1 F (36.7 C), temperature source Oral, resp. rate 18, height 5' (1.524 m), weight 62.5 kg, SpO2 100 %.  I/O:   Intake/Output  Summary (Last 24 hours) at 12/22/2020 1410 Last data filed at 12/22/2020 1051 Gross per 24 hour  Intake 1191.61 ml  Output 900 ml  Net 291.61 ml    PHYSICAL EXAMINATION:  GENERAL:  75 y.o.-year-old patient lying in the bed with no acute distress.  EYES: Pupils equal, round, reactive to light and accommodation. No scleral icterus. HEENT: Head atraumatic, normocephalic. Oropharynx and nasopharynx clear.   LUNGS: Normal breath sounds bilaterally, no wheezing, rales,rhonchi or crepitation. No use of accessory muscles of respiration.  CARDIOVASCULAR: S1, S2 normal. No murmurs, rubs, or gallops.  ABDOMEN: Soft, non-tender, non-distended. EXTREMITIES: No pedal edema, cyanosis, or clubbing.  NEUROLOGIC: Cranial nerves II through XII are intact. Muscle strength 5/5 in all extremities. Sensation intact. Gait not checked.  PSYCHIATRIC: The patient is alert and answers all questions appropriately.  SKIN: Wound VAC on left heel  DATA REVIEW:   CBC Recent Labs  Lab 12/20/20 0449  WBC 7.6  HGB 10.5*  HCT 32.7*  PLT 211    Chemistries  Recent Labs  Lab 12/18/20 1022 12/19/20 0545 12/21/20 0436 12/22/20 0510  NA 138   < > 145 142  K 4.5   < > 3.3* 3.7  CL 106   < > 115* 111  CO2 24   < > 23 26  GLUCOSE 120*   < > 236* 126*  BUN 55*   < > 32* 26*  CREATININE 2.92*   < > 1.47* 1.22*  CALCIUM 9.1   < > 8.2* 8.5*  MG  --    < > 1.8  --   AST 17  --   --   --   ALT 16  --   --   --   ALKPHOS 130*  --   --   --   BILITOT 0.8  --   --   --    < > = values in this interval not displayed.    Microbiology Results  Results for orders placed or performed during the hospital encounter of 12/18/20  Culture, blood (routine x 2)     Status: None (Preliminary result)   Collection Time:  12/18/20 10:22 AM   Specimen: BLOOD  Result Value Ref Range Status   Specimen Description BLOOD LEFT FA  Final   Special Requests   Final    BOTTLES DRAWN AEROBIC AND ANAEROBIC Blood Culture adequate volume   Culture   Final    NO GROWTH 4 DAYS Performed at [facility], [address], [place], [place] [address]    Report Status PENDING  Incomplete  Resp Panel by RT-PCR (Flu A&B, Covid) Nasopharyngeal Swab     Status: None   Collection Time: 12/18/20 10:24 AM   Specimen: Nasopharyngeal Swab; Nasopharyngeal(NP) swabs in vial transport medium  Result Value Ref Range Status   SARS Coronavirus 2 by RT PCR NEGATIVE NEGATIVE Final    Comment: (NOTE) SARS-CoV-2 target nucleic acids are NOT DETECTED.  The SARS-CoV-2 RNA is generally detectable in upper respiratory specimens during the acute phase of infection. The lowest concentration of SARS-CoV-2 viral copies this assay can detect is 138 copies/mL. A negative result does not preclude SARS-Cov-2 infection and should not be used as the sole basis for treatment or other patient management decisions. A negative result may occur with  improper specimen collection/handling, submission of specimen other than nasopharyngeal swab, presence of viral mutation(s) within the areas targeted by this assay, and inadequate number of viral copies(<138 copies/mL). A negative result  must be combined with clinical observations, patient history, and epidemiological information. The expected result is Negative.  Fact Sheet for Patients:  EntrepreneurPulse.com.au  Fact Sheet for Healthcare Providers:  IncredibleEmployment.be  This test is no t yet approved or cleared by the [place] FDA and  has been authorized for detection and/or diagnosis of SARS-CoV-2 by FDA under an Emergency Use Authorization (EUA). This EUA will remain  in effect (meaning this test can be used) for the duration of the COVID-19  declaration under Section 564(b)(1) of the Act, 21 U.S.C.section 360bbb-3(b)(1), unless the authorization is terminated  or revoked sooner.       Influenza A by PCR NEGATIVE NEGATIVE Final   Influenza B by PCR NEGATIVE NEGATIVE Final    Comment: (NOTE) The Xpert Xpress SARS-CoV-2/FLU/RSV plus assay is intended as an aid in the diagnosis of influenza from Nasopharyngeal swab specimens and should not be used as a sole basis for treatment. Nasal washings and aspirates are unacceptable for Xpert Xpress SARS-CoV-2/FLU/RSV testing.  Fact Sheet for Patients: EntrepreneurPulse.com.au  Fact Sheet for Healthcare Providers: IncredibleEmployment.be  This test is not yet approved or cleared by the [place] FDA and has been authorized for detection and/or diagnosis of SARS-CoV-2 by FDA under an Emergency Use Authorization (EUA). This EUA will remain in effect (meaning this test can be used) for the duration of the COVID-19 declaration under Section 564(b)(1) of the Act, 21 U.S.C. section 360bbb-3(b)(1), unless the authorization is terminated or revoked.  Performed at [facility], [address] [place], [place] [address]   Culture, blood (routine x 2)     Status: None (Preliminary result)   Collection Time: 12/18/20 11:06 AM   Specimen: BLOOD  Result Value Ref Range Status   Specimen Description BLOOD LEFT WRIST  Final   Special Requests   Final    BOTTLES DRAWN AEROBIC AND ANAEROBIC Blood Culture adequate volume   Culture   Final    NO GROWTH 4 DAYS Performed at [facility], [address], [place], [place] [address]    Report Status PENDING  Incomplete  Urine Culture     Status: Abnormal   Collection Time: 12/18/20  1:55 PM   Specimen: Urine, Catheterized  Result Value Ref Range Status   Specimen Description   Final    URINE, CATHETERIZED Performed at [facility], [address], [place], [place]  [address]    Special Requests   Final    NONE Performed at [facility], [address], [place], [place] [address]    Culture (A)  Final    70,000 COLONIES/mL YEAST Standardized susceptibility testing for this organism is not available. Performed at [facility], [place] [address], [place], [place] [address]    Report Status 12/20/2020 FINAL  Final  Resp Panel by RT-PCR (Flu A&B, Covid) Nasopharyngeal Swab     Status: None   Collection Time: 12/22/20 11:19 AM   Specimen: Nasopharyngeal Swab; Nasopharyngeal(NP) swabs in vial transport medium  Result Value Ref Range Status   SARS Coronavirus 2 by RT PCR NEGATIVE NEGATIVE Final    Comment: (NOTE) SARS-CoV-2 target nucleic acids are NOT DETECTED.  The SARS-CoV-2 RNA is generally detectable in upper respiratory specimens during the acute phase of infection. The lowest concentration of SARS-CoV-2 viral copies this assay can detect is 138 copies/mL. A negative result does not preclude SARS-Cov-2 infection and should not be used as the sole basis for treatment or other patient management decisions. A  negative result may occur with  improper specimen collection/handling, submission of specimen other than nasopharyngeal swab, presence of viral mutation(s) within the areas targeted by this assay, and inadequate number of viral copies(<138 copies/mL). A negative result must be combined with clinical observations, patient history, and epidemiological information. The expected result is Negative.  Fact Sheet for Patients:  EntrepreneurPulse.com.au  Fact Sheet for Healthcare Providers:  IncredibleEmployment.be  This test is no t yet approved or cleared by the [place] FDA and  has been authorized for detection and/or diagnosis of SARS-CoV-2 by FDA under an Emergency Use Authorization (EUA). This EUA will remain  in effect (meaning this test can be used) for the duration of the COVID-19  declaration under Section 564(b)(1) of the Act, 21 U.S.C.section 360bbb-3(b)(1), unless the authorization is terminated  or revoked sooner.       Influenza A by PCR NEGATIVE NEGATIVE Final   Influenza B by PCR NEGATIVE NEGATIVE Final    Comment: (NOTE) The Xpert Xpress SARS-CoV-2/FLU/RSV plus assay is intended as an aid in the diagnosis of influenza from Nasopharyngeal swab specimens and should not be used as a sole basis for treatment. Nasal washings and aspirates are unacceptable for Xpert Xpress SARS-CoV-2/FLU/RSV testing.  Fact Sheet for Patients: EntrepreneurPulse.com.au  Fact Sheet for Healthcare Providers: IncredibleEmployment.be  This test is not yet approved or cleared by the [place] FDA and has been authorized for detection and/or diagnosis of SARS-CoV-2 by FDA under an Emergency Use Authorization (EUA). This EUA will remain in effect (meaning this test can be used) for the duration of the COVID-19 declaration under Section 564(b)(1) of the Act, 21 U.S.C. section 360bbb-3(b)(1), unless the authorization is terminated or revoked.  Performed at [facility], [address], [place], [place] [address]       Management plans discussed with the patient, family and they are in agreement.  CODE STATUS:     Code Status Orders  (From admission, onward)           Start     Ordered   12/18/20 1305  Full code  Continuous        12/18/20 1307           Code Status History     Date Active Date Inactive Code Status Order ID Comments User Context   11/17/2020 2034 12/01/2020 2138 Full Code [id number]  Criss Alvine, DO ED   08/19/2020 1711 08/27/2020 2305 Full Code [id number]  Hessie Knows, MD Inpatient   11/15/2019 2354 11/24/2019 2255 Full Code [id number]  Elwyn Reach, MD ED   12/30/2016 1848 01/05/2017 2200 Full Code [id number]  Newman Pies, MD Inpatient      Advance Directive Documentation    Flowsheet  Row Most Recent Value  Type of Advance Directive Healthcare Power of Attorney  Pre-existing out of facility DNR order (yellow form or pink MOST form) --  "MOST" Form in Place? --       TOTAL TIME TAKING CARE OF THIS PATIENT: 32 minutes.    Loletha Grayer M.D on 12/22/2020 at 2:10 PM    [facility]  CC: Primary care physician; Andrea Sprout, FNP

## 2020-12-22 NOTE — Care Management Important Message (Signed)
Important Message  Patient Details  Name: Andrea [name] MRN: [id number] Date of Birth: [birth year]   Medicare Important Message Given:  Yes     Loann Quill 12/22/2020, 1:53 PM

## 2020-12-22 NOTE — Progress Notes (Signed)
Patient discharging to [facility] room 706. Called report x2 with no answer. Patient leaving with PICC line in place because she will be getting long term IV antibiotics. Removed wound vac and placed a wet to dry dressing on heel. The facility will place their wound vac on patient when patient gets to facility. All paperwork in packet. Patient will be transported via EMS.

## 2020-12-22 NOTE — TOC Transition Note (Signed)
Transition of Care [facility]) - CM/SW Discharge Note   Patient Details  Name: Andrea [name] MRN: [id number] Date of Birth: 06-19-1945  Transition of Care [facility]) CM/SW Contact:  Shelbie Hutching, RN Phone Number: 12/22/2020, 2:51 PM   Clinical Narrative:    Patient is medically cleared for discharge to [facility] today.  Insurance authorization has been approved from today through 10/19.  Patient and family aware of DC, RNCM will arrange EMS transport as soon as bedside RN has removed wound vac and placed dressing to left heel.     Final next level of care: Skilled Nursing Facility Barriers to Discharge: Barriers Resolved   Patient Goals and CMS Choice Patient states their goals for this hospitalization and ongoing recovery are:: Daughter wants to find out what is going on with the patient and ultimate goal for her to get better and return [facility] for further [facility]hab CMS Medicare.gov Compare Post Acute Care list provided to:: Patient Represenative (must comment) Choice offered to / list presented to : Adult Children  Discharge Placement              Patient chooses bed at: [facility] [place] Patient to be transferred to facility by: [facility] Name of family member notified: Laury Axon Patient and family notified of of transfer: 12/22/20  Discharge Plan and Services   Discharge Planning Services: CM Consult Post Acute Care Choice: [place]          DME Arranged: N/A DME Agency: NA       HH Arranged: NA HH Agency: NA        Social Determinants of Health (SDOH) Interventions     Readmission Risk Interventions Readmission Risk Prevention Plan 12/19/2020 08/21/2020  Transportation Screening Complete Complete  PCP or Specialist Appt within 5-7 Days - Complete  PCP or Specialist Appt within 3-5 Days Complete -  Home Care Screening - Complete  Medication Review (RN CM) - Complete  HRI or Home Care Consult Complete -  Social Work Consult for  [place] Planning/Counseling Complete -  Palliative Care Screening Not Applicable -  Medication Review Press photographer) Complete -  Some recent data might be hidden

## 2020-12-22 NOTE — Consult Note (Signed)
WOC notified of patient's DC to SNF today.  Orders in the computer to have NPWT dressing removed and saline moist dressing placed for DC to SNF, the machine they use is not compatible with our 71M/Acelity NPWT dressing.  The SNF to replace dressing and their unit upon her arrival  [facility], [place], [place]

## 2020-12-23 DIAGNOSIS — E559 Vitamin D deficiency, unspecified: Secondary | ICD-10-CM | POA: Diagnosis not present

## 2020-12-23 DIAGNOSIS — G8929 Other chronic pain: Secondary | ICD-10-CM | POA: Diagnosis not present

## 2020-12-23 DIAGNOSIS — E785 Hyperlipidemia, unspecified: Secondary | ICD-10-CM | POA: Diagnosis not present

## 2020-12-23 DIAGNOSIS — E1122 Type 2 diabetes mellitus with diabetic chronic kidney disease: Secondary | ICD-10-CM | POA: Diagnosis not present

## 2020-12-23 DIAGNOSIS — I130 Hypertensive heart and chronic kidney disease with heart failure and stage 1 through stage 4 chronic kidney disease, or unspecified chronic kidney disease: Secondary | ICD-10-CM | POA: Diagnosis not present

## 2020-12-23 DIAGNOSIS — A419 Sepsis, unspecified organism: Secondary | ICD-10-CM | POA: Diagnosis not present

## 2020-12-23 DIAGNOSIS — K2100 Gastro-esophageal reflux disease with esophagitis, without bleeding: Secondary | ICD-10-CM | POA: Diagnosis not present

## 2020-12-23 LAB — CULTURE, BLOOD (ROUTINE X 2)
Culture: NO GROWTH
Culture: NO GROWTH
Special Requests: ADEQUATE
Special Requests: ADEQUATE

## 2020-12-25 ENCOUNTER — Ambulatory Visit (INDEPENDENT_AMBULATORY_CARE_PROVIDER_SITE_OTHER): Payer: Medicare Other

## 2020-12-25 DIAGNOSIS — Z794 Long term (current) use of insulin: Secondary | ICD-10-CM

## 2020-12-25 NOTE — Chronic Care Management (AMB) (Signed)
Chronic Care Management   CCM RN Visit Note  12/25/2020 Name: Andrea [name] MRN: [id number] DOB: 1945-04-07  Subjective: Andrea [name] is a 75 y.o. year old female who is a primary care patient of [name], FNP. The care management team was consulted for assistance with disease management and care coordination needs.    Engaged with patient's [name] by telephone for follow up visit in response to provider referral for case management and care coordination services.   Consent to Services:  The patient was given information about Chronic Care Management services, agreed to services, and gave verbal consent prior to initiation of services.  Please see initial visit note for detailed documentation.    Assessment: Review of patient past medical history, allergies, medications, health status, including review of consultants reports, laboratory and other test data, was performed as part of comprehensive evaluation and provision of chronic care management services.   SDOH (Social Determinants of Health) assessments and interventions performed: No  CCM Care Plan  Allergies  Allergen Reactions   Chlorhexidine    Metformin And Related Diarrhea   Sulfa Antibiotics Rash    Outpatient Encounter Medications as of 12/25/2020  Medication Sig   acetaminophen (TYLENOL) 325 MG tablet Take 2 tablets (650 mg total) by mouth every 6 (six) hours as needed for mild pain (or Fever >/= 101).   Alcohol Swabs (ALCOHOL PREP) PADS Use twice daily prior to SQ injection of insulin to clean skin   atorvastatin (LIPITOR) 20 MG tablet Take 1 tablet (20 mg total) by mouth daily. (Patient taking differently: Take 20 mg by mouth at bedtime.)   Calcium Carbonate-Vitamin D 600-400 MG-UNIT tablet Take 1 tablet by mouth 2 (two) times daily.   cefTAZidime (FORTAZ) IVPB Inject 2 g into the vein every 12 (twelve) hours for 6 days. Indication:  L heel ulcer and osteomyelitis Last Day of Therapy:   12/28/2020 Labs - Once weekly:  CBC/D, CMP, CK, ESR   Cyanocobalamin (B-12) 1000 MCG CAPS Take 1,000 mcg by mouth daily.    daptomycin (CUBICIN) IVPB Inject 400 mg into the vein daily. Indication:  Left heel ulcer with osteomyelitis First Dose: Yes Last Day of Therapy:  12/28/2020 Labs - Once weekly:  CBC/D, CMP, CK, ESR Method of administration: IV Push Method of administration may be changed at the discretion of home infusion pharmacist based upon assessment of the patient and/or caregiver's ability to self-administer the medication ordered.   hydrOXYzine (ATARAX/VISTARIL) 25 MG tablet Take 25 mg by mouth daily.   Insulin Pen Needle (TRUEPLUS PEN NEEDLES) 31G X 6 MM MISC Use with lantus two times daily.   INSULIN SYRINGE 1CC/29G 29G X 1/2" 1 ML MISC For lantus injections twice daily   LANTUS SOLOSTAR 100 UNIT/ML Solostar Pen Inject 14 Units into the skin at bedtime.   metoprolol succinate (TOPROL-XL) 25 MG 24 hr tablet TAKE 1 TABLET BY MOUTH EVERY DAY   Multiple Vitamins-Minerals (PRESERVISION AREDS 2 PO) Take 1 tablet by mouth in the morning and at bedtime.   Omega-3 1000 MG CAPS Take 1,000 mg by mouth daily.   omeprazole (PRILOSEC) 40 MG capsule TAKE 1 CAPSULE BY MOUTH EVERY DAY   OneTouch Delica Lancets 47Q MISC To check blood sugar daily  DX: E11.9   ONETOUCH VERIO test strip TO CHECK BLOOD SUGAR ONCE DAILY.   oxyCODONE (OXY IR/ROXICODONE) 5 MG immediate release tablet Take 1 tablet (5 mg total) by mouth every 6 (six) hours as needed for severe pain  or moderate pain.   sertraline (ZOLOFT) 25 MG tablet Take 25 mg by mouth daily.   TRULICITY 1.5 LI/1.0VU SOPN INJECT 1.5MG INTO THE SKIN ONCE A WEEK AS DIRECTED (Patient taking differently: On friday)   vitamin C (ASCORBIC ACID) 500 MG tablet Take 500 mg by mouth 2 (two) times daily.   Vitamin D, Ergocalciferol, (DRISDOL) 1.25 MG (50000 UNIT) CAPS capsule TAKE ONE CAPSULE BY MOUTH ONCE WEEKLY ON THE SAME DAY EACH WEEK (ON FRIDAYS) (Patient  taking differently: Take 50,000 Units by mouth every 7 (seven) days.)   zinc gluconate 50 MG tablet Take 50 mg by mouth daily.   No facility-administered encounter medications on file as of 12/25/2020.    Patient Active Problem List   Diagnosis Date Noted   Hypomagnesemia    Hypokalemia    Essential hypertension    Tachycardia    Depression    Acute metabolic encephalopathy 13/14/3888   Acute kidney injury superimposed on CKD ([place]) 12/18/2020   Delirium 75/79/7282   Chronic systolic CHF (congestive heart failure) ([place]) 12/18/2020   Osteomyelitis of foot, left, acute ([place]) 12/18/2020   Status post peripherally inserted central catheter (PICC) central line placement    Osteomyelitis ([place]) 11/17/2020   Cellulitis of left lower extremity 11/13/2020   Hypoglycemia 11/13/2020   Non healing left heel wound 11/13/2020   Status post total hip replacement, 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   Pain due to onychomycosis of toenail of left foot 03/06/2020   Malaise and fatigue 12/28/2019   UTI (urinary tract infection) 12/28/2019   Hyponatremia 12/27/2019   Intertrigo 12/27/2019   Acute respiratory failure with hypoxia ([place]) 11/23/2019   History of meningitis 11/23/2019   CSF leak from nose 11/22/2019   History of myocardial infarction 03/09/2019   PAD (peripheral artery disease) ([place]) 12/28/2018   Moderate episode of recurrent major depressive disorder ([place]) 12/19/2017   Leg length discrepancy 09/21/2017   Coronary artery disease involving native coronary artery 01/06/2017   Drug-induced constipation 01/06/2017   Spinal stenosis of lumbar region 01/06/2017   Spondylolisthesis of lumbar region 12/30/2016   Obese 04/16/2015   History of colon polyps 11/23/2012   Bundle branch block, 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   Personal history of transient ischemic attack (TIA), and cerebral infarction without residual deficits 11/23/2012   Encounter for long-term (current) use of aspirin 11/23/2012   Status post percutaneous  transluminal coronary angioplasty 11/23/2012   Transient ischemic attack (TIA), and cerebral infarction without residual deficits(V12.54) 11/23/2012   Anemia 08/17/2012   Chronic obstructive pulmonary disease ([place]) 08/17/2012   HLD (hyperlipidemia) 08/17/2012   OP (osteoporosis) 08/17/2012   Type 2 diabetes mellitus with hyperlipidemia ([place]) 08/17/2012   Ischemic heart disease due to coronary artery obstruction ([place]) 07/24/2012    Outreach with Mrs. Jannifer Franklin daughter/caregiver [name]. Mrs. Cragin was recently hospitalized d/t altered mental status. [name] reports she was discharged to [facility] for rehabilitation and IV antibiotics. [name] agreed to call and provide an update when she receives additional information regarding anticipated discharge and possible in-home needs.   PLAN Anticipate outreach within the next month.   Cristy [facility] Care Management [facility] [phone]

## 2020-12-29 DIAGNOSIS — E1122 Type 2 diabetes mellitus with diabetic chronic kidney disease: Secondary | ICD-10-CM | POA: Diagnosis not present

## 2020-12-29 DIAGNOSIS — I5030 Unspecified diastolic (congestive) heart failure: Secondary | ICD-10-CM | POA: Diagnosis not present

## 2020-12-29 DIAGNOSIS — I130 Hypertensive heart and chronic kidney disease with heart failure and stage 1 through stage 4 chronic kidney disease, or unspecified chronic kidney disease: Secondary | ICD-10-CM | POA: Diagnosis not present

## 2020-12-29 DIAGNOSIS — M86179 Other acute osteomyelitis, unspecified ankle and foot: Secondary | ICD-10-CM | POA: Diagnosis not present

## 2020-12-30 ENCOUNTER — Other Ambulatory Visit: Payer: Self-pay

## 2020-12-30 ENCOUNTER — Ambulatory Visit (INDEPENDENT_AMBULATORY_CARE_PROVIDER_SITE_OTHER): Payer: Medicare Other | Admitting: Podiatry

## 2020-12-30 DIAGNOSIS — L89620 Pressure ulcer of left heel, unstageable: Secondary | ICD-10-CM | POA: Diagnosis not present

## 2020-12-30 DIAGNOSIS — E113293 Type 2 diabetes mellitus with mild nonproliferative diabetic retinopathy without macular edema, bilateral: Secondary | ICD-10-CM

## 2020-12-30 DIAGNOSIS — Z794 Long term (current) use of insulin: Secondary | ICD-10-CM

## 2020-12-31 ENCOUNTER — Telehealth: Payer: Self-pay | Admitting: *Deleted

## 2020-12-31 NOTE — Telephone Encounter (Signed)
"  I'm calling in regards to Andrea [name].  I'm calling to see what her updated weightbearing status is.  She had an appointment yesterday.  On the note, it just says weightbearing and post-op boot.  We need to know if that is weightbearing as tolerated or partial weightbearing.  I you would, give [place] a follow-up call.  Leave [place] a message if we're away or ask for Brittney in therapy or Janett Billow in therapy."

## 2020-12-31 NOTE — Telephone Encounter (Signed)
Weightbearing as tolerated in a postsurgical shoe, not a CAM boot.  Thanks, Dr. Amalia Hailey

## 2021-01-01 ENCOUNTER — Telehealth: Payer: Self-pay

## 2021-01-01 ENCOUNTER — Telehealth: Payer: Self-pay | Admitting: *Deleted

## 2021-01-01 DIAGNOSIS — E1122 Type 2 diabetes mellitus with diabetic chronic kidney disease: Secondary | ICD-10-CM | POA: Diagnosis not present

## 2021-01-01 DIAGNOSIS — I5030 Unspecified diastolic (congestive) heart failure: Secondary | ICD-10-CM | POA: Diagnosis not present

## 2021-01-01 DIAGNOSIS — K2100 Gastro-esophageal reflux disease with esophagitis, without bleeding: Secondary | ICD-10-CM | POA: Diagnosis not present

## 2021-01-01 DIAGNOSIS — M86179 Other acute osteomyelitis, unspecified ankle and foot: Secondary | ICD-10-CM | POA: Diagnosis not present

## 2021-01-01 NOTE — Telephone Encounter (Signed)
I left a message on Andrea [name]'s voicemail.  Informed her that Dr. Amalia Hailey said Andrea [name] is do weightbearing in a post surgical shoe as tolerated.  She's not to do it in the boot.  I asked them to call us back if they have any further questions.

## 2021-01-01 NOTE — Telephone Encounter (Signed)
Called to schedule follow up and Andrea [name] the daughter states Podiatry cleared her for weight bearing and foot looks great. PICC line was removed by [facility] 12/28/2020. Advised ok to wait and see her as planned 01/27/2021.

## 2021-01-01 NOTE — Telephone Encounter (Signed)
"  I need to know her weight bearing status.  Give me a call back."  I left a message on Anne's voicemail.  Informed her that Dr. Amalia Hailey said Laryn Venning is do weightbearing in a post surgical shoe as tolerated.  She's not to do it in the boot.  I asked them to call us back if they have any further questions.

## 2021-01-05 ENCOUNTER — Telehealth: Payer: Self-pay | Admitting: *Deleted

## 2021-01-05 DIAGNOSIS — Z794 Long term (current) use of insulin: Secondary | ICD-10-CM | POA: Diagnosis not present

## 2021-01-05 DIAGNOSIS — E113293 Type 2 diabetes mellitus with mild nonproliferative diabetic retinopathy without macular edema, bilateral: Secondary | ICD-10-CM | POA: Diagnosis not present

## 2021-01-05 NOTE — Telephone Encounter (Signed)
"  What's her weight bearing status?  She recently came into the office on 10/25.  It's unclear whether she is partial weight bearing or weight bearing as tolerated."  I left a message on Andrea [name]'s voicemail.  I informed her that I had left a message on this past Friday on Andrea [name]'s voicemail.  Informed her that Dr. Amalia Hailey said Andrea [name] is do weightbearing in a post surgical shoe as tolerated.  She's not to do it in the boot.  I asked them to call us back if they have any further questions.

## 2021-01-06 DIAGNOSIS — E1122 Type 2 diabetes mellitus with diabetic chronic kidney disease: Secondary | ICD-10-CM | POA: Diagnosis not present

## 2021-01-06 DIAGNOSIS — I5030 Unspecified diastolic (congestive) heart failure: Secondary | ICD-10-CM | POA: Diagnosis not present

## 2021-01-06 DIAGNOSIS — M86179 Other acute osteomyelitis, unspecified ankle and foot: Secondary | ICD-10-CM | POA: Diagnosis not present

## 2021-01-06 DIAGNOSIS — K2100 Gastro-esophageal reflux disease with esophagitis, without bleeding: Secondary | ICD-10-CM | POA: Diagnosis not present

## 2021-01-09 DIAGNOSIS — I5030 Unspecified diastolic (congestive) heart failure: Secondary | ICD-10-CM | POA: Diagnosis not present

## 2021-01-09 DIAGNOSIS — M86179 Other acute osteomyelitis, unspecified ankle and foot: Secondary | ICD-10-CM | POA: Diagnosis not present

## 2021-01-09 DIAGNOSIS — K2100 Gastro-esophageal reflux disease with esophagitis, without bleeding: Secondary | ICD-10-CM | POA: Diagnosis not present

## 2021-01-09 DIAGNOSIS — E1122 Type 2 diabetes mellitus with diabetic chronic kidney disease: Secondary | ICD-10-CM | POA: Diagnosis not present

## 2021-01-11 NOTE — Progress Notes (Signed)
   Subjective:  Patient presents today status post partial posterior calcanectomy with placement of antibiotic beads and application of wound graft left.  DOS: 11/20/2020.  Patient continues to have some pain and tenderness to the posterior heel.  She presents today wearing her wound VAC.  She is also concomitantly being managed with infectious disease for IV antibiotics.  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   Depression    Diabetes mellitus type 2, uncomplicated ([place])    Diabetic retinopathy ([place])    Dupuytren contracture 2011   s/p surgery to LEFT hand   Frequent urinary tract infections    GERD (gastroesophageal reflux disease)    Grade I diastolic dysfunction    HTN (hypertension)    Hyperlipidemia, unspecified    Irritable bowel syndrome    Meningitis 11/2019   MI (myocardial infarction) ([place]) 1994   no stent   Neuromuscular disorder ([place])    nerve pain in back and legs   Osteoporosis    Pneumonia 2021   RBBB    Sepsis ([place]) 11/2019   Skin cancer    TIA (transient ischemic attack) 2014   Vitamin D deficiency, unspecified    Wheezing         Objective/Physical Exam Neurovascular status intact.  No edema noted.  No erythema.  The posterior wound appears stable.  There is exposed OsteoSet absorbable antibiotic beads protruding from the wound site.  Please see attached photo above.  There is minimal maceration around the area.  Overall the wound appears healthy and much smaller.  The wound only measures about 2.0x2.0.   Assessment: 1. s/p partial calcanectomy with placement of antibiotic beads and application of wound VAC left.. DOS: 11/20/2020   Plan of Care:  1. Patient was evaluated. 2.   Medically necessary excisional debridement including subcutaneous tissue and deep fascial tissue was performed using a tissue nipper.  Excisional debridement of all necrotic nonviable tissue down to healthier bleeding viable tissue was performed with postdebridement measurement same as pre- 3.  Patient may discontinue the wound VAC 4.  Recommend collagen Prisma and dry sterile dressing daily 5.  Patient may be weightbearing in postsurgical shoe 6.  Return to clinic in 4 weeks   Edrick Kins, DPM [facility]  Dr. Edrick Kins, DPM    [address], [place] [address]                Office [phone]  Fax [phone]

## 2021-01-12 DIAGNOSIS — E1122 Type 2 diabetes mellitus with diabetic chronic kidney disease: Secondary | ICD-10-CM | POA: Diagnosis not present

## 2021-01-12 DIAGNOSIS — K2100 Gastro-esophageal reflux disease with esophagitis, without bleeding: Secondary | ICD-10-CM | POA: Diagnosis not present

## 2021-01-12 DIAGNOSIS — M86179 Other acute osteomyelitis, unspecified ankle and foot: Secondary | ICD-10-CM | POA: Diagnosis not present

## 2021-01-12 DIAGNOSIS — G9341 Metabolic encephalopathy: Secondary | ICD-10-CM | POA: Diagnosis not present

## 2021-01-12 DIAGNOSIS — I5030 Unspecified diastolic (congestive) heart failure: Secondary | ICD-10-CM | POA: Diagnosis not present

## 2021-01-15 DIAGNOSIS — M86179 Other acute osteomyelitis, unspecified ankle and foot: Secondary | ICD-10-CM | POA: Diagnosis not present

## 2021-01-15 DIAGNOSIS — E1122 Type 2 diabetes mellitus with diabetic chronic kidney disease: Secondary | ICD-10-CM | POA: Diagnosis not present

## 2021-01-15 DIAGNOSIS — K2100 Gastro-esophageal reflux disease with esophagitis, without bleeding: Secondary | ICD-10-CM | POA: Diagnosis not present

## 2021-01-15 DIAGNOSIS — M6281 Muscle weakness (generalized): Secondary | ICD-10-CM | POA: Diagnosis not present

## 2021-01-15 DIAGNOSIS — I5030 Unspecified diastolic (congestive) heart failure: Secondary | ICD-10-CM | POA: Diagnosis not present

## 2021-01-16 ENCOUNTER — Telehealth: Payer: Self-pay

## 2021-01-16 DIAGNOSIS — M6281 Muscle weakness (generalized): Secondary | ICD-10-CM | POA: Diagnosis not present

## 2021-01-16 DIAGNOSIS — M86179 Other acute osteomyelitis, unspecified ankle and foot: Secondary | ICD-10-CM | POA: Diagnosis not present

## 2021-01-16 DIAGNOSIS — E1122 Type 2 diabetes mellitus with diabetic chronic kidney disease: Secondary | ICD-10-CM | POA: Diagnosis not present

## 2021-01-16 NOTE — Telephone Encounter (Signed)
Andrea [name] advised

## 2021-01-16 NOTE — Telephone Encounter (Signed)
Copied from [place] [id number]. Topic: General - Other >> Jan 16, 2021 12:50 PM Parke Poisson wrote: Reason for [id number] from [facility] called asking if you would be wiling to sign orders for home health care. Pt needs PT,OT and nursing. She was just discharged form hospital and [facility] can go out today but needs approval. Karen's  call back number is [phone]

## 2021-01-19 ENCOUNTER — Telehealth: Payer: Self-pay | Admitting: Family Medicine

## 2021-01-19 ENCOUNTER — Telehealth: Payer: Self-pay

## 2021-01-19 ENCOUNTER — Telehealth: Payer: Self-pay | Admitting: Podiatry

## 2021-01-19 DIAGNOSIS — Z96642 Presence of left artificial hip joint: Secondary | ICD-10-CM

## 2021-01-19 DIAGNOSIS — R2242 Localized swelling, mass and lump, left lower limb: Secondary | ICD-10-CM

## 2021-01-19 DIAGNOSIS — S91302A Unspecified open wound, left foot, initial encounter: Secondary | ICD-10-CM

## 2021-01-19 DIAGNOSIS — E113293 Type 2 diabetes mellitus with mild nonproliferative diabetic retinopathy without macular edema, bilateral: Secondary | ICD-10-CM

## 2021-01-19 DIAGNOSIS — L03116 Cellulitis of left lower limb: Secondary | ICD-10-CM

## 2021-01-19 DIAGNOSIS — Z794 Long term (current) use of insulin: Secondary | ICD-10-CM

## 2021-01-19 NOTE — Telephone Encounter (Signed)
Patient called listed and advised Trazodone is not on current or past medication list. She says while she was at rehab they gave it to her there for sleep. She doesn't know the dosage. I advised if she can find it to call back and it will be sent to provider for review for refill. I advised other medications requested will be sent for refill to provider. Advised an appointment for discussion of medications as per her daughter request when I called the cell number, patient verbalized understanding. Patient says she was quarantined for COVID x 10 days that ended on 01/15/21, unable to schedule due to this. I called the office and spoke to [place], [facility] who scheduled for Friday 01/23/21 at 0940 with [name], [place]. Patient advised of the appointment. She says she will call back once she reviews her discharge papers with the trazodone dosage. Advised give dosage and how it was given.

## 2021-01-19 NOTE — Telephone Encounter (Signed)
Patient has made an additional call about this medication  The patient shares that they've been previously prescribed 25 MG Trazodone

## 2021-01-19 NOTE — Telephone Encounter (Signed)
Copied from [place] [phone]. Topic: Referral - Request for Referral >> Jan 19, 2021 12:13 PM Tessa Lerner A wrote: Has patient seen PCP for this complaint? Yes.   *If NO, is insurance requiring patient see PCP for this issue before PCP can refer them? Referral for which specialty: [place] Preferred provider/office: [facility]  Reason for referral: Patient would like OT and PT

## 2021-01-19 NOTE — Telephone Encounter (Signed)
error 

## 2021-01-19 NOTE — Telephone Encounter (Signed)
Patient asking for short supply of  (ZOLOFT) 25 MG tablet hydrOXYzine (ATARAX/VISTARIL) 25 MG tablet  Patient would also like Trazodone refilled but not sure dosage or when last refill was, until refills come in , ent to  [id number] - [place], [place] Phone:  [phone]  Fax:  [phone]

## 2021-01-19 NOTE — Telephone Encounter (Signed)
Medication Refill - Medication: (ZOLOFT) 25 MG tablet hydrOXYzine (ATARAX/VISTARIL) 25 MG tablet  Patient would also like Trazodone refilled but not sure dosage or when last refill was.    Has the patient contacted their pharmacy? Yes.   Was advised to call office Preferred Pharmacy (with phone number or street name):  [id number] - [place], [place] Phone:  [phone]  Fax:  [phone]     Has the patient been seen for an appointment in the last year OR does the patient have an upcoming appointment? Yes.  11/13/20  Agent: Please be advised that RX refills may take up to 3 business days. We ask that you follow-up with your pharmacy.

## 2021-01-19 NOTE — Telephone Encounter (Signed)
That's fine if we have it in stock she can come into the front and pick some up. - Dr. Amalia Hailey

## 2021-01-19 NOTE — Telephone Encounter (Signed)
Copied from [place] [phone]. Topic: General - Other >> Jan 19, 2021 10:37 AM Bayard Beaver wrote: Reason for [id number] askng for home health orders of PT and OT. pPease call back

## 2021-01-19 NOTE — Telephone Encounter (Signed)
Puracol plus can she get some more of them for her mother  till her appt to see you

## 2021-01-20 ENCOUNTER — Other Ambulatory Visit: Payer: Self-pay | Admitting: Family Medicine

## 2021-01-20 DIAGNOSIS — F5101 Primary insomnia: Secondary | ICD-10-CM

## 2021-01-20 DIAGNOSIS — F419 Anxiety disorder, unspecified: Secondary | ICD-10-CM

## 2021-01-20 MED ORDER — SERTRALINE HCL 25 MG PO TABS: 25.0000 mg | ORAL_TABLET | Freq: Every day | ORAL | 1 refills | Status: DC

## 2021-01-20 MED ORDER — TRAZODONE HCL 100 MG PO TABS
100.0000 mg | ORAL_TABLET | Freq: Every day | ORAL | 1 refills | Status: DC
Start: 2021-01-20 — End: 2021-01-23

## 2021-01-20 MED ORDER — HYDROXYZINE HCL 25 MG PO TABS: 25.0000 mg | ORAL_TABLET | Freq: Three times a day (TID) | ORAL | 3 refills | Status: DC | PRN

## 2021-01-20 NOTE — Telephone Encounter (Signed)
Please review referral pended. KW

## 2021-01-21 DIAGNOSIS — E11319 Type 2 diabetes mellitus with unspecified diabetic retinopathy without macular edema: Secondary | ICD-10-CM | POA: Diagnosis not present

## 2021-01-21 DIAGNOSIS — I130 Hypertensive heart and chronic kidney disease with heart failure and stage 1 through stage 4 chronic kidney disease, or unspecified chronic kidney disease: Secondary | ICD-10-CM | POA: Diagnosis not present

## 2021-01-21 DIAGNOSIS — M810 Age-related osteoporosis without current pathological fracture: Secondary | ICD-10-CM | POA: Diagnosis not present

## 2021-01-21 DIAGNOSIS — N189 Chronic kidney disease, unspecified: Secondary | ICD-10-CM | POA: Diagnosis not present

## 2021-01-21 DIAGNOSIS — E1140 Type 2 diabetes mellitus with diabetic neuropathy, unspecified: Secondary | ICD-10-CM | POA: Diagnosis not present

## 2021-01-21 DIAGNOSIS — M6281 Muscle weakness (generalized): Secondary | ICD-10-CM | POA: Diagnosis not present

## 2021-01-21 DIAGNOSIS — E1169 Type 2 diabetes mellitus with other specified complication: Secondary | ICD-10-CM | POA: Diagnosis not present

## 2021-01-21 DIAGNOSIS — M86172 Other acute osteomyelitis, left ankle and foot: Secondary | ICD-10-CM | POA: Diagnosis not present

## 2021-01-21 DIAGNOSIS — L89620 Pressure ulcer of left heel, unstageable: Secondary | ICD-10-CM | POA: Diagnosis not present

## 2021-01-21 DIAGNOSIS — Z8616 Personal history of COVID-19: Secondary | ICD-10-CM | POA: Diagnosis not present

## 2021-01-21 DIAGNOSIS — E785 Hyperlipidemia, unspecified: Secondary | ICD-10-CM | POA: Diagnosis not present

## 2021-01-21 DIAGNOSIS — I2510 Atherosclerotic heart disease of native coronary artery without angina pectoris: Secondary | ICD-10-CM | POA: Diagnosis not present

## 2021-01-21 DIAGNOSIS — Z87440 Personal history of urinary (tract) infections: Secondary | ICD-10-CM | POA: Diagnosis not present

## 2021-01-21 DIAGNOSIS — I5042 Chronic combined systolic (congestive) and diastolic (congestive) heart failure: Secondary | ICD-10-CM | POA: Diagnosis not present

## 2021-01-21 DIAGNOSIS — N3281 Overactive bladder: Secondary | ICD-10-CM | POA: Diagnosis not present

## 2021-01-21 DIAGNOSIS — J449 Chronic obstructive pulmonary disease, unspecified: Secondary | ICD-10-CM | POA: Diagnosis not present

## 2021-01-21 DIAGNOSIS — K219 Gastro-esophageal reflux disease without esophagitis: Secondary | ICD-10-CM | POA: Diagnosis not present

## 2021-01-21 DIAGNOSIS — E1165 Type 2 diabetes mellitus with hyperglycemia: Secondary | ICD-10-CM | POA: Diagnosis not present

## 2021-01-21 DIAGNOSIS — E1122 Type 2 diabetes mellitus with diabetic chronic kidney disease: Secondary | ICD-10-CM | POA: Diagnosis not present

## 2021-01-21 DIAGNOSIS — G4700 Insomnia, unspecified: Secondary | ICD-10-CM | POA: Diagnosis not present

## 2021-01-21 DIAGNOSIS — M1990 Unspecified osteoarthritis, unspecified site: Secondary | ICD-10-CM | POA: Diagnosis not present

## 2021-01-21 DIAGNOSIS — K589 Irritable bowel syndrome without diarrhea: Secondary | ICD-10-CM | POA: Diagnosis not present

## 2021-01-22 ENCOUNTER — Other Ambulatory Visit: Payer: Self-pay | Admitting: Physician Assistant

## 2021-01-22 ENCOUNTER — Telehealth: Payer: Self-pay | Admitting: Family Medicine

## 2021-01-22 ENCOUNTER — Other Ambulatory Visit: Payer: Self-pay | Admitting: Cardiovascular Disease

## 2021-01-22 DIAGNOSIS — Z8616 Personal history of COVID-19: Secondary | ICD-10-CM | POA: Diagnosis not present

## 2021-01-22 DIAGNOSIS — L89620 Pressure ulcer of left heel, unstageable: Secondary | ICD-10-CM | POA: Diagnosis not present

## 2021-01-22 DIAGNOSIS — M1990 Unspecified osteoarthritis, unspecified site: Secondary | ICD-10-CM | POA: Diagnosis not present

## 2021-01-22 DIAGNOSIS — Z87440 Personal history of urinary (tract) infections: Secondary | ICD-10-CM | POA: Diagnosis not present

## 2021-01-22 DIAGNOSIS — I5042 Chronic combined systolic (congestive) and diastolic (congestive) heart failure: Secondary | ICD-10-CM | POA: Diagnosis not present

## 2021-01-22 DIAGNOSIS — E1142 Type 2 diabetes mellitus with diabetic polyneuropathy: Secondary | ICD-10-CM

## 2021-01-22 DIAGNOSIS — N189 Chronic kidney disease, unspecified: Secondary | ICD-10-CM | POA: Diagnosis not present

## 2021-01-22 DIAGNOSIS — M6281 Muscle weakness (generalized): Secondary | ICD-10-CM | POA: Diagnosis not present

## 2021-01-22 DIAGNOSIS — E1140 Type 2 diabetes mellitus with diabetic neuropathy, unspecified: Secondary | ICD-10-CM | POA: Diagnosis not present

## 2021-01-22 DIAGNOSIS — I130 Hypertensive heart and chronic kidney disease with heart failure and stage 1 through stage 4 chronic kidney disease, or unspecified chronic kidney disease: Secondary | ICD-10-CM | POA: Diagnosis not present

## 2021-01-22 DIAGNOSIS — E1165 Type 2 diabetes mellitus with hyperglycemia: Secondary | ICD-10-CM | POA: Diagnosis not present

## 2021-01-22 DIAGNOSIS — J449 Chronic obstructive pulmonary disease, unspecified: Secondary | ICD-10-CM | POA: Diagnosis not present

## 2021-01-22 DIAGNOSIS — M810 Age-related osteoporosis without current pathological fracture: Secondary | ICD-10-CM | POA: Diagnosis not present

## 2021-01-22 DIAGNOSIS — E1169 Type 2 diabetes mellitus with other specified complication: Secondary | ICD-10-CM | POA: Diagnosis not present

## 2021-01-22 DIAGNOSIS — I2510 Atherosclerotic heart disease of native coronary artery without angina pectoris: Secondary | ICD-10-CM | POA: Diagnosis not present

## 2021-01-22 DIAGNOSIS — N3281 Overactive bladder: Secondary | ICD-10-CM | POA: Diagnosis not present

## 2021-01-22 DIAGNOSIS — G4700 Insomnia, unspecified: Secondary | ICD-10-CM | POA: Diagnosis not present

## 2021-01-22 DIAGNOSIS — K589 Irritable bowel syndrome without diarrhea: Secondary | ICD-10-CM | POA: Diagnosis not present

## 2021-01-22 DIAGNOSIS — E785 Hyperlipidemia, unspecified: Secondary | ICD-10-CM | POA: Diagnosis not present

## 2021-01-22 DIAGNOSIS — M86172 Other acute osteomyelitis, left ankle and foot: Secondary | ICD-10-CM | POA: Diagnosis not present

## 2021-01-22 DIAGNOSIS — E1122 Type 2 diabetes mellitus with diabetic chronic kidney disease: Secondary | ICD-10-CM | POA: Diagnosis not present

## 2021-01-22 DIAGNOSIS — K219 Gastro-esophageal reflux disease without esophagitis: Secondary | ICD-10-CM | POA: Diagnosis not present

## 2021-01-22 DIAGNOSIS — E11319 Type 2 diabetes mellitus with unspecified diabetic retinopathy without macular edema: Secondary | ICD-10-CM | POA: Diagnosis not present

## 2021-01-22 NOTE — Telephone Encounter (Signed)
Home Health Verbal Orders - Caller/Agency: [place] Number: [phone]  Requesting OT/PT/Skilled Nursing/Social Work/Speech Therapy: Skilled Nursing, PT, OT  Frequency:  Skilled Nursing-1w8 PT and OT: will call back w/ frequency

## 2021-01-22 NOTE — Telephone Encounter (Signed)
Okay to approve orders/ KW

## 2021-01-22 NOTE — Telephone Encounter (Signed)
See message below. KW 

## 2021-01-23 ENCOUNTER — Encounter: Payer: Self-pay | Admitting: Family Medicine

## 2021-01-23 ENCOUNTER — Other Ambulatory Visit: Payer: Self-pay

## 2021-01-23 ENCOUNTER — Ambulatory Visit (INDEPENDENT_AMBULATORY_CARE_PROVIDER_SITE_OTHER): Payer: Medicare Other | Admitting: Family Medicine

## 2021-01-23 VITALS — BP 128/61 | HR 107 | Wt 121.1 lb

## 2021-01-23 DIAGNOSIS — R630 Anorexia: Secondary | ICD-10-CM | POA: Diagnosis not present

## 2021-01-23 DIAGNOSIS — S91302A Unspecified open wound, left foot, initial encounter: Secondary | ICD-10-CM | POA: Diagnosis not present

## 2021-01-23 DIAGNOSIS — R634 Abnormal weight loss: Secondary | ICD-10-CM | POA: Diagnosis not present

## 2021-01-23 DIAGNOSIS — F5101 Primary insomnia: Secondary | ICD-10-CM | POA: Insufficient documentation

## 2021-01-23 DIAGNOSIS — Z09 Encounter for follow-up examination after completed treatment for conditions other than malignant neoplasm: Secondary | ICD-10-CM | POA: Diagnosis not present

## 2021-01-23 MED ORDER — TRAZODONE HCL 100 MG PO TABS: 50.0000 mg | ORAL_TABLET | Freq: Every day | ORAL | 1 refills | Status: DC

## 2021-01-23 NOTE — Assessment & Plan Note (Signed)
Stable at this time [facility] and Abx beads have been removed Continues to have some drainage on dressing when changing Overall much improved

## 2021-01-23 NOTE — Telephone Encounter (Signed)
Verbal orders given. KW

## 2021-01-23 NOTE — Progress Notes (Signed)
**Note De-Identified Andrea Obfuscation** Established patient visit   Patient: Andrea [name]   DOB: 1945-07-16   75 y.o. Female  MRN: [id number] Visit Date: 01/23/2021  Today's healthcare provider: [name], FNP   Chief Complaint  Patient presents with   Diabetes   Follow-up   Subjective    HPI  Diabetes Mellitus Type II, Follow-up  Lab Results  Component Value Date   HGBA1C 7.5 (H) 11/17/2020   HGBA1C 7.3 (A) 05/15/2020   HGBA1C 7.5 (H) 02/11/2020   Wt Readings from Last 3 Encounters:  01/23/21 121 lb 1.6 oz (54.9 kg)  12/19/20 137 lb 12.6 oz (62.5 kg)  12/16/20 138 lb (62.6 kg)   Last seen for diabetes 2 months ago.  Management since then includes refill on new dose of long acting insulin. She reports excellent compliance with treatment. She is not having side effects.  Symptoms: No fatigue No foot ulcerations  No appetite changes No nausea  Yes paresthesia of the feet  No polydipsia  No polyuria No visual disturbances   No vomiting     Home blood sugar records:  reading this morning in the 170s  Episodes of hypoglycemia? No    Current insulin regiment: lantus solostar 40qhs Most Recent Eye Exam: >12 months  Current exercise: no regular exercise Current diet habits: well balanced  Pertinent Labs: Lab Results  Component Value Date   CHOL 115 02/11/2020   HDL 67 02/11/2020   LDLCALC 29 02/11/2020   TRIG 105 02/11/2020   CHOLHDL 1.7 02/11/2020   Lab Results  Component Value Date   NA 142 12/22/2020   K 3.7 12/22/2020   CREATININE 1.22 (H) 12/22/2020   GFRNONAA 46 (L) 12/22/2020   MICROALBUR 20 12/16/2016     ---------------------------------------------------------------------------------------------------  Follow up for Cellulitis of left lower extremity  The patient was last seen for this 2 months ago. Changes made at last visit include referrral to wound clinic, ordered Keflex 500mg .  She reports excellent compliance with treatment. She feels that condition is  Improved. She is not having side effects.   -----------------------------------------------------------------------------------------     Medications: Outpatient Medications Prior to Visit  Medication Sig   acetaminophen (TYLENOL) 325 MG tablet Take 2 tablets (650 mg total) by mouth every 6 (six) hours as needed for mild pain (or Fever >/= 101).   Alcohol Swabs (ALCOHOL PREP) PADS Use twice daily prior to SQ injection of insulin to clean skin   atorvastatin (LIPITOR) 20 MG tablet Take 1 tablet (20 mg total) by mouth daily. (Patient taking differently: Take 20 mg by mouth at bedtime.)   Calcium Carbonate-Vitamin D 600-400 MG-UNIT tablet Take 1 tablet by mouth 2 (two) times daily.   Cyanocobalamin (B-12) 1000 MCG CAPS Take 1,000 mcg by mouth daily.    hydrOXYzine (ATARAX/VISTARIL) 25 MG tablet Take 1 tablet (25 mg total) by mouth 3 (three) times daily as needed.   Insulin Pen Needle (TRUEPLUS PEN NEEDLES) 31G X 6 MM MISC Use with lantus two times daily.   INSULIN SYRINGE 1CC/29G 29G X 1/2" 1 ML MISC For lantus injections twice daily   LANTUS SOLOSTAR 100 UNIT/ML Solostar Pen Inject 14 Units into the skin at bedtime.   metoprolol succinate (TOPROL-XL) 25 MG 24 hr tablet TAKE 1 TABLET BY MOUTH EVERY DAY   Multiple Vitamins-Minerals (PRESERVISION AREDS 2 PO) Take 1 tablet by mouth in the morning and at bedtime.   Omega-3 1000 MG CAPS Take 1,000 mg by mouth daily.   omeprazole (  PRILOSEC) 40 MG capsule TAKE 1 CAPSULE BY MOUTH EVERY DAY   OneTouch Delica Lancets 78E MISC To check blood sugar daily  DX: E11.9   ONETOUCH VERIO test strip TO CHECK BLOOD SUGAR ONCE DAILY.   oxyCODONE (OXY IR/ROXICODONE) 5 MG immediate release tablet Take 1 tablet (5 mg total) by mouth every 6 (six) hours as needed for severe pain or moderate pain.   potassium chloride (KLOR-CON) 10 MEQ tablet Take 10 mEq by mouth daily.   sertraline (ZOLOFT) 25 MG tablet Take 1 tablet (25 mg total) by mouth daily.   TRULICITY 1.5  UM/3.5TI SOPN INJECT 1.5MG  INTO THE SKIN ONCE A WEEK AS DIRECTED (Patient taking differently: On friday)   vitamin C (ASCORBIC ACID) 500 MG tablet Take 500 mg by mouth 2 (two) times daily.   Vitamin D, Ergocalciferol, (DRISDOL) 1.25 MG (50000 UNIT) CAPS capsule TAKE ONE CAPSULE BY MOUTH ONCE WEEKLY ON THE SAME DAY EACH WEEK (ON FRIDAYS) (Patient taking differently: Take 50,000 Units by mouth every 7 (seven) days.)   zinc gluconate 50 MG tablet Take 50 mg by mouth daily.   [DISCONTINUED] traZODone (DESYREL) 100 MG tablet Take 1 tablet (100 mg total) by mouth at bedtime.   No facility-administered medications prior to visit.    Review of Systems     Objective    BP 128/61   Pulse (!) 107   Wt 121 lb 1.6 oz (54.9 kg)   BMI 23.65 kg/m    Physical Exam Vitals (in [facility] for exam) and nursing note reviewed. Exam conducted with a chaperone present.  Constitutional:      General: She is not in acute distress.    Appearance: Normal appearance. She is normal weight. She is not ill-appearing, toxic-appearing or diaphoretic.  HENT:     Head: Normocephalic and atraumatic.  Cardiovascular:     Rate and Rhythm: Normal rate and regular rhythm.     Pulses: Normal pulses.     Heart sounds: Normal heart sounds. No murmur heard.   No friction rub. No gallop.  Pulmonary:     Effort: Pulmonary effort is normal. No respiratory distress.     Breath sounds: Normal breath sounds. No stridor. No wheezing, rhonchi or rales.  Chest:     Chest wall: No tenderness.  Abdominal:     General: Bowel sounds are normal.     Palpations: Abdomen is soft.  Musculoskeletal:        General: No swelling, tenderness, deformity or signs of injury. Normal range of motion.     Right lower leg: No edema.     Left lower leg: No edema.  Feet:     Comments: Off loading boot on L foot Skin:    General: Skin is warm and dry.     Capillary Refill: Capillary refill takes less than 2 seconds.     Coloration: Skin is not  jaundiced or pale.     Findings: No bruising, erythema, lesion or rash.  Neurological:     General: No focal deficit present.     Mental Status: She is alert and oriented to person, place, and time. Mental status is at baseline.     Cranial Nerves: No cranial nerve deficit.     Sensory: No sensory deficit.     Motor: No weakness.     Coordination: Coordination normal.  Psychiatric:        Mood and Affect: Mood normal.        Behavior: Behavior normal.  Thought Content: Thought content normal.        Judgment: Judgment normal.      No results found for any visits on 01/23/21.  Assessment & Plan     Problem List Items Addressed This Visit       Other   Non healing left heel wound    Stable at this time [place] and Abx beads have been removed Continues to have some drainage on dressing when changing Overall much improved      Hospital discharge follow-up - Primary    Recent d/c from SNF; doing well- home health faculties have begun Question regarding agencies Pt encouraged to follow-up with insurance if concerned over agency Agreed to go ahead with current agency given they have already started      Weight loss, unintentional    2 samples of protein shakes provided; encouraged to eat smaller, protein rich meals per day Encouraged not to skip meals given hx of DM      Primary insomnia    Request in change of trazodone to 50 mg given daytime fatigue Rx changed      Relevant Medications   traZODone (DESYREL) 100 MG tablet   Loss of appetite for more than 2 weeks    Noted no meals today; 1 meal yesterday Discussed appetite stimulants Advised to eat small, healthy meals Ex- tuna, hard boiled eggs, cereal/milk, pb/toast        Return in about 4 weeks (around 02/20/2021) for T2DM management, weight check.      Vonna Kotyk, FNP, have reviewed all documentation for this visit. The documentation on 01/23/21 for the exam, diagnosis, procedures, and orders are  all accurate and complete.    [name], [place] [phone] (phone) [phone] (fax)  [place]

## 2021-01-23 NOTE — Assessment & Plan Note (Signed)
Request in change of trazodone to 50 mg given daytime fatigue Rx changed

## 2021-01-23 NOTE — Assessment & Plan Note (Signed)
2 samples of protein shakes provided; encouraged to eat smaller, protein rich meals per day Encouraged not to skip meals given hx of DM

## 2021-01-23 NOTE — Assessment & Plan Note (Signed)
Noted no meals today; 1 meal yesterday Discussed appetite stimulants Advised to eat small, healthy meals Ex- tuna, hard boiled eggs, cereal/milk, pb/toast

## 2021-01-23 NOTE — Assessment & Plan Note (Signed)
Recent d/c from SNF; doing well- home health faculties have begun Question regarding agencies Pt encouraged to follow-up with insurance if concerned over agency Agreed to go ahead with current agency given they have already started

## 2021-01-26 ENCOUNTER — Telehealth: Payer: Self-pay | Admitting: Family Medicine

## 2021-01-26 ENCOUNTER — Ambulatory Visit: Payer: Self-pay | Admitting: *Deleted

## 2021-01-26 DIAGNOSIS — L89620 Pressure ulcer of left heel, unstageable: Secondary | ICD-10-CM | POA: Diagnosis not present

## 2021-01-26 DIAGNOSIS — M810 Age-related osteoporosis without current pathological fracture: Secondary | ICD-10-CM | POA: Diagnosis not present

## 2021-01-26 DIAGNOSIS — G4700 Insomnia, unspecified: Secondary | ICD-10-CM | POA: Diagnosis not present

## 2021-01-26 DIAGNOSIS — Z87440 Personal history of urinary (tract) infections: Secondary | ICD-10-CM | POA: Diagnosis not present

## 2021-01-26 DIAGNOSIS — K219 Gastro-esophageal reflux disease without esophagitis: Secondary | ICD-10-CM | POA: Diagnosis not present

## 2021-01-26 DIAGNOSIS — Z8616 Personal history of COVID-19: Secondary | ICD-10-CM | POA: Diagnosis not present

## 2021-01-26 DIAGNOSIS — E1169 Type 2 diabetes mellitus with other specified complication: Secondary | ICD-10-CM | POA: Diagnosis not present

## 2021-01-26 DIAGNOSIS — E1140 Type 2 diabetes mellitus with diabetic neuropathy, unspecified: Secondary | ICD-10-CM | POA: Diagnosis not present

## 2021-01-26 DIAGNOSIS — E1122 Type 2 diabetes mellitus with diabetic chronic kidney disease: Secondary | ICD-10-CM | POA: Diagnosis not present

## 2021-01-26 DIAGNOSIS — M1990 Unspecified osteoarthritis, unspecified site: Secondary | ICD-10-CM | POA: Diagnosis not present

## 2021-01-26 DIAGNOSIS — I2510 Atherosclerotic heart disease of native coronary artery without angina pectoris: Secondary | ICD-10-CM | POA: Diagnosis not present

## 2021-01-26 DIAGNOSIS — K589 Irritable bowel syndrome without diarrhea: Secondary | ICD-10-CM | POA: Diagnosis not present

## 2021-01-26 DIAGNOSIS — M86172 Other acute osteomyelitis, left ankle and foot: Secondary | ICD-10-CM | POA: Diagnosis not present

## 2021-01-26 DIAGNOSIS — J449 Chronic obstructive pulmonary disease, unspecified: Secondary | ICD-10-CM | POA: Diagnosis not present

## 2021-01-26 DIAGNOSIS — M6281 Muscle weakness (generalized): Secondary | ICD-10-CM | POA: Diagnosis not present

## 2021-01-26 DIAGNOSIS — E11319 Type 2 diabetes mellitus with unspecified diabetic retinopathy without macular edema: Secondary | ICD-10-CM | POA: Diagnosis not present

## 2021-01-26 DIAGNOSIS — I5042 Chronic combined systolic (congestive) and diastolic (congestive) heart failure: Secondary | ICD-10-CM | POA: Diagnosis not present

## 2021-01-26 DIAGNOSIS — E1165 Type 2 diabetes mellitus with hyperglycemia: Secondary | ICD-10-CM | POA: Diagnosis not present

## 2021-01-26 DIAGNOSIS — E785 Hyperlipidemia, unspecified: Secondary | ICD-10-CM | POA: Diagnosis not present

## 2021-01-26 DIAGNOSIS — N3281 Overactive bladder: Secondary | ICD-10-CM | POA: Diagnosis not present

## 2021-01-26 DIAGNOSIS — I130 Hypertensive heart and chronic kidney disease with heart failure and stage 1 through stage 4 chronic kidney disease, or unspecified chronic kidney disease: Secondary | ICD-10-CM | POA: Diagnosis not present

## 2021-01-26 DIAGNOSIS — N189 Chronic kidney disease, unspecified: Secondary | ICD-10-CM | POA: Diagnosis not present

## 2021-01-26 NOTE — Telephone Encounter (Signed)
error 

## 2021-01-26 NOTE — Telephone Encounter (Signed)
Please review chart and advise. KW 

## 2021-01-26 NOTE — Telephone Encounter (Signed)
Andrea [name] with [facility] would like to know if there are any weight bearing restrictions for the left leg?  Is it OK for transfers?  weight bearing at all? Pt has wound on left heal.  She would also like orders for PT and OT.  Cb [phone]

## 2021-01-26 NOTE — Telephone Encounter (Signed)
Call received from Otilio Jefferson from [facility] [phone] to report elevated pulse 114 bpm at rest today during functional assessment.  Caller not with patient at this time. No c/o chest pain, no altered mental status changes, alert and oriented x 3 per report. No drainage noted from wound and less pain than previously reported. B/P 124/62 R 17 temp 97.8. request if PCP would like to change parameters for call back report if heart rate greater than 100 bpm. Please contact Leann at [phone]. No other symptoms reported . Do you want triage of patient for elevated heart rate due to last documented pulse during OV at 107 bpm?

## 2021-01-27 ENCOUNTER — Ambulatory Visit (INDEPENDENT_AMBULATORY_CARE_PROVIDER_SITE_OTHER): Payer: Medicare Other | Admitting: Podiatry

## 2021-01-27 ENCOUNTER — Other Ambulatory Visit
Admission: RE | Admit: 2021-01-27 | Discharge: 2021-01-27 | Disposition: A | Discharge status: Home / Self Care | Payer: Medicare Other | Source: Ambulatory Visit | Attending: Infectious Diseases | Admitting: Infectious Diseases

## 2021-01-27 ENCOUNTER — Encounter: Payer: Self-pay | Admitting: Podiatry

## 2021-01-27 ENCOUNTER — Ambulatory Visit: Payer: Medicare Other | Attending: Infectious Diseases | Admitting: Infectious Diseases

## 2021-01-27 ENCOUNTER — Other Ambulatory Visit: Payer: Self-pay

## 2021-01-27 VITALS — BP 127/80 | HR 84 | Temp 97.8°F | Resp 17 | Ht 60.0 in | Wt 121.1 lb

## 2021-01-27 DIAGNOSIS — Z87891 Personal history of nicotine dependence: Secondary | ICD-10-CM | POA: Insufficient documentation

## 2021-01-27 DIAGNOSIS — S81802D Unspecified open wound, left lower leg, subsequent encounter: Secondary | ICD-10-CM | POA: Diagnosis not present

## 2021-01-27 DIAGNOSIS — E11621 Type 2 diabetes mellitus with foot ulcer: Secondary | ICD-10-CM | POA: Insufficient documentation

## 2021-01-27 DIAGNOSIS — E1169 Type 2 diabetes mellitus with other specified complication: Secondary | ICD-10-CM | POA: Insufficient documentation

## 2021-01-27 DIAGNOSIS — I5022 Chronic systolic (congestive) heart failure: Secondary | ICD-10-CM | POA: Diagnosis not present

## 2021-01-27 DIAGNOSIS — Y939 Activity, unspecified: Secondary | ICD-10-CM | POA: Insufficient documentation

## 2021-01-27 DIAGNOSIS — Z8661 Personal history of infections of the central nervous system: Secondary | ICD-10-CM | POA: Insufficient documentation

## 2021-01-27 DIAGNOSIS — E11319 Type 2 diabetes mellitus with unspecified diabetic retinopathy without macular edema: Secondary | ICD-10-CM | POA: Insufficient documentation

## 2021-01-27 DIAGNOSIS — Z9889 Other specified postprocedural states: Secondary | ICD-10-CM | POA: Diagnosis not present

## 2021-01-27 DIAGNOSIS — I2510 Atherosclerotic heart disease of native coronary artery without angina pectoris: Secondary | ICD-10-CM | POA: Insufficient documentation

## 2021-01-27 DIAGNOSIS — Z79899 Other long term (current) drug therapy: Secondary | ICD-10-CM | POA: Diagnosis not present

## 2021-01-27 DIAGNOSIS — L97422 Non-pressure chronic ulcer of left heel and midfoot with fat layer exposed: Secondary | ICD-10-CM

## 2021-01-27 DIAGNOSIS — Z794 Long term (current) use of insulin: Secondary | ICD-10-CM | POA: Diagnosis not present

## 2021-01-27 DIAGNOSIS — E785 Hyperlipidemia, unspecified: Secondary | ICD-10-CM | POA: Insufficient documentation

## 2021-01-27 DIAGNOSIS — Y929 Unspecified place or not applicable: Secondary | ICD-10-CM | POA: Diagnosis not present

## 2021-01-27 DIAGNOSIS — Z8673 Personal history of transient ischemic attack (TIA), and cerebral infarction without residual deficits: Secondary | ICD-10-CM | POA: Diagnosis not present

## 2021-01-27 DIAGNOSIS — Z09 Encounter for follow-up examination after completed treatment for conditions other than malignant neoplasm: Secondary | ICD-10-CM | POA: Insufficient documentation

## 2021-01-27 DIAGNOSIS — L97429 Non-pressure chronic ulcer of left heel and midfoot with unspecified severity: Secondary | ICD-10-CM | POA: Insufficient documentation

## 2021-01-27 DIAGNOSIS — I110 Hypertensive heart disease with heart failure: Secondary | ICD-10-CM | POA: Diagnosis not present

## 2021-01-27 DIAGNOSIS — E0843 Diabetes mellitus due to underlying condition with diabetic autonomic (poly)neuropathy: Secondary | ICD-10-CM

## 2021-01-27 DIAGNOSIS — Z23 Encounter for immunization: Secondary | ICD-10-CM | POA: Insufficient documentation

## 2021-01-27 DIAGNOSIS — Z96642 Presence of left artificial hip joint: Secondary | ICD-10-CM | POA: Diagnosis not present

## 2021-01-27 DIAGNOSIS — Z7901 Long term (current) use of anticoagulants: Secondary | ICD-10-CM | POA: Diagnosis not present

## 2021-01-27 NOTE — Telephone Encounter (Signed)
Lmtcb okay for [facility] nurse triage to advise of PCP response. KW

## 2021-01-27 NOTE — Telephone Encounter (Signed)
Osborne Casco, PT from [facility] calling to return message about pt's wb status, informed her per Elise's note today, pt is WBAT and knows limitations. She advised she will add this to pt's chart and no other questions/concerns noted.

## 2021-01-27 NOTE — Telephone Encounter (Signed)
Advised in Nurse Triage encounter dated 01/27/21.

## 2021-01-27 NOTE — Progress Notes (Signed)
NAME: Andrea [name]  DOB: 12/12/1945  MRN: [id number]  Date/Time: 01/27/2021 11:29 AM   Subjective:   Follow-up after recent hospitalization for a left heel wound.  Daughter is with the patient Andrea [name] is a 75 y.o. female with a history of diabetes mellitus, recent left hip replacement, hypertension, lipidemia, CAD, TIA, history of CSF rhinorrhea, strep pneumo meningitis in September 2021 followed by surgery to correct the CSF rhinorrhea was recently in the hospital for left heel ulcer and osteomyelitis.  She was admitted to [facility] between 11/17/2020 until 12/01/2020. As per patient she had left hip replacement on 08/19/2020.  The left heel was cut by the boot she had on in the OR.  She went to rehab a following that and the wound did not heal well.  She was seeing podiatrist as outpatient. She was on doxycycline and Keflex.  She got readmitted to the hospital on 11/17/2020 and underwent debridement of the ulcer with partial calcanectomy and wound VAC placement.  The bone culture was positive for Pseudomonas which was pansensitive.  No bone tissue was sent for histopathological examination.   She was discharged on cefepime and daptomycin to complete 6 weeks of treatment which finished on 12/29/20 She has been doing well The wound has almost healed except for a small slit Some yellow discharge-no pain or erythema Uses duracol dressing Patient has no pain now and her last dose was 2 weeks ago Pt has been home from SNF for the past 2 weeks   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   Depression    Diabetes mellitus type 2, uncomplicated ([place])    Diabetic retinopathy ([place])    Dupuytren contracture 2011   s/p surgery to LEFT hand   Frequent  urinary tract infections    GERD (gastroesophageal reflux disease)    Grade I diastolic dysfunction    HTN (hypertension)    Hyperlipidemia, unspecified    Irritable bowel syndrome    Meningitis 11/2019   MI (myocardial infarction) ([place]) 1994   no stent   Neuromuscular disorder (HCC)    nerve pain in back and legs   Osteoporosis    Pneumonia 2021   RBBB    Sepsis ([place]) 11/2019   Skin cancer    TIA (transient ischemic attack) 2014   Vitamin D deficiency, unspecified    Wheezing     Past Surgical History:  Procedure Laterality Date   BACK SURGERY     lower due to spinal stenosis   BONE BIOPSY Left 11/20/2020   Procedure: BONE BIOPSY;  Surgeon: Edrick Kins, DPM;  Location: [facility] ORS;  Service: Podiatry;  Laterality: Left;   CARDIAC CATHETERIZATION  1994   CATARACT EXTRACTION, BILATERAL Bilateral    COLONOSCOPY     x3   COLONOSCOPY WITH PROPOFOL N/A 02/08/2019   Procedure: COLONOSCOPY WITH PROPOFOL;  Surgeon: [name], MD;  Location: [facility] ENDOSCOPY;  Service: Gastroenterology;  Laterality: N/A;   CORONARY ANGIOPLASTY  1994   DUPUYTREN CONTRACTURE RELEASE Left 2011   HARDWARE REMOVAL Left 08/19/2020   Procedure: HARDWARE REMOVAL;  Surgeon: Hessie Knows, MD;  Location: [facility] ORS;  Service: Orthopedics;  Laterality: Left;   HIP  FRACTURE SURGERY  11/2013   IRRIGATION AND DEBRIDEMENT FOOT Left 11/20/2020   Procedure: IRRIGATION AND DEBRIDEMENT FOOT-Heel Ulcer;  Surgeon: Edrick Kins, DPM;  Location: [facility] ORS;  Service: Podiatry;  Laterality: Left;   PR COLSC FLX W/REMOVAL LESION BY HOT BX FORCEPS   08/21/2015   Procedure: COLONOSCOPY, FLEXIBLE, PROXIMAL TO SPLENIC FLEXURE; W/REMOVAL TUMOR/POLYP/OTHER LESION, HOT BX FORCEP/CAUTE; Surgeon: Carlena Hurl, MD; Location: [facility]; Service: General Surgery   REPAIR DURAL / CSF LEAK     to left sinus   TEE WITHOUT CARDIOVERSION N/A 11/20/2019   Procedure: TRANSESOPHAGEAL ECHOCARDIOGRAM (TEE);  Surgeon: Nelva Bush,  MD;  Location: [facility] ORS;  Service: Cardiovascular;  Laterality: N/A;   TOTAL HIP ARTHROPLASTY Left 08/19/2020   Procedure: TOTAL HIP ARTHROPLASTY ANTERIOR APPROACH;  Surgeon: Hessie Knows, MD;  Location: [facility] ORS;  Service: Orthopedics;  Laterality: Left;    Social History   Socioeconomic History   Marital status: Married    Spouse name: Roselie Awkward   Number of children: 3   Years of education: 8   Highest education level: 8th grade  Occupational History   Occupation: retired  Tobacco Use   Smoking status: Former    Packs/day: 1.00    Years: 30.00    Pack years: 30.00    Types: Cigarettes    Quit date: 07/17/2012    Years since quitting: 8.5   Smokeless tobacco: Never   Tobacco comments:       Vaping Use   Vaping Use: Never used  Substance and Sexual Activity   Alcohol use: Not Currently    Alcohol/week: 0.0 standard drinks   Drug use: No   Sexual activity: Not Currently  Other Topics Concern   Not on file  Social History Narrative   Not on file   Social Determinants of Health   Financial Resource Strain: Not on file  Food Insecurity: No Food Insecurity   Worried About Charity fundraiser in the Last Year: Never true   Ran Out of Food in the Last Year: Never true  Transportation Needs: Unknown   Lack of Transportation (Medical): No   Lack of Transportation (Non-Medical): Not on file  Physical Activity: Not on file  Stress: Not on file  Social Connections: Not on file  Intimate Partner Violence: Not on file    Family History  Problem Relation Age of Onset   Depression Daughter    Arthritis Daughter        spine   Plantar fasciitis Daughter    Anxiety disorder Daughter    Hyperlipidemia Daughter    AAA (abdominal aortic aneurysm) Daughter    Hypertension Mother    Stroke Mother    Cataracts Mother    Depression Mother    Heart disease Mother    Asthma Brother    Diabetes Daughter    Hyperlipidemia Daughter    Hypertension Daughter    Breast cancer Cousin         maternal   Allergies  Allergen Reactions   Chlorhexidine    Metformin And Related Diarrhea   Sulfa Antibiotics Rash   I? Current Outpatient Medications  Medication Sig Dispense Refill   acetaminophen (TYLENOL) 325 MG tablet Take 2 tablets (650 mg total) by mouth every 6 (six) hours as needed for mild pain (or Fever >/= 101).     Alcohol Swabs (ALCOHOL PREP) PADS Use twice daily prior to SQ injection of insulin to clean skin 100 each 6   atorvastatin (LIPITOR) 20 MG  tablet Take 1 tablet (20 mg total) by mouth daily. (Patient taking differently: Take 20 mg by mouth at bedtime.)     Calcium Carbonate-Vitamin D 600-400 MG-UNIT tablet Take 1 tablet by mouth 2 (two) times daily.     Cyanocobalamin (B-12) 1000 MCG CAPS Take 1,000 mcg by mouth daily.      hydrOXYzine (ATARAX/VISTARIL) 25 MG tablet Take 1 tablet (25 mg total) by mouth 3 (three) times daily as needed. 90 tablet 3   Insulin Pen Needle (TRUEPLUS PEN NEEDLES) 31G X 6 MM MISC Use with lantus two times daily. 100 each 3   INSULIN SYRINGE 1CC/29G 29G X 1/2" 1 ML MISC For lantus injections twice daily 200 each 1   LANTUS SOLOSTAR 100 UNIT/ML Solostar Pen Inject 14 Units into the skin at bedtime. 15 mL 3   metoprolol succinate (TOPROL-XL) 25 MG 24 hr tablet TAKE 1 TABLET BY MOUTH EVERY DAY 90 tablet 3   Multiple Vitamins-Minerals (PRESERVISION AREDS 2 PO) Take 1 tablet by mouth in the morning and at bedtime.     Omega-3 1000 MG CAPS Take 1,000 mg by mouth daily.     omeprazole (PRILOSEC) 40 MG capsule TAKE 1 CAPSULE BY MOUTH EVERY DAY 90 capsule 0   OneTouch Delica Lancets 94V MISC To check blood sugar daily  DX: E11.9 100 each 5   ONETOUCH VERIO test strip TO CHECK BLOOD SUGAR ONCE DAILY. 100 strip 11   oxyCODONE (OXY IR/ROXICODONE) 5 MG immediate release tablet Take 1 tablet (5 mg total) by mouth every 6 (six) hours as needed for severe pain or moderate pain. 12 tablet 0   potassium chloride (KLOR-CON) 10 MEQ tablet Take 10 mEq by  mouth daily.     sertraline (ZOLOFT) 25 MG tablet Take 1 tablet (25 mg total) by mouth daily. 90 tablet 1   traZODone (DESYREL) 100 MG tablet Take 0.5 tablets (50 mg total) by mouth at bedtime. 90 tablet 1   TRULICITY 1.5 OP/9.2TW SOPN INJECT 1.5MG  INTO THE SKIN ONCE A WEEK AS DIRECTED (Patient taking differently: On friday) 2 mL 2   vitamin C (ASCORBIC ACID) 500 MG tablet Take 500 mg by mouth 2 (two) times daily.     Vitamin D, Ergocalciferol, (DRISDOL) 1.25 MG (50000 UNIT) CAPS capsule TAKE ONE CAPSULE BY MOUTH ONCE WEEKLY ON THE SAME DAY EACH WEEK (ON FRIDAYS) (Patient taking differently: Take 50,000 Units by mouth every 7 (seven) days.) 12 capsule 1   zinc gluconate 50 MG tablet Take 50 mg by mouth daily.     No current facility-administered medications for this visit.     Abtx:  Anti-infectives (From admission, onward)    None       REVIEW OF SYSTEMS:  Const: negative fever, negative chills,  eating better Eyes: negative diplopia or visual changes, negative eye pain ENT: negative coryza, negative sore throat Resp: negative cough, hemoptysis, dyspnea Cards: negative for chest pain, palpitations, lower extremity edema GU: negative for frequency, dysuria and hematuria GI: Negative for abdominal pain, diarrhea, bleeding, constipation Skin: negative for rash and pruritus Heme: Seeing over her left shin area and a small wound on the right great toe likely from stubbing her foot MS: General weakness. Neurolo:negative for headaches, dizziness, vertigo, memory problems  Psych: negative for feelings of anxiety, depression  Endocrine: negative for thyroid, diabetes Allergy/Immunology-metformin and sulfa Objective:  VITALS:  PHYSICAL EXAM:  General: Alert, cooperative, no distress, appears stated age.  Head: Normocephalic, without obvious abnormality, atraumatic. Eyes: Conjunctivae clear,  anicteric sclerae. Pupils are equal Lungs: Clear to auscultation bilaterally. No Wheezing or  Rhonchi. No rales. Heart: Regular rate and rhythm, no murmur, rub or gallop. Abdomen: did not examine as in wheel chair Left heel area- small wound- does not probe to bone- some yellow discharge ( could be from duracol)  01/27/21     12/12/20   11/26/20   11/17/20   atraumatic, no cyanosis. No edema. No clubbing Skin: No rashes or lesions. Or bruising Lymph: Cervical, supraclavicular normal. Neurologic: Grossly non-focal  Impression/Recommendation Left heel ulcer infected wound.  Status post debridement and partial calcanectomy.pseudomonas in culture--finished 6 weeks of IV cefepime + dapto 12/29/20 Will check culture today as there is some discharge  Diabetes mellitus on insulin  Hypertension on losartan  HLD- pt to restart her 80mg  atorvastatin- was taking 20mg  when she was on daptomycin  Patient has a follow-up with Dr. Amalia Hailey today? Pt is asking for [place] booster- recived vaccine today  Will contact her with culture result and if needed would give antibiotics Will follow her PRN Discussed with patient and her daughter in great detail ___________________________________________________  Note:  This document was prepared using Dragon voice recognition software and may include unintentional dictation errors.

## 2021-01-27 NOTE — Patient Instructions (Signed)
You are here for follow up of left foot ulcer- you completed IV antibiotics a month ago- the wound is almost healed- minimal discharge- will get culture- today you asked for COVID booster- [place] bivalent vaccine was given Follow up as needed

## 2021-01-27 NOTE — Telephone Encounter (Signed)
Attempted to reach patient phone was disconnected, I advised home health nurse of pcp message below. If patient does return call back she is to be advised to make sure she is staying properly hydrated and drinking plenty of fluids throughout her day. KW

## 2021-01-28 DIAGNOSIS — E785 Hyperlipidemia, unspecified: Secondary | ICD-10-CM | POA: Diagnosis not present

## 2021-01-28 DIAGNOSIS — E11319 Type 2 diabetes mellitus with unspecified diabetic retinopathy without macular edema: Secondary | ICD-10-CM | POA: Diagnosis not present

## 2021-01-28 DIAGNOSIS — J449 Chronic obstructive pulmonary disease, unspecified: Secondary | ICD-10-CM | POA: Diagnosis not present

## 2021-01-28 DIAGNOSIS — I5042 Chronic combined systolic (congestive) and diastolic (congestive) heart failure: Secondary | ICD-10-CM | POA: Diagnosis not present

## 2021-01-28 DIAGNOSIS — M86172 Other acute osteomyelitis, left ankle and foot: Secondary | ICD-10-CM | POA: Diagnosis not present

## 2021-01-28 DIAGNOSIS — N189 Chronic kidney disease, unspecified: Secondary | ICD-10-CM | POA: Diagnosis not present

## 2021-01-28 DIAGNOSIS — L89620 Pressure ulcer of left heel, unstageable: Secondary | ICD-10-CM | POA: Diagnosis not present

## 2021-01-28 DIAGNOSIS — M810 Age-related osteoporosis without current pathological fracture: Secondary | ICD-10-CM | POA: Diagnosis not present

## 2021-01-28 DIAGNOSIS — N3281 Overactive bladder: Secondary | ICD-10-CM | POA: Diagnosis not present

## 2021-01-28 DIAGNOSIS — E1169 Type 2 diabetes mellitus with other specified complication: Secondary | ICD-10-CM | POA: Diagnosis not present

## 2021-01-28 DIAGNOSIS — K219 Gastro-esophageal reflux disease without esophagitis: Secondary | ICD-10-CM | POA: Diagnosis not present

## 2021-01-28 DIAGNOSIS — M6281 Muscle weakness (generalized): Secondary | ICD-10-CM | POA: Diagnosis not present

## 2021-01-28 DIAGNOSIS — K589 Irritable bowel syndrome without diarrhea: Secondary | ICD-10-CM | POA: Diagnosis not present

## 2021-01-28 DIAGNOSIS — Z87440 Personal history of urinary (tract) infections: Secondary | ICD-10-CM | POA: Diagnosis not present

## 2021-01-28 DIAGNOSIS — E1165 Type 2 diabetes mellitus with hyperglycemia: Secondary | ICD-10-CM | POA: Diagnosis not present

## 2021-01-28 DIAGNOSIS — I2510 Atherosclerotic heart disease of native coronary artery without angina pectoris: Secondary | ICD-10-CM | POA: Diagnosis not present

## 2021-01-28 DIAGNOSIS — M1990 Unspecified osteoarthritis, unspecified site: Secondary | ICD-10-CM | POA: Diagnosis not present

## 2021-01-28 DIAGNOSIS — I130 Hypertensive heart and chronic kidney disease with heart failure and stage 1 through stage 4 chronic kidney disease, or unspecified chronic kidney disease: Secondary | ICD-10-CM | POA: Diagnosis not present

## 2021-01-28 DIAGNOSIS — E1122 Type 2 diabetes mellitus with diabetic chronic kidney disease: Secondary | ICD-10-CM | POA: Diagnosis not present

## 2021-01-28 DIAGNOSIS — E1140 Type 2 diabetes mellitus with diabetic neuropathy, unspecified: Secondary | ICD-10-CM | POA: Diagnosis not present

## 2021-01-28 DIAGNOSIS — G4700 Insomnia, unspecified: Secondary | ICD-10-CM | POA: Diagnosis not present

## 2021-01-28 DIAGNOSIS — Z8616 Personal history of COVID-19: Secondary | ICD-10-CM | POA: Diagnosis not present

## 2021-01-31 LAB — AEROBIC CULTURE W GRAM STAIN (SUPERFICIAL SPECIMEN)

## 2021-02-03 ENCOUNTER — Other Ambulatory Visit: Payer: Self-pay | Admitting: Infectious Diseases

## 2021-02-03 ENCOUNTER — Telehealth: Payer: Self-pay

## 2021-02-03 MED ORDER — DOXYCYCLINE HYCLATE 100 MG PO TABS: 100.0000 mg | ORAL_TABLET | Freq: Two times a day (BID) | ORAL | 0 refills | Status: DC

## 2021-02-03 MED ORDER — AMOXICILLIN-POT CLAVULANATE 875-125 MG PO TABS
1.0000 | ORAL_TABLET | Freq: Two times a day (BID) | ORAL | 0 refills | Status: DC
Start: 2021-02-03 — End: 2021-06-02

## 2021-02-03 NOTE — Progress Notes (Signed)
MRSA and E.coli in wound culture- will prescribe DOXY+ augmentin for 2 weeks- informed patient

## 2021-02-03 NOTE — Progress Notes (Signed)
   Subjective:  Patient presents today status post partial posterior calcanectomy with placement of antibiotic beads and application of wound graft left.  DOS: 11/20/2020.  Patient doing well.  She continues to apply the collagen dressing that was provided here in the office.  She is concomitantly being managed with infectious disease for IV antibiotics.  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   Depression    Diabetes mellitus type 2, uncomplicated ([place])    Diabetic retinopathy ([place])    Dupuytren contracture 2011   s/p surgery to LEFT hand   Frequent urinary tract infections    GERD (gastroesophageal reflux disease)    Grade I diastolic dysfunction    HTN (hypertension)    Hyperlipidemia, unspecified    Irritable bowel syndrome    Meningitis 11/2019   MI (myocardial infarction) ([place]) 1994   no stent   Neuromuscular disorder ([place])    nerve pain in back and legs   Osteoporosis    Pneumonia 2021   RBBB    Sepsis ([place]) 11/2019   Skin cancer    TIA (transient ischemic attack) 2014   Vitamin D deficiency, unspecified    Wheezing      Objective/Physical Exam Neurovascular status intact.  No edema noted.  No erythema.  The posterior wound appears stable.  The wound continues to measure about 2.0 x 1.5 x 0.3 cm.  Wound base is granular and fibrotic.  No malodor noted.  There is no exposed bone muscle tendon ligament or joint.  Moderate serous drainage noted.  Periwound is intact  Assessment: 1. s/p partial calcanectomy with placement of antibiotic beads and application of wound VAC left.. DOS: 11/20/2020   Plan of Care:  1. Patient was evaluated. 2.  Medically necessary excisional debridement including  subcutaneous tissue and deep fascial tissue was performed using a tissue nipper.  Excisional debridement of all necrotic nonviable tissue down to healthier bleeding viable tissue was performed with postdebridement measurement same as pre- 3.  Continue collagen Prisma and dry sterile dressing daily 4.  Continue weightbearing in postsurgical shoe 5.  Return to clinic in 4 weeks for follow-up x-ray   Edrick Kins, DPM [facility]  Dr. Edrick Kins, DPM    [address], [place] [address]                Office [phone]  Fax [phone]

## 2021-02-03 NOTE — Telephone Encounter (Signed)
Per DR Ravishankar-Informed patient of wound culture showing positive MRSA. Explained that we will be sending Augmentin(Amox/clav) and Doxy that she will take both of these 2 times daily for a few weeks until she runs out. PAtient would like this to be sent to [place].

## 2021-02-04 ENCOUNTER — Telehealth: Payer: Self-pay

## 2021-02-04 DIAGNOSIS — E1140 Type 2 diabetes mellitus with diabetic neuropathy, unspecified: Secondary | ICD-10-CM | POA: Diagnosis not present

## 2021-02-04 DIAGNOSIS — E11319 Type 2 diabetes mellitus with unspecified diabetic retinopathy without macular edema: Secondary | ICD-10-CM | POA: Diagnosis not present

## 2021-02-04 DIAGNOSIS — E1169 Type 2 diabetes mellitus with other specified complication: Secondary | ICD-10-CM | POA: Diagnosis not present

## 2021-02-04 DIAGNOSIS — K589 Irritable bowel syndrome without diarrhea: Secondary | ICD-10-CM | POA: Diagnosis not present

## 2021-02-04 DIAGNOSIS — K219 Gastro-esophageal reflux disease without esophagitis: Secondary | ICD-10-CM | POA: Diagnosis not present

## 2021-02-04 DIAGNOSIS — I5042 Chronic combined systolic (congestive) and diastolic (congestive) heart failure: Secondary | ICD-10-CM | POA: Diagnosis not present

## 2021-02-04 DIAGNOSIS — J449 Chronic obstructive pulmonary disease, unspecified: Secondary | ICD-10-CM | POA: Diagnosis not present

## 2021-02-04 DIAGNOSIS — G4700 Insomnia, unspecified: Secondary | ICD-10-CM | POA: Diagnosis not present

## 2021-02-04 DIAGNOSIS — N3281 Overactive bladder: Secondary | ICD-10-CM | POA: Diagnosis not present

## 2021-02-04 DIAGNOSIS — N189 Chronic kidney disease, unspecified: Secondary | ICD-10-CM | POA: Diagnosis not present

## 2021-02-04 DIAGNOSIS — Z87440 Personal history of urinary (tract) infections: Secondary | ICD-10-CM | POA: Diagnosis not present

## 2021-02-04 DIAGNOSIS — M86172 Other acute osteomyelitis, left ankle and foot: Secondary | ICD-10-CM | POA: Diagnosis not present

## 2021-02-04 DIAGNOSIS — E1165 Type 2 diabetes mellitus with hyperglycemia: Secondary | ICD-10-CM | POA: Diagnosis not present

## 2021-02-04 DIAGNOSIS — E785 Hyperlipidemia, unspecified: Secondary | ICD-10-CM | POA: Diagnosis not present

## 2021-02-04 DIAGNOSIS — M6281 Muscle weakness (generalized): Secondary | ICD-10-CM | POA: Diagnosis not present

## 2021-02-04 DIAGNOSIS — M1990 Unspecified osteoarthritis, unspecified site: Secondary | ICD-10-CM | POA: Diagnosis not present

## 2021-02-04 DIAGNOSIS — I2510 Atherosclerotic heart disease of native coronary artery without angina pectoris: Secondary | ICD-10-CM | POA: Diagnosis not present

## 2021-02-04 DIAGNOSIS — M810 Age-related osteoporosis without current pathological fracture: Secondary | ICD-10-CM | POA: Diagnosis not present

## 2021-02-04 DIAGNOSIS — I130 Hypertensive heart and chronic kidney disease with heart failure and stage 1 through stage 4 chronic kidney disease, or unspecified chronic kidney disease: Secondary | ICD-10-CM | POA: Diagnosis not present

## 2021-02-04 DIAGNOSIS — L89620 Pressure ulcer of left heel, unstageable: Secondary | ICD-10-CM | POA: Diagnosis not present

## 2021-02-04 DIAGNOSIS — E1122 Type 2 diabetes mellitus with diabetic chronic kidney disease: Secondary | ICD-10-CM | POA: Diagnosis not present

## 2021-02-04 DIAGNOSIS — Z8616 Personal history of COVID-19: Secondary | ICD-10-CM | POA: Diagnosis not present

## 2021-02-04 NOTE — Telephone Encounter (Signed)
FYI. KW 

## 2021-02-04 NOTE — Telephone Encounter (Signed)
Copied from [place] [id number]. Topic: General - Other >> Feb 04, 2021  8:40 AM Alanda Slim E wrote: Reason for CRM: Pt just wanted to contact [name] and let her know that she was diagnosed with Cherylann Banas /FYI

## 2021-02-05 ENCOUNTER — Encounter: Payer: Self-pay | Admitting: Family Medicine

## 2021-02-05 ENCOUNTER — Other Ambulatory Visit: Payer: Self-pay | Admitting: Family Medicine

## 2021-02-05 DIAGNOSIS — Z22322 Carrier or suspected carrier of Methicillin resistant Staphylococcus aureus: Secondary | ICD-10-CM | POA: Insufficient documentation

## 2021-02-06 DIAGNOSIS — E785 Hyperlipidemia, unspecified: Secondary | ICD-10-CM | POA: Diagnosis not present

## 2021-02-06 DIAGNOSIS — N3281 Overactive bladder: Secondary | ICD-10-CM | POA: Diagnosis not present

## 2021-02-06 DIAGNOSIS — M6281 Muscle weakness (generalized): Secondary | ICD-10-CM | POA: Diagnosis not present

## 2021-02-06 DIAGNOSIS — K589 Irritable bowel syndrome without diarrhea: Secondary | ICD-10-CM | POA: Diagnosis not present

## 2021-02-06 DIAGNOSIS — I5042 Chronic combined systolic (congestive) and diastolic (congestive) heart failure: Secondary | ICD-10-CM | POA: Diagnosis not present

## 2021-02-06 DIAGNOSIS — G4700 Insomnia, unspecified: Secondary | ICD-10-CM | POA: Diagnosis not present

## 2021-02-06 DIAGNOSIS — N189 Chronic kidney disease, unspecified: Secondary | ICD-10-CM | POA: Diagnosis not present

## 2021-02-06 DIAGNOSIS — L89620 Pressure ulcer of left heel, unstageable: Secondary | ICD-10-CM | POA: Diagnosis not present

## 2021-02-06 DIAGNOSIS — M810 Age-related osteoporosis without current pathological fracture: Secondary | ICD-10-CM | POA: Diagnosis not present

## 2021-02-06 DIAGNOSIS — Z8616 Personal history of COVID-19: Secondary | ICD-10-CM | POA: Diagnosis not present

## 2021-02-06 DIAGNOSIS — I2510 Atherosclerotic heart disease of native coronary artery without angina pectoris: Secondary | ICD-10-CM | POA: Diagnosis not present

## 2021-02-06 DIAGNOSIS — J449 Chronic obstructive pulmonary disease, unspecified: Secondary | ICD-10-CM | POA: Diagnosis not present

## 2021-02-06 DIAGNOSIS — M86172 Other acute osteomyelitis, left ankle and foot: Secondary | ICD-10-CM | POA: Diagnosis not present

## 2021-02-06 DIAGNOSIS — E1122 Type 2 diabetes mellitus with diabetic chronic kidney disease: Secondary | ICD-10-CM | POA: Diagnosis not present

## 2021-02-06 DIAGNOSIS — E1140 Type 2 diabetes mellitus with diabetic neuropathy, unspecified: Secondary | ICD-10-CM | POA: Diagnosis not present

## 2021-02-06 DIAGNOSIS — K219 Gastro-esophageal reflux disease without esophagitis: Secondary | ICD-10-CM | POA: Diagnosis not present

## 2021-02-06 DIAGNOSIS — E1165 Type 2 diabetes mellitus with hyperglycemia: Secondary | ICD-10-CM | POA: Diagnosis not present

## 2021-02-06 DIAGNOSIS — Z87440 Personal history of urinary (tract) infections: Secondary | ICD-10-CM | POA: Diagnosis not present

## 2021-02-06 DIAGNOSIS — E1169 Type 2 diabetes mellitus with other specified complication: Secondary | ICD-10-CM | POA: Diagnosis not present

## 2021-02-06 DIAGNOSIS — I130 Hypertensive heart and chronic kidney disease with heart failure and stage 1 through stage 4 chronic kidney disease, or unspecified chronic kidney disease: Secondary | ICD-10-CM | POA: Diagnosis not present

## 2021-02-06 DIAGNOSIS — M1990 Unspecified osteoarthritis, unspecified site: Secondary | ICD-10-CM | POA: Diagnosis not present

## 2021-02-06 DIAGNOSIS — E11319 Type 2 diabetes mellitus with unspecified diabetic retinopathy without macular edema: Secondary | ICD-10-CM | POA: Diagnosis not present

## 2021-02-10 ENCOUNTER — Telehealth: Payer: Self-pay | Admitting: Podiatry

## 2021-02-10 ENCOUNTER — Telehealth: Payer: Self-pay | Admitting: *Deleted

## 2021-02-10 DIAGNOSIS — N3281 Overactive bladder: Secondary | ICD-10-CM | POA: Diagnosis not present

## 2021-02-10 DIAGNOSIS — E1140 Type 2 diabetes mellitus with diabetic neuropathy, unspecified: Secondary | ICD-10-CM | POA: Diagnosis not present

## 2021-02-10 DIAGNOSIS — Z8616 Personal history of COVID-19: Secondary | ICD-10-CM | POA: Diagnosis not present

## 2021-02-10 DIAGNOSIS — I2510 Atherosclerotic heart disease of native coronary artery without angina pectoris: Secondary | ICD-10-CM | POA: Diagnosis not present

## 2021-02-10 DIAGNOSIS — M1990 Unspecified osteoarthritis, unspecified site: Secondary | ICD-10-CM | POA: Diagnosis not present

## 2021-02-10 DIAGNOSIS — N189 Chronic kidney disease, unspecified: Secondary | ICD-10-CM | POA: Diagnosis not present

## 2021-02-10 DIAGNOSIS — M86172 Other acute osteomyelitis, left ankle and foot: Secondary | ICD-10-CM | POA: Diagnosis not present

## 2021-02-10 DIAGNOSIS — K219 Gastro-esophageal reflux disease without esophagitis: Secondary | ICD-10-CM | POA: Diagnosis not present

## 2021-02-10 DIAGNOSIS — E11319 Type 2 diabetes mellitus with unspecified diabetic retinopathy without macular edema: Secondary | ICD-10-CM | POA: Diagnosis not present

## 2021-02-10 DIAGNOSIS — E1122 Type 2 diabetes mellitus with diabetic chronic kidney disease: Secondary | ICD-10-CM | POA: Diagnosis not present

## 2021-02-10 DIAGNOSIS — I130 Hypertensive heart and chronic kidney disease with heart failure and stage 1 through stage 4 chronic kidney disease, or unspecified chronic kidney disease: Secondary | ICD-10-CM | POA: Diagnosis not present

## 2021-02-10 DIAGNOSIS — Z87440 Personal history of urinary (tract) infections: Secondary | ICD-10-CM | POA: Diagnosis not present

## 2021-02-10 DIAGNOSIS — E1165 Type 2 diabetes mellitus with hyperglycemia: Secondary | ICD-10-CM | POA: Diagnosis not present

## 2021-02-10 DIAGNOSIS — L89620 Pressure ulcer of left heel, unstageable: Secondary | ICD-10-CM | POA: Diagnosis not present

## 2021-02-10 DIAGNOSIS — I5042 Chronic combined systolic (congestive) and diastolic (congestive) heart failure: Secondary | ICD-10-CM | POA: Diagnosis not present

## 2021-02-10 DIAGNOSIS — K589 Irritable bowel syndrome without diarrhea: Secondary | ICD-10-CM | POA: Diagnosis not present

## 2021-02-10 DIAGNOSIS — M810 Age-related osteoporosis without current pathological fracture: Secondary | ICD-10-CM | POA: Diagnosis not present

## 2021-02-10 DIAGNOSIS — G4700 Insomnia, unspecified: Secondary | ICD-10-CM | POA: Diagnosis not present

## 2021-02-10 DIAGNOSIS — J449 Chronic obstructive pulmonary disease, unspecified: Secondary | ICD-10-CM | POA: Diagnosis not present

## 2021-02-10 DIAGNOSIS — E785 Hyperlipidemia, unspecified: Secondary | ICD-10-CM | POA: Diagnosis not present

## 2021-02-10 DIAGNOSIS — E1169 Type 2 diabetes mellitus with other specified complication: Secondary | ICD-10-CM | POA: Diagnosis not present

## 2021-02-10 DIAGNOSIS — M6281 Muscle weakness (generalized): Secondary | ICD-10-CM | POA: Diagnosis not present

## 2021-02-10 NOTE — Telephone Encounter (Signed)
Patients [name] called she wanted to let you know that the patient was positive for MRSA and on antibiotics.

## 2021-02-10 NOTE — Telephone Encounter (Signed)
I attempted to call Andrea [name] to inform her that she can come by to pick up her FMLA  paperwork.  She needs to sign the form as well prior to it being faxed.

## 2021-02-10 NOTE — Telephone Encounter (Signed)
Thank you for the update.-Dr. Amalia Hailey

## 2021-02-11 ENCOUNTER — Other Ambulatory Visit: Payer: Self-pay | Admitting: Family Medicine

## 2021-02-11 DIAGNOSIS — E1122 Type 2 diabetes mellitus with diabetic chronic kidney disease: Secondary | ICD-10-CM | POA: Diagnosis not present

## 2021-02-11 DIAGNOSIS — G4700 Insomnia, unspecified: Secondary | ICD-10-CM | POA: Diagnosis not present

## 2021-02-11 DIAGNOSIS — E1140 Type 2 diabetes mellitus with diabetic neuropathy, unspecified: Secondary | ICD-10-CM | POA: Diagnosis not present

## 2021-02-11 DIAGNOSIS — K589 Irritable bowel syndrome without diarrhea: Secondary | ICD-10-CM | POA: Diagnosis not present

## 2021-02-11 DIAGNOSIS — N3281 Overactive bladder: Secondary | ICD-10-CM | POA: Diagnosis not present

## 2021-02-11 DIAGNOSIS — Z8616 Personal history of COVID-19: Secondary | ICD-10-CM | POA: Diagnosis not present

## 2021-02-11 DIAGNOSIS — Z87440 Personal history of urinary (tract) infections: Secondary | ICD-10-CM | POA: Diagnosis not present

## 2021-02-11 DIAGNOSIS — J449 Chronic obstructive pulmonary disease, unspecified: Secondary | ICD-10-CM | POA: Diagnosis not present

## 2021-02-11 DIAGNOSIS — E11319 Type 2 diabetes mellitus with unspecified diabetic retinopathy without macular edema: Secondary | ICD-10-CM | POA: Diagnosis not present

## 2021-02-11 DIAGNOSIS — K219 Gastro-esophageal reflux disease without esophagitis: Secondary | ICD-10-CM | POA: Diagnosis not present

## 2021-02-11 DIAGNOSIS — E1169 Type 2 diabetes mellitus with other specified complication: Secondary | ICD-10-CM | POA: Diagnosis not present

## 2021-02-11 DIAGNOSIS — E1165 Type 2 diabetes mellitus with hyperglycemia: Secondary | ICD-10-CM | POA: Diagnosis not present

## 2021-02-11 DIAGNOSIS — M6281 Muscle weakness (generalized): Secondary | ICD-10-CM | POA: Diagnosis not present

## 2021-02-11 DIAGNOSIS — I130 Hypertensive heart and chronic kidney disease with heart failure and stage 1 through stage 4 chronic kidney disease, or unspecified chronic kidney disease: Secondary | ICD-10-CM | POA: Diagnosis not present

## 2021-02-11 DIAGNOSIS — I5042 Chronic combined systolic (congestive) and diastolic (congestive) heart failure: Secondary | ICD-10-CM | POA: Diagnosis not present

## 2021-02-11 DIAGNOSIS — E785 Hyperlipidemia, unspecified: Secondary | ICD-10-CM | POA: Diagnosis not present

## 2021-02-11 DIAGNOSIS — N189 Chronic kidney disease, unspecified: Secondary | ICD-10-CM | POA: Diagnosis not present

## 2021-02-11 DIAGNOSIS — L89620 Pressure ulcer of left heel, unstageable: Secondary | ICD-10-CM | POA: Diagnosis not present

## 2021-02-11 DIAGNOSIS — M86172 Other acute osteomyelitis, left ankle and foot: Secondary | ICD-10-CM | POA: Diagnosis not present

## 2021-02-11 DIAGNOSIS — M1990 Unspecified osteoarthritis, unspecified site: Secondary | ICD-10-CM | POA: Diagnosis not present

## 2021-02-11 DIAGNOSIS — M810 Age-related osteoporosis without current pathological fracture: Secondary | ICD-10-CM | POA: Diagnosis not present

## 2021-02-11 DIAGNOSIS — F419 Anxiety disorder, unspecified: Secondary | ICD-10-CM

## 2021-02-11 DIAGNOSIS — I2510 Atherosclerotic heart disease of native coronary artery without angina pectoris: Secondary | ICD-10-CM | POA: Diagnosis not present

## 2021-02-12 DIAGNOSIS — Z8616 Personal history of COVID-19: Secondary | ICD-10-CM | POA: Diagnosis not present

## 2021-02-12 DIAGNOSIS — E785 Hyperlipidemia, unspecified: Secondary | ICD-10-CM | POA: Diagnosis not present

## 2021-02-12 DIAGNOSIS — E1165 Type 2 diabetes mellitus with hyperglycemia: Secondary | ICD-10-CM | POA: Diagnosis not present

## 2021-02-12 DIAGNOSIS — N189 Chronic kidney disease, unspecified: Secondary | ICD-10-CM | POA: Diagnosis not present

## 2021-02-12 DIAGNOSIS — I130 Hypertensive heart and chronic kidney disease with heart failure and stage 1 through stage 4 chronic kidney disease, or unspecified chronic kidney disease: Secondary | ICD-10-CM | POA: Diagnosis not present

## 2021-02-12 DIAGNOSIS — E1140 Type 2 diabetes mellitus with diabetic neuropathy, unspecified: Secondary | ICD-10-CM | POA: Diagnosis not present

## 2021-02-12 DIAGNOSIS — I5042 Chronic combined systolic (congestive) and diastolic (congestive) heart failure: Secondary | ICD-10-CM | POA: Diagnosis not present

## 2021-02-12 DIAGNOSIS — Z87440 Personal history of urinary (tract) infections: Secondary | ICD-10-CM | POA: Diagnosis not present

## 2021-02-12 DIAGNOSIS — L89620 Pressure ulcer of left heel, unstageable: Secondary | ICD-10-CM | POA: Diagnosis not present

## 2021-02-12 DIAGNOSIS — K589 Irritable bowel syndrome without diarrhea: Secondary | ICD-10-CM | POA: Diagnosis not present

## 2021-02-12 DIAGNOSIS — M1990 Unspecified osteoarthritis, unspecified site: Secondary | ICD-10-CM | POA: Diagnosis not present

## 2021-02-12 DIAGNOSIS — E1122 Type 2 diabetes mellitus with diabetic chronic kidney disease: Secondary | ICD-10-CM | POA: Diagnosis not present

## 2021-02-12 DIAGNOSIS — M810 Age-related osteoporosis without current pathological fracture: Secondary | ICD-10-CM | POA: Diagnosis not present

## 2021-02-12 DIAGNOSIS — J449 Chronic obstructive pulmonary disease, unspecified: Secondary | ICD-10-CM | POA: Diagnosis not present

## 2021-02-12 DIAGNOSIS — M6281 Muscle weakness (generalized): Secondary | ICD-10-CM | POA: Diagnosis not present

## 2021-02-12 DIAGNOSIS — M86172 Other acute osteomyelitis, left ankle and foot: Secondary | ICD-10-CM | POA: Diagnosis not present

## 2021-02-12 DIAGNOSIS — E1169 Type 2 diabetes mellitus with other specified complication: Secondary | ICD-10-CM | POA: Diagnosis not present

## 2021-02-12 DIAGNOSIS — G4700 Insomnia, unspecified: Secondary | ICD-10-CM | POA: Diagnosis not present

## 2021-02-12 DIAGNOSIS — K219 Gastro-esophageal reflux disease without esophagitis: Secondary | ICD-10-CM | POA: Diagnosis not present

## 2021-02-12 DIAGNOSIS — I2510 Atherosclerotic heart disease of native coronary artery without angina pectoris: Secondary | ICD-10-CM | POA: Diagnosis not present

## 2021-02-12 DIAGNOSIS — E11319 Type 2 diabetes mellitus with unspecified diabetic retinopathy without macular edema: Secondary | ICD-10-CM | POA: Diagnosis not present

## 2021-02-12 DIAGNOSIS — N3281 Overactive bladder: Secondary | ICD-10-CM | POA: Diagnosis not present

## 2021-02-13 NOTE — Telephone Encounter (Signed)
Patients daughter  Circe called stating she is not driving an hour to here to sign the paperwork for FMLA. Viviane wanted to know if her sister could pick up and give to her to sign or you can send them to her somehow for her to sign. I explained to her that Delydia is out of the office today and she would need to call on Monday to talk to [facility] because I wasn't sure about the [facility] paperwork.

## 2021-02-17 ENCOUNTER — Telehealth: Payer: Self-pay | Admitting: Infectious Diseases

## 2021-02-17 NOTE — Telephone Encounter (Signed)
Daughter called wanting to know if pt needed more labs after finishing Antibiotics over the weekend

## 2021-02-18 DIAGNOSIS — E1140 Type 2 diabetes mellitus with diabetic neuropathy, unspecified: Secondary | ICD-10-CM | POA: Diagnosis not present

## 2021-02-18 DIAGNOSIS — Z87440 Personal history of urinary (tract) infections: Secondary | ICD-10-CM | POA: Diagnosis not present

## 2021-02-18 DIAGNOSIS — N189 Chronic kidney disease, unspecified: Secondary | ICD-10-CM | POA: Diagnosis not present

## 2021-02-18 DIAGNOSIS — N3281 Overactive bladder: Secondary | ICD-10-CM | POA: Diagnosis not present

## 2021-02-18 DIAGNOSIS — M1990 Unspecified osteoarthritis, unspecified site: Secondary | ICD-10-CM | POA: Diagnosis not present

## 2021-02-18 DIAGNOSIS — G4700 Insomnia, unspecified: Secondary | ICD-10-CM | POA: Diagnosis not present

## 2021-02-18 DIAGNOSIS — J449 Chronic obstructive pulmonary disease, unspecified: Secondary | ICD-10-CM | POA: Diagnosis not present

## 2021-02-18 DIAGNOSIS — E785 Hyperlipidemia, unspecified: Secondary | ICD-10-CM | POA: Diagnosis not present

## 2021-02-18 DIAGNOSIS — M86172 Other acute osteomyelitis, left ankle and foot: Secondary | ICD-10-CM | POA: Diagnosis not present

## 2021-02-18 DIAGNOSIS — I2510 Atherosclerotic heart disease of native coronary artery without angina pectoris: Secondary | ICD-10-CM | POA: Diagnosis not present

## 2021-02-18 DIAGNOSIS — E1165 Type 2 diabetes mellitus with hyperglycemia: Secondary | ICD-10-CM | POA: Diagnosis not present

## 2021-02-18 DIAGNOSIS — L89620 Pressure ulcer of left heel, unstageable: Secondary | ICD-10-CM | POA: Diagnosis not present

## 2021-02-18 DIAGNOSIS — I5042 Chronic combined systolic (congestive) and diastolic (congestive) heart failure: Secondary | ICD-10-CM | POA: Diagnosis not present

## 2021-02-18 DIAGNOSIS — M6281 Muscle weakness (generalized): Secondary | ICD-10-CM | POA: Diagnosis not present

## 2021-02-18 DIAGNOSIS — K589 Irritable bowel syndrome without diarrhea: Secondary | ICD-10-CM | POA: Diagnosis not present

## 2021-02-18 DIAGNOSIS — K219 Gastro-esophageal reflux disease without esophagitis: Secondary | ICD-10-CM | POA: Diagnosis not present

## 2021-02-18 DIAGNOSIS — Z8616 Personal history of COVID-19: Secondary | ICD-10-CM | POA: Diagnosis not present

## 2021-02-18 DIAGNOSIS — E1122 Type 2 diabetes mellitus with diabetic chronic kidney disease: Secondary | ICD-10-CM | POA: Diagnosis not present

## 2021-02-18 DIAGNOSIS — E11319 Type 2 diabetes mellitus with unspecified diabetic retinopathy without macular edema: Secondary | ICD-10-CM | POA: Diagnosis not present

## 2021-02-18 DIAGNOSIS — M810 Age-related osteoporosis without current pathological fracture: Secondary | ICD-10-CM | POA: Diagnosis not present

## 2021-02-18 DIAGNOSIS — I130 Hypertensive heart and chronic kidney disease with heart failure and stage 1 through stage 4 chronic kidney disease, or unspecified chronic kidney disease: Secondary | ICD-10-CM | POA: Diagnosis not present

## 2021-02-18 DIAGNOSIS — E1169 Type 2 diabetes mellitus with other specified complication: Secondary | ICD-10-CM | POA: Diagnosis not present

## 2021-02-19 ENCOUNTER — Ambulatory Visit: Payer: Self-pay | Admitting: Critical Care Medicine

## 2021-02-19 ENCOUNTER — Ambulatory Visit: Payer: Self-pay | Admitting: *Deleted

## 2021-02-19 DIAGNOSIS — E785 Hyperlipidemia, unspecified: Secondary | ICD-10-CM | POA: Diagnosis not present

## 2021-02-19 DIAGNOSIS — Z8616 Personal history of COVID-19: Secondary | ICD-10-CM | POA: Diagnosis not present

## 2021-02-19 DIAGNOSIS — E1140 Type 2 diabetes mellitus with diabetic neuropathy, unspecified: Secondary | ICD-10-CM | POA: Diagnosis not present

## 2021-02-19 DIAGNOSIS — E11319 Type 2 diabetes mellitus with unspecified diabetic retinopathy without macular edema: Secondary | ICD-10-CM | POA: Diagnosis not present

## 2021-02-19 DIAGNOSIS — I5042 Chronic combined systolic (congestive) and diastolic (congestive) heart failure: Secondary | ICD-10-CM | POA: Diagnosis not present

## 2021-02-19 DIAGNOSIS — N3281 Overactive bladder: Secondary | ICD-10-CM | POA: Diagnosis not present

## 2021-02-19 DIAGNOSIS — J449 Chronic obstructive pulmonary disease, unspecified: Secondary | ICD-10-CM | POA: Diagnosis not present

## 2021-02-19 DIAGNOSIS — I2510 Atherosclerotic heart disease of native coronary artery without angina pectoris: Secondary | ICD-10-CM | POA: Diagnosis not present

## 2021-02-19 DIAGNOSIS — Z87440 Personal history of urinary (tract) infections: Secondary | ICD-10-CM | POA: Diagnosis not present

## 2021-02-19 DIAGNOSIS — N189 Chronic kidney disease, unspecified: Secondary | ICD-10-CM | POA: Diagnosis not present

## 2021-02-19 DIAGNOSIS — E1165 Type 2 diabetes mellitus with hyperglycemia: Secondary | ICD-10-CM | POA: Diagnosis not present

## 2021-02-19 DIAGNOSIS — I130 Hypertensive heart and chronic kidney disease with heart failure and stage 1 through stage 4 chronic kidney disease, or unspecified chronic kidney disease: Secondary | ICD-10-CM | POA: Diagnosis not present

## 2021-02-19 DIAGNOSIS — M810 Age-related osteoporosis without current pathological fracture: Secondary | ICD-10-CM | POA: Diagnosis not present

## 2021-02-19 DIAGNOSIS — K589 Irritable bowel syndrome without diarrhea: Secondary | ICD-10-CM | POA: Diagnosis not present

## 2021-02-19 DIAGNOSIS — M6281 Muscle weakness (generalized): Secondary | ICD-10-CM | POA: Diagnosis not present

## 2021-02-19 DIAGNOSIS — M1990 Unspecified osteoarthritis, unspecified site: Secondary | ICD-10-CM | POA: Diagnosis not present

## 2021-02-19 DIAGNOSIS — E1122 Type 2 diabetes mellitus with diabetic chronic kidney disease: Secondary | ICD-10-CM | POA: Diagnosis not present

## 2021-02-19 DIAGNOSIS — L89620 Pressure ulcer of left heel, unstageable: Secondary | ICD-10-CM | POA: Diagnosis not present

## 2021-02-19 DIAGNOSIS — E1169 Type 2 diabetes mellitus with other specified complication: Secondary | ICD-10-CM | POA: Diagnosis not present

## 2021-02-19 DIAGNOSIS — K219 Gastro-esophageal reflux disease without esophagitis: Secondary | ICD-10-CM | POA: Diagnosis not present

## 2021-02-19 DIAGNOSIS — G4700 Insomnia, unspecified: Secondary | ICD-10-CM | POA: Diagnosis not present

## 2021-02-19 DIAGNOSIS — M86172 Other acute osteomyelitis, left ankle and foot: Secondary | ICD-10-CM | POA: Diagnosis not present

## 2021-02-19 NOTE — Telephone Encounter (Signed)
Left VM for Hoyle Sauer' to call back for clarification of RX for insulin.

## 2021-02-19 NOTE — Telephone Encounter (Signed)
Home Health Physical Therapist called re pt hypoglycemic in mornings and taking glucose pills.  Reason for Disposition  Caller has medicine question only, adult not sick, AND triager answers question  Answer Assessment - Initial Assessment Questions 1. NAME of MEDICATION: "What medicine are you calling about?"     Lantus Pen 2. QUESTION: "What is your question?" (e.g., double dose of medicine, side effect)     dose 3. PRESCRIBING HCP: "Who prescribed it?" Reason: if prescribed by specialist, call should be referred to that group.     Dr. Joya Gaskins 4. SYMPTOMS: "Do you have any symptoms?"     Hypoglycemia, taking glucose pills 5. SEVERITY: If symptoms are present, ask "Are they mild, moderate or severe?" Moderate, able to take glucose pill and recover 6. PREGNANCY:  "Is there any chance that you are pregnant?" "When was your last menstrual period?"     no  Protocols used: Medication Question Call-A-AH Unable to reach pt, NOTE her [facility] PT had called the triage line and she is who I am able to reach by phone. States pt hypoglycemic, having to take glucose pills, taking 40 units Lantus pen at HS. Medication list states 14 units is ordered. I was unable to reach pt but the [facility] PT states she will be able to reach pt or pt's daughter immediately before she takes tonight's dose.

## 2021-02-20 NOTE — Telephone Encounter (Signed)
PEC has attempted 3 times to contact her without success.  "Andrea [name]" needing clarification of Rx for insulin.

## 2021-02-20 NOTE — Telephone Encounter (Signed)
Lmtcb, patient currently on lantus 14 units qhs and I see trulicity 1.5mg  once a week. KW

## 2021-02-23 NOTE — Progress Notes (Signed)
Established patient visit   Patient: Andrea [name]   DOB: 02-19-1946   75 y.o. Female  MRN: [id number] Visit Date: 02/24/2021  Today's healthcare provider: [name], FNP   No chief complaint on file.  Subjective    HPI  Diabetes Mellitus Type II, Follow-up  Lab Results  Component Value Date   HGBA1C 7.5 (A) 02/24/2021   HGBA1C 7.5 (H) 11/17/2020   HGBA1C 7.3 (A) 05/15/2020   Wt Readings from Last 3 Encounters:  02/24/21 135 lb (61.2 kg)  01/27/21 121 lb 1.6 oz (54.9 kg)  01/23/21 121 lb 1.6 oz (54.9 kg)   Last seen for diabetes 3 months ago.    Management since then includes none.   She reports excellent compliance with treatment. She is not having side effects.  Symptoms: No fatigue No foot ulcerations  No appetite changes No nausea  No paresthesia of the feet  No polydipsia  No polyuria No visual disturbances   No vomiting     Home blood sugar records: fasting range: 70's  Episodes of hypoglycemia? Yes Occasionally     Pertinent Labs: Lab Results  Component Value Date   CHOL 115 02/11/2020   HDL 67 02/11/2020   LDLCALC 29 02/11/2020   TRIG 105 02/11/2020   CHOLHDL 1.7 02/11/2020   Lab Results  Component Value Date   NA 142 12/22/2020   K 3.7 12/22/2020   CREATININE 1.22 (H) 12/22/2020   GFRNONAA 46 (L) 12/22/2020   MICROALBUR 20 12/16/2016     ---------------------------------------------------------------------------------------------------     Medications: Outpatient Medications Prior to Visit  Medication Sig   acetaminophen (TYLENOL) 325 MG tablet Take 2 tablets (650 mg total) by mouth every 6 (six) hours as needed for mild pain (or Fever >/= 101).   Alcohol Swabs (ALCOHOL PREP) PADS Use twice daily prior to SQ injection of insulin to clean skin   amoxicillin-clavulanate (AUGMENTIN) 875-125 MG tablet Take 1 tablet by mouth 2 (two) times daily.   atorvastatin (LIPITOR) 20 MG tablet Take 1 tablet (20 mg total) by mouth daily.  (Patient taking differently: Take 20 mg by mouth at bedtime.)   Calcium Carbonate-Vitamin D 600-400 MG-UNIT tablet Take 1 tablet by mouth 2 (two) times daily.   Cyanocobalamin (B-12) 1000 MCG CAPS Take 1,000 mcg by mouth daily.    hydrOXYzine (ATARAX/VISTARIL) 25 MG tablet Take 1 tablet (25 mg total) by mouth 3 (three) times daily as needed.   Insulin Pen Needle (TRUEPLUS PEN NEEDLES) 31G X 6 MM MISC Use with lantus two times daily.   INSULIN SYRINGE 1CC/29G 29G X 1/2" 1 ML MISC For lantus injections twice daily   metoprolol succinate (TOPROL-XL) 25 MG 24 hr tablet TAKE 1 TABLET BY MOUTH EVERY DAY   Multiple Vitamins-Minerals (PRESERVISION AREDS 2 PO) Take 1 tablet by mouth in the morning and at bedtime.   Omega-3 1000 MG CAPS Take 1,000 mg by mouth daily.   omeprazole (PRILOSEC) 40 MG capsule TAKE 1 CAPSULE BY MOUTH EVERY DAY   OneTouch Delica Lancets 73S MISC To check blood sugar daily  DX: E11.9   ONETOUCH VERIO test strip TO CHECK BLOOD SUGAR ONCE DAILY.   oxyCODONE (OXY IR/ROXICODONE) 5 MG immediate release tablet Take 1 tablet (5 mg total) by mouth every 6 (six) hours as needed for severe pain or moderate pain.   potassium chloride (KLOR-CON) 10 MEQ tablet Take 10 mEq by mouth daily.   sertraline (ZOLOFT) 25 MG tablet Take 1 tablet (  25 mg total) by mouth daily.   traZODone (DESYREL) 100 MG tablet Take 0.5 tablets (50 mg total) by mouth at bedtime.   TRULICITY 1.5 FI/4.3PI SOPN INJECT 1.5MG  INTO THE SKIN ONCE A WEEK AS DIRECTED (Patient taking differently: On friday)   vitamin C (ASCORBIC ACID) 500 MG tablet Take 500 mg by mouth 2 (two) times daily.   Vitamin D, Ergocalciferol, (DRISDOL) 1.25 MG (50000 UNIT) CAPS capsule TAKE ONE CAPSULE BY MOUTH ONCE WEEKLY ON THE SAME DAY EACH WEEK (ON FRIDAYS) (Patient taking differently: Take 50,000 Units by mouth every 7 (seven) days.)   zinc gluconate 50 MG tablet Take 50 mg by mouth daily.   [DISCONTINUED] LANTUS SOLOSTAR 100 UNIT/ML Solostar Pen  Inject 14 Units into the skin at bedtime.   [DISCONTINUED] doxycycline (VIBRA-TABS) 100 MG tablet Take 1 tablet (100 mg total) by mouth 2 (two) times daily. (Patient not taking: Reported on 02/24/2021)   No facility-administered medications prior to visit.    Review of Systems     Objective    BP (!) 131/59 (BP Location: Left Arm, Patient Position: Sitting, Cuff Size: Large)    Pulse 96    Temp 97.9 F (36.6 C) (Oral)    Wt 135 lb (61.2 kg)    SpO2 100%    BMI 26.37 kg/m    Physical Exam Vitals and nursing note reviewed.  Constitutional:      General: She is not in acute distress.    Appearance: Normal appearance. She is overweight. She is not ill-appearing, toxic-appearing or diaphoretic.  HENT:     Head: Normocephalic and atraumatic.  Cardiovascular:     Rate and Rhythm: Normal rate and regular rhythm.     Pulses: Normal pulses.     Heart sounds: Normal heart sounds. No murmur heard.   No friction rub. No gallop.  Pulmonary:     Effort: Pulmonary effort is normal. No respiratory distress.     Breath sounds: Normal breath sounds. No stridor. No wheezing, rhonchi or rales.  Chest:     Chest wall: No tenderness.  Abdominal:     General: Bowel sounds are normal.     Palpations: Abdomen is soft.  Musculoskeletal:        General: No swelling, tenderness, deformity or signs of injury. Normal range of motion.     Right lower leg: No edema.     Left lower leg: No edema.  Skin:    General: Skin is warm and dry.     Capillary Refill: Capillary refill takes less than 2 seconds.     Coloration: Skin is not jaundiced or pale.     Findings: No bruising, erythema, lesion or rash.  Neurological:     General: No focal deficit present.     Mental Status: She is alert and oriented to person, place, and time. Mental status is at baseline.     Cranial Nerves: No cranial nerve deficit.     Sensory: No sensory deficit.     Motor: No weakness.     Coordination: Coordination normal.   Psychiatric:        Mood and Affect: Mood normal.        Behavior: Behavior normal.        Thought Content: Thought content normal.        Judgment: Judgment normal.     Results for orders placed or performed in visit on 02/24/21  POCT glycosylated hemoglobin (Hb A1C)  Result Value Ref Range   Hemoglobin  A1C 7.5 (A) 4.0 - 5.6 %    Assessment & Plan     Problem List Items Addressed This Visit       Other   Non healing left heel wound    Was seen at podiatry today; wound continues to have poor healing Increase of lantus to assist given increase in weight and stable A1c      Loss of appetite for more than 2 weeks    Weight has stabilized doing well with supplements, slight improvement in appetite       MRSA (methicillin resistant staph aureus) culture positive    Repeat swab done today at podiatry; anticipated new Abx Pt encouraged to drink more water Samples of protein shakes provided to take if she is skipping meals      Other Visit Diagnoses     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 edema, with long-term current use of insulin (HCC)    -  Primary   Relevant Medications   LANTUS SOLOSTAR 100 UNIT/ML Solostar Pen   Other Relevant Orders   POCT glycosylated hemoglobin (Hb A1C) (Completed)        Return in about 3 months (around 05/25/2021) for chonic disease management.      Vonna Kotyk, FNP, have reviewed all documentation for this visit. The documentation on 02/24/21 for the exam, diagnosis, procedures, and orders are all accurate and complete.    [name], [place] [phone] (phone) [phone] (fax)  [place]

## 2021-02-24 ENCOUNTER — Encounter: Payer: Self-pay | Admitting: Family Medicine

## 2021-02-24 ENCOUNTER — Ambulatory Visit (INDEPENDENT_AMBULATORY_CARE_PROVIDER_SITE_OTHER): Payer: Medicare Other | Admitting: Podiatry

## 2021-02-24 ENCOUNTER — Ambulatory Visit (INDEPENDENT_AMBULATORY_CARE_PROVIDER_SITE_OTHER): Payer: Medicare Other | Admitting: Family Medicine

## 2021-02-24 ENCOUNTER — Other Ambulatory Visit: Payer: Self-pay

## 2021-02-24 VITALS — BP 131/59 | HR 96 | Temp 97.9°F | Wt 135.0 lb

## 2021-02-24 DIAGNOSIS — Z22322 Carrier or suspected carrier of Methicillin resistant Staphylococcus aureus: Secondary | ICD-10-CM

## 2021-02-24 DIAGNOSIS — L97422 Non-pressure chronic ulcer of left heel and midfoot with fat layer exposed: Secondary | ICD-10-CM

## 2021-02-24 DIAGNOSIS — E0843 Diabetes mellitus due to underlying condition with diabetic autonomic (poly)neuropathy: Secondary | ICD-10-CM

## 2021-02-24 DIAGNOSIS — Z794 Long term (current) use of insulin: Secondary | ICD-10-CM

## 2021-02-24 DIAGNOSIS — S91302A Unspecified open wound, left foot, initial encounter: Secondary | ICD-10-CM | POA: Diagnosis not present

## 2021-02-24 DIAGNOSIS — E113293 Type 2 diabetes mellitus with mild nonproliferative diabetic retinopathy without macular edema, bilateral: Secondary | ICD-10-CM | POA: Diagnosis not present

## 2021-02-24 DIAGNOSIS — R630 Anorexia: Secondary | ICD-10-CM

## 2021-02-24 LAB — POCT GLYCOSYLATED HEMOGLOBIN (HGB A1C): Hemoglobin A1C: 7.5 % — AB (ref 4.0–5.6)

## 2021-02-24 MED ORDER — LANTUS SOLOSTAR 100 UNIT/ML ~~LOC~~ SOPN: 20.0000 [IU] | PEN_INJECTOR | Freq: Every day | SUBCUTANEOUS | 0 refills | Status: DC

## 2021-02-24 NOTE — Progress Notes (Signed)
Subjective:  Patient presents today status post partial posterior calcanectomy with placement of antibiotic beads and application of wound graft left.  DOS: 11/20/2020.  Patient doing well.  She continues to apply the collagen dressing that was provided here in the office.  She is concomitantly being managed with infectious disease for IV antibiotics.  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   Depression    Diabetes mellitus type 2, uncomplicated ([place])    Diabetic retinopathy ([place])    Dupuytren contracture 2011   s/p surgery to LEFT hand   Frequent urinary tract infections    GERD (gastroesophageal reflux disease)    Grade I diastolic dysfunction    HTN (hypertension)    Hyperlipidemia, unspecified    Irritable bowel syndrome    Meningitis 11/2019   MI (myocardial infarction) ([place]) 1994   no stent   MRSA (methicillin resistant staph aureus) culture positive    Neuromuscular disorder ([place])    nerve pain in back and legs   Osteoporosis    Pneumonia 2021   RBBB    Sepsis ([place]) 11/2019   Skin cancer    TIA (transient ischemic attack) 2014   Vitamin D deficiency, unspecified    Wheezing    Past Surgical History:  Procedure Laterality Date   BACK SURGERY     lower due to spinal stenosis   BONE BIOPSY Left 11/20/2020   Procedure: BONE BIOPSY;  Surgeon: Edrick Kins, DPM;  Location: [facility] ORS;  Service: Podiatry;  Laterality: Left;   CARDIAC CATHETERIZATION  1994   CATARACT EXTRACTION, BILATERAL Bilateral    COLONOSCOPY     x3   COLONOSCOPY WITH PROPOFOL N/A 02/08/2019   Procedure: COLONOSCOPY WITH PROPOFOL;  Surgeon: [name], MD;  Location: [facility] ENDOSCOPY;  Service: Gastroenterology;  Laterality:  N/A;   CORONARY ANGIOPLASTY  1994   DUPUYTREN CONTRACTURE RELEASE Left 2011   HARDWARE REMOVAL Left 08/19/2020   Procedure: HARDWARE REMOVAL;  Surgeon: Hessie Knows, MD;  Location: [facility] ORS;  Service: Orthopedics;  Laterality: Left;   HIP FRACTURE SURGERY  11/2013   IRRIGATION AND DEBRIDEMENT FOOT Left 11/20/2020   Procedure: IRRIGATION AND DEBRIDEMENT FOOT-Heel Ulcer;  Surgeon: Edrick Kins, DPM;  Location: [facility] ORS;  Service: Podiatry;  Laterality: Left;   PR COLSC FLX W/REMOVAL LESION BY HOT BX FORCEPS   08/21/2015   Procedure: COLONOSCOPY, FLEXIBLE, PROXIMAL TO SPLENIC FLEXURE; W/REMOVAL TUMOR/POLYP/OTHER LESION, HOT BX FORCEP/CAUTE; Surgeon: Carlena Hurl, MD; Location: [facility]; Service: General Surgery   REPAIR DURAL / CSF LEAK     to left sinus   TEE WITHOUT CARDIOVERSION N/A 11/20/2019   Procedure: TRANSESOPHAGEAL ECHOCARDIOGRAM (TEE);  Surgeon: Nelva Bush, MD;  Location: [facility] ORS;  Service: Cardiovascular;  Laterality: N/A;   TOTAL HIP ARTHROPLASTY Left 08/19/2020   Procedure: TOTAL HIP ARTHROPLASTY ANTERIOR APPROACH;  Surgeon: Hessie Knows, MD;  Location: [facility] ORS;  Service: Orthopedics;  Laterality: Left;   Allergies  Allergen Reactions   Chlorhexidine    Metformin And Related Diarrhea   Sulfa Antibiotics Rash      Objective/Physical Exam Neurovascular status intact.  No edema noted.  No erythema.  There continues to be slight serous drainage to the posterior heel wound which measures approximately 1.5 x 0.8 x 0.3 cm.  There is improvement since last month.  Wound base is mostly fibrotic however.  Periwound is intact.  Assessment: 1. s/p partial calcanectomy with placement of antibiotic beads and application of wound VAC left.. DOS: 11/20/2020 2.  Ulcer left posterior heel secondary to diabetes mellitus 3. H/O osteomyelitis LT calcaneus   Plan of Care:  1. Patient was evaluated. 2.  Medically necessary excisional debridement including subcutaneous  tissue and deep fascial tissue was performed using a tissue nipper.  Excisional debridement of all necrotic nonviable tissue down to healthier bleeding viable tissue was performed with postdebridement measurement same as pre- 3.  Continue collagen Prisma and dry sterile dressing daily 4.  Continue weightbearing in postsurgical shoe 5.  New cultures were taken today and sent to pathology for culture and sensitivity.  Continue close follow-up with infectious disease.  Last cultures taken 01/27/2021 positive for MRSA and E. coli.  Placed on Augmentin + Doxy x 2 weeks by ID.  6.  Return to clinic in 4 weeks for follow-up and follow-up x-ray   Edrick Kins, DPM [facility]  Dr. Edrick Kins, DPM    [address], [place] [address]                Office [phone]  Fax [phone]

## 2021-02-24 NOTE — Assessment & Plan Note (Signed)
Weight has stabilized doing well with supplements, slight improvement in appetite

## 2021-02-24 NOTE — Assessment & Plan Note (Signed)
Repeat swab done today at podiatry; anticipated new Abx Pt encouraged to drink more water Samples of protein shakes provided to take if she is skipping meals

## 2021-02-24 NOTE — Assessment & Plan Note (Signed)
Was seen at podiatry today; wound continues to have poor healing Increase of lantus to assist given increase in weight and stable A1c

## 2021-02-25 ENCOUNTER — Telehealth: Payer: Self-pay | Admitting: *Deleted

## 2021-02-25 DIAGNOSIS — Z87440 Personal history of urinary (tract) infections: Secondary | ICD-10-CM | POA: Diagnosis not present

## 2021-02-25 DIAGNOSIS — E1169 Type 2 diabetes mellitus with other specified complication: Secondary | ICD-10-CM | POA: Diagnosis not present

## 2021-02-25 DIAGNOSIS — E1140 Type 2 diabetes mellitus with diabetic neuropathy, unspecified: Secondary | ICD-10-CM | POA: Diagnosis not present

## 2021-02-25 DIAGNOSIS — M86172 Other acute osteomyelitis, left ankle and foot: Secondary | ICD-10-CM | POA: Diagnosis not present

## 2021-02-25 DIAGNOSIS — I5042 Chronic combined systolic (congestive) and diastolic (congestive) heart failure: Secondary | ICD-10-CM | POA: Diagnosis not present

## 2021-02-25 DIAGNOSIS — Z8616 Personal history of COVID-19: Secondary | ICD-10-CM | POA: Diagnosis not present

## 2021-02-25 DIAGNOSIS — I130 Hypertensive heart and chronic kidney disease with heart failure and stage 1 through stage 4 chronic kidney disease, or unspecified chronic kidney disease: Secondary | ICD-10-CM | POA: Diagnosis not present

## 2021-02-25 DIAGNOSIS — I2510 Atherosclerotic heart disease of native coronary artery without angina pectoris: Secondary | ICD-10-CM | POA: Diagnosis not present

## 2021-02-25 DIAGNOSIS — E11319 Type 2 diabetes mellitus with unspecified diabetic retinopathy without macular edema: Secondary | ICD-10-CM | POA: Diagnosis not present

## 2021-02-25 DIAGNOSIS — J449 Chronic obstructive pulmonary disease, unspecified: Secondary | ICD-10-CM | POA: Diagnosis not present

## 2021-02-25 DIAGNOSIS — L89620 Pressure ulcer of left heel, unstageable: Secondary | ICD-10-CM | POA: Diagnosis not present

## 2021-02-25 DIAGNOSIS — M1990 Unspecified osteoarthritis, unspecified site: Secondary | ICD-10-CM | POA: Diagnosis not present

## 2021-02-25 DIAGNOSIS — M6281 Muscle weakness (generalized): Secondary | ICD-10-CM | POA: Diagnosis not present

## 2021-02-25 DIAGNOSIS — M810 Age-related osteoporosis without current pathological fracture: Secondary | ICD-10-CM | POA: Diagnosis not present

## 2021-02-25 DIAGNOSIS — E1122 Type 2 diabetes mellitus with diabetic chronic kidney disease: Secondary | ICD-10-CM | POA: Diagnosis not present

## 2021-02-25 DIAGNOSIS — N3281 Overactive bladder: Secondary | ICD-10-CM | POA: Diagnosis not present

## 2021-02-25 DIAGNOSIS — K589 Irritable bowel syndrome without diarrhea: Secondary | ICD-10-CM | POA: Diagnosis not present

## 2021-02-25 DIAGNOSIS — E785 Hyperlipidemia, unspecified: Secondary | ICD-10-CM | POA: Diagnosis not present

## 2021-02-25 DIAGNOSIS — G4700 Insomnia, unspecified: Secondary | ICD-10-CM | POA: Diagnosis not present

## 2021-02-25 DIAGNOSIS — N189 Chronic kidney disease, unspecified: Secondary | ICD-10-CM | POA: Diagnosis not present

## 2021-02-25 DIAGNOSIS — K219 Gastro-esophageal reflux disease without esophagitis: Secondary | ICD-10-CM | POA: Diagnosis not present

## 2021-02-25 DIAGNOSIS — E1165 Type 2 diabetes mellitus with hyperglycemia: Secondary | ICD-10-CM | POA: Diagnosis not present

## 2021-02-25 NOTE — Telephone Encounter (Signed)
"  My mother was there yesterday.  Dr. Amalia Hailey said he was going to send in a prescription to her pharmacy for an antibiotic.  The pharmacist said it hasn't been called in.  I didn't know if Dr. Amalia Hailey was waiting on results of the culture before he called it in or what.  Can you find out for me?"  I will send Dr. Amalia Hailey a message.  What pharmacy does she use?  "She uses [facility] in [place]."

## 2021-02-25 NOTE — Telephone Encounter (Signed)
Not sending anything in. I didn't realize at the time that infectious disease had already sent in antibiotics for the MRSA infection she had. - dr. Amalia Hailey

## 2021-02-26 ENCOUNTER — Other Ambulatory Visit: Payer: Self-pay | Admitting: Family Medicine

## 2021-02-26 DIAGNOSIS — Z794 Long term (current) use of insulin: Secondary | ICD-10-CM

## 2021-02-26 NOTE — Telephone Encounter (Signed)
Requested Prescriptions  Pending Prescriptions Disp Refills   TRULICITY 1.5 AQ/7.6AU SOPN [Pharmacy Med Name: TRULICITY 1.5 QJ/3.3 ML PEN] 2 mL 2    Sig: INJECT 1.5MG  INTO THE SKIN ONCE A WEEK AS DIRECTED     Endocrinology:  Diabetes - GLP-1 Receptor Agonists Passed - 02/26/2021  1:42 AM      Passed - HBA1C is between 0 and 7.9 and within 180 days    Hemoglobin A1C  Date Value Ref Range Status  02/24/2021 7.5 (A) 4.0 - 5.6 % Final    Comment:    Average 169  11/26/2013 7.5 (H) 4.2 - 6.3 % Final    Comment:    The American Diabetes Association recommends that a primary goal of therapy should be <7% and that physicians should reevaluate the treatment regimen in patients with HbA1c values consistently >8%.    Hgb A1c MFr Bld  Date Value Ref Range Status  11/17/2020 7.5 (H) 4.8 - 5.6 % Final    Comment:    (NOTE) Pre diabetes:          5.7%-6.4%  Diabetes:              >6.4%  Glycemic control for   <7.0% adults with diabetes          Passed - Valid encounter within last 6 months    Recent Outpatient Visits          2 days ago 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 edema, with long-term current use of insulin [facility])   [facility] [name] T, FNP   1 month ago Hospital discharge follow-up   [facility] [name] T, FNP   3 months ago 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 edema, with long-term current use of insulin [facility])   [facility] [name] T, FNP   8 months ago Localized swelling of left lower extremity   [facility] [name] M, PA-C   9 months ago 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 edema, with long-term current use of insulin [facility])   [place], Clearnce Sorrel, [place]      Future  Appointments            In 1 month Gollan, Kathlene November, MD [facility], [place]

## 2021-02-27 ENCOUNTER — Telehealth: Payer: Self-pay

## 2021-02-27 NOTE — Telephone Encounter (Signed)
°  Care Management   Follow Up Note   02/27/2021 Name: NAO LINZ MRN: [id number] DOB: [birth year]   Primary Care Provider: [name], FNP Reason for referral : Chronic Care Management   An unsuccessful telephone outreach was attempted today. The patient was referred to the case management team for assistance with care management and care coordination.    Follow Up Plan:  A HIPAA compliant voice message was left today requesting a return call.   [facility] Care Management [facility] [phone]

## 2021-03-02 ENCOUNTER — Other Ambulatory Visit: Payer: Self-pay | Admitting: Family Medicine

## 2021-03-02 DIAGNOSIS — N3281 Overactive bladder: Secondary | ICD-10-CM

## 2021-03-03 ENCOUNTER — Telehealth: Payer: Self-pay

## 2021-03-03 NOTE — Telephone Encounter (Signed)
VM left by daughter asking if we need to do another culture since patient finished antibiotics. Dr Delaine Lame has reached out to Dr Amalia Hailey who did repeat wound culture on 02/24/21 and it is in preliminary status. She advised Dr Amalia Hailey that patient does not have a scheduled follow up with [place] and if positive cx result, he may treat with oral abx. I have called patient and left detailed vm with this information as requested by daughter. I also called daughter and left vm that I will notify her mother of the plan which would be to follow up with Dr Amalia Hailey once he has results to the pending wound culture from 12/20. He will decide if more labs or medications are needed. If this changes I will call them both back.

## 2021-03-03 NOTE — Telephone Encounter (Signed)
Pts daughter, [name] called in regarding medication. Read message below to pt and she will follow up with infectious disease and contact us if our services are needed.

## 2021-03-04 DIAGNOSIS — J449 Chronic obstructive pulmonary disease, unspecified: Secondary | ICD-10-CM | POA: Diagnosis not present

## 2021-03-04 DIAGNOSIS — M810 Age-related osteoporosis without current pathological fracture: Secondary | ICD-10-CM | POA: Diagnosis not present

## 2021-03-04 DIAGNOSIS — Z87440 Personal history of urinary (tract) infections: Secondary | ICD-10-CM | POA: Diagnosis not present

## 2021-03-04 DIAGNOSIS — L89620 Pressure ulcer of left heel, unstageable: Secondary | ICD-10-CM | POA: Diagnosis not present

## 2021-03-04 DIAGNOSIS — M1990 Unspecified osteoarthritis, unspecified site: Secondary | ICD-10-CM | POA: Diagnosis not present

## 2021-03-04 DIAGNOSIS — G4700 Insomnia, unspecified: Secondary | ICD-10-CM | POA: Diagnosis not present

## 2021-03-04 DIAGNOSIS — K219 Gastro-esophageal reflux disease without esophagitis: Secondary | ICD-10-CM | POA: Diagnosis not present

## 2021-03-04 DIAGNOSIS — K589 Irritable bowel syndrome without diarrhea: Secondary | ICD-10-CM | POA: Diagnosis not present

## 2021-03-04 DIAGNOSIS — N189 Chronic kidney disease, unspecified: Secondary | ICD-10-CM | POA: Diagnosis not present

## 2021-03-04 DIAGNOSIS — E785 Hyperlipidemia, unspecified: Secondary | ICD-10-CM | POA: Diagnosis not present

## 2021-03-04 DIAGNOSIS — I2510 Atherosclerotic heart disease of native coronary artery without angina pectoris: Secondary | ICD-10-CM | POA: Diagnosis not present

## 2021-03-04 DIAGNOSIS — E1140 Type 2 diabetes mellitus with diabetic neuropathy, unspecified: Secondary | ICD-10-CM | POA: Diagnosis not present

## 2021-03-04 DIAGNOSIS — M6281 Muscle weakness (generalized): Secondary | ICD-10-CM | POA: Diagnosis not present

## 2021-03-04 DIAGNOSIS — E1122 Type 2 diabetes mellitus with diabetic chronic kidney disease: Secondary | ICD-10-CM | POA: Diagnosis not present

## 2021-03-04 DIAGNOSIS — E11319 Type 2 diabetes mellitus with unspecified diabetic retinopathy without macular edema: Secondary | ICD-10-CM | POA: Diagnosis not present

## 2021-03-04 DIAGNOSIS — E1169 Type 2 diabetes mellitus with other specified complication: Secondary | ICD-10-CM | POA: Diagnosis not present

## 2021-03-04 DIAGNOSIS — Z8616 Personal history of COVID-19: Secondary | ICD-10-CM | POA: Diagnosis not present

## 2021-03-04 DIAGNOSIS — I5042 Chronic combined systolic (congestive) and diastolic (congestive) heart failure: Secondary | ICD-10-CM | POA: Diagnosis not present

## 2021-03-04 DIAGNOSIS — I130 Hypertensive heart and chronic kidney disease with heart failure and stage 1 through stage 4 chronic kidney disease, or unspecified chronic kidney disease: Secondary | ICD-10-CM | POA: Diagnosis not present

## 2021-03-04 DIAGNOSIS — N3281 Overactive bladder: Secondary | ICD-10-CM | POA: Diagnosis not present

## 2021-03-04 DIAGNOSIS — E1165 Type 2 diabetes mellitus with hyperglycemia: Secondary | ICD-10-CM | POA: Diagnosis not present

## 2021-03-04 DIAGNOSIS — M86172 Other acute osteomyelitis, left ankle and foot: Secondary | ICD-10-CM | POA: Diagnosis not present

## 2021-03-05 LAB — WOUND CULTURE: Organism ID, Bacteria: NONE SEEN

## 2021-03-11 DIAGNOSIS — I2510 Atherosclerotic heart disease of native coronary artery without angina pectoris: Secondary | ICD-10-CM | POA: Diagnosis not present

## 2021-03-11 DIAGNOSIS — Z87440 Personal history of urinary (tract) infections: Secondary | ICD-10-CM | POA: Diagnosis not present

## 2021-03-11 DIAGNOSIS — M810 Age-related osteoporosis without current pathological fracture: Secondary | ICD-10-CM | POA: Diagnosis not present

## 2021-03-11 DIAGNOSIS — E1165 Type 2 diabetes mellitus with hyperglycemia: Secondary | ICD-10-CM | POA: Diagnosis not present

## 2021-03-11 DIAGNOSIS — N189 Chronic kidney disease, unspecified: Secondary | ICD-10-CM | POA: Diagnosis not present

## 2021-03-11 DIAGNOSIS — I130 Hypertensive heart and chronic kidney disease with heart failure and stage 1 through stage 4 chronic kidney disease, or unspecified chronic kidney disease: Secondary | ICD-10-CM | POA: Diagnosis not present

## 2021-03-11 DIAGNOSIS — M6281 Muscle weakness (generalized): Secondary | ICD-10-CM | POA: Diagnosis not present

## 2021-03-11 DIAGNOSIS — E11319 Type 2 diabetes mellitus with unspecified diabetic retinopathy without macular edema: Secondary | ICD-10-CM | POA: Diagnosis not present

## 2021-03-11 DIAGNOSIS — N3281 Overactive bladder: Secondary | ICD-10-CM | POA: Diagnosis not present

## 2021-03-11 DIAGNOSIS — K589 Irritable bowel syndrome without diarrhea: Secondary | ICD-10-CM | POA: Diagnosis not present

## 2021-03-11 DIAGNOSIS — K219 Gastro-esophageal reflux disease without esophagitis: Secondary | ICD-10-CM | POA: Diagnosis not present

## 2021-03-11 DIAGNOSIS — E1140 Type 2 diabetes mellitus with diabetic neuropathy, unspecified: Secondary | ICD-10-CM | POA: Diagnosis not present

## 2021-03-11 DIAGNOSIS — M1990 Unspecified osteoarthritis, unspecified site: Secondary | ICD-10-CM | POA: Diagnosis not present

## 2021-03-11 DIAGNOSIS — J449 Chronic obstructive pulmonary disease, unspecified: Secondary | ICD-10-CM | POA: Diagnosis not present

## 2021-03-11 DIAGNOSIS — L89620 Pressure ulcer of left heel, unstageable: Secondary | ICD-10-CM | POA: Diagnosis not present

## 2021-03-11 DIAGNOSIS — E785 Hyperlipidemia, unspecified: Secondary | ICD-10-CM | POA: Diagnosis not present

## 2021-03-11 DIAGNOSIS — I5042 Chronic combined systolic (congestive) and diastolic (congestive) heart failure: Secondary | ICD-10-CM | POA: Diagnosis not present

## 2021-03-11 DIAGNOSIS — E1169 Type 2 diabetes mellitus with other specified complication: Secondary | ICD-10-CM | POA: Diagnosis not present

## 2021-03-11 DIAGNOSIS — E1122 Type 2 diabetes mellitus with diabetic chronic kidney disease: Secondary | ICD-10-CM | POA: Diagnosis not present

## 2021-03-11 DIAGNOSIS — G4700 Insomnia, unspecified: Secondary | ICD-10-CM | POA: Diagnosis not present

## 2021-03-11 DIAGNOSIS — M86172 Other acute osteomyelitis, left ankle and foot: Secondary | ICD-10-CM | POA: Diagnosis not present

## 2021-03-11 DIAGNOSIS — Z8616 Personal history of COVID-19: Secondary | ICD-10-CM | POA: Diagnosis not present

## 2021-03-13 ENCOUNTER — Other Ambulatory Visit: Payer: Self-pay | Admitting: Family Medicine

## 2021-03-13 DIAGNOSIS — I10 Essential (primary) hypertension: Secondary | ICD-10-CM

## 2021-03-17 DIAGNOSIS — E1122 Type 2 diabetes mellitus with diabetic chronic kidney disease: Secondary | ICD-10-CM | POA: Diagnosis not present

## 2021-03-17 DIAGNOSIS — E11319 Type 2 diabetes mellitus with unspecified diabetic retinopathy without macular edema: Secondary | ICD-10-CM | POA: Diagnosis not present

## 2021-03-17 DIAGNOSIS — I5042 Chronic combined systolic (congestive) and diastolic (congestive) heart failure: Secondary | ICD-10-CM | POA: Diagnosis not present

## 2021-03-17 DIAGNOSIS — E785 Hyperlipidemia, unspecified: Secondary | ICD-10-CM | POA: Diagnosis not present

## 2021-03-17 DIAGNOSIS — I2510 Atherosclerotic heart disease of native coronary artery without angina pectoris: Secondary | ICD-10-CM | POA: Diagnosis not present

## 2021-03-17 DIAGNOSIS — J449 Chronic obstructive pulmonary disease, unspecified: Secondary | ICD-10-CM | POA: Diagnosis not present

## 2021-03-17 DIAGNOSIS — M6281 Muscle weakness (generalized): Secondary | ICD-10-CM | POA: Diagnosis not present

## 2021-03-17 DIAGNOSIS — M86172 Other acute osteomyelitis, left ankle and foot: Secondary | ICD-10-CM | POA: Diagnosis not present

## 2021-03-17 DIAGNOSIS — Z8616 Personal history of COVID-19: Secondary | ICD-10-CM | POA: Diagnosis not present

## 2021-03-17 DIAGNOSIS — I130 Hypertensive heart and chronic kidney disease with heart failure and stage 1 through stage 4 chronic kidney disease, or unspecified chronic kidney disease: Secondary | ICD-10-CM | POA: Diagnosis not present

## 2021-03-17 DIAGNOSIS — G4700 Insomnia, unspecified: Secondary | ICD-10-CM | POA: Diagnosis not present

## 2021-03-17 DIAGNOSIS — E1169 Type 2 diabetes mellitus with other specified complication: Secondary | ICD-10-CM | POA: Diagnosis not present

## 2021-03-17 DIAGNOSIS — N189 Chronic kidney disease, unspecified: Secondary | ICD-10-CM | POA: Diagnosis not present

## 2021-03-17 DIAGNOSIS — E1140 Type 2 diabetes mellitus with diabetic neuropathy, unspecified: Secondary | ICD-10-CM | POA: Diagnosis not present

## 2021-03-17 DIAGNOSIS — L89620 Pressure ulcer of left heel, unstageable: Secondary | ICD-10-CM | POA: Diagnosis not present

## 2021-03-17 DIAGNOSIS — K589 Irritable bowel syndrome without diarrhea: Secondary | ICD-10-CM | POA: Diagnosis not present

## 2021-03-17 DIAGNOSIS — K219 Gastro-esophageal reflux disease without esophagitis: Secondary | ICD-10-CM | POA: Diagnosis not present

## 2021-03-17 DIAGNOSIS — M1990 Unspecified osteoarthritis, unspecified site: Secondary | ICD-10-CM | POA: Diagnosis not present

## 2021-03-17 DIAGNOSIS — E1165 Type 2 diabetes mellitus with hyperglycemia: Secondary | ICD-10-CM | POA: Diagnosis not present

## 2021-03-17 DIAGNOSIS — Z87440 Personal history of urinary (tract) infections: Secondary | ICD-10-CM | POA: Diagnosis not present

## 2021-03-17 DIAGNOSIS — M810 Age-related osteoporosis without current pathological fracture: Secondary | ICD-10-CM | POA: Diagnosis not present

## 2021-03-17 DIAGNOSIS — N3281 Overactive bladder: Secondary | ICD-10-CM | POA: Diagnosis not present

## 2021-03-20 DIAGNOSIS — E1169 Type 2 diabetes mellitus with other specified complication: Secondary | ICD-10-CM | POA: Diagnosis not present

## 2021-03-20 DIAGNOSIS — E785 Hyperlipidemia, unspecified: Secondary | ICD-10-CM | POA: Diagnosis not present

## 2021-03-20 DIAGNOSIS — E1122 Type 2 diabetes mellitus with diabetic chronic kidney disease: Secondary | ICD-10-CM | POA: Diagnosis not present

## 2021-03-20 DIAGNOSIS — E1165 Type 2 diabetes mellitus with hyperglycemia: Secondary | ICD-10-CM | POA: Diagnosis not present

## 2021-03-20 DIAGNOSIS — M86172 Other acute osteomyelitis, left ankle and foot: Secondary | ICD-10-CM | POA: Diagnosis not present

## 2021-03-20 DIAGNOSIS — M810 Age-related osteoporosis without current pathological fracture: Secondary | ICD-10-CM | POA: Diagnosis not present

## 2021-03-20 DIAGNOSIS — E1140 Type 2 diabetes mellitus with diabetic neuropathy, unspecified: Secondary | ICD-10-CM | POA: Diagnosis not present

## 2021-03-20 DIAGNOSIS — L89620 Pressure ulcer of left heel, unstageable: Secondary | ICD-10-CM | POA: Diagnosis not present

## 2021-03-20 DIAGNOSIS — I5042 Chronic combined systolic (congestive) and diastolic (congestive) heart failure: Secondary | ICD-10-CM | POA: Diagnosis not present

## 2021-03-20 DIAGNOSIS — I2510 Atherosclerotic heart disease of native coronary artery without angina pectoris: Secondary | ICD-10-CM | POA: Diagnosis not present

## 2021-03-20 DIAGNOSIS — N3281 Overactive bladder: Secondary | ICD-10-CM | POA: Diagnosis not present

## 2021-03-20 DIAGNOSIS — E11319 Type 2 diabetes mellitus with unspecified diabetic retinopathy without macular edema: Secondary | ICD-10-CM | POA: Diagnosis not present

## 2021-03-20 DIAGNOSIS — I130 Hypertensive heart and chronic kidney disease with heart failure and stage 1 through stage 4 chronic kidney disease, or unspecified chronic kidney disease: Secondary | ICD-10-CM | POA: Diagnosis not present

## 2021-03-20 DIAGNOSIS — K589 Irritable bowel syndrome without diarrhea: Secondary | ICD-10-CM | POA: Diagnosis not present

## 2021-03-20 DIAGNOSIS — Z87440 Personal history of urinary (tract) infections: Secondary | ICD-10-CM | POA: Diagnosis not present

## 2021-03-20 DIAGNOSIS — K219 Gastro-esophageal reflux disease without esophagitis: Secondary | ICD-10-CM | POA: Diagnosis not present

## 2021-03-20 DIAGNOSIS — N189 Chronic kidney disease, unspecified: Secondary | ICD-10-CM | POA: Diagnosis not present

## 2021-03-20 DIAGNOSIS — M1990 Unspecified osteoarthritis, unspecified site: Secondary | ICD-10-CM | POA: Diagnosis not present

## 2021-03-20 DIAGNOSIS — M6281 Muscle weakness (generalized): Secondary | ICD-10-CM | POA: Diagnosis not present

## 2021-03-20 DIAGNOSIS — Z8616 Personal history of COVID-19: Secondary | ICD-10-CM | POA: Diagnosis not present

## 2021-03-20 DIAGNOSIS — G4700 Insomnia, unspecified: Secondary | ICD-10-CM | POA: Diagnosis not present

## 2021-03-20 DIAGNOSIS — J449 Chronic obstructive pulmonary disease, unspecified: Secondary | ICD-10-CM | POA: Diagnosis not present

## 2021-03-21 ENCOUNTER — Other Ambulatory Visit: Payer: Self-pay | Admitting: Family Medicine

## 2021-03-21 DIAGNOSIS — F419 Anxiety disorder, unspecified: Secondary | ICD-10-CM

## 2021-03-21 NOTE — Telephone Encounter (Signed)
last RF 01/20/21 #90 3 RF  Requested Prescriptions  Refused Prescriptions Disp Refills   hydrOXYzine (ATARAX) 25 MG tablet [Pharmacy Med Name: HYDROXYZINE HCL 25 MG TABLET] 270 tablet 2    Sig: TAKE 1 TABLET BY MOUTH THREE TIMES A DAY AS NEEDED     Ear, Nose, and Throat:  Antihistamines Passed - 03/21/2021 12:35 PM      Passed - Valid encounter within last 12 months    Recent Outpatient Visits          3 weeks ago 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 edema, with long-term current use of insulin [facility])   [facility] [name] T, FNP   1 month ago Hospital discharge follow-up   [facility] [name] T, FNP   4 months ago 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 edema, with long-term current use of insulin [facility])   [facility] [name] T, FNP   9 months ago Localized swelling of left lower extremity   [facility] [place], Anderson [place] M, PA-C   10 months ago 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 edema, with long-term current use of insulin [facility])   [place], Clearnce Sorrel, [place]      Future Appointments            In 3 weeks [place], Kathlene November, MD [facility], [facility]

## 2021-03-24 ENCOUNTER — Ambulatory Visit (INDEPENDENT_AMBULATORY_CARE_PROVIDER_SITE_OTHER): Payer: Medicare Other

## 2021-03-24 ENCOUNTER — Encounter: Payer: Self-pay | Admitting: Podiatry

## 2021-03-24 ENCOUNTER — Other Ambulatory Visit: Payer: Self-pay

## 2021-03-24 ENCOUNTER — Ambulatory Visit (INDEPENDENT_AMBULATORY_CARE_PROVIDER_SITE_OTHER): Payer: Medicare Other | Admitting: Podiatry

## 2021-03-24 DIAGNOSIS — L97422 Non-pressure chronic ulcer of left heel and midfoot with fat layer exposed: Secondary | ICD-10-CM

## 2021-03-24 NOTE — Progress Notes (Signed)
Subjective:  Patient presents today status post partial posterior calcanectomy with placement of antibiotic beads and application of wound graft left.  DOS: 11/20/2020.  Patient states that she is doing well.  She has been applying collagen with a light dressing to the wound.  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   Depression    Diabetes mellitus type 2, uncomplicated ([place])    Diabetic retinopathy ([place])    Dupuytren contracture 2011   s/p surgery to LEFT hand   Frequent urinary tract infections    GERD (gastroesophageal reflux disease)    Grade I diastolic dysfunction    HTN (hypertension)    Hyperlipidemia, unspecified    Irritable bowel syndrome    Meningitis 11/2019   MI (myocardial infarction) ([place]) 1994   no stent   MRSA (methicillin resistant staph aureus) culture positive    Neuromuscular disorder ([place])    nerve pain in back and legs   Osteoporosis    Pneumonia 2021   RBBB    Sepsis ([place]) 11/2019   Skin cancer    TIA (transient ischemic attack) 2014   Vitamin D deficiency, unspecified    Wheezing    Past Surgical History:  Procedure Laterality Date   BACK SURGERY     lower due to spinal stenosis   BONE BIOPSY Left 11/20/2020   Procedure: BONE BIOPSY;  Surgeon: Edrick Kins, DPM;  Location: [facility] ORS;  Service: Podiatry;  Laterality: Left;   CARDIAC CATHETERIZATION  1994   CATARACT EXTRACTION, BILATERAL Bilateral    COLONOSCOPY     x3   COLONOSCOPY WITH PROPOFOL N/A 02/08/2019   Procedure: COLONOSCOPY WITH PROPOFOL;  Surgeon: [name], MD;  Location: [facility] ENDOSCOPY;  Service: Gastroenterology;  Laterality: N/A;   CORONARY ANGIOPLASTY  1994   DUPUYTREN CONTRACTURE RELEASE Left 2011    HARDWARE REMOVAL Left 08/19/2020   Procedure: HARDWARE REMOVAL;  Surgeon: Hessie Knows, MD;  Location: [facility] ORS;  Service: Orthopedics;  Laterality: Left;   HIP FRACTURE SURGERY  11/2013   IRRIGATION AND DEBRIDEMENT FOOT Left 11/20/2020   Procedure: IRRIGATION AND DEBRIDEMENT FOOT-Heel Ulcer;  Surgeon: Edrick Kins, DPM;  Location: [facility] ORS;  Service: Podiatry;  Laterality: Left;   PR COLSC FLX W/REMOVAL LESION BY HOT BX FORCEPS   08/21/2015   Procedure: COLONOSCOPY, FLEXIBLE, PROXIMAL TO SPLENIC FLEXURE; W/REMOVAL TUMOR/POLYP/OTHER LESION, HOT BX FORCEP/CAUTE; Surgeon: Carlena Hurl, MD; Location: [facility] [place]; Service: General Surgery   REPAIR DURAL / CSF LEAK     to left sinus   TEE WITHOUT CARDIOVERSION N/A 11/20/2019   Procedure: TRANSESOPHAGEAL ECHOCARDIOGRAM (TEE);  Surgeon: Nelva Bush, MD;  Location: [facility] ORS;  Service: Cardiovascular;  Laterality: N/A;   TOTAL HIP ARTHROPLASTY Left 08/19/2020   Procedure: TOTAL HIP ARTHROPLASTY ANTERIOR APPROACH;  Surgeon: Hessie Knows, MD;  Location: [facility] ORS;  Service: Orthopedics;  Laterality: Left;   Allergies  Allergen Reactions   Chlorhexidine    Metformin And Related Diarrhea   Sulfa Antibiotics Rash       Objective/Physical Exam Neurovascular status intact.  No edema noted.  No erythema.  There continues to be slight serous drainage to the  posterior heel wound which measures approximately 1.5 x 0.8 x 0.3 cm.  There is improvement since last month.  Wound base is mostly fibrotic however.  Periwound is intact.  Radiographic exam 03/24/2021 LT foot: Osseous remodeling noted to the posterior tubercle of the calcaneus.  Radiographically it is hard to determine remodeling versus residual chronic osteomyelitis.  Recommend continued observation and routine serial x-rays  Assessment: 1. s/p partial calcanectomy with placement of antibiotic beads and application of wound VAC left.. DOS: 11/20/2020 2.  Ulcer left posterior heel  secondary to diabetes mellitus 3. H/O osteomyelitis LT calcaneus   Plan of Care:  1. Patient was evaluated.  X-rays reviewed 2.  Medically necessary excisional debridement including subcutaneous tissue and deep fascial tissue was performed using a tissue nipper.  Excisional debridement of all necrotic nonviable tissue down to healthier bleeding viable tissue was performed with postdebridement measurement same as pre- 3.  Continue collagen Prisma and dry sterile dressing daily 4.  Continue weightbearing in postsurgical shoe 5.  Patient was placed on Augmentin + Doxy x 2 weeks by ID which the patient has not completed.  Patient does not have a follow-up with infectious disease.  6.  Return to clinic in 4 weeks for follow-up and follow-up.  For now we will simply monitor the posterior wound for healing as well as serial x-rays to evaluate and observe the calcaneus   Edrick Kins, DPM [facility]  Dr. Edrick Kins, DPM    [address], [place] [address]                Office [phone]  Fax [phone]

## 2021-03-25 DIAGNOSIS — K589 Irritable bowel syndrome without diarrhea: Secondary | ICD-10-CM | POA: Diagnosis not present

## 2021-03-25 DIAGNOSIS — I130 Hypertensive heart and chronic kidney disease with heart failure and stage 1 through stage 4 chronic kidney disease, or unspecified chronic kidney disease: Secondary | ICD-10-CM | POA: Diagnosis not present

## 2021-03-25 DIAGNOSIS — E11319 Type 2 diabetes mellitus with unspecified diabetic retinopathy without macular edema: Secondary | ICD-10-CM | POA: Diagnosis not present

## 2021-03-25 DIAGNOSIS — E1169 Type 2 diabetes mellitus with other specified complication: Secondary | ICD-10-CM | POA: Diagnosis not present

## 2021-03-25 DIAGNOSIS — G4700 Insomnia, unspecified: Secondary | ICD-10-CM | POA: Diagnosis not present

## 2021-03-25 DIAGNOSIS — J449 Chronic obstructive pulmonary disease, unspecified: Secondary | ICD-10-CM | POA: Diagnosis not present

## 2021-03-25 DIAGNOSIS — E1140 Type 2 diabetes mellitus with diabetic neuropathy, unspecified: Secondary | ICD-10-CM | POA: Diagnosis not present

## 2021-03-25 DIAGNOSIS — I2510 Atherosclerotic heart disease of native coronary artery without angina pectoris: Secondary | ICD-10-CM | POA: Diagnosis not present

## 2021-03-25 DIAGNOSIS — Z8616 Personal history of COVID-19: Secondary | ICD-10-CM | POA: Diagnosis not present

## 2021-03-25 DIAGNOSIS — E785 Hyperlipidemia, unspecified: Secondary | ICD-10-CM | POA: Diagnosis not present

## 2021-03-25 DIAGNOSIS — M1990 Unspecified osteoarthritis, unspecified site: Secondary | ICD-10-CM | POA: Diagnosis not present

## 2021-03-25 DIAGNOSIS — K219 Gastro-esophageal reflux disease without esophagitis: Secondary | ICD-10-CM | POA: Diagnosis not present

## 2021-03-25 DIAGNOSIS — M6281 Muscle weakness (generalized): Secondary | ICD-10-CM | POA: Diagnosis not present

## 2021-03-25 DIAGNOSIS — Z7984 Long term (current) use of oral hypoglycemic drugs: Secondary | ICD-10-CM | POA: Diagnosis not present

## 2021-03-25 DIAGNOSIS — E1122 Type 2 diabetes mellitus with diabetic chronic kidney disease: Secondary | ICD-10-CM | POA: Diagnosis not present

## 2021-03-25 DIAGNOSIS — M86172 Other acute osteomyelitis, left ankle and foot: Secondary | ICD-10-CM | POA: Diagnosis not present

## 2021-03-25 DIAGNOSIS — N3281 Overactive bladder: Secondary | ICD-10-CM | POA: Diagnosis not present

## 2021-03-25 DIAGNOSIS — E1165 Type 2 diabetes mellitus with hyperglycemia: Secondary | ICD-10-CM | POA: Diagnosis not present

## 2021-03-25 DIAGNOSIS — L89620 Pressure ulcer of left heel, unstageable: Secondary | ICD-10-CM | POA: Diagnosis not present

## 2021-03-25 DIAGNOSIS — M810 Age-related osteoporosis without current pathological fracture: Secondary | ICD-10-CM | POA: Diagnosis not present

## 2021-03-25 DIAGNOSIS — N189 Chronic kidney disease, unspecified: Secondary | ICD-10-CM | POA: Diagnosis not present

## 2021-03-25 DIAGNOSIS — Z87440 Personal history of urinary (tract) infections: Secondary | ICD-10-CM | POA: Diagnosis not present

## 2021-03-25 DIAGNOSIS — I5042 Chronic combined systolic (congestive) and diastolic (congestive) heart failure: Secondary | ICD-10-CM | POA: Diagnosis not present

## 2021-03-26 DIAGNOSIS — E1165 Type 2 diabetes mellitus with hyperglycemia: Secondary | ICD-10-CM | POA: Diagnosis not present

## 2021-03-26 DIAGNOSIS — I2510 Atherosclerotic heart disease of native coronary artery without angina pectoris: Secondary | ICD-10-CM | POA: Diagnosis not present

## 2021-03-26 DIAGNOSIS — E1122 Type 2 diabetes mellitus with diabetic chronic kidney disease: Secondary | ICD-10-CM | POA: Diagnosis not present

## 2021-03-26 DIAGNOSIS — M86172 Other acute osteomyelitis, left ankle and foot: Secondary | ICD-10-CM | POA: Diagnosis not present

## 2021-03-26 DIAGNOSIS — Z7984 Long term (current) use of oral hypoglycemic drugs: Secondary | ICD-10-CM | POA: Diagnosis not present

## 2021-03-26 DIAGNOSIS — G4700 Insomnia, unspecified: Secondary | ICD-10-CM | POA: Diagnosis not present

## 2021-03-26 DIAGNOSIS — M810 Age-related osteoporosis without current pathological fracture: Secondary | ICD-10-CM | POA: Diagnosis not present

## 2021-03-26 DIAGNOSIS — N189 Chronic kidney disease, unspecified: Secondary | ICD-10-CM | POA: Diagnosis not present

## 2021-03-26 DIAGNOSIS — M1990 Unspecified osteoarthritis, unspecified site: Secondary | ICD-10-CM | POA: Diagnosis not present

## 2021-03-26 DIAGNOSIS — K589 Irritable bowel syndrome without diarrhea: Secondary | ICD-10-CM | POA: Diagnosis not present

## 2021-03-26 DIAGNOSIS — Z8616 Personal history of COVID-19: Secondary | ICD-10-CM | POA: Diagnosis not present

## 2021-03-26 DIAGNOSIS — E1140 Type 2 diabetes mellitus with diabetic neuropathy, unspecified: Secondary | ICD-10-CM | POA: Diagnosis not present

## 2021-03-26 DIAGNOSIS — E1169 Type 2 diabetes mellitus with other specified complication: Secondary | ICD-10-CM | POA: Diagnosis not present

## 2021-03-26 DIAGNOSIS — E785 Hyperlipidemia, unspecified: Secondary | ICD-10-CM | POA: Diagnosis not present

## 2021-03-26 DIAGNOSIS — I5042 Chronic combined systolic (congestive) and diastolic (congestive) heart failure: Secondary | ICD-10-CM | POA: Diagnosis not present

## 2021-03-26 DIAGNOSIS — J449 Chronic obstructive pulmonary disease, unspecified: Secondary | ICD-10-CM | POA: Diagnosis not present

## 2021-03-26 DIAGNOSIS — Z87440 Personal history of urinary (tract) infections: Secondary | ICD-10-CM | POA: Diagnosis not present

## 2021-03-26 DIAGNOSIS — L89620 Pressure ulcer of left heel, unstageable: Secondary | ICD-10-CM | POA: Diagnosis not present

## 2021-03-26 DIAGNOSIS — E11319 Type 2 diabetes mellitus with unspecified diabetic retinopathy without macular edema: Secondary | ICD-10-CM | POA: Diagnosis not present

## 2021-03-26 DIAGNOSIS — N3281 Overactive bladder: Secondary | ICD-10-CM | POA: Diagnosis not present

## 2021-03-26 DIAGNOSIS — M6281 Muscle weakness (generalized): Secondary | ICD-10-CM | POA: Diagnosis not present

## 2021-03-26 DIAGNOSIS — K219 Gastro-esophageal reflux disease without esophagitis: Secondary | ICD-10-CM | POA: Diagnosis not present

## 2021-03-26 DIAGNOSIS — I130 Hypertensive heart and chronic kidney disease with heart failure and stage 1 through stage 4 chronic kidney disease, or unspecified chronic kidney disease: Secondary | ICD-10-CM | POA: Diagnosis not present

## 2021-03-30 ENCOUNTER — Other Ambulatory Visit: Payer: Self-pay

## 2021-03-30 ENCOUNTER — Other Ambulatory Visit: Payer: Self-pay | Admitting: Family Medicine

## 2021-03-30 MED ORDER — TRAMADOL HCL 100 MG PO TABS: 100.0000 mg | ORAL_TABLET | Freq: Every day | ORAL | 0 refills | Status: DC

## 2021-03-30 NOTE — Telephone Encounter (Signed)
Needs oxycodone refill  [facility] [place]  Last RF 12/22/20 #12 pills RF due  Med not delegated to NT to RF  Requested Prescriptions  Pending Prescriptions Disp Refills   oxyCODONE (OXY IR/ROXICODONE) 5 MG immediate release tablet 12 tablet 0    Sig: Take 1 tablet (5 mg total) by mouth every 6 (six) hours as needed for severe pain or moderate pain.     There is no refill protocol information for this order

## 2021-03-30 NOTE — Telephone Encounter (Signed)
LOV: 02/24/2021  NOV: None  Last Refill: 12/22/2020 #12 0 Refill  Thanks,   Mickel Baas

## 2021-03-31 DIAGNOSIS — L89620 Pressure ulcer of left heel, unstageable: Secondary | ICD-10-CM | POA: Diagnosis not present

## 2021-04-01 DIAGNOSIS — M86172 Other acute osteomyelitis, left ankle and foot: Secondary | ICD-10-CM | POA: Diagnosis not present

## 2021-04-01 DIAGNOSIS — Z8616 Personal history of COVID-19: Secondary | ICD-10-CM | POA: Diagnosis not present

## 2021-04-01 DIAGNOSIS — I130 Hypertensive heart and chronic kidney disease with heart failure and stage 1 through stage 4 chronic kidney disease, or unspecified chronic kidney disease: Secondary | ICD-10-CM | POA: Diagnosis not present

## 2021-04-01 DIAGNOSIS — E1165 Type 2 diabetes mellitus with hyperglycemia: Secondary | ICD-10-CM | POA: Diagnosis not present

## 2021-04-01 DIAGNOSIS — E11319 Type 2 diabetes mellitus with unspecified diabetic retinopathy without macular edema: Secondary | ICD-10-CM | POA: Diagnosis not present

## 2021-04-01 DIAGNOSIS — K589 Irritable bowel syndrome without diarrhea: Secondary | ICD-10-CM | POA: Diagnosis not present

## 2021-04-01 DIAGNOSIS — E1169 Type 2 diabetes mellitus with other specified complication: Secondary | ICD-10-CM | POA: Diagnosis not present

## 2021-04-01 DIAGNOSIS — E785 Hyperlipidemia, unspecified: Secondary | ICD-10-CM | POA: Diagnosis not present

## 2021-04-01 DIAGNOSIS — N3281 Overactive bladder: Secondary | ICD-10-CM | POA: Diagnosis not present

## 2021-04-01 DIAGNOSIS — J449 Chronic obstructive pulmonary disease, unspecified: Secondary | ICD-10-CM | POA: Diagnosis not present

## 2021-04-01 DIAGNOSIS — K219 Gastro-esophageal reflux disease without esophagitis: Secondary | ICD-10-CM | POA: Diagnosis not present

## 2021-04-01 DIAGNOSIS — G4700 Insomnia, unspecified: Secondary | ICD-10-CM | POA: Diagnosis not present

## 2021-04-01 DIAGNOSIS — L89620 Pressure ulcer of left heel, unstageable: Secondary | ICD-10-CM | POA: Diagnosis not present

## 2021-04-01 DIAGNOSIS — M6281 Muscle weakness (generalized): Secondary | ICD-10-CM | POA: Diagnosis not present

## 2021-04-01 DIAGNOSIS — N189 Chronic kidney disease, unspecified: Secondary | ICD-10-CM | POA: Diagnosis not present

## 2021-04-01 DIAGNOSIS — E1140 Type 2 diabetes mellitus with diabetic neuropathy, unspecified: Secondary | ICD-10-CM | POA: Diagnosis not present

## 2021-04-01 DIAGNOSIS — I2510 Atherosclerotic heart disease of native coronary artery without angina pectoris: Secondary | ICD-10-CM | POA: Diagnosis not present

## 2021-04-01 DIAGNOSIS — M1990 Unspecified osteoarthritis, unspecified site: Secondary | ICD-10-CM | POA: Diagnosis not present

## 2021-04-01 DIAGNOSIS — Z7984 Long term (current) use of oral hypoglycemic drugs: Secondary | ICD-10-CM | POA: Diagnosis not present

## 2021-04-01 DIAGNOSIS — E1122 Type 2 diabetes mellitus with diabetic chronic kidney disease: Secondary | ICD-10-CM | POA: Diagnosis not present

## 2021-04-01 DIAGNOSIS — I5042 Chronic combined systolic (congestive) and diastolic (congestive) heart failure: Secondary | ICD-10-CM | POA: Diagnosis not present

## 2021-04-01 DIAGNOSIS — Z87440 Personal history of urinary (tract) infections: Secondary | ICD-10-CM | POA: Diagnosis not present

## 2021-04-01 DIAGNOSIS — M810 Age-related osteoporosis without current pathological fracture: Secondary | ICD-10-CM | POA: Diagnosis not present

## 2021-04-02 ENCOUNTER — Other Ambulatory Visit: Payer: Self-pay

## 2021-04-02 ENCOUNTER — Encounter: Payer: Self-pay | Admitting: Family Medicine

## 2021-04-02 ENCOUNTER — Encounter: Payer: Self-pay | Admitting: Podiatry

## 2021-04-02 ENCOUNTER — Ambulatory Visit (INDEPENDENT_AMBULATORY_CARE_PROVIDER_SITE_OTHER): Payer: Medicare Other | Admitting: Family Medicine

## 2021-04-02 ENCOUNTER — Encounter: Payer: Self-pay | Admitting: Infectious Diseases

## 2021-04-02 DIAGNOSIS — E1169 Type 2 diabetes mellitus with other specified complication: Secondary | ICD-10-CM

## 2021-04-02 DIAGNOSIS — G8929 Other chronic pain: Secondary | ICD-10-CM | POA: Diagnosis not present

## 2021-04-02 DIAGNOSIS — S91302A Unspecified open wound, left foot, initial encounter: Secondary | ICD-10-CM

## 2021-04-02 DIAGNOSIS — M79672 Pain in left foot: Secondary | ICD-10-CM

## 2021-04-02 DIAGNOSIS — E785 Hyperlipidemia, unspecified: Secondary | ICD-10-CM | POA: Diagnosis not present

## 2021-04-02 NOTE — Assessment & Plan Note (Signed)
Continues to f/u with podiatry

## 2021-04-02 NOTE — Progress Notes (Signed)
Virtual telephone visit    Virtual Visit via Telephone Note   This visit type was conducted due to national recommendations for restrictions regarding the COVID-19 Pandemic (e.g. social distancing) in an effort to limit this patient's exposure and mitigate transmission in our community. Due to her co-morbid illnesses, this patient is at least at moderate risk for complications without adequate follow up. This format is felt to be most appropriate for this patient at this time. The patient did not have access to video technology or had technical difficulties with video requiring transitioning to audio format only (telephone). Physical exam was limited to content and character of the telephone converstion.    Patient location: home Provider location: [facility]  I discussed the limitations of evaluation and management by telemedicine and the availability of in person appointments. The patient expressed understanding and agreed to proceed.   Visit Date: 04/02/2021  Today's healthcare provider: [name], FNP   Chief Complaint  Patient presents with   Medication Refill   Subjective    HPI HPI   Patient presents today to discuss with PCP about filling her prescription for Oxycodone, patient states that she was previously prescribed medication after being discharged from hospital and into rehab.  Last edited by Minette Headland, CMA on 04/02/2021 10:26 AM.         Medications: Outpatient Medications Prior to Visit  Medication Sig   acetaminophen (TYLENOL) 325 MG tablet Take 2 tablets (650 mg total) by mouth every 6 (six) hours as needed for mild pain (or Fever >/= 101).   Alcohol Swabs (ALCOHOL PREP) PADS Use twice daily prior to SQ injection of insulin to clean skin   amoxicillin-clavulanate (AUGMENTIN) 875-125 MG tablet Take 1 tablet by mouth 2 (two) times daily.   atorvastatin (LIPITOR) 20 MG tablet Take 1 tablet (20 mg total) by mouth daily. (Patient taking differently: Take  20 mg by mouth at bedtime.)   Calcium Carbonate-Vitamin D 600-400 MG-UNIT tablet Take 1 tablet by mouth 2 (two) times daily.   Cyanocobalamin (B-12) 1000 MCG CAPS Take 1,000 mcg by mouth daily.    furosemide (LASIX) 20 MG tablet Take 20 mg by mouth daily.   hydrOXYzine (ATARAX/VISTARIL) 25 MG tablet Take 1 tablet (25 mg total) by mouth 3 (three) times daily as needed.   Insulin Pen Needle (TRUEPLUS PEN NEEDLES) 31G X 6 MM MISC Use with lantus two times daily.   INSULIN SYRINGE 1CC/29G 29G X 1/2" 1 ML MISC For lantus injections twice daily   LANTUS SOLOSTAR 100 UNIT/ML Solostar Pen Inject 20 Units into the skin at bedtime.   losartan (COZAAR) 25 MG tablet TAKE 1 TABLET BY MOUTH EVERY DAY   metoprolol succinate (TOPROL-XL) 25 MG 24 hr tablet TAKE 1 TABLET BY MOUTH EVERY DAY   Multiple Vitamins-Minerals (PRESERVISION AREDS 2 PO) Take 1 tablet by mouth in the morning and at bedtime.   Omega-3 1000 MG CAPS Take 1,000 mg by mouth daily.   omeprazole (PRILOSEC) 40 MG capsule TAKE 1 CAPSULE BY MOUTH EVERY DAY   OneTouch Delica Lancets 08M MISC To check blood sugar daily  DX: E11.9   ONETOUCH VERIO test strip TO CHECK BLOOD SUGAR ONCE DAILY.   oxybutynin (DITROPAN-XL) 10 MG 24 hr tablet TAKE 1 TABLET BY MOUTH EVERYDAY AT BEDTIME   oxyCODONE (OXY IR/ROXICODONE) 5 MG immediate release tablet Take 1 tablet (5 mg total) by mouth every 6 (six) hours as needed for severe pain or moderate pain.  potassium chloride (KLOR-CON) 10 MEQ tablet Take 10 mEq by mouth daily.   sertraline (ZOLOFT) 25 MG tablet Take 1 tablet (25 mg total) by mouth daily.   traMADol HCl 100 MG TABS Take 100 mg by mouth daily.   traZODone (DESYREL) 100 MG tablet Take 0.5 tablets (50 mg total) by mouth at bedtime.   TRULICITY 1.5 GN/5.6OZ SOPN INJECT 1.5MG  INTO THE SKIN ONCE A WEEK AS DIRECTED   vitamin C (ASCORBIC ACID) 500 MG tablet Take 500 mg by mouth 2 (two) times daily.   Vitamin D, Ergocalciferol, (DRISDOL) 1.25 MG (50000 UNIT)  CAPS capsule TAKE ONE CAPSULE BY MOUTH ONCE WEEKLY ON THE SAME DAY EACH WEEK (ON FRIDAYS) (Patient taking differently: Take 50,000 Units by mouth every 7 (seven) days.)   zinc gluconate 50 MG tablet Take 50 mg by mouth daily.   No facility-administered medications prior to visit.    Review of Systems    Objective    There were no vitals taken for this visit.     Assessment & Plan     Problem List Items Addressed This Visit       Endocrine   Type 2 diabetes mellitus with hyperlipidemia (HCC)    Previous stable Continue goal of A1c of 7.5% or less Encourage LDL <70 Continue to recommend balanced, lower carb meals. Smaller meal size, adding snacks. Choosing water as drink of choice and increasing purposeful exercise. Due for return visit in 1 month        Other   Non healing left heel wound    Continues to f/u with podiatry       Chronic heel pain, left - Primary    Request for Oxycodone; explained to patient that Tramadol was sent in in place given chronic pain and infrequent use; encouraged patient Pt misunderstood and thought the Tramadol was trazodone Encouraged pt to pick up medication; reach out if not effective         Return in about 4 weeks (around 04/30/2021) for chonic disease management- CMA will f/u.    I discussed the assessment and treatment plan with the patient. The patient was provided an opportunity to ask questions and all were answered. The patient agreed with the plan and demonstrated an understanding of the instructions.   The patient was advised to call back or seek an in-person evaluation if the symptoms worsen or if the condition fails to improve as anticipated.  I provided 7 minutes of non-face-to-face time during this encounter.  Vonna Kotyk, FNP, have reviewed all documentation for this visit. The documentation on 04/02/21 for the exam, diagnosis, procedures, and orders are all accurate and complete.   [name],  [place] [phone] (phone) [phone] (fax)  [place]

## 2021-04-02 NOTE — Assessment & Plan Note (Signed)
Previous stable Continue goal of A1c of 7.5% or less Encourage LDL <70 Continue to recommend balanced, lower carb meals. Smaller meal size, adding snacks. Choosing water as drink of choice and increasing purposeful exercise. Due for return visit in 1 month

## 2021-04-02 NOTE — Assessment & Plan Note (Signed)
Request for Oxycodone; explained to patient that Tramadol was sent in in place given chronic pain and infrequent use; encouraged patient Pt misunderstood and thought the Tramadol was trazodone Encouraged pt to pick up medication; reach out if not effective

## 2021-04-03 ENCOUNTER — Telehealth: Payer: Self-pay | Admitting: Family Medicine

## 2021-04-03 NOTE — Telephone Encounter (Signed)
Copied from [place] [id number]. Topic: Medicare AWV >> Apr 03, 2021  2:56 PM Cher Nakai R wrote: Reason for CRM:  Left message for patient to call back and schedule Medicare Annual Wellness Visit (AWV) in office.   If not able to come in office, please offer to do virtually or by telephone.   Last AWV: 07/09/2019  Please schedule at anytime with [facility] Health Advisor.  If any questions, please contact me at [phone]

## 2021-04-06 ENCOUNTER — Telehealth: Payer: Self-pay | Admitting: Family Medicine

## 2021-04-06 NOTE — Telephone Encounter (Signed)
Please advise 

## 2021-04-06 NOTE — Telephone Encounter (Signed)
Pt is calling to ask advice Andrea [name] about the traMADol HCl 100 MG TABS [[id number]] Pt states that her insurance with not pay for this or the tramadol HCI 50mg . Pt is wanting to know can an alternative medication be [facility] [place] [place].  [phone]

## 2021-04-06 NOTE — Telephone Encounter (Signed)
Pharmacy called again about the Tramadol Rx / he asked if provider can send over a new Rx for 50MG  tabs instead of the 100mg  tabs due to not commonly having these in stock and insurance not usually covering those / please advise

## 2021-04-07 ENCOUNTER — Other Ambulatory Visit: Payer: Self-pay | Admitting: Family Medicine

## 2021-04-07 ENCOUNTER — Other Ambulatory Visit: Payer: Self-pay

## 2021-04-07 ENCOUNTER — Ambulatory Visit (INDEPENDENT_AMBULATORY_CARE_PROVIDER_SITE_OTHER): Payer: Medicare Other

## 2021-04-07 DIAGNOSIS — I5020 Unspecified systolic (congestive) heart failure: Secondary | ICD-10-CM | POA: Diagnosis not present

## 2021-04-07 LAB — ECHOCARDIOGRAM COMPLETE
AR max vel: 2.03 cm2
AV Area VTI: 2.17 cm2
AV Area mean vel: 2.1 cm2
AV Mean grad: 3 mmHg
AV Peak grad: 5.5 mmHg
Ao pk vel: 1.17 m/s
Area-P 1/2: 4.74 cm2
S' Lateral: 4.9 cm

## 2021-04-07 MED ORDER — PERFLUTREN LIPID MICROSPHERE
1.0000 mL | INTRAVENOUS | Status: AC | PRN
  Administered 2021-04-07: 2 mL via INTRAVENOUS

## 2021-04-07 MED ORDER — TRAMADOL HCL 50 MG PO TABS: 50.0000 mg | ORAL_TABLET | ORAL | 0 refills | Status: AC | PRN

## 2021-04-07 NOTE — Telephone Encounter (Signed)
Please review pharmacist message. KW

## 2021-04-08 ENCOUNTER — Telehealth: Payer: Self-pay | Admitting: Nurse Practitioner

## 2021-04-08 DIAGNOSIS — L89620 Pressure ulcer of left heel, unstageable: Secondary | ICD-10-CM | POA: Diagnosis not present

## 2021-04-08 DIAGNOSIS — G4700 Insomnia, unspecified: Secondary | ICD-10-CM | POA: Diagnosis not present

## 2021-04-08 DIAGNOSIS — E1169 Type 2 diabetes mellitus with other specified complication: Secondary | ICD-10-CM | POA: Diagnosis not present

## 2021-04-08 DIAGNOSIS — M86172 Other acute osteomyelitis, left ankle and foot: Secondary | ICD-10-CM | POA: Diagnosis not present

## 2021-04-08 DIAGNOSIS — M6281 Muscle weakness (generalized): Secondary | ICD-10-CM | POA: Diagnosis not present

## 2021-04-08 DIAGNOSIS — Z87440 Personal history of urinary (tract) infections: Secondary | ICD-10-CM | POA: Diagnosis not present

## 2021-04-08 DIAGNOSIS — K219 Gastro-esophageal reflux disease without esophagitis: Secondary | ICD-10-CM | POA: Diagnosis not present

## 2021-04-08 DIAGNOSIS — E1140 Type 2 diabetes mellitus with diabetic neuropathy, unspecified: Secondary | ICD-10-CM | POA: Diagnosis not present

## 2021-04-08 DIAGNOSIS — E1122 Type 2 diabetes mellitus with diabetic chronic kidney disease: Secondary | ICD-10-CM | POA: Diagnosis not present

## 2021-04-08 DIAGNOSIS — E11319 Type 2 diabetes mellitus with unspecified diabetic retinopathy without macular edema: Secondary | ICD-10-CM | POA: Diagnosis not present

## 2021-04-08 DIAGNOSIS — I130 Hypertensive heart and chronic kidney disease with heart failure and stage 1 through stage 4 chronic kidney disease, or unspecified chronic kidney disease: Secondary | ICD-10-CM | POA: Diagnosis not present

## 2021-04-08 DIAGNOSIS — N3281 Overactive bladder: Secondary | ICD-10-CM | POA: Diagnosis not present

## 2021-04-08 DIAGNOSIS — N189 Chronic kidney disease, unspecified: Secondary | ICD-10-CM | POA: Diagnosis not present

## 2021-04-08 DIAGNOSIS — H353131 Nonexudative age-related macular degeneration, bilateral, early dry stage: Secondary | ICD-10-CM | POA: Diagnosis not present

## 2021-04-08 DIAGNOSIS — E785 Hyperlipidemia, unspecified: Secondary | ICD-10-CM | POA: Diagnosis not present

## 2021-04-08 DIAGNOSIS — I5042 Chronic combined systolic (congestive) and diastolic (congestive) heart failure: Secondary | ICD-10-CM | POA: Diagnosis not present

## 2021-04-08 DIAGNOSIS — M810 Age-related osteoporosis without current pathological fracture: Secondary | ICD-10-CM | POA: Diagnosis not present

## 2021-04-08 DIAGNOSIS — E1165 Type 2 diabetes mellitus with hyperglycemia: Secondary | ICD-10-CM | POA: Diagnosis not present

## 2021-04-08 DIAGNOSIS — Z7984 Long term (current) use of oral hypoglycemic drugs: Secondary | ICD-10-CM | POA: Diagnosis not present

## 2021-04-08 DIAGNOSIS — Z8616 Personal history of COVID-19: Secondary | ICD-10-CM | POA: Diagnosis not present

## 2021-04-08 DIAGNOSIS — M1990 Unspecified osteoarthritis, unspecified site: Secondary | ICD-10-CM | POA: Diagnosis not present

## 2021-04-08 DIAGNOSIS — K589 Irritable bowel syndrome without diarrhea: Secondary | ICD-10-CM | POA: Diagnosis not present

## 2021-04-08 DIAGNOSIS — J449 Chronic obstructive pulmonary disease, unspecified: Secondary | ICD-10-CM | POA: Diagnosis not present

## 2021-04-08 DIAGNOSIS — I2510 Atherosclerotic heart disease of native coronary artery without angina pectoris: Secondary | ICD-10-CM | POA: Diagnosis not present

## 2021-04-08 LAB — HM DIABETES EYE EXAM

## 2021-04-08 NOTE — Telephone Encounter (Signed)
Theora Gianotti, NP  04/08/2021  2:40 PM EST Back to Top    Moderately reduced heart squeezing function at 40-45% (normal 55-60%).  No significant valvular disease.  Overall no change from 2021 echo.  It appears that this was ordered back in June.  Follow-up with Dr. Rockey Situ as scheduled to discuss further.

## 2021-04-08 NOTE — Telephone Encounter (Signed)
Called to give the patient echo results. Lmtcb. 

## 2021-04-09 NOTE — Telephone Encounter (Signed)
Left voicemail message.

## 2021-04-09 NOTE — Telephone Encounter (Signed)
Attempt #2 to reach out to pt regarding ECHO results LMTCB

## 2021-04-09 NOTE — Telephone Encounter (Signed)
Reviewed results and recommendations with patient. She verbalized understanding with no further questions at this time.  ?

## 2021-04-14 ENCOUNTER — Ambulatory Visit: Payer: Medicare Other | Admitting: Cardiovascular Disease

## 2021-04-14 DIAGNOSIS — E785 Hyperlipidemia, unspecified: Secondary | ICD-10-CM | POA: Diagnosis not present

## 2021-04-14 DIAGNOSIS — M810 Age-related osteoporosis without current pathological fracture: Secondary | ICD-10-CM | POA: Diagnosis not present

## 2021-04-14 DIAGNOSIS — Z8616 Personal history of COVID-19: Secondary | ICD-10-CM | POA: Diagnosis not present

## 2021-04-14 DIAGNOSIS — Z87440 Personal history of urinary (tract) infections: Secondary | ICD-10-CM | POA: Diagnosis not present

## 2021-04-14 DIAGNOSIS — K589 Irritable bowel syndrome without diarrhea: Secondary | ICD-10-CM | POA: Diagnosis not present

## 2021-04-14 DIAGNOSIS — K219 Gastro-esophageal reflux disease without esophagitis: Secondary | ICD-10-CM | POA: Diagnosis not present

## 2021-04-14 DIAGNOSIS — G4700 Insomnia, unspecified: Secondary | ICD-10-CM | POA: Diagnosis not present

## 2021-04-14 DIAGNOSIS — Z7984 Long term (current) use of oral hypoglycemic drugs: Secondary | ICD-10-CM | POA: Diagnosis not present

## 2021-04-14 DIAGNOSIS — E1122 Type 2 diabetes mellitus with diabetic chronic kidney disease: Secondary | ICD-10-CM | POA: Diagnosis not present

## 2021-04-14 DIAGNOSIS — I130 Hypertensive heart and chronic kidney disease with heart failure and stage 1 through stage 4 chronic kidney disease, or unspecified chronic kidney disease: Secondary | ICD-10-CM | POA: Diagnosis not present

## 2021-04-14 DIAGNOSIS — M1990 Unspecified osteoarthritis, unspecified site: Secondary | ICD-10-CM | POA: Diagnosis not present

## 2021-04-14 DIAGNOSIS — E11319 Type 2 diabetes mellitus with unspecified diabetic retinopathy without macular edema: Secondary | ICD-10-CM | POA: Diagnosis not present

## 2021-04-14 DIAGNOSIS — M6281 Muscle weakness (generalized): Secondary | ICD-10-CM | POA: Diagnosis not present

## 2021-04-14 DIAGNOSIS — I2510 Atherosclerotic heart disease of native coronary artery without angina pectoris: Secondary | ICD-10-CM | POA: Diagnosis not present

## 2021-04-14 DIAGNOSIS — E1140 Type 2 diabetes mellitus with diabetic neuropathy, unspecified: Secondary | ICD-10-CM | POA: Diagnosis not present

## 2021-04-14 DIAGNOSIS — E1165 Type 2 diabetes mellitus with hyperglycemia: Secondary | ICD-10-CM | POA: Diagnosis not present

## 2021-04-14 DIAGNOSIS — J449 Chronic obstructive pulmonary disease, unspecified: Secondary | ICD-10-CM | POA: Diagnosis not present

## 2021-04-14 DIAGNOSIS — M86172 Other acute osteomyelitis, left ankle and foot: Secondary | ICD-10-CM | POA: Diagnosis not present

## 2021-04-14 DIAGNOSIS — I5042 Chronic combined systolic (congestive) and diastolic (congestive) heart failure: Secondary | ICD-10-CM | POA: Diagnosis not present

## 2021-04-14 DIAGNOSIS — N3281 Overactive bladder: Secondary | ICD-10-CM | POA: Diagnosis not present

## 2021-04-14 DIAGNOSIS — N189 Chronic kidney disease, unspecified: Secondary | ICD-10-CM | POA: Diagnosis not present

## 2021-04-14 DIAGNOSIS — E1169 Type 2 diabetes mellitus with other specified complication: Secondary | ICD-10-CM | POA: Diagnosis not present

## 2021-04-14 DIAGNOSIS — L89620 Pressure ulcer of left heel, unstageable: Secondary | ICD-10-CM | POA: Diagnosis not present

## 2021-04-15 DIAGNOSIS — M6281 Muscle weakness (generalized): Secondary | ICD-10-CM | POA: Diagnosis not present

## 2021-04-15 DIAGNOSIS — K589 Irritable bowel syndrome without diarrhea: Secondary | ICD-10-CM | POA: Diagnosis not present

## 2021-04-15 DIAGNOSIS — E1122 Type 2 diabetes mellitus with diabetic chronic kidney disease: Secondary | ICD-10-CM | POA: Diagnosis not present

## 2021-04-15 DIAGNOSIS — G4700 Insomnia, unspecified: Secondary | ICD-10-CM | POA: Diagnosis not present

## 2021-04-15 DIAGNOSIS — E785 Hyperlipidemia, unspecified: Secondary | ICD-10-CM | POA: Diagnosis not present

## 2021-04-15 DIAGNOSIS — I130 Hypertensive heart and chronic kidney disease with heart failure and stage 1 through stage 4 chronic kidney disease, or unspecified chronic kidney disease: Secondary | ICD-10-CM | POA: Diagnosis not present

## 2021-04-15 DIAGNOSIS — N3281 Overactive bladder: Secondary | ICD-10-CM | POA: Diagnosis not present

## 2021-04-15 DIAGNOSIS — E1169 Type 2 diabetes mellitus with other specified complication: Secondary | ICD-10-CM | POA: Diagnosis not present

## 2021-04-15 DIAGNOSIS — I5042 Chronic combined systolic (congestive) and diastolic (congestive) heart failure: Secondary | ICD-10-CM | POA: Diagnosis not present

## 2021-04-15 DIAGNOSIS — Z8616 Personal history of COVID-19: Secondary | ICD-10-CM | POA: Diagnosis not present

## 2021-04-15 DIAGNOSIS — L89620 Pressure ulcer of left heel, unstageable: Secondary | ICD-10-CM | POA: Diagnosis not present

## 2021-04-15 DIAGNOSIS — I2510 Atherosclerotic heart disease of native coronary artery without angina pectoris: Secondary | ICD-10-CM | POA: Diagnosis not present

## 2021-04-15 DIAGNOSIS — Z7984 Long term (current) use of oral hypoglycemic drugs: Secondary | ICD-10-CM | POA: Diagnosis not present

## 2021-04-15 DIAGNOSIS — J449 Chronic obstructive pulmonary disease, unspecified: Secondary | ICD-10-CM | POA: Diagnosis not present

## 2021-04-15 DIAGNOSIS — E1165 Type 2 diabetes mellitus with hyperglycemia: Secondary | ICD-10-CM | POA: Diagnosis not present

## 2021-04-15 DIAGNOSIS — E11319 Type 2 diabetes mellitus with unspecified diabetic retinopathy without macular edema: Secondary | ICD-10-CM | POA: Diagnosis not present

## 2021-04-15 DIAGNOSIS — E1140 Type 2 diabetes mellitus with diabetic neuropathy, unspecified: Secondary | ICD-10-CM | POA: Diagnosis not present

## 2021-04-15 DIAGNOSIS — M810 Age-related osteoporosis without current pathological fracture: Secondary | ICD-10-CM | POA: Diagnosis not present

## 2021-04-15 DIAGNOSIS — K219 Gastro-esophageal reflux disease without esophagitis: Secondary | ICD-10-CM | POA: Diagnosis not present

## 2021-04-15 DIAGNOSIS — N189 Chronic kidney disease, unspecified: Secondary | ICD-10-CM | POA: Diagnosis not present

## 2021-04-15 DIAGNOSIS — Z87440 Personal history of urinary (tract) infections: Secondary | ICD-10-CM | POA: Diagnosis not present

## 2021-04-15 DIAGNOSIS — M1990 Unspecified osteoarthritis, unspecified site: Secondary | ICD-10-CM | POA: Diagnosis not present

## 2021-04-15 DIAGNOSIS — M86172 Other acute osteomyelitis, left ankle and foot: Secondary | ICD-10-CM | POA: Diagnosis not present

## 2021-04-17 ENCOUNTER — Other Ambulatory Visit: Payer: Self-pay | Admitting: Physician Assistant

## 2021-04-17 DIAGNOSIS — R2242 Localized swelling, mass and lump, left lower limb: Secondary | ICD-10-CM

## 2021-04-20 ENCOUNTER — Other Ambulatory Visit: Payer: Self-pay | Admitting: Family Medicine

## 2021-04-20 DIAGNOSIS — F419 Anxiety disorder, unspecified: Secondary | ICD-10-CM

## 2021-04-21 ENCOUNTER — Other Ambulatory Visit: Payer: Self-pay

## 2021-04-21 ENCOUNTER — Ambulatory Visit (INDEPENDENT_AMBULATORY_CARE_PROVIDER_SITE_OTHER): Payer: Medicare Other | Admitting: Nurse Practitioner

## 2021-04-21 ENCOUNTER — Encounter: Payer: Self-pay | Admitting: Podiatry

## 2021-04-21 ENCOUNTER — Ambulatory Visit (INDEPENDENT_AMBULATORY_CARE_PROVIDER_SITE_OTHER): Payer: Medicare Other | Admitting: Podiatry

## 2021-04-21 ENCOUNTER — Ambulatory Visit (INDEPENDENT_AMBULATORY_CARE_PROVIDER_SITE_OTHER): Payer: Medicare Other

## 2021-04-21 ENCOUNTER — Encounter (INDEPENDENT_AMBULATORY_CARE_PROVIDER_SITE_OTHER): Payer: Medicare Other

## 2021-04-21 DIAGNOSIS — E0843 Diabetes mellitus due to underlying condition with diabetic autonomic (poly)neuropathy: Secondary | ICD-10-CM

## 2021-04-21 DIAGNOSIS — L97422 Non-pressure chronic ulcer of left heel and midfoot with fat layer exposed: Secondary | ICD-10-CM | POA: Diagnosis not present

## 2021-04-21 NOTE — Telephone Encounter (Signed)
Requested Prescriptions  Pending Prescriptions Disp Refills   hydrOXYzine (ATARAX) 25 MG tablet [Pharmacy Med Name: HYDROXYZINE HCL 25 MG TABLET] 270 tablet 0    Sig: TAKE 1 TABLET BY MOUTH THREE TIMES A DAY AS NEEDED     Ear, Nose, and Throat:  Antihistamines 2 Failed - 04/20/2021  2:31 PM      Failed - Cr in normal range and within 360 days    Creat  Date Value Ref Range Status  12/16/2016 1.08 (H) 0.60 - 0.93 mg/dL Final    Comment:    For patients >36 years of age, the reference limit for Creatinine is approximately 13% higher for people identified as African-American. .    Creatinine, Ser  Date Value Ref Range Status  12/22/2020 1.22 (H) 0.44 - 1.00 mg/dL Final         Passed - Valid encounter within last 12 months    Recent Outpatient Visits          2 weeks ago Chronic heel pain, left   [facility] [name] T, FNP   1 month ago 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 edema, with long-term current use of insulin [facility])   [facility] [name], FNP   2 months ago Hospital discharge follow-up   [facility] [name] T, FNP   5 months ago 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 edema, with long-term current use of insulin [facility])   [facility] [name] T, FNP   10 months ago Localized swelling of left lower extremity   [facility] [place], Clearnce Sorrel, [place]      Future Appointments            In 1 week [place], Kathlene November, MD [facility], [facility]

## 2021-04-22 DIAGNOSIS — I2510 Atherosclerotic heart disease of native coronary artery without angina pectoris: Secondary | ICD-10-CM | POA: Diagnosis not present

## 2021-04-22 DIAGNOSIS — E1169 Type 2 diabetes mellitus with other specified complication: Secondary | ICD-10-CM | POA: Diagnosis not present

## 2021-04-22 DIAGNOSIS — Z7984 Long term (current) use of oral hypoglycemic drugs: Secondary | ICD-10-CM | POA: Diagnosis not present

## 2021-04-22 DIAGNOSIS — K589 Irritable bowel syndrome without diarrhea: Secondary | ICD-10-CM | POA: Diagnosis not present

## 2021-04-22 DIAGNOSIS — N3281 Overactive bladder: Secondary | ICD-10-CM | POA: Diagnosis not present

## 2021-04-22 DIAGNOSIS — I130 Hypertensive heart and chronic kidney disease with heart failure and stage 1 through stage 4 chronic kidney disease, or unspecified chronic kidney disease: Secondary | ICD-10-CM | POA: Diagnosis not present

## 2021-04-22 DIAGNOSIS — M810 Age-related osteoporosis without current pathological fracture: Secondary | ICD-10-CM | POA: Diagnosis not present

## 2021-04-22 DIAGNOSIS — M86172 Other acute osteomyelitis, left ankle and foot: Secondary | ICD-10-CM | POA: Diagnosis not present

## 2021-04-22 DIAGNOSIS — G4700 Insomnia, unspecified: Secondary | ICD-10-CM | POA: Diagnosis not present

## 2021-04-22 DIAGNOSIS — E11319 Type 2 diabetes mellitus with unspecified diabetic retinopathy without macular edema: Secondary | ICD-10-CM | POA: Diagnosis not present

## 2021-04-22 DIAGNOSIS — Z87440 Personal history of urinary (tract) infections: Secondary | ICD-10-CM | POA: Diagnosis not present

## 2021-04-22 DIAGNOSIS — L89620 Pressure ulcer of left heel, unstageable: Secondary | ICD-10-CM | POA: Diagnosis not present

## 2021-04-22 DIAGNOSIS — E1122 Type 2 diabetes mellitus with diabetic chronic kidney disease: Secondary | ICD-10-CM | POA: Diagnosis not present

## 2021-04-22 DIAGNOSIS — N189 Chronic kidney disease, unspecified: Secondary | ICD-10-CM | POA: Diagnosis not present

## 2021-04-22 DIAGNOSIS — E1140 Type 2 diabetes mellitus with diabetic neuropathy, unspecified: Secondary | ICD-10-CM | POA: Diagnosis not present

## 2021-04-22 DIAGNOSIS — K219 Gastro-esophageal reflux disease without esophagitis: Secondary | ICD-10-CM | POA: Diagnosis not present

## 2021-04-22 DIAGNOSIS — J449 Chronic obstructive pulmonary disease, unspecified: Secondary | ICD-10-CM | POA: Diagnosis not present

## 2021-04-22 DIAGNOSIS — Z8616 Personal history of COVID-19: Secondary | ICD-10-CM | POA: Diagnosis not present

## 2021-04-22 DIAGNOSIS — E785 Hyperlipidemia, unspecified: Secondary | ICD-10-CM | POA: Diagnosis not present

## 2021-04-22 DIAGNOSIS — M6281 Muscle weakness (generalized): Secondary | ICD-10-CM | POA: Diagnosis not present

## 2021-04-22 DIAGNOSIS — E1165 Type 2 diabetes mellitus with hyperglycemia: Secondary | ICD-10-CM | POA: Diagnosis not present

## 2021-04-22 DIAGNOSIS — I5042 Chronic combined systolic (congestive) and diastolic (congestive) heart failure: Secondary | ICD-10-CM | POA: Diagnosis not present

## 2021-04-22 DIAGNOSIS — M1990 Unspecified osteoarthritis, unspecified site: Secondary | ICD-10-CM | POA: Diagnosis not present

## 2021-04-23 ENCOUNTER — Other Ambulatory Visit: Payer: Self-pay | Admitting: Family Medicine

## 2021-04-23 ENCOUNTER — Telehealth: Payer: Self-pay | Admitting: Family Medicine

## 2021-04-23 MED ORDER — OXYCODONE HCL 5 MG PO TABS: 5.0000 mg | ORAL_TABLET | ORAL | 0 refills | Status: DC | PRN

## 2021-04-23 NOTE — Telephone Encounter (Addendum)
Medication Refill - Medication: traMADol HCl 100 MG TABS   Pt stated not a new symptom.  Has the patient contacted their pharmacy? No. No more refills.   (Agent: If no, request that the patient contact the pharmacy for the refill. If patient does not wish to contact the pharmacy document the reason why and proceed with request.)   Preferred Pharmacy (with phone number or street name):  [id number] [place] [place]  [place] [address]  Phone: [phone] Fax: [phone]  Hours: Not open 24 hours   Has the patient been seen for an appointment in the last year OR does the patient have an upcoming appointment? Yes.    Agent: Please be advised that RX refills may take up to 3 business days. We ask that you follow-up with your pharmacy.

## 2021-04-23 NOTE — Telephone Encounter (Signed)
Patient called and advised Tramadol is not on her current medication list. She says that [name] prescribed it for her and she's needing a refill. Last refill in chart 04/07/21, not on the updated medication list, will need adding for future refill requests.

## 2021-04-24 ENCOUNTER — Other Ambulatory Visit: Payer: Self-pay | Admitting: Family Medicine

## 2021-04-24 NOTE — Telephone Encounter (Signed)
Medication Refill - Medication: potassium chloride (KLOR-CON) 10 MEQ tablet  Has the patient contacted their pharmacy? No. Pt stated she mentioned to the nurse she spoke with earlier today requesting refill, but the medications were not ready when she picked them up. Pharmacy advised to contact PCP.   (Agent: If yes, when and what did the pharmacy advise?)  Preferred Pharmacy (with phone number or street name):  [id number] [place] [place]  [address] [place] [address]  Phone: [phone] Fax: [phone]  Hours: Not open 24 hours   Has the patient been seen for an appointment in the last year OR does the patient have an upcoming appointment? Yes.    Agent: Please be advised that RX refills may take up to 3 business days. We ask that you follow-up with your pharmacy.

## 2021-04-24 NOTE — Telephone Encounter (Signed)
Patient advised.

## 2021-04-25 NOTE — Telephone Encounter (Signed)
Requested medication (s) are due for refill today: unsure  Requested medication (s) are on the active medication list: yes  Last refill:  unknown  Future visit scheduled: no  Notes to clinic:  Historical Provider, please assess.      Requested Prescriptions  Pending Prescriptions Disp Refills   potassium chloride (KLOR-CON) 10 MEQ tablet      Sig: Take 1 tablet (10 mEq total) by mouth daily.     Endocrinology:  Minerals - Potassium Supplementation Failed - 04/24/2021  4:44 PM      Failed - Cr in normal range and within 360 days    Creat  Date Value Ref Range Status  12/16/2016 1.08 (H) 0.60 - 0.93 mg/dL Final    Comment:    For patients >69 years of age, the reference limit for Creatinine is approximately 13% higher for people identified as African-American. .    Creatinine, Ser  Date Value Ref Range Status  12/22/2020 1.22 (H) 0.44 - 1.00 mg/dL Final          Passed - K in normal range and within 360 days    Potassium  Date Value Ref Range Status  12/22/2020 3.7 3.5 - 5.1 mmol/L Final  01/01/2014 4.2 3.5 - 5.1 mmol/L Final          Passed - Valid encounter within last 12 months    Recent Outpatient Visits           3 weeks ago Chronic heel pain, left   [facility] [name] T, FNP   2 months ago 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 edema, with long-term current use of insulin [facility])   [facility] [name], FNP   3 months ago Hospital discharge follow-up   [facility] [name] T, FNP   5 months ago 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 edema, with long-term current use of insulin [facility])   [facility] [name] T, FNP   10 months ago Localized swelling of left lower extremity   [place], [place]       Future Appointments              In 3 days [place], Kathlene November, MD [facility], [facility]

## 2021-04-27 NOTE — Progress Notes (Signed)
Date:  04/28/2021   ID:  Andrea [name], DOB 09-12-1945, MRN [id number]  Patient Location:  [place] [id number]   Provider location:   [facility], [place] office  PCP:  [name], FNP  Cardiologist:  [employer] [employer]   Chief Complaint  Patient presents with   12 month follow up    Patient c/o shortness of breath with little activity. Medications reviewed by the patient verbally.      History of Present Illness:    Andrea [name] is a 76 y.o. female  past medical history of obesity,  Diabetes type II, Prior stroke/TIA Prior smoking, stopped 4 to 5 yrs ago hyperlipidemia,  right bundle branch block,  CAD myocardial infarction (in 1994-95) , mid anterior wall to apical region, anteroseptal, apical and apical inferior region systolic heart failure with ejection fraction of 45-50% in May 2014 Normal ejection fraction March 2018 Echo 2018: EF 55% Who presents for follow-up of her coronary artery disease, history of previous MI  Last office visit aug 2021 In good spirits today, Long discussion concerning complications after hip surgery Denies chest pain or shortness of breath concerning for angina  Lab work reviewed A1C 7.5 down from 8.1  Underwent hip surgery Complications from surgery developed left heel osteomyelitis with Pseudomonas Acute kidney injury creatinine 2.9 with improvement Foot remains in a boot Has had numerous courses of antibiotics, followed by infectious disease  Echo 04/07/2021  1. Left ventricular ejection fraction, by estimation, is 40 to 45%. The  left ventricle has mildly decreased function. The left ventricle  demonstrates global hypokinesis. Unable to exclude regional wall motion  abnormality (challenging images). Left  ventricular diastolic parameters are indeterminate.   2. Right ventricular systolic function is normal. The right ventricular  size is normal.   3. The mitral valve was not  well visualized. No evidence of mitral valve  regurgitation. No evidence of mitral stenosis. Moderate mitral annular  calcification.   4. The aortic valve was not well visualized. Aortic valve regurgitation  is not visualized. No aortic stenosis is present.   vascular 03/22/2019.   Had lower extremity arterial Dopplers  Bilateral lower extremity duplex showing biphasic waveforms.  RLE had biphasic/triphasic waveforms.  Noted mild PAD  wheelchair bound since her hip surgery (prior fall leading to hip fracture ) having problems with the hardware per the patient, needs to have left hip surgery in the near future  Has a walker, presents today in a wheelchair No chest pain, no SOB Does not feel she is having any angina No edema  Prior stress test 2018 No ischemia  Echocardiogram 2018, normal ejection fraction 55 to 60%  Hemoglobin A1c has improved over the last several years  EKG personally reviewed by myself on todays visit NSR, old anterior mi rate 97 bpm LAD  Previous lab work  Total cholesterol 136, LDL 43    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   Depression    Diabetes mellitus type 2, uncomplicated ([place])    Diabetic retinopathy ([place])    Dupuytren contracture 2011   s/p surgery to LEFT hand  Frequent urinary tract infections    GERD (gastroesophageal reflux disease)    Grade I diastolic dysfunction    HTN (hypertension)    Hyperlipidemia, unspecified    Irritable bowel syndrome    Meningitis 11/2019   MI (myocardial infarction) ([place]) 1994   no stent   MRSA (methicillin resistant staph aureus) culture positive    Neuromuscular disorder ([place])    nerve pain in back and legs   Osteoporosis    Pneumonia 2021   RBBB     Sepsis ([place]) 11/2019   Skin cancer    TIA (transient ischemic attack) 2014   Vitamin D deficiency, unspecified    Wheezing    Past Surgical History:  Procedure Laterality Date   BACK SURGERY     lower due to spinal stenosis   BONE BIOPSY Left 11/20/2020   Procedure: BONE BIOPSY;  Surgeon: Edrick Kins, DPM;  Location: [facility] ORS;  Service: Podiatry;  Laterality: Left;   CARDIAC CATHETERIZATION  1994   CATARACT EXTRACTION, BILATERAL Bilateral    COLONOSCOPY     x3   COLONOSCOPY WITH PROPOFOL N/A 02/08/2019   Procedure: COLONOSCOPY WITH PROPOFOL;  Surgeon: [name], MD;  Location: [facility] ENDOSCOPY;  Service: Gastroenterology;  Laterality: N/A;   CORONARY ANGIOPLASTY  1994   DUPUYTREN CONTRACTURE RELEASE Left 2011   HARDWARE REMOVAL Left 08/19/2020   Procedure: HARDWARE REMOVAL;  Surgeon: Hessie Knows, MD;  Location: [facility] ORS;  Service: Orthopedics;  Laterality: Left;   HIP FRACTURE SURGERY  11/2013   IRRIGATION AND DEBRIDEMENT FOOT Left 11/20/2020   Procedure: IRRIGATION AND DEBRIDEMENT FOOT-Heel Ulcer;  Surgeon: Edrick Kins, DPM;  Location: [facility] ORS;  Service: Podiatry;  Laterality: Left;   PR COLSC FLX W/REMOVAL LESION BY HOT BX FORCEPS   08/21/2015   Procedure: COLONOSCOPY, FLEXIBLE, PROXIMAL TO SPLENIC FLEXURE; W/REMOVAL TUMOR/POLYP/OTHER LESION, HOT BX FORCEP/CAUTE; Surgeon: Carlena Hurl, MD; Location: [facility]; Service: General Surgery   REPAIR DURAL / CSF LEAK     to left sinus   TEE WITHOUT CARDIOVERSION N/A 11/20/2019   Procedure: TRANSESOPHAGEAL ECHOCARDIOGRAM (TEE);  Surgeon: Nelva Bush, MD;  Location: [facility] ORS;  Service: Cardiovascular;  Laterality: N/A;   TOTAL HIP ARTHROPLASTY Left 08/19/2020   Procedure: TOTAL HIP ARTHROPLASTY ANTERIOR APPROACH;  Surgeon: Hessie Knows, MD;  Location: [facility] ORS;  Service: Orthopedics;  Laterality: Left;     Current Meds  Medication Sig   acetaminophen (TYLENOL) 325 MG tablet Take 2 tablets (650 mg total) by mouth  every 6 (six) hours as needed for mild pain (or Fever >/= 101).   Alcohol Swabs (ALCOHOL PREP) PADS Use twice daily prior to SQ injection of insulin to clean skin   atorvastatin (LIPITOR) 20 MG tablet Take 1 tablet (20 mg total) by mouth at bedtime.   Calcium Carbonate-Vitamin D 600-400 MG-UNIT tablet Take 1 tablet by mouth 2 (two) times daily.   Cyanocobalamin (B-12) 1000 MCG CAPS Take 1,000 mcg by mouth daily.    furosemide (LASIX) 20 MG tablet Take 20 mg by mouth daily.   hydrOXYzine (ATARAX) 25 MG tablet TAKE 1 TABLET BY MOUTH THREE TIMES A DAY AS NEEDED   Insulin Pen Needle (TRUEPLUS PEN NEEDLES) 31G X 6 MM MISC Use with lantus two times daily.   INSULIN SYRINGE 1CC/29G 29G X 1/2" 1 ML MISC For lantus injections twice daily   LANTUS SOLOSTAR 100 UNIT/ML Solostar Pen Inject 20 Units into the skin at bedtime.   losartan (COZAAR) 25 MG tablet TAKE 1 TABLET  BY MOUTH EVERY DAY   metoprolol succinate (TOPROL-XL) 25 MG 24 hr tablet TAKE 1 TABLET BY MOUTH EVERY DAY   Multiple Vitamins-Minerals (PRESERVISION AREDS 2 PO) Take 1 tablet by mouth in the morning and at bedtime.   Omega-3 1000 MG CAPS Take 1,000 mg by mouth daily.   omeprazole (PRILOSEC) 40 MG capsule TAKE 1 CAPSULE BY MOUTH EVERY DAY   OneTouch Delica Lancets 40C MISC To check blood sugar daily  DX: E11.9   ONETOUCH VERIO test strip TO CHECK BLOOD SUGAR ONCE DAILY.   oxybutynin (DITROPAN-XL) 10 MG 24 hr tablet TAKE 1 TABLET BY MOUTH EVERYDAY AT BEDTIME   oxyCODONE (OXY IR/ROXICODONE) 5 MG immediate release tablet Take 1 tablet (5 mg total) by mouth every 4 (four) hours as needed for severe pain.   potassium chloride (KLOR-CON) 10 MEQ tablet TAKE 1 TABLET BY MOUTH EVERY DAY   sertraline (ZOLOFT) 25 MG tablet Take 1 tablet (25 mg total) by mouth daily.   traZODone (DESYREL) 100 MG tablet Take 0.5 tablets (50 mg total) by mouth at bedtime.   TRULICITY 1.5 XK/4.8JE SOPN INJECT 1.5MG  INTO THE SKIN ONCE A WEEK AS DIRECTED   vitamin C  (ASCORBIC ACID) 500 MG tablet Take 500 mg by mouth 2 (two) times daily.   Vitamin D, Ergocalciferol, (DRISDOL) 1.25 MG (50000 UNIT) CAPS capsule TAKE ONE CAPSULE BY MOUTH ONCE WEEKLY ON THE SAME DAY EACH WEEK (ON FRIDAYS) (Patient taking differently: Take 50,000 Units by mouth every 7 (seven) days.)     Allergies:   Chlorhexidine, Metformin and related, and Sulfa antibiotics   Social History   Tobacco Use   Smoking status: Some Days    Packs/day: 1.00    Years: 30.00    Pack years: 30.00    Types: Cigarettes    Last attempt to quit: 07/17/2012    Years since quitting: 8.7   Smokeless tobacco: Never   Tobacco comments:       Vaping Use   Vaping Use: Never used  Substance Use Topics   Alcohol use: Not Currently    Alcohol/week: 0.0 standard drinks   Drug use: No      Family Hx: The patient's family history includes AAA (abdominal aortic aneurysm) in her daughter; Anxiety disorder in her daughter; Arthritis in her daughter; Asthma in her brother; Breast cancer in her cousin; Cataracts in her mother; Depression in her daughter and mother; Diabetes in her daughter; Heart disease in her mother; Hyperlipidemia in her daughter and daughter; Hypertension in her daughter and mother; Plantar fasciitis in her daughter; Stroke in her mother.  ROS:   Please see the history of present illness.    Review of Systems  Constitutional: Negative.   HENT: Negative.    Respiratory: Negative.    Cardiovascular: Negative.   Gastrointestinal: Negative.   Musculoskeletal: Negative.        Gait instability  Neurological: Negative.   Psychiatric/Behavioral: Negative.    All other systems reviewed and are negative.   Labs/Other Tests and Data Reviewed:    Recent Labs: 12/18/2020: ALT 16 12/20/2020: Hemoglobin 10.5; Platelets 211 12/21/2020: Magnesium 1.8 12/22/2020: BUN 26; Creatinine, Ser 1.22; Potassium 3.7; Sodium 142   Recent Lipid Panel Lab Results  Component Value Date/Time   CHOL 115  02/11/2020 02:41 PM   TRIG 105 02/11/2020 02:41 PM   HDL 67 02/11/2020 02:41 PM   CHOLHDL 1.7 02/11/2020 02:41 PM   CHOLHDL 2.1 12/16/2016 03:39 PM   LDLCALC 29 02/11/2020 02:41 PM  LDLCALC 56 12/16/2016 03:39 PM    Wt Readings from Last 3 Encounters:  04/28/21 135 lb 6 oz (61.4 kg)  02/24/21 135 lb (61.2 kg)  01/27/21 121 lb 1.6 oz (54.9 kg)     Exam:    Vital Signs: Vital signs may also be detailed in the HPI BP (!) 120/50 (BP Location: Left Arm, Patient Position: Sitting, Cuff Size: Normal)    Pulse 97    Ht 5' (1.524 m)    Wt 135 lb 6 oz (61.4 kg)    SpO2 97%    BMI 26.44 kg/m   Constitutional:  oriented to person, place, and time. No distress.  HENT:  Head: Grossly normal Eyes:  no discharge. No scleral icterus.  Neck: No JVD, no carotid bruits  Cardiovascular: Regular rate and rhythm, no murmurs appreciated Pulmonary/Chest: Clear to auscultation bilaterally, no wheezes or rails Abdominal: Soft.  no distension.  no tenderness.  Musculoskeletal: Normal range of motion Neurological:  normal muscle tone. Coordination normal. No atrophy Skin: Skin warm and dry Psychiatric: normal affect, pleasant   ASSESSMENT & PLAN:    Problem List Items Addressed This Visit       Cardiology Problems   Essential hypertension   PAD (peripheral artery disease) ([place]) - Primary   Relevant Orders   EKG 12-Lead   Other Visit Diagnoses     Chronic systolic heart failure (HCC)       Relevant Orders   EKG 12-Lead   HFrEF (heart failure with reduced ejection fraction) ([place])       Relevant Orders   EKG 12-Lead   Poorly controlled type 2 diabetes mellitus ([place])       Hyperlipidemia LDL goal <70           HTN: Blood pressure is well controlled on today's visit. No changes made to the medications.  Cad stable angina Prior stress test with no ischemia, no changes clinically since that time Stressed importance that she continue to work on her diabetes  Diabetes, HBA1C 7.5 Unable  to exercise for weight loss, recommended strict diet Recommend she work closely with primary care  Osteomyelitis Long hospital course, discussed recent hospital admission, long courses of antibiotics Wearing a boot  Hyperlipidemia Lipids at goal    Total encounter time more than 30 minutes  Greater than 50% was spent in counseling and coordination of care with the patient   Signed, Ida Rogue, [place] [facility] [id number], [place], [place] [id number]

## 2021-04-28 ENCOUNTER — Ambulatory Visit: Payer: Medicare Other | Admitting: Cardiovascular Disease

## 2021-04-28 ENCOUNTER — Encounter: Payer: Self-pay | Admitting: Cardiovascular Disease

## 2021-04-28 ENCOUNTER — Other Ambulatory Visit: Payer: Self-pay | Admitting: Family Medicine

## 2021-04-28 ENCOUNTER — Other Ambulatory Visit: Payer: Self-pay

## 2021-04-28 VITALS — BP 120/50 | HR 97 | Ht 60.0 in | Wt 135.4 lb

## 2021-04-28 DIAGNOSIS — E1165 Type 2 diabetes mellitus with hyperglycemia: Secondary | ICD-10-CM | POA: Diagnosis not present

## 2021-04-28 DIAGNOSIS — I5022 Chronic systolic (congestive) heart failure: Secondary | ICD-10-CM | POA: Diagnosis not present

## 2021-04-28 DIAGNOSIS — I5020 Unspecified systolic (congestive) heart failure: Secondary | ICD-10-CM | POA: Diagnosis not present

## 2021-04-28 DIAGNOSIS — I739 Peripheral vascular disease, unspecified: Secondary | ICD-10-CM

## 2021-04-28 DIAGNOSIS — I10 Essential (primary) hypertension: Secondary | ICD-10-CM

## 2021-04-28 DIAGNOSIS — E785 Hyperlipidemia, unspecified: Secondary | ICD-10-CM | POA: Insufficient documentation

## 2021-04-28 MED ORDER — ATORVASTATIN CALCIUM 20 MG PO TABS: 20.0000 mg | ORAL_TABLET | Freq: Every day | ORAL | 1 refills | Status: DC

## 2021-04-28 MED ORDER — METOPROLOL SUCCINATE ER 25 MG PO TB24: 25.0000 mg | ORAL_TABLET | Freq: Two times a day (BID) | ORAL | 3 refills | Status: DC

## 2021-04-28 NOTE — Progress Notes (Signed)
Subjective:  Patient presents today status post partial posterior calcanectomy with placement of antibiotic beads and application of wound graft left.  DOS: 11/20/2020.  Patient states that she is doing well.  She has been applying collagen with a light dressing to the wound.  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   Depression    Diabetes mellitus type 2, uncomplicated ([place])    Diabetic retinopathy ([place])    Dupuytren contracture 2011   s/p surgery to LEFT hand   Frequent urinary tract infections    GERD (gastroesophageal reflux disease)    Grade I diastolic dysfunction    HTN (hypertension)    Hyperlipidemia, unspecified    Irritable bowel syndrome    Meningitis 11/2019   MI (myocardial infarction) ([place]) 1994   no stent   MRSA (methicillin resistant staph aureus) culture positive    Neuromuscular disorder ([place])    nerve pain in back and legs   Osteoporosis    Pneumonia 2021   RBBB    Sepsis ([place]) 11/2019   Skin cancer    TIA (transient ischemic attack) 2014   Vitamin D deficiency, unspecified    Wheezing    Past Surgical History:  Procedure Laterality Date   BACK SURGERY     lower due to spinal stenosis   BONE BIOPSY Left 11/20/2020   Procedure: BONE BIOPSY;  Surgeon: Edrick Kins, DPM;  Location: [facility] ORS;  Service: Podiatry;  Laterality: Left;   CARDIAC CATHETERIZATION  1994   CATARACT EXTRACTION, BILATERAL Bilateral    COLONOSCOPY     x3   COLONOSCOPY WITH PROPOFOL N/A 02/08/2019   Procedure: COLONOSCOPY WITH PROPOFOL;  Surgeon: [name], MD;  Location: [facility] ENDOSCOPY;  Service: Gastroenterology;  Laterality: N/A;   CORONARY ANGIOPLASTY  1994   DUPUYTREN CONTRACTURE RELEASE Left 2011    HARDWARE REMOVAL Left 08/19/2020   Procedure: HARDWARE REMOVAL;  Surgeon: Hessie Knows, MD;  Location: [facility] ORS;  Service: Orthopedics;  Laterality: Left;   HIP FRACTURE SURGERY  11/2013   IRRIGATION AND DEBRIDEMENT FOOT Left 11/20/2020   Procedure: IRRIGATION AND DEBRIDEMENT FOOT-Heel Ulcer;  Surgeon: Edrick Kins, DPM;  Location: [facility] ORS;  Service: Podiatry;  Laterality: Left;   PR COLSC FLX W/REMOVAL LESION BY HOT BX FORCEPS   08/21/2015   Procedure: COLONOSCOPY, FLEXIBLE, PROXIMAL TO SPLENIC FLEXURE; W/REMOVAL TUMOR/POLYP/OTHER LESION, HOT BX FORCEP/CAUTE; Surgeon: Carlena Hurl, MD; Location: OR [facility]; Service: General Surgery   REPAIR DURAL / CSF LEAK     to left sinus   TEE WITHOUT CARDIOVERSION N/A 11/20/2019   Procedure: TRANSESOPHAGEAL ECHOCARDIOGRAM (TEE);  Surgeon: Nelva Bush, MD;  Location: [facility] ORS;  Service: Cardiovascular;  Laterality: N/A;   TOTAL HIP ARTHROPLASTY Left 08/19/2020   Procedure: TOTAL HIP ARTHROPLASTY ANTERIOR APPROACH;  Surgeon: Hessie Knows, MD;  Location: [facility] ORS;  Service: Orthopedics;  Laterality: Left;   Allergies  Allergen Reactions   Chlorhexidine    Metformin And Related Diarrhea   Sulfa Antibiotics Rash        Objective/Physical Exam Neurovascular status intact.  No edema noted.  No erythema.  There continues to be slight serous drainage to  the posterior heel wound which measures approximately 1.3 x 0.5 x 0.3 cm.  There is improvement since last month.  Wound base is mostly fibrotic however.  Periwound is intact.  Radiographic exam 03/24/2021 LT foot: Osseous remodeling noted to the posterior tubercle of the calcaneus.  Radiographically it is hard to determine remodeling versus residual chronic osteomyelitis.  Recommend continued observation and routine serial x-rays  Assessment: 1. s/p partial calcanectomy with placement of antibiotic beads and application of wound VAC left.. DOS: 11/20/2020 2.  Ulcer left posterior heel  secondary to diabetes mellitus 3. H/O osteomyelitis LT calcaneus   Plan of Care:  1. Patient was evaluated.  X-rays reviewed 2.  Medically necessary excisional debridement including subcutaneous tissue and deep fascial tissue was performed using a tissue nipper.  Excisional debridement of all necrotic nonviable tissue down to healthier bleeding viable tissue was performed with postdebridement measurement same as pre- 3.  Continue collagen Prisma and dry sterile dressing daily 4.  Continue weightbearing in postsurgical shoe 5.  Patient has completed the oral antibiotics from infectious disease and infectious disease has now signed off 6.  Return to clinic in 4 weeks   Edrick Kins, DPM [facility]  Dr. Edrick Kins, DPM    [address], [place] [address]                Office [phone]  Fax [phone]

## 2021-04-28 NOTE — Patient Instructions (Addendum)
Medication Instructions:  Please increase the metoprolol succinate up to 25 twice a day  If you need a refill on your cardiac medications before your next appointment, please call your pharmacy.   Lab work: No new labs needed  Testing/Procedures: No new testing needed  Follow-Up: At [facility], you and your health needs are our priority.  As part of our continuing mission to provide you with exceptional heart care, we have created designated Provider Care Teams.  These Care Teams include your primary Cardiologist (physician) and Advanced Practice Providers (APPs -  Physician Assistants and Nurse Practitioners) who all work together to provide you with the care you need, when you need it.  You will need a follow up appointment in 12 months  Providers on your designated Care Team:   Murray Hodgkins, NP Christell Faith, PA-C Cadence Kathlen Mody, [place]  COVID-19 Vaccine Information can be found at: [email or web address] For questions related to vaccine distribution or appointments, please email [email or web address] or call 9384163716.

## 2021-04-29 DIAGNOSIS — Z87440 Personal history of urinary (tract) infections: Secondary | ICD-10-CM | POA: Diagnosis not present

## 2021-04-29 DIAGNOSIS — E1140 Type 2 diabetes mellitus with diabetic neuropathy, unspecified: Secondary | ICD-10-CM | POA: Diagnosis not present

## 2021-04-29 DIAGNOSIS — M810 Age-related osteoporosis without current pathological fracture: Secondary | ICD-10-CM | POA: Diagnosis not present

## 2021-04-29 DIAGNOSIS — J449 Chronic obstructive pulmonary disease, unspecified: Secondary | ICD-10-CM | POA: Diagnosis not present

## 2021-04-29 DIAGNOSIS — I130 Hypertensive heart and chronic kidney disease with heart failure and stage 1 through stage 4 chronic kidney disease, or unspecified chronic kidney disease: Secondary | ICD-10-CM | POA: Diagnosis not present

## 2021-04-29 DIAGNOSIS — M86172 Other acute osteomyelitis, left ankle and foot: Secondary | ICD-10-CM | POA: Diagnosis not present

## 2021-04-29 DIAGNOSIS — E11319 Type 2 diabetes mellitus with unspecified diabetic retinopathy without macular edema: Secondary | ICD-10-CM | POA: Diagnosis not present

## 2021-04-29 DIAGNOSIS — E785 Hyperlipidemia, unspecified: Secondary | ICD-10-CM | POA: Diagnosis not present

## 2021-04-29 DIAGNOSIS — I2510 Atherosclerotic heart disease of native coronary artery without angina pectoris: Secondary | ICD-10-CM | POA: Diagnosis not present

## 2021-04-29 DIAGNOSIS — I5042 Chronic combined systolic (congestive) and diastolic (congestive) heart failure: Secondary | ICD-10-CM | POA: Diagnosis not present

## 2021-04-29 DIAGNOSIS — E1169 Type 2 diabetes mellitus with other specified complication: Secondary | ICD-10-CM | POA: Diagnosis not present

## 2021-04-29 DIAGNOSIS — E1165 Type 2 diabetes mellitus with hyperglycemia: Secondary | ICD-10-CM | POA: Diagnosis not present

## 2021-04-29 DIAGNOSIS — Z7984 Long term (current) use of oral hypoglycemic drugs: Secondary | ICD-10-CM | POA: Diagnosis not present

## 2021-04-29 DIAGNOSIS — K219 Gastro-esophageal reflux disease without esophagitis: Secondary | ICD-10-CM | POA: Diagnosis not present

## 2021-04-29 DIAGNOSIS — E1122 Type 2 diabetes mellitus with diabetic chronic kidney disease: Secondary | ICD-10-CM | POA: Diagnosis not present

## 2021-04-29 DIAGNOSIS — N3281 Overactive bladder: Secondary | ICD-10-CM | POA: Diagnosis not present

## 2021-04-29 DIAGNOSIS — G4700 Insomnia, unspecified: Secondary | ICD-10-CM | POA: Diagnosis not present

## 2021-04-29 DIAGNOSIS — K589 Irritable bowel syndrome without diarrhea: Secondary | ICD-10-CM | POA: Diagnosis not present

## 2021-04-29 DIAGNOSIS — M6281 Muscle weakness (generalized): Secondary | ICD-10-CM | POA: Diagnosis not present

## 2021-04-29 DIAGNOSIS — M1990 Unspecified osteoarthritis, unspecified site: Secondary | ICD-10-CM | POA: Diagnosis not present

## 2021-04-29 DIAGNOSIS — N189 Chronic kidney disease, unspecified: Secondary | ICD-10-CM | POA: Diagnosis not present

## 2021-04-29 DIAGNOSIS — L89620 Pressure ulcer of left heel, unstageable: Secondary | ICD-10-CM | POA: Diagnosis not present

## 2021-04-29 DIAGNOSIS — Z8616 Personal history of COVID-19: Secondary | ICD-10-CM | POA: Diagnosis not present

## 2021-05-06 DIAGNOSIS — G4700 Insomnia, unspecified: Secondary | ICD-10-CM | POA: Diagnosis not present

## 2021-05-06 DIAGNOSIS — N189 Chronic kidney disease, unspecified: Secondary | ICD-10-CM | POA: Diagnosis not present

## 2021-05-06 DIAGNOSIS — N3281 Overactive bladder: Secondary | ICD-10-CM | POA: Diagnosis not present

## 2021-05-06 DIAGNOSIS — E785 Hyperlipidemia, unspecified: Secondary | ICD-10-CM | POA: Diagnosis not present

## 2021-05-06 DIAGNOSIS — I5042 Chronic combined systolic (congestive) and diastolic (congestive) heart failure: Secondary | ICD-10-CM | POA: Diagnosis not present

## 2021-05-06 DIAGNOSIS — E1140 Type 2 diabetes mellitus with diabetic neuropathy, unspecified: Secondary | ICD-10-CM | POA: Diagnosis not present

## 2021-05-06 DIAGNOSIS — K219 Gastro-esophageal reflux disease without esophagitis: Secondary | ICD-10-CM | POA: Diagnosis not present

## 2021-05-06 DIAGNOSIS — I130 Hypertensive heart and chronic kidney disease with heart failure and stage 1 through stage 4 chronic kidney disease, or unspecified chronic kidney disease: Secondary | ICD-10-CM | POA: Diagnosis not present

## 2021-05-06 DIAGNOSIS — Z7984 Long term (current) use of oral hypoglycemic drugs: Secondary | ICD-10-CM | POA: Diagnosis not present

## 2021-05-06 DIAGNOSIS — M810 Age-related osteoporosis without current pathological fracture: Secondary | ICD-10-CM | POA: Diagnosis not present

## 2021-05-06 DIAGNOSIS — E1169 Type 2 diabetes mellitus with other specified complication: Secondary | ICD-10-CM | POA: Diagnosis not present

## 2021-05-06 DIAGNOSIS — L89620 Pressure ulcer of left heel, unstageable: Secondary | ICD-10-CM | POA: Diagnosis not present

## 2021-05-06 DIAGNOSIS — J449 Chronic obstructive pulmonary disease, unspecified: Secondary | ICD-10-CM | POA: Diagnosis not present

## 2021-05-06 DIAGNOSIS — E11319 Type 2 diabetes mellitus with unspecified diabetic retinopathy without macular edema: Secondary | ICD-10-CM | POA: Diagnosis not present

## 2021-05-06 DIAGNOSIS — I2510 Atherosclerotic heart disease of native coronary artery without angina pectoris: Secondary | ICD-10-CM | POA: Diagnosis not present

## 2021-05-06 DIAGNOSIS — E1122 Type 2 diabetes mellitus with diabetic chronic kidney disease: Secondary | ICD-10-CM | POA: Diagnosis not present

## 2021-05-06 DIAGNOSIS — Z87440 Personal history of urinary (tract) infections: Secondary | ICD-10-CM | POA: Diagnosis not present

## 2021-05-06 DIAGNOSIS — M86172 Other acute osteomyelitis, left ankle and foot: Secondary | ICD-10-CM | POA: Diagnosis not present

## 2021-05-06 DIAGNOSIS — E1165 Type 2 diabetes mellitus with hyperglycemia: Secondary | ICD-10-CM | POA: Diagnosis not present

## 2021-05-06 DIAGNOSIS — M1990 Unspecified osteoarthritis, unspecified site: Secondary | ICD-10-CM | POA: Diagnosis not present

## 2021-05-06 DIAGNOSIS — Z8616 Personal history of COVID-19: Secondary | ICD-10-CM | POA: Diagnosis not present

## 2021-05-06 DIAGNOSIS — M6281 Muscle weakness (generalized): Secondary | ICD-10-CM | POA: Diagnosis not present

## 2021-05-06 DIAGNOSIS — K589 Irritable bowel syndrome without diarrhea: Secondary | ICD-10-CM | POA: Diagnosis not present

## 2021-05-07 ENCOUNTER — Encounter: Payer: Self-pay | Admitting: Family Medicine

## 2021-05-11 DIAGNOSIS — Z8616 Personal history of COVID-19: Secondary | ICD-10-CM | POA: Diagnosis not present

## 2021-05-11 DIAGNOSIS — E785 Hyperlipidemia, unspecified: Secondary | ICD-10-CM | POA: Diagnosis not present

## 2021-05-11 DIAGNOSIS — M810 Age-related osteoporosis without current pathological fracture: Secondary | ICD-10-CM | POA: Diagnosis not present

## 2021-05-11 DIAGNOSIS — E1169 Type 2 diabetes mellitus with other specified complication: Secondary | ICD-10-CM | POA: Diagnosis not present

## 2021-05-11 DIAGNOSIS — N3281 Overactive bladder: Secondary | ICD-10-CM | POA: Diagnosis not present

## 2021-05-11 DIAGNOSIS — K589 Irritable bowel syndrome without diarrhea: Secondary | ICD-10-CM | POA: Diagnosis not present

## 2021-05-11 DIAGNOSIS — I2510 Atherosclerotic heart disease of native coronary artery without angina pectoris: Secondary | ICD-10-CM | POA: Diagnosis not present

## 2021-05-11 DIAGNOSIS — L89620 Pressure ulcer of left heel, unstageable: Secondary | ICD-10-CM | POA: Diagnosis not present

## 2021-05-11 DIAGNOSIS — N189 Chronic kidney disease, unspecified: Secondary | ICD-10-CM | POA: Diagnosis not present

## 2021-05-11 DIAGNOSIS — I130 Hypertensive heart and chronic kidney disease with heart failure and stage 1 through stage 4 chronic kidney disease, or unspecified chronic kidney disease: Secondary | ICD-10-CM | POA: Diagnosis not present

## 2021-05-11 DIAGNOSIS — E11319 Type 2 diabetes mellitus with unspecified diabetic retinopathy without macular edema: Secondary | ICD-10-CM | POA: Diagnosis not present

## 2021-05-11 DIAGNOSIS — M1990 Unspecified osteoarthritis, unspecified site: Secondary | ICD-10-CM | POA: Diagnosis not present

## 2021-05-11 DIAGNOSIS — J449 Chronic obstructive pulmonary disease, unspecified: Secondary | ICD-10-CM | POA: Diagnosis not present

## 2021-05-11 DIAGNOSIS — M86172 Other acute osteomyelitis, left ankle and foot: Secondary | ICD-10-CM | POA: Diagnosis not present

## 2021-05-11 DIAGNOSIS — I5042 Chronic combined systolic (congestive) and diastolic (congestive) heart failure: Secondary | ICD-10-CM | POA: Diagnosis not present

## 2021-05-11 DIAGNOSIS — Z7984 Long term (current) use of oral hypoglycemic drugs: Secondary | ICD-10-CM | POA: Diagnosis not present

## 2021-05-11 DIAGNOSIS — E1165 Type 2 diabetes mellitus with hyperglycemia: Secondary | ICD-10-CM | POA: Diagnosis not present

## 2021-05-11 DIAGNOSIS — E1122 Type 2 diabetes mellitus with diabetic chronic kidney disease: Secondary | ICD-10-CM | POA: Diagnosis not present

## 2021-05-11 DIAGNOSIS — M6281 Muscle weakness (generalized): Secondary | ICD-10-CM | POA: Diagnosis not present

## 2021-05-11 DIAGNOSIS — K219 Gastro-esophageal reflux disease without esophagitis: Secondary | ICD-10-CM | POA: Diagnosis not present

## 2021-05-11 DIAGNOSIS — Z87440 Personal history of urinary (tract) infections: Secondary | ICD-10-CM | POA: Diagnosis not present

## 2021-05-11 DIAGNOSIS — G4700 Insomnia, unspecified: Secondary | ICD-10-CM | POA: Diagnosis not present

## 2021-05-11 DIAGNOSIS — E1140 Type 2 diabetes mellitus with diabetic neuropathy, unspecified: Secondary | ICD-10-CM | POA: Diagnosis not present

## 2021-05-14 ENCOUNTER — Telehealth: Payer: Self-pay

## 2021-05-14 NOTE — Telephone Encounter (Signed)
?  Care Management  ? ?Follow Up Note ? ? ?05/14/2021 ?Name: Andrea [name] MRN: [id number] DOB: 1945/07/18 ? ? ?Primary Care Provider: [name], FNP ?Reason for referral : Chronic Care Management ? ? ?An unsuccessful telephone outreach was attempted today. The patient was referred to the case management team for assistance with care management and care coordination.  ? ?Follow Up Plan:  ?A HIPAA compliant voice message was left today requesting a return call. ? ? ?Keanthony Poole,RN ?[place] ?[place] ?([phone] ? ?

## 2021-05-19 ENCOUNTER — Ambulatory Visit: Payer: Medicare Other | Admitting: Podiatry

## 2021-05-19 DIAGNOSIS — M86172 Other acute osteomyelitis, left ankle and foot: Secondary | ICD-10-CM | POA: Diagnosis not present

## 2021-05-19 DIAGNOSIS — N189 Chronic kidney disease, unspecified: Secondary | ICD-10-CM | POA: Diagnosis not present

## 2021-05-19 DIAGNOSIS — N3281 Overactive bladder: Secondary | ICD-10-CM | POA: Diagnosis not present

## 2021-05-19 DIAGNOSIS — Z7984 Long term (current) use of oral hypoglycemic drugs: Secondary | ICD-10-CM | POA: Diagnosis not present

## 2021-05-19 DIAGNOSIS — I2510 Atherosclerotic heart disease of native coronary artery without angina pectoris: Secondary | ICD-10-CM | POA: Diagnosis not present

## 2021-05-19 DIAGNOSIS — J449 Chronic obstructive pulmonary disease, unspecified: Secondary | ICD-10-CM | POA: Diagnosis not present

## 2021-05-19 DIAGNOSIS — E1169 Type 2 diabetes mellitus with other specified complication: Secondary | ICD-10-CM | POA: Diagnosis not present

## 2021-05-19 DIAGNOSIS — I130 Hypertensive heart and chronic kidney disease with heart failure and stage 1 through stage 4 chronic kidney disease, or unspecified chronic kidney disease: Secondary | ICD-10-CM | POA: Diagnosis not present

## 2021-05-19 DIAGNOSIS — M810 Age-related osteoporosis without current pathological fracture: Secondary | ICD-10-CM | POA: Diagnosis not present

## 2021-05-19 DIAGNOSIS — Z8616 Personal history of COVID-19: Secondary | ICD-10-CM | POA: Diagnosis not present

## 2021-05-19 DIAGNOSIS — E1122 Type 2 diabetes mellitus with diabetic chronic kidney disease: Secondary | ICD-10-CM | POA: Diagnosis not present

## 2021-05-19 DIAGNOSIS — E1165 Type 2 diabetes mellitus with hyperglycemia: Secondary | ICD-10-CM | POA: Diagnosis not present

## 2021-05-19 DIAGNOSIS — K589 Irritable bowel syndrome without diarrhea: Secondary | ICD-10-CM | POA: Diagnosis not present

## 2021-05-19 DIAGNOSIS — E785 Hyperlipidemia, unspecified: Secondary | ICD-10-CM | POA: Diagnosis not present

## 2021-05-19 DIAGNOSIS — E11319 Type 2 diabetes mellitus with unspecified diabetic retinopathy without macular edema: Secondary | ICD-10-CM | POA: Diagnosis not present

## 2021-05-19 DIAGNOSIS — L89620 Pressure ulcer of left heel, unstageable: Secondary | ICD-10-CM | POA: Diagnosis not present

## 2021-05-19 DIAGNOSIS — Z87440 Personal history of urinary (tract) infections: Secondary | ICD-10-CM | POA: Diagnosis not present

## 2021-05-19 DIAGNOSIS — M1990 Unspecified osteoarthritis, unspecified site: Secondary | ICD-10-CM | POA: Diagnosis not present

## 2021-05-19 DIAGNOSIS — I5042 Chronic combined systolic (congestive) and diastolic (congestive) heart failure: Secondary | ICD-10-CM | POA: Diagnosis not present

## 2021-05-19 DIAGNOSIS — M6281 Muscle weakness (generalized): Secondary | ICD-10-CM | POA: Diagnosis not present

## 2021-05-19 DIAGNOSIS — G4700 Insomnia, unspecified: Secondary | ICD-10-CM | POA: Diagnosis not present

## 2021-05-19 DIAGNOSIS — K219 Gastro-esophageal reflux disease without esophagitis: Secondary | ICD-10-CM | POA: Diagnosis not present

## 2021-05-19 DIAGNOSIS — E1140 Type 2 diabetes mellitus with diabetic neuropathy, unspecified: Secondary | ICD-10-CM | POA: Diagnosis not present

## 2021-05-20 ENCOUNTER — Telehealth: Payer: Self-pay | Admitting: *Deleted

## 2021-05-20 ENCOUNTER — Other Ambulatory Visit: Payer: Self-pay | Admitting: Family Medicine

## 2021-05-20 DIAGNOSIS — E1122 Type 2 diabetes mellitus with diabetic chronic kidney disease: Secondary | ICD-10-CM | POA: Diagnosis not present

## 2021-05-20 DIAGNOSIS — Z794 Long term (current) use of insulin: Secondary | ICD-10-CM

## 2021-05-20 DIAGNOSIS — E1165 Type 2 diabetes mellitus with hyperglycemia: Secondary | ICD-10-CM | POA: Diagnosis not present

## 2021-05-20 DIAGNOSIS — M810 Age-related osteoporosis without current pathological fracture: Secondary | ICD-10-CM | POA: Diagnosis not present

## 2021-05-20 DIAGNOSIS — I2510 Atherosclerotic heart disease of native coronary artery without angina pectoris: Secondary | ICD-10-CM | POA: Diagnosis not present

## 2021-05-20 DIAGNOSIS — M6281 Muscle weakness (generalized): Secondary | ICD-10-CM | POA: Diagnosis not present

## 2021-05-20 DIAGNOSIS — M1990 Unspecified osteoarthritis, unspecified site: Secondary | ICD-10-CM | POA: Diagnosis not present

## 2021-05-20 DIAGNOSIS — M86172 Other acute osteomyelitis, left ankle and foot: Secondary | ICD-10-CM | POA: Diagnosis not present

## 2021-05-20 DIAGNOSIS — E1140 Type 2 diabetes mellitus with diabetic neuropathy, unspecified: Secondary | ICD-10-CM | POA: Diagnosis not present

## 2021-05-20 DIAGNOSIS — K589 Irritable bowel syndrome without diarrhea: Secondary | ICD-10-CM | POA: Diagnosis not present

## 2021-05-20 DIAGNOSIS — L89620 Pressure ulcer of left heel, unstageable: Secondary | ICD-10-CM | POA: Diagnosis not present

## 2021-05-20 DIAGNOSIS — N3281 Overactive bladder: Secondary | ICD-10-CM | POA: Diagnosis not present

## 2021-05-20 DIAGNOSIS — Z87440 Personal history of urinary (tract) infections: Secondary | ICD-10-CM | POA: Diagnosis not present

## 2021-05-20 DIAGNOSIS — I130 Hypertensive heart and chronic kidney disease with heart failure and stage 1 through stage 4 chronic kidney disease, or unspecified chronic kidney disease: Secondary | ICD-10-CM | POA: Diagnosis not present

## 2021-05-20 DIAGNOSIS — N189 Chronic kidney disease, unspecified: Secondary | ICD-10-CM | POA: Diagnosis not present

## 2021-05-20 DIAGNOSIS — Z7984 Long term (current) use of oral hypoglycemic drugs: Secondary | ICD-10-CM | POA: Diagnosis not present

## 2021-05-20 DIAGNOSIS — E785 Hyperlipidemia, unspecified: Secondary | ICD-10-CM | POA: Diagnosis not present

## 2021-05-20 DIAGNOSIS — J449 Chronic obstructive pulmonary disease, unspecified: Secondary | ICD-10-CM | POA: Diagnosis not present

## 2021-05-20 DIAGNOSIS — E1169 Type 2 diabetes mellitus with other specified complication: Secondary | ICD-10-CM | POA: Diagnosis not present

## 2021-05-20 DIAGNOSIS — E11319 Type 2 diabetes mellitus with unspecified diabetic retinopathy without macular edema: Secondary | ICD-10-CM | POA: Diagnosis not present

## 2021-05-20 DIAGNOSIS — K219 Gastro-esophageal reflux disease without esophagitis: Secondary | ICD-10-CM | POA: Diagnosis not present

## 2021-05-20 DIAGNOSIS — I5042 Chronic combined systolic (congestive) and diastolic (congestive) heart failure: Secondary | ICD-10-CM | POA: Diagnosis not present

## 2021-05-20 DIAGNOSIS — Z8616 Personal history of COVID-19: Secondary | ICD-10-CM | POA: Diagnosis not present

## 2021-05-20 DIAGNOSIS — G4700 Insomnia, unspecified: Secondary | ICD-10-CM | POA: Diagnosis not present

## 2021-05-20 NOTE — Telephone Encounter (Signed)
Requested Prescriptions  ?Pending Prescriptions Disp Refills  ?? TRULICITY 1.5 XQ/1.1HE SOPN [Pharmacy Med Name: TRULICITY 1.5 RD/4.0 ML PEN]  2  ?  Sig: INJECT 1.'5MG'$  INTO THE SKIN ONCE A WEEK AS DIRECTED  ?  ? Endocrinology:  Diabetes - GLP-1 Receptor Agonists Passed - 05/20/2021  2:03 AM  ?  ?  Passed - HBA1C is between 0 and 7.9 and within 180 days  ?  Hemoglobin A1C  ?Date Value Ref Range Status  ?02/24/2021 7.5 (A) 4.0 - 5.6 % Final  ?  Comment:  ?  Average 169  ?11/26/2013 7.5 (H) 4.2 - 6.3 % Final  ?  Comment:  ?  The American Diabetes Association recommends that a primary goal of ?therapy should be <7% and that physicians should reevaluate the ?treatment regimen in patients with HbA1c values consistently >8%. ?  ? ?Hgb A1c MFr Bld  ?Date Value Ref Range Status  ?11/17/2020 7.5 (H) 4.8 - 5.6 % Final  ?  Comment:  ?  (NOTE) ?Pre diabetes:          5.7%-6.4% ? ?Diabetes:              >6.4% ? ?Glycemic control for   <7.0% ?adults with diabetes ?  ?   ?  ?  Passed - Valid encounter within last 6 months  ?  Recent Outpatient Visits   ?      ? 1 month ago Chronic heel pain, left  ? [facility] [name] T, FNP  ? 2 months ago 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 edema, with long-term current use of insulin ([place])  ? [facility] [name] T, FNP  ? 3 months ago Hospital discharge follow-up  ? [facility] [name] T, FNP  ? 6 months ago 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 edema, with long-term current use of insulin ([place])  ? [facility] [name], FNP  ? 11 months ago Localized swelling of left lower extremity  ? [facility] [place], [place] M, [place]  ?  ?  ? ?  ?  ?  ? ?

## 2021-05-20 NOTE — Telephone Encounter (Signed)
PT- re certification- request ?Patient HR yesterday: HR 100 at rest, exertion 110 ?Baseline is over 100- per patient ?Patient reports she is compliant with her medication ? Requesting verbal orders for continuation of PT- once weekly- for 8 weeks- balance, strengthening, gait training for prevention ?[place], [place] ?

## 2021-05-22 NOTE — Telephone Encounter (Signed)
Advised 

## 2021-05-26 ENCOUNTER — Ambulatory Visit (INDEPENDENT_AMBULATORY_CARE_PROVIDER_SITE_OTHER): Payer: Medicare Other | Admitting: Podiatry

## 2021-05-26 ENCOUNTER — Other Ambulatory Visit: Payer: Self-pay

## 2021-05-26 DIAGNOSIS — L97422 Non-pressure chronic ulcer of left heel and midfoot with fat layer exposed: Secondary | ICD-10-CM

## 2021-05-26 DIAGNOSIS — E0843 Diabetes mellitus due to underlying condition with diabetic autonomic (poly)neuropathy: Secondary | ICD-10-CM | POA: Diagnosis not present

## 2021-05-26 NOTE — Progress Notes (Signed)
? ?Subjective:  ?Patient presents today status post partial posterior calcanectomy with placement of antibiotic beads and application of wound graft left.  DOS: 11/20/2020.  Patient is doing well.  She states that she no longer has any pain associated to the area.  They have been applying collagen and a light dressing daily.  No new complaints at this time ? ?Past Medical History:  ?Diagnosis Date  ? Anxiety   ? Arthritis   ? CAD (coronary artery disease)   ? Chickenpox   ? Chronic systolic heart failure ([place])   ? COPD (chronic obstructive pulmonary disease) ([place])   ? mild-no inhalers  ? Coronary artery disease   ? 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 Depression   ? Diabetes mellitus type 2, uncomplicated ([place])   ? Diabetic retinopathy ([place])   ? Dupuytren contracture 2011  ? s/p surgery to LEFT hand  ? Frequent urinary tract infections   ? GERD (gastroesophageal reflux disease)   ? Grade I diastolic dysfunction   ? HTN (hypertension)   ? Hyperlipidemia, unspecified   ? Irritable bowel syndrome   ? Meningitis 11/2019  ? MI (myocardial infarction) ([place]) 1994  ? no stent  ? MRSA (methicillin resistant staph aureus) culture positive   ? Neuromuscular disorder ([place])   ? nerve pain in back and legs  ? Osteoporosis   ? Pneumonia 2021  ? RBBB   ? Sepsis ([place]) 11/2019  ? Skin cancer   ? TIA (transient ischemic attack) 2014  ? Vitamin D deficiency, unspecified   ? Wheezing   ? ?Past Surgical History:  ?Procedure Laterality Date  ? BACK SURGERY    ? lower due to spinal stenosis  ? BONE BIOPSY Left 11/20/2020  ? Procedure: BONE BIOPSY;  Surgeon: Edrick Kins, DPM;  Location: [facility] ORS;  Service: Podiatry;  Laterality: Left;  ? CARDIAC CATHETERIZATION  1994  ? CATARACT EXTRACTION, BILATERAL Bilateral   ? COLONOSCOPY    ? x3  ? COLONOSCOPY WITH PROPOFOL N/A 02/08/2019  ? Procedure: COLONOSCOPY WITH PROPOFOL;  Surgeon: [name], MD;  Location: [facility] ENDOSCOPY;  Service: Gastroenterology;  Laterality: N/A;  ? CORONARY ANGIOPLASTY   1994  ? DUPUYTREN CONTRACTURE RELEASE Left 2011  ? HARDWARE REMOVAL Left 08/19/2020  ? Procedure: HARDWARE REMOVAL;  Surgeon: Hessie Knows, MD;  Location: [facility] ORS;  Service: Orthopedics;  Laterality: Left;  ? HIP FRACTURE SURGERY  11/2013  ? IRRIGATION AND DEBRIDEMENT FOOT Left 11/20/2020  ? Procedure: IRRIGATION AND DEBRIDEMENT FOOT-Heel Ulcer;  Surgeon: Edrick Kins, DPM;  Location: [facility] ORS;  Service: Podiatry;  Laterality: Left;  ? PR COLSC FLX W/REMOVAL LESION BY HOT BX FORCEPS   08/21/2015  ? Procedure: COLONOSCOPY, FLEXIBLE, PROXIMAL TO SPLENIC FLEXURE; W/REMOVAL TUMOR/POLYP/OTHER LESION, HOT BX FORCEP/CAUTE; Surgeon: Carlena Hurl, MD; Location: OR [place]; Service: General Surgery  ? REPAIR DURAL / CSF LEAK    ? to left sinus  ? TEE WITHOUT CARDIOVERSION N/A 11/20/2019  ? Procedure: TRANSESOPHAGEAL ECHOCARDIOGRAM (TEE);  Surgeon: Nelva Bush, MD;  Location: [facility] ORS;  Service: Cardiovascular;  Laterality: N/A;  ? TOTAL HIP ARTHROPLASTY Left 08/19/2020  ? Procedure: TOTAL HIP ARTHROPLASTY ANTERIOR APPROACH;  Surgeon: Hessie Knows, MD;  Location: [facility] ORS;  Service: Orthopedics;  Laterality: Left;  ? ?Allergies  ?Allergen Reactions  ? Chlorhexidine   ? Metformin And Related Diarrhea  ? Sulfa Antibiotics Rash  ? ?Objective/Physical Exam ?Neurovascular status intact.  No edema noted.  No erythema.  Ulcer to the left  posterior heel has healed.  Complete reepithelialization has occurred.  There is some hyperkeratotic callus tissue but there is no underlying wound.  No drainage.  No erythema. ? ?Radiographic exam 03/24/2021 LT foot: ?Osseous remodeling noted to the posterior tubercle of the calcaneus.  Radiographically it is hard to determine remodeling versus residual chronic osteomyelitis.  Recommend continued observation and routine serial x-rays ? ?Assessment: ?1. s/p partial calcanectomy with placement of antibiotic beads and application of wound VAC left.. DOS: 11/20/2020 ?2.  Ulcer left  posterior heel secondary to diabetes mellitus ?3. H/O osteomyelitis LT calcaneus ? ? ?Plan of Care:  ?1. Patient was evaluated.  ?2.  The incision site has healed.  Recommend daily lotion or antibiotic cream to the freshly healed area ?3.  Patient may resume good supportive shoes and sneakers.  Discontinue postsurgical shoe ?4.  Return to clinic as needed ? ? ?Edrick Kins, DPM ?[place] ? ?Dr. Edrick Kins, DPM  ?  ?[address]                                    ?[place], [place] [address]                ?Office [phone]  ?Fax [phone] ? ? ? ? ? ?

## 2021-05-27 DIAGNOSIS — E1122 Type 2 diabetes mellitus with diabetic chronic kidney disease: Secondary | ICD-10-CM | POA: Diagnosis not present

## 2021-05-27 DIAGNOSIS — K589 Irritable bowel syndrome without diarrhea: Secondary | ICD-10-CM | POA: Diagnosis not present

## 2021-05-27 DIAGNOSIS — M810 Age-related osteoporosis without current pathological fracture: Secondary | ICD-10-CM | POA: Diagnosis not present

## 2021-05-27 DIAGNOSIS — M1990 Unspecified osteoarthritis, unspecified site: Secondary | ICD-10-CM | POA: Diagnosis not present

## 2021-05-27 DIAGNOSIS — N189 Chronic kidney disease, unspecified: Secondary | ICD-10-CM | POA: Diagnosis not present

## 2021-05-27 DIAGNOSIS — G4700 Insomnia, unspecified: Secondary | ICD-10-CM | POA: Diagnosis not present

## 2021-05-27 DIAGNOSIS — I2510 Atherosclerotic heart disease of native coronary artery without angina pectoris: Secondary | ICD-10-CM | POA: Diagnosis not present

## 2021-05-27 DIAGNOSIS — M86172 Other acute osteomyelitis, left ankle and foot: Secondary | ICD-10-CM | POA: Diagnosis not present

## 2021-05-27 DIAGNOSIS — E1165 Type 2 diabetes mellitus with hyperglycemia: Secondary | ICD-10-CM | POA: Diagnosis not present

## 2021-05-27 DIAGNOSIS — Z7984 Long term (current) use of oral hypoglycemic drugs: Secondary | ICD-10-CM | POA: Diagnosis not present

## 2021-05-27 DIAGNOSIS — L89626 Pressure-induced deep tissue damage of left heel: Secondary | ICD-10-CM | POA: Diagnosis not present

## 2021-05-27 DIAGNOSIS — Z8616 Personal history of COVID-19: Secondary | ICD-10-CM | POA: Diagnosis not present

## 2021-05-27 DIAGNOSIS — E1169 Type 2 diabetes mellitus with other specified complication: Secondary | ICD-10-CM | POA: Diagnosis not present

## 2021-05-27 DIAGNOSIS — K219 Gastro-esophageal reflux disease without esophagitis: Secondary | ICD-10-CM | POA: Diagnosis not present

## 2021-05-27 DIAGNOSIS — J449 Chronic obstructive pulmonary disease, unspecified: Secondary | ICD-10-CM | POA: Diagnosis not present

## 2021-05-27 DIAGNOSIS — M6281 Muscle weakness (generalized): Secondary | ICD-10-CM | POA: Diagnosis not present

## 2021-05-27 DIAGNOSIS — I5042 Chronic combined systolic (congestive) and diastolic (congestive) heart failure: Secondary | ICD-10-CM | POA: Diagnosis not present

## 2021-05-27 DIAGNOSIS — E11319 Type 2 diabetes mellitus with unspecified diabetic retinopathy without macular edema: Secondary | ICD-10-CM | POA: Diagnosis not present

## 2021-05-27 DIAGNOSIS — Z87440 Personal history of urinary (tract) infections: Secondary | ICD-10-CM | POA: Diagnosis not present

## 2021-05-27 DIAGNOSIS — I130 Hypertensive heart and chronic kidney disease with heart failure and stage 1 through stage 4 chronic kidney disease, or unspecified chronic kidney disease: Secondary | ICD-10-CM | POA: Diagnosis not present

## 2021-05-27 DIAGNOSIS — N3281 Overactive bladder: Secondary | ICD-10-CM | POA: Diagnosis not present

## 2021-05-27 DIAGNOSIS — E1140 Type 2 diabetes mellitus with diabetic neuropathy, unspecified: Secondary | ICD-10-CM | POA: Diagnosis not present

## 2021-05-27 DIAGNOSIS — E785 Hyperlipidemia, unspecified: Secondary | ICD-10-CM | POA: Diagnosis not present

## 2021-05-28 ENCOUNTER — Telehealth: Payer: Self-pay

## 2021-05-28 NOTE — Telephone Encounter (Signed)
?  Care Management  ? ?Follow Up Note ? ? ?05/28/2021 ?Name: Andrea [name] MRN: [id number] DOB: [birth year] ? ? ?Primary Care Provider: [name], FNP ?Reason for referral : Chronic Care Management ? ? ?An unsuccessful telephone outreach was attempted today. The patient was referred to the case management team for assistance with care management and care coordination.  ? ? ?Follow Up Plan:  ?A HIPAA compliant voice message was left today requesting a return call. ? ? ?Dahlila Pfahler,RN ?[facility] ?[place] ?[phone] ? ? ? ?

## 2021-06-01 NOTE — Progress Notes (Signed)
?  ?[employer] as a Education administrator for [name], FNP.,have documented all relevant documentation on the behalf of [name], FNP,as directed by  [name], FNP while in the presence of [name], FNP.  ? ?Established patient visit ? ? ?Patient: Andrea [name]   DOB: 1946-03-04   76 y.o. Female  MRN: [id number] ?Visit Date: 06/02/2021 ? ?Today's healthcare provider: [name], FNP  ?Re Introduced to nurse practitioner role and practice setting.  All questions answered.  Discussed provider/patient relationship and expectations. ? ? ?Chief Complaint  ?Patient presents with  ? Diabetes  ? ?Subjective  ?  ?HPI  ?Diabetes Mellitus Type II, Follow-up ? ?Lab Results  ?Component Value Date  ? HGBA1C 11.3 (A) 06/02/2021  ? HGBA1C 7.5 (A) 02/24/2021  ? HGBA1C 7.5 (H) 11/17/2020  ? ?Wt Readings from Last 3 Encounters:  ?06/02/21 137 lb 11.2 oz (62.5 kg)  ?04/28/21 135 lb 6 oz (61.4 kg)  ?02/24/21 135 lb (61.2 kg)  ? ?Last seen for diabetes 2 months ago.  ?Management since then includes Continue to recommend balanced, lower carb meals. Smaller meal size, adding snacks. Choosing water as drink of choice and increasing purposeful exercise.[name] Kitchen ?She reports excellent compliance with treatment. ?She is not having side effects.  ?Symptoms: ?No fatigue No foot ulcerations  ?No appetite changes No nausea  ?No paresthesia of the feet  Yes polydipsia  ?No polyuria No visual disturbances   ?No vomiting   ? ? ?Home blood sugar records: fasting range: 120 ? ?Episodes of hypoglycemia? No  ?  ?Current insulin regiment: lantus 20units qhs ?Most Recent Eye Exam: UTD ?Current exercise: no regular exercise ?Current diet habits: well balanced ? ?Pertinent Labs: ?Lab Results  ?Component Value Date  ? CHOL 115 02/11/2020  ? HDL 67 02/11/2020  ? [place] 29 02/11/2020  ? TRIG 105 02/11/2020  ? CHOLHDL 1.7 02/11/2020  ? Lab Results  ?Component Value Date  ? NA 142 12/22/2020  ? K 3.7 12/22/2020  ? CREATININE 1.22 (H)  12/22/2020  ? GFRNONAA 46 (L) 12/22/2020  ? MICROALBUR 20 12/16/2016  ?  ? ?---------------------------------------------------------------------------------------------------  ? ?Medications: ?Outpatient Medications Prior to Visit  ?Medication Sig  ? acetaminophen (TYLENOL) 325 MG tablet Take 2 tablets (650 mg total) by mouth every 6 (six) hours as needed for mild pain (or Fever >/= 101).  ? Alcohol Swabs (ALCOHOL PREP) PADS Use twice daily prior to SQ injection of insulin to clean skin  ? atorvastatin (LIPITOR) 20 MG tablet Take 1 tablet (20 mg total) by mouth at bedtime.  ? Calcium Carbonate-Vitamin D 600-400 MG-UNIT tablet Take 1 tablet by mouth 2 (two) times daily.  ? Cyanocobalamin (B-12) 1000 MCG CAPS Take 1,000 mcg by mouth daily.   ? furosemide (LASIX) 20 MG tablet Take 20 mg by mouth daily.  ? hydrOXYzine (ATARAX) 25 MG tablet TAKE 1 TABLET BY MOUTH THREE TIMES A DAY AS NEEDED  ? Insulin Pen Needle (TRUEPLUS PEN NEEDLES) 31G X 6 MM MISC Use with lantus two times daily.  ? INSULIN SYRINGE 1CC/29G 29G X 1/2" 1 ML MISC For lantus injections twice daily  ? LANTUS SOLOSTAR 100 UNIT/ML Solostar Pen Inject 20 Units into the skin at bedtime.  ? losartan (COZAAR) 25 MG tablet TAKE 1 TABLET BY MOUTH EVERY DAY  ? metoprolol succinate (TOPROL-XL) 25 MG 24 hr tablet Take 1 tablet (25 mg total) by mouth 2 (two) times daily.  ? Multiple Vitamins-Minerals (PRESERVISION AREDS 2  PO) Take 1 tablet by mouth in the morning and at bedtime.  ? Omega-3 1000 MG CAPS Take 1,000 mg by mouth daily.  ? omeprazole (PRILOSEC) 40 MG capsule TAKE 1 CAPSULE BY MOUTH EVERY DAY  ? OneTouch Delica Lancets 38G MISC To check blood sugar daily  DX: E11.9  ? ONETOUCH VERIO test strip TO CHECK BLOOD SUGAR ONCE DAILY.  ? oxybutynin (DITROPAN-XL) 10 MG 24 hr tablet TAKE 1 TABLET BY MOUTH EVERYDAY AT BEDTIME  ? oxyCODONE (OXY IR/ROXICODONE) 5 MG immediate release tablet Take 1 tablet (5 mg total) by mouth every 4 (four) hours as needed for severe  pain.  ? potassium chloride (KLOR-CON) 10 MEQ tablet TAKE 1 TABLET BY MOUTH EVERY DAY  ? sertraline (ZOLOFT) 25 MG tablet Take 1 tablet (25 mg total) by mouth daily.  ? traZODone (DESYREL) 100 MG tablet Take 0.5 tablets (50 mg total) by mouth at bedtime.  ? TRULICITY 1.5 TX/6.4WO SOPN INJECT 1.'5MG'$  INTO THE SKIN ONCE A WEEK AS DIRECTED  ? vitamin C (ASCORBIC ACID) 500 MG tablet Take 500 mg by mouth 2 (two) times daily.  ? Vitamin D, Ergocalciferol, (DRISDOL) 1.25 MG (50000 UNIT) CAPS capsule TAKE ONE CAPSULE BY MOUTH ONCE WEEKLY ON THE SAME DAY EACH WEEK (ON FRIDAYS) (Patient taking differently: Take 50,000 Units by mouth every 7 (seven) days.)  ? zinc gluconate 50 MG tablet Take 50 mg by mouth daily.  ? [DISCONTINUED] amoxicillin-clavulanate (AUGMENTIN) 875-125 MG tablet Take 1 tablet by mouth 2 (two) times daily. (Patient not taking: Reported on 04/28/2021)  ? ?No facility-administered medications prior to visit.  ? ? ?Review of Systems ? ? ?  Objective  ?  ?BP 115/71   Pulse 91   Resp 15   Wt 137 lb 11.2 oz (62.5 kg)   SpO2 98%   BMI 26.89 kg/m?  ? ? ?Physical Exam ?Vitals and nursing note reviewed.  ?Constitutional:   ?   General: She is not in acute distress. ?   Appearance: Normal appearance. She is well-groomed and overweight. She is not ill-appearing, toxic-appearing or diaphoretic.  ?HENT:  ?   Head: Normocephalic and atraumatic.  ?Cardiovascular:  ?   Rate and Rhythm: Normal rate and regular rhythm.  ?   Pulses: Normal pulses.  ?   Heart sounds: Normal heart sounds. No murmur heard. ?  No friction rub. No gallop.  ?Pulmonary:  ?   Effort: Pulmonary effort is normal. No respiratory distress.  ?   Breath sounds: Normal breath sounds. No stridor. No wheezing, rhonchi or rales.  ?Chest:  ?   Chest wall: No tenderness.  ?Abdominal:  ?   General: Bowel sounds are normal.  ?   Palpations: Abdomen is soft.  ?Musculoskeletal:     ?   General: No swelling, tenderness, deformity or signs of injury. Normal range of  motion.  ?   Right lower leg: No edema.  ?   Left lower leg: No edema.  ?Skin: ?   General: Skin is warm and dry.  ?   Capillary Refill: Capillary refill takes less than 2 seconds.  ?   Coloration: Skin is not jaundiced or pale.  ?   Findings: No bruising, erythema, lesion or rash.  ?Neurological:  ?   General: No focal deficit present.  ?   Mental Status: She is alert and oriented to person, place, and time. Mental status is at baseline.  ?   Cranial Nerves: No cranial nerve deficit.  ?  Sensory: No sensory deficit.  ?   Motor: No weakness.  ?   Coordination: Coordination normal.  ?Psychiatric:     ?   Mood and Affect: Mood normal.     ?   Behavior: Behavior normal.     ?   Thought Content: Thought content normal.     ?   Judgment: Judgment normal.  ?  ? ?Results for orders placed or performed in visit on 06/02/21  ?POCT glycosylated hemoglobin (Hb A1C)  ?Result Value Ref Range  ? Hemoglobin A1C 11.3 (A) 4.0 - 5.6 %  ? HbA1c POC (<> result, manual entry)    ? HbA1c, POC (prediabetic range)    ? HbA1c, POC (controlled diabetic range)    ? ? Assessment & Plan  ?  ? ?Problem List Items Addressed This Visit   ? ?  ? Endocrine  ? Type 2 diabetes mellitus with hyperlipidemia ([place]) - Primary  ?  Chronic, previously stable ?POCT A1c elevated today to >11% ?Will repeat with blood draw to confirm ?Pt has reported increased urination ?Has been on 20U of Lantus QHS and 1.5 mg Trulicity weekly ?Has been working to "increase weight" in last 3 months since previous muscle wasting s/p osteomyelitis in heel ?Continue to recommend balanced, lower carb meals. Smaller meal size, adding snacks. Choosing water as drink of choice and increasing purposeful exercise. ? ? ?  ?  ? Relevant Orders  ? POCT glycosylated hemoglobin (Hb A1C) (Completed)  ? Hemoglobin A1c  ? Lipid panel  ? Comprehensive metabolic panel  ?  ? Other  ? Hair loss  ?  Acute, undiagnosed concern ?Will check TSH and Free T4 ?Will refer to derm ?Recommend use of topical  steroid, Rx ?Continue gentle shampoo  ?RTC as needed; you will be contacted to schedule appt with derm  ?  ?  ? Relevant Medications  ? clobetasol (TEMOVATE) 0.05 % external solution  ? Other Relevant Orders  ? TS

## 2021-06-02 ENCOUNTER — Ambulatory Visit (INDEPENDENT_AMBULATORY_CARE_PROVIDER_SITE_OTHER): Payer: Medicare Other | Admitting: Family Medicine

## 2021-06-02 ENCOUNTER — Encounter: Payer: Self-pay | Admitting: Family Medicine

## 2021-06-02 ENCOUNTER — Other Ambulatory Visit: Payer: Self-pay

## 2021-06-02 ENCOUNTER — Other Ambulatory Visit: Payer: Self-pay | Admitting: Family Medicine

## 2021-06-02 VITALS — BP 115/71 | HR 91 | Resp 15 | Wt 137.7 lb

## 2021-06-02 DIAGNOSIS — E1169 Type 2 diabetes mellitus with other specified complication: Secondary | ICD-10-CM | POA: Diagnosis not present

## 2021-06-02 DIAGNOSIS — E785 Hyperlipidemia, unspecified: Secondary | ICD-10-CM | POA: Diagnosis not present

## 2021-06-02 DIAGNOSIS — Z23 Encounter for immunization: Secondary | ICD-10-CM | POA: Insufficient documentation

## 2021-06-02 DIAGNOSIS — L659 Nonscarring hair loss, unspecified: Secondary | ICD-10-CM | POA: Insufficient documentation

## 2021-06-02 LAB — POCT GLYCOSYLATED HEMOGLOBIN (HGB A1C): Hemoglobin A1C: 11.3 % — AB (ref 4.0–5.6)

## 2021-06-02 MED ORDER — CLOBETASOL PROPIONATE 0.05 % EX SOLN: 1.0000 "application " | Freq: Two times a day (BID) | CUTANEOUS | 1 refills | Status: DC

## 2021-06-02 NOTE — Assessment & Plan Note (Signed)
Chronic, previously stable ?POCT A1c elevated today to >11% ?Will repeat with blood draw to confirm ?Pt has reported increased urination ?Has been on 20U of Lantus QHS and 1.5 mg Trulicity weekly ?Has been working to "increase weight" in last 3 months since previous muscle wasting s/p osteomyelitis in heel ?Continue to recommend balanced, lower carb meals. Smaller meal size, adding snacks. Choosing water as drink of choice and increasing purposeful exercise. ? ? ?

## 2021-06-02 NOTE — Assessment & Plan Note (Signed)
Hx of PNA; will start vaccinations today  ?Consent received  ?

## 2021-06-02 NOTE — Assessment & Plan Note (Signed)
Acute, undiagnosed concern ?Will check TSH and Free T4 ?Will refer to derm ?Recommend use of topical steroid, Rx ?Continue gentle shampoo  ?RTC as needed; you will be contacted to schedule appt with derm  ?

## 2021-06-03 DIAGNOSIS — M1990 Unspecified osteoarthritis, unspecified site: Secondary | ICD-10-CM | POA: Diagnosis not present

## 2021-06-03 DIAGNOSIS — N189 Chronic kidney disease, unspecified: Secondary | ICD-10-CM | POA: Diagnosis not present

## 2021-06-03 DIAGNOSIS — Z87440 Personal history of urinary (tract) infections: Secondary | ICD-10-CM | POA: Diagnosis not present

## 2021-06-03 DIAGNOSIS — M86172 Other acute osteomyelitis, left ankle and foot: Secondary | ICD-10-CM | POA: Diagnosis not present

## 2021-06-03 DIAGNOSIS — I130 Hypertensive heart and chronic kidney disease with heart failure and stage 1 through stage 4 chronic kidney disease, or unspecified chronic kidney disease: Secondary | ICD-10-CM | POA: Diagnosis not present

## 2021-06-03 DIAGNOSIS — K589 Irritable bowel syndrome without diarrhea: Secondary | ICD-10-CM | POA: Diagnosis not present

## 2021-06-03 DIAGNOSIS — L89626 Pressure-induced deep tissue damage of left heel: Secondary | ICD-10-CM | POA: Diagnosis not present

## 2021-06-03 DIAGNOSIS — I2510 Atherosclerotic heart disease of native coronary artery without angina pectoris: Secondary | ICD-10-CM | POA: Diagnosis not present

## 2021-06-03 DIAGNOSIS — E1122 Type 2 diabetes mellitus with diabetic chronic kidney disease: Secondary | ICD-10-CM | POA: Diagnosis not present

## 2021-06-03 DIAGNOSIS — J449 Chronic obstructive pulmonary disease, unspecified: Secondary | ICD-10-CM | POA: Diagnosis not present

## 2021-06-03 DIAGNOSIS — Z7984 Long term (current) use of oral hypoglycemic drugs: Secondary | ICD-10-CM | POA: Diagnosis not present

## 2021-06-03 DIAGNOSIS — Z8616 Personal history of COVID-19: Secondary | ICD-10-CM | POA: Diagnosis not present

## 2021-06-03 DIAGNOSIS — K219 Gastro-esophageal reflux disease without esophagitis: Secondary | ICD-10-CM | POA: Diagnosis not present

## 2021-06-03 DIAGNOSIS — M810 Age-related osteoporosis without current pathological fracture: Secondary | ICD-10-CM | POA: Diagnosis not present

## 2021-06-03 DIAGNOSIS — E1140 Type 2 diabetes mellitus with diabetic neuropathy, unspecified: Secondary | ICD-10-CM | POA: Diagnosis not present

## 2021-06-03 DIAGNOSIS — E1165 Type 2 diabetes mellitus with hyperglycemia: Secondary | ICD-10-CM | POA: Diagnosis not present

## 2021-06-03 DIAGNOSIS — G4700 Insomnia, unspecified: Secondary | ICD-10-CM | POA: Diagnosis not present

## 2021-06-03 DIAGNOSIS — N3281 Overactive bladder: Secondary | ICD-10-CM | POA: Diagnosis not present

## 2021-06-03 DIAGNOSIS — E1169 Type 2 diabetes mellitus with other specified complication: Secondary | ICD-10-CM | POA: Diagnosis not present

## 2021-06-03 DIAGNOSIS — M6281 Muscle weakness (generalized): Secondary | ICD-10-CM | POA: Diagnosis not present

## 2021-06-03 DIAGNOSIS — I5042 Chronic combined systolic (congestive) and diastolic (congestive) heart failure: Secondary | ICD-10-CM | POA: Diagnosis not present

## 2021-06-03 DIAGNOSIS — E11319 Type 2 diabetes mellitus with unspecified diabetic retinopathy without macular edema: Secondary | ICD-10-CM | POA: Diagnosis not present

## 2021-06-03 DIAGNOSIS — E785 Hyperlipidemia, unspecified: Secondary | ICD-10-CM | POA: Diagnosis not present

## 2021-06-03 LAB — COMPREHENSIVE METABOLIC PANEL
Albumin: 4.2 (ref 3.5–5.0)
Calcium: 9.4 (ref 8.7–10.7)
Globulin: 2.5
eGFR: 51

## 2021-06-03 LAB — LIPID PANEL
Cholesterol: 136 (ref 0–200)
HDL: 66 (ref 35–70)
LDL Cholesterol: 41
Triglycerides: 178 — AB (ref 40–160)

## 2021-06-03 LAB — BASIC METABOLIC PANEL
Creatinine: 1.1 (ref 0.5–1.1)
Glucose: 243
Potassium: 5 mEq/L (ref 3.5–5.1)
Sodium: 138 (ref 137–147)

## 2021-06-03 LAB — TSH: TSH: 1.88 (ref 0.41–5.90)

## 2021-06-03 LAB — HEPATIC FUNCTION PANEL
ALT: 10 U/L (ref 7–35)
AST: 15 (ref 13–35)
Bilirubin, Total: 0.6

## 2021-06-03 LAB — HEMOGLOBIN A1C: Hemoglobin A1C: 11.3

## 2021-06-08 ENCOUNTER — Other Ambulatory Visit: Payer: Self-pay | Admitting: Family Medicine

## 2021-06-08 DIAGNOSIS — I10 Essential (primary) hypertension: Secondary | ICD-10-CM

## 2021-06-08 LAB — COMPREHENSIVE METABOLIC PANEL
ALT: 10 IU/L (ref 0–32)
AST: 15 IU/L (ref 0–40)
Albumin/Globulin Ratio: 1.7 (ref 1.2–2.2)
Albumin: 4.2 g/dL (ref 3.7–4.7)
Alkaline Phosphatase: 144 IU/L — ABNORMAL HIGH (ref 44–121)
BUN/Creatinine Ratio: 27 (ref 12–28)
BUN: 30 mg/dL — ABNORMAL HIGH (ref 8–27)
Bilirubin Total: 0.6 mg/dL (ref 0.0–1.2)
CO2: 22 mmol/L (ref 20–29)
Calcium: 9.4 mg/dL (ref 8.7–10.3)
Chloride: 101 mmol/L (ref 96–106)
Creatinine, Ser: 1.12 mg/dL — ABNORMAL HIGH (ref 0.57–1.00)
Globulin, Total: 2.5 g/dL (ref 1.5–4.5)
Glucose: 243 mg/dL — ABNORMAL HIGH (ref 70–99)
Potassium: 5 mmol/L (ref 3.5–5.2)
Sodium: 138 mmol/L (ref 134–144)
Total Protein: 6.7 g/dL (ref 6.0–8.5)
eGFR: 51 mL/min/{1.73_m2} — ABNORMAL LOW (ref >59)

## 2021-06-08 LAB — LIPID PANEL
Chol/HDL Ratio: 2.1 ratio (ref 0.0–4.4)
Cholesterol, Total: 136 mg/dL (ref 100–199)
HDL: 66 mg/dL (ref >39)
LDL Chol Calc (NIH): 41 mg/dL (ref 0–99)
Triglycerides: 178 mg/dL — ABNORMAL HIGH (ref 0–149)
VLDL Cholesterol Cal: 29 mg/dL (ref 5–40)

## 2021-06-08 LAB — HEMOGLOBIN A1C
Est. average glucose Bld gHb Est-mCnc: 278 mg/dL
Hgb A1c MFr Bld: 11.3 % — ABNORMAL HIGH (ref 4.8–5.6)

## 2021-06-08 LAB — TSH+FREE T4
Free T4: 1.33 ng/dL (ref 0.82–1.77)
TSH: 1.88 u[IU]/mL (ref 0.450–4.500)

## 2021-06-09 ENCOUNTER — Telehealth: Payer: Self-pay

## 2021-06-09 ENCOUNTER — Encounter: Payer: Self-pay | Admitting: Family Medicine

## 2021-06-09 NOTE — Telephone Encounter (Signed)
Contacted patient who states that she had labs drawn in our office, I reached out to  [facility] and they should be faxing over a copy of report today. KW ?

## 2021-06-09 NOTE — Telephone Encounter (Signed)
Requested Prescriptions  ?Pending Prescriptions Disp Refills  ?? losartan (COZAAR) 25 MG tablet [Pharmacy Med Name: LOSARTAN POTASSIUM 25 MG TAB] 90 tablet 0  ?  Sig: TAKE 1 TABLET BY MOUTH EVERY DAY  ?  ? Cardiovascular:  Angiotensin Receptor Blockers Failed - 06/08/2021  1:59 AM  ?  ?  Failed - Cr in normal range and within 180 days  ?  Creat  ?Date Value Ref Range Status  ?12/16/2016 1.08 (H) 0.60 - 0.93 mg/dL Final  ?  Comment:  ?  For patients >66 years of age, the reference limit ?for Creatinine is approximately 13% higher for people ?identified as African-American. ?. ?  ? ?Creatinine, Ser  ?Date Value Ref Range Status  ?12/22/2020 1.22 (H) 0.44 - 1.00 mg/dL Final  ?   ?  ?  Passed - K in normal range and within 180 days  ?  Potassium  ?Date Value Ref Range Status  ?12/22/2020 3.7 3.5 - 5.1 mmol/L Final  ?01/01/2014 4.2 3.5 - 5.1 mmol/L Final  ?   ?  ?  Passed - Patient is not pregnant  ?  ?  Passed - Last BP in normal range  ?  BP Readings from Last 1 Encounters:  ?06/02/21 115/71  ?   ?  ?  Passed - Valid encounter within last 6 months  ?  Recent Outpatient Visits   ?      ? 1 week ago Type 2 diabetes mellitus with hyperlipidemia ([place])  ? [facility] [name] T, FNP  ? 2 months ago Chronic heel pain, left  ? [facility] [name] T, FNP  ? 3 months ago 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 edema, with long-term current use of insulin ([place])  ? [facility] [name] T, FNP  ? 4 months ago Hospital discharge follow-up  ? [facility] [name] T, FNP  ? 6 months ago 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 edema, with long-term current use of insulin ([place])  ? [facility] [name] T, FNP  ?  ?  ?Future Appointments   ?        ? In 2 months [name], [place], [place]  ? In 5  months Ralene Bathe, MD [place]  ?  ? ?  ?  ?  ? ?

## 2021-06-09 NOTE — Telephone Encounter (Signed)
Copied from [place] [phone]. Topic: General - Other ?>> Jun 09, 2021  8:40 AM Valere Dross wrote: ?Reason for CRM: Pt called in stating she is still waiting to get her lab results, please advise. ?

## 2021-06-10 DIAGNOSIS — E785 Hyperlipidemia, unspecified: Secondary | ICD-10-CM | POA: Diagnosis not present

## 2021-06-10 DIAGNOSIS — I2510 Atherosclerotic heart disease of native coronary artery without angina pectoris: Secondary | ICD-10-CM | POA: Diagnosis not present

## 2021-06-10 DIAGNOSIS — Z8616 Personal history of COVID-19: Secondary | ICD-10-CM | POA: Diagnosis not present

## 2021-06-10 DIAGNOSIS — E1122 Type 2 diabetes mellitus with diabetic chronic kidney disease: Secondary | ICD-10-CM | POA: Diagnosis not present

## 2021-06-10 DIAGNOSIS — E1169 Type 2 diabetes mellitus with other specified complication: Secondary | ICD-10-CM | POA: Diagnosis not present

## 2021-06-10 DIAGNOSIS — J449 Chronic obstructive pulmonary disease, unspecified: Secondary | ICD-10-CM | POA: Diagnosis not present

## 2021-06-10 DIAGNOSIS — K219 Gastro-esophageal reflux disease without esophagitis: Secondary | ICD-10-CM | POA: Diagnosis not present

## 2021-06-10 DIAGNOSIS — L89626 Pressure-induced deep tissue damage of left heel: Secondary | ICD-10-CM | POA: Diagnosis not present

## 2021-06-10 DIAGNOSIS — G4700 Insomnia, unspecified: Secondary | ICD-10-CM | POA: Diagnosis not present

## 2021-06-10 DIAGNOSIS — M810 Age-related osteoporosis without current pathological fracture: Secondary | ICD-10-CM | POA: Diagnosis not present

## 2021-06-10 DIAGNOSIS — I130 Hypertensive heart and chronic kidney disease with heart failure and stage 1 through stage 4 chronic kidney disease, or unspecified chronic kidney disease: Secondary | ICD-10-CM | POA: Diagnosis not present

## 2021-06-10 DIAGNOSIS — M1990 Unspecified osteoarthritis, unspecified site: Secondary | ICD-10-CM | POA: Diagnosis not present

## 2021-06-10 DIAGNOSIS — N3281 Overactive bladder: Secondary | ICD-10-CM | POA: Diagnosis not present

## 2021-06-10 DIAGNOSIS — M86172 Other acute osteomyelitis, left ankle and foot: Secondary | ICD-10-CM | POA: Diagnosis not present

## 2021-06-10 DIAGNOSIS — K589 Irritable bowel syndrome without diarrhea: Secondary | ICD-10-CM | POA: Diagnosis not present

## 2021-06-10 DIAGNOSIS — E11319 Type 2 diabetes mellitus with unspecified diabetic retinopathy without macular edema: Secondary | ICD-10-CM | POA: Diagnosis not present

## 2021-06-10 DIAGNOSIS — Z7984 Long term (current) use of oral hypoglycemic drugs: Secondary | ICD-10-CM | POA: Diagnosis not present

## 2021-06-10 DIAGNOSIS — I5042 Chronic combined systolic (congestive) and diastolic (congestive) heart failure: Secondary | ICD-10-CM | POA: Diagnosis not present

## 2021-06-10 DIAGNOSIS — N189 Chronic kidney disease, unspecified: Secondary | ICD-10-CM | POA: Diagnosis not present

## 2021-06-10 DIAGNOSIS — Z87440 Personal history of urinary (tract) infections: Secondary | ICD-10-CM | POA: Diagnosis not present

## 2021-06-10 DIAGNOSIS — M6281 Muscle weakness (generalized): Secondary | ICD-10-CM | POA: Diagnosis not present

## 2021-06-10 DIAGNOSIS — E1140 Type 2 diabetes mellitus with diabetic neuropathy, unspecified: Secondary | ICD-10-CM | POA: Diagnosis not present

## 2021-06-10 DIAGNOSIS — E1165 Type 2 diabetes mellitus with hyperglycemia: Secondary | ICD-10-CM | POA: Diagnosis not present

## 2021-06-11 ENCOUNTER — Telehealth: Payer: Self-pay | Admitting: Family Medicine

## 2021-06-11 NOTE — Telephone Encounter (Signed)
Copied from [place] [id number]. Topic: General - Other ?>> Jun 11, 2021  8:09 AM [name] wrote: ?Reason for CRM: Patient called in to inform Ms Rollene Rotunda that she received a letter from [facility] skin that stated they will not be able to see her until September she want to know if there is someone else and or something that can be prescribed to aid in her hair loss because patient feel like if she have to wait till September she will be bald. Please call patient at  Ph#[phone] ?

## 2021-06-17 ENCOUNTER — Telehealth: Payer: Self-pay | Admitting: *Deleted

## 2021-06-17 ENCOUNTER — Other Ambulatory Visit: Payer: Self-pay | Admitting: Family Medicine

## 2021-06-17 DIAGNOSIS — L650 Telogen effluvium: Secondary | ICD-10-CM | POA: Diagnosis not present

## 2021-06-17 DIAGNOSIS — K219 Gastro-esophageal reflux disease without esophagitis: Secondary | ICD-10-CM

## 2021-06-17 DIAGNOSIS — E559 Vitamin D deficiency, unspecified: Secondary | ICD-10-CM | POA: Diagnosis not present

## 2021-06-17 DIAGNOSIS — E1169 Type 2 diabetes mellitus with other specified complication: Secondary | ICD-10-CM

## 2021-06-17 DIAGNOSIS — E1165 Type 2 diabetes mellitus with hyperglycemia: Secondary | ICD-10-CM

## 2021-06-17 NOTE — Chronic Care Management (AMB) (Deleted)
?  Chronic Care Management  ? ?Outreach Note ? ?06/17/2021 ?Name: NEYA CREEGAN MRN: [id number] DOB: 05/19/1945 ? ?LORETA BLOUCH is a 76 y.o. year old female who is a primary care patient of [name], FNP. I reached out to [name] by phone today in response to a referral sent by Ms. Verdene Lennert Goebel's primary care provider. ? ?An unsuccessful telephone outreach was attempted today. The patient was referred to the case management team for assistance with care management and care coordination.  ? ?Follow Up Plan: A HIPAA compliant phone message was left for the patient providing contact information and requesting a return call.  ? ?Tasheena W[place]olt, CCMA ?Care Guide, Embedded Care Coordination ?[place]  Care Management  ?Direct Dial: [phone] ? ? ?

## 2021-06-17 NOTE — Chronic Care Management (AMB) (Signed)
?  Chronic Care Management  ? ?Note ? ?06/17/2021 ?Name: Andrea [name] MRN: [id number] DOB: [birth year] ? ?Andrea [name] is a 76 y.o. year old female who is a primary care patient of [name], FNP. Andrea [name] is currently enrolled in care management services. An additional referral for Pharm D was placed.  ? ?Follow up plan: ?Unsuccessful telephone outreach attempt made. A HIPAA compliant phone message was left for the patient providing contact information and requesting a return call.  ? ?Jaquanna Ballentine, CCMA ?Care Guide, Embedded Care Coordination ?[place]  Care Management  ?Direct Dial: [phone] ? ? ?

## 2021-06-18 DIAGNOSIS — I2510 Atherosclerotic heart disease of native coronary artery without angina pectoris: Secondary | ICD-10-CM | POA: Diagnosis not present

## 2021-06-18 DIAGNOSIS — M6281 Muscle weakness (generalized): Secondary | ICD-10-CM | POA: Diagnosis not present

## 2021-06-18 DIAGNOSIS — E1140 Type 2 diabetes mellitus with diabetic neuropathy, unspecified: Secondary | ICD-10-CM | POA: Diagnosis not present

## 2021-06-18 DIAGNOSIS — I130 Hypertensive heart and chronic kidney disease with heart failure and stage 1 through stage 4 chronic kidney disease, or unspecified chronic kidney disease: Secondary | ICD-10-CM | POA: Diagnosis not present

## 2021-06-18 DIAGNOSIS — E1122 Type 2 diabetes mellitus with diabetic chronic kidney disease: Secondary | ICD-10-CM | POA: Diagnosis not present

## 2021-06-18 DIAGNOSIS — M86172 Other acute osteomyelitis, left ankle and foot: Secondary | ICD-10-CM | POA: Diagnosis not present

## 2021-06-18 DIAGNOSIS — J449 Chronic obstructive pulmonary disease, unspecified: Secondary | ICD-10-CM | POA: Diagnosis not present

## 2021-06-18 DIAGNOSIS — E785 Hyperlipidemia, unspecified: Secondary | ICD-10-CM | POA: Diagnosis not present

## 2021-06-18 DIAGNOSIS — N189 Chronic kidney disease, unspecified: Secondary | ICD-10-CM | POA: Diagnosis not present

## 2021-06-18 DIAGNOSIS — E11319 Type 2 diabetes mellitus with unspecified diabetic retinopathy without macular edema: Secondary | ICD-10-CM | POA: Diagnosis not present

## 2021-06-18 DIAGNOSIS — K219 Gastro-esophageal reflux disease without esophagitis: Secondary | ICD-10-CM | POA: Diagnosis not present

## 2021-06-18 DIAGNOSIS — L89626 Pressure-induced deep tissue damage of left heel: Secondary | ICD-10-CM | POA: Diagnosis not present

## 2021-06-18 DIAGNOSIS — G4700 Insomnia, unspecified: Secondary | ICD-10-CM | POA: Diagnosis not present

## 2021-06-18 DIAGNOSIS — Z87440 Personal history of urinary (tract) infections: Secondary | ICD-10-CM | POA: Diagnosis not present

## 2021-06-18 DIAGNOSIS — Z8616 Personal history of COVID-19: Secondary | ICD-10-CM | POA: Diagnosis not present

## 2021-06-18 DIAGNOSIS — Z7984 Long term (current) use of oral hypoglycemic drugs: Secondary | ICD-10-CM | POA: Diagnosis not present

## 2021-06-18 DIAGNOSIS — M1990 Unspecified osteoarthritis, unspecified site: Secondary | ICD-10-CM | POA: Diagnosis not present

## 2021-06-18 DIAGNOSIS — N3281 Overactive bladder: Secondary | ICD-10-CM | POA: Diagnosis not present

## 2021-06-18 DIAGNOSIS — M810 Age-related osteoporosis without current pathological fracture: Secondary | ICD-10-CM | POA: Diagnosis not present

## 2021-06-18 DIAGNOSIS — I5042 Chronic combined systolic (congestive) and diastolic (congestive) heart failure: Secondary | ICD-10-CM | POA: Diagnosis not present

## 2021-06-18 DIAGNOSIS — E1169 Type 2 diabetes mellitus with other specified complication: Secondary | ICD-10-CM | POA: Diagnosis not present

## 2021-06-18 DIAGNOSIS — K589 Irritable bowel syndrome without diarrhea: Secondary | ICD-10-CM | POA: Diagnosis not present

## 2021-06-18 DIAGNOSIS — E1165 Type 2 diabetes mellitus with hyperglycemia: Secondary | ICD-10-CM | POA: Diagnosis not present

## 2021-06-18 NOTE — Chronic Care Management (AMB) (Signed)
?  Chronic Care Management  ? ?Note ? ?06/18/2021 ?Name: Andrea [name] MRN: [id number] DOB: 31-Mar-1945 ? ?Andrea [name] is a 76 y.o. year old female who is a primary care patient of [name], FNP. Andrea [name] is currently enrolled in care management services. An additional referral for Pharm D was placed.  ? ?Follow up plan: ?Telephone appointment with care management team member scheduled for: 06/23/2021 ? ?Tyshana Nishida, CCMA ?Care Guide, Embedded Care Coordination ?[place]  Care Management  ?Direct Dial: [phone] ? ? ?

## 2021-06-19 ENCOUNTER — Telehealth: Payer: Self-pay

## 2021-06-19 NOTE — Progress Notes (Signed)
? ? ?Chronic Care Management ?Pharmacy Assistant  ? ?Name: Andrea [name]  MRN: [id number] DOB: 25-Feb-1946 ? ?Initial Visit with [name], CPP on 06/23/2021 @ 0900 ?Initial Visit Assessment ?  ?Conditions to be addressed/monitored: ?CHF, CAD, HTN, HLD, COPD, DMII, Depression, Osteoporosis, and Bundle Branch Block, Right, PAD, Anemia, Obese, Insomnia, Chronic Heel pain, left,  ? ?Primary concerns for visit include: ?Patient is having a financial barrier when it comes to her Trulicity. ? ?Recent office visits:  ?06/02/2021 [name], FNP (PCP Office Visit) for Type II DM- Stopped: Amoxicillin due to patient not taking, Started: Clobetasol Propionate 0.53% 1 application daily, Lab orders placed, Referral for Dermatology placed, Patient to follow-up in 4 months ? ?04/02/2021 [name], FNP (PCP Office Visit) for Medication Refill- No medication changes noted, No orders placed, Patient to follow-up in 4 weeks ? ?02/24/2021 [name], FNP (PCP Office Visit) for Type II DM- Stopped: Doxycycline 100 mg due to patient not taking, Changed: Insulin Glargine 14 units daily to 20 units daily, Lab order placed, Patient to follow-up in 3 months ? ?01/23/2021 [name], FNP (PCP Office Visit) for Hospital Discharge Follow-up- Changed: Trazodone HCl 100 mg daily to 50 mg daily, No orders placed, Patient to follow-up in 4 weeks ? ?Recent consult visits:  ?05/26/2021 Daylene Katayama, DPM (Podiatry) for Diabetic Ulcer- No medication changes noted, No orders placed, Patient to follow-up as needed ? ?04/28/2021 Ida Rogue, MD (Cardiology) for 1 year follow-up- Changed: Metoprolol Succinate 25 mg 1 tablet daily to twice daily, EKG 12-Lead ordered, Patient to follow-up in 1 year ? ?04/21/2021 Daylene Katayama, DPM (Podiatry) for Diabetic Ulcer- No medication changes noted, DG Foot Complete Left ordered, Patient to follow-up in 4 weeks ? ?03/24/2021 Daylene Katayama, DPM (Podiatry) for Wound Check-No medication changes noted, DG Foot  Complete Left ordered, Patient to follow-up in 4 weeks ? ?02/24/2021  Daylene Katayama, DPM (Podiatry) for Follow-up- No medication changes noted, Wound Culture ordered, Patient to follow-up in 4 weeks ? ?01/27/2021  Daylene Katayama, DPM (Podiatry) for Routine Post Op-No medication changes noted, No orders placed, Patient to follow-up in 4 weeks ? ?01/27/2021 Tsosie Billing, MD (Infectious Disease) for-Wound- No medication changes noted, Aerobic culture order placed, patient to follow-up as needed ? ?12/30/2020 Daylene Katayama, DPM (Podiatry) for Routine Post Op- No medication changes noted, No orders placed, Patient to follow-up in 4 weeks ? ?Hospital visits:  ?No recent Admits ? ?Medications: ?Outpatient Encounter Medications as of 06/19/2021  ?Medication Sig  ? acetaminophen (TYLENOL) 325 MG tablet Take 2 tablets (650 mg total) by mouth every 6 (six) hours as needed for mild pain (or Fever >/= 101).  ? Alcohol Swabs (ALCOHOL PREP) PADS Use twice daily prior to SQ injection of insulin to clean skin  ? atorvastatin (LIPITOR) 20 MG tablet Take 1 tablet (20 mg total) by mouth at bedtime.  ? Calcium Carbonate-Vitamin D 600-400 MG-UNIT tablet Take 1 tablet by mouth 2 (two) times daily.  ? clobetasol (TEMOVATE) 0.05 % external solution Apply 1 application. topically 2 (two) times daily.  ? Cyanocobalamin (B-12) 1000 MCG CAPS Take 1,000 mcg by mouth daily.   ? furosemide (LASIX) 20 MG tablet Take 20 mg by mouth daily.  ? hydrOXYzine (ATARAX) 25 MG tablet TAKE 1 TABLET BY MOUTH THREE TIMES A DAY AS NEEDED  ? Insulin Pen Needle (TRUEPLUS PEN NEEDLES) 31G X 6 MM MISC Use with lantus two times daily.  ? INSULIN SYRINGE 1CC/29G 29G X 1/2" 1 ML MISC For lantus  injections twice daily  ? LANTUS SOLOSTAR 100 UNIT/ML Solostar Pen Inject 20 Units into the skin at bedtime.  ? losartan (COZAAR) 25 MG tablet TAKE 1 TABLET BY MOUTH EVERY DAY  ? metoprolol succinate (TOPROL-XL) 25 MG 24 hr tablet Take 1 tablet (25 mg total) by mouth 2 (two)  times daily.  ? Multiple Vitamins-Minerals (PRESERVISION AREDS 2 PO) Take 1 tablet by mouth in the morning and at bedtime.  ? Omega-3 1000 MG CAPS Take 1,000 mg by mouth daily.  ? omeprazole (PRILOSEC) 40 MG capsule TAKE 1 CAPSULE BY MOUTH EVERY DAY  ? OneTouch Delica Lancets 77A MISC To check blood sugar daily  DX: E11.9  ? ONETOUCH VERIO test strip TO CHECK BLOOD SUGAR ONCE DAILY.  ? oxybutynin (DITROPAN-XL) 10 MG 24 hr tablet TAKE 1 TABLET BY MOUTH EVERYDAY AT BEDTIME  ? oxyCODONE (OXY IR/ROXICODONE) 5 MG immediate release tablet Take 1 tablet (5 mg total) by mouth every 4 (four) hours as needed for severe pain.  ? potassium chloride (KLOR-CON) 10 MEQ tablet TAKE 1 TABLET BY MOUTH EVERY DAY  ? sertraline (ZOLOFT) 25 MG tablet Take 1 tablet (25 mg total) by mouth daily.  ? traZODone (DESYREL) 100 MG tablet Take 0.5 tablets (50 mg total) by mouth at bedtime.  ? TRULICITY 1.5 JO/8.7OM SOPN INJECT 1.'5MG'$  INTO THE SKIN ONCE A WEEK AS DIRECTED  ? vitamin C (ASCORBIC ACID) 500 MG tablet Take 500 mg by mouth 2 (two) times daily.  ? Vitamin D, Ergocalciferol, (DRISDOL) 1.25 MG (50000 UNIT) CAPS capsule TAKE ONE CAPSULE BY MOUTH ONCE WEEKLY ON THE SAME DAY EACH WEEK (ON FRIDAYS) (Patient taking differently: Take 50,000 Units by mouth every 7 (seven) days.)  ? zinc gluconate 50 MG tablet Take 50 mg by mouth daily.  ? ?No facility-administered encounter medications on file as of 06/19/2021.  ? ?Care Gaps: ?Tetanus/TDAP ?Dexa Scan ?Diabetic Foot Exam ?A1C  > 9 ? ?Star Rating Drugs: ?Atorvastatin 20 mg last filled on 04/28/2021 for a 90-Day supply with [facility] ?Losartan 25 mg last filled on 06/09/2021 for a 90-Day supply with [facility] ?Trulicity 1.5 mg last filled on 06/18/2021 for a 28-Day supply with [facility] ? ?Questions for Clinical Pharmacist:  ? ?1.Are you able to connect with Patient Yes ?   ?2.Confirmed appointment date/time with patient/caregiver? Confirm appointment on 06/23/2021 at 0900 with Daron Offer CPP ?   ?3.Visit type telephone ?   ?4.Patient/Caregiver instructed to bring medications to appointment. Patient is aware to bring all medications and supplements to appointment ?  ?5.What, if any, problems do you have getting your medications from the pharmacy?  Patient stated that her Trulicity is expensive for a 30-Day supply she paid 767.20 and the Trulicity does cause her to go into the doughnut hole and Financial barriers. [facility] and her only complain is that they used to call her and let her know what medications are available for pickup and now they don't do that anymore. Per patient she has to call them, and she stated she has mentioned the issue to them but so far it has not been corrected. ?   ?6.What is your top health concern to discuss at your upcoming visit? Patient stated her A1C is elevated, and she has been experiencing some hair loss. She reports the hair loss issue is improving, but she is still loosing hair. She does state she is stressed due to some family issues at this time. ?  ?  7.Have you seen any other providers since your last visit? No ? ?Per patient she does check her blood sugars every morning prior to her breakfast. Per patient she does have a blood pressure monitor but she does not check her blood pressure often. Per patient her blood pressure is always good when it's checked by her home Physical Therapist that comes in weekly or at her provider appointments. ? ?Patient reports that she tries to watch her sugar and carbohydrate intake, but it's sometimes hard for her to understand the labels. Patient mainly uses a wheel chair, but she does have a walker. Per patient she is currently doing weekly PT to assist her with walking better.  ? ?Lynann Bologna, CPA/CMA ?Clinical Pharmacist Assistant ?Phone: [phone]  ? ? ? ?  ?  ?  ?

## 2021-06-23 ENCOUNTER — Telehealth: Payer: Self-pay

## 2021-06-23 ENCOUNTER — Ambulatory Visit (INDEPENDENT_AMBULATORY_CARE_PROVIDER_SITE_OTHER): Payer: Medicare Other

## 2021-06-23 ENCOUNTER — Telehealth: Payer: Self-pay | Admitting: Family Medicine

## 2021-06-23 DIAGNOSIS — I10 Essential (primary) hypertension: Secondary | ICD-10-CM

## 2021-06-23 DIAGNOSIS — F5101 Primary insomnia: Secondary | ICD-10-CM

## 2021-06-23 DIAGNOSIS — E785 Hyperlipidemia, unspecified: Secondary | ICD-10-CM

## 2021-06-23 DIAGNOSIS — I5022 Chronic systolic (congestive) heart failure: Secondary | ICD-10-CM

## 2021-06-23 DIAGNOSIS — I25118 Atherosclerotic heart disease of native coronary artery with other forms of angina pectoris: Secondary | ICD-10-CM

## 2021-06-23 DIAGNOSIS — E7800 Pure hypercholesterolemia, unspecified: Secondary | ICD-10-CM

## 2021-06-23 DIAGNOSIS — I739 Peripheral vascular disease, unspecified: Secondary | ICD-10-CM

## 2021-06-23 DIAGNOSIS — M810 Age-related osteoporosis without current pathological fracture: Secondary | ICD-10-CM

## 2021-06-23 DIAGNOSIS — F32A Depression, unspecified: Secondary | ICD-10-CM

## 2021-06-23 DIAGNOSIS — E559 Vitamin D deficiency, unspecified: Secondary | ICD-10-CM | POA: Diagnosis not present

## 2021-06-23 NOTE — Progress Notes (Signed)
? ?Chronic Care Management ?Pharmacy Note ? ?07/06/2021 ?Name:  Andrea [name] MRN:  [id number] DOB:  [birth year] ? ?Summary: ?Patient presents for initial CCM consult.  ? ?Recommendations/Changes made from today's visit: ?-Increase Lantus to 25 units daily  ?-Start PAP for Trulicity  ? ?-Recommend rechecking Vitamin D, DEXA ? ?Plan: ?CPP follow-up 1 month ? ? ?Subjective: ?Andrea [name] is an 76 y.o. year old female who is a primary patient of Andrea Sprout, FNP.  The CCM team was consulted for assistance with disease management and care coordination needs.   ? ?Engaged with patient by telephone for initial visit in response to provider referral for pharmacy case management and/or care coordination services.  ? ?Consent to Services:  ?The patient was given the following information about Chronic Care Management services today, agreed to services, and gave verbal consent: 1. CCM service includes personalized support from designated clinical staff supervised by the primary care provider, including individualized plan of care and coordination with other care providers 2. 24/7 contact phone numbers for assistance for urgent and routine care needs. 3. Service will only be billed when office clinical staff spend 20 minutes or more in a month to coordinate care. 4. Only one practitioner may furnish and bill the service in a calendar month. 5.The patient may stop CCM services at any time (effective at the end of the month) by phone call to the office staff. 6. The patient will be responsible for cost sharing (co-pay) of up to 20% of the service fee (after annual deductible is met). Patient agreed to services and consent obtained. ? ?Patient Care Team: ?Andrea Sprout, FNP as PCP - General (Family Medicine) ?Andrea Merritts, MD as PCP - Cardiology (Cardiology) ?Andrea Cotta, MD as Consulting Physician (Ophthalmology) ?Andrea Pies, MD as Consulting Physician (Neurosurgery) ?Andrea Huxley, MD as Referring  Physician (Vascular Surgery) ?Andrea Bellows, MD as Consulting Physician (Gastroenterology) ?Andrea Labella, RN as Case Manager ?Andrea [name], [facility] (Pharmacist) ? ?Recent office visits: ?06/02/2021 Andrea Joe, FNP (PCP Office Visit) for Type II DM- Stopped: Amoxicillin due to patient not taking, Started: Clobetasol Propionate 5.46% 1 application daily, Lab orders placed, Referral for Dermatology placed, Patient to follow-up in 4 months ?  ?04/02/2021 Andrea Joe, FNP (PCP Office Visit) for Medication Refill- No medication changes noted, No orders placed, Patient to follow-up in 4 weeks ?  ?02/24/2021 Andrea Joe, FNP (PCP Office Visit) for Type II DM- Stopped: Doxycycline 100 mg due to patient not taking, Changed: Insulin Glargine 14 units daily to 20 units daily, Lab order placed, Patient to follow-up in 3 months ?  ?01/23/2021 Andrea Joe, FNP (PCP Office Visit) for Hospital Discharge Follow-up- Changed: Trazodone HCl 100 mg daily to 50 mg daily, No orders placed, Patient to follow-up in 4 weeks ? ?Recent consult visits: ?05/26/2021 Andrea [name], DPM (Podiatry) for Diabetic Ulcer- No medication changes noted, No orders placed, Patient to follow-up as needed ?  ?04/28/2021 Andrea Rogue, MD (Cardiology) for 1 year follow-up- Changed: Metoprolol Succinate 25 mg 1 tablet daily to twice daily, EKG 12-Lead ordered, Patient to follow-up in 1 year ?  ?04/21/2021 Andrea [name], DPM (Podiatry) for Diabetic Ulcer- No medication changes noted, DG Foot Complete Left ordered, Patient to follow-up in 4 weeks ?  ?03/24/2021 Andrea [name], DPM (Podiatry) for Wound Check-No medication changes noted, DG Foot Complete Left ordered, Patient to follow-up in 4 weeks ?  ?02/24/2021  Andrea [name], DPM (Podiatry) for Follow-up- No medication changes noted, Wound Culture ordered,  Patient to follow-up in 4 weeks ?  ?01/27/2021  Andrea [name], DPM (Podiatry) for Routine Post Op-No medication changes noted, No orders placed, Patient to  follow-up in 4 weeks ?  ?01/27/2021 Andrea Billing, MD (Infectious Disease) for-Wound- No medication changes noted, Aerobic culture order placed, patient to follow-up as needed ?  ?12/30/2020 Andrea [name], DPM (Podiatry) for Routine Post Op- No medication changes noted, No orders placed, Patient to follow-up in 4 weeks ? ?Hospital visits: ?None in previous 6 months ? ? ?Objective: ? ?Lab Results  ?Component Value Date  ? CREATININE 1.1 06/03/2021  ? BUN 30 (H) 06/02/2021  ? EGFR 51 06/03/2021  ? GFRNONAA 46 (L) 12/22/2020  ? GFRAA 70 02/11/2020  ? NA 138 06/03/2021  ? K 5.0 06/03/2021  ? CALCIUM 9.4 06/03/2021  ? CO2 22 06/02/2021  ? GLUCOSE 243 (H) 06/02/2021  ? ? ?Lab Results  ?Component Value Date/Time  ? HGBA1C 11.3 06/03/2021 04:44 PM  ? HGBA1C 11.3 (A) 06/02/2021 01:59 PM  ? HGBA1C 11.3 (H) 06/02/2021 12:00 AM  ? HGBA1C 7.5 (A) 02/24/2021 01:26 PM  ? HGBA1C 7.5 (H) 11/17/2020 10:34 PM  ? HGBA1C 7.5 (H) 11/26/2013 04:14 AM  ? MICROALBUR 20 12/16/2016 03:10 PM  ?  ?Last diabetic Eye exam:  ?Lab Results  ?Component Value Date/Time  ? HMDIABEYEEXA No Retinopathy 04/08/2021 12:00 AM  ?  ?Last diabetic Foot exam: No results found for: HMDIABFOOTEX  ? ?Lab Results  ?Component Value Date  ? CHOL 136 06/03/2021  ? HDL 66 06/03/2021  ? [place] 41 06/03/2021  ? TRIG 178 (A) 06/03/2021  ? CHOLHDL 2.1 06/02/2021  ? ? ? ?  Latest Ref Rng & Units 06/03/2021  ?  4:44 PM 06/02/2021  ? 12:00 AM 12/18/2020  ? 10:22 AM  ?Hepatic Function  ?Total Protein 6.0 - 8.5 g/dL  6.7   6.3    ?Albumin 3.5 - 5.0 4.2      4.2   2.6    ?AST 13 - 35 '15      15   17    ' ?ALT 7 - 35 U/L '10      10   16    ' ?Alk Phosphatase 44 - 121 IU/L  144   130    ?Total Bilirubin 0.0 - 1.2 mg/dL  0.6   0.8    ?  ? This result is from an external source.  ? ? ?Lab Results  ?Component Value Date/Time  ? TSH 1.88 06/03/2021 04:44 PM  ? TSH 1.880 06/02/2021 12:00 AM  ? TSH 1.220 12/28/2018 04:26 PM  ? FREET4 1.33 06/02/2021 12:00 AM  ? ? ? ?  Latest Ref Rng &  Units 12/20/2020  ?  4:49 AM 12/19/2020  ?  5:45 AM 12/18/2020  ? 10:22 AM  ?CBC  ?WBC 4.0 - 10.5 K/uL 7.6   7.0   9.5    ?Hemoglobin 12.0 - 15.0 g/dL 10.5   10.4   11.4    ?Hematocrit 36.0 - 46.0 % 32.7   31.8   35.1    ?Platelets 150 - 400 K/uL 211   189   188    ? ? ?No results found for: VD25OH ? ?Clinical ASCVD: Yes  ?The ASCVD Risk score (Arnett DK, et al., 2019) failed to calculate for the following reasons: ?  The patient has a prior MI or stroke diagnosis   ? ? ?  06/30/2021  ?  1:04 PM 11/17/2020  ?  4:03 PM  06/05/2020  ? 11:20 AM  ?Depression screen PHQ 2/9  ?Decreased Interest 0 0 0  ?Down, Depressed, Hopeless 0 0 0  ?PHQ - 2 Score 0 0 0  ?Altered sleeping   0  ?Tired, decreased energy   0  ?Change in appetite   0  ?Feeling bad or failure about yourself    0  ?Trouble concentrating   0  ?Moving slowly or fidgety/restless   0  ?Suicidal thoughts   0  ?PHQ-9 Score   0  ?Difficult doing work/chores   Not difficult at all  ?  ?-Last DEXA Scan: 03/07/18  ? T-Score femoral neck: -3.0 ? T-Score total hip: NA ? T-Score lumbar spine: -0.4 ? T-Score forearm radius: -4.0 ? ?Social History  ? ?Tobacco Use  ?Smoking Status Former  ? Packs/day: 1.00  ? Years: 30.00  ? Pack years: 30.00  ? Types: Cigarettes  ? Quit date: 07/17/2012  ? Years since quitting: 8.9  ?Smokeless Tobacco Never  ?Tobacco Comments  ?    ? ?BP Readings from Last 3 Encounters:  ?06/02/21 115/71  ?04/28/21 (!) 120/50  ?02/24/21 (!) 131/59  ? ?Pulse Readings from Last 3 Encounters:  ?06/02/21 91  ?04/28/21 97  ?02/24/21 96  ? ?Wt Readings from Last 3 Encounters:  ?06/30/21 137 lb (62.1 kg)  ?06/02/21 137 lb 11.2 oz (62.5 kg)  ?04/28/21 135 lb 6 oz (61.4 kg)  ? ?BMI Readings from Last 3 Encounters:  ?06/30/21 26.76 kg/m?  ?06/02/21 26.89 kg/m?  ?04/28/21 26.44 kg/m?  ? ? ?Assessment/Interventions: Review of patient past medical history, allergies, medications, health status, including review of consultants reports, laboratory and other test data, was  performed as part of comprehensive evaluation and provision of chronic care management services.  ? ?SDOH:  (Social Determinants of Health) assessments and interventions performed: Yes ?SDOH Interventions   ? ?[place]

## 2021-06-23 NOTE — Telephone Encounter (Signed)
Copied from [place] [id number]. Topic: Medicare AWV ?>> Jun 23, 2021  2:43 PM Cher Nakai R wrote: ?Reason for CRM:  ?Left message for patient to call back and schedule Medicare Annual Wellness Visit (AWV) in office.  ? ?If unable to come into the office for AWV,  please offer to do virtually or by telephone. ? ?Last AWV: 07/09/2019 ? ?Please schedule at anytime with [facility] Health Advisor. ? ?30 minute appointment for Virtual or phone ?45 minute appointment for in office or Initial virtual/phone ? ?Any questions, please contact me at [phone] ?

## 2021-06-23 NOTE — Telephone Encounter (Signed)
Copied from [place] [phone]. Topic: General - Other ?>> Jun 23, 2021  4:13 PM Pawlus, Brayton Layman A wrote: ?Reason for CRM: Pt had appt earlier today at [facility] 4/18 and stated she recently received another call from the office, pt was not sure what it was regarding, please advise. ?

## 2021-06-24 DIAGNOSIS — E11319 Type 2 diabetes mellitus with unspecified diabetic retinopathy without macular edema: Secondary | ICD-10-CM | POA: Diagnosis not present

## 2021-06-24 DIAGNOSIS — N189 Chronic kidney disease, unspecified: Secondary | ICD-10-CM | POA: Diagnosis not present

## 2021-06-24 DIAGNOSIS — I5042 Chronic combined systolic (congestive) and diastolic (congestive) heart failure: Secondary | ICD-10-CM | POA: Diagnosis not present

## 2021-06-24 DIAGNOSIS — M1990 Unspecified osteoarthritis, unspecified site: Secondary | ICD-10-CM | POA: Diagnosis not present

## 2021-06-24 DIAGNOSIS — Z87440 Personal history of urinary (tract) infections: Secondary | ICD-10-CM | POA: Diagnosis not present

## 2021-06-24 DIAGNOSIS — N3281 Overactive bladder: Secondary | ICD-10-CM | POA: Diagnosis not present

## 2021-06-24 DIAGNOSIS — J449 Chronic obstructive pulmonary disease, unspecified: Secondary | ICD-10-CM | POA: Diagnosis not present

## 2021-06-24 DIAGNOSIS — K219 Gastro-esophageal reflux disease without esophagitis: Secondary | ICD-10-CM | POA: Diagnosis not present

## 2021-06-24 DIAGNOSIS — G4700 Insomnia, unspecified: Secondary | ICD-10-CM | POA: Diagnosis not present

## 2021-06-24 DIAGNOSIS — E1169 Type 2 diabetes mellitus with other specified complication: Secondary | ICD-10-CM | POA: Diagnosis not present

## 2021-06-24 DIAGNOSIS — E1165 Type 2 diabetes mellitus with hyperglycemia: Secondary | ICD-10-CM | POA: Diagnosis not present

## 2021-06-24 DIAGNOSIS — Z8616 Personal history of COVID-19: Secondary | ICD-10-CM | POA: Diagnosis not present

## 2021-06-24 DIAGNOSIS — M810 Age-related osteoporosis without current pathological fracture: Secondary | ICD-10-CM | POA: Diagnosis not present

## 2021-06-24 DIAGNOSIS — E785 Hyperlipidemia, unspecified: Secondary | ICD-10-CM | POA: Diagnosis not present

## 2021-06-24 DIAGNOSIS — M6281 Muscle weakness (generalized): Secondary | ICD-10-CM | POA: Diagnosis not present

## 2021-06-24 DIAGNOSIS — E1140 Type 2 diabetes mellitus with diabetic neuropathy, unspecified: Secondary | ICD-10-CM | POA: Diagnosis not present

## 2021-06-24 DIAGNOSIS — Z7984 Long term (current) use of oral hypoglycemic drugs: Secondary | ICD-10-CM | POA: Diagnosis not present

## 2021-06-24 DIAGNOSIS — M86172 Other acute osteomyelitis, left ankle and foot: Secondary | ICD-10-CM | POA: Diagnosis not present

## 2021-06-24 DIAGNOSIS — I2510 Atherosclerotic heart disease of native coronary artery without angina pectoris: Secondary | ICD-10-CM | POA: Diagnosis not present

## 2021-06-24 DIAGNOSIS — E1122 Type 2 diabetes mellitus with diabetic chronic kidney disease: Secondary | ICD-10-CM | POA: Diagnosis not present

## 2021-06-24 DIAGNOSIS — L89626 Pressure-induced deep tissue damage of left heel: Secondary | ICD-10-CM | POA: Diagnosis not present

## 2021-06-24 DIAGNOSIS — I130 Hypertensive heart and chronic kidney disease with heart failure and stage 1 through stage 4 chronic kidney disease, or unspecified chronic kidney disease: Secondary | ICD-10-CM | POA: Diagnosis not present

## 2021-06-24 DIAGNOSIS — K589 Irritable bowel syndrome without diarrhea: Secondary | ICD-10-CM | POA: Diagnosis not present

## 2021-06-25 ENCOUNTER — Telehealth: Payer: Self-pay

## 2021-06-25 NOTE — Progress Notes (Signed)
? ? ?Chronic Care Management ?Pharmacy Assistant  ? ?Name: Andrea [name]  MRN: [id number] DOB: March 21, 1945 ? ?Patient Assistance for Ozempic and [name] ? ?I received a task from [name], CPP to start the process for the patient to receive patient assistance for Ozempic and [name]. I will start the application process through [name] who provides assistance for both medications. ? ?Application started, and patient contacted to find out how she would like to receive the application for her to complete. I was unable to reach the patient so I had to leave a HIPAA compliant voicemail requesting the patient to return my call. ? ?I contacted the patient, and patient is requesting the application be mailed to her home. Patient would also like to see if we can include a return envelope so she can mail it back to the clinic as she has a hard time with transportation. ? ?Application e-mailed to CPP for mailing.  ? ?Medications: ?Outpatient Encounter Medications as of 06/25/2021  ?Medication Sig  ? Alcohol Swabs (ALCOHOL PREP) PADS Use twice daily prior to SQ injection of insulin to clean skin  ? atorvastatin (LIPITOR) 20 MG tablet Take 1 tablet (20 mg total) by mouth at bedtime.  ? Calcium Carbonate-Vitamin D 600-400 MG-UNIT tablet Take 1 tablet by mouth daily.  ? clobetasol (TEMOVATE) 0.05 % external solution Apply 1 application. topically 2 (two) times daily.  ? Cyanocobalamin (B-12) 1000 MCG CAPS Take 1,000 mcg by mouth daily.   ? furosemide (LASIX) 20 MG tablet Take 20 mg by mouth daily.  ? hydrOXYzine (ATARAX) 25 MG tablet TAKE 1 TABLET BY MOUTH THREE TIMES A DAY AS NEEDED  ? Insulin Pen Needle (TRUEPLUS PEN NEEDLES) 31G X 6 MM MISC Use with lantus two times daily.  ? INSULIN SYRINGE 1CC/29G 29G X 1/2" 1 ML MISC For lantus injections twice daily  ? LANTUS SOLOSTAR 100 UNIT/ML Solostar Pen Inject 20 Units into the skin at bedtime.  ? losartan (COZAAR) 25 MG tablet TAKE 1 TABLET BY MOUTH EVERY DAY  ? metoprolol  succinate (TOPROL-XL) 25 MG 24 hr tablet Take 1 tablet (25 mg total) by mouth 2 (two) times daily.  ? Multiple Vitamins-Minerals (PRESERVISION AREDS 2 PO) Take 1 tablet by mouth in the morning and at bedtime.  ? Omega-3 1000 MG CAPS Take 1,000 mg by mouth daily.  ? omeprazole (PRILOSEC) 40 MG capsule TAKE 1 CAPSULE BY MOUTH EVERY DAY  ? OneTouch Delica Lancets 62X MISC To check blood sugar daily  DX: E11.9  ? ONETOUCH VERIO test strip TO CHECK BLOOD SUGAR ONCE DAILY.  ? oxybutynin (DITROPAN-XL) 10 MG 24 hr tablet TAKE 1 TABLET BY MOUTH EVERYDAY AT BEDTIME  ? oxyCODONE (OXY IR/ROXICODONE) 5 MG immediate release tablet Take 1 tablet (5 mg total) by mouth every 4 (four) hours as needed for severe pain. (Patient not taking: Reported on 06/23/2021)  ? potassium chloride (KLOR-CON) 10 MEQ tablet TAKE 1 TABLET BY MOUTH EVERY DAY  ? sertraline (ZOLOFT) 25 MG tablet Take 1 tablet (25 mg total) by mouth daily.  ? TRULICITY 1.5 BM/8.4XL SOPN INJECT 1.'5MG'$  INTO THE SKIN ONCE A WEEK AS DIRECTED  ? Vitamin D, Ergocalciferol, (DRISDOL) 1.25 MG (50000 UNIT) CAPS capsule TAKE ONE CAPSULE BY MOUTH ONCE WEEKLY ON THE SAME DAY EACH WEEK (ON FRIDAYS) (Patient taking differently: Take 50,000 Units by mouth every 7 (seven) days.)  ? ?No facility-administered encounter medications on file as of 06/25/2021.  ? ? ?Lynann Bologna, CPA/CMA ?Clinical Pharmacist Assistant ?  Phone: [phone]  ? ?

## 2021-06-30 ENCOUNTER — Ambulatory Visit (INDEPENDENT_AMBULATORY_CARE_PROVIDER_SITE_OTHER): Payer: Medicare Other

## 2021-06-30 VITALS — Wt 137.0 lb

## 2021-06-30 DIAGNOSIS — Z0000 Encounter for general adult medical examination without abnormal findings: Secondary | ICD-10-CM | POA: Diagnosis not present

## 2021-06-30 NOTE — Progress Notes (Signed)
?Virtual Visit via Telephone Note ? ?I connected with  Andrea [name] on 06/30/21 at  1:00 PM EDT by telephone and verified that I am speaking with the correct person using two identifiers. ? ?Location: ?Patient: home ?Provider: [facility] ?Persons participating in the virtual visit: patient/Nurse Health Advisor ?  ?I discussed the limitations, risks, security and privacy concerns of performing an evaluation and management service by telephone and the availability of in person appointments. The patient expressed understanding and agreed to proceed. ? ?Interactive audio and video telecommunications were attempted between this nurse and patient, however failed, due to patient having technical difficulties OR patient did not have access to video capability.  We continued and completed visit with audio only. ? ?Some vital signs may be absent or patient reported.  ? ?Andrea David, LPN ? ?Subjective:  ? Andrea [name] is a 76 y.o. female who presents for Medicare Annual (Subsequent) preventive examination. ? ?Review of Systems    ? ?  ? ?   ?Objective:  ?  ?There were no vitals filed for this visit. ?There is no height or weight on file to calculate BMI. ? ? ?  12/18/2020  ?  9:00 PM 12/18/2020  ? 10:20 AM 12/01/2020  ?  4:12 PM 11/22/2020  ?  7:00 PM 11/20/2020  ?  3:35 PM 11/19/2020  ?  3:00 PM 08/19/2020  ?  9:06 AM  ?Advanced Directives  ?Does Patient Have a Medical Advance Directive? Yes Yes  Yes Yes No Yes  ?Type of Armed forces technical officer of [place] will  ?Does patient want to make changes to medical advance directive? No - Guardian declined No - Patient declined  No - Patient declined   No - Patient declined  ?Copy of [place] in Chart? Yes - validated most recent copy scanned in chart (See row information) No - copy requested  No - copy requested     ?Would patient like information on  creating a medical advance directive?  No - Patient declined    No - Patient declined   ? ? ?Current Medications (verified) ?Outpatient Encounter Medications as of 06/30/2021  ?Medication Sig  ? Alcohol Swabs (ALCOHOL PREP) PADS Use twice daily prior to SQ injection of insulin to clean skin  ? atorvastatin (LIPITOR) 20 MG tablet Take 1 tablet (20 mg total) by mouth at bedtime.  ? Calcium Carbonate-Vitamin D 600-400 MG-UNIT tablet Take 1 tablet by mouth daily.  ? clobetasol (TEMOVATE) 0.05 % external solution Apply 1 application. topically 2 (two) times daily.  ? Cyanocobalamin (B-12) 1000 MCG CAPS Take 1,000 mcg by mouth daily.   ? furosemide (LASIX) 20 MG tablet Take 20 mg by mouth daily.  ? hydrOXYzine (ATARAX) 25 MG tablet TAKE 1 TABLET BY MOUTH THREE TIMES A DAY AS NEEDED  ? Insulin Pen Needle (TRUEPLUS PEN NEEDLES) 31G X 6 MM MISC Use with lantus two times daily.  ? INSULIN SYRINGE 1CC/29G 29G X 1/2" 1 ML MISC For lantus injections twice daily  ? LANTUS SOLOSTAR 100 UNIT/ML Solostar Pen Inject 20 Units into the skin at bedtime.  ? losartan (COZAAR) 25 MG tablet TAKE 1 TABLET BY MOUTH EVERY DAY  ? metoprolol succinate (TOPROL-XL) 25 MG 24 hr tablet Take 1 tablet (25 mg total) by mouth 2 (two) times daily.  ? Multiple Vitamins-Minerals (PRESERVISION AREDS 2 PO) Take 1 tablet by mouth in  the morning and at bedtime.  ? Omega-3 1000 MG CAPS Take 1,000 mg by mouth daily.  ? omeprazole (PRILOSEC) 40 MG capsule TAKE 1 CAPSULE BY MOUTH EVERY DAY  ? OneTouch Delica Lancets 81O MISC To check blood sugar daily  DX: E11.9  ? ONETOUCH VERIO test strip TO CHECK BLOOD SUGAR ONCE DAILY.  ? oxybutynin (DITROPAN-XL) 10 MG 24 hr tablet TAKE 1 TABLET BY MOUTH EVERYDAY AT BEDTIME  ? oxyCODONE (OXY IR/ROXICODONE) 5 MG immediate release tablet Take 1 tablet (5 mg total) by mouth every 4 (four) hours as needed for severe pain. (Patient not taking: Reported on 06/23/2021)  ? potassium chloride (KLOR-CON) 10 MEQ tablet TAKE 1 TABLET BY  MOUTH EVERY DAY  ? sertraline (ZOLOFT) 25 MG tablet Take 1 tablet (25 mg total) by mouth daily.  ? TRULICITY 1.5 FB/5.1WC SOPN INJECT 1.'5MG'$  INTO THE SKIN ONCE A WEEK AS DIRECTED  ? Vitamin D, Ergocalciferol, (DRISDOL) 1.25 MG (50000 UNIT) CAPS capsule TAKE ONE CAPSULE BY MOUTH ONCE WEEKLY ON THE SAME DAY EACH WEEK (ON FRIDAYS) (Patient taking differently: Take 50,000 Units by mouth every 7 (seven) days.)  ? ?No facility-administered encounter medications on file as of 06/30/2021.  ? ? ?Allergies (verified) ?Chlorhexidine, Metformin and related, and Sulfa antibiotics  ? ?History: ?Past Medical History:  ?Diagnosis Date  ? Anxiety   ? Arthritis   ? CAD (coronary artery disease)   ? Chickenpox   ? Chronic systolic heart failure ([place])   ? COPD (chronic obstructive pulmonary disease) ([place])   ? mild-no inhalers  ? Coronary artery disease   ? 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 Depression   ? Diabetes mellitus type 2, uncomplicated ([place])   ? Diabetic retinopathy ([place])   ? Dupuytren contracture 2011  ? s/p surgery to LEFT hand  ? Frequent urinary tract infections   ? GERD (gastroesophageal reflux disease)   ? Grade I diastolic dysfunction   ? HTN (hypertension)   ? Hyperlipidemia, unspecified   ? Irritable bowel syndrome   ? Meningitis 11/2019  ? MI (myocardial infarction) ([place]) 1994  ? no stent  ? MRSA (methicillin resistant staph aureus) culture positive   ? Neuromuscular disorder ([place])   ? nerve pain in back and legs  ? Osteoporosis   ? Pneumonia 2021  ? RBBB   ? Sepsis ([place]) 11/2019  ? Skin cancer   ? TIA (transient ischemic attack) 2014  ? Vitamin D deficiency, unspecified   ? Wheezing   ? ?Past Surgical History:  ?Procedure Laterality Date  ? BACK SURGERY    ? lower due to spinal stenosis  ? BONE BIOPSY Left 11/20/2020  ? Procedure: BONE BIOPSY;  Surgeon: Edrick Kins, DPM;  Location: [facility] ORS;  Service: Podiatry;  Laterality: Left;  ? CARDIAC CATHETERIZATION  1994  ? CATARACT EXTRACTION, BILATERAL Bilateral   ?  COLONOSCOPY    ? x3  ? COLONOSCOPY WITH PROPOFOL N/A 02/08/2019  ? Procedure: COLONOSCOPY WITH PROPOFOL;  Surgeon: [name], MD;  Location[facility];  Service: Gastroenterology;  Laterality: N/A;  ? CORONARY ANGIOPLASTY  1994  ? DUPUYTREN CONTRACTURE RELEASE Left 2011  ? HARDWARE REMOVAL Left 08/19/2020  ? Procedure: HARDWARE REMOVAL;  Surgeon: Hessie Knows, MD;  Location: [facility] ORS;  Service: Orthopedics;  Laterality: Left;  ? HIP FRACTURE SURGERY  11/2013  ? IRRIGATION AND DEBRIDEMENT FOOT Left 11/20/2020  ? Procedure: IRRIGATION AND DEBRIDEMENT FOOT-Heel Ulcer;  Surgeon: Edrick Kins, DPM;  Location: [facility] ORS;  Service: Podiatry;  Laterality: Left;  ? PR COLSC FLX W/REMOVAL LESION BY HOT BX FORCEPS   08/21/2015  ? Procedure: COLONOSCOPY, FLEXIBLE, PROXIMAL TO SPLENIC FLEXURE; W/REMOVAL TUMOR/POLYP/OTHER LESION, HOT BX FORCEP/CAUTE; Surgeon: Carlena Hurl, MD; Location: [facility] [place]; Service: General Surgery  ? REPAIR DURAL / CSF LEAK    ? to left sinus  ? TEE WITHOUT CARDIOVERSION N/A 11/20/2019  ? Procedure: TRANSESOPHAGEAL ECHOCARDIOGRAM (TEE);  Surgeon: Nelva Bush, MD;  Location: [facility] ORS;  Service: Cardiovascular;  Laterality: N/A;  ? TOTAL HIP ARTHROPLASTY Left 08/19/2020  ? Procedure: TOTAL HIP ARTHROPLASTY ANTERIOR APPROACH;  Surgeon: Hessie Knows, MD;  Location: [facility] ORS;  Service: Orthopedics;  Laterality: Left;  ? ?Family History  ?Problem Relation Age of Onset  ? Depression Daughter   ? Arthritis Daughter   ?     spine  ? Plantar fasciitis Daughter   ? Anxiety disorder Daughter   ? Hyperlipidemia Daughter   ? AAA (abdominal aortic aneurysm) Daughter   ? Hypertension Mother   ? Stroke Mother   ? Cataracts Mother   ? Depression Mother   ? Heart disease Mother   ? Asthma Brother   ? Diabetes Daughter   ? Hyperlipidemia Daughter   ? Hypertension Daughter   ? Breast cancer Cousin   ?     maternal  ? ?Social History  ? ?Socioeconomic History  ? Marital status: Married  ?  Spouse name:  Roselie Awkward  ? Number of children: 3  ? Years of education: 8  ? Highest education level: 8th grade  ?Occupational History  ? Occupation: retired  ?Tobacco Use  ? Smoking status: Former  ?  Packs/day: 1.00  ?  Years: 68.

## 2021-06-30 NOTE — Patient Instructions (Signed)
Andrea [name] , ?Thank you for taking time to come for your Medicare Wellness Visit. I appreciate your ongoing commitment to your health goals. Please review the following plan we discussed and let me know if I can assist you in the future.  ? ?Screening recommendations/referrals: ?Colonoscopy: aged out ?Mammogram: aged out ?Bone Density: declined referral for now ?Recommended yearly ophthalmology/optometry visit for glaucoma screening and checkup ?Recommended yearly dental visit for hygiene and checkup ? ?Vaccinations: ?Influenza vaccine: 12/01/20  ?Pneumococcal vaccine: 06/02/21, needs second shot ?Tdap vaccine: 03/08/13 ?Shingles vaccine: Shingrix 05/07/18, 11/21/18   ?Covid-19:07/14/19, 08/14/19, 01/27/21 ? ?Advanced directives: yes ? ?Conditions/risks identified: none ? ?Next appointment: Follow up in one year for your annual wellness visit 07/05/22 @ 1:30pm by phone ? ? ?Preventive Care 76 Years and Older, Female ?Preventive care refers to lifestyle choices and visits with your health care provider that can promote health and wellness. ?What does preventive care include? ?A yearly physical exam. This is also called an annual well check. ?Dental exams once or twice a year. ?Routine eye exams. Ask your health care provider how often you should have your eyes checked. ?Personal lifestyle choices, including: ?Daily care of your teeth and gums. ?Regular physical activity. ?Eating a healthy diet. ?Avoiding tobacco and drug use. ?Limiting alcohol use. ?Practicing safe sex. ?Taking low-dose aspirin every day. ?Taking vitamin and mineral supplements as recommended by your health care provider. ?What happens during an annual well check? ?The services and screenings done by your health care provider during your annual well check will depend on your age, overall health, lifestyle risk factors, and family history of disease. ?Counseling  ?Your health care provider may ask you questions about your: ?Alcohol use. ?Tobacco use. ?Drug  use. ?Emotional well-being. ?Home and relationship well-being. ?Sexual activity. ?Eating habits. ?History of falls. ?Memory and ability to understand (cognition). ?Work and work Statistician. ?Reproductive health. ?Screening  ?You may have the following tests or measurements: ?Height, weight, and BMI. ?Blood pressure. ?Lipid and cholesterol levels. These may be checked every 5 years, or more frequently if you are over 98 years old. ?Skin check. ?Lung cancer screening. You may have this screening every year starting at age 21 if you have a 30-pack-year history of smoking and currently smoke or have quit within the past 15 years. ?Fecal occult blood test (FOBT) of the stool. You may have this test every year starting at age 79. ?Flexible sigmoidoscopy or colonoscopy. You may have a sigmoidoscopy every 5 years or a colonoscopy every 10 years starting at age 63. ?Hepatitis C blood test. ?Hepatitis B blood test. ?Sexually transmitted disease (STD) testing. ?Diabetes screening. This is done by checking your blood sugar (glucose) after you have not eaten for a while (fasting). You may have this done every 1-3 years. ?Bone density scan. This is done to screen for osteoporosis. You may have this done starting at age 36. ?Mammogram. This may be done every 1-2 years. Talk to your health care provider about how often you should have regular mammograms. ?Talk with your health care provider about your test results, treatment options, and if necessary, the need for more tests. ?Vaccines  ?Your health care provider may recommend certain vaccines, such as: ?Influenza vaccine. This is recommended every year. ?Tetanus, diphtheria, and acellular pertussis (Tdap, Td) vaccine. You may need a Td booster every 10 years. ?Zoster vaccine. You may need this after age 60. ?Pneumococcal 13-valent conjugate (PCV13) vaccine. One dose is recommended after age 27. ?Pneumococcal polysaccharide (PPSV23) vaccine. One dose  is recommended after age  42. ?Talk to your health care provider about which screenings and vaccines you need and how often you need them. ?This information is not intended to replace advice given to you by your health care provider. Make sure you discuss any questions you have with your health care provider. ?Document Released: 03/21/2015 Document Revised: 11/12/2015 Document Reviewed: 12/24/2014 ?Elsevier Interactive Patient Education ? 2017 [place]. ? ?Fall Prevention in the Home ?Falls can cause injuries. They can happen to people of all ages. There are many things you can do to make your home safe and to help prevent falls. ?What can I do on the outside of my home? ?Regularly fix the edges of walkways and driveways and fix any cracks. ?Remove anything that might make you trip as you walk through a door, such as a raised step or threshold. ?Trim any bushes or trees on the path to your home. ?Use bright outdoor lighting. ?Clear any walking paths of anything that might make someone trip, such as rocks or tools. ?Regularly check to see if handrails are loose or broken. Make sure that both sides of any steps have handrails. ?Any raised decks and porches should have guardrails on the edges. ?Have any leaves, snow, or ice cleared regularly. ?Use sand or salt on walking paths during winter. ?Clean up any spills in your garage right away. This includes oil or grease spills. ?What can I do in the bathroom? ?Use night lights. ?Install grab bars by the toilet and in the tub and shower. Do not use towel bars as grab bars. ?Use non-skid mats or decals in the tub or shower. ?If you need to sit down in the shower, use a plastic, non-slip stool. ?Keep the floor dry. Clean up any water that spills on the floor as soon as it happens. ?Remove soap buildup in the tub or shower regularly. ?Attach bath mats securely with double-sided non-slip rug tape. ?Do not have throw rugs and other things on the floor that can make you trip. ?What can I do in the  bedroom? ?Use night lights. ?Make sure that you have a light by your bed that is easy to reach. ?Do not use any sheets or blankets that are too big for your bed. They should not hang down onto the floor. ?Have a firm chair that has side arms. You can use this for support while you get dressed. ?Do not have throw rugs and other things on the floor that can make you trip. ?What can I do in the kitchen? ?Clean up any spills right away. ?Avoid walking on wet floors. ?Keep items that you use a lot in easy-to-reach places. ?If you need to reach something above you, use a strong step stool that has a grab bar. ?Keep electrical cords out of the way. ?Do not use floor polish or wax that makes floors slippery. If you must use wax, use non-skid floor wax. ?Do not have throw rugs and other things on the floor that can make you trip. ?What can I do with my stairs? ?Do not leave any items on the stairs. ?Make sure that there are handrails on both sides of the stairs and use them. Fix handrails that are broken or loose. Make sure that handrails are as long as the stairways. ?Check any carpeting to make sure that it is firmly attached to the stairs. Fix any carpet that is loose or worn. ?Avoid having throw rugs at the top or bottom of the stairs.  If you do have throw rugs, attach them to the floor with carpet tape. ?Make sure that you have a light switch at the top of the stairs and the bottom of the stairs. If you do not have them, ask someone to add them for you. ?What else can I do to help prevent falls? ?Wear shoes that: ?Do not have high heels. ?Have rubber bottoms. ?Are comfortable and fit you well. ?Are closed at the toe. Do not wear sandals. ?If you use a stepladder: ?Make sure that it is fully opened. Do not climb a closed stepladder. ?Make sure that both sides of the stepladder are locked into place. ?Ask someone to hold it for you, if possible. ?Clearly mark and make sure that you can see: ?Any grab bars or  handrails. ?First and last steps. ?Where the edge of each step is. ?Use tools that help you move around (mobility aids) if they are needed. These include: ?Canes. ?Walkers. ?Scooters. ?Crutches. ?Turn on the lights when you go

## 2021-07-01 DIAGNOSIS — N189 Chronic kidney disease, unspecified: Secondary | ICD-10-CM | POA: Diagnosis not present

## 2021-07-01 DIAGNOSIS — E1165 Type 2 diabetes mellitus with hyperglycemia: Secondary | ICD-10-CM | POA: Diagnosis not present

## 2021-07-01 DIAGNOSIS — I2510 Atherosclerotic heart disease of native coronary artery without angina pectoris: Secondary | ICD-10-CM | POA: Diagnosis not present

## 2021-07-01 DIAGNOSIS — M810 Age-related osteoporosis without current pathological fracture: Secondary | ICD-10-CM | POA: Diagnosis not present

## 2021-07-01 DIAGNOSIS — L89626 Pressure-induced deep tissue damage of left heel: Secondary | ICD-10-CM | POA: Diagnosis not present

## 2021-07-01 DIAGNOSIS — M1990 Unspecified osteoarthritis, unspecified site: Secondary | ICD-10-CM | POA: Diagnosis not present

## 2021-07-01 DIAGNOSIS — Z8616 Personal history of COVID-19: Secondary | ICD-10-CM | POA: Diagnosis not present

## 2021-07-01 DIAGNOSIS — I130 Hypertensive heart and chronic kidney disease with heart failure and stage 1 through stage 4 chronic kidney disease, or unspecified chronic kidney disease: Secondary | ICD-10-CM | POA: Diagnosis not present

## 2021-07-01 DIAGNOSIS — N3281 Overactive bladder: Secondary | ICD-10-CM | POA: Diagnosis not present

## 2021-07-01 DIAGNOSIS — M6281 Muscle weakness (generalized): Secondary | ICD-10-CM | POA: Diagnosis not present

## 2021-07-01 DIAGNOSIS — M86172 Other acute osteomyelitis, left ankle and foot: Secondary | ICD-10-CM | POA: Diagnosis not present

## 2021-07-01 DIAGNOSIS — E785 Hyperlipidemia, unspecified: Secondary | ICD-10-CM | POA: Diagnosis not present

## 2021-07-01 DIAGNOSIS — G4700 Insomnia, unspecified: Secondary | ICD-10-CM | POA: Diagnosis not present

## 2021-07-01 DIAGNOSIS — E1122 Type 2 diabetes mellitus with diabetic chronic kidney disease: Secondary | ICD-10-CM | POA: Diagnosis not present

## 2021-07-01 DIAGNOSIS — K219 Gastro-esophageal reflux disease without esophagitis: Secondary | ICD-10-CM | POA: Diagnosis not present

## 2021-07-01 DIAGNOSIS — Z87440 Personal history of urinary (tract) infections: Secondary | ICD-10-CM | POA: Diagnosis not present

## 2021-07-01 DIAGNOSIS — E11319 Type 2 diabetes mellitus with unspecified diabetic retinopathy without macular edema: Secondary | ICD-10-CM | POA: Diagnosis not present

## 2021-07-01 DIAGNOSIS — Z7984 Long term (current) use of oral hypoglycemic drugs: Secondary | ICD-10-CM | POA: Diagnosis not present

## 2021-07-01 DIAGNOSIS — J449 Chronic obstructive pulmonary disease, unspecified: Secondary | ICD-10-CM | POA: Diagnosis not present

## 2021-07-01 DIAGNOSIS — E1140 Type 2 diabetes mellitus with diabetic neuropathy, unspecified: Secondary | ICD-10-CM | POA: Diagnosis not present

## 2021-07-01 DIAGNOSIS — I5042 Chronic combined systolic (congestive) and diastolic (congestive) heart failure: Secondary | ICD-10-CM | POA: Diagnosis not present

## 2021-07-01 DIAGNOSIS — E1169 Type 2 diabetes mellitus with other specified complication: Secondary | ICD-10-CM | POA: Diagnosis not present

## 2021-07-01 DIAGNOSIS — K589 Irritable bowel syndrome without diarrhea: Secondary | ICD-10-CM | POA: Diagnosis not present

## 2021-07-05 DIAGNOSIS — E785 Hyperlipidemia, unspecified: Secondary | ICD-10-CM

## 2021-07-05 DIAGNOSIS — M810 Age-related osteoporosis without current pathological fracture: Secondary | ICD-10-CM | POA: Diagnosis not present

## 2021-07-05 DIAGNOSIS — E1169 Type 2 diabetes mellitus with other specified complication: Secondary | ICD-10-CM

## 2021-07-05 DIAGNOSIS — F32A Depression, unspecified: Secondary | ICD-10-CM | POA: Diagnosis not present

## 2021-07-05 DIAGNOSIS — I5022 Chronic systolic (congestive) heart failure: Secondary | ICD-10-CM

## 2021-07-05 DIAGNOSIS — E7800 Pure hypercholesterolemia, unspecified: Secondary | ICD-10-CM

## 2021-07-05 DIAGNOSIS — I25118 Atherosclerotic heart disease of native coronary artery with other forms of angina pectoris: Secondary | ICD-10-CM | POA: Diagnosis not present

## 2021-07-05 DIAGNOSIS — I10 Essential (primary) hypertension: Secondary | ICD-10-CM

## 2021-07-06 ENCOUNTER — Telehealth: Payer: Self-pay

## 2021-07-06 NOTE — Progress Notes (Signed)
? ? ?  Chronic Care Management ?Pharmacy Assistant  ? ?Name: DAVIONNE MASTRANGELO  MRN: [id number] DOB: December 25, 1945 ? ?Patient called to be reminded of her telephone appointment with [name], CPP on 07/07/2021 @ 1015. ? ?No answer, left message of appointment date, time and type of appointment (either telephone or in person). Left message to have all medications, supplements, blood pressure and/or blood sugar logs available during appointment and to return call if need to reschedule. ? ?Care Gaps: ?Tetanus/TDAP ?Dexa Scan ?Diabetic Foot Exam ?A1C  > 9 ?  ?Star Rating Drugs: ?Atorvastatin 20 mg last filled on 04/28/2021 for a 90-Day supply with [facility] ?Losartan 25 mg last filled on 06/09/2021 for a 90-Day supply with [facility] ?Trulicity 1.5 mg last filled on 06/18/2021 for a 28-Day supply with [facility] ? ?Any gaps in medications fill history? No ? ?Lynann Bologna, CPA/CMA ?Clinical Pharmacist Assistant ?Phone: [phone]  ? ?

## 2021-07-06 NOTE — Patient Instructions (Signed)
Visit Information ?It was great speaking with you today!  Please let me know if you have any questions about our visit. ? ? Goals Addressed   ? ?  ?  ?  ?  ? This Visit's Progress  ?  Monitor and Manage My Blood Sugar-Diabetes Type 2     ?  Timeframe:  Long-Range Goal ?Priority:  High ?Start Date: 06/23/2021                            ?Expected End Date: 07/07/2022                     ? ?Follow Up within 90 days ?  ?- check blood sugar once daily before breakfast ?- check blood sugar if I feel it is too high or too low ?- enter blood sugar readings and medication or insulin into daily log ?- take the blood sugar log to all doctor visits  ?  ?Why is this important?   ?Checking your blood sugar at home helps to keep it from getting very high or very low.  ?Writing the results in a diary or log helps the doctor know how to care for you.  ?Your blood sugar log should have the time, date and the results.  ?Also, write down the amount of insulin or other medicine that you take.  ?Other information, like what you ate, exercise done and how you were feeling, will also be helpful.   ?  ?Notes:  ?  ? ?  ? ? ?Patient Care Plan: Fall Risk (Adult)  ?  ? ?Problem Identified: Fall Risk   ?  ? ?Long-Range Goal: Absence of Fall and Fall-Related Injury   ?Start Date: 11/17/2020  ?Expected End Date: 03/17/2021  ?Priority: High  ?Note:   ?Fall Risk  11/17/2020 06/05/2020 07/09/2019 12/28/2018 12/19/2017  ?Falls in the past year? 0 1 0 0 No  ?Number falls in past yr: 0 1 0 0 -  ?Injury with Fall? 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 ?Current Barriers:  ?High Risk for Falls d/t  Impaired Gait ? ?Clinical Goal(s):  ?Over the next 120  days, patient will not experience falls or require hospitalization d/t fall related injuries. ? ?Interventions:  ?Collaboration with [name], FNP regarding development and update of comprehensive plan of care as evidenced by provider attestation and co-signature ?Inter-disciplinary care team collaboration (see longitudinal plan of care) ?Reviewed medications and discussed potential side effects that may increase risk for falls. ?Provided information regarding safety and fall prevention. Reports currently being unable to bear weight on her left extremity but has a rolling walker to use if needed. Uses a wheelchair outside of the home. Currently receiving Home Health physical and occupational therapy services. Receiving nursing services for dressing changes to wound on her left heel. Reports following activity restrictions as recommended by the physical therapy team. ?Discussed ability to perform ADL's and tasks in the home. Unable to perform several tasks in the home but reports performing ADL's independently. Reports a daughter is available to assist in the home as needed. Declined need for additional in-home assistance. She voiced concerns  regarding wheelchair accessible transportation for two pending appointments.  ?Referral placed for transportation assistance. ? ?Self-Care/Patient Goals:  ?Utilize assistive device appropriately with all ambulation ?Ensure pathways are clear and well lit ?Change positions slowly and use caution when ambulating ?Wear secure fitting, skid free footwear when ambulating ?Follow activity restrictions as advised by the Home Health Physical Therapy team ?Notify provider or care management team for questions and new concerns as needed ? ? ? ? ?Follow Up Plan:  ?Will follow up next month ?  ? ?Patient Care Plan: COPD (Adult)  ?  ? ?Problem Identified: Symptom Exacerbation (COPD)   ?  ? ?Patient Care Plan: Hypertension (Adult)  ?  ? ?Problem Identified: Hypertension (Hypertension)    ?  ? ?Long-Range Goal: Hypertension Monitored   ?Start Date: 11/17/2020  ?Expected End Date: 03/17/2021  ?Priority: Medium  ?Note:   ?Current Barriers:  ?Chronic Disease Management support and educational needs related to Hypertension ? ? ?Case Manager Clinical Goal(s):  ?Over the next 120 days, patient will demonstrate adherence to prescribed treatment plan for hypertension as evidenced by taking all medications as prescribed, monitoring and recording blood pressure and adhering to a low sodium/DASH diet. ? ? ?Interventions:  ?Collaboration with [name], FNP regarding development and update of comprehensive plan of care as evidenced by provider attestation and co-signature ?Inter-disciplinary care team collaboration (see longitudinal plan of care) ?Reviewed medications.  Advised to take all medications as prescribed. Advised to notify the care management team with concerns regarding medication management or prescription cost. ?Provided information regarding established blood pressure parameters along with indications for notifying a provider. Encouraged to monitor and record readings. Reports recent home readings have been within range. ?Discussed compliance with the recommended cardiac prudent diet. Encouraged to read nutrition labels, monitor sodium intake and avoid highly processed foods when possible. ?Reviewed s/sx of heart attack, stroke and worsening symptoms that require immediate medical attention. ? ? ?Patient Goals/Self-Care Activities: ?Self-administer medications as prescribed ?Monitor and record blood pressure ?Adhere to recommended cardiac prudent/heart healthy diet ?Notify provider or care management team with questions and new concerns as needed ? ? ? ?Follow Up Plan:  ?Will follow up next month ?  ? ?Patient Care Plan: Diabetes Type 2 (Adult)  ?  ? ?Problem Identified: Glycemic Management (Diabetes, Type 2)   ?  ? ?Long-Range Goal: Glycemic Management Optimized   ?Start Date: 11/17/2020   ?Expected End Date: 03/17/2021  ?Priority: High  ?Note:   ? ?Current Barriers:  ?Chronic Disease Management support and educational needs related to Diabetes self management. ? ?Case Manager Clinical Goal(s):  ?Over the next 120 days, patient will demonstrate improved adherence to prescribed treatment plan for diabetes self care/management as evidenced by taking medications as prescribed, daily monitoring and recording of CBG's and adherence to an ADA/ carb modified diet.  ? ?Interventions:  ?Collaboration with [name], FNP regarding development and update of comprehensive plan of care as evidenced by provider attestation and co-signature ?Inter-disciplinary care team collaboration (see longitudinal plan of care) ?Reviewed medications and importance of compliance. Advised to take all medications as prescribed and notify the care management team with concerns regarding medication management or prescription cost. Will provide update if assistance is needed for Trulicity. ?Provided information regarding importance of consistent blood glucose monitoring. Encouraged to monitor and maintain a log. ?Reviewed s/sx of hypoglycemia and hyperglycemia along with recommended interventions. Reports readings have ranged in the 80's to 220's. Reports fasting reading today of 85 mg/dl.  ?Discussed nutritional  intake and importance of complying with a carb modified/DM diet. ?Discussed and offered referrals for available Diabetes education classes. Declined need for additional referrals. Will provide additional information regarding nutrition options for optimal glycemic control. ?Discussed importance of completing recommended DM preventive care. Reports exams are up to date. She will complete an annual eye exam later this year. Discussed importance of completing daily foot care. Discussed increase risk of delayed wound healing r/t diabetes. Advised to ensure dressing to left heel are completed as scheduled.   ? ? ?Patient  Goals/Self-Care Activities ?Self-administer medications as prescribed ?Attend all scheduled provider appointments ?Monitor blood glucose levels consistently and utilize recommended interventions ?Adhere to pres

## 2021-07-07 ENCOUNTER — Telehealth: Payer: Medicare Other

## 2021-07-07 NOTE — Progress Notes (Deleted)
Chronic Care Management Pharmacy Note  07/07/2021 Name:  Andrea [name] MRN:  [id number] DOB:  1945/12/06  Summary: Patient presents for initial CCM consult.   Recommendations/Changes made from today's visit: -Increase Lantus to 25 units daily  -Start PAP for Trulicity   -Recommend rechecking Vitamin D, DEXA  Plan: CPP follow-up 1 month   Subjective: Andrea [name] is an 76 y.o. year old female who is a primary patient of [name], FNP.  The CCM team was consulted for assistance with disease management and care coordination needs.    Engaged with patient by telephone for initial visit in response to provider referral for pharmacy case management and/or care coordination services.   Consent to Services:  The patient was given the following information about Chronic Care Management services today, agreed to services, and gave verbal consent: 1. CCM service includes personalized support from designated clinical staff supervised by the primary care provider, including individualized plan of care and coordination with other care providers 2. 24/7 contact phone numbers for assistance for urgent and routine care needs. 3. Service will only be billed when office clinical staff spend 20 minutes or more in a month to coordinate care. 4. Only one practitioner may furnish and bill the service in a calendar month. 5.The patient may stop CCM services at any time (effective at the end of the month) by phone call to the office staff. 6. The patient will be responsible for cost sharing (co-pay) of up to 20% of the service fee (after annual deductible is met). Patient agreed to services and consent obtained.  Patient Care Team: [name], FNP as PCP - General (Family Medicine) Minna Merritts, MD as PCP - Cardiology (Cardiology) Dingeldein, Remo Lipps, MD as Consulting Physician (Ophthalmology) Newman Pies, MD as Consulting Physician (Neurosurgery) Lucky Cowboy, Erskine Squibb, MD as Referring  Physician (Vascular Surgery) [name], MD as Consulting Physician (Gastroenterology) Neldon Labella, RN as Case Manager Germaine Pomfret, [facility] (Pharmacist)  Recent office visits: 06/02/2021 Andrea [name], [place] (PCP Office Visit) for Type II DM- Stopped: Amoxicillin due to patient not taking, Started: Clobetasol Propionate 0.09% 1 application daily, Lab orders placed, Referral for Dermatology placed, Patient to follow-up in 4 months   04/02/2021 Andrea [name], [place] (PCP Office Visit) for Medication Refill- No medication changes noted, No orders placed, Patient to follow-up in 4 weeks   02/24/2021 Andrea Joe, FNP (PCP Office Visit) for Type II DM- Stopped: Doxycycline 100 mg due to patient not taking, Changed: Insulin Glargine 14 units daily to 20 units daily, Lab order placed, Patient to follow-up in 3 months   01/23/2021 Andrea [name], [place] (PCP Office Visit) for Hospital Discharge Follow-up- Changed: Trazodone HCl 100 mg daily to 50 mg daily, No orders placed, Patient to follow-up in 4 weeks  Recent consult visits: 05/26/2021 Daylene Katayama, DPM (Podiatry) for Diabetic Ulcer- No medication changes noted, No orders placed, Patient to follow-up as needed   04/28/2021 Ida Rogue, MD (Cardiology) for 1 year follow-up- Changed: Metoprolol Succinate 25 mg 1 tablet daily to twice daily, EKG 12-Lead ordered, Patient to follow-up in 1 year   04/21/2021 Daylene Katayama, DPM (Podiatry) for Diabetic Ulcer- No medication changes noted, DG Foot Complete Left ordered, Patient to follow-up in 4 weeks   03/24/2021 Daylene Katayama, DPM (Podiatry) for Wound Check-No medication changes noted, DG Foot Complete Left ordered, Patient to follow-up in 4 weeks   02/24/2021  Daylene Katayama, DPM (Podiatry) for Follow-up- No medication changes noted, Wound Culture ordered,  Patient to follow-up in 4 weeks   01/27/2021  Daylene Katayama, DPM (Podiatry) for Routine Post Op-No medication changes noted, No orders placed, Patient to  follow-up in 4 weeks   01/27/2021 Tsosie Billing, MD (Infectious Disease) for-Wound- No medication changes noted, Aerobic culture order placed, patient to follow-up as needed   12/30/2020 Daylene Katayama, DPM (Podiatry) for Routine Post Op- No medication changes noted, No orders placed, Patient to follow-up in 4 weeks  Hospital visits: None in previous 6 months   Objective:  Lab Results  Component Value Date   CREATININE 1.1 06/03/2021   BUN 30 (H) 06/02/2021   EGFR 51 06/03/2021   GFRNONAA 46 (L) 12/22/2020   GFRAA 70 02/11/2020   NA 138 06/03/2021   K 5.0 06/03/2021   CALCIUM 9.4 06/03/2021   CO2 22 06/02/2021   GLUCOSE 243 (H) 06/02/2021    Lab Results  Component Value Date/Time   HGBA1C 11.3 06/03/2021 04:44 PM   HGBA1C 11.3 (A) 06/02/2021 01:59 PM   HGBA1C 11.3 (H) 06/02/2021 12:00 AM   HGBA1C 7.5 (A) 02/24/2021 01:26 PM   HGBA1C 7.5 (H) 11/17/2020 10:34 PM   HGBA1C 7.5 (H) 11/26/2013 04:14 AM   MICROALBUR 20 12/16/2016 03:10 PM    Last diabetic Eye exam:  Lab Results  Component Value Date/Time   HMDIABEYEEXA No Retinopathy 04/08/2021 12:00 AM    Last diabetic Foot exam: No results found for: HMDIABFOOTEX   Lab Results  Component Value Date   CHOL 136 06/03/2021   HDL 66 06/03/2021   LDLCALC 41 06/03/2021   TRIG 178 (A) 06/03/2021   CHOLHDL 2.1 06/02/2021       Latest Ref Rng & Units 06/03/2021    4:44 PM 06/02/2021   12:00 AM 12/18/2020   10:22 AM  Hepatic Function  Total Protein 6.0 - 8.5 g/dL  6.7   6.3    Albumin 3.5 - 5.0 4.2      4.2   2.6    AST 13 - 35 _0 ALT 7 - 35 U/L _1 Alk Phosphatase 44 - 121 IU/L  144   130    Total Bilirubin 0.0 - 1.2 mg/dL  0.6   0.8       This result is from an external source.    Lab Results  Component Value Date/Time   TSH 1.88 06/03/2021 04:44 PM   TSH 1.880 06/02/2021 12:00 AM   TSH 1.220 12/28/2018 04:26 PM   FREET4 1.33 06/02/2021 12:00 AM       Latest Ref Rng &  Units 12/20/2020    4:49 AM 12/19/2020    5:45 AM 12/18/2020   10:22 AM  CBC  WBC 4.0 - 10.5 K/uL 7.6   7.0   9.5    Hemoglobin 12.0 - 15.0 g/dL 10.5   10.4   11.4    Hematocrit 36.0 - 46.0 % 32.7   31.8   35.1    Platelets 150 - 400 K/uL 211   189   188      No results found for: VD25OH  Clinical ASCVD: Yes  The ASCVD Risk score (Arnett DK, et al., 2019) failed to calculate for the following reasons:   The patient has a prior MI or stroke diagnosis       06/30/2021    1:04 PM 11/17/2020    4:03 PM  06/05/2020   11:20 AM  Depression screen PHQ 2/9  Decreased Interest 0 0 0  Down, Depressed, Hopeless 0 0 0  PHQ - 2 Score 0 0 0  Altered sleeping   0  Tired, decreased energy   0  Change in appetite   0  Feeling bad or failure about yourself    0  Trouble concentrating   0  Moving slowly or fidgety/restless   0  Suicidal thoughts   0  PHQ-9 Score   0  Difficult doing work/chores   Not difficult at all    -Last DEXA Scan: 03/07/18   T-Score femoral neck: -3.0  T-Score total hip: NA  T-Score lumbar spine: -0.4  T-Score forearm radius: -4.0  Social History   Tobacco Use  Smoking Status Former   Packs/day: 1.00   Years: 30.00   Pack years: 30.00   Types: Cigarettes   Quit date: 07/17/2012   Years since quitting: 8.9  Smokeless Tobacco Never  Tobacco Comments       BP Readings from Last 3 Encounters:  06/02/21 115/71  04/28/21 (!) 120/50  02/24/21 (!) 131/59   Pulse Readings from Last 3 Encounters:  06/02/21 91  04/28/21 97  02/24/21 96   Wt Readings from Last 3 Encounters:  06/30/21 137 lb (62.1 kg)  06/02/21 137 lb 11.2 oz (62.5 kg)  04/28/21 135 lb 6 oz (61.4 kg)   BMI Readings from Last 3 Encounters:  06/30/21 26.76 kg/m  06/02/21 26.89 kg/m  04/28/21 26.44 kg/m    Assessment/Interventions: Review of patient past medical history, allergies, medications, health status, including review of consultants reports, laboratory and other test data, was  performed as part of comprehensive evaluation and provision of chronic care management services.   SDOH:  (Social Determinants of Health) assessments and interventions performed: Yes   SDOH Screenings   Alcohol Screen: Low Risk    Last Alcohol Screening Score (AUDIT): 0  Depression (PHQ2-9): Low Risk    PHQ-2 Score: 0  Financial Resource Strain: High Risk   Difficulty of Paying Living Expenses: Hard  Food Insecurity: No Food Insecurity   Worried About Charity fundraiser in the Last Year: Never true   Ran Out of Food in the Last Year: Never true  Housing: Low Risk    Last Housing Risk Score: 0  Physical Activity: Sufficiently Active   Days of Exercise per Week: 7 days   Minutes of Exercise per Session: 30 min  Social Connections: Moderately Isolated   Frequency of Communication with Friends and Family: More than three times a week   Frequency of Social Gatherings with Friends and Family: Once a week   Attends Religious Services: Never   Marine scientist or Organizations: No   Attends Music therapist: Never   Marital Status: Married  Stress: No Stress Concern Present   Feeling of Stress : Not at all  Tobacco Use: Medium Risk   Smoking Tobacco Use: Former   Smokeless Tobacco Use: Never   Passive Exposure: Not on Pensions consultant Needs: No Transportation Needs   Lack of Transportation (Medical): No   Lack of Transportation (Non-Medical): No    CCM Care Plan  Allergies  Allergen Reactions   Chlorhexidine    Metformin And Related Diarrhea   Sulfa Antibiotics Rash    Medications Reviewed Today     Reviewed by Dionisio David, LPN (Licensed Practical Nurse) on 06/30/21 at 1317  Med List Status: <None>  Medication Order Taking? Sig Documenting Provider Last Dose Status Informant  Alcohol Swabs (ALCOHOL PREP) PADS [id number] Yes Use twice daily prior to SQ injection of insulin to clean skin Mar Daring, PA-C Taking Active Nursing Home  Medication Administration Guide (MAG)  atorvastatin (LIPITOR) 20 MG tablet [id number] Yes Take 1 tablet (20 mg total) by mouth at bedtime. [name], FNP Taking Active   Calcium Carbonate-Vitamin D 600-400 MG-UNIT tablet [id number] Yes Take 1 tablet by mouth daily. [provider] Taking Active Nursing Home Medication Administration Guide (MAG)  clobetasol (TEMOVATE) 0.05 % external solution [id number] Yes Apply 1 application. topically 2 (two) times daily. [name], FNP Taking Active   Cyanocobalamin (B-12) 1000 MCG CAPS [id number] No Take 1,000 mcg by mouth daily.   Patient not taking: Reported on 06/30/2021   [provider] Not Taking Active Nursing Home Medication Administration Guide (MAG)  furosemide (LASIX) 20 MG tablet [id number] Yes Take 20 mg by mouth daily. [provider] Taking Active   hydrOXYzine (ATARAX) 25 MG tablet [id number] Yes TAKE 1 TABLET BY MOUTH THREE TIMES A DAY AS NEEDED [name], FNP Taking Active   Insulin Pen Needle (TRUEPLUS PEN NEEDLES) 31G X 6 MM MISC [id number] Yes Use with lantus two times daily. Virginia Crews, MD Taking Active Nursing Home Medication Administration Guide (MAG)  INSULIN SYRINGE 1CC/29G 29G X 1/2" 1 ML MISC [id number] Yes For lantus injections twice daily Mar Daring, PA-C Taking Active Nursing Home Medication Administration Guide (MAG)  LANTUS SOLOSTAR 100 UNIT/ML Solostar Pen [id number] Yes Inject 20 Units into the skin at bedtime. [name], FNP Taking Active   losartan (COZAAR) 25 MG tablet [id number] Yes TAKE 1 TABLET BY MOUTH EVERY DAY [name], FNP Taking Active   metoprolol succinate (TOPROL-XL) 25 MG 24 hr tablet [id number] Yes Take 1 tablet (25 mg total) by mouth 2 (two) times daily. Minna Merritts, MD Taking Active   Multiple Vitamins-Minerals (PRESERVISION AREDS 2 PO) [id number] Yes Take 1 tablet by mouth in the morning and at bedtime. [provider] Taking Active  Nursing Home Medication Administration Guide (MAG)  Omega-3 1000 MG CAPS [id number] Yes Take 1,000 mg by mouth daily. [provider] Taking Active Nursing Home Medication Administration Guide (MAG)  omeprazole (PRILOSEC) 40 MG capsule [id number] Yes TAKE 1 CAPSULE BY MOUTH EVERY DAY [name], FNP Taking Active   OneTouch Delica Lancets 44Y [place] [id number] Yes To check blood sugar daily  DX: E11.9 Virginia Crews, MD Taking Active Nursing Home Medication Administration Guide (MAG)  ONETOUCH VERIO test strip [id number] Yes TO CHECK BLOOD SUGAR ONCE DAILY. [name], FNP Taking Active   oxybutynin (DITROPAN-XL) 10 MG 24 hr tablet [id number] Yes TAKE 1 TABLET BY MOUTH EVERYDAY AT BEDTIME [name], FNP Taking Active   oxyCODONE (OXY IR/ROXICODONE) 5 MG immediate release tablet [id number] Yes Take 1 tablet (5 mg total) by mouth every 4 (four) hours as needed for severe pain. [name], FNP Taking Active   potassium chloride (KLOR-CON) 10 MEQ tablet [id number] Yes TAKE 1 TABLET BY MOUTH EVERY DAY [name], FNP Taking Active   sertraline (ZOLOFT) 25 MG tablet [id number] Yes Take 1 tablet (25 mg total) by mouth daily. [name], FNP Taking Active   TRULICITY 1.5 TK/3.5WS Bonney Aid [id number] Yes INJECT 1.5MG INTO THE SKIN ONCE A WEEK AS DIRECTED [name], FNP Taking Active   Vitamin D, Ergocalciferol, (DRISDOL) 1.25  MG (50000 UNIT) CAPS capsule [id number] Yes TAKE ONE CAPSULE BY MOUTH ONCE WEEKLY ON THE SAME DAY EACH WEEK (ON FRIDAYS)  Patient taking differently: Take 50,000 Units by mouth every 7 (seven) days.   Jerrol Banana., MD Taking Active Nursing Home Medication Administration Guide (MAG)  Med List Note Loann Quill, CPhT 12/18/20 1047): [employer] [phone]            Patient Active Problem List   Diagnosis Date Noted   Need for pneumococcal vaccination 06/02/2021   Hair loss 06/02/2021   Hyperlipidemia LDL goal <70 04/28/2021    Chronic heel pain, left 04/02/2021   MRSA (methicillin resistant staph aureus) culture positive 02/05/2021   Hospital discharge follow-up 01/23/2021   Weight loss, unintentional 01/23/2021   Primary insomnia 01/23/2021   Loss of appetite for more than 2 weeks 01/23/2021   Hypomagnesemia    Hypokalemia    Essential hypertension    Tachycardia    Depression    Acute metabolic encephalopathy 76/28/3151   Acute kidney injury superimposed on CKD ([place]) 12/18/2020   Delirium 76/16/0737   Chronic systolic CHF (congestive heart failure) ([place]) 12/18/2020   Osteomyelitis of foot, left, acute ([place]) 12/18/2020   Status post peripherally inserted central catheter (PICC) central line placement    Osteomyelitis ([place]) 11/17/2020   Cellulitis of left lower extremity 11/13/2020   Hypoglycemia 11/13/2020   Non healing left heel wound 11/13/2020   Status post total hip replacement, 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   Pain due to onychomycosis of toenail of left foot 03/06/2020   Malaise and fatigue 12/28/2019   UTI (urinary tract infection) 12/28/2019   Hyponatremia 12/27/2019   Intertrigo 12/27/2019   History of meningitis 11/23/2019   CSF leak from nose 11/22/2019   History of myocardial infarction 03/09/2019   PAD (peripheral artery disease) ([place]) 12/28/2018   Leg length discrepancy 09/21/2017   Coronary artery disease involving native coronary artery 01/06/2017   Drug-induced constipation 01/06/2017   Spinal stenosis of lumbar region 01/06/2017   Spondylolisthesis of lumbar region 12/30/2016   Obese 04/16/2015   History of colon polyps 11/23/2012   Bundle branch block, 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   Personal history of transient ischemic attack (TIA), and cerebral infarction without residual deficits 11/23/2012   Encounter for long-term (current) use of aspirin 11/23/2012   Status post percutaneous transluminal coronary angioplasty 11/23/2012   Transient ischemic attack (TIA), and cerebral infarction without residual  deficits(V12.54) 11/23/2012   Anemia 08/17/2012   Chronic obstructive pulmonary disease ([place]) 08/17/2012   HLD (hyperlipidemia) 08/17/2012   OP (osteoporosis) 08/17/2012   Type 2 diabetes mellitus with hyperlipidemia ([place]) 08/17/2012   Ischemic heart disease due to coronary artery obstruction ([place]) 07/24/2012    Immunization History  Administered Date(s) Administered   Fluad Quad(high Dose 65+) 12/01/2020   Influenza, High Dose Seasonal PF 02/12/2016, 12/16/2016, 12/19/2017   Influenza,inj,Quad PF,6+ Mos 12/09/2019   PFIZER(Purple Top)SARS-COV-2 Vaccination 07/14/2019, 08/14/2019   Pfizer Covid-19 Vaccine Bivalent Booster 5y-11y 01/27/2021   Pneumococcal Polysaccharide-23 06/02/2021   Zoster Recombinat (Shingrix) 05/07/2018, 11/21/2018    Conditions to be addressed/monitored:  Hypertension, Hyperlipidemia, Diabetes, Heart Failure, Coronary Artery Disease, GERD, COPD, Anxiety, Osteoporosis, and Insomnia, History of TIA  There are no care plans that you recently modified to display for this patient.     Medication Assistance: Application for Trulicity  medication assistance program. in process.  Anticipated assistance start date TBD.  See plan of care for additional detail.  Compliance/Adherence/Medication fill history: Care Gaps: Tetanus/TDAP Dexa  Scan Diabetic Foot Exam A1C  > 9  Star-Rating Drugs: Atorvastatin 20 mg last filled on 04/28/2021 for a 90-Day supply with [facility] Losartan 25 mg last filled on 06/09/2021 for a 90-Day supply with [facility] Trulicity 1.5 mg last filled on 06/18/2021 for a 28-Day supply with [facility]  Patient's preferred pharmacy is:  [facility], [address] [place] [address] Phone: [phone] Fax: [phone]  [id number] [place], [place] [address] Phone: [phone] Fax:  [phone]  Uses pill box? Yes Pt endorses 100% compliance  We discussed: Current pharmacy is preferred with insurance plan and patient is satisfied with pharmacy services Patient decided to: Continue current medication management strategy  Care Plan and Follow Up Patient Decision:  Patient agrees to Care Plan and Follow-up.  Plan: Telephone follow up appointment with care management team member scheduled for:  07/07/2021 at 10:15 AM  [name], PharmD, [place], CPP  Clinical Pharmacist Practitioner  [facility] [phone]  Current Barriers:  Unable to independently afford treatment regimen Unable to achieve control of diabetes   Pharmacist Clinical Goal(s):  Patient will verbalize ability to afford treatment regimen achieve control of diabetes as evidenced by A1c less than 8% through collaboration with PharmD and provider.   Interventions: 1:1 collaboration with [name], FNP regarding development and update of comprehensive plan of care as evidenced by provider attestation and co-signature Inter-disciplinary care team collaboration (see longitudinal plan of care) Comprehensive medication review performed; medication list updated in electronic medical record  Heart Failure (Goal: manage symptoms and prevent exacerbations) -Controlled -Last ejection fraction: 40-45% (Date: Jan 2023) -HF type: Systolic -NYHA Class: III (marked limitation of activity) -Current treatment: Furosemide 20 mg daily: Appropriate, Effective, Safe, Accessible Losartan 25 mg daily: Appropriate, Effective, Safe, Accessible  Metoprolol XL 25 mg daily: Appropriate, Effective, Safe, Accessible  -Medications previously tried: NA  -Only taking metoprolol 25 mg daily due to not understanding instructions.  -Current home BP/HR readings: Not monitoring -Resume metoprolol XL 25 mg twice daily  Hyperlipidemia: (LDL goal < 70) -Controlled -History of PAD, CAD, TIA -Current  treatment: Atorvastatin 20 mg daily: Appropriate, Effective, Safe, Accessible  -Medications previously tried: NA  -Recommended to continue current medication  Diabetes (A1c goal <8%) -Uncontrolled -Current medications: Lantus 20 units nightly: Appropriate, Query effective  Trulicity 1.5 mg weekly (wednesdays): Appropriate, Query effective  -Medications previously tried: Jardiance  -Current home glucose readings fasting glucose: 199, 205, 228, 208, 207, 242 ([employer])  -Reports hyperglycemic symptoms: polyuria -Candidate for SGLT-2 inhibitor, will defer until A1c improves.  -Increase Lantus to 25 units daily  -Start PAP for Trulicity   Anxiety / Insomnia (Goal: Maintain symptom remission) -Controlled -Current treatment: Hydroxyzine 25 mg three times daily as needed: Appropriate, Effective, Safe, Accessible Sertraline 25 mg daily: Appropriate, Effective, Safe, Accessible  Trazodone 100 mg 1/2 tablet nightly: Appropriate, Effective, Safe, Accessible  -Medications previously tried/failed: NA -GAD7: 8 -Enjoys puzzles, word searches  -Recommended to continue current medication  Osteoporosis / Osteopenia (Goal Prevent fractures) -Uncontrolled -Patient is a candidate for pharmacologic treatment due to T-Score < -2.5 in femoral neck -Current treatment  Calcium-Vitamin D twice daily: Appropriate, Effective, Safe, Accessible  Ergocalciferol 50,000 units weekly: Appropriate, Effective, Safe, Accessible  -Medications previously tried: NA  -Recheck Vitamin D  -Recommended to continue current medication  GERD (Goal: Prevent reflux) -Controlled -Current treatment  Omeprazole 40 mg daily  -Medications previously tried: NA  -Excellent symptom control, rebound symptoms if skipping a day.  -Recommended to continue current medication  Overactive Bladder (Goal: Minimize symptoms) -Controlled -Current treatment  Oxybutynin XL 10 mg daily  -Medications previously tried:  NA -Waking up 2-3 times nightly, much better controlled with oxybutynin.  -Will see if symptom changes as hyperglycemia improves.   -Recommended to continue current medication  Patient Goals/Self-Care Activities Patient will:  - check glucose daily before breakfast, document, and provide at future appointments check blood pressure 2-3 times weekly, document, and provide at future appointments weigh daily, and contact provider if weight gain of greater than 2 pounds in 24 hours  Follow Up Plan: Telephone follow up appointment with care management team member scheduled for:  07/07/2021 at 10:15 AM

## 2021-07-08 DIAGNOSIS — I2510 Atherosclerotic heart disease of native coronary artery without angina pectoris: Secondary | ICD-10-CM | POA: Diagnosis not present

## 2021-07-08 DIAGNOSIS — E11319 Type 2 diabetes mellitus with unspecified diabetic retinopathy without macular edema: Secondary | ICD-10-CM | POA: Diagnosis not present

## 2021-07-08 DIAGNOSIS — E1140 Type 2 diabetes mellitus with diabetic neuropathy, unspecified: Secondary | ICD-10-CM | POA: Diagnosis not present

## 2021-07-08 DIAGNOSIS — K589 Irritable bowel syndrome without diarrhea: Secondary | ICD-10-CM | POA: Diagnosis not present

## 2021-07-08 DIAGNOSIS — K219 Gastro-esophageal reflux disease without esophagitis: Secondary | ICD-10-CM | POA: Diagnosis not present

## 2021-07-08 DIAGNOSIS — N189 Chronic kidney disease, unspecified: Secondary | ICD-10-CM | POA: Diagnosis not present

## 2021-07-08 DIAGNOSIS — M810 Age-related osteoporosis without current pathological fracture: Secondary | ICD-10-CM | POA: Diagnosis not present

## 2021-07-08 DIAGNOSIS — M86172 Other acute osteomyelitis, left ankle and foot: Secondary | ICD-10-CM | POA: Diagnosis not present

## 2021-07-08 DIAGNOSIS — Z7984 Long term (current) use of oral hypoglycemic drugs: Secondary | ICD-10-CM | POA: Diagnosis not present

## 2021-07-08 DIAGNOSIS — E1169 Type 2 diabetes mellitus with other specified complication: Secondary | ICD-10-CM | POA: Diagnosis not present

## 2021-07-08 DIAGNOSIS — Z8616 Personal history of COVID-19: Secondary | ICD-10-CM | POA: Diagnosis not present

## 2021-07-08 DIAGNOSIS — N3281 Overactive bladder: Secondary | ICD-10-CM | POA: Diagnosis not present

## 2021-07-08 DIAGNOSIS — J449 Chronic obstructive pulmonary disease, unspecified: Secondary | ICD-10-CM | POA: Diagnosis not present

## 2021-07-08 DIAGNOSIS — G4700 Insomnia, unspecified: Secondary | ICD-10-CM | POA: Diagnosis not present

## 2021-07-08 DIAGNOSIS — I5042 Chronic combined systolic (congestive) and diastolic (congestive) heart failure: Secondary | ICD-10-CM | POA: Diagnosis not present

## 2021-07-08 DIAGNOSIS — I130 Hypertensive heart and chronic kidney disease with heart failure and stage 1 through stage 4 chronic kidney disease, or unspecified chronic kidney disease: Secondary | ICD-10-CM | POA: Diagnosis not present

## 2021-07-08 DIAGNOSIS — E1122 Type 2 diabetes mellitus with diabetic chronic kidney disease: Secondary | ICD-10-CM | POA: Diagnosis not present

## 2021-07-08 DIAGNOSIS — L89626 Pressure-induced deep tissue damage of left heel: Secondary | ICD-10-CM | POA: Diagnosis not present

## 2021-07-08 DIAGNOSIS — E785 Hyperlipidemia, unspecified: Secondary | ICD-10-CM | POA: Diagnosis not present

## 2021-07-08 DIAGNOSIS — E1165 Type 2 diabetes mellitus with hyperglycemia: Secondary | ICD-10-CM | POA: Diagnosis not present

## 2021-07-08 DIAGNOSIS — Z87440 Personal history of urinary (tract) infections: Secondary | ICD-10-CM | POA: Diagnosis not present

## 2021-07-08 DIAGNOSIS — M1990 Unspecified osteoarthritis, unspecified site: Secondary | ICD-10-CM | POA: Diagnosis not present

## 2021-07-08 DIAGNOSIS — M6281 Muscle weakness (generalized): Secondary | ICD-10-CM | POA: Diagnosis not present

## 2021-07-12 ENCOUNTER — Other Ambulatory Visit: Payer: Self-pay | Admitting: Family Medicine

## 2021-07-12 DIAGNOSIS — F419 Anxiety disorder, unspecified: Secondary | ICD-10-CM

## 2021-07-13 NOTE — Telephone Encounter (Signed)
Requested Prescriptions  ?Pending Prescriptions Disp Refills  ?? sertraline (ZOLOFT) 25 MG tablet [Pharmacy Med Name: SERTRALINE HCL 25 MG TABLET] 90 tablet 0  ?  Sig: TAKE 1 TABLET (25 MG TOTAL) BY MOUTH DAILY.  ?  ? Psychiatry:  Antidepressants - SSRI - sertraline Passed - 07/12/2021  3:34 AM  ?  ?  Passed - AST in normal range and within 360 days  ?  AST  ?Date Value Ref Range Status  ?06/03/2021 15 13 - 35 Final  ?   ?  ?  Passed - ALT in normal range and within 360 days  ?  ALT  ?Date Value Ref Range Status  ?06/03/2021 10 7 - 35 U/L Final  ?   ?  ?  Passed - Completed PHQ-2 or PHQ-9 in the last 360 days  ?  ?  Passed - Valid encounter within last 6 months  ?  Recent Outpatient Visits   ?      ? 1 month ago Type 2 diabetes mellitus with hyperlipidemia ([place])  ? [facility] [name] T, FNP  ? 3 months ago Chronic heel pain, left  ? [facility] [name] T, FNP  ? 4 months ago 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 edema, with long-term current use of insulin ([place])  ? [facility] [name], FNP  ? 5 months ago Hospital discharge follow-up  ? [facility] [name], FNP  ? 8 months ago 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 edema, with long-term current use of insulin ([place])  ? [facility] [name] T, FNP  ?  ?  ?Future Appointments   ?        ? In 1 month [name], [place], P[facility]  ?  ? ?  ?  ?  ? ?

## 2021-07-14 DIAGNOSIS — L89626 Pressure-induced deep tissue damage of left heel: Secondary | ICD-10-CM | POA: Diagnosis not present

## 2021-07-14 DIAGNOSIS — I2510 Atherosclerotic heart disease of native coronary artery without angina pectoris: Secondary | ICD-10-CM | POA: Diagnosis not present

## 2021-07-14 DIAGNOSIS — Z8616 Personal history of COVID-19: Secondary | ICD-10-CM | POA: Diagnosis not present

## 2021-07-14 DIAGNOSIS — K219 Gastro-esophageal reflux disease without esophagitis: Secondary | ICD-10-CM | POA: Diagnosis not present

## 2021-07-14 DIAGNOSIS — N189 Chronic kidney disease, unspecified: Secondary | ICD-10-CM | POA: Diagnosis not present

## 2021-07-14 DIAGNOSIS — M1990 Unspecified osteoarthritis, unspecified site: Secondary | ICD-10-CM | POA: Diagnosis not present

## 2021-07-14 DIAGNOSIS — E1165 Type 2 diabetes mellitus with hyperglycemia: Secondary | ICD-10-CM | POA: Diagnosis not present

## 2021-07-14 DIAGNOSIS — M810 Age-related osteoporosis without current pathological fracture: Secondary | ICD-10-CM | POA: Diagnosis not present

## 2021-07-14 DIAGNOSIS — E1140 Type 2 diabetes mellitus with diabetic neuropathy, unspecified: Secondary | ICD-10-CM | POA: Diagnosis not present

## 2021-07-14 DIAGNOSIS — Z7984 Long term (current) use of oral hypoglycemic drugs: Secondary | ICD-10-CM | POA: Diagnosis not present

## 2021-07-14 DIAGNOSIS — K589 Irritable bowel syndrome without diarrhea: Secondary | ICD-10-CM | POA: Diagnosis not present

## 2021-07-14 DIAGNOSIS — E785 Hyperlipidemia, unspecified: Secondary | ICD-10-CM | POA: Diagnosis not present

## 2021-07-14 DIAGNOSIS — I5042 Chronic combined systolic (congestive) and diastolic (congestive) heart failure: Secondary | ICD-10-CM | POA: Diagnosis not present

## 2021-07-14 DIAGNOSIS — E1169 Type 2 diabetes mellitus with other specified complication: Secondary | ICD-10-CM | POA: Diagnosis not present

## 2021-07-14 DIAGNOSIS — E1122 Type 2 diabetes mellitus with diabetic chronic kidney disease: Secondary | ICD-10-CM | POA: Diagnosis not present

## 2021-07-14 DIAGNOSIS — N3281 Overactive bladder: Secondary | ICD-10-CM | POA: Diagnosis not present

## 2021-07-14 DIAGNOSIS — E11319 Type 2 diabetes mellitus with unspecified diabetic retinopathy without macular edema: Secondary | ICD-10-CM | POA: Diagnosis not present

## 2021-07-14 DIAGNOSIS — M6281 Muscle weakness (generalized): Secondary | ICD-10-CM | POA: Diagnosis not present

## 2021-07-14 DIAGNOSIS — J449 Chronic obstructive pulmonary disease, unspecified: Secondary | ICD-10-CM | POA: Diagnosis not present

## 2021-07-14 DIAGNOSIS — Z87440 Personal history of urinary (tract) infections: Secondary | ICD-10-CM | POA: Diagnosis not present

## 2021-07-14 DIAGNOSIS — G4700 Insomnia, unspecified: Secondary | ICD-10-CM | POA: Diagnosis not present

## 2021-07-14 DIAGNOSIS — M86172 Other acute osteomyelitis, left ankle and foot: Secondary | ICD-10-CM | POA: Diagnosis not present

## 2021-07-14 DIAGNOSIS — I130 Hypertensive heart and chronic kidney disease with heart failure and stage 1 through stage 4 chronic kidney disease, or unspecified chronic kidney disease: Secondary | ICD-10-CM | POA: Diagnosis not present

## 2021-07-15 ENCOUNTER — Other Ambulatory Visit: Payer: Self-pay | Admitting: Family Medicine

## 2021-07-15 DIAGNOSIS — E559 Vitamin D deficiency, unspecified: Secondary | ICD-10-CM

## 2021-07-16 ENCOUNTER — Telehealth: Payer: Self-pay | Admitting: Family Medicine

## 2021-07-16 NOTE — Telephone Encounter (Signed)
Pt is calling to ask where does she need to return [place] Patient Assistance Program Paperwork that she received in the mail? ? ?CB- [phone] ?

## 2021-07-17 ENCOUNTER — Ambulatory Visit: Payer: Self-pay | Admitting: *Deleted

## 2021-07-17 NOTE — Telephone Encounter (Signed)
?  Chief Complaint: Is there an OTC potassium chloride that is same thing as the prescription KLOR-CON 10 mEq? ?Symptoms: N/A ?Frequency: N/A ?Pertinent Negatives: Patient denies N/A ?Disposition: '[]'$ ED /'[]'$ Urgent Care (no appt availability in office) / '[]'$ Appointment(In office/virtual)/ '[]'$  [place] [facility]/ '[]'$ Home Care/ '[]'$ Refused Recommended Disposition /'[]'$ [place] Mobile [facility]/ '[x]'$  Follow-up with PCP ?Additional Notes: Question sent to [facility] for [name], FNP.   Pt agreeable to someone calling her back.    ?

## 2021-07-17 NOTE — Telephone Encounter (Signed)
Pt is calling again to ask does the paperwork need to be returned to [place] or to the company? Please advise  ?

## 2021-07-17 NOTE — Telephone Encounter (Signed)
Lmtcb, there is difference with prescription strength and otc based off formulary. Per [name] given patient recent elevated potasium levels if she would like to can stop prescription potasium. KW ?

## 2021-07-17 NOTE — Telephone Encounter (Signed)
I returned pt's call.   She is wanting to know if potassium chloride (KLOR-CON) 10 mEq tablets the same as the potassium you can purchase OTC?   ?Reason for Disposition ? [1] Caller has URGENT medicine question about med that PCP or specialist prescribed AND [2] triager unable to answer question ? ?Answer Assessment - Initial Assessment Questions ?1. NAME of MEDICATION: "What medicine are you calling about?" ?    Potassium chloride (KLOR-CON) 10 mEq ? ?2. QUESTION: "What is your question?" (e.g., double dose of medicine, side effect)  ?    Is potassium chloride something I can buy over the counter?  I've been taking it as a prescription. ? ?3. PRESCRIBING HCP: "Who prescribed it?" Reason: if prescribed by specialist, call should be referred to that group. ?    [name], FNP ?4. SYMPTOMS: "Do you have any symptoms?" ?    N/A ?5. SEVERITY: If symptoms are present, ask "Are they mild, moderate or severe?" ?    N/A ?6. PREGNANCY:  "Is there any chance that you are pregnant?" "When was your last menstrual period?" ?    N/A ? ?Protocols used: Medication Question Call-A-AH ? ?

## 2021-07-17 NOTE — Telephone Encounter (Signed)
Patient is going to send the paperwork to [place] for Cristie Hem to review and forward ?

## 2021-07-19 ENCOUNTER — Other Ambulatory Visit: Payer: Self-pay | Admitting: Family Medicine

## 2021-07-19 DIAGNOSIS — F419 Anxiety disorder, unspecified: Secondary | ICD-10-CM

## 2021-07-19 DIAGNOSIS — F5101 Primary insomnia: Secondary | ICD-10-CM

## 2021-07-20 NOTE — Telephone Encounter (Signed)
Patient has been advised. KW 

## 2021-07-20 NOTE — Telephone Encounter (Signed)
Patient called and advised of message from [place], [place]. She says that nothing is wrong with the medicine, she just wants to know if the OTC potassium the same and as effective as the prescription. She says she's wondering if the cost is cheaper OTC and if will work the same. I advised I will send this to [name] and someone will call back with her recommendation. ?

## 2021-07-21 ENCOUNTER — Other Ambulatory Visit: Payer: Self-pay | Admitting: Physician Assistant

## 2021-07-21 DIAGNOSIS — I10 Essential (primary) hypertension: Secondary | ICD-10-CM

## 2021-07-23 ENCOUNTER — Telehealth: Payer: Self-pay | Admitting: *Deleted

## 2021-07-23 NOTE — Chronic Care Management (AMB) (Signed)
  Chronic Care Management Note  07/23/2021 Name: ANESA FRONEK MRN: [id number] DOB: 14-Jul-1945  SHAJUANA MCLUCAS is a 76 y.o. year old female who is a primary care patient of [name], FNP and is actively engaged with the care management team. I reached out to [name] by phone today to assist with scheduling a follow up visit with the RN Case Manager  Follow up plan: Unsuccessful telephone outreach attempt made. A HIPAA compliant phone message was left for the patient providing contact information and requesting a return call.   Julian Hy, [place] Management  Direct Dial: [phone]

## 2021-08-10 ENCOUNTER — Other Ambulatory Visit: Payer: Self-pay | Admitting: Family Medicine

## 2021-08-10 DIAGNOSIS — E113299 Type 2 diabetes mellitus with mild nonproliferative diabetic retinopathy without macular edema, unspecified eye: Secondary | ICD-10-CM

## 2021-08-17 ENCOUNTER — Telehealth: Payer: Self-pay

## 2021-08-17 ENCOUNTER — Other Ambulatory Visit: Payer: Self-pay | Admitting: Family Medicine

## 2021-08-17 DIAGNOSIS — N3281 Overactive bladder: Secondary | ICD-10-CM

## 2021-08-17 NOTE — Progress Notes (Cosign Needed)
    Chronic Care Management Pharmacy Assistant   Name: Andrea [name]  MRN: [id number] DOB: 1945/09/23  Reason for Encounter: Patient Assistance Application Follow-up for Ozempic/Tresiba  I received a task from [name], CPP advising that he faxed the patient assistance application over to [name] on 08/04/2021 and would like me to check the status of the application.  I contacted [name] and spoke with the representative who advised that patient has been approved for this medication as of 08/11/2021 until 03/07/2022. The shipment is still processing but will be shipped to the providers office and should arrive in about 1.5 weeks.   I called the patient and updated her on her approved status for both Ozempic and [name]. The patient could not remember why she was starting the medications. I educated the patient that these are diabetic medications and that due to the cost of these medications we were able to them through the patient assistance program which was the application she completed and returned.  Patient has other questions regarding these medications so I informed her due to her missed appointment with CPP in May I will reschedule her for 08/25/2021 @ 0945 so she can speak with him prior to getting her medications so she will have clarity. Patient is agreeable.   Lynann Bologna, CPA/CMA Clinical Pharmacist Assistant Phone: [phone]

## 2021-08-24 NOTE — Chronic Care Management (AMB) (Signed)
  Chronic Care Management Note  08/24/2021 Name: [name] MRN: [id number] DOB: Jul 29, 1945  Andrea [name] is a 76 y.o. year old female who is a primary care patient of [name], FNP and is actively engaged with the care management team. I reached out to [name] by phone today to assist with re-scheduling a follow up visit with the RN Case Manager  Follow up plan: Telephone appointment with care management team member scheduled for: 09/22/2021  Julian Hy, [place], [place] Management  Direct Dial: [phone]

## 2021-08-25 ENCOUNTER — Ambulatory Visit (INDEPENDENT_AMBULATORY_CARE_PROVIDER_SITE_OTHER): Payer: Medicare Other

## 2021-08-25 DIAGNOSIS — E1169 Type 2 diabetes mellitus with other specified complication: Secondary | ICD-10-CM

## 2021-08-25 DIAGNOSIS — E7800 Pure hypercholesterolemia, unspecified: Secondary | ICD-10-CM

## 2021-08-25 MED ORDER — OZEMPIC (0.25 OR 0.5 MG/DOSE) 2 MG/3ML ~~LOC~~ SOPN: 0.5000 mg | PEN_INJECTOR | SUBCUTANEOUS | Status: DC

## 2021-08-25 MED ORDER — TRESIBA FLEXTOUCH 100 UNIT/ML ~~LOC~~ SOPN: 25.0000 [IU] | PEN_INJECTOR | Freq: Every day | SUBCUTANEOUS | Status: DC

## 2021-08-25 NOTE — Progress Notes (Signed)
Chronic Care Management Pharmacy Note  08/25/2021 Name:  Andrea [name] MRN:  [id number] DOB:  August 15, 1945  Summary: Patient presents for CCM follow-up. She is struggling with her diet. Ozempic, [name] obtained through [name] medication assistance program.  Enrollment ends Dec 2023. Patient will continue with current supply of Lantus and Trulicity prior to switching to [place].   Recommendations/Changes made from today's visit: -Increase Lantus to 25 units daily  -Recommend rechecking Vitamin D, DEXA -Patient is due for diabetes foot exam.   Plan: PCP follow-up in one week  HC DM Assessment in two weeks  CPP follow-up 1 month  Subjective: Andrea [name] is an 76 y.o. year old female who is a primary patient of [name], FNP.  The CCM team was consulted for assistance with disease management and care coordination needs.    Engaged with patient by telephone for follow up visit in response to provider referral for pharmacy case management and/or care coordination services.   Consent to Services:  The patient was given information about Chronic Care Management services, agreed to services, and gave verbal consent prior to initiation of services.  Please see initial visit note for detailed documentation.   Patient Care Team: [name], FNP as PCP - General (Family Medicine) Minna Merritts, MD as PCP - Cardiology (Cardiology) Dingeldein, Remo Lipps, MD as Consulting Physician (Ophthalmology) Newman Pies, MD as Consulting Physician (Neurosurgery) Lucky Cowboy, Erskine Squibb, MD as Referring Physician (Vascular Surgery) [name], MD as Consulting Physician (Gastroenterology) Neldon Labella, RN as Case Manager Germaine Pomfret, [facility] (Pharmacist)  Recent office visits: 06/02/2021 [name], [place] (PCP Office Visit) for Type II DM- Stopped: Amoxicillin due to patient not taking, Started: Clobetasol Propionate 1.10% 1 application daily, Lab orders placed,  Referral for Dermatology placed, Patient to follow-up in 4 months   04/02/2021 [name], [place] (PCP Office Visit) for Medication Refill- No medication changes noted, No orders placed, Patient to follow-up in 4 weeks   02/24/2021 [name], FNP (PCP Office Visit) for Type II DM- Stopped: Doxycycline 100 mg due to patient not taking, Changed: Insulin Glargine 14 units daily to 20 units daily, Lab order placed, Patient to follow-up in 3 months   01/23/2021 [name], [place] (PCP Office Visit) for Hospital Discharge Follow-up- Changed: Trazodone HCl 100 mg daily to 50 mg daily, No orders placed, Patient to follow-up in 4 weeks  Recent consult visits: 05/26/2021 Daylene Katayama, DPM (Podiatry) for Diabetic Ulcer- No medication changes noted, No orders placed, Patient to follow-up as needed   04/28/2021 Ida Rogue, MD (Cardiology) for 1 year follow-up- Changed: Metoprolol Succinate 25 mg 1 tablet daily to twice daily, EKG 12-Lead ordered, Patient to follow-up in 1 year   04/21/2021 Daylene Katayama, DPM (Podiatry) for Diabetic Ulcer- No medication changes noted, DG Foot Complete Left ordered, Patient to follow-up in 4 weeks   03/24/2021 Daylene Katayama, DPM (Podiatry) for Wound Check-No medication changes noted, DG Foot Complete Left ordered, Patient to follow-up in 4 weeks   Hospital visits: None in previous 6 months   Objective:  Lab Results  Component Value Date   CREATININE 1.1 06/03/2021   BUN 30 (H) 06/02/2021   EGFR 51 06/03/2021   GFRNONAA 46 (L) 12/22/2020   GFRAA 70 02/11/2020   NA 138 06/03/2021   K 5.0 06/03/2021   CALCIUM 9.4 06/03/2021   CO2 22 06/02/2021   GLUCOSE 243 (H) 06/02/2021    Lab Results  Component Value Date/Time  HGBA1C 11.3 06/03/2021 04:44 PM   HGBA1C 11.3 (A) 06/02/2021 01:59 PM   HGBA1C 11.3 (H) 06/02/2021 12:00 AM   HGBA1C 7.5 (A) 02/24/2021 01:26 PM   HGBA1C 7.5 (H) 11/17/2020 10:34 PM   HGBA1C 7.5 (H) 11/26/2013 04:14 AM   MICROALBUR 20 12/16/2016  03:10 PM    Last diabetic Eye exam:  Lab Results  Component Value Date/Time   HMDIABEYEEXA No Retinopathy 04/08/2021 12:00 AM    Last diabetic Foot exam: No results found for: "HMDIABFOOTEX"   Lab Results  Component Value Date   CHOL 136 06/03/2021   HDL 66 06/03/2021   LDLCALC 41 06/03/2021   TRIG 178 (A) 06/03/2021   CHOLHDL 2.1 06/02/2021       Latest Ref Rng & Units 06/03/2021    4:44 PM 06/02/2021   12:00 AM 12/18/2020   10:22 AM  Hepatic Function  Total Protein 6.0 - 8.5 g/dL  6.7  6.3   Albumin 3.5 - 5.0 4.2     4.2  2.6   AST 13 - 35 _0 ALT 7 - 35 U/L _1 Alk Phosphatase 44 - 121 IU/L  144  130   Total Bilirubin 0.0 - 1.2 mg/dL  0.6  0.8      This result is from an external source.    Lab Results  Component Value Date/Time   TSH 1.88 06/03/2021 04:44 PM   TSH 1.880 06/02/2021 12:00 AM   TSH 1.220 12/28/2018 04:26 PM   FREET4 1.33 06/02/2021 12:00 AM       Latest Ref Rng & Units 12/20/2020    4:49 AM 12/19/2020    5:45 AM 12/18/2020   10:22 AM  CBC  WBC 4.0 - 10.5 K/uL 7.6  7.0  9.5   Hemoglobin 12.0 - 15.0 g/dL 10.5  10.4  11.4   Hematocrit 36.0 - 46.0 % 32.7  31.8  35.1   Platelets 150 - 400 K/uL 211  189  188     No results found for: "VD25OH"  Clinical ASCVD: Yes  The ASCVD Risk score (Arnett DK, et al., 2019) failed to calculate for the following reasons:   The patient has a prior MI or stroke diagnosis       06/30/2021    1:04 PM 11/17/2020    4:03 PM 06/05/2020   11:20 AM  Depression screen PHQ 2/9  Decreased Interest 0 0 0  Down, Depressed, Hopeless 0 0 0  PHQ - 2 Score 0 0 0  Altered sleeping   0  Tired, decreased energy   0  Change in appetite   0  Feeling bad or failure about yourself    0  Trouble concentrating   0  Moving slowly or fidgety/restless   0  Suicidal thoughts   0  PHQ-9 Score   0  Difficult doing work/chores   Not difficult at all    -Last DEXA Scan: 03/07/18   T-Score femoral neck:  -3.0  T-Score total hip: NA  T-Score lumbar spine: -0.4  T-Score forearm radius: -4.0  Social History   Tobacco Use  Smoking Status Former   Packs/day: 1.00   Years: 30.00   Total pack years: 30.00   Types: Cigarettes   Quit date: 07/17/2012   Years since quitting: 9.1  Smokeless Tobacco Never  Tobacco Comments       BP Readings from Last 3 Encounters:  06/02/21 115/71  04/28/21 (!) 120/50  02/24/21 (!) 131/59   Pulse Readings from Last 3 Encounters:  06/02/21 91  04/28/21 97  02/24/21 96   Wt Readings from Last 3 Encounters:  06/30/21 137 lb (62.1 kg)  06/02/21 137 lb 11.2 oz (62.5 kg)  04/28/21 135 lb 6 oz (61.4 kg)   BMI Readings from Last 3 Encounters:  06/30/21 26.76 kg/m  06/02/21 26.89 kg/m  04/28/21 26.44 kg/m    Assessment/Interventions: Review of patient past medical history, allergies, medications, health status, including review of consultants reports, laboratory and other test data, was performed as part of comprehensive evaluation and provision of chronic care management services.   SDOH:  (Social Determinants of Health) assessments and interventions performed: Yes   SDOH Screenings   Alcohol Screen: Low Risk  (06/30/2021)   Alcohol Screen    Last Alcohol Screening Score (AUDIT): 0  Depression (PHQ2-9): Low Risk  (06/30/2021)   Depression (PHQ2-9)    PHQ-2 Score: 0  Financial Resource Strain: High Risk (07/06/2021)   Overall Financial Resource Strain (CARDIA)    Difficulty of Paying Living Expenses: Hard  Food Insecurity: No Food Insecurity (06/30/2021)   Hunger Vital Sign    Worried About Running Out of Food in the Last Year: Never true    Ran Out of Food in the Last Year: Never true  Housing: Low Risk  (06/30/2021)   Housing    Last Housing Risk Score: 0  Physical Activity: Sufficiently Active (06/30/2021)   Exercise Vital Sign    Days of Exercise per Week: 7 days    Minutes of Exercise per Session: 30 min  Social Connections: Moderately  Isolated (06/30/2021)   Social Connection and Isolation Panel [NHANES]    Frequency of Communication with Friends and Family: More than three times a week    Frequency of Social Gatherings with Friends and Family: Once a week    Attends Religious Services: Never    Marine scientist or Organizations: No    Attends Archivist Meetings: Never    Marital Status: Married  Stress: No Stress Concern Present (06/30/2021)   [place]    Feeling of Stress : Not at all  Tobacco Use: Medium Risk (06/30/2021)   Patient History    Smoking Tobacco Use: Former    Smokeless Tobacco Use: Never    Passive Exposure: Not on file  Transportation Needs: No Transportation Needs (07/06/2021)   PRAPARE - Hydrologist (Medical): No    Lack of Transportation (Non-Medical): No    CCM Care Plan  Allergies  Allergen Reactions   Chlorhexidine    Metformin And Related Diarrhea   Sulfa Antibiotics Rash    Medications Reviewed Today     Reviewed by Germaine Pomfret, [facility] (Pharmacist) on 08/25/21 at [place] List Status: <None>   Medication Order Taking? Sig Documenting Provider Last Dose Status Informant  Alcohol Swabs (ALCOHOL PREP) PADS [id number] No Use twice daily prior to SQ injection of insulin to clean skin Mar Daring, PA-C Taking Active Nursing Home Medication Administration Guide (MAG)  atorvastatin (LIPITOR) 20 MG tablet [id number] No Take 1 tablet (20 mg total) by mouth at bedtime. [name], FNP Taking Active   Calcium Carbonate-Vitamin D 600-400 MG-UNIT tablet [id number] No Take 1 tablet by mouth daily. [provider] Taking Active Nursing Home Medication Administration Guide (MAG)  clobetasol (TEMOVATE) 0.05 % external solution  [id number] No Apply 1 application. topically 2 (two) times daily. [name], FNP Taking Active   Cyanocobalamin (B-12) 1000 MCG CAPS  [id number] No Take 1,000 mcg by mouth daily.   Patient not taking: Reported on 06/30/2021   [provider] Not Taking Active Nursing Home Medication Administration Guide (MAG)  furosemide (LASIX) 20 MG tablet [id number] No Take 20 mg by mouth daily. [provider] Taking Active   hydrOXYzine (ATARAX) 25 MG tablet [id number]  TAKE 1 TABLET BY MOUTH THREE TIMES A DAY AS NEEDED [name], FNP  Active   Insulin Pen Needle (TRUEPLUS PEN NEEDLES) 31G X 6 MM MISC [id number] No Use with lantus two times daily. Virginia Crews, MD Taking Active Nursing Home Medication Administration Guide (MAG)  INSULIN SYRINGE 1CC/29G 29G X 1/2" 1 ML MISC [id number] No For lantus injections twice daily Mar Daring, PA-C Taking Active Nursing Home Medication Administration Guide (MAG)  losartan (COZAAR) 25 MG tablet [id number] No TAKE 1 TABLET BY MOUTH EVERY DAY [name], FNP Taking Active   metoprolol succinate (TOPROL-XL) 25 MG 24 hr tablet [id number] No Take 1 tablet (25 mg total) by mouth 2 (two) times daily. Minna Merritts, MD Taking Active   Multiple Vitamins-Minerals (PRESERVISION AREDS 2 PO) [id number] No Take 1 tablet by mouth in the morning and at bedtime. [provider] Taking Active Nursing Home Medication Administration Guide (MAG)  Omega-3 1000 MG CAPS [id number] No Take 1,000 mg by mouth daily. [provider] Taking Active Nursing Home Medication Administration Guide (MAG)  omeprazole (PRILOSEC) 40 MG capsule [id number] No TAKE 1 CAPSULE BY MOUTH EVERY DAY [name], FNP Taking Active   OneTouch Delica Lancets 35W MISC [id number] No To check blood sugar daily  DX: E11.9 Virginia Crews, MD Taking Active Nursing Home Medication Administration Guide (MAG)  ONETOUCH VERIO test strip [id number] No TO CHECK BLOOD SUGAR ONCE DAILY. [name], FNP Taking Active   oxybutynin (DITROPAN-XL) 10 MG 24 hr tablet [id number]  TAKE 1 TABLET BY MOUTH EVERYDAY AT  BEDTIME [name] T, FNP  Active   oxyCODONE (OXY IR/ROXICODONE) 5 MG immediate release tablet [id number] No Take 1 tablet (5 mg total) by mouth every 4 (four) hours as needed for severe pain. [name], FNP Taking Active   potassium chloride (KLOR-CON) 10 MEQ tablet [id number] No TAKE 1 TABLET BY MOUTH EVERY DAY [name], FNP Taking Active   sertraline (ZOLOFT) 25 MG tablet [id number]  TAKE 1 TABLET (25 MG TOTAL) BY MOUTH DAILY. [name] T, FNP  Active   traZODone (DESYREL) 100 MG tablet [id number]  TAKE 1 TABLET BY MOUTH EVERYDAY AT BEDTIME [name] T, FNP  Active   Vitamin D, Ergocalciferol, (DRISDOL) 1.25 MG (50000 UNIT) CAPS capsule [id number]  TAKE ONE CAPSULE BY MOUTH ONCE WEEKLY ON THE SAME DAY EACH WEEK (ON FRIDAYS) [name], FNP  Active   Med List Note Loann Quill, CPhT 12/18/20 1047): [employer] [phone]            Patient Active Problem List   Diagnosis Date Noted   Need for pneumococcal vaccination 06/02/2021   Hair loss 06/02/2021   Hyperlipidemia LDL goal <70 04/28/2021   Chronic heel pain, left 04/02/2021   MRSA (methicillin resistant staph aureus) culture positive 02/05/2021   Hospital discharge follow-up 01/23/2021   Weight loss, unintentional 01/23/2021   Primary insomnia 01/23/2021   Loss of appetite for more than 2 weeks  01/23/2021   Hypomagnesemia    Hypokalemia    Essential hypertension    Tachycardia    Depression    Acute metabolic encephalopathy 93/81/0175   Acute kidney injury superimposed on CKD ([place]) 12/18/2020   Delirium 12/30/8525   Chronic systolic CHF (congestive heart failure) ([place]) 12/18/2020   Osteomyelitis of foot, left, acute ([place]) 12/18/2020   Status post peripherally inserted central catheter (PICC) central line placement    Osteomyelitis ([place]) 11/17/2020   Cellulitis of left lower extremity 11/13/2020   Hypoglycemia 11/13/2020   Non healing left heel wound 11/13/2020   Status post total hip  replacement, 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   Pain due to onychomycosis of toenail of left foot 03/06/2020   Malaise and fatigue 12/28/2019   UTI (urinary tract infection) 12/28/2019   Hyponatremia 12/27/2019   Intertrigo 12/27/2019   History of meningitis 11/23/2019   CSF leak from nose 11/22/2019   History of myocardial infarction 03/09/2019   PAD (peripheral artery disease) ([place]) 12/28/2018   Leg length discrepancy 09/21/2017   Coronary artery disease involving native coronary artery 01/06/2017   Drug-induced constipation 01/06/2017   Spinal stenosis of lumbar region 01/06/2017   Spondylolisthesis of lumbar region 12/30/2016   Obese 04/16/2015   History of colon polyps 11/23/2012   Bundle branch block, 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   Personal history of transient ischemic attack (TIA), and cerebral infarction without residual deficits 11/23/2012   Encounter for long-term (current) use of aspirin 11/23/2012   Status post percutaneous transluminal coronary angioplasty 11/23/2012   Transient ischemic attack (TIA), and cerebral infarction without residual deficits(V12.54) 11/23/2012   Anemia 08/17/2012   Chronic obstructive pulmonary disease ([place]) 08/17/2012   HLD (hyperlipidemia) 08/17/2012   OP (osteoporosis) 08/17/2012   Type 2 diabetes mellitus with hyperlipidemia ([place]) 08/17/2012   Ischemic heart disease due to coronary artery obstruction ([place]) 07/24/2012    Immunization History  Administered Date(s) Administered   Fluad Quad(high Dose 65+) 12/01/2020   Influenza, High Dose Seasonal PF 02/12/2016, 12/16/2016, 12/19/2017   Influenza,inj,Quad PF,6+ Mos 12/09/2019   PFIZER(Purple Top)SARS-COV-2 Vaccination 07/14/2019, 08/14/2019   Pfizer Covid-19 Vaccine Bivalent Booster 5y-11y 01/27/2021   Pneumococcal Polysaccharide-23 06/02/2021   Zoster Recombinat (Shingrix) 05/07/2018, 11/21/2018    Conditions to be addressed/monitored:  Hypertension, Hyperlipidemia, Diabetes, Heart Failure, Coronary Artery  Disease, GERD, COPD, Anxiety, Osteoporosis, and Insomnia, History of TIA  Care Plan : General Pharmacy (Adult)  Updates made by Germaine Pomfret, RPH since 08/25/2021 12:00 AM     Problem: Hypertension, Hyperlipidemia, Diabetes, Heart Failure, Coronary Artery Disease, GERD, COPD, Anxiety, Osteoporosis, and Insomnia, History of TIA   Priority: High     Long-Range Goal: Patient-Specific Goal   Start Date: 06/23/2021  Expected End Date: 07/07/2022  This Visit's Progress: On track  Recent Progress: On track  Priority: High  Note:   Current Barriers:  Unable to independently afford treatment regimen Unable to achieve control of diabetes   Pharmacist Clinical Goal(s):  Patient will verbalize ability to afford treatment regimen achieve control of diabetes as evidenced by A1c less than 8% through collaboration with PharmD and provider.   Interventions: 1:1 collaboration with [name], FNP regarding development and update of comprehensive plan of care as evidenced by provider attestation and co-signature Inter-disciplinary care team collaboration (see longitudinal plan of care) Comprehensive medication review performed; medication list updated in electronic medical record  Heart Failure (Goal: manage symptoms and prevent exacerbations) -Controlled: Not addressed during this visit. -Last ejection fraction: 40-45% (Date: Jan 2023) -HF type: Systolic -  NYHA Class: III (marked limitation of activity) -Current treatment: Furosemide 20 mg daily: Appropriate, Effective, Safe, Accessible Losartan 25 mg daily: Appropriate, Effective, Safe, Accessible  Metoprolol XL 25 mg daily: Appropriate, Effective, Safe, Accessible  -Medications previously tried: NA  -Only taking metoprolol 25 mg daily due to not understanding instructions.  -Current home BP/HR readings: Not monitoring -Resume metoprolol XL 25 mg twice daily  Hyperlipidemia: (LDL goal < 70) -Controlled -History of PAD, CAD,  TIA -Current treatment: Atorvastatin 20 mg daily: Appropriate, Effective, Safe, Accessible  -Medications previously tried: NA  -Recommended to continue current medication  Diabetes (A1c goal <8%) -Uncontrolled -Current medications: Lantus 20 units nightly (0.32 u/kg): Appropriate, Query effective  Trulicity 1.5 mg weekly (wednesdays): Appropriate, Query effective  -Medications previously tried: [place]  -Patient has received supply of Tresiba and Ozempic through PAP but has not picked up the medications yet.  -Current home glucose readings fasting glucose: 279, 200s  -Reports hyperglycemic symptoms: polydipsia  -Current dietary Patterns: late night snacks of Peanut butter crackers or  chips. Often skips breakfast, light lunch. Larger supper.  -Candidate for SGLT-2 inhibitor, will defer until A1c improves.  -Patient will continue with current supply of Lantus and Trulicity prior to switching to [place].  -Increase Lantus to 25 units daily   Anxiety / Insomnia (Goal: Maintain symptom remission) -Controlled: Not addressed during this visit. -Current treatment: Hydroxyzine 25 mg three times daily as needed: Appropriate, Effective, Safe, Accessible Sertraline 25 mg daily: Appropriate, Effective, Safe, Accessible  Trazodone 100 mg 1/2 tablet nightly: Appropriate, Effective, Safe, Accessible  -Medications previously tried/failed: NA -GAD7: 8 -Enjoys puzzles, word searches  -Recommended to continue current medication  Osteoporosis / Osteopenia (Goal Prevent fractures) -Uncontrolled: Not addressed during this visit. -Patient is a candidate for pharmacologic treatment due to T-Score < -2.5 in femoral neck -Current treatment  Calcium-Vitamin D twice daily: Appropriate, Effective, Safe, Accessible  Ergocalciferol 50,000 units weekly: Appropriate, Effective, Safe, Accessible  -Medications previously tried: NA  -Recheck Vitamin D  -Recommended to continue current  medication  GERD (Goal: Prevent reflux) -Controlled: Not addressed during this visit. -Current treatment  Omeprazole 40 mg daily  -Medications previously tried: NA  -Excellent symptom control, rebound symptoms if skipping a day.  -Recommended to continue current medication  Overactive Bladder (Goal: Minimize symptoms) -Controlled: Not addressed during this visit. -Current treatment  Oxybutynin XL 10 mg daily  -Medications previously tried: NA -Waking up 2-3 times nightly, much better controlled with oxybutynin.  -Will see if symptom changes as hyperglycemia improves.   -Recommended to continue current medication  Patient Goals/Self-Care Activities Patient will:  - check glucose daily before breakfast, document, and provide at future appointments check blood pressure 2-3 times weekly, document, and provide at future appointments weigh daily, and contact provider if weight gain of greater than 2 pounds in 24 hours  Follow Up Plan: Face to Face appointment with care management team member scheduled for: 09/22/2021 at 10:00 AM       Medication Assistance:  Joni Reining obtained through [name] medication assistance program.  Enrollment ends Dec 2023  Compliance/Adherence/Medication fill history: Care Gaps: Tetanus/TDAP Dexa Scan Diabetic Foot Exam A1C  > 9  Star-Rating Drugs: Atorvastatin 20 mg last filled on 04/28/2021 for a 90-Day supply with [facility] Losartan 25 mg last filled on 06/09/2021 for a 90-Day supply with [facility] Trulicity 1.5 mg last filled on 06/18/2021 for a 28-Day supply with [facility]  Patient's preferred pharmacy is:  [place],  [place] - [address] [place] [address] Phone: [phone] Fax: [phone]  [id number] [id number]: [phone]: [phone] Uses pill  box? Yes Pt endorses 100% compliance  We discussed: Current pharmacy is preferred with insurance plan and patient is satisfied with pharmacy services Patient decided to: Continue current medication management strategy  Care Plan and Follow Up Patient Decision:  Patient agrees to Care Plan and Follow-up.  Plan: Face to Face appointment with care management team member scheduled for: 09/22/2021 at 10:00 AM  AJunius Argyle PharmD, BPara [phone]

## 2021-08-25 NOTE — Patient Instructions (Signed)
Visit Information It was great speaking with you today!  Please let me know if you have any questions about our visit.   Goals Addressed             This Visit's Progress    Monitor and Manage My Blood Sugar-Diabetes Type 2   On track    Timeframe:  Long-Range Goal Priority:  High Start Date: 06/23/2021                            Expected End Date: 07/07/2022                      Follow Up within 30 days   - check blood sugar once daily before breakfast - check blood sugar if I feel it is too high or too low - enter blood sugar readings and medication or insulin into daily log - take the blood sugar log to all doctor visits    Why is this important?   Checking your blood sugar at home helps to keep it from getting very high or very low.  Writing the results in a diary or log helps the doctor know how to care for you.  Your blood sugar log should have the time, date and the results.  Also, write down the amount of insulin or other medicine that you take.  Other information, like what you ate, exercise done and how you were feeling, will also be helpful.     Notes:         Patient Care Plan: Fall Risk (Adult)     Problem Identified: Fall Risk      Long-Range Goal: Absence of Fall and Fall-Related Injury   Start Date: 11/17/2020  Expected End Date: 03/17/2021  Priority: High  Note:   Fall Risk  11/17/2020 06/05/2020 07/09/2019 12/28/2018 12/19/2017  Falls in the past year? 0 1 0 0 No  Number falls in past yr: 0 1 0 0 -  Injury with Fall? 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  Interventions:  Collaboration with [name], FNP regarding development and update of comprehensive plan of care as evidenced by provider attestation and co-signature Inter-disciplinary care team collaboration (see longitudinal plan of care) Reviewed medications and discussed potential side effects that may increase risk for falls. Provided information regarding safety and fall prevention. Reports currently being unable to bear weight on her left extremity but has a rolling walker to use if needed. Uses a wheelchair outside of the home. Currently receiving Home Health physical and occupational therapy services. Receiving nursing services for dressing changes to wound on her left heel. Reports following activity restrictions as recommended by the physical therapy team. Discussed ability to perform ADL's and tasks in the home. Unable to perform several tasks in the home but reports performing ADL's independently. Reports a daughter is available to assist in the home as needed. Declined need for additional in-home assistance. She voiced  concerns regarding wheelchair accessible transportation for two pending appointments.  Referral placed for transportation assistance.  Self-Care/Patient Goals:  Utilize assistive device appropriately with all ambulation Ensure pathways are clear and well lit Change positions slowly and use caution when ambulating Wear secure fitting, skid free footwear when ambulating Follow activity restrictions as advised by the [place] team Notify provider or care management team for questions and new concerns as needed     Follow Up Plan:  Will follow up next month    Patient Care Plan: COPD (Adult)     Problem Identified: Symptom Exacerbation (COPD)      Patient Care Plan: Hypertension (Adult)     Problem Identified: Hypertension  (Hypertension)      Long-Range Goal: Hypertension Monitored   Start Date: 11/17/2020  Expected End Date: 03/17/2021  Priority: Medium  Note:   Current Barriers:  Chronic Disease Management support and educational needs related to Hypertension   Case Manager Clinical Goal(s):  Over the next 120 days, patient will demonstrate adherence to prescribed treatment plan for hypertension as evidenced by taking all medications as prescribed, monitoring and recording blood pressure and adhering to a low sodium/DASH diet.   Interventions:  Collaboration with [name], FNP regarding development and update of comprehensive plan of care as evidenced by provider attestation and co-signature Inter-disciplinary care team collaboration (see longitudinal plan of care) Reviewed medications.  Advised to take all medications as prescribed. Advised to notify the care management team with concerns regarding medication management or prescription cost. Provided information regarding established blood pressure parameters along with indications for notifying a provider. Encouraged to monitor and record readings. Reports recent home readings have been within range. Discussed compliance with the recommended cardiac prudent diet. Encouraged to read nutrition labels, monitor sodium intake and avoid highly processed foods when possible. Reviewed s/sx of heart attack, stroke and worsening symptoms that require immediate medical attention.   Patient Goals/Self-Care Activities: Self-administer medications as prescribed Monitor and record blood pressure Adhere to recommended cardiac prudent/heart healthy diet Notify provider or care management team with questions and new concerns as needed    Follow Up Plan:  Will follow up next month    Patient Care Plan: Diabetes Type 2 (Adult)     Problem Identified: Glycemic Management (Diabetes, Type 2)      Long-Range Goal: Glycemic Management Optimized   Start Date:  11/17/2020  Expected End Date: 03/17/2021  Priority: High  Note:    Current Barriers:  Chronic Disease Management support and educational needs related to Diabetes self management.  Case Manager Clinical Goal(s):  Over the next 120 days, patient will demonstrate improved adherence to prescribed treatment plan for diabetes self care/management as evidenced by taking medications as prescribed, daily monitoring and recording of CBG's and adherence to an ADA/ carb modified diet.   Interventions:  Collaboration with [name], FNP regarding development and update of comprehensive plan of care as evidenced by provider attestation and co-signature Inter-disciplinary care team collaboration (see longitudinal plan of care) Reviewed medications and importance of compliance. Advised to take all medications as prescribed and notify the care management team with concerns regarding medication management or prescription cost. Will provide update if assistance is needed for Trulicity. Provided information regarding importance of consistent blood glucose monitoring. Encouraged to monitor and maintain a log. Reviewed s/sx of hypoglycemia and hyperglycemia along with recommended interventions. Reports readings have ranged in the 80's to 220's. Reports fasting reading today of 85 mg/dl.  Discussed  nutritional intake and importance of complying with a carb modified/DM diet. Discussed and offered referrals for available Diabetes education classes. Declined need for additional referrals. Will provide additional information regarding nutrition options for optimal glycemic control. Discussed importance of completing recommended DM preventive care. Reports exams are up to date. She will complete an annual eye exam later this year. Discussed importance of completing daily foot care. Discussed increase risk of delayed wound healing r/t diabetes. Advised to ensure dressing to left heel are completed as scheduled.      Patient Goals/Self-Care Activities Self-administer medications as prescribed Attend all scheduled provider appointments Monitor blood glucose levels consistently and utilize recommended interventions Adhere to prescribed ADA/carb modified Notify provider or care management team with questions and new concerns as needed    Follow Up Plan:  Will follow up next month    Patient Care Plan: General Pharmacy (Adult)     Problem Identified: Hypertension, Hyperlipidemia, Diabetes, Heart Failure, Coronary Artery Disease, GERD, COPD, Anxiety, Osteoporosis, and Insomnia, History of TIA   Priority: High     Long-Range Goal: Patient-Specific Goal   Start Date: 06/23/2021  Expected End Date: 07/07/2022  This Visit's Progress: On track  Recent Progress: On track  Priority: High  Note:   Current Barriers:  Unable to independently afford treatment regimen Unable to achieve control of diabetes   Pharmacist Clinical Goal(s):  Patient will verbalize ability to afford treatment regimen achieve control of diabetes as evidenced by A1c less than 8% through collaboration with PharmD and provider.   Interventions: 1:1 collaboration with [name], FNP regarding development and update of comprehensive plan of care as evidenced by provider attestation and co-signature Inter-disciplinary care team collaboration (see longitudinal plan of care) Comprehensive medication review performed; medication list updated in electronic medical record  Heart Failure (Goal: manage symptoms and prevent exacerbations) -Controlled: Not addressed during this visit. -Last ejection fraction: 40-45% (Date: Jan 2023) -HF type: Systolic -NYHA Class: III (marked limitation of activity) -Current treatment: Furosemide 20 mg daily: Appropriate, Effective, Safe, Accessible Losartan 25 mg daily: Appropriate, Effective, Safe, Accessible  Metoprolol XL 25 mg daily: Appropriate, Effective, Safe, Accessible  -Medications  previously tried: NA  -Only taking metoprolol 25 mg daily due to not understanding instructions.  -Current home BP/HR readings: Not monitoring -Resume metoprolol XL 25 mg twice daily  Hyperlipidemia: (LDL goal < 70) -Controlled -History of PAD, CAD, TIA -Current treatment: Atorvastatin 20 mg daily: Appropriate, Effective, Safe, Accessible  -Medications previously tried: NA  -Recommended to continue current medication  Diabetes (A1c goal <8%) -Uncontrolled -Current medications: Lantus 20 units nightly (0.32 u/kg): Appropriate, Query effective  Trulicity 1.5 mg weekly (wednesdays): Appropriate, Query effective  -Medications previously tried: [place]  -Patient has received supply of Tresiba and Ozempic through [facility] but has not picked up the medications yet.  -Current home glucose readings fasting glucose: 279, 200s  -Reports hyperglycemic symptoms: polydipsia  -Current dietary Patterns: late night snacks of Peanut butter crackers or  chips. Often skips breakfast, light lunch. Larger supper.  -Candidate for SGLT-2 inhibitor, will defer until A1c improves.  -Patient will continue with current supply of Lantus and Trulicity prior to switching to [place].  -Increase Lantus to 25 units daily   Anxiety / Insomnia (Goal: Maintain symptom remission) -Controlled: Not addressed during this visit. -Current treatment: Hydroxyzine 25 mg three times daily as needed: Appropriate, Effective, Safe, Accessible Sertraline 25 mg daily: Appropriate, Effective, Safe, Accessible  Trazodone 100 mg 1/2 tablet nightly: Appropriate, Effective, Safe, Accessible  -  Medications previously tried/failed: NA -GAD7: 8 -Enjoys puzzles, word searches  -Recommended to continue current medication  Osteoporosis / Osteopenia (Goal Prevent fractures) -Uncontrolled: Not addressed during this visit. -Patient is a candidate for pharmacologic treatment due to T-Score < -2.5 in femoral neck -Current treatment   Calcium-Vitamin D twice daily: Appropriate, Effective, Safe, Accessible  Ergocalciferol 50,000 units weekly: Appropriate, Effective, Safe, Accessible  -Medications previously tried: NA  -Recheck Vitamin D  -Recommended to continue current medication  GERD (Goal: Prevent reflux) -Controlled: Not addressed during this visit. -Current treatment  Omeprazole 40 mg daily  -Medications previously tried: NA  -Excellent symptom control, rebound symptoms if skipping a day.  -Recommended to continue current medication  Overactive Bladder (Goal: Minimize symptoms) -Controlled: Not addressed during this visit. -Current treatment  Oxybutynin XL 10 mg daily  -Medications previously tried: NA -Waking up 2-3 times nightly, much better controlled with oxybutynin.  -Will see if symptom changes as hyperglycemia improves.   -Recommended to continue current medication  Patient Goals/Self-Care Activities Patient will:  - check glucose daily before breakfast, document, and provide at future appointments check blood pressure 2-3 times weekly, document, and provide at future appointments weigh daily, and contact provider if weight gain of greater than 2 pounds in 24 hours  Follow Up Plan: Face to Face appointment with care management team member scheduled for: 09/22/2021 at 10:00 AM     Patient agreed to services and verbal consent obtained.   Patient verbalizes understanding of instructions and care plan provided today and agrees to view in [place]. Active MyChart status and patient understanding of how to access instructions and care plan via MyChart confirmed with patient.     [name], PharmD, [place], CPP  Clinical Pharmacist Practitioner  [facility] [phone]

## 2021-08-30 ENCOUNTER — Other Ambulatory Visit: Payer: Self-pay | Admitting: Family Medicine

## 2021-08-30 DIAGNOSIS — R2242 Localized swelling, mass and lump, left lower limb: Secondary | ICD-10-CM

## 2021-09-01 NOTE — Progress Notes (Signed)
Established patient visit   Patient: Andrea [name]   DOB: [birth year]   76 y.o. Female  MRN: [id number] Visit Date: 09/03/2021  Today's healthcare provider: [name], FNP  Introduced to nurse practitioner role and practice setting.  All questions answered.  Discussed provider/patient relationship and expectations.   I,Tiffany J Bragg,acting as a scribe for [name], FNP.,have documented all relevant documentation on the behalf of [name], FNP,as directed by  [name], FNP while in the presence of [name], FNP.   Chief Complaint  Patient presents with   Diabetes   Subjective    HPI  Diabetes Mellitus Type II, Follow-up  Lab Results  Component Value Date   HGBA1C 10.4 (A) 09/03/2021   HGBA1C 11.3 06/03/2021   HGBA1C 11.3 (A) 06/02/2021   Wt Readings from Last 3 Encounters:  09/03/21 143 lb 8 oz (65.1 kg)  06/30/21 137 lb (62.1 kg)  06/02/21 137 lb 11.2 oz (62.5 kg)   Last seen for diabetes 3 months ago.  Management since then includes continue on 20U of Lantus QHS and 1.5 mg Trulicity weekly. She reports excellent compliance with treatment. She is not having side effects.  Symptoms: No fatigue No foot ulcerations  No appetite changes No nausea  No paresthesia of the feet  No polydipsia  No polyuria No visual disturbances   No vomiting     Home blood sugar records: fasting range: around 200s  Episodes of hypoglycemia? No    Current insulin regiment: Trulicity once weekly, Lantus nightly Most Recent Eye Exam: due 04/2022 Current exercise: housecleaning Current diet habits: well balanced  Pertinent Labs: Lab Results  Component Value Date   CHOL 136 06/03/2021   HDL 66 06/03/2021   LDLCALC 41 06/03/2021   TRIG 178 (A) 06/03/2021   CHOLHDL 2.1 06/02/2021   Lab Results  Component Value Date   NA 138 06/03/2021   K 5.0 06/03/2021   CREATININE 1.1 06/03/2021   EGFR 51 06/03/2021   MICROALBUR 20 12/16/2016      ---------------------------------------------------------------------------------------------------   Medications: Outpatient Medications Prior to Visit  Medication Sig   atorvastatin (LIPITOR) 20 MG tablet Take 1 tablet (20 mg total) by mouth at bedtime.   Calcium Carbonate-Vitamin D 600-400 MG-UNIT tablet Take 1 tablet by mouth daily.   clobetasol (TEMOVATE) 0.05 % external solution Apply 1 application. topically 2 (two) times daily.   Cyanocobalamin (B-12) 1000 MCG CAPS Take 1,000 mcg by mouth daily.   furosemide (LASIX) 20 MG tablet TAKE 1 TABLET BY MOUTH EVERY DAY   hydrOXYzine (ATARAX) 25 MG tablet TAKE 1 TABLET BY MOUTH THREE TIMES A DAY AS NEEDED   insulin degludec (TRESIBA FLEXTOUCH) 100 UNIT/ML FlexTouch Pen Inject 25 Units into the skin at bedtime. Patient receives via [name] Patient Assistance through December 2023   Insulin Pen Needle (TRUEPLUS PEN NEEDLES) 31G X 6 MM MISC Use with lantus two times daily.   INSULIN SYRINGE 1CC/29G 29G X 1/2" 1 ML MISC For lantus injections twice daily   losartan (COZAAR) 25 MG tablet TAKE 1 TABLET BY MOUTH EVERY DAY   metoprolol succinate (TOPROL-XL) 25 MG 24 hr tablet Take 1 tablet (25 mg total) by mouth 2 (two) times daily.   Multiple Vitamins-Minerals (PRESERVISION AREDS 2 PO) Take 1 tablet by mouth in the morning and at bedtime.   Omega-3 1000 MG CAPS Take 1,000 mg by mouth daily.   omeprazole (PRILOSEC) 40 MG capsule TAKE  1 CAPSULE BY MOUTH EVERY DAY   OneTouch Delica Lancets 82N MISC To check blood sugar daily  DX: E11.9   ONETOUCH VERIO test strip TO CHECK BLOOD SUGAR ONCE DAILY.   oxybutynin (DITROPAN-XL) 10 MG 24 hr tablet TAKE 1 TABLET BY MOUTH EVERYDAY AT BEDTIME   oxyCODONE (OXY IR/ROXICODONE) 5 MG immediate release tablet Take 1 tablet (5 mg total) by mouth every 4 (four) hours as needed for severe pain.   Semaglutide,0.25 or 0.5MG/DOS, (OZEMPIC, 0.25 OR 0.5 MG/DOSE,) 2 MG/3ML SOPN Inject 0.5 mg into the skin once a week.  Patient receives via [name] Patient Assistance through December 2023   sertraline (ZOLOFT) 25 MG tablet TAKE 1 TABLET (25 MG TOTAL) BY MOUTH DAILY.   traZODone (DESYREL) 100 MG tablet TAKE 1 TABLET BY MOUTH EVERYDAY AT BEDTIME   Vitamin D, Ergocalciferol, (DRISDOL) 1.25 MG (50000 UNIT) CAPS capsule TAKE ONE CAPSULE BY MOUTH ONCE WEEKLY ON THE SAME DAY EACH WEEK (ON FRIDAYS)   [DISCONTINUED] Alcohol Swabs (ALCOHOL PREP) PADS Use twice daily prior to SQ injection of insulin to clean skin   [DISCONTINUED] potassium chloride (KLOR-CON) 10 MEQ tablet TAKE 1 TABLET BY MOUTH EVERY DAY   No facility-administered medications prior to visit.    Review of Systems     Objective    BP 101/63 (BP Location: Left Arm, Patient Position: Sitting, Cuff Size: Normal)   Pulse 90   Temp 97.9 F (36.6 C) (Oral)   Resp 16   Ht 5' (1.524 m)   Wt 143 lb 8 oz (65.1 kg)   SpO2 93%   BMI 28.03 kg/m    Physical Exam Vitals and nursing note reviewed.  Constitutional:      General: She is not in acute distress.    Appearance: Normal appearance. She is overweight. She is not ill-appearing, toxic-appearing or diaphoretic.  HENT:     Head: Normocephalic and atraumatic.  Cardiovascular:     Rate and Rhythm: Normal rate and regular rhythm.     Pulses: Normal pulses.     Heart sounds: Normal heart sounds. No murmur heard.    No friction rub. No gallop.  Pulmonary:     Effort: Pulmonary effort is normal. No respiratory distress.     Breath sounds: Normal breath sounds. No stridor. No wheezing, rhonchi or rales.  Chest:     Chest wall: No tenderness.  Abdominal:     General: Bowel sounds are normal.     Palpations: Abdomen is soft.  Musculoskeletal:        General: No swelling, tenderness, deformity or signs of injury. Normal range of motion.     Right lower leg: No edema.     Left lower leg: No edema.  Skin:    General: Skin is warm and dry.     Capillary Refill: Capillary refill takes less than  2 seconds.     Coloration: Skin is not jaundiced or pale.     Findings: No bruising, erythema, lesion or rash.  Neurological:     General: No focal deficit present.     Mental Status: She is alert and oriented to person, place, and time. Mental status is at baseline.     Cranial Nerves: No cranial nerve deficit.     Sensory: No sensory deficit.     Motor: No weakness.     Coordination: Coordination normal.  Psychiatric:        Mood and Affect: Mood normal.  Behavior: Behavior normal.        Thought Content: Thought content normal.        Judgment: Judgment normal.      Results for orders placed or performed in visit on 09/03/21  POCT glycosylated hemoglobin (Hb A1C)  Result Value Ref Range   Hemoglobin A1C 10.4 (A) 4.0 - 5.6 %   Est. 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 Currently in 200s Continue to recommend balanced, lower carb meals. Smaller meal size, adding snacks. Choosing water as drink of choice and increasing purposeful exercise.       Type 2 diabetes mellitus with hyperlipidemia (H[facility])    Continue recommend diet low in saturated fat and regular exercise - 30 min at least 5 times per week Not currently on statin; Stroke/MI risk elevated with HTN, HLD, DM and previous history      Relevant Medications   Alcohol Swabs (ALCOHOL PADS) 70 % PADS   Other Relevant Orders   POCT glycosylated hemoglobin (Hb A1C) (Completed)     Other   Dependent on wheelchair   Impaired mobility    Multiple co-morbid conditions including HFrEF, HTN, HLD, DM and previous ulceration  requiring extensive healing time Request for new w/c         Return in about 3 months (around 12/04/2021) for chonic disease management.      Vonna Kotyk, FNP, have reviewed all documentation for this visit. The documentation on 09/03/21 for the exam, diagnosis, procedures, and orders are all accurate and complete.    [name], [place] [phone] (phone) [phone] (fax)  [place]

## 2021-09-03 ENCOUNTER — Ambulatory Visit (INDEPENDENT_AMBULATORY_CARE_PROVIDER_SITE_OTHER): Payer: Medicare Other | Admitting: Family Medicine

## 2021-09-03 ENCOUNTER — Encounter: Payer: Self-pay | Admitting: Family Medicine

## 2021-09-03 VITALS — BP 101/63 | HR 90 | Temp 97.9°F | Resp 16 | Ht 60.0 in | Wt 143.5 lb

## 2021-09-03 DIAGNOSIS — E1159 Type 2 diabetes mellitus with other circulatory complications: Secondary | ICD-10-CM | POA: Diagnosis not present

## 2021-09-03 DIAGNOSIS — E1169 Type 2 diabetes mellitus with other specified complication: Secondary | ICD-10-CM | POA: Diagnosis not present

## 2021-09-03 DIAGNOSIS — E785 Hyperlipidemia, unspecified: Secondary | ICD-10-CM | POA: Diagnosis not present

## 2021-09-03 DIAGNOSIS — Z7409 Other reduced mobility: Secondary | ICD-10-CM | POA: Diagnosis not present

## 2021-09-03 DIAGNOSIS — E11621 Type 2 diabetes mellitus with foot ulcer: Secondary | ICD-10-CM | POA: Insufficient documentation

## 2021-09-03 DIAGNOSIS — Z993 Dependence on wheelchair: Secondary | ICD-10-CM | POA: Insufficient documentation

## 2021-09-03 DIAGNOSIS — L97509 Non-pressure chronic ulcer of other part of unspecified foot with unspecified severity: Secondary | ICD-10-CM

## 2021-09-03 DIAGNOSIS — I152 Hypertension secondary to endocrine disorders: Secondary | ICD-10-CM | POA: Diagnosis not present

## 2021-09-03 LAB — POCT GLYCOSYLATED HEMOGLOBIN (HGB A1C)
Est. average glucose Bld gHb Est-mCnc: 252
Hemoglobin A1C: 10.4 % — AB (ref 4.0–5.6)

## 2021-09-03 MED ORDER — ALCOHOL PADS 70 % PADS: 1.0000 | MEDICATED_PAD | Freq: Two times a day (BID) | 11 refills | Status: DC

## 2021-09-03 NOTE — Assessment & Plan Note (Signed)
Multiple co-morbid conditions including HFrEF, HTN, HLD, DM and previous ulceration requiring extensive healing time Request for new w/c

## 2021-09-03 NOTE — Assessment & Plan Note (Signed)
Chronic, stable Continue lasix 20 mg, losartan 25 mg, metop xl 25 mg Goal <130/<80

## 2021-09-03 NOTE — Assessment & Plan Note (Signed)
Continue recommend diet low in saturated fat and regular exercise - 30 min at least 5 times per week Not currently on statin; Stroke/MI risk elevated with HTN, HLD, DM and previous history

## 2021-09-03 NOTE — Assessment & Plan Note (Signed)
Now healed; continue to recommend lower FBG goals, closer to 150. Currently in 200s Continue to recommend balanced, lower carb meals. Smaller meal size, adding snacks. Choosing water as drink of choice and increasing purposeful exercise.

## 2021-09-04 DIAGNOSIS — E1159 Type 2 diabetes mellitus with other circulatory complications: Secondary | ICD-10-CM | POA: Diagnosis not present

## 2021-09-04 DIAGNOSIS — M8580 Other specified disorders of bone density and structure, unspecified site: Secondary | ICD-10-CM | POA: Diagnosis not present

## 2021-09-04 DIAGNOSIS — M810 Age-related osteoporosis without current pathological fracture: Secondary | ICD-10-CM

## 2021-09-04 DIAGNOSIS — I5020 Unspecified systolic (congestive) heart failure: Secondary | ICD-10-CM | POA: Diagnosis not present

## 2021-09-04 DIAGNOSIS — E785 Hyperlipidemia, unspecified: Secondary | ICD-10-CM | POA: Diagnosis not present

## 2021-09-10 ENCOUNTER — Telehealth: Payer: Self-pay

## 2021-09-10 DIAGNOSIS — R262 Difficulty in walking, not elsewhere classified: Secondary | ICD-10-CM | POA: Diagnosis not present

## 2021-09-10 NOTE — Progress Notes (Signed)
Chronic Care Management Pharmacy Assistant   Name: CEAIRRA MCCARVER  MRN: [id number] DOB: Feb 06, 1946  Reason for Encounter: Follow-up with patient regarding her blood sugar numbers and confirm medication strength  I received a task from [name], CPP requesting that I contact the patient and see if she can provide a weeks worth of her glucometer readings and also confirm her medication strength. Per CPP patient is transitioning from Lantus and Trulicity to [name] and Ozempic.   I reached out to the patient, but I had to leave a HIPAA compliant voicemail requesting that the patient give me a call back.  09/11/2021  I spoke with the patient and she advised she is doing well. Patient has not yet started the [place]. Per patient she still has some lantus and Trulicity left but she will be starting the new medications next week. I tried walking the patient over the phone to access her blood sugar number history on her glucose meter but she stated she just isn't tech savvy and could not figure it out. She does report her fasting blood sugar this morning was 180. Per patient she did eat at the waffle house for dinner last night. She had a waffle, eggs, bacon, toast and hash browns. Patient has not yet had anything to eat today. I informed patient that her fasting blood sugars are a little elevate and it probably has something to do with the toast, waffle and hash browns she ate last night.   Patient advised that I would follow-up with her in a week or so after she starts the new medication regiment and if she could just keep a list of her blood sugar readings on a piece of paper since that will be much easier for her to access than the machine. Patient is agreeable.   Patient does have a follow-up with CPP on 09/22/2021  CPP will be notified.   Medications: Outpatient Encounter Medications as of 09/10/2021  Medication Sig Note   Alcohol Swabs (ALCOHOL PADS) 70 % PADS 1 each by  Does not apply route in the morning and at bedtime.    atorvastatin (LIPITOR) 20 MG tablet Take 1 tablet (20 mg total) by mouth at bedtime.    Calcium Carbonate-Vitamin D 600-400 MG-UNIT tablet Take 1 tablet by mouth daily.    clobetasol (TEMOVATE) 0.05 % external solution Apply 1 application. topically 2 (two) times daily.    Cyanocobalamin (B-12) 1000 MCG CAPS Take 1,000 mcg by mouth daily.    furosemide (LASIX) 20 MG tablet TAKE 1 TABLET BY MOUTH EVERY DAY    hydrOXYzine (ATARAX) 25 MG tablet TAKE 1 TABLET BY MOUTH THREE TIMES A DAY AS NEEDED    insulin degludec (TRESIBA FLEXTOUCH) 100 UNIT/ML FlexTouch Pen Inject 25 Units into the skin at bedtime. Patient receives via [name] Patient Assistance through December 2023 08/25/2021: Finishing supply of Lantus   Insulin Pen Needle (TRUEPLUS PEN NEEDLES) 31G X 6 MM MISC Use with lantus two times daily.    INSULIN SYRINGE 1CC/29G 29G X 1/2" 1 ML MISC For lantus injections twice daily    losartan (COZAAR) 25 MG tablet TAKE 1 TABLET BY MOUTH EVERY DAY    metoprolol succinate (TOPROL-XL) 25 MG 24 hr tablet Take 1 tablet (25 mg total) by mouth 2 (two) times daily.    Multiple Vitamins-Minerals (PRESERVISION AREDS 2 PO) Take 1 tablet by mouth in the morning and at bedtime.    Omega-3 1000 MG CAPS Take 1,000  mg by mouth daily.    omeprazole (PRILOSEC) 40 MG capsule TAKE 1 CAPSULE BY MOUTH EVERY DAY    OneTouch Delica Lancets 16X MISC To check blood sugar daily  DX: E11.9    ONETOUCH VERIO test strip TO CHECK BLOOD SUGAR ONCE DAILY.    oxybutynin (DITROPAN-XL) 10 MG 24 hr tablet TAKE 1 TABLET BY MOUTH EVERYDAY AT BEDTIME    oxyCODONE (OXY IR/ROXICODONE) 5 MG immediate release tablet Take 1 tablet (5 mg total) by mouth every 4 (four) hours as needed for severe pain.    Semaglutide,0.25 or 0.'5MG'$ /DOS, (OZEMPIC, 0.25 OR 0.5 MG/DOSE,) 2 MG/3ML SOPN Inject 0.5 mg into the skin once a week. Patient receives via [name] Patient Assistance through December  2023 08/25/2021: Finishing supply of Trulicity   sertraline (ZOLOFT) 25 MG tablet TAKE 1 TABLET (25 MG TOTAL) BY MOUTH DAILY.    traZODone (DESYREL) 100 MG tablet TAKE 1 TABLET BY MOUTH EVERYDAY AT BEDTIME    Vitamin D, Ergocalciferol, (DRISDOL) 1.25 MG (50000 UNIT) CAPS capsule TAKE ONE CAPSULE BY MOUTH ONCE WEEKLY ON THE SAME DAY EACH WEEK (ON FRIDAYS)    No facility-administered encounter medications on file as of 09/10/2021.    Lynann Bologna, CPA/CMA Clinical Pharmacist Assistant Phone: [phone]

## 2021-09-16 ENCOUNTER — Ambulatory Visit: Payer: Self-pay | Admitting: *Deleted

## 2021-09-16 NOTE — Telephone Encounter (Signed)
Message from [place] sent at 09/16/2021  1:17 PM EDT  Summary: medication questions   Patient called in asking if the [place] is for one use only and how to use the pens. She also asked why is there 6 pens and 1 pill. Please call back           Call History   Type Contact Phone/Fax User  09/16/2021 01:16 PM EDT Phone (Incoming) Amiel, [place] (Self) [phone] Jerilynn Mages) Camille Bal, Gerlene Burdock   Reason for Disposition  [1] Caller has URGENT medicine question about med that PCP or specialist prescribed AND [2] triager unable to answer question    Ozempic questions  Answer Assessment - Initial Assessment Questions 1. NAME of MEDICATION: "What medicine are you calling about?"     Ozempic 2. QUESTION: "What is your question?" (e.g., double dose of medicine, side effect)     Is Ozempic a one time use?  Why are there 6 pens and 1 pill?  How do I use the Ozempic? 3. PRESCRIBING HCP: "Who prescribed it?" Reason: if prescribed by specialist, call should be referred to that group.     [name], FNP 4. SYMPTOMS: "Do you have any symptoms?"     N/A 5. SEVERITY: If symptoms are present, ask "Are they mild, moderate or severe?"     N/A 6. PREGNANCY:  "Is there any chance that you are pregnant?" "When was your last menstrual period?"     N/A  Protocols used: Medication Question Call-A-AH

## 2021-09-16 NOTE — Telephone Encounter (Signed)
  Chief Complaint: Pt asking how to use the Ozempic pens.  Why are there 6 pens and 1 pill?  Needing clarification/instructions on use of Ozempic.   Symptoms: N/A Frequency: N/A Pertinent Negatives: Patient denies N/A Disposition: '[]'$ ED /'[]'$ Urgent Care (no appt availability in office) / '[]'$ Appointment(In office/virtual)/ '[]'$  [place] [facility]/ '[]'$ Home Care/ '[]'$ Refused Recommended Disposition /'[]'$ [place] [facility]/ '[x]'$  Follow-up with PCP Additional Notes: See pt's note where she called in with questions regarding the Ozempic.

## 2021-09-17 ENCOUNTER — Telehealth: Payer: Self-pay

## 2021-09-17 NOTE — Telephone Encounter (Signed)
  Care Management   Follow Up Note   09/17/2021 Name: Andrea [name] MRN: [id number] DOB: [birth year]   Primary Care Provider: [name], FNP Reason for referral : Chronic Care Management   An unsuccessful telephone outreach was attempted today. The patient was referred to the case management team for assistance with care management and care coordination.    Follow Up Plan:  A HIPAA compliant voice message was left today requesting a return call.   [facility] Care Management [phone]

## 2021-09-21 ENCOUNTER — Telehealth: Payer: Self-pay | Admitting: Family Medicine

## 2021-09-21 NOTE — Telephone Encounter (Signed)
Pt is calling to reschedule her pharmacist appt. Cb- [phone]

## 2021-09-22 ENCOUNTER — Telehealth: Payer: Medicare Other

## 2021-09-22 ENCOUNTER — Ambulatory Visit: Payer: Medicare Other

## 2021-09-25 ENCOUNTER — Telehealth: Payer: Self-pay

## 2021-09-25 ENCOUNTER — Telehealth: Payer: Medicare Other

## 2021-09-25 NOTE — Telephone Encounter (Signed)
  Care Management   Follow Up Note   09/25/2021 Name: Andrea [name] MRN: [id number] DOB: 1946/01/17   Primary Care Provider: [name], FNP Reason for referral : Chronic Care Management   An unsuccessful telephone outreach was attempted today. The patient was referred to the case management team for assistance with care management and care coordination.    Follow Up Plan:  A HIPAA compliant voice message was left today requesting a return call.   [facility] Care Management [phone]

## 2021-09-26 ENCOUNTER — Other Ambulatory Visit: Payer: Self-pay | Admitting: Family Medicine

## 2021-09-26 DIAGNOSIS — N3281 Overactive bladder: Secondary | ICD-10-CM

## 2021-09-26 DIAGNOSIS — K219 Gastro-esophageal reflux disease without esophagitis: Secondary | ICD-10-CM

## 2021-10-09 ENCOUNTER — Other Ambulatory Visit: Payer: Self-pay | Admitting: Family Medicine

## 2021-10-09 DIAGNOSIS — F419 Anxiety disorder, unspecified: Secondary | ICD-10-CM

## 2021-10-09 NOTE — Telephone Encounter (Signed)
Requested Prescriptions  Pending Prescriptions Disp Refills  . sertraline (ZOLOFT) 25 MG tablet [Pharmacy Med Name: SERTRALINE HCL 25 MG TABLET] 90 tablet 0    Sig: TAKE 1 TABLET (25 MG TOTAL) BY MOUTH DAILY.     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 [facility])   [facility] [name] T, FNP   4 months ago Type 2 diabetes mellitus with hyperlipidemia [facility])   [facility] [name] T, FNP   6 months ago Chronic heel pain, left   [facility] [name] T, FNP   7 months ago 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 edema, with long-term current use of insulin [facility])   [facility] [name], FNP   8 months ago Hospital discharge follow-up   [facility] [name], FNP      Future Appointments            In 2 months [name], [place], [place]

## 2021-10-12 NOTE — Progress Notes (Signed)
    Chronic Care Management Pharmacy Assistant   Name: Andrea [name]  MRN: [id number] DOB: 03/03/1946  Patient called to be reminded of her face to face appointment with [name], CPP on 10/13/2021 @ 1300.  Patient aware of appointment date, time, and type of appointment (either telephone or in person). Patient aware to have/bring all medications, supplements, blood pressure and/or blood sugar logs to visit.  Star Rating Drug: Atorvastatin 20 mg last filled on 07/27/2021 for a 90-Day supply with [facility] Losartan 25 mg last filled on 06/09/2021 for a 90-Day supply with [facility] Ozempic 0.5 mg patient on patient assistance for this medication with Novo Nordisk  Any gaps in medications fill history? Yes  Care Gaps: Tetanus/TDAP Dexa Scan Diabetic Foot Exam Influenza Vaccine A1C>9 last lab was 4 months ago   Lynann Bologna, [place] Pharmacist Assistant Phone: [phone]

## 2021-10-13 ENCOUNTER — Ambulatory Visit (INDEPENDENT_AMBULATORY_CARE_PROVIDER_SITE_OTHER): Payer: Medicare Other

## 2021-10-13 DIAGNOSIS — E1159 Type 2 diabetes mellitus with other circulatory complications: Secondary | ICD-10-CM

## 2021-10-13 DIAGNOSIS — E1169 Type 2 diabetes mellitus with other specified complication: Secondary | ICD-10-CM

## 2021-10-13 NOTE — Progress Notes (Signed)
Chronic Care Management Pharmacy Note  10/15/2021 Name:  Andrea [name] MRN:  [id number] DOB:  12-14-1945  Summary: Patient presents for CCM follow-up. Patient presents in office alongside [place], patient's daughter.   -Extensive dietary counseling discussed with patient and her daughter today. Patient will plan to work on low-carbohydrate snacks.   Interested in [employer], did not have medications available during our visit to onboard patient.   Recommendations/Changes made from today's visit: -Increase Tresiba to 32 units daily   Plan: CPP follow-up 1 month  Subjective: Andrea [name] is an 76 y.o. year old female who is a primary patient of [name], FNP.  The CCM team was consulted for assistance with disease management and care coordination needs.    Engaged with patient by telephone for follow up visit in response to provider referral for pharmacy case management and/or care coordination services.   Consent to Services:  The patient was given information about Chronic Care Management services, agreed to services, and gave verbal consent prior to initiation of services.  Please see initial visit note for detailed documentation.   Patient Care Team: [name], FNP as PCP - General (Family Medicine) Minna Merritts, MD as PCP - Cardiology (Cardiology) Estill Cotta, MD as Consulting Physician (Ophthalmology) Newman Pies, MD as Consulting Physician (Neurosurgery) Lucky Cowboy Erskine Squibb, MD as Referring Physician (Vascular Surgery) [name], MD as Consulting Physician (Gastroenterology) Germaine Pomfret, [facility] (Pharmacist)  Recent office visits: 09/03/21: Patient presented to [name], FNP for follow-up. A1c 10.4%.   06/02/2021 [name], FNP (PCP Office Visit) for Type II DM- Stopped: Amoxicillin due to patient not taking, Started: Clobetasol Propionate 3.22% 1 application daily, Lab orders placed, Referral for Dermatology placed, Patient to  follow-up in 4 months     Recent consult visits: 05/26/2021 Daylene Katayama, DPM (Podiatry) for Diabetic Ulcer- No medication changes noted, No orders placed, Patient to follow-up as needed   04/28/2021 Ida Rogue, MD (Cardiology) for 1 year follow-up- Changed: Metoprolol Succinate 25 mg 1 tablet daily to twice daily, EKG 12-Lead ordered, Patient to follow-up in 1 year   04/21/2021 Daylene Katayama, DPM (Podiatry) for Diabetic Ulcer- No medication changes noted, DG Foot Complete Left ordered, Patient to follow-up in 4 weeks   03/24/2021 Daylene Katayama, DPM (Podiatry) for Wound Check-No medication changes noted, DG Foot Complete Left ordered, Patient to follow-up in 4 weeks   Hospital visits: None in previous 6 months   Objective:  Lab Results  Component Value Date   CREATININE 1.1 06/03/2021   BUN 30 (H) 06/02/2021   EGFR 51 06/03/2021   GFRNONAA 46 (L) 12/22/2020   GFRAA 70 02/11/2020   NA 138 06/03/2021   K 5.0 06/03/2021   CALCIUM 9.4 06/03/2021   CO2 22 06/02/2021   GLUCOSE 243 (H) 06/02/2021    Lab Results  Component Value Date/Time   HGBA1C 10.4 (A) 09/03/2021 02:21 PM   HGBA1C 11.3 06/03/2021 04:44 PM   HGBA1C 11.3 (A) 06/02/2021 01:59 PM   HGBA1C 11.3 (H) 06/02/2021 12:00 AM   HGBA1C 7.5 (H) 11/17/2020 10:34 PM   HGBA1C 7.5 (H) 11/26/2013 04:14 AM   MICROALBUR 20 12/16/2016 03:10 PM    Last diabetic Eye exam:  Lab Results  Component Value Date/Time   HMDIABEYEEXA No Retinopathy 04/08/2021 12:00 AM    Last diabetic Foot exam: No results found for: "HMDIABFOOTEX"   Lab Results  Component Value Date   CHOL 136 06/03/2021   HDL 66 06/03/2021  LDLCALC 41 06/03/2021   TRIG 178 (A) 06/03/2021   CHOLHDL 2.1 06/02/2021       Latest Ref Rng & Units 06/03/2021    4:44 PM 06/02/2021   12:00 AM 12/18/2020   10:22 AM  Hepatic Function  Total Protein 6.0 - 8.5 g/dL  6.7  6.3   Albumin 3.5 - 5.0 4.2     4.2  2.6   AST 13 - 35 _0 ALT 7 - 35 U/L _1 Alk Phosphatase 44 - 121 IU/L  144  130   Total Bilirubin 0.0 - 1.2 mg/dL  0.6  0.8      This result is from an external source.    Lab Results  Component Value Date/Time   TSH 1.88 06/03/2021 04:44 PM   TSH 1.880 06/02/2021 12:00 AM   TSH 1.220 12/28/2018 04:26 PM   FREET4 1.33 06/02/2021 12:00 AM       Latest Ref Rng & Units 12/20/2020    4:49 AM 12/19/2020    5:45 AM 12/18/2020   10:22 AM  CBC  WBC 4.0 - 10.5 K/uL 7.6  7.0  9.5   Hemoglobin 12.0 - 15.0 g/dL 10.5  10.4  11.4   Hematocrit 36.0 - 46.0 % 32.7  31.8  35.1   Platelets 150 - 400 K/uL 211  189  188     No results found for: "VD25OH"  Clinical ASCVD: Yes  The ASCVD Risk score (Arnett DK, et al., 2019) failed to calculate for the following reasons:   The patient has a prior MI or stroke diagnosis       06/30/2021    1:04 PM 11/17/2020    4:03 PM 06/05/2020   11:20 AM  Depression screen PHQ 2/9  Decreased Interest 0 0 0  Down, Depressed, Hopeless 0 0 0  PHQ - 2 Score 0 0 0  Altered sleeping   0  Tired, decreased energy   0  Change in appetite   0  Feeling bad or failure about yourself    0  Trouble concentrating   0  Moving slowly or fidgety/restless   0  Suicidal thoughts   0  PHQ-9 Score   0  Difficult doing work/chores   Not difficult at all    -Last DEXA Scan: 03/07/18   T-Score femoral neck: -3.0  T-Score total hip: NA  T-Score lumbar spine: -0.4  T-Score forearm radius: -4.0  Social History   Tobacco Use  Smoking Status Former   Packs/day: 1.00   Years: 30.00   Total pack years: 30.00   Types: Cigarettes   Quit date: 07/17/2012   Years since quitting: 9.2  Smokeless Tobacco Never  Tobacco Comments       BP Readings from Last 3 Encounters:  09/03/21 101/63  06/02/21 115/71  04/28/21 (!) 120/50   Pulse Readings from Last 3 Encounters:  09/03/21 90  06/02/21 91  04/28/21 97   Wt Readings from Last 3 Encounters:  09/03/21 143 lb 8 oz (65.1 kg)  06/30/21 137 lb (62.1 kg)   06/02/21 137 lb 11.2 oz (62.5 kg)   BMI Readings from Last 3 Encounters:  09/03/21 28.03 kg/m  06/30/21 26.76 kg/m  06/02/21 26.89 kg/m    Assessment/Interventions: Review of patient past medical history, allergies, medications, health status, including review of consultants reports, laboratory and other test data, was performed as part of  comprehensive evaluation and provision of chronic care management services.   SDOH:  (Social Determinants of Health) assessments and interventions performed: Yes   SDOH Screenings   Alcohol Screen: Low Risk  (06/30/2021)   Alcohol Screen    Last Alcohol Screening Score (AUDIT): 0  Depression (PHQ2-9): Low Risk  (06/30/2021)   Depression (PHQ2-9)    PHQ-2 Score: 0  Financial Resource Strain: High Risk (07/06/2021)   Overall Financial Resource Strain (CARDIA)    Difficulty of Paying Living Expenses: Hard  Food Insecurity: No Food Insecurity (06/30/2021)   Hunger Vital Sign    Worried About Running Out of Food in the Last Year: Never true    Ran Out of Food in the Last Year: Never true  Housing: Low Risk  (06/30/2021)   Housing    Last Housing Risk Score: 0  Physical Activity: Sufficiently Active (06/30/2021)   Exercise Vital Sign    Days of Exercise per Week: 7 days    Minutes of Exercise per Session: 30 min  Social Connections: Moderately Isolated (06/30/2021)   Social Connection and Isolation Panel [NHANES]    Frequency of Communication with Friends and Family: More than three times a week    Frequency of Social Gatherings with Friends and Family: Once a week    Attends Religious Services: Never    Marine scientist or Organizations: No    Attends Archivist Meetings: Never    Marital Status: Married  Stress: No Stress Concern Present (06/30/2021)   [place]    Feeling of Stress : Not at all  Tobacco Use: Medium Risk (09/03/2021)   Patient History     Smoking Tobacco Use: Former    Smokeless Tobacco Use: Never    Passive Exposure: Not on file  Transportation Needs: No Transportation Needs (07/06/2021)   PRAPARE - Hydrologist (Medical): No    Lack of Transportation (Non-Medical): No    CCM Care Plan  Allergies  Allergen Reactions   Chlorhexidine    Metformin And Related Diarrhea   Sulfa Antibiotics Rash    Medications Reviewed Today     Reviewed by [name], FNP (Family Nurse Practitioner) on 09/03/21 at 1643  Med List Status: <None>   Medication Order Taking? Sig Documenting Provider Last Dose Status Informant  Alcohol Swabs (ALCOHOL PADS) 70 % PADS [id number] Yes 1 each by Does not apply route in the morning and at bedtime. [name], FNP  Active   atorvastatin (LIPITOR) 20 MG tablet [id number] Yes Take 1 tablet (20 mg total) by mouth at bedtime. [name], FNP Taking Active   Calcium Carbonate-Vitamin D 600-400 MG-UNIT tablet [id number] Yes Take 1 tablet by mouth daily. [provider] Taking Active Nursing Home Medication Administration Guide (MAG)  clobetasol (TEMOVATE) 0.05 % external solution [id number] Yes Apply 1 application. topically 2 (two) times daily. [name], FNP Taking Active   Cyanocobalamin (B-12) 1000 MCG CAPS [id number] Yes Take 1,000 mcg by mouth daily. [provider] Taking Active Nursing Home Medication Administration Guide (MAG)  furosemide (LASIX) 20 MG tablet [id number] Yes TAKE 1 TABLET BY MOUTH EVERY DAY [name], FNP Taking Active   hydrOXYzine (ATARAX) 25 MG tablet [id number] Yes TAKE 1 TABLET BY MOUTH THREE TIMES A DAY AS NEEDED [name], FNP Taking Active   insulin degludec (TRESIBA FLEXTOUCH) 100 UNIT/ML FlexTouch Pen [id number] Yes Inject 25 Units into the  skin at bedtime. Patient receives via [name] Patient Assistance through December 2023 [name], FNP Taking Active            Med Note ([place]  Aug 25, 2021 10:45 AM) Finishing supply of Lantus  Insulin Pen Needle (TRUEPLUS PEN NEEDLES) 31G X 6 MM MISC [id number] Yes Use with lantus two times daily. Virginia Crews, MD Taking Active Nursing Home Medication Administration Guide (MAG)  INSULIN SYRINGE 1CC/29G 29G X 1/2" 1 ML MISC [id number] Yes For lantus injections twice daily Mar Daring, PA-C Taking Active Nursing Home Medication Administration Guide (MAG)  losartan (COZAAR) 25 MG tablet [id number] Yes TAKE 1 TABLET BY MOUTH EVERY DAY [name], FNP Taking Active   metoprolol succinate (TOPROL-XL) 25 MG 24 hr tablet [id number] Yes Take 1 tablet (25 mg total) by mouth 2 (two) times daily. Minna Merritts, MD Taking Active   Multiple Vitamins-Minerals (PRESERVISION AREDS 2 PO) [id number] Yes Take 1 tablet by mouth in the morning and at bedtime. [provider] Taking Active Nursing Home Medication Administration Guide (MAG)  Omega-3 1000 MG CAPS [id number] Yes Take 1,000 mg by mouth daily. [provider] Taking Active Nursing Home Medication Administration Guide (MAG)  omeprazole (PRILOSEC) 40 MG capsule [id number] Yes TAKE 1 CAPSULE BY MOUTH EVERY DAY [name], FNP Taking Active   OneTouch Delica Lancets 51Z [place] [id number] Yes To check blood sugar daily  DX: E11.9 Virginia Crews, MD Taking Active Nursing Home Medication Administration Guide (MAG)  ONETOUCH VERIO test strip [id number] Yes TO CHECK BLOOD SUGAR ONCE DAILY. [name], FNP Taking Active   oxybutynin (DITROPAN-XL) 10 MG 24 hr tablet [id number] Yes TAKE 1 TABLET BY MOUTH EVERYDAY AT BEDTIME [name], FNP Taking Active   oxyCODONE (OXY IR/ROXICODONE) 5 MG immediate release tablet [id number] Yes Take 1 tablet (5 mg total) by mouth every 4 (four) hours as needed for severe pain. [name], FNP Taking Active   Semaglutide,0.25 or 0.5MG /DOS, (OZEMPIC, 0.25 OR 0.5 MG/DOSE,) 2 MG/3ML SOPN [id number] Yes Inject 0.5 mg into the skin once  a week. Patient receives via [name] Patient Assistance through December 2023 [name], FNP Taking Active            Med Note ([place] Aug 25, 2021 10:45 AM) Finishing supply of Trulicity  sertraline (ZOLOFT) 25 MG tablet [id number] Yes TAKE 1 TABLET (25 MG TOTAL) BY MOUTH DAILY. [name], FNP Taking Active   traZODone (DESYREL) 100 MG tablet [id number] Yes TAKE 1 TABLET BY MOUTH EVERYDAY AT BEDTIME [name], FNP Taking Active   Vitamin D, Ergocalciferol, (DRISDOL) 1.25 MG (50000 UNIT) CAPS capsule [id number] Yes TAKE ONE CAPSULE BY MOUTH ONCE WEEKLY ON THE SAME DAY EACH WEEK (ON FRIDAYS) [name], FNP Taking Active   Med List Note Loann Quill, CPhT 12/18/20 1047): [employer] [phone]            Patient Active Problem List   Diagnosis Date Noted   Dependent on wheelchair 09/03/2021   Impaired mobility 09/03/2021   Hypertension associated with diabetes (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 ([place]) 09/03/2021   Need for pneumococcal vaccination 06/02/2021   Hair loss 06/02/2021   Hyperlipidemia LDL goal <70 04/28/2021   Chronic heel pain, left 04/02/2021   MRSA (methicillin resistant staph aureus) culture positive 02/05/2021   Hospital  discharge follow-up 01/23/2021   Weight loss, unintentional 01/23/2021   Primary insomnia 01/23/2021   Loss of appetite for more than 2 weeks 01/23/2021   Hypomagnesemia    Hypokalemia    Essential hypertension    Tachycardia    Depression    Acute metabolic encephalopathy 11/65/7903   Acute kidney injury superimposed on CKD ([place]) 12/18/2020   Delirium 83/33/8329   Chronic systolic CHF (congestive heart failure) ([place]) 12/18/2020   Osteomyelitis of foot, left, acute ([place]) 12/18/2020   Status post peripherally inserted central catheter (PICC) central line placement    Osteomyelitis ([place]) 11/17/2020   Cellulitis of left lower extremity 11/13/2020    Hypoglycemia 11/13/2020   Non healing left heel wound 11/13/2020   Status post total hip replacement, 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   Pain due to onychomycosis of toenail of left foot 03/06/2020   Malaise and fatigue 12/28/2019   UTI (urinary tract infection) 12/28/2019   Hyponatremia 12/27/2019   Intertrigo 12/27/2019   History of meningitis 11/23/2019   CSF leak from nose 11/22/2019   History of myocardial infarction 03/09/2019   PAD (peripheral artery disease) ([place]) 12/28/2018   Leg length discrepancy 09/21/2017   Coronary artery disease involving native coronary artery 01/06/2017   Drug-induced constipation 01/06/2017   Spinal stenosis of lumbar region 01/06/2017   Spondylolisthesis of lumbar region 12/30/2016   Obese 04/16/2015   History of colon polyps 11/23/2012   Bundle branch block, 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   Personal history of transient ischemic attack (TIA), and cerebral infarction without residual deficits 11/23/2012   Encounter for long-term (current) use of aspirin 11/23/2012   Status post percutaneous transluminal coronary angioplasty 11/23/2012   Transient ischemic attack (TIA), and cerebral infarction without residual deficits(V12.54) 11/23/2012   Anemia 08/17/2012   Chronic obstructive pulmonary disease ([place]) 08/17/2012   HLD (hyperlipidemia) 08/17/2012   OP (osteoporosis) 08/17/2012   Type 2 diabetes mellitus with hyperlipidemia ([place]) 08/17/2012   Ischemic heart disease due to coronary artery obstruction ([place]) 07/24/2012    Immunization History  Administered Date(s) Administered   Fluad Quad(high Dose 65+) 12/01/2020   Influenza, High Dose Seasonal PF 02/12/2016, 12/16/2016, 12/19/2017   Influenza,inj,Quad PF,6+ Mos 12/09/2019   PFIZER(Purple Top)SARS-COV-2 Vaccination 07/14/2019, 08/14/2019   Pfizer Covid-19 Vaccine Bivalent Booster 5y-11y 01/27/2021   Pneumococcal Polysaccharide-23 06/02/2021   Zoster Recombinat (Shingrix) 05/07/2018, 11/21/2018    Conditions to be  addressed/monitored:  Hypertension, Hyperlipidemia, Diabetes, Heart Failure, Coronary Artery Disease, GERD, COPD, Anxiety, Osteoporosis, and Insomnia, History of TIA  Care Plan : General Pharmacy (Adult)  Updates made by Germaine Pomfret, RPH since 10/15/2021 12:00 AM     Problem: Hypertension, Hyperlipidemia, Diabetes, Heart Failure, Coronary Artery Disease, GERD, COPD, Anxiety, Osteoporosis, and Insomnia, History of TIA   Priority: High     Long-Range Goal: Patient-Specific Goal   Start Date: 06/23/2021  Expected End Date: 07/07/2022  This Visit's Progress: On track  Recent Progress: On track  Priority: High  Note:   Current Barriers:  Unable to independently afford treatment regimen Unable to achieve control of diabetes   Pharmacist Clinical Goal(s):  Patient will verbalize ability to afford treatment regimen achieve control of diabetes as evidenced by A1c less than 8% through collaboration with PharmD and provider.   Interventions: 1:1 collaboration with [name], FNP regarding development and update of comprehensive plan of care as evidenced by provider attestation and co-signature Inter-disciplinary care team collaboration (see longitudinal plan of care) Comprehensive medication review performed; medication list updated in electronic medical record  Heart Failure (Goal: manage symptoms and prevent exacerbations) -Controlled: Not addressed during this visit. -Last ejection fraction: 40-45% (Date: Jan 2023) -HF type: Systolic -NYHA Class: III (marked limitation of activity) -Current treatment: Furosemide 20 mg daily: Appropriate, Effective, Safe, Accessible Losartan 25 mg daily: Appropriate, Effective, Safe, Accessible  Metoprolol XL 25 mg daily: Appropriate, Effective, Safe, Accessible  -Medications previously tried: NA  -Only taking metoprolol 25 mg daily due to not understanding instructions.  -Current home BP/HR readings: Not monitoring -Resume metoprolol XL 25  mg twice daily  Hyperlipidemia: (LDL goal < 70) -Controlled -History of PAD, CAD, TIA -Current treatment: Atorvastatin 20 mg daily: Appropriate, Effective, Safe, Accessible  -Medications previously tried: NA  -Recommended to continue current medication  Diabetes (A1c goal <8%) -Uncontrolled -Current medications: Tresiba 30 units daily (0.38 u/kg): Appropriate, Query effective  Ozempic 0.5 mg weekly (wednesdays): Appropriate, Query effective  -Medications previously tried: [place]  -Patient has received supply of Tresiba and Ozempic through PAP but has not picked up the medications yet.  -Current home glucose readings fasting glucose: 159, 105, 187, 183, 161, 176, 148, 263, 173, 161  -Reports hyperglycemic symptoms: polydipsia  -Current dietary Patterns: late night snacks of Peanut butter crackers or  chips. Often skips breakfast, light lunch. Larger supper.  -Extensive dietary counseling discussed with patient and her daughter today. Patient will plan to work on low-carbohydrate snacks  -Candidate for SGLT-2 inhibitor, will defer until A1c improves.  -Increase tresiba to 32 units daily    Anxiety / Insomnia (Goal: Maintain symptom remission) -Controlled: Not addressed during this visit. -Current treatment: Hydroxyzine 25 mg three times daily as needed: Appropriate, Effective, Safe, Accessible Sertraline 25 mg daily: Appropriate, Effective, Safe, Accessible  Trazodone 100 mg 1/2 tablet nightly: Appropriate, Effective, Safe, Accessible  -Medications previously tried/failed: NA -GAD7: 8 -Enjoys puzzles, word searches  -Recommended to continue current medication  Osteoporosis / Osteopenia (Goal Prevent fractures) -Uncontrolled: Not addressed during this visit. -Patient is a candidate for pharmacologic treatment due to T-Score < -2.5 in femoral neck -Current treatment  Calcium-Vitamin D twice daily: Appropriate, Effective, Safe, Accessible  Ergocalciferol 50,000 units weekly:  Appropriate, Effective, Safe, Accessible  -Medications previously tried: NA  -Recheck Vitamin D  -Recommended to continue current medication  GERD (Goal: Prevent reflux) -Controlled: Not addressed during this visit. -Current treatment  Omeprazole 40 mg daily  -Medications previously tried: NA  -Excellent symptom control, rebound symptoms if skipping a day.  -Recommended to continue current medication  Overactive Bladder (Goal: Minimize symptoms) -Controlled: Not addressed during this visit. -Current treatment  Oxybutynin XL 10 mg daily  -Medications previously tried: NA -Waking up 2-3 times nightly, much better controlled with oxybutynin.  -Will see if symptom changes as hyperglycemia improves.   -Recommended to continue current medication  Patient Goals/Self-Care Activities Patient will:  - check glucose daily before breakfast, document, and provide at future appointments check blood pressure 2-3 times weekly, document, and provide at future appointments weigh daily, and contact provider if weight gain of greater than 2 pounds in 24 hours  Follow Up Plan: Telephone follow up appointment with care management team member scheduled for:  11/23/2021 at 9:15 AM    Medication Assistance:  Joni Reining obtained through [name] medication assistance program.  Enrollment ends Dec 2023  Compliance/Adherence/Medication fill history: Care Gaps: Tetanus/TDAP Dexa Scan Diabetic Foot Exam A1C  > 9  Star-Rating Drugs: Atorvastatin 20 mg last filled on 04/28/2021 for a 90-Day supply with [facility] Losartan 25 mg last filled on 06/09/2021  for a 90-Day supply with [facility] Trulicity 1.5 mg last filled on 06/18/2021 for a 28-Day supply with [facility]  Patient's preferred pharmacy is:  [facility], [place] [address] Phone: [phone] Fax: [phone]  [id number] [place] [address] [address]Phone: [phone]: [phone] Uses pill box? Yes Pt endorses 100% compliance  We discussed: Current pharmacy is preferred with insurance plan and patient is satisfied with pharmacy services Patient decided to: Continue current medication management strategy  Care Plan and Follow Up Patient Decision:  Patient agrees to Care Plan and Follow-up.  Plan: Telephone follow up appointment with care management team member scheduled for:  11/23/2021 at [address] PharmD, [phone]

## 2021-10-13 NOTE — Patient Instructions (Signed)
Visit Information It was great speaking with you today!  Please let me know if you have any questions about our visit.  Plan:  Please INCREASE your [name] to 32 units once daily  Print copy of patient instructions, educational materials, and care plan provided in person.  Telephone follow up appointment with pharmacy team member scheduled for: 11/13/2021 at [place], PharmD, [address] [place], [place] [phone]

## 2021-10-15 ENCOUNTER — Ambulatory Visit: Payer: Self-pay

## 2021-10-15 DIAGNOSIS — E11621 Type 2 diabetes mellitus with foot ulcer: Secondary | ICD-10-CM

## 2021-10-15 MED ORDER — TRESIBA FLEXTOUCH 100 UNIT/ML ~~LOC~~ SOPN: 32.0000 [IU] | PEN_INJECTOR | Freq: Every day | SUBCUTANEOUS | Status: DC

## 2021-10-15 NOTE — Chronic Care Management (AMB) (Signed)
   10/15/2021  Andrea [name] 05-28-1945 [id number]   Documentation encounter created to complete case transition. The care management team will follow for care coordination as needed.    [place] Management [phone]

## 2021-10-18 ENCOUNTER — Other Ambulatory Visit: Payer: Self-pay | Admitting: Family Medicine

## 2021-10-18 DIAGNOSIS — F419 Anxiety disorder, unspecified: Secondary | ICD-10-CM

## 2021-10-19 NOTE — Telephone Encounter (Signed)
Requested Prescriptions  Pending Prescriptions Disp Refills  . hydrOXYzine (ATARAX) 25 MG tablet [Pharmacy Med Name: HYDROXYZINE HCL 25 MG TABLET] 270 tablet 0    Sig: TAKE 1 TABLET BY MOUTH THREE TIMES A DAY AS NEEDED     Ear, Nose, and Throat:  Antihistamines 2 Passed - 10/18/2021  1:35 AM      Passed - Cr in normal range and within 360 days    Creatinine  Date Value Ref Range Status  06/03/2021 1.1 0.5 - 1.1 Final   Creat  Date Value Ref Range Status  12/16/2016 1.08 (H) 0.60 - 0.93 mg/dL Final    Comment:    For patients >31 years of age, the reference limit for Creatinine is approximately 13% higher for people identified as African-American. .    Creatinine, Ser  Date Value Ref Range Status  06/02/2021 1.12 (H) 0.57 - 1.00 mg/dL Final         Passed - Valid encounter within last 12 months    Recent Outpatient Visits          1 month ago 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 [facility])   [facility] [name] T, FNP   4 months ago Type 2 diabetes mellitus with hyperlipidemia [facility])   [facility] [name] T, FNP   6 months ago Chronic heel pain, left   [facility] [name] T, FNP   7 months ago 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 edema, with long-term current use of insulin [facility])   [facility] [name], FNP   8 months ago Hospital discharge follow-up   [facility] [name], [place]      Future Appointments            In 1 month [name], [place], [place]

## 2021-11-03 ENCOUNTER — Other Ambulatory Visit: Payer: Self-pay | Admitting: Family Medicine

## 2021-11-03 DIAGNOSIS — E785 Hyperlipidemia, unspecified: Secondary | ICD-10-CM

## 2021-11-05 ENCOUNTER — Other Ambulatory Visit: Payer: Self-pay | Admitting: Family Medicine

## 2021-11-05 ENCOUNTER — Telehealth: Payer: Self-pay | Admitting: Family Medicine

## 2021-11-05 DIAGNOSIS — E1159 Type 2 diabetes mellitus with other circulatory complications: Secondary | ICD-10-CM

## 2021-11-05 DIAGNOSIS — Z7985 Long-term (current) use of injectable non-insulin antidiabetic drugs: Secondary | ICD-10-CM | POA: Diagnosis not present

## 2021-11-05 DIAGNOSIS — I5020 Unspecified systolic (congestive) heart failure: Secondary | ICD-10-CM | POA: Diagnosis not present

## 2021-11-05 DIAGNOSIS — E1169 Type 2 diabetes mellitus with other specified complication: Secondary | ICD-10-CM

## 2021-11-05 DIAGNOSIS — E785 Hyperlipidemia, unspecified: Secondary | ICD-10-CM | POA: Diagnosis not present

## 2021-11-05 DIAGNOSIS — E1142 Type 2 diabetes mellitus with diabetic polyneuropathy: Secondary | ICD-10-CM

## 2021-11-05 DIAGNOSIS — I110 Hypertensive heart disease with heart failure: Secondary | ICD-10-CM

## 2021-11-05 DIAGNOSIS — M810 Age-related osteoporosis without current pathological fracture: Secondary | ICD-10-CM

## 2021-11-05 MED ORDER — ONETOUCH DELICA LANCETS 33G MISC: 5 refills | Status: DC

## 2021-11-05 NOTE — Telephone Encounter (Signed)
Medication Refill - Medication:Lancets one touch  Has the patient contacted their pharmacy? Yes.   (Agent: If no, request that the patient contact the pharmacy for the refill. If patient does not wish to contact the pharmacy document the reason why and proceed with request.) (Agent: If yes, when and what did the pharmacy advise?)  Preferred Pharmacy (with phone number or street name): [facility] Has the patient been seen for an appointment in the last year OR does the patient have an upcoming appointment? Yes.    Agent: Please be advised that RX refills may take up to 3 business days. We ask that you follow-up with your pharmacy.

## 2021-11-05 NOTE — Telephone Encounter (Signed)
[facility] faxed refill request for the following medications:  OneTouch Delica Lancets 46O MISC   Please advise.

## 2021-11-06 NOTE — Telephone Encounter (Signed)
Requested medication (s) are due for refill today:no  Requested medication (s) are on the active medication list: yes  Last refill:  11/05/21  Future visit scheduled: yes / Notes to clinic:  Unable to refill per protocol, last refill by provider on 0/38/33, possible duplicate request.     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)   [facility] [name] T, FNP   7 months ago Chronic heel pain, left   [facility] [name] T, FNP   8 months ago 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 edema, with long-term current use of insulin [facility])   [facility] [name], FNP   9 months ago Hospital discharge follow-up   [facility] [name], [place]       Future Appointments             In 1 month [name], [place], [place]

## 2021-11-13 ENCOUNTER — Ambulatory Visit (INDEPENDENT_AMBULATORY_CARE_PROVIDER_SITE_OTHER): Payer: Medicare Other

## 2021-11-13 DIAGNOSIS — K219 Gastro-esophageal reflux disease without esophagitis: Secondary | ICD-10-CM

## 2021-11-13 DIAGNOSIS — F419 Anxiety disorder, unspecified: Secondary | ICD-10-CM | POA: Insufficient documentation

## 2021-11-13 DIAGNOSIS — I5022 Chronic systolic (congestive) heart failure: Secondary | ICD-10-CM

## 2021-11-13 DIAGNOSIS — E559 Vitamin D deficiency, unspecified: Secondary | ICD-10-CM | POA: Insufficient documentation

## 2021-11-13 DIAGNOSIS — E785 Hyperlipidemia, unspecified: Secondary | ICD-10-CM

## 2021-11-13 DIAGNOSIS — N3281 Overactive bladder: Secondary | ICD-10-CM | POA: Insufficient documentation

## 2021-11-13 MED ORDER — OMEPRAZOLE 40 MG PO CPDR: 40.0000 mg | DELAYED_RELEASE_CAPSULE | Freq: Every day | ORAL | 1 refills | Status: DC

## 2021-11-13 MED ORDER — METOPROLOL SUCCINATE ER 50 MG PO TB24: 50.0000 mg | ORAL_TABLET | Freq: Every day | ORAL | 1 refills | Status: DC

## 2021-11-13 MED ORDER — VITAMIN D (ERGOCALCIFEROL) 1.25 MG (50000 UNIT) PO CAPS: 50000.0000 [IU] | ORAL_CAPSULE | ORAL | 1 refills | Status: DC

## 2021-11-13 MED ORDER — FUROSEMIDE 20 MG PO TABS: 20.0000 mg | ORAL_TABLET | Freq: Every day | ORAL | 1 refills | Status: DC

## 2021-11-13 MED ORDER — LOSARTAN POTASSIUM 25 MG PO TABS: 25.0000 mg | ORAL_TABLET | Freq: Every day | ORAL | 1 refills | Status: DC

## 2021-11-13 MED ORDER — ATORVASTATIN CALCIUM 20 MG PO TABS: 20.0000 mg | ORAL_TABLET | Freq: Every day | ORAL | 1 refills | Status: DC

## 2021-11-13 MED ORDER — SERTRALINE HCL 25 MG PO TABS: 25.0000 mg | ORAL_TABLET | Freq: Every day | ORAL | 1 refills | Status: DC

## 2021-11-13 MED ORDER — OXYBUTYNIN CHLORIDE ER 10 MG PO TB24: 10.0000 mg | ORAL_TABLET | Freq: Every day | ORAL | 1 refills | Status: DC

## 2021-11-13 NOTE — Patient Instructions (Signed)
Visit Information It was great speaking with you today!  Please let me know if you have any questions about our visit.  Patient Care Plan: General Pharmacy (Adult)     Problem Identified: Hypertension, Hyperlipidemia, Diabetes, Heart Failure, Coronary Artery Disease, GERD, COPD, Anxiety, Osteoporosis, and Insomnia, History of TIA   Priority: High     Long-Range Goal: Patient-Specific Goal   Start Date: 06/23/2021  Expected End Date: 07/07/2022  This Visit's Progress: On track  Recent Progress: On track  Priority: High  Note:   Current Barriers:  Unable to independently afford treatment regimen Unable to achieve control of diabetes   Pharmacist Clinical Goal(s):  Patient will verbalize ability to afford treatment regimen achieve control of diabetes as evidenced by A1c less than 8% through collaboration with PharmD and provider.   Interventions: 1:1 collaboration with [name], FNP regarding development and update of comprehensive plan of care as evidenced by provider attestation and co-signature Inter-disciplinary care team collaboration (see longitudinal plan of care) Comprehensive medication review performed; medication list updated in electronic medical record  Heart Failure (Goal: manage symptoms and prevent exacerbations) -Controlled: Not addressed during this visit. -Last ejection fraction: 40-45% (Date: Jan 2023) -HF type: Systolic -NYHA Class: III (marked limitation of activity) -Current treatment: Furosemide 20 mg daily: Appropriate, Effective, Safe, Accessible Losartan 25 mg daily: Appropriate, Effective, Safe, Accessible  Metoprolol XL 25 mg twice daily: Appropriate, Effective, Safe, Accessible  -Medications previously tried: NA  -Only taking metoprolol 25 mg daily due to not understanding instructions.  -Current home BP/HR readings: Not monitoring -Will change metoprolol to 50 mg daily for simplicity and improved adherence  Hyperlipidemia: (LDL goal <  70) -Controlled -History of PAD, CAD, TIA -Current treatment: Atorvastatin 20 mg daily: Appropriate, Effective, Safe, Accessible  -Medications previously tried: NA  -Recommended to continue current medication  Diabetes (A1c goal <8%) -Uncontrolled -Current medications: Tresiba 32 units daily (0.38 u/kg): Appropriate, Query effective  Ozempic 0.5 mg weekly (wednesdays): Appropriate, Query effective  -Medications previously tried: Jardiance  -Current home glucose readings fasting glucose: 172, typically 140-150s.  -Reports hyperglycemic symptoms: polydipsia  -Current dietary Patterns:Increased vegetable intake. Continues to indulge in late night snacks of Peanut butter crackers or chips -Continue current medications   Anxiety / Insomnia (Goal: Maintain symptom remission) -Controlled: Not addressed during this visit. -Current treatment: Hydroxyzine 25 mg three times daily as needed: Appropriate, Effective, Safe, Accessible Sertraline 25 mg daily: Appropriate, Effective, Safe, Accessible  -Medications previously tried/failed: NA -GAD7: 8 -Enjoys puzzles, word searches  -Takes hydroxyzine daily with breakfast  -Patient has not taken trazodone in months, reports adequate sleep quality.  -STOP Trazodone.   Osteoporosis / Osteopenia (Goal Prevent fractures) -Uncontrolled: Not addressed during this visit. -Patient is a candidate for pharmacologic treatment due to T-Score < -2.5 in femoral neck -Current treatment  Calcium-Vitamin D twice daily: Appropriate, Effective, Safe, Accessible  Ergocalciferol 50,000 units weekly: Appropriate, Effective, Safe, Accessible  -Medications previously tried: NA  -Recheck Vitamin D  -Recommended to continue current medication  GERD (Goal: Prevent reflux) -Controlled: Not addressed during this visit. -Current treatment  Omeprazole 40 mg daily  -Medications previously tried: NA  -Excellent symptom control, rebound symptoms if skipping a day.   -Recommended to continue current medication  Overactive Bladder (Goal: Minimize symptoms) -Controlled: Not addressed during this visit. -Current treatment  Oxybutynin XL 10 mg daily  -Medications previously tried: NA -Waking up 2-3 times nightly, much better controlled with oxybutynin.  -Will see if symptom changes as hyperglycemia improves.   -  Recommended to continue current medication  Patient Goals/Self-Care Activities Patient will:  - check glucose daily before breakfast, document, and provide at future appointments check blood pressure 2-3 times weekly, document, and provide at future appointments weigh daily, and contact provider if weight gain of greater than 2 pounds in 24 hours  Follow Up Plan: Telephone follow up appointment with care management team member scheduled for:  12/28/2021 at 11:00 AM    Patient agreed to services and verbal consent obtained.   Patient verbalizes understanding of instructions and care plan provided today and agrees to view in [facility]. Active MyChart status and patient understanding of how to access instructions and care plan via MyChart confirmed with patient.     [name], PharmD, Para [place], CPP  Clinical Pharmacist Practitioner  [facility] [phone]

## 2021-11-13 NOTE — Progress Notes (Signed)
Chronic Care Management Pharmacy Note  11/13/2021 Name:  Andrea [name] MRN:  [id number] DOB:  10/08/1945  Summary: Patient presents for CCM follow-up. Patient presents in office alongside [place], patient's daughter.   -Patient has not taken trazodone in months, reports adequate sleep quality.   -Patient onboarded to [facility]  Recommendations/Changes made from today's visit:  -Will change metoprolol to 50 mg daily for simplicity and improved adherence  -STOP Trazodone.   -Recommend at PCP follow-up:  -Recheck Vitamin B12, Vitamin D  -Increase Ozempic to 1 mg if A1c still elevated.   Plan: CPP follow-up 1 month  Subjective: Andrea [name] is an 76 y.o. year old female who is a primary patient of [name], FNP.  The CCM team was consulted for assistance with disease management and care coordination needs.    Engaged with patient by telephone for follow up visit in response to provider referral for pharmacy case management and/or care coordination services.   Consent to Services:  The patient was given information about Chronic Care Management services, agreed to services, and gave verbal consent prior to initiation of services.  Please see initial visit note for detailed documentation.   Patient Care Team: [name], FNP as PCP - General (Family Medicine) Minna Merritts, MD as PCP - Cardiology (Cardiology) Estill Cotta, MD as Consulting Physician (Ophthalmology) Newman Pies, MD as Consulting Physician (Neurosurgery) Lucky Cowboy Erskine Squibb, MD as Referring Physician (Vascular Surgery) [name], MD as Consulting Physician (Gastroenterology) Germaine Pomfret, [facility] (Pharmacist)  Recent office visits: 09/03/21: Patient presented to [name], FNP for follow-up. A1c 10.4%.   06/02/2021 [name], FNP (PCP Office Visit) for Type II DM- Stopped: Amoxicillin due to patient not taking, Started: Clobetasol Propionate 7.67% 1 application daily, Lab  orders placed, Referral for Dermatology placed, Patient to follow-up in 4 months   Recent consult visits: 05/26/2021 Daylene Katayama, DPM (Podiatry) for Diabetic Ulcer- No medication changes noted, No orders placed, Patient to follow-up as needed   04/28/2021 Ida Rogue, MD (Cardiology) for 1 year follow-up- Changed: Metoprolol Succinate 25 mg 1 tablet daily to twice daily, EKG 12-Lead ordered, Patient to follow-up in 1 year   04/21/2021 Daylene Katayama, DPM (Podiatry) for Diabetic Ulcer- No medication changes noted, DG Foot Complete Left ordered, Patient to follow-up in 4 weeks   03/24/2021 Daylene Katayama, DPM (Podiatry) for Wound Check-No medication changes noted, DG Foot Complete Left ordered, Patient to follow-up in 4 weeks   Hospital visits: None in previous 6 months   Objective:  Lab Results  Component Value Date   CREATININE 1.1 06/03/2021   BUN 30 (H) 06/02/2021   EGFR 51 06/03/2021   GFRNONAA 46 (L) 12/22/2020   GFRAA 70 02/11/2020   NA 138 06/03/2021   K 5.0 06/03/2021   CALCIUM 9.4 06/03/2021   CO2 22 06/02/2021   GLUCOSE 243 (H) 06/02/2021    Lab Results  Component Value Date/Time   HGBA1C 10.4 (A) 09/03/2021 02:21 PM   HGBA1C 11.3 06/03/2021 04:44 PM   HGBA1C 11.3 (A) 06/02/2021 01:59 PM   HGBA1C 11.3 (H) 06/02/2021 12:00 AM   HGBA1C 7.5 (H) 11/17/2020 10:34 PM   HGBA1C 7.5 (H) 11/26/2013 04:14 AM   MICROALBUR 20 12/16/2016 03:10 PM    Last diabetic Eye exam:  Lab Results  Component Value Date/Time   HMDIABEYEEXA No Retinopathy 04/08/2021 12:00 AM    Last diabetic Foot exam: No results found for: "HMDIABFOOTEX"   Lab Results  Component Value  Date   CHOL 136 06/03/2021   HDL 66 06/03/2021   LDLCALC 41 06/03/2021   TRIG 178 (A) 06/03/2021   CHOLHDL 2.1 06/02/2021       Latest Ref Rng & Units 06/03/2021    4:44 PM 06/02/2021   12:00 AM 12/18/2020   10:22 AM  Hepatic Function  Total Protein 6.0 - 8.5 g/dL  6.7  6.3   Albumin 3.5 - 5.0 4.2     4.2  2.6    AST 13 - 35 '15     15  17   ' ALT 7 - 35 U/L '10     10  16   ' Alk Phosphatase 44 - 121 IU/L  144  130   Total Bilirubin 0.0 - 1.2 mg/dL  0.6  0.8      This result is from an external source.    Lab Results  Component Value Date/Time   TSH 1.88 06/03/2021 04:44 PM   TSH 1.880 06/02/2021 12:00 AM   TSH 1.220 12/28/2018 04:26 PM   FREET4 1.33 06/02/2021 12:00 AM       Latest Ref Rng & Units 12/20/2020    4:49 AM 12/19/2020    5:45 AM 12/18/2020   10:22 AM  CBC  WBC 4.0 - 10.5 K/uL 7.6  7.0  9.5   Hemoglobin 12.0 - 15.0 g/dL 10.5  10.4  11.4   Hematocrit 36.0 - 46.0 % 32.7  31.8  35.1   Platelets 150 - 400 K/uL 211  189  188     No results found for: "VD25OH"  Clinical ASCVD: Yes  The ASCVD Risk score (Arnett DK, et al., 2019) failed to calculate for the following reasons:   The patient has a prior MI or stroke diagnosis       06/30/2021    1:04 PM 11/17/2020    4:03 PM 06/05/2020   11:20 AM  Depression screen PHQ 2/9  Decreased Interest 0 0 0  Down, Depressed, Hopeless 0 0 0  PHQ - 2 Score 0 0 0  Altered sleeping   0  Tired, decreased energy   0  Change in appetite   0  Feeling bad or failure about yourself    0  Trouble concentrating   0  Moving slowly or fidgety/restless   0  Suicidal thoughts   0  PHQ-9 Score   0  Difficult doing work/chores   Not difficult at all    -Last DEXA Scan: 03/07/18   T-Score femoral neck: -3.0  T-Score total hip: NA  T-Score lumbar spine: -0.4  T-Score forearm radius: -4.0  Social History   Tobacco Use  Smoking Status Former   Packs/day: 1.00   Years: 30.00   Total pack years: 30.00   Types: Cigarettes   Quit date: 07/17/2012   Years since quitting: 9.3  Smokeless Tobacco Never  Tobacco Comments       BP Readings from Last 3 Encounters:  09/03/21 101/63  06/02/21 115/71  04/28/21 (!) 120/50   Pulse Readings from Last 3 Encounters:  09/03/21 90  06/02/21 91  04/28/21 97   Wt Readings from Last 3 Encounters:   09/03/21 143 lb 8 oz (65.1 kg)  06/30/21 137 lb (62.1 kg)  06/02/21 137 lb 11.2 oz (62.5 kg)   BMI Readings from Last 3 Encounters:  09/03/21 28.03 kg/m  06/30/21 26.76 kg/m  06/02/21 26.89 kg/m    Assessment/Interventions: Review of patient past medical history, allergies, medications, health status, including review  of consultants reports, laboratory and other test data, was performed as part of comprehensive evaluation and provision of chronic care management services.   SDOH:  (Social Determinants of Health) assessments and interventions performed: Yes SDOH Interventions    Flowsheet Row Chronic Care Management from 06/23/2021 in [facility] Most recent reading at 07/06/2021 10:27 AM Clinical Support from 06/30/2021 in [facility] Most recent reading at 06/30/2021  1:05 PM Chronic Care Management from 11/17/2020 in [facility] Most recent reading at 11/17/2020  3:05 PM Office Visit from 12/26/2019 in [facility] Most recent reading at 12/26/2019 11:13 AM  SDOH Interventions      Food Insecurity Interventions -- Intervention Not Indicated Intervention Not Indicated --  Housing Interventions -- Intervention Not Indicated -- --  Transportation Interventions Intervention Not Indicated Intervention Not Indicated [id number] Referral --  Depression Interventions/Treatment  -- -- -- Medication  Financial Strain Interventions Other (Comment)  [PAP] Intervention Not Indicated -- --  Physical Activity Interventions -- Intervention Not Indicated -- --  Stress Interventions -- Intervention Not Indicated -- --  Social Connections Interventions -- Intervention Not Indicated -- --       SDOH Screenings   Food Insecurity: No Food Insecurity (06/30/2021)  Housing: Low Risk  (06/30/2021)  Transportation Needs: No Transportation Needs (07/06/2021)  Alcohol Screen: Low Risk  (06/30/2021)  Depression (PHQ2-9): Low Risk  (06/30/2021)  Financial  Resource Strain: High Risk (07/06/2021)  Physical Activity: Sufficiently Active (06/30/2021)  Social Connections: Moderately Isolated (06/30/2021)  Stress: No Stress Concern Present (06/30/2021)  Tobacco Use: Medium Risk (09/03/2021)    CCM Care Plan  Allergies  Allergen Reactions   Chlorhexidine    Metformin And Related Diarrhea   Sulfa Antibiotics Rash    Medications Reviewed Today     Reviewed by Germaine Pomfret, [facility] (Pharmacist) on 11/13/21 at 1011  Med List Status: <None>   Medication Order Taking? Sig Documenting Provider Last Dose Status Informant  Alcohol Swabs (ALCOHOL PADS) 70 % PADS [id number]  1 each by Does not apply route in the morning and at bedtime. [name] T, FNP  Active   atorvastatin (LIPITOR) 20 MG tablet [id number] Yes TAKE 1 TABLET BY MOUTH EVERYDAY AT BEDTIME [name], FNP Taking Active   Calcium Carbonate-Vitamin D 600-400 MG-UNIT tablet [id number] Yes Take 1 tablet by mouth daily. [provider] Taking Active Nursing Home Medication Administration Guide (MAG)  clobetasol (TEMOVATE) 0.05 % external solution [id number]  Apply 1 application. topically 2 (two) times daily. [name], FNP  Active   Cyanocobalamin (B-12) 1000 MCG CAPS [id number]  Take 1,000 mcg by mouth daily. [provider]  Active Nursing Home Medication Administration Guide (MAG)  furosemide (LASIX) 20 MG tablet [id number] Yes TAKE 1 TABLET BY MOUTH EVERY DAY [name], FNP Taking Active   hydrOXYzine (ATARAX) 25 MG tablet [id number] Yes TAKE 1 TABLET BY MOUTH THREE TIMES A DAY AS NEEDED [name], FNP Taking Active   insulin degludec (TRESIBA FLEXTOUCH) 100 UNIT/ML FlexTouch Pen [id number]  Inject 32 Units into the skin at bedtime. Patient receives via [name] Patient Assistance through December 2023 [name] T, FNP  Active   Insulin Pen Needle (TRUEPLUS PEN NEEDLES) 31G X 6 MM MISC [id number]  Use with lantus two times daily. Virginia Crews, MD   Active Nursing Home Medication Administration Guide (MAG)  INSULIN SYRINGE 1CC/29G 29G X 1/2" 1 ML MISC [id number]  For lantus injections twice daily [name]  Curt Jews  Active Nursing Home Medication Administration Guide (MAG)  losartan (COZAAR) 25 MG tablet [id number] Yes TAKE 1 TABLET BY MOUTH EVERY DAY [name], FNP Taking Active   metoprolol succinate (TOPROL-XL) 25 MG 24 hr tablet [id number] Yes Take 1 tablet (25 mg total) by mouth 2 (two) times daily. Minna Merritts, MD Taking Active   Multiple Vitamins-Minerals (PRESERVISION AREDS 2 PO) [id number] Yes Take 1 tablet by mouth in the morning and at bedtime. [provider] Taking Active Nursing Home Medication Administration Guide (MAG)  Omega-3 1000 MG CAPS [id number] Yes Take 1,000 mg by mouth daily. [provider] Taking Active Nursing Home Medication Administration Guide (MAG)  omeprazole (PRILOSEC) 40 MG capsule [id number] Yes TAKE 1 CAPSULE BY MOUTH EVERY DAY [name], FNP Taking Active   OneTouch Delica Lancets 01B MISC [id number]  To check blood sugar daily  DX: E11.9 [name], FNP  Active   [facility] VERIO test strip [id number]  TO CHECK BLOOD SUGAR ONCE DAILY. [name] T, FNP  Active   oxybutynin (DITROPAN-XL) 10 MG 24 hr tablet [id number] Yes TAKE 1 TABLET BY MOUTH EVERYDAY AT BEDTIME [name], FNP Taking Active   oxyCODONE (OXY IR/ROXICODONE) 5 MG immediate release tablet [id number]  Take 1 tablet (5 mg total) by mouth every 4 (four) hours as needed for severe pain. [name], FNP  Active   Semaglutide,0.25 or 0.5MG/DOS, (OZEMPIC, 0.25 OR 0.5 MG/DOSE,) 2 MG/3ML SOPN [id number]  Inject 0.5 mg into the skin once a week. Patient receives via [name] Patient Assistance through December 2023 [name] T, FNP  Active            Med Note Michaelle Birks, Cathe Mons A   Fri Nov 13, 2021  9:36 AM)    sertraline (ZOLOFT) 25 MG tablet [id number] Yes TAKE 1 TABLET (25 MG TOTAL) BY MOUTH DAILY. [name], FNP Taking Active   traZODone (DESYREL) 100 MG tablet [id number] No TAKE 1 TABLET BY MOUTH EVERYDAY AT BEDTIME  Patient not taking: Reported on 11/13/2021   [name], FNP Not Taking Active   Vitamin D, Ergocalciferol, (DRISDOL) 1.25 MG (50000 UNIT) CAPS capsule [id number] Yes TAKE ONE CAPSULE BY MOUTH ONCE WEEKLY ON THE SAME DAY [place] (ON FRIDAYS) [name], FNP Taking Active   Med List Note Loann Quill, CPhT 12/18/20 1047): [employer] [phone]            Patient Active Problem List   Diagnosis Date Noted   Gastroesophageal reflux disease without esophagitis 11/13/2021   OAB (overactive bladder) 11/13/2021   Anxiety 11/13/2021   Vitamin D deficiency 11/13/2021   Dependent on wheelchair 09/03/2021   Impaired mobility 09/03/2021   Hypertension associated with diabetes ([place]) 09/03/2021   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 ([place]) 09/03/2021   Need for pneumococcal vaccination 06/02/2021   Hair loss 06/02/2021   Hyperlipidemia LDL goal <70 04/28/2021   Chronic heel pain, left 04/02/2021   MRSA (methicillin resistant staph aureus) culture positive 02/05/2021   Hospital discharge follow-up 01/23/2021   Weight loss, unintentional 01/23/2021   Primary insomnia 01/23/2021   Loss of appetite for more than 2 weeks 01/23/2021   Hypomagnesemia    Hypokalemia    Essential hypertension    Tachycardia    Depression    Acute metabolic encephalopathy 26/83/4196   Acute kidney injury superimposed on CKD ([place]) 12/18/2020   Delirium 22/29/7989   Chronic systolic CHF (  congestive heart failure) ([place]) 12/18/2020   Osteomyelitis of foot, left, acute ([place]) 12/18/2020   Status post peripherally inserted central catheter (PICC) central line placement    Osteomyelitis ([place]) 11/17/2020   Cellulitis of left lower extremity 11/13/2020   Hypoglycemia 11/13/2020   Non healing left heel wound 11/13/2020   Status post total hip replacement, left  08/19/2020   Pain due to onychomycosis of toenail of left foot 03/06/2020   Malaise and fatigue 12/28/2019   UTI (urinary tract infection) 12/28/2019   Hyponatremia 12/27/2019   Intertrigo 12/27/2019   History of meningitis 11/23/2019   CSF leak from nose 11/22/2019   History of myocardial infarction 03/09/2019   PAD (peripheral artery disease) ([place]) 12/28/2018   Leg length discrepancy 09/21/2017   Coronary artery disease involving native coronary artery 01/06/2017   Drug-induced constipation 01/06/2017   Spinal stenosis of lumbar region 01/06/2017   Spondylolisthesis of lumbar region 12/30/2016   Obese 04/16/2015   History of colon polyps 11/23/2012   Bundle branch block, 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   Personal history of transient ischemic attack (TIA), and cerebral infarction without residual deficits 11/23/2012   Encounter for long-term (current) use of aspirin 11/23/2012   Status post percutaneous transluminal coronary angioplasty 11/23/2012   Transient ischemic attack (TIA), and cerebral infarction without residual deficits(V12.54) 11/23/2012   Anemia 08/17/2012   Chronic obstructive pulmonary disease ([place]) 08/17/2012   HLD (hyperlipidemia) 08/17/2012   OP (osteoporosis) 08/17/2012   Type 2 diabetes mellitus with hyperlipidemia ([place]) 08/17/2012   Ischemic heart disease due to coronary artery obstruction ([place]) 07/24/2012    Immunization History  Administered Date(s) Administered   Fluad Quad(high Dose 65+) 12/01/2020   Influenza, High Dose Seasonal PF 02/12/2016, 12/16/2016, 12/19/2017   Influenza,inj,Quad PF,6+ Mos 12/09/2019   PFIZER(Purple Top)SARS-COV-2 Vaccination 07/14/2019, 08/14/2019   Pfizer Covid-19 Vaccine Bivalent Booster 5y-11y 01/27/2021   Pneumococcal Polysaccharide-23 06/02/2021   Zoster Recombinat (Shingrix) 05/07/2018, 11/21/2018    Conditions to be addressed/monitored:  Hypertension, Hyperlipidemia, Diabetes, Heart Failure, Coronary Artery Disease, GERD, COPD,  Anxiety, Osteoporosis, and Insomnia, History of TIA  Care Plan : General Pharmacy (Adult)  Updates made by Germaine Pomfret, RPH since 11/13/2021 12:00 AM     Problem: Hypertension, Hyperlipidemia, Diabetes, Heart Failure, Coronary Artery Disease, GERD, COPD, Anxiety, Osteoporosis, and Insomnia, History of TIA   Priority: High     Long-Range Goal: Patient-Specific Goal   Start Date: 06/23/2021  Expected End Date: 07/07/2022  This Visit's Progress: On track  Recent Progress: On track  Priority: High  Note:   Current Barriers:  Unable to independently afford treatment regimen Unable to achieve control of diabetes   Pharmacist Clinical Goal(s):  Patient will verbalize ability to afford treatment regimen achieve control of diabetes as evidenced by A1c less than 8% through collaboration with PharmD and provider.   Interventions: 1:1 collaboration with [name], FNP regarding development and update of comprehensive plan of care as evidenced by provider attestation and co-signature Inter-disciplinary care team collaboration (see longitudinal plan of care) Comprehensive medication review performed; medication list updated in electronic medical record  Heart Failure (Goal: manage symptoms and prevent exacerbations) -Controlled: Not addressed during this visit. -Last ejection fraction: 40-45% (Date: Jan 2023) -HF type: Systolic -NYHA Class: III (marked limitation of activity) -Current treatment: Furosemide 20 mg daily: Appropriate, Effective, Safe, Accessible Losartan 25 mg daily: Appropriate, Effective, Safe, Accessible  Metoprolol XL 25 mg twice daily: Appropriate, Effective, Safe, Accessible  -Medications previously tried: NA  -Only taking metoprolol 25 mg  daily due to not understanding instructions.  -Current home BP/HR readings: Not monitoring -Will change metoprolol to 50 mg daily for simplicity and improved adherence  Hyperlipidemia: (LDL goal < 70) -Controlled -History  of PAD, CAD, TIA -Current treatment: Atorvastatin 20 mg daily: Appropriate, Effective, Safe, Accessible  -Medications previously tried: NA  -Recommended to continue current medication  Diabetes (A1c goal <8%) -Uncontrolled -Current medications: Tresiba 32 units daily (0.38 u/kg): Appropriate, Query effective  Ozempic 0.5 mg weekly (wednesdays): Appropriate, Query effective  -Medications previously tried: Jardiance  -Current home glucose readings fasting glucose: 172, typically 140-150s.  -Reports hyperglycemic symptoms: polydipsia  -Current dietary Patterns:Increased vegetable intake. Continues to indulge in late night snacks of Peanut butter crackers or chips -Continue current medications   Anxiety / Insomnia (Goal: Maintain symptom remission) -Controlled: Not addressed during this visit. -Current treatment: Hydroxyzine 25 mg three times daily as needed: Appropriate, Effective, Safe, Accessible Sertraline 25 mg daily: Appropriate, Effective, Safe, Accessible  -Medications previously tried/failed: NA -GAD7: 8 -Enjoys puzzles, word searches  -Takes hydroxyzine daily with breakfast  -Patient has not taken trazodone in months, reports adequate sleep quality.  -STOP Trazodone.   Osteoporosis / Osteopenia (Goal Prevent fractures) -Uncontrolled: Not addressed during this visit. -Patient is a candidate for pharmacologic treatment due to T-Score < -2.5 in femoral neck -Current treatment  Calcium-Vitamin D twice daily: Appropriate, Effective, Safe, Accessible  Ergocalciferol 50,000 units weekly: Appropriate, Effective, Safe, Accessible  -Medications previously tried: NA  -Recheck Vitamin D  -Recommended to continue current medication  GERD (Goal: Prevent reflux) -Controlled: Not addressed during this visit. -Current treatment  Omeprazole 40 mg daily  -Medications previously tried: NA  -Excellent symptom control, rebound symptoms if skipping a day.  -Recommended to continue  current medication  Overactive Bladder (Goal: Minimize symptoms) -Controlled: Not addressed during this visit. -Current treatment  Oxybutynin XL 10 mg daily  -Medications previously tried: NA -Waking up 2-3 times nightly, much better controlled with oxybutynin.  -Will see if symptom changes as hyperglycemia improves.   -Recommended to continue current medication  Patient Goals/Self-Care Activities Patient will:  - check glucose daily before breakfast, document, and provide at future appointments check blood pressure 2-3 times weekly, document, and provide at future appointments weigh daily, and contact provider if weight gain of greater than 2 pounds in 24 hours  Follow Up Plan: Telephone follow up appointment with care management team member scheduled for:  12/28/2021 at 11:00 AM     Medication Assistance:  Joni Reining obtained through [name] medication assistance program.  Enrollment ends Dec 2023  Compliance/Adherence/Medication fill history: Care Gaps: Tetanus/TDAP Dexa Scan Diabetic Foot Exam A1C  > 9  Star-Rating Drugs: Atorvastatin 20 mg last filled on 04/28/2021 for a 90-Day supply with [facility] Losartan 25 mg last filled on 06/09/2021 for a 90-Day supply with [facility] Trulicity 1.5 mg last filled on 06/18/2021 for a 28-Day supply with [facility]  Patient's preferred pharmacy is:  [place], [place] - [address] [address] Phone: [phone] Fax: [phone]  [facility] - [place], [place] - [address] [place] [address] Phone: [phone] Fax: [phone]  Uses pill box? Yes Pt endorses 100% compliance  We discussed: Verbal consent obtained for [facility] enhanced pharmacy services (medication synchronization, adherence packaging, delivery coordination). A medication sync plan was created to allow patient  to get all medications delivered once every 30 to  90 days per patient preference. Patient understands they have freedom to choose pharmacy and clinical pharmacist will coordinate care between all prescribers and [facility].  Patient decided to: Utilize [facility] for medication synchronization, packaging and delivery  Care Plan and Follow Up Patient Decision:  Patient agrees to Care Plan and Follow-up.  Plan: Telephone follow up appointment with care management team member scheduled for:  12/28/2021 at 11:00 AM  [name], PharmD, [place], [place] [phone]

## 2021-12-03 ENCOUNTER — Ambulatory Visit: Payer: 59 | Admitting: Dermatology

## 2021-12-05 DIAGNOSIS — E785 Hyperlipidemia, unspecified: Secondary | ICD-10-CM | POA: Diagnosis not present

## 2021-12-05 DIAGNOSIS — I5020 Unspecified systolic (congestive) heart failure: Secondary | ICD-10-CM

## 2021-12-05 DIAGNOSIS — Z7985 Long-term (current) use of injectable non-insulin antidiabetic drugs: Secondary | ICD-10-CM

## 2021-12-05 DIAGNOSIS — E1159 Type 2 diabetes mellitus with other circulatory complications: Secondary | ICD-10-CM | POA: Diagnosis not present

## 2021-12-05 DIAGNOSIS — M810 Age-related osteoporosis without current pathological fracture: Secondary | ICD-10-CM | POA: Diagnosis not present

## 2021-12-08 ENCOUNTER — Ambulatory Visit: Payer: Medicare Other | Admitting: Family Medicine

## 2021-12-08 NOTE — Progress Notes (Unsigned)
Established patient visit   Patient: Andrea [name]   DOB: 01-18-1946   76 y.o. Female  MRN: [id number] Visit Date: 12/10/2021  Today's healthcare provider: [name], FNP  Re Introduced to nurse practitioner role and practice setting.  All questions answered.  Discussed provider/patient relationship and expectations.   I,Oluwadamilola Deliz J Ahlana Slaydon,acting as a scribe for [name], FNP.,have documented all relevant documentation on the behalf of [name], FNP,as directed by  [name], FNP while in the presence of [name], FNP.   Chief Complaint  Patient presents with   Diabetes   Hypertension   Subjective    HPI  Diabetes Mellitus Type II, follow-up  Lab Results  Component Value Date   HGBA1C 10.4 (A) 09/03/2021   HGBA1C 11.3 06/03/2021   HGBA1C 11.3 (A) 06/02/2021   Last seen for diabetes 3 months ago.  Management since then includes continuing the same treatment. She reports excellent compliance with treatment. She is not having side effects.   Home blood sugar records: fasting range: around 110  Episodes of hypoglycemia? No    Current insulin regiment: 32 units at bedtime Most Recent Eye Exam: is due  --------------------------------------------------------------------------------------------------- Hypertension, follow-up  BP Readings from Last 3 Encounters:  12/10/21 (!) 127/56  09/03/21 101/63  06/02/21 115/71   Wt Readings from Last 3 Encounters:  12/10/21 141 lb (64 kg)  09/03/21 143 lb 8 oz (65.1 kg)  06/30/21 137 lb (62.1 kg)     She was last seen for hypertension 3 months ago.  BP at that visit was 101/63. Management since that visit includes continue lasix 20 mg, losartan 25 mg, metop xl 25 mg. She reports excellent compliance with treatment. She is not having side effects.  She is not exercising. She is adherent to low salt diet.   Outside blood pressures are not checked.  She does smoke.  Use of agents associated  with hypertension: none.   ---------------------------------------------------------------------------------------------------   Medications: Outpatient Medications Prior to Visit  Medication Sig   Alcohol Swabs (ALCOHOL PADS) 70 % PADS 1 each by Does not apply route in the morning and at bedtime.   atorvastatin (LIPITOR) 20 MG tablet Take 1 tablet (20 mg total) by mouth at bedtime.   Calcium Carbonate-Vitamin D 600-400 MG-UNIT tablet Take 1 tablet by mouth daily.   clobetasol (TEMOVATE) 0.05 % external solution Apply 1 application. topically 2 (two) times daily.   Cyanocobalamin (B-12) 1000 MCG CAPS Take 1,000 mcg by mouth daily.   furosemide (LASIX) 20 MG tablet Take 1 tablet (20 mg total) by mouth daily.   hydrOXYzine (ATARAX) 25 MG tablet TAKE 1 TABLET BY MOUTH THREE TIMES A DAY AS NEEDED   insulin degludec (TRESIBA FLEXTOUCH) 100 UNIT/ML FlexTouch Pen Inject 32 Units into the skin at bedtime. Patient receives via [name] Patient Assistance through December 2023   Insulin Pen Needle (TRUEPLUS PEN NEEDLES) 31G X 6 MM MISC Use with lantus two times daily.   INSULIN SYRINGE 1CC/29G 29G X 1/2" 1 ML MISC For lantus injections twice daily   losartan (COZAAR) 25 MG tablet Take 1 tablet (25 mg total) by mouth daily.   metoprolol succinate (TOPROL-XL) 50 MG 24 hr tablet Take 1 tablet (50 mg total) by mouth daily.   Multiple Vitamins-Minerals (PRESERVISION AREDS 2 PO) Take 1 tablet by mouth in the morning and at bedtime.   Omega-3 1000 MG CAPS Take 1,000 mg by mouth daily.  omeprazole (PRILOSEC) 40 MG capsule Take 1 capsule (40 mg total) by mouth at bedtime.   OneTouch Delica Lancets 62M MISC To check blood sugar daily  DX: E11.9   ONETOUCH VERIO test strip TO CHECK BLOOD SUGAR ONCE DAILY.   oxybutynin (DITROPAN-XL) 10 MG 24 hr tablet Take 1 tablet (10 mg total) by mouth at bedtime.   Semaglutide,0.25 or 0.5MG/DOS, (OZEMPIC, 0.25 OR 0.5 MG/DOSE,) 2 MG/3ML SOPN Inject 0.5 mg into the skin  once a week. Patient receives via [name] Patient Assistance through December 2023   sertraline (ZOLOFT) 25 MG tablet Take 1 tablet (25 mg total) by mouth daily.   TRULICITY 1.5 BT/5.9RC SOPN SMARTSIG:1 pre-filled pen syringe SUB-Q Once a Week   Vitamin D, Ergocalciferol, (DRISDOL) 1.25 MG (50000 UNIT) CAPS capsule Take 1 capsule (50,000 Units total) by mouth every 7 (seven) days.   No facility-administered medications prior to visit.    Review of Systems  Last CBC Lab Results  Component Value Date   WBC 7.6 12/20/2020   HGB 10.5 (L) 12/20/2020   HCT 32.7 (L) 12/20/2020   MCV 90.1 12/20/2020   MCH 28.9 12/20/2020   RDW 17.7 (H) 12/20/2020   PLT 211 16/38/4536   Last metabolic panel Lab Results  Component Value Date   GLUCOSE 243 (H) 06/02/2021   NA 138 06/03/2021   K 5.0 06/03/2021   CL 101 06/02/2021   CO2 22 06/02/2021   BUN 30 (H) 06/02/2021   CREATININE 1.1 06/03/2021   EGFR 51 06/03/2021   CALCIUM 9.4 06/03/2021   PROT 6.7 06/02/2021   ALBUMIN 4.2 06/03/2021   LABGLOB 2.5 06/02/2021   AGRATIO 1.7 06/02/2021   BILITOT 0.6 06/02/2021   ALKPHOS 144 (H) 06/02/2021   AST 15 06/03/2021   ALT 10 06/03/2021   ANIONGAP 5 12/22/2020   Last lipids Lab Results  Component Value Date   CHOL 136 06/03/2021   HDL 66 06/03/2021   LDLCALC 41 06/03/2021   TRIG 178 (A) 06/03/2021   CHOLHDL 2.1 06/02/2021   Last hemoglobin A1c Lab Results  Component Value Date   HGBA1C 10.4 (A) 09/03/2021   Last thyroid functions Lab Results  Component Value Date   TSH 1.88 06/03/2021   Last vitamin D No results found for: "25OHVITD2", "25OHVITD3", "VD25OH" Last vitamin B12 and Folate Lab Results  Component Value Date   VITAMINB12 1,375 (H) 11/23/2020       Objective    BP (!) 127/56 (BP Location: Left Arm, Patient Position: Sitting, Cuff Size: Normal)   Pulse 91   Resp 17   Ht '4\' 9"'  (1.448 m)   Wt 141 lb (64 kg)   SpO2 99%   BMI 30.51 kg/m  BP Readings from Last 3  Encounters:  12/10/21 (!) 127/56  09/03/21 101/63  06/02/21 115/71   Wt Readings from Last 3 Encounters:  12/10/21 141 lb (64 kg)  09/03/21 143 lb 8 oz (65.1 kg)  06/30/21 137 lb (62.1 kg)   SpO2 Readings from Last 3 Encounters:  12/10/21 99%  09/03/21 93%  06/02/21 98%      Physical Exam Vitals and nursing note reviewed.  Constitutional:      General: She is not in acute distress.    Appearance: Normal appearance. She is obese. She is not ill-appearing, toxic-appearing or diaphoretic.  HENT:     Head: Normocephalic and atraumatic.  Cardiovascular:     Rate and Rhythm: Normal rate and regular rhythm.     Pulses: Normal  pulses.     Heart sounds: Normal heart sounds. No murmur heard.    No friction rub. No gallop.  Pulmonary:     Effort: Pulmonary effort is normal. No respiratory distress.     Breath sounds: Normal breath sounds. No stridor. No wheezing, rhonchi or rales.  Chest:     Chest wall: No tenderness.  Musculoskeletal:        General: No swelling, tenderness, deformity or signs of injury. Normal range of motion.     Right lower leg: No edema.     Left lower leg: No edema.  Skin:    General: Skin is warm and dry.     Capillary Refill: Capillary refill takes less than 2 seconds.     Coloration: Skin is not jaundiced or pale.     Findings: No bruising, erythema, lesion or rash.  Neurological:     General: No focal deficit present.     Mental Status: She is alert and oriented to person, place, and time. Mental status is at baseline.     Cranial Nerves: No cranial nerve deficit.     Sensory: No sensory deficit.     Motor: No weakness.     Coordination: Coordination normal.  Psychiatric:        Mood and Affect: Mood normal.        Behavior: Behavior normal.        Thought Content: Thought content normal.        Judgment: Judgment normal.      No results found for any visits on 12/10/21.  Assessment & Plan     Problem List Items Addressed This Visit        Cardiovascular and Mediastinum   Chronic systolic CHF (congestive heart failure) (HCC)    Chronic, stable On lasix 20 and metop 50 F/b cards       Relevant Orders   Comprehensive Metabolic Panel (CMET)   Hypertension associated with diabetes ([facility])    Chronic, stable At goal Continue 25 losartan      Relevant Medications   TRULICITY 1.5 MA/2.6JF SOPN     Respiratory   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 (HCC)    Decreased inspiratory and expiratory efforts; denies SOB/DOE Continued use of walker and/or cane; request for signature for handicap form/plaquard  Get CXR to see if pt has pleural effusion given chronic history of heart concerns       Relevant Orders   DG Chest 2 View     Endocrine   Type 2 diabetes mellitus with diabetic polyneuropathy, without long-term current use of insulin (HCC)    Chronic, elevated previously Goal <8%       Relevant Medications   TRULICITY 1.5 HL/4.5GY SOPN   Other Relevant Orders   Hemoglobin A1c   Urine Microalbumin w/creat. ratio   Type 2 diabetes mellitus with hyperlipidemia (HCC) - Primary    Chronic, previously uncontrolled On Arb On statin Due for eye exam- encouraged to call and schedule Repeat A1c Continue to recommend balanced, lower carb meals. Smaller meal size, adding snacks. Choosing water as drink of choice and increasing purposeful exercise. Currently taking 32 u tresiba and 0.5 mg ozempic and trulicity 1.5 mg/wk      Relevant Medications   TRULICITY 1.5 BW/3.8LH SOPN   Other Relevant Orders   Lipid panel     Other   Hyperlipidemia LDL goal <70    D/t CAD and DM Repeat LP Currently on atorvastatin 20  Relevant Orders   Lipid panel   Need for influenza vaccination    Consented; VIS made available; no immediate side effects following administration; plan to repeat annually        Relevant Orders   Flu Vaccine QUAD High Dose(Fluad) (Completed)     Return in about 3 months (around  03/12/2022) for chonic disease management.      Vonna Kotyk, FNP, have reviewed all documentation for this visit. The documentation on 12/10/21 for the exam, diagnosis, procedures, and orders are all accurate and complete.    [name], [place] [phone] (phone) [phone] (fax)  [place]

## 2021-12-10 ENCOUNTER — Ambulatory Visit
Admission: RE | Admit: 2021-12-10 | Discharge: 2021-12-10 | Disposition: A | Discharge status: Home / Self Care | Payer: Medicare Other | Attending: Family Medicine | Admitting: Family Medicine

## 2021-12-10 ENCOUNTER — Ambulatory Visit (INDEPENDENT_AMBULATORY_CARE_PROVIDER_SITE_OTHER): Payer: Medicare Other | Admitting: Family Medicine

## 2021-12-10 ENCOUNTER — Ambulatory Visit
Admission: RE | Admit: 2021-12-10 | Discharge: 2021-12-10 | Disposition: A | Discharge status: Home / Self Care | Payer: Medicare Other | Source: Ambulatory Visit | Attending: Family Medicine | Admitting: Family Medicine

## 2021-12-10 ENCOUNTER — Encounter: Payer: Self-pay | Admitting: Family Medicine

## 2021-12-10 VITALS — BP 127/56 | HR 91 | Resp 17 | Ht <= 58 in | Wt 141.0 lb

## 2021-12-10 DIAGNOSIS — E1142 Type 2 diabetes mellitus with diabetic polyneuropathy: Secondary | ICD-10-CM

## 2021-12-10 DIAGNOSIS — Z23 Encounter for immunization: Secondary | ICD-10-CM

## 2021-12-10 DIAGNOSIS — J181 Lobar pneumonia, unspecified organism: Secondary | ICD-10-CM

## 2021-12-10 DIAGNOSIS — I152 Hypertension secondary to endocrine disorders: Secondary | ICD-10-CM | POA: Diagnosis not present

## 2021-12-10 DIAGNOSIS — E785 Hyperlipidemia, unspecified: Secondary | ICD-10-CM

## 2021-12-10 DIAGNOSIS — I5022 Chronic systolic (congestive) heart failure: Secondary | ICD-10-CM | POA: Diagnosis not present

## 2021-12-10 DIAGNOSIS — E1159 Type 2 diabetes mellitus with other circulatory complications: Secondary | ICD-10-CM | POA: Diagnosis not present

## 2021-12-10 DIAGNOSIS — E1169 Type 2 diabetes mellitus with other specified complication: Secondary | ICD-10-CM | POA: Diagnosis not present

## 2021-12-10 HISTORY — DX: Lobar pneumonia, unspecified organism: J18.1

## 2021-12-10 MED ORDER — MONTELUKAST SODIUM 10 MG PO TABS: 10.0000 mg | ORAL_TABLET | Freq: Every day | ORAL | 3 refills | Status: DC

## 2021-12-10 NOTE — Assessment & Plan Note (Signed)
D/t CAD and DM Repeat LP Currently on atorvastatin 20

## 2021-12-10 NOTE — Assessment & Plan Note (Signed)
Chronic, stable At goal Continue 25 losartan

## 2021-12-10 NOTE — Assessment & Plan Note (Signed)
Consented; VIS made available; no immediate side effects following administration; plan to repeat annually   

## 2021-12-10 NOTE — Assessment & Plan Note (Signed)
Chronic, stable On lasix 20 and metop 50 F/b cards

## 2021-12-10 NOTE — Assessment & Plan Note (Signed)
Chronic, previously uncontrolled On Arb On statin Due for eye exam- encouraged to call and schedule Repeat A1c Continue to recommend balanced, lower carb meals. Smaller meal size, adding snacks. Choosing water as drink of choice and increasing purposeful exercise. Currently taking 32 u tresiba and 0.5 mg ozempic and trulicity 1.5 mg/wk

## 2021-12-10 NOTE — Assessment & Plan Note (Signed)
Decreased inspiratory and expiratory efforts; denies SOB/DOE Continued use of walker and/or cane; request for signature for handicap form/plaquard  Get CXR to see if pt has pleural effusion given chronic history of heart concerns

## 2021-12-10 NOTE — Assessment & Plan Note (Signed)
Chronic, elevated previously Goal <8%

## 2021-12-11 ENCOUNTER — Telehealth: Payer: Self-pay

## 2021-12-11 LAB — COMPREHENSIVE METABOLIC PANEL
ALT: 8 IU/L (ref 0–32)
AST: 13 IU/L (ref 0–40)
Albumin/Globulin Ratio: 1.8 (ref 1.2–2.2)
Albumin: 4.3 g/dL (ref 3.8–4.8)
Alkaline Phosphatase: 132 IU/L — ABNORMAL HIGH (ref 44–121)
BUN/Creatinine Ratio: 25 (ref 12–28)
BUN: 30 mg/dL — ABNORMAL HIGH (ref 8–27)
Bilirubin Total: 0.6 mg/dL (ref 0.0–1.2)
CO2: 21 mmol/L (ref 20–29)
Calcium: 9.7 mg/dL (ref 8.7–10.3)
Chloride: 101 mmol/L (ref 96–106)
Creatinine, Ser: 1.18 mg/dL — ABNORMAL HIGH (ref 0.57–1.00)
Globulin, Total: 2.4 g/dL (ref 1.5–4.5)
Glucose: 144 mg/dL — ABNORMAL HIGH (ref 70–99)
Potassium: 5.1 mmol/L (ref 3.5–5.2)
Sodium: 139 mmol/L (ref 134–144)
Total Protein: 6.7 g/dL (ref 6.0–8.5)
eGFR: 48 mL/min/{1.73_m2} — ABNORMAL LOW (ref >59)

## 2021-12-11 LAB — HEMOGLOBIN A1C
Est. average glucose Bld gHb Est-mCnc: 200 mg/dL
Hgb A1c MFr Bld: 8.6 % — ABNORMAL HIGH (ref 4.8–5.6)

## 2021-12-11 LAB — LIPID PANEL
Chol/HDL Ratio: 2.3 ratio (ref 0.0–4.4)
Cholesterol, Total: 132 mg/dL (ref 100–199)
HDL: 58 mg/dL (ref >39)
LDL Chol Calc (NIH): 45 mg/dL (ref 0–99)
Triglycerides: 180 mg/dL — ABNORMAL HIGH (ref 0–149)
VLDL Cholesterol Cal: 29 mg/dL (ref 5–40)

## 2021-12-11 LAB — MICROALBUMIN / CREATININE URINE RATIO
Creatinine, Urine: 41.5 mg/dL
Microalb/Creat Ratio: 138 mg/g creat — ABNORMAL HIGH (ref 0–29)
Microalbumin, Urine: 57.1 ug/mL

## 2021-12-11 LAB — SPECIMEN STATUS REPORT

## 2021-12-11 NOTE — Progress Notes (Signed)
    Chronic Care Management Pharmacy Assistant   Name: Andrea [name]  MRN: [id number] DOB: 12-19-1945  Reason for Encounter: 2024 Patient Assistance Renewal for Andrea [name] and Andrea [name]   Patient is currently receiving patient assistance for her Andrea [name] and Ozempic through [place] requires all Medicare patient's to renew their applications starting 94/80/1655.  Renewal Application started, and will be mailed to the patient's home address for her to complete. Once completed by the patient she will be instructed to return the application along with her proof of income to [facility].  Application e-mailed to PTM so she can mail the application to the patient's home address. Patient can contact me directly @ [phone] if she has any questions.  Medications: Outpatient Encounter Medications as of 12/11/2021  Medication Sig   Alcohol Swabs (ALCOHOL PADS) 70 % PADS 1 each by Does not apply route in the morning and at bedtime.   atorvastatin (LIPITOR) 20 MG tablet Take 1 tablet (20 mg total) by mouth at bedtime.   Calcium Carbonate-Vitamin D 600-400 MG-UNIT tablet Take 1 tablet by mouth daily.   clobetasol (TEMOVATE) 0.05 % external solution Apply 1 application. topically 2 (two) times daily.   Cyanocobalamin (B-12) 1000 MCG CAPS Take 1,000 mcg by mouth daily.   furosemide (LASIX) 20 MG tablet Take 1 tablet (20 mg total) by mouth daily.   hydrOXYzine (ATARAX) 25 MG tablet TAKE 1 TABLET BY MOUTH THREE TIMES A DAY AS NEEDED   insulin degludec (TRESIBA FLEXTOUCH) 100 UNIT/ML FlexTouch Pen Inject 32 Units into the skin at bedtime. Patient receives via [name] Patient Assistance through December 2023   Insulin Pen Needle (TRUEPLUS PEN NEEDLES) 31G X 6 MM MISC Use with lantus two times daily.   INSULIN SYRINGE 1CC/29G 29G X 1/2" 1 ML MISC For lantus injections twice daily   losartan (COZAAR) 25 MG tablet Take 1 tablet (25 mg  total) by mouth daily.   metoprolol succinate (TOPROL-XL) 50 MG 24 hr tablet Take 1 tablet (50 mg total) by mouth daily.   montelukast (SINGULAIR) 10 MG tablet Take 1 tablet (10 mg total) by mouth at bedtime.   Multiple Vitamins-Minerals (PRESERVISION AREDS 2 PO) Take 1 tablet by mouth in the morning and at bedtime.   Omega-3 1000 MG CAPS Take 1,000 mg by mouth daily.   omeprazole (PRILOSEC) 40 MG capsule Take 1 capsule (40 mg total) by mouth at bedtime.   OneTouch Delica Lancets 75Q MISC To check blood sugar daily  DX: E11.9   ONETOUCH VERIO test strip TO CHECK BLOOD SUGAR ONCE DAILY.   oxybutynin (DITROPAN-XL) 10 MG 24 hr tablet Take 1 tablet (10 mg total) by mouth at bedtime.   Semaglutide,0.25 or 0.'5MG'$ /DOS, (OZEMPIC, 0.25 OR 0.5 MG/DOSE,) 2 MG/3ML SOPN Inject 0.5 mg into the skin once a week. Patient receives via [name] Patient Assistance through December 2023   sertraline (ZOLOFT) 25 MG tablet Take 1 tablet (25 mg total) by mouth daily.   TRULICITY 1.5 GB/2.0FE SOPN SMARTSIG:1 pre-filled pen syringe SUB-Q Once a Week   Vitamin D, Ergocalciferol, (DRISDOL) 1.25 MG (50000 UNIT) CAPS capsule Take 1 capsule (50,000 Units total) by mouth every 7 (seven) days.   No facility-administered encounter medications on file as of 12/11/2021.    Lynann Bologna, CPA/CMA Clinical Pharmacist Assistant Phone: [phone]

## 2021-12-11 NOTE — Progress Notes (Signed)
Urine micro albumin is moderately elevated. Continue to recommend balanced, lower carb meals. Smaller meal size, adding snacks. Choosing water as drink of choice and increasing purposeful exercise. Continue to work with Cristie Hem on medication to assist DM control with A1c goal of <8%. Recommend repeat in 6 months.  [name], [address] [place], [place] [address] [phone] (phone) [phone] (fax) [place]

## 2021-12-11 NOTE — Progress Notes (Signed)
A1c is much improved; continues to remain elevated above goal of <8%. I will reach out to [facility], Pharmacist for further medication management.  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  [name], [address] [place], [place] [address] [phone] (phone) [phone] (fax) [place]

## 2021-12-13 NOTE — Progress Notes (Signed)
Normal CXR; reassuring. Continue to stay safe and active this respiratory season.  Take care, [name], [address] [place], [place] [address] [phone] (phone) [phone] (fax) [place]

## 2021-12-28 ENCOUNTER — Ambulatory Visit (INDEPENDENT_AMBULATORY_CARE_PROVIDER_SITE_OTHER): Payer: Medicare Other

## 2021-12-28 DIAGNOSIS — I5022 Chronic systolic (congestive) heart failure: Secondary | ICD-10-CM

## 2021-12-28 DIAGNOSIS — E1142 Type 2 diabetes mellitus with diabetic polyneuropathy: Secondary | ICD-10-CM

## 2021-12-28 MED ORDER — DAPAGLIFLOZIN PROPANEDIOL 10 MG PO TABS: 10.0000 mg | ORAL_TABLET | Freq: Every day | ORAL | 0 refills | Status: DC

## 2021-12-28 NOTE — Addendum Note (Signed)
Addended by: Daron Offer A on: 12/28/2021 11:37 AM   Modules accepted: Orders

## 2021-12-28 NOTE — Progress Notes (Cosign Needed Addendum)
Chronic Care Management Pharmacy Note  12/28/2021 Name:  BELEM HINTZE MRN:  [id number] DOB:  06-16-1945  Summary: Patient presents for CCM follow-up.   -Patient previously had taken Jardiance, but stopped the medication due to a "bad" yeast infection. At the time, her A1c was 10-11%, putting her at a higher risk of UTIs. Now that her blood sugars are improving her chance of recurrent UTI may be lower. Patient was amenable to trying Wilder Glade to reduce of heart failure exacerbation and kidney disease. Counseled patient on importance of adequate hydration with water and proper hygiene to decrease likelihood of recurrent infections.   Recommendations/Changes made from today's visit: START Farxiga 10 mg daily. Will supply 30-day voucher to pharmacy for patient and start PAP if she tolerates.   Plan: HC Assessment in 2 weeks to assess tolerability of Farxiga  CPP follow-up 1 month Recheck BMP in one month   Recommended Problem List Changes:  Add: Chronic Kidney Disease Stage 3a   Subjective: NADRA HRITZ is an 76 y.o. year old female who is a primary patient of [name], FNP.  The CCM team was consulted for assistance with disease management and care coordination needs.    Engaged with patient by telephone for follow up visit in response to provider referral for pharmacy case management and/or care coordination services.   Consent to Services:  The patient was given information about Chronic Care Management services, agreed to services, and gave verbal consent prior to initiation of services.  Please see initial visit note for detailed documentation.   Patient Care Team: [name], FNP as PCP - General (Family Medicine) Minna Merritts, MD as PCP - Cardiology (Cardiology) Estill Cotta, MD as Consulting Physician (Ophthalmology) Newman Pies, MD as Consulting Physician (Neurosurgery) Lucky Cowboy Erskine Squibb, MD as Referring Physician (Vascular Surgery) [name],  MD as Consulting Physician (Gastroenterology) Germaine Pomfret, [facility] (Pharmacist)  Recent office visits: 12/10/21: Patient presented to [name], FNP for follow-up. A1c 8.6%.   09/03/21: Patient presented to [name], FNP for follow-up. A1c 10.4%.   06/02/2021 [name], FNP (PCP Office Visit) for Type II DM- Stopped: Amoxicillin due to patient not taking, Started: Clobetasol Propionate 9.62% 1 application daily, Lab orders placed, Referral for Dermatology placed, Patient to follow-up in 4 months   Recent consult visits: 05/26/2021 Daylene Katayama, DPM (Podiatry) for Diabetic Ulcer- No medication changes noted, No orders placed, Patient to follow-up as needed   04/28/2021 Ida Rogue, MD (Cardiology) for 1 year follow-up- Changed: Metoprolol Succinate 25 mg 1 tablet daily to twice daily, EKG 12-Lead ordered, Patient to follow-up in 1 year   04/21/2021 Daylene Katayama, DPM (Podiatry) for Diabetic Ulcer- No medication changes noted, DG Foot Complete Left ordered, Patient to follow-up in 4 weeks   03/24/2021 Daylene Katayama, DPM (Podiatry) for Wound Check-No medication changes noted, DG Foot Complete Left ordered, Patient to follow-up in 4 weeks   Hospital visits: None in previous 6 months   Objective:  Lab Results  Component Value Date   CREATININE 1.18 (H) 12/10/2021   BUN 30 (H) 12/10/2021   EGFR 48 (L) 12/10/2021   GFRNONAA 46 (L) 12/22/2020   GFRAA 70 02/11/2020   NA 139 12/10/2021   K 5.1 12/10/2021   CALCIUM 9.7 12/10/2021   CO2 21 12/10/2021   GLUCOSE 144 (H) 12/10/2021    Lab Results  Component Value Date/Time   HGBA1C 8.6 (H) 12/10/2021 11:37 AM   HGBA1C 10.4 (A) 09/03/2021 02:21 PM  HGBA1C 11.3 06/03/2021 04:44 PM   HGBA1C 11.3 (A) 06/02/2021 01:59 PM   HGBA1C 11.3 (H) 06/02/2021 12:00 AM   HGBA1C 7.5 (H) 11/26/2013 04:14 AM   MICROALBUR 20 12/16/2016 03:10 PM    Last diabetic Eye exam:  Lab Results  Component Value Date/Time   HMDIABEYEEXA No Retinopathy  04/08/2021 12:00 AM    Last diabetic Foot exam: No results found for: "HMDIABFOOTEX"   Lab Results  Component Value Date   CHOL 132 12/10/2021   HDL 58 12/10/2021   LDLCALC 45 12/10/2021   TRIG 180 (H) 12/10/2021   CHOLHDL 2.3 12/10/2021       Latest Ref Rng & Units 12/10/2021   11:37 AM 06/03/2021    4:44 PM 06/02/2021   12:00 AM  Hepatic Function  Total Protein 6.0 - 8.5 g/dL 6.7   6.7   Albumin 3.8 - 4.8 g/dL 4.3  4.2     4.2   AST 0 - 40 IU/L '13  15     15   ' ALT 0 - 32 IU/L '8  10     10   ' Alk Phosphatase 44 - 121 IU/L 132   144   Total Bilirubin 0.0 - 1.2 mg/dL 0.6   0.6      This result is from an external source.    Lab Results  Component Value Date/Time   TSH 1.88 06/03/2021 04:44 PM   TSH 1.880 06/02/2021 12:00 AM   TSH 1.220 12/28/2018 04:26 PM   FREET4 1.33 06/02/2021 12:00 AM       Latest Ref Rng & Units 12/20/2020    4:49 AM 12/19/2020    5:45 AM 12/18/2020   10:22 AM  CBC  WBC 4.0 - 10.5 K/uL 7.6  7.0  9.5   Hemoglobin 12.0 - 15.0 g/dL 10.5  10.4  11.4   Hematocrit 36.0 - 46.0 % 32.7  31.8  35.1   Platelets 150 - 400 K/uL 211  189  188     No results found for: "VD25OH"  Clinical ASCVD: Yes  The ASCVD Risk score (Arnett DK, et al., 2019) failed to calculate for the following reasons:   The patient has a prior MI or stroke diagnosis       12/10/2021   11:21 AM 06/30/2021    1:04 PM 11/17/2020    4:03 PM  Depression screen PHQ 2/9  Decreased Interest 0 0 0  Down, Depressed, Hopeless 0 0 0  PHQ - 2 Score 0 0 0  Altered sleeping 0    Tired, decreased energy 0    Change in appetite 0    Feeling bad or failure about yourself  0    Trouble concentrating 0    Moving slowly or fidgety/restless 0    Suicidal thoughts 0    PHQ-9 Score 0    Difficult doing work/chores Not difficult at all      -Last DEXA Scan: 03/07/18   T-Score femoral neck: -3.0  T-Score total hip: NA  T-Score lumbar spine: -0.4  T-Score forearm radius: -4.0  Social History    Tobacco Use  Smoking Status Former   Packs/day: 1.00   Years: 30.00   Total pack years: 30.00   Types: Cigarettes   Quit date: 07/17/2012   Years since quitting: 9.4  Smokeless Tobacco Never  Tobacco Comments       BP Readings from Last 3 Encounters:  12/10/21 (!) 127/56  09/03/21 101/63  06/02/21 115/71   Pulse Readings  from Last 3 Encounters:  12/10/21 91  09/03/21 90  06/02/21 91   Wt Readings from Last 3 Encounters:  12/10/21 141 lb (64 kg)  09/03/21 143 lb 8 oz (65.1 kg)  06/30/21 137 lb (62.1 kg)   BMI Readings from Last 3 Encounters:  12/10/21 30.51 kg/m  09/03/21 28.03 kg/m  06/30/21 26.76 kg/m    Assessment/Interventions: Review of patient past medical history, allergies, medications, health status, including review of consultants reports, laboratory and other test data, was performed as part of comprehensive evaluation and provision of chronic care management services.   SDOH:  (Social Determinants of Health) assessments and interventions performed: Yes SDOH Interventions    Flowsheet Row Chronic Care Management from 06/23/2021 in [facility] Most recent reading at 07/06/2021 10:27 AM Clinical Support from 06/30/2021 in [facility] Most recent reading at 06/30/2021  1:05 PM Chronic Care Management from 11/17/2020 in [facility] Most recent reading at 11/17/2020  3:05 PM Office Visit from 12/26/2019 in [facility] Most recent reading at 12/26/2019 11:13 AM  SDOH Interventions      Food Insecurity Interventions -- Intervention Not Indicated Intervention Not Indicated --  Housing Interventions -- Intervention Not Indicated -- --  Transportation Interventions Intervention Not Indicated Intervention Not Indicated [id number] Referral --  Depression Interventions/Treatment  -- -- -- Medication  Financial Strain Interventions Other (Comment)  [PAP] Intervention Not Indicated -- --  Physical Activity  Interventions -- Intervention Not Indicated -- --  Stress Interventions -- Intervention Not Indicated -- --  Social Connections Interventions -- Intervention Not Indicated -- --       SDOH Screenings   Food Insecurity: No Food Insecurity (06/30/2021)  Housing: Low Risk  (06/30/2021)  Transportation Needs: No Transportation Needs (07/06/2021)  Alcohol Screen: Low Risk  (12/10/2021)  Depression (PHQ2-9): Low Risk  (12/10/2021)  Financial Resource Strain: High Risk (07/06/2021)  Physical Activity: Sufficiently Active (06/30/2021)  Social Connections: Moderately Isolated (06/30/2021)  Stress: No Stress Concern Present (06/30/2021)  Tobacco Use: Medium Risk (12/10/2021)    CCM Care Plan  Allergies  Allergen Reactions   Chlorhexidine    Metformin And Related Diarrhea   Sulfa Antibiotics Rash    Medications Reviewed Today     Reviewed by [name], FNP (Family Nurse Practitioner) on 12/10/21 at 1510  Med List Status: <None>   Medication Order Taking? Sig Documenting Provider Last Dose Status Informant  Alcohol Swabs (ALCOHOL PADS) 70 % PADS [id number] Yes 1 each by Does not apply route in the morning and at bedtime. [name], FNP Taking Active   atorvastatin (LIPITOR) 20 MG tablet [id number] Yes Take 1 tablet (20 mg total) by mouth at bedtime. [name], FNP Taking Active   Calcium Carbonate-Vitamin D 600-400 MG-UNIT tablet [id number] Yes Take 1 tablet by mouth daily. [provider] Taking Active Nursing Home Medication Administration Guide (MAG)  clobetasol (TEMOVATE) 0.05 % external solution [id number] Yes Apply 1 application. topically 2 (two) times daily. [name], FNP Taking Active   Cyanocobalamin (B-12) 1000 MCG CAPS [id number] Yes Take 1,000 mcg by mouth daily. [provider] Taking Active Nursing Home Medication Administration Guide (MAG)  furosemide (LASIX) 20 MG tablet [id number] Yes Take 1 tablet (20 mg total) by mouth daily. [name], FNP  Taking Active   hydrOXYzine (ATARAX) 25 MG tablet [id number] Yes TAKE 1 TABLET BY MOUTH THREE TIMES A DAY AS NEEDED [name], FNP Taking Active   insulin degludec (TRESIBA  FLEXTOUCH) 100 UNIT/ML FlexTouch Pen [id number] Yes Inject 32 Units into the skin at bedtime. Patient receives via [name] Patient Assistance through December 2023 [name] T, FNP Taking Active   Insulin Pen Needle (TRUEPLUS PEN NEEDLES) 31G X 6 MM MISC [id number] Yes Use with lantus two times daily. Virginia Crews, MD Taking Active Nursing Home Medication Administration Guide (MAG)  INSULIN SYRINGE 1CC/29G 29G X 1/2" 1 ML MISC [id number] Yes For lantus injections twice daily Mar Daring, PA-C Taking Active Nursing Home Medication Administration Guide (MAG)  losartan (COZAAR) 25 MG tablet [id number] Yes Take 1 tablet (25 mg total) by mouth daily. [name], FNP Taking Active   metoprolol succinate (TOPROL-XL) 50 MG 24 hr tablet [id number] Yes Take 1 tablet (50 mg total) by mouth daily. [name], FNP Taking Active   montelukast (SINGULAIR) 10 MG tablet [id number] Yes Take 1 tablet (10 mg total) by mouth at bedtime. [name], FNP  Active   Multiple Vitamins-Minerals (PRESERVISION AREDS 2 PO) [id number] Yes Take 1 tablet by mouth in the morning and at bedtime. [provider] Taking Active Nursing Home Medication Administration Guide (MAG)  Omega-3 1000 MG CAPS [id number] Yes Take 1,000 mg by mouth daily. [provider] Taking Active Nursing Home Medication Administration Guide (MAG)  omeprazole (PRILOSEC) 40 MG capsule [id number] Yes Take 1 capsule (40 mg total) by mouth at bedtime. [name], FNP Taking Active   [facility] Lancets [address] [place] [id number] Yes To check blood sugar daily  DX: E11.9 [name], FNP Taking Active   [facility] VERIO test strip [id number] Yes TO CHECK BLOOD SUGAR ONCE DAILY. [name], FNP Taking Active   oxybutynin (DITROPAN-XL) 10 MG 24 hr  tablet [id number] Yes Take 1 tablet (10 mg total) by mouth at bedtime. [name], FNP Taking Active   Semaglutide,0.25 or 0.5MG/DOS, (OZEMPIC, 0.25 OR 0.5 MG/DOSE,) 2 MG/3ML SOPN [id number] Yes Inject 0.5 mg into the skin once a week. Patient receives via [name] Patient Assistance through December 2023 [name], FNP Taking Active            Med Note Michaelle Birks, Anayansi Rundquist A   Fri Nov 13, 2021  9:36 AM)    sertraline (ZOLOFT) 25 MG tablet [id number] Yes Take 1 tablet (25 mg total) by mouth daily. [name], FNP Taking Active   TRULICITY 1.5 GY/1.7CB [facility] Aid [id number] Yes SMARTSIG:1 pre-filled pen syringe SUB-Q Once a Week [provider] Taking Active   Vitamin D, Ergocalciferol, (DRISDOL) 1.25 MG (50000 UNIT) CAPS capsule [id number] Yes Take 1 capsule (50,000 Units total) by mouth every 7 (seven) days. [name], FNP Taking Active   Med List Note Loann Quill, CPhT 12/18/20 1047): [employer] [phone]            Patient Active Problem List   Diagnosis Date Noted   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 ([place]) 12/10/2021   Type 2 diabetes mellitus with diabetic polyneuropathy, without long-term current use of insulin ([place]) 12/10/2021   Need for influenza vaccination 12/10/2021   Gastroesophageal reflux disease without esophagitis 11/13/2021   OAB (overactive bladder) 11/13/2021   Anxiety 11/13/2021   Vitamin D deficiency 11/13/2021   Dependent on wheelchair 09/03/2021   Impaired mobility 09/03/2021   Hypertension associated with diabetes ([place]) 09/03/2021   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 ([place]) 09/03/2021   Need for pneumococcal vaccination 06/02/2021   Hair loss 06/02/2021  Hyperlipidemia LDL goal <70 04/28/2021   Chronic heel pain, left 04/02/2021   MRSA (methicillin resistant staph aureus) culture positive 02/05/2021   Hospital discharge follow-up 01/23/2021   Weight loss, unintentional 01/23/2021   Primary insomnia  01/23/2021   Loss of appetite for more than 2 weeks 01/23/2021   Hypomagnesemia    Hypokalemia    Essential hypertension    Tachycardia    Depression    Acute metabolic encephalopathy 50/53/9767   Acute kidney injury superimposed on CKD ([place]) 12/18/2020   Delirium 34/19/3790   Chronic systolic CHF (congestive heart failure) ([place]) 12/18/2020   Osteomyelitis of foot, left, acute ([place]) 12/18/2020   Status post peripherally inserted central catheter (PICC) central line placement    Osteomyelitis ([place]) 11/17/2020   Cellulitis of left lower extremity 11/13/2020   Hypoglycemia 11/13/2020   Non healing left heel wound 11/13/2020   Status post total hip replacement, 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   Pain due to onychomycosis of toenail of left foot 03/06/2020   Malaise and fatigue 12/28/2019   UTI (urinary tract infection) 12/28/2019   Hyponatremia 12/27/2019   Intertrigo 12/27/2019   History of meningitis 11/23/2019   CSF leak from nose 11/22/2019   History of myocardial infarction 03/09/2019   PAD (peripheral artery disease) ([place]) 12/28/2018   Leg length discrepancy 09/21/2017   Coronary artery disease involving native coronary artery 01/06/2017   Drug-induced constipation 01/06/2017   Spinal stenosis of lumbar region 01/06/2017   Spondylolisthesis of lumbar region 12/30/2016   Obese 04/16/2015   History of colon polyps 11/23/2012   Bundle branch block, 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   Personal history of transient ischemic attack (TIA), and cerebral infarction without residual deficits 11/23/2012   Encounter for long-term (current) use of aspirin 11/23/2012   Status post percutaneous transluminal coronary angioplasty 11/23/2012   Transient ischemic attack (TIA), and cerebral infarction without residual deficits(V12.54) 11/23/2012   Anemia 08/17/2012   Chronic obstructive pulmonary disease ([place]) 08/17/2012   HLD (hyperlipidemia) 08/17/2012   OP (osteoporosis) 08/17/2012   Type 2 diabetes mellitus with  hyperlipidemia ([place]) 08/17/2012   Ischemic heart disease due to coronary artery obstruction ([place]) 07/24/2012    Immunization History  Administered Date(s) Administered   Fluad Quad(high Dose 65+) 12/01/2020, 12/10/2021   Influenza, High Dose Seasonal PF 02/12/2016, 12/16/2016, 12/19/2017   Influenza,inj,Quad PF,6+ Mos 12/09/2019   PFIZER(Purple Top)SARS-COV-2 Vaccination 07/14/2019, 08/14/2019   Pfizer Covid-19 Vaccine Bivalent Booster 5y-11y 01/27/2021   Pneumococcal Polysaccharide-23 06/02/2021   Zoster Recombinat (Shingrix) 05/07/2018, 11/21/2018    Conditions to be addressed/monitored:  Hypertension, Hyperlipidemia, Diabetes, Heart Failure, Coronary Artery Disease, GERD, COPD, Anxiety, Osteoporosis, and Insomnia, History of TIA  Care Plan : General Pharmacy (Adult)  Updates made by Germaine Pomfret, RPH since 12/28/2021 12:00 AM     Problem: Hypertension, Hyperlipidemia, Diabetes, Heart Failure, Coronary Artery Disease, GERD, COPD, Anxiety, Osteoporosis, and Insomnia, History of TIA   Priority: High     Long-Range Goal: Patient-Specific Goal   Start Date: 06/23/2021  Expected End Date: 07/07/2022  This Visit's Progress: On track  Recent Progress: On track  Priority: High  Note:   Current Barriers:  Unable to independently afford treatment regimen Unable to achieve control of diabetes   Pharmacist Clinical Goal(s):  Patient will verbalize ability to afford treatment regimen achieve control of diabetes as evidenced by A1c less than 8% through collaboration with PharmD and provider.   Interventions: 1:1 collaboration with [name], FNP regarding development and update of comprehensive plan of care as evidenced  by provider attestation and co-signature Inter-disciplinary care team collaboration (see longitudinal plan of care) Comprehensive medication review performed; medication list updated in electronic medical record  Heart Failure (Goal: manage symptoms and  prevent exacerbations) -Controlled: Not addressed during this visit. -Last ejection fraction: 40-45% (Date: Jan 2023) -HF type: Systolic -NYHA Class: III (marked limitation of activity) -Current treatment: Furosemide 20 mg daily: Appropriate, Effective, Safe, Accessible Losartan 25 mg daily: Appropriate, Effective, Safe, Accessible  Metoprolol XL 50 mg daily: Appropriate, Effective, Safe, Accessible  -Medications previously tried: NA  -Only taking metoprolol 25 mg daily due to not understanding instructions.  -Current home BP/HR readings: Not monitoring -Will change metoprolol to 50 mg daily for simplicity and improved adherence  Hyperlipidemia: (LDL goal < 55) -Controlled -History of PAD, CAD, TIA -Current treatment: Atorvastatin 20 mg daily: Appropriate, Effective, Safe, Accessible  -Medications previously tried: NA  -Recommended to continue current medication  Diabetes (A1c goal <8%) -Uncontrolled -Current medications: Tresiba 32 units daily (0.38 u/kg): Appropriate, Query effective  Ozempic 0.5 mg weekly (wednesdays): Appropriate, Query effective  -Medications previously tried: Jardiance (Yeast Infection) -Current home glucose readings 16-Oct 98  17-Oct 111  18-Oct 126  19-Oct 152  20-Oct 144  21-Oct 117  22-Oct 105  23-Oct 116  Average 121  -Reports hyperglycemic symptoms: polydipsia  -Current dietary Patterns: Cut out concentrated carbohydrates, eating more cooked vegetables. Drinks only coffee and unsweetened tea throughout the day.  -Patient previously had taken Jardiance, but stopped the medication due to a "bad" yeast infection. At the time, her A1c was 10-11%, putting her at a higher risk of UTIs. Now that her blood sugars are improving her chance of recurrent UTI may be lower. Patient was amenable to trying Wilder Glade to reduce of heart failure exacerbation and kidney disease. Counseled patient on importance of adequate hydration with water and proper hygiene to  decrease likelihood of recurrent infections.  -START Farxiga 10 mg daily   Anxiety / Insomnia (Goal: Maintain symptom remission) -Controlled: Not addressed during this visit. -Current treatment: Hydroxyzine 25 mg three times daily as needed: Appropriate, Effective, Safe, Accessible Sertraline 25 mg daily: Appropriate, Effective, Safe, Accessible  -Medications previously tried/failed: NA -GAD7: 8 -Enjoys puzzles, word searches  -Continue current medications   Osteoporosis / Osteopenia (Goal Prevent fractures) -Uncontrolled: Not addressed during this visit. -Patient is a candidate for pharmacologic treatment due to T-Score < -2.5 in femoral neck -Current treatment  Calcium-Vitamin D twice daily: Appropriate, Effective, Safe, Accessible  Ergocalciferol 50,000 units weekly: Appropriate, Effective, Safe, Accessible  -Medications previously tried: NA  -Recommended to continue current medication  GERD (Goal: Prevent reflux) -Controlled: Not addressed during this visit. -Current treatment  Omeprazole 40 mg daily  -Medications previously tried: NA  -Excellent symptom control, rebound symptoms if skipping a day.  -Recommended to continue current medication  Overactive Bladder (Goal: Minimize symptoms) -Controlled: Not addressed during this visit. -Current treatment  Oxybutynin XL 10 mg daily  -Medications previously tried: NA -Waking up 2-3 times nightly, much better controlled with oxybutynin.  -Will see if symptom changes as hyperglycemia improves.   -Recommended to continue current medication  Patient Goals/Self-Care Activities Patient will:  - check glucose daily before breakfast, document, and provide at future appointments check blood pressure 2-3 times weekly, document, and provide at future appointments weigh daily, and contact provider if weight gain of greater than 2 pounds in 24 hours  Follow Up Plan: Telephone follow up appointment with care management team member  scheduled for:  01/31/2022 at 10:00 AM  Medication Assistance:  Joni Reining obtained through [name] medication assistance program.  Enrollment ends Dec 2023  Compliance/Adherence/Medication fill history: Care Gaps: Tetanus/TDAP Dexa Scan Diabetic Foot Exam  Star-Rating Drugs: Onboarding to [facility]   Patient's preferred pharmacy is:  [facility], [address] [place] [address] Phone: [phone] Fax: [phone]  [facility] - [place], [place] - [address] [place] [address] Phone: [phone] Fax: [phone]  Uses pill box? Yes Pt endorses 100% compliance  Patient decided to: Utilize [facility] pharmacy for medication synchronization, packaging and delivery  Care Plan and Follow Up Patient Decision:  Patient agrees to Care Plan and Follow-up.  Plan: Telephone follow up appointment with care management team member scheduled for:  01/31/2022 at 10:00 AM  [name], PharmD, [facility] [place], [place] [phone]

## 2021-12-28 NOTE — Patient Instructions (Signed)
Visit Information It was great speaking with you today!  Please let me know if you have any questions about our visit.   Goals Addressed             This Visit's Progress    Monitor and Manage My Blood Sugar-Diabetes Type 2   On track    Timeframe:  Long-Range Goal Priority:  High Start Date: 06/23/2021                            Expected End Date: 07/07/2022                      Follow Up within 30 days   - check blood sugar once daily before breakfast - check blood sugar if I feel it is too high or too low - enter blood sugar readings and medication or insulin into daily log - take the blood sugar log to all doctor visits    Why is this important?   Checking your blood sugar at home helps to keep it from getting very high or very low.  Writing the results in a diary or log helps the doctor know how to care for you.  Your blood sugar log should have the time, date and the results.  Also, write down the amount of insulin or other medicine that you take.  Other information, like what you ate, exercise done and how you were feeling, will also be helpful.     Notes:         Patient Care Plan: General Pharmacy (Adult)     Problem Identified: Hypertension, Hyperlipidemia, Diabetes, Heart Failure, Coronary Artery Disease, GERD, COPD, Anxiety, Osteoporosis, and Insomnia, History of TIA   Priority: High     Long-Range Goal: Patient-Specific Goal   Start Date: 06/23/2021  Expected End Date: 07/07/2022  This Visit's Progress: On track  Recent Progress: On track  Priority: High  Note:   Current Barriers:  Unable to independently afford treatment regimen Unable to achieve control of diabetes   Pharmacist Clinical Goal(s):  Patient will verbalize ability to afford treatment regimen achieve control of diabetes as evidenced by A1c less than 8% through collaboration with PharmD and provider.   Interventions: 1:1 collaboration with [name], FNP regarding development and  update of comprehensive plan of care as evidenced by provider attestation and co-signature Inter-disciplinary care team collaboration (see longitudinal plan of care) Comprehensive medication review performed; medication list updated in electronic medical record  Heart Failure (Goal: manage symptoms and prevent exacerbations) -Controlled: Not addressed during this visit. -Last ejection fraction: 40-45% (Date: Jan 2023) -HF type: Systolic -NYHA Class: III (marked limitation of activity) -Current treatment: Furosemide 20 mg daily: Appropriate, Effective, Safe, Accessible Losartan 25 mg daily: Appropriate, Effective, Safe, Accessible  Metoprolol XL 50 mg daily: Appropriate, Effective, Safe, Accessible  -Medications previously tried: NA  -Only taking metoprolol 25 mg daily due to not understanding instructions.  -Current home BP/HR readings: Not monitoring -Will change metoprolol to 50 mg daily for simplicity and improved adherence  Hyperlipidemia: (LDL goal < 55) -Controlled -History of PAD, CAD, TIA -Current treatment: Atorvastatin 20 mg daily: Appropriate, Effective, Safe, Accessible  -Medications previously tried: NA  -Recommended to continue current medication  Diabetes (A1c goal <8%) -Uncontrolled -Current medications: Tresiba 32 units daily (0.38 u/kg): Appropriate, Query effective  Ozempic 0.5 mg weekly (wednesdays): Appropriate, Query effective  -Medications previously tried: Jardiance (Yeast Infection) -Current home  glucose readings 16-Oct 98  17-Oct 111  18-Oct 126  19-Oct 152  20-Oct 144  21-Oct 117  22-Oct 105  23-Oct 116  Average 121  -Reports hyperglycemic symptoms: polydipsia  -Current dietary Patterns: Cut out concentrated carbohydrates, eating more cooked vegetables. Drinks only coffee and unsweetened tea throughout the day.  -Patient previously had taken Jardiance, but stopped the medication due to a "bad" yeast infection. At the time, her A1c was 10-11%,  putting her at a higher risk of UTIs. Now that her blood sugars are improving her chance of recurrent UTI may be lower. Patient was amenable to trying [place] to reduce of heart failure exacerbation and kidney disease. Counseled patient on importance of adequate hydration with water and proper hygiene to decrease likelihood of recurrent infections.  -START Farxiga 10 mg daily   Anxiety / Insomnia (Goal: Maintain symptom remission) -Controlled: Not addressed during this visit. -Current treatment: Hydroxyzine 25 mg three times daily as needed: Appropriate, Effective, Safe, Accessible Sertraline 25 mg daily: Appropriate, Effective, Safe, Accessible  -Medications previously tried/failed: NA -GAD7: 8 -Enjoys puzzles, word searches  -Continue current medications   Osteoporosis / Osteopenia (Goal Prevent fractures) -Uncontrolled: Not addressed during this visit. -Patient is a candidate for pharmacologic treatment due to T-Score < -2.5 in femoral neck -Current treatment  Calcium-Vitamin D twice daily: Appropriate, Effective, Safe, Accessible  Ergocalciferol 50,000 units weekly: Appropriate, Effective, Safe, Accessible  -Medications previously tried: NA  -Recommended to continue current medication  GERD (Goal: Prevent reflux) -Controlled: Not addressed during this visit. -Current treatment  Omeprazole 40 mg daily  -Medications previously tried: NA  -Excellent symptom control, rebound symptoms if skipping a day.  -Recommended to continue current medication  Overactive Bladder (Goal: Minimize symptoms) -Controlled: Not addressed during this visit. -Current treatment  Oxybutynin XL 10 mg daily  -Medications previously tried: NA -Waking up 2-3 times nightly, much better controlled with oxybutynin.  -Will see if symptom changes as hyperglycemia improves.   -Recommended to continue current medication  Patient Goals/Self-Care Activities Patient will:  - check glucose daily before breakfast,  document, and provide at future appointments check blood pressure 2-3 times weekly, document, and provide at future appointments weigh daily, and contact provider if weight gain of greater than 2 pounds in 24 hours  Follow Up Plan: Telephone follow up appointment with care management team member scheduled for:  01/31/2022 at 10:00 AM    Patient agreed to services and verbal consent obtained.   Patient verbalizes understanding of instructions and care plan provided today and agrees to view in [place]. Active MyChart status and patient understanding of how to access instructions and care plan via MyChart confirmed with patient.     [name], PharmD, Para [place], CPP  Clinical Pharmacist Practitioner  [facility] [phone]

## 2022-01-01 MED ORDER — ONETOUCH DELICA LANCETS 33G MISC: 5 refills | Status: DC

## 2022-01-01 NOTE — Addendum Note (Signed)
Addended by: Daron Offer A on: 01/01/2022 10:31 AM   Modules accepted: Orders

## 2022-01-04 ENCOUNTER — Encounter (INDEPENDENT_AMBULATORY_CARE_PROVIDER_SITE_OTHER): Payer: Self-pay

## 2022-01-04 DIAGNOSIS — Z794 Long term (current) use of insulin: Secondary | ICD-10-CM

## 2022-01-05 ENCOUNTER — Other Ambulatory Visit: Payer: Self-pay | Admitting: Family Medicine

## 2022-01-05 DIAGNOSIS — F419 Anxiety disorder, unspecified: Secondary | ICD-10-CM

## 2022-01-05 DIAGNOSIS — M810 Age-related osteoporosis without current pathological fracture: Secondary | ICD-10-CM | POA: Diagnosis not present

## 2022-01-05 DIAGNOSIS — I5020 Unspecified systolic (congestive) heart failure: Secondary | ICD-10-CM | POA: Diagnosis not present

## 2022-01-05 DIAGNOSIS — E785 Hyperlipidemia, unspecified: Secondary | ICD-10-CM

## 2022-01-11 ENCOUNTER — Other Ambulatory Visit: Payer: Self-pay | Admitting: Family Medicine

## 2022-01-11 DIAGNOSIS — I5022 Chronic systolic (congestive) heart failure: Secondary | ICD-10-CM

## 2022-01-15 NOTE — Telephone Encounter (Signed)
-----   Message from [name], FNP sent at 01/13/2022  4:51 PM EST ----- Regarding: FW: CCM Referral OK for orders ----- Message ----- From: Neldon Labella, RN Sent: 01/11/2022  10:37 AM EST To: [name], FNP Subject: CCM Referral                                   Good Morning [name],  Andrea [name] is currently being followed for: Chronic Condition 1: CHF Chronic Condition 2: HTN Chronic Condition 2: HLD Chronic Condition 2: DM   We can continue with the following goals:  Goal: CCM (CHF) EXPECTED OUTCOME:  MONITOR, SELF- MANAGE AND REDUCE SYMPTOMS OF CONGESTIVE HEART FAILURE  Goal: CCM (HYPERTENSION) EXPECTED OUTCOME:  MONITOR, SELF- MANAGE AND REDUCE SYMPTOMS OF HYPERTENSION  Goal: CCM (HYPERLIPIDEMIA) EXPECTED OUTCOME:  MONITOR, SELF- MANAGE AND REDUCE SYMPTOMS OF HYPERLIPIDEMIA  Goal: CCM (DIABETES) EXPECTED OUTCOME:  MONITOR, SELF- MANAGE AND REDUCE SYMPTOMS OF DIABETES  Thank You!

## 2022-01-18 ENCOUNTER — Telehealth: Payer: Self-pay

## 2022-01-18 NOTE — Telephone Encounter (Signed)
   CCM RN Visit Note   01/18/22 Name: SHWETA AMAN MRN: [id number]      DOB: May 02, 1945  Subjective: Andrea [name] is a 76 y.o. year old female who is a primary care patient of [name], FNP. The patient was referred to the Chronic Care Management team for assistance with care management needs subsequent to provider initiation of CCM services and plan of care.      An unsuccessful telephone outreach was attempted today to contact the patient about Chronic Care Management needs.    PLAN: A HIPAA compliant phone message was left for the patient providing contact information and requesting a return call.   Horris Latino RN Care Manager/Chronic Care Management [phone]

## 2022-01-20 ENCOUNTER — Other Ambulatory Visit: Payer: Self-pay | Admitting: Family Medicine

## 2022-01-20 ENCOUNTER — Telehealth: Payer: Self-pay

## 2022-01-20 DIAGNOSIS — E1142 Type 2 diabetes mellitus with diabetic polyneuropathy: Secondary | ICD-10-CM

## 2022-01-20 NOTE — Progress Notes (Signed)
Chronic Care Management Pharmacy Assistant   Name: Andrea [name]  MRN: [id number] DOB: 1946/02/19  Reason for Encounter: Medication Review/Medication Coordination for [facility]   Recent office visits:  None ID  Recent consult visits:  None ID  Hospital visits:  None in previous 6 months  Medications: Outpatient Encounter Medications as of 01/20/2022  Medication Sig   Alcohol Swabs (ALCOHOL PADS) 70 % PADS 1 each by Does not apply route in the morning and at bedtime.   atorvastatin (LIPITOR) 20 MG tablet Take 1 tablet (20 mg total) by mouth at bedtime.   Calcium Carbonate-Vitamin D 600-400 MG-UNIT tablet Take 1 tablet by mouth daily.   clobetasol (TEMOVATE) 0.05 % external solution Apply 1 application. topically 2 (two) times daily.   Cyanocobalamin (B-12) 1000 MCG CAPS Take 1,000 mcg by mouth daily.   dapagliflozin propanediol (FARXIGA) 10 MG TABS tablet Take 1 tablet (10 mg total) by mouth daily before breakfast.   furosemide (LASIX) 20 MG tablet Take 1 tablet (20 mg total) by mouth daily.   hydrOXYzine (ATARAX) 25 MG tablet TAKE 1 TABLET BY MOUTH THREE TIMES A DAY AS NEEDED   insulin degludec (TRESIBA FLEXTOUCH) 100 UNIT/ML FlexTouch Pen Inject 32 Units into the skin at bedtime. Patient receives via [name] Patient Assistance through December 2023   Insulin Pen Needle (TRUEPLUS PEN NEEDLES) 31G X 6 MM MISC Use with lantus two times daily.   INSULIN SYRINGE 1CC/29G 29G X 1/2" 1 ML MISC For lantus injections twice daily   losartan (COZAAR) 25 MG tablet Take 1 tablet (25 mg total) by mouth daily.   metoprolol succinate (TOPROL-XL) 50 MG 24 hr tablet Take 1 tablet (50 mg total) by mouth daily.   montelukast (SINGULAIR) 10 MG tablet Take 1 tablet (10 mg total) by mouth at bedtime.   Multiple Vitamins-Minerals (PRESERVISION AREDS 2 PO) Take 1 tablet by mouth in the morning and at bedtime.   Omega-3 1000 MG CAPS Take 1,000 mg by mouth daily.   omeprazole (PRILOSEC)  40 MG capsule Take 1 capsule (40 mg total) by mouth at bedtime.   OneTouch Delica Lancets 10C MISC To check blood sugar daily  DX: E11.9   ONETOUCH VERIO test strip TO CHECK BLOOD SUGAR ONCE DAILY.   oxybutynin (DITROPAN-XL) 10 MG 24 hr tablet Take 1 tablet (10 mg total) by mouth at bedtime.   Semaglutide,0.25 or 0.'5MG'$ /DOS, (OZEMPIC, 0.25 OR 0.5 MG/DOSE,) 2 MG/3ML SOPN Inject 0.5 mg into the skin once a week. Patient receives via [name] Patient Assistance through December 2023   sertraline (ZOLOFT) 25 MG tablet Take 1 tablet (25 mg total) by mouth daily.   TRULICITY 1.5 HE/5.2DP SOPN SMARTSIG:1 pre-filled pen syringe SUB-Q Once a Week   Vitamin D, Ergocalciferol, (DRISDOL) 1.25 MG (50000 UNIT) CAPS capsule Take 1 capsule (50,000 Units total) by mouth every 7 (seven) days.   No facility-administered encounter medications on file as of 01/20/2022.   Care Gaps: Tetanus/TDAP Lung Cancer Screening Dexa Scan Diabetic Foot Exam  Star Rating Drugs: Atorvastatin 20 mg last filled on 11/03/2021 for a 90-Day supply with [facility] Farxiga 10 mg last filled on 12/28/2021 for a 30-Day supply with [facility] Losartan 25 mg last filled on 01/08/2022 for a 21-Day supply with [facility] Ozempic 0.5 mg patient receives this medication via [name] Patient Assistance Program  BP Readings from Last 3 Encounters:  12/10/21 (!) 127/56  09/03/21 101/63  06/02/21 115/71    Lab Results  Component Value Date   HGBA1C 8.6 (H) 12/10/2021   Patient obtains medications through Adherence Packaging  30 Days   Last adherence delivery included:  This is the first time I am completing the medication coordination for this patient so I am unsure what she received last month.  Patient declined medications for the month of October: This is the 1st Med sync for this patient. I am unsure if any medications were declined last month  Patient is due for next adherence delivery on: 02/02/2022  2nd Route.  Called patient and reviewed medications and coordinated delivery.  This delivery to include: Atorvastatin 20 mg 1 tablet daily (Bedtime) Furosemide 20 mg 1 tablet daily (Breakfast) Losartan 25 mg 1 tablet daily (Breakfast) Metoprolol XL 1 tablet daily (Breakfast) Omeprazole  40 mg 1 tablet daily (Breakfast) Oxybutynin XL 10 mg 1 tablet daily (Bedtime) Montelukast 10 mg 1 tablet daily (Bedtime) Vitamin D 50,000 U 1 tablet weekly (on Fridays) Sertraline 25 mg 1 tablet daily (Breakfast)   Patient declined the following medications for the month of November: Patient was not spoken to by me  Patient needs refills for: Farxiga 10 mg. CPP sent script to pharmacy for patient to try for 30-Days. If agreeable we will do PAP for this medication via AZ&ME  I wasn't able to confirm the delivery date of 02/02/2022 2nd Route as patient never answered her phone nor returned my calls. I wasn't able to LVM for the patient as her VM isn't setup.  Patient has a follow-up appointment with [name], CPP on 02/02/2022 @ 1000.  11/15 Tried calling patient but I am unable to LVM as her VM hasn't been setup for messages at this time  11/16 Unable to reach patient to completed Medication Coordination form. Form was completed based on last month delivery. [facility] will contact patient to confirm delivery.[name], CPP was notified I was unable to reach patient  Lynann Bologna, [place] Pharmacist Assistant Phone: [phone]

## 2022-01-26 ENCOUNTER — Telehealth: Payer: Self-pay

## 2022-01-26 NOTE — Progress Notes (Signed)
Chronic Care Management Pharmacy Assistant   Name: Andrea [name]  MRN: [id number] DOB: 04/18/1945  Reason for Encounter: Patient Assistance Application for [place]   Patient was recently prescribed Farxiga and is unable to afford this medication due to high copayment. I did complete application for Farxiga through the AZ&ME program but the patient was denied due to [facility] Eligible.   CPP has been notified regarding decision. CPP stated that the patient probably is eligible for the Low Income subsidy program, but that program takes months to get approved for so I am inquiring about what he would like to do in the mean time.  I contacted AZ[employer]ME and they stated according to the patient's income she should qualify for Medicaid. CPP has been informed that patient does need a Education officer, museum referral so they can assist her with Medicaid.   Medications: Outpatient Encounter Medications as of 01/26/2022  Medication Sig   Alcohol Swabs (ALCOHOL PADS) 70 % PADS 1 each by Does not apply route in the morning and at bedtime.   atorvastatin (LIPITOR) 20 MG tablet Take 1 tablet (20 mg total) by mouth at bedtime.   Calcium Carbonate-Vitamin D 600-400 MG-UNIT tablet Take 1 tablet by mouth daily.   clobetasol (TEMOVATE) 0.05 % external solution Apply 1 application. topically 2 (two) times daily.   Cyanocobalamin (B-12) 1000 MCG CAPS Take 1,000 mcg by mouth daily.   FARXIGA 10 MG TABS tablet TAKE ONE TABLET BY MOUTH ONCE DAILY BEFORE BREAKFAST   furosemide (LASIX) 20 MG tablet Take 1 tablet (20 mg total) by mouth daily.   hydrOXYzine (ATARAX) 25 MG tablet TAKE 1 TABLET BY MOUTH THREE TIMES A DAY AS NEEDED   insulin degludec (TRESIBA FLEXTOUCH) 100 UNIT/ML FlexTouch Pen Inject 32 Units into the skin at bedtime. Patient receives via [name] Patient Assistance through December 2023   Insulin Pen Needle (TRUEPLUS PEN NEEDLES) 31G X 6 MM MISC Use with lantus two times daily.   INSULIN SYRINGE  1CC/29G 29G X 1/2" 1 ML MISC For lantus injections twice daily   losartan (COZAAR) 25 MG tablet Take 1 tablet (25 mg total) by mouth daily.   metoprolol succinate (TOPROL-XL) 50 MG 24 hr tablet Take 1 tablet (50 mg total) by mouth daily.   montelukast (SINGULAIR) 10 MG tablet Take 1 tablet (10 mg total) by mouth at bedtime.   Multiple Vitamins-Minerals (PRESERVISION AREDS 2 PO) Take 1 tablet by mouth in the morning and at bedtime.   Omega-3 1000 MG CAPS Take 1,000 mg by mouth daily.   omeprazole (PRILOSEC) 40 MG capsule Take 1 capsule (40 mg total) by mouth at bedtime.   OneTouch Delica Lancets 67T MISC To check blood sugar daily  DX: E11.9   ONETOUCH VERIO test strip TO CHECK BLOOD SUGAR ONCE DAILY.   oxybutynin (DITROPAN-XL) 10 MG 24 hr tablet Take 1 tablet (10 mg total) by mouth at bedtime.   Semaglutide,0.25 or 0.'5MG'$ /DOS, (OZEMPIC, 0.25 OR 0.5 MG/DOSE,) 2 MG/3ML SOPN Inject 0.5 mg into the skin once a week. Patient receives via [name] Patient Assistance through December 2023   sertraline (ZOLOFT) 25 MG tablet Take 1 tablet (25 mg total) by mouth daily.   TRULICITY 1.5 IW/5.8KD SOPN SMARTSIG:1 pre-filled pen syringe SUB-Q Once a Week   Vitamin D, Ergocalciferol, (DRISDOL) 1.25 MG (50000 UNIT) CAPS capsule Take 1 capsule (50,000 Units total) by mouth every 7 (seven) days.   No facility-administered encounter medications on file as of 01/26/2022.  Lynann Bologna, CPA/CMA Clinical Pharmacist Assistant Phone: [phone]

## 2022-01-27 ENCOUNTER — Telehealth: Payer: Self-pay | Admitting: *Deleted

## 2022-01-27 NOTE — Telephone Encounter (Signed)
Please advise 

## 2022-01-27 NOTE — Telephone Encounter (Signed)
Called patient to let her know we set aside farxiga samples for her at the front desk. Okay for PEC to advise.

## 2022-01-27 NOTE — Telephone Encounter (Signed)
Call pt; 6 wks set aside

## 2022-01-27 NOTE — Telephone Encounter (Signed)
Copied from [place] [phone]. Topic: General - Inquiry >> Jan 26, 2022 12:11 PM Ludger Nutting wrote: Andrea [name]'s assistant called to see if we had any samples of FARXIGA 10 MG TABS tablet to provide patient until her financial assistance is approved.

## 2022-01-29 ENCOUNTER — Other Ambulatory Visit: Payer: Self-pay | Admitting: Family Medicine

## 2022-01-29 DIAGNOSIS — I5022 Chronic systolic (congestive) heart failure: Secondary | ICD-10-CM

## 2022-01-29 NOTE — Telephone Encounter (Signed)
Refilled 11/13/2021 #90 1 rf. Requested Prescriptions  Pending Prescriptions Disp Refills   losartan (COZAAR) 25 MG tablet [Pharmacy Med Name: LOSARTAN POTASSIUM 25 MG TAB] 90 tablet 1    Sig: TAKE 1 TABLET BY MOUTH EVERY DAY     Cardiovascular:  Angiotensin Receptor Blockers Failed - 01/29/2022  2:12 AM      Failed - Cr in normal range and within 180 days    Creat  Date Value Ref Range Status  12/16/2016 1.08 (H) 0.60 - 0.93 mg/dL Final    Comment:    For patients >37 years of age, the reference limit for Creatinine is approximately 13% higher for people identified as African-American. .    Creatinine, Ser  Date Value Ref Range Status  12/10/2021 1.18 (H) 0.57 - 1.00 mg/dL Final         Passed - K in normal range and within 180 days    Potassium  Date Value Ref Range Status  12/10/2021 5.1 3.5 - 5.2 mmol/L Final  01/01/2014 4.2 3.5 - 5.1 mmol/L Final         Passed - Patient is not pregnant      Passed - Last BP in normal range    BP Readings from Last 1 Encounters:  12/10/21 (!) 127/56         Passed - Valid encounter within last 6 months    Recent Outpatient Visits           1 month ago Type 2 diabetes mellitus with hyperlipidemia [facility])   [facility] [name] T, FNP   4 months ago 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 [facility])   [facility] [name] T, FNP   8 months ago Type 2 diabetes mellitus with hyperlipidemia [facility])   [facility] [name] T, FNP   10 months ago Chronic heel pain, left   [facility] [name] T, FNP   11 months ago 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 edema, with long-term current use of insulin [facility])   [facility] [name], FNP       Future Appointments             In 1 month Rollene Rotunda, Jaci Standard, [place], [place]

## 2022-02-02 ENCOUNTER — Ambulatory Visit (INDEPENDENT_AMBULATORY_CARE_PROVIDER_SITE_OTHER): Payer: Medicare Other

## 2022-02-02 ENCOUNTER — Telehealth: Payer: Medicare Other

## 2022-02-02 DIAGNOSIS — E1142 Type 2 diabetes mellitus with diabetic polyneuropathy: Secondary | ICD-10-CM

## 2022-02-02 DIAGNOSIS — I5022 Chronic systolic (congestive) heart failure: Secondary | ICD-10-CM

## 2022-02-02 NOTE — Progress Notes (Signed)
Chronic Care Management Pharmacy Note  02/02/2022 Name:  Andrea [name] MRN:  [id number] DOB:  23-Jun-1945  Summary: Patient presents for CCM follow-up.   Recommendations/Changes made from today's visit:   Plan:   Recommended Problem List Changes:  Add: Chronic Kidney Disease Stage 3a   Subjective: Andrea [name] is an 76 y.o. year old female who is a primary patient of [name], FNP.  The CCM team was consulted for assistance with disease management and care coordination needs.    Engaged with patient by telephone for follow up visit in response to provider referral for pharmacy case management and/or care coordination services.   Consent to Services:  The patient was given information about Chronic Care Management services, agreed to services, and gave verbal consent prior to initiation of services.  Please see initial visit note for detailed documentation.   Patient Care Team: [name], FNP as PCP - General (Family Medicine) Minna Merritts, MD as PCP - Cardiology (Cardiology) Estill Cotta, MD as Consulting Physician (Ophthalmology) Newman Pies, MD as Consulting Physician (Neurosurgery) Lucky Cowboy Erskine Squibb, MD as Referring Physician (Vascular Surgery) [name], MD as Consulting Physician (Gastroenterology) Germaine Pomfret, [facility] (Pharmacist)  Recent office visits: 12/10/21: Patient presented to [name], FNP for follow-up. A1c 8.6%.   09/03/21: Patient presented to [name], FNP for follow-up. A1c 10.4%.   06/02/2021 [name], FNP (PCP Office Visit) for Type II DM- Stopped: Amoxicillin due to patient not taking, Started: Clobetasol Propionate 2.91% 1 application daily, Lab orders placed, Referral for Dermatology placed, Patient to follow-up in 4 months   Recent consult visits: 05/26/2021 Daylene Katayama, DPM (Podiatry) for Diabetic Ulcer- No medication changes noted, No orders placed, Patient to follow-up as needed   04/28/2021 Ida Rogue, MD (Cardiology) for 1 year follow-up- Changed: Metoprolol Succinate 25 mg 1 tablet daily to twice daily, EKG 12-Lead ordered, Patient to follow-up in 1 year   04/21/2021 Daylene Katayama, DPM (Podiatry) for Diabetic Ulcer- No medication changes noted, DG Foot Complete Left ordered, Patient to follow-up in 4 weeks   03/24/2021 Daylene Katayama, DPM (Podiatry) for Wound Check-No medication changes noted, DG Foot Complete Left ordered, Patient to follow-up in 4 weeks   Hospital visits: None in previous 6 months   Objective:  Lab Results  Component Value Date   CREATININE 1.18 (H) 12/10/2021   BUN 30 (H) 12/10/2021   EGFR 48 (L) 12/10/2021   GFRNONAA 46 (L) 12/22/2020   GFRAA 70 02/11/2020   NA 139 12/10/2021   K 5.1 12/10/2021   CALCIUM 9.7 12/10/2021   CO2 21 12/10/2021   GLUCOSE 144 (H) 12/10/2021    Lab Results  Component Value Date/Time   HGBA1C 8.6 (H) 12/10/2021 11:37 AM   HGBA1C 10.4 (A) 09/03/2021 02:21 PM   HGBA1C 11.3 06/03/2021 04:44 PM   HGBA1C 11.3 (A) 06/02/2021 01:59 PM   HGBA1C 11.3 (H) 06/02/2021 12:00 AM   HGBA1C 7.5 (H) 11/26/2013 04:14 AM   MICROALBUR 20 12/16/2016 03:10 PM    Last diabetic Eye exam:  Lab Results  Component Value Date/Time   HMDIABEYEEXA No Retinopathy 04/08/2021 12:00 AM    Last diabetic Foot exam: No results found for: "HMDIABFOOTEX"   Lab Results  Component Value Date   CHOL 132 12/10/2021   HDL 58 12/10/2021   LDLCALC 45 12/10/2021   TRIG 180 (H) 12/10/2021   CHOLHDL 2.3 12/10/2021       Latest Ref Rng & Units 12/10/2021  11:37 AM 06/03/2021    4:44 PM 06/02/2021   12:00 AM  Hepatic Function  Total Protein 6.0 - 8.5 g/dL 6.7   6.7   Albumin 3.8 - 4.8 g/dL 4.3  4.2     4.2   AST 0 - 40 IU/L _0 ALT 0 - 32 IU/L _1 Alk Phosphatase 44 - 121 IU/L 132   144   Total Bilirubin 0.0 - 1.2 mg/dL 0.6   0.6      This result is from an external source.    Lab Results  Component Value Date/Time   TSH 1.88  06/03/2021 04:44 PM   TSH 1.880 06/02/2021 12:00 AM   TSH 1.220 12/28/2018 04:26 PM   FREET4 1.33 06/02/2021 12:00 AM       Latest Ref Rng & Units 12/20/2020    4:49 AM 12/19/2020    5:45 AM 12/18/2020   10:22 AM  CBC  WBC 4.0 - 10.5 K/uL 7.6  7.0  9.5   Hemoglobin 12.0 - 15.0 g/dL 10.5  10.4  11.4   Hematocrit 36.0 - 46.0 % 32.7  31.8  35.1   Platelets 150 - 400 K/uL 211  189  188     No results found for: "VD25OH"  Clinical ASCVD: Yes  The ASCVD Risk score (Arnett DK, et al., 2019) failed to calculate for the following reasons:   The patient has a prior MI or stroke diagnosis       12/10/2021   11:21 AM 06/30/2021    1:04 PM 11/17/2020    4:03 PM  Depression screen PHQ 2/9  Decreased Interest 0 0 0  Down, Depressed, Hopeless 0 0 0  PHQ - 2 Score 0 0 0  Altered sleeping 0    Tired, decreased energy 0    Change in appetite 0    Feeling bad or failure about yourself  0    Trouble concentrating 0    Moving slowly or fidgety/restless 0    Suicidal thoughts 0    PHQ-9 Score 0    Difficult doing work/chores Not difficult at all      -Last DEXA Scan: 03/07/18   T-Score femoral neck: -3.0  T-Score total hip: NA  T-Score lumbar spine: -0.4  T-Score forearm radius: -4.0  Social History   Tobacco Use  Smoking Status Former   Packs/day: 1.00   Years: 30.00   Total pack years: 30.00   Types: Cigarettes   Quit date: 07/17/2012   Years since quitting: 9.5  Smokeless Tobacco Never  Tobacco Comments       BP Readings from Last 3 Encounters:  12/10/21 (!) 127/56  09/03/21 101/63  06/02/21 115/71   Pulse Readings from Last 3 Encounters:  12/10/21 91  09/03/21 90  06/02/21 91   Wt Readings from Last 3 Encounters:  12/10/21 141 lb (64 kg)  09/03/21 143 lb 8 oz (65.1 kg)  06/30/21 137 lb (62.1 kg)   BMI Readings from Last 3 Encounters:  12/10/21 30.51 kg/m  09/03/21 28.03 kg/m  06/30/21 26.76 kg/m    Assessment/Interventions: Review of patient past  medical history, allergies, medications, health status, including review of consultants reports, laboratory and other test data, was performed as part of comprehensive evaluation and provision of chronic care management services.   SDOH:  (Social Determinants of Health) assessments and interventions performed: Yes SDOH Interventions    Flowsheet  Row Chronic Care Management from 06/23/2021 in [facility] Most recent reading at 07/06/2021 10:27 AM Clinical Support from 06/30/2021 in [facility] Most recent reading at 06/30/2021  1:05 PM Chronic Care Management from 11/17/2020 in [facility] Most recent reading at 11/17/2020  3:05 PM Office Visit from 12/26/2019 in [facility] Most recent reading at 12/26/2019 11:13 AM  SDOH Interventions      Food Insecurity Interventions -- Intervention Not Indicated Intervention Not Indicated --  Housing Interventions -- Intervention Not Indicated -- --  Transportation Interventions Intervention Not Indicated Intervention Not Indicated [id number] Referral --  Depression Interventions/Treatment  -- -- -- Medication  Financial Strain Interventions Other (Comment)  [PAP] Intervention Not Indicated -- --  Physical Activity Interventions -- Intervention Not Indicated -- --  Stress Interventions -- Intervention Not Indicated -- --  Social Connections Interventions -- Intervention Not Indicated -- --       SDOH Screenings   Food Insecurity: No Food Insecurity (06/30/2021)  Housing: Low Risk  (06/30/2021)  Transportation Needs: No Transportation Needs (07/06/2021)  Alcohol Screen: Low Risk  (12/10/2021)  Depression (PHQ2-9): Low Risk  (12/10/2021)  Financial Resource Strain: High Risk (07/06/2021)  Physical Activity: Sufficiently Active (06/30/2021)  Social Connections: Moderately Isolated (06/30/2021)  Stress: No Stress Concern Present (06/30/2021)  Tobacco Use: Medium Risk (12/10/2021)    CCM Care  Plan  Allergies  Allergen Reactions   Chlorhexidine    Metformin And Related Diarrhea   Sulfa Antibiotics Rash    Medications Reviewed Today     Reviewed by [name], FNP (Family Nurse Practitioner) on 12/10/21 at 1510  Med List Status: <None>   Medication Order Taking? Sig Documenting Provider Last Dose Status Informant  Alcohol Swabs (ALCOHOL PADS) 70 % PADS [id number] Yes 1 each by Does not apply route in the morning and at bedtime. [name], FNP Taking Active   atorvastatin (LIPITOR) 20 MG tablet [id number] Yes Take 1 tablet (20 mg total) by mouth at bedtime. [name], FNP Taking Active   Calcium Carbonate-Vitamin D 600-400 MG-UNIT tablet [id number] Yes Take 1 tablet by mouth daily. [provider] Taking Active Nursing Home Medication Administration Guide (MAG)  clobetasol (TEMOVATE) 0.05 % external solution [id number] Yes Apply 1 application. topically 2 (two) times daily. [name], FNP Taking Active   Cyanocobalamin (B-12) 1000 MCG CAPS [id number] Yes Take 1,000 mcg by mouth daily. [provider] Taking Active Nursing Home Medication Administration Guide (MAG)  furosemide (LASIX) 20 MG tablet [id number] Yes Take 1 tablet (20 mg total) by mouth daily. [name], FNP Taking Active   hydrOXYzine (ATARAX) 25 MG tablet [id number] Yes TAKE 1 TABLET BY MOUTH THREE TIMES A DAY AS NEEDED [name], FNP Taking Active   insulin degludec (TRESIBA FLEXTOUCH) 100 UNIT/ML FlexTouch Pen [id number] Yes Inject 32 Units into the skin at bedtime. Patient receives via [name] Patient Assistance through December 2023 [name] T, FNP Taking Active   Insulin Pen Needle (TRUEPLUS PEN NEEDLES) 31G X 6 MM MISC [id number] Yes Use with lantus two times daily. Virginia Crews, MD Taking Active Nursing Home Medication Administration Guide (MAG)  INSULIN SYRINGE 1CC/29G 29G X 1/2" 1 ML MISC [id number] Yes For lantus injections twice daily Mar Daring,  PA-C Taking Active Nursing Home Medication Administration Guide (MAG)  losartan (COZAAR) 25 MG tablet [id number] Yes Take 1 tablet (25 mg total) by mouth daily. [name], FNP Taking Active  metoprolol succinate (TOPROL-XL) 50 MG 24 hr tablet [id number] Yes Take 1 tablet (50 mg total) by mouth daily. [name], FNP Taking Active   montelukast (SINGULAIR) 10 MG tablet [id number] Yes Take 1 tablet (10 mg total) by mouth at bedtime. [name], FNP  Active   Multiple Vitamins-Minerals (PRESERVISION AREDS 2 PO) [id number] Yes Take 1 tablet by mouth in the morning and at bedtime. [provider] Taking Active Nursing Home Medication Administration Guide (MAG)  Omega-3 1000 MG CAPS [id number] Yes Take 1,000 mg by mouth daily. [provider] Taking Active Nursing Home Medication Administration Guide (MAG)  omeprazole (PRILOSEC) 40 MG capsule [id number] Yes Take 1 capsule (40 mg total) by mouth at bedtime. [name], FNP Taking Active   [facility] Lancets [address] [place] [id number] Yes To check blood sugar daily  DX: E11.9 [name], FNP Taking Active   [facility] VERIO test strip [id number] Yes TO CHECK BLOOD SUGAR ONCE DAILY. [name], FNP Taking Active   oxybutynin (DITROPAN-XL) 10 MG 24 hr tablet [id number] Yes Take 1 tablet (10 mg total) by mouth at bedtime. [name], FNP Taking Active   Semaglutide,0.25 or 0.5MG/DOS, (OZEMPIC, 0.25 OR 0.5 MG/DOSE,) 2 MG/3ML SOPN [id number] Yes Inject 0.5 mg into the skin once a week. Patient receives via [name] Patient Assistance through December 2023 [name], FNP Taking Active            Med Note Michaelle Birks, [place] Evora A   Fri Nov 13, 2021  9:36 AM)    sertraline (ZOLOFT) 25 MG tablet [id number] Yes Take 1 tablet (25 mg total) by mouth daily. [name], FNP Taking Active   TRULICITY 1.5 CL/2.7NT Bonney Aid [id number] Yes SMARTSIG:1 pre-filled pen syringe SUB-Q Once a Week [provider] Taking Active   Vitamin D,  Ergocalciferol, (DRISDOL) 1.25 MG (50000 UNIT) CAPS capsule [id number] Yes Take 1 capsule (50,000 Units total) by mouth every 7 (seven) days. [name], FNP Taking Active   Med List Note Loann Quill, CPhT 12/18/20 1047): [employer] [phone]            Patient Active Problem List   Diagnosis Date Noted   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 ([place]) 12/10/2021   Type 2 diabetes mellitus with diabetic polyneuropathy, without long-term current use of insulin ([place]) 12/10/2021   Need for influenza vaccination 12/10/2021   Gastroesophageal reflux disease without esophagitis 11/13/2021   OAB (overactive bladder) 11/13/2021   Anxiety 11/13/2021   Vitamin D deficiency 11/13/2021   Dependent on wheelchair 09/03/2021   Impaired mobility 09/03/2021   Hypertension associated with diabetes ([place]) 09/03/2021   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 ([place]) 09/03/2021   Need for pneumococcal vaccination 06/02/2021   Hair loss 06/02/2021   Hyperlipidemia LDL goal <70 04/28/2021   Chronic heel pain, left 04/02/2021   MRSA (methicillin resistant staph aureus) culture positive 02/05/2021   Hospital discharge follow-up 01/23/2021   Weight loss, unintentional 01/23/2021   Primary insomnia 01/23/2021   Loss of appetite for more than 2 weeks 01/23/2021   Hypomagnesemia    Hypokalemia    Essential hypertension    Tachycardia    Depression    Acute metabolic encephalopathy 17/79/3903   Acute kidney injury superimposed on CKD ([place]) 12/18/2020   Delirium 00/92/3300   Chronic systolic CHF (congestive heart failure) ([place]) 12/18/2020   Osteomyelitis of foot, left, acute ([place]) 12/18/2020   Status post peripherally inserted central  catheter (PICC) central line placement    Osteomyelitis ([place]) 11/17/2020   Cellulitis of left lower extremity 11/13/2020   Hypoglycemia 11/13/2020   Non healing left heel wound 11/13/2020   Status post total hip replacement, left  08/19/2020   Pain due to onychomycosis of toenail of left foot 03/06/2020   Malaise and fatigue 12/28/2019   UTI (urinary tract infection) 12/28/2019   Hyponatremia 12/27/2019   Intertrigo 12/27/2019   History of meningitis 11/23/2019   CSF leak from nose 11/22/2019   History of myocardial infarction 03/09/2019   PAD (peripheral artery disease) ([place]) 12/28/2018   Leg length discrepancy 09/21/2017   Coronary artery disease involving native coronary artery 01/06/2017   Drug-induced constipation 01/06/2017   Spinal stenosis of lumbar region 01/06/2017   Spondylolisthesis of lumbar region 12/30/2016   Obese 04/16/2015   History of colon polyps 11/23/2012   Bundle branch block, 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   Personal history of transient ischemic attack (TIA), and cerebral infarction without residual deficits 11/23/2012   Encounter for long-term (current) use of aspirin 11/23/2012   Status post percutaneous transluminal coronary angioplasty 11/23/2012   Transient ischemic attack (TIA), and cerebral infarction without residual deficits(V12.54) 11/23/2012   Anemia 08/17/2012   Chronic obstructive pulmonary disease ([place]) 08/17/2012   HLD (hyperlipidemia) 08/17/2012   OP (osteoporosis) 08/17/2012   Type 2 diabetes mellitus with hyperlipidemia ([place]) 08/17/2012   Ischemic heart disease due to coronary artery obstruction ([place]) 07/24/2012    Immunization History  Administered Date(s) Administered   Fluad Quad(high Dose 65+) 12/01/2020, 12/10/2021   Influenza, High Dose Seasonal PF 02/12/2016, 12/16/2016, 12/19/2017   Influenza,inj,Quad PF,6+ Mos 12/09/2019   PFIZER(Purple Top)SARS-COV-2 Vaccination 07/14/2019, 08/14/2019   Pfizer Covid-19 Vaccine Bivalent Booster 5y-11y 01/27/2021   Pneumococcal Polysaccharide-23 06/02/2021   Zoster Recombinat (Shingrix) 05/07/2018, 11/21/2018    Conditions to be addressed/monitored:  Hypertension, Hyperlipidemia, Diabetes, Heart Failure, Coronary Artery Disease,  GERD, COPD, Anxiety, Osteoporosis, and Insomnia, History of TIA  Care Plan : General Pharmacy (Adult)  Updates made by Germaine Pomfret, RPH since 02/02/2022 12:00 AM     Problem: Hypertension, Hyperlipidemia, Diabetes, Heart Failure, Coronary Artery Disease, GERD, COPD, Anxiety, Osteoporosis, and Insomnia, History of TIA   Priority: High     Long-Range Goal: Patient-Specific Goal   Start Date: 06/23/2021  Expected End Date: 07/07/2022  This Visit's Progress: On track  Recent Progress: On track  Priority: High  Note:   Current Barriers:  Unable to independently afford treatment regimen Unable to achieve control of diabetes   Pharmacist Clinical Goal(s):  Patient will verbalize ability to afford treatment regimen achieve control of diabetes as evidenced by A1c less than 8% through collaboration with PharmD and provider.   Interventions: 1:1 collaboration with [name], FNP regarding development and update of comprehensive plan of care as evidenced by provider attestation and co-signature Inter-disciplinary care team collaboration (see longitudinal plan of care) Comprehensive medication review performed; medication list updated in electronic medical record  Heart Failure (Goal: manage symptoms and prevent exacerbations) -Controlled: Not addressed during this visit. -Last ejection fraction: 40-45% (Date: Jan 2023) -HF type: Systolic -NYHA Class: III (marked limitation of activity) -Current treatment: Farxiga 10 mg daily: Appropriate, Effective, Safe, Accessible  Furosemide 20 mg daily: Appropriate, Effective, Safe, Accessible Losartan 25 mg daily: Appropriate, Effective, Safe, Accessible  Metoprolol XL 50 mg daily: Appropriate, Effective, Safe, Accessible  -Medications previously tried: NA  -Current home BP/HR readings: Not monitoring -Continue current medications  Hyperlipidemia: (LDL goal < 55) -Controlled -History  of PAD, CAD, TIA -Current treatment: Atorvastatin  20 mg daily: Appropriate, Effective, Safe, Accessible  -Medications previously tried: NA  -Recommended to continue current medication  Diabetes (A1c goal <8%) -Uncontrolled -Current medications: Farxiga 10 mg daily: Appropriate, Effective, Safe, Accessible  Tresiba 32 units daily (0.5 u/kg): Appropriate, Query effective  Ozempic 0.5 mg weekly: Appropriate, Query effective  -Medications previously tried: Jardiance (Yeast Infection) -Current home glucose readings  Fasting  28-Nov 161  27-Nov 180  26-Nov 114  25-Nov 138  24-Nov 114  23-Nov 160  22-Nov 138  21-Nov 114  19-Nov 91  18-Nov 85  17-Nov 103  16-Nov 158  15-Nov 100  14-Nov 103  13-Nov 97  12-Nov 119  11-Nov 93  Average 122  -Reports hyperglycemic symptoms: polydipsia  -Current dietary Patterns: Cut out concentrated carbohydrates, eating more cooked vegetables. Drinks only coffee and unsweetened tea throughout the day.  -Continue current medications  Anxiety / Insomnia (Goal: Maintain symptom remission) -Controlled: Not addressed during this visit. -Current treatment: Hydroxyzine 25 mg three times daily as needed: Appropriate, Effective, Safe, Accessible Sertraline 25 mg daily: Appropriate, Effective, Safe, Accessible  -Medications previously tried/failed: NA -GAD7: 8 -Enjoys puzzles, word searches  -Continue current medications   Osteoporosis / Osteopenia (Goal Prevent fractures) -Uncontrolled: Not addressed during this visit. -Patient is a candidate for pharmacologic treatment due to T-Score < -2.5 in femoral neck -Current treatment  Calcium-Vitamin D twice daily: Appropriate, Effective, Safe, Accessible  Ergocalciferol 50,000 units weekly: Appropriate, Effective, Safe, Accessible  -Medications previously tried: NA  -Recommended to continue current medication  GERD (Goal: Prevent reflux) -Controlled: Not addressed during this visit. -Current treatment  Omeprazole 40 mg daily  -Medications previously  tried: NA  -Excellent symptom control, rebound symptoms if skipping a day.  -Recommended to continue current medication  Overactive Bladder (Goal: Minimize symptoms) -Controlled: Not addressed during this visit. -Current treatment  Oxybutynin XL 10 mg daily  -Medications previously tried: NA -Waking up 2-3 times nightly, much better controlled with oxybutynin.  -Will see if symptom changes as hyperglycemia improves.   -Recommended to continue current medication  Patient Goals/Self-Care Activities Patient will:  - check glucose daily before breakfast, document, and provide at future appointments check blood pressure 2-3 times weekly, document, and provide at future appointments weigh daily, and contact provider if weight gain of greater than 2 pounds in 24 hours  Follow Up Plan: Telephone follow up appointment with care management team member scheduled for:  03/30/2022 at 11:00 AM     Medication Assistance:  Joni Reining obtained through [name] medication assistance program.  Enrollment ends Dec 2023  Compliance/Adherence/Medication fill history: Care Gaps: Tetanus/TDAP Dexa Scan Diabetic Foot Exam  Star-Rating Drugs: Onboarding to [facility]   Patient's preferred pharmacy is:  [facility], [place] [address] Phone: [phone] Fax: [place], [place] - [address] [place] [address] Phone: [phone] Fax: [phone]  [id number] [id number]: [phone]: [phone] Uses pill box? Yes Pt endorses 100% compliance  Patient decided to: Utilize [facility] pharmacy for medication synchronization, packaging and delivery  Care Plan and Follow Up Patient Decision:  Patient agrees to Care Plan  and Follow-up.  Plan: Telephone follow up appointment with care management team member scheduled for:  03/30/2022 at  11:00 AM  [name], PharmD, Para March, CPP  Clinical Pharmacist Practitioner  [facility] [phone]

## 2022-02-04 DIAGNOSIS — E1159 Type 2 diabetes mellitus with other circulatory complications: Secondary | ICD-10-CM | POA: Diagnosis not present

## 2022-02-04 DIAGNOSIS — E785 Hyperlipidemia, unspecified: Secondary | ICD-10-CM | POA: Diagnosis not present

## 2022-02-04 DIAGNOSIS — I5020 Unspecified systolic (congestive) heart failure: Secondary | ICD-10-CM | POA: Diagnosis not present

## 2022-02-04 DIAGNOSIS — Z7985 Long-term (current) use of injectable non-insulin antidiabetic drugs: Secondary | ICD-10-CM | POA: Diagnosis not present

## 2022-02-04 DIAGNOSIS — M810 Age-related osteoporosis without current pathological fracture: Secondary | ICD-10-CM | POA: Diagnosis not present

## 2022-02-05 ENCOUNTER — Telehealth: Payer: Medicare Other

## 2022-02-15 ENCOUNTER — Other Ambulatory Visit: Payer: Self-pay | Admitting: Family Medicine

## 2022-02-15 DIAGNOSIS — I5022 Chronic systolic (congestive) heart failure: Secondary | ICD-10-CM

## 2022-02-18 ENCOUNTER — Telehealth: Payer: Self-pay

## 2022-02-18 NOTE — Progress Notes (Signed)
Chronic Care Management Pharmacy Assistant   Name: Andrea [name]  MRN: [id number] DOB: [birth year]  Reason for Encounter: Medication Review/Medication Coordination for [facility]   Recent office visits:  None ID  Recent consult visits:  None ID  Hospital visits:  None in previous 6 months  Medications: Outpatient Encounter Medications as of 02/18/2022  Medication Sig   Alcohol Swabs (ALCOHOL PADS) 70 % PADS 1 each by Does not apply route in the morning and at bedtime.   atorvastatin (LIPITOR) 20 MG tablet Take 1 tablet (20 mg total) by mouth at bedtime.   Calcium Carbonate-Vitamin D 600-400 MG-UNIT tablet Take 1 tablet by mouth daily.   clobetasol (TEMOVATE) 0.05 % external solution Apply 1 application. topically 2 (two) times daily.   Cyanocobalamin (B-12) 1000 MCG CAPS Take 1,000 mcg by mouth daily.   FARXIGA 10 MG TABS tablet TAKE ONE TABLET BY MOUTH ONCE DAILY BEFORE BREAKFAST   furosemide (LASIX) 20 MG tablet Take 1 tablet (20 mg total) by mouth daily.   hydrOXYzine (ATARAX) 25 MG tablet TAKE 1 TABLET BY MOUTH THREE TIMES A DAY AS NEEDED   insulin degludec (TRESIBA FLEXTOUCH) 100 UNIT/ML FlexTouch Pen Inject 32 Units into the skin at bedtime. Patient receives via [name] Patient Assistance through December 2023   Insulin Pen Needle (TRUEPLUS PEN NEEDLES) 31G X 6 MM MISC Use with lantus two times daily.   INSULIN SYRINGE 1CC/29G 29G X 1/2" 1 ML MISC For lantus injections twice daily   losartan (COZAAR) 25 MG tablet TAKE 1 TABLET BY MOUTH EVERY DAY   metoprolol succinate (TOPROL-XL) 50 MG 24 hr tablet Take 1 tablet (50 mg total) by mouth daily.   montelukast (SINGULAIR) 10 MG tablet Take 1 tablet (10 mg total) by mouth at bedtime.   Multiple Vitamins-Minerals (PRESERVISION AREDS 2 PO) Take 1 tablet by mouth in the morning and at bedtime.   Omega-3 1000 MG CAPS Take 1,000 mg by mouth daily.   omeprazole (PRILOSEC) 40 MG capsule Take 1 capsule (40 mg total) by  mouth at bedtime.   OneTouch Delica Lancets 16W MISC To check blood sugar daily  DX: E11.9   ONETOUCH VERIO test strip TO CHECK BLOOD SUGAR ONCE DAILY.   oxybutynin (DITROPAN-XL) 10 MG 24 hr tablet Take 1 tablet (10 mg total) by mouth at bedtime.   Semaglutide,0.25 or 0.'5MG'$ /DOS, (OZEMPIC, 0.25 OR 0.5 MG/DOSE,) 2 MG/3ML SOPN Inject 0.5 mg into the skin once a week. Patient receives via [name] Patient Assistance through December 2023   sertraline (ZOLOFT) 25 MG tablet Take 1 tablet (25 mg total) by mouth daily.   Vitamin D, Ergocalciferol, (DRISDOL) 1.25 MG (50000 UNIT) CAPS capsule Take 1 capsule (50,000 Units total) by mouth every 7 (seven) days.   No facility-administered encounter medications on file as of 02/18/2022.   Care Gaps: Tetanus/TDAP Lung Cancer Screening Dexa Scan Diabetic Foot Exam  Star Rating Drugs: Atorvastatin 20 mg last filled on 01/27/2022 for a 30-Day supply with [place] 10 mg last filled on 12/28/2021 for a 30-Day supply with [facility]-Patient is trying to get patient assistance for this medication Losartan 25 mg last filled on 01/27/2022 for a 30-Day supply with [facility] Ozempic 0.5 mg patient receives this medication via [name] Patient Assistance Program  BP Readings from Last 3 Encounters:  12/10/21 (!) 127/56  09/03/21 101/63  06/02/21 115/71    Lab Results  Component Value Date   HGBA1C 8.6 (H) 12/10/2021  Patient obtains medications through Adherence Packaging  30 Days   Last adherence delivery included:  Atorvastatin 20 mg 1 tablet daily (Bedtime) Furosemide 20 mg 1 tablet daily (Breakfast) Losartan 25 mg 1 tablet daily (Breakfast) Metoprolol XL 1 tablet daily (Breakfast) Omeprazole  40 mg 1 tablet daily (Breakfast) Oxybutynin XL 10 mg 1 tablet daily (Bedtime) Montelukast 10 mg 1 tablet daily (Bedtime) Vitamin D 50,000 U 1 tablet weekly (on Fridays) Sertraline 25 mg 1 tablet daily  (Breakfast)  Patient declined medications for the month of November: No medications were declined last month  Patient is due for next adherence delivery on: 03/03/2022 1st Route.  Called patient and reviewed medications and coordinated delivery.  This delivery to include: Atorvastatin 20 mg 1 tablet daily (Bedtime) Furosemide 20 mg 1 tablet daily (Breakfast) Losartan 25 mg 1 tablet daily (Breakfast) Metoprolol XL 1 tablet daily (Breakfast) Omeprazole  40 mg 1 tablet daily (Breakfast) Oxybutynin XL 10 mg 1 tablet daily (Bedtime) Montelukast 10 mg 1 tablet daily (Bedtime) Vitamin D 50,000 U 1 tablet weekly (on Fridays) Sertraline 25 mg 1 tablet daily (Breakfast)  Patient declined the following medications for the month of December: No medications were declined for this month's delivery  Patient needs refills for the month of December: No refills needed for this month's delivery  Confirmed delivery date of 03/03/2022 1st Route, advised patient that pharmacy will contact them the morning of delivery.  Patient has an upcoming appointment with [name], CPP on 03/30/2022 @ 1100.  Lynann Bologna, CPA/CMA Clinical Pharmacist Assistant Phone: [phone]

## 2022-02-22 ENCOUNTER — Telehealth: Payer: Self-pay

## 2022-02-22 NOTE — Progress Notes (Signed)
  Chronic Care Management   Note  02/22/2022 Name: HAVYN RAMO MRN: [id number] DOB: 1946/02/07  DASHANTI BURR is a 76 y.o. year old female who is a primary care patient of [name], FNP. I reached out to [name] by phone today in response to a referral sent by Ms. Verdene Lennert Ordoyne's PCP.  Ms. PRESSLEY BARSKY was not successfully contacted today. A HIPAA compliant voice message was left requesting a return call.   Follow up plan: Additional outreach attempts will be made.  Noreene Larsson, [place], [place] [address] Direct Dial: [phone] [email or web address]

## 2022-02-26 NOTE — Progress Notes (Signed)
  Chronic Care Management   Note  02/26/2022 Name: Andrea [name] MRN: [id number] DOB: 18-Aug-1945  Andrea [name] is a 76 y.o. year old female who is a primary care patient of [name], FNP. I reached out to [name] by phone today in response to a referral sent by [name]'s PCP.  [name] was given information about Chronic Care Management services today including:  CCM service includes personalized support from designated clinical staff supervised by the physician, including individualized plan of care and coordination with other care providers 24/7 contact phone numbers for assistance for urgent and routine care needs. Service will only be billed when office clinical staff spend 20 minutes or more in a month to coordinate care. Only one practitioner may furnish and bill the service in a calendar month. The patient may stop CCM services at amy time (effective at the end of the month) by phone call to the office staff. The patient will be responsible for cost sharing (co-pay) or up to 20% of the service fee (after annual deductible is met)  [name]  agreedto scheduling an appointment with the CCM RN Case Manager   Follow up plan: Patient agreed to scheduled appointment with RN Case Manager on 03/17/2022(date/time).   Noreene Larsson, [place], [place] [address] Direct Dial: [phone] [email or web address]

## 2022-03-09 NOTE — Progress Notes (Unsigned)
I,Andrea [name],acting as a scribe for Andrea Sprout, FNP.,have documented all relevant documentation on the behalf of Andrea Sprout, FNP,as directed by  Andrea Sprout, FNP while in the presence of Andrea Sprout, FNP.   Established patient visit  Patient: Andrea [name]   DOB: [birth year]   77 y.o. Female  MRN: [id number] Visit Date: 03/10/2022  Today's healthcare provider: [name], FNP  Re Introduced to nurse practitioner role and practice setting.  All questions answered.  Discussed provider/patient relationship and expectations.  Chief Complaint  Patient presents with   Follow-Up DM   Hypertension   Subjective    HPI  Diabetes Mellitus Type II, Follow-up  Lab Results  Component Value Date   HGBA1C 8.6 (H) 12/10/2021   HGBA1C 10.4 (A) 09/03/2021   HGBA1C 11.3 06/03/2021   Wt Readings from Last 3 Encounters:  03/10/22 141 lb 6.4 oz (64.1 kg)  12/10/21 141 lb (64 kg)  09/03/21 143 lb 8 oz (65.1 kg)   Last seen for diabetes 3 months ago taking 32 u tresiba and 0.5 mg ozempic and trulicity 1.5 mg/wk go. Per patient she is only taking Ozempic and [name]  She reports excellent compliance with treatment.  Symptoms: No fatigue No foot ulcerations  No appetite changes No nausea  No paresthesia of the feet  No polydipsia  Yes polyuria No visual disturbances   No vomiting     Home blood sugar records: fasting range: 100' ; lowest 89; highest 140s  Episodes of hypoglycemia? No    Most Recent Eye Exam: 04/08/2021   Pertinent Labs: Lab Results  Component Value Date   CHOL 132 12/10/2021   HDL 58 12/10/2021   LDLCALC 45 12/10/2021   TRIG 180 (H) 12/10/2021   CHOLHDL 2.3 12/10/2021   Lab Results  Component Value Date   NA 139 12/10/2021   K 5.1 12/10/2021   CREATININE 1.18 (H) 12/10/2021   EGFR 48 (L) 12/10/2021   MICROALBUR 20 12/16/2016   LABMICR 57.1 12/10/2021      ---------------------------------------------------------------------------------------------------  Patient concerns of possible UTI. Reports urine has odor to it and is cloudy. No burning, No hematuria, No dysuria.  Medications: Outpatient Medications Prior to Visit  Medication Sig   Alcohol Swabs (ALCOHOL PADS) 70 % PADS 1 each by Does not apply route in the morning and at bedtime.   atorvastatin (LIPITOR) 20 MG tablet Take 1 tablet (20 mg total) by mouth at bedtime.   Calcium Carbonate-Vitamin D 600-400 MG-UNIT tablet Take 1 tablet by mouth daily.   clobetasol (TEMOVATE) 0.05 % external solution Apply 1 application. topically 2 (two) times daily.   Cyanocobalamin (B-12) 1000 MCG CAPS Take 1,000 mcg by mouth daily.   furosemide (LASIX) 20 MG tablet Take 1 tablet (20 mg total) by mouth daily.   hydrOXYzine (ATARAX) 25 MG tablet TAKE 1 TABLET BY MOUTH THREE TIMES A DAY AS NEEDED   insulin degludec (TRESIBA FLEXTOUCH) 100 UNIT/ML FlexTouch Pen Inject 32 Units into the skin at bedtime. Patient receives via [name] Patient Assistance through December 2023   Insulin Pen Needle (TRUEPLUS PEN NEEDLES) 31G X 6 MM MISC Use with lantus two times daily.   INSULIN SYRINGE 1CC/29G 29G X 1/2" 1 ML MISC For lantus injections twice daily   losartan (COZAAR) 25 MG tablet TAKE 1 TABLET BY MOUTH EVERY DAY   metoprolol succinate (TOPROL-XL) 50 MG 24 hr tablet Take 1 tablet (50 mg total) by mouth daily.  montelukast (SINGULAIR) 10 MG tablet Take 1 tablet (10 mg total) by mouth at bedtime.   Multiple Vitamins-Minerals (PRESERVISION AREDS 2 PO) Take 1 tablet by mouth in the morning and at bedtime.   Omega-3 1000 MG CAPS Take 1,000 mg by mouth daily.   omeprazole (PRILOSEC) 40 MG capsule Take 1 capsule (40 mg total) by mouth at bedtime.   OneTouch Delica Lancets 54O MISC To check blood sugar daily  DX: E11.9   ONETOUCH VERIO test strip TO CHECK BLOOD SUGAR ONCE DAILY.   oxybutynin (DITROPAN-XL) 10 MG 24  hr tablet Take 1 tablet (10 mg total) by mouth at bedtime.   Semaglutide,0.25 or 0.5MG/DOS, (OZEMPIC, 0.25 OR 0.5 MG/DOSE,) 2 MG/3ML SOPN Inject 0.5 mg into the skin once a week. Patient receives via [name] Patient Assistance through December 2023   sertraline (ZOLOFT) 25 MG tablet Take 1 tablet (25 mg total) by mouth daily.   Vitamin D, Ergocalciferol, (DRISDOL) 1.25 MG (50000 UNIT) CAPS capsule Take 1 capsule (50,000 Units total) by mouth every 7 (seven) days.   [DISCONTINUED] FARXIGA 10 MG TABS tablet TAKE ONE TABLET BY MOUTH ONCE DAILY BEFORE BREAKFAST   No facility-administered medications prior to visit.    Review of Systems    Objective    BP (!) 122/55 (BP Location: Left Arm, Patient Position: Sitting, Cuff Size: Normal)   Pulse 92   Temp 97.8 F (36.6 C) (Oral)   Resp 16   Wt 141 lb 6.4 oz (64.1 kg)   BMI 30.60 kg/m   Physical Exam Vitals and nursing note reviewed.  Constitutional:      General: She is not in acute distress.    Appearance: Normal appearance. She is obese. She is not ill-appearing, toxic-appearing or diaphoretic.  HENT:     Head: Normocephalic and atraumatic.  Cardiovascular:     Rate and Rhythm: Normal rate and regular rhythm.     Pulses: Normal pulses.     Heart sounds: Normal heart sounds. No murmur heard.    No friction rub. No gallop.  Pulmonary:     Effort: Pulmonary effort is normal. No respiratory distress.     Breath sounds: Normal breath sounds. No stridor. No wheezing, rhonchi or rales.  Chest:     Chest wall: No tenderness.  Musculoskeletal:        General: No swelling, tenderness, deformity or signs of injury. Normal range of motion.     Right lower leg: No edema.     Left lower leg: No edema.  Skin:    General: Skin is warm and dry.     Capillary Refill: Capillary refill takes less than 2 seconds.     Coloration: Skin is not jaundiced or pale.     Findings: No bruising, erythema, lesion or rash.  Neurological:     General:  No focal deficit present.     Mental Status: She is alert and oriented to person, place, and time. Mental status is at baseline.     Cranial Nerves: No cranial nerve deficit.     Sensory: No sensory deficit.     Motor: No weakness.     Coordination: Coordination normal.  Psychiatric:        Mood and Affect: Mood normal.        Behavior: Behavior normal.        Thought Content: Thought content normal.        Judgment: Judgment normal.     Results for orders placed or performed  in visit on 03/10/22  POCT urinalysis dipstick  Result Value Ref Range   Color, UA light yellow    Clarity, UA Cloudy    Glucose, UA Positive (A) Negative   Bilirubin, UA Negative    Ketones, UA Negative    Spec Grav, UA <=1.005 (A) 1.010 - 1.025   Blood, UA Negative    pH, UA 6.0 5.0 - 8.0   Protein, UA Negative Negative   Urobilinogen, UA 0.2 0.2 or 1.0 E.U./dL   Nitrite, UA Negative    Leukocytes, UA Moderate (2+) (A) Negative   Appearance     Odor      Assessment & Plan     Problem List Items Addressed This Visit       Endocrine   Type 2 diabetes mellitus with diabetic polyneuropathy, without long-term current use of insulin (HCC) - Primary    Chronic, previously elevated Repeat A1c and CMP Urinary frequency noted; will send for culture given increase in glucose with presence of moderate leukocytes A1c goal of <7.6% in setting of age, chronic conditions- HLD, HTN, CKD etc Continue to recommend balanced, lower carb meals. Smaller meal size, adding snacks. Choosing water as drink of choice and increasing purposeful exercise. 6 pills left of Farxiga 10 mg given cost concerns Continue 32 units tresiba Continue 0.5 mg ozempic      Relevant Medications   dapagliflozin propanediol (FARXIGA) 10 MG TABS tablet   Other Relevant Orders   Comprehensive metabolic panel   Hemoglobin A1c     Other   Asthenia    Acute on chronic, primarily relies on w/c at home- encouraged to use walker to assist,  focus on posture modifications to engage core when using walker- frequently too far in front and hunching over Encourage more sit to stands when alone to build leg and core strength Daughter feels as if pt has "given up" Pt declines use of HHA or PT at this time 3 month f/u recommended       Urinary frequency    Acute, in setting of DM POCT UA completed in office with lab urine micro and culture given high presence of glucose and moderate leukocytes Continue to prioritize water to assist CKD in setting of previously uncontrolled DM      Relevant Orders   Urine Culture   Urinalysis, Routine w reflex microscopic   POCT urinalysis dipstick (Completed)   Return in about 3 months (around 06/09/2022).     Vonna Kotyk, FNP, have reviewed all documentation for this visit. The documentation on 03/10/22 for the exam, diagnosis, procedures, and orders are all accurate and complete.  Andrea [name], [place] [phone] (phone) [phone] (fax)  [place]

## 2022-03-10 ENCOUNTER — Encounter: Payer: Self-pay | Admitting: Family Medicine

## 2022-03-10 ENCOUNTER — Ambulatory Visit (INDEPENDENT_AMBULATORY_CARE_PROVIDER_SITE_OTHER): Payer: Medicare Other | Admitting: Family Medicine

## 2022-03-10 VITALS — BP 122/55 | HR 92 | Temp 97.8°F | Resp 16 | Wt 141.4 lb

## 2022-03-10 DIAGNOSIS — E1142 Type 2 diabetes mellitus with diabetic polyneuropathy: Secondary | ICD-10-CM | POA: Diagnosis not present

## 2022-03-10 DIAGNOSIS — R350 Frequency of micturition: Secondary | ICD-10-CM | POA: Diagnosis not present

## 2022-03-10 DIAGNOSIS — R531 Weakness: Secondary | ICD-10-CM

## 2022-03-10 LAB — POCT URINALYSIS DIPSTICK
Bilirubin, UA: NEGATIVE
Blood, UA: NEGATIVE
Glucose, UA: POSITIVE — AB
Ketones, UA: NEGATIVE
Nitrite, UA: NEGATIVE
Protein, UA: NEGATIVE
Spec Grav, UA: <=1.005 — AB (ref 1.010–1.025)
Urobilinogen, UA: 0.2 E.U./dL
pH, UA: 6 (ref 5.0–8.0)

## 2022-03-10 MED ORDER — DAPAGLIFLOZIN PROPANEDIOL 10 MG PO TABS: ORAL_TABLET | ORAL | 0 refills | Status: DC

## 2022-03-10 NOTE — Assessment & Plan Note (Signed)
Acute on chronic, primarily relies on w/c at home- encouraged to use walker to assist, focus on posture modifications to engage core when using walker- frequently too far in front and hunching over Encourage more sit to stands when alone to build leg and core strength Daughter feels as if pt has "given up" Pt declines use of HHA or PT at this time 3 month f/u recommended

## 2022-03-10 NOTE — Assessment & Plan Note (Signed)
Chronic, previously elevated Repeat A1c and CMP Urinary frequency noted; will send for culture given increase in glucose with presence of moderate leukocytes A1c goal of <7.6% in setting of age, chronic conditions- HLD, HTN, CKD etc Continue to recommend balanced, lower carb meals. Smaller meal size, adding snacks. Choosing water as drink of choice and increasing purposeful exercise. 6 pills left of Farxiga 10 mg given cost concerns Continue 32 units tresiba Continue 0.5 mg ozempic

## 2022-03-10 NOTE — Assessment & Plan Note (Signed)
Acute, in setting of DM POCT UA completed in office with lab urine micro and culture given high presence of glucose and moderate leukocytes Continue to prioritize water to assist CKD in setting of previously uncontrolled DM

## 2022-03-11 LAB — URINALYSIS, ROUTINE W REFLEX MICROSCOPIC
Bilirubin, UA: NEGATIVE
Ketones, UA: NEGATIVE
Nitrite, UA: NEGATIVE
Protein,UA: NEGATIVE
RBC, UA: NEGATIVE
Specific Gravity, UA: 1.011 (ref 1.005–1.030)
Urobilinogen, Ur: 0.2 mg/dL (ref 0.2–1.0)
pH, UA: 5.5 (ref 5.0–7.5)

## 2022-03-11 LAB — COMPREHENSIVE METABOLIC PANEL
ALT: 11 IU/L (ref 0–32)
AST: 15 IU/L (ref 0–40)
Albumin/Globulin Ratio: 1.8 (ref 1.2–2.2)
Albumin: 4.4 g/dL (ref 3.8–4.8)
Alkaline Phosphatase: 155 IU/L — ABNORMAL HIGH (ref 44–121)
BUN/Creatinine Ratio: 22 (ref 12–28)
BUN: 29 mg/dL — ABNORMAL HIGH (ref 8–27)
Bilirubin Total: 0.6 mg/dL (ref 0.0–1.2)
CO2: 24 mmol/L (ref 20–29)
Calcium: 10.1 mg/dL (ref 8.7–10.3)
Chloride: 101 mmol/L (ref 96–106)
Creatinine, Ser: 1.32 mg/dL — ABNORMAL HIGH (ref 0.57–1.00)
Globulin, Total: 2.4 g/dL (ref 1.5–4.5)
Glucose: 147 mg/dL — ABNORMAL HIGH (ref 70–99)
Potassium: 4.7 mmol/L (ref 3.5–5.2)
Sodium: 144 mmol/L (ref 134–144)
Total Protein: 6.8 g/dL (ref 6.0–8.5)
eGFR: 42 mL/min/{1.73_m2} — ABNORMAL LOW (ref >59)

## 2022-03-11 LAB — MICROSCOPIC EXAMINATION
Casts: NONE SEEN /lpf
RBC, Urine: NONE SEEN /hpf (ref 0–2)
WBC, UA: >30 /hpf — AB (ref 0–5)

## 2022-03-11 LAB — HEMOGLOBIN A1C
Est. average glucose Bld gHb Est-mCnc: 171 mg/dL
Hgb A1c MFr Bld: 7.6 % — ABNORMAL HIGH (ref 4.8–5.6)

## 2022-03-11 NOTE — Progress Notes (Signed)
Kidney function has continued to decrease. Liver enzymes continue to show elevated fat in diet. Continue to prioritize water- goal of 48 oz/day to assist. I continue to recommend diet low in saturated fat and regular exercise - 30 min at least 5 times per week. Continue to work with Cristie Hem Pharmacist to get assistance for [place] to support kidney health.  A1c has improved by 1%. It is back to where it was roughly 1 year ago at 7.6%. We know that an A1c <7% is the definition of "good glycemic control." Keep up the hard work. Continue to recommend balanced, lower carb meals. Smaller meal size, adding snacks. Choosing water as drink of choice and increasing purposeful exercise.  Urine culture pending.

## 2022-03-15 ENCOUNTER — Other Ambulatory Visit: Payer: Self-pay | Admitting: Family Medicine

## 2022-03-15 ENCOUNTER — Telehealth: Payer: Self-pay

## 2022-03-15 LAB — URINE CULTURE

## 2022-03-15 MED ORDER — NITROFURANTOIN MONOHYD MACRO 100 MG PO CAPS: 100.0000 mg | ORAL_CAPSULE | Freq: Two times a day (BID) | ORAL | 0 refills | Status: DC

## 2022-03-15 NOTE — Progress Notes (Signed)
[facility].

## 2022-03-15 NOTE — Telephone Encounter (Signed)
-----   Message from Erie Noe, RN sent at 03/15/2022 11:58 AM EST ----- Pt given lab results per notes of [name], NP on 03/15/22. Pt verbalized understanding. Pt states that she needs abx sent to [facility] d/t not using [facility] anymore. Advised I would send message back to provider for pharmacy change.

## 2022-03-17 ENCOUNTER — Telehealth: Payer: Medicare Other

## 2022-03-17 ENCOUNTER — Ambulatory Visit (INDEPENDENT_AMBULATORY_CARE_PROVIDER_SITE_OTHER): Payer: Medicare Other

## 2022-03-17 DIAGNOSIS — E1159 Type 2 diabetes mellitus with other circulatory complications: Secondary | ICD-10-CM

## 2022-03-17 DIAGNOSIS — E1142 Type 2 diabetes mellitus with diabetic polyneuropathy: Secondary | ICD-10-CM

## 2022-03-17 NOTE — Chronic Care Management (AMB) (Unsigned)
  Chronic Care Management   CCM RN Visit Note  03/17/2022 Name: [name] MRN: [id number] DOB: 12-Jun-1945  Subjective: Andrea [name] is a 77 y.o. year old female who is a primary care patient of [name], FNP. The patient was referred to the Chronic Care Management team for assistance with care management needs subsequent to provider initiation of CCM services and plan of care.    Today's Visit:  Engaged with patient by telephone for initial visit.     SDOH Interventions Today    Flowsheet Row Most Recent Value  SDOH Interventions   Food Insecurity Interventions Intervention Not Indicated  Housing Interventions Intervention Not Indicated  Transportation Interventions Intervention Not Indicated  Utilities Interventions Intervention Not Indicated  Alcohol Usage Interventions Intervention Not Indicated (Score <7)  Financial Strain Interventions Intervention Not Indicated  Physical Activity Interventions Other (Comments)  [Declined need for activity resources]  Stress Interventions Intervention Not Indicated  Social Connections Interventions Other (Comment)  [Declined need for community resources]             PLAN: Will follow up next month   SIG***

## 2022-03-17 NOTE — Plan of Care (Unsigned)
Chronic Care Management Provider Comprehensive Care Plan    03/17/2022 Name: Andrea [name] MRN: [id number] DOB: 08-Nov-1945  Referral to Chronic Care Management (CCM) services was placed by Provider:  [name], FNP on Date: 01/15/22.  Chronic Condition 1: DM Provider Assessment and Plan  Type 2 diabetes mellitus with diabetic polyneuropathy, without long-term current use of insulin (HCC) - Primary       Chronic, previously elevated Repeat A1c and CMP Urinary frequency noted; will send for culture given increase in glucose with presence of moderate leukocytes A1c goal of <7.6% in setting of age, chronic conditions- HLD, HTN, CKD etc Continue to recommend balanced, lower carb meals. Smaller meal size, adding snacks. Choosing water as drink of choice and increasing purposeful exercise. 6 pills left of Farxiga 10 mg given cost concerns Continue 32 units tresiba Continue 0.5 mg ozempic       Expected Outcome/Goals Addressed This Visit (Provider CCM goals/Provider Assessment and plan  Goal: CCM (Diabetes) Expected Outcome:  Monitor, Self-Manage And Reduce Symptoms of Diabetes  Symptom Management Condition 1: Take medications as prescribed   Attend all scheduled provider appointments Call pharmacy for medication refills 3-7 days in advance of running out of medications Check blood sugars daily  Keep a blood sugar log Check feet daily for cuts, sores or redness Wash and dry feet carefully every day Wear comfortable, cotton socks Wear comfortable, well-fitting shoes Read food labels for fat, fiber, carbohydrates and portion size Schedule appointment with eye doctor Call provider office for new concerns or questions      Chronic Condition 2: HTN Provider Assessment and Plan  Hypertension associated with diabetes ([name])       Chronic, stable At goal Continue 25 losartan       Expected Outcome/Goals Addressed This Visit (Provider CCM goals/Provider Assessment and plan   Goal: CCM (Hypertension) Expected Outcome:  Monitor, Self-Manage And Reduce Symptoms of Hypertension  Symptom Management Condition 2: Take medications as prescribed   Attend all scheduled provider appointments Call pharmacy for medication refills 3-7 days in advance of running out of medications Call provider office for new concerns or questions  Check blood pressure  Keep a blood pressure log Call doctor for signs and symptoms of high blood pressure Report new symptoms to your doctor Eat whole grains, fruits and vegetables, lean meats and healthy fats Limit salt intake   Problem List Patient Active Problem List   Diagnosis Date Noted   Asthenia 03/10/2022   Urinary frequency 03/10/2022   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 ([place]) 12/10/2021   Type 2 diabetes mellitus with diabetic polyneuropathy, without long-term current use of insulin ([name]) 12/10/2021   Gastroesophageal reflux disease without esophagitis 11/13/2021   Anxiety 11/13/2021   Vitamin D deficiency 11/13/2021   Hypertension associated with diabetes ([name]) 09/03/2021   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 ([name]) 09/03/2021   Need for pneumococcal vaccination 06/02/2021   Hyperlipidemia LDL goal <70 04/28/2021   Essential hypertension    Depression    Chronic systolic CHF (congestive heart failure) ([name]) 12/18/2020   Hypoglycemia 11/13/2020   History of myocardial infarction 03/09/2019   PAD (peripheral artery disease) ([place]) 12/28/2018   Coronary artery disease involving native coronary artery 01/06/2017   Drug-induced constipation 01/06/2017   Bundle branch block, 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   Personal history of transient ischemic attack (TIA), and cerebral infarction without residual deficits 11/23/2012   Status post percutaneous transluminal coronary angioplasty 11/23/2012   Transient ischemic  attack (TIA), and cerebral infarction without residual deficits(V12.54) 11/23/2012   Chronic obstructive  pulmonary disease ([place]) 08/17/2012   OP (osteoporosis) 08/17/2012   Type 2 diabetes mellitus with hyperlipidemia ([name]) 08/17/2012   Ischemic heart disease due to coronary artery obstruction ([name]) 07/24/2012    Medication Management  Current Outpatient Medications:    Multiple Vitamins-Minerals (ICAPS AREDS 2 PO), Take by mouth., Disp: , Rfl:    Alcohol Swabs (ALCOHOL PADS) 70 % PADS, 1 each by Does not apply route in the morning and at bedtime., Disp: 100 each, Rfl: 11   atorvastatin (LIPITOR) 20 MG tablet, Take 1 tablet (20 mg total) by mouth at bedtime., Disp: 90 tablet, Rfl: 1   Calcium Carbonate-Vitamin D 600-400 MG-UNIT tablet, Take 1 tablet by mouth daily., Disp: , Rfl:    clobetasol (TEMOVATE) 0.05 % external solution, Apply 1 application. topically 2 (two) times daily., Disp: 50 mL, Rfl: 1   Cyanocobalamin (B-12) 1000 MCG CAPS, Take 1,000 mcg by mouth daily., Disp: , Rfl:    dapagliflozin propanediol (FARXIGA) 10 MG TABS tablet, TAKE ONE TABLET BY MOUTH ONCE DAILY BEFORE BREAKFAST, Disp: 30 tablet, Rfl: 0   furosemide (LASIX) 20 MG tablet, Take 1 tablet (20 mg total) by mouth daily., Disp: 90 tablet, Rfl: 1   hydrOXYzine (ATARAX) 25 MG tablet, TAKE 1 TABLET BY MOUTH THREE TIMES A DAY AS NEEDED, Disp: 270 tablet, Rfl: 0   insulin degludec (TRESIBA FLEXTOUCH) 100 UNIT/ML FlexTouch Pen, Inject 32 Units into the skin at bedtime. Patient receives via [name] Patient Assistance through December 2023, Disp: , Rfl:    Insulin Pen Needle (TRUEPLUS PEN NEEDLES) 31G X 6 MM MISC, Use with lantus two times daily., Disp: 100 each, Rfl: 3   INSULIN SYRINGE 1CC/29G 29G X 1/2" 1 ML MISC, For lantus injections twice daily, Disp: 200 each, Rfl: 1   losartan (COZAAR) 25 MG tablet, TAKE 1 TABLET BY MOUTH EVERY DAY, Disp: 90 tablet, Rfl: 1   metoprolol succinate (TOPROL-XL) 50 MG 24 hr tablet, Take 1 tablet (50 mg total) by mouth daily., Disp: 90 tablet, Rfl: 1   montelukast (SINGULAIR) 10 MG tablet,  Take 1 tablet (10 mg total) by mouth at bedtime., Disp: 30 tablet, Rfl: 3   Multiple Vitamins-Minerals (PRESERVISION AREDS 2 PO), Take 1 tablet by mouth in the morning and at bedtime., Disp: , Rfl:    nitrofurantoin, macrocrystal-monohydrate, (MACROBID) 100 MG capsule, Take 1 capsule (100 mg total) by mouth 2 (two) times daily., Disp: 14 capsule, Rfl: 0   Omega-3 1000 MG CAPS, Take 1,000 mg by mouth daily., Disp: , Rfl:    omeprazole (PRILOSEC) 40 MG capsule, Take 1 capsule (40 mg total) by mouth at bedtime., Disp: 90 capsule, Rfl: 1   OneTouch Delica Lancets 13Y MISC, To check blood sugar daily  DX: E11.9, Disp: 100 each, Rfl: 5   ONETOUCH VERIO test strip, TO CHECK BLOOD SUGAR ONCE DAILY., Disp: 100 strip, Rfl: 11   oxybutynin (DITROPAN-XL) 10 MG 24 hr tablet, Take 1 tablet (10 mg total) by mouth at bedtime., Disp: 90 tablet, Rfl: 1   Semaglutide,0.25 or 0.'5MG'$ /DOS, (OZEMPIC, 0.25 OR 0.5 MG/DOSE,) 2 MG/3ML SOPN, Inject 0.5 mg into the skin once a week. Patient receives via [name] Patient Assistance through December 2023, Disp: 3 mL, Rfl:    sertraline (ZOLOFT) 25 MG tablet, Take 1 tablet (25 mg total) by mouth daily., Disp: 90 tablet, Rfl: 1   Vitamin D, Ergocalciferol, (DRISDOL) 1.25 MG (50000 UNIT)  CAPS capsule, Take 1 capsule (50,000 Units total) by mouth every 7 (seven) days., Disp: 12 capsule, Rfl: 1  Cognitive Assessment Identity Confirmed: : Name; DOB Cognitive Status: Normal   Functional Assessment Hearing Difficulty or Deaf: no Wear Glasses or Blind: yes Vision Management: Reading glasses Concentrating, Remembering or Making Decisions Difficulty (CP): no Difficulty Communicating: no Difficulty Eating/Swallowing: no Walking or Climbing Stairs Difficulty: yes Walking or Climbing Stairs: ambulation difficulty, requires equipment Mobility Management: Engineer, manufacturing systems Dressing/Bathing Difficulty: no Doing Errands Independently Difficulty (such as shopping) (CP):  yes Errands Management: Reports daughter assistw with errands Change in Functional Status Since Onset of Current Illness/Injury: no   Caregiver Assessment  Primary Source of Support/Comfort: child(ren) People in Home: alone Family Caregiver if Needed: none   Planned Interventions DM Reviewed medications and discussed importance of medication adherence. Reports taking all medications as prescribed. Denies concerns related to medication management. Agreed to keep the CCM Pharmacist updated of concerns r/t prescription cost. Discussed importance of blood glucose monitoring. Reports fasting blood sugar levels have ranged from 100-161. Reading today was 140 mg/dl.  Discussed information regarding s/sx of hypoglycemia and hyperglycemia along with recommended interventions. Denies recent hypoglycemic or hyperglycemic episodes.  Discussed nutritional intake and importance of complying with a diabetic diet. Attempting to adhere to recommended diet. Reports family is available to assist with meals as needed. Discussed importance of increasing intake of vegetables and lean proteins. Advised to monitor intake of carbohydrates and avoid foods and beverages with added sugar. Declines current need for additional nutritional resources. Discussed importance of completing recommended DM preventive care. Reports completing foot care. Reports completing eye exam within the last year. Will be due for an updated exam within the next few months. Advised to contact Optometrist to schedule an exam. Discussed importance of completing ordered labs    HTN Reviewed current treatment plan related to Hypertension, self-management, and adherence to plan as established by provider.  Reviewed medications and indications for use. Reports having all needed medications and taking as prescribed. Denies concerns related to medication management. Provided information regarding established blood pressure parameters along with  indications for notifying a provider. Reports not monitoring consistently at home. Has a blood pressure monitor. Advised to monitor a few times a week if unable to monitor daily. Advised to keep a log. Advised to keep a log. Reviewed symptoms. Denies chest pain or palpitations. Denies headaches, dizziness, or visual changes.  Discussed compliance with recommended cardiac prudent diet. Encouraged to read nutrition labels, continue monitoring sodium intake, and avoid highly processed foods when possible.  Reviewed s/sx of heart attack, stroke and worsening symptoms that require immediate medical attention.  Interaction and coordination with outside resources, practitioners, and providers See CCM Referral  Care Plan: Available in MyChart

## 2022-03-17 NOTE — Patient Instructions (Addendum)
Thank you for allowing the Chronic Care Management team to participate in your care. It was great speaking with you today!  Our next outreach is scheduled for May 04, 2022 at 0930. Please do not hesitate to contact me if you require assistance prior to our next outreach. Please call the care guide team at [phone] if you need to cancel or reschedule your appointment.   Following is a copy of the CCM Program Consent:  CCM service includes personalized support from designated clinical staff supervised by the physician, including individualized plan of care and coordination with other care providers 24/7 contact phone numbers for assistance for urgent and routine care needs. Service will only be billed when office clinical staff spend 20 minutes or more in a month to coordinate care. Only one practitioner may furnish and bill the service in a calendar month. The patient may stop CCM services at amy time (effective at the end of the month) by phone call to the office staff. The patient will be responsible for cost sharing (co-pay) or up to 20% of the service fee (after annual deductible is met)  Following is a copy of your full provider care plan:        Ms. Andrea [name] understanding of instructions and care plan provided today. Agrees to view in MyChart.    A member of the care management team will follow up next month.

## 2022-03-19 ENCOUNTER — Telehealth: Payer: Self-pay

## 2022-03-19 ENCOUNTER — Other Ambulatory Visit: Payer: Self-pay | Admitting: Family Medicine

## 2022-03-19 ENCOUNTER — Telehealth: Payer: Self-pay | Admitting: Family Medicine

## 2022-03-19 DIAGNOSIS — E1169 Type 2 diabetes mellitus with other specified complication: Secondary | ICD-10-CM

## 2022-03-19 NOTE — Telephone Encounter (Signed)
Copied from [place] [id number]. Topic: General - Other >> Mar 19, 2022  9:54 AM [place] B wrote: Reason for CRM: Patient called in states she received a letter form [place] stating the Ozempic is being discontinued.

## 2022-03-19 NOTE — Telephone Encounter (Signed)
Medication Refill - Medication: insulin degludec (TRESIBA FLEXTOUCH) 100 UNIT/ML FlexTouch Pen   Pt states that she pulled out her last pen this morning and will only last her 3 days.   Has the patient contacted their pharmacy? Yes.    Pt need to reach out to her PCP.  (Agent: If yes, when and what did the pharmacy advise?)  Preferred Pharmacy (with phone number or street name):  [facility] - [place], [place] - [address] Phone: [phone]  Fax: [phone]     Has the patient been seen for an appointment in the last year OR does the patient have an upcoming appointment? Yes.    Agent: Please be advised that RX refills may take up to 3 business days. We ask that you follow-up with your pharmacy.

## 2022-03-19 NOTE — Telephone Encounter (Signed)
Requested medication (s) are due for refill today: routing for review  Requested medication (s) are on the active medication list: yes  Last refill:  10/15/21  Future visit scheduled: yes  Notes to clinic:  . Patient receives via [name] Patient Assistance through December 2023, routing for review      Requested Prescriptions  Pending Prescriptions Disp Refills   insulin degludec (TRESIBA FLEXTOUCH) 100 UNIT/ML FlexTouch Pen      Sig: Inject 32 Units into the skin at bedtime. Patient receives via [name] Patient Assistance through December 2023     Endocrinology:  Diabetes - Insulins Passed - 03/19/2022  2:31 PM      Passed - HBA1C is between 0 and 7.9 and within 180 days    Hemoglobin A1C  Date Value Ref Range Status  06/03/2021 11.3  Final   Hgb A1c MFr Bld  Date Value Ref Range Status  03/10/2022 7.6 (H) 4.8 - 5.6 % Final    Comment:             Prediabetes: 5.7 - 6.4          Diabetes: >6.4          Glycemic control for adults with diabetes: <7.0          Passed - Valid encounter within last 6 months    Recent Outpatient Visits           1 week ago Type 2 diabetes mellitus with diabetic polyneuropathy, without long-term current use of insulin [facility])   [facility] [name] T, FNP   3 months ago Type 2 diabetes mellitus with hyperlipidemia [facility])   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

## 2022-03-19 NOTE — Telephone Encounter (Signed)
Copied from [place] [phone]. Topic: General - Other >> Mar 18, 2022  1:07 PM [place] S[place] wrote: Reason for CRM: Pt called requesting to speak to [facility] about her ozempic, please advise   [phone]

## 2022-03-23 ENCOUNTER — Telehealth: Payer: Self-pay

## 2022-03-23 DIAGNOSIS — E1169 Type 2 diabetes mellitus with other specified complication: Secondary | ICD-10-CM

## 2022-03-23 DIAGNOSIS — E1142 Type 2 diabetes mellitus with diabetic polyneuropathy: Secondary | ICD-10-CM

## 2022-03-23 NOTE — Progress Notes (Signed)
Care Management & Coordination Services Pharmacy Team  Name: Andrea [name]  MRN: [id number] DOB: 1945/06/08  Reason for Encounter: Medication Coordination and Delivery for [facility]  Contacted patient on 03/23/2022 to discuss medications   Recent office visits:  03/10/2022 Andrea [name], [name] (PCP Office Visit) for Follow-up- No medication changes noted, Lab orders placed, patient to follow-up in 3 months  Recent consult visits:  None ID  Hospital visits:  None in previous 6 months  Medications: Outpatient Encounter Medications as of 03/23/2022  Medication Sig   Alcohol Swabs (ALCOHOL PADS) 70 % PADS 1 each by Does not apply route in the morning and at bedtime.   atorvastatin (LIPITOR) 20 MG tablet Take 1 tablet (20 mg total) by mouth at bedtime.   Calcium Carbonate-Vitamin D 600-400 MG-UNIT tablet Take 1 tablet by mouth daily.   clobetasol (TEMOVATE) 0.05 % external solution Apply 1 application. topically 2 (two) times daily.   Cyanocobalamin (B-12) 1000 MCG CAPS Take 1,000 mcg by mouth daily.   dapagliflozin propanediol (FARXIGA) 10 MG TABS tablet TAKE ONE TABLET BY MOUTH ONCE DAILY BEFORE BREAKFAST   furosemide (LASIX) 20 MG tablet Take 1 tablet (20 mg total) by mouth daily.   hydrOXYzine (ATARAX) 25 MG tablet TAKE 1 TABLET BY MOUTH THREE TIMES A DAY AS NEEDED   insulin degludec (TRESIBA FLEXTOUCH) 100 UNIT/ML FlexTouch Pen Inject 32 Units into the skin at bedtime. Patient receives via [name] Patient Assistance through December 2023   Insulin Pen Needle (TRUEPLUS PEN NEEDLES) 31G X 6 MM MISC Use with lantus two times daily.   INSULIN SYRINGE 1CC/29G 29G X 1/2" 1 ML MISC For lantus injections twice daily   losartan (COZAAR) 25 MG tablet TAKE 1 TABLET BY MOUTH EVERY DAY   metoprolol succinate (TOPROL-XL) 50 MG 24 hr tablet Take 1 tablet (50 mg total) by mouth daily.   montelukast (SINGULAIR) 10 MG tablet Take 1 tablet (10 mg total) by mouth at bedtime.   Multiple  Vitamins-Minerals (ICAPS AREDS 2 PO) Take by mouth.   Multiple Vitamins-Minerals (PRESERVISION AREDS 2 PO) Take 1 tablet by mouth in the morning and at bedtime.   nitrofurantoin, macrocrystal-monohydrate, (MACROBID) 100 MG capsule Take 1 capsule (100 mg total) by mouth 2 (two) times daily.   Omega-3 1000 MG CAPS Take 1,000 mg by mouth daily.   omeprazole (PRILOSEC) 40 MG capsule Take 1 capsule (40 mg total) by mouth at bedtime.   OneTouch Delica Lancets 66Y MISC To check blood sugar daily  DX: E11.9   ONETOUCH VERIO test strip TO CHECK BLOOD SUGAR ONCE DAILY.   oxybutynin (DITROPAN-XL) 10 MG 24 hr tablet Take 1 tablet (10 mg total) by mouth at bedtime.   Semaglutide,0.25 or 0.'5MG'$ /DOS, (OZEMPIC, 0.25 OR 0.5 MG/DOSE,) 2 MG/3ML SOPN Inject 0.5 mg into the skin once a week. Patient receives via [name] Patient Assistance through December 2023   sertraline (ZOLOFT) 25 MG tablet Take 1 tablet (25 mg total) by mouth daily.   Vitamin D, Ergocalciferol, (DRISDOL) 1.25 MG (50000 UNIT) CAPS capsule Take 1 capsule (50,000 Units total) by mouth every 7 (seven) days.   No facility-administered encounter medications on file as of 03/23/2022.   BP Readings from Last 3 Encounters:  03/10/22 (!) 122/55  12/10/21 (!) 127/56  09/03/21 101/63    Pulse Readings from Last 3 Encounters:  03/10/22 92  12/10/21 91  09/03/21 90    Lab Results  Component Value Date/Time   HGBA1C 7.6 (H) 03/10/2022 11:38  AM   HGBA1C 8.6 (H) 12/10/2021 11:37 AM   HGBA1C 11.3 06/03/2021 04:44 PM   HGBA1C 7.5 (H) 11/26/2013 04:14 AM   Lab Results  Component Value Date   CREATININE 1.32 (H) 03/10/2022   BUN 29 (H) 03/10/2022   GFRNONAA 46 (L) 12/22/2020   GFRAA 70 02/11/2020   NA 144 03/10/2022   K 4.7 03/10/2022   CALCIUM 10.1 03/10/2022   CO2 24 03/10/2022   Last adherence delivery date: 03/03/2022       Patient is due for next adherence delivery on: 04/01/2022 1st Route  Spoke with patient on 03/24/2022 and  reviewed medications. Patient denied the need for any medications at this time.  This delivery to include: Adherence Packaging  30 Days  Atorvastatin 20 mg 1 tablet daily (Bedtime) Furosemide 20 mg 1 tablet daily (Breakfast) Losartan 25 mg 1 tablet daily (Breakfast) Metoprolol XL 50 mg 1 tablet daily (Breakfast) Omeprazole  40 mg 1 tablet daily (Breakfast) Oxybutynin XL 10 mg 1 tablet daily (Bedtime) Montelukast 10 mg 1 tablet daily (Bedtime) Vitamin D 50,000 U 1 tablet weekly (on Fridays) Sertraline 25 mg 1 tablet daily (Breakfast)  Patient declined the following medications this month: No medications declined for the month of January 2024  Refills requested from providers include: Montelukast 10 mg (PCP Medication)  Confirmed delivery date of 04/01/2022 1st Route, advised patient that pharmacy will contact them the morning of delivery.  Any concerns about your medications? No  How often do you forget or accidentally miss a dose? Never  Do you use a pillbox? No  Is patient in packaging Yes   Recent blood pressure readings are as follows:122/55  Cycle dispensing form sent to [name], CPP for review and refill.  I contacted [name] regarding the denial of patient's Ozempic, and [name]. Although there was an extensive hold time I was finally able to speak with a representative who advised that the patient was actually approved for her Ozempic and her Tresiba until 03/08/2023. Per the representative they are in the process off sending out her first shipment.  I tried calling the patient to provider her with the update, but I had to leave a voicemail requesting patient to return my call.  01/17 @ [phone] I spoke with the patient and informed her that she was approved for her Ozempic and her [name]. I informed the patient that they were processing her shipment so she should be getting medication soon. The patient confirms that she does have enough medication for now so she  should be okay until next week.   Patent also informed me that she received a letter stating that she now has full LIS. She is going to have her daughter bring in a copy when her medications from [place] is ready for pickup.  Patient has no other concerns or issues at this time.   Patient has a follow-up appointment with [name], CPP on 03/30/2022 @ 1100.  Lynann Bologna, CPA/CMA Clinical Pharmacist Assistant Phone: [phone]

## 2022-03-24 MED ORDER — TRESIBA FLEXTOUCH 100 UNIT/ML ~~LOC~~ SOPN: 32.0000 [IU] | PEN_INJECTOR | Freq: Every day | SUBCUTANEOUS | Status: DC

## 2022-03-24 MED ORDER — MONTELUKAST SODIUM 10 MG PO TABS: 10.0000 mg | ORAL_TABLET | Freq: Every day | ORAL | 1 refills | Status: DC

## 2022-03-24 MED ORDER — OZEMPIC (0.25 OR 0.5 MG/DOSE) 2 MG/3ML ~~LOC~~ SOPN: 0.5000 mg | PEN_INJECTOR | SUBCUTANEOUS | Status: DC

## 2022-03-24 MED ORDER — DAPAGLIFLOZIN PROPANEDIOL 10 MG PO TABS: 10.0000 mg | ORAL_TABLET | Freq: Every day | ORAL | 1 refills | Status: DC

## 2022-03-24 NOTE — Addendum Note (Signed)
Addended by: Daron Offer A on: 03/24/2022 09:42 AM   Modules accepted: Orders

## 2022-03-30 ENCOUNTER — Ambulatory Visit: Payer: 59

## 2022-03-30 DIAGNOSIS — I5022 Chronic systolic (congestive) heart failure: Secondary | ICD-10-CM

## 2022-03-30 DIAGNOSIS — E1142 Type 2 diabetes mellitus with diabetic polyneuropathy: Secondary | ICD-10-CM

## 2022-03-30 NOTE — Progress Notes (Signed)
Care Management & Coordination Services Pharmacy Note  03/30/2022 Name:  Andrea [name] MRN:  [id number] DOB:  Jun 26, 1945  Summary: Patient presents for follow-up consult.  Patient blood sugars are mostly controlled in the mornings, with occasional spikes when she eats out. She denies hypoglycemia. We discussed possibly utilizing CGM to monitor blood sugars which patient wanted to wait prior to starting.  She is not monitoring blood pressure at home, but denies symptoms of hypotension.   Recommendations/Changes made from today's visit: -Continue current medications  Follow up plan: CPP follow-up 3 months   Subjective: Andrea [name] is an 77 y.o. year old female who is a primary patient of [name], FNP.  The care coordination team was consulted for assistance with disease management and care coordination needs.    Engaged with patient by telephone for follow up visit.  Recent office visits: 03/10/22: Patient presented to [name], FNP for follow-up. A1c 7.6% 12/10/21: Patient presented to [name], FNP for follow-up. A1c 8.6%.   Recent consult visits: None in previous 6 months  Hospital visits: None in previous 6 months   Objective:  Lab Results  Component Value Date   CREATININE 1.32 (H) 03/10/2022   BUN 29 (H) 03/10/2022   EGFR 42 (L) 03/10/2022   GFRNONAA 46 (L) 12/22/2020   GFRAA 70 02/11/2020   NA 144 03/10/2022   K 4.7 03/10/2022   CALCIUM 10.1 03/10/2022   CO2 24 03/10/2022   GLUCOSE 147 (H) 03/10/2022    Lab Results  Component Value Date/Time   HGBA1C 7.6 (H) 03/10/2022 11:38 AM   HGBA1C 8.6 (H) 12/10/2021 11:37 AM   HGBA1C 11.3 06/03/2021 04:44 PM   HGBA1C 7.5 (H) 11/26/2013 04:14 AM   MICROALBUR 20 12/16/2016 03:10 PM    Last diabetic Eye exam:  Lab Results  Component Value Date/Time   HMDIABEYEEXA No Retinopathy 04/08/2021 12:00 AM    Last diabetic Foot exam: No results found for: "HMDIABFOOTEX"   Lab Results  Component Value  Date   CHOL 132 12/10/2021   HDL 58 12/10/2021   LDLCALC 45 12/10/2021   TRIG 180 (H) 12/10/2021   CHOLHDL 2.3 12/10/2021       Latest Ref Rng & Units 03/10/2022   11:38 AM 12/10/2021   11:37 AM 06/03/2021    4:44 PM  Hepatic Function  Total Protein 6.0 - 8.5 g/dL 6.8  6.7    Albumin 3.8 - 4.8 g/dL 4.4  4.3  4.2      AST 0 - 40 IU/L '15  13  15      '$ ALT 0 - 32 IU/L '11  8  10      '$ Alk Phosphatase 44 - 121 IU/L 155  132    Total Bilirubin 0.0 - 1.2 mg/dL 0.6  0.6       This result is from an external source.    Lab Results  Component Value Date/Time   TSH 1.88 06/03/2021 04:44 PM   TSH 1.880 06/02/2021 12:00 AM   TSH 1.220 12/28/2018 04:26 PM   FREET4 1.33 06/02/2021 12:00 AM       Latest Ref Rng & Units 12/20/2020    4:49 AM 12/19/2020    5:45 AM 12/18/2020   10:22 AM  CBC  WBC 4.0 - 10.5 K/uL 7.6  7.0  9.5   Hemoglobin 12.0 - 15.0 g/dL 10.5  10.4  11.4   Hematocrit 36.0 - 46.0 % 32.7  31.8  35.1   Platelets 150 -  400 K/uL 211  189  188     Lab Results  Component Value Date/Time   VITAMINB12 1,375 (H) 11/23/2020 12:13 PM    Clinical ASCVD: Yes  The ASCVD Risk score (Arnett DK, et al., 2019) failed to calculate for the following reasons:   The patient has a prior MI or stroke diagnosis       03/17/2022    9:44 AM 12/10/2021   11:21 AM 06/30/2021    1:04 PM  Depression screen PHQ 2/9  Decreased Interest 0 0 0  Down, Depressed, Hopeless 0 0 0  PHQ - 2 Score 0 0 0  Altered sleeping  0   Tired, decreased energy  0   Change in appetite  0   Feeling bad or failure about yourself   0   Trouble concentrating  0   Moving slowly or fidgety/restless  0   Suicidal thoughts  0   PHQ-9 Score  0   Difficult doing work/chores  Not difficult at all      Social History   Tobacco Use  Smoking Status Former   Packs/day: 1.00   Years: 30.00   Total pack years: 30.00   Types: Cigarettes   Quit date: 07/17/2012   Years since quitting: 9.7  Smokeless Tobacco Never   Tobacco Comments       BP Readings from Last 3 Encounters:  03/10/22 (!) 122/55  12/10/21 (!) 127/56  09/03/21 101/63   Pulse Readings from Last 3 Encounters:  03/10/22 92  12/10/21 91  09/03/21 90   Wt Readings from Last 3 Encounters:  03/10/22 141 lb 6.4 oz (64.1 kg)  12/10/21 141 lb (64 kg)  09/03/21 143 lb 8 oz (65.1 kg)   BMI Readings from Last 3 Encounters:  03/10/22 30.60 kg/m  12/10/21 30.51 kg/m  09/03/21 28.03 kg/m    Allergies  Allergen Reactions   Chlorhexidine    Metformin And Related Diarrhea   Sulfa Antibiotics Rash    Medications Reviewed Today     Reviewed by Neldon Labella, RN (Registered Nurse) on 03/17/22 at [phone]  Med List Status: <None>   Medication Order Taking? Sig Documenting Provider Last Dose Status Informant  Alcohol Swabs (ALCOHOL PADS) 70 % PADS [id number]  1 each by Does not apply route in the morning and at bedtime. [name], FNP  Active   atorvastatin (LIPITOR) 20 MG tablet [id number]  Take 1 tablet (20 mg total) by mouth at bedtime. [name], FNP  Active   Calcium Carbonate-Vitamin D 600-400 MG-UNIT tablet [id number]  Take 1 tablet by mouth daily. [provider]  Active Nursing Home Medication Administration Guide (MAG)  clobetasol (TEMOVATE) 0.05 % external solution [id number]  Apply 1 application. topically 2 (two) times daily. [name], FNP  Active   Cyanocobalamin (B-12) 1000 MCG CAPS [id number]  Take 1,000 mcg by mouth daily. [provider]  Active Nursing Home Medication Administration Guide (MAG)  dapagliflozin propanediol (FARXIGA) 10 MG TABS tablet [id number]  TAKE ONE TABLET BY MOUTH ONCE DAILY BEFORE Darin Engels, FNP  Active   furosemide (LASIX) 20 MG tablet [id number]  Take 1 tablet (20 mg total) by mouth daily. [name], FNP  Active   hydrOXYzine (ATARAX) 25 MG tablet [id number]  TAKE 1 TABLET BY MOUTH THREE TIMES A DAY AS NEEDED [name], FNP  Active   insulin  degludec (TRESIBA FLEXTOUCH) 100 UNIT/ML FlexTouch Pen [id number]  Inject 32 Units into the skin  at bedtime. Patient receives via [name] Patient Assistance through December 2023 [name] T, FNP  Active   Insulin Pen Needle (TRUEPLUS PEN NEEDLES) 31G X 6 MM MISC [id number]  Use with lantus two times daily. Virginia Crews, MD  Active Nursing Home Medication Administration Guide (MAG)  INSULIN SYRINGE 1CC/29G 29G X 1/2" 1 ML MISC [id number]  For lantus injections twice daily Mar Daring, PA-C  Active Nursing Home Medication Administration Guide (MAG)  losartan (COZAAR) 25 MG tablet [id number]  TAKE 1 TABLET BY MOUTH EVERY DAY [name], FNP  Active   metoprolol succinate (TOPROL-XL) 50 MG 24 hr tablet [id number]  Take 1 tablet (50 mg total) by mouth daily. [name], FNP  Active   montelukast (SINGULAIR) 10 MG tablet [id number]  Take 1 tablet (10 mg total) by mouth at bedtime. [name], FNP  Active   Multiple Vitamins-Minerals (ICAPS AREDS 2 PO) [id number] Yes Take by mouth. [provider]  Active   Multiple Vitamins-Minerals (PRESERVISION AREDS 2 PO) [id number]  Take 1 tablet by mouth in the morning and at bedtime. [provider]  Active Nursing Home Medication Administration Guide (MAG)  nitrofurantoin, macrocrystal-monohydrate, (MACROBID) 100 MG capsule [id number]  Take 1 capsule (100 mg total) by mouth 2 (two) times daily. [name] T, FNP  Active   Omega-3 1000 MG CAPS [id number]  Take 1,000 mg by mouth daily. [provider]  Active Nursing Home Medication Administration Guide (MAG)  omeprazole (PRILOSEC) 40 MG capsule [id number]  Take 1 capsule (40 mg total) by mouth at bedtime. [name], FNP  Active   OneTouch Delica Lancets 27X [place] [id number]  To check blood sugar daily  DX: E11.9 [name], FNP  Active   [facility] VERIO test strip [id number]  TO CHECK BLOOD SUGAR ONCE DAILY. [name], FNP  Active   oxybutynin  (DITROPAN-XL) 10 MG 24 hr tablet [id number]  Take 1 tablet (10 mg total) by mouth at bedtime. [name], FNP  Active   Semaglutide,0.25 or 0.'5MG'$ /DOS, (OZEMPIC, 0.25 OR 0.5 MG/DOSE,) 2 MG/3ML SOPN [id number]  Inject 0.5 mg into the skin once a week. Patient receives via [name] Patient Assistance through December 2023 [name] T, FNP  Active            Med Note Michaelle Birks, Cathe Mons A   Fri Nov 13, 2021  9:36 AM)    sertraline (ZOLOFT) 25 MG tablet [id number]  Take 1 tablet (25 mg total) by mouth daily. [name], FNP  Active   Vitamin D, Ergocalciferol, (DRISDOL) 1.25 MG (50000 UNIT) CAPS capsule [id number]  Take 1 capsule (50,000 Units total) by mouth every 7 (seven) days. [name], FNP  Active   Med List Note Loann Quill, CPhT 12/18/20 1047): [employer] [phone]            SDOH:  (Social Determinants of Health) assessments and interventions performed: Yes SDOH Interventions    Flowsheet Row Chronic Care Management from 03/17/2022 in [place] Most recent reading at 03/17/2022 10:12 AM Chronic Care Management from 06/23/2021 in [place] Most recent reading at 07/06/2021 10:27 AM Clinical Support from 06/30/2021 in [place] Most recent reading at 06/30/2021  1:05 PM Chronic Care Management from 11/17/2020 in [place] Most recent reading at 11/17/2020  3:05 PM Office Visit from 12/26/2019 in [facility] Most recent  reading at 12/26/2019 11:13 AM  SDOH Interventions       Food Insecurity Interventions Intervention Not Indicated -- Intervention Not Indicated Intervention Not Indicated --  Housing Interventions Intervention Not Indicated -- Intervention Not Indicated -- --  Transportation Interventions Intervention Not Indicated Intervention Not Indicated Intervention Not Indicated [id number] Referral --  Utilities  Interventions Intervention Not Indicated -- -- -- --  Alcohol Usage Interventions Intervention Not Indicated (Score <7) -- -- -- --  Depression Interventions/Treatment  -- -- -- -- Medication  Financial Strain Interventions Intervention Not Indicated Other (Comment)  [PAP] Intervention Not Indicated -- --  Physical Activity Interventions Other (Comments)  [Declined need for activity resources] -- Intervention Not Indicated -- --  Stress Interventions Intervention Not Indicated -- Intervention Not Indicated -- --  Social Connections Interventions Other (Comment)  [Declined need for community resources] -- Intervention Not Indicated -- --       Medication Assistance:  Joni Reining obtained through [name] medication assistance program.  Enrollment ends Dec 2024  Medication Access: Within the past 30 days, how often has patient missed a dose of medication? No Is a pillbox or other method used to improve adherence? Yes  Factors that may affect medication adherence? no barriers identified Are meds synced by current pharmacy? Yes  Are meds delivered by current pharmacy? Yes  Does patient experience delays in picking up medications due to transportation concerns? No   [facility] Reviewed: Current Rx insurance plan: [employer] Name and location of Current pharmacy:   Compliance/Adherence/Medication fill history: Care Gaps: Tetanus/TDAP Dexa Scan Diabetic Foot Exam  Star-Rating Drugs: Atorvastatin 20 mg last filled on 03/29/22 for a 30-Day supply with [facility] Farxiga 10 mg last filled on 03/29/22 for a 30-Day supply with [facility] Losartan 25 mg last filled on 03/29/22 for a 30-Day supply with [facility]   Assessment/Plan  Heart Failure (Goal: manage symptoms and prevent exacerbations) -Controlled -Last ejection fraction: 40-45% (Date: Jan 2023) -HF type: Systolic -NYHA Class: III (marked limitation of activity) -Current treatment: Farxiga 10 mg  daily: Appropriate, Effective, Safe, Accessible  Furosemide 20 mg daily: Appropriate, Effective, Safe, Accessible Losartan 25 mg daily: Appropriate, Effective, Safe, Accessible  Metoprolol XL 50 mg daily: Appropriate, Effective, Safe, Accessible  -Medications previously tried: NA  -Current home BP/HR readings: Not monitoring -Continue current medications  Hyperlipidemia: (LDL goal < 55) -Controlled -History of PAD, CAD, TIA -Current treatment: Atorvastatin 20 mg daily: Appropriate, Effective, Safe, Accessible  -Medications previously tried: NA  -Recommended to continue current medication  Diabetes (A1c goal <8%) -Controlled -Current medications: Farxiga 10 mg daily: Appropriate, Effective, Safe, Accessible  Tresiba 32 units daily (0.5 u/kg): Appropriate, Effective, Safe, Accessible Ozempic 0.5 mg weekly: Appropriate, Effective, Safe, Accessible  -Medications previously tried: Jardiance (Yeast Infection) -Current home glucose readings:   Fasting Notes  23-Jan 119   22-Jan 106   21-Jan 107   20-Jan 185 Restaurant   19-Jan 143   18-Jan 98   17-Jan 133   16-Jan 116   15-Jan 151   14-Jan 141   13-Jan 94   Average 127    -Reports hyperglycemic symptoms: polydipsia  -Current dietary Patterns: Cut out concentrated carbohydrates, eating more cooked vegetables. Drinks only coffee and unsweetened tea throughout the day.  -Patient blood sugars are mostly controlled in the mornings, with occasional spikes when she eats out. She denies hypoglycemia. We discussed possibly utilizing CGM to monitor blood sugars which patient wanted to wait prior to starting. -Continue current medications  Anxiety / Insomnia (Goal: Maintain symptom remission) -Controlled: Not addressed during this visit. -Current treatment: Hydroxyzine 25 mg three times daily as needed: Appropriate, Effective, Safe, Accessible Sertraline 25 mg daily: Appropriate, Effective, Safe, Accessible  -Medications previously  tried/failed: NA -GAD7: 8 -Enjoys puzzles, word searches  -Continue current medications   Osteoporosis / Osteopenia (Goal Prevent fractures) -Uncontrolled: Not addressed during this visit. -Patient is a candidate for pharmacologic treatment due to T-Score < -2.5 in femoral neck -Current treatment  Calcium-Vitamin D twice daily: Appropriate, Effective, Safe, Accessible  Ergocalciferol 50,000 units weekly: Appropriate, Effective, Safe, Accessible  -Medications previously tried: NA  -Recommended to continue current medication  GERD (Goal: Prevent reflux) -Controlled: Not addressed during this visit. -Current treatment  Omeprazole 40 mg daily  -Medications previously tried: NA  -Excellent symptom control, rebound symptoms if skipping a day.  -Recommended to continue current medication  Overactive Bladder (Goal: Minimize symptoms) -Controlled: Not addressed during this visit. -Current treatment  Oxybutynin XL 10 mg daily  -Medications previously tried: NA -Waking up 2-3 times nightly, much better controlled with oxybutynin.  -Will see if symptom changes as hyperglycemia improves.   -Recommended to continue current medication  [name], PharmD, [place], [place] Pharmacist Practitioner  [facility] [phone]

## 2022-04-01 ENCOUNTER — Telehealth: Payer: Self-pay

## 2022-04-01 NOTE — Telephone Encounter (Signed)
Patient advised we do have medications for patient in our office.

## 2022-04-01 NOTE — Telephone Encounter (Signed)
Copied from [place] [id number]. Topic: General - Inquiry >> Apr 01, 2022  1:09 PM Rosanne Ashing P wrote: Reason for CRM: pt called wanting to know if she has any medications to pick up at the office.    CB#  [phone]

## 2022-04-07 DIAGNOSIS — I10 Essential (primary) hypertension: Secondary | ICD-10-CM

## 2022-04-07 DIAGNOSIS — E1159 Type 2 diabetes mellitus with other circulatory complications: Secondary | ICD-10-CM | POA: Diagnosis not present

## 2022-04-20 ENCOUNTER — Telehealth: Payer: Self-pay

## 2022-04-20 DIAGNOSIS — E1142 Type 2 diabetes mellitus with diabetic polyneuropathy: Secondary | ICD-10-CM

## 2022-04-20 NOTE — Progress Notes (Signed)
Care Management & Coordination Services Pharmacy Team Pharmacy Assistant   Name: Andrea [name]  MRN: [id number] DOB: December 06, 1945  Reason for Encounter: Medication Coordination and Delivery for [facility]/Schedule follow-up appointment  Chart review: Recent office visits:  None ID  Recent consult visits:  None ID  Hospital visits:  None in previous 6 months  Medications: Outpatient Encounter Medications as of 04/20/2022  Medication Sig   Alcohol Swabs (ALCOHOL PADS) 70 % PADS 1 each by Does not apply route in the morning and at bedtime.   atorvastatin (LIPITOR) 20 MG tablet Take 1 tablet (20 mg total) by mouth at bedtime.   Calcium Carbonate-Vitamin D 600-400 MG-UNIT tablet Take 1 tablet by mouth daily.   clobetasol (TEMOVATE) 0.05 % external solution Apply 1 application. topically 2 (two) times daily.   Cyanocobalamin (B-12) 1000 MCG CAPS Take 1,000 mcg by mouth daily.   dapagliflozin propanediol (FARXIGA) 10 MG TABS tablet Take 1 tablet (10 mg total) by mouth daily. TAKE ONE TABLET BY MOUTH ONCE DAILY BEFORE BREAKFAST   furosemide (LASIX) 20 MG tablet Take 1 tablet (20 mg total) by mouth daily.   hydrOXYzine (ATARAX) 25 MG tablet TAKE 1 TABLET BY MOUTH THREE TIMES A DAY AS NEEDED   insulin degludec (TRESIBA FLEXTOUCH) 100 UNIT/ML FlexTouch Pen Inject 32 Units into the skin at bedtime. Patient receives via [name] Patient Assistance through December 2024   Insulin Pen Needle (TRUEPLUS PEN NEEDLES) 31G X 6 MM MISC Use with lantus two times daily.   INSULIN SYRINGE 1CC/29G 29G X 1/2" 1 ML MISC For lantus injections twice daily   losartan (COZAAR) 25 MG tablet TAKE 1 TABLET BY MOUTH EVERY DAY   metoprolol succinate (TOPROL-XL) 50 MG 24 hr tablet Take 1 tablet (50 mg total) by mouth daily.   montelukast (SINGULAIR) 10 MG tablet Take 1 tablet (10 mg total) by mouth at bedtime.   Multiple Vitamins-Minerals (ICAPS AREDS 2 PO) Take by mouth.   Multiple Vitamins-Minerals  (PRESERVISION AREDS 2 PO) Take 1 tablet by mouth in the morning and at bedtime.   Omega-3 1000 MG CAPS Take 1,000 mg by mouth daily.   omeprazole (PRILOSEC) 40 MG capsule Take 1 capsule (40 mg total) by mouth at bedtime.   OneTouch Delica Lancets [id number] MISC To check blood sugar daily  DX: E11.9   ONETOUCH VERIO test strip TO CHECK BLOOD SUGAR ONCE DAILY.   oxybutynin (DITROPAN-XL) 10 MG 24 hr tablet Take 1 tablet (10 mg total) by mouth at bedtime.   Semaglutide,0.25 or 0.'5MG'$ /DOS, (OZEMPIC, 0.25 OR 0.5 MG/DOSE,) 2 MG/3ML SOPN Inject 0.5 mg into the skin once a week. Patient receives via [name] Patient Assistance through December 2024   sertraline (ZOLOFT) 25 MG tablet Take 1 tablet (25 mg total) by mouth daily.   Vitamin D, Ergocalciferol, (DRISDOL) 1.25 MG (50000 UNIT) CAPS capsule Take 1 capsule (50,000 Units total) by mouth every 7 (seven) days.   No facility-administered encounter medications on file as of 04/20/2022.   BP Readings from Last 3 Encounters:  03/10/22 (!) 122/55  12/10/21 (!) 127/56  09/03/21 101/63    Pulse Readings from Last 3 Encounters:  03/10/22 92  12/10/21 91  09/03/21 90    Lab Results  Component Value Date/Time   HGBA1C 7.6 (H) 03/10/2022 11:38 AM   HGBA1C 8.6 (H) 12/10/2021 11:37 AM   HGBA1C 11.3 06/03/2021 04:44 PM   HGBA1C 7.5 (H) 11/26/2013 04:14 AM   Lab Results  Component Value Date  CREATININE 1.32 (H) 03/10/2022   BUN 29 (H) 03/10/2022   GFRNONAA 46 (L) 12/22/2020   GFRAA 70 02/11/2020   NA 144 03/10/2022   K 4.7 03/10/2022   CALCIUM 10.1 03/10/2022   CO2 24 03/10/2022   Contacted patient to discuss medications and coordinate delivery from [place]. Spoke with patient on 04/20/2022   Cycle dispensing form sent to [name], CPP for review.   Last adherence delivery date: 03/23/2022      Patient is due for next adherence delivery on: 05/03/2022 1st Route  This delivery to include: Adherence Packaging  30 Days   Atorvastatin 20 mg 1 tablet daily (Bedtime) Furosemide 20 mg 1 tablet daily (Breakfast) Losartan 25 mg 1 tablet daily (Breakfast) Metoprolol XL 50 mg 1 tablet daily (Breakfast) Omeprazole  40 mg 1 tablet daily (Breakfast) Oxybutynin XL 10 mg 1 tablet daily (Bedtime) Montelukast 10 mg 1 tablet daily (Bedtime) Vitamin D 50,000 U 1 tablet weekly (on Fridays) Sertraline 25 mg 1 tablet daily (Breakfast) Farxiga 10 mg 1 tablet daily (Before Breakfast) OneTouch Delica Lancets OneTouch Test Strips  Patient declined the following medications this month: No medications were denied for the month of February  Refills requested from providers include: Furosemide 20 mg, Metoprolol XL 50 mg, Oxybutynin XL 10 mg  Confirmed delivery date of 05/03/2022 1st Route, advised patient that pharmacy will contact them the morning of delivery.  Any concerns about your medications? No  How often do you forget or accidentally miss a dose? Never  Do you use a pillbox? No  Is patient in packaging Yes   I spoke to the patient and she reports that today she is doing well. Patient denies any ill symptoms at this time. Patient stated that she did receive a recent order of her Ozempic. Patient has no concerns or issues at this time   Lynann Bologna, [place] Pharmacist Assistant Phone: [phone]

## 2022-04-21 IMAGING — XA DG HIP (WITH PELVIS) OPERATIVE*L*
6 series · 12 of 12 positions shown · non-contrast
Comparison: None.

CLINICAL DATA: Hardware removal and anterior upper placement

EXAM:
OPERATIVE LEFT HIP (WITH PELVIS IF PERFORMED) 1 VIEW
TECHNIQUE: Fluoroscopic spot image(s) were submitted for interpretation
post-operatively.

[Series 1: ortho standard · 4 of 9 frames shown (1 of 6)]
[frame 1/9]
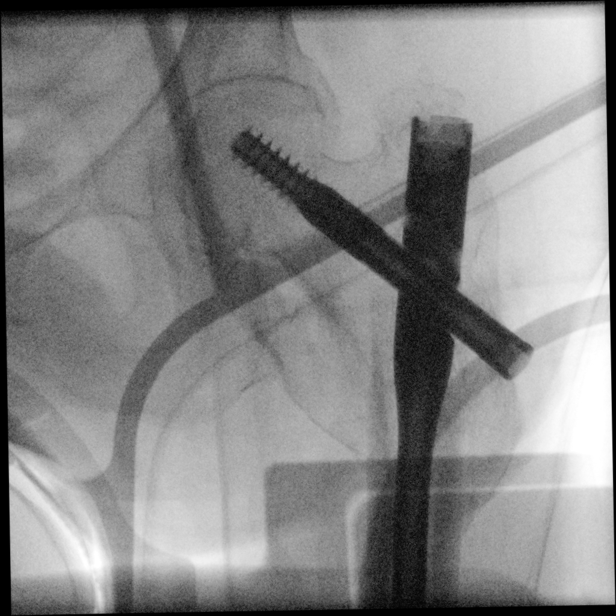
[frame 2/9]
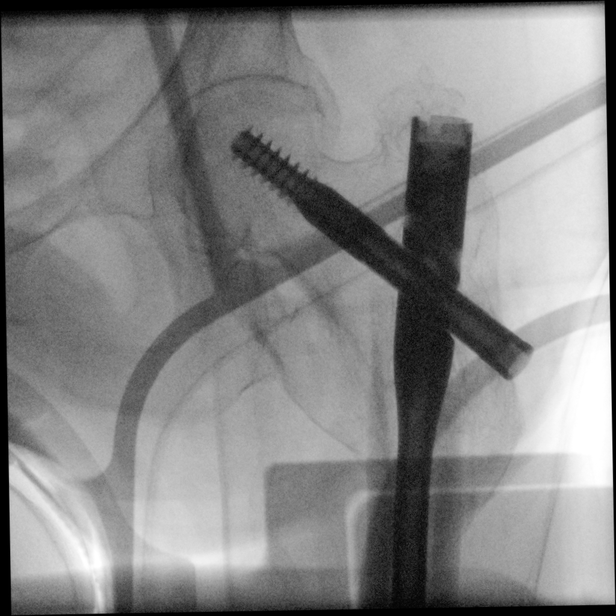
[frame 5/9]
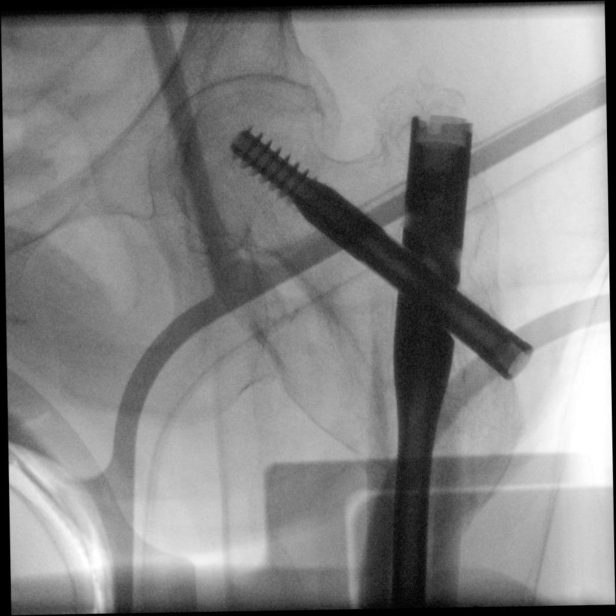
[frame 8/9]
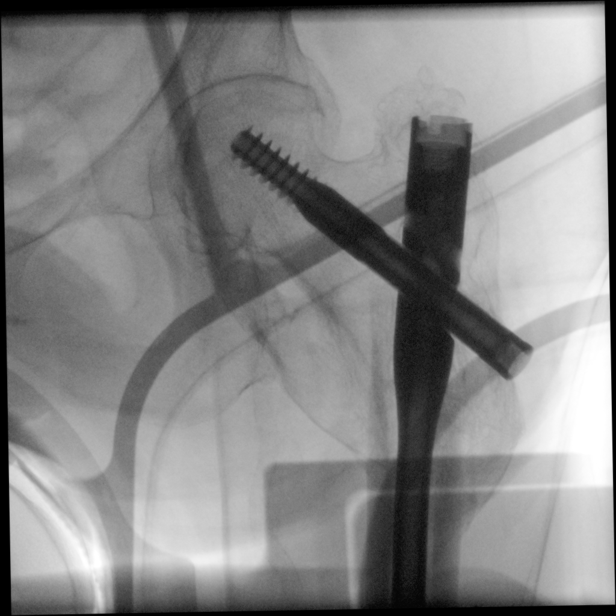

[Series 2: ortho standard · 1 of 1 slices shown (2 of 6)]
[im 1/1]
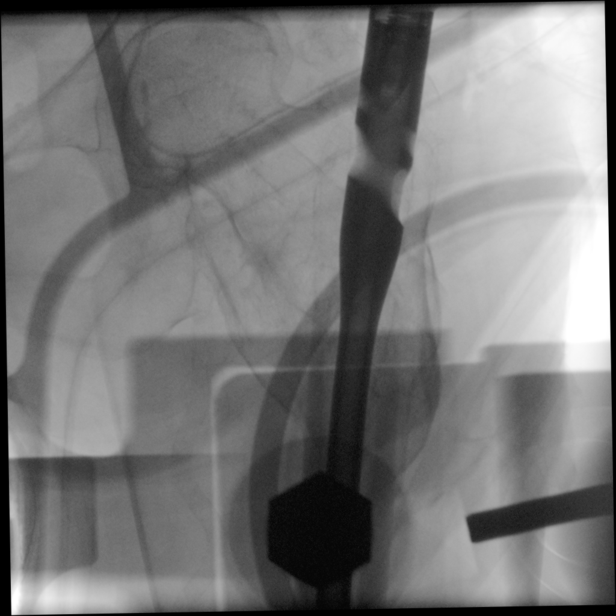

[Series 3: ortho standard · 1 of 1 slices shown (3 of 6)]
[im 1/1]
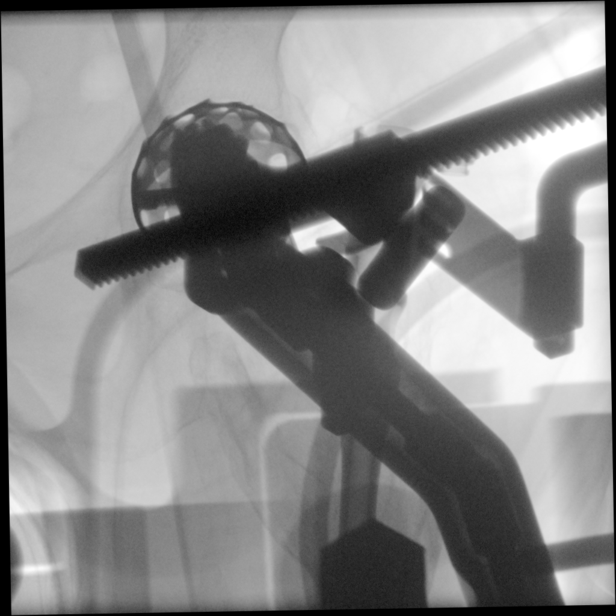

[Series 4: ortho standard · 1 of 1 slices shown (4 of 6)]
[im 1/1]
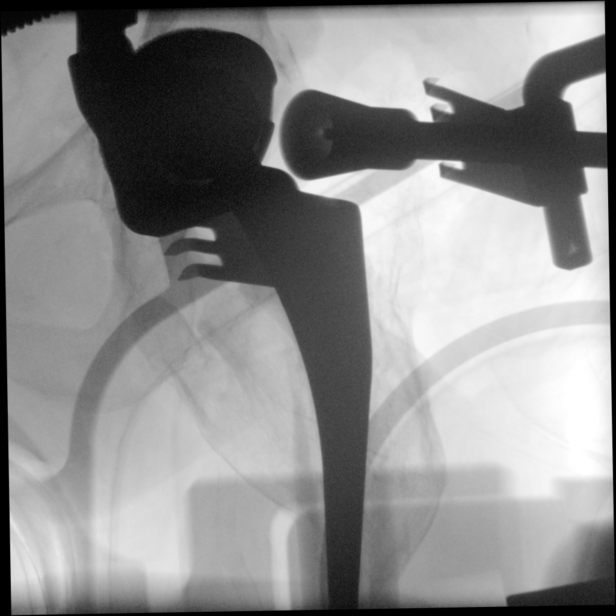

[Series 5: ortho standard · 1 of 1 slices shown (5 of 6)]
[im 1/1]
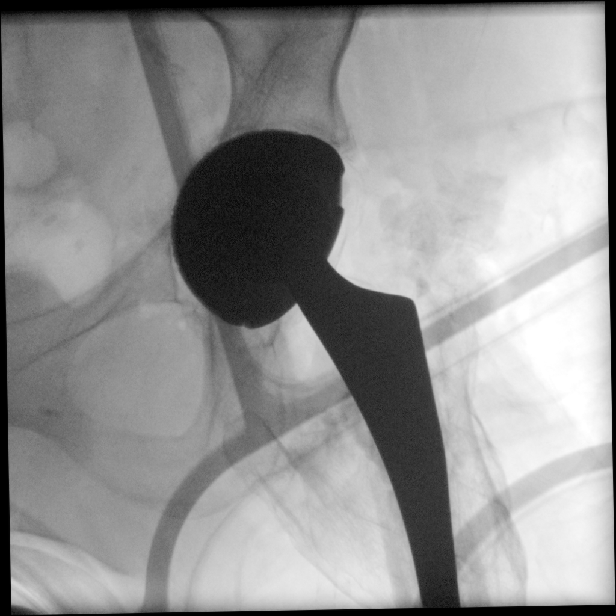

[Series 6: ortho standard · 4 of 10 frames shown (6 of 6)]
[frame 2/10]
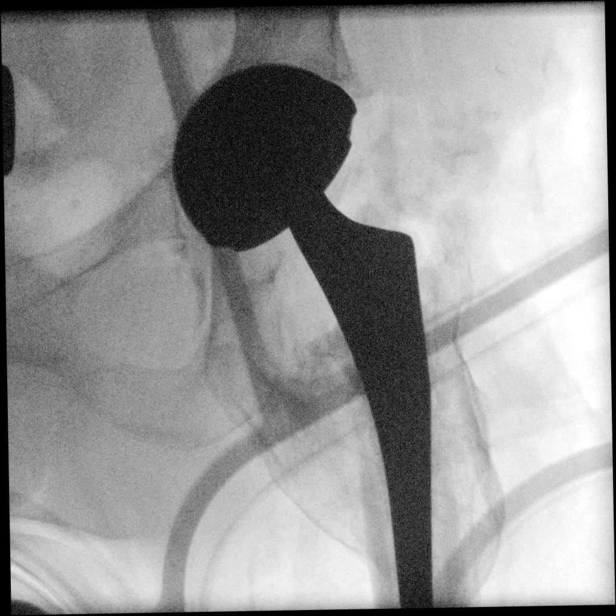
[frame 6/10]
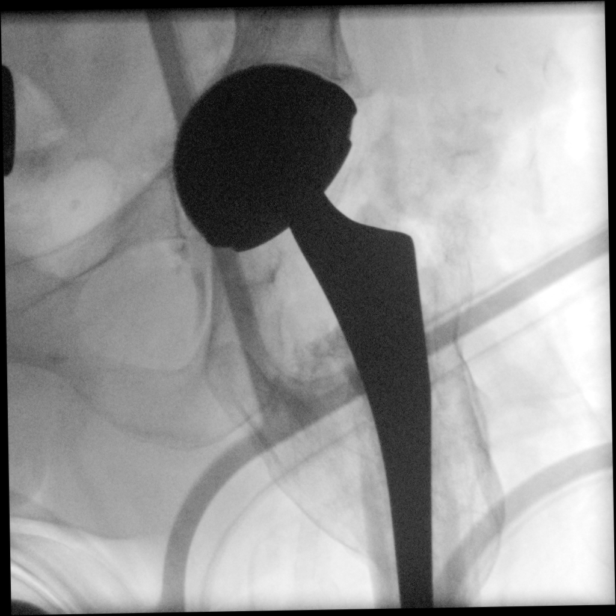
[frame 9/10]
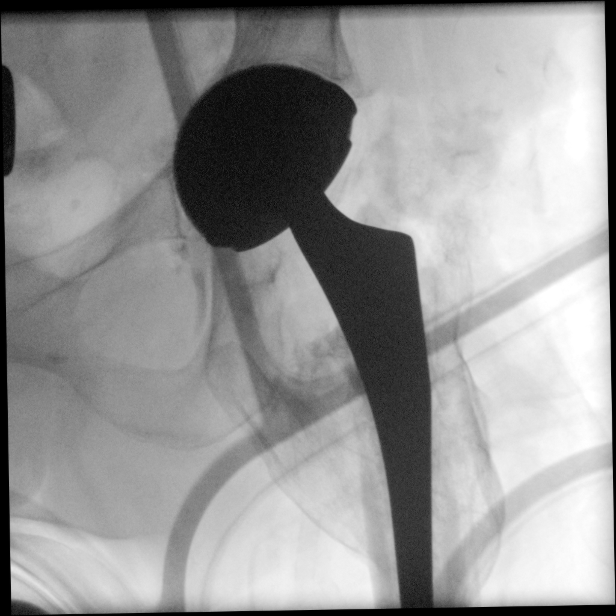
[frame 10/10]
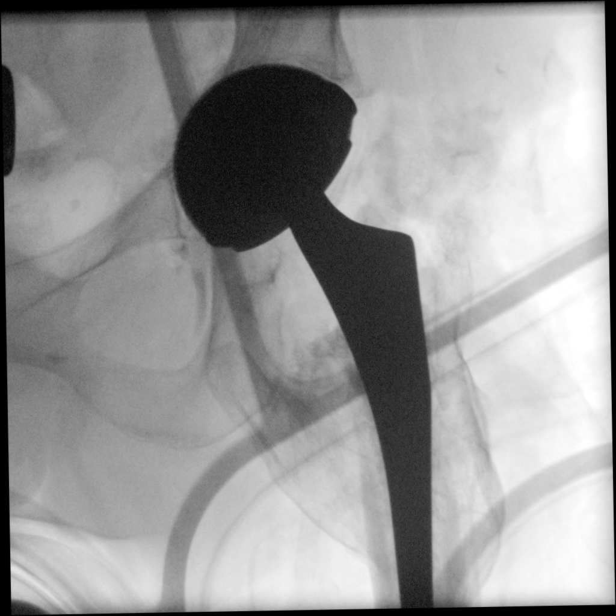

[12 of 12 positions shown; findings below may reference images not displayed]

FINDINGS: Initial image demonstrates intramedullary rod fixation of the left
femur. There is subsequent removal and placement of a total hip
arthroplasty.
IMPRESSION: Post left femur fracture fixation hardware removal with total hip
arthroplasty.

## 2022-04-28 ENCOUNTER — Other Ambulatory Visit: Payer: Self-pay | Admitting: Family Medicine

## 2022-04-28 DIAGNOSIS — I5022 Chronic systolic (congestive) heart failure: Secondary | ICD-10-CM

## 2022-04-28 DIAGNOSIS — N3281 Overactive bladder: Secondary | ICD-10-CM

## 2022-04-29 MED ORDER — ONETOUCH VERIO VI STRP: ORAL_STRIP | 11 refills | Status: DC

## 2022-04-29 NOTE — Addendum Note (Signed)
Addended by: Daron Offer A on: 04/29/2022 03:23 PM   Modules accepted: Orders

## 2022-05-04 ENCOUNTER — Ambulatory Visit (INDEPENDENT_AMBULATORY_CARE_PROVIDER_SITE_OTHER): Payer: Medicare Other

## 2022-05-04 DIAGNOSIS — E1142 Type 2 diabetes mellitus with diabetic polyneuropathy: Secondary | ICD-10-CM

## 2022-05-04 DIAGNOSIS — E1159 Type 2 diabetes mellitus with other circulatory complications: Secondary | ICD-10-CM

## 2022-05-04 NOTE — Chronic Care Management (AMB) (Signed)
Chronic Care Management   CCM RN Visit Note  05/04/2022 Name: Andrea [name] MRN: [id number] DOB: August 14, 1945  Subjective: Andrea [name] is a 77 y.o. year old female who is a primary care patient of [name], FNP. The patient was referred to the Chronic Care Management team for assistance with care management needs subsequent to provider initiation of CCM services and plan of care.    Today's Visit:  Engaged with patient by telephone for follow up visit.        Goals Addressed             This Visit's Progress    Goal: CCM (Diabetes) Expected Outcome:  Monitor, Self-Manage And Reduce Symptoms of Diabetes       Current Barriers:  Chronic Disease Management support and education needs related to Diabetes  Planned Interventions: Reviewed plan for diabetes management. Reports compliance with treatment plan. Reports taking medications as prescribed. Reviewed blood glucose readings. Reports one elevated fasting reading of 148 within the last few weeks d/t eating out at a buffet the evening prior. Reports remaining fasting readings have ranged from the 80s to 120s. Reading was 123 mg/dl today. Previously discussed options for a continuous glucose monitor. She prefers to continue use of the standard glucometer. Agreed to update the Care Management Pharmacist if this changes. Denies recent hypoglycemic or hyperglycemic episodes.  Reviewed nutritional intake. Reports attempting to adhere to a heart healthy diabetic diet. Reports eating out occasionally. Family remains available to assist with preparing meals. Declines current need for additional nutritional resources. Reviewed preventive health exams. Reports not completing eye exam as planned. Will assist with scheduling for [facility]/Dr. Dingledein.    Lab Results  Component Value Date   HGBA1C 7.6 (H) 03/10/2022     Symptom Management: Take medications as prescribed   Attend all scheduled provider appointments Call  pharmacy for medication refills 3-7 days in advance of running out of medications Check blood sugars daily  Keep a blood sugar log Check feet daily for cuts, sores or redness Wash and dry feet carefully every day Wear comfortable, cotton socks Wear comfortable, well-fitting shoes Read food labels for fat, fiber, carbohydrates and portion size Call provider office for new concerns or questions    Follow Up Plan:  Will follow up next month       Goal: CCM (Hypertension) Expected Outcome:  Monitor, Self-Manage And Reduce Symptoms of Hypertension       Current Barriers:  Chronic Disease Management support and education needs related to Hypertension  Planned Interventions: Reviewed current treatment plan for hypertension management. Reports taking medications as prescribed. Reports receiving all needed medications via recent delivery from [place]. Reviewed information regarding established blood pressure parameters. Reports not recently monitoring d/t a family member having her BP cuff. Agreed to start monitoring at least a few times a week. Advised to keep a log. Reviewed symptoms. Denies chest pain or palpitations. Denies headaches, dizziness, or visual changes. Denies changes or decline in activity tolerance. Reviewed nutritional intake. Reports attempting to adhere to a heart healthy diet but not consistently monitoring nutrition labels. Reports eating out occasionally. Encouraged to read nutrition labels, continue monitoring sodium intake, and avoid highly processed foods when possible.  Reviewed s/sx of heart attack, stroke and worsening symptoms that require immediate medical attention.    BP Readings from Last 3 Encounters:  03/10/22 (!) 122/55  12/10/21 (!) 127/56  09/03/21 101/63     Symptom Management: Take medications as prescribed  Attend all scheduled provider appointments Call pharmacy for medication refills 3-7 days in advance of running out of  medications Call provider office for new concerns or questions  Check blood pressure at least a few times a week Keep a blood pressure log Call doctor for signs and symptoms of high blood pressure Report new symptoms to your doctor Eat whole grains, fruits and vegetables, lean meats and healthy fats Limit salt intake   Follow Up Plan:  Will follow up next month          PLAN: A member of the care management team will follow up next month   [place] Manager/Chronic Care Management [phone]

## 2022-05-06 DIAGNOSIS — I10 Essential (primary) hypertension: Secondary | ICD-10-CM

## 2022-05-06 DIAGNOSIS — E1159 Type 2 diabetes mellitus with other circulatory complications: Secondary | ICD-10-CM | POA: Diagnosis not present

## 2022-05-20 ENCOUNTER — Telehealth: Payer: Self-pay

## 2022-05-20 DIAGNOSIS — E1142 Type 2 diabetes mellitus with diabetic polyneuropathy: Secondary | ICD-10-CM

## 2022-05-20 NOTE — Progress Notes (Signed)
Care Management & Coordination Services Pharmacy Team Pharmacy Assistant   Name: Andrea [name]  MRN: [id number] DOB: 04/24/1945  Reason for Encounter: Medication Coordination and Delivery for [facility]  Contacted patient to discuss medications and coordinate delivery from [place]. Spoke with patient on 05/20/2022   Chart review: Recent office visits:  None ID  Recent consult visits:  None ID  Hospital visits:  None in previous 6 months  Medications: Outpatient Encounter Medications as of 05/20/2022  Medication Sig   Alcohol Swabs (ALCOHOL PADS) 70 % PADS 1 each by Does not apply route in the morning and at bedtime.   atorvastatin (LIPITOR) 20 MG tablet Take 1 tablet (20 mg total) by mouth at bedtime.   Calcium Carbonate-Vitamin D 600-400 MG-UNIT tablet Take 1 tablet by mouth daily.   clobetasol (TEMOVATE) 0.05 % external solution Apply 1 application. topically 2 (two) times daily.   Cyanocobalamin (B-12) 1000 MCG CAPS Take 1,000 mcg by mouth daily.   dapagliflozin propanediol (FARXIGA) 10 MG TABS tablet Take 1 tablet (10 mg total) by mouth daily. TAKE ONE TABLET BY MOUTH ONCE DAILY BEFORE BREAKFAST   furosemide (LASIX) 20 MG tablet TAKE ONE TABLET BY MOUTH ONCE DAILY   hydrOXYzine (ATARAX) 25 MG tablet TAKE 1 TABLET BY MOUTH THREE TIMES A DAY AS NEEDED   insulin degludec (TRESIBA FLEXTOUCH) 100 UNIT/ML FlexTouch Pen Inject 32 Units into the skin at bedtime. Patient receives via [name] Patient Assistance through December 2024   Insulin Pen Needle (TRUEPLUS PEN NEEDLES) 31G X 6 MM MISC Use with lantus two times daily.   INSULIN SYRINGE 1CC/29G 29G X 1/2" 1 ML MISC For lantus injections twice daily   losartan (COZAAR) 25 MG tablet TAKE 1 TABLET BY MOUTH EVERY DAY   metoprolol succinate (TOPROL-XL) 50 MG 24 hr tablet TAKE ONE TABLET BY MOUTH ONCE DAILY   montelukast (SINGULAIR) 10 MG tablet Take 1 tablet (10 mg total) by mouth at bedtime.   Multiple  Vitamins-Minerals (ICAPS AREDS 2 PO) Take by mouth.   Multiple Vitamins-Minerals (PRESERVISION AREDS 2 PO) Take 1 tablet by mouth in the morning and at bedtime.   Omega-3 1000 MG CAPS Take 1,000 mg by mouth daily.   omeprazole (PRILOSEC) 40 MG capsule Take 1 capsule (40 mg total) by mouth at bedtime.   OneTouch [place] Lancets 99991111 MISC To check blood sugar daily  DX: E11.9   ONETOUCH VERIO test strip Use to check blood sugar daily as directed. DX E11.42   oxybutynin (DITROPAN-XL) 10 MG 24 hr tablet TAKE ONE TABLET BY MOUTH EVERYDAY AT BEDTIME   Semaglutide,0.25 or 0.'5MG'$ /DOS, (OZEMPIC, 0.25 OR 0.5 MG/DOSE,) 2 MG/3ML SOPN Inject 0.5 mg into the skin once a week. Patient receives via [name] Patient Assistance through December 2024   sertraline (ZOLOFT) 25 MG tablet Take 1 tablet (25 mg total) by mouth daily.   Vitamin D, Ergocalciferol, (DRISDOL) 1.25 MG (50000 UNIT) CAPS capsule Take 1 capsule (50,000 Units total) by mouth every 7 (seven) days.   No facility-administered encounter medications on file as of 05/20/2022.   BP Readings from Last 3 Encounters:  03/10/22 (!) 122/55  12/10/21 (!) 127/56  09/03/21 101/63    Pulse Readings from Last 3 Encounters:  03/10/22 92  12/10/21 91  09/03/21 90    Lab Results  Component Value Date/Time   HGBA1C 7.6 (H) 03/10/2022 11:38 AM   HGBA1C 8.6 (H) 12/10/2021 11:37 AM   HGBA1C 11.3 06/03/2021 04:44 PM   HGBA1C  7.5 (H) 11/26/2013 04:14 AM   Lab Results  Component Value Date   CREATININE 1.32 (H) 03/10/2022   BUN 29 (H) 03/10/2022   GFRNONAA 46 (L) 12/22/2020   GFRAA 70 02/11/2020   NA 144 03/10/2022   K 4.7 03/10/2022   CALCIUM 10.1 03/10/2022   CO2 24 03/10/2022   Cycle dispensing form sent to [name], CPP for review.   Last adherence delivery date: 05/03/2022      Patient is due for next adherence delivery on: 06/01/2022 1st Route  This delivery to include: Adherence Packaging  30 Days  Atorvastatin 20 mg 1 tablet daily  (Bedtime) Furosemide 20 mg 1 tablet daily (Breakfast) Losartan 25 mg 1 tablet daily (Breakfast) Metoprolol XL 50 mg 1 tablet daily (Breakfast) Omeprazole  40 mg 1 tablet daily (Breakfast) Oxybutynin XL 10 mg 1 tablet daily (Bedtime) Montelukast 10 mg 1 tablet daily (Bedtime) Vitamin D 50,000 U 1 tablet weekly (on Fridays) Sertraline 25 mg 1 tablet daily (Breakfast) Farxiga 10 mg 1 tablet daily (Before Breakfast)  Patient declined the following medications this month: No medications were declined  Refills requested from providers include: Omeprazole 40 mg, and Vit D 50,0000 U both are PCP medication that can be sent in by CPP  Confirmed delivery date of 06/01/2022 1st Route, advised patient that pharmacy will contact them the morning of delivery.  Any concerns about your medications? No  How often do you forget or accidentally miss a dose? Never  Do you use a pillbox? No  Is patient in packaging Yes  Patient has follow-up with CPP on 05/28/2022 @ 1100.  I spoke with the patient and per patient she is doing well. Patient denies any issues or concerns at this time.   Lynann Bologna, CPA/CMA Clinical Pharmacist Assistant Phone: [phone]

## 2022-05-25 ENCOUNTER — Other Ambulatory Visit: Payer: Self-pay | Admitting: Family Medicine

## 2022-05-25 DIAGNOSIS — E559 Vitamin D deficiency, unspecified: Secondary | ICD-10-CM

## 2022-05-25 DIAGNOSIS — K219 Gastro-esophageal reflux disease without esophagitis: Secondary | ICD-10-CM

## 2022-05-27 MED ORDER — ONETOUCH DELICA LANCETS 33G MISC: 11 refills | Status: AC

## 2022-05-27 MED ORDER — ONETOUCH VERIO VI STRP: ORAL_STRIP | 11 refills | Status: DC

## 2022-05-27 NOTE — Addendum Note (Signed)
Addended by: Daron Offer A on: 05/27/2022 05:04 PM   Modules accepted: Orders

## 2022-05-31 ENCOUNTER — Ambulatory Visit: Payer: Medicare Other

## 2022-05-31 DIAGNOSIS — E1142 Type 2 diabetes mellitus with diabetic polyneuropathy: Secondary | ICD-10-CM

## 2022-05-31 DIAGNOSIS — E1169 Type 2 diabetes mellitus with other specified complication: Secondary | ICD-10-CM

## 2022-05-31 DIAGNOSIS — I5022 Chronic systolic (congestive) heart failure: Secondary | ICD-10-CM

## 2022-05-31 MED ORDER — FREESTYLE LIBRE 3 READER DEVI: 1.0000 | Freq: Once | 0 refills | Status: AC

## 2022-05-31 MED ORDER — TRESIBA FLEXTOUCH 100 UNIT/ML ~~LOC~~ SOPN: 28.0000 [IU] | PEN_INJECTOR | Freq: Every day | SUBCUTANEOUS | Status: DC

## 2022-05-31 MED ORDER — FREESTYLE LIBRE 3 SENSOR MISC: 11 refills | Status: DC

## 2022-05-31 NOTE — Progress Notes (Signed)
Care Management & Coordination Services Pharmacy Note  05/31/2022 Name:  Andrea [name] MRN:  [id number] DOB:  1946-02-07  Summary: Patient presents for follow-up consult.  -Patient blood sugars are borderline low many mornings. Patient denies symptoms of hypoglycemia, but may be asymptomatic given beta blocker use.   Recommendations/Changes made from today's visit: -START CGM.  -DECREASE Tresiba to 28 units daily   Follow up plan: CPP follow-up 1 months  Subjective: Andrea [name] is an 77 y.o. year old female who is a primary patient of [name], [place].  The care coordination team was consulted for assistance with disease management and care coordination needs.    Engaged with patient by telephone for follow up visit.  Recent office visits: 03/10/22: Patient presented to [name], FNP for follow-up. A1c 7.6% 12/10/21: Patient presented to [name], FNP for follow-up. A1c 8.6%.   Recent consult visits: None in previous 6 months  Hospital visits: None in previous 6 months   Objective:  Lab Results  Component Value Date   CREATININE 1.32 (H) 03/10/2022   BUN 29 (H) 03/10/2022   EGFR 42 (L) 03/10/2022   GFRNONAA 46 (L) 12/22/2020   GFRAA 70 02/11/2020   NA 144 03/10/2022   K 4.7 03/10/2022   CALCIUM 10.1 03/10/2022   CO2 24 03/10/2022   GLUCOSE 147 (H) 03/10/2022    Lab Results  Component Value Date/Time   HGBA1C 7.6 (H) 03/10/2022 11:38 AM   HGBA1C 8.6 (H) 12/10/2021 11:37 AM   HGBA1C 11.3 06/03/2021 04:44 PM   HGBA1C 7.5 (H) 11/26/2013 04:14 AM   MICROALBUR 20 12/16/2016 03:10 PM    Last diabetic Eye exam:  Lab Results  Component Value Date/Time   HMDIABEYEEXA No Retinopathy 04/08/2021 12:00 AM    Last diabetic Foot exam: No results found for: "HMDIABFOOTEX"   Lab Results  Component Value Date   CHOL 132 12/10/2021   HDL 58 12/10/2021   LDLCALC 45 12/10/2021   TRIG 180 (H) 12/10/2021   CHOLHDL 2.3 12/10/2021       Latest Ref Rng &  Units 03/10/2022   11:38 AM 12/10/2021   11:37 AM 06/03/2021    4:44 PM  Hepatic Function  Total Protein 6.0 - 8.5 g/dL 6.8  6.7    Albumin 3.8 - 4.8 g/dL 4.4  4.3  4.2      AST 0 - 40 IU/L 15  13  15       ALT 0 - 32 IU/L 11  8  10       Alk Phosphatase 44 - 121 IU/L 155  132    Total Bilirubin 0.0 - 1.2 mg/dL 0.6  0.6       This result is from an external source.    Lab Results  Component Value Date/Time   TSH 1.88 06/03/2021 04:44 PM   TSH 1.880 06/02/2021 12:00 AM   TSH 1.220 12/28/2018 04:26 PM   FREET4 1.33 06/02/2021 12:00 AM       Latest Ref Rng & Units 12/20/2020    4:49 AM 12/19/2020    5:45 AM 12/18/2020   10:22 AM  CBC  WBC 4.0 - 10.5 K/uL 7.6  7.0  9.5   Hemoglobin 12.0 - 15.0 g/dL 10.5  10.4  11.4   Hematocrit 36.0 - 46.0 % 32.7  31.8  35.1   Platelets 150 - 400 K/uL 211  189  188     Lab Results  Component Value Date/Time   VITAMINB12 1,375 (H)  11/23/2020 12:13 PM    Clinical ASCVD: Yes  The ASCVD Risk score (Arnett DK, et al., 2019) failed to calculate for the following reasons:   The patient has a prior MI or stroke diagnosis       03/17/2022    9:44 AM 12/10/2021   11:21 AM 06/30/2021    1:04 PM  Depression screen PHQ 2/9  Decreased Interest 0 0 0  Down, Depressed, Hopeless 0 0 0  PHQ - 2 Score 0 0 0  Altered sleeping  0   Tired, decreased energy  0   Change in appetite  0   Feeling bad or failure about yourself   0   Trouble concentrating  0   Moving slowly or fidgety/restless  0   Suicidal thoughts  0   PHQ-9 Score  0   Difficult doing work/chores  Not difficult at all      Social History   Tobacco Use  Smoking Status Former   Packs/day: 1.00   Years: 30.00   Additional pack years: 0.00   Total pack years: 30.00   Types: Cigarettes   Quit date: 07/17/2012   Years since quitting: 9.8  Smokeless Tobacco Never  Tobacco Comments       BP Readings from Last 3 Encounters:  03/10/22 (!) 122/55  12/10/21 (!) 127/56  09/03/21 101/63    Pulse Readings from Last 3 Encounters:  03/10/22 92  12/10/21 91  09/03/21 90   Wt Readings from Last 3 Encounters:  03/10/22 141 lb 6.4 oz (64.1 kg)  12/10/21 141 lb (64 kg)  09/03/21 143 lb 8 oz (65.1 kg)   BMI Readings from Last 3 Encounters:  03/10/22 30.60 kg/m  12/10/21 30.51 kg/m  09/03/21 28.03 kg/m    Allergies  Allergen Reactions   Chlorhexidine    Metformin And Related Diarrhea   Sulfa Antibiotics Rash    Medications Reviewed Today     Reviewed by Neldon Labella, RN (Registered Nurse) on 05/04/22 at [phone]  Med List Status: <None>   Medication Order Taking? Sig Documenting Provider Last Dose Status Informant  Alcohol Swabs (ALCOHOL PADS) 70 % PADS [id number] No 1 each by Does not apply route in the morning and at bedtime. [name], FNP Taking Active   atorvastatin (LIPITOR) 20 MG tablet [id number] No Take 1 tablet (20 mg total) by mouth at bedtime. [name], FNP Taking Active   Calcium Carbonate-Vitamin D 600-400 MG-UNIT tablet [id number] No Take 1 tablet by mouth daily. [provider] Taking Active Nursing Home Medication Administration Guide (MAG)  clobetasol (TEMOVATE) 0.05 % external solution [id number] No Apply 1 application. topically 2 (two) times daily. [name], FNP Taking Active   Cyanocobalamin (B-12) 1000 MCG CAPS [id number] No Take 1,000 mcg by mouth daily. [provider] Taking Active Nursing Home Medication Administration Guide (MAG)  dapagliflozin propanediol (FARXIGA) 10 MG TABS tablet [id number]  Take 1 tablet (10 mg total) by mouth daily. TAKE ONE TABLET BY MOUTH ONCE DAILY BEFORE BREAKFAST [name], FNP  Active   furosemide (LASIX) 20 MG tablet [id number]  TAKE ONE TABLET BY MOUTH ONCE DAILY [name], FNP  Active   hydrOXYzine (ATARAX) 25 MG tablet [id number] No TAKE 1 TABLET BY MOUTH THREE TIMES A DAY AS NEEDED [name], FNP Taking Active   insulin degludec (TRESIBA FLEXTOUCH) 100 UNIT/ML  FlexTouch Pen [id number]  Inject 32 Units into the skin at bedtime. Patient receives via [name] Patient Assistance  through December 2024 [name], FNP  Active   Insulin Pen Needle (TRUEPLUS PEN NEEDLES) 31G X 6 MM MISC [id number] No Use with lantus two times daily. Virginia Crews, MD Taking Active Nursing Home Medication Administration Guide (MAG)  INSULIN SYRINGE 1CC/29G 29G X 1/2" 1 ML MISC [id number] No For lantus injections twice daily Mar Daring, PA-C Taking Active Nursing Home Medication Administration Guide (MAG)  losartan (COZAAR) 25 MG tablet [id number]  TAKE 1 TABLET BY MOUTH EVERY DAY [name], FNP  Active   metoprolol succinate (TOPROL-XL) 50 MG 24 hr tablet [id number]  TAKE ONE TABLET BY MOUTH ONCE DAILY [name], FNP  Active   montelukast (SINGULAIR) 10 MG tablet [id number]  Take 1 tablet (10 mg total) by mouth at bedtime. [name], FNP  Active   Multiple Vitamins-Minerals (ICAPS AREDS 2 PO) [id number]  Take by mouth. [provider]  Active   Multiple Vitamins-Minerals (PRESERVISION AREDS 2 PO) [id number] No Take 1 tablet by mouth in the morning and at bedtime. [provider] Taking Active Nursing Home Medication Administration Guide (MAG)  Omega-3 1000 MG CAPS [id number] No Take 1,000 mg by mouth daily. [provider] Taking Active Nursing Home Medication Administration Guide (MAG)  omeprazole (PRILOSEC) 40 MG capsule [id number] No Take 1 capsule (40 mg total) by mouth at bedtime. [name], FNP Taking Active   [facility] Lancets [id number] [place] [id number]  To check blood sugar daily  DX: E11.9 [name], FNP  Active   [facility] VERIO test strip [id number]  Use to check blood sugar daily as directed. DX E11.42 [name] T, FNP  Active   oxybutynin (DITROPAN-XL) 10 MG 24 hr tablet [id number]  TAKE ONE TABLET BY MOUTH EVERYDAY AT BEDTIME [name], FNP  Active   Semaglutide,0.25 or 0.5MG /DOS, (OZEMPIC, 0.25 OR 0.5  MG/DOSE,) 2 MG/3ML SOPN [id number]  Inject 0.5 mg into the skin once a week. Patient receives via [name] Patient Assistance through December 2024 [name] T, FNP  Active   sertraline (ZOLOFT) 25 MG tablet [id number] No Take 1 tablet (25 mg total) by mouth daily. [name], FNP Taking Active   Vitamin D, Ergocalciferol, (DRISDOL) 1.25 MG (50000 UNIT) CAPS capsule [id number] No Take 1 capsule (50,000 Units total) by mouth every 7 (seven) days. [name], FNP Taking Active   Med List Note Loann Quill, CPhT 12/18/20 1047): [facility] [phone]            SDOH:  (Social Determinants of Health) assessments and interventions performed: Yes SDOH Interventions    Flowsheet Row Chronic Care Management from 03/17/2022 in [place] Most recent reading at 03/17/2022 10:12 AM Chronic Care Management from 06/23/2021 in [place] Most recent reading at 07/06/2021 10:27 AM Clinical Support from 06/30/2021 in [place] Most recent reading at 06/30/2021  1:05 PM Chronic Care Management from 11/17/2020 in [place] Most recent reading at 11/17/2020  3:05 PM Office Visit from 12/26/2019 in [place] Most recent reading at 12/26/2019 11:13 AM  SDOH Interventions       Food Insecurity Interventions Intervention Not Indicated -- Intervention Not Indicated Intervention Not Indicated --  Housing Interventions Intervention Not Indicated -- Intervention Not Indicated -- --  Transportation Interventions Intervention Not Indicated Intervention Not Indicated Intervention Not Indicated [id number] Referral --  Utilities Interventions Intervention Not Indicated -- -- -- --  Alcohol Usage Interventions Intervention Not Indicated (Score <7) -- -- -- --  Depression Interventions/Treatment  -- -- -- -- Medication  Financial Strain Interventions Intervention Not  Indicated Other (Comment)  [PAP] Intervention Not Indicated -- --  Physical Activity Interventions Other (Comments)  [Declined need for activity resources] -- Intervention Not Indicated -- --  Stress Interventions Intervention Not Indicated -- Intervention Not Indicated -- --  Social Connections Interventions Other (Comment)  [Declined need for community resources] -- Intervention Not Indicated -- --       Medication Assistance:  Joni Reining obtained through [name] medication assistance program.  Enrollment ends Dec 2024  Medication Access: Within the past 30 days, how often has patient missed a dose of medication? No Is a pillbox or other method used to improve adherence? Yes  Factors that may affect medication adherence? no barriers identified Are meds synced by current pharmacy? Yes  Are meds delivered by current pharmacy? Yes  Does patient experience delays in picking up medications due to transportation concerns? No   [facility] Reviewed: Current Rx insurance plan: [employer] Name and location of Current pharmacy:   Compliance/Adherence/Medication fill history: Care Gaps: Tetanus/TDAP Dexa Scan Diabetic Foot Exam  Star-Rating Drugs: Atorvastatin 20 mg last filled on 03/29/22 for a 30-Day supply with [facility] Farxiga 10 mg last filled on 03/29/22 for a 30-Day supply with [facility] Losartan 25 mg last filled on 03/29/22 for a 30-Day supply with [facility]   Assessment/Plan  Heart Failure (Goal: manage symptoms and prevent exacerbations) -Controlled -Last ejection fraction: 40-45% (Date: Jan 2023) -HF type: HFmrEF (mildly reduced EF 41-49%) -NYHA Class: III (marked limitation of activity) -Current treatment: Farxiga 10 mg daily: Appropriate, Effective, Safe, Accessible  Furosemide 20 mg daily: Appropriate, Effective, Safe, Accessible Losartan 25 mg daily: Appropriate, Effective, Safe, Accessible  Metoprolol XL 50 mg daily: Appropriate,  Effective, Safe, Accessible  -Medications previously tried: NA  -Shortness of breath when walking  -Current home BP/HR readings: Not monitoring -Continue current medications   Hyperlipidemia: (LDL goal < 55) -Controlled -History of PAD, CAD, TIA -Current treatment: Atorvastatin 20 mg daily: Appropriate, Effective, Safe, Accessible  -Medications previously tried: NA  -Recommended to continue current medication  Diabetes (A1c goal <8%) -Controlled -Current medications: Farxiga 10 mg daily: Appropriate, Effective, Safe, Accessible  Tresiba 32 units daily (0.5 u/kg): Appropriate, Effective, Safe, Accessible Ozempic 0.5 mg weekly: Appropriate, Effective, Safe, Accessible  -Medications previously tried: Jardiance (Yeast Infection) -Current home glucose readings:  24-Mar 104  23-Mar 71  22-Mar 93  21-Mar 97  20-Mar 112  19-Mar 125  18-Mar 87  17-Mar 96  16-Mar 108  15-Mar 87  14-Mar 127  13-Mar 121  12-Mar 85  11-Mar 117  Average 102   -Reports hyperglycemic symptoms: polydipsia  -Current dietary Patterns: Reducing  -Patient blood sugars are borderline low many mornings. Patient denies symptoms of hypoglycemia, but may be asymptomatic given beta blocker use.  -START CGM.  -DECREASE Tresiba to 28 units daily   Anxiety / Insomnia (Goal: Maintain symptom remission) -Controlled: Not addressed during this visit. -Current treatment: Hydroxyzine 25 mg three times daily as needed: Appropriate, Effective, Safe, Accessible Sertraline 25 mg daily: Appropriate, Effective, Safe, Accessible  -Medications previously tried/failed: NA -GAD7: 8 -Enjoys puzzles, word searches  -Continue current medications   Osteoporosis / Osteopenia (Goal Prevent fractures) -Uncontrolled: Not addressed during this visit. -Patient is a candidate for pharmacologic treatment due to T-Score < -2.5 in femoral neck -Current treatment  Calcium-Vitamin D twice daily:  Appropriate, Effective, Safe, Accessible   Ergocalciferol 50,000 units weekly: Appropriate, Effective, Safe, Accessible  -Medications previously tried: NA  -Recommended to continue current medication  GERD (Goal: Prevent reflux) -Controlled: Not addressed during this visit. -Current treatment  Omeprazole 40 mg daily  -Medications previously tried: NA  -Excellent symptom control, rebound symptoms if skipping a day.  -Recommended to continue current medication  Overactive Bladder (Goal: Minimize symptoms) -Controlled: Not addressed during this visit. -Current treatment  Oxybutynin XL 10 mg daily  -Medications previously tried: NA -Waking up 2-3 times nightly, much better controlled with oxybutynin.  -Will see if symptom changes as hyperglycemia improves.   -Recommended to continue current medication  [name], PharmD, [place], [place] Pharmacist Practitioner  [facility] [phone]

## 2022-06-08 ENCOUNTER — Other Ambulatory Visit: Payer: Self-pay | Admitting: Family Medicine

## 2022-06-09 NOTE — Progress Notes (Unsigned)
I,Joseline E Rosas,acting as a scribe for [name], FNP.,have documented all relevant documentation on the behalf of [name], FNP,as directed by  [name], FNP while in the presence of [name], FNP.   Established patient visit  Patient: Andrea [name]   DOB: 01-13-1946   77 y.o. Female  MRN: [id number] Visit Date: 06/10/2022  Today's healthcare provider: [name], FNP  Re Introduced to nurse practitioner role and practice setting.  All questions answered.  Discussed provider/patient relationship and expectations.  Patient presents with her daughter. She is currently using a walker.  Chief Complaint  Patient presents with   Follow-up   Subjective    HPI  Follow up for Asthenia  The patient was last seen for this 3 months ago. Changes made at last visit include encouraged to use walker to assist, focus on posture modifications to engage core when using walker- frequently too far in front and hunching over Encourage more sit to stands when alone to build leg and core strength.  She reports good compliance with treatment. She feels that condition is Improved. She is having side effects.   -----------------------------------------------------------------------------------------   Medications: Outpatient Medications Prior to Visit  Medication Sig   Alcohol Swabs (ALCOHOL PADS) 70 % PADS 1 each by Does not apply route in the morning and at bedtime.   atorvastatin (LIPITOR) 20 MG tablet Take 1 tablet (20 mg total) by mouth at bedtime.   Calcium Carbonate-Vitamin D 600-400 MG-UNIT tablet Take 1 tablet by mouth daily.   clobetasol (TEMOVATE) 0.05 % external solution Apply 1 application. topically 2 (two) times daily.   Continuous Blood Gluc Sensor (FREESTYLE LIBRE 3 SENSOR) MISC Use to monitor blood sugars continuously as directed.   Cyanocobalamin (B-12) 1000 MCG CAPS Take 1,000 mcg by mouth daily.   dapagliflozin propanediol (FARXIGA) 10 MG TABS tablet  Take 1 tablet (10 mg total) by mouth daily. TAKE ONE TABLET BY MOUTH ONCE DAILY BEFORE BREAKFAST   furosemide (LASIX) 20 MG tablet TAKE ONE TABLET BY MOUTH ONCE DAILY   hydrOXYzine (ATARAX) 25 MG tablet TAKE 1 TABLET BY MOUTH THREE TIMES A DAY AS NEEDED   insulin degludec (TRESIBA FLEXTOUCH) 100 UNIT/ML FlexTouch Pen Inject 28 Units into the skin daily. Patient receives via [name] Patient Assistance through December 2024   Insulin Pen Needle (TRUEPLUS PEN NEEDLES) 31G X 6 MM MISC Use with lantus two times daily.   INSULIN SYRINGE 1CC/29G 29G X 1/2" 1 ML MISC For lantus injections twice daily   losartan (COZAAR) 25 MG tablet TAKE 1 TABLET BY MOUTH EVERY DAY   metoprolol succinate (TOPROL-XL) 50 MG 24 hr tablet TAKE ONE TABLET BY MOUTH ONCE DAILY   montelukast (SINGULAIR) 10 MG tablet Take 1 tablet (10 mg total) by mouth at bedtime.   Multiple Vitamins-Minerals (ICAPS AREDS 2 PO) Take by mouth.   Multiple Vitamins-Minerals (PRESERVISION AREDS 2 PO) Take 1 tablet by mouth in the morning and at bedtime.   Omega-3 1000 MG CAPS Take 1,000 mg by mouth daily.   omeprazole (PRILOSEC) 40 MG capsule TAKE ONE CAPSULE BY MOUTH EVERY MORNING   OneTouch Delica Lancets 99991111 MISC To check blood sugar three times daily  DX: E11.42   ONETOUCH VERIO test strip Use to check blood sugar three times daily. DX E11.42   oxybutynin (DITROPAN-XL) 10 MG 24 hr tablet TAKE ONE TABLET BY MOUTH EVERYDAY AT BEDTIME   Semaglutide,0.25 or 0.5MG /DOS, (OZEMPIC, 0.25 OR 0.5 MG/DOSE,)  2 MG/3ML SOPN Inject 0.5 mg into the skin once a week. Patient receives via [name] Patient Assistance through December 2024   sertraline (ZOLOFT) 25 MG tablet Take 1 tablet (25 mg total) by mouth daily.   Vitamin D, Ergocalciferol, (DRISDOL) 1.25 MG (50000 UNIT) CAPS capsule TAKE ONE CAPSULE BY MOUTH ONCE WEEKLY ON FRIDAY   No facility-administered medications prior to visit.   Review of Systems  Last CBC Lab Results  Component Value  Date   WBC 7.6 12/20/2020   HGB 10.5 (L) 12/20/2020   HCT 32.7 (L) 12/20/2020   MCV 90.1 12/20/2020   MCH 28.9 12/20/2020   RDW 17.7 (H) 12/20/2020   PLT 211 [id number]   Last metabolic panel Lab Results  Component Value Date   GLUCOSE 147 (H) 03/10/2022   NA 144 03/10/2022   K 4.7 03/10/2022   CL 101 03/10/2022   CO2 24 03/10/2022   BUN 29 (H) 03/10/2022   CREATININE 1.32 (H) 03/10/2022   EGFR 42 (L) 03/10/2022   CALCIUM 10.1 03/10/2022   PROT 6.8 03/10/2022   ALBUMIN 4.4 03/10/2022   LABGLOB 2.4 03/10/2022   AGRATIO 1.8 03/10/2022   BILITOT 0.6 03/10/2022   ALKPHOS 155 (H) 03/10/2022   AST 15 03/10/2022   ALT 11 03/10/2022   ANIONGAP 5 12/22/2020   Last lipids Lab Results  Component Value Date   CHOL 132 12/10/2021   HDL 58 12/10/2021   LDLCALC 45 12/10/2021   TRIG 180 (H) 12/10/2021   CHOLHDL 2.3 12/10/2021   Last hemoglobin A1c Lab Results  Component Value Date   HGBA1C 7.6 (H) 03/10/2022       Objective    BP (!) 127/59 (BP Location: Right Arm, Patient Position: Sitting, Cuff Size: Normal)   Pulse 92   Temp 98.4 F (36.9 C) (Oral)   Resp 12   Ht 4\' 9"  (1.448 m)   Wt 139 lb 4.8 oz (63.2 kg)   SpO2 98%   BMI 30.14 kg/m   BP Readings from Last 3 Encounters:  06/10/22 (!) 127/59  03/10/22 (!) 122/55  12/10/21 (!) 127/56   Wt Readings from Last 3 Encounters:  06/10/22 139 lb 4.8 oz (63.2 kg)  03/10/22 141 lb 6.4 oz (64.1 kg)  12/10/21 141 lb (64 kg)   SpO2 Readings from Last 3 Encounters:  06/10/22 98%  12/10/21 99%  09/03/21 93%   Physical Exam Vitals and nursing note reviewed.  Constitutional:      General: She is not in acute distress.    Appearance: Normal appearance. She is obese. She is not ill-appearing, toxic-appearing or diaphoretic.  HENT:     Head: Normocephalic and atraumatic.  Cardiovascular:     Rate and Rhythm: Normal rate and regular rhythm.     Pulses: Normal pulses.     Heart sounds: Normal heart sounds. No murmur  heard.    No friction rub. No gallop.  Pulmonary:     Effort: Pulmonary effort is normal. No respiratory distress.     Breath sounds: Normal breath sounds. No stridor. No wheezing, rhonchi or rales.  Chest:     Chest wall: No tenderness.  Abdominal:     General: Bowel sounds are normal.     Palpations: Abdomen is soft.  Musculoskeletal:        General: No swelling, tenderness, deformity or signs of injury. Normal range of motion.     Right lower leg: No edema.     Left lower leg: No edema.  Skin:    General: Skin is warm and dry.     Capillary Refill: Capillary refill takes less than 2 seconds.     Coloration: Skin is not jaundiced or pale.     Findings: No bruising, erythema, lesion or rash.  Neurological:     General: No focal deficit present.     Mental Status: She is alert and oriented to person, place, and time. Mental status is at baseline.     Cranial Nerves: No cranial nerve deficit.     Sensory: No sensory deficit.     Motor: No weakness.     Coordination: Coordination normal.  Psychiatric:        Mood and Affect: Mood normal.        Behavior: Behavior normal.        Thought Content: Thought content normal.        Judgment: Judgment normal.    No results found for any visits on 06/10/22.  Assessment & Plan     Problem List Items Addressed This Visit       Cardiovascular and Mediastinum   Chronic systolic CHF (congestive heart failure)    Chronic, potentially worsening Pt notes primary use of w/c at home given the fact that "she can get more done" Continue to recommend follow up cardiologist to assist with best care BP remains at goal Normal cardiac and pulmonary exam      Relevant Orders   Hemoglobin A1c   Lipid panel   Basic Metabolic Panel (BMET)   CBC   Hypertension associated with diabetes    Chronic, stable Goal of 129/79 or less; at goal Continue previous medications - farxiga 10 - lasix 20 - losartan 25 - metop xl 50       Relevant  Orders   Basic Metabolic Panel (BMET)   CBC     Endocrine   Type 2 diabetes mellitus with diabetic polyneuropathy, without long-term current use of insulin - Primary    Chronic, previously elevated Repeat A1c Repeat Urine Micro Has been using ozempic at 0.5 mg/week to assist Secure chat sent to [facility] pharmacist, [name] to discuss that patient has not received her CGM as discussed at her last telephone encounter       Relevant Orders   Hemoglobin A1c   Type 2 diabetes mellitus with hyperlipidemia    Chronic, previously stable LDL last at 45; repeat LP Goal of <70 I continue to recommend diet low in saturated fat and regular exercise - 30 min at least 5 times per week; known CAD      Relevant Orders   Hemoglobin A1c   Lipid panel     Other   Age-related physical debility    Chronic, worsening Patient continues to use walker only when out with her daughter; notes that she uses w/c primarily at home      History of tobacco abuse    Recommendation for low dose lung CT for early detection; orders placed      Relevant Orders   Ambulatory Referral Lung Cancer Screening [facility]   Iron deficiency anemia secondary to inadequate dietary iron intake    Chronic, endorses fatigue Repeat CBC      Relevant Orders   CBC   Microalbuminuria    Chronic, repeat urine micro Has been successful in reducing A1c despite the dact that A1c is still not at goal of <7%      Relevant Orders   Urine Microalbumin w/creat. ratio  Post-menopausal    Repeat bone density; slight kyphosis noted as previously listed with dx of OP      Relevant Orders   DG Bone Density   Screen for colon cancer    Overdue for 3 year follow up for colon cancer; referral placed to Dr Vicente Males Denies changes with stool color, consistency or frequency       Relevant Orders   Ambulatory referral to Gastroenterology   Screening mammogram for breast cancer    Due for screening for mammogram, denies  breast concerns, provided with phone number to call and schedule appointment for mammogram. Encouraged to repeat breast cancer screening every 1-2 years.       Relevant Orders   MM 3D SCREENING MAMMOGRAM BILATERAL BREAST   Return in about 3 months (around 09/09/2022) for chonic disease management, HTN management, T2DM management, anxiety and depression.     Vonna Kotyk, FNP, have reviewed all documentation for this visit. The documentation on 06/10/22 for the exam, diagnosis, procedures, and orders are all accurate and complete.  [name], Stephen [phone] (phone) [phone] (fax)  [place]

## 2022-06-10 ENCOUNTER — Ambulatory Visit (INDEPENDENT_AMBULATORY_CARE_PROVIDER_SITE_OTHER): Payer: Medicare Other | Admitting: Family Medicine

## 2022-06-10 ENCOUNTER — Encounter: Payer: Self-pay | Admitting: Family Medicine

## 2022-06-10 VITALS — BP 127/59 | HR 92 | Temp 98.4°F | Resp 12 | Ht <= 58 in | Wt 139.3 lb

## 2022-06-10 DIAGNOSIS — Z1231 Encounter for screening mammogram for malignant neoplasm of breast: Secondary | ICD-10-CM | POA: Insufficient documentation

## 2022-06-10 DIAGNOSIS — E1159 Type 2 diabetes mellitus with other circulatory complications: Secondary | ICD-10-CM

## 2022-06-10 DIAGNOSIS — R54 Age-related physical debility: Secondary | ICD-10-CM | POA: Diagnosis not present

## 2022-06-10 DIAGNOSIS — E1169 Type 2 diabetes mellitus with other specified complication: Secondary | ICD-10-CM

## 2022-06-10 DIAGNOSIS — Z1211 Encounter for screening for malignant neoplasm of colon: Secondary | ICD-10-CM | POA: Diagnosis not present

## 2022-06-10 DIAGNOSIS — D508 Other iron deficiency anemias: Secondary | ICD-10-CM | POA: Diagnosis not present

## 2022-06-10 DIAGNOSIS — E785 Hyperlipidemia, unspecified: Secondary | ICD-10-CM

## 2022-06-10 DIAGNOSIS — R809 Proteinuria, unspecified: Secondary | ICD-10-CM | POA: Insufficient documentation

## 2022-06-10 DIAGNOSIS — I5022 Chronic systolic (congestive) heart failure: Secondary | ICD-10-CM | POA: Diagnosis not present

## 2022-06-10 DIAGNOSIS — E1142 Type 2 diabetes mellitus with diabetic polyneuropathy: Secondary | ICD-10-CM | POA: Diagnosis not present

## 2022-06-10 DIAGNOSIS — I152 Hypertension secondary to endocrine disorders: Secondary | ICD-10-CM | POA: Diagnosis not present

## 2022-06-10 DIAGNOSIS — Z87891 Personal history of nicotine dependence: Secondary | ICD-10-CM | POA: Diagnosis not present

## 2022-06-10 DIAGNOSIS — Z780 Asymptomatic menopausal state: Secondary | ICD-10-CM | POA: Diagnosis not present

## 2022-06-10 NOTE — Assessment & Plan Note (Signed)
Chronic, worsening Patient continues to use walker only when out with her daughter; notes that she uses w/c primarily at home

## 2022-06-10 NOTE — Assessment & Plan Note (Signed)
Chronic, endorses fatigue Repeat CBC

## 2022-06-10 NOTE — Assessment & Plan Note (Signed)
Chronic, previously elevated Repeat A1c Repeat Urine Micro Has been using ozempic at 0.5 mg/week to assist Secure chat sent to [facility] pharmacist, [name] to discuss that patient has not received her CGM as discussed at her last telephone encounter

## 2022-06-10 NOTE — Assessment & Plan Note (Signed)
Chronic, previously stable LDL last at 45; repeat LP Goal of <70 I continue to recommend diet low in saturated fat and regular exercise - 30 min at least 5 times per week; known CAD

## 2022-06-10 NOTE — Assessment & Plan Note (Signed)
Chronic, repeat urine micro Has been successful in reducing A1c despite the dact that A1c is still not at goal of <7%

## 2022-06-10 NOTE — Assessment & Plan Note (Signed)
Overdue for 3 year follow up for colon cancer; referral placed to Dr Vicente Males Denies changes with stool color, consistency or frequency

## 2022-06-10 NOTE — Assessment & Plan Note (Signed)
Repeat bone density; slight kyphosis noted as previously listed with dx of OP

## 2022-06-10 NOTE — Assessment & Plan Note (Signed)
Chronic, stable Goal of 129/79 or less; at goal Continue previous medications - farxiga 10 - lasix 20 - losartan 25 - metop xl 50

## 2022-06-10 NOTE — Assessment & Plan Note (Signed)
Recommendation for low dose lung CT for early detection; orders placed

## 2022-06-10 NOTE — Assessment & Plan Note (Signed)
Due for screening for mammogram, denies breast concerns, provided with phone number to call and schedule appointment for mammogram. Encouraged to repeat breast cancer screening every 1-2 years.  

## 2022-06-10 NOTE — Assessment & Plan Note (Signed)
Chronic, potentially worsening Pt notes primary use of w/c at home given the fact that "she can get more done" Continue to recommend follow up cardiologist to assist with best care BP remains at goal Normal cardiac and pulmonary exam

## 2022-06-10 NOTE — Patient Instructions (Signed)
Please call and schedule your mammogram and DEXA [bone density screening]  [facility]  [address], [place],  [place]  [address] Get Driving Directions Main: [phone]  Sunday:Closed Monday:7:20 AM - 5:00 PM Tuesday:7:20 AM - 5:00 PM Wednesday:7:20 AM - 5:00 PM Thursday:7:20 AM - 5:00 PM Friday:7:20 AM - 4:30 PM Saturday:Closed

## 2022-06-11 LAB — BASIC METABOLIC PANEL
BUN/Creatinine Ratio: 25 (ref 12–28)
BUN: 32 mg/dL — ABNORMAL HIGH (ref 8–27)
CO2: 22 mmol/L (ref 20–29)
Calcium: 9.8 mg/dL (ref 8.7–10.3)
Chloride: 101 mmol/L (ref 96–106)
Creatinine, Ser: 1.27 mg/dL — ABNORMAL HIGH (ref 0.57–1.00)
Glucose: 120 mg/dL — ABNORMAL HIGH (ref 70–99)
Potassium: 4.7 mmol/L (ref 3.5–5.2)
Sodium: 143 mmol/L (ref 134–144)
eGFR: 44 mL/min/{1.73_m2} — ABNORMAL LOW (ref >59)

## 2022-06-11 LAB — CBC
Hematocrit: 41.4 % (ref 34.0–46.6)
Hemoglobin: 13.4 g/dL (ref 11.1–15.9)
MCH: 29.3 pg (ref 26.6–33.0)
MCHC: 32.4 g/dL (ref 31.5–35.7)
MCV: 90 fL (ref 79–97)
Platelets: 225 10*3/uL (ref 150–450)
RBC: 4.58 x10E6/uL (ref 3.77–5.28)
RDW: 13.1 % (ref 11.7–15.4)
WBC: 9.9 10*3/uL (ref 3.4–10.8)

## 2022-06-11 LAB — LIPID PANEL
Chol/HDL Ratio: 2.3 ratio (ref 0.0–4.4)
Cholesterol, Total: 155 mg/dL (ref 100–199)
HDL: 68 mg/dL (ref >39)
LDL Chol Calc (NIH): 61 mg/dL (ref 0–99)
Triglycerides: 157 mg/dL — ABNORMAL HIGH (ref 0–149)
VLDL Cholesterol Cal: 26 mg/dL (ref 5–40)

## 2022-06-11 LAB — HEMOGLOBIN A1C
Est. average glucose Bld gHb Est-mCnc: 174 mg/dL
Hgb A1c MFr Bld: 7.7 % — ABNORMAL HIGH (ref 4.8–5.6)

## 2022-06-11 LAB — MICROALBUMIN / CREATININE URINE RATIO
Creatinine, Urine: 38.2 mg/dL
Microalb/Creat Ratio: 87 mg/g creat — ABNORMAL HIGH (ref 0–29)
Microalbumin, Urine: 33.2 ug/mL

## 2022-06-11 NOTE — Progress Notes (Signed)
A1c remains stable; however, not at goal of <7%. Continue to recommend balanced, lower carb meals. Smaller meal size, adding snacks. Choosing water as drink of choice and increasing purposeful exercise. Continue to work with Trinna Post for further medication adjustments. All other labs are stable. Urine pending.

## 2022-06-11 NOTE — Progress Notes (Signed)
Urine micro remains elevated; however, improved from previous labs 6 months ago.

## 2022-06-15 ENCOUNTER — Encounter: Payer: Self-pay | Admitting: Family Medicine

## 2022-06-15 DIAGNOSIS — E119 Type 2 diabetes mellitus without complications: Secondary | ICD-10-CM | POA: Diagnosis not present

## 2022-06-15 DIAGNOSIS — Z961 Presence of intraocular lens: Secondary | ICD-10-CM | POA: Diagnosis not present

## 2022-06-15 DIAGNOSIS — H353131 Nonexudative age-related macular degeneration, bilateral, early dry stage: Secondary | ICD-10-CM | POA: Diagnosis not present

## 2022-06-15 LAB — HM DIABETES EYE EXAM

## 2022-06-16 ENCOUNTER — Other Ambulatory Visit: Payer: Self-pay | Admitting: Family Medicine

## 2022-06-16 DIAGNOSIS — F419 Anxiety disorder, unspecified: Secondary | ICD-10-CM

## 2022-06-16 DIAGNOSIS — I5022 Chronic systolic (congestive) heart failure: Secondary | ICD-10-CM

## 2022-06-21 ENCOUNTER — Telehealth: Payer: Self-pay

## 2022-06-21 ENCOUNTER — Encounter: Payer: Self-pay | Admitting: *Deleted

## 2022-06-21 NOTE — Progress Notes (Signed)
Care Management & Coordination Services Pharmacy Team Pharmacy Assistant   Name: Andrea [name]  MRN: [id number] DOB: November 14, 1945  Reason for Encounter: Medication coordination and Delivery for [facility]  Contacted patient to discuss medications and coordinate delivery from [facility]. Spoke with patient on 06/21/2022   Chart review: Recent office visits:  06/10/2022 Andrea Norton, FNP (PCP Office Visit) for Follow-up- No medication changes noted, Lab orders placed, Bone Density order placed, Mammogram order placed, Referral to Gastroenterology order placed, Referral for Lung Cancer Screening order placed, BP: 127/59, Patient to follow-up in 3 months  Recent consult visits:  None ID  Hospital visits:  None in previous 6 months  Medications: Outpatient Encounter Medications as of 06/21/2022  Medication Sig   Alcohol Swabs (ALCOHOL PADS) 70 % PADS 1 each by Does not apply route in the morning and at bedtime.   atorvastatin (LIPITOR) 20 MG tablet Take 1 tablet (20 mg total) by mouth at bedtime.   Calcium Carbonate-Vitamin D 600-400 MG-UNIT tablet Take 1 tablet by mouth daily.   clobetasol (TEMOVATE) 0.05 % external solution Apply 1 application. topically 2 (two) times daily.   Continuous Blood Gluc Sensor (FREESTYLE LIBRE 3 SENSOR) MISC Use to monitor blood sugars continuously as directed.   Cyanocobalamin (B-12) 1000 MCG CAPS Take 1,000 mcg by mouth daily.   dapagliflozin propanediol (FARXIGA) 10 MG TABS tablet Take 1 tablet (10 mg total) by mouth daily. TAKE ONE TABLET BY MOUTH ONCE DAILY BEFORE BREAKFAST   furosemide (LASIX) 20 MG tablet TAKE ONE TABLET BY MOUTH ONCE DAILY   hydrOXYzine (ATARAX) 25 MG tablet TAKE 1 TABLET BY MOUTH THREE TIMES A DAY AS NEEDED   insulin degludec (TRESIBA FLEXTOUCH) 100 UNIT/ML FlexTouch Pen Inject 28 Units into the skin daily. Patient receives via [employer] Patient Assistance through December 2024   Insulin Pen Needle (TRUEPLUS PEN  NEEDLES) 31G X 6 MM MISC Use with lantus two times daily.   INSULIN SYRINGE 1CC/29G 29G X 1/2" 1 ML MISC For lantus injections twice daily   losartan (COZAAR) 25 MG tablet TAKE ONE TABLET BY MOUTH ONCE DAILY   metoprolol succinate (TOPROL-XL) 50 MG 24 hr tablet TAKE ONE TABLET BY MOUTH ONCE DAILY   montelukast (SINGULAIR) 10 MG tablet Take 1 tablet (10 mg total) by mouth at bedtime.   Multiple Vitamins-Minerals (ICAPS AREDS 2 PO) Take by mouth.   Multiple Vitamins-Minerals (PRESERVISION AREDS 2 PO) Take 1 tablet by mouth in the morning and at bedtime.   Omega-3 1000 MG CAPS Take 1,000 mg by mouth daily.   omeprazole (PRILOSEC) 40 MG capsule TAKE ONE CAPSULE BY MOUTH EVERY MORNING   OneTouch Delica Lancets 33G MISC To check blood sugar three times daily  DX: E11.42   ONETOUCH VERIO test strip Use to check blood sugar three times daily. DX E11.42   oxybutynin (DITROPAN-XL) 10 MG 24 hr tablet TAKE ONE TABLET BY MOUTH EVERYDAY AT BEDTIME   Semaglutide,0.25 or 0.5MG /DOS, (OZEMPIC, 0.25 OR 0.5 MG/DOSE,) 2 MG/3ML SOPN Inject 0.5 mg into the skin once a week. Patient receives via [employer] Patient Assistance through December 2024   sertraline (ZOLOFT) 25 MG tablet TAKE ONE TABLET BY MOUTH ONCE DAILY   Vitamin D, Ergocalciferol, (DRISDOL) 1.25 MG (50000 UNIT) CAPS capsule TAKE ONE CAPSULE BY MOUTH ONCE WEEKLY ON FRIDAY   No facility-administered encounter medications on file as of 06/21/2022.   BP Readings from Last 3 Encounters:  06/10/22 (!) 127/59  03/10/22 (!) 122/55  12/10/21 [name] Kitchen)  127/56    Pulse Readings from Last 3 Encounters:  06/10/22 92  03/10/22 92  12/10/21 91    Lab Results  Component Value Date/Time   HGBA1C 7.7 (H) 06/10/2022 11:20 AM   HGBA1C 7.6 (H) 03/10/2022 11:38 AM   HGBA1C 11.3 06/03/2021 04:44 PM   HGBA1C 7.5 (H) 11/26/2013 04:14 AM   Lab Results  Component Value Date   CREATININE 1.27 (H) 06/10/2022   BUN 32 (H) 06/10/2022   GFRNONAA 46 (L) 12/22/2020   GFRAA  70 02/11/2020   NA 143 06/10/2022   K 4.7 06/10/2022   CALCIUM 9.8 06/10/2022   CO2 22 06/10/2022   Cycle dispensing form sent to Andrea [name], CTL for review.   Last adherence delivery date:  06/01/2022  Patient is due for next adherence delivery on:  07/01/2022 1st Route   This delivery to include: Adherence Packaging  30 Days  Atorvastatin 20 mg 1 tablet daily (Bedtime) Furosemide 20 mg 1 tablet daily (Breakfast) Losartan 25 mg 1 tablet daily (Breakfast) Metoprolol XL 50 mg 1 tablet daily (Breakfast) Omeprazole  40 mg 1 tablet daily (Breakfast) Oxybutynin XL 10 mg 1 tablet daily (Bedtime) Montelukast 10 mg 1 tablet daily (Bedtime) Vitamin D 50,000 U 1 tablet weekly (on Fridays) Sertraline 25 mg 1 tablet daily (Breakfast) Farxiga 10 mg 1 tablet daily (Before Breakfast) OneTouch Strips OneTouch Lancets  Patient declined the following medications this month: No medications were declined  No refill request needed.  Confirmed delivery date of 07/01/2022 1st Route, advised patient that pharmacy will contact them the morning of delivery.  Any concerns about your medications? No  How often do you forget or accidentally miss a dose? Never  Do you use a pillbox? No  Is patient in packaging Yes    Recent blood pressure readings are as follows: 127/59  Recent blood glucose readings are as follows: 120 this morning fasting  Patient has follow-up with [facility] on 08/23/2022 @ 1100.  Andrea [name], CPA/CMA Clinical Pharmacist Assistant Phone: [phone]

## 2022-07-05 ENCOUNTER — Ambulatory Visit (INDEPENDENT_AMBULATORY_CARE_PROVIDER_SITE_OTHER): Payer: Medicare Other

## 2022-07-05 VITALS — Ht 60.0 in | Wt 139.0 lb

## 2022-07-05 DIAGNOSIS — Z0000 Encounter for general adult medical examination without abnormal findings: Secondary | ICD-10-CM | POA: Diagnosis not present

## 2022-07-05 NOTE — Patient Instructions (Signed)
Andrea [name] , Thank you for taking time to come for your Medicare Wellness Visit. I appreciate your ongoing commitment to your health goals. Please review the following plan we discussed and let me know if I can assist you in the future.   These are the goals we discussed:  Goals      Decrease soda or juice intake     Recommend decreasing caffeine by 2 glasses a dayand drinking 3 glasses of water a day as a subsitute.     DIET - EAT MORE FRUITS AND VEGETABLES     Goal: CCM (Diabetes) Expected Outcome:  Monitor, Self-Manage And Reduce Symptoms of Diabetes     Current Barriers:  Chronic Disease Management support and education needs related to Diabetes  Planned Interventions: Reviewed plan for diabetes management. Reports compliance with treatment plan. Reports taking medications as prescribed. Reviewed blood glucose readings. Reports one elevated fasting reading of 148 within the last few weeks d/t eating out at a buffet the evening prior. Reports remaining fasting readings have ranged from the 80s to 120s. Reading was 123 mg/dl today. Previously discussed options for a continuous glucose monitor. She prefers to continue use of the standard glucometer. Agreed to update the Care Management Pharmacist if this changes. Denies recent hypoglycemic or hyperglycemic episodes.  Reviewed nutritional intake. Reports attempting to adhere to a heart healthy diabetic diet. Reports eating out occasionally. Family remains available to assist with preparing meals. Declines current need for additional nutritional resources. Reviewed preventive health exams. Reports not completing eye exam as planned. Will assist with scheduling for [facility]/Dr. Dingledein.    Lab Results  Component Value Date   HGBA1C 7.6 (H) 03/10/2022     Symptom Management: Take medications as prescribed   Attend all scheduled provider appointments Call pharmacy for medication refills 3-7 days in advance of running out of  medications Check blood sugars daily  Keep a blood sugar log Check feet daily for cuts, sores or redness Wash and dry feet carefully every day Wear comfortable, cotton socks Wear comfortable, well-fitting shoes Read food labels for fat, fiber, carbohydrates and portion size Call provider office for new concerns or questions    Follow Up Plan:  Will follow up next month       Goal: CCM (Hypertension) Expected Outcome:  Monitor, Self-Manage And Reduce Symptoms of Hypertension     Current Barriers:  Chronic Disease Management support and education needs related to Hypertension  Planned Interventions: Reviewed current treatment plan for hypertension management. Reports taking medications as prescribed. Reports receiving all needed medications via recent delivery from [facility]. Reviewed information regarding established blood pressure parameters. Reports not recently monitoring d/t a family member having her BP cuff. Agreed to start monitoring at least a few times a week. Advised to keep a log. Reviewed symptoms. Denies chest pain or palpitations. Denies headaches, dizziness, or visual changes. Denies changes or decline in activity tolerance. Reviewed nutritional intake. Reports attempting to adhere to a heart healthy diet but not consistently monitoring nutrition labels. Reports eating out occasionally. Encouraged to read nutrition labels, continue monitoring sodium intake, and avoid highly processed foods when possible.  Reviewed s/sx of heart attack, stroke and worsening symptoms that require immediate medical attention.    BP Readings from Last 3 Encounters:  03/10/22 (!) 122/55  12/10/21 (!) 127/56  09/03/21 101/63     Symptom Management: Take medications as prescribed   Attend all scheduled provider appointments Call pharmacy for medication refills 3-7 days in advance  of running out of medications Call provider office for new concerns or questions  Check blood  pressure at least a few times a week Keep a blood pressure log Call doctor for signs and symptoms of high blood pressure Report new symptoms to your doctor Eat whole grains, fruits and vegetables, lean meats and healthy fats Limit salt intake   Follow Up Plan:  Will follow up next month       Have 3 meals a day     Recommend to decrease portion sizes by eating 3 small healthy meals and at least 2 healthy snacks per day.       Monitor and Manage My Blood Sugar-Diabetes Type 2     Timeframe:  Long-Range Goal Priority:  High Start Date: 06/23/2021                            Expected End Date: 07/07/2022                      Follow Up within 30 days   - check blood sugar once daily before breakfast - check blood sugar if I feel it is too high or too low - enter blood sugar readings and medication or insulin into daily log - take the blood sugar log to all doctor visits    Why is this important?   Checking your blood sugar at home helps to keep it from getting very high or very low.  Writing the results in a diary or log helps the doctor know how to care for you.  Your blood sugar log should have the time, date and the results.  Also, write down the amount of insulin or other medicine that you take.  Other information, like what you ate, exercise done and how you were feeling, will also be helpful.     Notes:         This is a list of the screening recommended for you and due dates:  Health Maintenance  Topic Date Due   DTaP/Tdap/Td vaccine (1 - Tdap) Never done   Screening for Lung Cancer  Never done   DEXA scan (bone density measurement)  03/07/2020   Complete foot exam   03/06/2021   COVID-19 Vaccine (4 - 2023-24 season) 11/06/2021   Colon Cancer Screening  02/07/2022   Pneumonia Vaccine (2 of 2 - PCV) 06/03/2022   Hemoglobin A1C  09/09/2022   Flu Shot  10/07/2022   Yearly kidney function blood test for diabetes  06/10/2023   Yearly kidney health urinalysis for  diabetes  06/10/2023   Eye exam for diabetics  06/15/2023   Medicare Annual Wellness Visit  07/05/2023   Hepatitis C Screening: USPSTF Recommendation to screen - Ages 18-79 yo.  Completed   Zoster (Shingles) Vaccine  Completed   HPV Vaccine  Aged Out    Advanced directives: yes  Conditions/risks identified: low falls risk  Next appointment: Follow up in one year for your annual wellness visit 07/06/2023 @1 :30pm telephone   Preventive Care 65 Years and Older, Female Preventive care refers to lifestyle choices and visits with your health care provider that can promote health and wellness. What does preventive care include? A yearly physical exam. This is also called an annual well check. Dental exams once or twice a year. Routine eye exams. Ask your health care provider how often you should have your eyes checked. Personal lifestyle choices, including: Daily care  of your teeth and gums. Regular physical activity. Eating a healthy diet. Avoiding tobacco and drug use. Limiting alcohol use. Practicing safe sex. Taking low-dose aspirin every day. Taking vitamin and mineral supplements as recommended by your health care provider. What happens during an annual well check? The services and screenings done by your health care provider during your annual well check will depend on your age, overall health, lifestyle risk factors, and family history of disease. Counseling  Your health care provider may ask you questions about your: Alcohol use. Tobacco use. Drug use. Emotional well-being. Home and relationship well-being. Sexual activity. Eating habits. History of falls. Memory and ability to understand (cognition). Work and work Astronomer. Reproductive health. Screening  You may have the following tests or measurements: Height, weight, and BMI. Blood pressure. Lipid and cholesterol levels. These may be checked every 5 years, or more frequently if you are over 50 years  old. Skin check. Lung cancer screening. You may have this screening every year starting at age 59 if you have a 30-pack-year history of smoking and currently smoke or have quit within the past 15 years. Fecal occult blood test (FOBT) of the stool. You may have this test every year starting at age 23. Flexible sigmoidoscopy or colonoscopy. You may have a sigmoidoscopy every 5 years or a colonoscopy every 10 years starting at age 55. Hepatitis C blood test. Hepatitis B blood test. Sexually transmitted disease (STD) testing. Diabetes screening. This is done by checking your blood sugar (glucose) after you have not eaten for a while (fasting). You may have this done every 1-3 years. Bone density scan. This is done to screen for osteoporosis. You may have this done starting at age 77. Mammogram. This may be done every 1-2 years. Talk to your health care provider about how often you should have regular mammograms. Talk with your health care provider about your test results, treatment options, and if necessary, the need for more tests. Vaccines  Your health care provider may recommend certain vaccines, such as: Influenza vaccine. This is recommended every year. Tetanus, diphtheria, and acellular pertussis (Tdap, Td) vaccine. You may need a Td booster every 10 years. Zoster vaccine. You may need this after age 45. Pneumococcal 13-valent conjugate (PCV13) vaccine. One dose is recommended after age 72. Pneumococcal polysaccharide (PPSV23) vaccine. One dose is recommended after age 68. Talk to your health care provider about which screenings and vaccines you need and how often you need them. This information is not intended to replace advice given to you by your health care provider. Make sure you discuss any questions you have with your health care provider. Document Released: 03/21/2015 Document Revised: 11/12/2015 Document Reviewed: 12/24/2014 Elsevier Interactive Patient Education  2017 [employer].  Fall Prevention in the Home Falls can cause injuries. They can happen to people of all ages. There are many things you can do to make your home safe and to help prevent falls. What can I do on the outside of my home? Regularly fix the edges of walkways and driveways and fix any cracks. Remove anything that might make you trip as you walk through a door, such as a raised step or threshold. Trim any bushes or trees on the path to your home. Use bright outdoor lighting. Clear any walking paths of anything that might make someone trip, such as rocks or tools. Regularly check to see if handrails are loose or broken. Make sure that both sides of any steps have handrails. Any raised  decks and porches should have guardrails on the edges. Have any leaves, snow, or ice cleared regularly. Use sand or salt on walking paths during winter. Clean up any spills in your garage right away. This includes oil or grease spills. What can I do in the bathroom? Use night lights. Install grab bars by the toilet and in the tub and shower. Do not use towel bars as grab bars. Use non-skid mats or decals in the tub or shower. If you need to sit down in the shower, use a plastic, non-slip stool. Keep the floor dry. Clean up any water that spills on the floor as soon as it happens. Remove soap buildup in the tub or shower regularly. Attach bath mats securely with double-sided non-slip rug tape. Do not have throw rugs and other things on the floor that can make you trip. What can I do in the bedroom? Use night lights. Make sure that you have a light by your bed that is easy to reach. Do not use any sheets or blankets that are too big for your bed. They should not hang down onto the floor. Have a firm chair that has side arms. You can use this for support while you get dressed. Do not have throw rugs and other things on the floor that can make you trip. What can I do in the kitchen? Clean up any spills right  away. Avoid walking on wet floors. Keep items that you use a lot in easy-to-reach places. If you need to reach something above you, use a strong step stool that has a grab bar. Keep electrical cords out of the way. Do not use floor polish or wax that makes floors slippery. If you must use wax, use non-skid floor wax. Do not have throw rugs and other things on the floor that can make you trip. What can I do with my stairs? Do not leave any items on the stairs. Make sure that there are handrails on both sides of the stairs and use them. Fix handrails that are broken or loose. Make sure that handrails are as long as the stairways. Check any carpeting to make sure that it is firmly attached to the stairs. Fix any carpet that is loose or worn. Avoid having throw rugs at the top or bottom of the stairs. If you do have throw rugs, attach them to the floor with carpet tape. Make sure that you have a light switch at the top of the stairs and the bottom of the stairs. If you do not have them, ask someone to add them for you. What else can I do to help prevent falls? Wear shoes that: Do not have high heels. Have rubber bottoms. Are comfortable and fit you well. Are closed at the toe. Do not wear sandals. If you use a stepladder: Make sure that it is fully opened. Do not climb a closed stepladder. Make sure that both sides of the stepladder are locked into place. Ask someone to hold it for you, if possible. Clearly mark and make sure that you can see: Any grab bars or handrails. First and last steps. Where the edge of each step is. Use tools that help you move around (mobility aids) if they are needed. These include: Canes. Walkers. Scooters. Crutches. Turn on the lights when you go into a dark area. Replace any light bulbs as soon as they burn out. Set up your furniture so you have a clear path. Avoid moving your furniture around.  If any of your floors are uneven, fix them. If there are any  pets around you, be aware of where they are. Review your medicines with your doctor. Some medicines can make you feel dizzy. This can increase your chance of falling. Ask your doctor what other things that you can do to help prevent falls. This information is not intended to replace advice given to you by your health care provider. Make sure you discuss any questions you have with your health care provider. Document Released: 12/19/2008 Document Revised: 07/31/2015 Document Reviewed: 03/29/2014 Elsevier Interactive Patient Education  2017 [employer].

## 2022-07-05 NOTE — Progress Notes (Signed)
I connected with  Andrea [name] on 07/05/22 by a audio enabled telemedicine application and verified that I am speaking with the correct person using two identifiers.  Patient Location: Home  Provider Location: Home Office  I discussed the limitations of evaluation and management by telemedicine. The patient expressed understanding and agreed to proceed.  Subjective:   Andrea [name] is a 77 y.o. female who presents for Medicare Annual (Subsequent) preventive examination.  Review of Systems    Cardiac Risk Factors include: advanced age (>52men, >21 women);diabetes mellitus;dyslipidemia;hypertension;sedentary lifestyle    Objective:    Today's Vitals   07/05/22 1333  Weight: 139 lb (63 kg)  Height: 5' (1.524 m)   Body mass index is 27.15 kg/m.     07/05/2022    1:42 PM 06/30/2021    1:05 PM 12/18/2020    9:00 PM 12/18/2020   10:20 AM 12/01/2020    4:12 PM 11/22/2020    7:00 PM 11/20/2020    3:35 PM  Advanced Directives  Does Patient Have a Medical Advance Directive? Yes Yes Yes Yes  Yes Yes  Type of Estate agent of [place];Living will Living will;Healthcare Power of [employer] Power of [employer] Power of [employer] Power of Attorney Healthcare Power of Attorney  Does patient want to make changes to medical advance directive?  Yes (Inpatient - patient defers changing a medical advance directive and declines information at this time) No - Guardian declined No - Patient declined  No - Patient declined   Copy of Healthcare Power of Attorney in Chart?  Yes - validated most recent copy scanned in chart (See row information) Yes - validated most recent copy scanned in chart (See row information) No - copy requested  No - copy requested   Would patient like information on creating a medical advance directive?    No - Patient declined       Current Medications (verified) Outpatient Encounter Medications as of 07/05/2022  Medication  Sig   Alcohol Swabs (ALCOHOL PADS) 70 % PADS 1 each by Does not apply route in the morning and at bedtime.   atorvastatin (LIPITOR) 20 MG tablet Take 1 tablet (20 mg total) by mouth at bedtime.   Calcium Carbonate-Vitamin D 600-400 MG-UNIT tablet Take 1 tablet by mouth daily.   clobetasol (TEMOVATE) 0.05 % external solution Apply 1 application. topically 2 (two) times daily.   Continuous Blood Gluc Sensor (FREESTYLE LIBRE 3 SENSOR) MISC Use to monitor blood sugars continuously as directed.   Cyanocobalamin (B-12) 1000 MCG CAPS Take 1,000 mcg by mouth daily.   dapagliflozin propanediol (FARXIGA) 10 MG TABS tablet Take 1 tablet (10 mg total) by mouth daily. TAKE ONE TABLET BY MOUTH ONCE DAILY BEFORE BREAKFAST   furosemide (LASIX) 20 MG tablet TAKE ONE TABLET BY MOUTH ONCE DAILY   hydrOXYzine (ATARAX) 25 MG tablet TAKE 1 TABLET BY MOUTH THREE TIMES A DAY AS NEEDED   insulin degludec (TRESIBA FLEXTOUCH) 100 UNIT/ML FlexTouch Pen Inject 28 Units into the skin daily. Patient receives via [employer] Patient Assistance through December 2024   Insulin Pen Needle (TRUEPLUS PEN NEEDLES) 31G X 6 MM MISC Use with lantus two times daily.   INSULIN SYRINGE 1CC/29G 29G X 1/2" 1 ML MISC For lantus injections twice daily   losartan (COZAAR) 25 MG tablet TAKE ONE TABLET BY MOUTH ONCE DAILY   metoprolol succinate (TOPROL-XL) 50 MG 24 hr tablet TAKE ONE TABLET BY MOUTH ONCE DAILY   montelukast (  SINGULAIR) 10 MG tablet Take 1 tablet (10 mg total) by mouth at bedtime.   Multiple Vitamins-Minerals (ICAPS AREDS 2 PO) Take by mouth.   Multiple Vitamins-Minerals (PRESERVISION AREDS 2 PO) Take 1 tablet by mouth in the morning and at bedtime.   Omega-3 1000 MG CAPS Take 1,000 mg by mouth daily.   omeprazole (PRILOSEC) 40 MG capsule TAKE ONE CAPSULE BY MOUTH EVERY MORNING   OneTouch Delica Lancets 33G MISC To check blood sugar three times daily  DX: E11.42   ONETOUCH VERIO test strip Use to check blood sugar three times  daily. DX E11.42   oxybutynin (DITROPAN-XL) 10 MG 24 hr tablet TAKE ONE TABLET BY MOUTH EVERYDAY AT BEDTIME   Semaglutide,0.25 or 0.5MG /DOS, (OZEMPIC, 0.25 OR 0.5 MG/DOSE,) 2 MG/3ML SOPN Inject 0.5 mg into the skin once a week. Patient receives via [employer] Patient Assistance through December 2024   sertraline (ZOLOFT) 25 MG tablet TAKE ONE TABLET BY MOUTH ONCE DAILY   Vitamin D, Ergocalciferol, (DRISDOL) 1.25 MG (50000 UNIT) CAPS capsule TAKE ONE CAPSULE BY MOUTH ONCE WEEKLY ON FRIDAY   No facility-administered encounter medications on file as of 07/05/2022.    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   Depression    Diabetes mellitus type 2, uncomplicated (HCC)    Diabetic retinopathy (HCC)    Dupuytren contracture 2011   s/p surgery to LEFT hand   Frequent urinary tract infections    GERD (gastroesophageal reflux disease)    Grade I diastolic dysfunction    HTN (hypertension)    Hyperlipidemia, unspecified    Irritable bowel syndrome    Meningitis 11/2019   MI (myocardial infarction) (HCC) 1994   no stent   MRSA (methicillin resistant staph aureus) culture positive    Neuromuscular disorder (HCC)    nerve pain in back and legs   Osteoporosis    Pneumonia 2021   RBBB    Sepsis (HCC) 11/2019   Skin cancer    TIA (transient ischemic attack) 2014   Vitamin D deficiency, unspecified    Wheezing    Past Surgical History:  Procedure Laterality Date   BACK SURGERY     lower due to spinal stenosis   BONE BIOPSY Left 11/20/2020   Procedure: BONE BIOPSY;  Surgeon: Felecia Shelling, DPM;  Location: [facility];  Service: Podiatry;  Laterality:  Left;   CARDIAC CATHETERIZATION  1994   CATARACT EXTRACTION, BILATERAL Bilateral    COLONOSCOPY     x3   COLONOSCOPY WITH PROPOFOL N/A 02/08/2019   Procedure: COLONOSCOPY WITH PROPOFOL;  Surgeon: Wyline Mood, MD;  Location: [facility];  Service: Gastroenterology;  Laterality: N/A;   CORONARY ANGIOPLASTY  1994   DUPUYTREN CONTRACTURE RELEASE Left 2011   HARDWARE REMOVAL Left 08/19/2020   Procedure: HARDWARE REMOVAL;  Surgeon: Kennedy Bucker, MD;  Location: [facility];  Service: Orthopedics;  Laterality: Left;   HIP FRACTURE SURGERY  11/2013   IRRIGATION AND DEBRIDEMENT FOOT Left 11/20/2020   Procedure: IRRIGATION AND DEBRIDEMENT FOOT-Heel Ulcer;  Surgeon: Felecia Shelling, DPM;  Location: [facility];  Service: Podiatry;  Laterality: Left;  PR COLSC FLX W/REMOVAL LESION BY HOT BX FORCEPS   08/21/2015   Procedure: COLONOSCOPY, FLEXIBLE, PROXIMAL TO SPLENIC FLEXURE; W/REMOVAL TUMOR/POLYP/OTHER LESION, HOT BX FORCEP/CAUTE; Surgeon: Kandyce Rud, MD; Location: [facility]; Service: General Surgery   REPAIR DURAL / CSF LEAK     to left sinus   TEE WITHOUT CARDIOVERSION N/A 11/20/2019   Procedure: TRANSESOPHAGEAL ECHOCARDIOGRAM (TEE);  Surgeon: Yvonne Kendall, MD;  Location: [facility];  Service: Cardiovascular;  Laterality: N/A;   TOTAL HIP ARTHROPLASTY Left 08/19/2020   Procedure: TOTAL HIP ARTHROPLASTY ANTERIOR APPROACH;  Surgeon: Kennedy Bucker, MD;  Location: [facility];  Service: Orthopedics;  Laterality: Left;   Family History  Problem Relation Age of Onset   Depression Daughter    Arthritis Daughter        spine   Plantar fasciitis Daughter    Anxiety disorder Daughter    Hyperlipidemia Daughter    AAA (abdominal aortic aneurysm) Daughter    Hypertension Mother    Stroke Mother    Cataracts Mother    Depression Mother    Heart disease Mother    Asthma Brother    Diabetes Daughter    Hyperlipidemia Daughter    Hypertension Daughter    Breast cancer Cousin        maternal    Social History   Socioeconomic History   Marital status: Married    Spouse name: Regan Rakers   Number of children: 3   Years of education: 8   Highest education level: 8th grade  Occupational History   Occupation: retired  Tobacco Use   Smoking status: Former    Packs/day: 1.00    Years: 30.00    Additional pack years: 0.00    Total pack years: 30.00    Types: Cigarettes    Quit date: 07/17/2012    Years since quitting: 9.9   Smokeless tobacco: Never   Tobacco comments:       Vaping Use   Vaping Use: Never used  Substance and Sexual Activity   Alcohol use: Not Currently    Alcohol/week: 0.0 standard drinks of alcohol   Drug use: No   Sexual activity: Not Currently  Other Topics Concern   Not on file  Social History Narrative   Not on file   Social Determinants of Health   Financial Resource Strain: Low Risk  (07/05/2022)   Overall Financial Resource Strain (CARDIA)    Difficulty of Paying Living Expenses: Not very hard  Food Insecurity: No Food Insecurity (07/05/2022)   Hunger Vital Sign    Worried About Running Out of Food in the Last Year: Never true    Ran Out of Food in the Last Year: Never true  Transportation Needs: No Transportation Needs (07/05/2022)   PRAPARE - Administrator, Civil Service (Medical): No    Lack of Transportation (Non-Medical): No  Physical Activity: Insufficiently Active (07/05/2022)   Exercise Vital Sign    Days of Exercise per Week: 3 days    Minutes of Exercise per Session: 30 min  Stress: No Stress Concern Present (07/05/2022)   [employer] of Occupational Health - Occupational Stress Questionnaire    Feeling of Stress : Not at all  Social Connections: Moderately Isolated (07/05/2022)   Social Connection and Isolation Panel [NHANES]    Frequency of Communication with Friends and Family: More than three times a week    Frequency of Social Gatherings with Friends and Family: Once a week    Attends Religious Services:  Never    Active Member of Clubs or Organizations: No    Attends Banker Meetings: Never    Marital Status: Married    Tobacco Counseling Counseling given: Not Answered Tobacco comments:     Clinical Intake:  Pre-visit preparation completed: Yes  Pain : No/denies pain   BMI - recorded: 27.15 Nutritional Status: BMI 25 -29 Overweight Nutritional Risks: None Diabetes: Yes CBG done?: Yes (BS 91 this am at home) CBG resulted in Enter/ Edit results?: No Did pt. bring in CBG monitor from home?: No  How often do you need to have someone help you when you read instructions, pamphlets, or other written materials from your doctor or pharmacy?: 1 - Never  Diabetic?yes    Comments: lives with husband Information entered by :: B.Ebrahim Deremer,LPN   Activities of Daily Living    07/05/2022    1:43 PM 06/10/2022   11:02 AM  In your present state of health, do you have any difficulty performing the following activities:  Hearing? 0 0  Vision? 0 0  Difficulty concentrating or making decisions? 0 0  Walking or climbing stairs? 1 1  Dressing or bathing? 1 1  Doing errands, shopping? 1 1  Preparing Food and eating ? N   Using the Toilet? N   In the past six months, have you accidently leaked urine? N   Do you have problems with loss of bowel control? N   Managing your Medications? N   Managing your Finances? N   Housekeeping or managing your Housekeeping? N     Patient Care Team: Jacky Kindle, FNP as PCP - General (Family Medicine) Antonieta Iba, MD as PCP - Cardiology (Cardiology) Sallee Lange, MD as Consulting Physician (Ophthalmology) Tressie Stalker, MD as Consulting Physician (Neurosurgery) Wyn Quaker Marlow Baars, MD as Referring Physician (Vascular Surgery) Wyline Mood, MD as Consulting Physician (Gastroenterology) Gaspar Cola, [facility] (Pharmacist) Juanell Fairly, RN as Case Manager  Indicate any recent Medical Services you may have received from other  than [facility] providers in the past year (date may be approximate).     Assessment:   This is a routine wellness examination for Naylee.  Hearing/Vision screen Hearing Screening - Comments:: Adequate hearing Vision Screening - Comments:: Adequate vision ;readers only [facility]  Dietary issues and exercise activities discussed: Current Exercise Habits: Home exercise routine, Time (Minutes): 30, Frequency (Times/Week): 3, Weekly Exercise (Minutes/Week): 90, Exercise limited by: orthopedic condition(s);cardiac condition(s)   Goals Addressed             This Visit's Progress    Decrease soda or juice intake   On track    Recommend decreasing caffeine by 2 glasses a dayand drinking 3 glasses of water a day as a subsitute.     DIET - EAT MORE FRUITS AND VEGETABLES   On track    Have 3 meals a day   No change    Recommend to decrease portion sizes by eating 3 small healthy meals and at least 2 healthy snacks per day.         Depression Screen    07/05/2022    1:40 PM 06/10/2022   11:01 AM 03/17/2022    9:44 AM 12/10/2021   11:21 AM 06/30/2021    1:04 PM 11/17/2020    4:03 PM 06/05/2020   11:20 AM  PHQ 2/9 Scores  PHQ - 2 Score 0 0 0 0 0 0 0  PHQ- 9 Score  0  0   0  Fall Risk    07/05/2022    1:37 PM 06/10/2022   11:01 AM 05/04/2022    1:50 PM 03/17/2022    9:46 AM 12/10/2021   11:21 AM  Fall Risk   Falls in the past year? 0 0 0 0 0  Number falls in past yr: 0 0 0 0 0  Injury with Fall? 0 0 0 0 0  Risk for fall due to : No Fall Risks No Fall Risks Medication side effect;Impaired balance/gait Medication side effect;Impaired balance/gait No Fall Risks  Follow up Education provided;Falls prevention discussed Falls evaluation completed Falls prevention discussed Falls prevention discussed Falls evaluation completed    FALL RISK PREVENTION PERTAINING TO THE HOME:  Any stairs in or around the home? Yes ramp If so, are there any without handrails? Yes  Home free of loose throw  rugs in walkways, pet beds, electrical cords, etc? Yes  Adequate lighting in your home to reduce risk of falls? Yes   ASSISTIVE DEVICES UTILIZED TO PREVENT FALLS:  Life alert? No  Use of a cane, walker or w/c? Yes wheelchair/walker Grab bars in the bathroom? No  Shower chair or bench in shower? Yes  Elevated toilet seat or a handicapped toilet? Yes   Cognitive Function:        07/05/2022    1:48 PM 02/11/2020    1:46 PM 12/28/2018    3:30 PM 12/19/2017    2:38 PM 12/16/2016    2:04 PM  6CIT Screen  What Year? 0 points 0 points 0 points 0 points 0 points  What month? 0 points 0 points 0 points 0 points 0 points  What time? 0 points 0 points 0 points 0 points 0 points  Count back from 20 0 points 0 points 0 points 0 points 0 points  Months in reverse 0 points 0 points 0 points 0 points 2 points  Repeat phrase 0 points 2 points 2 points 4 points 4 points  Total Score 0 points 2 points 2 points 4 points 6 points    Immunizations Immunization History  Administered Date(s) Administered   Fluad Quad(high Dose 65+) 12/01/2020, 12/10/2021   Influenza, High Dose Seasonal PF 02/12/2016, 12/16/2016, 12/19/2017   Influenza,inj,Quad PF,6+ Mos 12/09/2019   PFIZER(Purple Top)SARS-COV-2 Vaccination 07/14/2019, 08/14/2019   Pfizer Covid-19 Vaccine Bivalent Booster 5y-11y 01/27/2021   Pneumococcal Polysaccharide-23 06/02/2021   Zoster Recombinat (Shingrix) 05/07/2018, 11/21/2018    TDAP status: Due, Education has been provided regarding the importance of this vaccine. Advised may receive this vaccine at local pharmacy or Health Dept. Aware to provide a copy of the vaccination record if obtained from local pharmacy or Health Dept. Verbalized acceptance and understanding.  Flu Vaccine status: Up to date  Pneumococcal vaccine status: Up to date  Covid-19 vaccine status: Completed vaccines  Qualifies for Shingles Vaccine? Yes   Zostavax completed Yes   Shingrix Completed?:  Yes  Screening Tests Health Maintenance  Topic Date Due   DTaP/Tdap/Td (1 - Tdap) Never done   Lung Cancer Screening  Never done   DEXA SCAN  03/07/2020   FOOT EXAM  03/06/2021   COVID-19 Vaccine (4 - 2023-24 season) 11/06/2021   COLONOSCOPY (Pts 45-49yrs Insurance coverage will need to be confirmed)  02/07/2022   Pneumonia Vaccine 73+ Years old (2 of 2 - PCV) 06/03/2022   HEMOGLOBIN A1C  09/09/2022   INFLUENZA VACCINE  10/07/2022   Diabetic kidney evaluation - eGFR measurement  06/10/2023   Diabetic kidney evaluation - Urine ACR  06/10/2023   OPHTHALMOLOGY EXAM  06/15/2023   Medicare Annual Wellness (AWV)  07/05/2023   Hepatitis C Screening  Completed   Zoster Vaccines- Shingrix  Completed   HPV VACCINES  Aged Out    Health Maintenance  Health Maintenance Due  Topic Date Due   DTaP/Tdap/Td (1 - Tdap) Never done   Lung Cancer Screening  Never done   DEXA SCAN  03/07/2020   FOOT EXAM  03/06/2021   COVID-19 Vaccine (4 - 2023-24 season) 11/06/2021   COLONOSCOPY (Pts 45-12yrs Insurance coverage will need to be confirmed)  02/07/2022   Pneumonia Vaccine 9+ Years old (2 of 2 - PCV) 06/03/2022    Colorectal cancer screening: No longer required.   Mammogram status: No longer required due to age.  Bone Density status: Completed yes. Results reflect: Bone density results: OSTEOPOROSIS. Repeat every 5 years.  Lung Cancer Screening: (Low Dose CT Chest recommended if Age 50-80 years, 30 pack-year currently smoking OR have quit w/in 15years.) does not qualify.   Lung Cancer Screening Referral: no  Additional Screening:  Hepatitis C Screening: does not qualify; Completed yes  Vision Screening: Recommended annual ophthalmology exams for early detection of glaucoma and other disorders of the eye. Is the patient up to date with their annual eye exam?  Yes  Who is the provider or what is the name of the office in which the patient attends annual eye exams? Dr Dellie Burns If pt is  not established with a provider, would they like to be referred to a provider to establish care? No .   Dental Screening: Recommended annual dental exams for proper oral hygiene  Community Resource Referral / Chronic Care Management: CRR required this visit?  No   CCM required this visit?  No    Plan:     I have personally reviewed and noted the following in the patient's chart:   Medical and social history Use of alcohol, tobacco or illicit drugs  Current medications and supplements including opioid prescriptions. Patient is not currently taking opioid prescriptions. Functional ability and status Nutritional status Physical activity Advanced directives List of other physicians Hospitalizations, surgeries, and ER visits in previous 12 months Vitals Screenings to include cognitive, depression, and falls Referrals and appointments  In addition, I have reviewed and discussed with patient certain preventive protocols, quality metrics, and best practice recommendations. A written personalized care plan for preventive services as well as general preventive health recommendations were provided to patient.   Sue Lush, LPN   06/14/8117   Nurse Notes: The patient states she is doing well and has no concerns or questions at this time.

## 2022-07-06 ENCOUNTER — Encounter: Payer: Medicare Other | Admitting: Family Medicine

## 2022-07-19 ENCOUNTER — Telehealth: Payer: Self-pay

## 2022-07-19 NOTE — Progress Notes (Signed)
Care Management & Coordination Services Pharmacy Team Pharmacy Assistant   Name: Andrea [name]  MRN: [id number] DOB: [birth year]  Reason for Encounter: Medication Coordination and Delivery for [facility]  Contacted patient to discuss medications and coordinate delivery from [facility]. Spoke with patient on 07/19/2022   Chart review: Recent office visits:  07/05/2022 Albertine Grates, LPN (PCP Clinical Support Visit) for Medicare Wellness Exam- No medication changes noted, No orders placed  Recent consult visits:  None ID  Hospital visits:  None in previous 6 months  Medications: Outpatient Encounter Medications as of 07/19/2022  Medication Sig   Alcohol Swabs (ALCOHOL PADS) 70 % PADS 1 each by Does not apply route in the morning and at bedtime.   atorvastatin (LIPITOR) 20 MG tablet Take 1 tablet (20 mg total) by mouth at bedtime.   Calcium Carbonate-Vitamin D 600-400 MG-UNIT tablet Take 1 tablet by mouth daily.   clobetasol (TEMOVATE) 0.05 % external solution Apply 1 application. topically 2 (two) times daily.   Continuous Blood Gluc Sensor (FREESTYLE LIBRE 3 SENSOR) MISC Use to monitor blood sugars continuously as directed.   Cyanocobalamin (B-12) 1000 MCG CAPS Take 1,000 mcg by mouth daily.   dapagliflozin propanediol (FARXIGA) 10 MG TABS tablet Take 1 tablet (10 mg total) by mouth daily. TAKE ONE TABLET BY MOUTH ONCE DAILY BEFORE BREAKFAST   furosemide (LASIX) 20 MG tablet TAKE ONE TABLET BY MOUTH ONCE DAILY   hydrOXYzine (ATARAX) 25 MG tablet TAKE 1 TABLET BY MOUTH THREE TIMES A DAY AS NEEDED   insulin degludec (TRESIBA FLEXTOUCH) 100 UNIT/ML FlexTouch Pen Inject 28 Units into the skin daily. Patient receives via [employer] Patient Assistance through December 2024   Insulin Pen Needle (TRUEPLUS PEN NEEDLES) 31G X 6 MM MISC Use with lantus two times daily.   INSULIN SYRINGE 1CC/29G 29G X 1/2" 1 ML MISC For lantus injections twice daily   losartan (COZAAR) 25 MG  tablet TAKE ONE TABLET BY MOUTH ONCE DAILY   metoprolol succinate (TOPROL-XL) 50 MG 24 hr tablet TAKE ONE TABLET BY MOUTH ONCE DAILY   montelukast (SINGULAIR) 10 MG tablet Take 1 tablet (10 mg total) by mouth at bedtime.   Multiple Vitamins-Minerals (ICAPS AREDS 2 PO) Take by mouth.   Multiple Vitamins-Minerals (PRESERVISION AREDS 2 PO) Take 1 tablet by mouth in the morning and at bedtime.   Omega-3 1000 MG CAPS Take 1,000 mg by mouth daily.   omeprazole (PRILOSEC) 40 MG capsule TAKE ONE CAPSULE BY MOUTH EVERY MORNING   OneTouch Delica Lancets 33G MISC To check blood sugar three times daily  DX: E11.42   ONETOUCH VERIO test strip Use to check blood sugar three times daily. DX E11.42   oxybutynin (DITROPAN-XL) 10 MG 24 hr tablet TAKE ONE TABLET BY MOUTH EVERYDAY AT BEDTIME   Semaglutide,0.25 or 0.5MG /DOS, (OZEMPIC, 0.25 OR 0.5 MG/DOSE,) 2 MG/3ML SOPN Inject 0.5 mg into the skin once a week. Patient receives via [employer] Patient Assistance through December 2024   sertraline (ZOLOFT) 25 MG tablet TAKE ONE TABLET BY MOUTH ONCE DAILY   Vitamin D, Ergocalciferol, (DRISDOL) 1.25 MG (50000 UNIT) CAPS capsule TAKE ONE CAPSULE BY MOUTH ONCE WEEKLY ON FRIDAY   No facility-administered encounter medications on file as of 07/19/2022.   BP Readings from Last 3 Encounters:  06/10/22 (!) 127/59  03/10/22 (!) 122/55  12/10/21 (!) 127/56    Pulse Readings from Last 3 Encounters:  06/10/22 92  03/10/22 92  12/10/21 91    Lab Results  Component Value Date/Time   HGBA1C 7.7 (H) 06/10/2022 11:20 AM   HGBA1C 7.6 (H) 03/10/2022 11:38 AM   HGBA1C 11.3 06/03/2021 04:44 PM   HGBA1C 7.5 (H) 11/26/2013 04:14 AM   Lab Results  Component Value Date   CREATININE 1.27 (H) 06/10/2022   BUN 32 (H) 06/10/2022   GFRNONAA 46 (L) 12/22/2020   GFRAA 70 02/11/2020   NA 143 06/10/2022   K 4.7 06/10/2022   CALCIUM 9.8 06/10/2022   CO2 22 06/10/2022   Cycle dispensing form sent to Leilani Able, [employer]  for review.    Last adherence delivery date:  07/01/2022 1st Route   Patient is due for next adherence delivery on: 07/30/2022 1st Route   This delivery to include: Adherence Packaging  30 Days  Atorvastatin 20 mg 1 tablet daily (Bedtime) Furosemide 20 mg 1 tablet daily (Breakfast) Losartan 25 mg 1 tablet daily (Breakfast) Metoprolol XL 50 mg 1 tablet daily (Breakfast) Omeprazole  40 mg 1 tablet daily (Breakfast) Oxybutynin XL 10 mg 1 tablet daily (Bedtime) Montelukast 10 mg 1 tablet daily (Bedtime) Vitamin D 50,000 U 1 tablet weekly (on Fridays) Sertraline 25 mg 1 tablet daily (Breakfast) Farxiga 10 mg 1 tablet daily (Before Breakfast) OneTouch Strips OneTouch Lancets  Patient declined the following medications this month: No medications were declined  No refill request needed.  Confirmed delivery date of 07/30/2022 1st Route, advised patient that pharmacy will contact them the morning of delivery.  Any concerns about your medications? No  How often do you forget or accidentally miss a dose? Never  Do you use a pillbox? No  Is patient in packaging Yes  I spoke to the patient and she advised that she is doing well, and denies any ill symptoms at this time. Per patient she did advise that she only has one Pen of her Tresiba left and since she takes 28 units daily the patient would be out of [place] by the end of the week. The patient does get her [place] via [employer] patient assistance program so I informed the patient that I would have to call [employer] to get status of renewal for shipment.  I contacted [employer] and the first representative who I spoke with advised that the patient is ready for a refill and she could start the process today to get the order ready for shipment. In the middle of the call the call was disconnected so I did have to call back and the second representative that answered was able to confirm that the order was placed and patient can expect delivery in about 10-14  business days. I informed the representative that per patient she will be out of this medication by Friday and I asked her was there anything we could do to expedite the order. Per the [employer] does not expedite orders and she could only provide a voucher if the order was a delay on their part. I informed her that the representative prior had to go into their system and request the order that it was not done automatically and the patient can not be without the medication for several days. The representative stated that she would place me on hold to see how she could assist but she disconnected the call instead.  I called back to speak with the third representative who did confirm they could not expedite but they could provide the patient a voucher. Per representative the patient would have to call in to request voucher or I  could call the patient and add her into the call to have her request the voucher. I placed representative on hold, added the patient to the call. The patient at that time confirmed that she made a mistake and she did take another look in her refrigerator and she found one more box of [place]. Patient apologized and stated the box was hiding. Representative was on the phone and able to note patient had another box to get her thorough until her new order arrived. Patient was informed that her order of Ozempic and Evaristo Bury would arrive to providers office in 10-14 business days and someone should be calling her when it is available to pick up. Patient verbalized understanding.   Patient has a follow-up with CPP on 08/23/2022 at 1100.  Adelene Idler, CPA/CMA Clinical Pharmacist Assistant Phone: [phone]

## 2022-07-28 ENCOUNTER — Other Ambulatory Visit: Payer: Self-pay | Admitting: Family Medicine

## 2022-07-28 DIAGNOSIS — E785 Hyperlipidemia, unspecified: Secondary | ICD-10-CM

## 2022-07-28 NOTE — Telephone Encounter (Signed)
Requested Prescriptions  Pending Prescriptions Disp Refills   atorvastatin (LIPITOR) 20 MG tablet [Pharmacy Med Name: atorvastatin 20 mg tablet] 90 tablet 1    Sig: TAKE ONE TABLET BY MOUTH EVERYDAY AT BEDTIME     Cardiovascular:  Antilipid - Statins Failed - 07/28/2022 11:09 AM      Failed - Lipid Panel in normal range within the last 12 months    Cholesterol, Total  Date Value Ref Range Status  06/10/2022 155 100 - 199 mg/dL Final   LDL Cholesterol (Calc)  Date Value Ref Range Status  12/16/2016 56 mg/dL (calc) Final    Comment:    Reference range: <100 . Desirable range <100 mg/dL for primary prevention;   <70 mg/dL for patients with CHD or diabetic patients  with > or = 2 CHD risk factors. [name] Kitchen LDL-C is now calculated using the Martin-Hopkins  calculation, which is a validated novel method providing  better accuracy than the Friedewald equation in the  estimation of LDL-C.  Horald Pollen et al. Lenox Ahr. 1610;960(45): 2061-2068  (http://education.[employer].com/faq/FAQ164)    LDL Chol Calc (NIH)  Date Value Ref Range Status  06/10/2022 61 0 - 99 mg/dL Final   HDL  Date Value Ref Range Status  06/10/2022 68 >39 mg/dL Final   Triglycerides  Date Value Ref Range Status  06/10/2022 157 (H) 0 - 149 mg/dL Final         Passed - Patient is not pregnant      Passed - Valid encounter within last 12 months    Recent Outpatient Visits           1 month ago Type 2 diabetes mellitus with diabetic polyneuropathy, without long-term current use of insulin (HCC)   [place] [facility] [name] T, FNP   4 months ago Type 2 diabetes mellitus with diabetic polyneuropathy, without long-term current use of insulin [facility])   [place] [facility] [name] T, FNP   7 months ago Type 2 diabetes mellitus with hyperlipidemia [facility])   [place] [facility] [name] T, FNP   10 months ago Diabetes mellitus with foot ulcer due to  multiple causes [facility])   [place] [facility] [name] T, FNP   1 year ago Type 2 diabetes mellitus with hyperlipidemia [facility])   [place] [facility] Jacky Kindle, FNP       Future Appointments             In 1 month Suzie Portela, Daryl Eastern, FNP [facility]

## 2022-08-10 ENCOUNTER — Ambulatory Visit (INDEPENDENT_AMBULATORY_CARE_PROVIDER_SITE_OTHER): Payer: Medicare Other | Admitting: Podiatry

## 2022-08-10 ENCOUNTER — Encounter: Payer: Self-pay | Admitting: Podiatry

## 2022-08-10 DIAGNOSIS — E119 Type 2 diabetes mellitus without complications: Secondary | ICD-10-CM

## 2022-08-10 DIAGNOSIS — B351 Tinea unguium: Secondary | ICD-10-CM | POA: Diagnosis not present

## 2022-08-10 DIAGNOSIS — M79674 Pain in right toe(s): Secondary | ICD-10-CM | POA: Diagnosis not present

## 2022-08-10 DIAGNOSIS — M79675 Pain in left toe(s): Secondary | ICD-10-CM

## 2022-08-10 MED ORDER — MUPIROCIN 2 % EX OINT: 1.0000 | TOPICAL_OINTMENT | Freq: Two times a day (BID) | CUTANEOUS | 1 refills | Status: DC

## 2022-08-10 MED ORDER — DOXYCYCLINE HYCLATE 100 MG PO TABS
100.0000 mg | ORAL_TABLET | Freq: Two times a day (BID) | ORAL | 0 refills | Status: DC
Start: 2022-08-10 — End: 2022-09-22

## 2022-08-10 NOTE — Progress Notes (Signed)
Chief Complaint  Patient presents with   Nail Problem    "My toenails are going in my skin."    SUBJECTIVE Patient presents to office today complaining of elongated, thickened nails that cause pain while ambulating in shoes.  Patient is unable to trim their own nails. Patient is here for further evaluation and treatment.  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   Depression    Diabetes mellitus type 2, uncomplicated (HCC)    Diabetic retinopathy (HCC)    Dupuytren contracture 2011   s/p surgery to LEFT hand   Frequent urinary tract infections    GERD (gastroesophageal reflux disease)    Grade I diastolic dysfunction    HTN (hypertension)    Hyperlipidemia, unspecified    Irritable bowel syndrome    Meningitis 11/2019   MI (myocardial infarction) (HCC) 1994   no stent   MRSA (methicillin resistant staph aureus) culture positive    Neuromuscular disorder (HCC)    nerve pain in back and legs   Osteoporosis    Pneumonia 2021   RBBB    Sepsis (HCC) 11/2019   Skin cancer    TIA (transient ischemic attack) 2014   Vitamin D deficiency, unspecified    Wheezing     Allergies  Allergen Reactions   Chlorhexidine    Metformin And Related Diarrhea   Sulfa Antibiotics Rash     OBJECTIVE General Patient is awake, alert, and oriented x 3 and in no acute distress. Derm Skin is dry and supple bilateral. Negative open lesions or macerations. Remaining integument unremarkable. Nails are tender, long, thickened and dystrophic with subungual debris, consistent with onychomycosis, 1-5 bilateral.  The nail plate to the fourth digit of the left foot incurvated into the soft tissue of the distal tip of the left fourth toe.  After debridement  there is no open wound with slight serous drainage.  Mild localized erythema noted to the toe. Vasc  DP and PT pedal pulses palpable bilaterally. Temperature gradient within normal limits.  Neuro light touch and protective threshold sensation diminished bilaterally.  Musculoskeletal Exam No symptomatic pedal deformities noted bilateral. Muscular strength within normal limits.  ASSESSMENT 1.  Pain due to onychomycosis of toenails both 2.  Ulcer/laceration secondary to elongated nail plate fourth digit left foot with mild cellulitis 3.  Encounter for diabetic foot exam  PLAN OF CARE 1. Patient evaluated today.  Comprehensive diabetic foot exam performed today 2. Instructed to maintain good pedal hygiene and foot care.  3. Mechanical debridement of nails 1-5 bilaterally performed using a nail nipper. Filed with dremel without incident.  4.  Prescription for mupirocin 2% ointment apply 2 times daily to the toe with a Band-Aid  5.  Prescription for doxycycline 100 mg 2 times daily #20  6.  Return to clinic 3 weeks.  If the fourth toe is resolved she will get on our routine footcare regimen and return in 3 months   Felecia Shelling, DPM [facility]  Dr. Felecia Shelling, DPM    [address]  [place], [place] [address]                Office [phone]  Fax [phone]

## 2022-08-12 ENCOUNTER — Other Ambulatory Visit: Payer: Medicare Other

## 2022-08-17 ENCOUNTER — Telehealth: Payer: Self-pay

## 2022-08-17 NOTE — Progress Notes (Signed)
Care Management & Coordination Services Pharmacy Team Pharmacy Assistant   Name: Andrea [name]  MRN: [id number] DOB: 01/13/1946  Reason for Encounter: Medication Coordination and Delivery for [facility]  Contacted patient to discuss medications and coordinate delivery from [facility]. Spoke with patient on 08/17/2022   Chart review: Recent office visits:  None ID  Recent consult visits:  08/10/2022 Andrea [name], DPM (Podiatry) for nail Problem- Started: Doxycycline Hyclate 100 mg twice daily, Mupirocin 2% topical twice daily, No orders placed, patient to follow-up in 3 weeks.  Hospital visits:  None in previous 6 months  Medications: Outpatient Encounter Medications as of 08/17/2022  Medication Sig   Alcohol Swabs (ALCOHOL PADS) 70 % PADS 1 each by Does not apply route in the morning and at bedtime.   atorvastatin (LIPITOR) 20 MG tablet TAKE ONE TABLET BY MOUTH EVERYDAY AT BEDTIME   Calcium Carbonate-Vitamin D 600-400 MG-UNIT tablet Take 1 tablet by mouth daily.   clobetasol (TEMOVATE) 0.05 % external solution Apply 1 application. topically 2 (two) times daily.   Continuous Blood Gluc Sensor (FREESTYLE LIBRE 3 SENSOR) MISC Use to monitor blood sugars continuously as directed.   Cyanocobalamin (B-12) 1000 MCG CAPS Take 1,000 mcg by mouth daily.   dapagliflozin propanediol (FARXIGA) 10 MG TABS tablet Take 1 tablet (10 mg total) by mouth daily. TAKE ONE TABLET BY MOUTH ONCE DAILY BEFORE BREAKFAST   doxycycline (VIBRA-TABS) 100 MG tablet Take 1 tablet (100 mg total) by mouth 2 (two) times daily.   furosemide (LASIX) 20 MG tablet TAKE ONE TABLET BY MOUTH ONCE DAILY   hydrOXYzine (ATARAX) 25 MG tablet TAKE 1 TABLET BY MOUTH THREE TIMES A DAY AS NEEDED   insulin degludec (TRESIBA FLEXTOUCH) 100 UNIT/ML FlexTouch Pen Inject 28 Units into the skin daily. Patient receives via [employer] Patient Assistance through December 2024   Insulin Pen Needle (TRUEPLUS PEN NEEDLES) 31G X 6  MM MISC Use with lantus two times daily.   INSULIN SYRINGE 1CC/29G 29G X 1/2" 1 ML MISC For lantus injections twice daily   losartan (COZAAR) 25 MG tablet TAKE ONE TABLET BY MOUTH ONCE DAILY   metoprolol succinate (TOPROL-XL) 50 MG 24 hr tablet TAKE ONE TABLET BY MOUTH ONCE DAILY   montelukast (SINGULAIR) 10 MG tablet Take 1 tablet (10 mg total) by mouth at bedtime.   Multiple Vitamins-Minerals (ICAPS AREDS 2 PO) Take by mouth.   Multiple Vitamins-Minerals (PRESERVISION AREDS 2 PO) Take 1 tablet by mouth in the morning and at bedtime.   mupirocin ointment (BACTROBAN) 2 % Apply 1 Application topically 2 (two) times daily.   Omega-3 1000 MG CAPS Take 1,000 mg by mouth daily.   omeprazole (PRILOSEC) 40 MG capsule TAKE ONE CAPSULE BY MOUTH EVERY MORNING   OneTouch Delica Lancets 33G MISC To check blood sugar three times daily  DX: E11.42   ONETOUCH VERIO test strip Use to check blood sugar three times daily. DX E11.42   oxybutynin (DITROPAN-XL) 10 MG 24 hr tablet TAKE ONE TABLET BY MOUTH EVERYDAY AT BEDTIME   Semaglutide,0.25 or 0.5MG /DOS, (OZEMPIC, 0.25 OR 0.5 MG/DOSE,) 2 MG/3ML SOPN Inject 0.5 mg into the skin once a week. Patient receives via [employer] Patient Assistance through December 2024   sertraline (ZOLOFT) 25 MG tablet TAKE ONE TABLET BY MOUTH ONCE DAILY   Vitamin D, Ergocalciferol, (DRISDOL) 1.25 MG (50000 UNIT) CAPS capsule TAKE ONE CAPSULE BY MOUTH ONCE WEEKLY ON FRIDAY   No facility-administered encounter medications on file as of 08/17/2022.  BP Readings from Last 3 Encounters:  06/10/22 (!) 127/59  03/10/22 (!) 122/55  12/10/21 (!) 127/56    Pulse Readings from Last 3 Encounters:  06/10/22 92  03/10/22 92  12/10/21 91    Lab Results  Component Value Date/Time   HGBA1C 7.7 (H) 06/10/2022 11:20 AM   HGBA1C 7.6 (H) 03/10/2022 11:38 AM   HGBA1C 11.3 06/03/2021 04:44 PM   HGBA1C 7.5 (H) 11/26/2013 04:14 AM   Lab Results  Component Value Date   CREATININE 1.27 (H)  06/10/2022   BUN 32 (H) 06/10/2022   GFRNONAA 46 (L) 12/22/2020   GFRAA 70 02/11/2020   NA 143 06/10/2022   K 4.7 06/10/2022   CALCIUM 9.8 06/10/2022   CO2 22 06/10/2022   Cycle dispensing form sent to Andrea [name], CPP for review.   Last adherence delivery date:   07/30/2022  Patient is due for next adherence delivery on: 08/31/2022 2nd Route  This delivery to include: Adherence Packaging  30 Days  Atorvastatin 20 mg 1 tablet daily (Bedtime) Furosemide 20 mg 1 tablet daily (Breakfast) Losartan 25 mg 1 tablet daily (Breakfast) Metoprolol XL 50 mg 1 tablet daily (Breakfast) Omeprazole  40 mg 1 tablet daily (Breakfast) Oxybutynin XL 10 mg 1 tablet daily (Bedtime) Montelukast 10 mg 1 tablet daily (Bedtime) Vitamin D 50,000 U 1 tablet weekly (on Fridays) Sertraline 25 mg 1 tablet daily (Breakfast) Farxiga 10 mg 1 tablet daily (Before Breakfast) OneTouch Strips OneTouch Lancets  Patient declined the following medications this month: No medications were declined this month  Refills requested from providers include: Atorvastatin 20 mg this is a PCP medication and CPP can send over refill.  Confirmed delivery date of 08/31/2022 2nd Route, advised patient that pharmacy will contact them the morning of delivery.  Any concerns about your medications? No  How often do you forget or accidentally miss a dose? Never  Do you use a pillbox? No  Is patient in packaging Yes   Recent blood glucose readings are as follows: 86, 84, 84 this is the last 3 recent days  I spoke with the patient and she reports that she recently had an infection with one of her toes, but she is seeing Podiatry and she is healing well. Per patient she does have a follow-up in July. Patient stated that her blood sugars have been doing really well. Patient denies any ill symptoms at this time. Patient was reminded of her upcoming appointment with CPP and she stated she has it down on her Calendar. Patient has no  additional concerns or issues.   Patient has CPP follow-up on 08/23/2022 @ 1100.  Andrea [name], CPA/CMA Clinical Pharmacist Assistant Phone: [phone]

## 2022-08-24 ENCOUNTER — Ambulatory Visit
Admission: RE | Admit: 2022-08-24 | Discharge: 2022-08-24 | Disposition: A | Discharge status: Home / Self Care | Payer: Medicare Other | Source: Ambulatory Visit | Attending: Family Medicine | Admitting: Family Medicine

## 2022-08-24 DIAGNOSIS — Z1231 Encounter for screening mammogram for malignant neoplasm of breast: Secondary | ICD-10-CM | POA: Insufficient documentation

## 2022-08-26 NOTE — Progress Notes (Signed)
Hi [name]  Normal mammogram; repeat in 1 year.  Please let us know if you have any questions.  Thank you,  Sanae Willetts, FNP 

## 2022-08-30 ENCOUNTER — Ambulatory Visit: Payer: Self-pay

## 2022-08-30 NOTE — Patient Outreach (Signed)
  Care Coordination   Initial Visit Note   08/30/2022 Name: Andrea [name] MRN: [id number] DOB: [birth year]  Andrea [name] is a 77 y.o. year old female who sees Jacky Kindle, FNP for primary care. I spoke with  Alvis Lemmings by phone today.  What matters to the patients health and wellness today?  CM educated patient on [facility] services.  Patient declines services and feels that she is able to manage her medical needs.  Patient agreed to contact her provider in the future if she needs [facility] services.    Goals Addressed   None     SDOH assessments and interventions completed:  No     Care Coordination Interventions:  No, not indicated   Follow up plan: No further intervention required.   Encounter Outcome:  Pt. Refused

## 2022-08-31 ENCOUNTER — Ambulatory Visit: Payer: Medicare Other | Admitting: Podiatry

## 2022-09-07 ENCOUNTER — Ambulatory Visit (INDEPENDENT_AMBULATORY_CARE_PROVIDER_SITE_OTHER): Payer: Medicare Other | Admitting: Podiatry

## 2022-09-07 DIAGNOSIS — E119 Type 2 diabetes mellitus without complications: Secondary | ICD-10-CM | POA: Diagnosis not present

## 2022-09-07 NOTE — Progress Notes (Signed)
   Chief Complaint  Patient presents with   Nail Problem    "My toenails are going in my skin."    SUBJECTIVE Patient presents to office today for follow-up evaluation of a laceration to the left fourth toe secondary to an elongated toenail.  Patient doing well.  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   Depression    Diabetes mellitus type 2, uncomplicated (HCC)    Diabetic retinopathy (HCC)    Dupuytren contracture 2011   s/p surgery to LEFT hand   Frequent urinary tract infections    GERD (gastroesophageal reflux disease)    Grade I diastolic dysfunction    HTN (hypertension)    Hyperlipidemia, unspecified    Irritable bowel syndrome    Meningitis 11/2019   MI (myocardial infarction) (HCC) 1994   no stent   MRSA (methicillin resistant staph aureus) culture positive    Neuromuscular disorder (HCC)    nerve pain in back and legs   Osteoporosis    Pneumonia 2021   RBBB    Sepsis (HCC) 11/2019   Skin cancer    TIA (transient ischemic attack) 2014   Vitamin D deficiency, unspecified    Wheezing     Allergies  Allergen Reactions   Chlorhexidine    Metformin And Related Diarrhea   Sulfa Antibiotics Rash     OBJECTIVE General Patient is awake, alert, and oriented x 3 and in no acute distress. Derm no open wounds noted.  Erythema and edema to the toe has resolved completely Vasc  DP and PT pedal pulses palpable bilaterally. Temperature gradient within normal limits.  Neuro light touch and protective threshold sensation diminished bilaterally.  Musculoskeletal Exam No symptomatic pedal deformities noted bilateral. Muscular strength within normal limits.  ASSESSMENT 1.  Pain due to onychomycosis of  toenails both 2.  Ulcer/laceration secondary to elongated nail plate fourth digit left foot with mild cellulitis; healed 3.  Encounter for diabetic foot exam  PLAN OF CARE -Patient evaluated today.  Comprehensive diabetic foot exam performed today -The ulcer to the left fourth toe has healed and resolved. -Continue good supportive tennis shoes and sneakers -Return to clinic 3 months routine footcare   Felecia Shelling, DPM [facility]  Dr. Felecia Shelling, DPM    [address] [place], [place] [address]                Office [phone]  Fax [phone]

## 2022-09-08 ENCOUNTER — Ambulatory Visit: Payer: Medicare Other | Admitting: Family Medicine

## 2022-09-22 ENCOUNTER — Other Ambulatory Visit: Payer: Medicare Other | Admitting: Pharmacist

## 2022-09-22 NOTE — Patient Instructions (Signed)
Goals Addressed             This Visit's Progress    Pharmacy Goals       Our goal A1c is less than 7%. This corresponds with fasting sugars less than 130 and 2 hour after meal sugars less than 180. Please keep a log of your results when checking your blood sugar   Our goal bad cholesterol, or LDL, is less than 70. This is why it is important to continue taking your atorvastatin.  Check your blood pressure twice weekly, and any time you have concerning symptoms like headache, chest pain, dizziness, shortness of breath, or vision changes.   Our goal is less than 130/80.  To appropriately check your blood pressure, make sure you do the following:  1) Avoid caffeine, exercise, or tobacco products for 30 minutes before checking. Empty your bladder. 2) Sit with your back supported in a flat-backed chair. Rest your arm on something flat (arm of the chair, table, etc). 3) Sit still with your feet flat on the floor, resting, for at least 5 minutes.  4) Check your blood pressure. Take 1-2 readings.  5) Write down these readings and bring with you to any provider appointments.  Bring your home blood pressure machine with you to a provider's office for accuracy comparison at least once a year.   Make sure you take your blood pressure medications before you come to any office visit, even if you were asked to fast for labs.  Estelle Grumbles, PharmD, [facility] [phone]

## 2022-09-22 NOTE — Progress Notes (Signed)
09/22/2022 Name: Andrea [name] MRN: [id number] DOB: 1945-11-10  Chief Complaint  Patient presents with   Medication Management   Medication Assistance    Andrea [name] is a 77 y.o. year old female who presented for a telephone visit.   They were referred to the pharmacist by their PCP for assistance in managing diabetes and medication access.    Subjective:  Care Team: Primary Care Provider: Jacky Kindle, FNP ; Next Scheduled Visit: 09/23/2022 Cardiologist: Antonieta Iba, MD   Medication Access/Adherence  Current Pharmacy:  [facility] - [place], [place] - [address] [place] [address] Phone: [phone] Fax: [phone]   Patient reports affordability concerns with their medications: No  Patient reports access/transportation concerns to their pharmacy: No  Patient reports adherence concerns with their medications:  No    Patient uses weekly pillbox (transfers from pill pack to pillbox to incorporate)   Diabetes:  Current medications:  - Farxiga 10 mg daily each morning - Tresiba 28 units daily - Ozempic 0.5 mg weekly on Wednesdays  Medications tried in the past:   Current morning fasting readings: today: 106; recently ranging: 80s-130 Using One Touch Verio meter  Patient denies hypoglycemic s/sx including dizziness, shakiness, sweating.  - Reports carries glucose tablets with her  Statin therapy: atorvastatin 20 mg daily  Current medication access support: enrolled in patient assistance program for [place] and Ozempic from [employer] through 03/08/2023  Hypertension/Heart Failure:  Current medications:  ACEi/ARB/ARNI: Losartan 25 mg daily SGLT2i: Farxiga 10 mg daily each morning Beta blocker: Metoprolol ER 50 mg daily Diuretic regimen: Furosemide 20 mg daily   Medications previously tried: Jardiance (Yeast Infection)   Reports previously had a home upper arm home BP cuff,  but loaned monitor to her daughter Denies checking home BP recently  Patient denies hypotensive s/sx including dizziness, lightheadedness.   Current medication access support: none   Objective:   Lab Results  Component Value Date   HGBA1C 7.7 (H) 06/10/2022    Lab Results  Component Value Date   CREATININE 1.27 (H) 06/10/2022   BUN 32 (H) 06/10/2022   NA 143 06/10/2022   K 4.7 06/10/2022   CL 101 06/10/2022   CO2 22 06/10/2022    Lab Results  Component Value Date   CHOL 155 06/10/2022   HDL 68 06/10/2022   LDLCALC 61 06/10/2022   TRIG 157 (H) 06/10/2022   CHOLHDL 2.3 06/10/2022   BP Readings from Last 3 Encounters:  06/10/22 (!) 127/59  03/10/22 (!) 122/55  12/10/21 (!) 127/56   Pulse Readings from Last 3 Encounters:  06/10/22 92  03/10/22 92  12/10/21 91     Medications Reviewed Today     Reviewed by Manuela Neptune, RPH-CPP (Pharmacist) on 09/22/22 at 1117  Med List Status: <None>   Medication Order Taking? Sig Documenting Provider Last Dose Status Informant  Alcohol Swabs (ALCOHOL PADS) 70 % PADS [id number]  1 each by Does not apply route in the morning and at bedtime. Jacky Kindle, FNP  Active   atorvastatin (LIPITOR) 20 MG tablet [id number] Yes TAKE ONE TABLET BY MOUTH EVERYDAY AT BEDTIME Jacky Kindle, FNP Taking Active   Calcium Carbonate-Vitamin D 600-400 MG-UNIT tablet [id number] Yes Take 1 tablet by mouth daily. [provider] Taking Active Nursing Home Medication Administration Guide (MAG)  Patient not taking:  Discontinued 09/22/22 1117 (Patient Preference)   Cyanocobalamin (B-12) 1000 MCG CAPS [id number]  No Take 1,000 mcg by mouth daily.  Patient not taking: Reported on 09/22/2022   [provider] Not Taking Active Nursing Home Medication Administration Guide (MAG)  dapagliflozin propanediol (FARXIGA) 10 MG TABS tablet [id number] Yes Take 1 tablet (10 mg total) by mouth daily. TAKE ONE TABLET BY MOUTH ONCE DAILY BEFORE  BREAKFAST Jacky Kindle, FNP Taking Active   furosemide (LASIX) 20 MG tablet [id number] Yes TAKE ONE TABLET BY MOUTH ONCE DAILY Jacky Kindle, FNP Taking Active   hydrOXYzine (ATARAX) 25 MG tablet [id number] Yes TAKE 1 TABLET BY MOUTH THREE TIMES A DAY AS NEEDED Jacky Kindle, FNP Taking Active   insulin degludec (TRESIBA FLEXTOUCH) 100 UNIT/ML FlexTouch Pen [id number] Yes Inject 28 Units into the skin daily. Patient receives via [employer] Patient Assistance through December 2024 Jacky Kindle, FNP Taking Active   Insulin Pen Needle (TRUEPLUS PEN NEEDLES) 31G X 6 MM MISC [id number]  Use with lantus two times daily. Erasmo Downer, MD  Active Nursing Home Medication Administration Guide (MAG)  losartan (COZAAR) 25 MG tablet [id number] Yes TAKE ONE TABLET BY MOUTH ONCE DAILY Jacky Kindle, FNP Taking Active   metoprolol succinate (TOPROL-XL) 50 MG 24 hr tablet [id number] Yes TAKE ONE TABLET BY MOUTH ONCE DAILY Jacky Kindle, FNP Taking Active   montelukast (SINGULAIR) 10 MG tablet [id number] Yes Take 1 tablet (10 mg total) by mouth at bedtime. Jacky Kindle, FNP Taking Active   Multiple Vitamins-Minerals (PRESERVISION AREDS 2 PO) [id number] Yes Take 1 tablet by mouth in the morning and at bedtime. [provider] Taking Active Nursing Home Medication Administration Guide (MAG)  Omega-3 1000 MG CAPS [id number] Yes Take 1,000 mg by mouth daily. [provider] Taking Active Nursing Home Medication Administration Guide (MAG)  omeprazole (PRILOSEC) 40 MG capsule [id number] Yes TAKE ONE CAPSULE BY MOUTH EVERY MORNING Jacky Kindle, FNP Taking Active   OneTouch Delica Lancets 33G MISC [id number]  To check blood sugar three times daily  DX: E11.42 Jacky Kindle, FNP  Active   [facility] VERIO test strip [id number]  Use to check blood sugar three times daily. DX E11.42 [name] T, FNP  Active   oxybutynin (DITROPAN-XL) 10 MG 24 hr tablet [id number] Yes TAKE ONE TABLET BY MOUTH EVERYDAY AT  BEDTIME Jacky Kindle, FNP Taking Active   Semaglutide,0.25 or 0.5MG /DOS, (OZEMPIC, 0.25 OR 0.5 MG/DOSE,) 2 MG/3ML SOPN [id number] Yes Inject 0.5 mg into the skin once a week. Patient receives via [employer] Patient Assistance through December 2024 [name] T, FNP Taking Active   sertraline (ZOLOFT) 25 MG tablet [id number] Yes TAKE ONE TABLET BY MOUTH ONCE DAILY Jacky Kindle, FNP Taking Active   Vitamin D, Ergocalciferol, (DRISDOL) 1.25 MG (50000 UNIT) CAPS capsule [id number] Yes TAKE ONE CAPSULE BY MOUTH ONCE WEEKLY ON Lubertha South, FNP Taking Active   Med List Note Nelia Shi, CPhT 12/18/20 1047): [employer] [id number]              Assessment/Plan:   Comprehensive medication review performed; medication list updated in electronic medical record - Caution patient for risk of dizziness/sedation with hydroxyzine  Patient reports uses only as needed and lies down after taking - From review of chart, appears patient taking ergocalciferol 50,000 units weekly for >a year. Unable to find record of recent Vitamin D lab.  Will collaborate with PCP  Diabetes: - Currently uncontrolled - Reviewed long term cardiovascular and renal outcomes of uncontrolled blood  sugar - Reviewed goal A1c, goal fasting, and goal 2 hour post prandial glucose - Reviewed dietary modifications including importance of having regular well-balanced meals while controlling carbohydrate portion sizes - Discuss benefits of continuous glucose monitoring (CGM). Patient denies interest in starting CGM at this time. - Recommend to check glucose, keep log of results and have this record to review during future medical appointments - Review with patient financial criteria for Farxiga patient assistance program through [employer].   Patient is not sure if her income is within limit  Patient plans to check and follow up with Clinical Pharmacist if finds that she is eligible  Hypertension: - Encourage  patient to consider obtaining new home upper arm blood pressure monitor using health plan OTC benefit, restart checking home blood pressure and heart rate, keep log of results and have this record to review during future medical appointments   Follow Up Plan: Clinical Pharmacist will follow up with patient by telephone on 12/22/2022 at 10:30 AM   Estelle Grumbles, PharmD, [facility] [phone]

## 2022-09-23 ENCOUNTER — Ambulatory Visit: Payer: Medicare Other | Admitting: Family Medicine

## 2022-09-23 ENCOUNTER — Encounter: Payer: Self-pay | Admitting: Family Medicine

## 2022-09-23 VITALS — BP 125/52 | HR 99 | Wt 136.3 lb

## 2022-09-23 DIAGNOSIS — E1142 Type 2 diabetes mellitus with diabetic polyneuropathy: Secondary | ICD-10-CM

## 2022-09-23 DIAGNOSIS — M810 Age-related osteoporosis without current pathological fracture: Secondary | ICD-10-CM

## 2022-09-23 DIAGNOSIS — Z23 Encounter for immunization: Secondary | ICD-10-CM | POA: Diagnosis not present

## 2022-09-23 DIAGNOSIS — E559 Vitamin D deficiency, unspecified: Secondary | ICD-10-CM

## 2022-09-23 DIAGNOSIS — R54 Age-related physical debility: Secondary | ICD-10-CM | POA: Diagnosis not present

## 2022-09-23 DIAGNOSIS — R7989 Other specified abnormal findings of blood chemistry: Secondary | ICD-10-CM | POA: Diagnosis not present

## 2022-09-23 NOTE — Assessment & Plan Note (Signed)
Upcoming DEXA scan Continue to recommend walking or other weight bearing exercise Will check Vit D levels today given long term supplementation

## 2022-09-23 NOTE — Assessment & Plan Note (Signed)
Chronic; variable Continue to recommend exercise to assist mood and not the use of tobacco products given hx of MI as well as TIA Use of walker in office Use of w/c at home  Continue to work on strength promotion

## 2022-09-23 NOTE — Assessment & Plan Note (Signed)
Chronic; borderline Repeat A1c Continue to recommend balanced, lower carb meals. Smaller meal size, adding snacks. Choosing water as drink of choice and increasing purposeful exercise. Remains on  -farxiga 10 mg, tresiba 28 units, ozempic 0.5 mg

## 2022-09-23 NOTE — Progress Notes (Signed)
Established patient visit   Patient: Andrea [name]   DOB: [birth year]   77 y.o. Female  MRN: [id number] Visit Date: 09/23/2022  Today's healthcare provider: Jacky Kindle, FNP  Introduced to nurse practitioner role and practice setting.  All questions answered.  Discussed provider/patient relationship and expectations.  Presents with daughter  Chief Complaint  Patient presents with   Diabetes   Subjective     Medications: Outpatient Medications Prior to Visit  Medication Sig   Alcohol Swabs (ALCOHOL PADS) 70 % PADS 1 each by Does not apply route in the morning and at bedtime.   atorvastatin (LIPITOR) 20 MG tablet TAKE ONE TABLET BY MOUTH EVERYDAY AT BEDTIME   Calcium Carbonate-Vitamin D 600-400 MG-UNIT tablet Take 1 tablet by mouth daily.   dapagliflozin propanediol (FARXIGA) 10 MG TABS tablet Take 1 tablet (10 mg total) by mouth daily. TAKE ONE TABLET BY MOUTH ONCE DAILY BEFORE BREAKFAST   furosemide (LASIX) 20 MG tablet TAKE ONE TABLET BY MOUTH ONCE DAILY   hydrOXYzine (ATARAX) 25 MG tablet TAKE 1 TABLET BY MOUTH THREE TIMES A DAY AS NEEDED   insulin degludec (TRESIBA FLEXTOUCH) 100 UNIT/ML FlexTouch Pen Inject 28 Units into the skin daily. Patient receives via [employer] Patient Assistance through December 2024   Insulin Pen Needle (TRUEPLUS PEN NEEDLES) 31G X 6 MM MISC Use with lantus two times daily.   losartan (COZAAR) 25 MG tablet TAKE ONE TABLET BY MOUTH ONCE DAILY   metoprolol succinate (TOPROL-XL) 50 MG 24 hr tablet TAKE ONE TABLET BY MOUTH ONCE DAILY   montelukast (SINGULAIR) 10 MG tablet Take 1 tablet (10 mg total) by mouth at bedtime.   Multiple Vitamins-Minerals (PRESERVISION AREDS 2 PO) Take 1 tablet by mouth in the morning and at bedtime.   Omega-3 1000 MG CAPS Take 1,000 mg by mouth daily.   omeprazole (PRILOSEC) 40 MG capsule TAKE ONE CAPSULE BY MOUTH EVERY MORNING   OneTouch Delica Lancets 33G MISC To check blood sugar three times daily  DX: E11.42    ONETOUCH VERIO test strip Use to check blood sugar three times daily. DX E11.42   oxybutynin (DITROPAN-XL) 10 MG 24 hr tablet TAKE ONE TABLET BY MOUTH EVERYDAY AT BEDTIME   Semaglutide,0.25 or 0.5MG /DOS, (OZEMPIC, 0.25 OR 0.5 MG/DOSE,) 2 MG/3ML SOPN Inject 0.5 mg into the skin once a week. Patient receives via [employer] Patient Assistance through December 2024   sertraline (ZOLOFT) 25 MG tablet TAKE ONE TABLET BY MOUTH ONCE DAILY   Vitamin D, Ergocalciferol, (DRISDOL) 1.25 MG (50000 UNIT) CAPS capsule TAKE ONE CAPSULE BY MOUTH ONCE WEEKLY ON FRIDAY   Cyanocobalamin (B-12) 1000 MCG CAPS Take 1,000 mcg by mouth daily. (Patient not taking: Reported on 09/22/2022)   No facility-administered medications prior to visit.     Objective    BP (!) 125/52   Pulse 99   Wt 136 lb 4.8 oz (61.8 kg)   SpO2 95%   BMI 26.62 kg/m   Physical Exam Vitals and nursing note reviewed.  Constitutional:      General: She is not in acute distress.    Appearance: Normal appearance. She is overweight. She is not ill-appearing, toxic-appearing or diaphoretic.  HENT:     Head: Normocephalic and atraumatic.  Cardiovascular:     Rate and Rhythm: Normal rate and regular rhythm.     Pulses: Normal pulses.     Heart sounds: Normal heart sounds. No murmur heard.    No friction rub.  No gallop.  Pulmonary:     Effort: Pulmonary effort is normal. No respiratory distress.     Breath sounds: Normal breath sounds. No stridor. No wheezing, rhonchi or rales.  Chest:     Chest wall: No tenderness.  Abdominal:     General: Bowel sounds are normal.     Palpations: Abdomen is soft.  Musculoskeletal:        General: No swelling, tenderness, deformity or signs of injury. Normal range of motion.     Right lower leg: No edema.     Left lower leg: No edema.  Skin:    General: Skin is warm and dry.     Capillary Refill: Capillary refill takes less than 2 seconds.     Coloration: Skin is not jaundiced or pale.     Findings:  No bruising, erythema, lesion or rash.  Neurological:     General: No focal deficit present.     Mental Status: She is alert and oriented to person, place, and time. Mental status is at baseline.     Cranial Nerves: No cranial nerve deficit.     Sensory: No sensory deficit.     Motor: Weakness present.     Coordination: Coordination normal.     Comments: Poor LE strength s/s WC use and use of walker; needs to focus on 'get up and go' exercise to improve mobility and be less fearful of falls  Psychiatric:        Mood and Affect: Mood normal.        Behavior: Behavior normal.        Thought Content: Thought content normal.        Judgment: Judgment normal.     No results found for any visits on 09/23/22.  Assessment & Plan     Problem List Items Addressed This Visit       Endocrine   Type 2 diabetes mellitus with diabetic polyneuropathy, without long-term current use of insulin (HCC) - Primary    Chronic; borderline Repeat A1c Continue to recommend balanced, lower carb meals. Smaller meal size, adding snacks. Choosing water as drink of choice and increasing purposeful exercise. Remains on  -farxiga 10 mg, tresiba 28 units, ozempic 0.5 mg       Relevant Orders   Hemoglobin A1c     Musculoskeletal and Integument   OP (osteoporosis)    Upcoming DEXA scan Continue to recommend walking or other weight bearing exercise Will check Vit D levels today given long term supplementation         Other   Age-related physical debility    Chronic; variable Continue to recommend exercise to assist mood and not the use of tobacco products given hx of MI as well as TIA Use of walker in office Use of w/c at home  Continue to work on strength promotion       Avitaminosis D   Relevant Orders   Vitamin D (25 hydroxy)   Elevated serum creatinine   Relevant Orders   Basic Metabolic Panel (BMET)   Need for prophylactic vaccination against Streptococcus pneumoniae (pneumococcus)    Relevant Orders   Pneumococcal conjugate vaccine 20-valent (Prevnar 20) (Completed)   Return in about 3 months (around 12/24/2022) for chonic disease management.     Leilani Merl, FNP, have reviewed all documentation for this visit. The documentation on 09/23/22 for the exam, diagnosis, procedures, and orders are all accurate and complete.  Jacky Kindle, FNP  [facility]  Family Practice [phone] (phone) [phone] (fax)  [facility]

## 2022-09-24 ENCOUNTER — Other Ambulatory Visit: Payer: Self-pay | Admitting: Family Medicine

## 2022-09-24 DIAGNOSIS — E1142 Type 2 diabetes mellitus with diabetic polyneuropathy: Secondary | ICD-10-CM

## 2022-09-24 LAB — BASIC METABOLIC PANEL
BUN/Creatinine Ratio: 17 (ref 12–28)
BUN: 25 mg/dL (ref 8–27)
CO2: 22 mmol/L (ref 20–29)
Calcium: 10 mg/dL (ref 8.7–10.3)
Chloride: 100 mmol/L (ref 96–106)
Creatinine, Ser: 1.49 mg/dL — ABNORMAL HIGH (ref 0.57–1.00)
Glucose: 139 mg/dL — ABNORMAL HIGH (ref 70–99)
Potassium: 5.2 mmol/L (ref 3.5–5.2)
Sodium: 141 mmol/L (ref 134–144)
eGFR: 36 mL/min/{1.73_m2} — ABNORMAL LOW (ref >59)

## 2022-09-24 LAB — HEMOGLOBIN A1C
Est. average glucose Bld gHb Est-mCnc: 160 mg/dL
Hgb A1c MFr Bld: 7.2 % — ABNORMAL HIGH (ref 4.8–5.6)

## 2022-09-24 LAB — VITAMIN D 25 HYDROXY (VIT D DEFICIENCY, FRACTURES): Vit D, 25-Hydroxy: 82.1 ng/mL (ref 30.0–100.0)

## 2022-09-24 NOTE — Progress Notes (Signed)
A1c continues to improved; Continue to recommend balanced, lower carb meals. Smaller meal size, adding snacks. Choosing water as drink of choice and increasing purposeful exercise.  Slight uptick in creatinine; ensure hydration.   Vit D remains stable.

## 2022-09-24 NOTE — Telephone Encounter (Signed)
Requested Prescriptions  Pending Prescriptions Disp Refills   montelukast (SINGULAIR) 10 MG tablet [Pharmacy Med Name: montelukast 10 mg tablet] 90 tablet 3    Sig: TAKE ONE TABLET BY MOUTH EVERYDAY AT BEDTIME     Pulmonology:  Leukotriene Inhibitors Passed - 09/24/2022  5:22 PM      Passed - Valid encounter within last 12 months    Recent Outpatient Visits           Yesterday Type 2 diabetes mellitus with diabetic polyneuropathy, without long-term current use of insulin (HCC)   [place] [facility] [name] T, FNP   3 months ago Type 2 diabetes mellitus with diabetic polyneuropathy, without long-term current use of insulin 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 [facility])   [place] [facility] Jacky Kindle, FNP       Future Appointments             In 3 months Jacky Kindle, FNP [facility], [facility]             FARXIGA 10 MG TABS tablet [place] Med Name: Marcelline Deist 10 mg tablet] 90 tablet 1    Sig: TAKE ONE TABLET BY MOUTH BEFORE BREAKFAST     Endocrinology:  Diabetes - SGLT2 Inhibitors Failed - 09/24/2022  5:22 PM      Failed - Cr in normal range and within 360 days    Creat  Date Value Ref Range Status  12/16/2016 1.08 (H) 0.60 - 0.93 mg/dL Final    Comment:    For patients >33 years of age, the reference limit for Creatinine is approximately 13% higher for people identified as African-American. .    Creatinine, Ser  Date Value Ref Range Status  09/23/2022 1.49 (H) 0.57 - 1.00 mg/dL Final         Failed -  eGFR in normal range and within 360 days    GFR, Est African American  Date Value Ref Range Status  12/16/2016 60 > OR = 60 mL/min/1.66m2 Final   GFR calc Af Amer  Date Value Ref Range Status  02/11/2020 70 >59 mL/min/1.73 Final    Comment:    **In accordance with recommendations from the NKF-ASN Task force,**   [facility] is in the process of updating its eGFR calculation to the   2021 CKD-EPI creatinine equation that estimates kidney function   without a race variable.    GFR, Est Non African American  Date Value Ref Range Status  12/16/2016 52 (L) > OR = 60 mL/min/1.28m2 Final   GFR, Estimated  Date Value Ref Range Status  12/22/2020 46 (L) >60 mL/min Final    Comment:    (NOTE) Calculated using the CKD-EPI Creatinine Equation (2021)    eGFR  Date Value Ref Range Status  09/23/2022 36 (L) >59 mL/min/1.73 Final         Passed - HBA1C is between 0 and 7.9 and within 180 days    Hemoglobin A1C  Date Value Ref Range Status  06/03/2021 11.3  Final   Hgb  A1c MFr Bld  Date Value Ref Range Status  09/23/2022 7.2 (H) 4.8 - 5.6 % Final    Comment:             Prediabetes: 5.7 - 6.4          Diabetes: >6.4          Glycemic control for adults with diabetes: <7.0          Passed - Valid encounter within last 6 months    Recent Outpatient Visits           Yesterday Type 2 diabetes mellitus with diabetic polyneuropathy, without long-term current use of insulin [facility])   [place] [facility] [name] T, FNP   3 months ago Type 2 diabetes mellitus with diabetic polyneuropathy, without long-term current use of insulin [facility])   [place] [facility] [name] T, FNP   6 months ago Type 2 diabetes mellitus with diabetic polyneuropathy, without long-term current use of insulin [facility])   [place] [facility] [name] T, FNP   9 months ago Type 2 diabetes mellitus with hyperlipidemia [facility])   [place] [facility] [name] T, FNP   1 year ago 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 [facility])   [place] [facility] [place] [facility] Jacky Kindle, FNP       Future Appointments             In 3 months Jacky Kindle, FNP [facility], [facility]

## 2022-09-30 ENCOUNTER — Other Ambulatory Visit: Payer: Self-pay | Admitting: Family Medicine

## 2022-09-30 ENCOUNTER — Other Ambulatory Visit: Payer: Self-pay

## 2022-09-30 ENCOUNTER — Ambulatory Visit
Admission: RE | Admit: 2022-09-30 | Discharge: 2022-09-30 | Disposition: A | Discharge status: Home / Self Care | Payer: Medicare Other | Source: Ambulatory Visit | Attending: Family Medicine | Admitting: Family Medicine

## 2022-09-30 DIAGNOSIS — Z780 Asymptomatic menopausal state: Secondary | ICD-10-CM | POA: Diagnosis not present

## 2022-09-30 DIAGNOSIS — M810 Age-related osteoporosis without current pathological fracture: Secondary | ICD-10-CM | POA: Diagnosis not present

## 2022-09-30 MED ORDER — ALENDRONATE SODIUM 70 MG PO TABS: 70.0000 mg | ORAL_TABLET | ORAL | 11 refills | Status: DC

## 2022-09-30 NOTE — Progress Notes (Signed)
Osteoporosis. Very poor, weak bone strength noted. Can use Vit D 800 IU and Calcium 1200 mg to assist regular exercise for bone health. Can repeat DEXA in 2 years if desired.  Recommend starting Fosamax; [facility] [place].  Compared with prior study, there has been no significant change in the spine. Compared with prior study, there has been a significant decrease in the total hip.

## 2022-10-01 ENCOUNTER — Ambulatory Visit: Payer: Self-pay | Admitting: *Deleted

## 2022-10-01 NOTE — Telephone Encounter (Signed)
    Summary: Medication instructions   Pt is calling in because she would like instructions on how to take Vitamin D, Ergocalciferol, (DRISDOL) 1.25 MG (50000 UNIT) CAPS capsule [[id number]]. Pt says she is unsure how she is supposed to take the medication.           Chief Complaint: medication clarification  regarding how much vit D should be taken Symptoms: na Frequency: na Pertinent Negatives: Patient denies na Disposition: [] ED /[] Urgent Care (no appt availability in office) / [] Appointment(In office/virtual)/ []  [place] [facility]/ [] Home Care/ [] Refused Recommended Disposition /[] [place] [facility]/ [x]  Follow-up with PCP Additional Notes:   Please advise regarding vit D dose and how often to take . See current med list .     Reason for Disposition  [1] Caller has NON-URGENT medicine question about med that PCP prescribed AND [2] triager unable to answer question  Answer Assessment - Initial Assessment Questions 1. NAME of MEDICINE: "What medicine(s) are you calling about?"     Vit D Drisdol 50,000 units  every week and Calcium Carbonate-Vitamin D 600-400 MG-UNIT tablet daily  2. QUESTION: "What is your question?" (e.g., double dose of medicine, side effect)     How much vit D should be taken? 3. PRESCRIBER: "Who prescribed the medicine?" Reason: if prescribed by specialist, call should be referred to that group.     PCP/ historical  4. SYMPTOMS: "Do you have any symptoms?" If Yes, ask: "What symptoms are you having?"  "How bad are the symptoms (e.g., mild, moderate, severe)     No  5. PREGNANCY:  "Is there any chance that you are pregnant?" "When was your last menstrual period?"     na  Protocols used: Medication Question Call-A-AH

## 2022-10-04 ENCOUNTER — Telehealth: Payer: Self-pay | Admitting: Family Medicine

## 2022-10-04 NOTE — Telephone Encounter (Signed)
Pt states that she has been taking Vitamin D, Ergocalciferol, (DRISDOL) 1.25 MG (50000 UNIT) CAPS capsule.   Pt states that she is confused because she talked to her PCP and was told to take Vitamin D 800u daily. Pt is wanting to know if she need to take Vitamin D 800u daily or if she need to still keep taking Vitamin D, Ergocalciferol, (DRISDOL) 1.25 MG (50000 UNIT) CAPS capsule?  Please advise.

## 2022-10-05 ENCOUNTER — Other Ambulatory Visit: Payer: Self-pay

## 2022-10-05 DIAGNOSIS — E559 Vitamin D deficiency, unspecified: Secondary | ICD-10-CM

## 2022-10-05 NOTE — Telephone Encounter (Signed)
Patient advised. Verbalized understanding 

## 2022-10-05 NOTE — Telephone Encounter (Signed)
Andrea Kindle, FNP  6 hours ago (8:14 AM)    She can finish her current supply of one 50,000 IU once weekly; and then can start daily maintenance dose of 1000 IU

## 2022-10-12 ENCOUNTER — Telehealth: Payer: Self-pay

## 2022-10-13 NOTE — Patient Outreach (Signed)
  Care Management   Outreach Note   Name: Andrea [name] MRN: [id number] DOB: [birth year]  An unsuccessful telephone outreach was attempted today to contact the patient about Care Management needs.      Follow Up Plan:  A HIPAA compliant phone message was left for the patient providing contact information and requesting a return call.   [facility] Management [phone]

## 2022-10-17 ENCOUNTER — Other Ambulatory Visit: Payer: Self-pay | Admitting: Family Medicine

## 2022-10-17 DIAGNOSIS — N3281 Overactive bladder: Secondary | ICD-10-CM

## 2022-10-17 DIAGNOSIS — I5022 Chronic systolic (congestive) heart failure: Secondary | ICD-10-CM

## 2022-10-19 NOTE — Telephone Encounter (Signed)
Requested Prescriptions  Pending Prescriptions Disp Refills   furosemide (LASIX) 20 MG tablet [Pharmacy Med Name: furosemide 20 mg tablet] 90 tablet 0    Sig: TAKE ONE TABLET BY MOUTH ONCE DAILY     Cardiovascular:  Diuretics - Loop Failed - 10/17/2022  8:00 AM      Failed - Cr in normal range and within 180 days    Creat  Date Value Ref Range Status  12/16/2016 1.08 (H) 0.60 - 0.93 mg/dL Final    Comment:    For patients >77 years of age, the reference limit for Creatinine is approximately 13% higher for people identified as African-American. .    Creatinine, Ser  Date Value Ref Range Status  09/23/2022 1.49 (H) 0.57 - 1.00 mg/dL Final         Failed - Mg Level in normal range and within 180 days    Magnesium  Date Value Ref Range Status  12/21/2020 1.8 1.7 - 2.4 mg/dL Final    Comment:    Performed at [facility], [address] [place], [place] [address]         Passed - K in normal range and within 180 days    Potassium  Date Value Ref Range Status  09/23/2022 5.2 3.5 - 5.2 mmol/L Final  01/01/2014 4.2 3.5 - 5.1 mmol/L Final         Passed - Ca in normal range and within 180 days    Calcium  Date Value Ref Range Status  09/23/2022 10.0 8.7 - 10.3 mg/dL Final   Calcium, Total  Date Value Ref Range Status  01/01/2014 8.3 (L) 8.5 - 10.1 mg/dL Final         Passed - Na in normal range and within 180 days    Sodium  Date Value Ref Range Status  09/23/2022 141 134 - 144 mmol/L Final  01/01/2014 143 136 - 145 mmol/L Final         Passed - Cl in normal range and within 180 days    Chloride  Date Value Ref Range Status  09/23/2022 100 96 - 106 mmol/L Final  01/01/2014 105 98 - 107 mmol/L Final         Passed - Last BP in normal range    BP Readings from Last 1 Encounters:  09/23/22 (!) 125/52         Passed - Valid encounter within last 6 months    Recent Outpatient Visits           3 weeks ago Type 2 diabetes mellitus with diabetic  polyneuropathy, without long-term current use of insulin (HCC)   [place] [facility] [name] T, FNP   4 months ago Type 2 diabetes mellitus with diabetic polyneuropathy, without long-term current use of insulin [facility])   [place] [name] T, FNP   7 months ago Type 2 diabetes mellitus with diabetic polyneuropathy, without long-term current use of insulin [facility])   [place] [facility] [name] T, FNP   10 months ago Type 2 diabetes mellitus with hyperlipidemia [facility])   [place] [facility] [name] T, FNP   1 year ago 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 [facility])   [place] [facility] Jacky Kindle, FNP       Future Appointments             Tomorrow Jacky Kindle, FNP [place]  [facility], [facility]   In 2 months Suzie Portela, Daryl Eastern, FNP [facility], [facility]             oxybutynin (DITROPAN-XL) 10 MG 24 hr tablet [Pharmacy Med Name: oxybutynin chloride ER 10 mg tablet,extended release 24 hr] 90 tablet 0    Sig: TAKE ONE TABLET BY MOUTH EVERYDAY AT BEDTIME     Urology:  Bladder Agents Passed - 10/17/2022  8:00 AM      Passed - Valid encounter within last 12 months    Recent Outpatient Visits           3 weeks ago Type 2 diabetes mellitus with diabetic polyneuropathy, without long-term current use of insulin (HCC)   [place] [facility] [name] T, FNP   4 months ago Type 2 diabetes mellitus with diabetic polyneuropathy, without long-term current use of insulin [facility])   [place] [facility] [name] T, FNP   7 months ago Type 2 diabetes mellitus with diabetic polyneuropathy, without long-term current use of insulin [facility])   [place] [facility] [name] T, FNP   10 months ago Type 2 diabetes mellitus with hyperlipidemia [facility])   [place] [facility] [name] T, FNP   1 year ago 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

## 2022-10-20 ENCOUNTER — Encounter: Payer: Self-pay | Admitting: Family Medicine

## 2022-10-20 ENCOUNTER — Ambulatory Visit (INDEPENDENT_AMBULATORY_CARE_PROVIDER_SITE_OTHER): Payer: Medicare Other | Admitting: Family Medicine

## 2022-10-20 VITALS — BP 133/65 | HR 88 | Wt 138.0 lb

## 2022-10-20 DIAGNOSIS — S51012S Laceration without foreign body of left elbow, sequela: Secondary | ICD-10-CM

## 2022-10-20 DIAGNOSIS — S51012A Laceration without foreign body of left elbow, initial encounter: Secondary | ICD-10-CM

## 2022-10-20 DIAGNOSIS — S51012D Laceration without foreign body of left elbow, subsequent encounter: Secondary | ICD-10-CM | POA: Diagnosis not present

## 2022-10-20 DIAGNOSIS — M79642 Pain in left hand: Secondary | ICD-10-CM | POA: Insufficient documentation

## 2022-10-20 HISTORY — DX: Laceration without foreign body of left elbow, initial encounter: S51.012A

## 2022-10-20 MED ORDER — SILVER SULFADIAZINE 1 % EX CREA: 1.0000 | TOPICAL_CREAM | Freq: Every day | CUTANEOUS | 0 refills | Status: DC

## 2022-10-20 MED ORDER — ALENDRONATE SODIUM 70 MG PO TABS
70.0000 mg | ORAL_TABLET | ORAL | 11 refills | Status: DC
Start: 2022-10-20 — End: 2023-09-23

## 2022-10-20 MED ORDER — SILVER SULFADIAZINE 1 % EX CREA
1.0000 | TOPICAL_CREAM | Freq: Every day | CUTANEOUS | 0 refills | Status: DC
Start: 2022-10-20 — End: 2022-11-04

## 2022-10-20 NOTE — Progress Notes (Signed)
Established patient visit   Patient: Andrea [name]   DOB: 03-26-1945   77 y.o. Female  MRN: [id number] Visit Date: 10/20/2022  Today's healthcare provider: Jacky Kindle, FNP  Introduced to nurse practitioner role and practice setting.  All questions answered.  Discussed provider/patient relationship and expectations.  Subjective    Hand Pain    HPI     Hand Pain    Additional comments: Pain in left hand X few weeks. After hitting hand on the corner of the table      Last edited by Acey Lav, CMA on 10/20/2022 11:07 AM.      Medications: Outpatient Medications Prior to Visit  Medication Sig   Alcohol Swabs (ALCOHOL PADS) 70 % PADS 1 each by Does not apply route in the morning and at bedtime.   atorvastatin (LIPITOR) 20 MG tablet TAKE ONE TABLET BY MOUTH EVERYDAY AT BEDTIME   Calcium Carbonate-Vitamin D 600-400 MG-UNIT tablet Take 1 tablet by mouth daily.   Cyanocobalamin (B-12) 1000 MCG CAPS Take 1,000 mcg by mouth daily.   FARXIGA 10 MG TABS tablet TAKE ONE TABLET BY MOUTH BEFORE BREAKFAST   furosemide (LASIX) 20 MG tablet TAKE ONE TABLET BY MOUTH ONCE DAILY   hydrOXYzine (ATARAX) 25 MG tablet TAKE 1 TABLET BY MOUTH THREE TIMES A DAY AS NEEDED   insulin degludec (TRESIBA FLEXTOUCH) 100 UNIT/ML FlexTouch Pen Inject 28 Units into the skin daily. Patient receives via [employer] Patient Assistance through December 2024   Insulin Pen Needle (TRUEPLUS PEN NEEDLES) 31G X 6 MM MISC Use with lantus two times daily.   losartan (COZAAR) 25 MG tablet TAKE ONE TABLET BY MOUTH ONCE DAILY   metoprolol succinate (TOPROL-XL) 50 MG 24 hr tablet TAKE ONE TABLET BY MOUTH ONCE DAILY   montelukast (SINGULAIR) 10 MG tablet TAKE ONE TABLET BY MOUTH EVERYDAY AT BEDTIME   Multiple Vitamins-Minerals (PRESERVISION AREDS 2 PO) Take 1 tablet by mouth in the morning and at bedtime.   Omega-3 1000 MG CAPS Take 1,000 mg by mouth daily.   omeprazole (PRILOSEC) 40 MG capsule TAKE ONE CAPSULE BY  MOUTH EVERY MORNING   OneTouch Delica Lancets 33G MISC To check blood sugar three times daily  DX: E11.42   ONETOUCH VERIO test strip Use to check blood sugar three times daily. DX E11.42   oxybutynin (DITROPAN-XL) 10 MG 24 hr tablet TAKE ONE TABLET BY MOUTH EVERYDAY AT BEDTIME   Semaglutide,0.25 or 0.5MG /DOS, (OZEMPIC, 0.25 OR 0.5 MG/DOSE,) 2 MG/3ML SOPN Inject 0.5 mg into the skin once a week. Patient receives via [employer] Patient Assistance through December 2024   sertraline (ZOLOFT) 25 MG tablet TAKE ONE TABLET BY MOUTH ONCE DAILY   Vitamin D, Ergocalciferol, (DRISDOL) 1.25 MG (50000 UNIT) CAPS capsule TAKE ONE CAPSULE BY MOUTH ONCE WEEKLY ON FRIDAY   [DISCONTINUED] alendronate (FOSAMAX) 70 MG tablet Take 1 tablet (70 mg total) by mouth every 7 (seven) days. Take with a full glass of water on an empty stomach.   No facility-administered medications prior to visit.       Objective    BP 133/65 (BP Location: Right Arm, Patient Position: Sitting, Cuff Size: Small)   Pulse 88   Wt 138 lb (62.6 kg)   SpO2 100%   BMI 26.95 kg/m   Physical Exam Constitutional:      General: She is not in acute distress.    Appearance: Normal appearance. She is not ill-appearing, toxic-appearing or diaphoretic.  Cardiovascular:  Rate and Rhythm: Normal rate.  Pulmonary:     Effort: Pulmonary effort is normal.  Musculoskeletal:     Comments: Asthenia; use of walker  Skin:    General: Skin is warm and dry.     Findings: Erythema and lesion present.       Neurological:     General: No focal deficit present.     Mental Status: She is alert and oriented to person, place, and time. Mental status is at baseline.     Motor: Weakness present.     Gait: Gait abnormal.  Psychiatric:        Mood and Affect: Mood normal.        Behavior: Behavior normal.        Thought Content: Thought content normal.        Judgment: Judgment normal.      No results found for any visits on 10/20/22.   Assessment & Plan     Problem List Items Addressed This Visit       Musculoskeletal and Integument   Skin tear of left elbow without complication - Primary    Was in her w/c and hit her L dorsal hand on a bedside table Site remains red, pink; however, daughter reports poor healing Trial of topical cream and continued monitoring Pt declines xray; low risk of infection      Return if symptoms worsen or fail to improve.     Leilani Merl, FNP, have reviewed all documentation for this visit. The documentation on 10/20/22 for the exam, diagnosis, procedures, and orders are all accurate and complete.  Jacky Kindle, FNP  [facility] [phone] (phone) [phone] (fax)  [facility]

## 2022-10-20 NOTE — Assessment & Plan Note (Signed)
Was in her w/c and hit her L dorsal hand on a bedside table Site remains red, pink; however, daughter reports poor healing Trial of topical cream and continued monitoring Pt declines xray; low risk of infection

## 2022-10-28 ENCOUNTER — Telehealth: Payer: Self-pay | Admitting: Family Medicine

## 2022-10-28 NOTE — Telephone Encounter (Signed)
Pt is calling in because she is having issues with obtaining her medication Semaglutide,0.25 or 0.5MG /DOS, (OZEMPIC, 0.25 OR 0.5 MG/DOSE,) 2 MG/3ML SOPN [[id number]] , insulin degludec (TRESIBA FLEXTOUCH) 100 UNIT/ML FlexTouch Pen [[id number]] . Pt says she was going through the [employer] financial program. Pt says she received a message saying her request was unable to be processed. Pt wants to know what that means. Pt is requesting a callback from the office regarding this.

## 2022-11-02 ENCOUNTER — Other Ambulatory Visit: Payer: Self-pay | Admitting: Family Medicine

## 2022-11-02 ENCOUNTER — Telehealth: Payer: Self-pay

## 2022-11-02 DIAGNOSIS — K219 Gastro-esophageal reflux disease without esophagitis: Secondary | ICD-10-CM

## 2022-11-02 DIAGNOSIS — E559 Vitamin D deficiency, unspecified: Secondary | ICD-10-CM

## 2022-11-02 DIAGNOSIS — S51012S Laceration without foreign body of left elbow, sequela: Secondary | ICD-10-CM

## 2022-11-02 NOTE — Telephone Encounter (Signed)
PAP: PAP application for Ozempic, Publishing rights manager ([employer]) and [employer]) has been mailed to [employer] home address on file. Will fax provider portion of application to provider's office when pt's portion is received.    PLEASED BE ADVISED I TRIED TO E-FILE BUT PROVIDER WAS NOT IN SYSTEM.

## 2022-11-03 ENCOUNTER — Other Ambulatory Visit: Payer: Self-pay | Admitting: Family Medicine

## 2022-11-03 NOTE — Telephone Encounter (Signed)
FAX TO PROVIDER  FOR OZEMPIC to [employer] TO RENEW EXPIRED SCRIPT.   PLEASE BE ADVISED I DID CALL PT AND LET HER KNOW TO DISCARD THAT APP THAT I MAILED TO HER.

## 2022-11-03 NOTE — Telephone Encounter (Signed)
Patient needing new order for Ozempic to be sent to [employer] PAP. Would you please complete the reorder form, which can be found at the following web address below, sign and then fax this to [employer] at the fax number on the form ([phone])?  [email or web address]  Thank you!  Estelle Grumbles, PharmD, [facility] [phone]

## 2022-11-04 NOTE — Telephone Encounter (Signed)
Requested medication (s) are due for refill today: Yes  Requested medication (s) are on the active medication list: Yes  Last refill:    Future visit scheduled: Yes  Notes to clinic:  Medications require manual review.    Requested Prescriptions  Pending Prescriptions Disp Refills   Vitamin D, Ergocalciferol, (DRISDOL) 1.25 MG (50000 UNIT) CAPS capsule [Pharmacy Med Name: VIT D-2 1.25MG  SGC(50,000U) 1.25 MG Capsule] 12 capsule 10    Sig: TAKE 1 CAPSULE BY MOUTH ONCE WEEKLY ON FRIDAY *REFILL REQUEST*     Endocrinology:  Vitamins - Vitamin D Supplementation 2 Failed - 11/02/2022  5:17 PM      Failed - Manual Review: Route requests for 50,000 IU strength to the provider      Passed - Ca in normal range and within 360 days    Calcium  Date Value Ref Range Status  09/23/2022 10.0 8.7 - 10.3 mg/dL Final   Calcium, Total  Date Value Ref Range Status  01/01/2014 8.3 (L) 8.5 - 10.1 mg/dL Final         Passed - Vitamin D in normal range and within 360 days    Vit D, 25-Hydroxy  Date Value Ref Range Status  09/23/2022 82.1 30.0 - 100.0 ng/mL Final    Comment:    Vitamin D deficiency has been defined by the Institute of Medicine and an Endocrine Society practice guideline as a level of serum 25-OH vitamin D less than 20 ng/mL (1,2). The Endocrine Society went on to further define vitamin D insufficiency as a level between 21 and 29 ng/mL (2). 1. IOM (Institute of Medicine). 2010. Dietary reference    intakes for calcium and D. [place]: The    [employer]. 2. Holick MF, [place], Bischoff-Ferrari HA, et al.    Evaluation, treatment, and prevention of vitamin D    deficiency: an Endocrine Society clinical practice    guideline. JCEM. 2011 Jul; 96(7):1911-30.          Passed - Valid encounter within last 12 months    Recent Outpatient Visits           2 weeks ago Skin tear of left elbow without complication, sequela   [place] [facility]  [name] T, FNP   1 month ago Type 2 diabetes mellitus with diabetic polyneuropathy, without long-term current use of insulin [facility])   [place] [facility] [name] T, FNP   4 months ago Type 2 diabetes mellitus with diabetic polyneuropathy, without long-term current use of insulin [facility])   [place] [name] T, FNP   7 months ago Type 2 diabetes mellitus with diabetic polyneuropathy, without long-term current use of insulin [facility])   [facility] [name] T, FNP   10 months ago Type 2 diabetes mellitus with hyperlipidemia [facility])   [place] [facility] Jacky Kindle, FNP       Future Appointments             In 1 month Jacky Kindle, FNP [facility], [facility]             silver sulfADIAZINE (SILVADENE) 1 % cream [Pharmacy Med Name: SILVER SULF 1% CRM 50GM 1 % Cream] 50 g 10    Sig: APPLY 1 APPLICATION TOPICALLY DAILY AS DIRECTED *REFILL REQUEST*     Off-Protocol Failed - 11/02/2022  5:17 PM      Failed - Medication  not assigned to a protocol, review manually.      Passed - Valid encounter within last 12 months    Recent Outpatient Visits           2 weeks ago Skin tear of left elbow without complication, sequela   [place] [facility] [name] T, FNP   1 month ago Type 2 diabetes mellitus with diabetic polyneuropathy, without long-term current use of insulin [facility])   [name], FNP   4 months ago Type 2 diabetes mellitus with diabetic polyneuropathy, without long-term current use of insulin [facility])   [place] [facility] [name] T, FNP   7 months ago Type 2 diabetes mellitus with diabetic polyneuropathy, without long-term current use of insulin [facility])   [place] [facility] [name] T, FNP   10 months ago Type 2 diabetes mellitus with  hyperlipidemia [facility])   [place] [facility] Jacky Kindle, FNP       Future Appointments             In 1 month Jacky Kindle, FNP [facility], [facility]            Signed Prescriptions Disp Refills   omeprazole (PRILOSEC) 40 MG capsule 90 capsule 1    Sig: TAKE 1 CAPSULE BY MOUTH EVERY MORNING *REFILL REQUEST*     Gastroenterology: Proton Pump Inhibitors Passed - 11/02/2022  5:17 PM      Passed - Valid encounter within last 12 months    Recent Outpatient Visits           2 weeks ago Skin tear of left elbow without complication, sequela   [name] T, FNP   1 month ago Type 2 diabetes mellitus with diabetic polyneuropathy, without long-term current use of insulin [facility])   [place] [facility] [name] T, FNP   4 months ago Type 2 diabetes mellitus with diabetic polyneuropathy, without long-term current use of insulin [facility])   [place] [facility] [name] T, FNP   7 months ago Type 2 diabetes mellitus with diabetic polyneuropathy, without long-term current use of insulin [facility])   [place] [facility] [name] T, FNP   10 months ago Type 2 diabetes mellitus with hyperlipidemia [facility])    [facility] Jacky Kindle, FNP       Future Appointments             In 1 month Suzie Portela, Daryl Eastern, FNP [facility]

## 2022-11-04 NOTE — Telephone Encounter (Signed)
Requested Prescriptions  Pending Prescriptions Disp Refills   omeprazole (PRILOSEC) 40 MG capsule [Pharmacy Med Name: OMEPRAZOLE DR 40 MG CAPSULE 40 Capsule] 90 capsule 1    Sig: TAKE 1 CAPSULE BY MOUTH EVERY MORNING *REFILL REQUEST*     Gastroenterology: Proton Pump Inhibitors Passed - 11/02/2022  5:17 PM      Passed - Valid encounter within last 12 months    Recent Outpatient Visits           2 weeks ago Skin tear of left elbow without complication, sequela   [place] [facility] [name] T, FNP   1 month ago Type 2 diabetes mellitus with diabetic polyneuropathy, without long-term current use of insulin (HCC)   [place] [name] T, FNP   4 months ago Type 2 diabetes mellitus with diabetic polyneuropathy, without long-term current use of insulin (HCC)   [place] [facility] [name] T, FNP   7 months ago Type 2 diabetes mellitus with diabetic polyneuropathy, without long-term current use of insulin (HCC)   [place] [facility] [name] T, FNP   10 months ago Type 2 diabetes mellitus with hyperlipidemia [facility])   [place] [facility] [name] T, FNP       Future Appointments             In 1 month Jacky Kindle, FNP  [place], [facility]             Vitamin D, Ergocalciferol, (DRISDOL) 1.25 MG (50000 UNIT) CAPS capsule [Pharmacy Med Name: VIT D-2 1.25MG  SGC(50,000U) 1.25 MG Capsule] 12 capsule 10    Sig: TAKE 1 CAPSULE BY MOUTH ONCE WEEKLY ON FRIDAY *REFILL REQUEST*     Endocrinology:  Vitamins - Vitamin D Supplementation 2 Failed - 11/02/2022  5:17 PM      Failed - Manual Review: Route requests for 50,000 IU strength to the provider      Passed - Ca in normal range and within 360 days    Calcium  Date Value Ref Range Status  09/23/2022 10.0 8.7 - 10.3 mg/dL Final   Calcium, Total  Date Value Ref Range Status  01/01/2014 8.3 (L)  8.5 - 10.1 mg/dL Final         Passed - Vitamin D in normal range and within 360 days    Vit D, 25-Hydroxy  Date Value Ref Range Status  09/23/2022 82.1 30.0 - 100.0 ng/mL Final    Comment:    Vitamin D deficiency has been defined by the Institute of Medicine and an Endocrine Society practice guideline as a level of serum 25-OH vitamin D less than 20 ng/mL (1,2). The Endocrine Society went on to further define vitamin D insufficiency as a level between 21 and 29 ng/mL (2). 1. IOM (Institute of Medicine). 2010. Dietary reference    intakes for calcium and D. [place]: The    [employer]. 2. Holick MF, Binkley [place], Bischoff-Ferrari HA, et al.    Evaluation, treatment, and prevention of vitamin D    deficiency: an Endocrine Society clinical practice    guideline. JCEM. 2011 Jul; 96(7):1911-30.          Passed - Valid encounter within last 12 months    Recent Outpatient Visits           2 weeks ago Skin tear of left elbow without complication, sequela   [facility] [name]  T, FNP   1 month ago Type 2 diabetes mellitus with diabetic polyneuropathy, without long-term current use of insulin (HCC)   [place] [facility] [name] T, FNP   4 months ago Type 2 diabetes mellitus with diabetic polyneuropathy, without long-term current use of insulin [facility])   [place] [facility] [name] T, FNP   7 months ago Type 2 diabetes mellitus with diabetic polyneuropathy, without long-term current use of insulin [facility])   [place] [facility] [name] T, FNP   10 months ago Type 2 diabetes mellitus with hyperlipidemia [facility])   [place] [facility] Jacky Kindle, FNP       Future Appointments             In 1 month Jacky Kindle, FNP [place] [facility], [facility]             silver sulfADIAZINE (SILVADENE) 1 % cream [Pharmacy Med Name: SILVER SULF  1% CRM 50GM 1 % Cream] 50 g 10    Sig: APPLY 1 APPLICATION TOPICALLY DAILY AS DIRECTED *REFILL REQUEST*     Off-Protocol Failed - 11/02/2022  5:17 PM      Failed - Medication not assigned to a protocol, review manually.      Passed - Valid encounter within last 12 months    Recent Outpatient Visits           2 weeks ago Skin tear of left elbow without complication, sequela   [place] [facility] [name] T, FNP   1 month ago Type 2 diabetes mellitus with diabetic polyneuropathy, without long-term current use of insulin [facility])   [place] [facility] [name] T, FNP   4 months ago Type 2 diabetes mellitus with diabetic polyneuropathy, without long-term current use of insulin [facility])   [place] [facility] [name] T, FNP   7 months ago Type 2 diabetes mellitus with diabetic polyneuropathy, without long-term current use of insulin [facility])   [place] [facility] [name] T, FNP   10 months ago Type 2 diabetes mellitus with hyperlipidemia [facility])   [place] [facility] Jacky Kindle, FNP       Future Appointments             In 1 month Jacky Kindle, FNP [facility]

## 2022-11-11 ENCOUNTER — Other Ambulatory Visit: Payer: Self-pay | Admitting: Family Medicine

## 2022-11-12 ENCOUNTER — Ambulatory Visit: Payer: Self-pay

## 2022-11-12 NOTE — Telephone Encounter (Signed)
Left detailed voicemail per DPR. CRM created. Ok for [facility] to advise if patient returns call

## 2022-11-12 NOTE — Telephone Encounter (Signed)
  Chief Complaint: OTC pill too big Symptoms: nearly stuck in throat Frequency:  Pertinent Negatives: Patient denies  Disposition: [] ED /[] Urgent Care (no appt availability in office) / [] Appointment(In office/virtual)/ []  [place] [facility]/ [] Home Care/ [] Refused Recommended Disposition /[] [place] [facility]/ [x]  Follow-up with PCP Additional Notes: Pt has recently started a calcium/ vit D otc medication. Pt states that the pill is very large and she is having difficulty swallowing it. Pt would like a smaller pill or perhaps pt can take 2 pills. Please advise.  Summary: rx concern   The patient would like to speak with a member of clinical staff regarding concerns for their Calcium Carbonate-Vitamin D 600-400 MG-UNIT tablet [[id number]]  The patient shares that the size of the pill has become a concern and they are experiencing difficulty swallowing the tablet  Please contact the patient further when possible     Reason for Disposition  [1] Caller has NON-URGENT medicine question about med that PCP prescribed AND [2] triager unable to answer question  Answer Assessment - Initial Assessment Questions 1. NAME of MEDICINE: "What medicine(s) are you calling about?"     Calcium Carbonate - vitamin D 600-400 2. QUESTION: "What is your question?" (e.g., double dose of medicine, side effect)     Pill is too big 3. PRESCRIBER: "Who prescribed the medicine?" Reason: if prescribed by specialist, call should be referred to that group.     [name] 4. SYMPTOMS: "Do you have any symptoms?" If Yes, ask: "What symptoms are you having?"  "How bad are the symptoms (e.g., mild, moderate, severe)     Nearly choked on pill  Protocols used: Medication Question Call-A-AH

## 2022-11-15 NOTE — Telephone Encounter (Signed)
PAP: Application for Ozempic has been submitted to P[employer] Companies: [employer], via fax   PLEASE BE ADVISED

## 2022-11-16 NOTE — Telephone Encounter (Signed)
Patient was made aware of providers recommendation.

## 2022-11-22 ENCOUNTER — Other Ambulatory Visit: Payer: Self-pay | Admitting: Family Medicine

## 2022-11-22 DIAGNOSIS — I5022 Chronic systolic (congestive) heart failure: Secondary | ICD-10-CM

## 2022-11-22 DIAGNOSIS — F419 Anxiety disorder, unspecified: Secondary | ICD-10-CM

## 2022-12-07 ENCOUNTER — Ambulatory Visit (INDEPENDENT_AMBULATORY_CARE_PROVIDER_SITE_OTHER): Payer: Medicare Other | Admitting: Podiatry

## 2022-12-07 ENCOUNTER — Encounter: Payer: Self-pay | Admitting: Podiatry

## 2022-12-07 VITALS — BP 129/54 | HR 85

## 2022-12-07 DIAGNOSIS — M79674 Pain in right toe(s): Secondary | ICD-10-CM | POA: Diagnosis not present

## 2022-12-07 DIAGNOSIS — B351 Tinea unguium: Secondary | ICD-10-CM | POA: Diagnosis not present

## 2022-12-07 DIAGNOSIS — M79675 Pain in left toe(s): Secondary | ICD-10-CM | POA: Diagnosis not present

## 2022-12-07 NOTE — Progress Notes (Signed)
   Chief Complaint  Patient presents with   Diabetes    "Cutting my nails."    SUBJECTIVE Patient with a history of diabetes mellitus presents to office today complaining of elongated, thickened nails that cause pain while ambulating in shoes.  Patient is unable to trim their own nails. Patient is here for further evaluation and treatment.  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   Depression    Diabetes mellitus type 2, uncomplicated (HCC)    Diabetic retinopathy (HCC)    Dupuytren contracture 2011   s/p surgery to LEFT hand   Frequent urinary tract infections    GERD (gastroesophageal reflux disease)    Grade I diastolic dysfunction    HTN (hypertension)    Hyperlipidemia, unspecified    Irritable bowel syndrome    Meningitis 11/2019   MI (myocardial infarction) (HCC) 1994   no stent   MRSA (methicillin resistant staph aureus) culture positive    Neuromuscular disorder (HCC)    nerve pain in back and legs   Osteoporosis    Pneumonia 2021   RBBB    Sepsis (HCC) 11/2019   Skin cancer    TIA (transient ischemic attack) 2014   Vitamin D deficiency, unspecified    Wheezing     Allergies  Allergen Reactions   Chlorhexidine    Metformin And Related Diarrhea   Sulfa Antibiotics Rash     OBJECTIVE General Patient is awake, alert, and oriented x 3 and in no acute distress. Derm Skin is dry and supple bilateral. Negative open lesions or macerations. Remaining integument unremarkable. Nails are tender, long, thickened and dystrophic with subungual debris, consistent with onychomycosis, 1-5 bilateral. No signs of infection noted. Vasc  DP and PT pedal pulses palpable bilaterally. Temperature gradient within normal limits.  Neuro  Epicritic and protective threshold sensation diminished bilaterally.  Musculoskeletal Exam No symptomatic pedal deformities noted bilateral. Muscular strength within normal limits.  ASSESSMENT 1. Diabetes Mellitus w/ peripheral neuropathy 2.  Pain due to onychomycosis of toenails bilateral  PLAN OF CARE 1. Patient evaluated today. 2. Instructed to maintain good pedal hygiene and foot care. Stressed importance of controlling blood sugar.  3. Mechanical debridement of nails 1-5 bilaterally performed using a nail nipper. Filed with dremel without incident.  4. Return to clinic in 3 mos.     Felecia Shelling, DPM [facility]  Dr. Felecia Shelling, DPM    [address] [place], [place] [address]                Office [phone]  Fax [phone]

## 2022-12-12 ENCOUNTER — Other Ambulatory Visit: Payer: Self-pay | Admitting: Family Medicine

## 2022-12-12 DIAGNOSIS — I5022 Chronic systolic (congestive) heart failure: Secondary | ICD-10-CM

## 2022-12-12 DIAGNOSIS — N3281 Overactive bladder: Secondary | ICD-10-CM

## 2022-12-13 ENCOUNTER — Other Ambulatory Visit: Payer: Self-pay | Admitting: Family Medicine

## 2022-12-13 ENCOUNTER — Other Ambulatory Visit: Payer: Self-pay

## 2022-12-13 DIAGNOSIS — I5022 Chronic systolic (congestive) heart failure: Secondary | ICD-10-CM

## 2022-12-13 DIAGNOSIS — N3281 Overactive bladder: Secondary | ICD-10-CM

## 2022-12-13 NOTE — Telephone Encounter (Signed)
Requested Prescriptions  Pending Prescriptions Disp Refills   furosemide (LASIX) 20 MG tablet [Pharmacy Med Name: FUROSEMIDE 20 MG TABS 20 Tablet] 90 tablet 1    Sig: TAKE 1 TABLET BY MOUTH ONCE DAILY     Cardiovascular:  Diuretics - Loop Failed - 12/12/2022  8:06 PM      Failed - Cr in normal range and within 180 days    Creat  Date Value Ref Range Status  12/16/2016 1.08 (H) 0.60 - 0.93 mg/dL Final    Comment:    For patients >77 years of age, the reference limit for Creatinine is approximately 13% higher for people identified as African-American. .    Creatinine, Ser  Date Value Ref Range Status  09/23/2022 1.49 (H) 0.57 - 1.00 mg/dL Final         Failed - Mg Level in normal range and within 180 days    Magnesium  Date Value Ref Range Status  12/21/2020 1.8 1.7 - 2.4 mg/dL Final    Comment:    Performed at [facility], [address] [place], [place] [address]         Passed - K in normal range and within 180 days    Potassium  Date Value Ref Range Status  09/23/2022 5.2 3.5 - 5.2 mmol/L Final  01/01/2014 4.2 3.5 - 5.1 mmol/L Final         Passed - Ca in normal range and within 180 days    Calcium  Date Value Ref Range Status  09/23/2022 10.0 8.7 - 10.3 mg/dL Final   Calcium, Total  Date Value Ref Range Status  01/01/2014 8.3 (L) 8.5 - 10.1 mg/dL Final         Passed - Na in normal range and within 180 days    Sodium  Date Value Ref Range Status  09/23/2022 141 134 - 144 mmol/L Final  01/01/2014 143 136 - 145 mmol/L Final         Passed - Cl in normal range and within 180 days    Chloride  Date Value Ref Range Status  09/23/2022 100 96 - 106 mmol/L Final  01/01/2014 105 98 - 107 mmol/L Final         Passed - Last BP in normal range    BP Readings from Last 1 Encounters:  12/07/22 (!) 129/54         Passed - Valid encounter within last 6 months    Recent Outpatient Visits           1 month ago Skin tear of left elbow without  complication, sequela   [facility] [name] T, FNP   2 months ago Type 2 diabetes mellitus with diabetic polyneuropathy, without long-term current use of insulin [facility])   [facility] [name] T, FNP   6 months ago Type 2 diabetes mellitus with diabetic polyneuropathy, without long-term current use of insulin [facility])   [place] [facility] [name] T, FNP   9 months ago Type 2 diabetes mellitus with diabetic polyneuropathy, without long-term current use of insulin [facility])   [facility] [name] T, FNP   1 year ago Type 2 diabetes mellitus with hyperlipidemia [facility])   [facility] Jacky Kindle, FNP       Future Appointments             In 1 week Jacky Kindle, FNP  [place] [facility]             oxybutynin (DITROPAN-XL) 10 MG 24 hr tablet [Pharmacy Med Name: OXYBUTYNIN ER 10MG  TAB 10 Tablet] 90 tablet 1    Sig: TAKE 1 TABLET BY MOUTH EVERY DAY AT BEDTIME     Urology:  Bladder Agents Passed - 12/12/2022  8:06 PM      Passed - Valid encounter within last 12 months    Recent Outpatient Visits           1 month ago Skin tear of left elbow without complication, sequela   [facility] [name] T, FNP   2 months ago Type 2 diabetes mellitus with diabetic polyneuropathy, without long-term current use of insulin [facility])   [place] [facility] [name] T, FNP   6 months ago Type 2 diabetes mellitus with diabetic polyneuropathy, without long-term current use of insulin [facility])   [place] [facility] [name] T, FNP   9 months ago Type 2 diabetes mellitus with diabetic polyneuropathy, without long-term current use of insulin [facility])   [facility] [name] T, FNP   1 year ago Type 2 diabetes mellitus with hyperlipidemia [facility])   [facility] Jacky Kindle, FNP       Future Appointments             In 1 week Jacky Kindle, FNP [facility], [facility]

## 2022-12-13 NOTE — Patient Outreach (Signed)
Care Management   Visit Note  12/13/2022 Name: ALLICIA RUPPENTHAL MRN: [id number] DOB: 03-02-1946  Subjective: Andrea [name] is a 77 y.o. year old female who is a primary care patient of Jacky Kindle, FNP. The Care Management team was consulted for assistance.      Engaged with patient by telephone today. Requested to complete Care Management outreach on 12/14/22. Denies urgent concerns. Agreed to call if assistance is required prior to scheduled outreach.    PLAN Will follow up on 12/14/22.   Katina Degree Health  [facility], [facility] RN Care Manager Direct Dial: [phone] Website: [email or web address]

## 2022-12-14 ENCOUNTER — Other Ambulatory Visit: Payer: Medicare Other

## 2022-12-14 NOTE — Patient Outreach (Signed)
Care Management   Visit Note  12/14/2022 Name: Andrea [name] MRN: [id number] DOB: [birth year]  Subjective: Andrea [name] is a 77 y.o. year old female who is a primary care patient of Jacky Kindle, FNP. The Care Management team was consulted for assistance.      Engaged with patient by telephone.  Assessment:  Outpatient Encounter Medications as of 12/14/2022  Medication Sig Note   insulin degludec (TRESIBA FLEXTOUCH) 100 UNIT/ML FlexTouch Pen Inject 28 Units into the skin daily. Patient receives via [employer] Patient Assistance through December 2024    metoprolol succinate (TOPROL-XL) 25 MG 24 hr tablet TAKE 2 TABLETS BY MOUTH ONCE DAILY (50MG  DOSE)    Semaglutide,0.25 or 0.5MG /DOS, (OZEMPIC, 0.25 OR 0.5 MG/DOSE,) 2 MG/3ML SOPN Inject 0.5 mg into the skin once a week. Patient receives via [employer] Patient Assistance through December 2024 12/14/2022: Reports taking the 0.5mg  dose   Alcohol Swabs (ALCOHOL PADS) 70 % PADS 1 each by Does not apply route in the morning and at bedtime.    alendronate (FOSAMAX) 70 MG tablet Take 1 tablet (70 mg total) by mouth every 7 (seven) days. Take with a full glass of water on an empty stomach.    atorvastatin (LIPITOR) 20 MG tablet TAKE ONE TABLET BY MOUTH EVERYDAY AT BEDTIME    Calcium Carbonate-Vitamin D 600-400 MG-UNIT tablet Take 1 tablet by mouth daily.    Cyanocobalamin (B-12) 1000 MCG CAPS Take 1,000 mcg by mouth daily.    FARXIGA 10 MG TABS tablet TAKE ONE TABLET BY MOUTH BEFORE BREAKFAST    furosemide (LASIX) 20 MG tablet TAKE 1 TABLET BY MOUTH ONCE DAILY    hydrOXYzine (ATARAX) 25 MG tablet TAKE 1 TABLET BY MOUTH THREE TIMES A DAY AS NEEDED    Insulin Pen Needle (TRUEPLUS PEN NEEDLES) 31G X 6 MM MISC Use with lantus two times daily.    losartan (COZAAR) 25 MG tablet TAKE 1 TABLET BY MOUTH ONCE DAILY    metoprolol succinate (TOPROL-XL) 50 MG 24 hr tablet TAKE ONE TABLET BY MOUTH ONCE DAILY (Patient not taking: Reported on 12/14/2022)     montelukast (SINGULAIR) 10 MG tablet TAKE ONE TABLET BY MOUTH EVERYDAY AT BEDTIME    Multiple Vitamins-Minerals (PRESERVISION AREDS 2 PO) Take 1 tablet by mouth in the morning and at bedtime.    Omega-3 1000 MG CAPS Take 1,000 mg by mouth daily.    omeprazole (PRILOSEC) 40 MG capsule TAKE 1 CAPSULE BY MOUTH EVERY MORNING *REFILL REQUEST*    OneTouch Delica Lancets 33G MISC To check blood sugar three times daily  DX: E11.42    ONETOUCH VERIO test strip Use to check blood sugar three times daily. DX E11.42    oxybutynin (DITROPAN-XL) 10 MG 24 hr tablet TAKE 1 TABLET BY MOUTH EVERY DAY AT BEDTIME    sertraline (ZOLOFT) 25 MG tablet TAKE 1 TABLET BY MOUTH ONCE DAILY    silver sulfADIAZINE (SILVADENE) 1 % cream APPLY 1 APPLICATION TOPICALLY DAILY AS DIRECTED *REFILL REQUEST* (Patient not taking: Reported on 12/07/2022)    Vitamin D, Ergocalciferol, (DRISDOL) 1.25 MG (50000 UNIT) CAPS capsule TAKE 1 CAPSULE BY MOUTH ONCE WEEKLY ON FRIDAY *REFILL REQUEST*    No facility-administered encounter medications on file as of 12/14/2022.    Interventions:   Goals Addressed             This Visit's Progress    Care Management       Current Barriers:  Chronic Disease Management support and  education needs related to Hypertension  Planned Interventions: Reviewed current treatment plan for hypertension management. Reports compliance with medications.  Reviewed information regarding established blood pressure parameters. Reports not currently monitoring due to not having a BP monitoring device. Discussed options for obtaining a new device via the Over the Counter benefit offered by her insurance plan.  Advised to monitor her blood pressure a few times a week if unable to monitor daily. Advised to maintain a blood pressure log.  Reviewed symptoms. Denies chest pain or palpitations. Denies headaches, dizziness, or visual changes. Denies changes or decline in activity tolerance. Reports completing  activities/exercises in the home a few times a week without complication. Reviewed nutritional intake. Reports attempting to adhere to a heart healthy diet but not consistently monitoring nutrition labels. Continues to eat out occasionally but reports consuming mostly small meals and sandwiches at home.  Encouraged to read nutrition labels, continue monitoring sodium intake, and avoid highly processed foods when possible.  Reviewed s/sx of heart attack, stroke and worsening symptoms that require immediate medical attention.  BP Readings from Last 3 Encounters:  12/07/22 (!) 129/54  10/20/22 133/65  09/23/22 (!) 125/52     Patient Goals/Self Care Activities: Take medications as prescribed   Attend all scheduled provider appointments Call pharmacy for medication refills 3-7 days in advance of running out of medications Call provider office for new concerns or questions  Check blood pressure at least a few times a week Keep a blood pressure log Call doctor for signs and symptoms of high blood pressure Report new symptoms to your doctor Eat whole grains, fruits and vegetables, lean meats and healthy fats Limit salt intake   ------------------------------------------------------------------------------------------------------------------------------------------------------------------------------------ Current Barriers:  Care Management support and education needs related to Diabetes  Planned Interventions: Reviewed plan for diabetes management.  Reviewed medications. Reports taking medications as prescribed. Currently working with the care team pharmacist regarding medication management. Denies urgent concerns related to prescription cost.  Reviewed blood glucose readings. Reports fasting readings range from the 90s to low 100s. Fasting reading today was 97 mg/dl. Advised to continue monitoring and maintain a log. Reviewed s/sx of hyperglycemia and hypoglycemia along with appropriate  interventions. Denies recent hypoglycemic or hyperglycemic episodes.  Reviewed nutritional intake. Reports improvement with nutritional intake. Continues to eat out occasionally. Notes eating mostly small meals and sandwiches during the day. Discussed several low carb meal and bread options. Declined current need for additional nutritional resources. Reviewed preventive health exams. Currently receiving eye care with Dr. Dingledein/[facility]. Currently receiving podiatry services with Dr. [facility]. Eye and Foot exams are up to date.   Lab Results  Component Value Date   HGBA1C 7.2 (H) 09/23/2022    Lab Results  Component Value Date   HGBA1C 7.6 (H) 03/10/2022     Patient Goals/Self Care Activities: Take medications as prescribed   Attend all scheduled provider appointments Call pharmacy for medication refills 3-7 days in advance of running out of medications Check blood sugars daily  Keep a blood sugar log Check feet daily for cuts, sores or redness Wash and dry feet carefully every day Wear comfortable, cotton socks Wear comfortable, well-fitting shoes Read food labels for fat, fiber, carbohydrates and portion size Call provider office for new concerns or questions                 PLAN Will follow up next month.    Katina Degree Health  [facility], [facility] Health RN Care  Manager Direct Dial: [phone] Website: [email or web address]

## 2022-12-14 NOTE — Patient Instructions (Signed)
Thank you for allowing the Care Management team to participate in your care. It was great speaking with you today.   Our next outreach is scheduled for January 07, 2023 at 1000. Please do not hesitate to reach out if you require assistance prior to our next outreach.   Katina Degree Health  [facility], [facility] Health RN Care Manager Direct Dial: [phone] Website: [email or web address]

## 2022-12-22 ENCOUNTER — Other Ambulatory Visit: Payer: Self-pay | Admitting: Pharmacist

## 2022-12-22 NOTE — Progress Notes (Unsigned)
12/22/2022 Name: Andrea [name] MRN: [id number] DOB: [birth year]  Chief Complaint  Patient presents with   Medication Assistance   Medication Management    Andrea [name] is a 77 y.o. year old female who presented for a telephone visit.   They were referred to the pharmacist by their PCP for assistance in managing diabetes and medication access.      Subjective:   Care Team: Primary Care Provider: Jacky Kindle, FNP ; Next Scheduled Visit: tomorrow Cardiologist: Antonieta Iba, MD Nurse Care Manager: Juanell Fairly, RN; Next Scheduled 01/07/2023  Medication Access/Adherence  Current Pharmacy:  [facility], [place] - [address] [place] [address] Phone: [phone] Fax: [phone]  [id number] - [place], [place] - [address] [facility] [address] [place] [address] Phone: [phone] Fax: [phone]   Patient reports affordability concerns with their medications: No  Patient reports access/transportation concerns to their pharmacy: No  Patient reports adherence concerns with their medications:  No     Patient uses weekly pillbox (transfers from pill pack to pillbox to incorporate)   Today patient shares that she moved her prescriptions to [facility] since [facility] closed   Diabetes:   Current medications:  - Farxiga 10 mg daily each morning - Tresiba 28 units daily - Ozempic 0.5 mg weekly on Wednesdays   Medications tried in the past:    Current morning fasting readings: today: 97; recently ranging: 97-120 Using One Touch Verio meter   Patient denies hypoglycemic s/sx including dizziness, shakiness, sweating.  - Reports carries glucose tablets with her   Statin therapy: atorvastatin 20 mg daily   Current medication access support: enrolled in patient assistance program for [place] and Ozempic from [employer] through 03/08/2023 -   States believes she has as enough of Ozempic and Tresiba to last though end of calendar year   Objective:  Lab Results  Component Value Date   HGBA1C 7.2 (H) 09/23/2022    Lab Results  Component Value Date   CREATININE 1.49 (H) 09/23/2022   BUN 25 09/23/2022   NA 141 09/23/2022   K 5.2 09/23/2022   CL 100 09/23/2022   CO2 22 09/23/2022  Calculate CrCl (based on adjusted body weight) ~26 ml/min  Lab Results  Component Value Date   CHOL 155 06/10/2022   HDL 68 06/10/2022   LDLCALC 61 06/10/2022   TRIG 157 (H) 06/10/2022   CHOLHDL 2.3 06/10/2022   Last vitamin D Lab Results  Component Value Date   VD25OH 82.1 09/23/2022     Medications Reviewed Today     Reviewed by Manuela Neptune, RPH-CPP (Pharmacist) on 12/22/22 at 1044  Med List Status: <None>   Medication Order Taking? Sig Documenting Provider Last Dose Status Informant  Alcohol Swabs (ALCOHOL PADS) 70 % PADS [id number]  1 each by Does not apply route in the morning and at bedtime. Jacky Kindle, FNP  Active   alendronate (FOSAMAX) 70 MG tablet [id number]  Take 1 tablet (70 mg total) by mouth every 7 (seven) days. Take with a full glass of water on an empty stomach. Jacky Kindle, FNP  Active   atorvastatin (LIPITOR) 20 MG tablet [id number]  TAKE ONE TABLET BY MOUTH EVERYDAY AT BEDTIME Jacky Kindle, FNP  Active   Calcium Carbonate-Vitamin D 600-400 MG-UNIT tablet [id number]  Take 1 tablet by mouth daily. [provider]  Active Nursing Home  Medication Administration Guide (MAG)  Cyanocobalamin (B-12) 1000 MCG CAPS [id number]  Take 1,000 mcg by mouth daily. [provider]  Active Nursing Home Medication Administration Guide (MAG)  FARXIGA 10 MG TABS tablet [id number] Yes TAKE ONE TABLET BY MOUTH BEFORE BREAKFAST Jacky Kindle, FNP Taking Active   furosemide (LASIX) 20 MG tablet [id number]  TAKE 1 TABLET BY MOUTH ONCE DAILY Jacky Kindle, FNP  Active   hydrOXYzine (ATARAX) 25 MG tablet [id number]   TAKE 1 TABLET BY MOUTH THREE TIMES A DAY AS NEEDED Jacky Kindle, FNP  Active   insulin degludec (TRESIBA FLEXTOUCH) 100 UNIT/ML FlexTouch Pen [id number] Yes Inject 28 Units into the skin daily. Patient receives via [employer] Patient Assistance through December 2024 Jacky Kindle, FNP Taking Active   Insulin Pen Needle (TRUEPLUS PEN NEEDLES) 31G X 6 MM MISC [id number]  Use with lantus two times daily. Erasmo Downer, MD  Active Nursing Home Medication Administration Guide (MAG)  losartan (COZAAR) 25 MG tablet [id number]  TAKE 1 TABLET BY MOUTH ONCE DAILY [name] T, FNP  Active   metoprolol succinate (TOPROL-XL) 25 MG 24 hr tablet [id number]  TAKE 2 TABLETS BY MOUTH ONCE DAILY (50MG  DOSE) Jacky Kindle, FNP  Active   metoprolol succinate (TOPROL-XL) 50 MG 24 hr tablet [id number]  TAKE ONE TABLET BY MOUTH ONCE DAILY  Patient not taking: Reported on 12/14/2022   Jacky Kindle, FNP  Active   montelukast (SINGULAIR) 10 MG tablet [id number]  TAKE ONE TABLET BY MOUTH EVERYDAY AT BEDTIME Jacky Kindle, FNP  Active   Multiple Vitamins-Minerals (PRESERVISION AREDS 2 PO) [id number]  Take 1 tablet by mouth in the morning and at bedtime. [provider]  Active Nursing Home Medication Administration Guide (MAG)  Omega-3 1000 MG CAPS [id number]  Take 1,000 mg by mouth daily. [provider]  Active Nursing Home Medication Administration Guide (MAG)  omeprazole (PRILOSEC) 40 MG capsule [id number]  TAKE 1 CAPSULE BY MOUTH EVERY MORNING *REFILL REQUESTJacky Kindle, FNP  Active   OneTouch Delica Lancets 33G MISC [id number]  To check blood sugar three times daily  DX: E11.42 Jacky Kindle, FNP  Active   [facility] VERIO test strip [id number]  Use to check blood sugar three times daily. DX E11.42 [name] T, FNP  Active   oxybutynin (DITROPAN-XL) 10 MG 24 hr tablet [id number]  TAKE 1 TABLET BY MOUTH EVERY DAY AT BEDTIME Jacky Kindle, FNP  Active   Semaglutide,0.25 or 0.5MG /DOS, (OZEMPIC,  0.25 OR 0.5 MG/DOSE,) 2 MG/3ML SOPN [id number] Yes Inject 0.5 mg into the skin once a week. Patient receives via [employer] Patient Assistance through December 2024 Jacky Kindle, FNP Taking Active            Med Note Constance Haw, Robyn Haber   Tue Dec 14, 2022 10:16 AM) Reports taking the 0.5mg  dose  sertraline (ZOLOFT) 25 MG tablet [id number]  TAKE 1 TABLET BY MOUTH ONCE DAILY [name] T, FNP  Active   silver sulfADIAZINE (SILVADENE) 1 % cream [id number]  APPLY 1 APPLICATION TOPICALLY DAILY AS DIRECTED *REFILL REQUEST*  Patient not taking: Reported on 12/07/2022   Jacky Kindle, FNP  Active   Vitamin D, Ergocalciferol, (DRISDOL) 1.25 MG (50000 UNIT) CAPS capsule [id number]  TAKE 1 CAPSULE BY MOUTH ONCE WEEKLY ON FRIDAY *REFILL REQUESTJacky Kindle, FNP  Active   Med List Note Nelia Shi, CPhT 12/18/20 1047): [employer] [phone]  Assessment/Plan:   Renal considerations: Based on latest lab from 09/23/2022, renal function reduced and Calculate CrCl (based on adjusted body weight) ~26 ml/min - Reports that she has been staying well-hydrated - Denies any use of NSAIDs - Will collaborate with PCP to ask provider consider recheck of renal lab work tomorrow. Note if CrCl remains < 35, use of alendronate is not recommended. Would recommend switching to an alternative medication  From review of chart, note that following latest Vit D lab, PCP recommended patient "can finish her current supply of one 50,000 IU once weekly; and then can start daily maintenance dose of 1000 IU" However, note treatment dose of Vitamin D was reordered since and patient continued to take higher dose.  - Will collaborate with PCP to ask provider consider canceling high dose Vitamin D prescription at pharmacy and counseling patient on this change at appointment tomorrow  Diabetes: - Currently uncontrolled - Reviewed long term cardiovascular and renal outcomes of uncontrolled blood sugar -  Reviewed goal A1c, goal fasting, and goal 2 hour post prandial glucose - Reviewed dietary modifications including importance of having regular well-balanced meals while controlling carbohydrate portion sizes - Discuss benefits of continuous glucose monitoring (CGM). Patient denies interest in starting CGM at this time. - Recommend to check glucose, keep log of results and have this record to review during future medical appointments - Patient believes that she meets financial criteria for Farxiga patient assistance program through [employer].  - Will collaborate with PCP and CPhT to aid patient with applying for new application for Farxiga patient assistance from [employer] as well as re-enrollment for [place] and Ozempic from [employer] for 2025 calendar year    - Encourage patient to consider obtaining new home upper arm blood pressure monitor using health plan OTC benefit, restart checking home blood pressure and heart rate, keep log of results and have this record to review during future medical appointments     Follow Up Plan: Clinical Pharmacist will follow up with patient by telephone on 02/23/2023 at 10:30 AM    Estelle Grumbles, PharmD, [facility] [phone]

## 2022-12-23 ENCOUNTER — Ambulatory Visit: Payer: Medicare Other | Admitting: Family Medicine

## 2022-12-23 ENCOUNTER — Encounter: Payer: Self-pay | Admitting: Pharmacist

## 2022-12-23 ENCOUNTER — Encounter: Payer: Self-pay | Admitting: Family Medicine

## 2022-12-23 VITALS — BP 109/56 | HR 87 | Ht 60.0 in | Wt 135.9 lb

## 2022-12-23 DIAGNOSIS — I739 Peripheral vascular disease, unspecified: Secondary | ICD-10-CM

## 2022-12-23 DIAGNOSIS — E559 Vitamin D deficiency, unspecified: Secondary | ICD-10-CM

## 2022-12-23 DIAGNOSIS — E1142 Type 2 diabetes mellitus with diabetic polyneuropathy: Secondary | ICD-10-CM | POA: Diagnosis not present

## 2022-12-23 DIAGNOSIS — Z5320 Procedure and treatment not carried out because of patient's decision for unspecified reasons: Secondary | ICD-10-CM | POA: Diagnosis not present

## 2022-12-23 DIAGNOSIS — Z23 Encounter for immunization: Secondary | ICD-10-CM

## 2022-12-23 DIAGNOSIS — I25118 Atherosclerotic heart disease of native coronary artery with other forms of angina pectoris: Secondary | ICD-10-CM

## 2022-12-23 DIAGNOSIS — I5022 Chronic systolic (congestive) heart failure: Secondary | ICD-10-CM

## 2022-12-23 DIAGNOSIS — E0843 Diabetes mellitus due to underlying condition with diabetic autonomic (poly)neuropathy: Secondary | ICD-10-CM | POA: Insufficient documentation

## 2022-12-23 DIAGNOSIS — F17200 Nicotine dependence, unspecified, uncomplicated: Secondary | ICD-10-CM

## 2022-12-23 DIAGNOSIS — E785 Hyperlipidemia, unspecified: Secondary | ICD-10-CM

## 2022-12-23 MED ORDER — ATORVASTATIN CALCIUM 20 MG PO TABS
20.0000 mg | ORAL_TABLET | Freq: Every day | ORAL | 1 refills | Status: DC
Start: 2022-12-23 — End: 2023-07-28

## 2022-12-23 MED ORDER — METOPROLOL SUCCINATE ER 50 MG PO TB24
50.0000 mg | ORAL_TABLET | Freq: Every day | ORAL | 3 refills | Status: DC
Start: 2022-12-23 — End: 2023-11-23

## 2022-12-23 NOTE — Patient Instructions (Signed)
Goals Addressed             This Visit's Progress    Pharmacy Goals       Please watch the mail for an envelope from [facility] containing the patient assistance program application. Please complete this application and bring to office to have it faxed back to Attention: Linward Foster at Fax # [phone] along with a copy of your Medicare Part D prescription card and a copy of your proof of income document.  If you need to call Joni Reining, you can reach her at [phone].  If you need to reach out to patient assistance programs regarding refills or to find out the status of your application, you can do so by calling:  [employer] at [phone] [employer] at [phone]  Our goal A1c is less than 7%. This corresponds with fasting sugars less than 130 and 2 hour after meal sugars less than 180. Please keep a log of your results when checking your blood sugar   Our goal bad cholesterol, or LDL, is less than 70. This is why it is important to continue taking your atorvastatin.  Check your blood pressure twice weekly, and any time you have concerning symptoms like headache, chest pain, dizziness, shortness of breath, or vision changes.   Our goal is less than 130/80.  To appropriately check your blood pressure, make sure you do the following:  1) Avoid caffeine, exercise, or tobacco products for 30 minutes before checking. Empty your bladder. 2) Sit with your back supported in a flat-backed chair. Rest your arm on something flat (arm of the chair, table, etc). 3) Sit still with your feet flat on the floor, resting, for at least 5 minutes.  4) Check your blood pressure. Take 1-2 readings.  5) Write down these readings and bring with you to any provider appointments.  Bring your home blood pressure machine with you to a provider's office for accuracy comparison at least once a year.   Make sure you take your blood pressure medications before you come to any office visit,  even if you were asked to fast for labs.  Estelle Grumbles, PharmD, [facility] [phone]

## 2022-12-23 NOTE — Progress Notes (Signed)
Established patient visit   Patient: Andrea [name]   DOB: May 24, 1945   77 y.o. Female  MRN: [id number] Visit Date: 12/23/2022  Today's healthcare provider: Jacky Kindle, FNP  Introduced to nurse practitioner role and practice setting.  All questions answered.  Discussed provider/patient relationship and expectations.  Subjective    HPI HPI     Medication Refill    Additional comments: Metoprolol       Last edited by Rolly Salter, CMA on 12/23/2022 11:13 AM.      The patient, with a history of renal dysfunction and vitamin D supplementation, presents for a routine follow-up. The patient reports no new symptoms or concerns. The patient's renal function needs to be rechecked due to concerns about the use of spironolactone with a previous creatinine clearance less than thirty. The patient's vitamin D supplementation needs to be stopped due to concerns about reaching toxic levels. The patient also needs a refill of metoprolol. The patient continues to use tobacco, albeit not heavily, and acknowledges the associated health risks.  Medications: Outpatient Medications Prior to Visit  Medication Sig   Alcohol Swabs (ALCOHOL PADS) 70 % PADS 1 each by Does not apply route in the morning and at bedtime.   alendronate (FOSAMAX) 70 MG tablet Take 1 tablet (70 mg total) by mouth every 7 (seven) days. Take with a full glass of water on an empty stomach.   Calcium Carbonate-Vitamin D 600-400 MG-UNIT tablet Take 1 tablet by mouth daily.   Cyanocobalamin (B-12) 1000 MCG CAPS Take 1,000 mcg by mouth daily.   FARXIGA 10 MG TABS tablet TAKE ONE TABLET BY MOUTH BEFORE BREAKFAST   furosemide (LASIX) 20 MG tablet TAKE 1 TABLET BY MOUTH ONCE DAILY   insulin degludec (TRESIBA FLEXTOUCH) 100 UNIT/ML FlexTouch Pen Inject 28 Units into the skin daily. Patient receives via [employer] Patient Assistance through December 2024   Insulin Pen Needle (TRUEPLUS PEN NEEDLES) 31G X 6 MM MISC Use with lantus  two times daily.   losartan (COZAAR) 25 MG tablet TAKE 1 TABLET BY MOUTH ONCE DAILY   metoprolol succinate (TOPROL-XL) 25 MG 24 hr tablet TAKE 2 TABLETS BY MOUTH ONCE DAILY (50MG  DOSE)   montelukast (SINGULAIR) 10 MG tablet TAKE ONE TABLET BY MOUTH EVERYDAY AT BEDTIME   Multiple Vitamins-Minerals (PRESERVISION AREDS 2 PO) Take 1 tablet by mouth in the morning and at bedtime.   Omega-3 1000 MG CAPS Take 1,000 mg by mouth daily.   omeprazole (PRILOSEC) 40 MG capsule TAKE 1 CAPSULE BY MOUTH EVERY MORNING *REFILL REQUEST*   OneTouch Delica Lancets 33G MISC To check blood sugar three times daily  DX: E11.42   ONETOUCH VERIO test strip Use to check blood sugar three times daily. DX E11.42   oxybutynin (DITROPAN-XL) 10 MG 24 hr tablet TAKE 1 TABLET BY MOUTH EVERY DAY AT BEDTIME   Semaglutide,0.25 or 0.5MG /DOS, (OZEMPIC, 0.25 OR 0.5 MG/DOSE,) 2 MG/3ML SOPN Inject 0.5 mg into the skin once a week. Patient receives via [employer] Patient Assistance through December 2024   sertraline (ZOLOFT) 25 MG tablet TAKE 1 TABLET BY MOUTH ONCE DAILY   Vitamin D, Ergocalciferol, (DRISDOL) 1.25 MG (50000 UNIT) CAPS capsule TAKE 1 CAPSULE BY MOUTH ONCE WEEKLY ON FRIDAY *REFILL REQUEST*   [DISCONTINUED] atorvastatin (LIPITOR) 20 MG tablet TAKE ONE TABLET BY MOUTH EVERYDAY AT BEDTIME   [DISCONTINUED] hydrOXYzine (ATARAX) 25 MG tablet TAKE 1 TABLET BY MOUTH THREE TIMES A DAY AS NEEDED   [DISCONTINUED] silver  sulfADIAZINE (SILVADENE) 1 % cream APPLY 1 APPLICATION TOPICALLY DAILY AS DIRECTED *REFILL REQUEST*   [DISCONTINUED] metoprolol succinate (TOPROL-XL) 50 MG 24 hr tablet TAKE ONE TABLET BY MOUTH ONCE DAILY (Patient not taking: Reported on 12/23/2022)   No facility-administered medications prior to visit.     Objective    BP (!) 109/56 (BP Location: Left Arm, Patient Position: Sitting, Cuff Size: Large)   Pulse 87   Ht 5' (1.524 m)   Wt 135 lb 14.4 oz (61.6 kg)   SpO2 97%   BMI 26.54 kg/m   Physical  Exam Vitals and nursing note reviewed.  Constitutional:      General: She is not in acute distress.    Appearance: Normal appearance. She is overweight. She is not ill-appearing, toxic-appearing or diaphoretic.  HENT:     Head: Normocephalic and atraumatic.  Cardiovascular:     Rate and Rhythm: Normal rate and regular rhythm.     Pulses: Normal pulses.     Heart sounds: Normal heart sounds. No murmur heard.    No friction rub. No gallop.  Pulmonary:     Effort: Pulmonary effort is normal. No respiratory distress.     Breath sounds: Normal breath sounds. No stridor. No wheezing, rhonchi or rales.  Chest:     Chest wall: No tenderness.  Musculoskeletal:        General: No swelling, tenderness, deformity or signs of injury. Normal range of motion.     Right lower leg: No edema.     Left lower leg: No edema.  Skin:    General: Skin is warm and dry.     Capillary Refill: Capillary refill takes less than 2 seconds.     Coloration: Skin is not jaundiced or pale.     Findings: No bruising, erythema, lesion or rash.  Neurological:     Mental Status: She is alert and oriented to person, place, and time. Mental status is at baseline.     Cranial Nerves: No cranial nerve deficit.     Sensory: No sensory deficit.     Motor: Weakness present.     Coordination: Coordination normal.     Gait: Gait abnormal.     Comments: Age related debility with use of walker  Psychiatric:        Mood and Affect: Mood normal.        Behavior: Behavior normal.        Thought Content: Thought content normal.        Judgment: Judgment normal.     No results found for any visits on 12/23/22.  Assessment & Plan     Problem List Items Addressed This Visit       Cardiovascular and Mediastinum   Atherosclerotic heart disease of native coronary artery with other forms of angina pectoris (HCC)   Relevant Medications   metoprolol succinate (TOPROL-XL) 50 MG 24 hr tablet   atorvastatin (LIPITOR) 20 MG tablet    Other Relevant Orders   CBC   Hemoglobin A1c   Urine Microalbumin w/creat. ratio   Basic Metabolic Panel (BMET)   Chronic systolic CHF (congestive heart failure) (HCC)   Relevant Medications   metoprolol succinate (TOPROL-XL) 50 MG 24 hr tablet   atorvastatin (LIPITOR) 20 MG tablet   Other Relevant Orders   Lipid panel   CBC   Hemoglobin A1c   Urine Microalbumin w/creat. ratio   Basic Metabolic Panel (BMET)   PAD (peripheral artery disease) (HCC)   Relevant Medications  metoprolol succinate (TOPROL-XL) 50 MG 24 hr tablet   atorvastatin (LIPITOR) 20 MG tablet   Other Relevant Orders   CBC   Hemoglobin A1c   Urine Microalbumin w/creat. 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 ratio   Basic Metabolic Panel (BMET)     Other   Colon cancer screening declined   Flu vaccine need   Relevant Orders   Flu Vaccine Trivalent High Dose (Fluad) (Completed)   Hyperlipidemia LDL goal <70   Relevant Medications   metoprolol succinate (TOPROL-XL) 50 MG 24 hr tablet   atorvastatin (LIPITOR) 20 MG tablet   Other Relevant Orders   Lipid panel   Tobacco dependence   Vitamin D deficiency  Chronic Kidney Disease Last renal function check showed eGFR of 26. Concerns raised by pharmacist about the use of spironolactone with eGFR less than 30. -Plan to recheck renal function. If eGFR remains less than 30, consider alternative medication for fluid management.  Vitamin D Supplementation Patient has been continuing weekly vitamin D supplementation despite having achieved adequate levels. -Plan to stop Rx strength vitamin D supplementation. Instructed patient not to take any more doses and continue with OTC dose of 1000-2000IU daily. Will contact  pharmacy to stop refills.  Metoprolol Refill Patient's metoprolol was not included in her new medication package due to lack of refills. -Plan to send a new prescription for metoprolol 50mg  to the patient's mail order pharmacy. Instructed patient to contact the office if the medication does not arrive in a timely manner.  Tobacco Use Patient continues to use tobacco, albeit not heavily. -Advised patient on the risks of continued tobacco use, particularly in the context of her existing cardiovascular risk factors.  General Health Maintenance -Plan to order labs to check metabolic panel and cholesterol. -Advised patient to schedule her 6 month f/u with PCP following her wellness visit with Steward Drone on April 29th. -Declined colon cancer screening. -Continue monitoring blood sugars, blood pressure, and weight.      Leilani Merl, FNP, have reviewed all documentation for this visit. The documentation on 12/23/22 for the exam, diagnosis, procedures, and orders are all accurate and complete.  Jacky Kindle, FNP  [facility] [phone] (phone) [phone] (fax)  [facility]

## 2022-12-27 NOTE — Progress Notes (Signed)
Called [facility] pharmacy to discontinue high dose vitamin D. Pharmacist discontinued.

## 2022-12-30 DIAGNOSIS — I5022 Chronic systolic (congestive) heart failure: Secondary | ICD-10-CM | POA: Diagnosis not present

## 2022-12-30 DIAGNOSIS — E0843 Diabetes mellitus due to underlying condition with diabetic autonomic (poly)neuropathy: Secondary | ICD-10-CM | POA: Diagnosis not present

## 2022-12-30 DIAGNOSIS — I739 Peripheral vascular disease, unspecified: Secondary | ICD-10-CM | POA: Diagnosis not present

## 2022-12-30 DIAGNOSIS — E785 Hyperlipidemia, unspecified: Secondary | ICD-10-CM | POA: Diagnosis not present

## 2022-12-30 DIAGNOSIS — I25118 Atherosclerotic heart disease of native coronary artery with other forms of angina pectoris: Secondary | ICD-10-CM | POA: Diagnosis not present

## 2022-12-31 LAB — CBC
Hematocrit: 39.5 % (ref 34.0–46.6)
Hemoglobin: 12.3 g/dL (ref 11.1–15.9)
MCH: 29.1 pg (ref 26.6–33.0)
MCHC: 31.1 g/dL — ABNORMAL LOW (ref 31.5–35.7)
MCV: 93 fL (ref 79–97)
Platelets: 186 10*3/uL (ref 150–450)
RBC: 4.23 x10E6/uL (ref 3.77–5.28)
RDW: 13.1 % (ref 11.7–15.4)
WBC: 7.9 10*3/uL (ref 3.4–10.8)

## 2022-12-31 LAB — BASIC METABOLIC PANEL
BUN/Creatinine Ratio: 23 (ref 12–28)
BUN: 34 mg/dL — ABNORMAL HIGH (ref 8–27)
CO2: 25 mmol/L (ref 20–29)
Calcium: 9.5 mg/dL (ref 8.7–10.3)
Chloride: 103 mmol/L (ref 96–106)
Creatinine, Ser: 1.48 mg/dL — ABNORMAL HIGH (ref 0.57–1.00)
Glucose: 155 mg/dL — ABNORMAL HIGH (ref 70–99)
Potassium: 5.2 mmol/L (ref 3.5–5.2)
Sodium: 142 mmol/L (ref 134–144)
eGFR: 36 mL/min/{1.73_m2} — ABNORMAL LOW (ref >59)

## 2022-12-31 LAB — LIPID PANEL
Chol/HDL Ratio: 2 {ratio} (ref 0.0–4.4)
Cholesterol, Total: 135 mg/dL (ref 100–199)
HDL: 69 mg/dL (ref >39)
LDL Chol Calc (NIH): 42 mg/dL (ref 0–99)
Triglycerides: 141 mg/dL (ref 0–149)
VLDL Cholesterol Cal: 24 mg/dL (ref 5–40)

## 2022-12-31 LAB — MICROALBUMIN / CREATININE URINE RATIO
Creatinine, Urine: 38.4 mg/dL
Microalb/Creat Ratio: 54 mg/g{creat} — ABNORMAL HIGH (ref 0–29)
Microalbumin, Urine: 20.9 ug/mL

## 2022-12-31 LAB — HEMOGLOBIN A1C
Est. average glucose Bld gHb Est-mCnc: 163 mg/dL
Hgb A1c MFr Bld: 7.3 % — ABNORMAL HIGH (ref 4.8–5.6)

## 2023-01-03 NOTE — Progress Notes (Signed)
Labs have been reviewed and are generally stable; anemia remains well controlled; Diabetes remains well controlled at 7.3%. Urine micro remains elevated but continues to improve with recent ratio of 54. Elevated creatinine remains in 1.4's Cholesterol is at goal.   Continued medications with previously discussed modifications for kidney levels as discussed with pharmacy.

## 2023-01-07 ENCOUNTER — Telehealth: Payer: Self-pay

## 2023-01-07 ENCOUNTER — Other Ambulatory Visit: Payer: Self-pay

## 2023-01-07 NOTE — Patient Outreach (Signed)
  Care Management   Outreach Note  01/07/2023 Name: DEEPTI GUNAWAN MRN: [id number] DOB: 1945/08/16  An unsuccessful outreach attempt was made today for a scheduled Care Management visit.    Follow Up Plan:  Unable to leave a voice message.  A member of the care team will make additional attempts.   Katina [facility]  [facility], [facility] RN Care Manager Direct Dial: [phone] Website: [email or web address]

## 2023-01-10 ENCOUNTER — Telehealth: Payer: Self-pay

## 2023-01-10 ENCOUNTER — Other Ambulatory Visit: Payer: Self-pay

## 2023-01-10 NOTE — Telephone Encounter (Signed)
-----   Message from Manuela Neptune sent at 12/23/2022  7:35 AM EDT ----- Good Morning!  Would you please assist patient with applying for: - NEW application for Farxiga patient assistance from [employer] as prescribed by PCP - Renewal application for [place] and Ozempic from [employer] as prescribed by PCP  Thank you!  Gentry Fitz

## 2023-01-10 NOTE — Patient Outreach (Signed)
  Care Management   Outreach Note  01/10/2023 Name: Andrea [name] MRN: [id number] DOB: September 01, 1945   Message received from Ms. Felipa Furnace.  Reports missing phone call on 01/07/23 for scheduled appointment. Attempted to reach Ms. Flynt after receiving call today without success.  Will make additional outreach attempts today.   Follow Up Plan:  Will make additional outreach attempts.   Katina Degree [facility]  [facility], [facility] RN Care Manager Direct Dial: [phone] Website: [email or web address]

## 2023-01-11 NOTE — Telephone Encounter (Signed)
PAP: Patient assistance application for Ozempic, Tresiba, and NOVOFINE PENTIPS has been approved by PAP Companies: [employer] from 11/21/2022 to 03/08/2023. Medication should be delivered to PAP Delivery: Provider's office For further shipping updates, please contact [employer] at [phone] Pt ID is: NO ID   PLEASE BE ADVISE I AM SENDING THE 2025 RENEW APPLICATION TO PT AS WELL DIFFERENT ENCOUNTER.  Georga Bora Rx Patient Advocate [phone]

## 2023-01-11 NOTE — Telephone Encounter (Signed)
PAP: PAP application for Loura Pardon, and Tresiba, ([employer] and [employer]) has been mailed to [employer] address on file. Will fax provider portion of application to provider's office when pt's portion is received.   PLEASE BE ADVISED   Melanee Spry CPhT Rx Patient Advocate [phone]

## 2023-01-12 ENCOUNTER — Other Ambulatory Visit: Payer: Self-pay

## 2023-01-12 NOTE — Telephone Encounter (Signed)
PAP: PAP application for Farxiga, Publishing rights manager ([employer])) has been mailed to pt's home address on file. Will fax provider portion of application to provider's office when pt's portion is received.   Please be advised error on the manufacture but I did mail [employer] PAP FOR FARXIGA TO HOME ADDRESS.

## 2023-01-13 NOTE — Patient Outreach (Signed)
  Care Management   Visit Note   Name: Andrea [name] MRN: [id number] DOB: 31-Aug-1945  Subjective: Andrea [name] is a 77 y.o. year old female who is a primary care patient of Jacky Kindle, FNP. The Care Management team was consulted for assistance.       Andrea [name] returned call. Anticipates being available to complete telephonic outreach on 01/12/23. Agreed to call or contact clinic for questions or concerns prior to scheduled outreach.   PLAN Will follow up on 01/12/23.   Katina [facility]  [facility], [facility] RN Care Manager Direct Dial: [phone] Website: [email or web address]

## 2023-01-17 NOTE — Patient Outreach (Signed)
  Care Management   Visit Note   Name: Andrea [name] MRN: [id number] DOB: 10/30/1945  Subjective: Andrea [name] is a 77 y.o. year old female who is a primary care patient of Jacky Kindle, FNP. The Care Management team was consulted for assistance.      Engaged with patient via telephone  Assessment:  Outpatient Encounter Medications as of 01/12/2023  Medication Sig Note   alendronate (FOSAMAX) 70 MG tablet Take 1 tablet (70 mg total) by mouth every 7 (seven) days. Take with a full glass of water on an empty stomach.    Calcium Carbonate-Vitamin D 600-400 MG-UNIT tablet Take 1 tablet by mouth daily.    FARXIGA 10 MG TABS tablet TAKE ONE TABLET BY MOUTH BEFORE BREAKFAST    furosemide (LASIX) 20 MG tablet TAKE 1 TABLET BY MOUTH ONCE DAILY    insulin degludec (TRESIBA FLEXTOUCH) 100 UNIT/ML FlexTouch Pen Inject 28 Units into the skin daily. Patient receives via [employer] Patient Assistance through December 2024    losartan (COZAAR) 25 MG tablet TAKE 1 TABLET BY MOUTH ONCE DAILY    metoprolol succinate (TOPROL-XL) 50 MG 24 hr tablet Take 1 tablet (50 mg total) by mouth daily.    montelukast (SINGULAIR) 10 MG tablet TAKE ONE TABLET BY MOUTH EVERYDAY AT BEDTIME    Multiple Vitamins-Minerals (PRESERVISION AREDS 2 PO) Take 1 tablet by mouth in the morning and at bedtime.    Omega-3 1000 MG CAPS Take 1,000 mg by mouth daily.    omeprazole (PRILOSEC) 40 MG capsule TAKE 1 CAPSULE BY MOUTH EVERY MORNING *REFILL REQUEST*    oxybutynin (DITROPAN-XL) 10 MG 24 hr tablet TAKE 1 TABLET BY MOUTH EVERY DAY AT BEDTIME    Semaglutide,0.25 or 0.5MG /DOS, (OZEMPIC, 0.25 OR 0.5 MG/DOSE,) 2 MG/3ML SOPN Inject 0.5 mg into the skin once a week. Patient receives via [employer] Patient Assistance through December 2024 12/14/2022: Reports taking the 0.5mg  dose   sertraline (ZOLOFT) 25 MG tablet TAKE 1 TABLET BY MOUTH ONCE DAILY    Alcohol Swabs (ALCOHOL PADS) 70 % PADS 1 each by Does not apply route in the  morning and at bedtime.    atorvastatin (LIPITOR) 20 MG tablet Take 1 tablet (20 mg total) by mouth daily.    Cyanocobalamin (B-12) 1000 MCG CAPS Take 1,000 mcg by mouth daily. (Patient not taking: Reported on 01/12/2023)    Insulin Pen Needle (TRUEPLUS PEN NEEDLES) 31G X 6 MM MISC Use with lantus two times daily.    metoprolol succinate (TOPROL-XL) 25 MG 24 hr tablet TAKE 2 TABLETS BY MOUTH ONCE DAILY (50MG  DOSE) (Patient not taking: Reported on 01/12/2023)    OneTouch Delica Lancets 33G MISC To check blood sugar three times daily  DX: E11.42    ONETOUCH VERIO test strip Use to check blood sugar three times daily. DX E11.42    Vitamin D, Ergocalciferol, (DRISDOL) 1.25 MG (50000 UNIT) CAPS capsule TAKE 1 CAPSULE BY MOUTH ONCE WEEKLY ON FRIDAY *REFILL REQUEST* (Patient not taking: Reported on 01/12/2023)    No facility-administered encounter medications on file as of 01/12/2023.

## 2023-02-07 ENCOUNTER — Other Ambulatory Visit: Payer: Self-pay | Admitting: Family Medicine

## 2023-02-07 DIAGNOSIS — E1142 Type 2 diabetes mellitus with diabetic polyneuropathy: Secondary | ICD-10-CM

## 2023-02-10 ENCOUNTER — Telehealth: Payer: Self-pay | Admitting: Family Medicine

## 2023-02-10 NOTE — Telephone Encounter (Signed)
Spoke with daughter to confirm [employer] assistance packet is complete that she dropped off 02/07/23 and is completed and faxed as of today 02/10/23 to pharmacist

## 2023-02-21 NOTE — Telephone Encounter (Signed)
RECEIVED PT PG AND FAXED PROVIDER PAGES TO OFFICE OF [facility] OFFICE FARXIGA  AND OZEMPIC to [employer] AND [employer]   PLEASE ADVISE.

## 2023-02-23 ENCOUNTER — Other Ambulatory Visit: Payer: Self-pay | Admitting: Pharmacist

## 2023-02-23 NOTE — Patient Instructions (Signed)
Goals Addressed             This Visit's Progress    Pharmacy Goals       If you need to call Pharmacy Technician Joni Reining, you can reach her at [phone].  If you need to reach out to patient assistance programs regarding refills or to find out the status of your application, you can do so by calling:  [employer] at [phone] [employer] at [phone]  Our goal A1c is less than 7%. This corresponds with fasting sugars less than 130 and 2 hour after meal sugars less than 180. Please keep a log of your results when checking your blood sugar   Our goal bad cholesterol, or LDL, is less than 70. This is why it is important to continue taking your atorvastatin.  Check your blood pressure twice weekly, and any time you have concerning symptoms like headache, chest pain, dizziness, shortness of breath, or vision changes.   Our goal is less than 130/80.  To appropriately check your blood pressure, make sure you do the following:  1) Avoid caffeine, exercise, or tobacco products for 30 minutes before checking. Empty your bladder. 2) Sit with your back supported in a flat-backed chair. Rest your arm on something flat (arm of the chair, table, etc). 3) Sit still with your feet flat on the floor, resting, for at least 5 minutes.  4) Check your blood pressure. Take 1-2 readings.  5) Write down these readings and bring with you to any provider appointments.  Bring your home blood pressure machine with you to a provider's office for accuracy comparison at least once a year.   Make sure you take your blood pressure medications before you come to any office visit, even if you were asked to fast for labs.  Estelle Grumbles, PharmD, [facility] [phone]

## 2023-02-23 NOTE — Progress Notes (Signed)
02/23/2023 Name: Andrea [name] MRN: [id number] DOB: [birth year]  Chief Complaint  Patient presents with   Medication Management   Medication Adherence   Medication Assistance    Andrea [name] is a 77 y.o. year old female who presented for a telephone visit.   They were referred to the pharmacist by their PCP for assistance in managing diabetes and medication access.      Subjective:   Care Team: Primary Care Provider: Jacky Kindle, FNP ; Next Scheduled Visit: 07/06/2023 Cardiologist: Antonieta Iba, MD; Next Scheduled Visit: 03/15/2023 Nurse Care Manager: Juanell Fairly, RN    Medication Access/Adherence  Current Pharmacy:  [facility], [place] - [address] [place] [address] Phone: [phone] Fax: [phone]  [id number] - [place], [place] - [address] AT [facility] [address] [place] [address] Phone: [phone] Fax: [phone]   Patient reports affordability concerns with their medications: No  Patient reports access/transportation concerns to their pharmacy: No  Patient reports adherence concerns with their medications:  No     Patient uses weekly pillbox (transfers from pill pack to pillbox to incorporate)    Diabetes:   Current medications:  - Farxiga 10 mg daily each morning - Tresiba 28 units daily - Ozempic 0.5 mg weekly on Wednesdays   Medications tried in the past:    Current morning fasting readings: today: 98; recently ranging: 78-139 Using One Touch Verio meter   Patient denies hypoglycemic s/sx including dizziness, shakiness, sweating.  - Reports carries glucose tablets with her   Statin therapy: atorvastatin 20 mg daily   Current medication access support: Collaborating with PCP and CPhT for aid with re-enrollment in assistance program for [place] and Ozempic from [employer] and enrollment for [place] patient assistance  from [employer] - Note CPhT has received patient's portion of these applications back and faxed provider portion to PCP on 02/21/2023 -  States has as enough of Ozempic and [place] to last though end of calendar year   Hypertension/Heart Failure:   Current medications:  ACEi/ARB/ARNI: Losartan 25 mg daily SGLT2i: Farxiga 10 mg daily each morning Beta blocker: Metoprolol ER 50 mg daily Diuretic regimen: Furosemide 20 mg daily    Medications previously tried: Jardiance (Yeast Infection)    Reports has a home upper arm home BP cuff, but denies checking home BP recently   Patient denies hypotensive s/sx including dizziness, lightheadedness.     Objective:  Lab Results  Component Value Date   HGBA1C 7.3 (H) 12/30/2022    Lab Results  Component Value Date   CREATININE 1.48 (H) 12/30/2022   BUN 34 (H) 12/30/2022   NA 142 12/30/2022   K 5.2 12/30/2022   CL 103 12/30/2022   CO2 25 12/30/2022    Lab Results  Component Value Date   CHOL 135 12/30/2022   HDL 69 12/30/2022   LDLCALC 42 12/30/2022   TRIG 141 12/30/2022   CHOLHDL 2.0 12/30/2022   BP Readings from Last 3 Encounters:  12/23/22 (!) 109/56  12/07/22 (!) 129/54  10/20/22 133/65   Pulse Readings from Last 3 Encounters:  12/23/22 87  12/07/22 85  10/20/22 88    Medications Reviewed Today     Reviewed by Manuela Neptune, RPH-CPP (Pharmacist) on 02/23/23 at 1047  Med List Status: <None>   Medication Order Taking? Sig Documenting Provider Last Dose Status Informant  Alcohol Swabs (ALCOHOL PADS) 70 % PADS  [id number]  1 each by Does not apply route in the morning and at bedtime. Jacky Kindle, FNP  Active   alendronate (FOSAMAX) 70 MG tablet [id number]  Take 1 tablet (70 mg total) by mouth every 7 (seven) days. Take with a full glass of water on an empty stomach. Jacky Kindle, FNP  Active   atorvastatin (LIPITOR) 20 MG tablet [id number]  Take 1 tablet (20 mg total) by mouth daily. Jacky Kindle, FNP  Active    Calcium Carbonate-Vitamin D 600-400 MG-UNIT tablet [id number]  Take 1 tablet by mouth daily. [provider]  Active Nursing Home Medication Administration Guide (MAG)  Cholecalciferol 25 MCG (1000 UT) capsule [id number] Yes Take 1,000 Units by mouth daily. [provider]  Active   Cyanocobalamin (B-12) 1000 MCG CAPS [id number]  Take 1,000 mcg by mouth daily.  Patient not taking: Reported on 01/12/2023   [provider]  Active Nursing Home Medication Administration Guide (MAG)  FARXIGA 10 MG TABS tablet [id number] Yes TAKE 1 TABLET BY MOUTH DAILY BEFORE BREAKFAST Jacky Kindle, FNP Taking Active   furosemide (LASIX) 20 MG tablet [id number] Yes TAKE 1 TABLET BY MOUTH ONCE DAILY Jacky Kindle, FNP Taking Active   insulin degludec (TRESIBA FLEXTOUCH) 100 UNIT/ML FlexTouch Pen [id number] Yes Inject 28 Units into the skin daily. Patient receives via [employer] Patient Assistance through December 2024 Jacky Kindle, FNP Taking Active   Insulin Pen Needle (TRUEPLUS PEN NEEDLES) 31G X 6 MM MISC [id number]  Use with lantus two times daily. Erasmo Downer, MD  Active Nursing Home Medication Administration Guide (MAG)  losartan (COZAAR) 25 MG tablet [id number] Yes TAKE 1 TABLET BY MOUTH ONCE DAILY Jacky Kindle, FNP Taking Active   metoprolol succinate (TOPROL-XL) 50 MG 24 hr tablet [id number] Yes Take 1 tablet (50 mg total) by mouth daily. Jacky Kindle, FNP Taking Active   montelukast (SINGULAIR) 10 MG tablet [id number]  TAKE ONE TABLET BY MOUTH EVERYDAY AT BEDTIME Jacky Kindle, FNP  Active   Multiple Vitamins-Minerals (PRESERVISION AREDS 2 PO) [id number]  Take 1 tablet by mouth in the morning and at bedtime. [provider]  Active Nursing Home Medication Administration Guide (MAG)  Omega-3 1000 MG CAPS [id number]  Take 1,000 mg by mouth daily. [provider]  Active Nursing Home Medication Administration Guide (MAG)  omeprazole (PRILOSEC) 40 MG capsule  [id number]  TAKE 1 CAPSULE BY MOUTH EVERY MORNING *REFILL REQUESTJacky Kindle, FNP  Active   OneTouch Delica Lancets 33G MISC [id number]  To check blood sugar three times daily  DX: E11.42 Jacky Kindle, FNP  Active   [facility] VERIO test strip [id number]  Use to check blood sugar three times daily. DX E11.42 [name] T, FNP  Active   oxybutynin (DITROPAN-XL) 10 MG 24 hr tablet [id number]  TAKE 1 TABLET BY MOUTH EVERY DAY AT BEDTIME Jacky Kindle, FNP  Active   Semaglutide,0.25 or 0.5MG /DOS, (OZEMPIC, 0.25 OR 0.5 MG/DOSE,) 2 MG/3ML SOPN [id number] Yes Inject 0.5 mg into the skin once a week. Patient receives via [employer] Patient Assistance through December 2024 Jacky Kindle, FNP Taking Active            Med Note Constance Haw, Robyn Haber   Tue Dec 14, 2022 10:16 AM) Reports taking the 0.5mg  dose  sertraline (ZOLOFT) 25 MG tablet [id number]  TAKE 1 TABLET BY MOUTH ONCE DAILY Jacky Kindle, FNP  Active   Med List Note Aundria Rud,  Landry Dyke, CPhT 12/18/20 1047): [employer] [phone]              Assessment/Plan:   Diabetes: - Currently uncontrolled - Reviewed long term cardiovascular and renal outcomes of uncontrolled blood sugar - Reviewed goal A1c, goal fasting, and goal 2 hour post prandial glucose - Reviewed dietary modifications including importance of having regular well-balanced meals while controlling carbohydrate portion sizes - Recommend to check glucose, keep log of results and have this record to review during future medical appointments - Collaborating with PCP and CPhT to aid patient with applying for new application for Farxiga patient assistance from [employer] as well as re-enrollment for [place] and Ozempic from [employer] for 2025 calendar year    Hypertension/Heart Failure: - Encourage patient to restart checking home blood pressure and heart rate, keep log of results and have this record to review during future medical appointments       Follow Up Plan:  Clinical Pharmacist will follow up with patient by telephone on 05/04/2023 at 10:30 AM     Estelle Grumbles, PharmD, [facility] [phone]

## 2023-03-13 NOTE — Progress Notes (Signed)
 Date:  03/15/2023   ID:  Andrea [name], DOB [birth year], MRN [id number]  Patient Location:  [name] [place] [address]   Provider location:   Andrea [name], [place] office  PCP:  Andrea [name] DASEN, FNP  Cardiologist:  Andrea [name] [facility]   Chief Complaint  Patient presents with   12 month follow up     Doing well.     History of Present Illness:    Andrea [name] is a 78 y.o. female  past medical history of obesity,  Diabetes type II, Prior stroke/TIA Prior smoking, stopped >5 yrs ago hyperlipidemia,  right bundle branch block,  CAD myocardial infarction (in 1994-95) , mid anterior wall to apical region, anteroseptal, apical and apical inferior region systolic heart failure with ejection fraction of 45-50% in May 2014 Normal ejection fraction March 2018 Echo 2018: EF 55% Echo January 2023 EF 40 to 45% Who presents for follow-up of her coronary artery disease, history of previous MI  Last office visit 2/23 On last clinic visit was in a wheelchair In follow-up today she presents with family, walking without assistance Reports doing well, no recent chest pain or shortness of breath on exertion No falls No significant lower extremity edema, no PND orthopnea  Lab work reviewed A1C 7.3 Total chol 135, LDL 42  EKG personally reviewed by myself on todays visit EKG Interpretation Date/Time:  Tuesday March 15 2023 11:46:40 EST Ventricular Rate:  83 PR Interval:  210 QRS Duration:  146 QT Interval:  446 QTC Calculation: 524 R Axis:   -57  Text Interpretation: Sinus rhythm with sinus arrhythmia with 1st degree A-V block Right bundle branch block Left anterior fascicular block Bifascicular block When compared with ECG of 18-Dec-2020 10:20, No significant change was found Confirmed by Andrea [name] [phone]) on 03/15/2023 11:50:26 AM   History of hip surgery Complications from surgery developed left heel osteomyelitis with Pseudomonas Acute  kidney injury creatinine 2.9 with improvement  Echo 04/07/2021  1. Left ventricular ejection fraction, by estimation, is 40 to 45%. The  left ventricle has mildly decreased function. The left ventricle  demonstrates global hypokinesis. Unable to exclude regional wall motion  abnormality (challenging images). Left  ventricular diastolic parameters are indeterminate.   2. Right ventricular systolic function is normal. The right ventricular  size is normal.   3. The mitral valve was not well visualized. No evidence of mitral valve  regurgitation. No evidence of mitral stenosis. Moderate mitral annular  calcification.   4. The aortic valve was not well visualized. Aortic valve regurgitation  is not visualized. No aortic stenosis is present.   vascular 03/22/2019.   Had lower extremity arterial Dopplers  Bilateral lower extremity duplex showing biphasic waveforms.  RLE had biphasic/triphasic waveforms.  Noted mild PAD    Prior stress test 2018 No ischemia  Echocardiogram 2018, normal ejection fraction 55 to 60%  Hemoglobin A1c has improved over the last several years  EKG personally reviewed by myself on todays visit EKG Interpretation Date/Time:  Tuesday March 15 2023 11:46:40 EST Ventricular Rate:  83 PR Interval:  210 QRS Duration:  146 QT Interval:  446 QTC Calculation: 524 R Axis:   -57  Text Interpretation: Sinus rhythm with sinus arrhythmia with 1st degree A-V block Right bundle branch block Left anterior fascicular block Bifascicular block When compared with ECG of 18-Dec-2020 10:20, No significant change was found Confirmed by Andrea [name] [phone]) on 03/15/2023 11:50:26 AM  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    Depression    Diabetes mellitus type 2, uncomplicated (HCC)    Diabetic retinopathy (HCC)    Dupuytren contracture 2011   s/p surgery to LEFT hand   Frequent urinary tract infections    GERD (gastroesophageal reflux disease)    Grade I diastolic dysfunction    HTN (hypertension)    Hyperlipidemia, unspecified    Irritable bowel syndrome    Meningitis 11/2019   MI (myocardial infarction) (HCC) 1994   no stent   MRSA (methicillin resistant staph aureus) culture positive    Neuromuscular disorder (HCC)    nerve pain in back and legs   Osteoporosis    Pneumonia 2021   RBBB    Sepsis (HCC) 11/2019   Skin cancer    TIA (transient ischemic attack) 2014   Vitamin D  deficiency, unspecified    Wheezing    Past Surgical History:  Procedure Laterality Date   BACK SURGERY     lower due to spinal stenosis   BONE BIOPSY Left 11/20/2020   Procedure: BONE BIOPSY;  Surgeon: Janit Thresa HERO, DPM;  Location: [facility];  Service: Podiatry;  Laterality: Left;   CARDIAC CATHETERIZATION  1994   CATARACT EXTRACTION, BILATERAL Bilateral    COLONOSCOPY     x3   COLONOSCOPY WITH PROPOFOL  N/A 02/08/2019   Procedure: COLONOSCOPY WITH PROPOFOL ;  Surgeon: Therisa Bi, MD;  Location: [facility];  Service: Gastroenterology;  Laterality: N/A;   CORONARY ANGIOPLASTY  1994   DUPUYTREN CONTRACTURE RELEASE Left 2011   HARDWARE REMOVAL Left 08/19/2020   Procedure: HARDWARE REMOVAL;  Surgeon: Kathlynn Sharper, MD;  Location: [facility];  Service: Orthopedics;  Laterality: Left;   HIP FRACTURE SURGERY  11/2013   IRRIGATION AND DEBRIDEMENT FOOT Left 11/20/2020   Procedure: IRRIGATION AND DEBRIDEMENT FOOT-Heel Ulcer;  Surgeon: Janit Thresa HERO, DPM;  Location: [facility];  Service: Podiatry;  Laterality: Left;   PR COLSC FLX W/REMOVAL LESION BY HOT BX FORCEPS   08/21/2015   Procedure: COLONOSCOPY, FLEXIBLE, PROXIMAL TO SPLENIC FLEXURE; W/REMOVAL TUMOR/POLYP/OTHER LESION, HOT BX FORCEP/CAUTE; Surgeon: Del Melanee Blew, MD; Location: [facility]; Service: General Surgery   REPAIR DURAL / CSF LEAK     to left sinus   TEE WITHOUT CARDIOVERSION N/A 11/20/2019   Procedure: TRANSESOPHAGEAL ECHOCARDIOGRAM (TEE);  Surgeon: Mady Bruckner, MD;  Location: [facility];  Service: Cardiovascular;  Laterality: N/A;   TOTAL HIP ARTHROPLASTY Left 08/19/2020   Procedure: TOTAL HIP ARTHROPLASTY ANTERIOR APPROACH;  Surgeon: Kathlynn Sharper, MD;  Location: [facility];  Service: Orthopedics;  Laterality: Left;     Current Meds  Medication Sig   Alcohol  Swabs (ALCOHOL  PADS) 70 % PADS 1 each by Does not apply route in the morning and at bedtime.   alendronate  (FOSAMAX ) 70 MG tablet Take 1 tablet (70 mg total) by mouth every 7 (seven) days. Take with a full glass of water on an empty stomach.   atorvastatin  (LIPITOR ) 20 MG tablet Take 1 tablet (20 mg total) by mouth daily.   Calcium  Carbonate-Vitamin D  600-400 MG-UNIT tablet Take 1 tablet by mouth daily.   Cholecalciferol  25 MCG (1000 UT) capsule Take 1,000 Units  by mouth daily.   FARXIGA  10 MG TABS tablet TAKE 1 TABLET BY MOUTH DAILY BEFORE BREAKFAST   furosemide  (LASIX ) 20 MG tablet TAKE 1 TABLET BY MOUTH ONCE DAILY   insulin  degludec (TRESIBA  FLEXTOUCH) 100 UNIT/ML FlexTouch Pen Inject 28 Units into the skin daily. Patient receives via [employer] Patient Assistance through December 2024   Insulin  Pen Needle (TRUEPLUS PEN NEEDLES) 31G X 6 MM MISC Use with lantus  two times daily.   losartan  (COZAAR ) 25 MG tablet TAKE 1 TABLET BY MOUTH ONCE DAILY   metoprolol  succinate (TOPROL -XL) 50 MG 24 hr tablet Take 1 tablet (50 mg total) by mouth daily.   montelukast  (SINGULAIR ) 10 MG tablet TAKE ONE TABLET BY MOUTH EVERYDAY AT BEDTIME   Multiple Vitamins-Minerals (PRESERVISION AREDS 2 PO) Take 1 tablet by mouth in the morning and at bedtime.   Omega-3 1000 MG CAPS Take 1,000 mg by mouth daily.   omeprazole  (PRILOSEC) 40 MG capsule TAKE 1 CAPSULE BY MOUTH EVERY MORNING *REFILL  REQUEST*   OneTouch Delica Lancets 33G MISC To check blood sugar three times daily  DX: E11.42   ONETOUCH VERIO test strip Use to check blood sugar three times daily. DX E11.42   oxybutynin  (DITROPAN -XL) 10 MG 24 hr tablet TAKE 1 TABLET BY MOUTH EVERY DAY AT BEDTIME   Semaglutide ,0.25 or 0.5MG /DOS, (OZEMPIC , 0.25 OR 0.5 MG/DOSE,) 2 MG/3ML SOPN Inject 0.5 mg into the skin once a week. Patient receives via [employer] Patient Assistance through December 2024   sertraline  (ZOLOFT ) 25 MG tablet TAKE 1 TABLET BY MOUTH ONCE DAILY     Allergies:   Chlorhexidine , Metformin and related, and Sulfa antibiotics   Social History   Tobacco Use   Smoking status: Every Day    Current packs/day: 0.00    Average packs/day: 0.5 packs/day for 30.0 years (15.0 ttl pk-yrs)    Types: Cigarettes    Start date: 07/18/1982    Last attempt to quit: 07/17/2012    Years since quitting: 10.6   Smokeless tobacco: Never   Tobacco comments:     Currently smoking a pack every three days  Vaping Use   Vaping status: Never Used  Substance Use Topics   Alcohol  use: Not Currently    Alcohol /week: 0.0 standard drinks of alcohol    Drug use: No      Family Hx: The patient's family history includes AAA (abdominal aortic aneurysm) in her daughter; Anxiety disorder in her daughter; Arthritis in her daughter; Asthma in her brother; Breast cancer in her cousin; Cataracts in her mother; Depression in her daughter and mother; Diabetes in her daughter; Heart disease in her mother; Hyperlipidemia in her daughter and daughter; Hypertension in her daughter and mother; Plantar fasciitis in her daughter; Stroke in her mother.  ROS:   Please see the history of present illness.    Review of Systems  Constitutional: Negative.   HENT: Negative.    Respiratory: Negative.    Cardiovascular: Negative.   Gastrointestinal: Negative.   Musculoskeletal: Negative.        Gait instability  Neurological: Negative.   Psychiatric/Behavioral:  Negative.    All other systems reviewed and are negative.    Labs/Other Tests and Data Reviewed:    Recent Labs: 12/30/2022: BUN 34; Creatinine, Ser 1.48; Hemoglobin 12.3; Platelets 186; Potassium 5.2; Sodium 142   Recent Lipid Panel Lab Results  Component Value Date/Time   CHOL 135 12/30/2022 10:07 AM   TRIG 141 12/30/2022 10:07 AM   HDL 69 12/30/2022  10:07 AM   CHOLHDL 2.0 12/30/2022 10:07 AM   CHOLHDL 2.1 12/16/2016 03:39 PM   LDLCALC 42 12/30/2022 10:07 AM   LDLCALC 56 12/16/2016 03:39 PM    Wt Readings from Last 3 Encounters:  03/15/23 134 lb 4 oz (60.9 kg)  12/23/22 135 lb 14.4 oz (61.6 kg)  10/20/22 138 lb (62.6 kg)     Exam:    Vital Signs: Vital signs may also be detailed in the HPI BP (!) 122/52 (BP Location: Left Arm, Patient Position: Sitting, Cuff Size: Normal)   Pulse 83   Ht 4' 9 (1.448 m)   Wt 134 lb 4 oz (60.9 kg)   SpO2 94%   BMI 29.05 kg/m   Constitutional:  oriented to person, place, and time. No distress.  HENT:  Head: Grossly normal Eyes:  no discharge. No scleral icterus.  Neck: No JVD, no carotid bruits  Cardiovascular: Regular rate and rhythm, no murmurs appreciated Pulmonary/Chest: Clear to auscultation bilaterally, no wheezes or rails Abdominal: Soft.  no distension.  no tenderness.  Musculoskeletal: Normal range of motion Neurological:  normal muscle tone. Coordination normal. No atrophy Skin: Skin warm and dry Psychiatric: normal affect, pleasant   ASSESSMENT & PLAN:    Problem List Items Addressed This Visit       Cardiology Problems   Hyperlipidemia LDL goal <70   Essential hypertension   Relevant Orders   EKG 12-Lead (Completed)   Atherosclerotic heart disease of native coronary artery with other forms of angina pectoris (HCC)   Relevant Orders   EKG 12-Lead (Completed)   PAD (peripheral artery disease) (HCC) - Primary   Relevant Orders   EKG 12-Lead (Completed)   Other Visit Diagnoses       Chronic systolic heart  failure (HCC)       Relevant Orders   EKG 12-Lead (Completed)     HFrEF (heart failure with reduced ejection fraction) (HCC)       Relevant Orders   EKG 12-Lead (Completed)     Poorly controlled type 2 diabetes mellitus (HCC)           HTN: Blood pressure is well controlled on today's visit. No changes made to the medications.  Cad stable angina Currently with no symptoms of angina. No further workup at this time. Continue current medication regimen.  Diabetes, HBA1C 7.3 Numbers have been trending down over the past 2 years  Osteomyelitis Made full recovery  Hyperlipidemia Cholesterol is at goal on the current lipid regimen. No changes to the medications were made.  Cardiomyopathy Ejection fraction 40 to 45% in 2023 Appears euvolemic With climbing renal function recommended she hold diuretic/Lasix  on weekends Tolerating Farxiga , metoprolol  succinate, losartan    Signed, Evalene Lunger, MD  [facility] [address], [place], [place] [address]

## 2023-03-14 ENCOUNTER — Other Ambulatory Visit: Payer: Self-pay

## 2023-03-14 NOTE — Patient Outreach (Signed)
  Care Management   Visit Note  03/14/2023 Name: Andrea [name] MRN: [id number] DOB: 1945-06-13  Subjective: Andrea [name] is a 78 y.o. year old female who is a primary care patient of Andrea Kelly DASEN, FNP. The Care Management team was consulted for assistance.      Engaged with Andrea [name] via telephone.  Assessment:  Review of patient past medical history, allergies, medications, health status, including review of consultants reports, laboratory and other test data, was performed as part of  evaluation and provision of care management services.    Outpatient Encounter Medications as of 03/14/2023  Medication Sig Note   Alcohol  Swabs (ALCOHOL  PADS) 70 % PADS 1 each by Does not apply route in the morning and at bedtime.    alendronate  (FOSAMAX ) 70 MG tablet Take 1 tablet (70 mg total) by mouth every 7 (seven) days. Take with a full glass of water on an empty stomach.    atorvastatin  (LIPITOR ) 20 MG tablet Take 1 tablet (20 mg total) by mouth daily.    Calcium  Carbonate-Vitamin D  600-400 MG-UNIT tablet Take 1 tablet by mouth daily.    Cholecalciferol  25 MCG (1000 UT) capsule Take 1,000 Units by mouth daily.    Cyanocobalamin  (B-12) 1000 MCG CAPS Take 1,000 mcg by mouth daily. (Patient not taking: Reported on 01/12/2023)    FARXIGA  10 MG TABS tablet TAKE 1 TABLET BY MOUTH DAILY BEFORE BREAKFAST    furosemide  (LASIX ) 20 MG tablet TAKE 1 TABLET BY MOUTH ONCE DAILY    insulin  degludec (TRESIBA  FLEXTOUCH) 100 UNIT/ML FlexTouch Pen Inject 28 Units into the skin daily. Patient receives via [employer] Patient Assistance through December 2024    Insulin  Pen Needle (TRUEPLUS PEN NEEDLES) 31G X 6 MM MISC Use with lantus  two times daily.    losartan  (COZAAR ) 25 MG tablet TAKE 1 TABLET BY MOUTH ONCE DAILY    metoprolol  succinate (TOPROL -XL) 50 MG 24 hr tablet Take 1 tablet (50 mg total) by mouth daily.    montelukast  (SINGULAIR ) 10 MG tablet TAKE ONE TABLET BY MOUTH EVERYDAY AT BEDTIME    Multiple  Vitamins-Minerals (PRESERVISION AREDS 2 PO) Take 1 tablet by mouth in the morning and at bedtime.    Omega-3 1000 MG CAPS Take 1,000 mg by mouth daily.    omeprazole  (PRILOSEC) 40 MG capsule TAKE 1 CAPSULE BY MOUTH EVERY MORNING *REFILL REQUEST*    OneTouch Delica Lancets 33G MISC To check blood sugar three times daily  DX: E11.42    ONETOUCH VERIO test strip Use to check blood sugar three times daily. DX E11.42    oxybutynin  (DITROPAN -XL) 10 MG 24 hr tablet TAKE 1 TABLET BY MOUTH EVERY DAY AT BEDTIME    Semaglutide ,0.25 or 0.5MG /DOS, (OZEMPIC , 0.25 OR 0.5 MG/DOSE,) 2 MG/3ML SOPN Inject 0.5 mg into the skin once a week. Patient receives via [employer] Patient Assistance through December 2024 12/14/2022: Reports taking the 0.5mg  dose   sertraline  (ZOLOFT ) 25 MG tablet TAKE 1 TABLET BY MOUTH ONCE DAILY    No facility-administered encounter medications on file as of 03/14/2023.

## 2023-03-15 ENCOUNTER — Ambulatory Visit: Payer: Medicare Other | Attending: Cardiovascular Disease | Admitting: Cardiovascular Disease

## 2023-03-15 ENCOUNTER — Encounter: Payer: Self-pay | Admitting: Cardiovascular Disease

## 2023-03-15 VITALS — BP 122/52 | HR 83 | Ht <= 58 in | Wt 134.2 lb

## 2023-03-15 DIAGNOSIS — I5022 Chronic systolic (congestive) heart failure: Secondary | ICD-10-CM

## 2023-03-15 DIAGNOSIS — E785 Hyperlipidemia, unspecified: Secondary | ICD-10-CM

## 2023-03-15 DIAGNOSIS — E1165 Type 2 diabetes mellitus with hyperglycemia: Secondary | ICD-10-CM | POA: Diagnosis not present

## 2023-03-15 DIAGNOSIS — I739 Peripheral vascular disease, unspecified: Secondary | ICD-10-CM

## 2023-03-15 DIAGNOSIS — I5020 Unspecified systolic (congestive) heart failure: Secondary | ICD-10-CM | POA: Diagnosis not present

## 2023-03-15 DIAGNOSIS — I10 Essential (primary) hypertension: Secondary | ICD-10-CM

## 2023-03-15 DIAGNOSIS — I25118 Atherosclerotic heart disease of native coronary artery with other forms of angina pectoris: Secondary | ICD-10-CM | POA: Diagnosis not present

## 2023-03-15 NOTE — Patient Instructions (Signed)
 Medication Instructions:  Hold the lasix  on the weekends, (hold lasix  2 days a week) Stay on it Monday through  Friday  If you need a refill on your cardiac medications before your next appointment, please call your pharmacy.   Lab work: No new labs needed  Testing/Procedures: No new testing needed  Follow-Up: At [facility], you and your health needs are our priority.  As part of our continuing mission to provide you with exceptional heart care, we have created designated Provider Care Teams.  These Care Teams include your primary Cardiologist (physician) and Advanced Practice Providers (APPs -  Physician Assistants and Nurse Practitioners) who all work together to provide you with the care you need, when you need it.  You will need a follow up appointment in 12 months  Providers on your designated Care Team:   Lonni Meager, NP Bernardino Bring, PA-C Cadence [place], [place]  COVID-19 Vaccine Information can be found at: [email or web address] For questions related to vaccine distribution or appointments, please email [email or web address] or call [phone].

## 2023-03-23 ENCOUNTER — Other Ambulatory Visit: Payer: Self-pay | Admitting: Pharmacist

## 2023-03-23 NOTE — Progress Notes (Signed)
   03/23/2023  Patient ID: Andrea [name], female   DOB: [birth year], 78 y.o.   MRN: [id number]   Receive a voicemail from patient requesting a call back. Return call to patient.   States that her pharmacy has mailed her Farxiga  10 mg, both in her pill pack and in a separate bottle.  Have patient review pill bottle during our call and confirm was shipped from [facility] (dispensing pharmacy for [employer] patient assistance program).   Patient states that now that she is receiving Farxiga  from [employer] patient assistance, will contact [facility] to have pharmacy stop sending/filling future refills through her insurance. Will update patient's medication list accordingly.  Arthur Lash, PharmD, [facility] [phone]

## 2023-04-07 NOTE — Telephone Encounter (Signed)
PAP: Application for Ozempic and Evaristo Bury has been submitted to [employer], via fax   PAP: Application for Marcelline Deist has been submitted to [employer] ([employer]), via fax   PLEASE BE ADVISED

## 2023-04-20 ENCOUNTER — Ambulatory Visit: Payer: Self-pay | Admitting: *Deleted

## 2023-04-20 NOTE — Telephone Encounter (Signed)
Patient's daughter: Ander Slade calling- not with patient Chief Complaint: cough, congestion Symptoms: cough/congestion- soreness in chest, fatigue, wheezing Frequency: symptoms started Fri/Sat Pertinent Negatives: Patient denies fever Disposition: [] ED /[] Urgent Care (no appt availability in office) / [x] Appointment(In office/virtual)/ []  [place] [facility]/ [] Home Care/ [] Refused Recommended Disposition /[] [facility]/ []  Follow-up with PCP Additional Notes: Patient's daughter states her [facility]- appointment made for early am with strict instruction- if she she get worse have her seen tonight.    Reason for Disposition  [1] Continuous (nonstop) coughing interferes with work or school AND [2] no improvement using cough treatment per Care Advice  Answer Assessment - Initial Assessment Questions 1. ONSET: "When did the cough begin?"      Started Friday/Saturday 2. SEVERITY: "How bad is the cough today?"      Causing her to be sore 3. SPUTUM: "Describe the color of your sputum" (none, dry cough; clear, white, yellow, green)     unknown 4. HEMOPTYSIS: "Are you coughing up any blood?" If so ask: "How much?" (flecks, streaks, tablespoons, etc.)     unknown 5. DIFFICULTY BREATHING: "Are you having difficulty breathing?" If Yes, ask: "How bad is it?" (e.g., mild, moderate, severe)    - MILD: No SOB at rest, mild SOB with walking, speaks normally in sentences, can lie down, no retractions, pulse < 100.    - MODERATE: SOB at rest, SOB with minimal exertion and prefers to sit, cannot lie down flat, speaks in phrases, mild retractions, audible wheezing, pulse 100-120.    - SEVERE: Very SOB at rest, speaks in single words, struggling to breathe, sitting hunched forward, retractions, pulse > 120      Coughing causes SOB 6. FEVER: "Do you have a fever?" If Yes, ask: "What is your temperature, how was it measured, and when did it start?"     no 7. CARDIAC HISTORY: "Do you have  any history of heart disease?" (e.g., heart attack, congestive heart failure)      Hx heart attack 8. LUNG HISTORY: "Do you have any history of lung disease?"  (e.g., pulmonary embolus, asthma, emphysema)     Hx pneumonia 9. PE RISK FACTORS: "Do you have a history of blood clots?" (or: recent major surgery, recent prolonged travel, bedridden)     no 10. OTHER SYMPTOMS: "Do you have any other symptoms?" (e.g., runny nose, wheezing, chest pain)       Chest pain, congestion, wheezing,   12. TRAVEL: "Have you traveled out of the country in the last month?" (e.g., travel history, exposures)       Husband has been sick  Protocols used: Cough - Acute Productive-A-AH

## 2023-04-21 ENCOUNTER — Encounter: Payer: Self-pay | Admitting: Family Medicine

## 2023-04-21 ENCOUNTER — Ambulatory Visit
Admission: RE | Admit: 2023-04-21 | Discharge: 2023-04-21 | Disposition: A | Discharge status: Home / Self Care | Payer: Medicare Other | Attending: Family Medicine | Admitting: Family Medicine

## 2023-04-21 ENCOUNTER — Ambulatory Visit (INDEPENDENT_AMBULATORY_CARE_PROVIDER_SITE_OTHER): Payer: Medicare Other | Admitting: Family Medicine

## 2023-04-21 ENCOUNTER — Ambulatory Visit
Admission: RE | Admit: 2023-04-21 | Discharge: 2023-04-21 | Disposition: A | Discharge status: Home / Self Care | Payer: Medicare Other | Source: Ambulatory Visit | Attending: Family Medicine

## 2023-04-21 VITALS — BP 118/68 | HR 65 | Temp 97.7°F | Resp 24 | Ht <= 58 in | Wt 134.0 lb

## 2023-04-21 DIAGNOSIS — E785 Hyperlipidemia, unspecified: Secondary | ICD-10-CM

## 2023-04-21 DIAGNOSIS — R051 Acute cough: Secondary | ICD-10-CM | POA: Insufficient documentation

## 2023-04-21 DIAGNOSIS — R0781 Pleurodynia: Secondary | ICD-10-CM | POA: Insufficient documentation

## 2023-04-21 DIAGNOSIS — R062 Wheezing: Secondary | ICD-10-CM | POA: Insufficient documentation

## 2023-04-21 DIAGNOSIS — R0602 Shortness of breath: Secondary | ICD-10-CM | POA: Diagnosis not present

## 2023-04-21 DIAGNOSIS — I5022 Chronic systolic (congestive) heart failure: Secondary | ICD-10-CM | POA: Insufficient documentation

## 2023-04-21 DIAGNOSIS — Z8701 Personal history of pneumonia (recurrent): Secondary | ICD-10-CM | POA: Diagnosis present

## 2023-04-21 DIAGNOSIS — E1169 Type 2 diabetes mellitus with other specified complication: Secondary | ICD-10-CM

## 2023-04-21 MED ORDER — BENZONATATE 100 MG PO CAPS: 100.0000 mg | ORAL_CAPSULE | Freq: Three times a day (TID) | ORAL | 0 refills | Status: DC | PRN

## 2023-04-21 MED ORDER — PREDNISONE 20 MG PO TABS: ORAL_TABLET | ORAL | 0 refills | Status: DC

## 2023-04-21 MED ORDER — GUAIFENESIN ER 600 MG PO TB12: 600.0000 mg | ORAL_TABLET | Freq: Two times a day (BID) | ORAL | 1 refills | Status: DC | PRN

## 2023-04-21 MED ORDER — BENZONATATE 100 MG PO CAPS
100.0000 mg | ORAL_CAPSULE | Freq: Three times a day (TID) | ORAL | 0 refills | Status: DC | PRN
Start: 2023-04-21 — End: 2023-07-27

## 2023-04-21 MED ORDER — ALBUTEROL SULFATE HFA 108 (90 BASE) MCG/ACT IN AERS: 2.0000 | INHALATION_SPRAY | RESPIRATORY_TRACT | 1 refills | Status: DC | PRN

## 2023-04-21 MED ORDER — ALBUTEROL SULFATE HFA 108 (90 BASE) MCG/ACT IN AERS
2.0000 | INHALATION_SPRAY | RESPIRATORY_TRACT | 1 refills | Status: DC | PRN
Start: 2023-04-21 — End: 2024-01-26

## 2023-04-21 NOTE — Progress Notes (Signed)
Patient ID: Andrea [name], female    DOB: 12/14/1945, 78 y.o.   MRN: [id number]  PCP: Jacky Kindle, FNP  Chief Complaint  Patient presents with   Cough    X1 week, productive. OTC meds no help.   Wheezing    Subjective:   Andrea [name] is a 78 y.o. female, presents to clinic with CC of the following:  HPI  Here for cough and wheeze x 1 week Used dayquil and nyquil pushing juices   Hx of CHF and smoking No noted COPD in hx or problem list, no PFTs in EMR  DM  Lab Results  Component Value Date   HGBA1C 7.3 (H) 12/30/2022     Patient Active Problem List   Diagnosis Date Noted   Diabetes mellitus due to underlying condition with diabetic autonomic neuropathy (HCC) 12/23/2022   Colon cancer screening declined 12/23/2022   Tobacco dependence 12/23/2022   Flu vaccine need 12/23/2022   Skin tear of left elbow without complication 10/20/2022   Need for prophylactic vaccination against Streptococcus pneumoniae (pneumococcus) 09/23/2022   Elevated serum creatinine 09/23/2022   Avitaminosis D 09/23/2022   Age-related physical debility 06/10/2022   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 (HCC) 12/10/2021   Type 2 diabetes mellitus with diabetic polyneuropathy, without long-term current use of insulin (HCC) 12/10/2021   Gastroesophageal reflux disease without esophagitis 11/13/2021   Vitamin D deficiency 11/13/2021   Hypertension associated with diabetes (HCC) 09/03/2021   Hyperlipidemia LDL goal <70 04/28/2021   Essential hypertension    Chronic systolic CHF (congestive heart failure) (HCC) 12/18/2020   History of myocardial infarction 03/09/2019   PAD (peripheral artery disease) (HCC) 12/28/2018   Atherosclerotic heart disease of native coronary artery with other forms of angina pectoris (HCC) 01/06/2017   Drug-induced constipation 01/06/2017   Bundle branch block, 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   Personal history of transient ischemic attack (TIA), and cerebral infarction  without residual deficits 11/23/2012   Status post percutaneous transluminal coronary angioplasty 11/23/2012   OP (osteoporosis) 08/17/2012   Type 2 diabetes mellitus with hyperlipidemia (HCC) 08/17/2012   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 Take with a full glass of water on an empty stomach., Disp: 4 tablet, Rfl: 11   atorvastatin (LIPITOR) 20 MG tablet, Take 1 tablet (20 mg total) by mouth daily., Disp: 90 tablet, Rfl: 1   Calcium Carbonate-Vitamin D 600-400 MG-UNIT tablet, Take 1 tablet by mouth daily., Disp: , Rfl:    Cholecalciferol 25 MCG (1000 UT) capsule, Take 1,000 Units by mouth daily., Disp: , Rfl:    dapagliflozin propanediol (FARXIGA) 10 MG TABS tablet, Take 10 mg by mouth daily. Receives through [place] patient assistance program, Disp: , Rfl:    furosemide (LASIX) 20 MG tablet, TAKE 1 TABLET BY MOUTH ONCE DAILY, Disp: 90 tablet, Rfl: 1   insulin degludec (TRESIBA FLEXTOUCH) 100 UNIT/ML FlexTouch Pen, Inject 28 Units into the skin daily. Patient receives via [employer] Patient Assistance through December 2024, Disp: , Rfl:    Insulin Pen Needle (TRUEPLUS PEN NEEDLES) 31G X 6 MM MISC, Use with lantus two times daily., Disp: 100 each, Rfl: 3  losartan (COZAAR) 25 MG tablet, TAKE 1 TABLET BY MOUTH ONCE DAILY, Disp: 30 tablet, Rfl: 10   metoprolol succinate (TOPROL-XL) 50 MG 24 hr tablet, Take 1 tablet (50 mg total) by mouth daily., Disp: 90 tablet, Rfl: 3   montelukast (SINGULAIR) 10 MG tablet, TAKE ONE TABLET BY MOUTH EVERYDAY AT BEDTIME, Disp: 90 tablet, Rfl: 3   Multiple Vitamins-Minerals (PRESERVISION AREDS 2 PO), Take 1 tablet by mouth in the morning and at bedtime., Disp: , Rfl:     Omega-3 1000 MG CAPS, Take 1,000 mg by mouth daily., Disp: , Rfl:    omeprazole (PRILOSEC) 40 MG capsule, TAKE 1 CAPSULE BY MOUTH EVERY MORNING *REFILL REQUEST*, Disp: 90 capsule, Rfl: 1   OneTouch Delica Lancets 33G MISC, To check blood sugar three times daily  DX: E11.42, Disp: 300 each, Rfl: 11   ONETOUCH VERIO test strip, Use to check blood sugar three times daily. DX E11.42, Disp: 300 strip, Rfl: 11   oxybutynin (DITROPAN-XL) 10 MG 24 hr tablet, TAKE 1 TABLET BY MOUTH EVERY DAY AT BEDTIME, Disp: 90 tablet, Rfl: 1   Semaglutide,0.25 or 0.5MG /DOS, (OZEMPIC, 0.25 OR 0.5 MG/DOSE,) 2 MG/3ML SOPN, Inject 0.5 mg into the skin once a week. Patient receives via [employer] Patient Assistance through December 2024, Disp: 3 mL, Rfl:    sertraline (ZOLOFT) 25 MG tablet, TAKE 1 TABLET BY MOUTH ONCE DAILY, Disp: 30 tablet, Rfl: 10   Cyanocobalamin (B-12) 1000 MCG CAPS, Take 1,000 mcg by mouth daily. (Patient not taking: Reported on 01/12/2023), Disp: , Rfl:    Allergies  Allergen Reactions   Chlorhexidine    Metformin And Related Diarrhea   Sulfa Antibiotics Rash     Social History   Tobacco Use   Smoking status: Every Day    Current packs/day: 0.00    Average packs/day: 0.5 packs/day for 30.0 years (15.0 ttl pk-yrs)    Types: Cigarettes    Start date: 07/18/1982    Last attempt to quit: 07/17/2012    Years since quitting: 10.7   Smokeless tobacco: Never   Tobacco comments:     Currently smoking a pack every three days  Vaping Use   Vaping status: Never Used  Substance Use Topics   Alcohol use: Not Currently    Alcohol/week: 0.0 standard drinks of alcohol   Drug use: No      Chart Review Today: I personally reviewed active problem list, medication list, allergies, family history, social history, health maintenance, notes from last encounter, lab results, imaging with the patient/caregiver today.   Review of Systems  Constitutional:  Negative for activity change, appetite change,  chills, diaphoresis, fatigue and fever.  HENT: Negative.    Eyes: Negative.   Respiratory:  Positive for cough, chest tightness, shortness of breath and wheezing.   Cardiovascular: Negative.  Negative for chest pain, palpitations and leg swelling.  Gastrointestinal: Negative.   Endocrine: Negative.   Genitourinary: Negative.   Musculoskeletal: Negative.   Skin: Negative.   Allergic/Immunologic: Negative.   Neurological: Negative.   Hematological: Negative.   Psychiatric/Behavioral: Negative.    All other systems reviewed and are negative.      Objective:   Vitals:   04/21/23 0903  BP: 118/68  Pulse: 65  Resp: 16  Temp: 97.7 F (36.5 C)  SpO2: 95%  Weight: 134 lb (60.8 kg)  Height: 4\' 9"  (1.448 m)    Body mass index is 29 kg/m.  Physical Exam Vitals and nursing note reviewed.  Constitutional:  General: She is not in acute distress.    Appearance: Normal appearance. She is well-developed. She is not ill-appearing, toxic-appearing or diaphoretic.     Comments: Elderly female, NAD, non-toxic appearing  HENT:     Head: Normocephalic and atraumatic.     Right Ear: External ear normal.     Left Ear: External ear normal.     Nose: Nose normal. No congestion or rhinorrhea.     Mouth/Throat:     Mouth: Mucous membranes are moist.     Pharynx: Oropharynx is clear. No oropharyngeal exudate or posterior oropharyngeal erythema.  Eyes:     General:        Right eye: No discharge.        Left eye: No discharge.     Conjunctiva/sclera: Conjunctivae normal.  Neck:     Trachea: No tracheal deviation.  Cardiovascular:     Rate and Rhythm: Normal rate and regular rhythm.     Pulses: Normal pulses.     Heart sounds: No murmur heard.    No gallop.  Pulmonary:     Effort: Tachypnea (mild, counted RR 24) present. No accessory muscle usage, respiratory distress or retractions.     Breath sounds: Decreased air movement present. No stridor. Examination of the right-lower field  reveals decreased breath sounds. Examination of the left-lower field reveals decreased breath sounds. Decreased breath sounds, wheezing and rhonchi present.  Musculoskeletal:     Right lower leg: No edema.     Left lower leg: No edema.  Skin:    General: Skin is warm and dry.     Capillary Refill: Capillary refill takes less than 2 seconds.     Findings: No rash.  Neurological:     Mental Status: She is alert.     Cranial Nerves: No facial asymmetry.     Motor: Motor function is intact. No abnormal muscle tone.     Coordination: Coordination is intact.     Comments: In [facility] for duration of exam  Psychiatric:        Mood and Affect: Mood normal.        Behavior: Behavior normal.      Results for orders placed or performed in visit on 12/23/22  Lipid panel   Collection Time: 12/30/22 10:07 AM  Result Value Ref Range   Cholesterol, Total 135 100 - 199 mg/dL   Triglycerides 161 0 - 149 mg/dL   HDL 69 >09 mg/dL   VLDL Cholesterol Cal 24 5 - 40 mg/dL   LDL Chol Calc (NIH) 42 0 - 99 mg/dL   Chol/HDL Ratio 2.0 0.0 - 4.4 ratio  CBC   Collection Time: 12/30/22 10:07 AM  Result Value Ref Range   WBC 7.9 3.4 - 10.8 x10E3/uL   RBC 4.23 3.77 - 5.28 x10E6/uL   Hemoglobin 12.3 11.1 - 15.9 g/dL   Hematocrit 60.4 54.0 - 46.6 %   MCV 93 79 - 97 fL   MCH 29.1 26.6 - 33.0 pg   MCHC 31.1 (L) 31.5 - 35.7 g/dL   RDW 98.1 19.1 - 47.8 %   Platelets 186 150 - 450 x10E3/uL  Hemoglobin A1c   Collection Time: 12/30/22 10:07 AM  Result Value Ref Range   Hgb A1c MFr Bld 7.3 (H) 4.8 - 5.6 %   Est. average glucose Bld gHb Est-mCnc 163 mg/dL  Urine Microalbumin w/creat. ratio   Collection Time: 12/30/22 10:07 AM  Result Value Ref Range   Creatinine, Urine 38.4 Not Estab. mg/dL  Microalbumin, Urine 20.9 Not Estab. ug/mL   Microalb/Creat Ratio 54 (H) 0 - 29 mg/g creat  Basic Metabolic Panel (BMET)   Collection Time: 12/30/22 10:07 AM  Result Value Ref Range   Glucose 155 (H) 70 - 99 mg/dL   BUN 34  (H) 8 - 27 mg/dL   Creatinine, Ser 1.61 (H) 0.57 - 1.00 mg/dL   eGFR 36 (L) >09 UE/AVW/0.98   BUN/Creatinine Ratio 23 12 - 28   Sodium 142 134 - 144 mmol/L   Potassium 5.2 3.5 - 5.2 mmol/L   Chloride 103 96 - 106 mmol/L   CO2 25 20 - 29 mmol/L   Calcium 9.5 8.7 - 10.3 mg/dL       Assessment & Plan:   Pt with one week of URI sx which has for the past two days been worse in her chest with painful cough, shortness of breath even when at rest, feeling tight and wheezy.  Sig smoking hx, hx of pneumonia, CHF - today appears euvolemic no LE edema or weight changes She is here with her daughter who says she had a very difficult time with ambulating out of home and into wheel chair, even getting out of car to [facility] and into clinic RR rate 24 but pt does not appear distressed, speaking in short sentences, very splinted inspiratory effort, which with deeper breathing causing some wet sounding cough and is visibly uncomfortable Bilateral LL very congested with rhonchi and wheeze, possibly some atelectasis, CXR ordered stat After coughing some rhonchi cleared and LL sounded more coarse She has not had fever, sweats or chills Husband at home sick last 1-2 weeks so likely started with viral URI  Will tx wheeze and bronchitis aggressively with inhalers steroids mucinex and CXR to determine/r/o CAP and need for abx  Pulm toilet discussed and advised Return and ER/911 precautions also reviewed today and length and pt and daughter verbalized understanding  1. Acute cough (Primary)  - DG Chest 2 View - benzonatate (TESSALON) 100 MG capsule; Take 1 capsule (100 mg total) by mouth 3 (three) times daily as needed for cough.  Dispense: 30 capsule; Refill: 0 - guaiFENesin (MUCINEX) 600 MG 12 hr tablet; Take 1 tablet (600 mg total) by mouth 2 (two) times daily as needed.  Dispense: 60 tablet; Refill: 1  2. Wheeze Encouraged her to use inhaler more frequently the first couple days, explained how to use, MOA,  duration of meds - DG Chest 2 View - albuterol (VENTOLIN HFA) 108 (90 Base) MCG/ACT inhaler; Inhale 2 puffs into the lungs every 4 (four) hours as needed for wheezing or shortness of breath.  Dispense: 18 g; Refill: 1 - predniSONE (DELTASONE) 20 MG tablet; 2 tabs poqday 1-3, 1 tabs poqday 4-6  Dispense: 9 tablet; Refill: 0  3. Shortness of breath At rest and with exertion - DG Chest 2 View  4. Pleuritic chest pain With deep breathing and coughing - DG Chest 2 View  5. Chronic systolic CHF (congestive heart failure) (HCC) Pt seems euvolemic, CXR for further eval - DG Chest 2 View  6. Type 2 diabetes mellitus with hyperlipidemia (HCC) With steroids pt sugars will increased we discussed ways to manage that and when to be concerned and f/up (many days ~300 or higher) cut out juices  7. History of pneumonia  - DG Chest 2 View   Stat CXR results pt has asked [place] to call: [phone] - daughter Heloise Purpura, Cordelia Poche 04/21/23  9:11 AM

## 2023-04-21 NOTE — Telephone Encounter (Signed)
Patient has been seen.

## 2023-04-25 NOTE — Telephone Encounter (Signed)
PAP: Patient assistance application for Marcelline Deist has been approved by PAP Companies: [place] from 02/22/2023 to 03/07/2024. Medication should be delivered to PAP Delivery: Home. For further shipping updates, please contact [employer] ([employer]) at [phone]. Patient ID is: [id number]   PAP: Patient assistance application for NOVOFINE TIPS, Ozempic, and Evaristo Bury has been approved by PAP Companies: [employer] from 02/23/2023 to 03/07/2024. Medication should be delivered to PAP Delivery: Provider's office. For further shipping updates, please [employer] at [phone]. Patient ID is: NO ID    Georga B[facility] Patient Advocate [phone]

## 2023-04-26 ENCOUNTER — Other Ambulatory Visit: Payer: Self-pay | Admitting: Family Medicine

## 2023-04-26 DIAGNOSIS — K219 Gastro-esophageal reflux disease without esophagitis: Secondary | ICD-10-CM

## 2023-04-26 NOTE — Telephone Encounter (Signed)
Medication Refill -  Most Recent Primary Care Visit:  Provider: [name] T  Department: [id number]  Visit Type: OFFICE VISIT  Date: 12/23/2022  Medication: omeprazole (PRILOSEC) 40 MG capsule   Has the patient contacted their pharmacy? Yes  (Agent: If yes, when and what did the pharmacy advise?) Pharmacy Requesting refill  Is this the correct pharmacy for this prescription? Yes  This is the patient's preferred pharmacy:  [facility], [place] - [address] [place] [address] Phone: [phone] Fax: [phone]    Has the prescription been filled recently? Yes  Is the patient out of the medication? Yes  Has the patient been seen for an appointment in the last year OR does the patient have an upcoming appointment? Yes  Can we respond through MyChart? Yes  Agent: Please be advised that Rx refills may take up to 3 business days. We ask that you follow-up with your pharmacy.

## 2023-04-27 NOTE — Telephone Encounter (Signed)
Requested medication (s) are due for refill today - yes  Requested medication (s) are on the active medication list -yes  Future visit scheduled -no  Last refill: 11/04/22 #90 1Rf  Notes to clinic: PCP no longer at practice- not established with another provider  Requested Prescriptions  Pending Prescriptions Disp Refills   omeprazole (PRILOSEC) 40 MG capsule 90 capsule 1     Gastroenterology: Proton Pump Inhibitors Passed - 04/27/2023  2:09 PM      Passed - Valid encounter within last 12 months    Recent Outpatient Visits           4 months ago Diabetes mellitus due to underlying condition with diabetic autonomic neuropathy, unspecified whether long term insulin use (HCC)   [place] [facility] [name] T, FNP   6 months ago Skin tear of left elbow without complication, sequela   [place] [facility] [name] T, FNP   7 months ago Type 2 diabetes mellitus with diabetic polyneuropathy, without long-term current use of insulin [facility])   [place] [facility] [name] T, FNP   10 months ago Type 2 diabetes mellitus with diabetic polyneuropathy, without long-term current use of insulin [facility])   [place] [facility] [name] T, FNP   1 year ago Type 2 diabetes mellitus with diabetic polyneuropathy, without long-term current use of insulin [facility])   [place] [facility] [name] T, [place]                 Requested Prescriptions  Pending Prescriptions Disp Refills   omeprazole (PRILOSEC) 40 MG capsule 90 capsule 1     Gastroenterology: Proton Pump Inhibitors Passed - 04/27/2023  2:09 PM      Passed - Valid encounter within last 12 months    Recent Outpatient Visits           4 months ago Diabetes mellitus due to underlying condition with diabetic autonomic neuropathy, unspecified whether long term insulin use [facility]   [place] [facility] [name] T, FNP    6 months ago Skin tear of left elbow without complication, sequela   [facility] [name] T, FNP   7 months ago Type 2 diabetes mellitus with diabetic polyneuropathy, without long-term current use of insulin [facility])   [place] [facility] [name] T, FNP   10 months ago Type 2 diabetes mellitus with diabetic polyneuropathy, without long-term current use of insulin [facility])   [place] [facility] [name] T, FNP   1 year ago Type 2 diabetes mellitus with diabetic polyneuropathy, without long-term current use of insulin [facility])   [facility] Jacky Kindle, [place]

## 2023-04-29 ENCOUNTER — Telehealth: Payer: Self-pay

## 2023-04-29 NOTE — Telephone Encounter (Signed)
Copied from CRM [id number]. Topic: Clinical - Medication Question >> Apr 29, 2023 10:07 AM [name] wrote: Reason for CRM: needs something called in for yeast infection, [phone]

## 2023-05-02 ENCOUNTER — Telehealth: Payer: Self-pay

## 2023-05-02 NOTE — Telephone Encounter (Signed)
 Will she need appt?

## 2023-05-02 NOTE — Telephone Encounter (Signed)
 Copied from CRM [id number]. Topic: Clinical - Medication Question >> May 02, 2023 10:07 AM [name] wrote: Reason for CRM: having symptoms of vaginal yeast infection, itching and pain, frequency- need something for the symptoms - [phone]

## 2023-05-02 NOTE — Telephone Encounter (Signed)
 Recommend an appt with next available provider

## 2023-05-02 NOTE — Telephone Encounter (Signed)
 Called pt states will call back to schedule an appt

## 2023-05-03 ENCOUNTER — Other Ambulatory Visit: Payer: Self-pay

## 2023-05-03 ENCOUNTER — Ambulatory Visit (INDEPENDENT_AMBULATORY_CARE_PROVIDER_SITE_OTHER): Payer: Medicare Other | Admitting: Physician Assistant

## 2023-05-03 ENCOUNTER — Encounter: Payer: Self-pay | Admitting: Physician Assistant

## 2023-05-03 ENCOUNTER — Other Ambulatory Visit (HOSPITAL_COMMUNITY)
Admission: RE | Admit: 2023-05-03 | Discharge: 2023-05-03 | Disposition: A | Discharge status: Home / Self Care | Payer: Medicare Other | Source: Ambulatory Visit | Attending: Physician Assistant | Admitting: Physician Assistant

## 2023-05-03 ENCOUNTER — Telehealth: Payer: Self-pay | Admitting: Physician Assistant

## 2023-05-03 VITALS — BP 122/67 | HR 96 | Ht <= 58 in | Wt 134.5 lb

## 2023-05-03 DIAGNOSIS — N898 Other specified noninflammatory disorders of vagina: Secondary | ICD-10-CM | POA: Diagnosis present

## 2023-05-03 DIAGNOSIS — J328 Other chronic sinusitis: Secondary | ICD-10-CM | POA: Diagnosis not present

## 2023-05-03 DIAGNOSIS — R3915 Urgency of urination: Secondary | ICD-10-CM | POA: Insufficient documentation

## 2023-05-03 DIAGNOSIS — E0843 Diabetes mellitus due to underlying condition with diabetic autonomic (poly)neuropathy: Secondary | ICD-10-CM

## 2023-05-03 DIAGNOSIS — F17200 Nicotine dependence, unspecified, uncomplicated: Secondary | ICD-10-CM

## 2023-05-03 DIAGNOSIS — N3001 Acute cystitis with hematuria: Secondary | ICD-10-CM | POA: Insufficient documentation

## 2023-05-03 DIAGNOSIS — K219 Gastro-esophageal reflux disease without esophagitis: Secondary | ICD-10-CM

## 2023-05-03 LAB — POCT URINALYSIS DIPSTICK
Bilirubin, UA: POSITIVE
Glucose, UA: POSITIVE — AB
Ketones, UA: NEGATIVE
Nitrite, UA: POSITIVE
Protein, UA: NEGATIVE
Spec Grav, UA: 1.015 (ref 1.010–1.025)
Urobilinogen, UA: 0.2 U/dL
pH, UA: 6 (ref 5.0–8.0)

## 2023-05-03 MED ORDER — CEPHALEXIN 500 MG PO CAPS: 500.0000 mg | ORAL_CAPSULE | Freq: Two times a day (BID) | ORAL | 0 refills | Status: DC

## 2023-05-03 MED ORDER — OMEPRAZOLE 40 MG PO CPDR
40.0000 mg | DELAYED_RELEASE_CAPSULE | Freq: Every day | ORAL | 1 refills | Status: DC
Start: 2023-05-03 — End: 2023-09-06

## 2023-05-03 MED ORDER — FLUCONAZOLE 150 MG PO TABS
150.0000 mg | ORAL_TABLET | Freq: Once | ORAL | 0 refills | Status: AC
Start: 2023-05-03 — End: 2023-05-03

## 2023-05-03 NOTE — Progress Notes (Signed)
 Established patient visit  Patient: Andrea [name]   DOB: 1945/09/04   78 y.o. Female  MRN: [id number] Visit Date: 05/03/2023  Today's healthcare provider: Debera Lat, PA-C   Chief Complaint  Patient presents with   Vaginitis    Burning. Itching and some discharge. ( X1 week) Painful. Some more frequent peeing.   Subjective     Discussed the use of AI scribe software for clinical note transcription with the patient, who gave verbal consent to proceed.  History of Present Illness   The patient, with a past medical history of diabetes and cardiovascular disease, presents with a chief complaint of a yeast infection and urinary urgency. The patient reports having a white vaginal discharge, which is itchy. The patient also reports urinary urgency and a recent history of prednisone use for a respiratory issue. The patient's daughter confirms that the patient's blood sugar levels had increased during the steroid use but have since returned to normal. The patient also reports a persistent cough and nasal congestion, which the healthcare provider attributes to post-nasal drainage. The patient denies any pain in the kidney area, fever, or chills. The patient's last UTI was several years ago.        05/03/2023    9:59 AM 12/14/2022   11:02 AM 10/20/2022   11:08 AM  Depression screen PHQ 2/9  Decreased Interest 0 0 0  Down, Depressed, Hopeless 0 0 0  PHQ - 2 Score 0 0 0  Altered sleeping 0  0  Tired, decreased energy 0  0  Change in appetite 0  0  Feeling bad or failure about yourself  0  0  Trouble concentrating 0  0  Moving slowly or fidgety/restless 0  0  Suicidal thoughts 0  0  PHQ-9 Score 0  0  Difficult doing work/chores Not difficult at all  Not difficult at all      05/03/2023   10:00 AM 06/23/2021    2:52 PM  GAD 7 : Generalized Anxiety Score  Nervous, Anxious, on Edge 0 3  Control/stop worrying 0 1  Worry too much - different things 0 2  Trouble relaxing 0 0  Restless 0  0  Easily annoyed or irritable 0 1  Afraid - awful might happen 0 1  Total GAD 7 Score 0 8  Anxiety Difficulty Not difficult at all Not difficult at all    Medications: Outpatient Medications Prior to Visit  Medication Sig   Alcohol Swabs (ALCOHOL PADS) 70 % PADS 1 each by Does not apply route in the morning and at bedtime.   alendronate (FOSAMAX) 70 MG tablet Take 1 tablet (70 mg total) by mouth every 7 (seven) days. Take with a full glass of water on an empty stomach.   atorvastatin (LIPITOR) 20 MG tablet Take 1 tablet (20 mg total) by mouth daily.   benzonatate (TESSALON) 100 MG capsule Take 1 capsule (100 mg total) by mouth 3 (three) times daily as needed for cough.   Calcium Carbonate-Vitamin D 600-400 MG-UNIT tablet Take 1 tablet by mouth daily.   Cholecalciferol 25 MCG (1000 UT) capsule Take 1,000 Units by mouth daily.   Cyanocobalamin (B-12) 1000 MCG CAPS Take 1,000 mcg by mouth daily.   dapagliflozin propanediol (FARXIGA) 10 MG TABS tablet Take 10 mg by mouth daily. Receives through [employer] patient assistance program   furosemide (LASIX) 20 MG tablet TAKE 1 TABLET BY MOUTH ONCE DAILY   insulin degludec (TRESIBA FLEXTOUCH) 100 UNIT/ML FlexTouch Pen Inject 28  Units into the skin daily. Patient receives via [employer] Patient Assistance through December 2024   Insulin Pen Needle (TRUEPLUS PEN NEEDLES) 31G X 6 MM MISC Use with lantus two times daily.   losartan (COZAAR) 25 MG tablet TAKE 1 TABLET BY MOUTH ONCE DAILY   metoprolol succinate (TOPROL-XL) 50 MG 24 hr tablet Take 1 tablet (50 mg total) by mouth daily.   montelukast (SINGULAIR) 10 MG tablet TAKE ONE TABLET BY MOUTH EVERYDAY AT BEDTIME   Multiple Vitamins-Minerals (PRESERVISION AREDS 2 PO) Take 1 tablet by mouth in the morning and at bedtime.   Omega-3 1000 MG CAPS Take 1,000 mg by mouth daily.   OneTouch Delica Lancets 33G MISC To check blood sugar three times daily  DX: E11.42   ONETOUCH VERIO test strip Use to check blood  sugar three times daily. DX E11.42   oxybutynin (DITROPAN-XL) 10 MG 24 hr tablet TAKE 1 TABLET BY MOUTH EVERY DAY AT BEDTIME   predniSONE (DELTASONE) 20 MG tablet 2 tabs poqday 1-3, 1 tabs poqday 4-6   Semaglutide,0.25 or 0.5MG /DOS, (OZEMPIC, 0.25 OR 0.5 MG/DOSE,) 2 MG/3ML SOPN Inject 0.5 mg into the skin once a week. Patient receives via [employer] Patient Assistance through December 2024   sertraline (ZOLOFT) 25 MG tablet TAKE 1 TABLET BY MOUTH ONCE DAILY   [DISCONTINUED] omeprazole (PRILOSEC) 40 MG capsule TAKE 1 CAPSULE BY MOUTH EVERY MORNING *REFILL REQUEST*   albuterol (VENTOLIN HFA) 108 (90 Base) MCG/ACT inhaler Inhale 2 puffs into the lungs every 4 (four) hours as needed for wheezing or shortness of breath. (Patient not taking: Reported on 05/03/2023)   guaiFENesin (MUCINEX) 600 MG 12 hr tablet Take 1 tablet (600 mg total) by mouth 2 (two) times daily as needed. (Patient not taking: Reported on 05/03/2023)   No facility-administered medications prior to visit.    Review of Systems  All other systems reviewed and are negative.  All negative Except see HPI       Objective    BP 122/67 (BP Location: Right Arm, Patient Position: Sitting, Cuff Size: Normal)   Pulse 96   Ht 4\' 9"  (1.448 m)   Wt 134 lb 8 oz (61 kg)   SpO2 98%   BMI 29.11 kg/m     Physical Exam Vitals reviewed.  Constitutional:      Appearance: She is normal weight.  HENT:     Head: Normocephalic and atraumatic.     Right Ear: Ear canal and external ear normal.     Left Ear: Ear canal and external ear normal.     Nose: Congestion and rhinorrhea present.     Mouth/Throat:     Pharynx: Posterior oropharyngeal erythema present.     Comments: Postnasal drainage noted Eyes:     General: No scleral icterus.       Right eye: No discharge.        Left eye: No discharge.     Extraocular Movements: Extraocular movements intact.     Pupils: Pupils are equal, round, and reactive to light.  Cardiovascular:      Rate and Rhythm: Normal rate and regular rhythm.  Pulmonary:     Effort: Pulmonary effort is normal.     Breath sounds: Normal breath sounds.  Abdominal:     General: Abdomen is flat. Bowel sounds are normal.     Palpations: Abdomen is soft.     Tenderness: There is abdominal tenderness.  Lymphadenopathy:     Cervical: No cervical adenopathy.  Neurological:  Mental Status: She is alert.      Results for orders placed or performed in visit on 05/03/23  POCT Urinalysis Dipstick  Result Value Ref Range   Color, UA Dark yellow    Clarity, UA Cloudy    Glucose, UA Positive (A) Negative   Bilirubin, UA Positive    Ketones, UA Negative    Spec Grav, UA 1.015 1.010 - 1.025   Blood, UA Trace Hemolyzed (A)    pH, UA 6.0 5.0 - 8.0   Protein, UA Negative Negative   Urobilinogen, UA 0.2 0.2 or 1.0 E.U./dL   Nitrite, UA Positive    Leukocytes, UA Moderate (2+) (A) Negative   Appearance     Odor          Assessment and Plan    Urinary Tract Infection Positive urine dipstick with leukocytes, nitrites, and trace hemolyzed blood. Likely uncomplicated UTI given no fever, flank pain, or recent similar episodes. -Start Cephalexin for 7 days. -Send urine for culture and microscopy.  Vaginal discharge/Vaginal Yeast Infection Recent antibiotic use (prednisone) likely precipitated yeast infection. Symptoms include itching and discharge. -Start Fluconazole 150mg , one tablet every 72 hours for a total of 2 doses. -Consider adding Monistat 3 or 7 if symptoms persist.  Chronic Sinusitis Nasal congestion and post-nasal drip likely contributing to persistent cough. -Start nasal saline rinse or spray, Flonase, and an antihistamine (Allegra, Claritin, Zyrtec).  Diabetes Mellitus Recent prednisone use may have exacerbated blood glucose control. Exacerbation can precipitate yeast infection. Currently on Tresiba, Ozempic, and Farxiga. -Will check A1c at the follow-up  -Schedule follow-up in 2  weeks to review chronic conditions and medications.   In the setting of  Chronic Obstructive Pulmonary Disease (COPD) History of smoking with rough lung sounds on auscultation. No current shortness of breath or chest pain. -Continue current management. -If increased cough or shortness of breath, further evaluation may be needed.      Orders Placed This Encounter  Procedures   Urine Culture   Urinalysis, Routine w reflex microscopic   POCT Urinalysis Dipstick    Return in about 2 weeks (around 05/17/2023) for chronic disease f/u.   The patient was advised to call back or seek an in-person evaluation if the symptoms worsen or if the condition fails to improve as anticipated.  I discussed the assessment and treatment plan with the patient. The patient was provided an opportunity to ask questions and all were answered. The patient agreed with the plan and demonstrated an understanding of the instructions.  I, Debera Lat, PA-C have reviewed all documentation for this visit. The documentation on 05/03/2023  for the exam, diagnosis, procedures, and orders are all accurate and complete.  Debera Lat, [facility], MMS [facility] [phone] (phone) [phone] (fax)  [facility]

## 2023-05-03 NOTE — Telephone Encounter (Signed)
 [facility] pharmacy faxed refill request for the following medications:   omeprazole (PRILOSEC) 40 MG capsule     Please advise

## 2023-05-03 NOTE — Telephone Encounter (Signed)
Rx has been sent to requested pharmacy 

## 2023-05-04 ENCOUNTER — Other Ambulatory Visit: Payer: Self-pay | Admitting: Pharmacist

## 2023-05-04 ENCOUNTER — Telehealth: Payer: Self-pay

## 2023-05-04 ENCOUNTER — Other Ambulatory Visit: Payer: Self-pay | Admitting: Physician Assistant

## 2023-05-04 DIAGNOSIS — E1142 Type 2 diabetes mellitus with diabetic polyneuropathy: Secondary | ICD-10-CM

## 2023-05-04 DIAGNOSIS — I5022 Chronic systolic (congestive) heart failure: Secondary | ICD-10-CM

## 2023-05-04 DIAGNOSIS — R3915 Urgency of urination: Secondary | ICD-10-CM

## 2023-05-04 DIAGNOSIS — N898 Other specified noninflammatory disorders of vagina: Secondary | ICD-10-CM

## 2023-05-04 DIAGNOSIS — J328 Other chronic sinusitis: Secondary | ICD-10-CM

## 2023-05-04 LAB — URINALYSIS, ROUTINE W REFLEX MICROSCOPIC
Bilirubin, UA: NEGATIVE
Ketones, UA: NEGATIVE
Nitrite, UA: NEGATIVE
RBC, UA: NEGATIVE
Specific Gravity, UA: 1.015 (ref 1.005–1.030)
Urobilinogen, Ur: 0.2 mg/dL (ref 0.2–1.0)
pH, UA: 5 (ref 5.0–7.5)

## 2023-05-04 LAB — MICROSCOPIC EXAMINATION
Casts: NONE SEEN /[LPF]
WBC, UA: >30 /[HPF] — AB (ref 0–5)

## 2023-05-04 LAB — CERVICOVAGINAL ANCILLARY ONLY
Bacterial Vaginitis (gardnerella): POSITIVE — AB
Candida Glabrata: POSITIVE — AB
Candida Vaginitis: POSITIVE — AB
Comment: NEGATIVE
Comment: NEGATIVE
Comment: NEGATIVE

## 2023-05-04 LAB — SPECIMEN STATUS REPORT

## 2023-05-04 NOTE — Progress Notes (Signed)
 05/04/2023 Name: Andrea [name] MRN: [id number] DOB: Nov 13, 1945  Chief Complaint  Patient presents with   Medication Management   Medication Assistance    Andrea [name] is a 78 y.o. year old female who presented for a telephone visit.   They were referred to the pharmacist by their PCP for assistance in managing diabetes and medication access.      Subjective:   Care Team: Primary Care Provider: Jacky Kindle, FNP ; Next Scheduled Visit: 07/06/2023 Cardiologist: Antonieta Iba, MD Nurse Care Manager: Juanell Fairly, RN  Medication Access/Adherence  Current Pharmacy:  [facility], [place] - [address] [place] [address] Phone: [phone] Fax: [phone]  [id number] - [place], [place] - [address] AT [facility] [address] [place] [address] Phone: [phone] Fax: [phone]  [facility] [address] [place] - [address] [place] [address] Phone: [phone] Fax: [phone]   Patient reports affordability concerns with their medications: No  Patient reports access/transportation concerns to their pharmacy: No  Patient reports adherence concerns with their medications:  No     Patient uses weekly pillbox (transfers from pill pack to pillbox to incorporate)  Note patient seen for Office Visit with PCP yesterday and provider advised her to: - Start Cephalexin twice daily for 7 days  - Start Fluconazole 150mg , one tablet every 72 hours for a total of 2 doses. - Consider adding Monistat 3 or 7 if symptoms persist - Schedule follow-up in 2 weeks to review chronic conditions and medications   Today patient states expecting to receive cephalexin and fluconazole from pharmacy today and start taking both as directed    Diabetes:   Current medications:  - Farxiga 10 mg daily each morning - Tresiba 28 units daily - Ozempic  0.5 mg weekly on Wednesdays   Medications tried in the past:    Current morning fasting readings: today: 112 Using One Touch Verio meter   Patient denies hypoglycemic s/sx including dizziness, shakiness, sweating.  - Reports carries glucose tablets with her   Statin therapy: atorvastatin 20 mg daily   Current medication access support: Enrolled in patient assistance program for [place] and Ozempic from [employer] and enrollment for [place] patient assistance from [employer] through 03/07/2024 - States has >2 boxes of Ozempic and >1 box of Tresiba remaining     Hypertension/Heart Failure:   Current medications:  ACEi/ARB/ARNI: Losartan 25 mg daily SGLT2i: Farxiga 10 mg daily each morning Beta blocker: Metoprolol ER 50 mg daily Diuretic regimen: Furosemide 20 mg daily    Medications previously tried: Jardiance (Yeast Infection)    Reports has a home upper arm home BP cuff, but denies checking home BP recently   Patient denies hypotensive s/sx including dizziness, lightheadedness.   Objective:  Lab Results  Component Value Date   HGBA1C 7.3 (H) 12/30/2022    Lab Results  Component Value Date   CREATININE 1.48 (H) 12/30/2022   BUN 34 (H) 12/30/2022   NA 142 12/30/2022   K 5.2 12/30/2022   CL 103 12/30/2022   CO2 25 12/30/2022    Lab Results  Component Value Date   CHOL 135 12/30/2022   HDL 69 12/30/2022   LDLCALC 42 12/30/2022   TRIG 141 12/30/2022   CHOLHDL 2.0 12/30/2022   BP Readings from Last 3 Encounters:  05/03/23 122/67  04/21/23 118/68  03/15/23 (!) 122/52   Pulse  Readings from Last 3 Encounters:  05/03/23 96  04/21/23 65  03/15/23 83     Medications Reviewed Today     Reviewed by Manuela Neptune, RPH-CPP (Pharmacist) on 05/04/23 at 1053  Med List Status: <None>   Medication Order Taking? Sig Documenting Provider Last Dose Status Informant  albuterol (VENTOLIN HFA) 108 (90 Base) MCG/ACT inhaler [id number]  Inhale 2 puffs into the lungs  every 4 (four) hours as needed for wheezing or shortness of breath.  Patient not taking: Reported on 05/03/2023   Danelle Berry, PA-C  Active   Alcohol Swabs (ALCOHOL PADS) 70 % PADS [id number]  1 each by Does not apply route in the morning and at bedtime. Jacky Kindle, FNP  Active   alendronate (FOSAMAX) 70 MG tablet [id number]  Take 1 tablet (70 mg total) by mouth every 7 (seven) days. Take with a full glass of water on an empty stomach. Jacky Kindle, FNP  Active   atorvastatin (LIPITOR) 20 MG tablet [id number]  Take 1 tablet (20 mg total) by mouth daily. Jacky Kindle, FNP  Active   benzonatate (TESSALON) 100 MG capsule [id number] No Take 1 capsule (100 mg total) by mouth 3 (three) times daily as needed for cough.  Patient not taking: Reported on 05/04/2023   Danelle Berry, PA-C Not Taking Active   Calcium Carbonate-Vitamin D 600-400 MG-UNIT tablet [id number]  Take 1 tablet by mouth daily. [provider]  Active Nursing Home Medication Administration Guide (MAG)  cephALEXin (KEFLEX) 500 MG capsule [id number]  Take 1 capsule (500 mg total) by mouth 2 (two) times daily. Debera Lat, PA-C  Active   Cholecalciferol 25 MCG (1000 UT) capsule [id number]  Take 1,000 Units by mouth daily. [provider]  Active   Cyanocobalamin (B-12) 1000 MCG CAPS [id number]  Take 1,000 mcg by mouth daily. [provider]  Active Nursing Home Medication Administration Guide (MAG)  dapagliflozin propanediol (FARXIGA) 10 MG TABS tablet [id number] Yes Take 10 mg by mouth daily. Receives through [employer] patient assistance program [provider] Taking Active   furosemide (LASIX) 20 MG tablet [id number]  TAKE 1 TABLET BY MOUTH ONCE DAILY Jacky Kindle, FNP  Active   insulin degludec (TRESIBA FLEXTOUCH) 100 UNIT/ML FlexTouch Pen [id number] Yes Inject 28 Units into the skin daily. Patient receives via [employer] Patient Assistance through December 2024 Jacky Kindle, FNP Taking Active   Insulin  Pen Needle (TRUEPLUS PEN NEEDLES) 31G X 6 MM MISC [id number]  Use with lantus two times daily. Erasmo Downer, MD  Active Nursing Home Medication Administration Guide (MAG)  losartan (COZAAR) 25 MG tablet [id number]  TAKE 1 TABLET BY MOUTH ONCE DAILY Jacky Kindle, FNP  Active   metoprolol succinate (TOPROL-XL) 50 MG 24 hr tablet [id number]  Take 1 tablet (50 mg total) by mouth daily. [name] T, FNP  Active   montelukast (SINGULAIR) 10 MG tablet [id number]  TAKE ONE TABLET BY MOUTH EVERYDAY AT BEDTIME Jacky Kindle, FNP  Active   Multiple Vitamins-Minerals (PRESERVISION AREDS 2 PO) [id number]  Take 1 tablet by mouth in the morning and at bedtime. [provider]  Active Nursing Home Medication Administration Guide (MAG)  Omega-3 1000 MG CAPS [id number]  Take 1,000 mg by mouth daily. [provider]  Active Nursing Home Medication Administration Guide (MAG)  omeprazole (PRILOSEC) 40 MG capsule [id number]  Take 1 capsule (40 mg total) by mouth daily. Debera Lat, PA-C  Active   [employer]  33G MISC [id number]  To check blood sugar three times daily  DX: E11.42 Jacky Kindle, FNP  Active   [facility] VERIO test strip [id number]  Use to check blood sugar three times daily. DX E11.42 [name] T, FNP  Active   oxybutynin (DITROPAN-XL) 10 MG 24 hr tablet [id number]  TAKE 1 TABLET BY MOUTH EVERY DAY AT BEDTIME [name] T, FNP  Active     Discontinued 05/04/23 1053 (Completed Course)   Semaglutide,0.25 or 0.5MG /DOS, (OZEMPIC, 0.25 OR 0.5 MG/DOSE,) 2 MG/3ML SOPN [id number] Yes Inject 0.5 mg into the skin once a week. Patient receives via [employer] Patient Assistance through December 2024 Jacky Kindle, FNP Taking Active            Med Note Constance Haw, Robyn Haber   Tue Dec 14, 2022 10:16 AM) Reports taking the 0.5mg  dose  sertraline (ZOLOFT) 25 MG tablet [id number]  TAKE 1 TABLET BY MOUTH ONCE DAILY Jacky Kindle, FNP  Active   Med List Note Nelia Shi, CPhT  12/18/20 1047): [employer] [phone]              Assessment/Plan:   Diabetes: - Reviewed goal A1c, goal fasting, and goal 2 hour post prandial glucose - Reviewed dietary modifications including importance of having regular well-balanced meals while controlling carbohydrate portion sizes - Recommend to check glucose, keep log of results and have this record to review during future medical appointments - Patient to contact [employer] as needed for refills of Farxiga and [employer] as needed for refills of Tresiba and Ozempic     Hypertension/Heart Failure: - Encourage patient to restart checking home blood pressure and heart rate, keep log of results and have this record to review during future medical appointments       Follow Up Plan: Clinical Pharmacist will follow up with patient by telephone on 07/27/2023 at 11:00 AM      Estelle Grumbles, PharmD, [facility] [phone]

## 2023-05-04 NOTE — Patient Instructions (Signed)
 Goals Addressed             This Visit's Progress    Pharmacy Goals       If you need to reach out to patient assistance programs regarding refills or to find out the status of your application, you can do so by calling:  [employer] at [phone] [employer] at [phone]  Our goal A1c is less than 7%. This corresponds with fasting sugars less than 130 and 2 hour after meal sugars less than 180. Please keep a log of your results when checking your blood sugar   Our goal bad cholesterol, or LDL, is less than 70. This is why it is important to continue taking your atorvastatin.  Check your blood pressure twice weekly, and any time you have concerning symptoms like headache, chest pain, dizziness, shortness of breath, or vision changes.   Our goal is less than 130/80.  To appropriately check your blood pressure, make sure you do the following:  1) Avoid caffeine, exercise, or tobacco products for 30 minutes before checking. Empty your bladder. 2) Sit with your back supported in a flat-backed chair. Rest your arm on something flat (arm of the chair, table, etc). 3) Sit still with your feet flat on the floor, resting, for at least 5 minutes.  4) Check your blood pressure. Take 1-2 readings.  5) Write down these readings and bring with you to any provider appointments.  Bring your home blood pressure machine with you to a provider's office for accuracy comparison at least once a year.   Make sure you take your blood pressure medications before you come to any office visit, even if you were asked to fast for labs.  Estelle Grumbles, PharmD, [facility] [phone]

## 2023-05-04 NOTE — Telephone Encounter (Signed)
 Copied from CRM [id number]. Topic: Clinical - Medication Question >> May 04, 2023 11:39 AM Antony Haste wrote: Reason for CRM: [facility] Staff requesting a callback a clinical nurse to discuss patient's medications. Staffmember states she would like to verify how often/how much the patient should take for cephALEXin. Callback [phone]

## 2023-05-05 NOTE — Telephone Encounter (Signed)
 Requested medication (s) are due for refill today - no  Requested medication (s) are on the active medication list -yes  Future visit scheduled -yes  Last refill: 05/03/23 #14  Notes to clinic: off protocol- provider review- Rx for acute problem  Requested Prescriptions  Pending Prescriptions Disp Refills   cephALEXin (KEFLEX) 500 MG capsule [Pharmacy Med Name: CEPHALEXIN 500 MG CAPS 500 Capsule] 14 capsule 10    Sig: TAKE ONE (1) CAPSULE BY MOUTH TWICE DAILY     Off-Protocol Failed - 05/05/2023  2:10 PM      Failed - Medication not assigned to a protocol, review manually.      Passed - Valid encounter within last 12 months    Recent Outpatient Visits           4 months ago Diabetes mellitus due to underlying condition with diabetic autonomic neuropathy, unspecified whether long term insulin use (HCC)   [facility] [name] T, FNP   6 months ago Skin tear of left elbow without complication, sequela   [facility] [name] T, FNP   7 months ago Type 2 diabetes mellitus with diabetic polyneuropathy, without long-term current use of insulin [facility])   [facility] [name] T, FNP   10 months ago Type 2 diabetes mellitus with diabetic polyneuropathy, without long-term current use of insulin [facility])   [facility] [name] T, FNP   1 year ago Type 2 diabetes mellitus with diabetic polyneuropathy, without long-term current use of insulin [facility]   [facility] Jacky Kindle, FNP       Future Appointments             In 1 week Debera Lat, PA-C [facility], [facility]               Requested Prescriptions  Pending Prescriptions Disp Refills   cephALEXin (KEFLEX) 500 MG capsule [Pharmacy Med Name: CEPHALEXIN 500 MG CAPS 500 Capsule] 14 capsule 10    Sig: TAKE ONE (1) CAPSULE BY MOUTH TWICE DAILY     Off-Protocol  Failed - 05/05/2023  2:10 PM      Failed - Medication not assigned to a protocol, review manually.      Passed - Valid encounter within last 12 months    Recent Outpatient Visits           4 months ago Diabetes mellitus due to underlying condition with diabetic autonomic neuropathy, unspecified whether long term insulin use (HCC)   [facility] [name] T, FNP   6 months ago Skin tear of left elbow without complication, sequela   [facility] [name] T, FNP   7 months ago Type 2 diabetes mellitus with diabetic polyneuropathy, without long-term current use of insulin [facility])   [facility] [name] T, FNP   10 months ago Type 2 diabetes mellitus with diabetic polyneuropathy, without long-term current use of insulin [facility])   [facility] [name] T, FNP   1 year ago Type 2 diabetes mellitus with diabetic polyneuropathy, without long-term current use of insulin [facility])   [facility] Jacky Kindle, FNP       Future Appointments             In 1 week Debera Lat, PA-C [facility], [facility]

## 2023-05-06 ENCOUNTER — Other Ambulatory Visit: Payer: Self-pay | Admitting: Physician Assistant

## 2023-05-06 DIAGNOSIS — B9689 Other specified bacterial agents as the cause of diseases classified elsewhere: Secondary | ICD-10-CM

## 2023-05-06 DIAGNOSIS — J328 Other chronic sinusitis: Secondary | ICD-10-CM

## 2023-05-06 DIAGNOSIS — B3731 Acute candidiasis of vulva and vagina: Secondary | ICD-10-CM

## 2023-05-06 DIAGNOSIS — R3915 Urgency of urination: Secondary | ICD-10-CM

## 2023-05-06 NOTE — Telephone Encounter (Signed)
 Called [facility] and medication has already been dispense. Patient requesting [facility] topical cre[facility] [place]

## 2023-05-06 NOTE — Progress Notes (Signed)
 Please call and let the facility know that cephalexin should be taken twice daily. We need to know what pharmacy should we use I will call in 2 topical creams clindamycin and monistat to use intravaginally daily. She needs to schedule an appointment  if symptoms persist or reoccur Apply these medications as per their specific instructions for intravaginal use. Clindamycin should be applied intravaginally as instructed by the manufacturer Miconazole can be applied intravaginally and to the external vulvar area to relieve itching and discomfort. Use the products labeled for intravaginal use and apply with the special applicator provided

## 2023-05-09 ENCOUNTER — Telehealth: Payer: Self-pay

## 2023-05-09 NOTE — Telephone Encounter (Signed)
 Requested medication (s) are due for refill today: routing for review  Requested medication (s) are on the active medication list: yes  Last refill:  05/03/23  Future visit scheduled: yes  Medication not assigned to a protocol, review manually.  Notes to clinic:       Requested Prescriptions  Pending Prescriptions Disp Refills   fluconazole (DIFLUCAN) 150 MG tablet [Pharmacy Med Name: FLUCONAZOLE 150 MG TABS 150 Tablet] 1 tablet 10    Sig: TAKE 1 TABLET BY MOUTH ONCE FOR 1 DOSE.     Off-Protocol Failed - 05/09/2023  1:36 PM      Failed - Medication not assigned to a protocol, review manually.      Passed - Valid encounter within last 12 months    Recent Outpatient Visits           4 months ago Diabetes mellitus due to underlying condition with diabetic autonomic neuropathy, unspecified whether long term insulin use (HCC)   [place] [facility] [name] T, FNP   6 months ago Skin tear of left elbow without complication, sequela   [facility] [name] T, FNP   7 months ago Type 2 diabetes mellitus with diabetic polyneuropathy, without long-term current use of insulin [facility])   [place] [facility] [name] T, FNP   11 months ago Type 2 diabetes mellitus with diabetic polyneuropathy, without long-term current use of insulin [facility])   [place] [facility] [name] T, FNP   1 year ago Type 2 diabetes mellitus with diabetic polyneuropathy, without long-term current use of insulin [facility])   [place] [facility] Jacky Kindle, FNP       Future Appointments             In 1 week Debera Lat, PA-C [facility], [facility]

## 2023-05-09 NOTE — Telephone Encounter (Signed)
 CRM was received requesting a call back regarding a medication that [place] recently sent to pharmacy.  Message did not have name of medication and CRM closed before transferring to phone message.  There is an active medication refill to be sent in for patient.

## 2023-05-09 NOTE — Telephone Encounter (Unsigned)
 Copied from CRM [id number]. Topic: Clinical - Medication Question >> May 09, 2023 10:09 AM Maree Krabbe H wrote: Reason for CRM: Patient is calling about a medication that is not listed and she has some questions about it, she states that [name] sent it in for her, patients callback number is [phone].

## 2023-05-11 MED ORDER — CLINDAMYCIN PHOSPHATE 2 % VA CREA
1.0000 | TOPICAL_CREAM | Freq: Every day | VAGINAL | 0 refills | Status: DC
Start: 2023-05-11 — End: 2023-05-17

## 2023-05-11 MED ORDER — MICONAZOLE NITRATE 2 % VA CREA: 1.0000 | TOPICAL_CREAM | Freq: Every day | VAGINAL | 0 refills | Status: DC

## 2023-05-11 NOTE — Telephone Encounter (Signed)
 Duplicate encounter. Please see telephone encounter from 05/04/23.  Msg sent to provider to please sign for orders that she has pended on 05/06/23

## 2023-05-11 NOTE — Addendum Note (Signed)
 Addended by: Debera Lat on: 05/11/2023 05:04 PM   Modules accepted: Orders

## 2023-05-11 NOTE — Telephone Encounter (Signed)
 Msg sent to provider to please sign for orders that she has pended on 05/06/23 Please see encounter on 05/04/23

## 2023-05-17 ENCOUNTER — Encounter: Payer: Self-pay | Admitting: Physician Assistant

## 2023-05-17 ENCOUNTER — Other Ambulatory Visit: Payer: Self-pay

## 2023-05-17 ENCOUNTER — Ambulatory Visit (INDEPENDENT_AMBULATORY_CARE_PROVIDER_SITE_OTHER): Payer: Medicare Other | Admitting: Physician Assistant

## 2023-05-17 VITALS — BP 113/49 | HR 78

## 2023-05-17 DIAGNOSIS — I10 Essential (primary) hypertension: Secondary | ICD-10-CM | POA: Diagnosis not present

## 2023-05-17 DIAGNOSIS — E785 Hyperlipidemia, unspecified: Secondary | ICD-10-CM

## 2023-05-17 DIAGNOSIS — N760 Acute vaginitis: Secondary | ICD-10-CM

## 2023-05-17 DIAGNOSIS — E1169 Type 2 diabetes mellitus with other specified complication: Secondary | ICD-10-CM | POA: Diagnosis not present

## 2023-05-17 DIAGNOSIS — E0843 Diabetes mellitus due to underlying condition with diabetic autonomic (poly)neuropathy: Secondary | ICD-10-CM

## 2023-05-17 DIAGNOSIS — R21 Rash and other nonspecific skin eruption: Secondary | ICD-10-CM

## 2023-05-17 DIAGNOSIS — B9689 Other specified bacterial agents as the cause of diseases classified elsewhere: Secondary | ICD-10-CM

## 2023-05-17 DIAGNOSIS — Z7984 Long term (current) use of oral hypoglycemic drugs: Secondary | ICD-10-CM

## 2023-05-17 DIAGNOSIS — Z794 Long term (current) use of insulin: Secondary | ICD-10-CM

## 2023-05-17 MED ORDER — FLUCONAZOLE 150 MG PO TABS: 150.0000 mg | ORAL_TABLET | Freq: Once | ORAL | 0 refills | Status: AC

## 2023-05-17 MED ORDER — CLINDAMYCIN PHOSPHATE 2 % VA CREA: 1.0000 | TOPICAL_CREAM | Freq: Every day | VAGINAL | 0 refills | Status: DC

## 2023-05-17 MED ORDER — FLUCONAZOLE 150 MG PO TABS: 150.0000 mg | ORAL_TABLET | Freq: Once | ORAL | 0 refills | Status: DC

## 2023-05-17 MED ORDER — KETOCONAZOLE 2 % EX CREA: 1.0000 | TOPICAL_CREAM | Freq: Every day | CUTANEOUS | 1 refills | Status: DC

## 2023-05-17 NOTE — Progress Notes (Unsigned)
 Established patient visit  Patient: Andrea [name]   DOB: [birth year]   78 y.o. Female  MRN: [id number] Visit Date: 05/17/2023  Today's healthcare provider: Debera Lat, PA-C   Chief Complaint  Patient presents with   Follow-up    Chronic disease f/u, Pt also wanted to address the yeast infection she was treated it for. Yeast is still there. And Also she has under breast and around stomach area.    Subjective     HPI     Follow-up    Additional comments: Chronic disease f/u, Pt also wanted to address the yeast infection she was treated it for. Yeast is still there. And Also she has under breast and around stomach area.       Last edited by Liz Beach, CMA on 05/17/2023  1:37 PM.       Discussed the use of AI scribe software for clinical note transcription with the patient, who gave verbal consent to proceed.  History of Present Illness               05/03/2023    9:59 AM 12/14/2022   11:02 AM 10/20/2022   11:08 AM  Depression screen PHQ 2/9  Decreased Interest 0 0 0  Down, Depressed, Hopeless 0 0 0  PHQ - 2 Score 0 0 0  Altered sleeping 0  0  Tired, decreased energy 0  0  Change in appetite 0  0  Feeling bad or failure about yourself  0  0  Trouble concentrating 0  0  Moving slowly or fidgety/restless 0  0  Suicidal thoughts 0  0  PHQ-9 Score 0  0  Difficult doing work/chores Not difficult at all  Not difficult at all      05/03/2023   10:00 AM 06/23/2021    2:52 PM  GAD 7 : Generalized Anxiety Score  Nervous, Anxious, on Edge 0 3  Control/stop worrying 0 1  Worry too much - different things 0 2  Trouble relaxing 0 0  Restless 0 0  Easily annoyed or irritable 0 1  Afraid - awful might happen 0 1  Total GAD 7 Score 0 8  Anxiety Difficulty Not difficult at all Not difficult at all    Medications: Outpatient Medications Prior to Visit  Medication Sig   albuterol (VENTOLIN HFA) 108 (90 Base) MCG/ACT inhaler Inhale 2 puffs into the lungs every 4  (four) hours as needed for wheezing or shortness of breath.   Alcohol Swabs (ALCOHOL PADS) 70 % PADS 1 each by Does not apply route in the morning and at bedtime.   alendronate (FOSAMAX) 70 MG tablet Take 1 tablet (70 mg total) by mouth every 7 (seven) days. Take with a full glass of water on an empty stomach.   atorvastatin (LIPITOR) 20 MG tablet Take 1 tablet (20 mg total) by mouth daily.   benzonatate (TESSALON) 100 MG capsule Take 1 capsule (100 mg total) by mouth 3 (three) times daily as needed for cough.   Calcium Carbonate-Vitamin D 600-400 MG-UNIT tablet Take 1 tablet by mouth daily.   cephALEXin (KEFLEX) 500 MG capsule Take 1 capsule (500 mg total) by mouth 2 (two) times daily.   Cholecalciferol 25 MCG (1000 UT) capsule Take 1,000 Units by mouth daily.   Cyanocobalamin (B-12) 1000 MCG CAPS Take 1,000 mcg by mouth daily.   dapagliflozin propanediol (FARXIGA) 10 MG TABS tablet Take 10 mg by mouth daily. [place]ceives through [place] patient [place]   furosemide (  LASIX) 20 MG tablet TAKE 1 TABLET BY MOUTH ONCE DAILY   insulin degludec (TRESIBA FLEXTOUCH) 100 UNIT/ML FlexTouch Pen Inject 28 Units into the skin daily. Patient receives via [employer] Patient Assistance through December 2024   Insulin Pen Needle (TRUEPLUS PEN NEEDLES) 31G X 6 MM MISC Use with lantus two times daily.   losartan (COZAAR) 25 MG tablet TAKE 1 TABLET BY MOUTH ONCE DAILY   metoprolol succinate (TOPROL-XL) 50 MG 24 hr tablet Take 1 tablet (50 mg total) by mouth daily.   montelukast (SINGULAIR) 10 MG tablet TAKE ONE TABLET BY MOUTH EVERYDAY AT BEDTIME   Multiple Vitamins-Minerals (PRESERVISION AREDS 2 PO) Take 1 tablet by mouth in the morning and at bedtime.   Omega-3 1000 MG CAPS Take 1,000 mg by mouth daily.   omeprazole (PRILOSEC) 40 MG capsule Take 1 capsule (40 mg total) by mouth daily.   OneTouch Delica Lancets 33G MISC To check blood sugar three times daily  DX: E11.42   ONETOUCH VERIO test strip Use to  check blood sugar three times daily. DX E11.42   oxybutynin (DITROPAN-XL) 10 MG 24 hr tablet TAKE 1 TABLET BY MOUTH EVERY DAY AT BEDTIME   Semaglutide,0.25 or 0.5MG /DOS, (OZEMPIC, 0.25 OR 0.5 MG/DOSE,) 2 MG/3ML SOPN Inject 0.5 mg into the skin once a week. Patient receives via [employer] Patient Assistance through December 2024   sertraline (ZOLOFT) 25 MG tablet TAKE 1 TABLET BY MOUTH ONCE DAILY   [DISCONTINUED] clindamycin (CLEOCIN) 2 % vaginal cream Place 1 Applicatorful vaginally at bedtime.   [DISCONTINUED] miconazole (MONISTAT 7 SIMPLY CURE) 2 % vaginal cream Place 1 Applicatorful vaginally at bedtime.   No facility-administered medications prior to visit.    Review of Systems All negative Except see HPI   {Insert previous labs (optional):23779} {See past labs  Heme  Chem  Endocrine  Serology  Results Review (optional):1}   Objective    BP (!) 113/49 (BP Location: Right Arm, Patient Position: Sitting)   Pulse 78   SpO2 97%  {Insert last BP/Wt (optional):23777}{See vitals history (optional):1}   Physical Exam   No results found for any visits on 05/17/23.      Assessment and Plan             Orders Placed This Encounter  Procedures   CBC with Differential/Platelet   Comprehensive metabolic panel    Has the patient fasted?:   Yes   Hemoglobin A1c   Lipid panel    Has the patient fasted?:   Yes    Return in about 6 weeks (around 06/28/2023) for chronic disease f/u.   The patient was advised to call back or seek an in-person evaluation if the symptoms worsen or if the condition fails to improve as anticipated.  I discussed the assessment and treatment plan with the patient. The patient was provided an opportunity to ask questions and all were answered. The patient agreed with the plan and demonstrated an understanding of the instructions.  I, Debera Lat, PA-C have reviewed all documentation for this visit. The documentation on 05/17/2023  for the exam,  diagnosis, procedures, and orders are all accurate and complete.  Debera Lat, [facility], MMS [facility] [phone] (phone) [phone] (fax)  [facility]

## 2023-05-24 ENCOUNTER — Encounter: Payer: Self-pay | Admitting: Physician Assistant

## 2023-05-24 LAB — COMPREHENSIVE METABOLIC PANEL
ALT: 10 IU/L (ref 0–32)
AST: 10 IU/L (ref 0–40)
Albumin: 3.6 g/dL — ABNORMAL LOW (ref 3.8–4.8)
Alkaline Phosphatase: 161 IU/L — ABNORMAL HIGH (ref 44–121)
BUN/Creatinine Ratio: 20 (ref 12–28)
BUN: 26 mg/dL (ref 8–27)
Bilirubin Total: 0.7 mg/dL (ref 0.0–1.2)
CO2: 20 mmol/L (ref 20–29)
Calcium: 8.6 mg/dL — ABNORMAL LOW (ref 8.7–10.3)
Chloride: 101 mmol/L (ref 96–106)
Creatinine, Ser: 1.31 mg/dL — ABNORMAL HIGH (ref 0.57–1.00)
Globulin, Total: 2.8 g/dL (ref 1.5–4.5)
Glucose: 177 mg/dL — ABNORMAL HIGH (ref 70–99)
Potassium: 4.8 mmol/L (ref 3.5–5.2)
Sodium: 138 mmol/L (ref 134–144)
Total Protein: 6.4 g/dL (ref 6.0–8.5)
eGFR: 42 mL/min/{1.73_m2} — ABNORMAL LOW (ref >59)

## 2023-05-24 LAB — CBC WITH DIFFERENTIAL/PLATELET
Basophils Absolute: 0 10*3/uL (ref 0.0–0.2)
Basos: 0 %
EOS (ABSOLUTE): 0 10*3/uL (ref 0.0–0.4)
Eos: 0 %
Hematocrit: 36.3 % (ref 34.0–46.6)
Hemoglobin: 11.7 g/dL (ref 11.1–15.9)
Immature Grans (Abs): 0 10*3/uL (ref 0.0–0.1)
Immature Granulocytes: 0 %
Lymphocytes Absolute: 1.4 10*3/uL (ref 0.7–3.1)
Lymphs: 9 %
MCH: 29.6 pg (ref 26.6–33.0)
MCHC: 32.2 g/dL (ref 31.5–35.7)
MCV: 92 fL (ref 79–97)
Monocytes Absolute: 0.9 10*3/uL (ref 0.1–0.9)
Monocytes: 6 %
Neutrophils Absolute: 12.9 10*3/uL — ABNORMAL HIGH (ref 1.4–7.0)
Neutrophils: 85 %
Platelets: 283 10*3/uL (ref 150–450)
RBC: 3.95 x10E6/uL (ref 3.77–5.28)
RDW: 13.8 % (ref 11.7–15.4)
WBC: 15.3 10*3/uL — ABNORMAL HIGH (ref 3.4–10.8)

## 2023-05-24 LAB — LIPID PANEL
Chol/HDL Ratio: 2 ratio (ref 0.0–4.4)
Cholesterol, Total: 127 mg/dL (ref 100–199)
HDL: 62 mg/dL (ref >39)
LDL Chol Calc (NIH): 43 mg/dL (ref 0–99)
Triglycerides: 124 mg/dL (ref 0–149)
VLDL Cholesterol Cal: 22 mg/dL (ref 5–40)

## 2023-05-24 LAB — HEMOGLOBIN A1C
Est. average glucose Bld gHb Est-mCnc: 157 mg/dL
Hgb A1c MFr Bld: 7.1 % — ABNORMAL HIGH (ref 4.8–5.6)

## 2023-06-07 ENCOUNTER — Telehealth: Payer: Self-pay | Admitting: Physician Assistant

## 2023-06-07 NOTE — Telephone Encounter (Signed)
 Informed  patient that Evaristo Bury has arrived and is ready for pick up.

## 2023-06-07 NOTE — Telephone Encounter (Signed)
 Novo fine tips arrived & ready for pick-Patient was notified

## 2023-06-07 NOTE — Telephone Encounter (Signed)
 Informed patient that Ozempic has arrived and is ready for pick up. Patient will pick up on 06/10/23 Friday

## 2023-06-24 ENCOUNTER — Other Ambulatory Visit: Payer: Self-pay

## 2023-06-27 ENCOUNTER — Other Ambulatory Visit: Payer: Self-pay | Admitting: Physician Assistant

## 2023-06-27 DIAGNOSIS — I5022 Chronic systolic (congestive) heart failure: Secondary | ICD-10-CM

## 2023-06-27 DIAGNOSIS — N3281 Overactive bladder: Secondary | ICD-10-CM

## 2023-06-27 NOTE — Telephone Encounter (Signed)
 Copied from CRM [id number]. Topic: Clinical - Medication Refill >> Jun 27, 2023 10:31 AM Marlan Silva wrote: Most Recent Primary Care Visit:  Provider: OSTWALT, JANNA  Department: [facility]  Visit Type: OFFICE VISIT  Date: 05/17/2023  Medication: furosemide  (LASIX ) 20 MG tablet, oxybutynin  (DITROPAN -XL) 10 MG 24 hr tablet  Has the patient contacted their pharmacy? Yes (Agent: If no, request that the patient contact the pharmacy for the refill. If patient does not wish to contact the pharmacy document the reason why and proceed with request.) (Agent: If yes, when and what did the pharmacy advise?)  Is this the correct pharmacy for this prescription? Yes If no, delete pharmacy and type the correct one.  This is the patient's preferred pharmacy:  [facility] [place]  Dorrene Gaucher, [place] - [address] [place] [address] Phone: [phone] Fax: [phone]  [id number] - [place], [place] - [address] AT [facility] [address] [place] [address] Phone: [phone] Fax: [phone]  Has the prescription been filled recently? No  Is the patient out of the medication? Yes  Has the patient been seen for an appointment in the last year OR does the patient have an upcoming appointment? Yes  Can we respond through MyChart? Yes  Agent: Please be advised that Rx refills may take up to 3 business days. We ask that you follow-up with your pharmacy.

## 2023-06-28 MED ORDER — FUROSEMIDE 20 MG PO TABS: 20.0000 mg | ORAL_TABLET | Freq: Every day | ORAL | 0 refills | Status: DC

## 2023-06-28 MED ORDER — OXYBUTYNIN CHLORIDE ER 10 MG PO TB24: 10.0000 mg | ORAL_TABLET | Freq: Every day | ORAL | 0 refills | Status: DC

## 2023-06-28 NOTE — Telephone Encounter (Signed)
 Requested Prescriptions  Pending Prescriptions Disp Refills   furosemide  (LASIX ) 20 MG tablet 90 tablet 0    Sig: Take 1 tablet (20 mg total) by mouth daily.     Cardiovascular:  Diuretics - Loop Failed - 06/28/2023 10:32 AM      Failed - Ca in normal range and within 180 days    Calcium   Date Value Ref Range Status  05/23/2023 8.6 (L) 8.7 - 10.3 mg/dL Final   Calcium , Total  Date Value Ref Range Status  01/01/2014 8.3 (L) 8.5 - 10.1 mg/dL Final         Failed - Cr in normal range and within 180 days    Creat  Date Value Ref Range Status  12/16/2016 1.08 (H) 0.60 - 0.93 mg/dL Final    Comment:    For patients >17 years of age, the reference limit for Creatinine is approximately 13% higher for people identified as African-American. .    Creatinine, Ser  Date Value Ref Range Status  05/23/2023 1.31 (H) 0.57 - 1.00 mg/dL Final         Failed - Mg Level in normal range and within 180 days    Magnesium   Date Value Ref Range Status  12/21/2020 1.8 1.7 - 2.4 mg/dL Final    Comment:    Performed at [facility], [address] [place], [place] [address]         Failed - Last BP in normal range    BP Readings from Last 1 Encounters:  05/17/23 (!) 113/49         Passed - K in normal range and within 180 days    Potassium  Date Value Ref Range Status  05/23/2023 4.8 3.5 - 5.2 mmol/L Final  01/01/2014 4.2 3.5 - 5.1 mmol/L Final         Passed - Na in normal range and within 180 days    Sodium  Date Value Ref Range Status  05/23/2023 138 134 - 144 mmol/L Final  01/01/2014 143 136 - 145 mmol/L Final         Passed - Cl in normal range and within 180 days    Chloride  Date Value Ref Range Status  05/23/2023 101 96 - 106 mmol/L Final  01/01/2014 105 98 - 107 mmol/L Final         Passed - Valid encounter within last 6 months    Recent Outpatient Visits           1 month ago Diabetes mellitus due to underlying condition with diabetic autonomic  neuropathy, unspecified whether long term insulin  use (HCC)   [place] [facility] [place], [place], PA-C   1 month ago Urinary urgency   [place] [facility] [place], [place], PA-C   2 months ago Acute cough   [facility] Adeline Hone, PA-C       Future Appointments             Tomorrow Ostwalt, Janna, PA-C [place] [facility]             oxybutynin  (DITROPAN -XL) 10 MG 24 hr tablet 90 tablet 0    Sig: Take 1 tablet (10 mg total) by mouth at bedtime.     Urology:  Bladder Agents Passed - 06/28/2023 10:32 AM      Passed - Valid encounter within last 12 months    Recent Outpatient Visits  1 month ago Diabetes mellitus due to underlying condition with diabetic autonomic neuropathy, unspecified whether long term insulin  use [facility])   [place] [facility] [place], [place], PA-C   1 month ago Urinary urgency   [place] [facility] [place], [place], PA-C   2 months ago Acute cough   [facility] Adeline Hone, PA-C       Future Appointments             Tomorrow Ostwalt, Janna, PA-C  [facility], [place]

## 2023-06-29 ENCOUNTER — Encounter: Payer: Self-pay | Admitting: Physician Assistant

## 2023-06-29 ENCOUNTER — Ambulatory Visit: Admitting: Physician Assistant

## 2023-06-29 NOTE — Progress Notes (Signed)
 Established patient visit  Patient: Andrea [name]   DOB: 20-Jun-1945   78 y.o. Female  MRN: [id number] Visit Date: 06/29/2023  Today's healthcare provider: Jolynn Spencer, PA-C   No chief complaint on file.  Subjective       Discussed the use of AI scribe software for clinical note transcription with the patient, who gave verbal consent to proceed.  History of Present Illness        05/03/2023    9:59 AM 12/14/2022   11:02 AM 10/20/2022   11:08 AM  Depression screen PHQ 2/9  Decreased Interest 0 0 0  Down, Depressed, Hopeless 0 0 0  PHQ - 2 Score 0 0 0  Altered sleeping 0  0  Tired, decreased energy 0  0  Change in appetite 0  0  Feeling bad or failure about yourself  0  0  Trouble concentrating 0  0  Moving slowly or fidgety/restless 0  0  Suicidal thoughts 0  0  PHQ-9 Score 0  0  Difficult doing work/chores Not difficult at all  Not difficult at all      05/03/2023   10:00 AM 06/23/2021    2:52 PM  GAD 7 : Generalized Anxiety Score  Nervous, Anxious, on Edge 0 3  Control/stop worrying 0 1  Worry too much - different things 0 2  Trouble relaxing 0 0  Restless 0 0  Easily annoyed or irritable 0 1  Afraid - awful might happen 0 1  Total GAD 7 Score 0 8  Anxiety Difficulty Not difficult at all Not difficult at all    Medications: Outpatient Medications Prior to Visit  Medication Sig   albuterol  (VENTOLIN  HFA) 108 (90 Base) MCG/ACT inhaler Inhale 2 puffs into the lungs every 4 (four) hours as needed for wheezing or shortness of breath.   Alcohol  Swabs (ALCOHOL  PADS) 70 % PADS 1 each by Does not apply route in the morning and at bedtime.   alendronate  (FOSAMAX ) 70 MG tablet Take 1 tablet (70 mg total) by mouth every 7 (seven) days. Take with a full glass of water on an empty stomach.   atorvastatin  (LIPITOR ) 20 MG tablet Take 1 tablet (20 mg total) by mouth daily.   benzonatate  (TESSALON ) 100 MG capsule Take 1 capsule (100 mg total) by mouth 3 (three) times daily  as needed for cough.   Calcium  Carbonate-Vitamin D  600-400 MG-UNIT tablet Take 1 tablet by mouth daily.   cephALEXin  (KEFLEX ) 500 MG capsule Take 1 capsule (500 mg total) by mouth 2 (two) times daily.   Cholecalciferol  25 MCG (1000 UT) capsule Take 1,000 Units by mouth daily.   clindamycin  (CLEOCIN ) 2 % vaginal cream Place 1 Applicatorful vaginally at bedtime.   Cyanocobalamin  (B-12) 1000 MCG CAPS Take 1,000 mcg by mouth daily.   dapagliflozin  propanediol (FARXIGA ) 10 MG TABS tablet Take 10 mg by mouth daily. Receives through [place] patient [place]   furosemide  (LASIX ) 20 MG tablet Take 1 tablet (20 mg total) by mouth daily.   insulin  degludec (TRESIBA  FLEXTOUCH) 100 UNIT/ML FlexTouch Pen Inject 28 Units into the skin daily. Patient receives via [employer] Patient Assistance through December 2024   Insulin  Pen Needle (TRUEPLUS PEN NEEDLES) 31G X 6 MM MISC Use with lantus  two times daily.   ketoconazole  (NIZORAL ) 2 % cream Apply 1 Application topically daily.   losartan  (COZAAR ) 25 MG tablet TAKE 1 TABLET BY MOUTH ONCE DAILY   metoprolol  succinate (TOPROL -XL) 50 MG 24 hr tablet  Take 1 tablet (50 mg total) by mouth daily.   montelukast  (SINGULAIR ) 10 MG tablet TAKE ONE TABLET BY MOUTH EVERYDAY AT BEDTIME   Multiple Vitamins-Minerals (PRESERVISION AREDS 2 PO) Take 1 tablet by mouth in the morning and at bedtime.   Omega-3 1000 MG CAPS Take 1,000 mg by mouth daily.   omeprazole  (PRILOSEC) 40 MG capsule Take 1 capsule (40 mg total) by mouth daily.   OneTouch Delica Lancets 33G MISC To check blood sugar three times daily  DX: E11.42   ONETOUCH VERIO test strip Use to check blood sugar three times daily. DX E11.42   oxybutynin  (DITROPAN -XL) 10 MG 24 hr tablet Take 1 tablet (10 mg total) by mouth at bedtime.   Semaglutide ,0.25 or 0.5MG /DOS, (OZEMPIC , 0.25 OR 0.5 MG/DOSE,) 2 MG/3ML SOPN Inject 0.5 mg into the skin once a week. Patient receives via [employer] Patient Assistance through  December 2024   sertraline  (ZOLOFT ) 25 MG tablet TAKE 1 TABLET BY MOUTH ONCE DAILY   No facility-administered medications prior to visit.    Review of Systems All negative Except see HPI       Objective    There were no vitals taken for this visit.    Physical Exam   No results found for any visits on 06/29/23.      Assessment and Plan Assessment & Plan     No orders of the defined types were placed in this encounter.   No follow-ups on file.   The patient was advised to call back or seek an in-person evaluation if the symptoms worsen or if the condition fails to improve as anticipated.  I discussed the assessment and treatment plan with the patient. The patient was provided an opportunity to ask questions and all were answered. The patient agreed with the plan and demonstrated an understanding of the instructions.  I, Marian Meneely, PA-C have reviewed all documentation for this visit. The documentation on 06/29/2023  for the exam, diagnosis, procedures, and orders are all accurate and complete.  Jolynn Spencer, [facility], MMS [facility] [phone] (phone) [phone] (fax)  [facility]

## 2023-06-29 NOTE — Progress Notes (Deleted)
 Established patient visit  Patient: Andrea [name]   DOB: [birth year]   78 y.o. Female  MRN: [id number] Visit Date: 06/29/2023  Today's healthcare provider: Blane Bunting, PA-C   No chief complaint on file.  Subjective       Discussed the use of AI scribe software for clinical note transcription with the patient, who gave verbal consent to proceed.  History of Present Illness        05/03/2023    9:59 AM 12/14/2022   11:02 AM 10/20/2022   11:08 AM  Depression screen PHQ 2/9  Decreased Interest 0 0 0  Down, Depressed, Hopeless 0 0 0  PHQ - 2 Score 0 0 0  Altered sleeping 0  0  Tired, decreased energy 0  0  Change in appetite 0  0  Feeling bad or failure about yourself  0  0  Trouble concentrating 0  0  Moving slowly or fidgety/restless 0  0  Suicidal thoughts 0  0  PHQ-9 Score 0  0  Difficult doing work/chores Not difficult at all  Not difficult at all      05/03/2023   10:00 AM 06/23/2021    2:52 PM  GAD 7 : Generalized Anxiety Score  Nervous, Anxious, on Edge 0 3  Control/stop worrying 0 1  Worry too much - different things 0 2  Trouble relaxing 0 0  Restless 0 0  Easily annoyed or irritable 0 1  Afraid - awful might happen 0 1  Total GAD 7 Score 0 8  Anxiety Difficulty Not difficult at all Not difficult at all    Medications: Outpatient Medications Prior to Visit  Medication Sig   albuterol  (VENTOLIN  HFA) 108 (90 Base) MCG/ACT inhaler Inhale 2 puffs into the lungs every 4 (four) hours as needed for wheezing or shortness of breath.   Alcohol  Swabs (ALCOHOL  PADS) 70 % PADS 1 each by Does not apply route in the morning and at bedtime.   alendronate  (FOSAMAX ) 70 MG tablet Take 1 tablet (70 mg total) by mouth every 7 (seven) days. Take with a full glass of water on an empty stomach.   atorvastatin  (LIPITOR ) 20 MG tablet Take 1 tablet (20 mg total) by mouth daily.   benzonatate  (TESSALON ) 100 MG capsule Take 1 capsule (100 mg total) by mouth 3 (three) times daily  as needed for cough.   Calcium  Carbonate-Vitamin D  600-400 MG-UNIT tablet Take 1 tablet by mouth daily.   cephALEXin  (KEFLEX ) 500 MG capsule Take 1 capsule (500 mg total) by mouth 2 (two) times daily.   Cholecalciferol  25 MCG (1000 UT) capsule Take 1,000 Units by mouth daily.   clindamycin  (CLEOCIN ) 2 % vaginal cream Place 1 Applicatorful vaginally at bedtime.   Cyanocobalamin  (B-12) 1000 MCG CAPS Take 1,000 mcg by mouth daily.   dapagliflozin  propanediol (FARXIGA ) 10 MG TABS tablet Take 10 mg by mouth daily. Receives through [employer]Me patient assistance program   furosemide  (LASIX ) 20 MG tablet Take 1 tablet (20 mg total) by mouth daily.   insulin  degludec (TRESIBA  FLEXTOUCH) 100 UNIT/ML FlexTouch Pen Inject 28 Units into the skin daily. Patient receives via [employer] Patient Assistance through December 2024   Insulin  Pen Needle (TRUEPLUS PEN NEEDLES) 31G X 6 MM MISC Use with lantus  two times daily.   ketoconazole  (NIZORAL ) 2 % cream Apply 1 Application topically daily.   losartan  (COZAAR ) 25 MG tablet TAKE 1 TABLET BY MOUTH ONCE DAILY   metoprolol  succinate (TOPROL -XL) 50 MG 24 hr tablet  Take 1 tablet (50 mg total) by mouth daily.   montelukast  (SINGULAIR ) 10 MG tablet TAKE ONE TABLET BY MOUTH EVERYDAY AT BEDTIME   Multiple Vitamins-Minerals (PRESERVISION AREDS 2 PO) Take 1 tablet by mouth in the morning and at bedtime.   Omega-3 1000 MG CAPS Take 1,000 mg by mouth daily.   omeprazole  (PRILOSEC) 40 MG capsule Take 1 capsule (40 mg total) by mouth daily.   OneTouch Delica Lancets 33G MISC To check blood sugar three times daily  DX: E11.42   ONETOUCH VERIO test strip Use to check blood sugar three times daily. DX E11.42   oxybutynin  (DITROPAN -XL) 10 MG 24 hr tablet Take 1 tablet (10 mg total) by mouth at bedtime.   Semaglutide ,0.25 or 0.5MG /DOS, (OZEMPIC , 0.25 OR 0.5 MG/DOSE,) 2 MG/3ML SOPN Inject 0.5 mg into the skin once a week. Patient receives via [employer] Nordisk Patient Assistance through  December 2024   sertraline  (ZOLOFT ) 25 MG tablet TAKE 1 TABLET BY MOUTH ONCE DAILY   No facility-administered medications prior to visit.    Review of Systems All negative Except see HPI   {Insert previous labs (optional):23779} {See past labs  Heme  Chem  Endocrine  Serology  Results Review (optional):1}   Objective    There were no vitals taken for this visit. {Insert last BP/Wt (optional):23777}{See vitals history (optional):1}   Physical Exam   No results found for any visits on 06/29/23.      Assessment and Plan Assessment & Plan     No orders of the defined types were placed in this encounter.   No follow-ups on file.   The patient was advised to call back or seek an in-person evaluation if the symptoms worsen or if the condition fails to improve as anticipated.  I discussed the assessment and treatment plan with the patient. The patient was provided an opportunity to ask questions and all were answered. The patient agreed with the plan and demonstrated an understanding of the instructions.  I, Zahava Quant, PA-C have reviewed all documentation for this visit. The documentation on 06/29/2023  for the exam, diagnosis, procedures, and orders are all accurate and complete.  Blane Bunting, [facility], MMS [facility] [phone] (phone) [phone] (fax)  [facility]

## 2023-07-06 ENCOUNTER — Encounter: Payer: Medicare Other | Admitting: Family Medicine

## 2023-07-20 ENCOUNTER — Ambulatory Visit: Payer: Self-pay

## 2023-07-20 NOTE — Telephone Encounter (Signed)
  Chief Complaint: L knee pain/injury Symptoms: pain, swelling Frequency: 4 days ago after fall Pertinent Negatives: Patient denies abrasions, bruising Disposition: [] ED /[] Urgent Care (no appt availability in office) / [x] Appointment(In office/virtual)/ []  [place] [facility]/ [] Home Care/ [] Refused Recommended Disposition /[] [place] [facility]/ []  Follow-up with PCP Additional Notes: Pt states that she had a mechanical fall 4 days ago. Pt injured her knee during the fall. Pt states it is swollen, denies bruising. States worse with movement. Pt scheduled.  Copied from CRM [id number]. Topic: Clinical - Red Word Triage >> Jul 20, 2023  4:22 PM Rennis Case wrote: Red Word that prompted transfer to Nurse Triage:  Pt fell on sunday and hurt her knee. Does not feel like it is improving, patient having pain. Reason for Disposition  [1] Limp when walking AND [2] due to a twisted knee  Answer Assessment - Initial Assessment Questions 1. MECHANISM: "How did the injury happen?" (e.g., twisting injury, direct blow)      fell 2. ONSET: "When did the injury happen?" (Minutes or hours ago)      4 days ago 3. LOCATION: "Where is the injury located?"      L kneed 4. APPEARANCE of INJURY: "What does the injury look like?"      swollen 5. SEVERITY: "Can you put weight on that leg?" "Can you walk?"      Can put weight on leg but painful 6. SIZE: For cuts, bruises, or swelling, ask: "How large is it?" (e.g., inches or centimeters;  entire joint)      Denies cuts/abrasions 7. PAIN: "Is there pain?" If Yes, ask: "How bad is the pain?"  "What does it keep you from doing?" (e.g., Scale 1-10; or mild, moderate, severe)   -  NONE: (0): no pain   -  MILD (1-3): doesn't interfere with normal activities    -  MODERATE (4-7): interferes with normal activities (e.g., work or school) or awakens from sleep, limping    -  SEVERE (8-10): excruciating pain, unable to do any normal activities, unable to walk     8 8.  TETANUS: For any breaks in the skin, ask: "When was the last tetanus booster?"     unsure 9. OTHER SYMPTOMS: "Do you have any other symptoms?"  (e.g., "pop" when knee injured, swelling, locking, buckling)      Swelling,  Protocols used: Knee Injury-A-AH

## 2023-07-22 ENCOUNTER — Ambulatory Visit: Admitting: Physician Assistant

## 2023-07-22 VITALS — BP 107/64 | HR 74 | Resp 16

## 2023-07-22 DIAGNOSIS — M79662 Pain in left lower leg: Secondary | ICD-10-CM | POA: Diagnosis not present

## 2023-07-22 DIAGNOSIS — M25562 Pain in left knee: Secondary | ICD-10-CM | POA: Diagnosis not present

## 2023-07-22 DIAGNOSIS — M25462 Effusion, left knee: Secondary | ICD-10-CM

## 2023-07-22 NOTE — Progress Notes (Signed)
 Established patient visit  Patient: Andrea [name]   DOB: 03-20-1945   78 y.o. Female  MRN: [id number] Visit Date: 07/22/2023  Today's healthcare provider: Blane Bunting, PA-C   Chief Complaint  Patient presents with   Knee Pain    Lt knee pain fell (mother day)   Subjective      Discussed the use of AI scribe software for clinical note transcription with the patient, who gave verbal consent to proceed.  History of Present Illness Andrea [name] is a 78 year old female with coronary artery disease, heart failure, and diabetes who presents with knee pain and swelling following a recent fall. She is accompanied by her daughter.  She experienced knee pain and swelling after a fall on Mother's Day. While using her walker, she lost her balance tending to a flower pot and fell on her right side. Her daughter helped to break the fall, but she still sustained an injury. Pain is located under the knee and worsens with standing and weight-bearing, radiating with certain leg movements.  She has coronary artery disease, heart failure, peripheral artery disease, diabetes, and a previous mini stroke. She is not on blood thinners.  She smokes four to five cigarettes daily.  No chest pain, shortness of breath, rapid heart rate, vision problems, weakness, tingling, or numbness.       05/03/2023    9:59 AM 12/14/2022   11:02 AM 10/20/2022   11:08 AM  Depression screen PHQ 2/9  Decreased Interest 0 0 0  Down, Depressed, Hopeless 0 0 0  PHQ - 2 Score 0 0 0  Altered sleeping 0  0  Tired, decreased energy 0  0  Change in appetite 0  0  Feeling bad or failure about yourself  0  0  Trouble concentrating 0  0  Moving slowly or fidgety/restless 0  0  Suicidal thoughts 0  0  PHQ-9 Score 0  0  Difficult doing work/chores Not difficult at all  Not difficult at all      05/03/2023   10:00 AM 06/23/2021    2:52 PM  GAD 7 : Generalized Anxiety Score  Nervous, Anxious, on Edge 0 3  Control/stop  worrying 0 1  Worry too much - different things 0 2  Trouble relaxing 0 0  Restless 0 0  Easily annoyed or irritable 0 1  Afraid - awful might happen 0 1  Total GAD 7 Score 0 8  Anxiety Difficulty Not difficult at all Not difficult at all    Medications: Outpatient Medications Prior to Visit  Medication Sig   albuterol  (VENTOLIN  HFA) 108 (90 Base) MCG/ACT inhaler Inhale 2 puffs into the lungs every 4 (four) hours as needed for wheezing or shortness of breath.   Alcohol  Swabs (ALCOHOL  PADS) 70 % PADS 1 each by Does not apply route in the morning and at bedtime.   alendronate  (FOSAMAX ) 70 MG tablet Take 1 tablet (70 mg total) by mouth every 7 (seven) days. Take with a full glass of water on an empty stomach.   atorvastatin  (LIPITOR ) 20 MG tablet Take 1 tablet (20 mg total) by mouth daily.   benzonatate  (TESSALON ) 100 MG capsule Take 1 capsule (100 mg total) by mouth 3 (three) times daily as needed for cough.   Calcium  Carbonate-Vitamin D  600-400 MG-UNIT tablet Take 1 tablet by mouth daily.   cephALEXin  (KEFLEX ) 500 MG capsule Take 1 capsule (500 mg total) by mouth 2 (two) times daily.   Cholecalciferol  25  MCG (1000 UT) capsule Take 1,000 Units by mouth daily.   clindamycin  (CLEOCIN ) 2 % vaginal cream Place 1 Applicatorful vaginally at bedtime.   Cyanocobalamin  (B-12) 1000 MCG CAPS Take 1,000 mcg by mouth daily.   dapagliflozin  propanediol (FARXIGA ) 10 MG TABS tablet Take 10 mg by mouth daily. Receives through AZ&Me patient assistance program   furosemide  (LASIX ) 20 MG tablet Take 1 tablet (20 mg total) by mouth daily.   insulin  degludec (TRESIBA  FLEXTOUCH) 100 UNIT/ML FlexTouch Pen Inject 28 Units into the skin daily. Patient receives via [employer] Patient Assistance through December 2024   Insulin  Pen Needle (TRUEPLUS PEN NEEDLES) 31G X 6 MM MISC Use with lantus  two times daily.   ketoconazole  (NIZORAL ) 2 % cream Apply 1 Application topically daily.   losartan  (COZAAR ) 25 MG tablet TAKE  1 TABLET BY MOUTH ONCE DAILY   metoprolol  succinate (TOPROL -XL) 50 MG 24 hr tablet Take 1 tablet (50 mg total) by mouth daily.   montelukast  (SINGULAIR ) 10 MG tablet TAKE ONE TABLET BY MOUTH EVERYDAY AT BEDTIME   Multiple Vitamins-Minerals (PRESERVISION AREDS 2 PO) Take 1 tablet by mouth in the morning and at bedtime.   Omega-3 1000 MG CAPS Take 1,000 mg by mouth daily.   omeprazole  (PRILOSEC) 40 MG capsule Take 1 capsule (40 mg total) by mouth daily.   OneTouch Delica Lancets 33G MISC To check blood sugar three times daily  DX: E11.42   ONETOUCH VERIO test strip Use to check blood sugar three times daily. DX E11.42   oxybutynin  (DITROPAN -XL) 10 MG 24 hr tablet Take 1 tablet (10 mg total) by mouth at bedtime.   Semaglutide ,0.25 or 0.5MG /DOS, (OZEMPIC , 0.25 OR 0.5 MG/DOSE,) 2 MG/3ML SOPN Inject 0.5 mg into the skin once a week. Patient receives via Nov[employer] Nordisk Patient Assistance through December 2024   sertraline  (ZOLOFT ) 25 MG tablet TAKE 1 TABLET BY MOUTH ONCE DAILY   No facility-administered medications prior to visit.    Review of Systems All negative Except see HPI       Objective    BP 107/64 (BP Location: Right Arm, Patient Position: Sitting, Cuff Size: Normal)   Pulse 74   Resp 16   SpO2 98%     Physical Exam   No results found for any visits on 07/22/23.      Assessment and Plan Assessment & Plan  Knee pain and swelling Post-fall knee pain and swelling, differential includes fracture or soft tissue injury. Concern for DVT. - Order x-ray of the left knee to evaluate for fracture or injury. - Consider ultrasound for DVT to rule out clot formation. - Advise acetaminophen  for pain management. - Apply ice to the affected area. - Consider orthopedic referral based on x-ray findings. Hx of Hip fracture Sustained right hip fracture in three places, currently wheelchair-bound.  In the setting of Chronic heart failure Chronic heart failure with occasional crackles,  complicated by coronary artery disease, peripheral artery disease, and diabetes. - Monitor for symptom changes or heart failure exacerbation. - Consider further evaluation if symptoms worsen.  Coronary artery disease Coronary artery disease with arrhythmias and previous transient ischemic attack. No current chest pain or dyspnea. - Monitor for new symptoms such as chest pain or dyspnea.  Peripheral artery disease Peripheral artery disease with coronary artery disease and diabetes. Smoking contributes to disease progression. - Advise smoking cessation to mitigate risk factors.  Diabetes mellitus Diabetes mellitus with coronary artery disease and peripheral artery disease. Blood pressure and vitals are well-managed. -  Monitor blood glucose levels and manage diabetes per current treatment plan.  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 - Monitor for new neurological symptoms.   No orders of the defined types were placed in this encounter.   No follow-ups on file.   The patient was advised to call back or seek an in-person evaluation if the symptoms worsen or if the condition fails to improve as anticipated.  I discussed the assessment and treatment plan with the patient. The patient was provided an opportunity to ask questions and all were answered. The patient agreed with the plan and demonstrated an understanding of the instructions.  I, Mikiya Nebergall, PA-C have reviewed all documentation for this visit. The documentation on 07/22/2023  for the exam, diagnosis, procedures, and orders are all accurate and complete.  Blane Bunting, [facility], MMS [facility] [phone] (phone) [phone] (fax)  [facility]

## 2023-07-23 ENCOUNTER — Encounter: Payer: Self-pay | Admitting: Physician Assistant

## 2023-07-25 ENCOUNTER — Ambulatory Visit
Admission: RE | Admit: 2023-07-25 | Discharge: 2023-07-25 | Disposition: A | Discharge status: Home / Self Care | Attending: Physician Assistant | Admitting: Physician Assistant

## 2023-07-25 ENCOUNTER — Ambulatory Visit
Admission: RE | Admit: 2023-07-25 | Discharge: 2023-07-25 | Disposition: A | Discharge status: Home / Self Care | Source: Ambulatory Visit | Attending: Physician Assistant | Admitting: Physician Assistant

## 2023-07-25 DIAGNOSIS — M25562 Pain in left knee: Secondary | ICD-10-CM | POA: Insufficient documentation

## 2023-07-25 DIAGNOSIS — M25462 Effusion, left knee: Secondary | ICD-10-CM

## 2023-07-26 ENCOUNTER — Ambulatory Visit
Admission: RE | Admit: 2023-07-26 | Discharge: 2023-07-26 | Disposition: A | Discharge status: Home / Self Care | Source: Ambulatory Visit | Attending: Physician Assistant | Admitting: Physician Assistant

## 2023-07-26 DIAGNOSIS — M25462 Effusion, left knee: Secondary | ICD-10-CM

## 2023-07-26 DIAGNOSIS — M25562 Pain in left knee: Secondary | ICD-10-CM

## 2023-07-27 ENCOUNTER — Ambulatory Visit: Payer: Self-pay | Admitting: Physician Assistant

## 2023-07-27 ENCOUNTER — Other Ambulatory Visit: Payer: Medicare Other | Admitting: Pharmacist

## 2023-07-27 DIAGNOSIS — E1142 Type 2 diabetes mellitus with diabetic polyneuropathy: Secondary | ICD-10-CM

## 2023-07-27 DIAGNOSIS — I5022 Chronic systolic (congestive) heart failure: Secondary | ICD-10-CM

## 2023-07-27 NOTE — Progress Notes (Signed)
 07/27/2023 Name: Andrea [name] MRN: [id number] DOB: [birth year]  Chief Complaint  Patient presents with   Medication Management    Andrea [name] is a 78 y.o. year old female who presented for a telephone visit.   They were referred to the pharmacist by their PCP for assistance in managing diabetes and medication access.      Subjective:   Care Team: Primary Care Provider: Ostwalt, Janna, PA-C; Next Scheduled Visit: 08/04/2023  Cardiologist: Devorah Fonder, MD Nurse Care Manager: Roxie Cord, RN  Medication Access/Adherence  Current Pharmacy:  [facility] [place]  [place]uc[place], [place] - [address] [place] [address] Phone: [phone] Fax: [phone]  [id number] - [place], [place] - [address] [facility] [address] [place] [address] Phone: [phone] Fax: [phone]  [facility] [address], [place] - [address] [place] [address] Phone: [phone] Fax: [phone]   Patient reports affordability concerns with their medications: No  Patient reports access/transportation concerns to their pharmacy: No  Patient reports adherence concerns with their medications:  No     Patient uses weekly pillbox (transfers from pill pack to pillbox to incorporate)   From review of chart/patient shares that she was recently seen by PCP on 5/16 related to a fall on Mother's Day where she injured her left leg. Reports has since completed a x-ray and ultrasound of the leg and awaiting a call back from provider. Plans to see provider again next week for further follow up - Denies any dizziness or lightheadedness with fall. Attributes to taking her hand off of her walker as she was reaching to adjust a flower pot and lost her balance   Diabetes:   Current medications:  - Farxiga  10 mg daily each morning - Tresiba  28 units daily - Ozempic  0.5 mg weekly on  Wednesdays     Current morning fasting readings ranging: morning fasting 80-90s  Using One Touch Verio meter   Patient denies hypoglycemic s/sx including dizziness, shakiness, sweating.  - Reports carries glucose tablets with her   Statin therapy: atorvastatin  20 mg daily   Current medication access support: Enrolled in patient assistance program for Tresiba  and Ozempic  from [employer] and enrollment for Farxiga  patient assistance from AZ&Me through 03/07/2024     Hypertension/Heart Failure:   Current medications:  ACEi/ARB/ARNI: Losartan  25 mg daily SGLT2i: Farxiga  10 mg daily each morning Beta blocker: Metoprolol  ER 50 mg daily Diuretic regimen: Furosemide  20 mg daily    Medications previously tried: Jardiance  (Yeast Infection)    Reports has a home upper arm home BP cuff, but denies checking home BP recently   Patient denies hypotensive s/sx including dizziness, lightheadedness.   Note patient walks with her walker   Objective:  Lab Results  Component Value Date   HGBA1C 7.1 (H) 05/23/2023    Lab Results  Component Value Date   CREATININE 1.31 (H) 05/23/2023   BUN 26 05/23/2023   NA 138 05/23/2023   K 4.8 05/23/2023   CL 101 05/23/2023   CO2 20 05/23/2023    Lab Results  Component Value Date   CHOL 127 05/23/2023   HDL 62 05/23/2023   LDLCALC 43 05/23/2023   TRIG 124 05/23/2023   CHOLHDL 2.0 05/23/2023   BP Readings from Last 3 Encounters:  07/22/23 107/64  05/17/23 (!) 113/49  05/03/23 122/67   Pulse Readings from Last 3 Encounters:  07/22/23 74  05/17/23 78  05/03/23 96     Medications Reviewed Today     Reviewed by Ardis Becton, RPH-CPP (Pharmacist) on 07/27/23 at 1058  Med List Status: <None>   Medication Order Taking? Sig Documenting Provider Last Dose Status Informant  albuterol  (VENTOLIN  HFA) 108 (90 Base) MCG/ACT inhaler [id number]  Inhale 2 puffs into the lungs every 4 (four) hours as needed for wheezing or shortness of  breath. Tapia, Leisa, PA-C  Active   Alcohol  Swabs (ALCOHOL  PADS) 70 % PADS [id number]  1 each by Does not apply route in the morning and at bedtime. Normie Becton, FNP  Active   alendronate  (FOSAMAX ) 70 MG tablet [id number]  Take 1 tablet (70 mg total) by mouth every 7 (seven) days. Take with a full glass of water on an empty stomach. Normie Becton, FNP  Active   atorvastatin  (LIPITOR ) 20 MG tablet [id number]  Take 1 tablet (20 mg total) by mouth daily. Normie Becton, FNP  Active   Calcium  Carbonate-Vitamin D  600-400 MG-UNIT tablet [id number]  Take 1 tablet by mouth daily. [provider]  Active Nursing Home Medication Administration Guide (MAG)  Cholecalciferol  25 MCG (1000 UT) capsule [id number]  Take 1,000 Units by mouth daily. [provider]  Active   clindamycin  (CLEOCIN ) 2 % vaginal cream [id number]  Place 1 Applicatorful vaginally at bedtime. Ostwalt, Janna, PA-C  Active   Cyanocobalamin  (B-12) 1000 MCG CAPS [id number]  Take 1,000 mcg by mouth daily. [provider]  Active Nursing Home Medication Administration Guide (MAG)  dapagliflozin  propanediol (FARXIGA ) 10 MG TABS tablet [id number] Yes Take 10 mg by mouth daily. Receives through AZ&Me patient assistance program [provider] Taking Active   furosemide  (LASIX ) 20 MG tablet [id number] Yes Take 1 tablet (20 mg total) by mouth daily. Ostwalt, Janna, PA-C Taking Active   insulin  degludec (TRESIBA  FLEXTOUCH) 100 UNIT/ML FlexTouch Pen [id number] Yes Inject 28 Units into the skin daily. Patient receives via Novo Nordisk Patient Assistance through December 2024 Normie Becton, FNP Taking Active   Insulin  Pen Needle (TRUEPLUS PEN NEEDLES) 31G X 6 MM MISC [id number]  Use with lantus  two times daily. Bacigalupo, Angela M, MD  Active Nursing Home Medication Administration Guide (MAG)  ketoconazole  (NIZORAL ) 2 % cream [id number]  Apply 1 Application topically daily. Ostwalt, Janna, PA-C  Active   losartan  (COZAAR ) 25 MG  tablet [id number] Yes TAKE 1 TABLET BY MOUTH ONCE DAILY Normie Becton, FNP Taking Active   metoprolol  succinate (TOPROL -XL) 50 MG 24 hr tablet [id number] Yes Take 1 tablet (50 mg total) by mouth daily. Iona Manis T, FNP Taking Active   montelukast  (SINGULAIR ) 10 MG tablet [id number]  TAKE ONE TABLET BY MOUTH EVERYDAY AT BEDTIME Normie Becton, FNP  Active   Multiple Vitamins-Minerals (PRESERVISION AREDS 2 PO) [id number]  Take 1 tablet by mouth in the morning and at bedtime. [provider]  Active Nursing Home Medication Administration Guide (MAG)  Omega-3 1000 MG CAPS [id number]  Take 1,000 mg by mouth daily. [provider]  Active Nursing Home Medication Administration Guide (MAG)  omeprazole  (PRILOSEC) 40 MG capsule [id number]  Take 1 capsule (40 mg total) by mouth daily. Ostwalt, Janna, PA-C  Active   OneTouch Delica Lancets 33G [place] [id number]  To check blood sugar three times daily  DX: E11.42 Normie Becton, FNP  Active   [facility] VERIO test strip [id number]  Use to check blood sugar three times daily. DX N36.14 Normie Becton, FNP  Active   oxybutynin  (DITROPAN -XL) 10 MG 24 hr tablet [id number]  Take 1 tablet (10 mg total) by mouth at bedtime. Ostwalt, Janna, PA-C  Active   Semaglutide ,0.25 or 0.5MG /DOS, (OZEMPIC , 0.25 OR 0.5 MG/DOSE,) 2 MG/3ML SOPN [id number] Yes Inject 0.5 mg into the skin once a week. Patient receives via Novo Nordisk Patient Assistance through December 2024 Normie Becton, FNP Taking Active            Med Note Dessa Floss, [place] Dec 14, 2022 10:16 AM) Reports taking the 0.5mg  dose  sertraline  (ZOLOFT ) 25 MG tablet [id number]  TAKE 1 TABLET BY MOUTH ONCE DAILY Normie Becton, FNP  Active   Med List Note Thomasena Fleming, CPhT 12/18/20 1047): [employer] [phone]              Assessment/Plan:   Diabetes: - Reviewed goal A1c, goal fasting, and goal 2 hour post prandial glucose - Reviewed dietary modifications including importance of  having regular well-balanced meals while controlling carbohydrate portion sizes  Advise patient against skipping meals - Recommend to check glucose, keep log of results and have this record to review during future medical appointments - Patient has denied interest in using continuous glucose monitoring - Patient to contact AZ&Me as needed for refills of Farxiga  and Novo Nordisk as needed for refills of Tresiba  and Ozempic      Hypertension/Heart Failure: - Encourage patient to restart checking home blood pressure and heart rate, keep log of results and have this record to review during future medical appointments       Follow Up Plan: Clinical Pharmacist will follow up with patient by telephone on 01/11/2024 at 11:00 AM       Arthur Lash, PharmD, [facility] [phone]

## 2023-07-27 NOTE — Telephone Encounter (Signed)
 Copied from CRM [id number]. Topic: Clinical - Lab/Test Results >> Jul 27, 2023  9:52 AM Baldomero Bone wrote: Reason for CRM: Patient calling in for results of CT that was done yesterday. Callback number is [phone]

## 2023-07-27 NOTE — Patient Instructions (Signed)
 Goals Addressed             This Visit's Progress    Pharmacy Goals       If you need to reach out to patient assistance programs regarding refills or to find out the status of your application, you can do so by calling:  [employer] at [phone] [employer] at [phone]  Our goal A1c is less than 7%. This corresponds with fasting sugars less than 130 and 2 hour after meal sugars less than 180. Please keep a log of your results when checking your blood sugar   Our goal bad cholesterol, or LDL, is less than 70. This is why it is important to continue taking your atorvastatin.  Check your blood pressure twice weekly, and any time you have concerning symptoms like headache, chest pain, dizziness, shortness of breath, or vision changes.   Our goal is less than 130/80.  To appropriately check your blood pressure, make sure you do the following:  1) Avoid caffeine, exercise, or tobacco products for 30 minutes before checking. Empty your bladder. 2) Sit with your back supported in a flat-backed chair. Rest your arm on something flat (arm of the chair, table, etc). 3) Sit still with your feet flat on the floor, resting, for at least 5 minutes.  4) Check your blood pressure. Take 1-2 readings.  5) Write down these readings and bring with you to any provider appointments.  Bring your home blood pressure machine with you to a provider's office for accuracy comparison at least once a year.   Make sure you take your blood pressure medications before you come to any office visit, even if you were asked to fast for labs.  Estelle Grumbles, PharmD, [facility] [phone]

## 2023-07-28 ENCOUNTER — Other Ambulatory Visit: Payer: Self-pay | Admitting: Physician Assistant

## 2023-07-28 ENCOUNTER — Ambulatory Visit: Admitting: Physician Assistant

## 2023-07-28 DIAGNOSIS — E785 Hyperlipidemia, unspecified: Secondary | ICD-10-CM

## 2023-07-28 NOTE — Telephone Encounter (Signed)
 Copied from CRM [id number]. Topic: Clinical - Medication Refill >> Jul 28, 2023  3:17 PM Donald Frost wrote: Medication: atorvastatin  (LIPITOR ) 20 MG tablet  Has the patient contacted their pharmacy? Yes   This is the patient's preferred pharmacy:  [facility] [place]  Dorrene Gauc[place], [place] - [address] [place] [address] Phone: [phone] Fax: [phone]   Is this the correct pharmacy for this prescription? Yes If no, delete pharmacy and type the correct one.   Has the prescription been filled recently? No  Is the patient out of the medication? No  Has the patient been seen for an appointment in the last year OR does the patient have an upcoming appointment? Yes  Can we respond through MyChart? Yes  Please assist patient further

## 2023-07-28 NOTE — Addendum Note (Signed)
 Addended by: Estill Hemming on: 07/28/2023 03:36 PM   Modules accepted: Orders

## 2023-07-29 MED ORDER — ATORVASTATIN CALCIUM 20 MG PO TABS: 20.0000 mg | ORAL_TABLET | Freq: Every day | ORAL | 2 refills | Status: AC

## 2023-07-29 NOTE — Telephone Encounter (Signed)
 Requested Prescriptions  Pending Prescriptions Disp Refills   atorvastatin  (LIPITOR ) 20 MG tablet 90 tablet 1    Sig: Take 1 tablet (20 mg total) by mouth daily.     Cardiovascular:  Antilipid - Statins Failed - 07/29/2023  3:57 PM      Failed - Lipid Panel in normal range within the last 12 months    Cholesterol, Total  Date Value Ref Range Status  05/23/2023 127 100 - 199 mg/dL Final   LDL Cholesterol (Calc)  Date Value Ref Range Status  12/16/2016 56 mg/dL (calc) Final    Comment:    Reference range: <100 . Desirable range <100 mg/dL for primary prevention;   <70 mg/dL for patients with CHD or diabetic patients  with > or = 2 CHD risk factors. Aaron Aas LDL-C is now calculated using the Martin-Hopkins  calculation, which is a validated novel method providing  better accuracy than the Friedewald equation in the  estimation of LDL-C.  Melinda Sprawls et al. Erroll Heard. 4098;119(14): 2061-2068  (http://education.[facility].com/faq/FAQ164)    LDL Chol Calc (NIH)  Date Value Ref Range Status  05/23/2023 43 0 - 99 mg/dL Final   HDL  Date Value Ref Range Status  05/23/2023 62 >39 mg/dL Final   Triglycerides  Date Value Ref Range Status  05/23/2023 124 0 - 149 mg/dL Final         Passed - Patient is not pregnant      Passed - Valid encounter within last 12 months    Recent Outpatient Visits           1 week ago Acute pain of left knee   [facility] [place], Janna, PA-C   2 months ago Diabetes mellitus due to underlying condition with diabetic autonomic neuropathy, unspecified whether long term insulin  use [facility])   [place] [place], [place], PA-C   2 months ago Urinary urgency   [facility] [place], [place], PA-C   3 months ago Acute cough   [facility] Adeline Hone, PA-C

## 2023-08-04 ENCOUNTER — Ambulatory Visit (INDEPENDENT_AMBULATORY_CARE_PROVIDER_SITE_OTHER): Admitting: Physician Assistant

## 2023-08-04 ENCOUNTER — Encounter: Payer: Self-pay | Admitting: Physician Assistant

## 2023-08-04 VITALS — BP 117/72 | HR 94 | Resp 16 | Ht <= 58 in | Wt 134.0 lb

## 2023-08-04 DIAGNOSIS — I739 Peripheral vascular disease, unspecified: Secondary | ICD-10-CM | POA: Diagnosis not present

## 2023-08-04 DIAGNOSIS — E0843 Diabetes mellitus due to underlying condition with diabetic autonomic (poly)neuropathy: Secondary | ICD-10-CM | POA: Diagnosis not present

## 2023-08-04 DIAGNOSIS — M25562 Pain in left knee: Secondary | ICD-10-CM | POA: Diagnosis not present

## 2023-08-04 DIAGNOSIS — K219 Gastro-esophageal reflux disease without esophagitis: Secondary | ICD-10-CM

## 2023-08-04 DIAGNOSIS — I2510 Atherosclerotic heart disease of native coronary artery without angina pectoris: Secondary | ICD-10-CM

## 2023-08-04 NOTE — Progress Notes (Signed)
 Established patient visit  Patient: Andrea [name]   DOB: 09/05/1945   78 y.o. Female  MRN: [id number] Visit Date: 08/04/2023  Today's healthcare provider: Blane Bunting, PA-C   Chief Complaint  Patient presents with   Follow-up    5 wk f/u    Subjective     HPI     Follow-up    Additional comments: 5 wk f/u       Last edited by Estill Hemming, CMA on 08/04/2023  1:20 PM.       Discussed the use of AI scribe software for clinical note transcription with the patient, who gave verbal consent to proceed.  History of Present Illness Andrea [name] is a 78 year old female with knee pain and peripheral artery disease who presents with persistent knee pain and swelling.  She experiences persistent knee pain and swelling, with some reduction in swelling, but the knee remains tight and painful. She has peripheral artery disease and coronary artery disease, with ongoing shortness of breath and occasional chest pain. Her heart rate is stable. Her diabetes is managed with Farxiga , Tresiba , and Ozempic , with stable blood sugar levels, including a reading of 93 this morning. She takes atorvastatin , furosemide , losartan , and sertraline , with well-controlled depression. She also takes alendronate  weekly. She smokes three to four cigarettes daily.       08/04/2023    1:34 PM 05/03/2023    9:59 AM 12/14/2022   11:02 AM  Depression screen PHQ 2/9  Decreased Interest 0 0 0  Down, Depressed, Hopeless 0 0 0  PHQ - 2 Score 0 0 0  Altered sleeping 0 0   Tired, decreased energy 0 0   Change in appetite 0 0   Feeling bad or failure about yourself  0 0   Trouble concentrating 0 0   Moving slowly or fidgety/restless 0 0   Suicidal thoughts 0 0   PHQ-9 Score 0 0   Difficult doing work/chores Not difficult at all Not difficult at all       05/03/2023   10:00 AM 06/23/2021    2:52 PM  GAD 7 : Generalized Anxiety Score  Nervous, Anxious, on Edge 0 3  Control/stop worrying 0 1  Worry too  much - different things 0 2  Trouble relaxing 0 0  Restless 0 0  Easily annoyed or irritable 0 1  Afraid - awful might happen 0 1  Total GAD 7 Score 0 8  Anxiety Difficulty Not difficult at all Not difficult at all    Medications: Outpatient Medications Prior to Visit  Medication Sig   albuterol  (VENTOLIN  HFA) 108 (90 Base) MCG/ACT inhaler Inhale 2 puffs into the lungs every 4 (four) hours as needed for wheezing or shortness of breath.   Alcohol  Swabs (ALCOHOL  PADS) 70 % PADS 1 each by Does not apply route in the morning and at bedtime.   alendronate  (FOSAMAX ) 70 MG tablet Take 1 tablet (70 mg total) by mouth every 7 (seven) days. Take with a full glass of water on an empty stomach.   atorvastatin  (LIPITOR ) 20 MG tablet Take 1 tablet (20 mg total) by mouth daily.   Calcium  Carbonate-Vitamin D  600-400 MG-UNIT tablet Take 1 tablet by mouth daily.   Cholecalciferol  25 MCG (1000 UT) capsule Take 1,000 Units by mouth daily.   clindamycin  (CLEOCIN ) 2 % vaginal cream Place 1 Applicatorful vaginally at bedtime.   Cyanocobalamin  (B-12) 1000 MCG CAPS Take 1,000 mcg by mouth daily.   dapagliflozin   propanediol (FARXIGA ) 10 MG TABS tablet Take 10 mg by mouth daily. Receives through [place] patient [place]   furosemide  (LASIX ) 20 MG tablet Take 1 tablet (20 mg total) by mouth daily.   insulin  degludec (TRESIBA  FLEXTOUCH) 100 UNIT/ML FlexTouch Pen Inject 28 Units into the skin daily. Patient receives via [employer] Patient Assistance through December 2024   Insulin  Pen Needle (TRUEPLUS PEN NEEDLES) 31G X 6 MM MISC Use with lantus  two times daily.   ketoconazole  (NIZORAL ) 2 % cream Apply 1 Application topically daily.   losartan  (COZAAR ) 25 MG tablet TAKE 1 TABLET BY MOUTH ONCE DAILY   metoprolol  succinate (TOPROL -XL) 50 MG 24 hr tablet Take 1 tablet (50 mg total) by mouth daily.   montelukast  (SINGULAIR ) 10 MG tablet TAKE ONE TABLET BY MOUTH EVERYDAY AT BEDTIME   Multiple Vitamins-Minerals  (PRESERVISION AREDS 2 PO) Take 1 tablet by mouth in the morning and at bedtime.   Omega-3 1000 MG CAPS Take 1,000 mg by mouth daily.   omeprazole  (PRILOSEC) 40 MG capsule Take 1 capsule (40 mg total) by mouth daily.   OneTouch Delica Lancets 33G MISC To check blood sugar three times daily  DX: E11.42   ONETOUCH VERIO test strip Use to check blood sugar three times daily. DX E11.42   oxybutynin  (DITROPAN -XL) 10 MG 24 hr tablet Take 1 tablet (10 mg total) by mouth at bedtime.   Semaglutide ,0.25 or 0.5MG /DOS, (OZEMPIC , 0.25 OR 0.5 MG/DOSE,) 2 MG/3ML SOPN Inject 0.5 mg into the skin once a week. Patient receives via [employer] Patient Assistance through December 2024   sertraline  (ZOLOFT ) 25 MG tablet TAKE 1 TABLET BY MOUTH ONCE DAILY   No facility-administered medications prior to visit.    Review of Systems All negative Except see HPI       Objective    BP 117/72 (BP Location: Right Arm, Patient Position: Sitting, Cuff Size: Normal)   Pulse 94   Resp 16   Ht 4\' 7"  (1.397 m)   Wt 134 lb (60.8 kg)   SpO2 97%   BMI 31.14 kg/m     Physical Exam Vitals reviewed.  Constitutional:      General: She is not in acute distress.    Appearance: Normal appearance. She is well-developed. She is not diaphoretic.  HENT:     Head: Normocephalic and atraumatic.  Eyes:     General: No scleral icterus.    Conjunctiva/sclera: Conjunctivae normal.  Neck:     Thyroid : No thyromegaly.  Cardiovascular:     Rate and Rhythm: Normal rate and regular rhythm.     Pulses: Normal pulses.     Heart sounds: Normal heart sounds. No murmur heard. Pulmonary:     Effort: Pulmonary effort is normal. No respiratory distress.     Breath sounds: Normal breath sounds. No wheezing, rhonchi or rales.  Musculoskeletal:     Cervical back: Neck supple.     Right lower leg: No edema.     Left lower leg: No edema.  Lymphadenopathy:     Cervical: No cervical adenopathy.  Skin:    General: Skin is warm and dry.      Findings: No rash.  Neurological:     Mental Status: She is alert and oriented to person, place, and time. Mental status is at baseline.  Psychiatric:        Mood and Affect: Mood normal.        Behavior: Behavior normal.    Ambulates with wheelchair  No  results found for any visits on 08/04/23.      Assessment and Plan Assessment & Plan Knee pain and swelling Persistent knee pain with reduced swelling, ongoing tightness. Referral to orthopedics for further evaluation and management suggested. - Refer to orthopedics for knee evaluation. - Consider MRI if recommended by orthopedics. - Suggest [facility] for walk-in evaluation.  Peripheral artery disease Continued risk due to smoking. Emphasized smoking cessation to reduce risk of complications. - Encourage smoking cessation using resources from the Quit Smoking site.  Coronary artery disease Well-managed condition. Discussed smoking cessation to reduce cardiovascular risks. - Encourage smoking cessation using resources from the Quit Smoking site.  Type 2 diabetes mellitus Chronic, A1C 7.1 Blood sugar levels well-controlled with current medication regimen. Emphasized regular, small meals to maintain stable blood sugar levels. - Continue current diabetes medications: Farxiga , Tresiba , and Ozempic . - Encourage regular, small meals throughout the day. - Discuss dietary strategies for stable blood sugar levels. GFR of 42 from 3/20225  Osteoporosis Well-managed on current treatment with alendronate . - Continue alendronate  for osteoporosis management.  Depression Chronic    08/04/2023    1:34 PM 05/03/2023    9:59 AM 12/14/2022   11:02 AM  PHQ9 SCORE ONLY  PHQ-9 Total Score 0 0 0   Well-controlled on current medication regimen with sertraline . - Continue sertraline  for depression management. Will follow-up  Rechecked labs  No orders of the defined types were placed in this encounter.   No follow-ups on file.    The patient was advised to call back or seek an in-person evaluation if the symptoms worsen or if the condition fails to improve as anticipated.  I discussed the assessment and treatment plan with the patient. The patient was provided an opportunity to ask questions and all were answered. The patient agreed with the plan and demonstrated an understanding of the instructions.  I, Anye Brose, PA-C have reviewed all documentation for this visit. The documentation on 08/04/2023  for the exam, diagnosis, procedures, and orders are all accurate and complete.  Blane Bunting, [facility], MMS [facility] [phone] (phone) [phone] (fax)  [facility]

## 2023-08-17 ENCOUNTER — Ambulatory Visit (INDEPENDENT_AMBULATORY_CARE_PROVIDER_SITE_OTHER): Admitting: Orthopedic Surgery

## 2023-08-17 ENCOUNTER — Encounter: Payer: Self-pay | Admitting: Orthopedic Surgery

## 2023-08-17 VITALS — BP 114/65 | Ht <= 58 in | Wt 126.0 lb

## 2023-08-17 DIAGNOSIS — M25562 Pain in left knee: Secondary | ICD-10-CM

## 2023-08-17 NOTE — Progress Notes (Signed)
 New Patient Visit  Assessment: Andrea [name] is a 78 y.o. female with the following: Right knee pain; recent fall  Plan: SYNDI PUA fell about a month ago.  She primarily landed on her right side, but continues to have pain in the area of the left knee.  Radiographs are negative.  She does have osteopenia, with obvious fracture.  She has some diffuse swelling, without bruising.  I do think this will continue to improve.  We discussed an injection for the left knee, but she is not interested.  Continue with protected weightbearing.  Tylenol  and/or ibuprofen as needed.  Can also ice the knee.  Provided reassurance.  If she continues to have difficulty, we can [place] consider a referral to physical therapy.  We can also discuss an injection in more detail.  Once again, she is not interested in an injection at this time.  If her pain does not improve, I would plan to obtain updated x-rays.  She will follow-up as needed.  Follow-up: No follow-ups on file.  Subjective:  Chief Complaint  Patient presents with   Knee Pain    She fell on mothers day  it is sore and tight around the knee to the calf    History of Present Illness: Andrea [name] is a 78 y.o. female who has been referred by  Janna Ostwalt, PA-C for evaluation of left knee pain.  She lost her balance about a month ago.  She fell to the ground, landing on her right side.  She started to have pain in the left knee.  Radiographs have been obtained, and are negative for acute injury.  She uses a walker to assist with ambulation.  She was using a walker at the time she fell.  However, she was not holding onto the walker, and was checking on some plants at home.  Since then, she has continued to have pain and soreness around the left knee.  It extends into the lower leg.  She notes swelling, as well as tightness.  She takes Tylenol  and ibuprofen occasionally.  No other treatments thus far.   Review of Systems: No fevers or  chills No numbness or tingling No chest pain No shortness of breath No bowel or bladder dysfunction No GI distress No headaches   Medical History:  Past Medical History:  Diagnosis Date   Anxiety    Arthritis    CAD (coronary artery disease)    Chickenpox    Chronic systolic heart failure (HCC)    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 (HCC) 12/10/2021   COPD (chronic obstructive pulmonary disease) (HCC)    mild-no inhalers   Coronary artery disease    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   Depression    Diabetes mellitus type 2, uncomplicated (HCC)    Diabetic retinopathy (HCC)    Dupuytren contracture 2011   s/p surgery to LEFT hand   Frequent urinary tract infections    GERD (gastroesophageal reflux disease)    Grade I diastolic dysfunction    HTN (hypertension)    Hyperlipidemia, unspecified    Irritable bowel syndrome    Meningitis 11/2019   MI (myocardial infarction) (HCC) 1994   no stent   MRSA (methicillin resistant staph aureus) culture positive    Neuromuscular disorder (HCC)    nerve pain in back and legs   Osteoporosis    Pneumonia 2021   RBBB    Sepsis (HCC) 11/2019   Skin cancer  Skin tear of left elbow without complication 10/20/2022   TIA (transient ischemic attack) 2014   Vitamin D  deficiency, unspecified    Wheezing     Past Surgical History:  Procedure Laterality Date   BACK SURGERY     lower due to spinal stenosis   BONE BIOPSY Left 11/20/2020   Procedure: BONE BIOPSY;  Surgeon: Dot Gazella, DPM;  Location: [facility] OR[facility];  Service: Podiatry;  Laterality: Left;   CARDIAC CATHETERIZATION  1994   CATARACT EXTRACTION, BILATERAL Bilateral    COLONOSCOPY     x3   COLONOSCOPY WITH PROPOFOL  N/A 02/08/2019   Procedure: COLONOSCOPY WITH PROPOFOL ;  Surgeon: Luke Salaam, MD;  [facility];  Service: Gastroenterology;  Laterality: N/A;   CORONARY ANGIOPLASTY  1994   DUPUYTREN CONTRACTURE RELEASE Left 2011   HARDWARE REMOVAL Left 08/19/2020    Procedure: HARDWARE REMOVAL;  Surgeon: Molli Angelucci, MD;  Location: [facility] OR[facility];  Service: Orthopedics;  Laterality: Left;   HIP FRACTURE SURGERY  11/2013   IRRIGATION AND DEBRIDEMENT FOOT Left 11/20/2020   Procedure: IRRIGATION AND DEBRIDEMENT FOOT-Heel Ulcer;  Surgeon: Dot Gazella, DPM;  Location: [facility] OR[facility];  Service: Podiatry;  Laterality: Left;   PR COLSC FLX W/REMOVAL LESION BY HOT BX FORCEPS   08/21/2015   Procedure: COLONOSCOPY, FLEXIBLE, PROXIMAL TO SPLENIC FLEXURE; W/REMOVAL TUMOR/POLYP/OTHER LESION, HOT BX FORCEP/CAUTE; Surgeon: Linden Revels, MD; Location: [facility]; Service: General Surgery   REPAIR DURAL / CSF LEAK     to left sinus   TEE WITHOUT CARDIOVERSION N/A 11/20/2019   Procedure: TRANSESOPHAGEAL ECHOCARDIOGRAM (TEE);  Surgeon: Sammy Crisp, MD;  Location: [facility];  Service: Cardiovascular;  Laterality: N/A;   TOTAL HIP ARTHROPLASTY Left 08/19/2020   Procedure: TOTAL HIP ARTHROPLASTY ANTERIOR APPROACH;  Surgeon: Molli Angelucci, MD;  Location: [facility] OR[facility];  Service: Orthopedics;  Laterality: Left;    Family History  Problem Relation Age of Onset   Depression Daughter    Arthritis Daughter        spine   Plantar fasciitis Daughter    Anxiety disorder Daughter    Hyperlipidemia Daughter    AAA (abdominal aortic aneurysm) Daughter    Hypertension Mother    Stroke Mother    Cataracts Mother    Depression Mother    Heart disease Mother    Asthma Brother    Diabetes Daughter    Hyperlipidemia Daughter    Hypertension Daughter    Breast cancer Cousin        maternal   Social History   Tobacco Use   Smoking status: Every Day    Current packs/day: 0.00    Average packs/day: 0.5 packs/day for 30.0 years (15.0 ttl pk-yrs)    Types: Cigarettes    Start date: 07/18/1982    Last attempt to quit: 07/17/2012    Years since quitting: 11.0   Smokeless tobacco: Never   Tobacco comments:     Currently smoking a pack every three days  Vaping Use   Vaping  status: Never Used  Substance Use Topics   Alcohol  use: Not Currently    Alcohol /week: 0.0 standard drinks of alcohol    Drug use: No    Allergies  Allergen Reactions   Chlorhexidine     Metformin And Related Diarrhea   Sulfa Antibiotics Rash    Current Meds  Medication Sig   albuterol  (VENTOLIN  HFA) 108 (90 Base) MCG/ACT inhaler Inhale 2 puffs into the lungs every 4 (four) hours as needed for wheezing or shortness of breath.  Alcohol  Swabs (ALCOHOL  PADS) 70 % PADS 1 each by Does not apply route in the morning and at bedtime.   alendronate  (FOSAMAX ) 70 MG tablet Take 1 tablet (70 mg total) by mouth every 7 (seven) days. Take with a full glass of water on an empty stomach.   atorvastatin  (LIPITOR ) 20 MG tablet Take 1 tablet (20 mg total) by mouth daily.   Calcium  Carbonate-Vitamin D  600-400 MG-UNIT tablet Take 1 tablet by mouth daily.   Cholecalciferol  25 MCG (1000 UT) capsule Take 1,000 Units by mouth daily.   clindamycin  (CLEOCIN ) 2 % vaginal cream Place 1 Applicatorful vaginally at bedtime.   Cyanocobalamin  (B-12) 1000 MCG CAPS Take 1,000 mcg by mouth daily.   dapagliflozin  propanediol (FARXIGA ) 10 MG TABS tablet Take 10 mg by mouth daily. Receives through [place] patient assistance program   furosemide  (LASIX ) 20 MG tablet Take 1 tablet (20 mg total) by mouth daily.   insulin  degludec (TRESIBA  FLEXTOUCH) 100 UNIT/ML FlexTouch Pen Inject 28 Units into the skin daily. Patient receives via [employer] Patient Assistance through December 2024   Insulin  Pen Needle (TRUEPLUS PEN NEEDLES) 31G X 6 MM MISC Use with lantus  two times daily.   ketoconazole  (NIZORAL ) 2 % cream Apply 1 Application topically daily.   losartan  (COZAAR ) 25 MG tablet TAKE 1 TABLET BY MOUTH ONCE DAILY   metoprolol  succinate (TOPROL -XL) 50 MG 24 hr tablet Take 1 tablet (50 mg total) by mouth daily.   montelukast  (SINGULAIR ) 10 MG tablet TAKE ONE TABLET BY MOUTH EVERYDAY AT BEDTIME   Multiple Vitamins-Minerals  (PRESERVISION AREDS 2 PO) Take 1 tablet by mouth in the morning and at bedtime.   Omega-3 1000 MG CAPS Take 1,000 mg by mouth daily.   omeprazole  (PRILOSEC) 40 MG capsule Take 1 capsule (40 mg total) by mouth daily.   OneTouch Delica Lancets 33G MISC To check blood sugar three times daily  DX: E11.42   ONETOUCH VERIO test strip Use to check blood sugar three times daily. DX E11.42   oxybutynin  (DITROPAN -XL) 10 MG 24 hr tablet Take 1 tablet (10 mg total) by mouth at bedtime.   Semaglutide ,0.25 or 0.5MG /DOS, (OZEMPIC , 0.25 OR 0.5 MG/DOSE,) 2 MG/3ML SOPN Inject 0.5 mg into the skin once a week. Patient receives via [employer] Patient Assistance through December 2024   sertraline  (ZOLOFT ) 25 MG tablet TAKE 1 TABLET BY MOUTH ONCE DAILY    Objective: BP 114/65   Ht 4' 7 (1.397 m)   Wt 126 lb (57.2 kg)   BMI 29.29 kg/m   Physical Exam:  General: Elderly female., Alert and oriented., and No acute distress. Gait: Ambulates with the assistance of a walker.  Evaluation of left knee demonstrates no bruising.  Mild edema around the knee.  Tenderness to palpation on the medial side of the knee.  Some pain with valgus stress in full extension.  Negative Lachman.  No cords are appreciated.  Some tenderness in the posterior aspect of the knee.  Toes are warm and well-perfused.   IMAGING: I personally reviewed images previously obtained in clinic  X-rays left knee were previously obtained.  No acute injuries.  Minimal degenerative changes.  Poor bone quality overall.   New Medications:  No orders of the defined types were placed in this encounter.     Tonita Frater, MD  08/17/2023 2:49 PM

## 2023-08-25 ENCOUNTER — Other Ambulatory Visit: Payer: Self-pay | Admitting: Physician Assistant

## 2023-08-25 NOTE — Telephone Encounter (Signed)
 Copied from CRM [id number]. Topic: Clinical - Medication Refill >> Aug 25, 2023  1:21 PM Adaline Holly wrote: Medication: montelukast  (SINGULAIR ) 10 MG tablet  This is the patient's preferred pharmacy:  [facility] [place]  Dorrene Gauc[place], [place] - [address] [place] [address] Phone: [phone] Fax: [phone]   Is this the correct pharmacy for this prescription? Yes   Has the prescription been filled recently? Yes  Is the patient out of the medication? Yes  Has the patient been seen for an appointment in the last year OR does the patient have an upcoming appointment? Yes  Can we respond through MyChart? No

## 2023-08-29 MED ORDER — MONTELUKAST SODIUM 10 MG PO TABS: 10.0000 mg | ORAL_TABLET | Freq: Every day | ORAL | 0 refills | Status: DC

## 2023-08-29 NOTE — Telephone Encounter (Signed)
 Requested Prescriptions  Pending Prescriptions Disp Refills   montelukast  (SINGULAIR ) 10 MG tablet 90 tablet 3     Pulmonology:  Leukotriene Inhibitors Passed - 08/29/2023 11:13 AM      Passed - Valid encounter within last 12 months    Recent Outpatient Visits           3 weeks ago Gastroesophageal reflux disease without esophagitis   [facility] [place], [place], PA-C   1 month ago Acute pain of left knee   [place] [facility] [place], Janna, PA-C   3 months ago Diabetes mellitus due to underlying condition with diabetic autonomic neuropathy, unspecified whether long term insulin  use [facility])   [place] [facility] [place], [place], PA-C   3 months ago Urinary urgency   [facility] [place], [place], PA-C   4 months ago Acute cough   [facility] Leavy Mole, PA-C

## 2023-09-05 ENCOUNTER — Other Ambulatory Visit: Payer: Self-pay | Admitting: Physician Assistant

## 2023-09-05 DIAGNOSIS — K219 Gastro-esophageal reflux disease without esophagitis: Secondary | ICD-10-CM

## 2023-09-05 DIAGNOSIS — N3281 Overactive bladder: Secondary | ICD-10-CM

## 2023-09-05 DIAGNOSIS — I5022 Chronic systolic (congestive) heart failure: Secondary | ICD-10-CM

## 2023-09-23 ENCOUNTER — Other Ambulatory Visit: Payer: Self-pay | Admitting: Physician Assistant

## 2023-09-23 DIAGNOSIS — S51012S Laceration without foreign body of left elbow, sequela: Secondary | ICD-10-CM

## 2023-09-23 NOTE — Telephone Encounter (Signed)
 Copied from CRM [id number]. Topic: Clinical - Medication Refill >> Sep 23, 2023 10:08 AM Myrick T wrote: Medication: alendronate  (FOSAMAX ) 70 MG tablet  Has the patient contacted their pharmacy? Yes (Agent: If no, request that the patient contact the pharmacy for the refill. If patient does not wish to contact the pharmacy document the reason why and proceed with request.) (Agent: If yes, when and what did the pharmacy advise?)  This is the patient's preferred pharmacy:  [facility] [place]  [place], [place] - [address] [place] [address] Phone: [phone] Fax: [phone]  Is this the correct pharmacy for this prescription? Yes  Has the prescription been filled recently? Yes  Is the patient out of the medication? No  Has the patient been seen for an appointment in the last year OR does the patient have an upcoming appointment? Yes  Can we respond through MyChart? Yes  Agent: Please be advised that Rx refills may take up to 3 business days. We ask that you follow-up with your pharmacy.

## 2023-09-26 MED ORDER — ALENDRONATE SODIUM 70 MG PO TABS: 70.0000 mg | ORAL_TABLET | ORAL | 0 refills | Status: DC

## 2023-09-26 NOTE — Telephone Encounter (Signed)
 Requested Prescriptions  Pending Prescriptions Disp Refills   alendronate  (FOSAMAX ) 70 MG tablet 12 tablet 0    Sig: Take 1 tablet (70 mg total) by mouth every 7 (seven) days. Take with a full glass of water on an empty stomach.     Endocrinology:  Bisphosphonates Failed - 09/26/2023 11:52 AM      Failed - Ca in normal range and within 360 days    Calcium   Date Value Ref Range Status  05/23/2023 8.6 (L) 8.7 - 10.3 mg/dL Final   Calcium , Total  Date Value Ref Range Status  01/01/2014 8.3 (L) 8.5 - 10.1 mg/dL Final         Failed - Vitamin D  in normal range and within 360 days    Vit D, 25-Hydroxy  Date Value Ref Range Status  09/23/2022 82.1 30.0 - 100.0 ng/mL Final    Comment:    Vitamin D  deficiency has been defined by the Institute of Medicine and an Endocrine Society practice guideline as a level of serum 25-OH vitamin D  less than 20 ng/mL (1,2). The Endocrine Society went on to further define vitamin D  insufficiency as a level between 21 and 29 ng/mL (2). 1. IOM (Institute of Medicine). 2010. Dietary reference    intakes for calcium  and D. [place]: The    [employer]. 2. Holick MF, Binkley [place], Bischoff-Ferrari HA, et al.    Evaluation, treatment, and prevention of vitamin D     deficiency: an Endocrine Society clinical practice    guideline. JCEM. 2011 Jul; 96(7):1911-30.          Failed - Cr in normal range and within 360 days    Creat  Date Value Ref Range Status  12/16/2016 1.08 (H) 0.60 - 0.93 mg/dL Final    Comment:    For patients >69 years of age, the reference limit for Creatinine is approximately 13% higher for people identified as African-American. .    Creatinine, Ser  Date Value Ref Range Status  05/23/2023 1.31 (H) 0.57 - 1.00 mg/dL Final         Failed - Mg Level in normal range and within 360 days    Magnesium   Date Value Ref Range Status  12/21/2020 1.8 1.7 - 2.4 mg/dL Final    Comment:    Performed at [facility], [address] [place], [place] [address]         Failed - Phosphate in normal range and within 360 days    No results found for: PHOS       Passed - eGFR is 30 or above and within 360 days    GFR, Est African American  Date Value Ref Range Status  12/16/2016 60 > OR = 60 mL/min/1.55m2 Final   GFR calc Af Amer  Date Value Ref Range Status  02/11/2020 70 >59 mL/min/1.73 Final    Comment:    **In accordance with recommendations from the NKF-ASN Task force,**   [facility] is in the process of updating its eGFR calculation to the   2021 CKD-EPI creatinine equation that estimates kidney function   without a race variable.    GFR, Est Non African American  Date Value Ref Range Status  12/16/2016 52 (L) > OR = 60 mL/min/1.83m2 Final   GFR, Estimated  Date Value Ref Range Status  12/22/2020 46 (L) >60 mL/min Final    Comment:    (NOTE) Calculated using the CKD-EPI Creatinine Equation (2021)    eGFR  Date  Value Ref Range Status  05/23/2023 42 (L) >59 mL/min/1.73 Final         Passed - Valid encounter within last 12 months    Recent Outpatient Visits           1 month ago Gastroesophageal reflux disease without esophagitis   [facility] [place], [place], PA-C   2 months ago Acute pain of left knee   [facility] [place], [place], PA-C   4 months ago Diabetes mellitus due to underlying condition with diabetic autonomic neuropathy, unspecified whether long term insulin  use [facility])   [facility] [place], [place], PA-C   4 months ago Urinary urgency   [facility] [place], [place], PA-C   5 months ago Acute cough   [facility] [place], [place], PA-C              Passed - Bone Mineral Density or Dexa Scan completed in the last 2 years

## 2023-09-27 DIAGNOSIS — Z961 Presence of intraocular lens: Secondary | ICD-10-CM | POA: Diagnosis not present

## 2023-09-27 DIAGNOSIS — E119 Type 2 diabetes mellitus without complications: Secondary | ICD-10-CM | POA: Diagnosis not present

## 2023-09-27 DIAGNOSIS — H353131 Nonexudative age-related macular degeneration, bilateral, early dry stage: Secondary | ICD-10-CM | POA: Diagnosis not present

## 2023-09-27 LAB — HM DIABETES EYE EXAM

## 2023-09-28 ENCOUNTER — Encounter: Payer: Self-pay | Admitting: Family Medicine

## 2023-10-24 ENCOUNTER — Other Ambulatory Visit: Payer: Self-pay | Admitting: Physician Assistant

## 2023-10-24 DIAGNOSIS — F419 Anxiety disorder, unspecified: Secondary | ICD-10-CM

## 2023-10-24 DIAGNOSIS — I5022 Chronic systolic (congestive) heart failure: Secondary | ICD-10-CM

## 2023-10-24 NOTE — Telephone Encounter (Unsigned)
 Copied from CRM [id number]. Topic: Clinical - Medication Refill >> Oct 24, 2023 10:40 AM [name] POUR wrote: Medication: sertraline  (ZOLOFT ) 25 MG tablet AND losartan  (COZAAR ) 25 MG tablet  Has the patient contacted their pharmacy? Yes (Agent: If no, request that the patient contact the pharmacy for the refill. If patient does not wish to contact the pharmacy document the reason why and proceed with request.) (Agent: If yes, when and what did the pharmacy advise?) Pharmacy needs order to refill  This is the patient's preferred pharmacy:  [facility] [place]  - [place], [place] - [address] [place] [address] Phone: [phone] Fax: [phone]   Is this the correct pharmacy for this prescription? Yes If no, delete pharmacy and type the correct one.   Has the prescription been filled recently? No  Is the patient out of the medication? No  Has the patient been seen for an appointment in the last year OR does the patient have an upcoming appointment? Yes  Can we respond through MyChart? No  Agent: Please be advised that Rx refills may take up to 3 business days. We ask that you follow-up with your pharmacy.

## 2023-10-26 MED ORDER — SERTRALINE HCL 25 MG PO TABS: 25.0000 mg | ORAL_TABLET | Freq: Every day | ORAL | 2 refills | Status: DC

## 2023-10-26 MED ORDER — LOSARTAN POTASSIUM 25 MG PO TABS: 25.0000 mg | ORAL_TABLET | Freq: Every day | ORAL | 2 refills | Status: DC

## 2023-10-26 NOTE — Telephone Encounter (Signed)
 Requested Prescriptions  Pending Prescriptions Disp Refills   losartan  (COZAAR ) 25 MG tablet 30 tablet 2    Sig: Take 1 tablet (25 mg total) by mouth daily.     Cardiovascular:  Angiotensin Receptor Blockers Failed - 10/26/2023 10:37 AM      Failed - Cr in normal range and within 180 days    Creat  Date Value Ref Range Status  12/16/2016 1.08 (H) 0.60 - 0.93 mg/dL Final    Comment:    For patients >77 years of age, the reference limit for Creatinine is approximately 13% higher for people identified as African-American. .    Creatinine, Ser  Date Value Ref Range Status  05/23/2023 1.31 (H) 0.57 - 1.00 mg/dL Final         Passed - K in normal range and within 180 days    Potassium  Date Value Ref Range Status  05/23/2023 4.8 3.5 - 5.2 mmol/L Final  01/01/2014 4.2 3.5 - 5.1 mmol/L Final         Passed - Patient is not pregnant      Passed - Last BP in normal range    BP Readings from Last 1 Encounters:  08/17/23 114/65         Passed - Valid encounter within last 6 months    Recent Outpatient Visits           2 months ago Gastroesophageal reflux disease without esophagitis   [place] [facility] [place], [place], PA-C   3 months ago Acute pain of left knee   [place] [facility] [place], [place], PA-C   5 months ago Diabetes mellitus due to underlying condition with diabetic autonomic neuropathy, unspecified whether long term insulin  use [facility])   [place] [facility] [place], [place], PA-C   5 months ago Urinary urgency   [place] [facility] [place], [place], PA-C   6 months ago Acute cough   [facility] [place], [place], PA-C               sertraline  (ZOLOFT ) 25 MG tablet 30 tablet 2    Sig: Take 1 tablet (25 mg total) by mouth daily.     Psychiatry:  Antidepressants - SSRI - sertraline  Passed - 10/26/2023 10:37 AM      Passed - AST in normal range and within 360 days     AST  Date Value Ref Range Status  05/23/2023 10 0 - 40 IU/L Final         Passed - ALT in normal range and within 360 days    ALT  Date Value Ref Range Status  05/23/2023 10 0 - 32 IU/L Final         Passed - Completed PHQ-2 or PHQ-9 in the last 360 days      Passed - Valid encounter within last 6 months    Recent Outpatient Visits           2 months ago Gastroesophageal reflux disease without esophagitis   [place] [facility] [place], [place], PA-C   3 months ago Acute pain of left knee   [place] [facility] [place], [place], PA-C   5 months ago Diabetes mellitus due to underlying condition with diabetic autonomic neuropathy, unspecified whether long term insulin  use [facility])   [place] [facility] [place], [place], PA-C   5 months ago Urinary urgency    [facility] [place], Janna, PA-C  6 months ago Acute cough   [facility] [place], PA-C

## 2023-11-02 NOTE — Progress Notes (Signed)
 Established patient visit  Patient: Andrea [name]   DOB: [birth year]   78 y.o. Female  MRN: [id number] Visit Date: 11/03/2023  Today's healthcare provider: Jolynn Spencer, PA-C   Chief Complaint  Patient presents with   Medical Management of Chronic Issues    Patient presents for follow up of chronic conditions.  Reports feeling well overall. No acute issues today.   Subjective     HPI     Medical Management of Chronic Issues    Additional comments: Patient presents for follow up of chronic conditions.  Reports feeling well overall. No acute issues today.      Last edited by Cherry Chiquita CHRISTELLA, CMA on 11/03/2023 11:03 AM.       Discussed the use of AI scribe software for clinical note transcription with the patient, who gave verbal consent to proceed.  History of Present Illness Andrea [name] is a 78 year old female with diabetes who presents for follow-up of knee pain and diabetes management. She is accompanied by her daughter.  She experiences knee pain and swelling, which initially improved without injections. Last month, she twisted her leg again, leading to recurrent pain in the same area. She has persistent issues with her left leg and walks with difficulty.  She manages diabetes with Farxiga , Tresiba , and Ozempic . Her blood sugar was 106 this morning and has been in the 80s recently. She attempts to maintain adequate hydration.  Her eye doctor mentioned possible slight macular degeneration last month, with no new issues reported. She is scheduled to return in six months.  She denies numbness, tingling, chest pain, shortness of breath, heart palpitations, urinary tract infection symptoms, vaginal discharge, or unusual urine odor.  She continues to smoke minimally. Her depression and anxiety are stable, though her daughter notes some sadness due to a recent move. 05/2023 reviewed labs Except elevated A1c of 7.1 Advise low-carb diet and daily exercise as  tolerated Continue care current treatment for diabetes   Except decreased kidney function Advised to drink 1.5 to 2 L every day, avoid NSAIDs, and have renal functions check avery 6-12 months to ensure stability.     Except elevated white blood cells which indicative of infection and recent UTIs and because of bacterial vaginosis       11/03/2023   11:02 AM 08/04/2023    1:34 PM 05/03/2023    9:59 AM  Depression screen PHQ 2/9  Decreased Interest 0 0 0  Down, Depressed, Hopeless 0 0 0  PHQ - 2 Score 0 0 0  Altered sleeping 0 0 0  Tired, decreased energy 0 0 0  Change in appetite 0 0 0  Feeling bad or failure about yourself  0 0 0  Trouble concentrating 0 0 0  Moving slowly or fidgety/restless 0 0 0  Suicidal thoughts 0 0 0  PHQ-9 Score 0 0 0  Difficult doing work/chores Not difficult at all Not difficult at all Not difficult at all      11/03/2023   11:02 AM 05/03/2023   10:00 AM 06/23/2021    2:52 PM  GAD 7 : Generalized Anxiety Score  Nervous, Anxious, on Edge 0 0 3  Control/stop worrying 0 0 1  Worry too much - different things 0 0 2  Trouble relaxing 0 0 0  Restless 0 0 0  Easily annoyed or irritable 0 0 1  Afraid - awful might happen 0 0 1  Total GAD 7 Score 0 0 8  Anxiety Difficulty  Not difficult at all Not difficult at all Not difficult at all    Medications: Outpatient Medications Prior to Visit  Medication Sig   albuterol  (VENTOLIN  HFA) 108 (90 Base) MCG/ACT inhaler Inhale 2 puffs into the lungs every 4 (four) hours as needed for wheezing or shortness of breath.   Alcohol  Swabs (ALCOHOL  PADS) 70 % PADS 1 each by Does not apply route in the morning and at bedtime.   alendronate  (FOSAMAX ) 70 MG tablet Take 1 tablet (70 mg total) by mouth every 7 (seven) days. Take with a full glass of water on an empty stomach.   atorvastatin  (LIPITOR ) 20 MG tablet Take 1 tablet (20 mg total) by mouth daily.   Calcium  Carbonate-Vitamin D  600-400 MG-UNIT tablet Take 1 tablet by  mouth daily.   Cholecalciferol  25 MCG (1000 UT) capsule Take 1,000 Units by mouth daily.   clindamycin  (CLEOCIN ) 2 % vaginal cream Place 1 Applicatorful vaginally at bedtime.   Cyanocobalamin  (B-12) 1000 MCG CAPS Take 1,000 mcg by mouth daily.   dapagliflozin  propanediol (FARXIGA ) 10 MG TABS tablet Take 10 mg by mouth daily. Receives through [place] patient assistance program   furosemide  (LASIX ) 20 MG tablet TAKE 1 TABLET (20 MG TOTAL) BY MOUTH DAILY.   insulin  degludec (TRESIBA  FLEXTOUCH) 100 UNIT/ML FlexTouch Pen Inject 28 Units into the skin daily. Patient receives via [employer] Patient Assistance through December 2024   Insulin  Pen Needle (TRUEPLUS PEN NEEDLES) 31G X 6 MM MISC Use with lantus  two times daily.   ketoconazole  (NIZORAL ) 2 % cream Apply 1 Application topically daily.   losartan  (COZAAR ) 25 MG tablet Take 1 tablet (25 mg total) by mouth daily.   metoprolol  succinate (TOPROL -XL) 50 MG 24 hr tablet Take 1 tablet (50 mg total) by mouth daily.   montelukast  (SINGULAIR ) 10 MG tablet Take 1 tablet (10 mg total) by mouth at bedtime.   Multiple Vitamins-Minerals (PRESERVISION AREDS 2 PO) Take 1 tablet by mouth in the morning and at bedtime.   Omega-3 1000 MG CAPS Take 1,000 mg by mouth daily.   omeprazole  (PRILOSEC) 40 MG capsule TAKE 1 CAPSULE BY MOUTH DAILY   OneTouch Delica Lancets 33G MISC To check blood sugar three times daily  DX: E11.42   ONETOUCH VERIO test strip Use to check blood sugar three times daily. DX E11.42   oxybutynin  (DITROPAN -XL) 10 MG 24 hr tablet TAKE 1 TABLET (10 MG TOTAL) BY MOUTH AT BEDTIME.   Semaglutide ,0.25 or 0.5MG /DOS, (OZEMPIC , 0.25 OR 0.5 MG/DOSE,) 2 MG/3ML SOPN Inject 0.5 mg into the skin once a week. Patient receives via [employer] Patient Assistance through December 2024   sertraline  (ZOLOFT ) 25 MG tablet Take 1 tablet (25 mg total) by mouth daily.   No facility-administered medications prior to visit.    Review of Systems  All other systems  reviewed and are negative.  All negative Except see HPI       Objective    BP 138/61 (BP Location: Right Arm, Patient Position: Sitting, Cuff Size: Normal)   Pulse 74   Ht 4' 7 (1.397 m)   Wt 128 lb 4.8 oz (58.2 kg)   SpO2 100%   BMI 29.82 kg/m     Physical Exam Vitals reviewed.  Constitutional:      General: She is not in acute distress.    Appearance: Normal appearance. She is well-developed. She is not diaphoretic.  HENT:     Head: Normocephalic and atraumatic.  Eyes:     General: No  scleral icterus.    Conjunctiva/sclera: Conjunctivae normal.  Neck:     Thyroid : No thyromegaly.  Cardiovascular:     Rate and Rhythm: Normal rate and regular rhythm.     Pulses: Normal pulses.     Heart sounds: Normal heart sounds. No murmur heard. Pulmonary:     Effort: Pulmonary effort is normal. No respiratory distress.     Breath sounds: Normal breath sounds. No wheezing, rhonchi or rales.  Musculoskeletal:     Cervical back: Neck supple.     Right lower leg: No edema.     Left lower leg: No edema.  Lymphadenopathy:     Cervical: No cervical adenopathy.  Skin:    General: Skin is warm and dry.     Findings: No rash.  Neurological:     Mental Status: She is alert and oriented to person, place, and time. Mental status is at baseline.  Psychiatric:        Mood and Affect: Mood normal.        Behavior: Behavior normal.      No results found for any visits on 11/03/23.      Assessment & Plan Left knee pain and swelling Chronic pain and swelling worsened by recent injury. Hesitant about injection therapy due to past experience. - Encourage daily walking. - Consider physical therapy if balance issues persist. 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 Blood sugar stable. Kidney function requires monitoring. - Check A1c levels. - Monitor kidney function. - Encourage hydration  with 1.5 to 2 liters of water daily. - Continue Farxiga , Tresiba , and Ozempic . Continue lifestyle modifications Will follow-up  Peripheral artery disease Chronic Requires regular walking to improve circulation. - Encourage daily walking. - Monitor for new symptoms of numbness or tingling. Will follow-up  Nicotine dependence Continued smoking. Cessation important for health improvement, especially with existing conditions. Will follow-up  Osteoporosis Chronic Increased fracture risk. Emphasis on mobility and fall prevention. - Encourage use of a walker. - Promote regular exercise. Continue alendronate  20 Will follow-up  Anxiety and depression Chronic Potentially exacerbated by family dynamics and living changes. Emotional support and monitoring needed. Will follow-up  Essential hypertension Chronic and unstable Continue metoprolol  50, losartan  25mg , furosemide  20 Continue low salt diet and daily exercise Will follow-up  Diabetes mellitus due to underlying condition with diabetic autonomic neuropathy, unspecified whether long term insulin  use (HCC)  - Hemoglobin A1c - Comprehensive metabolic panel with GFR - CBC with Differential/Platelet  Hyperlipidemia LDL goal <70 Chronic Continue atorvastatin  20, lifesstyle modifications Will follow-up  Gastroesophageal reflux disease without esophagitis Chronic And stable Continue PPI/prilosec 40 Elevate the head of the bed 6-8 inches, avoid recumbency for 3 hours after eating, avoid food as a delayed gastric emptying, weight loss   Will follow-up  Anxiety    11/03/2023   11:02 AM 05/03/2023   10:00 AM 06/23/2021    2:52 PM  GAD 7 : Generalized Anxiety Score  Nervous, Anxious, on Edge 0 0 3  Control/stop worrying 0 0 1  Worry too much - different things 0 0 2  Trouble relaxing 0 0 0  Restless 0 0 0  Easily annoyed or irritable 0 0 1  Afraid - awful might happen 0 0 1  Total GAD 7 Score 0 0 8  Anxiety Difficulty Not  difficult at all Not difficult at all Not difficult at all    Chronic and stable Continue sertraline  25mg  And relaxation  techniques Will follow-up  Orders Placed This Encounter  Procedures   Hemoglobin A1c   Comprehensive metabolic panel with GFR   CBC with Differential/Platelet    Return in about 3 months (around 02/03/2024) for chronic disease f/u.   The patient was advised to call back or seek an in-person evaluation if the symptoms worsen or if the condition fails to improve as anticipated.  I discussed the assessment and treatment plan with the patient. The patient was provided an opportunity to ask questions and all were answered. The patient agreed with the plan and demonstrated an understanding of the instructions.  I, Kanan Sobek, PA-C have reviewed all documentation for this visit. The documentation on 11/03/2023  for the exam, diagnosis, procedures, and orders are all accurate and complete.  Jolynn Spencer, [facility], MMS [facility] [phone] (phone) [phone] (fax)  [facility]

## 2023-11-03 ENCOUNTER — Ambulatory Visit (INDEPENDENT_AMBULATORY_CARE_PROVIDER_SITE_OTHER): Admitting: Physician Assistant

## 2023-11-03 ENCOUNTER — Other Ambulatory Visit: Payer: Self-pay | Admitting: Physician Assistant

## 2023-11-03 ENCOUNTER — Encounter: Payer: Self-pay | Admitting: Physician Assistant

## 2023-11-03 VITALS — BP 138/61 | HR 74 | Ht <= 58 in | Wt 128.3 lb

## 2023-11-03 DIAGNOSIS — K219 Gastro-esophageal reflux disease without esophagitis: Secondary | ICD-10-CM | POA: Diagnosis not present

## 2023-11-03 DIAGNOSIS — I10 Essential (primary) hypertension: Secondary | ICD-10-CM | POA: Diagnosis not present

## 2023-11-03 DIAGNOSIS — I739 Peripheral vascular disease, unspecified: Secondary | ICD-10-CM | POA: Diagnosis not present

## 2023-11-03 DIAGNOSIS — F419 Anxiety disorder, unspecified: Secondary | ICD-10-CM

## 2023-11-03 DIAGNOSIS — E0843 Diabetes mellitus due to underlying condition with diabetic autonomic (poly)neuropathy: Secondary | ICD-10-CM | POA: Diagnosis not present

## 2023-11-03 DIAGNOSIS — M810 Age-related osteoporosis without current pathological fracture: Secondary | ICD-10-CM | POA: Diagnosis not present

## 2023-11-03 DIAGNOSIS — F17200 Nicotine dependence, unspecified, uncomplicated: Secondary | ICD-10-CM

## 2023-11-03 DIAGNOSIS — E559 Vitamin D deficiency, unspecified: Secondary | ICD-10-CM

## 2023-11-03 DIAGNOSIS — I5022 Chronic systolic (congestive) heart failure: Secondary | ICD-10-CM

## 2023-11-03 DIAGNOSIS — E785 Hyperlipidemia, unspecified: Secondary | ICD-10-CM | POA: Diagnosis not present

## 2023-11-03 DIAGNOSIS — M25462 Effusion, left knee: Secondary | ICD-10-CM | POA: Diagnosis not present

## 2023-11-04 ENCOUNTER — Ambulatory Visit: Admitting: Physician Assistant

## 2023-11-07 ENCOUNTER — Other Ambulatory Visit: Payer: Self-pay | Admitting: Physician Assistant

## 2023-11-07 DIAGNOSIS — S51012S Laceration without foreign body of left elbow, sequela: Secondary | ICD-10-CM

## 2023-11-18 LAB — COMPREHENSIVE METABOLIC PANEL WITH GFR
ALT: 11 IU/L (ref 0–32)
AST: 14 IU/L (ref 0–40)
Albumin: 4.3 g/dL (ref 3.8–4.8)
Alkaline Phosphatase: 120 IU/L (ref 44–121)
BUN/Creatinine Ratio: 26 (ref 12–28)
BUN: 37 mg/dL — ABNORMAL HIGH (ref 8–27)
Bilirubin Total: 0.5 mg/dL (ref 0.0–1.2)
CO2: 22 mmol/L (ref 20–29)
Calcium: 9.7 mg/dL (ref 8.7–10.3)
Chloride: 103 mmol/L (ref 96–106)
Creatinine, Ser: 1.41 mg/dL — ABNORMAL HIGH (ref 0.57–1.00)
Globulin, Total: 2.4 g/dL (ref 1.5–4.5)
Glucose: 96 mg/dL (ref 70–99)
Potassium: 4.8 mmol/L (ref 3.5–5.2)
Sodium: 142 mmol/L (ref 134–144)
Total Protein: 6.7 g/dL (ref 6.0–8.5)
eGFR: 38 mL/min/1.73 — ABNORMAL LOW (ref >59)

## 2023-11-18 LAB — CBC WITH DIFFERENTIAL/PLATELET
Basophils Absolute: 0 x10E3/uL (ref 0.0–0.2)
Basos: 1 %
EOS (ABSOLUTE): 0.2 x10E3/uL (ref 0.0–0.4)
Eos: 2 %
Hematocrit: 40.9 % (ref 34.0–46.6)
Hemoglobin: 12.5 g/dL (ref 11.1–15.9)
Immature Grans (Abs): 0 x10E3/uL (ref 0.0–0.1)
Immature Granulocytes: 0 %
Lymphocytes Absolute: 2.4 x10E3/uL (ref 0.7–3.1)
Lymphs: 34 %
MCH: 29.5 pg (ref 26.6–33.0)
MCHC: 30.6 g/dL — ABNORMAL LOW (ref 31.5–35.7)
MCV: 97 fL (ref 79–97)
Monocytes Absolute: 0.5 x10E3/uL (ref 0.1–0.9)
Monocytes: 7 %
Neutrophils Absolute: 4 x10E3/uL (ref 1.4–7.0)
Neutrophils: 56 %
Platelets: 201 x10E3/uL (ref 150–450)
RBC: 4.24 x10E6/uL (ref 3.77–5.28)
RDW: 14.4 % (ref 11.7–15.4)
WBC: 7.1 x10E3/uL (ref 3.4–10.8)

## 2023-11-18 LAB — HEMOGLOBIN A1C
Est. average glucose Bld gHb Est-mCnc: 143 mg/dL
Hgb A1c MFr Bld: 6.6 % — ABNORMAL HIGH (ref 4.8–5.6)

## 2023-11-21 ENCOUNTER — Telehealth: Payer: Self-pay

## 2023-11-21 ENCOUNTER — Ambulatory Visit: Payer: Self-pay | Admitting: Physician Assistant

## 2023-11-21 NOTE — Telephone Encounter (Signed)
 Received a refill reorder request from [employer] on Tresiba  ,faxed it to provider office to sign and date ,can be fax to [employer] or fax back to [phone].

## 2023-11-23 ENCOUNTER — Other Ambulatory Visit: Payer: Self-pay | Admitting: Physician Assistant

## 2023-11-23 DIAGNOSIS — I5022 Chronic systolic (congestive) heart failure: Secondary | ICD-10-CM

## 2023-11-23 NOTE — Telephone Encounter (Unsigned)
 Copied from CRM [id number]. Topic: Clinical - Medication Refill >> Nov 23, 2023  3:23 PM Emylou G wrote: Medication: metoprolol  succinate (TOPROL -XL) 50 MG 24 hr tablet  Has the patient contacted their pharmacy? Yes (Agent: If no, request that the patient contact the pharmacy for the refill. If patient does not wish to contact the pharmacy document the reason why and proceed with request.) (Agent: If yes, when and what did the pharmacy advise?) they called  This is the patient's preferred pharmacy:  [facility] [place]  - [place], [place] - [address] [place] [address] Phone: [phone] Fax: [phone]    Is this the correct pharmacy for this prescription? Yes If no, delete pharmacy and type the correct one.   Has the prescription been filled recently? Yes  Is the patient out of the medication? Yes  Has the patient been seen for an appointment in the last year OR does the patient have an upcoming appointment? Yes  Can we respond through MyChart? No  Agent: Please be advised that Rx refills may take up to 3 business days. We ask that you follow-up with your pharmacy.

## 2023-11-23 NOTE — Telephone Encounter (Signed)
 Received provider portion [employer] (Ozempic ) faxed to [employer] today.

## 2023-11-24 MED ORDER — METOPROLOL SUCCINATE ER 50 MG PO TB24: 50.0000 mg | ORAL_TABLET | Freq: Every day | ORAL | 1 refills | Status: AC

## 2023-11-24 NOTE — Telephone Encounter (Signed)
 Requested Prescriptions  Pending Prescriptions Disp Refills   metoprolol  succinate (TOPROL -XL) 50 MG 24 hr tablet 90 tablet 1    Sig: Take 1 tablet (50 mg total) by mouth daily.     Cardiovascular:  Beta Blockers Passed - 11/24/2023  2:43 PM      Passed - Last BP in normal range    BP Readings from Last 1 Encounters:  11/03/23 138/61         Passed - Last Heart Rate in normal range    Pulse Readings from Last 1 Encounters:  11/03/23 74         Passed - Valid encounter within last 6 months    Recent Outpatient Visits           3 weeks ago PAD (peripheral artery disease) (HCC)   [place] [facility] [place], [place], PA-C   3 months ago Gastroesophageal reflux disease without esophagitis   [place] [facility] [place], [place], PA-C   4 months ago Acute pain of left knee   [place] [facility] [place], [place], PA-C   6 months ago Diabetes mellitus due to underlying condition with diabetic autonomic neuropathy, unspecified whether long term insulin  use [facility])   [place] [facility] [place], [place], PA-C   6 months ago Urinary urgency   [facility] [place], Janna, PA-C

## 2023-12-07 ENCOUNTER — Telehealth: Payer: Self-pay

## 2023-12-07 NOTE — Telephone Encounter (Signed)
 Gave [employer] a call, received a refill request on pen needles for the pt ,but we had submitted a refill order on 11/22/23 that included the needles, spoke with representative said they could not read date on provider portion that we needed to clarify date and resubmit.fixed date on refill order form and faxed it to [employer] today.

## 2023-12-09 ENCOUNTER — Telehealth: Payer: Self-pay

## 2023-12-09 NOTE — Telephone Encounter (Signed)
 Spoke with Zada pt daughter advised if she could bring form/letter that she received to our office so we can have a look at what the letter is needing to make sure pt receives what she is needing.

## 2023-12-09 NOTE — Telephone Encounter (Signed)
 Copied from CRM [id number]. Topic: General - Other >> Dec 09, 2023  9:56 AM Treva T wrote: Reason for CRM: Received call from patient, states she attempting to get patient medication assistance, and has received a letter from [employer], stating that application for patient assistance program application is unable to be processed due to missing/unclear medical information from provider.   Requesting provider to send any/all medical information regarding medications for assistance.   Medications:  insulin  degludec (TRESIBA  FLEXTOUCH) 100 UNIT/ML FlexTouch Pen  Semaglutide ,0.25 or 0.5MG /DOS, (OZEMPIC , 0.25 OR 0.5 MG/DOSE,) 2 MG/3ML SOPN  Patient can be reached for follow up at [phone] (Joy - Daughter), aware of same day call back.  [employer] Patient Assistance program [address]  [place], [place] [address]  Fax#: [phone] Ph#:  [phone]

## 2023-12-11 ENCOUNTER — Telehealth: Payer: Self-pay

## 2023-12-11 NOTE — Telephone Encounter (Unsigned)
 Copied from CRM [id number]. Topic: General - Other >> Dec 09, 2023  4:51 PM Delon T wrote: Reason for CRM: returning call from office, there is no mention on what information is missing so she does not feel she needs to bring the letter- daughter Zada  [phone]

## 2023-12-12 ENCOUNTER — Telehealth: Payer: Self-pay

## 2023-12-12 NOTE — Telephone Encounter (Signed)
 Spoke with Nat Rigg Pharmacy Technician and advised the order request was sent by Ana Ollison. Contact her to see what prescriber information was sent as neither Kelly Cedar or Janna are the ordering prescriber that [employer] received.

## 2023-12-12 NOTE — Telephone Encounter (Signed)
 Spoke with joy and she states she would not be aware of another prescriber on file that prescribed this medication.  I called [facility] again and spoke with Kayla and until I verify the prescriber information they can not give me information on pt. They stated of receiving an order form in February regarding medication refill.

## 2023-12-12 NOTE — Telephone Encounter (Signed)
 Joy patient's daughter  is calling to report that the patient received a letter from [facility] stating that that they are missing some information in order to process insulin  degludec (TRESIBA  FLEXTOUCH) 100 UNIT/ML FlexTouch Pen [[id number]] , Semaglutide ,0.25 or 0.5MG /DOS, (OZEMPIC , 0.25 OR 0.5 MG/DOSE,) 2 MG/3ML SOPN [[id number]] , novofine needle, Please advise  [facility] Phone- [phone] [facility] Fax- [phone]  [facility] Phone- [phone]

## 2023-12-12 NOTE — Telephone Encounter (Signed)
 Noted

## 2023-12-12 NOTE — Telephone Encounter (Signed)
 Error

## 2023-12-12 NOTE — Telephone Encounter (Signed)
 Spoke with Vaun from [facility] [phone]. Per Vaun they are unable to give me pt information as they need the prescriber information NPI, address and number in order to proceed. I gave Janna name and previous PCP Emilio and per Vaun they do not have them in their system. Will have to call Joy in regards to this as we are not sure what information is missing.

## 2023-12-13 NOTE — Telephone Encounter (Signed)
 Gave [facility] a call to follow up on pt refill reorder spoke with a representative explain they received the provider portion with provider signature and have mail out to provider office and will arrive with in 10-14 on Tresiba ,pen needles and Ozempic .

## 2023-12-19 NOTE — Progress Notes (Signed)
 Andrea [name]                                          MRN: [id number]   12/19/2023   The [facility] Quality Team Specialist reviewed this patient medical record for the purposes of chart review for care gap closure. The following were reviewed: chart review for care gap closure-kidney health evaluation for diabetes:eGFR  and uACR.    [facility] Quality Team

## 2023-12-20 ENCOUNTER — Telehealth: Payer: Self-pay

## 2023-12-20 ENCOUNTER — Other Ambulatory Visit: Payer: Self-pay

## 2023-12-20 ENCOUNTER — Other Ambulatory Visit (HOSPITAL_COMMUNITY): Payer: Self-pay

## 2023-12-20 NOTE — Progress Notes (Signed)
   12/20/2023 Name: Andrea [name] MRN: [id number] DOB: 02-13-1946  Patient enrolled in Ozempic  patient assistance program from [facility] through 03/07/2024   [facility] has announced that they will no longer offer Ozempic  to Medicare beneficiaries through their Patient Assistance Program in 2026.    Outreach to patient today and provide this update.    Patient reports she has already been enrolled in Medicare Extra Help/LIS. All prescription copays appear to be $0 on insurance. Likely will not need PAP for the new year.     Raja Liska E. Marsh, PharmD Clinical Pharmacist [facility] [phone]

## 2023-12-20 NOTE — Telephone Encounter (Signed)
 Pharmacy Patient Advocate Encounter  Insurance verification completed.   The patient is insured through [facility]   Ran test claim for Ozempic . Currently a quantity of 4 is a 30 day supply and the co-pay is 0 . Patient may be eligible for a Medicare prescription Payment plan. The patient will need to reach out to their insurance company to enrol in the payment plan to spread out their payments throughout the year, If available.  This test claim was processed through [facility]- copay amounts may vary at other pharmacies due to pharmacy/plan contracts, or as the patient moves through the different stages of their insurance plan.

## 2023-12-26 ENCOUNTER — Telehealth: Payer: Self-pay

## 2023-12-26 NOTE — Telephone Encounter (Unsigned)
 Copied from CRM #[id number]. Topic: Clinical - Prescription Issue >> Dec 26, 2023 11:44 AM Winona R wrote: Calling to find out if her Semaglutide ,0.25 or 0.5MG /DOS, (OZEMPIC , 0.25 OR 0.5 MG/DOSE,) 2 MG/3ML SOPNhas been delivered to the office yet.

## 2023-12-27 NOTE — Telephone Encounter (Signed)
 Advised

## 2024-01-03 ENCOUNTER — Telehealth: Payer: Self-pay

## 2024-01-03 ENCOUNTER — Other Ambulatory Visit (HOSPITAL_COMMUNITY): Payer: Self-pay

## 2024-01-03 ENCOUNTER — Other Ambulatory Visit: Payer: Self-pay | Admitting: Physician Assistant

## 2024-01-03 DIAGNOSIS — F419 Anxiety disorder, unspecified: Secondary | ICD-10-CM

## 2024-01-03 DIAGNOSIS — I5022 Chronic systolic (congestive) heart failure: Secondary | ICD-10-CM

## 2024-01-03 NOTE — Telephone Encounter (Signed)
 Pharmacy Patient Advocate Encounter   Received notification from PAP spreadsheet that prior authorization for Ozempic  0.25 or 0.5mg /dose Pen is required/requested.   Insurance verification completed.   The patient is insured through [employer].   Per test claim: PA required; PA submitted to above mentioned insurance via Latent Key/confirmation #/EOC [id number] Status is pending

## 2024-01-04 ENCOUNTER — Other Ambulatory Visit (HOSPITAL_COMMUNITY): Payer: Self-pay

## 2024-01-04 NOTE — Telephone Encounter (Signed)
 Pharmacy Patient Advocate Encounter  Received notification from [employer] that Prior Authorization for Ozempic  has been APPROVED from 01/03/2024 to 03/07/2024. Ran test claim, Copay is $0.00. This test claim was processed through [facility]- copay amounts may vary at other pharmacies due to pharmacy/plan contracts, or as the patient moves through the different stages of their insurance plan.   PA #/Case ID/Reference #: [id number]

## 2024-01-09 ENCOUNTER — Encounter: Payer: Self-pay | Admitting: Radiology

## 2024-01-11 ENCOUNTER — Other Ambulatory Visit: Admitting: Pharmacist

## 2024-01-12 ENCOUNTER — Telehealth: Payer: Self-pay

## 2024-01-12 ENCOUNTER — Encounter: Payer: Self-pay | Admitting: Emergency Medicine

## 2024-01-12 NOTE — Telephone Encounter (Signed)
 Gave pt a call pt is coming up due for re-enrollment on Tresiba ,left a HIPAA VM

## 2024-01-18 ENCOUNTER — Ambulatory Visit (INDEPENDENT_AMBULATORY_CARE_PROVIDER_SITE_OTHER): Payer: Self-pay | Admitting: Emergency Medicine

## 2024-01-18 VITALS — Ht 59.0 in | Wt 128.0 lb

## 2024-01-18 DIAGNOSIS — Z0000 Encounter for general adult medical examination without abnormal findings: Secondary | ICD-10-CM | POA: Diagnosis not present

## 2024-01-18 NOTE — Patient Instructions (Signed)
 Ms. Schlesinger,  Thank you for taking the time for your Medicare Wellness Visit. I appreciate your continued commitment to your health goals. Please review the care plan we discussed, and feel free to reach out if I can assist you further.  Please note that Annual Wellness Visits do not include a physical exam. Some assessments may be limited, especially if the visit was conducted virtually. If needed, we may recommend an in-person follow-up with your provider.  Ongoing Care Seeing your primary care provider every 3 to 6 months helps us  monitor your health and provide consistent, personalized care.   Referrals If a referral was made during today's visit and you haven't received any updates within two weeks, please contact the referred provider directly to check on the status.  Recommended Screenings:  Get the flu vaccine at your next OV on 02/08/24. You may get the Covid vaccine at your local pharmacy at your convenience.   Health Maintenance  Topic Date Due   Colon Cancer Screening  02/07/2022   Medicare Annual Wellness Visit  07/05/2023   Complete foot exam   09/07/2023   Flu Shot  10/07/2023   COVID-19 Vaccine (4 - 2025-26 season) 11/07/2023   Yearly kidney health urinalysis for diabetes  12/30/2023   DTaP/Tdap/Td vaccine (1 - Tdap) 04/20/2024*   Hemoglobin A1C  02/16/2024   Eye exam for diabetics  09/26/2024   DEXA scan (bone density measurement)  09/29/2024   Yearly kidney function blood test for diabetes  11/16/2024   Pneumococcal Vaccine for age over 66  Completed   Hepatitis C Screening  Completed   Zoster (Shingles) Vaccine  Completed   Meningitis B Vaccine  Aged Out   Breast Cancer Screening  Discontinued  *Topic was postponed. The date shown is not the original due date.       01/18/2024   10:22 AM  Advanced Directives  Does Patient Have a Medical Advance Directive? Yes  Type of Advance Directive Healthcare Power of Attorney  Does patient want to make changes to  medical advance directive? No - Patient declined  Copy of Healthcare Power of Attorney in Chart? Yes - validated most recent copy scanned in chart (See row information)    Vision: Annual vision screenings are recommended for early detection of glaucoma, cataracts, and diabetic retinopathy. These exams can also reveal signs of chronic conditions such as diabetes and high blood pressure.  Dental: Annual dental screenings help detect early signs of oral cancer, gum disease, and other conditions linked to overall health, including heart disease and diabetes.  Please see the attached documents for additional preventive care recommendations.   Fall Prevention in the Home, Adult Falls can cause injuries and affect people of all ages. There are many simple things that you can do to make your home safe and to help prevent falls. If you need it, ask for help making these changes. What actions can I take to prevent falls? General information Use good lighting in all rooms. Make sure to: Replace any light bulbs that burn out. Turn on lights if it is dark and use night-lights. Keep items that you use often in easy-to-reach places. Lower the shelves around your home if needed. Move furniture so that there are clear paths around it. Do not keep throw rugs or other things on the floor that can make you trip. If any of your floors are uneven, fix them. Add color or contrast paint or tape to clearly mark and help you see: Grab bars  or handrails. First and last steps of staircases. Where the edge of each step is. If you use a ladder or stepladder: Make sure that it is fully opened. Do not climb a closed ladder. Make sure the sides of the ladder are locked in place. Have someone hold the ladder while you use it. Know where your pets are as you move through your home. What can I do in the bathroom?     Keep the floor dry. Clean up any water that is on the floor right away. Remove soap buildup in the  bathtub or shower. Buildup makes bathtubs and showers slippery. Use non-skid mats or decals on the floor of the bathtub or shower. Attach bath mats securely with double-sided, non-slip rug tape. If you need to sit down while you are in the shower, use a non-slip stool. Install grab bars by the toilet and in the bathtub and shower. Do not use towel bars as grab bars. What can I do in the bedroom? Make sure that you have a light by your bed that is easy to reach. Do not use any sheets or blankets on your bed that hang to the floor. Have a firm bench or chair with side arms that you can use for support when you get dressed. What can I do in the kitchen? Clean up any spills right away. If you need to reach something above you, use a sturdy step stool that has a grab bar. Keep electrical cables out of the way. Do not use floor polish or wax that makes floors slippery. What can I do with my stairs? Do not leave anything on the stairs. Make sure that you have a light switch at the top and the bottom of the stairs. Have them installed if you do not have them. Make sure that there are handrails on both sides of the stairs. Fix handrails that are broken or loose. Make sure that handrails are as long as the staircases. Install non-slip stair treads on all stairs in your home if they do not have carpet. Avoid having throw rugs at the top or bottom of stairs, or secure the rugs with carpet tape to prevent them from moving. Choose a carpet design that does not hide the edge of steps on the stairs. Make sure that carpet is firmly attached to the stairs. Fix any carpet that is loose or worn. What can I do on the outside of my home? Use bright outdoor lighting. Repair the edges of walkways and driveways and fix any cracks. Clear paths of anything that can make you trip, such as tools or rocks. Add color or contrast paint or tape to clearly mark and help you see high doorway thresholds. Trim any bushes or  trees on the main path into your home. Check that handrails are securely fastened and in good repair. Both sides of all steps should have handrails. Install guardrails along the edges of any raised decks or porches. Have leaves, snow, and ice cleared regularly. Use sand, salt, or ice melt on walkways during winter months if you live where there is ice and snow. In the garage, clean up any spills right away, including grease or oil spills. What other actions can I take? Review your medicines with your health care provider. Some medicines can make you confused or feel dizzy. This can increase your chance of falling. Wear closed-toe shoes that fit well and support your feet. Wear shoes that have rubber soles and low heels. Use  a cane, walker, scooter, or crutches that help you move around if needed. Talk with your provider about other ways that you can decrease your risk of falls. This may include seeing a physical therapist to learn to do exercises to improve movement and strength. Where to find more information Centers for Disease Control and Prevention, [employer]: tonerpromos.no [employer] on Aging: baseringtones.pl National Institute on Aging: baseringtones.pl Contact a health care provider if: You are afraid of falling at home. You feel weak, drowsy, or dizzy at home. You fall at home. Get help right away if you: Lose consciousness or have trouble moving after a fall. Have a fall that causes a head injury. These symptoms may be an emergency. Get help right away. Call 911. Do not wait to see if the symptoms will go away. Do not drive yourself to the hospital. This information is not intended to replace advice given to you by your health care provider. Make sure you discuss any questions you have with your health care provider. Document Revised: 10/26/2021 Document Reviewed: 10/26/2021 Elsevier Patient Education  2024 [employer].

## 2024-01-18 NOTE — Progress Notes (Signed)
 Chief Complaint  Patient presents with   Medicare Wellness     Subjective:   Andrea [name] is a 78 y.o. female who presents for a Medicare Annual Wellness Visit.  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   Depression    Diabetes mellitus type 2, uncomplicated (HCC)    Diabetic retinopathy (HCC)    Dupuytren contracture 2011   s/p surgery to LEFT hand   Frequent urinary tract infections    GERD (gastroesophageal reflux disease)    Grade I diastolic dysfunction    HTN (hypertension)    Hyperlipidemia, unspecified    Irritable bowel syndrome    Meningitis 11/2019   MI (myocardial infarction) (HCC) 1994   no stent   MRSA (methicillin resistant staph aureus) culture positive    Neuromuscular disorder (HCC)    nerve pain in back and legs   Osteoporosis    Pneumonia 2021   RBBB    Sepsis (HCC) 11/2019   Skin cancer    Skin tear of left elbow without complication 10/20/2022   TIA (transient ischemic attack) 2014   Vitamin D  deficiency, unspecified    Wheezing    Past Surgical History:  Procedure Laterality Date   BACK SURGERY     lower due to spinal stenosis   BONE BIOPSY Left 11/20/2020   Procedure: BONE BIOPSY;  Surgeon: Janit Thresa CHRISTELLA, DPM;  Location: [facility];  Service: Podiatry;  Laterality: Left;   CARDIAC CATHETERIZATION  1994   CATARACT EXTRACTION, BILATERAL Bilateral    COLONOSCOPY     x3   COLONOSCOPY WITH PROPOFOL  N/A 02/08/2019   Procedure: COLONOSCOPY WITH PROPOFOL ;  Surgeon: Therisa Bi, MD;  Location: [facility];  Service: Gastroenterology;  Laterality: N/A;    CORONARY ANGIOPLASTY  1994   DUPUYTREN CONTRACTURE RELEASE Left 2011   HARDWARE REMOVAL Left 08/19/2020   Procedure: HARDWARE REMOVAL;  Surgeon: Kathlynn Sharper, MD;  Location: [facility];  Service: Orthopedics;  Laterality: Left;   HIP FRACTURE SURGERY  11/2013   IRRIGATION AND DEBRIDEMENT FOOT Left 11/20/2020   Procedure: IRRIGATION AND DEBRIDEMENT FOOT-Heel Ulcer;  Surgeon: Janit Thresa CHRISTELLA, DPM;  Location: [facility];  Service: Podiatry;  Laterality: Left;   PR COLSC FLX W/REMOVAL LESION BY HOT BX FORCEPS   08/21/2015   Procedure: COLONOSCOPY, FLEXIBLE, PROXIMAL TO SPLENIC FLEXURE; W/REMOVAL TUMOR/POLYP/OTHER LESION, HOT BX FORCEP/CAUTE; Surgeon: Del Melanee Blew, MD; Location: [facility]; Service: General Surgery   REPAIR DURAL / CSF LEAK     to left sinus   TEE WITHOUT CARDIOVERSION N/A 11/20/2019   Procedure: TRANSESOPHAGEAL ECHOCARDIOGRAM (TEE);  Surgeon: Mady Bruckner, MD;  Location: [facility];  Service: Cardiovascular;  Laterality: N/A;   TOTAL HIP ARTHROPLASTY Left 08/19/2020   Procedure: TOTAL HIP ARTHROPLASTY ANTERIOR APPROACH;  Surgeon: Kathlynn Sharper, MD;  Location: [facility];  Service: Orthopedics;  Laterality: Left;   Family History  Problem Relation Age of Onset   Depression Daughter    Arthritis Daughter        spine   Plantar fasciitis Daughter    Anxiety  disorder Daughter    Hyperlipidemia Daughter    AAA (abdominal aortic aneurysm) Daughter    Hypertension Mother    Stroke Mother    Cataracts Mother    Depression Mother    Heart disease Mother    Asthma Brother    Diabetes Daughter    Hyperlipidemia Daughter    Hypertension Daughter    Breast cancer Cousin        maternal   Social History   Occupational History   Occupation: retired  Tobacco Use   Smoking status: Every Day    Current packs/day: 0.50    Average packs/day: 0.5 packs/day for 30.9 years (15.4 ttl pk-yrs)    Types: Cigarettes    Start date: 07/18/1982    Last attempt to quit: 07/17/2012    Smokeless tobacco: Never   Tobacco comments:     01/18/24 Currently smoking  1/2 ppd/pbt  Vaping Use   Vaping status: Never Used  Substance and Sexual Activity   Alcohol  use: Not Currently    Alcohol /week: 0.0 standard drinks of alcohol    Drug use: No   Sexual activity: Not Currently   Tobacco Counseling Ready to quit: Not Answered Counseling given: Not Answered Tobacco comments:  01/18/24 Currently smoking  1/2 ppd/pbt  SDOH Screenings   Food Insecurity: No Food Insecurity (01/18/2024)  Housing: Low Risk  (01/18/2024)  Transportation Needs: No Transportation Needs (01/18/2024)  Utilities: Not At Risk (01/18/2024)  Alcohol  Screen: Low Risk  (12/14/2022)  Depression (PHQ2-9): Low Risk  (01/18/2024)  Financial Resource Strain: Low Risk  (12/14/2022)  Physical Activity: Insufficiently Active (01/18/2024)  Social Connections: Moderately Isolated (01/18/2024)  Stress: No Stress Concern Present (01/18/2024)  Tobacco Use: High Risk (01/18/2024)  Health Literacy: Adequate Health Literacy (01/18/2024)   Depression Screen    01/18/2024   10:34 AM 11/03/2023   11:02 AM 08/04/2023    1:34 PM 05/03/2023    9:59 AM 12/14/2022   11:02 AM 10/20/2022   11:08 AM 09/23/2022   11:07 AM  PHQ 2/9 Scores  PHQ - 2 Score 0 0 0 0 0 0 0  PHQ- 9 Score 0 0  0  0   0  0      Data saved with a previous flowsheet row definition      Goals Addressed               This Visit's Progress     DIET - INCREASE WATER INTAKE (pt-stated)         Visit info / Clinical Intake: Medicare Wellness Visit Type:: Subsequent Annual Wellness Visit Persons participating in visit:: patient Medicare Wellness Visit Mode:: Telephone If telephone:: video declined Because this visit was a virtual/telehealth visit:: pt reported vitals If Telephone or Video please confirm:: I connected with the patient using audio enabled telemedicine application and verified that I am speaking with the correct person using two  identifiers; I discussed the limitations of evaluation and management by telemedicine; The patient expressed understanding and agreed to proceed Patient Location:: home Provider Location:: home Information given by:: patient Interpreter Needed?: No Pre-visit prep was completed: yes AWV questionnaire completed by patient prior to visit?: no Living arrangements:: lives with spouse/significant other Patient's Overall Health Status Rating: good Typical amount of pain: none Does pain affect daily life?: no Are you currently prescribed opioids?: no  Dietary Habits and Nutritional Risks How many meals a day?: 2 Eats fruit and vegetables daily?: yes (no fruit) Most meals are obtained by: preparing own meals; having  others provide food (daughter sometimes prepares foot, eats out on the weekends) In the last 2 weeks, have you had any of the following?: none Diabetic:: (!) yes Any non-healing wounds?: no How often do you check your BS?: 1 Would you like to be referred to a Nutritionist or for Diabetic Management? : no  Functional Status Activities of Daily Living (to include ambulation/medication): Independent Ambulation: Independent with device- listed below Home Assistive Devices/Equipment: Wheelchair; Walker (specify Type); Eyeglasses; Shower/tub chair; Elevated toliet seat (rollator and rolling walker, uses wheelchair mostly, reading glasses) Medication Administration: Independent Home Management: Independent Manage your own finances?: yes Primary transportation is: family/friends Concerns about vision?: no *vision screening is required for WTM* Concerns about hearing?: no  Fall Screening Falls in the past year?: 1 Number of falls in past year: 0 Was there an injury with Fall?: 1 Fall Risk Category Calculator: 2 Patient Fall Risk Level: Moderate Fall Risk  Fall Risk Patient at Risk for Falls Due to: History of fall(s); Impaired balance/gait; Impaired mobility; Orthopedic  patient Fall risk Follow up: Falls evaluation completed; Education provided  Home and Transportation Safety: All rugs have non-skid backing?: yes All stairs or steps have railings?: yes (also has a ramp) Grab bars in the bathtub or shower?: (!) no Have non-skid surface in bathtub or shower?: yes Good home lighting?: yes Regular seat belt use?: yes Hospital stays in the last year:: no  Cognitive Assessment Difficulty concentrating, remembering, or making decisions? : yes (making decisions) Will 6CIT or Mini Cog be Completed: yes What year is it?: 0 points What month is it?: 0 points Give patient an address phrase to remember (5 components): [address] [place] About what time is it?: 0 points Count backwards from 20 to 1: 0 points Say the months of the year in reverse: 0 points Repeat the address phrase from earlier: 2 points 6 CIT Score: 2 points  Advance Directives (For Healthcare) Does Patient Have a Medical Advance Directive?: Yes Does patient want to make changes to medical advance directive?: No - Patient declined Type of Advance Directive: Healthcare Power of Attorney Copy of Healthcare Power of Attorney in Chart?: Yes - validated most recent copy scanned in chart (See row information)  Reviewed/Updated  Reviewed/Updated: Reviewed All (Medical, Surgical, Family, Medications, Allergies, Care Teams, Patient Goals)        Objective:    Today's Vitals   01/18/24 1013  Weight: 128 lb (58.1 kg)  Height: 4' 11 (1.499 m)   Body mass index is 25.85 kg/m.  Current Medications (verified) Outpatient Encounter Medications as of 01/18/2024  Medication Sig   Alcohol  Swabs (ALCOHOL  PADS) 70 % PADS 1 each by Does not apply route in the morning and at bedtime.   alendronate  (FOSAMAX ) 70 MG tablet TAKE 1 TABLET BY MOUTH EVERY 7 DAYS WITH A FULL GLASS OF WATER ON AN EMPTY STOMACH   atorvastatin  (LIPITOR ) 20 MG tablet Take 1 tablet (20 mg total) by mouth daily.   Calcium   Carbonate-Vitamin D  600-400 MG-UNIT tablet Take 1 tablet by mouth daily.   Cholecalciferol  25 MCG (1000 UT) capsule Take 1,000 Units by mouth daily.   dapagliflozin  propanediol (FARXIGA ) 10 MG TABS tablet Take 10 mg by mouth daily. Receives through [place] patient assistance program   furosemide  (LASIX ) 20 MG tablet TAKE 1 TABLET (20 MG TOTAL) BY MOUTH DAILY.   insulin  degludec (TRESIBA  FLEXTOUCH) 100 UNIT/ML FlexTouch Pen Inject 28 Units into the skin daily. Patient receives via [employer] Patient Assistance  through December 2024   Insulin  Pen Needle (TRUEPLUS PEN NEEDLES) 31G X 6 MM MISC Use with lantus  two times daily.   losartan  (COZAAR ) 25 MG tablet TAKE 1 TABLET BY MOUTH DAILY   metoprolol  succinate (TOPROL -XL) 50 MG 24 hr tablet Take 1 tablet (50 mg total) by mouth daily.   montelukast  (SINGULAIR ) 10 MG tablet TAKE 1 TABLET (10 MG TOTAL) BY MOUTH AT BEDTIME.   Multiple Vitamins-Minerals (PRESERVISION AREDS 2 PO) Take 1 tablet by mouth in the morning and at bedtime.   Omega-3 1000 MG CAPS Take 1,000 mg by mouth daily.   omeprazole  (PRILOSEC) 40 MG capsule TAKE 1 CAPSULE BY MOUTH DAILY   OneTouch Delica Lancets 33G MISC To check blood sugar three times daily  DX: E11.42   ONETOUCH VERIO test strip Use to check blood sugar three times daily. DX E11.42   oxybutynin  (DITROPAN -XL) 10 MG 24 hr tablet TAKE 1 TABLET (10 MG TOTAL) BY MOUTH AT BEDTIME.   Semaglutide ,0.25 or 0.5MG /DOS, (OZEMPIC , 0.25 OR 0.5 MG/DOSE,) 2 MG/3ML SOPN Inject 0.5 mg into the skin once a week. Patient receives via [employer] Patient Assistance through December 2024   sertraline  (ZOLOFT ) 25 MG tablet TAKE 1 TABLET BY MOUTH DAILY   albuterol  (VENTOLIN  HFA) 108 (90 Base) MCG/ACT inhaler Inhale 2 puffs into the lungs every 4 (four) hours as needed for wheezing or shortness of breath. (Patient not taking: Reported on 01/18/2024)   clindamycin  (CLEOCIN ) 2 % vaginal cream Place 1 Applicatorful vaginally at bedtime. (Patient not  taking: Reported on 01/18/2024)   Cyanocobalamin  (B-12) 1000 MCG CAPS Take 1,000 mcg by mouth daily. (Patient not taking: Reported on 01/18/2024)   ketoconazole  (NIZORAL ) 2 % cream Apply 1 Application topically daily. (Patient not taking: Reported on 01/18/2024)   No facility-administered encounter medications on file as of 01/18/2024.   Hearing/Vision screen Hearing Screening - Comments:: Denies hearing loss  Vision Screening - Comments:: UTD @ [facility], Dr. Dingeldein Immunizations and Health Maintenance Health Maintenance  Topic Date Due   Colonoscopy  02/07/2022   FOOT EXAM  09/07/2023   Influenza Vaccine  10/07/2023   COVID-19 Vaccine (4 - 2025-26 season) 11/07/2023   Diabetic kidney evaluation - Urine ACR  12/30/2023   DTaP/Tdap/Td (1 - Tdap) 04/20/2024 (Originally 10/02/1964)   HEMOGLOBIN A1C  02/16/2024   OPHTHALMOLOGY EXAM  09/26/2024   DEXA SCAN  09/29/2024   Diabetic kidney evaluation - eGFR measurement  11/16/2024   Medicare Annual Wellness (AWV)  01/17/2025   Pneumococcal Vaccine: 50+ Years  Completed   Hepatitis C Screening  Completed   Zoster Vaccines- Shingrix  Completed   Meningococcal B Vaccine  Aged Out   Mammogram  Discontinued        Assessment/Plan:  This is a routine wellness examination for Yittel.  Patient Care Team: Ostwalt, Janna, PA-C as PCP - General (Physician Assistant) Perla Evalene PARAS, MD as PCP - Cardiology (Cardiology) Dingeldein, Elspeth, MD as Consulting Physician (Ophthalmology) Mavis Purchase, MD as Consulting Physician (Neurosurgery) Alana, Sharyle LABOR, RPH-CPP as Pharmacist Dew, Selinda RAMAN, MD as Referring Physician (Vascular Surgery)  I have personally reviewed and noted the following in the patient's chart:   Medical and social history Use of alcohol , tobacco or illicit drugs  Current medications and supplements including opioid prescriptions. Functional ability and status Nutritional status Physical  activity Advanced directives List of other physicians Hospitalizations, surgeries, and ER visits in previous 12 months Vitals Screenings to include cognitive, depression, and falls Referrals  and appointments  No orders of the defined types were placed in this encounter.  In addition, I have reviewed and discussed with patient certain preventive protocols, quality metrics, and best practice recommendations. A written personalized care plan for preventive services as well as general preventive health recommendations were provided to patient.   Vina Ned, CMA   01/18/2024   Return in about 1 year (around 01/17/2025).  After Visit Summary: (Mail) Due to this being a telephonic visit, the after visit summary with patients personalized plan was offered to patient via mail   Nurse Notes:  6 CIT Score - 2 FBS this morning per patient was 82 Needs flu vaccine at next OV on 02/08/24 Needs DM foot exam at next OV on 02/08/24 Needs DM Kidney evaluation at next OV on 02/08/24 Screening colonoscopy and MMG no longer recommended due to age.  Declined Tdap vaccine Declined DM & Nutrition education referral

## 2024-01-20 ENCOUNTER — Other Ambulatory Visit: Payer: Self-pay

## 2024-01-20 NOTE — Progress Notes (Unsigned)
 S:     Reason for visit: ?  Andrea [name] is a 78 y.o. female with a history of diabetes (type 2), who presents today for a follow up diabetes Telephone pharmacotherapy visit.? Pertinent PMH also includes ***.  Known DM Complications: {DM complications:33329}   Care Team: Primary Care Provider: Ostwalt, Janna, PA-C  At last visit, ***.   Patient reports Diabetes was diagnosed in ***.   Current diabetes medications include: Farxiga  10 mg daily, Ozempic  0.5 mg weekly, Tresiba  28 units daily Previous diabetes medications include: Trulicity , Jardiance  Current hypertension medications include: losartan  25 mg daily, metoprolol  succinate 50 mg daily, furosemide  20 mg daily  Current hyperlipidemia medications include: atorvastatin  20 mg daily   Patient reports adherence to taking all medications as prescribed.  *** Patient denies adherence with medications, reports missing *** medications *** times per week, on average.  Have you been experiencing any side effects to the medications prescribed? {YES NO:22349} Do you have any problems obtaining medications due to transportation or finances? {YES [facility] Insurance coverage: [employer] + LIS  Current medication access support: ***  Patient {Actions; denies-reports:120008} hypoglycemic events.  Reported home fasting blood sugars: ***  Reported 2 hour post-meal/random blood sugars: ***.  Patient {Actions; denies-reports:120008} nocturia (nighttime urination).  Patient {Actions; denies-reports:120008} neuropathy (nerve pain). Patient {Actions; denies-reports:120008} visual changes. Patient {Actions; denies-reports:120008} self foot exams.   Patient reported dietary habits: Eats *** meals/day Breakfast: *** Lunch: *** Dinner: *** Snacks: *** Drinks: ***  Patient-reported exercise habits: *** DM Prevention:  Statin: {Blank single:19197::***,Taking,Not taking,Intolerant to,Declines}; {Blank  single:19197::low intensity,moderate intensity,high intensity,n/a}.?  ACE/ARB: {Blank single:19197::yes,no}; *** History of chronic kidney disease? {Blank single:19197::yes,no} Last urinary albumin /creatinine ratio:  Lab Results  Component Value Date   MICRALBCREAT 54 (H) 12/30/2022   MICRALBCREAT 87 (H) 06/10/2022   MICRALBCREAT 138 (H) 12/10/2021   Last eye exam:  Lab Results  Component Value Date   HMDIABEYEEXA No Retinopathy 09/27/2023   Lab Results  Component Value Date   HMDIABEYEEXA No Retinopathy 09/27/2023   Last foot exam: No foot exam found Tobacco Use:  Tobacco Use: High Risk (01/18/2024)   Patient History    Smoking Tobacco Use: Every Day    Smokeless Tobacco Use: Never    Passive Exposure: Not on file   O:  {CGM reports:33570} 7 day average blood glucose: ***  Libre3 CGM Download today *** % Time CGM is active: ***% Average Glucose: *** mg/dL Glucose Management Indicator: ***  Glucose Variability: ***% (goal <36%) Time in Goal:  - Time in range 70-180: ***% - Time above range: ***% - Time below range: ***% Observed patterns:  Vitals:  Wt Readings from Last 3 Encounters:  01/18/24 128 lb (58.1 kg)  11/03/23 128 lb 4.8 oz (58.2 kg)  08/17/23 126 lb (57.2 kg)   BP Readings from Last 3 Encounters:  11/03/23 138/61  08/17/23 114/65  08/04/23 117/72   Pulse Readings from Last 3 Encounters:  11/03/23 74  08/04/23 94  07/22/23 74     Labs:?  Lab Results  Component Value Date   HGBA1C 6.6 (H) 11/17/2023   HGBA1C 7.1 (H) 05/23/2023   HGBA1C 7.3 (H) 12/30/2022   GLUCOSE 96 11/17/2023   MICRALBCREAT 54 (H) 12/30/2022   MICRALBCREAT 87 (H) 06/10/2022   MICRALBCREAT 138 (H) 12/10/2021   CREATININE 1.41 (H) 11/17/2023   CREATININE 1.31 (H) 05/23/2023   CREATININE 1.48 (H) 12/30/2022    Lab Results  Component Value Date   CHOL 127 05/23/2023  LDLCALC 43 05/23/2023   LDLCALC 42 12/30/2022   LDLCALC 61 06/10/2022   HDL 62  05/23/2023   TRIG 875 05/23/2023   TRIG 141 12/30/2022   TRIG 157 (H) 06/10/2022   ALT 11 11/17/2023   ALT 10 05/23/2023   AST 14 11/17/2023   AST 10 05/23/2023      Chemistry      Component Value Date/Time   NA 142 11/17/2023 1104   NA 143 01/01/2014 0643   K 4.8 11/17/2023 1104   K 4.2 01/01/2014 0643   CL 103 11/17/2023 1104   CL 105 01/01/2014 0643   CO2 22 11/17/2023 1104   CO2 30 01/01/2014 0643   BUN 37 (H) 11/17/2023 1104   BUN 17 01/01/2014 0643   CREATININE 1.41 (H) 11/17/2023 1104   CREATININE 1.08 (H) 12/16/2016 1539   GLU 243 06/03/2021 1644      Component Value Date/Time   CALCIUM  9.7 11/17/2023 1104   CALCIUM  8.3 (L) 01/01/2014 0643   ALKPHOS 120 11/17/2023 1104   AST 14 11/17/2023 1104   ALT 11 11/17/2023 1104   BILITOT 0.5 11/17/2023 1104       The ASCVD Risk score (Arnett DK, et al., 2019) failed to calculate for the following reasons:   Risk score cannot be calculated because patient has a medical history suggesting prior/existing ASCVD  Lab Results  Component Value Date   MICRALBCREAT 54 (H) 12/30/2022   MICRALBCREAT 87 (H) 06/10/2022   MICRALBCREAT 138 (H) 12/10/2021    A/P: Diabetes currently *** with a most recent A1c of *** on ***, which is {DESC; UP/DOWN/UNCHANGED:18711} from *** on ***. Patient is *** able to verbalize appropriate hypoglycemia management plan. Medication adherence appears ***. Control is suboptimal due to ***. -{Meds adjust:18428} basal insulin  {basal insulins:33573}  *** units daily.  -{Meds adjust:18428} rapid insulin  {bolus insulin :33574} ***.  -{Meds adjust:18428} GLP-1 {GLP1 options:33572} *** mg .  -{Meds adjust:18428} SGLT2-I {SGLT2i options:33571}*** mg daily.  -{Meds adjust:18428} metformin ***.  -Patient educated on purpose, proper use, and potential adverse effects of ***.  -Extensively discussed pathophysiology of diabetes, recommended lifestyle interventions, dietary effects on blood sugar control.   -Counseled on s/sx of and management of hypoglycemia.  -Next A1c anticipated ***.   ASCVD risk - {primary/secondary:33575} prevention in patient with diabetes. Last LDL is *** mg/dL, not at goal of <29 *** mg/dL. ASCVD risk factors include *** and 10-year ASCVD risk score of ***. {Desc; low/moderate/high:110033} intensity statin indicated.  -{Meds adjust:18428} {statin therapies:33576} *** mg daily.  -{Meds adjust:18428} ezetimibe 10 mg daily   Hypertension longstanding *** currently ***. Blood pressure goal of <130/80 *** mmHg. Medication adherence ***. Blood pressure control is suboptimal due to ***. -{Meds adjust:18428} *** mg ***.  {pharmacisttime:33368}  Follow-up:  Pharmacist on *** PCP clinic visit on ***  [name], PharmD, CPP Clinical Pharmacist [facility] [phone] '

## 2024-01-23 NOTE — Progress Notes (Signed)
 Andrea [name]                                          MRN: [id number]   01/23/2024   The [facility] Quality Team Specialist reviewed this patient medical record for the purposes of chart review for care gap closure. The following were reviewed: abstraction for care gap closure-glycemic status assessment.    [facility] Quality Team

## 2024-01-26 ENCOUNTER — Other Ambulatory Visit (INDEPENDENT_AMBULATORY_CARE_PROVIDER_SITE_OTHER)

## 2024-01-26 DIAGNOSIS — E785 Hyperlipidemia, unspecified: Secondary | ICD-10-CM

## 2024-01-26 DIAGNOSIS — E1169 Type 2 diabetes mellitus with other specified complication: Secondary | ICD-10-CM

## 2024-01-26 DIAGNOSIS — Z794 Long term (current) use of insulin: Secondary | ICD-10-CM

## 2024-01-26 MED ORDER — FREESTYLE LIBRE 3 READER DEVI: 0 refills | Status: DC

## 2024-01-26 MED ORDER — TRESIBA FLEXTOUCH 100 UNIT/ML ~~LOC~~ SOPN: 22.0000 [IU] | PEN_INJECTOR | Freq: Every day | SUBCUTANEOUS | 1 refills | Status: DC

## 2024-01-26 MED ORDER — FREESTYLE LIBRE 3 PLUS SENSOR MISC: 3 refills | Status: DC

## 2024-01-26 NOTE — Progress Notes (Signed)
 S:     Reason for visit: ?  Andrea [name] is a 78 y.o. female with a history of diabetes (type 2), who presents today for a follow up diabetes Telephone pharmacotherapy visit.? Pertinent PMH also includes CAD, PAD, HFrEF, HTN, GERD, HLD, hx of TIA and MI.   Care Team: Primary Care Provider: Ostwalt, Janna, PA-C  Current diabetes medications include: Farxiga  10 mg daily, Ozempic  0.5 mg weekly, Tresiba  28 units daily Previous diabetes medications include: Trulicity , Jardiance  Current hypertension medications include: losartan  25 mg daily, metoprolol  succinate 50 mg daily, furosemide  20 mg daily  Current hyperlipidemia medications include: atorvastatin  20 mg daily, Omega-3 1000 mg   Patient reports adherence to taking all medications as prescribed. Reports daughter helps management medications.  Have you been experiencing any side effects to the medications prescribed? no Do you have any problems obtaining medications due to transportation or finances? no Insurance coverage: [employer] + LIS  Patient reports hypoglycemic events in the morning every other week. Daughter reports that the patient does not have any symptoms or awareness when she is hypoglycemic.   Reported home fasting blood sugars: normally in the 80s; 68 mg/dL this morning  Patient reported dietary habits: Eats 2 meals/day Reports decrease in appetite DM Prevention:  Statin: Taking; moderate intensity.?  ACE/ARB: yes; losartan  History of chronic kidney disease? yes Last urinary albumin /creatinine ratio:  Lab Results  Component Value Date   MICRALBCREAT 54 (H) 12/30/2022   MICRALBCREAT 87 (H) 06/10/2022   MICRALBCREAT 138 (H) 12/10/2021   Last eye exam:  Lab Results  Component Value Date   HMDIABEYEEXA No Retinopathy 09/27/2023   Lab Results  Component Value Date   HMDIABEYEEXA No Retinopathy 09/27/2023   Last foot exam: No foot exam found Tobacco Use:  Tobacco Use: High Risk  (01/18/2024)   Patient History    Smoking Tobacco Use: Every Day    Smokeless Tobacco Use: Never    Passive Exposure: Not on file   O:   Vitals:  Wt Readings from Last 3 Encounters:  01/18/24 128 lb (58.1 kg)  11/03/23 128 lb 4.8 oz (58.2 kg)  08/17/23 126 lb (57.2 kg)   BP Readings from Last 3 Encounters:  11/03/23 138/61  08/17/23 114/65  08/04/23 117/72   Pulse Readings from Last 3 Encounters:  11/03/23 74  08/04/23 94  07/22/23 74     Labs:?  Lab Results  Component Value Date   HGBA1C 6.6 (H) 11/17/2023   HGBA1C 7.1 (H) 05/23/2023   HGBA1C 7.3 (H) 12/30/2022   GLUCOSE 96 11/17/2023   MICRALBCREAT 54 (H) 12/30/2022   MICRALBCREAT 87 (H) 06/10/2022   MICRALBCREAT 138 (H) 12/10/2021   CREATININE 1.41 (H) 11/17/2023   CREATININE 1.31 (H) 05/23/2023   CREATININE 1.48 (H) 12/30/2022    Lab Results  Component Value Date   CHOL 127 05/23/2023   LDLCALC 43 05/23/2023   LDLCALC 42 12/30/2022   LDLCALC 61 06/10/2022   HDL 62 05/23/2023   TRIG 124 05/23/2023   TRIG 141 12/30/2022   TRIG 157 (H) 06/10/2022   ALT 11 11/17/2023   ALT 10 05/23/2023   AST 14 11/17/2023   AST 10 05/23/2023      Chemistry      Component Value Date/Time   NA 142 11/17/2023 1104   NA 143 01/01/2014 0643   K 4.8 11/17/2023 1104   K 4.2 01/01/2014 0643   CL 103 11/17/2023 1104   CL 105 01/01/2014 9356  CO2 22 11/17/2023 1104   CO2 30 01/01/2014 0643   BUN 37 (H) 11/17/2023 1104   BUN 17 01/01/2014 0643   CREATININE 1.41 (H) 11/17/2023 1104   CREATININE 1.08 (H) 12/16/2016 1539   GLU 243 06/03/2021 1644      Component Value Date/Time   CALCIUM  9.7 11/17/2023 1104   CALCIUM  8.3 (L) 01/01/2014 0643   ALKPHOS 120 11/17/2023 1104   AST 14 11/17/2023 1104   ALT 11 11/17/2023 1104   BILITOT 0.5 11/17/2023 1104       The ASCVD Risk score (Arnett DK, et al., 2019) failed to calculate for the following reasons:   Risk score cannot be calculated because patient has a medical  history suggesting prior/existing ASCVD  Lab Results  Component Value Date   MICRALBCREAT 54 (H) 12/30/2022   MICRALBCREAT 87 (H) 06/10/2022   MICRALBCREAT 138 (H) 12/10/2021    A/P: Diabetes currently controlled with a most recent A1c of 6.6% on 11/17/23. 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 Additionally, it is likely that patient is holding onto her ultra-long acting insulin  given CKD dx. Will decrease basal insulin  today and may need to switch to a long-acting version in the future (such as Lantus ).  Patient would benefit from CGM given hypoglycemic symptoms. Could possibly increase GLP1 dose to attempt down-titration  and/or discontinuation of insulin  therapy, however, will have to monitor for any weight loss.  -Decreased dose of basal insulin  Tresiba  (insulin  degludec)  from 28 to 22 units daily. Counseled to take in the morning to avoid overnight hypoglycemia -Continued GLP-1 Ozempic  (semaglutide ) 0.5 mg weekly.  -Continued SGLT2-I Farxiga  (dapagliflozin )10 mg daily.  -Patient educated on purpose, proper use, and potential adverse effects of insulin .  -Extensively discussed pathophysiology of diabetes, recommended lifestyle interventions, dietary effects on blood sugar control.  -Counseled on s/sx of and management of hypoglycemia.  -Sent prescription for CGM supplies to pharmacy. Will present with supplies to clinic for education and placement.   ASCVD risk - secondary prevention in patient with diabetes. Last LDL is 43 mg/dL, at goal of <44 mg/dL.  -Continued atorvastatin  20 mg daily.   Patient verbalized understanding of treatment plan. Total time patient counseling 30 minutes.  Follow-up:  Pharmacist on 02/08/24 PCP clinic visit on 02/08/24  Andrea [name], PharmD, CPP Clinical Pharmacist [facility] [phone] '

## 2024-02-03 ENCOUNTER — Telehealth: Payer: Self-pay

## 2024-02-03 NOTE — Telephone Encounter (Signed)
 Received refill re-order form from [employer] (Tresiba ) filled and faxed to provider office to sign and date.

## 2024-02-03 NOTE — Progress Notes (Signed)
 Established patient visit  Patient: Andrea [name]   DOB: 1945/08/17   78 y.o. Female  MRN: [id number] Visit Date: 02/08/2024  Today's healthcare provider: Jolynn Spencer, PA-C   Chief Complaint  Patient presents with   Medical Management of Chronic Issues    3 month f/u    Subjective     Discussed the use of AI scribe software for clinical note transcription with the patient, who gave verbal consent to proceed.  History of Present Illness Andrea [name] is a 78 year old female with diabetes who presents for follow-up of her diabetes management.  Her last A1c was 6.6 in September with improvement. She uses Tresiba , recently reduced from 28 to 22 units due to low blood sugar episodes, though she does not feel shaky. She also takes Ozempic  0.5 mg and Farxiga . She eats once a day because of poor appetite, which she attributes to Ozempic .  She takes metoprolol  50 mg, losartan  25 mg, and furosemide  20 mg daily for hypertension.  She has osteoporosis and takes alendronate . She is fearful of walking because of risk of falling. She last attended physical therapy a couple of years ago after a hospital stay.  She has peripheral artery disease.  She has anxiety and depression but feels better when with her caregiver. She is not taking medication for these conditions.  She smokes and denies visual problems. She saw an eye doctor in August with follow-up planned in six months.  She denies chest pain, shortness of breath, palpitations, and bowel movement issues.  Per pharmacist advise from 01/26/24, A dose of tresiba  was decreased to 22 units daily AM to avoid overnight hypoglycemia A rx for CGM supplies were sent to pharmacy      01/18/2024   10:34 AM 11/03/2023   11:02 AM 08/04/2023    1:34 PM  Depression screen PHQ 2/9  Decreased Interest 0 0 0  Down, Depressed, Hopeless 0 0 0  PHQ - 2 Score 0 0 0  Altered sleeping 0 0 0  Tired, decreased energy 0 0 0  Change in appetite 0 0 0   Feeling bad or failure about yourself   0 0  Trouble concentrating 0 0 0  Moving slowly or fidgety/restless 0 0 0  Suicidal thoughts 0 0 0  PHQ-9 Score 0 0  0   Difficult doing work/chores Not difficult at all Not difficult at all Not difficult at all     Data saved with a previous flowsheet row definition      11/03/2023   11:02 AM 05/03/2023   10:00 AM 06/23/2021    2:52 PM  GAD 7 : Generalized Anxiety Score  Nervous, Anxious, on Edge 0 0 3  Control/stop worrying 0 0 1  Worry too much - different things 0 0 2  Trouble relaxing 0 0 0  Restless 0 0 0  Easily annoyed or irritable 0 0 1  Afraid - awful might happen 0 0 1  Total GAD 7 Score 0 0 8  Anxiety Difficulty Not difficult at all Not difficult at all Not difficult at all    Medications: Outpatient Medications Prior to Visit  Medication Sig   alendronate  (FOSAMAX ) 70 MG tablet TAKE 1 TABLET BY MOUTH EVERY 7 DAYS WITH A FULL GLASS OF WATER ON AN EMPTY STOMACH   atorvastatin  (LIPITOR ) 20 MG tablet Take 1 tablet (20 mg total) by mouth daily.   Calcium  Carbonate-Vitamin D  600-400 MG-UNIT tablet Take 1 tablet by mouth daily.  Cholecalciferol  25 MCG (1000 UT) capsule Take 1,000 Units by mouth daily.   Continuous Glucose Receiver (FREESTYLE LIBRE 3 READER) DEVI Use to monitor blood sugar with Freestyle Libre 3+ sensors   Continuous Glucose Sensor (FREESTYLE LIBRE 3 PLUS SENSOR) MISC Change sensor every 15 days.   Cyanocobalamin  (B-12) 1000 MCG CAPS Take 1,000 mcg by mouth daily.   dapagliflozin  propanediol (FARXIGA ) 10 MG TABS tablet Take 10 mg by mouth daily. Receives through [place] patient assistance [place]   furosemide  (LASIX ) 20 MG tablet TAKE 1 TABLET (20 MG TOTAL) BY MOUTH DAILY.   insulin  degludec (TRESIBA  FLEXTOUCH) 100 UNIT/ML FlexTouch Pen Inject 22 Units into the skin daily.   Insulin  Pen Needle (TRUEPLUS PEN NEEDLES) 31G X 6 MM MISC Use with lantus  two times daily.   losartan  (COZAAR ) 25 MG tablet TAKE 1 TABLET BY  MOUTH DAILY   metoprolol  succinate (TOPROL -XL) 50 MG 24 hr tablet Take 1 tablet (50 mg total) by mouth daily.   montelukast  (SINGULAIR ) 10 MG tablet TAKE 1 TABLET (10 MG TOTAL) BY MOUTH AT BEDTIME.   Multiple Vitamins-Minerals (PRESERVISION AREDS 2 PO) Take 1 tablet by mouth in the morning and at bedtime.   Omega-3 1000 MG CAPS Take 1,000 mg by mouth daily.   omeprazole  (PRILOSEC) 40 MG capsule TAKE 1 CAPSULE BY MOUTH DAILY   OneTouch Delica Lancets 33G MISC To check blood sugar three times daily  DX: E11.42   ONETOUCH VERIO test strip Use to check blood sugar three times daily. DX E11.42   oxybutynin  (DITROPAN -XL) 10 MG 24 hr tablet TAKE 1 TABLET (10 MG TOTAL) BY MOUTH AT BEDTIME.   Semaglutide ,0.25 or 0.5MG /DOS, (OZEMPIC , 0.25 OR 0.5 MG/DOSE,) 2 MG/3ML SOPN Inject 0.5 mg into the skin once a week. Patient receives via [employer] Patient Assistance through December 2024   sertraline  (ZOLOFT ) 25 MG tablet TAKE 1 TABLET BY MOUTH DAILY   No facility-administered medications prior to visit.    Review of Systems  All other systems reviewed and are negative.  All negative Except see HPI       Objective    BP (!) 131/55   Pulse 79   Resp 14   Ht 4' 11 (1.499 m)   Wt 128 lb (58.1 kg)   SpO2 100%   BMI 25.85 kg/m     Physical Exam Vitals reviewed.  Constitutional:      General: She is not in acute distress.    Appearance: Normal appearance. She is well-developed. She is not diaphoretic.  HENT:     Head: Normocephalic and atraumatic.  Eyes:     General: No scleral icterus.    Conjunctiva/sclera: Conjunctivae normal.  Neck:     Thyroid : No thyromegaly.  Cardiovascular:     Rate and Rhythm: Normal rate and regular rhythm.     Pulses: Normal pulses.     Heart sounds: Normal heart sounds. No murmur heard. Pulmonary:     Effort: Pulmonary effort is normal. No respiratory distress.     Breath sounds: Normal breath sounds. No wheezing, rhonchi or rales.  Musculoskeletal:      Cervical back: Neck supple.     Right lower leg: No edema.     Left lower leg: No edema.  Lymphadenopathy:     Cervical: No cervical adenopathy.  Skin:    General: Skin is warm and dry.     Findings: No rash.  Neurological:     Mental Status: She is alert and oriented to person, place,  and time. Mental status is at baseline.  Psychiatric:        Mood and Affect: Mood normal.        Behavior: Behavior normal.      No results found for any visits on 02/08/24.      Assessment & Plan Type 2 diabetes mellitus Chronic and stable Associated with HTN, hld A1c improved to 6.6%. Tresiba  dose reduced due to hypoglycemia. Discussed continuous glucose monitoring to prevent hypoglycemic episodes. - Continue Tresiba  22 units, Ozempic  0.5 mg, and Farxiga . - Discussed dietary habits and considered referral to a nutritionist. Will follow-up  Hypertension Associated with DMII Chronic and stable Blood pressure well-controlled on current regimen. - Continue metoprolol  50 mg, losartan  25 mg, and furosemide  20 mg daily. Continue lifestyle modifications Will follow-up  Hyperlipidemia Associated with DMII Chronic and stable with last ldl 43, <55 Continues on medication for cardiovascular prevention. - Continue cholesterol medication:atorvastatin  20 mg daily.. Continue lifestyle modification Will follow-up  Osteoporosis Chronic On alendronate . Emphasized exercise for bone density and fall prevention. - Continue alendronate . - Encouraged regular exercise and considered referral to physical therapy. Advised to check with her insurance if [facility] PT will be covered Will follow-up  Mobility impaired Uses wheelchair Do not exercise, has limited mobility Advised to check with her insurance if [facility] PT will be covered Will follow-up  Peripheral artery disease Requires exercise to prevent worsening. Discussed fall risk and suggested home health physical therapy. - Encouraged regular walking and  exercise. - Considered referral to home health physical therapy. Advised to check with her insurance if [facility] PT will be covered Will follow-up  Nicotine dependence Continues to smoke. Discussed smoking cessation benefits and national quit line resources. - Encouraged smoking cessation. - Provided information on national quit line resources.  Anxiety Discussed in-clinic counseling for management. - Offered in-clinic counseling services. Will reassess at the follow-up  Type 2 diabetes mellitus with hyperlipidemia (HCC) (Primary)  - Urine Microalbumin w/creat. ratio  Immunization due Flu vaccine, was administered by Hargis Jubilee, CMA   Orders Placed This Encounter  Procedures   Flu vaccine HIGH DOSE PF(Fluzone Trivalent)   Urine Microalbumin w/creat. ratio    Return in about 2 months (around 04/10/2024) for chronic disease f/u.   The patient was advised to call back or seek an in-person evaluation if the symptoms worsen or if the condition fails to improve as anticipated.  I discussed the assessment and treatment plan with the patient. The patient was provided an opportunity to ask questions and all were answered. The patient agreed with the plan and demonstrated an understanding of the instructions.  I, Aicia Babinski, PA-C have reviewed all documentation for this visit. The documentation on 02/08/2024  for the exam, diagnosis, procedures, and orders are all accurate and complete.  Jolynn Spencer, [facility], MMS [facility] [phone] (phone) [phone] (fax)  [facility]

## 2024-02-06 ENCOUNTER — Ambulatory Visit: Admitting: Physician Assistant

## 2024-02-08 ENCOUNTER — Ambulatory Visit

## 2024-02-08 ENCOUNTER — Ambulatory Visit: Admitting: Physician Assistant

## 2024-02-08 ENCOUNTER — Encounter: Payer: Self-pay | Admitting: Physician Assistant

## 2024-02-08 VITALS — BP 131/55 | HR 79 | Resp 14 | Ht 59.0 in | Wt 128.0 lb

## 2024-02-08 DIAGNOSIS — E1169 Type 2 diabetes mellitus with other specified complication: Secondary | ICD-10-CM

## 2024-02-08 DIAGNOSIS — Z794 Long term (current) use of insulin: Secondary | ICD-10-CM

## 2024-02-08 DIAGNOSIS — F419 Anxiety disorder, unspecified: Secondary | ICD-10-CM | POA: Insufficient documentation

## 2024-02-08 DIAGNOSIS — E0843 Diabetes mellitus due to underlying condition with diabetic autonomic (poly)neuropathy: Secondary | ICD-10-CM

## 2024-02-08 DIAGNOSIS — E785 Hyperlipidemia, unspecified: Secondary | ICD-10-CM

## 2024-02-08 DIAGNOSIS — Z23 Encounter for immunization: Secondary | ICD-10-CM | POA: Insufficient documentation

## 2024-02-08 DIAGNOSIS — I739 Peripheral vascular disease, unspecified: Secondary | ICD-10-CM

## 2024-02-08 DIAGNOSIS — I10 Essential (primary) hypertension: Secondary | ICD-10-CM

## 2024-02-08 DIAGNOSIS — E1159 Type 2 diabetes mellitus with other circulatory complications: Secondary | ICD-10-CM

## 2024-02-08 DIAGNOSIS — F17200 Nicotine dependence, unspecified, uncomplicated: Secondary | ICD-10-CM | POA: Insufficient documentation

## 2024-02-08 DIAGNOSIS — M810 Age-related osteoporosis without current pathological fracture: Secondary | ICD-10-CM

## 2024-02-08 DIAGNOSIS — Z7409 Other reduced mobility: Secondary | ICD-10-CM

## 2024-02-08 DIAGNOSIS — K219 Gastro-esophageal reflux disease without esophagitis: Secondary | ICD-10-CM

## 2024-02-08 NOTE — Progress Notes (Unsigned)
 S:     Reason for visit: ?  Andrea [name] is a 78 y.o. female with a history of diabetes (type 2), who presents today for a follow up diabetes Face to Face pharmacotherapy visit.? Pertinent PMH also includes CAD, PAD, HFrEF, HTN, GERD, HLD, hx of TIA and MI.   Care Team: Primary Care Provider: Ostwalt, Janna, PA-C4  Patient presented to clinic today for education and placement of CGM.   Current diabetes medications include: Farxiga  10 mg daily, Ozempic  0.5 mg weekly, Tresiba  22 units daily Previous diabetes medications include: Trulicity , Jardiance  Current hypertension medications include: losartan  25 mg daily, metoprolol  succinate 50 mg daily, furosemide  20 mg daily  Current hyperlipidemia medications include: atorvastatin  20 mg daily, Omega-3 1000 mg   Patient reports adherence to taking all medications as prescribed. Reports daughter helps management medications.  Have you been experiencing any side effects to the medications prescribed? no Do you have any problems obtaining medications due to transportation or finances? no Insurance coverage: [employer] + LIS  Patient denies any hypoglycemic events since Tresiba  dose was decreased.  Reported home fasting blood sugars: 80-110 mg/dL  Patient reported dietary habits: Eats 2 meals/day Reports decrease in appetite DM Prevention:  Statin: Taking; moderate intensity.?  ACE/ARB: yes; losartan  History of chronic kidney disease? yes Last urinary albumin /creatinine ratio:  Lab Results  Component Value Date   MICRALBCREAT 54 (H) 12/30/2022   MICRALBCREAT 87 (H) 06/10/2022   MICRALBCREAT 138 (H) 12/10/2021   Last eye exam:  Lab Results  Component Value Date   HMDIABEYEEXA No Retinopathy 09/27/2023   Lab Results  Component Value Date   HMDIABEYEEXA No Retinopathy 09/27/2023   Last foot exam: No foot exam found Tobacco Use:  Tobacco Use: High Risk (01/18/2024)   Patient History    Smoking Tobacco Use:  Every Day    Smokeless Tobacco Use: Never    Passive Exposure: Not on file   O:   Vitals:  Wt Readings from Last 3 Encounters:  01/18/24 128 lb (58.1 kg)  11/03/23 128 lb 4.8 oz (58.2 kg)  08/17/23 126 lb (57.2 kg)   BP Readings from Last 3 Encounters:  11/03/23 138/61  08/17/23 114/65  08/04/23 117/72   Pulse Readings from Last 3 Encounters:  11/03/23 74  08/04/23 94  07/22/23 74     Labs:?  Lab Results  Component Value Date   HGBA1C 6.6 (H) 11/17/2023   HGBA1C 7.1 (H) 05/23/2023   HGBA1C 7.3 (H) 12/30/2022   GLUCOSE 96 11/17/2023   MICRALBCREAT 54 (H) 12/30/2022   MICRALBCREAT 87 (H) 06/10/2022   MICRALBCREAT 138 (H) 12/10/2021   CREATININE 1.41 (H) 11/17/2023   CREATININE 1.31 (H) 05/23/2023   CREATININE 1.48 (H) 12/30/2022    Lab Results  Component Value Date   CHOL 127 05/23/2023   LDLCALC 43 05/23/2023   LDLCALC 42 12/30/2022   LDLCALC 61 06/10/2022   HDL 62 05/23/2023   TRIG 124 05/23/2023   TRIG 141 12/30/2022   TRIG 157 (H) 06/10/2022   ALT 11 11/17/2023   ALT 10 05/23/2023   AST 14 11/17/2023   AST 10 05/23/2023      Chemistry      Component Value Date/Time   NA 142 11/17/2023 1104   NA 143 01/01/2014 0643   K 4.8 11/17/2023 1104   K 4.2 01/01/2014 0643   CL 103 11/17/2023 1104   CL 105 01/01/2014 0643   CO2 22 11/17/2023 1104   CO2 30  01/01/2014 0643   BUN 37 (H) 11/17/2023 1104   BUN 17 01/01/2014 0643   CREATININE 1.41 (H) 11/17/2023 1104   CREATININE 1.08 (H) 12/16/2016 1539   GLU 243 06/03/2021 1644      Component Value Date/Time   CALCIUM  9.7 11/17/2023 1104   CALCIUM  8.3 (L) 01/01/2014 0643   ALKPHOS 120 11/17/2023 1104   AST 14 11/17/2023 1104   ALT 11 11/17/2023 1104   BILITOT 0.5 11/17/2023 1104       The ASCVD Risk score (Arnett DK, et al., 2019) failed to calculate for the following reasons:   Risk score cannot be calculated because patient has a medical history suggesting prior/existing ASCVD  Lab Results   Component Value Date   MICRALBCREAT 54 (H) 12/30/2022   MICRALBCREAT 87 (H) 06/10/2022   MICRALBCREAT 138 (H) 12/10/2021    A/P: Diabetes currently controlled with a most recent A1c of 6.6% on 11/17/23. 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 Additionally, it is likely that patient is holding onto her ultra-long acting insulin  given CKD dx. May need to switch to a long-acting version in the future (such as Lantus ).   -Continued basal insulin  Tresiba  (insulin  degludec)  from 22 units daily. Counseled to take in the morning to avoid overnight hypoglycemia -Continued GLP-1 Ozempic  (semaglutide ) 0.5 mg weekly.  -Continued SGLT2-I Farxiga  (dapagliflozin )10 mg daily.  -Patient educated on purpose, proper use, and potential adverse effects of insulin .  -Extensively discussed pathophysiology of diabetes, recommended lifestyle interventions, dietary effects on blood sugar control.  -Counseled on s/sx of and management of hypoglycemia.  -Placed and educated on Freestyle Libre 3+. Provided sensor and reader sample in clinic.  ASCVD risk - secondary prevention in patient with diabetes. Last LDL is 43 mg/dL, at goal of <44 mg/dL.  -Continued atorvastatin  20 mg daily.   Patient verbalized understanding of treatment plan. Total time patient counseling 30 minutes.  Follow-up:  Pharmacist on 04/11/24 PCP clinic visit on 04/11/24  [name], PharmD, CPP Clinical Pharmacist [facility] [phone] '

## 2024-02-09 LAB — MICROALBUMIN / CREATININE URINE RATIO
Creatinine, Urine: 22.5 mg/dL
Microalb/Creat Ratio: 78 mg/g{creat} — ABNORMAL HIGH (ref 0–29)
Microalbumin, Urine: 17.5 ug/mL

## 2024-02-10 ENCOUNTER — Telehealth: Payer: Self-pay

## 2024-02-10 NOTE — Telephone Encounter (Signed)
 Contacted [facility] pharmacy to determine delay with shipment of Freestyle Libre 3+ sensors and reader that was sent on 01/26/24. Representative showed that a quantity was used for each, but not billed or shipped. Unable to provide any further clarification. Representative forwarded request to pharmacist as a high priority to return call.  Patient was provided a sample of Freestyle Libre 3+ reader and sensor in clinic on 02/08/24 in the interim.  Nikolay Demetriou E. Marsh, PharmD, CPP Clinical Pharmacist [facility] [phone]

## 2024-02-13 NOTE — Telephone Encounter (Signed)
 PAP done online [employer] (tresiba ) faxed provider portion.

## 2024-02-14 ENCOUNTER — Telehealth: Payer: Self-pay | Admitting: Pharmacy Technician

## 2024-02-14 ENCOUNTER — Other Ambulatory Visit (HOSPITAL_COMMUNITY): Payer: Self-pay

## 2024-02-14 NOTE — Telephone Encounter (Signed)
 Pharmacy Patient Advocate Encounter   Received notification from Onbase/pharmacy that prior authorization for [facility] 3 Reader is required/requested. Engineer, [facility] [employer] [facility] [phone])   Insurance verification completed.   The patient is insured through [facility].   Per test claim: PA required; PA started via CoverMyMeds. KEY [id number] . Waiting for clinical questions to populate.

## 2024-02-15 ENCOUNTER — Ambulatory Visit: Payer: Self-pay | Admitting: Physician Assistant

## 2024-02-17 NOTE — Telephone Encounter (Signed)
 Per fill history, Freestyle Libre 3+ sensor was filled on 02/10/24.  Armilda Vanderlinden E. Marsh, PharmD, CPP Clinical Pharmacist [facility] [phone]

## 2024-02-20 ENCOUNTER — Other Ambulatory Visit (HOSPITAL_COMMUNITY): Payer: Self-pay

## 2024-02-20 NOTE — Telephone Encounter (Signed)
 Pharmacy Patient Advocate Encounter  Received notification from [facility] that Prior Authorization for [facility] 3 Reader has been NA - [facility] is not able to process this request through ePA, please contact the plan at [phone] Called to see if it has to be done through DME or over the phone or what.   She was able to initiate a new PA - only asked for the diagnosis code. NEW PA #/Case ID/Reference #: [id number]- should receive a fax determination in 72 hours on average.

## 2024-02-22 ENCOUNTER — Telehealth: Payer: Self-pay

## 2024-02-22 ENCOUNTER — Other Ambulatory Visit (HOSPITAL_COMMUNITY): Payer: Self-pay

## 2024-02-22 NOTE — Telephone Encounter (Signed)
 Copied from CRM #[id number]. Topic: Clinical - Medication Prior Auth >> Feb 22, 2024 10:37 AM [name] wrote: Reason for CRM: Rosina from [employer] is calling to speak with someone who handles authorizations,. She needs to confirm the icd-10 diagnosis code  for the Continuous Glucose Receiver (FREESTYLE LIBRE 3 READER) DEVI. Please  call [facility] customer service at [phone]

## 2024-02-22 NOTE — Telephone Encounter (Signed)
 Submitted additional info to the insurance via fax  Fax# [phone]

## 2024-02-22 NOTE — Telephone Encounter (Signed)
 Spoke with pt and daughter. They will stop by to fill out rest of forms and sign once completed we will fax it to [facility] health, verbalized understanding.

## 2024-02-23 NOTE — Telephone Encounter (Signed)
 Pharmacy Patient Advocate Encounter  Received notification from [facility] that Prior Authorization for Andrea [name] has been DENIED.  Full denial letter will be uploaded to the media tab. See denial reason below.  Not covered under Part D, but may be covered under B. (DME)

## 2024-02-24 ENCOUNTER — Other Ambulatory Visit (HOSPITAL_COMMUNITY): Payer: Self-pay

## 2024-02-27 NOTE — Telephone Encounter (Signed)
 Noted

## 2024-02-28 ENCOUNTER — Telehealth: Payer: Self-pay | Admitting: Pharmacy Technician

## 2024-02-28 ENCOUNTER — Other Ambulatory Visit (HOSPITAL_COMMUNITY): Payer: Self-pay

## 2024-02-28 NOTE — Telephone Encounter (Signed)
 Noted

## 2024-02-28 NOTE — Telephone Encounter (Signed)
 Pharmacy Patient Advocate Encounter   Received notification from Pt Calls Messages that prior authorization for Freestyle Libre 3 Plus sensors is required/requested.   Insurance verification completed.   The patient is insured through [employer]   Per test claim: Refill too soon. PA is not needed at this time. Medication was filled 02/10/2024. Next eligible fill date is 03/04/2024.

## 2024-03-02 ENCOUNTER — Other Ambulatory Visit (HOSPITAL_COMMUNITY): Payer: Self-pay

## 2024-03-09 ENCOUNTER — Other Ambulatory Visit (HOSPITAL_COMMUNITY): Payer: Self-pay

## 2024-03-26 ENCOUNTER — Telehealth: Payer: Self-pay | Admitting: Pharmacy Technician

## 2024-03-26 NOTE — Telephone Encounter (Signed)
 Pharmacy Patient Advocate Encounter   Received notification from Onbase CMM KEY that prior authorization for Ozempic  (0.25 or 0.5 MG/DOSE) 2MG /3ML pen-injectors is due for renewal.   Insurance verification completed.   The patient is insured through [employer].  Action: Medication has been discontinued. Archived Key: [id number]

## 2024-03-29 ENCOUNTER — Other Ambulatory Visit: Payer: Self-pay | Admitting: Physician Assistant

## 2024-03-29 DIAGNOSIS — E1142 Type 2 diabetes mellitus with diabetic polyneuropathy: Secondary | ICD-10-CM

## 2024-03-29 MED ORDER — ONETOUCH VERIO VI STRP: ORAL_STRIP | 0 refills | Status: AC

## 2024-03-29 NOTE — Telephone Encounter (Signed)
 Requested Prescriptions  Pending Prescriptions Disp Refills   ONETOUCH VERIO test strip 300 strip 0    Sig: Use to check blood sugar three times daily. DX E11.42     Endocrinology: Diabetes - Testing Supplies Passed - 03/29/2024  4:20 PM      Passed - Valid encounter within last 12 months    Recent Outpatient Visits           1 month ago Type 2 diabetes mellitus with hyperlipidemia [facility])   [place] [facility] [place], [place], PA-C   4 months ago PAD (peripheral artery disease)   [place] [facility] [place], [place], PA-C   7 months ago Gastroesophageal reflux disease without esophagitis   [place] [facility] [place], [place], PA-C   8 months ago Acute pain of left knee   [place] [facility] [place], [place], PA-C   10 months ago Diabetes mellitus due to underlying condition with diabetic autonomic neuropathy, unspecified whether long term insulin  use [facility])    [facility] [place], Janna, PA-C

## 2024-03-29 NOTE — Telephone Encounter (Signed)
 Copied from CRM #[id number]. Topic: Clinical - Medication Refill >> Mar 29, 2024 11:11 AM Gattis SQUIBB wrote: Medication: one touch verio test strips.. she did not like the [place] 3 reader  Has the patient contacted their pharmacy? No (Agent: If no, request that the patient contact the pharmacy for the refill. If patient does not wish to contact the pharmacy document the reason why and proceed with request.) (Agent: If yes, when and what did the pharmacy advise?)  This is the patient's preferred pharmacy:   [facility] [place]  [place], [place] - [address] [place] [address] Phone: [phone] Fax: [phone]   Is this the correct pharmacy for this prescription? Yes If no, delete pharmacy and type the correct one.   Has the prescription been filled recently? Yes  Is the patient out of the medication? Yes  Has the patient been seen for an appointment in the last year OR does the patient have an upcoming appointment? Yes  Can we respond through MyChart? No  Agent: Please be advised that Rx refills may take up to 3 business days. We ask that you follow-up with your pharmacy.

## 2024-03-30 NOTE — Telephone Encounter (Signed)
 Mail out pap [facility] to pt when calling [facility] said they do not have pt pap.SABRA

## 2024-04-04 ENCOUNTER — Other Ambulatory Visit: Payer: Self-pay | Admitting: Physician Assistant

## 2024-04-04 DIAGNOSIS — E785 Hyperlipidemia, unspecified: Secondary | ICD-10-CM

## 2024-04-11 ENCOUNTER — Ambulatory Visit
Admission: RE | Admit: 2024-04-11 | Discharge: 2024-04-11 | Disposition: A | Source: Ambulatory Visit | Attending: Physician Assistant

## 2024-04-11 ENCOUNTER — Encounter: Payer: Self-pay | Admitting: Physician Assistant

## 2024-04-11 ENCOUNTER — Ambulatory Visit: Admitting: Physician Assistant

## 2024-04-11 ENCOUNTER — Ambulatory Visit

## 2024-04-11 ENCOUNTER — Ambulatory Visit: Payer: Self-pay | Admitting: Physician Assistant

## 2024-04-11 ENCOUNTER — Ambulatory Visit
Admission: RE | Admit: 2024-04-11 | Discharge: 2024-04-11 | Disposition: A | Source: Home / Self Care | Attending: Physician Assistant | Admitting: Physician Assistant

## 2024-04-11 VITALS — BP 118/60 | HR 84 | Resp 14 | Ht 59.0 in | Wt 134.0 lb

## 2024-04-11 DIAGNOSIS — E1169 Type 2 diabetes mellitus with other specified complication: Secondary | ICD-10-CM

## 2024-04-11 DIAGNOSIS — E1142 Type 2 diabetes mellitus with diabetic polyneuropathy: Secondary | ICD-10-CM

## 2024-04-11 DIAGNOSIS — I5022 Chronic systolic (congestive) heart failure: Secondary | ICD-10-CM

## 2024-04-11 DIAGNOSIS — Z8673 Personal history of transient ischemic attack (TIA), and cerebral infarction without residual deficits: Secondary | ICD-10-CM

## 2024-04-11 DIAGNOSIS — M810 Age-related osteoporosis without current pathological fracture: Secondary | ICD-10-CM

## 2024-04-11 DIAGNOSIS — E1159 Type 2 diabetes mellitus with other circulatory complications: Secondary | ICD-10-CM

## 2024-04-11 DIAGNOSIS — F17200 Nicotine dependence, unspecified, uncomplicated: Secondary | ICD-10-CM

## 2024-04-11 DIAGNOSIS — N393 Stress incontinence (female) (male): Secondary | ICD-10-CM

## 2024-04-11 DIAGNOSIS — E559 Vitamin D deficiency, unspecified: Secondary | ICD-10-CM

## 2024-04-11 DIAGNOSIS — E785 Hyperlipidemia, unspecified: Secondary | ICD-10-CM

## 2024-04-11 DIAGNOSIS — R0989 Other specified symptoms and signs involving the circulatory and respiratory systems: Secondary | ICD-10-CM

## 2024-04-11 DIAGNOSIS — I25118 Atherosclerotic heart disease of native coronary artery with other forms of angina pectoris: Secondary | ICD-10-CM

## 2024-04-11 DIAGNOSIS — Z1211 Encounter for screening for malignant neoplasm of colon: Secondary | ICD-10-CM

## 2024-04-11 MED ORDER — OZEMPIC (0.25 OR 0.5 MG/DOSE) 2 MG/3ML ~~LOC~~ SOPN: 0.5000 mg | PEN_INJECTOR | SUBCUTANEOUS | 2 refills | Status: AC

## 2024-04-11 MED ORDER — INSULIN GLARGINE 100 UNIT/ML SOLOSTAR PEN: 18.0000 [IU] | PEN_INJECTOR | Freq: Every day | SUBCUTANEOUS | 2 refills | Status: AC

## 2024-04-11 NOTE — Progress Notes (Signed)
 "  S:     Reason for visit: ?  Andrea [name] is a 79 y.o. female with a history of diabetes (type 2), who presents today for a follow up diabetes Face to Face pharmacotherapy visit.? Pertinent PMH also includes CAD, PAD, HFrEF, HTN, GERD, HLD, hx of TIA and MI.   They were referred to the pharmacist by their PCP for assistance in managing diabetes.   Care Team: Primary Care Provider: [place], Janna, PA-C  Patient presented to clinic today with her daughter. She reports using CGM for a few weeks, but then was bothered by the various alarms for signal loss. She is not interested in staying on CGM at this time. However daughter did note some hypoglycemic events, during which time she was unaware. Confirmed hypoglycemia with fingerstick.   Current diabetes medications include: Farxiga  10 mg daily, Ozempic  0.5 mg weekly, Tresiba  22 units daily Previous diabetes medications include: Trulicity , Jardiance  Current hypertension medications include: losartan  25 mg daily, metoprolol  succinate 50 mg daily, furosemide  20 mg daily  Current hyperlipidemia medications include: atorvastatin  20 mg daily, Omega-3 1000 mg   Patient reports adherence to taking all medications as prescribed. Reports daughter helps management medications.  Have you been experiencing any side effects to the medications prescribed? no Do you have any problems obtaining medications due to transportation or finances? no Insurance coverage: [employer] + LIS  Patient denies any symptoms of hypoglycemia since last visit. Daughter reports concerns for hypoglycemic unawareness while she was using CGM. Reported some rare overnight BG readings in the 60s.   Reported home fasting blood sugars: 80-100 mg/dL  Patient reported dietary habits: Eats 2 meals/day Reports decrease in appetite DM Prevention:  Statin: Taking; moderate intensity.?  ACE/ARB: yes; losartan  History of chronic kidney disease? yes Last urinary  albumin /creatinine ratio:  Lab Results  Component Value Date   MICRALBCREAT 78 (H) 02/08/2024   MICRALBCREAT 54 (H) 12/30/2022   MICRALBCREAT 87 (H) 06/10/2022   MICRALBCREAT 138 (H) 12/10/2021   Last eye exam:  Lab Results  Component Value Date   HMDIABEYEEXA No Retinopathy 09/27/2023   Lab Results  Component Value Date   HMDIABEYEEXA No Retinopathy 09/27/2023   Last foot exam: No foot exam found Tobacco Use:  Tobacco Use: High Risk (04/11/2024)   Patient History    Smoking Tobacco Use: Every Day    Smokeless Tobacco Use: Never    Passive Exposure: Not on file   O:   Vitals:  Wt Readings from Last 3 Encounters:  04/11/24 134 lb (60.8 kg)  02/08/24 128 lb (58.1 kg)  01/18/24 128 lb (58.1 kg)   BP Readings from Last 3 Encounters:  04/11/24 118/60  02/08/24 (!) 131/55  11/03/23 138/61   Pulse Readings from Last 3 Encounters:  04/11/24 84  02/08/24 79  11/03/23 74     Labs:?  Lab Results  Component Value Date   HGBA1C 6.6 (H) 11/17/2023   HGBA1C 7.1 (H) 05/23/2023   HGBA1C 7.3 (H) 12/30/2022   GLUCOSE 96 11/17/2023   MICRALBCREAT 78 (H) 02/08/2024   MICRALBCREAT 54 (H) 12/30/2022   MICRALBCREAT 87 (H) 06/10/2022   CREATININE 1.41 (H) 11/17/2023   CREATININE 1.31 (H) 05/23/2023   CREATININE 1.48 (H) 12/30/2022    Lab Results  Component Value Date   CHOL 127 05/23/2023   LDLCALC 43 05/23/2023   LDLCALC 42 12/30/2022   LDLCALC 61 06/10/2022   HDL 62 05/23/2023   TRIG 124 05/23/2023   TRIG 141 12/30/2022  TRIG 157 (H) 06/10/2022   ALT 11 11/17/2023   ALT 10 05/23/2023   AST 14 11/17/2023   AST 10 05/23/2023      Chemistry      Component Value Date/Time   NA 142 11/17/2023 1104   NA 143 01/01/2014 0643   K 4.8 11/17/2023 1104   K 4.2 01/01/2014 0643   CL 103 11/17/2023 1104   CL 105 01/01/2014 0643   CO2 22 11/17/2023 1104   CO2 30 01/01/2014 0643   BUN 37 (H) 11/17/2023 1104   BUN 17 01/01/2014 0643   CREATININE 1.41 (H) 11/17/2023 1104    CREATININE 1.08 (H) 12/16/2016 1539   GLU 243 06/03/2021 1644      Component Value Date/Time   CALCIUM  9.7 11/17/2023 1104   CALCIUM  8.3 (L) 01/01/2014 0643   ALKPHOS 120 11/17/2023 1104   AST 14 11/17/2023 1104   ALT 11 11/17/2023 1104   BILITOT 0.5 11/17/2023 1104       The ASCVD Risk score (Arnett DK, et al., 2019) failed to calculate for the following reasons:   Risk score cannot be calculated because patient has a medical history suggesting prior/existing ASCVD   * - Cholesterol units were assumed  Lab Results  Component Value Date   MICRALBCREAT 78 (H) 02/08/2024   MICRALBCREAT 54 (H) 12/30/2022   MICRALBCREAT 87 (H) 06/10/2022   MICRALBCREAT 138 (H) 12/10/2021    A/P: Diabetes currently controlled with a most recent A1c of 6.6% on 11/17/23. 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 Additionally, it is likely that patient is holding onto her ultra-long acting insulin  given CKD dx. Additionally, preferred insulin  product on insurance is Lantus . Will also decrease insulin  dose given history of hypoglycemic unawareness and level of control.  -Switched basal insulin  Tresiba  (insulin  degludec) to Lantus  (insulin  glargine)  to 18 units daily -Continued GLP-1 Ozempic  (semaglutide ) 0.5 mg weekly.  -Continued SGLT2-I Farxiga  (dapagliflozin )10 mg daily.  -Patient educated on purpose, proper use, and potential adverse effects of insulin .  -Extensively discussed pathophysiology of diabetes, recommended lifestyle interventions, dietary effects on blood sugar control.  -Counseled on s/sx of and management of hypoglycemia.   ASCVD risk - secondary prevention in patient with diabetes. Last LDL is 43 mg/dL, at goal of <44 mg/dL.  -Continued atorvastatin  20 mg daily.   Patient verbalized understanding of treatment plan. Total time patient counseling 30 minutes.  Follow-up:  Pharmacist on 05/23/24  [name], PharmD, BCACP, CPP Clinical Pharmacist [facility] [phone]  ' "

## 2024-04-11 NOTE — Progress Notes (Unsigned)
 " Established patient visit  Patient: Andrea [name]   DOB: 06-Jul-1945   79 y.o. Female  MRN: [id number] Visit Date: 04/11/2024  Today's healthcare provider: Jolynn Spencer, PA-C   Chief Complaint  Patient presents with   Medical Management of Chronic Issues    2 month f/u  Pt noticed recently when cough she has accidental leaks x 3-4 weeks   Subjective     HPI     Medical Management of Chronic Issues    Additional comments: 2 month f/u  Pt noticed recently when cough she has accidental leaks x 3-4 weeks      Last edited by Wilfred Hargis RAMAN, CMA on 04/11/2024  1:33 PM.       Discussed the use of AI scribe software for clinical note transcription with the patient, who gave verbal consent to proceed.  History of Present Illness Andrea [name] is a 79 year old female with diabetes, hypertension, and osteoporosis who presents with worsening urinary incontinence.  She describes chronic urinary leakage that has worsened, occurring with coughing and with sudden urges to urinate. She has had three vaginal deliveries.  Her diabetes is stable on Tresiba , Ozempic , and Farxiga  with regular pharmacist follow up. Her blood pressure is well controlled on metoprolol , losartan , and furosemide . She takes atorvastatin  for cholesterol.  She continues treatment for osteoporosis.  She smokes about half a pack of cigarettes daily.       01/18/2024   10:34 AM 11/03/2023   11:02 AM 08/04/2023    1:34 PM  Depression screen PHQ 2/9  Decreased Interest 0 0 0  Down, Depressed, Hopeless 0 0 0  PHQ - 2 Score 0 0 0  Altered sleeping 0 0 0  Tired, decreased energy 0 0 0  Change in appetite 0 0 0  Feeling bad or failure about yourself   0 0  Trouble concentrating 0 0 0  Moving slowly or fidgety/restless 0 0 0  Suicidal thoughts 0 0 0  PHQ-9 Score 0 0  0   Difficult doing work/chores Not difficult at all Not difficult at all Not difficult at all     Data saved with a previous flowsheet row  definition      11/03/2023   11:02 AM 05/03/2023   10:00 AM 06/23/2021    2:52 PM  GAD 7 : Generalized Anxiety Score  Nervous, Anxious, on Edge 0  0  3   Control/stop worrying 0  0  1   Worry too much - different things 0  0  2   Trouble relaxing 0  0  0   Restless 0  0  0   Easily annoyed or irritable 0  0  1   Afraid - awful might happen 0  0  1   Total GAD 7 Score 0 0 8  Anxiety Difficulty Not difficult at all Not difficult at all Not difficult at all     Data saved with a previous flowsheet row definition    Medications: Show/hide medication list[1]  Review of Systems All negative Except see HPI   {Insert previous labs (optional):23779} {See past labs  Heme  Chem  Endocrine  Serology  Results Review (optional):1}   Objective    BP 118/60   Pulse 84   Resp 14   Ht 4' 11 (1.499 m)   Wt 134 lb (60.8 kg)   SpO2 96%   BMI 27.06 kg/m  {Insert last BP/Wt (optional):23777}{See vitals history (optional):1}  Physical Exam Vitals reviewed.  Constitutional:      General: She is not in acute distress.    Appearance: Normal appearance. She is well-developed. She is not diaphoretic.  HENT:     Head: Normocephalic and atraumatic.  Eyes:     General: No scleral icterus.    Conjunctiva/sclera: Conjunctivae normal.  Neck:     Thyroid : No thyromegaly.  Cardiovascular:     Rate and Rhythm: Normal rate and regular rhythm.     Pulses: Normal pulses.     Heart sounds: Normal heart sounds. No murmur heard. Pulmonary:     Effort: Pulmonary effort is normal. No respiratory distress.     Breath sounds: Wheezing present. No rhonchi or rales.  Musculoskeletal:     Cervical back: Neck supple.     Right lower leg: No edema.     Left lower leg: No edema.  Lymphadenopathy:     Cervical: No cervical adenopathy.  Skin:    General: Skin is warm and dry.     Findings: No rash.  Neurological:     Mental Status: She is alert and oriented to person, place, and time. Mental  status is at baseline.  Psychiatric:        Mood and Affect: Mood normal.        Behavior: Behavior normal.      No results found for any visits on 04/11/24.      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 Differential includes pelvic organ prolapse. - Recommended Kegel exercises for pelvic floor strengthening. - Referred to urogynecologist for further evaluation and management.  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 - Continue current diabetes medications: Tresiba , Ozempic , and Farxiga . - Ordered A1c test to monitor diabetes control. Continue lifestyle modifications FOLLOW-UP with in-clinic pharmacist Will follow-up  Hypertension Chronic and stable Blood pressure well-controlled with metoprolol , losartan , and furosemide . - Continue current antihypertensive medications: metoprolol , losartan , and furosemide . 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 - Continue current osteoporosis medications. Continue vit D supplements and Calcium  Will follow-up  Hyperlipidemia Chronic and stable Cholesterol management ongoing with atorvastatin . - Continue atorvastatin  for cholesterol management. - Ordered lipid panel to monitor cholesterol levels. Continue lifestyle modifications Will follow-up  Tobacco/Nicotine dependence Chronic and stable Continues to smoke approximately half a pack per day. Encouraged to quit smoking. - Encouraged smoking cessation using national quit line resources. Will follow-up  General health maintenance Routine health maintenance  discussed, including lab work and foot examination. - Ordered comprehensive metabolic panel (CMP) and lipid panel.  Hypertension associated with diabetes (HCC) (Primary)  - Hemoglobin A1c - Lipid Panel With LDL/HDL Ratio - Comprehensive metabolic panel with GFR  Chronic systolic CHF (congestive heart failure) (HCC) Atherosclerotic heart disease of native coronary artery with other forms of angina pectoris - Hemoglobin A1c - Lipid Panel With LDL/HDL Ratio - Comprehensive metabolic panel with GFR  Type 2 diabetes mellitus with diabetic polyneuropathy, without long-term current use of insulin  (HCC) - Hemoglobin A1c - Lipid Panel With LDL/HDL Ratio - HM Diabetes Foot Exam  5. Age-related osteoporosis without current pathological fracture ***  6. Vitamin D  deficiency ***  7. Tobacco dependence ***  8. Personal history of transient ischemic attack (TIA),  and cerebral infarction without residual deficits ***  9. Hyperlipidemia LDL goal <70 ***  10. Colon cancer screening ***  Stress incontinence of urine - Ambulatory referral to Urogynecology  Abnormal lung sounds - DG Chest 2 View  No orders of the defined types were placed in this encounter.   No follow-ups on file.   The patient was advised to call back or seek an in-person evaluation if the symptoms worsen or if the condition fails to improve as anticipated.  I discussed the assessment and treatment plan with the patient. The patient was provided an opportunity to ask questions and all were answered. The patient agreed with the plan and demonstrated an understanding of the instructions.  I, Abdurahman Rugg, PA-C have reviewed all documentation for this visit. The documentation on 04/11/2024  for the exam, diagnosis, procedures, and orders are all accurate and complete.  Jolynn Spencer, [facility], MMS [facility] [phone] (phone) [phone] (fax)  [facility]     [1]  Outpatient  Medications Prior to Visit  Medication Sig   alendronate  (FOSAMAX ) 70 MG tablet TAKE 1 TABLET BY MOUTH EVERY 7 DAYS WITH A FULL GLASS OF WATER ON AN EMPTY STOMACH   atorvastatin  (LIPITOR ) 20 MG tablet Take 1 tablet (20 mg total) by mouth daily.   Calcium  Carbonate-Vitamin D  600-400 MG-UNIT tablet Take 1 tablet by mouth daily.   Cholecalciferol  25 MCG (1000 UT) capsule Take 1,000 Units by mouth daily.   Continuous Glucose Receiver (FREESTYLE LIBRE 3 READER) DEVI Use to monitor blood sugar with Freestyle Libre 3+ sensors   Continuous Glucose Sensor (FREESTYLE LIBRE 3 PLUS SENSOR) MISC Change sensor every 15 days.   Cyanocobalamin  (B-12) 1000 MCG CAPS Take 1,000 mcg by mouth daily.   dapagliflozin  propanediol (FARXIGA ) 10 MG TABS tablet Take 10 mg by mouth daily. Receives through [place] patient [place]   furosemide  (LASIX ) 20 MG tablet TAKE 1 TABLET (20 MG TOTAL) BY MOUTH DAILY.   insulin  degludec (TRESIBA  FLEXTOUCH) 100 UNIT/ML FlexTouch Pen Inject 22 Units into the skin daily.   Insulin  Pen Needle (TRUEPLUS PEN NEEDLES) 31G X 6 MM MISC Use with lantus  two times daily.   losartan  (COZAAR ) 25 MG tablet TAKE 1 TABLET BY MOUTH DAILY   metoprolol  succinate (TOPROL -XL) 50 MG 24 hr tablet Take 1 tablet (50 mg total) by mouth daily.   montelukast  (SINGULAIR ) 10 MG tablet TAKE 1 TABLET (10 MG TOTAL) BY MOUTH AT BEDTIME.   Multiple Vitamins-Minerals (PRESERVISION AREDS 2 PO) Take 1 tablet by mouth in the morning and at bedtime.   Omega-3 1000 MG CAPS Take 1,000 mg by mouth daily.   omeprazole  (PRILOSEC) 40 MG capsule TAKE 1 CAPSULE BY MOUTH DAILY   OneTouch Delica Lancets 33G MISC To check blood sugar three times daily  DX: E11.42   ONETOUCH VERIO test strip Use to check blood sugar three times daily. DX E11.42   oxybutynin  (DITROPAN -XL) 10 MG 24 hr tablet TAKE 1 TABLET (10 MG TOTAL) BY MOUTH AT BEDTIME.   Semaglutide ,0.25 or 0.5MG /DOS, (OZEMPIC , 0.25 OR 0.5 MG/DOSE,) 2 MG/3ML SOPN Inject 0.5 mg  into the skin once a week. Patient receives via [employer] Patient Assistance through December 2024   sertraline  (ZOLOFT ) 25 MG tablet TAKE 1 TABLET BY MOUTH DAILY   No facility-administered medications prior to visit.   "

## 2024-04-12 DIAGNOSIS — R0989 Other specified symptoms and signs involving the circulatory and respiratory systems: Secondary | ICD-10-CM | POA: Insufficient documentation

## 2024-04-12 DIAGNOSIS — N393 Stress incontinence (female) (male): Secondary | ICD-10-CM | POA: Insufficient documentation

## 2024-04-12 LAB — COMPREHENSIVE METABOLIC PANEL WITH GFR
ALT: 8 [IU]/L (ref 0–32)
AST: 10 [IU]/L (ref 0–40)
Albumin: 3.9 g/dL (ref 3.8–4.8)
Alkaline Phosphatase: 97 [IU]/L (ref 49–135)
BUN/Creatinine Ratio: 23 (ref 12–28)
BUN: 23 mg/dL (ref 8–27)
Bilirubin Total: 0.6 mg/dL (ref 0.0–1.2)
CO2: 26 mmol/L (ref 20–29)
Calcium: 9.2 mg/dL (ref 8.7–10.3)
Chloride: 103 mmol/L (ref 96–106)
Creatinine, Ser: 1.02 mg/dL — ABNORMAL HIGH (ref 0.57–1.00)
Globulin, Total: 2.4 g/dL (ref 1.5–4.5)
Glucose: 128 mg/dL — ABNORMAL HIGH (ref 70–99)
Potassium: 4.6 mmol/L (ref 3.5–5.2)
Sodium: 142 mmol/L (ref 134–144)
Total Protein: 6.3 g/dL (ref 6.0–8.5)
eGFR: 56 mL/min/{1.73_m2} — ABNORMAL LOW

## 2024-04-12 LAB — HEMOGLOBIN A1C
Est. average glucose Bld gHb Est-mCnc: 140 mg/dL
Hgb A1c MFr Bld: 6.5 % — ABNORMAL HIGH (ref 4.8–5.6)

## 2024-04-12 LAB — LIPID PANEL WITH LDL/HDL RATIO
Cholesterol, Total: 144 mg/dL (ref 100–199)
HDL: 66 mg/dL
LDL Chol Calc (NIH): 45 mg/dL (ref 0–99)
LDL/HDL Ratio: 0.7 ratio (ref 0.0–3.2)
Triglycerides: 210 mg/dL — ABNORMAL HIGH (ref 0–149)
VLDL Cholesterol Cal: 33 mg/dL (ref 5–40)

## 2024-04-12 NOTE — Telephone Encounter (Signed)
 Gave [employer] a call to follow up on pt's application ,per representative they explain they still need pt portion of application they have provider's portion,I will faxed pt portion Nov[employer]  to [facility] she mention pt is coming to office and will sign application then.

## 2024-05-23 ENCOUNTER — Ambulatory Visit

## 2025-01-29 ENCOUNTER — Ambulatory Visit
